# Patient Record
Sex: Male | Born: 1955
Health system: Southern US, Community
[De-identification: ages and names within clinical notes are randomized; demographics above are authoritative.]

## PROBLEM LIST (undated history)

## (undated) DIAGNOSIS — F3289 Other specified depressive episodes: Secondary | ICD-10-CM

## (undated) DIAGNOSIS — E11359 Type 2 diabetes mellitus with proliferative diabetic retinopathy without macular edema: Secondary | ICD-10-CM

## (undated) DIAGNOSIS — F32A Depression, unspecified: Secondary | ICD-10-CM

## (undated) DIAGNOSIS — T401X1A Poisoning by heroin, accidental (unintentional), initial encounter: Secondary | ICD-10-CM

## (undated) DIAGNOSIS — F1910 Other psychoactive substance abuse, uncomplicated: Secondary | ICD-10-CM

## (undated) DIAGNOSIS — I10 Essential (primary) hypertension: Secondary | ICD-10-CM

## (undated) DIAGNOSIS — E119 Type 2 diabetes mellitus without complications: Secondary | ICD-10-CM

## (undated) DIAGNOSIS — F1190 Opioid use, unspecified, uncomplicated: Secondary | ICD-10-CM

## (undated) DIAGNOSIS — F329 Major depressive disorder, single episode, unspecified: Secondary | ICD-10-CM

## (undated) DIAGNOSIS — B1710 Acute hepatitis C without hepatic coma: Secondary | ICD-10-CM

## (undated) DIAGNOSIS — B1920 Unspecified viral hepatitis C without hepatic coma: Secondary | ICD-10-CM

## (undated) DIAGNOSIS — E109 Type 1 diabetes mellitus without complications: Secondary | ICD-10-CM

## (undated) DIAGNOSIS — L80 Vitiligo: Secondary | ICD-10-CM

## (undated) HISTORY — DX: Major depressive disorder, single episode, unspecified: F32.9

## (undated) HISTORY — DX: Type 1 diabetes mellitus without complications: E10.9

## (undated) HISTORY — DX: Essential (primary) hypertension: I10

## (undated) HISTORY — DX: Type 2 diabetes mellitus with proliferative diabetic retinopathy without macular edema: E11.359

## (undated) HISTORY — DX: Vitiligo: L80

## (undated) HISTORY — DX: Other psychoactive substance abuse, uncomplicated: F19.10

## (undated) HISTORY — PX: EYE SURGERY: SHX253

## (undated) HISTORY — DX: Other specified depressive episodes: F32.89

## (undated) HISTORY — DX: Acute hepatitis C without hepatic coma: B17.10

---
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
## 2002-10-06 ENCOUNTER — Ambulatory Visit (HOSPITAL_COMMUNITY): Admission: RE | Admit: 2002-10-06 | Discharge: 2002-10-06 | Payer: Self-pay | Admitting: Gastroenterology

## 2002-10-06 ENCOUNTER — Encounter: Payer: Self-pay | Admitting: Gastroenterology

## 2002-10-18 ENCOUNTER — Ambulatory Visit (HOSPITAL_COMMUNITY): Admission: RE | Admit: 2002-10-18 | Discharge: 2002-10-18 | Payer: Self-pay | Admitting: *Deleted

## 2003-03-14 ENCOUNTER — Ambulatory Visit (HOSPITAL_COMMUNITY): Admission: RE | Admit: 2003-03-14 | Discharge: 2003-03-14 | Payer: Self-pay | Admitting: *Deleted

## 2003-04-15 ENCOUNTER — Emergency Department (HOSPITAL_COMMUNITY): Admission: EM | Admit: 2003-04-15 | Discharge: 2003-04-15 | Payer: Self-pay | Admitting: Emergency Medicine

## 2004-02-08 ENCOUNTER — Ambulatory Visit: Payer: Self-pay | Admitting: Family Medicine

## 2004-02-13 ENCOUNTER — Ambulatory Visit: Payer: Self-pay | Admitting: Family Medicine

## 2004-03-13 ENCOUNTER — Ambulatory Visit: Payer: Self-pay | Admitting: Family Medicine

## 2004-04-17 ENCOUNTER — Ambulatory Visit: Payer: Self-pay | Admitting: Family Medicine

## 2004-05-07 ENCOUNTER — Ambulatory Visit: Payer: Self-pay | Admitting: Family Medicine

## 2004-05-08 ENCOUNTER — Ambulatory Visit: Payer: Self-pay | Admitting: *Deleted

## 2004-05-16 ENCOUNTER — Ambulatory Visit: Payer: Self-pay | Admitting: Family Medicine

## 2004-05-28 ENCOUNTER — Ambulatory Visit: Payer: Self-pay | Admitting: Gastroenterology

## 2004-06-04 ENCOUNTER — Ambulatory Visit: Payer: Self-pay | Admitting: Family Medicine

## 2004-06-15 ENCOUNTER — Ambulatory Visit (HOSPITAL_COMMUNITY): Admission: RE | Admit: 2004-06-15 | Discharge: 2004-06-15 | Payer: Self-pay | Admitting: Gastroenterology

## 2004-06-20 ENCOUNTER — Ambulatory Visit: Payer: Self-pay | Admitting: Family Medicine

## 2004-07-06 ENCOUNTER — Ambulatory Visit: Payer: Self-pay | Admitting: Family Medicine

## 2004-07-10 ENCOUNTER — Emergency Department (HOSPITAL_COMMUNITY): Admission: EM | Admit: 2004-07-10 | Discharge: 2004-07-10 | Payer: Self-pay | Admitting: Emergency Medicine

## 2004-07-13 ENCOUNTER — Ambulatory Visit: Payer: Self-pay | Admitting: Family Medicine

## 2004-07-20 ENCOUNTER — Ambulatory Visit: Payer: Self-pay | Admitting: Family Medicine

## 2004-08-01 ENCOUNTER — Ambulatory Visit: Payer: Self-pay | Admitting: Family Medicine

## 2004-08-27 ENCOUNTER — Ambulatory Visit: Payer: Self-pay | Admitting: Internal Medicine

## 2004-10-18 ENCOUNTER — Ambulatory Visit: Payer: Self-pay | Admitting: Internal Medicine

## 2004-12-15 ENCOUNTER — Emergency Department (HOSPITAL_COMMUNITY): Admission: EM | Admit: 2004-12-15 | Discharge: 2004-12-15 | Payer: Self-pay | Admitting: Emergency Medicine

## 2005-01-17 ENCOUNTER — Ambulatory Visit: Payer: Self-pay | Admitting: Family Medicine

## 2005-02-15 ENCOUNTER — Ambulatory Visit: Payer: Self-pay | Admitting: Gastroenterology

## 2005-02-23 ENCOUNTER — Encounter (INDEPENDENT_AMBULATORY_CARE_PROVIDER_SITE_OTHER): Payer: Self-pay | Admitting: Specialist

## 2005-02-23 ENCOUNTER — Inpatient Hospital Stay (HOSPITAL_COMMUNITY): Admission: EM | Admit: 2005-02-23 | Discharge: 2005-02-28 | Payer: Self-pay | Admitting: Emergency Medicine

## 2005-04-19 ENCOUNTER — Emergency Department (HOSPITAL_COMMUNITY): Admission: EM | Admit: 2005-04-19 | Discharge: 2005-04-19 | Payer: Self-pay | Admitting: Emergency Medicine

## 2005-05-16 ENCOUNTER — Emergency Department (HOSPITAL_COMMUNITY): Admission: EM | Admit: 2005-05-16 | Discharge: 2005-05-16 | Payer: Self-pay | Admitting: Emergency Medicine

## 2005-05-19 ENCOUNTER — Emergency Department (HOSPITAL_COMMUNITY): Admission: EM | Admit: 2005-05-19 | Discharge: 2005-05-19 | Payer: Self-pay | Admitting: Emergency Medicine

## 2005-06-07 ENCOUNTER — Ambulatory Visit: Payer: Self-pay | Admitting: Family Medicine

## 2005-07-29 ENCOUNTER — Ambulatory Visit: Payer: Self-pay | Admitting: Family Medicine

## 2005-08-12 ENCOUNTER — Emergency Department (HOSPITAL_COMMUNITY): Admission: EM | Admit: 2005-08-12 | Discharge: 2005-08-12 | Payer: Self-pay | Admitting: Emergency Medicine

## 2005-09-25 ENCOUNTER — Ambulatory Visit: Payer: Self-pay | Admitting: Family Medicine

## 2005-11-15 ENCOUNTER — Ambulatory Visit: Payer: Self-pay | Admitting: Gastroenterology

## 2005-11-19 ENCOUNTER — Ambulatory Visit: Payer: Self-pay | Admitting: Family Medicine

## 2005-12-13 ENCOUNTER — Ambulatory Visit: Payer: Self-pay | Admitting: Endocrinology

## 2006-01-03 ENCOUNTER — Ambulatory Visit: Payer: Self-pay | Admitting: Endocrinology

## 2006-02-20 ENCOUNTER — Ambulatory Visit: Payer: Self-pay | Admitting: Endocrinology

## 2006-03-04 ENCOUNTER — Ambulatory Visit: Payer: Self-pay | Admitting: Gastroenterology

## 2006-05-22 ENCOUNTER — Ambulatory Visit: Payer: Self-pay | Admitting: Endocrinology

## 2006-06-24 ENCOUNTER — Emergency Department (HOSPITAL_COMMUNITY): Admission: EM | Admit: 2006-06-24 | Discharge: 2006-06-24 | Payer: Self-pay | Admitting: Emergency Medicine

## 2006-09-25 ENCOUNTER — Ambulatory Visit: Payer: Self-pay | Admitting: Endocrinology

## 2006-09-30 ENCOUNTER — Ambulatory Visit: Payer: Self-pay | Admitting: Gastroenterology

## 2006-11-11 ENCOUNTER — Encounter: Payer: Self-pay | Admitting: Endocrinology

## 2006-11-11 DIAGNOSIS — E109 Type 1 diabetes mellitus without complications: Secondary | ICD-10-CM

## 2006-11-11 DIAGNOSIS — F329 Major depressive disorder, single episode, unspecified: Secondary | ICD-10-CM

## 2006-11-19 ENCOUNTER — Ambulatory Visit: Payer: Self-pay | Admitting: Endocrinology

## 2006-11-19 LAB — CONVERTED CEMR LAB
BUN: 10 mg/dL (ref 6–23)
CO2: 36 meq/L — ABNORMAL HIGH (ref 19–32)
Calcium: 10.3 mg/dL (ref 8.4–10.5)
Chloride: 107 meq/L (ref 96–112)
Cholesterol: 163 mg/dL (ref 0–200)
Creatinine, Ser: 0.7 mg/dL (ref 0.4–1.5)
GFR calc Af Amer: 154 mL/min
GFR calc non Af Amer: 127 mL/min
Glucose, Bld: 113 mg/dL — ABNORMAL HIGH (ref 70–99)
HDL: 56.4 mg/dL (ref >39.0)
Hgb A1c MFr Bld: 8.4 % — ABNORMAL HIGH (ref 4.6–6.0)
LDL Cholesterol: 98 mg/dL (ref 0–99)
PSA: 0.36 ng/mL (ref 0.10–4.00)
Potassium: 5 meq/L (ref 3.5–5.1)
Sodium: 145 meq/L (ref 135–145)
TSH: 2.34 microintl units/mL (ref 0.35–5.50)
Total CHOL/HDL Ratio: 2.9
Triglycerides: 44 mg/dL (ref 0–149)
VLDL: 9 mg/dL (ref 0–40)

## 2007-04-06 ENCOUNTER — Ambulatory Visit: Payer: Self-pay | Admitting: Internal Medicine

## 2007-04-23 ENCOUNTER — Ambulatory Visit: Payer: Self-pay | Admitting: Endocrinology

## 2007-04-23 DIAGNOSIS — F1910 Other psychoactive substance abuse, uncomplicated: Secondary | ICD-10-CM

## 2007-06-04 ENCOUNTER — Encounter: Payer: Self-pay | Admitting: Endocrinology

## 2007-06-28 ENCOUNTER — Emergency Department (HOSPITAL_COMMUNITY): Admission: EM | Admit: 2007-06-28 | Discharge: 2007-06-28 | Payer: Self-pay | Admitting: Emergency Medicine

## 2007-07-16 ENCOUNTER — Ambulatory Visit: Payer: Self-pay | Admitting: Endocrinology

## 2007-07-16 DIAGNOSIS — Z9119 Patient's noncompliance with other medical treatment and regimen: Secondary | ICD-10-CM

## 2007-07-16 LAB — CONVERTED CEMR LAB: Hgb A1c MFr Bld: 10.7 % — ABNORMAL HIGH (ref 4.6–6.0)

## 2007-07-20 ENCOUNTER — Emergency Department (HOSPITAL_COMMUNITY): Admission: EM | Admit: 2007-07-20 | Discharge: 2007-07-20 | Payer: Self-pay | Admitting: Emergency Medicine

## 2007-10-15 ENCOUNTER — Encounter: Payer: Self-pay | Admitting: Endocrinology

## 2007-10-27 ENCOUNTER — Emergency Department (HOSPITAL_COMMUNITY): Admission: EM | Admit: 2007-10-27 | Discharge: 2007-10-27 | Payer: Self-pay | Admitting: Emergency Medicine

## 2008-02-25 ENCOUNTER — Emergency Department (HOSPITAL_COMMUNITY): Admission: EM | Admit: 2008-02-25 | Discharge: 2008-02-25 | Payer: Self-pay | Admitting: Emergency Medicine

## 2008-04-11 ENCOUNTER — Emergency Department (HOSPITAL_COMMUNITY): Admission: EM | Admit: 2008-04-11 | Discharge: 2008-04-11 | Payer: Self-pay | Admitting: Emergency Medicine

## 2008-11-22 ENCOUNTER — Ambulatory Visit: Payer: Self-pay | Admitting: Endocrinology

## 2008-11-22 DIAGNOSIS — B182 Chronic viral hepatitis C: Secondary | ICD-10-CM | POA: Insufficient documentation

## 2008-11-22 DIAGNOSIS — R809 Proteinuria, unspecified: Secondary | ICD-10-CM | POA: Insufficient documentation

## 2008-11-22 DIAGNOSIS — L80 Vitiligo: Secondary | ICD-10-CM

## 2008-11-22 LAB — CONVERTED CEMR LAB
Creatinine,U: 163.8 mg/dL
Hgb A1c MFr Bld: 8.4 % — ABNORMAL HIGH (ref 4.6–6.5)
Microalb Creat Ratio: 238.1 mg/g — ABNORMAL HIGH (ref 0.0–30.0)
Microalb, Ur: 39 mg/dL — ABNORMAL HIGH (ref 0.0–1.9)

## 2008-11-25 ENCOUNTER — Encounter: Payer: Self-pay | Admitting: Endocrinology

## 2009-02-09 ENCOUNTER — Ambulatory Visit: Payer: Self-pay | Admitting: Internal Medicine

## 2009-02-09 ENCOUNTER — Encounter: Payer: Self-pay | Admitting: Family Medicine

## 2009-02-09 LAB — CONVERTED CEMR LAB: Microalb, Ur: 10.83 mg/dL — ABNORMAL HIGH (ref 0.00–1.89)

## 2009-04-13 ENCOUNTER — Encounter: Payer: Self-pay | Admitting: Family Medicine

## 2009-04-13 ENCOUNTER — Ambulatory Visit: Payer: Self-pay | Admitting: Internal Medicine

## 2009-04-13 LAB — CONVERTED CEMR LAB
ALT: 73 units/L — ABNORMAL HIGH (ref 0–53)
AST: 59 units/L — ABNORMAL HIGH (ref 0–37)
Albumin: 4.1 g/dL (ref 3.5–5.2)
Alkaline Phosphatase: 82 units/L (ref 39–117)
Amphetamine Screen, Ur: NEGATIVE
BUN: 15 mg/dL (ref 6–23)
Barbiturate Quant, Ur: NEGATIVE
Basophils Absolute: 0.1 10*3/uL (ref 0.0–0.1)
Basophils Relative: 2 % — ABNORMAL HIGH (ref 0–1)
Benzodiazepines.: NEGATIVE
CO2: 23 meq/L (ref 19–32)
Calcium: 9.5 mg/dL (ref 8.4–10.5)
Chloride: 105 meq/L (ref 96–112)
Cholesterol: 194 mg/dL (ref 0–200)
Cocaine Metabolites: POSITIVE — AB
Creatinine, Ser: 0.89 mg/dL (ref 0.40–1.50)
Creatinine,U: 131.2 mg/dL
Eosinophils Absolute: 0.5 10*3/uL (ref 0.0–0.7)
Eosinophils Relative: 6 % — ABNORMAL HIGH (ref 0–5)
Glucose, Bld: 67 mg/dL — ABNORMAL LOW (ref 70–99)
HCT: 48 % (ref 39.0–52.0)
HDL: 55 mg/dL (ref >39)
Hemoglobin: 17.2 g/dL — ABNORMAL HIGH (ref 13.0–17.0)
LDL Cholesterol: 122 mg/dL — ABNORMAL HIGH (ref 0–99)
Lymphocytes Relative: 32 % (ref 12–46)
Lymphs Abs: 2.5 10*3/uL (ref 0.7–4.0)
MCHC: 35.8 g/dL (ref 30.0–36.0)
MCV: 93.4 fL (ref 78.0–100.0)
Marijuana Metabolite: NEGATIVE
Methadone: NEGATIVE
Microalb, Ur: 14.12 mg/dL — ABNORMAL HIGH (ref 0.00–1.89)
Monocytes Absolute: 0.6 10*3/uL (ref 0.1–1.0)
Monocytes Relative: 8 % (ref 3–12)
Neutro Abs: 4 10*3/uL (ref 1.7–7.7)
Neutrophils Relative %: 52 % (ref 43–77)
Opiate Screen, Urine: NEGATIVE
Phencyclidine (PCP): NEGATIVE
Platelets: 299 10*3/uL (ref 150–400)
Potassium: 4.6 meq/L (ref 3.5–5.3)
Propoxyphene: NEGATIVE
RBC: 5.14 M/uL (ref 4.22–5.81)
RDW: 13.3 % (ref 11.5–15.5)
Sodium: 141 meq/L (ref 135–145)
TSH: 3.094 microintl units/mL (ref 0.350–4.500)
Total Bilirubin: 0.8 mg/dL (ref 0.3–1.2)
Total CHOL/HDL Ratio: 3.5
Total Protein: 7.1 g/dL (ref 6.0–8.3)
Triglycerides: 86 mg/dL (ref <150)
VLDL: 17 mg/dL (ref 0–40)
WBC: 7.7 10*3/uL (ref 4.0–10.5)

## 2009-05-03 ENCOUNTER — Emergency Department (HOSPITAL_COMMUNITY): Admission: EM | Admit: 2009-05-03 | Discharge: 2009-05-03 | Payer: Self-pay | Admitting: Emergency Medicine

## 2009-05-04 ENCOUNTER — Ambulatory Visit: Payer: Self-pay | Admitting: Internal Medicine

## 2009-05-05 ENCOUNTER — Ambulatory Visit: Payer: Self-pay | Admitting: Internal Medicine

## 2009-05-05 LAB — CONVERTED CEMR LAB
Amphetamine Screen, Ur: NEGATIVE
Barbiturate Quant, Ur: NEGATIVE
Benzodiazepines.: NEGATIVE
Cholesterol: 178 mg/dL (ref 0–200)
Cocaine Metabolites: POSITIVE — AB
Creatinine,U: 74.8 mg/dL
HDL: 56 mg/dL (ref >39)
LDL Cholesterol: 107 mg/dL — ABNORMAL HIGH (ref 0–99)
Marijuana Metabolite: NEGATIVE
Methadone: NEGATIVE
Opiate Screen, Urine: NEGATIVE
Phencyclidine (PCP): NEGATIVE
Propoxyphene: NEGATIVE
Total CHOL/HDL Ratio: 3.2
Triglycerides: 76 mg/dL (ref <150)
VLDL: 15 mg/dL (ref 0–40)

## 2009-05-24 ENCOUNTER — Ambulatory Visit: Payer: Self-pay | Admitting: Internal Medicine

## 2009-10-19 ENCOUNTER — Emergency Department (HOSPITAL_COMMUNITY)
Admission: EM | Admit: 2009-10-19 | Discharge: 2009-10-19 | Payer: Self-pay | Source: Home / Self Care | Admitting: Emergency Medicine

## 2009-10-21 ENCOUNTER — Emergency Department (HOSPITAL_COMMUNITY): Admission: EM | Admit: 2009-10-21 | Discharge: 2009-10-21 | Payer: Self-pay | Admitting: Emergency Medicine

## 2009-11-20 ENCOUNTER — Ambulatory Visit: Payer: Self-pay | Admitting: Psychiatry

## 2009-11-20 ENCOUNTER — Other Ambulatory Visit: Payer: Self-pay

## 2009-11-20 ENCOUNTER — Other Ambulatory Visit: Payer: Self-pay | Admitting: Emergency Medicine

## 2009-11-20 ENCOUNTER — Inpatient Hospital Stay (HOSPITAL_COMMUNITY): Admission: RE | Admit: 2009-11-20 | Discharge: 2009-11-24 | Payer: Self-pay | Admitting: Psychiatry

## 2010-01-11 ENCOUNTER — Telehealth: Payer: Self-pay | Admitting: Internal Medicine

## 2010-01-11 ENCOUNTER — Ambulatory Visit: Payer: Self-pay | Admitting: Internal Medicine

## 2010-01-11 LAB — CONVERTED CEMR LAB
ALT: 38 units/L (ref 0–53)
AST: 33 units/L (ref 0–37)
Albumin: 3.8 g/dL (ref 3.5–5.2)
Alkaline Phosphatase: 77 units/L (ref 39–117)
BUN: 13 mg/dL (ref 6–23)
Bilirubin, Direct: 0.2 mg/dL (ref 0.0–0.3)
CO2: 28 meq/L (ref 19–32)
Calcium: 9.5 mg/dL (ref 8.4–10.5)
Chloride: 103 meq/L (ref 96–112)
Cholesterol: 188 mg/dL (ref 0–200)
Creatinine, Ser: 0.9 mg/dL (ref 0.4–1.5)
Creatinine,U: 88 mg/dL
GFR calc non Af Amer: 96.18 mL/min (ref >60)
Glucose, Bld: 360 mg/dL — ABNORMAL HIGH (ref 70–99)
HDL: 71.2 mg/dL (ref >39.00)
Hgb A1c MFr Bld: 8.9 % — ABNORMAL HIGH (ref 4.6–6.5)
LDL Cholesterol: 104 mg/dL — ABNORMAL HIGH (ref 0–99)
Microalb Creat Ratio: 4.4 mg/g (ref 0.0–30.0)
Microalb, Ur: 3.9 mg/dL — ABNORMAL HIGH (ref 0.0–1.9)
Potassium: 5 meq/L (ref 3.5–5.1)
Sodium: 136 meq/L (ref 135–145)
Total Bilirubin: 1.2 mg/dL (ref 0.3–1.2)
Total CHOL/HDL Ratio: 3
Total Protein: 7.1 g/dL (ref 6.0–8.3)
Triglycerides: 64 mg/dL (ref 0.0–149.0)
VLDL: 12.8 mg/dL (ref 0.0–40.0)

## 2010-01-11 LAB — HM DIABETES FOOT EXAM

## 2010-01-29 ENCOUNTER — Telehealth: Payer: Self-pay | Admitting: Internal Medicine

## 2010-01-30 ENCOUNTER — Ambulatory Visit: Payer: Self-pay | Admitting: Internal Medicine

## 2010-01-30 ENCOUNTER — Ambulatory Visit: Payer: Self-pay | Admitting: Endocrinology

## 2010-01-30 DIAGNOSIS — L919 Hypertrophic disorder of the skin, unspecified: Secondary | ICD-10-CM

## 2010-01-30 DIAGNOSIS — L909 Atrophic disorder of skin, unspecified: Secondary | ICD-10-CM | POA: Insufficient documentation

## 2010-02-16 ENCOUNTER — Encounter: Payer: Self-pay | Admitting: Internal Medicine

## 2010-02-16 ENCOUNTER — Encounter: Payer: Self-pay | Admitting: Endocrinology

## 2010-03-03 ENCOUNTER — Emergency Department (HOSPITAL_COMMUNITY): Admission: EM | Admit: 2010-03-03 | Discharge: 2010-03-03 | Payer: Self-pay | Admitting: Emergency Medicine

## 2010-04-10 ENCOUNTER — Emergency Department (HOSPITAL_COMMUNITY)
Admission: EM | Admit: 2010-04-10 | Discharge: 2010-04-10 | Payer: Self-pay | Source: Home / Self Care | Admitting: Emergency Medicine

## 2010-05-15 NOTE — Assessment & Plan Note (Signed)
Summary: MOLE ON LEFT EYE-LB   Vital Signs:  Patient profile:   55 year old male Height:      66 inches (167.64 cm) Weight:      161 pounds (73.18 kg) O2 Sat:      96 % on Room air Temp:     97.2 degrees F (36.22 degrees C) oral Pulse rate:   88 / minute BP sitting:   120 / 62  (left arm) Cuff size:   regular  Vitals Entered By: Tomma Lightning, RMA  O2 Flow:  Room air CC: Mole on (L) eye Is Patient Diabetic? Yes Did you bring your meter with you today? No Pain Assessment Patient in pain? no        Primary Care Provider:  Rowe Clack MD  CC:  Mole on (L) eye.  History of Present Illness: here for growth on left upper eyelid onset 4 months ago ?slow increase in size - no pain or eyelid swelling wants it removed -  reviewed chronic med issues: 1) DM1 - dx age 26y - on insulin tx since dx - noncompliant with check cbgs due to cost of strips - denies symptoms hypoglycemia - reports compliance with ongoing medical treatment and no changes in medication dose or frequency. denies adverse side effects related to current therapy.  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 20 Units Each Am Qnd Sliding Scale Ac  Three Times A Day 2)  Lantus Solostar 100 Unit/ml  Soln (Insulin Glargine) .... 27 Units Subcutaneously Every Evening 3)  Onetouch Ultra Test  Strp  (Glucose Blood) .... 4 Times A Day 4)  Lisinopril 10 Mg Tabs (Lisinopril) .[name].. 1 By Mouth Once Daily  Allergies (verified): 1)  ! Codeine  Past History:  Past Medical History: Hepatitis C chronic - declines interferon DM Perpheral Painful Neuropathy DM Prolif Retinopathy s/p laser x 2 Glaucoma Vitiligo ongoing DRUG ABUSE (pt should have NO controlled substances rx'ed)  DIABETES MELLITUS, TYPE I DEPRESSION  Md roster: [facility]o - [facility]   Review of Systems  The patient denies fever, vision loss, and headaches.    Physical Exam  General:  thin, alert, well-developed, well-nourished, and cooperative to examination.    Eyes:  skin tag or cutaneous horn (8mm) on left upper eyelid -  Skin:  see "eye" above, vitaligo changes as prior   Impression & Recommendations:  Problem # 1:  SKIN TAG (ICD-701.9) due to location on upper eyelid, i declined to remove - also discussed with my partners who agree to not intervene for same i reassured pt re: cosmetic issue only, no health issue pt still wants removed - i rec optho - has upcoming appt 11/4 for DM eye check already scheduled  Complete Medication List: 1)  Humalog Claiborne Rigg  100 Unit/ml Soln (Insulin lispro (human)) .... 20 units each am qnd sliding scale ac  three times a day 2)  Lantus Solostar 100 Unit/ml Soln (Insulin glargine) .... 27 units subcutaneously every evening 3)  Onetouch Ultra Test Strp (Glucose blood) .... 4 times a day 4)  Lisinopril 10 Mg Tabs (Lisinopril) .[name].. 1 by mouth once daily  Patient Instructions: 1)  it was good to see you today. 2)  no one here is comfortable with removal of the skin tag from your eyelid - discuss with dr. Herbert Deaner at your visit with her 02/16/10 at 215pm   Orders Added: 1)  Est. Patient Level III [id number]

## 2010-05-15 NOTE — Progress Notes (Signed)
Summary: FYI - no home phone (temp)  Phone Note Call from Patient   Caller: Patient Details for Reason: FYI Summary of Call: Pt states at this time does'nt have home number. In the process of moving. Will call back today to give [place] a correct contact #. Initial call taken by: Tomma Lightning RMA,  January 11, 2010 10:30 AM  Follow-up for Phone Call        ok - noted - thanks Follow-up by: Rowe Clack MD,  January 11, 2010 11:27 AM

## 2010-05-15 NOTE — Letter (Signed)
Summary: [facility]  [facility]   Imported By: Bubba Hales 03/01/2010 10:19:56  _____________________________________________________________________  External Attachment:    Type:   Image     Comment:   External Document

## 2010-05-15 NOTE — Progress Notes (Signed)
Summary: ophthalmology referral  Phone Note Outgoing Call   Summary of Call: Dr Asa Lente , have been unable to reach pt, has called [facility] number several times, pt no longer stay at that number but have left msg with person. See phone note dated 01/11/10.  mary phillips  Follow-up for Phone Call        ok - noted - thanks Follow-up by: Rowe Clack MD,  January 29, 2010 12:21 PM

## 2010-05-15 NOTE — Letter (Signed)
Summary: Eye exam/[facility]  Eye exam/[facility]   Imported By: Phillis Knack 03/07/2010 12:10:33  _____________________________________________________________________  External Attachment:    Type:   Image     Comment:   External Document

## 2010-05-15 NOTE — Assessment & Plan Note (Signed)
Summary: flu vac  sae--stc  Nurse Visit   Allergies: 1)  ! Codeine  Orders Added: 1)  Flu Vaccine 56yrs + MEDICARE PATIENTS [Q2039] 2)  Administration Flu vaccine - MCR [id number] Flu Vaccine Consent Questions     Do you have a history of severe allergic reactions to this vaccine? no    Any prior history of allergic reactions to egg and/or gelatin? no    Do you have a sensitivity to the preservative Thimersol? no    Do you have a past history of Guillan-Barre Syndrome? no    Do you currently have an acute febrile illness? no    Have you ever had a severe reaction to latex? no    Vaccine information given and explained to patient? yes    Are you currently pregnant? no    Lot Number:[id number]   Exp Date:10/13/2010   Site Given  Left Deltoid IM

## 2010-05-15 NOTE — Assessment & Plan Note (Signed)
Summary: new / medicare (per pt) cd   Vital Signs:  Patient profile:   55 year old male Height:      66 inches (167.64 cm) Weight:      161.0 pounds (73.18 kg) BMI:     26.08 O2 Sat:      97 % on Room air Temp:     97.7 degrees F (36.50 degrees C) oral Pulse rate:   82 / minute BP sitting:   110 / 58  (left arm) Cuff size:   regular  Vitals Entered By: Tomma Lightning RMA (January 11, 2010 9:28 AM)  O2 Flow:  Room air CC: New patient Is Patient Diabetic? Yes Did you bring your meter with you today? No Pain Assessment Patient in pain? no      Comments Need referral to continue seeing Dr. Loanne Drilling for his diabetes. Pt states need refill on diabetic supplies   [facility] Provider[facility]  CC:  New patient.  History of Present Illness: new pt to me, but known to our practice ([facility]) from prior endo eval followed at [facility] until recently, now transfer care  1) DM1 - dx age 70y - on insulin tx since dx - noncompliant with check cbgs due to cost of strips - denies symptoms hypoglycemia - reports compliance with ongoing medical treatment and no changes in medication dose or frequency. denies adverse side effects related to current therapy.  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 - considered interferon tx but declined due to +UDS - now, pt declines due to depression hx and risk of same on tx  4) chronic depression - intol of meds for same - has good insight into his condition and triggers - deneis unexplainable sadness at this time and no SI/HI  Preventive Screening-Counseling & Management  Alcohol-Tobacco     Alcohol drinks/day: <1     Alcohol Counseling: to STOP drinking     Smoking Status: current     Tobacco  Counseling: to quit use of tobacco products  Caffeine-Diet-Exercise     Diet Counseling: to improve diet; diet is suboptimal     Depression Counseling: not indicated; screening negative for depression  Safety-Violence-Falls     Seat Belt Counseling: not applicable     Helmet Counseling: not applicable     Firearms in the Home: no firearms in the home     Violence Counseling: not indicated; no violence risk noted     Fall Risk Counseling: not indicated; no significant falls noted  Clinical Review Panels:  Lipid Management   Cholesterol:  178 (05/05/2009)   LDL (bad choesterol):  107 (05/05/2009)   HDL (good cholesterol):  56 (05/05/2009)  Diabetes Management   HgBA1C:  8.4 (11/22/2008)   Creatinine:  0.89 (04/13/2009)   Last Foot Exam:  yes (01/11/2010)  CBC   WBC:  7.7 (04/13/2009)   RBC:  5.14 (04/13/2009)   Hgb:  17.2 (04/13/2009)   Hct:  48.0 (04/13/2009)   Platelets:  299 (04/13/2009)   MCV  93.4 (04/13/2009)   MCHC  35.8 (04/13/2009)   RDW  13.3 (04/13/2009)   PMN:  52 (04/13/2009)   Lymphs:  32 (04/13/2009)  Monos:  8 (04/13/2009)   Eosinophils:  6 (04/13/2009)   Basophil:  2 (04/13/2009)  Complete Metabolic Panel   Glucose:  67 (04/13/2009)   Sodium:  141 (04/13/2009)   Potassium:  4.6 (04/13/2009)   Chloride:  105 (04/13/2009)   CO2:  23 (04/13/2009)   BUN:  15 (04/13/2009)   Creatinine:  0.89 (04/13/2009)   Albumin:  4.1 (04/13/2009)   Total Protein:  7.1 (04/13/2009)   Calcium:  9.5 (04/13/2009)   Total Bili:  0.8 (04/13/2009)   Alk Phos:  82 (04/13/2009)   SGPT (ALT):  73 (04/13/2009)   SGOT (AST):  59 (04/13/2009)   Current Medications (verified): 1)  Humalog Kwikpen 100 Unit/ml  Soln (Insulin Lispro (Human)) .... Sliding Scale 2)  Lantus Solostar 100 Unit/ml  Soln (Insulin Glargine) .... 27 Units Q Am 3)  Insulin Syringe 29g X 1" 0.3 Ml  Misc (Insulin Syringe-Needle U-100) .... Use As Directed 4)  Onetouch Ultra Test  Strp (Glucose Blood)  .... 4 Times A Day 5)  Pen Needles 5/16" 31g X 8 Mm Misc (Insulin Pen Needle) .... Qid  Allergies (verified): 1)  ! Codeine  Past History:  Past Medical History: Hepatitis C chronic - declines interferon DM Perpheral Painful Neuropathy DM Prolif Retinopathy s/p laser x 2 Glaucoma Vitiligo ongoing DRUG ABUSE (pt should have NO controlled substances rx'ed)  DIABETES MELLITUS, TYPE I DEPRESSION  Md roster: [facility]o - [facility]  Past Surgical History: retinal surg x 2, right eye  Family History: mom - 68y - OA, CAD dad expired age 83y due to ESRD/DM   Social History: disabled, prev mental health drug counselor single, lives in boarding house - moving to apt with friend 01/2010 current smoking 1/2-2ppd ongoing occ drug use - crack - s/p rehab x6  Review of Systems       see HPI above. I have reviewed all other systems and they were negative.   Physical Exam  General:  thin, alert, well-developed, well-nourished, and cooperative to examination.    Eyes:  vision grossly intact; pupils equal, round and reactive to light.  conjunctiva and lids normal.    Ears:  normal pinnae bilaterally, without erythema, swelling, or tenderness to palpation. TMs clear, no cerumen impaction. Hearing grossly normal bilaterally  Mouth:  poor dentition, OP clear Lungs:  normal respiratory effort, no intercostal retractions or use of accessory muscles; normal breath sounds bilaterally - no crackles and no wheezes.    Heart:  normal rate, regular rhythm, no murmur, and no rub. BLE without edema. normal DP pulses and normal cap refill in all 4 extremities    Abdomen:  soft, non-tender, normal bowel sounds, no distention; no masses and no appreciable hepatomegaly or splenomegaly.   Msk:  No deformity or scoliosis noted of thoracic or lumbar spine.   Neurologic:  alert & oriented X3 and cranial nerves II-XII symetrically intact.  strength normal in all extremities, sensation intact to light  touch, and gait normal. speech fluent without dysarthria or aphasia; follows commands with good comprehension.  Psych:  Oriented X3, memory intact for recent and remote, normally interactive, good eye contact, not anxious appearing, not depressed appearing, and not agitated.     Diabetes Management Exam:    Foot Exam (with socks and/or shoes not present):       Sensory-Pinprick/Light touch:          Left medial foot (L-4): diminished          Left dorsal  foot (L-5): diminished          Left lateral foot (S-1): diminished          Right medial foot (L-4): diminished          Right dorsal foot (L-5): diminished          Right lateral foot (S-1): diminished       Sensory-Monofilament:          Left foot: diminished          Right foot: diminished       Inspection:          Left foot: normal          Right foot: normal       Nails:          Left foot: normal          Right foot: normal   Impression & Recommendations:  Problem # 1:  DIABETES MELLITUS, TYPE I (ICD-250.01)  The following medications were removed from the medication list:    Lantus 100 Unit/ml Soln (Insulin glargine) .[name]... 25 qhs His updated medication list for this problem includes:    Humalog Kwikpen 100 Unit/ml Soln (Insulin lispro (human)) .[name]... 20 units each am qnd sliding scale ac  three times a day    Lantus Solostar 100 Unit/ml Soln (Insulin glargine) .[name]... 27 units subcutaneously every evening  Orders: TLB-A1C / Hgb A1C (Glycohemoglobin) (83036-A1C) TLB-Microalbumin/Creat Ratio, Urine (82043-MALB) TLB-BMP (Basic Metabolic Panel-BMET) (99991111) Ophthalmology Referral (Ophthalmology) TLB-Lipid Panel (80061-LIPID) Endocrinology Referral (Endocrine) Prescription Created Electronically [phone])  Labs Reviewed: Creat: 0.89 (04/13/2009)    Reviewed HgBA1c results: 8.4 (11/22/2008)  10.7 (07/16/2007)  Problem # 2:  PROTEINURIA, MILD (ICD-791.0) consider ACEI/ARB depending on urine results  Problem # 3:   DRUG ABUSE (ICD-305.90)  ongoing, intermittent  Problem # 4:  HEPATITIS C (ICD-070.51)  Orders: TLB-Hepatic/Liver Function Pnl (80076-HEPATIC)  he was refused interferon when he refused a drug test prev,  now declines tx due to depression hx and risk same on tx  Problem # 5:  VITILIGO (ICD-709.01)  Problem # 6:  DEPRESSION (ICD-311)  Complete Medication List: 1)  Humalog Kwikpen 100 Unit/ml Soln (Insulin lispro (human)) .... 20 units each am qnd sliding scale ac  three times a day 2)  Lantus Solostar 100 Unit/ml Soln (Insulin glargine) .... 27 units subcutaneously every evening 3)  Onetouch Ultra Test Strp (Glucose blood) .... 4 times a day  Patient Instructions: 1)  it was good to see you today. 2)  we'll make referral to dr. Loanne Drilling and dr. Herbert Deaner as discussed . Our office will contact you regarding these appointments once made.  3)  refills on insulin as discussed 4)  test(s) ordered today - your results will be posted on the phone tree for review in 48-72 hours from the time of test completion; call [phone] and enter your 9 digit MRN (listed above on this page, just below your name); if any changes need to be made or there are abnormal results, you will be contacted directly.  5)  Please schedule a follow-up appointment in 6 months, sooner if problems.  Prescriptions: LANTUS SOLOSTAR 100 UNIT/ML  SOLN (INSULIN GLARGINE) 27 units subcutaneously every evening  #2 x 6   Entered and Authorized by:   Rowe Clack MD   Signed by:   Rowe Clack MD on 01/11/2010   Method used:   Electronically to        [facility] (retail)       [place], [place]  [address]       Ph: [phone]       Fax: [phone]   RxID:   [id number] HUMALOG KWIKPEN 100 UNIT/ML  SOLN (INSULIN LISPRO (HUMAN)) 20 units each AM qnd sliding scale ac  three times a day  #2 x 6   Entered and Authorized by:   Rowe Clack MD   Signed by:   Rowe Clack MD on 01/11/2010   Method used:   Electronically to        [id number] (retail)       [place], [place]  [address]       Ph: [phone]       Fax: [phone]   RxID:   [id number]

## 2010-06-04 ENCOUNTER — Telehealth: Payer: Self-pay | Admitting: Endocrinology

## 2010-06-07 ENCOUNTER — Ambulatory Visit (INDEPENDENT_AMBULATORY_CARE_PROVIDER_SITE_OTHER): Payer: Medicare Other | Admitting: Endocrinology

## 2010-06-07 ENCOUNTER — Encounter: Payer: Self-pay | Admitting: Endocrinology

## 2010-06-07 DIAGNOSIS — E109 Type 1 diabetes mellitus without complications: Secondary | ICD-10-CM

## 2010-06-12 NOTE — Progress Notes (Signed)
Summary: med question  Phone Note Call from Patient   Caller: Patient Reason for Call: Talk to Doctor Summary of Call: Insurance will not cover HUMALOG KWIKPEN 100 UNIT/ML, do you want to switch him to Novolog(insurance will cover), please call into [facility] on [address], pt will call back to check on this, he does not have a phone Initial call taken by: Darnell Level,  June 04, 2010 10:15 AM  Follow-up for Phone Call        was last seen here 1 1/2 years ago.  we can address then. Follow-up by: Donavan Foil MD,  June 04, 2010 11:15 AM

## 2010-06-12 NOTE — Assessment & Plan Note (Signed)
Summary: DISCUSS MEDS PER PHONE NOTE/NWS   Vital Signs:  Patient profile:   55 year old male Height:      66 inches (167.64 cm) Weight:      177.13 pounds (80.51 kg) BMI:     28.69 O2 Sat:      95 % on Room air Temp:     98.2 degrees F (36.78 degrees C) oral Pulse rate:   84 / minute BP sitting:   92 / 54  (left arm) Cuff size:   regular  Vitals Entered By: Rebeca Alert CMA Deborra Medina) (June 07, 2010 10:03 AM)  O2 Flow:  Room air CC: Follow up on DM/Discuss insulin/aj Is Patient Diabetic? Yes Comments pt has never taken Lisinopril   Primary Provider:  Rowe Clack MD  CC:  Follow up on DM/Discuss insulin/aj.  History of Present Illness: his meter broke, so he hasn't been able to check cbg's.  he says 'i go by how i feel."  he says "i need to start going by how my blood sugar is instead."  yesterday, he had mild hypoglycemia after breakfast.  insurance now prefers novolog.    Current Medications (verified): 1)  Humalog Kwikpen 100 Unit/ml  Soln (Insulin Lispro (Human)) .... 20 Units Each Am Qnd Sliding Scale Ac  Three Times A Day 2)  Lantus Solostar 100 Unit/ml  Soln (Insulin Glargine) .... 27 Units Subcutaneously Every Evening 3)  Onetouch Ultra Test  Strp (Glucose Blood) .... 4 Times A Day 4)  Lisinopril 10 Mg Tabs (Lisinopril) .[name].. 1 By Mouth Once Daily 5)  Bd Pen Needle Short U/f 31g X 8 Mm Misc (Insulin Pen Needle) .... Use As Directed Dx 250.01  Allergies (verified): 1)  ! Codeine  Past History:  Past Medical History: Last updated: 01/30/2010 Hepatitis C chronic - declines interferon DM Perpheral Painful Neuropathy DM Prolif Retinopathy s/p laser x 2 Glaucoma Vitiligo ongoing DRUG ABUSE (pt should have NO controlled substances rx'ed)  DIABETES MELLITUS, TYPE I DEPRESSION  Md roster: [facility]o - [facility]   Review of Systems  The patient denies syncope.    Physical Exam  General:  Well developed, well nourished, in no acute distress.    Pulses:  dorsalis pedis intact bilat.  Extremities:  no deformity.  no ulcer on the feet.  feet are of normal color and temp.  no edema mycotic toenails.   Neurologic:  sensation is intact to touch on the feet, but severely decreased form normal    Impression & Recommendations:  Problem # 1:  DIABETES MELLITUS, TYPE I (ICD-250.01) i agree with the need to check cbg's more.    Medications Added to Medication List This Visit: 1)  Novolog Flexpen 100 Unit/ml Soln (Insulin aspart) .... Three times a day (just before each meal) 8-4-8 units, and pen needles 4x a day.  Other Orders: Est. Patient Level III [id number])  Patient Instructions: 1)  check your blood sugar 4 times a day--before the 3 meals, and at bedtime.  also check if you have symptoms of your blood sugar being too high or too low.  please keep a record of the readings and bring it to your next appointment here.  please call us sooner if you are having low blood sugar episodes. 2)  change humalog to novolog three times a day (just before each meal) 8-4-8 units. 3)  Please schedule a follow-up appointment in 2 weeks. 4)  good diet and exercise habits significanly improve the control of  your diabetes.  please let me know if you wish to be referred to a dietician.  high blood sugar is very risky to your health.  you should see an eye doctor every year. 5)  controlling your blood pressure and cholesterol drastically reduces the damage diabetes does to your body.  this also applies to quitting smoking.  please discuss these with your doctor.  you should take an aspirin every day, unless you have been advised by a doctor not to.   Prescriptions: NOVOLOG FLEXPEN 100 UNIT/ML SOLN (INSULIN ASPART) three times a day (just before each meal) 8-4-8 units, and pen needles 4x a day.  #1 box x 11   Entered and Authorized by:   Donavan Foil MD   Signed by:   Donavan Foil MD on 06/07/2010   Method used:   Electronically to        [id number] (retail)       [place], [place]  [address]       Ph: [phone]       Fax: [phone]   RxID:   [id number] Donald Siva TEST  STRP (GLUCOSE BLOOD) 4 times a day  #120 x 11   Entered and Authorized by:   Donavan Foil MD   Signed by:   Donavan Foil MD on 06/07/2010   Method used:   Electronically to        [id number] (retail)       [place], [place]  [address]       Ph: [phone]       Fax: [phone]   RxID:   [id number]    Orders Added: 1)  Est. Patient Level III [id number]

## 2010-06-21 ENCOUNTER — Encounter: Payer: Self-pay | Admitting: Endocrinology

## 2010-06-21 ENCOUNTER — Ambulatory Visit (INDEPENDENT_AMBULATORY_CARE_PROVIDER_SITE_OTHER): Payer: Medicare Other | Admitting: Endocrinology

## 2010-06-21 DIAGNOSIS — E113599 Type 2 diabetes mellitus with proliferative diabetic retinopathy without macular edema, unspecified eye: Secondary | ICD-10-CM | POA: Insufficient documentation

## 2010-06-21 DIAGNOSIS — E1140 Type 2 diabetes mellitus with diabetic neuropathy, unspecified: Secondary | ICD-10-CM | POA: Insufficient documentation

## 2010-06-21 DIAGNOSIS — E109 Type 1 diabetes mellitus without complications: Secondary | ICD-10-CM

## 2010-06-21 DIAGNOSIS — E11359 Type 2 diabetes mellitus with proliferative diabetic retinopathy without macular edema: Secondary | ICD-10-CM | POA: Insufficient documentation

## 2010-06-26 NOTE — Assessment & Plan Note (Addendum)
Summary: 2 WK ROV /NWS  #   Vital Signs:  Patient profile:   55 year old male Height:      66 inches (167.64 cm) Weight:      171.50 pounds (77.95 kg) BMI:     27.78 O2 Sat:      94 % on Room air Temp:     98.2 degrees F (36.78 degrees C) oral Pulse rate:   80 / minute BP sitting:   106 / 60  (left arm) Cuff size:   regular  Vitals Entered By: Rebeca Alert CMA Deborra Medina) (June 21, 2010 4:11 PM)  O2 Flow:  Room air CC: 2 week F/U/pt is no longer taking Lisinopril/aj Is Patient Diabetic? Yes   Primary Provider:  Rowe Clack MD  CC:  2 week F/U/pt is no longer taking Lisinopril/aj.  History of Present Illness: no cbg record, but states cbg's are sometimes low at hs, at hs last night, and after he missed breakfast, but took his breakfast novolog.   pt states years of moderate swelling at the injection sites of the anterior thighs, left > right, but no assoc pain.    Current Medications (verified): 1)  Lantus Solostar 100 Unit/ml  Soln (Insulin Glargine) .... 27 Units Subcutaneously Every Evening 2)  Onetouch Ultra Test  Strp (Glucose Blood) .... 4 Times A Day 3)  Lisinopril 10 Mg Tabs (Lisinopril) .[name].. 1 By Mouth Once Daily 4)  Bd Pen Needle Short U/f 31g X 8 Mm Misc (Insulin Pen Needle) .... Use As Directed Dx 250.01 5)  Novolog Flexpen 100 Unit/ml Soln (Insulin Aspart) .... Three Times A Day (Just Before Each Meal) 8-4-8 Units, and Pen Needles 4x A Day.  Allergies (verified): 1)  ! Codeine  Past History:  Past Medical History: Last updated: 01/30/2010 Hepatitis C chronic - declines interferon DM Perpheral Painful Neuropathy DM Prolif Retinopathy s/p laser x 2 Glaucoma Vitiligo ongoing DRUG ABUSE (pt should have NO controlled substances rx'ed)  DIABETES MELLITUS, TYPE I DEPRESSION  Md roster: endo - [facility]   Review of Systems  The patient denies syncope, weight loss, and weight gain.    Physical Exam  General:  Well developed, well  nourished, in no acute distress.  Skin:  injection sites at the anterior thighs have lipohypertrophy.   Impression & Recommendations:  Problem # 1:  DIABETES MELLITUS, TYPE I (ICD-250.01) he may need a simpler regimen, but we'll try to work with this one for now.    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 24 units subcutaneously every evening  Other Orders: Est. Patient Level IV [id number]  Patient Instructions: 1)  check your blood sugar 4 times a day--before the 3 meals, and at bedtime.  also check if you have symptoms of your blood sugar being too high or too low.  please keep a record of the readings and bring it to your next appointment here.  please call us sooner if you are having low blood sugar episodes. 2)  continue novolog three times a day (just before each meal) 8-4-8 units. 3)  reduce lantus to 24 units at bedtime. 4)  Please schedule a follow-up appointment in 6 weeks. 5)  you should avoid injecting into the swollen areas of your thighs.     Orders Added: 1)  Est. Patient  Level IV [id number]

## 2010-06-29 LAB — BASIC METABOLIC PANEL
BUN: 11 mg/dL (ref 6–23)
CO2: 26 mEq/L (ref 19–32)
Calcium: 9.8 mg/dL (ref 8.4–10.5)
Chloride: 103 mEq/L (ref 96–112)
Creatinine, Ser: 1.03 mg/dL (ref 0.4–1.5)
GFR calc Af Amer: >60 mL/min (ref >60)
GFR calc non Af Amer: >60 mL/min (ref >60)
Glucose, Bld: 213 mg/dL — ABNORMAL HIGH (ref 70–99)
Potassium: 3.8 mEq/L (ref 3.5–5.1)
Sodium: 138 mEq/L (ref 135–145)

## 2010-06-29 LAB — LIPID PANEL
Total CHOL/HDL Ratio: 2.6 RATIO
Triglycerides: 118 mg/dL (ref <150)
VLDL: 24 mg/dL (ref 0–40)

## 2010-06-29 LAB — RAPID URINE DRUG SCREEN, HOSP PERFORMED
Amphetamines: NEGATIVE — AB
Barbiturates: NEGATIVE — AB
Benzodiazepines: POSITIVE — AB
Cocaine: POSITIVE — AB

## 2010-06-29 LAB — CBC
HCT: 50.6 % (ref 39.0–52.0)
Hemoglobin: 17.7 g/dL — ABNORMAL HIGH (ref 13.0–17.0)
MCH: 33 pg (ref 26.0–34.0)
MCHC: 35 g/dL (ref 30.0–36.0)
MCV: 94.2 fL (ref 78.0–100.0)
Platelets: 303 10*3/uL (ref 150–400)
RBC: 5.37 MIL/uL (ref 4.22–5.81)
RDW: 12.9 % (ref 11.5–15.5)
WBC: 10.1 10*3/uL (ref 4.0–10.5)

## 2010-06-29 LAB — GLUCOSE, CAPILLARY
Glucose-Capillary: 272 mg/dL — ABNORMAL HIGH (ref 70–99)
Glucose-Capillary: 125 mg/dL — ABNORMAL HIGH (ref 70–99)
Glucose-Capillary: 241 mg/dL — ABNORMAL HIGH (ref 70–99)
Glucose-Capillary: 311 mg/dL — ABNORMAL HIGH (ref 70–99)
Glucose-Capillary: 354 mg/dL — ABNORMAL HIGH (ref 70–99)
Glucose-Capillary: 357 mg/dL — ABNORMAL HIGH (ref 70–99)
Glucose-Capillary: 367 mg/dL — ABNORMAL HIGH (ref 70–99)

## 2010-06-29 LAB — DIFFERENTIAL
Eosinophils Relative: 4 % (ref 0–5)
Lymphocytes Relative: 24 % (ref 12–46)
Lymphs Abs: 2.4 10*3/uL (ref 0.7–4.0)
Neutro Abs: 6.6 10*3/uL (ref 1.7–7.7)

## 2010-06-29 LAB — TSH: TSH: 4.115 u[IU]/mL (ref 0.350–4.500)

## 2010-06-29 LAB — HEMOGLOBIN A1C: Hgb A1c MFr Bld: 9.7 % — ABNORMAL HIGH (ref <5.7)

## 2010-06-29 LAB — ETHANOL: Alcohol, Ethyl (B): 7 mg/dL (ref 0–10)

## 2010-07-12 ENCOUNTER — Ambulatory Visit: Payer: Self-pay | Admitting: Endocrinology

## 2010-08-02 ENCOUNTER — Ambulatory Visit: Payer: Medicare Other | Admitting: Endocrinology

## 2010-08-17 ENCOUNTER — Ambulatory Visit (INDEPENDENT_AMBULATORY_CARE_PROVIDER_SITE_OTHER): Payer: Medicare Other | Admitting: Endocrinology

## 2010-08-17 ENCOUNTER — Other Ambulatory Visit (INDEPENDENT_AMBULATORY_CARE_PROVIDER_SITE_OTHER): Payer: Medicare Other

## 2010-08-17 ENCOUNTER — Encounter: Payer: Self-pay | Admitting: Endocrinology

## 2010-08-17 VITALS — BP 122/62 | HR 92 | Temp 97.9°F | Ht 66.0 in | Wt 173.2 lb

## 2010-08-17 DIAGNOSIS — E109 Type 1 diabetes mellitus without complications: Secondary | ICD-10-CM

## 2010-08-17 LAB — HEMOGLOBIN A1C: Hgb A1c MFr Bld: 9.6 % — ABNORMAL HIGH (ref 4.6–6.5)

## 2010-08-17 NOTE — Progress Notes (Signed)
  Subjective:    Patient ID: [name], male    DOB: 1956-01-15, 55 y.o.   MRN: [id number]  HPI pt states he feels well in general, except for depression.  He attributes this to health problems, and to boredom.  no cbg record, but states cbg's are sometimes low if he takes his novolog, but does not eat.  He says "sometimes i slip back into drugs, but i quit drinking." Past Medical History  Diagnosis Date  . Glaucoma   . HEPATITIS C 11/22/2008  . DIABETES MELLITUS, TYPE I 11/11/2006  . DRUG ABUSE 04/23/2007    pt should have NO controlled substances rx'ed  . Vitiligo 11/22/2008  . Proliferative diabetic retinopathy 06/21/2010  . DEPRESSION 11/11/2006    Past Surgical History  Procedure Date  . Eye surgery     retinal surgery x 2, right eye    History   Social History  . Marital Status: Single    Spouse Name: N/A    Number of Children: N/A  . Years of Education: N/A   Occupational History  .      disabled   Social History Main Topics  . Smoking status: Current Everyday Smoker -- 2.0 packs/day    Types: Cigarettes  . Smokeless tobacco: Not on file  . Alcohol Use: No  . Drug Use: Yes    Special: "Crack" cocaine     s/p rehab x 6  . Sexually Active: Not on file   Other Topics Concern  . Not on file   Social History Narrative   Lives in boarding houseMoving to apt with friend 10/2011Ongoing occ drug use-crack    Current Outpatient Prescriptions on File Prior to Visit  Medication Sig Dispense Refill  . glucose blood (ONE TOUCH ULTRA TEST) test strip 4 times a day. Use as instructed       . insulin glargine (LANTUS SOLOSTAR) 100 UNIT/ML injection Inject 27 Units into the skin. Every evening       . insulin lispro (HUMALOG KWIKPEN) 100 UNIT/ML injection Inject 10 Units into the skin 3 (three) times daily before meals.       . Insulin Pen Needle (B-D ULTRAFINE III SHORT PEN) 31G X 8 MM MISC Use as directed dx 250.01       . lisinopril (PRINIVIL,ZESTRIL) 10 MG tablet Take 10  mg by mouth daily.          Allergies  Allergen Reactions  . Codeine     Family History  Problem Relation Age of Onset  . Arthritis Mother   . Heart disease Mother     CAD  . Diabetes Father   . Kidney disease Father     BP 122/62  Pulse 92  Temp(Src) 97.9 F (36.6 C) (Oral)  Ht 5\' 6"  (1.676 m)  Wt 173 lb 3.2 oz (78.563 kg)  BMI 27.96 kg/m2  SpO2 95%      Review of Systems Denies loc    Objective:   Physical Exam Pulses: dorsalis pedis intact bilat.   Feet: no deformity.  no ulcer on the feet.  feet are of normal color and temp.  no edema Neuro: sensation is intact to touch on the feet       Assessment & Plan:  Dm, therapy limited by noncompliance, and by ongoing drug abuse.  i'll do the best i can.

## 2010-08-17 NOTE — Patient Instructions (Addendum)
check your blood sugar 4 times a day--before the 3 meals, and at bedtime.  also check if you have symptoms of your blood sugar being too high or too low.  please keep a record of the readings and bring it to your next appointment here.  please call us sooner if you are having low blood sugar episodes. continue novolog three times a day (just before each meal) 8-4-8 units.  This should be takes only when you eat, and just before you eat.   continue lantus 24 units at bedtime.   Please schedule a follow-up appointment in 3 months.   good diet and exercise habits significanly improve the control of your diabetes.  please let me know if you wish to be referred to a dietician.  high blood sugar is very risky to your health.  you should see an eye doctor every year.   controlling your blood pressure and cholesterol drastically reduces the damage diabetes does to your body.  this also applies to quitting smoking.  please discuss these with your doctor.  you should take an aspirin every day, unless you have been advised by a doctor not to. (update: i left message on phone-tree:  Change novolog to 10 units tid (qac)

## 2010-08-31 NOTE — Op Note (Signed)
[name], [name]                   ACCOUNT NO.:  [id number]   MEDICAL RECORD NO.:  [id number]          PATIENT TYPE:  INP   LOCATION:  1512                         FACILITY:  [facility]   PHYSICIAN:  Orson Ape. Weatherly, M.D.DATE OF BIRTH:  [birth year]   DATE OF PROCEDURE:  02/23/2005  DATE OF DISCHARGE:                                 OPERATIVE REPORT   PREOPERATIVE DIAGNOSIS:  Large buttocks perianal left side abscess and  necrotizing subcutaneous ischemia.   OPERATION:  Debridement of large abscess with early necrotizing fasciitis.   ANESTHESIA:  General anesthesia.   HISTORY:  [name] is a 55 year old male diabetic long standing who is on  insulin and states that approximately three maybe four days he started  having pain and swelling, he said an abscess of the buttocks area. He denied  having previous problems, and this progressed. He goes to [facility], but  he has not been for this, but today because of marked pain, redness and  necrotic area with purulent drainage, he presented to the emergency room. He  was seen by the ER physician who did blood work. His white count was about  18,000 with marked left shift. He was not extremely febrile, 99.7, but did  have tachycardia, and the ER physician actually obtained a CT to see whether  this was extending down into the pelvis, and I was called while he was  actually in the CAT scanner. The patient states that he has been a diabetic  since age 27. He has gotten the problems of diabetes as far as glaucoma,  visual and peripheral neuropathy, and states he has got a hepatitis C which  of course is not related. I examined him and recommended that he go promptly  to the operating room to debride this rapid, progressive abscess and  necrosis. The actual area of frank necrosis was probably 3 x 4 inches, not  really in the immediate perianal but about 4 inches more lateral over the  left buttocks with the cellulitis extending about 6 inches  in all  directions, including slightly over the midline. It looks like he has got  sort of a dimple and possibly an old pilonidal, but he has had no previous  pilonidal surgery.   He was given 3 g of Zosyn and taken to the operative suite, induction of  general anesthesia with Dr. Thereasa Parkin, and then we placed him face down prone  with rolls up under the rib cage for ventilation. I then jack-knifed the bed  and taped the buttocks apart. The patient was identified, and site had been  marked, etc., and then proceeded with prepping the area with Betadine  solution and then draped in a sterile manner. First, the edges were cut  directly through this necrotic area and got down into a large area of  abscess with kind of dish water purulence, but fortunately there was no gas  noted. Cultured it aerobic and anaerobic. Then, I basically started the  debridement until we were in normal appearing subcutaneous tissue in all  directions. It is probably about  a 3 x 4 inch area of skin loss, and the  area of the subcutaneous tissue goes directly down to the actual buttocks  muscle and probably extends about 2 inches in all directions. I could not  identify any extension actually to the perianal or ischiorectal area, and  fortunately, it was all on the left side. Numerous blood vessels were  electrocauterized or sutured with 3-0 Vicryl and good hemostasis obtained.  After we had basically debrided all of the questionable ischemic and  definitely necrotic tissue, I then packed the area with vaginal packs soaked  in Betadine solution. Then, 4 x 4's were placed over this and held in place  with two large 6-inch tapes. The patient was then turned back in the supine  position, extubated, and sent to the recovery room in stable postoperative  condition. He will be on sliding insulin coverage. I am going to add Flagyl  and await results of cultures. Most likely, this is a combination of strep  and staph  infection that he said started as a boil, and I expect that he is  right. I do not think that the little pilonidal dimple is the origin for  these symptoms. No actual frank necrotic area over in that area. It was all  to the left side of midline. The patient will obviously be  hospitalized for the next few days. He lives alone. The area could come  together after the healing starts without any type of need of __________  skin grafts. Estimated blood loss was probably 100 cc. All of the debrided  tissue will be sent for pathology.           ______________________________  Orson Ape. Rise Patience, M.D.     WJW/MEDQ  D:  02/23/2005  T:  02/25/2005  Job:  [id number]

## 2010-08-31 NOTE — Consult Note (Signed)
[facility]                            ENDOCRINOLOGY CONSULTATION   NAME:[name] [name]                            MRN:          [id number]  DATE:12/13/2005                            DOB:          Oct 26, 1955    REFERRING PHYSICIAN:  Monia Sabal. [name] Igo, MD   REASON FOR REFERRAL:  Diabetes.   HISTORY OF PRESENT ILLNESS:  This 55 year old man with a 37-year history of  type I diabetes.  He was placed on insulin very soon after the diagnosis.  He has several chronic complications of his diabetes.  He currently takes  Lantus 20 units and then Humalog 75/25 insulin 20 units every morning, and  he also takes 20 units in the evening if his glucose is elevated.  Symptomatically, he has several years of severe fatigue with associated  numbness of his feet and pain at his feet.  He seldom checks his glucose's,  but he states that when he does, they are quite variable.  He feels that  Neurontin has worsened his glucose control, so he has gone off this  medication.   PAST MEDICAL HISTORY:  1. Cigarette smoker.  2. Depression.  3. Hepatitis C.  4. Diabetes, peripheral neuropathy with a painful component.  5. Proliferative retinopathy.  6. Glaucoma.   SOCIAL HISTORY:  He is single and he is divorced and disabled and he has  been incarcerated, he states, several times for failure to pay child  support.  He has a history of intravenous drug abuse, but none recently.  He  states he also used to consume alcohol excessively, but has not consumed any  in some years now.   FAMILY HISTORY:  Positive for diabetes in his father who died at the age of  33.   REVIEW OF SYSTEMS:  He has lost 20 pounds in the past two years.  He rarely  has hypoglycemia.  He denies the following:  Chest pain, shortness of  breath, nausea and vomiting.   PHYSICAL EXAMINATION:  VITAL SIGNS:  Blood pressure 133/86, heart rate 86,  temperature 97.8, weight 180.  GENERAL APPEARANCE:  No  distress.  SKIN:  Not diaphoretic.  I do not see a rash.  HEENT:  No proptosis.  No periorbital swelling.  NECK:  Thyroid is normal.  CHEST:  Clear to auscultation.  No respiratory distress.  CARDIOVASCULAR:  No JVD.  No edema.  Regular rate and rhythm.  No murmur.  EXTREMITIES:  Pedal pulses are at present but they are decreased and normal.  There is no bruit at the carotid arteries.  Feet normal color and  temperature.  There is no ulcer present on the feet.  NEUROLOGICAL:  Alert and oriented.  He does not appear anxious or depressed  at the time of his visit today and sensation is significantly decreased on  the feet.   LABORATORY DATA:  Forwarded by Dr. Jobe [name]:  Hemoglobin A1C recently 8.8.  On  another occasion earlier this year, hemoglobin A1C was 9.4 and a urine  microalbuminuria was apparently normal.   IMPRESSION:  1. Type  I diabetes.  He has several serious chronic complications.  2. History of depression, but I am uncertain as to exactly what type of      psychological disorder he has.  3. Nonadherence which is probably associated with #2.  This significantly      impairs Dr. Ronnell [name] ability to achieve good control.   PLAN:  1. I discussed the patient's risk of nonadherence and the risk of poorly      controlled diabetes.  2. Increase Lantus to 25 units q.h.s.  3. I have advised him to take a scheduled dose of Humalog 10 breakfast, 5      lunch, 10 supper.  4. Return in three weeks.  5. Check glucose q.i.d.  6. He states it has been several years since he has had peripheral nerve      conduction velocities.  He is requesting today that these be repeated      because he has a hearing upcoming for his disability.  These are      ordered today.                                   [name] A. Loanne Drilling, MD   SAE/MedQ  DD:  12/16/2005  DT:  12/16/2005  Job #:  [id number]   cc:   [name] Sabal. [name] Igo, MD  Cline Crock, M.D.

## 2010-08-31 NOTE — Consult Note (Signed)
[name]                            ENDOCRINOLOGY CONSULTATION   NAME:[name], [name]                          MRN:          [id number]  DATE:02/20/2006                            DOB:          1956/04/15    REASON FOR VISIT:  Followup diabetes.   HISTORY OF PRESENT ILLNESS:  A 55 year old man who states his glucoses are  improved, but still variable. He states that they are generally lowest in  the afternoon. He brings with him a record of some glucoses.   PAST MEDICAL HISTORY:  1. Cigarette smoker.  2. Depression.  3. Hepatitis C.  4. Painful peripheral neuropathy.  5. Proliferative retinopathy.  6. Glaucoma.  7. Vitiligo.   REVIEW OF SYSTEMS:  He has lost a few pounds since his last visit. He has  hypoglycemia in the afternoon once or twice a week.   PHYSICAL EXAMINATION:  VITAL SIGNS:  Blood pressure 135/73, heart rate is  80, temperature 97.3, the weight is 172.  GENERAL:  No distress.  FEET:  Sensation is intact to touch on the feet, but decreased from normal.  The feet are of normal color and temperature and there is no ulcer. Dorsalis  pedis pulses are intact bilaterally.   LABORATORY DATA:  On February 20, 2006, hemoglobin A1c is 8.9.   IMPRESSION:  Improved control of diabetes.   PLAN:  1. Also start checking glucose at h.s.  2. Decrease Humalog to 11 breakfast, 2 lunch, 11 supper.  3. Take an extra unit of Humalog anytime your glucose is above 200.  4. Continue Lantus 25 units q.h.s.    ______________________________  Jacelyn Pi. Loanne Drilling, MD    SAE/MedQ  DD: 02/21/2006  DT: 02/21/2006  Job #: [id number]   cc:   Janus Molder, NP

## 2010-08-31 NOTE — Consult Note (Signed)
[facility]                          ENDOCRINOLOGY CONSULTATION   NAME:[name], [name]                          MRN:          [id number]  DATE:05/22/2006                            DOB:          01/10/1956    REASON FOR VISIT:  Followup diabetes.   HISTORY OF PRESENT ILLNESS:  A 55 year old man who states his glucoses  have continued to improve.  He is considering therapy for his hepatitis  C and states he wants an okay from me from the standpoint of his  diabetes to start medication for hepatitis C.   SOCIAL HISTORY:  He states he is currently pursuing his disability claim  and has not yet heard back.   REVIEW OF SYSTEMS:  Mild hypoglycemia occasionally, any time of day.   PHYSICAL EXAMINATION:  VITAL SIGNS: Blood pressure 114/70, heart rate  81, temperature 98.3.  The weight is 176.  GENERAL: No distress.  NEUROLOGIC: He does not appear especially anxious, but he is quite  talkative.   LABORATORY STUDIES:  I have reviewed 30 home glucose readings brought by  him.   IMPRESSION:  1. Type 1 diabetes with apparent improvement in his glucose.  2. Hepatitis C.   PLAN:  1. Also check glucoses nightly.  2. As of the time of this dictation, his A1c result has not been      reported, so I will enquire about this.  3. At his request, I am sending a copy of this letter to Dr. Maceo Pro      saying it is okay from the standpoint of his diabetes that he start      his hepatitis C therapy.  4. Return in 3 months.     Sean A. Loanne Drilling, MD  Electronically Signed    SAE/MedQ  DD: 05/25/2006  DT: 05/25/2006  Job #: [id number]   cc:   Janus Molder, NP, Loralyn Freshwater, M.D.

## 2010-08-31 NOTE — H&P (Signed)
NAMESYON, WINTERHALTER                   ACCOUNT NO.:  [id number]   MEDICAL RECORD NO.:  [id number]          PATIENT TYPE:  EMS   LOCATION:  ED                           FACILITY:  [facility]   PHYSICIAN:  Orson Ape. Weatherly, M.D.DATE OF BIRTH:  28-Apr-1955   DATE OF ADMISSION:  02/23/2005  DATE OF DISCHARGE:                                HISTORY & PHYSICAL   CHIEF COMPLAINT:  Diabetic with severe infection of buttocks.   HISTORY:  [name] is a 55 year old diabetic of longstanding with the onset  of diabetes at age 8, who states that for three days he has had a  progressive boil infection on the left buttocks that has become extremely  painful and foul purulent drainage.  He has marked cellulitis of the whole  buttocks extending down to the top part of the leg on up to the anus but  really appears to be more of a buttocks than an actual perirectal abscess  with approximately a 3 inch area of frank necrosis with foul, bloody,  smelling spontaneous drainage.  The patient was febrile at 99.7 and pulse  was 106 when he originally arrived.  An IV has been started.  His laboratory  studies show a white count of 18,500 with a hematocrit of 46.7.  He has  marked left shift at 85%, and his electrolytes glucose is 233, and his BUN  and creatinine are 1.0 and BUN of 13.  The patient says he does not have a  regular endocrinologist.  He is now followed by [facility].  He is not able  to work.  He has filed for [employer] but has not been approved yet,  but he feels that is pending.  He has had a past history of hepatitis C.  He  has got glaucoma, and he began to get neuropathy in the lower extremities.  He has had problems with child support and been incarcerated because of this  and he said while he was there his sugars would be all over the place  because of the insulin that they were giving him.  He normally takes 20  units of Lantus and then 20 units a.m. and p.m. of Humulin 75/25, and he  has  taken this today. On previous surgeries, he denies previous problems with  perirectal abscess or perirectal surgeries.  He lives alone and swears that  this only started three days earlier.  The patient has had a blood culture  in the ER and the ER physician obtained a CT to see whether or not there was  any extension up into the perirectal or buttocks pelvic area and this looks  like it is just a very marked cellulitis but it is obviously necrotic and  this will need OR drainage at this time promptly.  We will give him Zosyn  3.375 grams IV now and the patient is in agreement to go to the operating  room.  I am going to need to do him in a prone position.  The EKG and chest  x-ray are pending.  And, his  electrolytes are satisfactory with sodium 133  and potassium 3.9, CO2 of 98, and glucose 233.  He has had contrast shortly  earlier but otherwise has not eaten this afternoon.           ______________________________  Orson Ape. Rise Patience, M.D.     WJW/MEDQ  D:  02/23/2005  T:  02/23/2005  Job:  [id number]

## 2010-08-31 NOTE — Discharge Summary (Signed)
[name], [name]                   ACCOUNT NO.:  [id number]   MEDICAL RECORD NO.:  [id number]          PATIENT TYPE:  INP   LOCATION:  1512                         FACILITY:  [facility]   PHYSICIAN:  Orson Ape. Weatherly, M.D.DATE OF BIRTH:  Aug 20, 1955   DATE OF ADMISSION:  02/23/2005  DATE OF DISCHARGE:  02/28/2005                                 DISCHARGE SUMMARY   DISCHARGE DIAGNOSES:  1.  Large multiloculated abscess buttocks.  2.  Diabetes mellitus, insulin dependent.   OPERATION:  Raw debridement of necrotizing fasciitis, multiple loculated  abscess.  Cultures MRSA.   HISTORY OF PRESENT ILLNESS:  [name] is a 55 year old diabetic with long  standing who lives independently.  He is medically disabled.  Presently,  hopefully, he is trying to get approved by Medicare.  He is presently on  Medicaid.  He states that he had an infection brewing for approximately  three days and presented to the emergency room.  He was seen by the ER  physician who was this large necrotic area on the buttocks and the pure anal  area.  Fortunately, it is not a true perirectal abscess, and actually the ER  physician, after obtaining lab studies which showed an elevated glucose and  also a white count of about 19,000, obtained a CT to see whether or not  there was an abscess or loculation.  Zada Finders read this and said that it  was part cellulitis, and etc, but no frank abscess.  On physical  examination, however, there was a theory of at least 3x4 inches of frank  necrosis with foul, bloody drainage, and I recommended that we take him to  the operating room.  I started him on Zosyn and Flagyl.  The cultures, after  three days, returned staff aureus methicillin resistance and we switched him  to doxycycline.  We debrided all the areas of chronically infected tissue.  The wound is clean.  He has done dressing changes after the first two times  that I changed his dressing.  By getting in the shower, the  nurses are  helping him pack two open 4x4 soaked with Betadine into the cavity and then  cover him with AVD and then Jockey-type underwear.  There is no evidence of  any extravasation.  The patient's glucose is still mildly elevated, and he  hopes to get an appointment with one of the endocrinologists.  He has  formerly been going to [facility].  The pathology report shows skin and  subcutaneous tissue ulcerations suffered at inflammation necrosis in abscess  formation read by Dr. Saralyn Pilar.  He is on doxycycline.  Arrangements for  visiting nurses have been made on a daily basis.  Hopefully, he can soak  twice a day and the routine will be wash in the shower, soaking, wash, scrub  and then the dressing changes and to see me in the office next Tuesday.  He  will be on the doxycycline for two weeks, and with his glucose's running in  the 220-230 range, I think that he is on 20 Lantus  75/25 b.i.d.  I think  that the b.i.d. ought to be increased to probably 25 or possibly 30, if he  continues running in the approximately 200 range.  He can make that  adjustment and adjust that as his insulin requirements decrease as the  inflammation subsides.  The erythema is greatly decreasing.  His last blood  work showed a white count of 8300, hematocrit 39.  He is also on Tylox for  pain.           ______________________________  Orson Ape. Rise Patience, M.D.     WJW/MEDQ  D:  02/28/2005  T:  02/28/2005  Job:  [id number]

## 2010-10-15 ENCOUNTER — Encounter: Payer: Self-pay | Admitting: Internal Medicine

## 2010-11-16 ENCOUNTER — Encounter: Payer: Self-pay | Admitting: Endocrinology

## 2010-11-16 ENCOUNTER — Other Ambulatory Visit: Payer: Self-pay | Admitting: Endocrinology

## 2010-11-16 ENCOUNTER — Other Ambulatory Visit (INDEPENDENT_AMBULATORY_CARE_PROVIDER_SITE_OTHER): Payer: Medicare Other

## 2010-11-16 ENCOUNTER — Ambulatory Visit (INDEPENDENT_AMBULATORY_CARE_PROVIDER_SITE_OTHER): Payer: Medicare Other | Admitting: Endocrinology

## 2010-11-16 VITALS — BP 138/72 | HR 87 | Temp 97.9°F | Ht 72.0 in | Wt 172.4 lb

## 2010-11-16 DIAGNOSIS — E109 Type 1 diabetes mellitus without complications: Secondary | ICD-10-CM

## 2010-11-16 DIAGNOSIS — B1710 Acute hepatitis C without hepatic coma: Secondary | ICD-10-CM

## 2010-11-16 MED ORDER — LISINOPRIL 5 MG PO TABS: 5.0000 mg | ORAL_TABLET | Freq: Every day | ORAL | Status: DC

## 2010-11-16 NOTE — Progress Notes (Signed)
  Subjective:    Patient ID: [name], male    DOB: [birth year], 55 y.o.   MRN: [id number]  HPI no cbg record, but states cbg's are low when he walks (which is often).  This happens almost daily, and usually in the afternoon.   He has few years of moderate numbness of the legs, and assoc pain.   He stopped lisinopril.   He was kicked out of hepatitis-c program, due to substance abuse.  He says he is now clean and sober, and wants to be retested now.   Past Medical History  Diagnosis Date  . Glaucoma   . HEPATITIS C 11/22/2008  . DIABETES MELLITUS, TYPE I 11/11/2006  . DRUG ABUSE 04/23/2007    pt should have NO controlled substances rx'ed  . Vitiligo 11/22/2008  . Proliferative diabetic retinopathy 06/21/2010  . DEPRESSION 11/11/2006    Past Surgical History  Procedure Date  . Eye surgery     retinal surgery x 2, right eye    History   Social History  . Marital Status: Single    Spouse Name: N/A    Number of Children: N/A  . Years of Education: N/A   Occupational History  .      disabled   Social History Main Topics  . Smoking status: Current Everyday Smoker -- 2.0 packs/day    Types: Cigarettes  . Smokeless tobacco: Not on file   Comment: Single, lives in boarding house. disable, prev mental health drug counselor.   . Alcohol Use: No  . Drug Use: Yes    Special: "Crack" cocaine     s/p rehab x 6  . Sexually Active: Not on file   Other Topics Concern  . Not on file   Social History Narrative   Lives in boarding houseMoving to apt with friend 10/2011Ongoing occ drug use-crack    Current Outpatient Prescriptions on File Prior to Visit  Medication Sig Dispense Refill  . glucose blood (ONE TOUCH ULTRA TEST) test strip 4 times a day. Use as instructed       . insulin glargine (LANTUS SOLOSTAR) 100 UNIT/ML injection Inject 27 Units into the skin. Every evening       . Insulin Pen Needle (B-D ULTRAFINE III SHORT PEN) 31G X 8 MM MISC Use as directed dx 250.01          Allergies  Allergen Reactions  . Codeine     Family History  Problem Relation Age of Onset  . Arthritis Mother   . Heart disease Mother     CAD  . Diabetes Father   . Kidney disease Father    BP 138/72  Pulse 87  Temp(Src) 97.9 F (36.6 C) (Oral)  Ht 6' (1.829 m)  Wt 172 lb 6.4 oz (78.2 kg)  BMI 23.38 kg/m2  SpO2 95%  Review of Systems Denies loc.  No weight change    Objective:   Physical Exam Pulses: dorsalis pedis intact bilat.   Feet: no deformity.  no ulcer on the feet.  feet are of normal color and temp.  no edema.  There is bilteral onychomycosis. Neuro: sensation is intact to touch on the feet, but decreased from normal  Lab Results  Component Value Date   HGBA1C 8.6* 11/16/2010      Assessment & Plan:  Dm, needs increased rx Hepatitis-c, on no rx. Dm nephropathy, needs rx Painful neuropathy

## 2010-11-16 NOTE — Patient Instructions (Addendum)
check your blood sugar 4 times a day--before the 3 meals, and at bedtime.  also check if you have symptoms of your blood sugar being too high or too low.  please keep a record of the readings and bring it to your next appointment here.  please call us sooner if you are having low blood sugar episodes. reduce novolog to units three times a day (just before each meal), 01-20-09 units.  This should be takes only when you eat, and just before you eat.   continue lantus 24 units at bedtime.   blood tests are being ordered for you today.  please call [phone] to hear your test results.  You will be prompted to enter the 9-digit "MRN" number that appears at the top left of this page, followed by #.  Then you will hear the message. Please schedule a follow-up appointment in 3 months.   Please see dr Asa Lente soon. You should resume the lisinopril at 5 mg daily. (update: i left message on phone-tree:  rx as we discussed)

## 2010-11-22 ENCOUNTER — Encounter: Payer: Self-pay | Admitting: *Deleted

## 2011-01-18 LAB — GLUCOSE, CAPILLARY: Glucose-Capillary: 81 mg/dL (ref 70–99)

## 2011-02-13 ENCOUNTER — Other Ambulatory Visit: Payer: Self-pay | Admitting: Internal Medicine

## 2011-02-18 ENCOUNTER — Encounter: Payer: Self-pay | Admitting: Internal Medicine

## 2011-02-18 ENCOUNTER — Other Ambulatory Visit (INDEPENDENT_AMBULATORY_CARE_PROVIDER_SITE_OTHER): Payer: Medicare Other

## 2011-02-18 ENCOUNTER — Ambulatory Visit (INDEPENDENT_AMBULATORY_CARE_PROVIDER_SITE_OTHER): Payer: Medicare Other | Admitting: Internal Medicine

## 2011-02-18 ENCOUNTER — Ambulatory Visit (INDEPENDENT_AMBULATORY_CARE_PROVIDER_SITE_OTHER): Payer: Medicare Other | Admitting: Endocrinology

## 2011-02-18 ENCOUNTER — Encounter: Payer: Self-pay | Admitting: Endocrinology

## 2011-02-18 VITALS — BP 142/76 | HR 88 | Temp 98.4°F | Ht 72.0 in | Wt 178.6 lb

## 2011-02-18 DIAGNOSIS — B1710 Acute hepatitis C without hepatic coma: Secondary | ICD-10-CM

## 2011-02-18 DIAGNOSIS — I10 Essential (primary) hypertension: Secondary | ICD-10-CM

## 2011-02-18 DIAGNOSIS — Z23 Encounter for immunization: Secondary | ICD-10-CM

## 2011-02-18 DIAGNOSIS — E109 Type 1 diabetes mellitus without complications: Secondary | ICD-10-CM

## 2011-02-18 HISTORY — DX: Essential (primary) hypertension: I10

## 2011-02-18 LAB — HEMOGLOBIN A1C: Hgb A1c MFr Bld: 8.7 % — ABNORMAL HIGH (ref 4.6–6.5)

## 2011-02-18 LAB — HEPATIC FUNCTION PANEL
AST: 34 U/L (ref 0–37)
Alkaline Phosphatase: 72 U/L (ref 39–117)
Total Bilirubin: 0.6 mg/dL (ref 0.3–1.2)

## 2011-02-18 LAB — BASIC METABOLIC PANEL
BUN: 13 mg/dL (ref 6–23)
Calcium: 9.3 mg/dL (ref 8.4–10.5)
GFR: 70.34 mL/min (ref >60.00)
Glucose, Bld: 242 mg/dL — ABNORMAL HIGH (ref 70–99)
Potassium: 4.3 mEq/L (ref 3.5–5.1)
Sodium: 139 mEq/L (ref 135–145)

## 2011-02-18 MED ORDER — LOSARTAN POTASSIUM 25 MG PO TABS: 25.0000 mg | ORAL_TABLET | Freq: Every day | ORAL | Status: DC

## 2011-02-18 NOTE — Progress Notes (Signed)
Subjective:    Patient ID: [name], male    DOB: 01-Feb-1956, 55 y.o.   MRN: [id number]  HPI The state of at least three ongoing medical problems is addressed today: DM:  Pt says he lost his meter, so he has not been checking cbg's.  denies hypoglycemia HTN: he does not take the zestril.  Denies sob.   Hep-c: he saw hepatologist approx 2007.  he says he did not get interferon, because he failed a drug test there.  No brbpr.   Past Medical History  Diagnosis Date  . Glaucoma   . HEPATITIS C 11/22/2008  . DIABETES MELLITUS, TYPE I 11/11/2006  . DRUG ABUSE 04/23/2007    pt should have NO controlled substances rx'ed  . Vitiligo 11/22/2008  . Proliferative diabetic retinopathy 06/21/2010  . DEPRESSION 11/11/2006    Past Surgical History  Procedure Date  . Eye surgery     retinal surgery x 2, right eye    History   Social History  . Marital Status: Single    Spouse Name: N/A    Number of Children: N/A  . Years of Education: N/A   Occupational History  .      disabled   Social History Main Topics  . Smoking status: Current Everyday Smoker -- 2.0 packs/day    Types: Cigarettes  . Smokeless tobacco: Not on file   Comment: Single, lives in boarding house. disable, prev mental health drug counselor.   . Alcohol Use: No  . Drug Use: Yes    Special: "Crack" cocaine     s/p rehab x 6  . Sexually Active: Not on file   Other Topics Concern  . Not on file   Social History Narrative   Lives in boarding houseMoving to apt with friend 10/2011Ongoing occ drug use-crack    Current Outpatient Prescriptions on File Prior to Visit  Medication Sig Dispense Refill  . glucose blood (ONE TOUCH ULTRA TEST) test strip 4 times a day. Use as instructed       . insulin aspart (NOVOLOG) 100 UNIT/ML injection Inject into the skin 3 (three) times daily before meals. 01-20-09 units      . Insulin Pen Needle (B-D ULTRAFINE III SHORT PEN) 31G X 8 MM MISC Use as directed dx 250.01       . LANTUS  SOLOSTAR 100 UNIT/ML injection INJECT 27 UNITS UNDER THE SKIN EVERY EVENING  15 mL  5    Allergies  Allergen Reactions  . Codeine     Family History  Problem Relation Age of Onset  . Arthritis Mother   . Heart disease Mother     CAD  . Diabetes Father   . Kidney disease Father     BP 142/76  Pulse 88  Temp(Src) 98.4 F (36.9 C) (Oral)  Ht 6' (1.829 m)  Wt 178 lb 9.6 oz (81.012 kg)  BMI 24.22 kg/m2  SpO2 95%  Review of Systems denies weight and abd pain    Objective:   Physical Exam VITAL SIGNS:  See vs page. GENERAL: no distress. Pulses: dorsalis pedis intact bilat.   Feet: no deformity.  no ulcer on the feet.  feet are of normal color and temp.  no edema.  There is bilateral onychomycosis. Neuro: sensation is intact to touch on the feet, but severely decreased from normal     Assessment & Plan:  Htn, therapy limited by noncompliance.  i'll do the best i can.  i have deleted the  zestril from med list, due to his refusal. Type 1 DM, therapy limited by noncompliance.  i'll do the best i can. Chronic hep-c.  He should try going back to the specialist.

## 2011-02-18 NOTE — Assessment & Plan Note (Signed)
Declined ACEI but willing to try ARB after reviewing indications for "renal protection from DM" New erx for ARB done - to call if problems BP Readings from Last 3 Encounters:  02/18/11 142/76  02/18/11 142/76  11/16/10 138/72

## 2011-02-18 NOTE — Patient Instructions (Signed)
It was good to see you today. We have reviewed your prior records including labs and tests today Start losartan in place of prior lisinopril to protect your kidneys from diabetes - Your prescription(s) have been submitted to your pharmacy. Please take as directed and contact our office if you believe you are having problem(s) with the medication(s). STOP smoking! Flu shot done today Please schedule followup in 6 months , call sooner if problems.

## 2011-02-18 NOTE — Patient Instructions (Addendum)
check your blood sugar 4 times a day--before the 3 meals, and at bedtime.  also check if you have symptoms of your blood sugar being too high or too low.  please keep a record of the readings and bring it to your next appointment here.  please call us sooner if you are having low blood sugar episodes. reduce novolog to units three times a day (just before each meal), 01-20-09 units.  This should be takes only when you eat, and just before you eat.   continue lantus 24 units at bedtime.   blood tests are being ordered for you today.  please call [phone] to hear your test results.  You will be prompted to enter the 9-digit "MRN" number that appears at the top left of this page, followed by #.  Then you will hear the message. Please schedule a follow-up appointment in 3 months.   Please see dr Asa Lente soon. Here is a new meter.  Refer back to the hepatitis specialist.  you will receive a phone call, about a day and time for an appointment

## 2011-02-18 NOTE — Assessment & Plan Note (Signed)
Prior evaluation 2007 by hepatologist for same - never underwent treatment to to ongoing drug abuse at the time New refer made by endocrinologist today Encouraged to consider followup for treatment of same

## 2011-02-18 NOTE — Assessment & Plan Note (Signed)
Therapy limited by noncompliance 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 Follows with endo ([facility]), continue same recommendations Lab Results  Component Value Date   HGBA1C 8.6* 11/16/2010

## 2011-02-18 NOTE — Progress Notes (Signed)
  Subjective:    Patient ID: [name], male    DOB: 05-Mar-1956, 55 y.o.   MRN: [id number]  HPI Here for follow up - reviewed chronic medical issues:   DM1 - follows with endo for same; dx age 67y - on insulin tx since dx - noncompliant with check cbgs due to cost of strips - denies symptoms hypoglycemia - reports compliance with ongoing medical treatment and no changes in medication dose or frequency. denies adverse side effects related to current therapy.   drug and alcohol abuse - long hx same (>30y) - intermitt use now - attends AA meetings - s/p rehab x 6 in past - in/out of boarding house but tried to stay in "cleaner" environment   hep c, chronic - related to prior IVDA - considered interferon tx but declined due to +UDS - now, pt declines due to depression hx and risk of same on tx   chronic depression - intol of meds for same - has good insight into his condition and triggers - no unexplainable sadness at this time and no SI/HI   Past Medical History  Diagnosis Date  . Glaucoma   . HEPATITIS C     chronic  . DIABETES MELLITUS, TYPE I   . DRUG ABUSE     pt should have NO controlled substances rx'ed  . Vitiligo   . Proliferative diabetic retinopathy   . DEPRESSION     Review of Systems  Constitutional: Negative for fever and unexpected weight change.  Respiratory: Negative for cough and shortness of breath.   Cardiovascular: Negative for chest pain.  Neurological: Negative for headaches.       Objective:   Physical Exam BP 142/76  Pulse 88  Temp(Src) 98.4 F (36.9 C) (Oral)  Wt 178 lb (80.74 kg)  SpO2 95% Wt Readings from Last 3 Encounters:  02/18/11 178 lb (80.74 kg)  02/18/11 178 lb 9.6 oz (81.012 kg)  11/16/10 172 lb 6.4 oz (78.2 kg)   Constitutional:  He appears well-developed and well-nourished. No distress.  Neck: Normal range of motion. Neck supple. No JVD present. No thyromegaly present.  Cardiovascular: Normal rate, regular rhythm and normal heart  sounds.  No murmur heard. no BLE edema Pulmonary/Chest: Effort normal and breath sounds normal. No respiratory distress. no wheezes.  Skin: vitiligo; Skin is warm and dry.  No erythema or ulceration.  Psychiatric: he has a normal mood and affect. behavior is normal. Judgment and thought content normal.   Lab Results  Component Value Date   WBC 10.1 11/20/2009   HGB 17.7* 11/20/2009   HCT 50.6 11/20/2009   PLT 303 11/20/2009   GLUCOSE 360* 01/11/2010   CHOL 188 01/11/2010   TRIG 64.0 01/11/2010   HDL 71.20 01/11/2010   LDLCALC 104* 01/11/2010   ALT 38 01/11/2010   AST 33 01/11/2010   NA 136 01/11/2010   K 5.0 01/11/2010   CL 103 01/11/2010   CREATININE 0.9 01/11/2010   BUN 13 01/11/2010   CO2 28 01/11/2010   TSH 4.115 11/22/2009   PSA 0.36 11/19/2006   HGBA1C 8.6* 11/16/2010   MICROALBUR 3.9* 01/11/2010      Assessment & Plan:  See problem list. Medications and labs reviewed today.

## 2011-05-16 ENCOUNTER — Ambulatory Visit: Payer: Medicare Other | Admitting: Gastroenterology

## 2011-05-21 ENCOUNTER — Ambulatory Visit (INDEPENDENT_AMBULATORY_CARE_PROVIDER_SITE_OTHER): Payer: Medicare Other | Admitting: Endocrinology

## 2011-05-21 ENCOUNTER — Other Ambulatory Visit (INDEPENDENT_AMBULATORY_CARE_PROVIDER_SITE_OTHER): Payer: Medicare Other

## 2011-05-21 ENCOUNTER — Encounter: Payer: Self-pay | Admitting: Endocrinology

## 2011-05-21 VITALS — BP 140/70 | HR 68 | Temp 98.0°F | Ht 75.0 in | Wt 172.2 lb

## 2011-05-21 DIAGNOSIS — E109 Type 1 diabetes mellitus without complications: Secondary | ICD-10-CM

## 2011-05-21 MED ORDER — INSULIN LISPRO 100 UNIT/ML ~~LOC~~ SOLN: SUBCUTANEOUS | Status: DC

## 2011-05-21 NOTE — Patient Instructions (Addendum)
check your blood sugar 4 times a day--before the 3 meals, and at bedtime.  also check if you have symptoms of your blood sugar being too high or too low.  please keep a record of the readings and bring it to your next appointment here.  please call us sooner if you are having low blood sugar episodes. change novolog to three times a day (just before each meal), 01-19-11 units.  This should be takes only when you eat, and just before you eat.   continue lantus 24 units at bedtime.   blood tests are being ordered for you today.  please call [phone] to hear your test results.  You will be prompted to enter the 9-digit "MRN" number that appears at the top left of this page, followed by #.  Then you will hear the message. Please schedule a follow-up appointment in 3 months.  You should avoid injecting into the swollen area of your left thigh.   (update: i left message on phone-tree:  Increase humalog to 02-19-12 units).

## 2011-05-21 NOTE — Progress Notes (Signed)
  Subjective:    Patient ID: [name], male    DOB: 01-Aug-1955, 56 y.o.   MRN: [id number]  HPI Pt returns for f/u of type 1 DM (1970).  He is recovering from gastroenteritis.  no cbg record, but states cbg's vary from 50-350.  It is in general lowest in the afternoon, and highest at hs.   Pt states few years of moderate swelling at the insulin injection site at the left anterior thigh, but no assoc pain. Past Medical History  Diagnosis Date  . Glaucoma   . HEPATITIS C     chronic  . DIABETES MELLITUS, TYPE I   . DRUG ABUSE     pt should have NO controlled substances rx'ed  . Vitiligo   . Proliferative diabetic retinopathy   . DEPRESSION   . Hypertension 02/18/2011    Past Surgical History  Procedure Date  . Eye surgery     retinal surgery x 2, right eye    History   Social History  . Marital Status: Single    Spouse Name: N/A    Number of Children: N/A  . Years of Education: N/A   Occupational History  .      disabled   Social History Main Topics  . Smoking status: Current Everyday Smoker -- 2.0 packs/day    Types: Cigarettes  . Smokeless tobacco: Not on file   Comment: Single, lives in boarding house. disable, prev mental health drug counselor.   . Alcohol Use: No  . Drug Use: Yes    Special: "Crack" cocaine     s/p rehab x 6  . Sexually Active: Not on file   Other Topics Concern  . Not on file   Social History Narrative   Lives in boarding houseOngoing occ drug use-crack    Current Outpatient Prescriptions on File Prior to Visit  Medication Sig Dispense Refill  . glucose blood (ONE TOUCH ULTRA TEST) test strip 4 times a day. Use as instructed       . Insulin Pen Needle (B-D ULTRAFINE III SHORT PEN) 31G X 8 MM MISC Use as directed dx 250.01       . LANTUS SOLOSTAR 100 UNIT/ML injection INJECT 27 UNITS UNDER THE SKIN EVERY EVENING  15 mL  5  . losartan (COZAAR) 25 MG tablet Take 1 tablet (25 mg total) by mouth daily.  30 tablet  11    Allergies    Allergen Reactions  . Codeine     Family History  Problem Relation Age of Onset  . Arthritis Mother   . Heart disease Mother     CAD  . Diabetes Father   . Kidney disease Father     BP 140/70  Pulse 68  Temp(Src) 98 F (36.7 C) (Oral)  Ht 6\' 3"  (1.905 m)  Wt 172 lb 4 oz (78.132 kg)  BMI 21.53 kg/m2  Review of Systems Denies loc and weight change    Objective:   Physical Exam VITAL SIGNS:  See vs page GENERAL: no distress SKIN:  Insulin injection sites at the anterior abdomen are normal  Lab Results  Component Value Date   HGBA1C 9.3* 05/21/2011      Assessment & Plan:  DM, needs increased rx Hypertrophy due to insulin injections, new

## 2011-06-12 ENCOUNTER — Other Ambulatory Visit: Payer: Self-pay | Admitting: Endocrinology

## 2011-06-12 ENCOUNTER — Other Ambulatory Visit: Payer: Self-pay | Admitting: Internal Medicine

## 2011-08-19 ENCOUNTER — Ambulatory Visit (INDEPENDENT_AMBULATORY_CARE_PROVIDER_SITE_OTHER): Payer: Medicare Other | Admitting: Internal Medicine

## 2011-08-19 ENCOUNTER — Encounter: Payer: Self-pay | Admitting: Internal Medicine

## 2011-08-19 ENCOUNTER — Other Ambulatory Visit (INDEPENDENT_AMBULATORY_CARE_PROVIDER_SITE_OTHER): Payer: Medicare Other

## 2011-08-19 ENCOUNTER — Encounter: Payer: Self-pay | Admitting: Endocrinology

## 2011-08-19 ENCOUNTER — Ambulatory Visit (INDEPENDENT_AMBULATORY_CARE_PROVIDER_SITE_OTHER): Payer: Medicare Other | Admitting: Endocrinology

## 2011-08-19 VITALS — BP 130/70 | HR 75 | Temp 97.0°F | Ht 72.0 in | Wt 173.4 lb

## 2011-08-19 VITALS — BP 130/70 | HR 75 | Temp 97.0°F | Ht 72.0 in | Wt 173.0 lb

## 2011-08-19 DIAGNOSIS — E1139 Type 2 diabetes mellitus with other diabetic ophthalmic complication: Secondary | ICD-10-CM

## 2011-08-19 DIAGNOSIS — B1710 Acute hepatitis C without hepatic coma: Secondary | ICD-10-CM

## 2011-08-19 DIAGNOSIS — H579 Unspecified disorder of eye and adnexa: Secondary | ICD-10-CM

## 2011-08-19 DIAGNOSIS — E109 Type 1 diabetes mellitus without complications: Secondary | ICD-10-CM

## 2011-08-19 DIAGNOSIS — E11359 Type 2 diabetes mellitus with proliferative diabetic retinopathy without macular edema: Secondary | ICD-10-CM

## 2011-08-19 DIAGNOSIS — I10 Essential (primary) hypertension: Secondary | ICD-10-CM

## 2011-08-19 NOTE — Assessment & Plan Note (Signed)
Prior evaluation 2007 by hepatologist for same - never underwent treatment to to ongoing drug abuse at the time Declines IFN due to depression hx and prior failure UDS

## 2011-08-19 NOTE — Assessment & Plan Note (Signed)
started ARB 02/2011 after reviewing indications for "renal protection from DM" The current medical regimen is effective;  continue present plan and medications.  BP Readings from Last 3 Encounters:  08/19/11 130/70  05/21/11 140/70  02/18/11 142/76

## 2011-08-19 NOTE — Progress Notes (Signed)
  Subjective:    Patient ID: [name], male    DOB: Aug 15, 1955, 56 y.o.   MRN: [id number]  HPI Pt returns for f/u of type 1 DM (dx'ed 123XX123, complicated by peripheral sensory neuropathy, nephropathy and retinopathy).  He continues to smoke and drink etoh.  He does not check cbg's.  He takes a widely varying insulin dosage.   Past Medical History  Diagnosis Date  . Glaucoma   . HEPATITIS C     chronic  . DIABETES MELLITUS, TYPE I   . DRUG ABUSE     pt should have NO controlled substances rx'ed  . Vitiligo   . Proliferative diabetic retinopathy   . DEPRESSION   . Hypertension 02/18/2011    Past Surgical History  Procedure Date  . Eye surgery     retinal surgery x 2, right eye    History   Social History  . Marital Status: Single    Spouse Name: N/A    Number of Children: N/A  . Years of Education: N/A   Occupational History  .      disabled   Social History Main Topics  . Smoking status: Current Everyday Smoker -- 2.0 packs/day    Types: Cigarettes  . Smokeless tobacco: Not on file   Comment: Single, lives in boarding house. disable, prev mental health drug counselor.   . Alcohol Use: No  . Drug Use: Yes    Special: "Crack" cocaine     s/p rehab x 6  . Sexually Active: Not on file   Other Topics Concern  . Not on file   Social History Narrative   Lives in boarding houseOngoing occ drug use-crack    Current Outpatient Prescriptions on File Prior to Visit  Medication Sig Dispense Refill  . insulin glargine (LANTUS SOLOSTAR) 100 UNIT/ML injection       . insulin lispro (HUMALOG KWIKPEN) 100 UNIT/ML injection       . Insulin Pen Needle (B-D ULTRAFINE III SHORT PEN) 31G X 8 MM MISC Use as directed dx 250.01       . losartan (COZAAR) 25 MG tablet Take 1 tablet (25 mg total) by mouth daily.  30 tablet  11  . ONE TOUCH ULTRA TEST test strip TEST BLOOD SUGAR FOUR TIMES DAILY  100 each  5    Allergies  Allergen Reactions  . Codeine     Family History  Problem  Relation Age of Onset  . Arthritis Mother   . Heart disease Mother     CAD  . Diabetes Father   . Kidney disease Father     BP 130/70  Pulse 75  Temp(Src) 97 F (36.1 C) (Oral)  Ht 6' (1.829 m)  Wt 173 lb (78.472 kg)  BMI 23.46 kg/m2  SpO2 97%  Review of Systems He had mild hypoglycemia this am, but no loc.      Objective:   Physical Exam Pulses: dorsalis pedis intact bilat.   Feet: no deformity.  no ulcer on the feet.  feet are of normal color and temp.  no edema.  There is bilateral onychomycosis. Neuro: sensation is intact to touch on the feet, but severely decreased from normal  Lab Results  Component Value Date   HGBA1C 9.7* 08/19/2011      Assessment & Plan:  Type 1 DM. Therapy severely limited by noncompliance.  i'll do the best i can.

## 2011-08-19 NOTE — Patient Instructions (Addendum)
check your blood sugar 4 times a day--before the 3 meals, and at bedtime.  also check if you have symptoms of your blood sugar being too high or too low.  please keep a record of the readings and bring it to your next appointment here.  please call us sooner if you are having low blood sugar episodes. Please take these insulin amounts, no matter what your blood sugar is.    blood tests are being requested for you today.  You will receive a letter with results. Please schedule a follow-up appointment in 3 months.

## 2011-08-19 NOTE — Patient Instructions (Signed)
It was good to see you today. Medications reviewed, no changes at this time. STOP smoking! Please schedule followup in 6 months for blood pressure and review, call sooner if problems.

## 2011-08-19 NOTE — Assessment & Plan Note (Signed)
Mgmt ongoing by endo for Type 1 DM recommended ASA, statin - pt declines Continue ARB and regular optho checks The current medical regimen is effective;  continue present plan and medications.  Lab Results  Component Value Date   HGBA1C 9.3* 05/21/2011

## 2011-08-19 NOTE — Progress Notes (Signed)
  Subjective:    Patient ID: [name], male    DOB: Jan 01, 1956, 56 y.o.   MRN: [id number]  HPI  Here for follow up - reviewed chronic medical issues:   DM1 - follows with endo for same; dx age 31y - on insulin tx since dx - noncompliant with check cbgs due to cost of strips - denies symptoms hypoglycemia - reports compliance with ongoing medical treatment and no changes in medication dose or frequency. denies adverse side effects related to current therapy.   drug and alcohol abuse - long hx same (>30y) - intermitt use now - attends AA meetings - s/p rehab x 6 in past - in/out of boarding house but tried to stay in "cleaner" environment   hep c, chronic - related to prior IVDA - considered interferon tx but declined due to +UDS - now, pt declines due to depression hx and risk of same on tx   chronic depression - intol of meds for same - has good insight into his condition and triggers - no unexplainable sadness at this time and no SI/HI   Past Medical History  Diagnosis Date  . Glaucoma   . HEPATITIS C     chronic  . DIABETES MELLITUS, TYPE I   . DRUG ABUSE     pt should have NO controlled substances rx'ed  . Vitiligo   . Proliferative diabetic retinopathy   . DEPRESSION   . Hypertension 02/18/2011    Review of Systems  Constitutional: Negative for fever and unexpected weight change.  Respiratory: Negative for cough and shortness of breath.   Cardiovascular: Negative for chest pain.  Neurological: Negative for headaches.       Objective:   Physical Exam  BP 130/70  Pulse 75  Temp(Src) 97 F (36.1 C) (Oral)  Ht 6' (1.829 m)  Wt 173 lb 6.4 oz (78.654 kg)  BMI 23.52 kg/m2  SpO2 97% Wt Readings from Last 3 Encounters:  08/19/11 173 lb 6.4 oz (78.654 kg)  05/21/11 172 lb 4 oz (78.132 kg)  02/18/11 178 lb (80.74 kg)   Constitutional:  He appears well-developed and well-nourished. No distress.  Neck: Normal range of motion. Neck supple. No JVD present. No thyromegaly  present.  Cardiovascular: Normal rate, regular rhythm and normal heart sounds.  No murmur heard. no BLE edema Pulmonary/Chest: Effort normal and breath sounds normal. No respiratory distress. no wheezes.  Skin: vitiligo; Skin is warm and dry.  No erythema or ulceration.  Psychiatric: he has a normal mood and affect. behavior is normal. Judgment and thought content normal.   Lab Results  Component Value Date   WBC 10.1 11/20/2009   HGB 17.7* 11/20/2009   HCT 50.6 11/20/2009   PLT 303 11/20/2009   GLUCOSE 242* 02/18/2011   CHOL 188 01/11/2010   TRIG 64.0 01/11/2010   HDL 71.20 01/11/2010   LDLCALC 104* 01/11/2010   ALT 41 02/18/2011   AST 34 02/18/2011   NA 139 02/18/2011   K 4.3 02/18/2011   CL 102 02/18/2011   CREATININE 1.2 02/18/2011   BUN 13 02/18/2011   CO2 28 02/18/2011   TSH 0.71 02/18/2011   PSA 0.36 11/19/2006   HGBA1C 9.3* 05/21/2011   MICROALBUR 3.9* 01/11/2010      Assessment & Plan:  See problem list. Medications and labs reviewed today.

## 2011-08-20 ENCOUNTER — Telehealth: Payer: Self-pay | Admitting: *Deleted

## 2011-08-20 NOTE — Telephone Encounter (Signed)
Called pt to inform of A1c results, pt informed (letter also mailed to pt).

## 2011-08-29 ENCOUNTER — Telehealth: Payer: Self-pay | Admitting: *Deleted

## 2011-08-29 NOTE — Telephone Encounter (Signed)
Pt called and stated that he has considered your advisement on switching to Lantus insulin only and has decided to continue with taking both Humalog and Lantus until he follows up with you again in 3 months.

## 2011-10-23 ENCOUNTER — Encounter (HOSPITAL_COMMUNITY): Payer: Self-pay | Admitting: *Deleted

## 2011-10-23 ENCOUNTER — Emergency Department (HOSPITAL_COMMUNITY)
Admission: EM | Admit: 2011-10-23 | Discharge: 2011-10-23 | Disposition: A | Discharge status: Home / Self Care | Payer: Medicare Other | Attending: Emergency Medicine | Admitting: Emergency Medicine

## 2011-10-23 ENCOUNTER — Emergency Department (HOSPITAL_COMMUNITY): Payer: Medicare Other

## 2011-10-23 DIAGNOSIS — H409 Unspecified glaucoma: Secondary | ICD-10-CM | POA: Insufficient documentation

## 2011-10-23 DIAGNOSIS — I10 Essential (primary) hypertension: Secondary | ICD-10-CM | POA: Insufficient documentation

## 2011-10-23 DIAGNOSIS — S2249XA Multiple fractures of ribs, unspecified side, initial encounter for closed fracture: Secondary | ICD-10-CM | POA: Insufficient documentation

## 2011-10-23 DIAGNOSIS — S2239XA Fracture of one rib, unspecified side, initial encounter for closed fracture: Secondary | ICD-10-CM

## 2011-10-23 DIAGNOSIS — F17200 Nicotine dependence, unspecified, uncomplicated: Secondary | ICD-10-CM | POA: Insufficient documentation

## 2011-10-23 DIAGNOSIS — E109 Type 1 diabetes mellitus without complications: Secondary | ICD-10-CM | POA: Insufficient documentation

## 2011-10-23 DIAGNOSIS — Z794 Long term (current) use of insulin: Secondary | ICD-10-CM | POA: Insufficient documentation

## 2011-10-23 DIAGNOSIS — F3289 Other specified depressive episodes: Secondary | ICD-10-CM | POA: Insufficient documentation

## 2011-10-23 DIAGNOSIS — M549 Dorsalgia, unspecified: Secondary | ICD-10-CM | POA: Insufficient documentation

## 2011-10-23 DIAGNOSIS — Z79899 Other long term (current) drug therapy: Secondary | ICD-10-CM | POA: Insufficient documentation

## 2011-10-23 DIAGNOSIS — F329 Major depressive disorder, single episode, unspecified: Secondary | ICD-10-CM | POA: Insufficient documentation

## 2011-10-23 DIAGNOSIS — R05 Cough: Secondary | ICD-10-CM | POA: Insufficient documentation

## 2011-10-23 DIAGNOSIS — R0602 Shortness of breath: Secondary | ICD-10-CM | POA: Insufficient documentation

## 2011-10-23 DIAGNOSIS — R059 Cough, unspecified: Secondary | ICD-10-CM | POA: Insufficient documentation

## 2011-10-23 MED ORDER — HYDROCODONE-ACETAMINOPHEN 5-325 MG PO TABS
1.0000 | ORAL_TABLET | Freq: Once | ORAL | Status: AC
  Administered 2011-10-23: 1 via ORAL
  Filled 2011-10-23: qty 1

## 2011-10-23 MED ORDER — HYDROCODONE-ACETAMINOPHEN 5-325 MG PO TABS: 1.0000 | ORAL_TABLET | Freq: Four times a day (QID) | ORAL | Status: AC | PRN
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
## 2011-11-18 ENCOUNTER — Other Ambulatory Visit (INDEPENDENT_AMBULATORY_CARE_PROVIDER_SITE_OTHER): Payer: Medicare Other

## 2011-11-18 ENCOUNTER — Ambulatory Visit (INDEPENDENT_AMBULATORY_CARE_PROVIDER_SITE_OTHER): Payer: Medicare Other | Admitting: Endocrinology

## 2011-11-18 ENCOUNTER — Ambulatory Visit (INDEPENDENT_AMBULATORY_CARE_PROVIDER_SITE_OTHER): Payer: Medicare Other | Admitting: Internal Medicine

## 2011-11-18 ENCOUNTER — Encounter: Payer: Self-pay | Admitting: Internal Medicine

## 2011-11-18 ENCOUNTER — Encounter: Payer: Self-pay | Admitting: Endocrinology

## 2011-11-18 VITALS — BP 120/80 | HR 82 | Temp 97.4°F | Ht 72.0 in | Wt 166.0 lb

## 2011-11-18 VITALS — BP 128/80 | HR 82 | Temp 97.4°F | Ht 72.0 in | Wt 166.0 lb

## 2011-11-18 DIAGNOSIS — E1029 Type 1 diabetes mellitus with other diabetic kidney complication: Secondary | ICD-10-CM | POA: Insufficient documentation

## 2011-11-18 DIAGNOSIS — B1710 Acute hepatitis C without hepatic coma: Secondary | ICD-10-CM

## 2011-11-18 DIAGNOSIS — E1065 Type 1 diabetes mellitus with hyperglycemia: Secondary | ICD-10-CM

## 2011-11-18 DIAGNOSIS — N058 Unspecified nephritic syndrome with other morphologic changes: Secondary | ICD-10-CM

## 2011-11-18 DIAGNOSIS — E109 Type 1 diabetes mellitus without complications: Secondary | ICD-10-CM

## 2011-11-18 DIAGNOSIS — I10 Essential (primary) hypertension: Secondary | ICD-10-CM

## 2011-11-18 LAB — HEPATIC FUNCTION PANEL
ALT: 32 U/L (ref 0–53)
AST: 24 U/L (ref 0–37)
Albumin: 3.7 g/dL (ref 3.5–5.2)
Alkaline Phosphatase: 82 U/L (ref 39–117)
Bilirubin, Direct: 0.1 mg/dL (ref 0.0–0.3)
Total Bilirubin: 0.8 mg/dL (ref 0.3–1.2)
Total Protein: 6.9 g/dL (ref 6.0–8.3)

## 2011-11-18 MED ORDER — INSULIN LISPRO 100 UNIT/ML ~~LOC~~ SOLN: 14.0000 [IU] | Freq: Two times a day (BID) | SUBCUTANEOUS | Status: DC

## 2011-11-18 MED ORDER — INSULIN GLARGINE 100 UNIT/ML ~~LOC~~ SOLN: 22.0000 [IU] | Freq: Every day | SUBCUTANEOUS | Status: DC

## 2011-11-18 NOTE — Progress Notes (Signed)
  Subjective:    Patient ID: [name], male    DOB: 23-Oct-1955, 56 y.o.   MRN: [id number]  HPI  Here for follow up - reviewed chronic medical issues:   DM1 - follows with endo for same; dx age 66y - on insulin tx since dx - noncompliant with check cbgs due to cost of strips - denies symptoms hypoglycemia - reports compliance with ongoing medical treatment and no changes in medication dose or frequency. denies adverse side effects related to current therapy.   drug and alcohol abuse - long hx same (>30y) - intermitt but ongoing use now - attends AA meetings - s/p rehab x 6 in past - in/out of boarding house but tried to stay in "cleaner" environment   hep c, chronic - related to prior IVDA - considered interferon tx but declined due to +UDS - now, pt declines due to depression hx and risk of same on tx   chronic depression - intol of meds for same - has good insight into his condition and triggers - no unexplainable sadness at this time and no SI/HI   Past Medical History  Diagnosis Date  . Glaucoma   . HEPATITIS C     chronic  . DIABETES MELLITUS, TYPE I   . DRUG ABUSE     pt should have NO controlled substances rx'ed  . Vitiligo   . Proliferative diabetic retinopathy   . DEPRESSION   . Hypertension 02/18/2011    Review of Systems  Constitutional: Negative for fever and unexpected weight change.  Respiratory: Negative for cough and shortness of breath.   Cardiovascular: Negative for chest pain.  Neurological: Negative for headaches.       Objective:   Physical Exam  BP 120/80  Pulse 82  Temp 97.4 F (36.3 C) (Oral)  Ht 6' (1.829 m)  Wt 166 lb (75.297 kg)  BMI 22.51 kg/m2  SpO2 97% Wt Readings from Last 3 Encounters:  11/18/11 166 lb (75.297 kg)  11/18/11 166 lb (75.297 kg)  08/19/11 173 lb (78.472 kg)   Constitutional:  He appears well-developed and well-nourished. No distress.  Neck: Normal range of motion. Neck supple. No JVD present. No thyromegaly present.    Cardiovascular: Normal rate, regular rhythm and normal heart sounds.  No murmur heard. no BLE edema Pulmonary/Chest: Effort normal and breath sounds normal. No respiratory distress. no wheezes.  Skin: vitiligo changes; Skin is warm and dry.  No erythema or ulceration.  Psychiatric: he has a normal mood and affect. behavior is normal. Judgment and thought content normal.   Lab Results  Component Value Date   WBC 10.1 11/20/2009   HGB 17.7* 11/20/2009   HCT 50.6 11/20/2009   PLT 303 11/20/2009   GLUCOSE 242* 02/18/2011   CHOL 188 01/11/2010   TRIG 64.0 01/11/2010   HDL 71.20 01/11/2010   LDLCALC 104* 01/11/2010   ALT 41 02/18/2011   AST 34 02/18/2011   NA 139 02/18/2011   K 4.3 02/18/2011   CL 102 02/18/2011   CREATININE 1.2 02/18/2011   BUN 13 02/18/2011   CO2 28 02/18/2011   TSH 0.71 02/18/2011   PSA 0.36 11/19/2006   HGBA1C 9.7* 08/19/2011   MICROALBUR 3.9* 01/11/2010      Assessment & Plan:  See problem list. Medications and labs reviewed today.

## 2011-11-18 NOTE — Patient Instructions (Signed)
It was good to see you today. Test(s) ordered today. Your results will be called to you after review (48-72hours after test completion). If any changes need to be made, you will be notified at that time.  Medications reviewed, no changes at this time. STOP smoking! Continue to think about giving up cigarettes! Use nicotine gum, nicotine patches or electronic cigarettes. If you're interested in medication to help you quit, please call  - let me know how I can help! Please schedule followup in 6 months for blood pressure and review, call sooner if problems.

## 2011-11-18 NOTE — Assessment & Plan Note (Signed)
started ARB 02/2011 after reviewing indications for "renal protection from DM" The current medical regimen is effective;  continue present plan and medications.  BP Readings from Last 3 Encounters:  11/18/11 120/80  11/18/11 128/80  10/23/11 170/77

## 2011-11-18 NOTE — Progress Notes (Signed)
  Subjective:    Patient ID: [name], male    DOB: Apr 18, 1955, 56 y.o.   MRN: [id number]  HPI Pt returns for f/u of type 1 DM (dx'ed 123XX123, complicated by peripheral sensory neuropathy, nephropathy and retinopathy).  He does not check cbg's.  He ran out of insulin, and he says he will pick up a refill today.   Past Medical History  Diagnosis Date  . Glaucoma   . HEPATITIS C     chronic  . DIABETES MELLITUS, TYPE I   . DRUG ABUSE     pt should have NO controlled substances rx'ed  . Vitiligo   . Proliferative diabetic retinopathy   . DEPRESSION   . Hypertension 02/18/2011    Past Surgical History  Procedure Date  . Eye surgery     retinal surgery x 2, right eye    History   Social History  . Marital Status: Single    Spouse Name: N/A    Number of Children: N/A  . Years of Education: N/A   Occupational History  .      disabled   Social History Main Topics  . Smoking status: Current Everyday Smoker -- 2.0 packs/day    Types: Cigarettes  . Smokeless tobacco: Not on file   Comment: Single, lives in boarding house. disable, prev mental health drug counselor.   . Alcohol Use: No  . Drug Use: Yes    Special: "Crack" cocaine     s/p rehab x 6  . Sexually Active: Not on file   Other Topics Concern  . Not on file   Social History Narrative   Lives in boarding houseOngoing occ drug use-crack    Current Outpatient Prescriptions on File Prior to Visit  Medication Sig Dispense Refill  . insulin glargine (LANTUS SOLOSTAR) 100 UNIT/ML injection Inject 22 Units into the skin at bedtime.  15 mL  3  . Insulin Pen Needle (B-D ULTRAFINE III SHORT PEN) 31G X 8 MM MISC Use as directed dx 250.01       . ONE TOUCH ULTRA TEST test strip TEST BLOOD SUGAR FOUR TIMES DAILY  100 each  5  . insulin lispro (HUMALOG KWIKPEN) 100 UNIT/ML injection Inject 14 Units into the skin 2 (two) times daily.  15 mL  3    Allergies  Allergen Reactions  . Codeine Itching    Family History    Problem Relation Age of Onset  . Arthritis Mother   . Heart disease Mother     CAD  . Diabetes Father   . Kidney disease Father     BP 128/80  Pulse 82  Temp 97.4 F (36.3 C) (Oral)  Ht 6' (1.829 m)  Wt 166 lb (75.297 kg)  BMI 22.51 kg/m2  SpO2 97%   Review of Systems He has hypoglycemia approx 1/week, due to irregular meals.      Objective:   Physical Exam VITAL SIGNS:  See vs page GENERAL: no distress. Pulses: dorsalis pedis intact bilat.   Feet: no deformity.  no ulcer on the feet.  feet are of normal color and temp.  no edema.  There is bilateral onychomycosis. Neuro: sensation is intact to touch on the feet, but severely decreased from normal.        Assessment & Plan:  DM, therapy limited by noncompliance.  i'll do the best i can.  He needs a simple regmen

## 2011-11-18 NOTE — Assessment & Plan Note (Signed)
Therapy limited by noncompliance 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 Follows with endo ([facility]), continue same recommendations Lab Results  Component Value Date   HGBA1C 9.7* 08/19/2011

## 2011-11-18 NOTE — Assessment & Plan Note (Signed)
Prior evaluation 2007 by hepatologist for same - never underwent treatment to to ongoing drug abuse at the time Declines IFN due to depression hx and prior failure UDS Recheck LFTs now to monitor same

## 2011-11-18 NOTE — Patient Instructions (Addendum)
check your blood sugar 4 times a day--before the 3 meals, and at bedtime.  also check if you have symptoms of your blood sugar being too high or too low.  please keep a record of the readings and bring it to your next appointment here.  please call us sooner if you are having low blood sugar episodes. blood tests are being requested for you today.  You will receive a letter with results. Please schedule a follow-up appointment in 3 months.  Please consider changing to the lantus only.  Let me know if you decide to make that change.

## 2011-11-19 ENCOUNTER — Other Ambulatory Visit: Payer: Self-pay | Admitting: *Deleted

## 2011-11-19 MED ORDER — INSULIN LISPRO 100 UNIT/ML ~~LOC~~ SOLN: 14.0000 [IU] | Freq: Two times a day (BID) | SUBCUTANEOUS | Status: DC

## 2011-11-21 ENCOUNTER — Telehealth: Payer: Self-pay | Admitting: *Deleted

## 2011-11-21 NOTE — Telephone Encounter (Signed)
R'cd fax from [employer] for PA on [employer]. PA approved through 03/2022. [place] informed.

## 2012-02-06 ENCOUNTER — Other Ambulatory Visit: Payer: Self-pay | Admitting: Endocrinology

## 2012-02-17 ENCOUNTER — Ambulatory Visit: Payer: Medicare Other | Admitting: Internal Medicine

## 2012-02-19 ENCOUNTER — Encounter: Payer: Self-pay | Admitting: Endocrinology

## 2012-02-19 ENCOUNTER — Ambulatory Visit (INDEPENDENT_AMBULATORY_CARE_PROVIDER_SITE_OTHER): Payer: Medicare Other | Admitting: Endocrinology

## 2012-02-19 VITALS — BP 122/68 | HR 88 | Temp 98.1°F | Wt 168.0 lb

## 2012-02-19 DIAGNOSIS — B1710 Acute hepatitis C without hepatic coma: Secondary | ICD-10-CM

## 2012-02-19 DIAGNOSIS — E109 Type 1 diabetes mellitus without complications: Secondary | ICD-10-CM

## 2012-02-19 NOTE — Progress Notes (Signed)
Subjective:    Patient ID: [name], male    DOB: [birth year], 56 y.o.   MRN: [id number]  HPI Pt returns for f/u of type 1 DM (dx'ed 123XX123, complicated by peripheral sensory neuropathy, nephropathy and retinopathy; last episode of severe hypoglycemia was 2007).  He does not check cbg's.  He declines changing to lantus only.  He says he has hypoglycemia once or twice a week, with exertion.   Pt reports ongoing severe pain at the arms and legs.   Pt says he has been drug-free x 2 months.   Past Medical History  Diagnosis Date  . Glaucoma   . HEPATITIS C     chronic  . DIABETES MELLITUS, TYPE I   . DRUG ABUSE     pt should have NO controlled substances rx'ed  . Vitiligo   . 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 DEPRESSION   . Hypertension 02/18/2011    Past Surgical History  Procedure Date  . Eye surgery     retinal surgery x 2, right eye    History   Social History  . Marital Status: Single    Spouse Name: N/A    Number of Children: N/A  . Years of Education: N/A   Occupational History  .      disabled   Social History Main Topics  . Smoking status: Current Every Day Smoker -- 2.0 packs/day    Types: Cigarettes  . Smokeless tobacco: Not on file     Comment: Single, lives in boarding house. disable, prev mental health drug counselor.   . Alcohol Use: No  . Drug Use: Yes    Special: "Crack" cocaine     Comment: s/p rehab x 6  . Sexually Active: Not on file   Other Topics Concern  . Not on file   Social History Narrative   Lives in boarding houseOngoing occ drug use-crack    Current Outpatient Prescriptions on File Prior to Visit  Medication Sig Dispense Refill  . B-D ULTRAFINE III SHORT PEN 31G X 8 MM MISC USE AS DIRECTED  100 each  0  . insulin glargine (LANTUS SOLOSTAR) 100 UNIT/ML injection Inject 22 Units into the skin at bedtime.  15 mL  3  . insulin lispro (HUMALOG KWIKPEN) 100 UNIT/ML injection Inject 14 Units into the skin 2 (two) times  daily.  15 mL  3  . ONE TOUCH ULTRA TEST test strip TEST BLOOD SUGAR FOUR TIMES DAILY  100 each  1    Allergies  Allergen Reactions  . Codeine Itching    Family History  Problem Relation Age of Onset  . Arthritis Mother   . Heart disease Mother     CAD  . Diabetes Father   . Kidney disease Father     BP 122/68  Pulse 88  Temp 98.1 F (36.7 C) (Oral)  Wt 168 lb (76.204 kg)  SpO2 96%  Review of Systems Denies LOC and weight change.    Objective:   Physical Exam Pulses: dorsalis pedis intact bilat.   Feet: no deformity.  no ulcer on the feet.  feet are of normal color and temp.  no edema.  There is bilateral onychomycosis Neuro: sensation is intact to touch on the feet, but severely decreased from normal.  Lab Results  Component Value Date   HGBA1C 9.5* 02/19/2012      Assessment & Plan:  Type 1 DM.  He needs a simpler regimen Chronic drug abuse, better by pt's  report Chronic neuropathic pain.  Despite the above, he should not start on chronic narcotics

## 2012-02-19 NOTE — Patient Instructions (Addendum)
check your blood sugar 4 times a day--before the 3 meals, and at bedtime.  also check if you have symptoms of your blood sugar being too high or too low.  please keep a record of the readings and bring it to your next appointment here.  please call us sooner if you are having low blood sugar episodes. Please schedule a follow-up appointment in 3 months.   blood tests are being requested for you today.  We'll contact you with results. Some specialists say taking folic acid (also called "folate," 5 mg daily), with help your neuropathy. If exertion is upcoming, take just half of that shot of humalog.

## 2012-02-20 ENCOUNTER — Ambulatory Visit: Payer: Medicare Other | Admitting: Endocrinology

## 2012-02-20 LAB — HEPATIC FUNCTION PANEL
AST: 40 U/L — ABNORMAL HIGH (ref 0–37)
Albumin: 3.9 g/dL (ref 3.5–5.2)
Alkaline Phosphatase: 92 U/L (ref 39–117)
Total Bilirubin: 0.5 mg/dL (ref 0.3–1.2)
Total Protein: 6.6 g/dL (ref 6.0–8.3)

## 2012-04-28 ENCOUNTER — Ambulatory Visit (INDEPENDENT_AMBULATORY_CARE_PROVIDER_SITE_OTHER): Payer: Medicare Other | Admitting: Internal Medicine

## 2012-04-28 ENCOUNTER — Other Ambulatory Visit (INDEPENDENT_AMBULATORY_CARE_PROVIDER_SITE_OTHER): Payer: Medicare Other

## 2012-04-28 ENCOUNTER — Encounter: Payer: Self-pay | Admitting: Internal Medicine

## 2012-04-28 VITALS — BP 132/68 | HR 90 | Temp 98.9°F

## 2012-04-28 DIAGNOSIS — B1710 Acute hepatitis C without hepatic coma: Secondary | ICD-10-CM

## 2012-04-28 DIAGNOSIS — R0989 Other specified symptoms and signs involving the circulatory and respiratory systems: Secondary | ICD-10-CM

## 2012-04-28 DIAGNOSIS — F17200 Nicotine dependence, unspecified, uncomplicated: Secondary | ICD-10-CM

## 2012-04-28 DIAGNOSIS — R3919 Other difficulties with micturition: Secondary | ICD-10-CM

## 2012-04-28 DIAGNOSIS — R0600 Dyspnea, unspecified: Secondary | ICD-10-CM

## 2012-04-28 DIAGNOSIS — R39198 Other difficulties with micturition: Secondary | ICD-10-CM

## 2012-04-28 DIAGNOSIS — M545 Low back pain: Secondary | ICD-10-CM

## 2012-04-28 DIAGNOSIS — R509 Fever, unspecified: Secondary | ICD-10-CM

## 2012-04-28 LAB — POCT URINALYSIS DIPSTICK
Bilirubin, UA: NEGATIVE
Blood, UA: NEGATIVE
Nitrite, UA: NEGATIVE
pH, UA: 5

## 2012-04-28 LAB — BASIC METABOLIC PANEL
BUN: 14 mg/dL (ref 6–23)
CO2: 26 mEq/L (ref 19–32)
Calcium: 9.5 mg/dL (ref 8.4–10.5)
Creatinine, Ser: 0.9 mg/dL (ref 0.4–1.5)
Glucose, Bld: 151 mg/dL — ABNORMAL HIGH (ref 70–99)
Sodium: 130 mEq/L — ABNORMAL LOW (ref 135–145)

## 2012-04-28 LAB — URINALYSIS, ROUTINE W REFLEX MICROSCOPIC
Specific Gravity, Urine: 1.025 (ref 1.000–1.030)
Total Protein, Urine: 100
Urine Glucose: 500
pH: 6 (ref 5.0–8.0)

## 2012-04-28 LAB — HEPATIC FUNCTION PANEL
AST: 39 U/L — ABNORMAL HIGH (ref 0–37)
Alkaline Phosphatase: 247 U/L — ABNORMAL HIGH (ref 39–117)
Bilirubin, Direct: 0.2 mg/dL (ref 0.0–0.3)
Total Bilirubin: 1.3 mg/dL — ABNORMAL HIGH (ref 0.3–1.2)

## 2012-04-28 LAB — CBC WITH DIFFERENTIAL/PLATELET
Basophils Absolute: 0 10*3/uL (ref 0.0–0.1)
Eosinophils Absolute: 0 10*3/uL (ref 0.0–0.7)
Hemoglobin: 15.6 g/dL (ref 13.0–17.0)
Lymphocytes Relative: 18.7 % (ref 12.0–46.0)
Lymphs Abs: 1.6 10*3/uL (ref 0.7–4.0)
MCHC: 34.3 g/dL (ref 30.0–36.0)
Neutro Abs: 6.2 10*3/uL (ref 1.4–7.7)
RDW: 13.2 % (ref 11.5–14.6)

## 2012-04-28 NOTE — Patient Instructions (Signed)
It was good to see you today. Test(s) ordered today. Your results will be released to [place] (or called to you) after review, usually within 72hours after test completion. If any changes need to be made, you will be notified at that same time. we'll make referral for pulmonary function tests to evaluate for COPD changes . Our office will contact you regarding appointment(s) once made. Continue to think about giving up cigarettes! Use nicotine gum, nicotine patches or electronic cigarettes. If you're interested in medication to help you quit, please call  - let me know how I can help!

## 2012-04-28 NOTE — Progress Notes (Signed)
  Subjective:    Patient ID: [name], male    DOB: 05-15-1955, 57 y.o.   MRN: [id number]  HPI See CC No radiation of pain to groin No new meds, no trauma Onset symptoms <48h ago - slightly improved since onset but not resolved  Past Medical History  Diagnosis Date  . Glaucoma   . HEPATITIS C     chronic  . DIABETES MELLITUS, TYPE I   . DRUG ABUSE     pt should have NO controlled substances rx'ed  . Vitiligo   . 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 DEPRESSION   . Hypertension 02/18/2011    Review of Systems  Constitutional: Positive for fever and fatigue.  Respiratory: Positive for shortness of breath. Negative for cough, chest tightness and wheezing.   Gastrointestinal: Negative for nausea, vomiting, diarrhea and constipation.  Skin: Negative for rash and wound.  Neurological: Negative for dizziness, weakness and headaches.       Objective:   Physical Exam BP 132/68  Pulse 90  Temp 98.9 F (37.2 C) (Oral)  SpO2 97% Wt Readings from Last 3 Encounters:  02/19/12 168 lb (76.204 kg)  11/18/11 166 lb (75.297 kg)  11/18/11 166 lb (75.297 kg)   Constitutional:  He appears well-developed and well-nourished. No distress. Nontoxic, no jaundice Neck: Normal range of motion. Neck supple. No JVD present. No thyromegaly present.  Cardiovascular: Normal rate, regular rhythm and normal heart sounds.  No murmur heard. no BLE edema Pulmonary/Chest: Effort normal and breath sounds normal. No respiratory distress. no wheezes.  Abdominal: Soft. Bowel sounds are normal. Patient exhibits no distension. There is no tenderness. No reproducible flank pain Neurological: he is alert and oriented to person, place, and time. No cranial nerve deficit. Coordination normal.  Skin: Skin is warm and dry.  No rash, erythema or ulceration.  Psychiatric: he has a normal mood and affect. behavior is normal. Judgment and thought content normal.   Lab Results  Component Value Date   WBC  10.1 11/20/2009   HGB 17.7* 11/20/2009   HCT 50.6 11/20/2009   PLT 303 11/20/2009   GLUCOSE 242* 02/18/2011   CHOL 188 01/11/2010   TRIG 64.0 01/11/2010   HDL 71.20 01/11/2010   LDLCALC 104* 01/11/2010   ALT 71* 02/19/2012   AST 40* 02/19/2012   NA 139 02/18/2011   K 4.3 02/18/2011   CL 102 02/18/2011   CREATININE 1.2 02/18/2011   BUN 13 02/18/2011   CO2 28 02/18/2011   TSH 0.71 02/18/2011   PSA 0.36 11/19/2006   HGBA1C 9.5* 02/19/2012   MICROALBUR 3.9* 01/11/2010       Assessment & Plan:   Change urine color - rule out hematuria, infection or biliuria L flank pain - Fever and chills SOB/tobacco abuse - cxr 10/2011 reviewed - mild hyperinflation  Check UA and labs Refer for PFTs Advised smoking cessation

## 2012-04-28 NOTE — Assessment & Plan Note (Signed)
Prior evaluation 2007 by hepatologist for same - never underwent treatment to to ongoing drug abuse at the time Declines IFN due to depression hx and prior failure UDS Recheck LFTs now to monitor same

## 2012-05-05 ENCOUNTER — Other Ambulatory Visit (INDEPENDENT_AMBULATORY_CARE_PROVIDER_SITE_OTHER): Payer: Medicare Other

## 2012-05-05 ENCOUNTER — Telehealth: Payer: Self-pay | Admitting: *Deleted

## 2012-05-05 DIAGNOSIS — B1710 Acute hepatitis C without hepatic coma: Secondary | ICD-10-CM

## 2012-05-05 LAB — HEPATIC FUNCTION PANEL: Total Bilirubin: 0.4 mg/dL (ref 0.3–1.2)

## 2012-05-05 NOTE — Telephone Encounter (Signed)
Pt was told to come back in 1 week to have liver function check. Need order in comp...lmb

## 2012-05-20 ENCOUNTER — Ambulatory Visit: Payer: Medicare Other | Admitting: Internal Medicine

## 2012-05-22 ENCOUNTER — Encounter: Payer: Self-pay | Admitting: Endocrinology

## 2012-05-22 ENCOUNTER — Ambulatory Visit (INDEPENDENT_AMBULATORY_CARE_PROVIDER_SITE_OTHER): Payer: Medicare Other | Admitting: Endocrinology

## 2012-05-22 VITALS — BP 134/80 | HR 76 | Wt 167.0 lb

## 2012-05-22 DIAGNOSIS — E109 Type 1 diabetes mellitus without complications: Secondary | ICD-10-CM

## 2012-05-22 LAB — HEMOGLOBIN A1C: Hgb A1c MFr Bld: 8.6 % — ABNORMAL HIGH (ref 4.6–6.5)

## 2012-05-22 NOTE — Patient Instructions (Addendum)
check your blood sugar 4 times a day--before the 3 meals, and at bedtime.  also check if you have symptoms of your blood sugar being too high or too low.  please keep a record of the readings and bring it to your next appointment here.  please call us sooner if you are having low blood sugar episodes. Please schedule a follow-up appointment in 3 months.   blood tests are being requested for you today.  We'll contact you with results.   If exertion is upcoming, take just half of that shot of humalog.   Take humalog 3 times a day (just before each meal) 03-26-14 units.   Please reduce the lantus to 20 units at bedtime good diet and exercise habits significanly improve the control of your diabetes.  please let me know if you wish to be referred to a dietician.  high blood sugar is very risky to your health.  you should see an eye doctor every year.  You are at higher than average risk for pneumonia and hepatitis-B.  You should be vaccinated against both.   controlling your blood pressure and cholesterol drastically reduces the damage diabetes does to your body.  this also applies to quitting smoking.  please discuss these with your doctor.  you should take an aspirin every day, unless you have been advised by a doctor not to.

## 2012-05-22 NOTE — Progress Notes (Signed)
  Subjective:    Patient ID: [name], male    DOB: July 01, 1955, 57 y.o.   MRN: [id number]  HPI Pt returns for f/u of type 1 DM (dx'ed 123XX123, complicated by peripheral sensory neuropathy, nephropathy and retinopathy; last episode of severe hypoglycemia was 2007; he may do better with a simpler qd regimen, but he has declined this).  He has hypoglycemia approx 2-3/week, usually before breakfast or before lunch.   Past Medical History  Diagnosis Date  . Glaucoma   . HEPATITIS C     chronic  . DIABETES MELLITUS, TYPE I   . DRUG ABUSE     pt should have NO controlled substances rx'ed  . Vitiligo   . 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 DEPRESSION   . Hypertension 02/18/2011    Past Surgical History  Procedure Date  . Eye surgery     retinal surgery x 2, right eye    History   Social History  . Marital Status: Single    Spouse Name: N/A    Number of Children: N/A  . Years of Education: N/A   Occupational History  .      disabled   Social History Main Topics  . Smoking status: Current Every Day Smoker -- 2.0 packs/day    Types: Cigarettes  . Smokeless tobacco: Not on file     Comment: Single, lives in boarding house. disable, prev mental health drug counselor.   . Alcohol Use: No  . Drug Use: Yes    Special: "Crack" cocaine     Comment: s/p rehab x 6  . Sexually Active: Not on file   Other Topics Concern  . Not on file   Social History Narrative   Lives in boarding houseOngoing occ drug use-crack    Current Outpatient Prescriptions on File Prior to Visit  Medication Sig Dispense Refill  . B-D ULTRAFINE III SHORT PEN 31G X 8 MM MISC USE AS DIRECTED  100 each  0  . insulin glargine (LANTUS) 100 UNIT/ML injection Inject 20 Units into the skin at bedtime.      . insulin lispro (HUMALOG) 100 UNIT/ML injection 3 times a day (just before each meal) 03-26-14 units.      . ONE TOUCH ULTRA TEST test strip TEST BLOOD SUGAR FOUR TIMES DAILY  100 each  1     Allergies  Allergen Reactions  . Codeine Itching    Family History  Problem Relation Age of Onset  . Arthritis Mother   . Heart disease Mother     CAD  . Diabetes Father   . Kidney disease Father     BP 134/80  Pulse 76  Wt 167 lb (75.751 kg)  SpO2 96%  Review of Systems Denies LOC    Objective:   Physical Exam Pulses: dorsalis pedis intact bilat.   Feet: no deformity.  no ulcer on the feet.  feet are of normal color and temp.  no edema.  There is bilateral onychomycosis Neuro: sensation is intact to touch on the feet, but severely decreased from normal.     Assessment & Plan:  DM: with apparently improved control

## 2012-06-08 ENCOUNTER — Ambulatory Visit (INDEPENDENT_AMBULATORY_CARE_PROVIDER_SITE_OTHER): Payer: Medicare Other | Admitting: Internal Medicine

## 2012-06-08 DIAGNOSIS — R0602 Shortness of breath: Secondary | ICD-10-CM

## 2012-06-08 LAB — PULMONARY FUNCTION TEST

## 2012-06-08 NOTE — Progress Notes (Signed)
PFT done today. 

## 2012-06-12 ENCOUNTER — Telehealth: Payer: Self-pay | Admitting: Internal Medicine

## 2012-06-12 NOTE — Telephone Encounter (Signed)
Patient is requesting a call back from our office with his pulmonary results

## 2012-06-15 NOTE — Telephone Encounter (Signed)
Called pt no answer LMOM md response...lmb

## 2012-06-15 NOTE — Telephone Encounter (Signed)
It does not appear the PFTs have been read yet - please check on this and let pt know we will contact him with results once available -thanks

## 2012-06-17 ENCOUNTER — Telehealth: Payer: Self-pay | Admitting: *Deleted

## 2012-06-17 DIAGNOSIS — J449 Chronic obstructive pulmonary disease, unspecified: Secondary | ICD-10-CM | POA: Insufficient documentation

## 2012-06-17 NOTE — Telephone Encounter (Signed)
PFTs show mild COPD - confirms what we saw on prior CXR Pt should stop smoking No medications recommended unless symptomatic: cough, dyspnea on exertion or shortness of breath - If symptoms, please make OV to review symptoms and med options thanks

## 2012-06-17 NOTE — Telephone Encounter (Signed)
PATIENT CALLED TO REQEUST THE RESULTS OF HIS PULMONARY FUNCTION TEST. CB# [phone]

## 2012-06-17 NOTE — Telephone Encounter (Signed)
PATIENT NOTIFIED OF RESULTS OF PFT. NO QUESTIONS VOICED.

## 2012-07-14 ENCOUNTER — Other Ambulatory Visit: Payer: Self-pay | Admitting: Endocrinology

## 2012-07-14 ENCOUNTER — Other Ambulatory Visit: Payer: Self-pay | Admitting: *Deleted

## 2012-07-14 ENCOUNTER — Other Ambulatory Visit: Payer: Self-pay

## 2012-07-14 MED ORDER — INSULIN LISPRO 100 UNIT/ML ~~LOC~~ SOLN: SUBCUTANEOUS | Status: DC

## 2012-07-14 MED ORDER — INSULIN LISPRO 100 UNIT/ML ~~LOC~~ SOLN: 3.0000 [IU] | Freq: Three times a day (TID) | SUBCUTANEOUS | Status: DC

## 2012-08-18 ENCOUNTER — Other Ambulatory Visit: Payer: Self-pay | Admitting: *Deleted

## 2012-08-18 ENCOUNTER — Other Ambulatory Visit: Payer: Self-pay | Admitting: Endocrinology

## 2012-08-18 MED ORDER — INSULIN GLARGINE 100 UNIT/ML SOLOSTAR PEN: 20.0000 [IU] | PEN_INJECTOR | Freq: Every day | SUBCUTANEOUS | Status: DC

## 2012-08-21 ENCOUNTER — Ambulatory Visit: Payer: Medicare Other | Admitting: Endocrinology

## 2012-09-04 ENCOUNTER — Encounter: Payer: Self-pay | Admitting: Endocrinology

## 2012-09-04 ENCOUNTER — Ambulatory Visit (INDEPENDENT_AMBULATORY_CARE_PROVIDER_SITE_OTHER): Payer: Medicare Other | Admitting: Endocrinology

## 2012-09-04 VITALS — BP 126/80 | HR 87 | Ht 72.0 in | Wt 175.0 lb

## 2012-09-04 DIAGNOSIS — E109 Type 1 diabetes mellitus without complications: Secondary | ICD-10-CM

## 2012-09-04 DIAGNOSIS — E1029 Type 1 diabetes mellitus with other diabetic kidney complication: Secondary | ICD-10-CM

## 2012-09-04 DIAGNOSIS — E1065 Type 1 diabetes mellitus with hyperglycemia: Secondary | ICD-10-CM

## 2012-09-04 LAB — MICROALBUMIN / CREATININE URINE RATIO: Microalb, Ur: 55.6 mg/dL — ABNORMAL HIGH (ref 0.0–1.9)

## 2012-09-04 LAB — HEMOGLOBIN A1C: Hgb A1c MFr Bld: 9.1 % — ABNORMAL HIGH (ref 4.6–6.5)

## 2012-09-04 MED ORDER — CEPHALEXIN 500 MG PO CAPS: 500.0000 mg | ORAL_CAPSULE | Freq: Three times a day (TID) | ORAL | Status: DC

## 2012-09-04 NOTE — Patient Instructions (Addendum)
check your blood sugar 4 times a day--before the 3 meals, and at bedtime.  also check if you have symptoms of your blood sugar being too high or too low.  please keep a record of the readings and bring it to your next appointment here.  please call us sooner if you are having low blood sugar episodes. Please schedule a follow-up appointment in 3 months.   A diabetes blood test is requested for you today.  We'll contact you with results.   If exertion is upcoming, take just half of that shot of humalog.   Take humalog 3 times a day (just before each meal) 03-28-14 units.  It is not safe to take extra insulin.   Please continue the lantus, 20 units at bedtime.   i have sent a prescription to your pharmacy, for an antibiotic pill.  This does not substitute for dental care.  You should see a dentist as soon as possible.

## 2012-09-04 NOTE — Progress Notes (Signed)
Subjective:    Patient ID: [name], male    DOB: [birth year], 57 y.o.   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  no cbg record, but states cbg's are mildly low approx 2-3 times per week..  This happens when he takes extra insulin.  He says cbg's are highest in the afternoon.   Pt states 1 week of moderate pain at the left upper dental area, but no assoc fever.   Past Medical History  Diagnosis Date  . Glaucoma   . HEPATITIS C     chronic  . DIABETES MELLITUS, TYPE I   . DRUG ABUSE     pt should have NO controlled substances rx'ed  . Vitiligo   . 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 DEPRESSION   . Hypertension 02/18/2011    Past Surgical History  Procedure Laterality Date  . Eye surgery      retinal surgery x 2, right eye    History   Social History  . Marital Status: Single    Spouse Name: N/A    Number of Children: N/A  . Years of Education: N/A   Occupational History  .      disabled   Social History Main Topics  . Smoking status: Current Every Day Smoker -- 2.00 packs/day    Types: Cigarettes  . Smokeless tobacco: Not on file     Comment: Single, lives in boarding house. disable, prev mental health drug counselor.   . Alcohol Use: No  . Drug Use: Yes    Special: "Crack" cocaine     Comment: s/p rehab x 6  . Sexually Active: Not on file   Other Topics Concern  . Not on file   Social History Narrative   Lives in boarding house   Ongoing occ drug use-crack    Current Outpatient Prescriptions on File Prior to Visit  Medication Sig Dispense Refill  . B-D ULTRAFINE III SHORT PEN 31G X 8 MM MISC USE AS DIRECTED  100 each  0  . Insulin Glargine (LANTUS SOLOSTAR) 100 UNIT/ML SOPN Inject 20 Units into  the skin at bedtime.  15 pen  PRN  . LANTUS SOLOSTAR 100 UNIT/ML SOPN INJECT 22 UNITS INTO THE SKIN EVERY NIGHT AT BEDTIME  15 mL  0  . ONE TOUCH ULTRA TEST test strip TEST BLOOD SUGAR FOUR TIMES DAILY  100 each  1   No current facility-administered medications on file prior to visit.    Allergies  Allergen Reactions  . Codeine Itching    Family History  Problem Relation Age of Onset  . Arthritis Mother   . Heart disease Mother     CAD  . Diabetes Father   . Kidney disease Father     BP 126/80  Pulse 87  Ht 6' (1.829 m)  Wt 175 lb (79.379 kg)  BMI 23.73 kg/m2  SpO2 98%  Review of Systems No change in chronic leg pain.  Denies weight change.      Objective:   Physical Exam VITAL SIGNS:  See vs page GENERAL: no distress     Assessment & Plan:  Dental pain, new DM: uncertain control.  This regimen was chosen to match his insulin to his changing insulin  needs throughout the day.  However, he may need a simpler regimen.

## 2012-09-18 ENCOUNTER — Ambulatory Visit (INDEPENDENT_AMBULATORY_CARE_PROVIDER_SITE_OTHER): Payer: Medicare Other | Admitting: Endocrinology

## 2012-09-18 ENCOUNTER — Encounter: Payer: Self-pay | Admitting: Endocrinology

## 2012-09-18 VITALS — BP 134/72 | HR 75 | Ht 72.0 in | Wt 174.0 lb

## 2012-09-18 DIAGNOSIS — E109 Type 1 diabetes mellitus without complications: Secondary | ICD-10-CM

## 2012-09-18 MED ORDER — INSULIN DETEMIR 100 UNIT/ML FLEXPEN: 55.0000 [IU] | PEN_INJECTOR | SUBCUTANEOUS | Status: DC

## 2012-09-18 NOTE — Progress Notes (Signed)
Subjective:    Patient ID: [name], male    DOB: 14-May-1955, 57 y.o.   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  no cbg record, but states cbg's are mildly low approx twice a week..  This happens when he takes extra insulin.   He has many years of moderate swelling at the insulin sites at the anterior thighs, but no assoc pain.  Past Medical History  Diagnosis Date  . Glaucoma   . HEPATITIS C     chronic  . DIABETES MELLITUS, TYPE I   . DRUG ABUSE     pt should have NO controlled substances rx'ed  . Vitiligo   . 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 DEPRESSION   . Hypertension 02/18/2011    Past Surgical History  Procedure Laterality Date  . Eye surgery      retinal surgery x 2, right eye    History   Social History  . Marital Status: Single    Spouse Name: N/A    Number of Children: N/A  . Years of Education: N/A   Occupational History  .      disabled   Social History Main Topics  . Smoking status: Current Every Day Smoker -- 2.00 packs/day    Types: Cigarettes  . Smokeless tobacco: Not on file     Comment: Single, lives in boarding house. disable, prev mental health drug counselor.   . Alcohol Use: No  . Drug Use: Yes    Special: "Crack" cocaine     Comment: s/p rehab x 6  . Sexually Active: Not on file   Other Topics Concern  . Not on file   Social History Narrative   Lives in boarding house   Ongoing occ drug use-crack    Current Outpatient Prescriptions on File Prior to Visit  Medication Sig Dispense Refill  . B-D ULTRAFINE III SHORT PEN 31G X 8 MM MISC USE AS DIRECTED  100 each  0  . insulin lispro (HUMALOG) 100 UNIT/ML injection 2 units for any blood sugar over 250      . ONE TOUCH ULTRA  TEST test strip TEST BLOOD SUGAR FOUR TIMES DAILY  100 each  1   No current facility-administered medications on file prior to visit.    Allergies  Allergen Reactions  . Codeine Itching    Family History  Problem Relation Age of Onset  . Arthritis Mother   . Heart disease Mother     CAD  . Diabetes Father   . Kidney disease Father    BP 134/72  Pulse 75  Ht 6' (1.829 m)  Wt 174 lb (78.926 kg)  BMI 23.59 kg/m2  SpO2 98%  Review of Systems Denies LOC and weight change    Objective:   Physical Exam VITAL SIGNS:  See vs page GENERAL: no distress.   SKIN:  Insulin injection sites at the anterior thighs are normal.    Lab Results  Component Value Date   HGBA1C 9.1* 09/04/2012      Assessment & Plan:  DM: This insulin regimen was chosen from multiple options, for its simplicity.  The benefits of glycemic control must be weighed against the risks of hypoglycemia.   Hypertrophy of sq fat  of insulin injections, new.

## 2012-09-18 NOTE — Patient Instructions (Addendum)
For now, avoid injecting the insulin into the thighs.  The stomach is better.  check your blood sugar 4 times a day--before the 3 meals, and at bedtime.  also check if you have symptoms of your blood sugar being too high or too low.  please keep a record of the readings and bring it to your next appointment here.  please call us sooner if you are having low blood sugar episodes. Please schedule a follow-up appointment in 2 weeks. Change lantus to levemir, 55 units each morning. Take humalog, just 2 units any time the blood sugar is over 250.

## 2012-09-28 ENCOUNTER — Other Ambulatory Visit: Payer: Self-pay | Admitting: *Deleted

## 2012-09-28 MED ORDER — GLUCOSE BLOOD VI STRP: ORAL_STRIP | Status: DC

## 2012-09-30 ENCOUNTER — Other Ambulatory Visit: Payer: Self-pay

## 2012-09-30 ENCOUNTER — Telehealth: Payer: Self-pay | Admitting: Internal Medicine

## 2012-09-30 MED ORDER — INSULIN DETEMIR 100 UNIT/ML FLEXPEN: 55.0000 [IU] | PEN_INJECTOR | SUBCUTANEOUS | Status: DC

## 2012-09-30 NOTE — Telephone Encounter (Signed)
Please call in Levemir FlexPen 1000 units/mL, 3 mL. Pt states he began taking this a week or two ago and has ran out. Please call this in to his [facility] on [address] in [place]. Thank you.

## 2012-10-02 ENCOUNTER — Ambulatory Visit (INDEPENDENT_AMBULATORY_CARE_PROVIDER_SITE_OTHER): Payer: Medicare Other | Admitting: Endocrinology

## 2012-10-02 ENCOUNTER — Encounter: Payer: Self-pay | Admitting: Endocrinology

## 2012-10-02 VITALS — BP 126/78 | HR 78 | Ht 72.0 in | Wt 178.0 lb

## 2012-10-02 DIAGNOSIS — E1029 Type 1 diabetes mellitus with other diabetic kidney complication: Secondary | ICD-10-CM

## 2012-10-02 DIAGNOSIS — E109 Type 1 diabetes mellitus without complications: Secondary | ICD-10-CM

## 2012-10-02 DIAGNOSIS — E1065 Type 1 diabetes mellitus with hyperglycemia: Secondary | ICD-10-CM

## 2012-10-02 MED ORDER — INSULIN DETEMIR 100 UNIT/ML FLEXPEN: 50.0000 [IU] | PEN_INJECTOR | SUBCUTANEOUS | Status: DC

## 2012-10-02 NOTE — Progress Notes (Signed)
  Subjective:    Patient ID: [name], male    DOB: December 31, 1955, 57 y.o.   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  he brings a record of his cbg's which i have reviewed today.  It varies from 43-306, but most are in the 100's.  It is in general higher as the day goes on Past Medical History  Diagnosis Date  . Glaucoma   . HEPATITIS C     chronic  . DIABETES MELLITUS, TYPE I   . DRUG ABUSE     pt should have NO controlled substances rx'ed  . Vitiligo   . 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 DEPRESSION   . Hypertension 02/18/2011    Past Surgical History  Procedure Laterality Date  . Eye surgery      retinal surgery x 2, right eye    History   Social History  . Marital Status: Single    Spouse Name: N/A    Number of Children: N/A  . Years of Education: N/A   Occupational History  .      disabled   Social History Main Topics  . Smoking status: Current Every Day Smoker -- 2.00 packs/day    Types: Cigarettes  . Smokeless tobacco: Not on file     Comment: Single, lives in boarding house. disable, prev mental health drug counselor.   . Alcohol Use: No  . Drug Use: Yes    Special: "Crack" cocaine     Comment: s/p rehab x 6  . Sexually Active: Not on file   Other Topics Concern  . Not on file   Social History Narrative   Lives in boarding house   Ongoing occ drug use-crack    Current Outpatient Prescriptions on File Prior to Visit  Medication Sig Dispense Refill  . B-D ULTRAFINE III SHORT PEN 31G X 8 MM MISC USE AS DIRECTED  100 each  0  . insulin lispro (HUMALOG) 100 UNIT/ML injection 2 units for any blood sugar over 250       No  current facility-administered medications on file prior to visit.    Allergies  Allergen Reactions  . Codeine Itching    Family History  Problem Relation Age of Onset  . Arthritis Mother   . Heart disease Mother     CAD  . Diabetes Father   . Kidney disease Father     BP 126/78  Pulse 78  Ht 6' (1.829 m)  Wt 178 lb (80.74 kg)  BMI 24.14 kg/m2  SpO2 97%  Review of Systems Denies LOC and weight change.    Objective:   Physical Exam VITAL SIGNS:  See vs page GENERAL: no distress PSYCH: Alert and oriented x 3.  Does not appear anxious nor depressed.     Lab Results  Component Value Date   HGBA1C 9.1* 09/04/2012      Assessment & Plan:  DM: This insulin regimen was chosen from multiple options, for its simplicity.  The benefits of glycemic control must be weighed against the risks of hypoglycemia.  Slightly overcontrolled.

## 2012-10-02 NOTE — Patient Instructions (Addendum)
check your blood sugar 4 times a day--before the 3 meals, and at bedtime.  also check if you have symptoms of your blood sugar being too high or too low.  please keep a record of the readings and bring it to your next appointment here.  please call us sooner if you are having low blood sugar episodes. Please schedule a follow-up appointment in 1 month. Please reduce the levemir to 50 units each morning. Take humalog, just 2 units any time the blood sugar is over 250.

## 2012-10-07 ENCOUNTER — Other Ambulatory Visit (INDEPENDENT_AMBULATORY_CARE_PROVIDER_SITE_OTHER): Payer: Medicare Other

## 2012-10-07 ENCOUNTER — Encounter: Payer: Self-pay | Admitting: Internal Medicine

## 2012-10-07 ENCOUNTER — Ambulatory Visit (INDEPENDENT_AMBULATORY_CARE_PROVIDER_SITE_OTHER): Payer: Medicare Other | Admitting: Internal Medicine

## 2012-10-07 VITALS — BP 152/72 | HR 81 | Temp 98.1°F | Wt 180.1 lb

## 2012-10-07 DIAGNOSIS — E109 Type 1 diabetes mellitus without complications: Secondary | ICD-10-CM

## 2012-10-07 DIAGNOSIS — E1065 Type 1 diabetes mellitus with hyperglycemia: Secondary | ICD-10-CM

## 2012-10-07 DIAGNOSIS — E1029 Type 1 diabetes mellitus with other diabetic kidney complication: Secondary | ICD-10-CM

## 2012-10-07 DIAGNOSIS — B1710 Acute hepatitis C without hepatic coma: Secondary | ICD-10-CM

## 2012-10-07 LAB — HEPATIC FUNCTION PANEL
Albumin: 3.6 g/dL (ref 3.5–5.2)
Alkaline Phosphatase: 71 U/L (ref 39–117)
Total Protein: 7.4 g/dL (ref 6.0–8.3)

## 2012-10-07 LAB — BASIC METABOLIC PANEL
BUN: 14 mg/dL (ref 6–23)
Calcium: 9.7 mg/dL (ref 8.4–10.5)
Chloride: 103 mEq/L (ref 96–112)
Creatinine, Ser: 0.8 mg/dL (ref 0.4–1.5)
GFR: 111.07 mL/min (ref >60.00)
Potassium: 4.3 mEq/L (ref 3.5–5.1)
Sodium: 137 mEq/L (ref 135–145)

## 2012-10-07 LAB — MICROALBUMIN / CREATININE URINE RATIO: Microalb, Ur: 56.2 mg/dL — ABNORMAL HIGH (ref 0.0–1.9)

## 2012-10-07 LAB — LIPID PANEL
Cholesterol: 172 mg/dL (ref 0–200)
HDL: 61.9 mg/dL (ref >39.00)
LDL Cholesterol: 95 mg/dL (ref 0–99)
Total CHOL/HDL Ratio: 3
Triglycerides: 76 mg/dL (ref 0.0–149.0)
VLDL: 15.2 mg/dL (ref 0.0–40.0)

## 2012-10-07 MED ORDER — ASPIRIN EC 81 MG PO TBEC: 81.0000 mg | DELAYED_RELEASE_TABLET | Freq: Every day | ORAL | Status: DC

## 2012-10-07 MED ORDER — LOSARTAN POTASSIUM 25 MG PO TABS: 25.0000 mg | ORAL_TABLET | Freq: Every day | ORAL | Status: DC

## 2012-10-07 NOTE — Assessment & Plan Note (Signed)
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

## 2012-10-07 NOTE — Progress Notes (Signed)
  Subjective:    Patient ID: [name], male    DOB: [birth year], 57 y.o.   MRN: [id number]  HPI  Here for follow up   Past Medical History  Diagnosis Date  . Glaucoma   . HEPATITIS C     chronic  . DIABETES MELLITUS, TYPE I   . DRUG ABUSE     pt should have NO controlled substances rx'ed  . Vitiligo   . 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 DEPRESSION   . Hypertension 02/18/2011    Review of Systems  Constitutional: Positive for fatigue. Negative for fever and unexpected weight change.  Respiratory: Negative for cough, chest tightness and wheezing.   Cardiovascular: Negative for chest pain and leg swelling.  Neurological: Negative for dizziness and headaches.       Objective:   Physical Exam  BP 152/72  Pulse 81  Temp(Src) 98.1 F (36.7 C) (Oral)  Wt 180 lb 1.9 oz (81.702 kg)  BMI 24.42 kg/m2  SpO2 97% Wt Readings from Last 3 Encounters:  10/07/12 180 lb 1.9 oz (81.702 kg)  10/02/12 178 lb (80.74 kg)  09/18/12 174 lb (78.926 kg)   Constitutional:  He appears well-developed and well-nourished. No distress.  Neck: Normal range of motion. Neck supple. No JVD present. No thyromegaly present.  Cardiovascular: Normal rate, regular rhythm and normal heart sounds.  No murmur heard. no BLE edema Pulmonary/Chest: Effort normal and breath sounds normal. No respiratory distress. no wheezes.  Skin: Marked vitiligo changes -Skin is warm and dry.  No erythema or ulceration.  Psychiatric: he has a normal mood and affect. behavior is normal. Judgment and thought content normal.   Lab Results  Component Value Date   WBC 8.4 04/28/2012   HGB 15.6 04/28/2012   HCT 45.6 04/28/2012   PLT 235.0 04/28/2012   GLUCOSE 151* 04/28/2012   CHOL 188 01/11/2010   TRIG 64.0 01/11/2010   HDL 71.20 01/11/2010   LDLCALC 104* 01/11/2010   ALT 38 05/05/2012   AST 31 05/05/2012   NA 130* 04/28/2012   K 4.2 04/28/2012   CL 96 04/28/2012   CREATININE 0.9 04/28/2012   BUN 14 04/28/2012   CO2 26  04/28/2012   TSH 0.71 02/18/2011   PSA 0.36 11/19/2006   HGBA1C 9.1* 09/04/2012   MICROALBUR 55.6* 09/04/2012       Assessment & Plan:   See problem list. Medications and labs reviewed today.

## 2012-10-07 NOTE — Assessment & Plan Note (Signed)
Prior evaluation 2007 by hepatologist for same - never underwent treatment to to ongoing drug abuse at the time Declines IFN due to depression hx and prior failure UDS Recheck LFTs now to monitor same

## 2012-10-07 NOTE — Patient Instructions (Signed)
It was good to see you today. Test(s) ordered today. Your results will be released to [facility] (or called to you) after review, usually within 72hours after test completion. If any changes need to be made, you will be notified at that same time. Medications reviewed and updated, start losartan once daily and baby aspirin Continue to think about giving up cigarettes! Use nicotine gum, nicotine patches or electronic cigarettes. If you're interested in medication to help you quit, please call  - let me know how I can help! Please schedule followup in 6 months, call sooner if problems.

## 2012-10-09 ENCOUNTER — Telehealth: Payer: Self-pay

## 2012-10-09 NOTE — Telephone Encounter (Signed)
Patient called [facility] requesting recent lab results.

## 2012-10-09 NOTE — Telephone Encounter (Signed)
Called pt yesterday left msg on his cell number with md response. Called pt back @ [phone] gave md response...[name]

## 2012-11-02 ENCOUNTER — Ambulatory Visit: Payer: Medicare Other | Admitting: Endocrinology

## 2012-11-20 ENCOUNTER — Other Ambulatory Visit: Payer: Self-pay | Admitting: Endocrinology

## 2012-11-23 ENCOUNTER — Ambulatory Visit: Payer: Medicare Other | Admitting: Endocrinology

## 2012-11-23 DIAGNOSIS — Z0289 Encounter for other administrative examinations: Secondary | ICD-10-CM

## 2012-12-07 ENCOUNTER — Ambulatory Visit: Payer: Medicare Other | Admitting: Endocrinology

## 2012-12-21 ENCOUNTER — Other Ambulatory Visit: Payer: Self-pay | Admitting: Endocrinology

## 2012-12-21 ENCOUNTER — Other Ambulatory Visit: Payer: Self-pay

## 2012-12-21 ENCOUNTER — Other Ambulatory Visit: Payer: Self-pay | Admitting: *Deleted

## 2012-12-21 MED ORDER — INSULIN LISPRO 100 UNIT/ML ~~LOC~~ SOLN: SUBCUTANEOUS | Status: DC

## 2012-12-21 NOTE — Telephone Encounter (Signed)
Needs Humalog samples, out x2 days. Pt coming by the office to see if we have samples, he does not not have a return phone number / Sherri

## 2013-01-13 ENCOUNTER — Ambulatory Visit (INDEPENDENT_AMBULATORY_CARE_PROVIDER_SITE_OTHER): Payer: Medicare Other | Admitting: Endocrinology

## 2013-01-13 ENCOUNTER — Encounter: Payer: Self-pay | Admitting: Endocrinology

## 2013-01-13 VITALS — BP 126/70 | HR 80 | Wt 176.0 lb

## 2013-01-13 DIAGNOSIS — E1029 Type 1 diabetes mellitus with other diabetic kidney complication: Secondary | ICD-10-CM

## 2013-01-13 DIAGNOSIS — B1710 Acute hepatitis C without hepatic coma: Secondary | ICD-10-CM

## 2013-01-13 DIAGNOSIS — E109 Type 1 diabetes mellitus without complications: Secondary | ICD-10-CM

## 2013-01-13 LAB — HEMOGLOBIN A1C: Hgb A1c MFr Bld: 7.9 % — ABNORMAL HIGH (ref 4.6–6.5)

## 2013-01-13 LAB — BASIC METABOLIC PANEL
Calcium: 9.4 mg/dL (ref 8.4–10.5)
Creatinine, Ser: 1 mg/dL (ref 0.4–1.5)
GFR: 85.01 mL/min (ref >60.00)
Glucose, Bld: 71 mg/dL (ref 70–99)
Potassium: 4.2 mEq/L (ref 3.5–5.1)
Sodium: 141 mEq/L (ref 135–145)

## 2013-01-13 LAB — HEPATIC FUNCTION PANEL
ALT: 24 U/L (ref 0–53)
AST: 22 U/L (ref 0–37)
Alkaline Phosphatase: 62 U/L (ref 39–117)
Total Bilirubin: 0.4 mg/dL (ref 0.3–1.2)

## 2013-01-13 NOTE — Progress Notes (Signed)
Subjective:    Patient ID: [name], male    DOB: 24-Feb-1956, 57 y.o.   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  He ran out of levemir a few days ago.  he brings a record of his cbg's which i have reviewed today.  It varies from 43-300, but most are in the 100's.  He feels the hypoglycemia in am is due to taking prn humalog in the evening.  He says he continues to struggle with difficult life circumstances.   Past Medical History  Diagnosis Date  . Glaucoma   . HEPATITIS C     chronic  . DIABETES MELLITUS, TYPE I   . DRUG ABUSE     pt should have NO controlled substances rx'ed  . Vitiligo   . 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 DEPRESSION   . Hypertension 02/18/2011    Past Surgical History  Procedure Laterality Date  . Eye surgery      retinal surgery x 2, right eye    History   Social History  . Marital Status: Single    Spouse Name: N/A    Number of Children: N/A  . Years of Education: N/A   Occupational History  .      disabled   Social History Main Topics  . Smoking status: Current Every Day Smoker -- 2.00 packs/day    Types: Cigarettes  . Smokeless tobacco: Not on file     Comment: Single, lives in boarding house. disable, prev mental health drug counselor.   . Alcohol Use: No  . Drug Use: Yes    Special: "Crack" cocaine     Comment: s/p rehab x 6  . Sexual Activity: Not on file   Other Topics Concern  . Not on file   Social History Narrative   Lives in boarding house   Ongoing occ drug use-crack    Current Outpatient Prescriptions on File Prior to Visit  Medication Sig Dispense Refill  . aspirin EC 81 MG tablet  Take 1 tablet (81 mg total) by mouth daily.  150 tablet  2  . B-D ULTRAFINE III SHORT PEN 31G X 8 MM MISC USE AS DIRECTED  100 each  0  . Insulin Detemir 100 UNIT/ML SOPN Inject 50 Units into the skin every morning. And pen needles 2/day  10 pen  11  . insulin lispro (HUMALOG) 100 UNIT/ML injection 2 units for any blood sugar over 250  10 mL  0  . losartan (COZAAR) 25 MG tablet Take 1 tablet (25 mg total) by mouth daily.  90 tablet  3  . ONE TOUCH ULTRA TEST test strip TEST BLOOD SUGAR FOUR TIMES DAILY  150 each  2   No current facility-administered medications on file prior to visit.   Allergies  Allergen Reactions  . Codeine Itching   Family History  Problem Relation Age of Onset  . Arthritis Mother   . Heart disease Mother     CAD  . Diabetes Father   . Kidney disease Father    BP 126/70  Pulse 80  Wt 176 lb (79.833 kg)  BMI 23.86 kg/m2  SpO2 98%  Review of Systems Denies LOC and weight change.    Objective:   Physical Exam VITAL SIGNS:  See vs page GENERAL: no distress  Lab Results  Component Value Date   HGBA1C 7.9* 01/13/2013      Assessment & Plan:  DM: DM: This insulin regimen was chosen from multiple options, for its simplicity.  The benefits of glycemic control must be weighed against the risks of hypoglycemia.  this is the best control this pt should aim for, given this regimen, which does match insulin to his changing needs throughout the day. Difficult life circumstances.  These complicate the rx of DM.

## 2013-01-13 NOTE — Patient Instructions (Addendum)
check your blood sugar 4 times a day--before the 3 meals, and at bedtime.  also check if you have symptoms of your blood sugar being too high or too low.  please keep a record of the readings and bring it to your next appointment here.  please call us sooner if you are having low blood sugar episodes. Please schedule a follow-up appointment in January. blood tests are being requested for you today.  We'll contact you with results. Please continue the levemir, 50 units each morning. Take humalog, just 2 units any time the blood sugar is over 250.   Another option would be to change to NPH each morning.  Please consider and let me know.

## 2013-02-22 ENCOUNTER — Telehealth: Payer: Self-pay | Admitting: *Deleted

## 2013-02-22 NOTE — Telephone Encounter (Addendum)
[facility] left vm for return call. Returned call they wanted to change his rx Levemir that has been discontinued. Gave ok for change.

## 2013-03-03 ENCOUNTER — Telehealth: Payer: Self-pay | Admitting: Endocrinology

## 2013-03-03 NOTE — Telephone Encounter (Signed)
Left message again for patient, letting him know insulin was in fridge with his name on it, ready to be picked up.

## 2013-03-19 ENCOUNTER — Telehealth: Payer: Self-pay

## 2013-03-19 NOTE — Telephone Encounter (Signed)
Check cbg every 1/2 hr x 5 hrs.  Take po sugar as needed.  After 5 hrs, no need to worry.

## 2013-03-19 NOTE — Telephone Encounter (Signed)
Patient informed and instructed to call back if any issue arises.

## 2013-03-19 NOTE — Telephone Encounter (Signed)
Patient injected 50 units of Humalog this morning instead of the Levemir. Patient wanted to know if Levemir should still taken and what advise to be given about keeping sugar stable? Patient has been taking pure sugar and checking sugar since dosage was taken.   Thanks!

## 2013-04-14 ENCOUNTER — Ambulatory Visit: Payer: Medicare Other | Admitting: Internal Medicine

## 2013-04-19 ENCOUNTER — Other Ambulatory Visit (INDEPENDENT_AMBULATORY_CARE_PROVIDER_SITE_OTHER): Payer: Medicare Other

## 2013-04-19 ENCOUNTER — Encounter: Payer: Self-pay | Admitting: Endocrinology

## 2013-04-19 ENCOUNTER — Encounter: Payer: Self-pay | Admitting: Internal Medicine

## 2013-04-19 ENCOUNTER — Ambulatory Visit (INDEPENDENT_AMBULATORY_CARE_PROVIDER_SITE_OTHER): Payer: Medicare Other | Admitting: Endocrinology

## 2013-04-19 ENCOUNTER — Ambulatory Visit (INDEPENDENT_AMBULATORY_CARE_PROVIDER_SITE_OTHER): Payer: Medicare Other | Admitting: Internal Medicine

## 2013-04-19 VITALS — BP 136/58 | HR 91 | Temp 97.8°F | Ht 72.0 in | Wt 166.0 lb

## 2013-04-19 VITALS — BP 132/72 | HR 94 | Temp 97.0°F | Wt 165.4 lb

## 2013-04-19 DIAGNOSIS — E1065 Type 1 diabetes mellitus with hyperglycemia: Principal | ICD-10-CM

## 2013-04-19 DIAGNOSIS — M25579 Pain in unspecified ankle and joints of unspecified foot: Secondary | ICD-10-CM

## 2013-04-19 DIAGNOSIS — Z23 Encounter for immunization: Secondary | ICD-10-CM

## 2013-04-19 DIAGNOSIS — B1710 Acute hepatitis C without hepatic coma: Secondary | ICD-10-CM

## 2013-04-19 DIAGNOSIS — E1029 Type 1 diabetes mellitus with other diabetic kidney complication: Secondary | ICD-10-CM

## 2013-04-19 DIAGNOSIS — E11359 Type 2 diabetes mellitus with proliferative diabetic retinopathy without macular edema: Secondary | ICD-10-CM

## 2013-04-19 DIAGNOSIS — I10 Essential (primary) hypertension: Secondary | ICD-10-CM

## 2013-04-19 LAB — HEPATIC FUNCTION PANEL
ALK PHOS: 81 U/L (ref 39–117)
ALT: 38 U/L (ref 0–53)
AST: 35 U/L (ref 0–37)
Albumin: 4 g/dL (ref 3.5–5.2)
Bilirubin, Direct: 0.1 mg/dL (ref 0.0–0.3)
TOTAL PROTEIN: 7.8 g/dL (ref 6.0–8.3)
Total Bilirubin: 0.8 mg/dL (ref 0.3–1.2)

## 2013-04-19 LAB — BASIC METABOLIC PANEL
BUN: 16 mg/dL (ref 6–23)
CALCIUM: 9.8 mg/dL (ref 8.4–10.5)
CO2: 30 meq/L (ref 19–32)
Chloride: 97 mEq/L (ref 96–112)
Creatinine, Ser: 0.9 mg/dL (ref 0.4–1.5)
GFR: 88.06 mL/min (ref >60.00)
Glucose, Bld: 396 mg/dL — ABNORMAL HIGH (ref 70–99)
Potassium: 4.7 mEq/L (ref 3.5–5.1)
SODIUM: 136 meq/L (ref 135–145)

## 2013-04-19 LAB — HEMOGLOBIN A1C: Hgb A1c MFr Bld: 8.5 % — ABNORMAL HIGH (ref 4.6–6.5)

## 2013-04-19 NOTE — Assessment & Plan Note (Signed)
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

## 2013-04-19 NOTE — Progress Notes (Signed)
  Subjective:    Patient ID: [name], male    DOB: June 20, 1955, 58 y.o.   MRN: [id number]  HPI  Here for follow up - reviewed chronic medical issues and interval medical events  Past Medical History  Diagnosis Date  . Glaucoma   . HEPATITIS C     chronic  . DIABETES MELLITUS, TYPE I   . DRUG ABUSE     pt should have NO controlled substances rx'ed  . Vitiligo   . 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 DEPRESSION   . Hypertension 02/18/2011    Review of Systems  Constitutional: Positive for fatigue. Negative for fever and unexpected weight change.  Respiratory: Negative for cough, chest tightness and wheezing.   Cardiovascular: Negative for chest pain and leg swelling.  Neurological: Negative for dizziness and headaches.       Objective:   Physical Exam  BP 132/72  Pulse 94  Temp(Src) 97 F (36.1 C) (Oral)  Wt 165 lb 6.4 oz (75.025 kg)  SpO2 97% Wt Readings from Last 3 Encounters:  04/19/13 165 lb 6.4 oz (75.025 kg)  04/19/13 166 lb (75.297 kg)  01/13/13 176 lb (79.833 kg)   Constitutional:  He appears well-developed and well-nourished. No distress.  Neck: Normal range of motion. Neck supple. No JVD present. No thyromegaly present.  Cardiovascular: Normal rate, regular rhythm and normal heart sounds.  No murmur heard. no BLE edema Pulmonary/Chest: Effort normal and breath sounds normal. No respiratory distress. no wheezes.  Skin: Marked vitiligo changes -Skin is warm and dry.  No erythema or ulceration.  Psychiatric: he has a normal mood and affect. behavior is normal. Judgment and thought content normal.   Lab Results  Component Value Date   WBC 8.4 04/28/2012   HGB 15.6 04/28/2012   HCT 45.6 04/28/2012   PLT 235.0 04/28/2012   GLUCOSE 71 01/13/2013   CHOL 172 10/07/2012   TRIG 76.0 10/07/2012   HDL 61.90 10/07/2012   LDLCALC 95 10/07/2012   ALT 24 01/13/2013   AST 22 01/13/2013   NA 141 01/13/2013   K 4.2 01/13/2013   CL 106 01/13/2013   CREATININE 1.0  01/13/2013   BUN 15 01/13/2013   CO2 29 01/13/2013   TSH 0.71 02/18/2011   PSA 0.36 11/19/2006   HGBA1C 7.9* 01/13/2013   MICROALBUR 56.2* 10/07/2012       Assessment & Plan:   See problem list. Medications and labs reviewed today.

## 2013-04-19 NOTE — Progress Notes (Signed)
Subjective:    Patient ID: [name], male    DOB: 10/11/1955, 58 y.o.   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  He wants to continue the levemir for now.  no cbg record, because he ran out of strips.  pt states he feels well in general, except for unchanged chronic foot pain.   Past Medical History  Diagnosis Date  . Glaucoma   . HEPATITIS C     chronic  . DIABETES MELLITUS, TYPE I   . DRUG ABUSE     pt should have NO controlled substances rx'ed  . Vitiligo   . 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 DEPRESSION   . Hypertension 02/18/2011    Past Surgical History  Procedure Laterality Date  . Eye surgery      retinal surgery x 2, right eye    History   Social History  . Marital Status: Single    Spouse Name: N/A    Number of Children: N/A  . Years of Education: N/A   Occupational History  .      disabled   Social History Main Topics  . Smoking status: Current Every Day Smoker -- 2.00 packs/day    Types: Cigarettes  . Smokeless tobacco: Not on file     Comment: Single, lives in boarding house. disable, prev mental health drug counselor.   . Alcohol Use: No  . Drug Use: Yes    Special: "Crack" cocaine     Comment: s/p rehab x 6  . Sexual Activity: Not on file   Other Topics Concern  . Not on file   Social History Narrative   Lives in boarding house   Ongoing occ drug use-crack    Current Outpatient Prescriptions on File Prior to Visit  Medication Sig Dispense Refill  . aspirin EC 81 MG tablet Take 1 tablet (81 mg total) by mouth daily.  150 tablet  2   . B-D ULTRAFINE III SHORT PEN 31G X 8 MM MISC USE AS DIRECTED  100 each  0  . Insulin Detemir 100 UNIT/ML SOPN Inject 50 Units into the skin every morning. And pen needles 2/day  10 pen  11  . insulin lispro (HUMALOG) 100 UNIT/ML injection 2 units for any blood sugar over 250  10 mL  0  . losartan (COZAAR) 25 MG tablet Take 1 tablet (25 mg total) by mouth daily.  90 tablet  3  . ONE TOUCH ULTRA TEST test strip TEST BLOOD SUGAR FOUR TIMES DAILY  150 each  2   No current facility-administered medications on file prior to visit.   Allergies  Allergen Reactions  . Codeine Itching   Family History  Problem Relation Age of Onset  . Arthritis Mother   . Heart disease Mother     CAD  . Diabetes Father   . Kidney disease Father    BP 136/58  Pulse 91  Temp(Src) 97.8 F (36.6 C) (Oral)  Ht 6' (1.829 m)  Wt 166 lb (75.297 kg)  BMI 22.51 kg/m2  SpO2 97%  Review  of Systems He is unaware of any hypoglycemia.  He has lost a few lbs.       Objective:   Physical Exam VITAL SIGNS:  See vs page GENERAL: no distress.   Lab Results  Component Value Date   HGBA1C 8.5* 04/19/2013      Assessment & Plan:  DM: This insulin regimen was chosen from multiple options, for its simplicity.  The benefits of glycemic control must be weighed against the risks of hypoglycemia.  He needs increased rx. Foot and leg pain: probably neuropathic, but arterial insuff should be excluded.

## 2013-04-19 NOTE — Patient Instructions (Addendum)
It was good to see you today.  Pneumonia vaccine updated today  We have reviewed your prior records including labs and tests today  Test(s) ordered today. Your results will be released to [place] (or called to you) after review, usually within 72hours after test completion. If any changes need to be made, you will be notified at that same time.  Medications reviewed and updated, no changes recommended  we'll make referral to eye doctor for diabetes mellitus check . Our office will contact you regarding appointment(s) once made.  Continue to think about giving up cigarettes! Use nicotine gum, nicotine patches or electronic cigarettes. If you're interested in medication to help you quit, please call  - let me know how I can help!  Please schedule followup in 6 months, call sooner if problems.  Diabetes and Standards of Medical Care  Diabetes is complicated. You may find that your diabetes team includes a dietitian, nurse, diabetes educator, eye doctor, and more. To help everyone know what is going on and to help you get the care you deserve, the following schedule of care was developed to help keep you on track. Below are the tests, exams, vaccines, medicines, education, and plans you will need. HbA1c test This test shows how well you have controlled your glucose over the past 2 3 months. It is used to see if your diabetes management plan needs to be adjusted.   It is performed at least 2 times a year if you are meeting treatment goals.  It is performed 4 times a year if therapy has changed or if you are not meeting treatment goals. Blood pressure test  This test is performed at every routine medical visit. The goal is less than 140/90 mmHg for most people, but 130/80 mmHg in some cases. Ask your health care provider about your goal. Dental exam  Follow up with the dentist regularly. Eye exam  If you are diagnosed with type 1 diabetes as a child, get an exam upon reaching the age of 34  years or older and have had diabetes for 3 5 years. Yearly eye exams are recommended after that initial eye exam.  If you are diagnosed with type 1 diabetes as an adult, get an exam within 5 years of diagnosis and then yearly.  If you are diagnosed with type 2 diabetes, get an exam as soon as possible after the diagnosis and then yearly. Foot care exam  Visual foot exams are performed at every routine medical visit. The exams check for cuts, injuries, or other problems with the feet.  A comprehensive foot exam should be done yearly. This includes visual inspection as well as assessing foot pulses and testing for loss of sensation.  Check your feet nightly for cuts, injuries, or other problems with your feet. Tell your health care provider if anything is not healing. Kidney function test (urine microalbumin)  This test is performed once a year.  Type 1 diabetes: The first test is performed 5 years after diagnosis.  Type 2 diabetes: The first test is performed at the time of diagnosis.  A serum creatinine and estimated glomerular filtration rate (eGFR) test is done once a year to assess the level of chronic kidney disease (CKD), if present. Lipid profile (cholesterol, HDL, LDL, triglycerides)  Performed every 5 years for most people.  The goal for LDL is less than 100 mg/dL. If you are at high risk, the goal is less than 70 mg/dL.  The goal for HDL is 40  mg/dL 50 mg/dL for men and 50 mg/dL 60 mg/dL for women. An HDL cholesterol of 60 mg/dL or higher gives some protection against heart disease.  The goal for triglycerides is less than 150 mg/dL. Influenza vaccine, pneumococcal vaccine, and hepatitis B vaccine  The influenza vaccine is recommended yearly.  The pneumococcal vaccine is generally given once in a lifetime. However, there are some instances when another vaccination is recommended. Check with your health care provider.  The hepatitis B vaccine is also recommended for adults  with diabetes. Diabetes self-management education  Education is recommended at diagnosis and ongoing as needed. Treatment plan  Your treatment plan is reviewed at every medical visit. Document Released: 01/27/2009 Document Revised: 12/02/2012 Document Reviewed: 09/01/2012 [facility] Patient Information 2014 [place].

## 2013-04-19 NOTE — Patient Instructions (Addendum)
check your blood sugar 4 times a day--before the 3 meals, and at bedtime.  also check if you have symptoms of your blood sugar being too high or too low.  please keep a record of the readings and bring it to your next appointment here.  please call us sooner if you are having low blood sugar episodes. Please schedule a follow-up appointment in 3 months. blood tests are being requested for you today, at the [place] office.  We'll contact you with results. Please continue the levemir, 50 units each morning. Take humalog, just 2 units any time the blood sugar is over 250.  Let's check a test of the circulation of the legs.  you will receive a phone call, about a day and time for an appointment.

## 2013-04-19 NOTE — Assessment & Plan Note (Signed)
Mgmt ongoing by endo for Type 1 DM Refer for optho check -   Lab Results  Component Value Date   HGBA1C 7.9* 01/13/2013

## 2013-04-19 NOTE — Assessment & Plan Note (Signed)
Prior evaluation 2007 by hepatologist for same - never underwent treatment to to ongoing drug abuse at the time Declines IFN due to depression hx and prior failure UDS Recheck LFTs now to monitor same

## 2013-04-19 NOTE — Assessment & Plan Note (Signed)
started ARB 02/2011 after reviewing indications for "renal protection from DM" The current medical regimen is effective;  continue present plan and medications.  BP Readings from Last 3 Encounters:  04/19/13 132/72  04/19/13 136/58  01/13/13 126/70

## 2013-04-19 NOTE — Progress Notes (Signed)
Pre-visit discussion using our clinic review tool. No additional management support is needed unless otherwise documented below in the visit note.  

## 2013-04-20 ENCOUNTER — Telehealth: Payer: Self-pay | Admitting: Endocrinology

## 2013-04-20 NOTE — Telephone Encounter (Signed)
please call patient: a1c is high Please increase insulin to 55 units qam Please come back for a follow-up appointment in 3 months

## 2013-04-21 ENCOUNTER — Encounter (HOSPITAL_COMMUNITY): Payer: Medicare Other

## 2013-04-21 NOTE — Telephone Encounter (Signed)
Pt informed

## 2013-04-26 ENCOUNTER — Encounter: Payer: Self-pay | Admitting: Cardiology

## 2013-04-26 ENCOUNTER — Other Ambulatory Visit (HOSPITAL_COMMUNITY): Payer: Self-pay | Admitting: Cardiology

## 2013-04-26 ENCOUNTER — Ambulatory Visit (HOSPITAL_COMMUNITY): Payer: Medicare Other | Attending: Endocrinology

## 2013-04-26 DIAGNOSIS — I739 Peripheral vascular disease, unspecified: Secondary | ICD-10-CM

## 2013-04-26 DIAGNOSIS — J4489 Other specified chronic obstructive pulmonary disease: Secondary | ICD-10-CM | POA: Insufficient documentation

## 2013-04-26 DIAGNOSIS — F17200 Nicotine dependence, unspecified, uncomplicated: Secondary | ICD-10-CM | POA: Insufficient documentation

## 2013-04-26 DIAGNOSIS — I70209 Unspecified atherosclerosis of native arteries of extremities, unspecified extremity: Secondary | ICD-10-CM | POA: Insufficient documentation

## 2013-04-26 DIAGNOSIS — J449 Chronic obstructive pulmonary disease, unspecified: Secondary | ICD-10-CM | POA: Insufficient documentation

## 2013-04-26 DIAGNOSIS — E1159 Type 2 diabetes mellitus with other circulatory complications: Secondary | ICD-10-CM

## 2013-04-26 DIAGNOSIS — M25579 Pain in unspecified ankle and joints of unspecified foot: Secondary | ICD-10-CM

## 2013-04-26 DIAGNOSIS — E119 Type 2 diabetes mellitus without complications: Secondary | ICD-10-CM | POA: Insufficient documentation

## 2013-04-29 ENCOUNTER — Other Ambulatory Visit: Payer: Self-pay | Admitting: Endocrinology

## 2013-04-29 DIAGNOSIS — I739 Peripheral vascular disease, unspecified: Secondary | ICD-10-CM | POA: Insufficient documentation

## 2013-05-18 ENCOUNTER — Ambulatory Visit: Payer: Medicare Other | Admitting: Cardiovascular Disease

## 2013-07-06 ENCOUNTER — Encounter: Payer: Self-pay | Admitting: Cardiovascular Disease

## 2013-07-06 ENCOUNTER — Ambulatory Visit (INDEPENDENT_AMBULATORY_CARE_PROVIDER_SITE_OTHER): Payer: Medicare Other | Admitting: Cardiovascular Disease

## 2013-07-06 VITALS — BP 160/79 | HR 76 | Ht 72.0 in | Wt 168.0 lb

## 2013-07-06 DIAGNOSIS — R0602 Shortness of breath: Secondary | ICD-10-CM

## 2013-07-06 DIAGNOSIS — Z720 Tobacco use: Secondary | ICD-10-CM | POA: Insufficient documentation

## 2013-07-06 DIAGNOSIS — F17200 Nicotine dependence, unspecified, uncomplicated: Secondary | ICD-10-CM

## 2013-07-06 DIAGNOSIS — I739 Peripheral vascular disease, unspecified: Secondary | ICD-10-CM

## 2013-07-06 DIAGNOSIS — I10 Essential (primary) hypertension: Secondary | ICD-10-CM

## 2013-07-06 NOTE — Progress Notes (Signed)
HPI  This is a pleasant 58 year old man who was referred by Dr. Loanne Drilling for evaluation and management of peripheral arterial disease. He has prolonged history of type 1 diabetes since he was 58 years old complicated by severe peripheral neuropathy with poor balance. He has prolonged history of tobacco use and currently smokes one pack per day with mild COPD. Previous history of drug and alcohol abuse with known history of hepatitis C. He reports a gradual decline in functional capacity with significant exertional dyspnea without chest discomfort. He has chronic bilateral feet numbness and pain due to known neuropathy. No clear claudication. No lower extremity ulcerations or wounds. Noninvasive evaluation revealed normal  ABI but with calcified vessels. 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 There was possibility of mild inflow disease as well.  Allergies  Allergen Reactions  . Codeine Itching     Current Outpatient Prescriptions on File Prior to Visit  Medication Sig Dispense Refill  . B-D ULTRAFINE III SHORT PEN 31G X 8 MM MISC USE AS DIRECTED  100 each  0  . Insulin Detemir (LEVEMIR) 100 UNIT/ML Pen Inject 55 Units into the skin every morning. And pen needles 2/day      . insulin lispro (HUMALOG) 100 UNIT/ML injection 2 units for any blood sugar over 250  10 mL  0  . ONE TOUCH ULTRA TEST test strip TEST BLOOD SUGAR FOUR TIMES DAILY  150 each  2   No current facility-administered medications on file prior to visit.     Past Medical History  Diagnosis Date  . Glaucoma   . HEPATITIS C     chronic  . DIABETES MELLITUS, TYPE I   . DRUG ABUSE     pt should have NO controlled substances rx'ed  . Vitiligo   . 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 DEPRESSION   . Hypertension 02/18/2011     Past Surgical History  Procedure Laterality Date  . Eye surgery      retinal surgery x 2, right eye     Family History    Problem Relation Age of Onset  . Arthritis Mother   . Heart disease Mother     CAD  . Diabetes Father   . Kidney disease Father      History   Social History  . Marital Status: Single    Spouse Name: N/A    Number of Children: N/A  . Years of Education: N/A   Occupational History  .      disabled   Social History Main Topics  . Smoking status: Current Every Day Smoker -- 2.00 packs/day    Types: Cigarettes  . Smokeless tobacco: Not on file     Comment: Single, lives in boarding house. disable, prev mental health drug counselor.   . Alcohol Use: No  . Drug Use: Yes    Special: "Crack" cocaine     Comment: s/p rehab x 6  . Sexual Activity: Not on file   Other Topics Concern  . Not on file   Social History Narrative   Lives in boarding house   Ongoing occ drug use-crack     ROS A 10 point review of system was performed. It is negative other than that mentioned in the history of present illness.   PHYSICAL EXAM   BP 160/79  Pulse 76  Ht 6' (1.829 m)  Wt 168 lb (76.204 kg)  BMI 22.78 kg/m2 Constitutional: He is  oriented to person, place, and time. He appears well-developed and well-nourished. No distress.  HENT: No nasal discharge.  Head: Normocephalic and atraumatic.  Eyes: Pupils are equal and round.  No discharge. Neck: Normal range of motion. Neck supple. No JVD present. No thyromegaly present. No carotid bruits Cardiovascular: Normal rate, regular rhythm, normal heart sounds. Exam reveals no gallop and no friction rub. No murmur heard.  Pulmonary/Chest: Effort normal and breath sounds normal. No stridor. No respiratory distress. He has no wheezes. He has no rales. He exhibits no tenderness.  Abdominal: Soft. Bowel sounds are normal. He exhibits no distension. There is no tenderness. There is no rebound and no guarding.  Musculoskeletal: Normal range of motion. He exhibits no edema and no tenderness.  Neurological: He is alert and oriented to person,  place, and time. Coordination normal.  Skin: Skin is warm and dry. No rash noted. He is not diaphoretic. No erythema. No pallor.  Psychiatric: He has a normal mood and affect. His behavior is normal. Judgment and thought content normal.  Vascular: Radial pulses : +1 bilaterally. Femoral pulse: +2 bilaterally. Dorsalis pedis: +1 bilaterally. Posterior tibial :absent bilaterally     EKG: Normal sinus rhythm with no significant ST or T wave changes.   ASSESSMENT AND PLAN

## 2013-07-06 NOTE — Assessment & Plan Note (Signed)
He reports significant exertional dyspnea without chest pain. This could be related to prolonged tobacco use. However, he has multiple risk factors for coronary artery disease and established history of peripheral arterial disease. Thus, I recommend ischemic cardiac evaluation with a pharmacologic nuclear stress test. He is not able to exercise on a treadmil due to severe neuropathy.

## 2013-07-06 NOTE — Assessment & Plan Note (Signed)
I had a prolonged discussion with him about the importance of smoking cessation in order to prevent progression of peripheral arterial disease. I referred him to the smoking cessation class at [facility].

## 2013-07-06 NOTE — Assessment & Plan Note (Addendum)
The patient has evidence of peripheral arterial disease mainly below the knee bilaterally. He has no convincing symptoms of claudication. Most of his symptoms seem to be related to peripheral neuropathy. Thus, no indication for revascularization at this time. He has no evidence of critical limb ischemia. I recommend starting aspirin 81 mg once daily and aggressive treatment of his risk factors. I especially stressed the importance of smoking cessation to him.  He might benefit from treatment with a statin given that he is also diabetic. However, we have to balance against the risk of liver toxicity given his known history of untreated hepatitis C.

## 2013-07-06 NOTE — Patient Instructions (Signed)
Start Aspirin 81 mg daily   Schedule Lexiscan Myoview follow instructions given   Refer to smoking sensation class at [place] wants you to follow-up in: 6 months. You will receive a reminder letter in the mail two months in advance. If you don't receive a letter, please call our office to schedule the follow-up appointment.

## 2013-07-06 NOTE — Assessment & Plan Note (Signed)
His blood pressure is elevated today. He is not on any antihypertensive medication. If blood pressure continues to be elevated, I recommend treatment with an ACE inhibitor (Ramipril is preferred in patients with PAD).

## 2013-07-16 ENCOUNTER — Other Ambulatory Visit: Payer: Self-pay | Admitting: Endocrinology

## 2013-07-19 ENCOUNTER — Ambulatory Visit (HOSPITAL_COMMUNITY): Payer: Medicare Other | Attending: Cardiovascular Disease | Admitting: Radiology

## 2013-07-19 VITALS — BP 148/73 | HR 73 | Ht 72.0 in | Wt 164.0 lb

## 2013-07-19 DIAGNOSIS — R002 Palpitations: Secondary | ICD-10-CM | POA: Insufficient documentation

## 2013-07-19 DIAGNOSIS — J449 Chronic obstructive pulmonary disease, unspecified: Secondary | ICD-10-CM | POA: Insufficient documentation

## 2013-07-19 DIAGNOSIS — R0989 Other specified symptoms and signs involving the circulatory and respiratory systems: Secondary | ICD-10-CM | POA: Insufficient documentation

## 2013-07-19 DIAGNOSIS — J4489 Other specified chronic obstructive pulmonary disease: Secondary | ICD-10-CM | POA: Insufficient documentation

## 2013-07-19 DIAGNOSIS — E119 Type 2 diabetes mellitus without complications: Secondary | ICD-10-CM | POA: Insufficient documentation

## 2013-07-19 DIAGNOSIS — I10 Essential (primary) hypertension: Secondary | ICD-10-CM | POA: Insufficient documentation

## 2013-07-19 DIAGNOSIS — B1920 Unspecified viral hepatitis C without hepatic coma: Secondary | ICD-10-CM | POA: Insufficient documentation

## 2013-07-19 DIAGNOSIS — F17200 Nicotine dependence, unspecified, uncomplicated: Secondary | ICD-10-CM | POA: Insufficient documentation

## 2013-07-19 DIAGNOSIS — R0602 Shortness of breath: Secondary | ICD-10-CM

## 2013-07-19 DIAGNOSIS — R0609 Other forms of dyspnea: Secondary | ICD-10-CM | POA: Insufficient documentation

## 2013-07-19 DIAGNOSIS — R42 Dizziness and giddiness: Secondary | ICD-10-CM | POA: Insufficient documentation

## 2013-07-19 DIAGNOSIS — I739 Peripheral vascular disease, unspecified: Secondary | ICD-10-CM

## 2013-07-19 MED ORDER — REGADENOSON 0.4 MG/5ML IV SOLN
0.4000 mg | Freq: Once | INTRAVENOUS | Status: AC
  Administered 2013-07-19: 0.4 mg via INTRAVENOUS

## 2013-07-19 MED ORDER — TECHNETIUM TC 99M SESTAMIBI GENERIC - CARDIOLITE
33.0000 | Freq: Once | INTRAVENOUS | Status: AC | PRN
  Administered 2013-07-19: 33 via INTRAVENOUS

## 2013-07-19 MED ORDER — TECHNETIUM TC 99M SESTAMIBI GENERIC - CARDIOLITE
11.0000 | Freq: Once | INTRAVENOUS | Status: AC | PRN
  Administered 2013-07-19: 11 via INTRAVENOUS

## 2013-07-19 NOTE — Progress Notes (Signed)
  [place] [facility] [place] [address] [place], [place] [address] [phone]    Cardiology Nuclear Med Study  [name] is a 58 y.o. male     MRN : [id number]     DOB: 06-13-1955  Procedure Date: 07/19/2013  Nuclear Med Background Indication for Stress Test:  Evaluation for Ischemia History:  No known CAD, COPD, Hep C, Hx. drug/alcohol abuse Cardiac Risk Factors: Hypertension, Smoker and IDDM  Symptoms:  Dizziness, DOE, Palpitations and SOB   Nuclear Pre-Procedure Caffeine/Decaff Intake:  None NPO After: 10:00pm   Lungs:  clear O2 Sat: 98% on room air. IV 0.9% NS with Angio Cath:  20g  IV Site: R Forearm  IV Started by:  Matilde Haymaker, RN  Chest Size (in):  44 Cup Size: n/a  Height: 6' (1.829 m)  Weight:  164 lb (74.39 kg)  BMI:  Body mass index is 22.24 kg/(m^2). Tech Comments:  No Insulin this am or CBG at home. CBG 90 at 1000    Nuclear Med Study 1 or 2 day study: 1 day  Stress Test Type:  Carlton Adam  Reading MD: n/a  Order Authorizing Provider:  Rogue Jury Arida,MD  Resting Radionuclide: Technetium 40m Sestamibi  Resting Radionuclide Dose: 11.0 mCi   Stress Radionuclide:  Technetium 67m Sestamibi  Stress Radionuclide Dose: 33.0 mCi           Stress Protocol Rest HR: 73 Stress HR: 90  Rest BP: 148/73 Stress BP: 150/68  Exercise Time (min): n/a METS: n/a           Dose of Adenosine (mg):  n/a Dose of Lexiscan: 0.4 mg  Dose of Atropine (mg): n/a Dose of Dobutamine: n/a mcg/kg/min (at max HR)  Stress Test Technologist: Glade Lloyd, BS-ES  Nuclear Technologist:  Vedia Pereyra, CNMT     Rest Procedure:  Myocardial perfusion imaging was performed at rest 45 minutes following the intravenous administration of Technetium 70m Sestamibi. Rest ECG: NSR - Normal EKG  Stress Procedure:  The patient received IV Lexiscan 0.4 mg over 15-seconds.  Technetium 63m Sestamibi injected at 30-seconds.  Quantitative spect images were obtained after a 45  minute delay. Stress ECG: No significant change from baseline ECG  QPS Raw Data Images:  Normal; no motion artifact; normal heart/lung ratio. Stress Images:  Normal homogeneous uptake in all areas of the myocardium. Rest Images:  Normal homogeneous uptake in all areas of the myocardium. Subtraction (SDS):  No evidence of ischemia. Transient Ischemic Dilatation (Normal <1.22):  1.04 Lung/Heart Ratio (Normal <0.45):  0.27  Quantitative Gated Spect Images QGS EDV:  99 ml QGS ESV:  43 ml  Impression Exercise Capacity:  Lexiscan with no exercise. BP Response:  Normal blood pressure response. Clinical Symptoms:  Shortness of breath ECG Impression:  No significant ST segment change suggestive of ischemia. Comparison with Prior Nuclear Study: No previous nuclear study performed  Overall Impression:  Normal stress nuclear study. This is a low risk scan. There is no scar or ischemia.  LV Ejection Fraction: 57%.  LV Kavanagh Motion:  Normal [place] Motion.  Dola Argyle, MD

## 2013-07-26 ENCOUNTER — Ambulatory Visit (INDEPENDENT_AMBULATORY_CARE_PROVIDER_SITE_OTHER): Payer: Medicare Other | Admitting: Endocrinology

## 2013-07-26 ENCOUNTER — Encounter: Payer: Self-pay | Admitting: Endocrinology

## 2013-07-26 VITALS — BP 122/56 | HR 83 | Temp 98.3°F | Ht 72.0 in | Wt 161.0 lb

## 2013-07-26 DIAGNOSIS — E1065 Type 1 diabetes mellitus with hyperglycemia: Principal | ICD-10-CM

## 2013-07-26 DIAGNOSIS — E1029 Type 1 diabetes mellitus with other diabetic kidney complication: Secondary | ICD-10-CM

## 2013-07-26 DIAGNOSIS — B1710 Acute hepatitis C without hepatic coma: Secondary | ICD-10-CM

## 2013-07-26 LAB — BASIC METABOLIC PANEL
BUN: 12 mg/dL (ref 6–23)
CALCIUM: 9.4 mg/dL (ref 8.4–10.5)
CO2: 30 meq/L (ref 19–32)
CREATININE: 1 mg/dL (ref 0.4–1.5)
Chloride: 99 mEq/L (ref 96–112)
GFR: 84.84 mL/min (ref >60.00)
Glucose, Bld: 449 mg/dL — ABNORMAL HIGH (ref 70–99)
Potassium: 4.4 mEq/L (ref 3.5–5.1)
SODIUM: 135 meq/L (ref 135–145)

## 2013-07-26 LAB — HEPATIC FUNCTION PANEL
ALBUMIN: 3.5 g/dL (ref 3.5–5.2)
ALT: 22 U/L (ref 0–53)
AST: 19 U/L (ref 0–37)
Alkaline Phosphatase: 71 U/L (ref 39–117)
Bilirubin, Direct: 0 mg/dL (ref 0.0–0.3)
TOTAL PROTEIN: 7.1 g/dL (ref 6.0–8.3)
Total Bilirubin: 0.6 mg/dL (ref 0.3–1.2)

## 2013-07-26 LAB — HEMOGLOBIN A1C: HEMOGLOBIN A1C: 9.3 % — AB (ref 4.6–6.5)

## 2013-07-26 NOTE — Patient Instructions (Addendum)
check your blood sugar 4 times a day--before the 3 meals, and at bedtime.  also check if you have symptoms of your blood sugar being too high or too low.  please keep a record of the readings and bring it to your next appointment here.  please call us sooner if you are having low blood sugar episodes. Please schedule a follow-up appointment in 3 months. blood tests are being requested for you today, at the [place] office.  We'll contact you with results. Take humalog, just 2 units any time the blood sugar is over 250.  Keep the toenail area covered with antibiotic ointment and a bandaid, until it heals.

## 2013-07-26 NOTE — Progress Notes (Signed)
Subjective:    Patient ID: [name], male    DOB: May 23, 1955, 58 y.o.   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  He does not check cbg's, as he says he cannot buy strips.  He denies hypoglycemia.   Past Medical History  Diagnosis Date  . Glaucoma   . HEPATITIS C     chronic  . DIABETES MELLITUS, TYPE I   . DRUG ABUSE     pt should have NO controlled substances rx'ed  . Vitiligo   . 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 DEPRESSION   . Hypertension 02/18/2011    Past Surgical History  Procedure Laterality Date  . Eye surgery      retinal surgery x 2, right eye    History   Social History  . Marital Status: Single    Spouse Name: N/A    Number of Children: N/A  . Years of Education: N/A   Occupational History  .      disabled   Social History Main Topics  . Smoking status: Current Every Day Smoker -- 2.00 packs/day    Types: Cigarettes  . Smokeless tobacco: Not on file     Comment: Single, lives in boarding house. disable, prev mental health drug counselor.   . Alcohol Use: No  . Drug Use: Yes    Special: "Crack" cocaine     Comment: s/p rehab x 6  . Sexual Activity: Not on file   Other Topics Concern  . Not on file   Social History Narrative   Lives in boarding house   Ongoing occ drug use-crack    Current Outpatient Prescriptions on File Prior to Visit  Medication Sig Dispense Refill  . aspirin EC 81 MG tablet Take 1 tablet (81 mg total) by mouth daily.  30 tablet  6  . B-D ULTRAFINE III SHORT PEN 31G X 8 MM MISC USE AS DIRECTED  100 each  0  .  Insulin Detemir (LEVEMIR) 100 UNIT/ML Pen Inject 60 Units into the skin every morning. And pen needles 2/day      . insulin lispro (HUMALOG) 100 UNIT/ML injection 2 units for any blood sugar over 250  10 mL  0  . ONE TOUCH ULTRA TEST test strip TEST BLOOD SUGAR FOUR TIMES DAILY  150 each  2   No current facility-administered medications on file prior to visit.    Allergies  Allergen Reactions  . Codeine Itching    Family History  Problem Relation Age of Onset  . Arthritis Mother   . Heart disease Mother     CAD  . Diabetes Father   . Kidney disease Father     BP 122/56  Pulse 83  Temp(Src) 98.3 F (36.8 C) (Oral)  Ht 6' (1.829 m)  Wt 161 lb (73.029 kg)  BMI 21.83 kg/m2  SpO2 93%  Review of Systems Pt has pain and bleeding at the 2nd toenail, since it fell off after a minor injury.       Objective:   Physical Exam VITAL SIGNS:  See  vs page GENERAL: no distress  Lab Results  Component Value Date   HGBA1C 9.3* 07/26/2013      Assessment & Plan:  DM: This insulin regimen was chosen from multiple options, for its simplicity.  The benefits of glycemic control must be weighed against the risks of hypoglycemia.  He needs increased rx. Toenail avulsion, new.

## 2013-10-18 ENCOUNTER — Ambulatory Visit: Payer: Medicare Other | Admitting: Internal Medicine

## 2013-10-20 ENCOUNTER — Telehealth: Payer: Self-pay | Admitting: *Deleted

## 2013-10-20 DIAGNOSIS — E109 Type 1 diabetes mellitus without complications: Secondary | ICD-10-CM

## 2013-10-20 NOTE — Telephone Encounter (Signed)
Patient has an appointment Lipid panel ordered Diabetic bundle

## 2013-10-22 ENCOUNTER — Telehealth: Payer: Self-pay

## 2013-10-22 NOTE — Telephone Encounter (Signed)
Error

## 2013-10-26 ENCOUNTER — Ambulatory Visit: Payer: Medicare Other | Admitting: Endocrinology

## 2013-11-02 ENCOUNTER — Ambulatory Visit (INDEPENDENT_AMBULATORY_CARE_PROVIDER_SITE_OTHER): Payer: Medicare Other | Admitting: Endocrinology

## 2013-11-02 ENCOUNTER — Encounter: Payer: Self-pay | Admitting: Endocrinology

## 2013-11-02 VITALS — BP 138/70 | HR 88 | Temp 98.6°F | Ht 72.0 in | Wt 168.0 lb

## 2013-11-02 DIAGNOSIS — E1065 Type 1 diabetes mellitus with hyperglycemia: Principal | ICD-10-CM

## 2013-11-02 DIAGNOSIS — E1029 Type 1 diabetes mellitus with other diabetic kidney complication: Secondary | ICD-10-CM

## 2013-11-02 DIAGNOSIS — E109 Type 1 diabetes mellitus without complications: Secondary | ICD-10-CM

## 2013-11-02 LAB — LIPID PANEL
Cholesterol: 184 mg/dL (ref 0–200)
HDL: 73.4 mg/dL (ref >39.00)
LDL Cholesterol: 83 mg/dL (ref 0–99)
NonHDL: 110.6
Total CHOL/HDL Ratio: 3
Triglycerides: 138 mg/dL (ref 0.0–149.0)
VLDL: 27.6 mg/dL (ref 0.0–40.0)

## 2013-11-02 LAB — HEMOGLOBIN A1C: Hgb A1c MFr Bld: 8.9 % — ABNORMAL HIGH (ref 4.6–6.5)

## 2013-11-02 NOTE — Progress Notes (Signed)
Subjective:    Patient ID: [name], male    DOB: [birth year], 58 y.o.   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  no cbg record, but states cbg's vary from 54-320.  It is in general higher as the day goes on.  Foot pain persists.   Past Medical History  Diagnosis Date  . Glaucoma   . HEPATITIS C     chronic  . DIABETES MELLITUS, TYPE I   . DRUG ABUSE     pt should have NO controlled substances rx'ed  . Vitiligo   . 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 DEPRESSION   . Hypertension 02/18/2011    Past Surgical History  Procedure Laterality Date  . Eye surgery      retinal surgery x 2, right eye    History   Social History  . Marital Status: Single    Spouse Name: N/A    Number of Children: N/A  . Years of Education: N/A   Occupational History  .      disabled   Social History Main Topics  . Smoking status: Current Every Day Smoker -- 2.00 packs/day    Types: Cigarettes  . Smokeless tobacco: Not on file     Comment: Single, lives in boarding house. disable, prev mental health drug counselor.   . Alcohol Use: No  . Drug Use: Yes    Special: "Crack" cocaine     Comment: s/p rehab x 6  . Sexual Activity: Not on file   Other Topics Concern  . Not on file   Social History Narrative   Lives in boarding house   Ongoing occ drug use-crack    Current Outpatient Prescriptions on File Prior to Visit  Medication Sig Dispense Refill  . aspirin EC 81 MG tablet Take 1 tablet (81 mg total) by mouth daily.  30 tablet  6  . B-D ULTRAFINE III SHORT PEN  31G X 8 MM MISC USE AS DIRECTED  100 each  0  . Insulin Detemir (LEVEMIR) 100 UNIT/ML Pen Inject 60 Units into the skin every morning. And pen needles 2/day      . insulin lispro (HUMALOG) 100 UNIT/ML injection 2 units for any blood sugar over 250  10 mL  0  . ONE TOUCH ULTRA TEST test strip TEST BLOOD SUGAR FOUR TIMES DAILY  150 each  2   No current facility-administered medications on file prior to visit.    Allergies  Allergen Reactions  . Codeine Itching    Family History  Problem Relation Age of Onset  . Arthritis Mother   . Heart disease Mother     CAD  . Diabetes Father   . Kidney disease Father     BP 138/70  Pulse 88  Temp(Src) 98.6 F (37 C) (Oral)  Ht 6' (1.829 m)  Wt 168 lb (76.204 kg)  BMI 22.78 kg/m2  SpO2 96%    Review of Systems Denies LOC.  He has lost a few lbs.      Objective:   Physical Exam Pulses:  dorsalis pedis intact bilat.  Feet: no deformity. no ulcer on the feet. feet are of normal color and temp. no edema. There is bilateral onychomycosis. The left 2nd toenail is traumatically absent.  Neuro: sensation is intact to touch on the feet, but severely decreased from normal.     Lab Results  Component Value Date   HGBA1C 8.9* 11/02/2013      Assessment & Plan:  DM: moderate exacerbation.  The pattern of his cbg's indicates he may do better with NPH. Chronic foot and leg pain: probably neuropathic.  This compromises the rx of DM. This impairs the ability to achieve glycemic control.  I'll work around this as best I can.   Noncompliance with cbg recording: I'll work around this as best I can     Patient is advised the following: Patient Instructions  check your blood sugar 4 times a day--before the 3 meals, and at bedtime.  also check if you have symptoms of your blood sugar being too high or too low.  please keep a record of the readings and bring it to your next appointment here.  please call us sooner if you are having low blood sugar  episodes. Please schedule a follow-up appointment in 3 months.   Take humalog, just 2 units any time the blood sugar is over 250.   blood tests are being requested for you today.  We'll contact you with results.  Based on the results, you may need to change to NPH insulin.

## 2013-11-02 NOTE — Patient Instructions (Addendum)
check your blood sugar 4 times a day--before the 3 meals, and at bedtime.  also check if you have symptoms of your blood sugar being too high or too low.  please keep a record of the readings and bring it to your next appointment here.  please call us sooner if you are having low blood sugar episodes. Please schedule a follow-up appointment in 3 months.   Take humalog, just 2 units any time the blood sugar is over 250.   blood tests are being requested for you today.  We'll contact you with results.  Based on the results, you may need to change to NPH insulin.

## 2013-11-03 LAB — MICROALBUMIN / CREATININE URINE RATIO
Creatinine,U: 176.8 mg/dL
Microalb Creat Ratio: 72.5 mg/g — ABNORMAL HIGH (ref 0.0–30.0)
Microalb, Ur: 128.2 mg/dL — ABNORMAL HIGH (ref 0.0–1.9)

## 2013-11-04 ENCOUNTER — Other Ambulatory Visit (INDEPENDENT_AMBULATORY_CARE_PROVIDER_SITE_OTHER): Payer: Medicare Other

## 2013-11-04 ENCOUNTER — Ambulatory Visit (INDEPENDENT_AMBULATORY_CARE_PROVIDER_SITE_OTHER): Payer: Medicare Other | Admitting: Internal Medicine

## 2013-11-04 ENCOUNTER — Encounter: Payer: Self-pay | Admitting: Internal Medicine

## 2013-11-04 VITALS — BP 140/56 | HR 90 | Temp 98.0°F | Ht 72.0 in | Wt 164.8 lb

## 2013-11-04 DIAGNOSIS — I152 Hypertension secondary to endocrine disorders: Secondary | ICD-10-CM

## 2013-11-04 DIAGNOSIS — I158 Other secondary hypertension: Secondary | ICD-10-CM

## 2013-11-04 DIAGNOSIS — E349 Endocrine disorder, unspecified: Secondary | ICD-10-CM

## 2013-11-04 DIAGNOSIS — Z1211 Encounter for screening for malignant neoplasm of colon: Secondary | ICD-10-CM

## 2013-11-04 DIAGNOSIS — E1065 Type 1 diabetes mellitus with hyperglycemia: Principal | ICD-10-CM

## 2013-11-04 DIAGNOSIS — E1029 Type 1 diabetes mellitus with other diabetic kidney complication: Secondary | ICD-10-CM

## 2013-11-04 DIAGNOSIS — B1710 Acute hepatitis C without hepatic coma: Secondary | ICD-10-CM

## 2013-11-04 LAB — HEPATIC FUNCTION PANEL
ALK PHOS: 96 U/L (ref 39–117)
ALT: 36 U/L (ref 0–53)
AST: 30 U/L (ref 0–37)
Albumin: 3.6 g/dL (ref 3.5–5.2)
BILIRUBIN DIRECT: 0.1 mg/dL (ref 0.0–0.3)
BILIRUBIN TOTAL: 0.7 mg/dL (ref 0.2–1.2)
TOTAL PROTEIN: 7.3 g/dL (ref 6.0–8.3)

## 2013-11-04 LAB — BASIC METABOLIC PANEL
BUN: 15 mg/dL (ref 6–23)
CALCIUM: 9.6 mg/dL (ref 8.4–10.5)
CO2: 30 meq/L (ref 19–32)
Chloride: 104 mEq/L (ref 96–112)
Creatinine, Ser: 0.8 mg/dL (ref 0.4–1.5)
GFR: 104.36 mL/min (ref >60.00)
Glucose, Bld: 235 mg/dL — ABNORMAL HIGH (ref 70–99)
Potassium: 4.2 mEq/L (ref 3.5–5.1)
SODIUM: 139 meq/L (ref 135–145)

## 2013-11-04 NOTE — Progress Notes (Signed)
Pre visit review using our clinic review tool, if applicable. No additional management support is needed unless otherwise documented below in the visit note. 

## 2013-11-04 NOTE — Assessment & Plan Note (Signed)
started ARB 02/2011 after reviewing indications for "renal protection from DM" The current medical regimen is effective;  continue present plan and medications.  BP Readings from Last 3 Encounters:  11/04/13 140/56  11/02/13 138/70  07/26/13 122/56
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
## 2013-11-04 NOTE — Assessment & Plan Note (Signed)
Prior evaluation 2007 by hepatologist for same - never underwent treatment to to ongoing drug abuse at the time Declined IFN due to depression hx and prior failure UDS, ?oral tx now Refer to specialist clinic to consider same Recheck LFTs now to monitor same

## 2013-11-04 NOTE — Assessment & Plan Note (Signed)
Therapy historically limited by compliance 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 Follows with endo ([facility]), continue same recommendations Check microalb, lipids - advise ASA  Lab Results  Component Value Date   HGBA1C 8.9* 11/02/2013

## 2013-11-04 NOTE — Patient Instructions (Signed)
It was good to see you today.  We have reviewed your prior records including labs and tests today  Test(s) ordered today. Your results will be released to [facility] (or called to you) after review, usually within 72hours after test completion. If any changes need to be made, you will be notified at that same time.  Continue to think about giving up cigarettes! Use nicotine gum, nicotine patches or electronic cigarettes. If you're interested in medication to help you quit, please call  - let me know how I can help!  Medications reviewed and updated, no changes recommended at this time. Refill on medication(s) as discussed today.  we'll make referral to Hep C clinic to consider oral medication treatment. Our office will contact you regarding appointment(s) once made.  Please schedule followup in 6 months, call sooner if problems.

## 2013-11-05 ENCOUNTER — Telehealth: Payer: Self-pay

## 2013-11-05 ENCOUNTER — Telehealth: Payer: Self-pay | Admitting: Internal Medicine

## 2013-11-05 NOTE — Telephone Encounter (Signed)
error 

## 2013-11-05 NOTE — Telephone Encounter (Signed)
Pt would like to know if there is a referral in for a colonoscopy.   [facility]... Pt is wondering if he qualifies for the clinic.   I was able to answer his above questions that both referrals were in.

## 2013-11-15 ENCOUNTER — Other Ambulatory Visit: Payer: Medicare Other

## 2013-11-16 ENCOUNTER — Encounter (HOSPITAL_COMMUNITY): Payer: Self-pay | Admitting: Emergency Medicine

## 2013-11-16 ENCOUNTER — Emergency Department (HOSPITAL_COMMUNITY)
Admission: EM | Admit: 2013-11-16 | Discharge: 2013-11-16 | Disposition: A | Discharge status: Home / Self Care | Payer: Medicare Other | Attending: Emergency Medicine | Admitting: Emergency Medicine

## 2013-11-16 DIAGNOSIS — Z794 Long term (current) use of insulin: Secondary | ICD-10-CM | POA: Insufficient documentation

## 2013-11-16 DIAGNOSIS — Z7982 Long term (current) use of aspirin: Secondary | ICD-10-CM | POA: Insufficient documentation

## 2013-11-16 DIAGNOSIS — B369 Superficial mycosis, unspecified: Secondary | ICD-10-CM | POA: Insufficient documentation

## 2013-11-16 DIAGNOSIS — E1039 Type 1 diabetes mellitus with other diabetic ophthalmic complication: Secondary | ICD-10-CM | POA: Diagnosis not present

## 2013-11-16 DIAGNOSIS — E11359 Type 2 diabetes mellitus with proliferative diabetic retinopathy without macular edema: Secondary | ICD-10-CM | POA: Insufficient documentation

## 2013-11-16 DIAGNOSIS — R21 Rash and other nonspecific skin eruption: Secondary | ICD-10-CM | POA: Diagnosis present

## 2013-11-16 DIAGNOSIS — Z8659 Personal history of other mental and behavioral disorders: Secondary | ICD-10-CM | POA: Insufficient documentation

## 2013-11-16 DIAGNOSIS — R6883 Chills (without fever): Secondary | ICD-10-CM | POA: Insufficient documentation

## 2013-11-16 DIAGNOSIS — R739 Hyperglycemia, unspecified: Secondary | ICD-10-CM

## 2013-11-16 DIAGNOSIS — F17200 Nicotine dependence, unspecified, uncomplicated: Secondary | ICD-10-CM | POA: Insufficient documentation

## 2013-11-16 DIAGNOSIS — I10 Essential (primary) hypertension: Secondary | ICD-10-CM | POA: Diagnosis not present

## 2013-11-16 DIAGNOSIS — L80 Vitiligo: Secondary | ICD-10-CM | POA: Diagnosis not present

## 2013-11-16 DIAGNOSIS — B354 Tinea corporis: Secondary | ICD-10-CM | POA: Diagnosis not present

## 2013-11-16 DIAGNOSIS — Z79899 Other long term (current) drug therapy: Secondary | ICD-10-CM | POA: Insufficient documentation

## 2013-11-16 LAB — URINALYSIS, ROUTINE W REFLEX MICROSCOPIC
Bilirubin Urine: NEGATIVE
Hgb urine dipstick: NEGATIVE
Ketones, ur: NEGATIVE mg/dL
NITRITE: NEGATIVE
Protein, ur: NEGATIVE mg/dL
Specific Gravity, Urine: 1.024 (ref 1.005–1.030)
Urobilinogen, UA: 4 mg/dL — ABNORMAL HIGH (ref 0.0–1.0)
pH: 6.5 (ref 5.0–8.0)

## 2013-11-16 LAB — CBC WITH DIFFERENTIAL/PLATELET
BASOS ABS: 0.1 10*3/uL (ref 0.0–0.1)
Basophils Relative: 0 % (ref 0–1)
EOS ABS: 0.9 10*3/uL — AB (ref 0.0–0.7)
Eosinophils Relative: 8 % — ABNORMAL HIGH (ref 0–5)
HCT: 39.5 % (ref 39.0–52.0)
Hemoglobin: 13.9 g/dL (ref 13.0–17.0)
Lymphocytes Relative: 5 % — ABNORMAL LOW (ref 12–46)
Lymphs Abs: 0.6 10*3/uL — ABNORMAL LOW (ref 0.7–4.0)
MCH: 31.2 pg (ref 26.0–34.0)
MCHC: 35.2 g/dL (ref 30.0–36.0)
MCV: 88.6 fL (ref 78.0–100.0)
Monocytes Absolute: 1.3 10*3/uL — ABNORMAL HIGH (ref 0.1–1.0)
Monocytes Relative: 11 % (ref 3–12)
Neutro Abs: 8.8 10*3/uL — ABNORMAL HIGH (ref 1.7–7.7)
Neutrophils Relative %: 76 % (ref 43–77)
PLATELETS: 217 10*3/uL (ref 150–400)
RBC: 4.46 MIL/uL (ref 4.22–5.81)
RDW: 13.1 % (ref 11.5–15.5)
WBC: 11.6 10*3/uL — ABNORMAL HIGH (ref 4.0–10.5)

## 2013-11-16 LAB — CBG MONITORING, ED: Glucose-Capillary: 402 mg/dL — ABNORMAL HIGH (ref 70–99)

## 2013-11-16 LAB — BASIC METABOLIC PANEL
Anion gap: 13 (ref 5–15)
BUN: 13 mg/dL (ref 6–23)
CALCIUM: 9.1 mg/dL (ref 8.4–10.5)
CO2: 26 mEq/L (ref 19–32)
Chloride: 92 mEq/L — ABNORMAL LOW (ref 96–112)
Creatinine, Ser: 0.72 mg/dL (ref 0.50–1.35)
GLUCOSE: 413 mg/dL — AB (ref 70–99)
Potassium: 3.8 mEq/L (ref 3.7–5.3)
Sodium: 131 mEq/L — ABNORMAL LOW (ref 137–147)

## 2013-11-16 LAB — URINE MICROSCOPIC-ADD ON

## 2013-11-16 MED ORDER — SODIUM CHLORIDE 0.9 % IV BOLUS (SEPSIS)
1000.0000 mL | Freq: Once | INTRAVENOUS | Status: AC
  Administered 2013-11-16: 1000 mL via INTRAVENOUS

## 2013-11-16 MED ORDER — FLUCONAZOLE 200 MG PO TABS: 200.0000 mg | ORAL_TABLET | Freq: Every day | ORAL | Status: AC

## 2013-11-16 MED ORDER — NYSTATIN-TRIAMCINOLONE 100000-0.1 UNIT/GM-% EX CREA
TOPICAL_CREAM | Freq: Two times a day (BID) | CUTANEOUS | Status: DC
  Filled 2013-11-16: qty 15

## 2013-11-16 MED ORDER — CLOTRIMAZOLE 1 % EX CREA: TOPICAL_CREAM | CUTANEOUS | Status: DC

## 2013-11-16 MED ORDER — OXYCODONE-ACETAMINOPHEN 5-325 MG PO TABS
1.0000 | ORAL_TABLET | Freq: Once | ORAL | Status: AC
Start: 2013-11-16 — End: 2013-11-16
  Administered 2013-11-16: 1 via ORAL
  Filled 2013-11-16: qty 1

## 2013-11-16 MED ORDER — DIPHENHYDRAMINE HCL 25 MG PO CAPS
25.0000 mg | ORAL_CAPSULE | Freq: Once | ORAL | Status: AC
  Administered 2013-11-16: 25 mg via ORAL
  Filled 2013-11-16: qty 1

## 2013-11-16 MED ORDER — INSULIN ASPART 100 UNIT/ML ~~LOC~~ SOLN
6.0000 [IU] | Freq: Once | SUBCUTANEOUS | Status: AC
  Administered 2013-11-16: 6 [IU] via SUBCUTANEOUS
  Filled 2013-11-16: qty 1

## 2013-11-16 NOTE — ED Notes (Signed)
Pt in bathroom, attempting to provide urine sample.

## 2013-11-16 NOTE — ED Provider Notes (Addendum)
CSN: [id number]     Arrival date & time 11/16/13  1317 History   First MD Initiated Contact with Patient 11/16/13 1400     Chief Complaint  Patient presents with  . Rash     (Consider location/radiation/quality/duration/timing/severity/associated sxs/prior Treatment) HPI  This is a 58 year old male with a history of diabetes he presents with rash.  Patient reports a 3 day history of itching rash in his bilateral axilla and groin.  He reports chills without fever.  He states that it is itchy and painful.  He is fearful that this might be MRSA.  He reports a history of the same.  He denies any new exposures, new detergents.  Patient also reports that he feels like his blood sugar is high because he said thirsty.  He's not taking his insulin today.  He denies any chest pain, shortness of breath, abdominal pain, urinary symptoms.  Past Medical History  Diagnosis Date  . Glaucoma   . HEPATITIS C     chronic  . DIABETES MELLITUS, TYPE I   . DRUG ABUSE     pt should have NO controlled substances rx'ed  . Vitiligo   . 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 DEPRESSION   . Hypertension 02/18/2011   Past Surgical History  Procedure Laterality Date  . Eye surgery      retinal surgery x 2, right eye   Family History  Problem Relation Age of Onset  . Arthritis Mother   . Heart disease Mother     CAD  . Diabetes Father   . Kidney disease Father    History  Substance Use Topics  . Smoking status: Current Every Day Smoker -- 2.00 packs/day    Types: Cigarettes  . Smokeless tobacco: Not on file     Comment: Single, lives in boarding house. disable, prev mental health drug counselor.   . Alcohol Use: No    Review of Systems  Constitutional: Positive for chills. Negative for fever.  Respiratory: Negative.  Negative for chest tightness and shortness of breath.   Cardiovascular: Negative.  Negative for chest pain.  Gastrointestinal: Negative.  Negative for abdominal pain.   Genitourinary: Negative.  Negative for dysuria.  Skin: Positive for rash.  All other systems reviewed and are negative.     Allergies  Codeine  Home Medications   Prior to Admission medications   Medication Sig Start Date End Date Taking? Authorizing Provider  aspirin EC 81 MG tablet Take 1 tablet (81 mg total) by mouth daily. 07/06/13  Yes Wellington Hampshire, MD  B-D ULTRAFINE III SHORT PEN 31G X 8 MM MISC USE AS DIRECTED 02/06/12  Yes Renato Shin, MD  Insulin Detemir (LEVEMIR) 100 UNIT/ML Pen Inject 60 Units into the skin every morning. And pen needles 2/day 10/02/12  Yes Renato Shin, MD  insulin lispro (HUMALOG) 100 UNIT/ML injection 2 units for any blood sugar over 250 12/21/12  Yes Renato Shin, MD  ONE TOUCH ULTRA TEST test strip TEST BLOOD SUGAR FOUR TIMES DAILY 11/20/12  Yes Renato Shin, MD   BP 138/69  Pulse 86  Temp(Src) 98.5 F (36.9 C) (Oral)  Resp 20  SpO2 98% Physical Exam  Nursing note and vitals reviewed. Constitutional: He is oriented to person, place, and time. He appears well-developed and well-nourished. No distress.  HENT:  Head: Normocephalic and atraumatic.  Mucous membranes  Cardiovascular: Normal rate, regular rhythm and normal heart sounds.   No murmur heard. Pulmonary/Chest: Effort normal and breath sounds normal.  No respiratory distress. He has no wheezes.  Lymphadenopathy:    He has no cervical adenopathy.  Neurological: He is alert and oriented to person, place, and time.  Skin: Skin is warm and dry.  Diffuse vitiligo, but this examination of the bilateral axilla reveal blanching erythematous patches which are slightly raised, examination of the groin reveals diffuse edema with raised blanching patches as well as a patch the right thigh, No crepitus of the groin or perineum  Psychiatric: He has a normal mood and affect.    ED Course  Procedures (including critical care time) Labs Review Labs Reviewed  CBG MONITORING, ED - Abnormal; Notable for  the following:    Glucose-Capillary 402 (*)    All other components within normal limits  BASIC METABOLIC PANEL  CBC WITH DIFFERENTIAL  URINALYSIS, ROUTINE W REFLEX MICROSCOPIC    Imaging Review No results found.   EKG Interpretation None      MDM   Final diagnoses:  Tinea corporis  Hyperglycemia    Patient presents with rash in the bilateral groin and axilla.  Reports chills but is afebrile.  Also reports that he feels his blood sugar is high.  CBGs 402.  Rash appears fungal and likely tinea.  No overlying excoriations or spreading erythema.  Low suspicion for underlying bacterial infection at this time.  Patient was given Benadryl and Percocet for his pain.  Basic lab work obtained given his high blood sugar.  Patient is also given a normal saline bolus and 6 units of insulin based on his home sliding scale.  This is pending at time of signout.  If all is reassuring, patient can be discharged home.  He will follow up with his primary care physician if rash does not improve.  He can be discharged home on clomitrimazole and oral fluconazole.     Merryl Hacker, MD 11/16/13 [id number]  Merryl Hacker, MD 11/16/13 [phone]

## 2013-11-16 NOTE — ED Notes (Signed)
Notified pt we needed a Urine Sample. Pt stated he had already went and couldn't void. Pt asked why Sample was needed, Pt stated blood work had been drawn and that was just at his PCP.

## 2013-11-16 NOTE — ED Notes (Addendum)
Pt states he has rash b/w crouch and under arm. States he thinks he has MRSA infection due to previous infection when pt had surgery. Pt also other complaints, states he is unable to sleep, hands are swelling and tingling, itching, and UTI.

## 2013-11-16 NOTE — Discharge Instructions (Signed)
Your rash is from a yeast infection. Use the cream am and pm. Take diflucan every day until gone. Hyperglycemia Hyperglycemia occurs when the glucose (sugar) in your blood is too high. Hyperglycemia can happen for many reasons, but it most often happens to people who do not know they have diabetes or are not managing their diabetes properly.  CAUSES  Whether you have diabetes or not, there are other causes of hyperglycemia. Hyperglycemia can occur when you have diabetes, but it can also occur in other situations that you might not be as aware of, such as: Diabetes  If you have diabetes and are having problems controlling your blood glucose, hyperglycemia could occur because of some of the following reasons:  Not following your meal plan.  Not taking your diabetes medications or not taking it properly.  Exercising less or doing less activity than you normally do.  Being sick. Pre-diabetes  This cannot be ignored. Before people develop Type 2 diabetes, they almost always have "pre-diabetes." This is when your blood glucose levels are higher than normal, but not yet high enough to be diagnosed as diabetes. Research has shown that some long-term damage to the body, especially the heart and circulatory system, may already be occurring during pre-diabetes. If you take action to manage your blood glucose when you have pre-diabetes, you may delay or prevent Type 2 diabetes from developing. Stress  If you have diabetes, you may be "diet" controlled or on oral medications or insulin to control your diabetes. However, you may find that your blood glucose is higher than usual in the hospital whether you have diabetes or not. This is often referred to as "stress hyperglycemia." Stress can elevate your blood glucose. This happens because of hormones put out by the body during times of stress. If stress has been the cause of your high blood glucose, it can be followed regularly by your caregiver. That way  he/she can make sure your hyperglycemia does not continue to get worse or progress to diabetes. Steroids  Steroids are medications that act on the infection fighting system (immune system) to block inflammation or infection. One side effect can be a rise in blood glucose. Most people can produce enough extra insulin to allow for this rise, but for those who cannot, steroids make blood glucose levels go even higher. It is not unusual for steroid treatments to "uncover" diabetes that is developing. It is not always possible to determine if the hyperglycemia will go away after the steroids are stopped. A special blood test called an A1c is sometimes done to determine if your blood glucose was elevated before the steroids were started. SYMPTOMS  Thirsty.  Frequent urination.  Dry mouth.  Blurred vision.  Tired or fatigue.  Weakness.  Sleepy.  Tingling in feet or leg. DIAGNOSIS  Diagnosis is made by monitoring blood glucose in one or all of the following ways:  A1c test. This is a chemical found in your blood.  Fingerstick blood glucose monitoring.  Laboratory results. TREATMENT  First, knowing the cause of the hyperglycemia is important before the hyperglycemia can be treated. Treatment may include, but is not be limited to:  Education.  Change or adjustment in medications.  Change or adjustment in meal plan.  Treatment for an illness, infection, etc.  More frequent blood glucose monitoring.  Change in exercise plan.  Decreasing or stopping steroids.  Lifestyle changes. HOME CARE INSTRUCTIONS   Test your blood glucose as directed.  Exercise regularly. Your caregiver will give  you instructions about exercise. Pre-diabetes or diabetes which comes on with stress is helped by exercising.  Eat wholesome, balanced meals. Eat often and at regular, fixed times. Your caregiver or nutritionist will give you a meal plan to guide your sugar intake.  Being at an ideal weight is  important. If needed, losing as little as 10 to 15 pounds may help improve blood glucose levels. SEEK MEDICAL CARE IF:   You have questions about medicine, activity, or diet.  You continue to have symptoms (problems such as increased thirst, urination, or weight gain). SEEK IMMEDIATE MEDICAL CARE IF:   You are vomiting or have diarrhea.  Your breath smells fruity.  You are breathing faster or slower.  You are very sleepy or incoherent.  You have numbness, tingling, or pain in your feet or hands.  You have chest pain.  Your symptoms get worse even though you have been following your caregiver's orders.  If you have any other questions or concerns. Document Released: 09/25/2000 Document Revised: 06/24/2011 Document Reviewed: 07/29/2011 [facility] Patient Information 2015 [place], [place]. This information is not intended to replace advice given to you by your health care provider. Make sure you discuss any questions you have with your health care provider.

## 2013-11-17 LAB — CBG MONITORING, ED: Glucose-Capillary: 361 mg/dL — ABNORMAL HIGH (ref 70–99)

## 2013-12-08 ENCOUNTER — Encounter: Payer: Self-pay | Admitting: Internal Medicine

## 2013-12-08 ENCOUNTER — Other Ambulatory Visit (INDEPENDENT_AMBULATORY_CARE_PROVIDER_SITE_OTHER): Payer: Medicare Other

## 2013-12-08 ENCOUNTER — Ambulatory Visit (INDEPENDENT_AMBULATORY_CARE_PROVIDER_SITE_OTHER): Payer: Medicare Other | Admitting: Internal Medicine

## 2013-12-08 VITALS — BP 140/70 | HR 76 | Temp 98.2°F | Ht 72.0 in | Wt 165.2 lb

## 2013-12-08 DIAGNOSIS — B1710 Acute hepatitis C without hepatic coma: Secondary | ICD-10-CM

## 2013-12-08 DIAGNOSIS — L301 Dyshidrosis [pompholyx]: Secondary | ICD-10-CM

## 2013-12-08 DIAGNOSIS — F1910 Other psychoactive substance abuse, uncomplicated: Secondary | ICD-10-CM

## 2013-12-08 DIAGNOSIS — Z23 Encounter for immunization: Secondary | ICD-10-CM

## 2013-12-08 DIAGNOSIS — E109 Type 1 diabetes mellitus without complications: Secondary | ICD-10-CM

## 2013-12-08 DIAGNOSIS — E1049 Type 1 diabetes mellitus with other diabetic neurological complication: Secondary | ICD-10-CM

## 2013-12-08 LAB — CBC WITH DIFFERENTIAL/PLATELET
Basophils Absolute: 0.1 10*3/uL (ref 0.0–0.1)
Basophils Relative: 1.3 % (ref 0.0–3.0)
Eosinophils Absolute: 0.4 10*3/uL (ref 0.0–0.7)
Eosinophils Relative: 3.9 % (ref 0.0–5.0)
HCT: 44.4 % (ref 39.0–52.0)
Hemoglobin: 14.8 g/dL (ref 13.0–17.0)
Lymphocytes Relative: 15.6 % (ref 12.0–46.0)
Lymphs Abs: 1.6 10*3/uL (ref 0.7–4.0)
MCHC: 33.3 g/dL (ref 30.0–36.0)
MCV: 92.2 fl (ref 78.0–100.0)
Monocytes Absolute: 0.6 10*3/uL (ref 0.1–1.0)
Monocytes Relative: 6 % (ref 3.0–12.0)
Neutro Abs: 7.4 10*3/uL (ref 1.4–7.7)
Neutrophils Relative %: 73.2 % (ref 43.0–77.0)
Platelets: 308 10*3/uL (ref 150.0–400.0)
RBC: 4.81 Mil/uL (ref 4.22–5.81)
RDW: 14 % (ref 11.5–15.5)
WBC: 10.1 10*3/uL (ref 4.0–10.5)

## 2013-12-08 LAB — HEPATITIS B SURFACE ANTIGEN: Hepatitis B Surface Ag: NEGATIVE

## 2013-12-08 LAB — HEPATIC FUNCTION PANEL
ALT: 40 U/L (ref 0–53)
AST: 46 U/L — ABNORMAL HIGH (ref 0–37)
Albumin: 3.4 g/dL — ABNORMAL LOW (ref 3.5–5.2)
Alkaline Phosphatase: 170 U/L — ABNORMAL HIGH (ref 39–117)
Bilirubin, Direct: 0.1 mg/dL (ref 0.0–0.3)
Total Bilirubin: 0.8 mg/dL (ref 0.2–1.2)
Total Protein: 7.4 g/dL (ref 6.0–8.3)

## 2013-12-08 LAB — HEPATITIS B CORE ANTIBODY, TOTAL: Hep B Core Total Ab: NONREACTIVE

## 2013-12-08 LAB — HIV ANTIBODY (ROUTINE TESTING W REFLEX): HIV 1&2 Ab, 4th Generation: NONREACTIVE

## 2013-12-08 LAB — HEPATITIS B SURFACE ANTIBODY,QUALITATIVE: Hep B S Ab: NEGATIVE

## 2013-12-08 MED ORDER — GABAPENTIN 100 MG PO CAPS: 100.0000 mg | ORAL_CAPSULE | Freq: Every day | ORAL | Status: DC

## 2013-12-08 MED ORDER — AMITRIPTYLINE HCL 10 MG PO TABS: 10.0000 mg | ORAL_TABLET | Freq: Every day | ORAL | Status: DC

## 2013-12-08 MED ORDER — BETAMETHASONE DIPROPIONATE 0.05 % EX CREA: TOPICAL_CREAM | Freq: Two times a day (BID) | CUTANEOUS | Status: DC

## 2013-12-08 NOTE — Assessment & Plan Note (Signed)
Ongoing MJ but denies IVDA or other narcotic use at this time Requests check of Hep B and HIV

## 2013-12-08 NOTE — Progress Notes (Signed)
   Subjective:    Patient ID: [name], male    DOB: 1955/08/19, 58 y.o.   MRN: [id number]  HPI  Patient here for follow up Reviewed chronic medical issues and interval medical events  Past Medical History  Diagnosis Date  . Glaucoma   . HEPATITIS C     chronic  . DIABETES MELLITUS, TYPE I   . DRUG ABUSE     pt should have NO controlled substances rx'ed  . Vitiligo   . 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 DEPRESSION   . Hypertension 02/18/2011    Review of Systems  Constitutional: Positive for fatigue. Negative for fever and unexpected weight change.  Respiratory: Negative for cough and shortness of breath.   Cardiovascular: Negative for chest pain and leg swelling.  Skin: Positive for rash. Negative for wound.       Peeling skin on B hands -palms>ext surface and lateral finegrs  Psychiatric/Behavioral: Negative for decreased concentration.       Objective:   Physical Exam  BP 140/70  Pulse 76  Temp(Src) 98.2 F (36.8 C) (Oral)  Ht 6' (1.829 m)  Wt 165 lb 4 oz (74.957 kg)  BMI 22.41 kg/m2  SpO2 97% Wt Readings from Last 3 Encounters:  12/08/13 165 lb 4 oz (74.957 kg)  11/04/13 164 lb 12 oz (74.73 kg)  11/02/13 168 lb (76.204 kg)   Constitutional: he is thin, appears well-developed and well-nourished. No distress.  Neck: Normal range of motion. Neck supple. No JVD present. No thyromegaly present.  Cardiovascular: Normal rate, regular rhythm and normal heart sounds.  No murmur heard. No BLE edema. Pulmonary/Chest: Effort normal and breath sounds normal. No respiratory distress. he has no wheezes.  Skin: vitiligo changes again noted - B palms and lateral fingers with superficial skin peeling - mild digit edema in all fingers Psychiatric: he has a mildly anxious and expansive mood and affect. His behavior is normal. Judgment and thought content normal.   Lab Results  Component Value Date   WBC 11.6* 11/16/2013   HGB 13.9 11/16/2013   HCT 39.5 11/16/2013   PLT 217 11/16/2013   GLUCOSE 413* 11/16/2013   CHOL 184 11/02/2013   TRIG 138.0 11/02/2013   HDL 73.40 11/02/2013   LDLCALC 83 11/02/2013   ALT 36 11/04/2013   AST 30 11/04/2013   NA 131* 11/16/2013   K 3.8 11/16/2013   CL 92* 11/16/2013   CREATININE 0.72 11/16/2013   BUN 13 11/16/2013   CO2 26 11/16/2013   TSH 0.71 02/18/2011   PSA 0.36 11/19/2006   HGBA1C 8.9* 11/02/2013   MICROALBUR 128.2* 11/02/2013    No results found.     Assessment & Plan:

## 2013-12-08 NOTE — Progress Notes (Signed)
Pre visit review using our clinic review tool, if applicable. No additional management support is needed unless otherwise documented below in the visit note. 

## 2013-12-08 NOTE — Patient Instructions (Signed)
It was good to see you today.  We have reviewed your prior records including labs and tests today  Test(s) ordered today. Your results will be released to [place] (or called to you) after review, usually within 72hours after test completion. If any changes need to be made, you will be notified at that same time.  Medications reviewed and updated - change Lotrisone to betamethasone cream for hand rash Start amitriptyline and gabapentin at bedtime for neuropathy - No other changes recommended at this time.  Your prescription(s) have been submitted to your pharmacy. Please take as directed and contact our office if you believe you are having problem(s) with the medication(s).  Please schedule followup in 3-4 months, call sooner if problems.

## 2013-12-08 NOTE — Assessment & Plan Note (Signed)
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

## 2013-12-08 NOTE — Assessment & Plan Note (Signed)
Prior evaluation 2007 by hepatologist for same - never underwent treatment to to ongoing drug abuse at the time Declined IFN due to depression hx and prior failure UDS, declined oral tx because of cost Recheck LFTs now to monitor same

## 2013-12-10 ENCOUNTER — Encounter: Payer: Self-pay | Admitting: Internal Medicine

## 2013-12-14 ENCOUNTER — Telehealth: Payer: Self-pay | Admitting: *Deleted

## 2013-12-14 NOTE — Telephone Encounter (Signed)
Left msg on triage requesting labs that was done bck on 12/08/13. Currently staying [address] pls call back [phone] rm [phone]. Called pt gave him md response concerning labs...[name]

## 2013-12-16 ENCOUNTER — Telehealth: Payer: Self-pay | Admitting: *Deleted

## 2013-12-16 NOTE — Telephone Encounter (Signed)
Pt called and stated md didn't send medication to his pharmacy. Inform pt md sent amitriptyline & gabapentin to the [facility]/ [address]. Pt is at the [facility]/ [address]. Ask to speak to the pharmacist spoke with Aaron Edelman he will transfer both scripts...[name]

## 2014-01-14 ENCOUNTER — Other Ambulatory Visit: Payer: Self-pay | Admitting: Endocrinology

## 2014-02-04 ENCOUNTER — Ambulatory Visit: Payer: Medicare Other | Admitting: Endocrinology

## 2014-02-14 ENCOUNTER — Ambulatory Visit: Payer: Medicare Other | Admitting: Endocrinology

## 2014-02-14 DIAGNOSIS — Z0289 Encounter for other administrative examinations: Secondary | ICD-10-CM

## 2014-04-14 ENCOUNTER — Other Ambulatory Visit: Payer: Self-pay | Admitting: *Deleted

## 2014-04-14 MED ORDER — INSULIN DETEMIR 100 UNIT/ML FLEXPEN
60.0000 [IU] | PEN_INJECTOR | SUBCUTANEOUS | Status: DC
Start: 2014-04-14 — End: 2014-05-11

## 2014-05-09 ENCOUNTER — Ambulatory Visit: Payer: Medicare Other | Admitting: Internal Medicine

## 2014-05-11 ENCOUNTER — Encounter: Payer: Self-pay | Admitting: Endocrinology

## 2014-05-11 ENCOUNTER — Ambulatory Visit (INDEPENDENT_AMBULATORY_CARE_PROVIDER_SITE_OTHER): Payer: Medicare Other | Admitting: Endocrinology

## 2014-05-11 ENCOUNTER — Ambulatory Visit
Admission: RE | Admit: 2014-05-11 | Discharge: 2014-05-11 | Disposition: A | Discharge status: Home / Self Care | Payer: Medicare Other | Source: Ambulatory Visit | Attending: Endocrinology | Admitting: Endocrinology

## 2014-05-11 VITALS — BP 132/84 | HR 82 | Temp 98.0°F | Ht 72.0 in | Wt 165.0 lb

## 2014-05-11 DIAGNOSIS — E1029 Type 1 diabetes mellitus with other diabetic kidney complication: Secondary | ICD-10-CM

## 2014-05-11 DIAGNOSIS — M79644 Pain in right finger(s): Secondary | ICD-10-CM

## 2014-05-11 DIAGNOSIS — IMO0002 Reserved for concepts with insufficient information to code with codable children: Secondary | ICD-10-CM

## 2014-05-11 DIAGNOSIS — E1065 Type 1 diabetes mellitus with hyperglycemia: Secondary | ICD-10-CM

## 2014-05-11 DIAGNOSIS — S62609A Fracture of unspecified phalanx of unspecified finger, initial encounter for closed fracture: Secondary | ICD-10-CM

## 2014-05-11 LAB — HEMOGLOBIN A1C: HEMOGLOBIN A1C: 9.9 % — AB (ref 4.6–6.5)

## 2014-05-11 MED ORDER — INSULIN DETEMIR 100 UNIT/ML FLEXPEN: 50.0000 [IU] | PEN_INJECTOR | SUBCUTANEOUS | Status: DC
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
## 2014-05-11 NOTE — Patient Instructions (Addendum)
check your blood sugar 4 times a day--before the 3 meals, and at bedtime.  also check if you have symptoms of your blood sugar being too high or too low.  please keep a record of the readings and bring it to your next appointment here.  please call us sooner if you are having low blood sugar episodes. Please schedule a follow-up appointment in 3 months.    Take humalog, just 2 units any time the blood sugar is over 250.   blood tests and x-rays are being requested for you today.  We'll contact you with results.   On this type of insulin schedule, you should eat meals on a regular schedule.  If a meal is missed or significantly delayed, your blood sugar could go low.

## 2014-05-12 ENCOUNTER — Telehealth: Payer: Self-pay | Admitting: Endocrinology

## 2014-05-12 DIAGNOSIS — S62609A Fracture of unspecified phalanx of unspecified finger, initial encounter for closed fracture: Secondary | ICD-10-CM | POA: Insufficient documentation

## 2014-05-23 NOTE — Telephone Encounter (Signed)
error 

## 2014-06-17 ENCOUNTER — Emergency Department (HOSPITAL_COMMUNITY)
Admission: EM | Admit: 2014-06-17 | Discharge: 2014-06-17 | Disposition: A | Discharge status: Home / Self Care | Payer: Medicare Other | Attending: Emergency Medicine | Admitting: Emergency Medicine

## 2014-06-17 ENCOUNTER — Encounter (HOSPITAL_COMMUNITY): Payer: Self-pay | Admitting: Emergency Medicine

## 2014-06-17 DIAGNOSIS — E11359 Type 2 diabetes mellitus with proliferative diabetic retinopathy without macular edema: Secondary | ICD-10-CM | POA: Diagnosis not present

## 2014-06-17 DIAGNOSIS — Z720 Tobacco use: Secondary | ICD-10-CM | POA: Insufficient documentation

## 2014-06-17 DIAGNOSIS — K088 Other specified disorders of teeth and supporting structures: Secondary | ICD-10-CM | POA: Diagnosis present

## 2014-06-17 DIAGNOSIS — Z7982 Long term (current) use of aspirin: Secondary | ICD-10-CM | POA: Diagnosis not present

## 2014-06-17 DIAGNOSIS — K047 Periapical abscess without sinus: Secondary | ICD-10-CM

## 2014-06-17 DIAGNOSIS — Z8619 Personal history of other infectious and parasitic diseases: Secondary | ICD-10-CM | POA: Diagnosis not present

## 2014-06-17 DIAGNOSIS — I10 Essential (primary) hypertension: Secondary | ICD-10-CM | POA: Insufficient documentation

## 2014-06-17 DIAGNOSIS — F329 Major depressive disorder, single episode, unspecified: Secondary | ICD-10-CM | POA: Insufficient documentation

## 2014-06-17 DIAGNOSIS — K029 Dental caries, unspecified: Secondary | ICD-10-CM | POA: Insufficient documentation

## 2014-06-17 DIAGNOSIS — Z792 Long term (current) use of antibiotics: Secondary | ICD-10-CM | POA: Diagnosis not present

## 2014-06-17 DIAGNOSIS — Z79899 Other long term (current) drug therapy: Secondary | ICD-10-CM | POA: Insufficient documentation

## 2014-06-17 MED ORDER — TRAMADOL HCL 50 MG PO TABS
50.0000 mg | ORAL_TABLET | Freq: Once | ORAL | Status: AC
  Administered 2014-06-17: 50 mg via ORAL
  Filled 2014-06-17: qty 1

## 2014-06-17 MED ORDER — PENICILLIN V POTASSIUM 500 MG PO TABS: 500.0000 mg | ORAL_TABLET | Freq: Four times a day (QID) | ORAL | Status: AC

## 2014-06-17 MED ORDER — BUPIVACAINE-EPINEPHRINE (PF) 0.5% -1:200000 IJ SOLN
1.8000 mL | Freq: Once | INTRAMUSCULAR | Status: DC
  Filled 2014-06-17: qty 1.8

## 2014-06-17 MED ORDER — PENICILLIN V POTASSIUM 500 MG PO TABS
500.0000 mg | ORAL_TABLET | Freq: Once | ORAL | Status: AC
Start: 2014-06-17 — End: 2014-06-17
  Administered 2014-06-17: 500 mg via ORAL
  Filled 2014-06-17: qty 1

## 2014-06-17 MED ORDER — TRAMADOL HCL 50 MG PO TABS
50.0000 mg | ORAL_TABLET | Freq: Four times a day (QID) | ORAL | Status: DC | PRN
Start: 2014-06-17 — End: 2014-08-04

## 2014-06-17 NOTE — ED Notes (Addendum)
Per PTAR: Pt c/o L lower dental pain, onset yesterday, and some swelling to the area.

## 2014-06-17 NOTE — Discharge Instructions (Signed)
Dental Care and Dentist Visits Dental care supports good overall health. Regular dental visits can also help you avoid dental pain, bleeding, infection, and other more serious health problems in the future. It is important to keep the mouth healthy because diseases in the teeth, gums, and other oral tissues can spread to other areas of the body. Some problems, such as diabetes, heart disease, and pre-term labor have been associated with poor oral health.  See your dentist every 6 months. If you experience emergency problems such as a toothache or broken tooth, go to the dentist right away. If you see your dentist regularly, you may catch problems early. It is easier to be treated for problems in the early stages.  WHAT TO EXPECT AT A DENTIST VISIT  Your dentist will look for many common oral health problems and recommend proper treatment. At your regular dental visit, you can expect:  Gentle cleaning of the teeth and gums. This includes scraping and polishing. This helps to remove the sticky substance around the teeth and gums (plaque). Plaque forms in the mouth shortly after eating. Over time, plaque hardens on the teeth as tartar. If tartar is not removed regularly, it can cause problems. Cleaning also helps remove stains.  Periodic X-rays. These pictures of the teeth and supporting bone will help your dentist assess the health of your teeth.  Periodic fluoride treatments. Fluoride is a natural mineral shown to help strengthen teeth. Fluoride treatmentinvolves applying a fluoride gel or varnish to the teeth. It is most commonly done in children.  Examination of the mouth, tongue, jaws, teeth, and gums to look for any oral health problems, such as:  Cavities (dental caries). This is decay on the tooth caused by plaque, sugar, and acid in the mouth. It is best to catch a cavity when it is small.  Inflammation of the gums caused by plaque buildup (gingivitis).  Problems with the mouth or malformed  or misaligned teeth.  Oral cancer or other diseases of the soft tissues or jaws. KEEP YOUR TEETH AND GUMS HEALTHY For healthy teeth and gums, follow these general guidelines as well as your dentist's specific advice:  Have your teeth professionally cleaned at the dentist every 6 months.  Brush twice daily with a fluoride toothpaste.  Floss your teeth daily.  Ask your dentist if you need fluoride supplements, treatments, or fluoride toothpaste.  Eat a healthy diet. Reduce foods and drinks with added sugar.  Avoid smoking. TREATMENT FOR ORAL HEALTH PROBLEMS If you have oral health problems, treatment varies depending on the conditions present in your teeth and gums.  Your caregiver will most likely recommend good oral hygiene at each visit.  For cavities, gingivitis, or other oral health disease, your caregiver will perform a procedure to treat the problem. This is typically done at a separate appointment. Sometimes your caregiver will refer you to another dental specialist for specific tooth problems or for surgery. SEEK IMMEDIATE DENTAL CARE IF:  You have pain, bleeding, or soreness in the gum, tooth, jaw, or mouth area.  A permanent tooth becomes loose or separated from the gum socket.  You experience a blow or injury to the mouth or jaw area. Document Released: 12/12/2010 Document Revised: 06/24/2011 Document Reviewed: 12/12/2010 [facility] Patient Information 2015 [place], [place]. This information is not intended to replace advice given to you by your health care provider. Make sure you discuss any questions you have with your health care provider.  Abscessed Tooth An abscessed tooth is an infection  around your tooth. It may be caused by holes or damage to the tooth (cavity) or a dental disease. An abscessed tooth causes mild to very bad pain in and around the tooth. See your dentist right away if you have tooth or gum pain. HOME CARE  Take your medicine as told. Finish it  even if you start to feel better.  Do not drive after taking pain medicine.  Rinse your mouth (gargle) often with salt water ( teaspoon salt in 8 ounces of warm water).  Do not apply heat to the outside of your face. GET HELP RIGHT AWAY IF:   You have a temperature by mouth above 102 F (38.9 C), not controlled by medicine.  You have chills and a very bad headache.  You have problems breathing or swallowing.  Your mouth will not open.  You develop puffiness (swelling) on the neck or around the eye.  Your pain is not helped by medicine.  Your pain is getting worse instead of better. MAKE SURE YOU:   Understand these instructions.  Will watch your condition.  Will get help right away if you are not doing well or get worse. Document Released: 09/18/2007 Document Revised: 06/24/2011 Document Reviewed: 07/10/2010 [facility] Patient Information 2015 [place], [place]. This information is not intended to replace advice given to you by your health care provider. Make sure you discuss any questions you have with your health care provider. Please take the prescribed antibiotic as directed, please call Dr. Junita Push office first thing Monday morning, telling them you were referred through the emergency department.  They will make every effort to see you within a 24-hour period You're given an injection of lidocaine to help numb the area plus a first dose of antibiotic and pain medication.  Please make sure to take these on a regular basis

## 2014-06-17 NOTE — ED Notes (Signed)
Pt initially refused to have incision and drainage of dental abscess performed by NP; when RN went in with discharge paperwork, pt reported that he changed his mind and wants the drainage done. NP aware.

## 2014-06-17 NOTE — ED Provider Notes (Addendum)
CSN: [id number]     Arrival date & time 06/17/14  2134 History  This chart was scribed for non-physician practitioner Junius Creamer NP working with Ernestina Patches, MD by Lora Havens, ED Scribe. This patient was seen in [facility] and the patient's care was started at 9:37 PM.   Chief Complaint  Patient presents with  . Dental Pain   HPI  HPI Comments: [name] is a 59 y.o. male who presents to the Emergency Department complaining of lower dental abscess in the first and second left lower molar, onset 2-3 days ago. Pt's insurance does not cover dentist appointment. He says his doctor says he cannot take tylenol.   Past Medical History  Diagnosis Date  . Glaucoma   . HEPATITIS C     chronic  . DIABETES MELLITUS, TYPE I   . DRUG ABUSE     pt should have NO controlled substances rx'ed  . Vitiligo   . 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 DEPRESSION   . Hypertension 02/18/2011   Past Surgical History  Procedure Laterality Date  . Eye surgery      retinal surgery x 2, right eye   Family History  Problem Relation Age of Onset  . Arthritis Mother   . Heart disease Mother     CAD  . Diabetes Father   . Kidney disease Father    History  Substance Use Topics  . Smoking status: Current Every Day Smoker -- 2.00 packs/day    Types: Cigarettes  . Smokeless tobacco: Not on file     Comment: Single, lives in boarding house. disable, prev mental health drug counselor.   . Alcohol Use: No    Review of Systems  Allergies  Codeine  Home Medications   Prior to Admission medications   Medication Sig Start Date End Date Taking? Authorizing Provider  Insulin Detemir (LEVEMIR) 100 UNIT/ML Pen Inject 50 Units into the skin every morning. And pen needles 2/day Patient taking differently: Inject 50 Units into the skin daily. And pen needles 2/day 05/11/14  Yes Renato Shin, MD  insulin lispro (HUMALOG) 100 UNIT/ML injection 2 units for any blood sugar over 250 Patient taking  differently: Inject 2-10 Units into the skin 3 (three) times daily with meals. Per sliding scale 12/21/12  Yes Renato Shin, MD  amitriptyline (ELAVIL) 10 MG tablet Take 1 tablet (10 mg total) by mouth at bedtime. Patient not taking: Reported on 06/17/2014 12/08/13   Rowe Clack, MD  aspirin EC 81 MG tablet Take 1 tablet (81 mg total) by mouth daily. 07/06/13   Wellington Hampshire, MD  B-D ULTRAFINE III SHORT PEN 31G X 8 MM MISC USE AS DIRECTED 02/06/12   Renato Shin, MD  betamethasone dipropionate (DIPROLENE) 0.05 % cream Apply topically 2 (two) times daily. Patient not taking: Reported on 06/17/2014 12/08/13   Rowe Clack, MD  gabapentin (NEURONTIN) 100 MG capsule Take 1 capsule (100 mg total) by mouth at bedtime. Patient not taking: Reported on 06/17/2014 12/08/13   Rowe Clack, MD  ONE TOUCH ULTRA TEST test strip TEST BLOOD SUGAR FOUR TIMES DAILY 11/20/12   Renato Shin, MD  penicillin v potassium (VEETID) 500 MG tablet Take 1 tablet (500 mg total) by mouth 4 (four) times daily. 06/17/14 06/24/14  Garald Balding, NP  traMADol (ULTRAM) 50 MG tablet Take 1 tablet (50 mg total) by mouth every 6 (six) hours as needed. 06/17/14   Garald Balding, NP   BP 181/83  mmHg  Temp(Src) 98 F (36.7 C) (Oral)  Resp 18  SpO2 100% Physical Exam  Constitutional: He appears well-developed.  HENT:  Head: Normocephalic.  Mouth/Throat:    Neck: Normal range of motion.    ED Course  Procedures  DIAGNOSTIC STUDIES: Oxygen Saturation is 100% on room air, normal by my interpretation.    COORDINATION OF CARE: 9:40 PM Discussed treatment plan with pt at bedside and pt agreed to plan.  Labs Review Labs Reviewed - No data to display  Imaging Review No results found.   EKG Interpretation None     Patient refused dental block or I and D of abscess At time of discharge patient agreed to have a dental block, which was performed.  Patient reports, that he's had some relief from his discomfort.  He now  reports that he lives in a homeless shelter will need a Bus pass MDM   Final diagnoses:  Dental abscess    I personally performed the services described in this documentation, which was scribed in my presence. The recorded information has been reviewed and is accurate.     Garald Balding, NP 06/17/14 2147  Garald Balding, NP 06/17/14 2214  Garald Balding, NP 06/17/14 [place], MD 06/18/14 0031  Junius Creamer, NP 07/04/14 1950  Ernestina Patches, MD 07/07/14 [phone]

## 2014-08-04 ENCOUNTER — Other Ambulatory Visit (INDEPENDENT_AMBULATORY_CARE_PROVIDER_SITE_OTHER): Payer: Medicare Other

## 2014-08-04 ENCOUNTER — Ambulatory Visit (INDEPENDENT_AMBULATORY_CARE_PROVIDER_SITE_OTHER): Payer: Medicare Other | Admitting: Internal Medicine

## 2014-08-04 ENCOUNTER — Encounter: Payer: Self-pay | Admitting: Internal Medicine

## 2014-08-04 VITALS — BP 132/60 | HR 76 | Temp 97.5°F | Ht 72.0 in | Wt 161.2 lb

## 2014-08-04 DIAGNOSIS — B182 Chronic viral hepatitis C: Secondary | ICD-10-CM

## 2014-08-04 DIAGNOSIS — E1042 Type 1 diabetes mellitus with diabetic polyneuropathy: Secondary | ICD-10-CM

## 2014-08-04 DIAGNOSIS — I10 Essential (primary) hypertension: Secondary | ICD-10-CM

## 2014-08-04 DIAGNOSIS — IMO0002 Reserved for concepts with insufficient information to code with codable children: Secondary | ICD-10-CM

## 2014-08-04 DIAGNOSIS — E1065 Type 1 diabetes mellitus with hyperglycemia: Principal | ICD-10-CM

## 2014-08-04 DIAGNOSIS — E1029 Type 1 diabetes mellitus with other diabetic kidney complication: Secondary | ICD-10-CM

## 2014-08-04 DIAGNOSIS — S62609A Fracture of unspecified phalanx of unspecified finger, initial encounter for closed fracture: Secondary | ICD-10-CM

## 2014-08-04 LAB — BASIC METABOLIC PANEL
BUN: 16 mg/dL (ref 6–23)
CHLORIDE: 102 meq/L (ref 96–112)
CO2: 32 meq/L (ref 19–32)
Calcium: 9.7 mg/dL (ref 8.4–10.5)
Creatinine, Ser: 0.86 mg/dL (ref 0.40–1.50)
GFR: 97.13 mL/min (ref >60.00)
GLUCOSE: 137 mg/dL — AB (ref 70–99)
Potassium: 4.7 mEq/L (ref 3.5–5.1)
SODIUM: 140 meq/L (ref 135–145)

## 2014-08-04 LAB — HEPATIC FUNCTION PANEL
ALT: 30 U/L (ref 0–53)
AST: 29 U/L (ref 0–37)
Albumin: 3.2 g/dL — ABNORMAL LOW (ref 3.5–5.2)
Alkaline Phosphatase: 126 U/L — ABNORMAL HIGH (ref 39–117)
BILIRUBIN DIRECT: 0.1 mg/dL (ref 0.0–0.3)
BILIRUBIN TOTAL: 0.4 mg/dL (ref 0.2–1.2)
Total Protein: 6.8 g/dL (ref 6.0–8.3)

## 2014-08-04 LAB — HEMOGLOBIN A1C: Hgb A1c MFr Bld: 9.9 % — ABNORMAL HIGH (ref 4.6–6.5)

## 2014-08-04 MED ORDER — INSULIN DETEMIR 100 UNIT/ML FLEXPEN
50.0000 [IU] | PEN_INJECTOR | Freq: Every day | SUBCUTANEOUS | Status: DC
Start: 2014-08-04 — End: 2014-11-30

## 2014-08-04 MED ORDER — LOSARTAN POTASSIUM 50 MG PO TABS: 50.0000 mg | ORAL_TABLET | Freq: Every day | ORAL | Status: DC

## 2014-08-04 MED ORDER — PREGABALIN 50 MG PO CAPS: 50.0000 mg | ORAL_CAPSULE | Freq: Three times a day (TID) | ORAL | Status: DC

## 2014-08-04 NOTE — Assessment & Plan Note (Signed)
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

## 2014-08-04 NOTE — Progress Notes (Signed)
Subjective:    Patient ID: [name], male    DOB: 1955/05/05, 59 y.o.   MRN: [id number]  HPI  Patient here for followup = reviewed chronic medical issues, current concerns and interval events  Past Medical History  Diagnosis Date  . Glaucoma   . HEPATITIS C     chronic  . DIABETES MELLITUS, TYPE I   . DRUG ABUSE     pt should have NO controlled substances rx'ed  . Vitiligo   . 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 DEPRESSION   . Hypertension 02/18/2011    Review of Systems  Constitutional: Negative for fatigue and unexpected weight change.  Respiratory: Negative for cough and shortness of breath.   Cardiovascular: Negative for chest pain and leg swelling.  Musculoskeletal: Positive for arthralgias (R little finger since fx 12/15). Negative for gait problem.  Neurological: Positive for numbness (BLE neuropathy - chronic).       Objective:    Physical Exam  Constitutional: He appears well-developed and well-nourished.  Cardiovascular: Normal rate and regular rhythm.   No murmur heard. Pulmonary/Chest: Effort normal and breath sounds normal.  Skin: No rash noted. No erythema.  Vitiligo, chronic    BP 132/60 mmHg  Pulse 76  Temp(Src) 97.5 F (36.4 C) (Oral)  Ht 6' (1.829 m)  Wt 161 lb 4 oz (73.143 kg)  BMI 21.86 kg/m2  SpO2 95% Wt Readings from Last 3 Encounters:  08/04/14 161 lb 4 oz (73.143 kg)  05/11/14 165 lb (74.844 kg)  12/08/13 165 lb 4 oz (74.957 kg)     Lab Results  Component Value Date   WBC 10.1 12/08/2013   HGB 14.8 12/08/2013   HCT 44.4 12/08/2013   PLT 308.0 12/08/2013   GLUCOSE 413* 11/16/2013   CHOL 184 11/02/2013   TRIG 138.0 11/02/2013   HDL 73.40 11/02/2013   LDLCALC 83 11/02/2013   ALT 40 12/08/2013   AST 46* 12/08/2013   NA 131* 11/16/2013   K 3.8 11/16/2013   CL 92* 11/16/2013   CREATININE 0.72 11/16/2013   BUN 13 11/16/2013   CO2 26 11/16/2013   TSH 0.71 02/18/2011   PSA 0.36 11/19/2006   HGBA1C 9.9*  05/11/2014   MICROALBUR 128.2* 11/02/2013    No results found.     Assessment & Plan:   Problem List Items Addressed This Visit    Chronic hepatitis C    Prior evaluation 2007 by hepatologist for same - never underwent treatment to to ongoing drug abuse at the time Declined IFN due to depression hx and prior failure UDS, declines oral tx because of cost Recheck LFTs now to monitor same      Relevant Orders   Hepatic function panel   Finger fracture    Fracture R 5th finger fractured 03/2014 Now healed without pain but requests ortho eval because of deformity of contracted/flexed positioning      Relevant Orders   Ambulatory referral to Orthopedic Surgery   Hypertension    started ARB 02/2011 after reviewing indications for "renal protection from DM" Stopped taking because of homelessness and cost - agrees to restart now - erx done  BP Readings from Last 3 Encounters:  08/04/14 132/60  06/17/14 181/83  05/11/14 132/84         Relevant Medications   losartan (COZAAR) 50 MG tablet   Other Relevant Orders   Basic metabolic panel   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 - Primary    Therapy  historically limited by compliance and social determinates complicating care 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 Follows with endo ([facility]), continue same recommendations Because of neuropathy, will start Lyrica (prior trial amitriptyline and gabapentin qhs ineffective at pain relief)  Lab Results  Component Value Date   HGBA1C 9.9* 05/11/2014        Relevant Medications   losartan (COZAAR) 50 MG tablet   Insulin Detemir (LEVEMIR) 100 UNIT/ML Pen   Other Relevant Orders   Hemoglobin 123456   Basic metabolic panel   Ambulatory referral to Ophthalmology    Other Visit Diagnoses    Diabetic polyneuropathy associated with type 1 diabetes mellitus        Relevant Medications    losartan (COZAAR) 50 MG tablet    Insulin Detemir  (LEVEMIR) 100 UNIT/ML Pen    Other Relevant Orders    Hemoglobin 123456    Basic metabolic panel    Ambulatory referral to Ophthalmology        Gwendolyn Grant, MD

## 2014-08-04 NOTE — Patient Instructions (Signed)
It was good to see you today.  We have reviewed your prior records including labs and tests today  Test(s) ordered today. Your results will be released to [place] (or called to you) after review, usually within 72hours after test completion. If any changes need to be made, you will be notified at that same time.  Medications reviewed and updated Start Lyrica for neuropathy and losartan for blood pressure and kidney protection - no other changes recommended at this time. Refill on medication(s) as discussed today.  we'll make referral to eye doc and to finger doc . Our office will contact you regarding appointment(s) once made.  Keep appointment with Loanne Drilling as planned   Please schedule followup in 6 months, call sooner if problems.

## 2014-08-04 NOTE — Progress Notes (Signed)
Pre visit review using our clinic review tool, if applicable. No additional management support is needed unless otherwise documented below in the visit note. 

## 2014-08-04 NOTE — Assessment & Plan Note (Signed)
Prior evaluation 2007 by hepatologist for same - never underwent treatment to to ongoing drug abuse at the time Declined IFN due to depression hx and prior failure UDS, declines oral tx because of cost Recheck LFTs now to monitor same

## 2014-08-04 NOTE — Assessment & Plan Note (Signed)
started ARB 02/2011 after reviewing indications for "renal protection from DM" Stopped taking because of homelessness and cost - agrees to restart now - erx done  BP Readings from Last 3 Encounters:  08/04/14 132/60  06/17/14 181/83  05/11/14 132/84

## 2014-08-04 NOTE — Assessment & Plan Note (Signed)
Fracture R 5th finger fractured 03/2014 Now healed without pain but requests ortho eval because of deformity of contracted/flexed positioning

## 2014-08-10 ENCOUNTER — Encounter: Payer: Self-pay | Admitting: Endocrinology

## 2014-08-10 ENCOUNTER — Ambulatory Visit (INDEPENDENT_AMBULATORY_CARE_PROVIDER_SITE_OTHER): Payer: Medicare Other | Admitting: Endocrinology

## 2014-08-10 VITALS — BP 104/70 | HR 81 | Temp 98.3°F | Wt 162.0 lb

## 2014-08-10 DIAGNOSIS — IMO0002 Reserved for concepts with insufficient information to code with codable children: Secondary | ICD-10-CM

## 2014-08-10 DIAGNOSIS — E1029 Type 1 diabetes mellitus with other diabetic kidney complication: Secondary | ICD-10-CM | POA: Diagnosis not present

## 2014-08-10 DIAGNOSIS — E1065 Type 1 diabetes mellitus with hyperglycemia: Principal | ICD-10-CM

## 2014-08-10 NOTE — Progress Notes (Signed)
Subjective:    Patient ID: [name], male    DOB: 1956-02-24, 59 y.o.   MRN: [id number]  HPI Pt returns for f/u of diabetes mellitus: DM type: 1 Dx'ed: 123XX123 Complications: polyneuropathy, nephropathy and retinopathy Therapy: insulin since dx DKA: never Severe hypoglycemia:  last episode was in 2015.  Pancreatitis: never Other: he has done better with a simpler qd regimen; he was changed from lantus to levemir, due to mild hypoglycemia in the early hours of the morning Interval history: Pt says he has been living in a shelter.  He says he has hypoglycemia very seldom.  no cbg record, but states cbg's vary from 24-400.  There is no trend throughout the day.  He had another episode of severe hypoglycemia a few weeks ago.  This happened after a missed meal. Past Medical History  Diagnosis Date  . Glaucoma   . HEPATITIS C     chronic  . DIABETES MELLITUS, TYPE I   . DRUG ABUSE     pt should have NO controlled substances rx'ed  . Vitiligo   . 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 DEPRESSION   . Hypertension 02/18/2011    Past Surgical History  Procedure Laterality Date  . Eye surgery      retinal surgery x 2, right eye    History   Social History  . Marital Status: Single    Spouse Name: N/A  . Number of Children: N/A  . Years of Education: N/A   Occupational History  .      disabled   Social History Main Topics  . Smoking status: Current Every Day Smoker -- 2.00 packs/day    Types: Cigarettes  . Smokeless tobacco: Not on file  . Alcohol Use: No  . Drug Use: Yes    Special: "Crack" cocaine     Comment: s/p rehab x 6  . Sexual Activity: Not on file   Other Topics Concern  . Not on file   Social History Narrative   Lives in boarding house   Ongoing occ drug use-crack   Single, disabled, prev mental health drug counselor.     Current Outpatient Prescriptions on File Prior to Visit  Medication Sig Dispense Refill  . B-D ULTRAFINE III SHORT PEN  31G X 8 MM MISC USE AS DIRECTED 100 each 0  . Insulin Detemir (LEVEMIR) 100 UNIT/ML Pen Inject 50 Units into the skin daily. And pen needles 2/day 30 mL 11  . losartan (COZAAR) 50 MG tablet Take 1 tablet (50 mg total) by mouth daily. 90 tablet 3  . ONE TOUCH ULTRA TEST test strip TEST BLOOD SUGAR FOUR TIMES DAILY 150 each 2  . pregabalin (LYRICA) 50 MG capsule Take 1 capsule (50 mg total) by mouth 3 (three) times daily. 90 capsule 3   No current facility-administered medications on file prior to visit.    Allergies  Allergen Reactions  . Codeine Itching    Family History  Problem Relation Age of Onset  . Arthritis Mother   . Heart disease Mother     CAD  . Diabetes Father   . Kidney disease Father     BP 104/70 mmHg  Pulse 81  Temp(Src) 98.3 F (36.8 C) (Oral)  Wt 162 lb (73.483 kg)  SpO2 97%   Review of Systems Denies weight change.  Chronic leg pain is unchanged.  He says the lyrica caused lightheadedness, so he stopped it.     Objective:   Physical Exam  VITAL SIGNS:  See vs page GENERAL: no distress pulses: dorsalis pedis intact bilat.  Feet: no deformity. no ulcer on the feet. feet are of normal color and temp. no edema. There is bilateral onychomycosis. The left 2nd toenail is traumatically absent.  Neuro: sensation is intact to touch on thefeet, but severely decreased from normal.    Lab Results  Component Value Date   HGBA1C 9.9* 08/04/2014      Assessment & Plan:  DM: therapy limited by noncompliance with cbg recording, and by frequent severe hypoglycemia.  i'll do the best i can, but he is not a candidate for aggressive glycemic control.  Pt declines V-GO  Patient is advised the following: Patient Instructions  check your blood sugar 4 times a day--before the 3 meals, and at bedtime.  also check if you have symptoms of your blood sugar being too high or too low.  please keep a record of the readings and bring it to your next appointment here.  please  call us sooner if you are having low blood sugar episodes. Please schedule a follow-up appointment in 3 months.  Take humalog, just 2 units any time the blood sugar is over 250.   On this type of insulin schedule, you should eat meals on a regular schedule.  If a meal is missed or significantly delayed, your blood sugar could go low.

## 2014-08-10 NOTE — Patient Instructions (Addendum)
check your blood sugar 4 times a day--before the 3 meals, and at bedtime.  also check if you have symptoms of your blood sugar being too high or too low.  please keep a record of the readings and bring it to your next appointment here.  please call us sooner if you are having low blood sugar episodes. Please schedule a follow-up appointment in 3 months.  Take humalog, just 2 units any time the blood sugar is over 250.   On this type of insulin schedule, you should eat meals on a regular schedule.  If a meal is missed or significantly delayed, your blood sugar could go low.

## 2014-08-31 ENCOUNTER — Ambulatory Visit: Payer: Medicare Other | Admitting: Internal Medicine

## 2014-08-31 DIAGNOSIS — Z0289 Encounter for other administrative examinations: Secondary | ICD-10-CM

## 2014-09-01 ENCOUNTER — Ambulatory Visit: Payer: Medicare Other | Admitting: Internal Medicine

## 2014-09-01 DIAGNOSIS — Z0289 Encounter for other administrative examinations: Secondary | ICD-10-CM

## 2014-09-01 DIAGNOSIS — R4689 Other symptoms and signs involving appearance and behavior: Secondary | ICD-10-CM | POA: Insufficient documentation

## 2014-10-04 ENCOUNTER — Telehealth: Payer: Self-pay | Admitting: Internal Medicine

## 2014-10-04 MED ORDER — INSULIN GLARGINE 300 UNIT/ML ~~LOC~~ SOPN: 100.0000 [IU] | PEN_INJECTOR | Freq: Every day | SUBCUTANEOUS | Status: DC

## 2014-10-04 NOTE — Telephone Encounter (Signed)
Pt called in, he left a shelter this weekend and his pens that had to last him the rest of the month, he thinks they fell out of his bag he cant find them.  He is so worried about not having it because he goes into Diabetic comma   if doesn't have it?  Do we have any samples we can give him that is similar to help him make it to the 1st of the month ?

## 2014-10-04 NOTE — Telephone Encounter (Signed)
Pt came into the office. I asked Dr. Doug Sou what equivalents we have to Levemir. She stated that the toujeo is long acting. Instructed pt to use 100 units of toujeo daily. Gave pt 3 sample pens.

## 2014-10-04 NOTE — Telephone Encounter (Signed)
Can we give pt (if we have any) some sample of something similar to levemir?

## 2014-11-02 ENCOUNTER — Ambulatory Visit: Payer: Medicare Other | Admitting: Endocrinology

## 2014-11-30 ENCOUNTER — Encounter: Payer: Self-pay | Admitting: Endocrinology

## 2014-11-30 ENCOUNTER — Ambulatory Visit (INDEPENDENT_AMBULATORY_CARE_PROVIDER_SITE_OTHER): Payer: Medicare Other | Admitting: Endocrinology

## 2014-11-30 ENCOUNTER — Other Ambulatory Visit (INDEPENDENT_AMBULATORY_CARE_PROVIDER_SITE_OTHER): Payer: Medicare Other | Admitting: *Deleted

## 2014-11-30 VITALS — BP 138/60 | HR 72 | Temp 98.2°F | Ht 72.0 in | Wt 155.0 lb

## 2014-11-30 DIAGNOSIS — E1029 Type 1 diabetes mellitus with other diabetic kidney complication: Secondary | ICD-10-CM

## 2014-11-30 DIAGNOSIS — IMO0002 Reserved for concepts with insufficient information to code with codable children: Secondary | ICD-10-CM

## 2014-11-30 DIAGNOSIS — E1065 Type 1 diabetes mellitus with hyperglycemia: Principal | ICD-10-CM

## 2014-11-30 DIAGNOSIS — E109 Type 1 diabetes mellitus without complications: Secondary | ICD-10-CM

## 2014-11-30 LAB — HEPATIC FUNCTION PANEL
ALBUMIN: 3.3 g/dL — AB (ref 3.5–5.2)
ALK PHOS: 211 U/L — AB (ref 39–117)
ALT: 72 U/L — AB (ref 0–53)
AST: 60 U/L — AB (ref 0–37)
BILIRUBIN DIRECT: 0.1 mg/dL (ref 0.0–0.3)
TOTAL PROTEIN: 6.7 g/dL (ref 6.0–8.3)
Total Bilirubin: 0.4 mg/dL (ref 0.2–1.2)

## 2014-11-30 LAB — POCT GLYCOSYLATED HEMOGLOBIN (HGB A1C): Hemoglobin A1C: 9.6

## 2014-11-30 LAB — BASIC METABOLIC PANEL
BUN: 19 mg/dL (ref 6–23)
CHLORIDE: 98 meq/L (ref 96–112)
CO2: 30 meq/L (ref 19–32)
Calcium: 9.3 mg/dL (ref 8.4–10.5)
Creatinine, Ser: 0.86 mg/dL (ref 0.40–1.50)
GFR: 97.03 mL/min (ref >60.00)
GLUCOSE: 416 mg/dL — AB (ref 70–99)
POTASSIUM: 4.7 meq/L (ref 3.5–5.1)
SODIUM: 132 meq/L — AB (ref 135–145)

## 2014-11-30 MED ORDER — INSULIN DETEMIR 100 UNIT/ML FLEXPEN: 55.0000 [IU] | PEN_INJECTOR | SUBCUTANEOUS | Status: DC

## 2014-11-30 NOTE — Progress Notes (Signed)
Subjective:    Patient ID: [name], male    DOB: Nov 17, 1955, 59 y.o.   MRN: [id number]  HPI Pt returns for f/u of diabetes mellitus: DM type: 1 Dx'ed: 123XX123 Complications: polyneuropathy, nephropathy and retinopathy Therapy: insulin since dx DKA: never Severe hypoglycemia:  last episode was in 2015.  Pancreatitis: never Other: he has done better with a simpler qd regimen; he was changed from lantus to levemir, due to mild hypoglycemia in the early hours of the morning Interval history: He seldom checks cbg's, but he says he says he never misses the insulin.  He denies hypoglycemia.  He says cbg's are persistently "high." Past Medical History  Diagnosis Date  . Glaucoma   . HEPATITIS C     chronic  . DIABETES MELLITUS, TYPE I   . DRUG ABUSE     pt should have NO controlled substances rx'ed  . Vitiligo   . 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 DEPRESSION   . Hypertension 02/18/2011    Past Surgical History  Procedure Laterality Date  . Eye surgery      retinal surgery x 2, right eye    Social History   Social History  . Marital Status: Single    Spouse Name: N/A  . Number of Children: N/A  . Years of Education: N/A   Occupational History  .      disabled   Social History Main Topics  . Smoking status: Current Every Day Smoker -- 2.00 packs/day    Types: Cigarettes  . Smokeless tobacco: Not on file  . Alcohol Use: No  . Drug Use: Yes    Special: "Crack" cocaine     Comment: s/p rehab x 6  . Sexual Activity: Not on file   Other Topics Concern  . Not on file   Social History Narrative   Lives in boarding house   Ongoing occ drug use-crack   Single, disabled, prev mental health drug counselor.     Current Outpatient Prescriptions on File Prior to Visit  Medication Sig Dispense Refill  . B-D ULTRAFINE III SHORT PEN 31G X 8 MM MISC USE AS DIRECTED 100 each 0  . ONE TOUCH ULTRA TEST test strip TEST BLOOD SUGAR FOUR TIMES DAILY 150 each 2  .  losartan (COZAAR) 50 MG tablet Take 1 tablet (50 mg total) by mouth daily. (Patient not taking: Reported on 11/30/2014) 90 tablet 3  . pregabalin (LYRICA) 50 MG capsule Take 1 capsule (50 mg total) by mouth 3 (three) times daily. (Patient not taking: Reported on 11/30/2014) 90 capsule 3   No current facility-administered medications on file prior to visit.    Allergies  Allergen Reactions  . Codeine Itching    Family History  Problem Relation Age of Onset  . Arthritis Mother   . Heart disease Mother     CAD  . Diabetes Father   . Kidney disease Father     BP 138/60 mmHg  Pulse 72  Temp(Src) 98.2 F (36.8 C) (Oral)  Ht 6' (1.829 m)  Wt 155 lb (70.308 kg)  BMI 21.02 kg/m2  SpO2 95%  Review of Systems No change in chronic painful neuropathy.      Objective:   Physical Exam VITAL SIGNS:  See vs page GENERAL: no distress pulses: dorsalis pedis intact bilat.  Feet: no deformity. no edema. There is bilateral onychomycosis. The left 2nd toenail is traumatically absent.  Skin: no ulcer on the feet. feet are of normal color  and temp Neuro: sensation is intact to touch on thefeet, but severely decreased from normal.    A1c=9.4% Lab Results  Component Value Date   ALT 72* 11/30/2014   AST 60* 11/30/2014   ALKPHOS 211* 11/30/2014   BILITOT 0.4 11/30/2014       Assessment & Plan:  DM: he needs increased rx. Chronic hep-C, worse.  He declines ref back to hepatology.  Patient is advised the following: Patient Instructions  check your blood sugar 4 times a day--before the 3 meals, and at bedtime.  also check if you have symptoms of your blood sugar being too high or too low.  please keep a record of the readings and bring it to your next appointment here.  please call us sooner if you are having low blood sugar episodes.  This is really important, due to the need to keep your blood sugar lower.  Please schedule a follow-up appointment in 3 months.   Please increase the  levemir to 55 units each morning Take humalog, just 2 units any time the blood sugar is over 250.   On this type of insulin schedule, you should eat meals on a regular schedule.  If a meal is missed or significantly delayed, your blood sugar could go low.  blood tests are requested for you today.  We'll let you know about the results.

## 2014-11-30 NOTE — Patient Instructions (Addendum)
check your blood sugar 4 times a day--before the 3 meals, and at bedtime.  also check if you have symptoms of your blood sugar being too high or too low.  please keep a record of the readings and bring it to your next appointment here.  please call us sooner if you are having low blood sugar episodes.  This is really important, due to the need to keep your blood sugar lower.  Please schedule a follow-up appointment in 3 months.   Please increase the levemir to 55 units each morning Take humalog, just 2 units any time the blood sugar is over 250.   On this type of insulin schedule, you should eat meals on a regular schedule.  If a meal is missed or significantly delayed, your blood sugar could go low.  blood tests are requested for you today.  We'll let you know about the results.

## 2015-01-09 ENCOUNTER — Telehealth: Payer: Self-pay | Admitting: Internal Medicine

## 2015-01-09 ENCOUNTER — Telehealth: Payer: Self-pay | Admitting: Endocrinology

## 2015-01-09 NOTE — Telephone Encounter (Signed)
Pt went to get his prescription for Insulin Detemir (LEVEMIR FLEXTOUCH) 100 UNIT/ML Pen [id number] and he is currently in the donut hole and it will cost him $800. He first called Dr. Cordelia Pen office and they told him to call here. He does not qualify for free insulin but he has applied for an assistance program and it could take a few weeks. He is hoping you have samples or some other plan for him He is out

## 2015-01-09 NOTE — Telephone Encounter (Signed)
Please call pt when you get the samples for him [phone]

## 2015-01-09 NOTE — Telephone Encounter (Signed)
yes

## 2015-01-09 NOTE — Telephone Encounter (Signed)
Patient stated that his medication levemire is $800.00 don't have the money is wondering if you have any samples, he is totally out. please advise

## 2015-01-09 NOTE — Telephone Encounter (Signed)
Looked for Levemir but there aren't any samples.  Is the tresiba similar?

## 2015-01-09 NOTE — Telephone Encounter (Signed)
Pt was advised we do not have any samples. Pt was advised to contact his PCP about samples or discount cards.

## 2015-01-10 NOTE — Telephone Encounter (Signed)
Patient came in.  I gave him two boxes of Tresiba 200 ml.  Can you please follow up with patient to make sure he is using correctly and also patient would like to know if he could pick up more later to get him through until the patient assistance forms get processed through by Dr. Cordelia Pen office.

## 2015-01-10 NOTE — Telephone Encounter (Signed)
[place] with me.  However, it is not a unit-for-unit conversion, so please start lantus at 35 units daily.  We may need to adjust this, so please let us know how cbg's are doing.

## 2015-01-10 NOTE — Telephone Encounter (Signed)
Patient called again regarding his insulin. I spoke w/Stefannie and was advised to let the patient know we have samples of something similar to his levemir and those will be available for him to pick up at his convenience. Pt understood.

## 2015-01-10 NOTE — Telephone Encounter (Signed)
Pt has received a sample of tresiba to last 8 days and pt is asking if we could approve him taking the long acting lantus solostar he can get from a friend of his,

## 2015-01-10 NOTE — Telephone Encounter (Signed)
See note below and please advise.

## 2015-01-11 MED ORDER — INSULIN DEGLUDEC 200 UNIT/ML ~~LOC~~ SOPN
55.0000 [IU] | PEN_INJECTOR | Freq: Every day | SUBCUTANEOUS | Status: DC
Start: 2015-01-11 — End: 2015-02-06

## 2015-01-11 NOTE — Telephone Encounter (Signed)
Pt informed that the units of the tresiba is the same as the units of the levemir.

## 2015-01-12 ENCOUNTER — Encounter: Payer: Self-pay | Admitting: Endocrinology

## 2015-01-12 ENCOUNTER — Ambulatory Visit (INDEPENDENT_AMBULATORY_CARE_PROVIDER_SITE_OTHER): Payer: Medicare Other | Admitting: Endocrinology

## 2015-01-12 DIAGNOSIS — Z23 Encounter for immunization: Secondary | ICD-10-CM | POA: Diagnosis not present

## 2015-01-12 NOTE — Telephone Encounter (Signed)
Patient has an appointment on 01/12/2015. Medications will be addressed then

## 2015-01-12 NOTE — Progress Notes (Signed)
Subjective:    Patient ID: [name], male    DOB: [birth year], 59 y.o.   MRN: [id number]  HPI Pt returns for f/u of diabetes mellitus: DM type: 1 Dx'ed: 123XX123 Complications: polyneuropathy, nephropathy and retinopathy Therapy: insulin since dx DKA: never Severe hypoglycemia:  last episode was in 2015.  Pancreatitis: never Other: he has done better with a simpler qd regimen; he was changed from lantus to levemir, due to mild hypoglycemia in the early hours of the morning.  Interval history: Pt says he hit the donut hole, so levemir has become very expensive.  He received a sample of tresiba.  He says cbg's are in the 100's.  He had another episode of severe hypoglycemia 2 week ago, before he ran out of levemir.   Past Medical History  Diagnosis Date  . Glaucoma   . HEPATITIS C     chronic  . DIABETES MELLITUS, TYPE I   . DRUG ABUSE     pt should have NO controlled substances rx'ed  . Vitiligo   . 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 DEPRESSION   . Hypertension 02/18/2011    Past Surgical History  Procedure Laterality Date  . Eye surgery      retinal surgery x 2, right eye    Social History   Social History  . Marital Status: Single    Spouse Name: N/A  . Number of Children: N/A  . Years of Education: N/A   Occupational History  .      disabled   Social History Main Topics  . Smoking status: Current Every Day Smoker -- 2.00 packs/day    Types: Cigarettes  . Smokeless tobacco: Not on file  . Alcohol Use: No  . Drug Use: Yes    Special: "Crack" cocaine     Comment: s/p rehab x 6  . Sexual Activity: Not on file   Other Topics Concern  . Not on file   Social History Narrative   Lives in boarding house   Ongoing occ drug use-crack   Single, disabled, prev mental health drug counselor.     Current Outpatient Prescriptions on File Prior to Visit  Medication Sig Dispense Refill  . B-D ULTRAFINE III SHORT PEN 31G X 8 MM MISC USE AS DIRECTED 100  each 0  . Insulin Degludec (TRESIBA FLEXTOUCH) 200 UNIT/ML SOPN Inject 55 Units into the skin daily. 6 pen 0  . Insulin Detemir (LEVEMIR FLEXTOUCH) 100 UNIT/ML Pen Inject 55 Units into the skin every morning. And pen needles 1/day 30 mL 11  . losartan (COZAAR) 50 MG tablet Take 1 tablet (50 mg total) by mouth daily. 90 tablet 3  . ONE TOUCH ULTRA TEST test strip TEST BLOOD SUGAR FOUR TIMES DAILY 150 each 2  . pregabalin (LYRICA) 50 MG capsule Take 1 capsule (50 mg total) by mouth 3 (three) times daily. 90 capsule 3   No current facility-administered medications on file prior to visit.    Allergies  Allergen Reactions  . Codeine Itching    Family History  Problem Relation Age of Onset  . Arthritis Mother   . Heart disease Mother     CAD  . Diabetes Father   . Kidney disease Father     BP 128/87 mmHg  Pulse 80  Temp(Src) 98.2 F (36.8 C) (Oral)  Ht 6' (1.829 m)  Wt 151 lb (68.493 kg)  BMI 20.47 kg/m2  SpO2 97%  Review of Systems He denies recent weight change.  Objective:   Physical Exam VITAL SIGNS:  See vs page GENERAL: no distress PSYCH: Alert and well-oriented.  Does not appear anxious nor depressed.      Assessment & Plan:  DM: complex physiologic, social, and financial situation limits rx.  We'll do our best.  i told pt we'll fax the pt assist forms, to help him get levemir faster.    Patient is advised the following: Patient Instructions  When you run out of the tresiba, buy [facility] NPH insulin, 30 units each morning and 10 units each evening.   We'll fax the patient assistance forms. Please come back for a follow-up appointment in 2 months.

## 2015-01-12 NOTE — Telephone Encounter (Signed)
Pt is currently without a phone at this time.

## 2015-01-12 NOTE — Patient Instructions (Addendum)
When you run out of the tresiba, buy [facility] NPH insulin, 30 units each morning and 10 units each evening.   We'll fax the patient assistance forms. Please come back for a follow-up appointment in 2 months.

## 2015-02-06 ENCOUNTER — Ambulatory Visit (INDEPENDENT_AMBULATORY_CARE_PROVIDER_SITE_OTHER): Payer: Medicare Other | Admitting: Internal Medicine

## 2015-02-06 ENCOUNTER — Encounter: Payer: Self-pay | Admitting: Internal Medicine

## 2015-02-06 ENCOUNTER — Other Ambulatory Visit (INDEPENDENT_AMBULATORY_CARE_PROVIDER_SITE_OTHER): Payer: Medicare Other

## 2015-02-06 VITALS — BP 110/70 | HR 72 | Temp 97.8°F | Ht 72.0 in | Wt 161.5 lb

## 2015-02-06 DIAGNOSIS — F17210 Nicotine dependence, cigarettes, uncomplicated: Secondary | ICD-10-CM

## 2015-02-06 DIAGNOSIS — I10 Essential (primary) hypertension: Secondary | ICD-10-CM

## 2015-02-06 DIAGNOSIS — B182 Chronic viral hepatitis C: Secondary | ICD-10-CM

## 2015-02-06 DIAGNOSIS — E1043 Type 1 diabetes mellitus with diabetic autonomic (poly)neuropathy: Secondary | ICD-10-CM

## 2015-02-06 DIAGNOSIS — Z720 Tobacco use: Secondary | ICD-10-CM

## 2015-02-06 DIAGNOSIS — Z23 Encounter for immunization: Secondary | ICD-10-CM

## 2015-02-06 DIAGNOSIS — J449 Chronic obstructive pulmonary disease, unspecified: Secondary | ICD-10-CM | POA: Diagnosis not present

## 2015-02-06 DIAGNOSIS — Z0000 Encounter for general adult medical examination without abnormal findings: Secondary | ICD-10-CM

## 2015-02-06 LAB — HEPATIC FUNCTION PANEL
ALBUMIN: 3.4 g/dL — AB (ref 3.5–5.2)
ALK PHOS: 100 U/L (ref 39–117)
ALT: 44 U/L (ref 0–53)
AST: 35 U/L (ref 0–37)
Bilirubin, Direct: 0.1 mg/dL (ref 0.0–0.3)
TOTAL PROTEIN: 6.8 g/dL (ref 6.0–8.3)
Total Bilirubin: 0.3 mg/dL (ref 0.2–1.2)

## 2015-02-06 LAB — MICROALBUMIN / CREATININE URINE RATIO
CREATININE, U: 87 mg/dL
Microalb Creat Ratio: 90.4 mg/g — ABNORMAL HIGH (ref 0.0–30.0)
Microalb, Ur: 78.7 mg/dL — ABNORMAL HIGH (ref 0.0–1.9)

## 2015-02-06 LAB — HEMOGLOBIN A1C: HEMOGLOBIN A1C: 8.2 % — AB (ref 4.6–6.5)

## 2015-02-06 LAB — LIPID PANEL
CHOLESTEROL: 172 mg/dL (ref 0–200)
HDL: 61.3 mg/dL (ref >39.00)
LDL CALC: 100 mg/dL — AB (ref 0–99)
NonHDL: 110.88
TRIGLYCERIDES: 56 mg/dL (ref 0.0–149.0)
Total CHOL/HDL Ratio: 3
VLDL: 11.2 mg/dL (ref 0.0–40.0)

## 2015-02-06 MED ORDER — LOSARTAN POTASSIUM 50 MG PO TABS: 50.0000 mg | ORAL_TABLET | Freq: Every day | ORAL | Status: DC

## 2015-02-06 NOTE — Assessment & Plan Note (Signed)
Therapy historically limited by compliance 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 - laser x 2, no $ for optho follow up at this time Follows with endo ([facility]), continue same recommendations Because of neuropathy, previously rx'd amitriptyline and gabapentin qhs - but not taking due to $  Lab Results  Component Value Date   HGBA1C 9.6 11/30/2014

## 2015-02-06 NOTE — Patient Instructions (Addendum)
It was good to see you today.  We have reviewed your prior records including labs and tests today  Health Maintenance reviewed - all recommended immunizations and age-appropriate screenings are up-to-date.  Test(s) ordered today. Your results will be released to [place] (or called to you) after review, usually within 72hours after test completion. If any changes need to be made, you will be notified at that same time.  Medications reviewed and updated, no changes recommended at this time.  Followup with Dr. Loanne Drilling as planned  we'll make referral to eye specialist. Our office will contact you regarding appointment(s) once made.  Please schedule followup in 6 months for semiannual exam and labs, call sooner if problems.

## 2015-02-06 NOTE — Addendum Note (Signed)
Addended by: Lowella Dandy on: 02/06/2015 11:04 AM   Modules accepted: Orders, SmartSet

## 2015-02-06 NOTE — Progress Notes (Signed)
Pre visit review using our clinic review tool, if applicable. No additional management support is needed unless otherwise documented below in the visit note. 

## 2015-02-06 NOTE — Assessment & Plan Note (Signed)
started ARB 02/2011 after reviewing indications for "renal protection from DM" Stopped taking because of homelessness and cost - agrees to restart now - erx done  BP Readings from Last 3 Encounters:  02/06/15 110/70  01/12/15 128/87  11/30/14 138/60

## 2015-02-06 NOTE — Progress Notes (Signed)
Subjective:    Patient ID: NAJAI NIE, male    DOB: 1955-12-26, 59 y.o.   MRN: [id number]  HPI   Here for medicare wellness  Diet: heart healthy, diabetic Physical activity: sedentary Depression/mood screen: negative Hearing: intact to whispered voice Visual acuity: grossly normal, performs annual eye exam  ADLs: capable Fall risk: reviewed Home safety: reviewed Cognitive evaluation: intact to orientation, naming, recall and repetition EOL planning: adv directives, full code/ I agree  I have personally reviewed and have noted 1. The patient's medical and social history 2. Their use of alcohol, tobacco or illicit drugs 3. Their current medications and supplements 4. The patient's functional ability including ADL's, fall risks, home safety risks and hearing or visual impairment. 5. Diet and physical activities 6. Evidence for depression or mood disorders  Also reviewed chronic medical conditions, interval events and current concerns   Past Medical History  Diagnosis Date  . Glaucoma   . HEPATITIS C     chronic  . DIABETES MELLITUS, TYPE I   . DRUG ABUSE     pt should have NO controlled substances rx'ed  . Vitiligo   . 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 DEPRESSION   . Hypertension 02/18/2011   Family History  Problem Relation Age of Onset  . Arthritis Mother   . Heart disease Mother     CAD  . Diabetes Father   . Kidney disease Father    Social History  Substance Use Topics  . Smoking status: Current Every Day Smoker -- 2.00 packs/day    Types: Cigarettes  . Smokeless tobacco: None  . Alcohol Use: No    Review of Systems  Constitutional: Negative for fever, activity change, appetite change, fatigue and unexpected weight change.  Respiratory: Negative for cough, chest tightness, shortness of breath and wheezing.   Cardiovascular: Negative for chest pain, palpitations and leg swelling.  Gastrointestinal: Negative for nausea and diarrhea.    Neurological: Negative for dizziness, weakness and headaches.  Psychiatric/Behavioral: Negative for dysphoric mood. The patient is not nervous/anxious.   All other systems reviewed and are negative.   Patient Care Team: Rowe Clack, MD as PCP - General Renato Shin, MD (Endocrinology) Monna Fam, MD (Ophthalmology)     Objective:    Physical Exam  Constitutional: He appears well-developed and well-nourished. No distress.  Cardiovascular: Normal rate, regular rhythm and normal heart sounds.   No murmur heard. Pulmonary/Chest: Effort normal and breath sounds normal. No respiratory distress. He has no wheezes.  Skin:  Vitiligo changes diffuse, unchanged    BP 110/70 mmHg  Pulse 72  Temp(Src) 97.8 F (36.6 C) (Oral)  Ht 6' (1.829 m)  Wt 161 lb 8 oz (73.256 kg)  BMI 21.90 kg/m2  SpO2 98% Wt Readings from Last 3 Encounters:  02/06/15 161 lb 8 oz (73.256 kg)  01/12/15 151 lb (68.493 kg)  11/30/14 155 lb (70.308 kg)     Lab Results  Component Value Date   WBC 10.1 12/08/2013   HGB 14.8 12/08/2013   HCT 44.4 12/08/2013   PLT 308.0 12/08/2013   GLUCOSE 416* 11/30/2014   CHOL 184 11/02/2013   TRIG 138.0 11/02/2013   HDL 73.40 11/02/2013   LDLCALC 83 11/02/2013   ALT 72* 11/30/2014   AST 60* 11/30/2014   NA 132* 11/30/2014   K 4.7 11/30/2014   CL 98 11/30/2014   CREATININE 0.86 11/30/2014   BUN 19 11/30/2014   CO2 30 11/30/2014   TSH 0.71 02/18/2011  PSA 0.36 11/19/2006   HGBA1C 9.6 11/30/2014   MICROALBUR 128.2* 11/02/2013    No results found.     Assessment & Plan:   AWV/z00.00 - Today patient counseled on age appropriate routine health concerns for screening and prevention, each reviewed and up to date or declined. Immunizations reviewed and up to date or declined. Labs ordered and reviewed. Risk factors for depression reviewed and negative. Hearing function and visual acuity are intact. ADLs screened and addressed as needed. Functional ability  and level of safety reviewed and appropriate. Education, counseling and referrals performed based on assessed risks today. Patient provided with a copy of personalized plan for preventive services.  Problem List Items Addressed This Visit    Chronic hepatitis C ([place])    Prior evaluation 2007 by hepatologist for same - never underwent treatment to to ongoing drug abuse at the time Declined IFN due to depression hx and prior failure UDS, declines oral tx because of cost Recheck LFTs now to monitor same      Relevant Orders   Hepatic function panel   COPD, mild ([place])    No recent flares or need for Albuterol MDI 5 minutes today spent counseling patient on unhealthy effects of continued tobacco abuse and encouragement of cessation including medical options available to help the patient quit smoking.      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

## 2015-02-06 NOTE — Assessment & Plan Note (Signed)
No recent flares or need for Albuterol MDI 5 minutes today spent counseling patient on unhealthy effects of continued tobacco abuse and encouragement of cessation including medical options available to help the patient quit smoking.

## 2015-02-06 NOTE — Assessment & Plan Note (Signed)
Prior evaluation 2007 by hepatologist for same - never underwent treatment to to ongoing drug abuse at the time Declined IFN due to depression hx and prior failure UDS, declines oral tx because of cost Recheck LFTs now to monitor same

## 2015-02-09 ENCOUNTER — Emergency Department (HOSPITAL_COMMUNITY): Payer: Medicare Other

## 2015-02-09 ENCOUNTER — Emergency Department (HOSPITAL_COMMUNITY)
Admission: EM | Admit: 2015-02-09 | Discharge: 2015-02-09 | Disposition: A | Discharge status: Home / Self Care | Payer: Medicare Other | Attending: Emergency Medicine | Admitting: Emergency Medicine

## 2015-02-09 DIAGNOSIS — I10 Essential (primary) hypertension: Secondary | ICD-10-CM | POA: Insufficient documentation

## 2015-02-09 DIAGNOSIS — R410 Disorientation, unspecified: Secondary | ICD-10-CM | POA: Diagnosis not present

## 2015-02-09 DIAGNOSIS — Z8659 Personal history of other mental and behavioral disorders: Secondary | ICD-10-CM | POA: Insufficient documentation

## 2015-02-09 DIAGNOSIS — Z872 Personal history of diseases of the skin and subcutaneous tissue: Secondary | ICD-10-CM | POA: Diagnosis not present

## 2015-02-09 DIAGNOSIS — Z794 Long term (current) use of insulin: Secondary | ICD-10-CM | POA: Insufficient documentation

## 2015-02-09 DIAGNOSIS — E10649 Type 1 diabetes mellitus with hypoglycemia without coma: Secondary | ICD-10-CM | POA: Insufficient documentation

## 2015-02-09 DIAGNOSIS — Z720 Tobacco use: Secondary | ICD-10-CM | POA: Insufficient documentation

## 2015-02-09 DIAGNOSIS — E103599 Type 1 diabetes mellitus with proliferative diabetic retinopathy without macular edema, unspecified eye: Secondary | ICD-10-CM | POA: Insufficient documentation

## 2015-02-09 DIAGNOSIS — Z8619 Personal history of other infectious and parasitic diseases: Secondary | ICD-10-CM | POA: Insufficient documentation

## 2015-02-09 DIAGNOSIS — E162 Hypoglycemia, unspecified: Secondary | ICD-10-CM

## 2015-02-09 LAB — CBC WITH DIFFERENTIAL/PLATELET
BASOS PCT: 0 %
Basophils Absolute: 0 10*3/uL (ref 0.0–0.1)
EOS ABS: 0.1 10*3/uL (ref 0.0–0.7)
EOS PCT: 1 %
HCT: 45.6 % (ref 39.0–52.0)
HEMOGLOBIN: 15.7 g/dL (ref 13.0–17.0)
LYMPHS ABS: 1.6 10*3/uL (ref 0.7–4.0)
Lymphocytes Relative: 12 %
MCH: 31.9 pg (ref 26.0–34.0)
MCHC: 34.4 g/dL (ref 30.0–36.0)
MCV: 92.7 fL (ref 78.0–100.0)
MONOS PCT: 7 %
Monocytes Absolute: 0.9 10*3/uL (ref 0.1–1.0)
NEUTROS PCT: 80 %
Neutro Abs: 10.8 10*3/uL — ABNORMAL HIGH (ref 1.7–7.7)
PLATELETS: 297 10*3/uL (ref 150–400)
RBC: 4.92 MIL/uL (ref 4.22–5.81)
RDW: 13.4 % (ref 11.5–15.5)
WBC: 13.5 10*3/uL — AB (ref 4.0–10.5)

## 2015-02-09 LAB — CBG MONITORING, ED
GLUCOSE-CAPILLARY: 117 mg/dL — AB (ref 65–99)
GLUCOSE-CAPILLARY: 131 mg/dL — AB (ref 65–99)
Glucose-Capillary: 131 mg/dL — ABNORMAL HIGH (ref 65–99)
Glucose-Capillary: 23 mg/dL — CL (ref 65–99)

## 2015-02-09 LAB — COMPREHENSIVE METABOLIC PANEL
ALBUMIN: 4 g/dL (ref 3.5–5.0)
ALK PHOS: 106 U/L (ref 38–126)
ALT: 58 U/L (ref 17–63)
ANION GAP: 9 (ref 5–15)
AST: 46 U/L — ABNORMAL HIGH (ref 15–41)
BUN: 15 mg/dL (ref 6–20)
CALCIUM: 9.3 mg/dL (ref 8.9–10.3)
CHLORIDE: 103 mmol/L (ref 101–111)
CO2: 28 mmol/L (ref 22–32)
Creatinine, Ser: 0.83 mg/dL (ref 0.61–1.24)
GFR calc non Af Amer: >60 mL/min (ref >60)
GLUCOSE: 121 mg/dL — AB (ref 65–99)
POTASSIUM: 3.5 mmol/L (ref 3.5–5.1)
Sodium: 140 mmol/L (ref 135–145)
Total Bilirubin: 0.8 mg/dL (ref 0.3–1.2)
Total Protein: 7.9 g/dL (ref 6.5–8.1)

## 2015-02-09 LAB — TROPONIN I: TROPONIN I: 0.04 ng/mL — AB (ref <0.031)

## 2015-02-09 MED ORDER — DEXTROSE 50 % IV SOLN
50.0000 mL | Freq: Once | INTRAVENOUS | Status: AC
  Administered 2015-02-09: 50 mL via INTRAVENOUS

## 2015-02-09 NOTE — ED Notes (Signed)
Patient given orange juice and sandwich. ?

## 2015-02-09 NOTE — ED Provider Notes (Signed)
CSN: [id number]     Arrival date & time 02/09/15  1733 History   First MD Initiated Contact with Patient 02/09/15 1748     Chief Complaint  Patient presents with  . hypoglycemia      (Consider location/radiation/quality/duration/timing/severity/associated sxs/prior Treatment) Patient is a 59 y.o. male presenting with altered mental status.  Altered Mental Status Presenting symptoms: behavior changes, confusion and disorientation   Presenting symptoms: no combativeness and no partial responsiveness   Severity:  Mild Most recent episode:  Today Episode history:  Single Timing:  Constant Progression:  Unchanged Chronicity:  New Context: not alcohol use and not dementia   Associated symptoms: no abdominal pain, no fever, no nausea, no palpitations and no vomiting     Past Medical History  Diagnosis Date  . Glaucoma   . HEPATITIS C     chronic  . DIABETES MELLITUS, TYPE I   . DRUG ABUSE     pt should have NO controlled substances rx'ed  . Vitiligo   . 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 DEPRESSION   . Hypertension 02/18/2011   Past Surgical History  Procedure Laterality Date  . Eye surgery      retinal surgery x 2, right eye   Family History  Problem Relation Age of Onset  . Arthritis Mother   . Heart disease Mother     CAD  . Diabetes Father   . Kidney disease Father    Social History  Substance Use Topics  . Smoking status: Current Every Day Smoker -- 2.00 packs/day    Types: Cigarettes  . Smokeless tobacco: Not on file  . Alcohol Use: No    Review of Systems  Constitutional: Negative for fever and fatigue.  HENT: Negative for congestion.   Eyes: Negative for photophobia and pain.  Respiratory: Negative for cough and shortness of breath.   Cardiovascular: Negative for chest pain and palpitations.  Gastrointestinal: Negative for nausea, vomiting, abdominal pain and diarrhea.  Musculoskeletal: Negative for back pain and neck pain.  Skin:  Negative for pallor.  Psychiatric/Behavioral: Positive for confusion.  All other systems reviewed and are negative.     Allergies  Codeine  Home Medications   Prior to Admission medications   Medication Sig Start Date End Date Taking? Authorizing Provider  Insulin Detemir (LEVEMIR FLEXTOUCH) 100 UNIT/ML Pen Inject 55 Units into the skin every morning. And pen needles 1/day 11/30/14  Yes Renato Shin, MD  B-D ULTRAFINE III SHORT PEN 31G X 8 MM MISC USE AS DIRECTED 02/06/12   Renato Shin, MD  losartan (COZAAR) 50 MG tablet Take 1 tablet (50 mg total) by mouth daily. Patient not taking: Reported on 02/09/2015 02/06/15   Rowe Clack, MD   BP 153/55 mmHg  Pulse 89  Temp(Src) 97.5 F (36.4 C) (Oral)  Resp 16  SpO2 99% Physical Exam  Constitutional: He is oriented to person, place, and time. He appears well-developed and well-nourished.  HENT:  Head: Normocephalic and atraumatic.  Eyes: Conjunctivae and EOM are normal.  Neck: Normal range of motion. Neck supple.  Cardiovascular: Normal rate and regular rhythm.   Pulmonary/Chest: Effort normal. No respiratory distress.  Abdominal: Soft. There is no tenderness.  Musculoskeletal: Normal range of motion. He exhibits no edema or tenderness.  Neurological: He is alert and oriented to person, place, and time.  Skin: Skin is warm and dry.  Nursing note and vitals reviewed.   ED Course  Procedures (including critical care time) Labs Review Labs Reviewed  CBC WITH DIFFERENTIAL/PLATELET - Abnormal; Notable for the following:    WBC 13.5 (*)    Neutro Abs 10.8 (*)    All other components within normal limits  COMPREHENSIVE METABOLIC PANEL - Abnormal; Notable for the following:    Glucose, Bld 121 (*)    AST 46 (*)    All other components within normal limits  TROPONIN I - Abnormal; Notable for the following:    Troponin I 0.04 (*)    All other components within normal limits  CBG MONITORING, ED - Abnormal; Notable for the  following:    Glucose-Capillary 23 (*)    All other components within normal limits  CBG MONITORING, ED - Abnormal; Notable for the following:    Glucose-Capillary 131 (*)    All other components within normal limits  CBG MONITORING, ED - Abnormal; Notable for the following:    Glucose-Capillary 117 (*)    All other components within normal limits  CBG MONITORING, ED - Abnormal; Notable for the following:    Glucose-Capillary 131 (*)    All other components within normal limits    Imaging Review Dg Chest Portable 1 View  02/09/2015  CLINICAL DATA:  Pt c/o n/v, weakness, syncope today, pt reports fluctuating blood sugar today EXAM: PORTABLE CHEST 1 VIEW COMPARISON:  10/23/2011 FINDINGS: Heart is normal in size. There is prominence of interstitial markings and mild perihilar peribronchial thickening. No focal consolidations or pleural effusions are identified. Visualized osseous structures have a normal appearance. IMPRESSION: Prominent interstitial markings and bronchitic changes. No focal acute pulmonary abnormality. Electronically Signed   By: Nolon Nations M.D.   On: 02/09/2015 18:22   I have personally reviewed and evaluated these images and lab results as part of my medical decision-making.   EKG Interpretation   Date/Time:  Thursday February 09 2015 17:53:09 EDT Ventricular Rate:  79 PR Interval:  153 QRS Duration: 93 QT Interval:  397 QTC Calculation: 455 R Axis:   76 Text Interpretation:  Sinus rhythm Confirmed by Thursa Emme MD, Corene Cornea [phone])  on 02/09/2015 5:58:23 PM      MDM   Final diagnoses:  Hypoglycemia   59 year old male with a history of diabetes here with hypoglycemia of unknown origin. Patient states that he had an episode of confusion also with lower chest pain and worsening confusion so his friends brought him here for evaluation. Initially he had a blank stare on his face and wasn't responding well to questions his blood sugar was 23 patient was given D50  and something to eat and his mental status improved and he was symptom-free. Unsure of the cause of his hypoglycemia however he was normal for approximately 2-1/2 hours while in the emergency department. Secondary to the chest pain and did check was not resulted at time of discharge. Patient was adamant that he wanted to leave. Afterwards the troponin came back mildly elevated. Attempted to contact patient to come back for evaluation but was not able to so I left a voicemail at his mobile number [phone]) stating he had an elevated heart enzyme and needed to come back for reevaluation.   I have personally and contemperaneously reviewed labs and imaging and used in my decision making as above.   A medical screening exam was performed and I feel the patient has had an appropriate workup for their chief complaint at this time and likelihood of emergent condition existing is low. They have been counseled on decision, discharge, follow up and which symptoms necessitate immediate return  to the emergency department. They or their family verbally stated understanding and agreement with plan and discharged in stable condition.      Merrily Pew, MD 02/10/15 0010

## 2015-02-09 NOTE — ED Notes (Signed)
Pt has hx of Hep C (refused all treatment), drug abuse, and DM. Pt friend states pt started having difficulty walking, pt fell onto couch, and started having chest pain, took a while to get pt to car. Upon rn assessment pt letheragic, slow to answer questions, face red.  CBG 23 when checked in triage.

## 2015-02-10 ENCOUNTER — Telehealth: Payer: Self-pay | Admitting: *Deleted

## 2015-02-10 NOTE — ED Notes (Signed)
Contacted by patient stating that his son received a phone call stating that patient should return to the hospital immediately for treatment.  Advised patient that I can find no documentation stating that the patient has been contacted by this health system requesting he return for treatment.

## 2015-02-13 ENCOUNTER — Telehealth: Payer: Self-pay | Admitting: Internal Medicine

## 2015-02-13 NOTE — Telephone Encounter (Signed)
Please follow up with as soon as possible on labs

## 2015-02-14 NOTE — Telephone Encounter (Signed)
appt set for 03/06/2015 @ 9:15 per [name]

## 2015-02-14 NOTE — Telephone Encounter (Signed)
Pt saw Dr. Asa Lente on 02/06/15 forwarding msg to [place]...[name]

## 2015-02-14 NOTE — Telephone Encounter (Addendum)
Patient called stating he is very worried about his lab results from [place] and the hospital. He would like someone to review them and call him back ASAP. He states he was in the ED and after he left he was asked to come back, but couldn't due to transportation. Patient states he is "waiving his rights" and we may speak with his roommate Tim and give him the results. CB# [phone]

## 2015-02-16 ENCOUNTER — Other Ambulatory Visit: Payer: Self-pay

## 2015-02-16 ENCOUNTER — Encounter (HOSPITAL_COMMUNITY): Payer: Self-pay | Admitting: Emergency Medicine

## 2015-02-16 ENCOUNTER — Emergency Department (HOSPITAL_COMMUNITY)
Admission: EM | Admit: 2015-02-16 | Discharge: 2015-02-16 | Disposition: A | Discharge status: Home / Self Care | Payer: Medicare Other | Attending: Emergency Medicine | Admitting: Emergency Medicine

## 2015-02-16 DIAGNOSIS — I10 Essential (primary) hypertension: Secondary | ICD-10-CM | POA: Insufficient documentation

## 2015-02-16 DIAGNOSIS — Z720 Tobacco use: Secondary | ICD-10-CM | POA: Diagnosis not present

## 2015-02-16 DIAGNOSIS — Z872 Personal history of diseases of the skin and subcutaneous tissue: Secondary | ICD-10-CM | POA: Insufficient documentation

## 2015-02-16 DIAGNOSIS — R0789 Other chest pain: Secondary | ICD-10-CM | POA: Insufficient documentation

## 2015-02-16 DIAGNOSIS — R7989 Other specified abnormal findings of blood chemistry: Secondary | ICD-10-CM | POA: Diagnosis not present

## 2015-02-16 DIAGNOSIS — E109 Type 1 diabetes mellitus without complications: Secondary | ICD-10-CM | POA: Insufficient documentation

## 2015-02-16 DIAGNOSIS — R778 Other specified abnormalities of plasma proteins: Secondary | ICD-10-CM

## 2015-02-16 DIAGNOSIS — Z8669 Personal history of other diseases of the nervous system and sense organs: Secondary | ICD-10-CM | POA: Diagnosis not present

## 2015-02-16 DIAGNOSIS — Z8619 Personal history of other infectious and parasitic diseases: Secondary | ICD-10-CM | POA: Insufficient documentation

## 2015-02-16 DIAGNOSIS — Z794 Long term (current) use of insulin: Secondary | ICD-10-CM | POA: Insufficient documentation

## 2015-02-16 DIAGNOSIS — Z8659 Personal history of other mental and behavioral disorders: Secondary | ICD-10-CM | POA: Insufficient documentation

## 2015-02-16 LAB — BASIC METABOLIC PANEL
ANION GAP: 9 (ref 5–15)
BUN: 20 mg/dL (ref 6–20)
CO2: 25 mmol/L (ref 22–32)
CREATININE: 0.96 mg/dL (ref 0.61–1.24)
Calcium: 9.4 mg/dL (ref 8.9–10.3)
Chloride: 102 mmol/L (ref 101–111)
GFR calc Af Amer: >60 mL/min (ref >60)
GLUCOSE: 345 mg/dL — AB (ref 65–99)
Potassium: 4.1 mmol/L (ref 3.5–5.1)
Sodium: 136 mmol/L (ref 135–145)

## 2015-02-16 LAB — CBC WITH DIFFERENTIAL/PLATELET
BASOS ABS: 0.1 10*3/uL (ref 0.0–0.1)
Basophils Relative: 1 %
EOS ABS: 0.2 10*3/uL (ref 0.0–0.7)
EOS PCT: 2 %
HCT: 44.3 % (ref 39.0–52.0)
Hemoglobin: 16 g/dL (ref 13.0–17.0)
LYMPHS PCT: 12 %
Lymphs Abs: 1.1 10*3/uL (ref 0.7–4.0)
MCH: 32.4 pg (ref 26.0–34.0)
MCHC: 36.1 g/dL — ABNORMAL HIGH (ref 30.0–36.0)
MCV: 89.7 fL (ref 78.0–100.0)
MONO ABS: 0.4 10*3/uL (ref 0.1–1.0)
Monocytes Relative: 5 %
Neutro Abs: 7.7 10*3/uL (ref 1.7–7.7)
Neutrophils Relative %: 80 %
PLATELETS: 289 10*3/uL (ref 150–400)
RBC: 4.94 MIL/uL (ref 4.22–5.81)
RDW: 12.8 % (ref 11.5–15.5)
WBC: 9.5 10*3/uL (ref 4.0–10.5)

## 2015-02-16 LAB — TROPONIN I: Troponin I: <0.03 ng/mL (ref <0.031)

## 2015-02-16 NOTE — ED Notes (Signed)
EDPA PRESENT at bedside. 

## 2015-02-16 NOTE — ED Provider Notes (Signed)
CSN: [id number]     Arrival date & time 02/16/15  1352 History   First MD Initiated Contact with Patient 02/16/15 1459     Chief Complaint  Patient presents with  . Labs Only     (Consider location/radiation/quality/duration/timing/severity/associated sxs/prior Treatment) HPI [name] is a 59 y.o. male with hx of hep c, DM type 1, Polysubstance abuse, last used cocaine yesterday, presents to ED because of elevated trop 1 week ago. Patient was seen on 02/09/15 after being found unresponsive in having CBG of 23. Patient had a workup, and Dr. discharge it was discovered that his troponin came back at 0.04. Patient did complain of some chest pain during his last visit. Patient was called by a provider and was told to return to emergency department. Patient stated that he did not want to come back, because "I am too hard headed." He said that he spoke with his primary care doctor who told him to go to emergency department immediately. He states he did not have a ride and call EMS today. He denies any complaints at this time. He continues to use drugs including cocaine and heroin, last use was yesterday. He is a smoker. No history of coronary artery disease. Patient does have family history of coronary disease.  Past Medical History  Diagnosis Date  . Glaucoma   . HEPATITIS C     chronic  . DIABETES MELLITUS, TYPE I   . DRUG ABUSE     pt should have NO controlled substances rx'ed  . Vitiligo   . 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 DEPRESSION   . Hypertension 02/18/2011   Past Surgical History  Procedure Laterality Date  . Eye surgery      retinal surgery x 2, right eye   Family History  Problem Relation Age of Onset  . Arthritis Mother   . Heart disease Mother     CAD  . Diabetes Father   . Kidney disease Father    Social History  Substance Use Topics  . Smoking status: Current Every Day Smoker -- 2.00 packs/day    Types: Cigarettes  . Smokeless tobacco: None  .  Alcohol Use: No    Review of Systems  Constitutional: Negative for fever and chills.  Respiratory: Positive for chest tightness. Negative for cough and shortness of breath.   Cardiovascular: Positive for chest pain. Negative for palpitations and leg swelling.  Gastrointestinal: Negative for nausea, vomiting, abdominal pain, diarrhea and abdominal distention.  Genitourinary: Negative for dysuria, urgency, frequency and hematuria.  Musculoskeletal: Negative for myalgias, arthralgias, neck pain and neck stiffness.  Skin: Negative for rash.  Allergic/Immunologic: Negative for immunocompromised state.  Neurological: Negative for dizziness, weakness, light-headedness, numbness and headaches.  All other systems reviewed and are negative.     Allergies  Codeine  Home Medications   Prior to Admission medications   Medication Sig Start Date End Date Taking? Authorizing Provider  B-D ULTRAFINE III SHORT PEN 31G X 8 MM MISC USE AS DIRECTED 02/06/12   Renato Shin, MD  Insulin Detemir (LEVEMIR FLEXTOUCH) 100 UNIT/ML Pen Inject 55 Units into the skin every morning. And pen needles 1/day 11/30/14   Renato Shin, MD  losartan (COZAAR) 50 MG tablet Take 1 tablet (50 mg total) by mouth daily. Patient not taking: Reported on 02/09/2015 02/06/15   Rowe Clack, MD   BP 150/55 mmHg  Pulse 88  Temp(Src) 98.3 F (36.8 C) (Oral)  Resp 20  SpO2 96% Physical Exam  Constitutional: He is oriented to person, place, and time. He appears well-developed and well-nourished. No distress.  HENT:  Head: Normocephalic and atraumatic.  Eyes: Conjunctivae are normal.  Neck: Neck supple.  Cardiovascular: Normal rate, regular rhythm and normal heart sounds.   Pulmonary/Chest: Effort normal. No respiratory distress. He has no wheezes. He has no rales.  Abdominal: Soft. Bowel sounds are normal. He exhibits no distension. There is no tenderness. There is no rebound.  Musculoskeletal: He exhibits no edema.   Neurological: He is alert and oriented to person, place, and time.  Skin: Skin is warm and dry.  Nursing note and vitals reviewed.   ED Course  Procedures (including critical care time) Labs Review Labs Reviewed  CBC WITH DIFFERENTIAL/PLATELET - Abnormal; Notable for the following:    MCHC 36.1 (*)    All other components within normal limits  BASIC METABOLIC PANEL - Abnormal; Notable for the following:    Glucose, Bld 345 (*)    All other components within normal limits  TROPONIN I    Imaging Review No results found. I have personally reviewed and evaluated these images and lab results as part of my medical decision-making.   EKG Interpretation   Date/Time:  Thursday February 16 2015 14:28:55 EDT Ventricular Rate:  82 PR Interval:  142 QRS Duration: 79 QT Interval:  381 QTC Calculation: 445 R Axis:   -74 Text Interpretation:  Sinus rhythm Biatrial enlargement Left anterior  fascicular block Probable left ventricular hypertrophy Anterior Q waves,  possibly due to LVH Similar to Prior Confirmed by [facility] MD, Corene Cornea [phone])  on 02/16/2015 3:27:16 PM      MDM   Final diagnoses:  Elevated troponin   Pt was called back A WEEK AGO after mildly elevated trop in ED. Pt came back today. He is asymptomatic. Mildly hypertensive, otehrwise normal VS. Will recheck trop, labs. Monitor.   4:52 PM Patient's troponin today is normal. He denies any chest pain at this time. I instructed him to stop using cocaine and heroin. I will have him follow with cardiologist for ongoing chest pains and mildly elevated troponin and a week ago. Patient also has a primary care doctor, he will follow-up with them. Return precautions discussed.   Filed Vitals:   02/16/15 1359  BP: 150/55  Pulse: 88  Temp: 98.3 F (36.8 C)  TempSrc: Oral  Resp: 20  SpO2: 96%      Jeannett Senior, PA-C 02/16/15 1653  Merrily Pew, MD 02/16/15 2335

## 2015-02-16 NOTE — ED Notes (Signed)
Per EMS: Pt was here on 10/27.  Left AMA.  Got a call from Dr. Dayna Barker saying that he needed to come back because his troponin was elevated.  Pt didn't want to come back, then he didn't have a ride.  Admits to crack cocaine and heroin usage.  Crack for 30 years.  Called out for "breathing difficulty".  However, pt denies this when EMS arrived.  12 lead from EMS was unremarkable.

## 2015-02-16 NOTE — ED Notes (Signed)
MD at bedside. 

## 2015-02-16 NOTE — Discharge Instructions (Signed)
Stop using drugs. Follow up with cardiology regarding your mildly elevated heart enzyme 1 week ago and for on going chest pains. Return if worsening symptoms.   Nonspecific Chest Pain  Chest pain can be caused by many different conditions. There is always a chance that your pain could be related to something serious, such as a heart attack or a blood clot in your lungs. Chest pain can also be caused by conditions that are not life-threatening. If you have chest pain, it is very important to follow up with your health care provider. CAUSES  Chest pain can be caused by:  Heartburn.  Pneumonia or bronchitis.  Anxiety or stress.  Inflammation around your heart (pericarditis) or lung (pleuritis or pleurisy).  A blood clot in your lung.  A collapsed lung (pneumothorax). It can develop suddenly on its own (spontaneous pneumothorax) or from trauma to the chest.  Shingles infection (varicella-zoster virus).  Heart attack.  Damage to the bones, muscles, and cartilage that make up your chest [place]. This can include:  Bruised bones due to injury.  Strained muscles or cartilage due to frequent or repeated coughing or overwork.  Fracture to one or more ribs.  Sore cartilage due to inflammation (costochondritis). RISK FACTORS  Risk factors for chest pain may include:  Activities that increase your risk for trauma or injury to your chest.  Respiratory infections or conditions that cause frequent coughing.  Medical conditions or overeating that can cause heartburn.  Heart disease or family history of heart disease.  Conditions or health behaviors that increase your risk of developing a blood clot.  Having had chicken pox (varicella zoster). SIGNS AND SYMPTOMS Chest pain can feel like:  Burning or tingling on the surface of your chest or deep in your chest.  Crushing, pressure, aching, or squeezing pain.  Dull or sharp pain that is worse when you move, cough, or take a deep  breath.  Pain that is also felt in your back, neck, shoulder, or arm, or pain that spreads to any of these areas. Your chest pain may come and go, or it may stay constant. DIAGNOSIS Lab tests or other studies may be needed to find the cause of your pain. Your health care provider may have you take a test called an ambulatory ECG (electrocardiogram). An ECG records your heartbeat patterns at the time the test is performed. You may also have other tests, such as:  Transthoracic echocardiogram (TTE). During echocardiography, sound waves are used to create a picture of all of the heart structures and to look at how blood flows through your heart.  Transesophageal echocardiogram (TEE).This is a more advanced imaging test that obtains images from inside your body. It allows your health care provider to see your heart in finer detail.  Cardiac monitoring. This allows your health care provider to monitor your heart rate and rhythm in real time.  Holter monitor. This is a portable device that records your heartbeat and can help to diagnose abnormal heartbeats. It allows your health care provider to track your heart activity for several days, if needed.  Stress tests. These can be done through exercise or by taking medicine that makes your heart beat more quickly.  Blood tests.  Imaging tests. TREATMENT  Your treatment depends on what is causing your chest pain. Treatment may include:  Medicines. These may include:  Acid blockers for heartburn.  Anti-inflammatory medicine.  Pain medicine for inflammatory conditions.  Antibiotic medicine, if an infection is present.  Medicines to  dissolve blood clots.  Medicines to treat coronary artery disease.  Supportive care for conditions that do not require medicines. This may include:  Resting.  Applying heat or cold packs to injured areas.  Limiting activities until pain decreases. HOME CARE INSTRUCTIONS  If you were prescribed an  antibiotic medicine, finish it all even if you start to feel better.  Avoid any activities that bring on chest pain.  Do not use any tobacco products, including cigarettes, chewing tobacco, or electronic cigarettes. If you need help quitting, ask your health care provider.  Do not drink alcohol.  Take medicines only as directed by your health care provider.  Keep all follow-up visits as directed by your health care provider. This is important. This includes any further testing if your chest pain does not go away.  If heartburn is the cause for your chest pain, you may be told to keep your head raised (elevated) while sleeping. This reduces the chance that acid will go from your stomach into your esophagus.  Make lifestyle changes as directed by your health care provider. These may include:  Getting regular exercise. Ask your health care provider to suggest some activities that are safe for you.  Eating a heart-healthy diet. A registered dietitian can help you to learn healthy eating options.  Maintaining a healthy weight.  Managing diabetes, if necessary.  Reducing stress. SEEK MEDICAL CARE IF:  Your chest pain does not go away after treatment.  You have a rash with blisters on your chest.  You have a fever. SEEK IMMEDIATE MEDICAL CARE IF:   Your chest pain is worse.  You have an increasing cough, or you cough up blood.  You have severe abdominal pain.  You have severe weakness.  You faint.  You have chills.  You have sudden, unexplained chest discomfort.  You have sudden, unexplained discomfort in your arms, back, neck, or jaw.  You have shortness of breath at any time.  You suddenly start to sweat, or your skin gets clammy.  You feel nauseous or you vomit.  You suddenly feel light-headed or dizzy.  Your heart begins to beat quickly, or it feels like it is skipping beats. These symptoms may represent a serious problem that is an emergency. Do not wait to  see if the symptoms will go away. Get medical help right away. Call your local emergency services (911 in the [place] Do not drive yourself to the hospital.   This information is not intended to replace advice given to you by your health care provider. Make sure you discuss any questions you have with your health care provider.   Document Released: 01/09/2005 Document Revised: 04/22/2014 Document Reviewed: 11/05/2013 Elsevier Interactive Patient Education [employer].

## 2015-03-03 ENCOUNTER — Ambulatory Visit: Payer: Medicare Other | Admitting: Endocrinology

## 2015-03-06 ENCOUNTER — Encounter: Payer: Self-pay | Admitting: Internal Medicine

## 2015-03-06 ENCOUNTER — Ambulatory Visit (INDEPENDENT_AMBULATORY_CARE_PROVIDER_SITE_OTHER): Payer: Medicare Other | Admitting: Internal Medicine

## 2015-03-06 ENCOUNTER — Other Ambulatory Visit (INDEPENDENT_AMBULATORY_CARE_PROVIDER_SITE_OTHER): Payer: Medicare Other

## 2015-03-06 VITALS — BP 142/68 | HR 77 | Temp 97.5°F | Ht 72.0 in | Wt 167.0 lb

## 2015-03-06 DIAGNOSIS — R778 Other specified abnormalities of plasma proteins: Secondary | ICD-10-CM

## 2015-03-06 DIAGNOSIS — IMO0002 Reserved for concepts with insufficient information to code with codable children: Secondary | ICD-10-CM

## 2015-03-06 DIAGNOSIS — N183 Chronic kidney disease, stage 3 (moderate): Secondary | ICD-10-CM | POA: Diagnosis not present

## 2015-03-06 DIAGNOSIS — R7989 Other specified abnormal findings of blood chemistry: Secondary | ICD-10-CM

## 2015-03-06 DIAGNOSIS — R0789 Other chest pain: Secondary | ICD-10-CM

## 2015-03-06 DIAGNOSIS — E1022 Type 1 diabetes mellitus with diabetic chronic kidney disease: Secondary | ICD-10-CM | POA: Diagnosis not present

## 2015-03-06 DIAGNOSIS — E1065 Type 1 diabetes mellitus with hyperglycemia: Secondary | ICD-10-CM

## 2015-03-06 LAB — BASIC METABOLIC PANEL
BUN: 10 mg/dL (ref 6–23)
CHLORIDE: 104 meq/L (ref 96–112)
CO2: 30 meq/L (ref 19–32)
Calcium: 9.6 mg/dL (ref 8.4–10.5)
Creatinine, Ser: 0.78 mg/dL (ref 0.40–1.50)
GFR: 108.5 mL/min (ref >60.00)
GLUCOSE: 84 mg/dL (ref 70–99)
POTASSIUM: 4.2 meq/L (ref 3.5–5.1)
SODIUM: 140 meq/L (ref 135–145)

## 2015-03-06 LAB — TROPONIN I: TNIDX: 0.01 ug/l (ref 0.00–0.06)

## 2015-03-06 NOTE — Progress Notes (Signed)
Pre visit review using our clinic review tool, if applicable. No additional management support is needed unless otherwise documented below in the visit note. 

## 2015-03-06 NOTE — Patient Instructions (Signed)
It was good to see you today.  We have reviewed your prior records including labs and tests today  Test(s) ordered today. Your results will be released to [place] (or called to you) after review, usually within 72hours after test completion. If any changes need to be made, you will be notified at that same time.  Medications reviewed and updated, no changes recommended at this time. Refill on medication(s) as discussed today.  we'll make referral to cardiologist for nuclear stress test . Our office will contact you regarding appointment(s) once made.  Please keep scheduled followup in 3-4 months, call sooner if problems.

## 2015-03-06 NOTE — Progress Notes (Signed)
Subjective:    Patient ID: [name], male    DOB: 03-19-1956, 59 y.o.   MRN: [id number]  HPI  Patient here for ED follow-up. Seen November 3 for chest pain, mild abnormality troponin I noted other labs unremarkable. Patient denies recurrent chest pain since but would like repeat of labs and referral for cardiac stress test  Past Medical History  Diagnosis Date  . Glaucoma   . HEPATITIS C     chronic  . DIABETES MELLITUS, TYPE I   . DRUG ABUSE     pt should have NO controlled substances rx'ed  . Vitiligo   . 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 DEPRESSION   . Hypertension 02/18/2011    Review of Systems  Constitutional: Negative for fatigue and unexpected weight change.  Respiratory: Negative for cough and shortness of breath.   Cardiovascular: Negative for chest pain and leg swelling.       Objective:    Physical Exam  Constitutional: He appears well-developed and well-nourished. No distress.  Cardiovascular: Normal rate, regular rhythm and normal heart sounds.   No murmur heard. Pulmonary/Chest: Effort normal and breath sounds normal. No respiratory distress. He has no wheezes.  Skin:  Vitiligo changes diffuse, unchanged    BP 142/68 mmHg  Pulse 77  Temp(Src) 97.5 F (36.4 C) (Oral)  Ht 6' (1.829 m)  Wt 167 lb (75.751 kg)  BMI 22.64 kg/m2  SpO2 97% Wt Readings from Last 3 Encounters:  03/06/15 167 lb (75.751 kg)  02/06/15 161 lb 8 oz (73.256 kg)  01/12/15 151 lb (68.493 kg)     Lab Results  Component Value Date   WBC 9.5 02/16/2015   HGB 16.0 02/16/2015   HCT 44.3 02/16/2015   PLT 289 02/16/2015   GLUCOSE 345* 02/16/2015   CHOL 172 02/06/2015   TRIG 56.0 02/06/2015   HDL 61.30 02/06/2015   LDLCALC 100* 02/06/2015   ALT 58 02/09/2015   AST 46* 02/09/2015   NA 136 02/16/2015   K 4.1 02/16/2015   CL 102 02/16/2015   CREATININE 0.96 02/16/2015   BUN 20 02/16/2015   CO2 25 02/16/2015   TSH 0.71 02/18/2011   PSA 0.36 11/19/2006   HGBA1C 8.2* 02/06/2015   MICROALBUR 78.7* 02/06/2015    No results found.     Assessment & Plan:   Atypical chest pain. Resolved. Evaluation Oct 27 for hypoglycemia and again November 3 for recheck in the emergency room reviewed. 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 Jerline Pain this week Wednesday, November 23 which patient intends to keep Episode of severe hypoglycemia late October reviewed, denies recurrent since then Follows with endo ([facility]), continue same recommendations Because of neuropathy, previously rx'd amitriptyline and gabapentin qhs - but not taking due to $  Lab Results  Component Value Date   HGBA1C 8.2* 02/06/2015        Relevant Orders   Troponin I   Basic metabolic panel   Myocardial Perfusion Imaging  Other Visit Diagnoses    Elevated troponin I level    -  Primary    Relevant Orders    Troponin I    Basic metabolic panel    Myocardial Perfusion Imaging    Atypical chest pain        Relevant Orders    Troponin I    Basic metabolic panel    Myocardial Perfusion Imaging        Gwendolyn Grant, MD

## 2015-03-06 NOTE — Assessment & Plan Note (Addendum)
Therapy historically limited by compliance 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 - laser x 2 reports no $ for optho follow up at this time, but scheduled with new ophthalmologist Dr. Jerline Pain this week Wednesday, November 23 which patient intends to keep Episode of severe hypoglycemia late October reviewed, denies recurrent since then Follows with endo ([facility]), continue same recommendations Because of neuropathy, previously rx'd amitriptyline and gabapentin qhs - but not taking due to $  Lab Results  Component Value Date   HGBA1C 8.2* 02/06/2015

## 2015-03-07 ENCOUNTER — Telehealth (HOSPITAL_COMMUNITY): Payer: Self-pay | Admitting: *Deleted

## 2015-03-08 LAB — HM DIABETES EYE EXAM

## 2015-03-14 ENCOUNTER — Ambulatory Visit: Payer: Medicare Other | Admitting: Endocrinology

## 2015-03-15 ENCOUNTER — Telehealth: Payer: Self-pay | Admitting: Endocrinology

## 2015-03-15 NOTE — Telephone Encounter (Signed)
Patient no showed today's appt. Please advise on how to follow up. °A. No follow up necessary. °B. Follow up urgent. Contact patient immediately. °C. Follow up necessary. Contact patient and schedule visit in ___ days. °D. Follow up advised. Contact patient and schedule visit in ____weeks. ° °

## 2015-03-15 NOTE — Telephone Encounter (Signed)
I contacted the pt's son and advised him to have the pt call back and reschedule his appointment.

## 2015-03-15 NOTE — Telephone Encounter (Signed)
Follow up advised. Contact patient and schedule visit in 3 months.

## 2015-03-16 ENCOUNTER — Encounter: Payer: Self-pay | Admitting: Endocrinology

## 2015-04-05 ENCOUNTER — Encounter: Payer: Self-pay | Admitting: Endocrinology

## 2015-04-05 ENCOUNTER — Ambulatory Visit (INDEPENDENT_AMBULATORY_CARE_PROVIDER_SITE_OTHER): Payer: Medicare Other | Admitting: Endocrinology

## 2015-04-05 VITALS — BP 146/64 | HR 85 | Temp 98.1°F | Ht 72.0 in | Wt 153.0 lb

## 2015-04-05 DIAGNOSIS — E1043 Type 1 diabetes mellitus with diabetic autonomic (poly)neuropathy: Secondary | ICD-10-CM

## 2015-04-05 MED ORDER — INSULIN DETEMIR 100 UNIT/ML FLEXPEN: 53.0000 [IU] | PEN_INJECTOR | SUBCUTANEOUS | Status: DC

## 2015-04-05 MED ORDER — INSULIN DETEMIR 100 UNIT/ML FLEXPEN: 55.0000 [IU] | PEN_INJECTOR | SUBCUTANEOUS | Status: DC

## 2015-04-05 NOTE — Patient Instructions (Addendum)
check your blood sugar twice a day.  vary the time of day when you check, between before the 3 meals, and at bedtime.  also check if you have symptoms of your blood sugar being too high or too low.  please keep a record of the readings and bring it to your next appointment here (or you can bring the meter itself).  You can write it on any piece of paper.  please call us sooner if your blood sugar goes below 70, or if you have a lot of readings over 200. Please reduce the levemir to 53 units each morning.  On this type of insulin schedule, you should eat meals on a regular schedule.  If a meal is missed or significantly delayed, your blood sugar could go low.   Please come back for a follow-up appointment in 2 months.

## 2015-04-05 NOTE — Progress Notes (Signed)
Subjective:    Patient ID: [name], male    DOB: 1956/01/23, 59 y.o.   MRN: [id number]  HPI Pt returns for f/u of diabetes mellitus: DM type: 1 Dx'ed: 123XX123 Complications: polyneuropathy, nephropathy and retinopathy.  Therapy: insulin since dx DKA: never Severe hypoglycemia: multiple episodes (last was in 2016).  Pancreatitis: never Other: he has done better with a qd insulin regimen; he was changed from lantus to levemir, due to mild hypoglycemia in the early hours of the morning.  Interval history: he continues to occasionally use heroin, EtOH, and cocaine.  no cbg record, but states cbg's are mildly low approx twice a week.  He had another episode of severe hypoglycemia a few weeks ago.  This usually happens after the evening meal.  Past Medical History  Diagnosis Date  . Glaucoma   . HEPATITIS C     chronic  . DIABETES MELLITUS, TYPE I   . DRUG ABUSE     pt should have NO controlled substances rx'ed  . Vitiligo   . 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 DEPRESSION   . Hypertension 02/18/2011    Past Surgical History  Procedure Laterality Date  . Eye surgery      retinal surgery x 2, right eye    Social History   Social History  . Marital Status: Single    Spouse Name: N/A  . Number of Children: N/A  . Years of Education: N/A   Occupational History  .      disabled   Social History Main Topics  . Smoking status: Current Every Day Smoker -- 2.00 packs/day    Types: Cigarettes  . Smokeless tobacco: Not on file  . Alcohol Use: No  . Drug Use: Yes    Special: "Crack" cocaine     Comment: s/p rehab x 6  . Sexual Activity: Not on file   Other Topics Concern  . Not on file   Social History Narrative   Lives in boarding house/homeless   Ongoing occ drug use-crack   Single, disabled, prev mental health drug counselor.     Current Outpatient Prescriptions on File Prior to Visit  Medication Sig Dispense Refill  . losartan (COZAAR) 50 MG tablet  Take 1 tablet (50 mg total) by mouth daily. (Patient not taking: Reported on 04/05/2015) 90 tablet 3   No current facility-administered medications on file prior to visit.    Allergies  Allergen Reactions  . Codeine Itching    Family History  Problem Relation Age of Onset  . Arthritis Mother   . Heart disease Mother     CAD  . Diabetes Father   . Kidney disease Father     BP 146/64 mmHg  Pulse 85  Temp(Src) 98.1 F (36.7 C) (Oral)  Ht 6' (1.829 m)  Wt 153 lb (69.4 kg)  BMI 20.75 kg/m2  SpO2 96%  Review of Systems He has lost a few lbs.      Objective:   Physical Exam VITAL SIGNS:  See vs page GENERAL: no distress Pulses: dorsalis pedis intact bilat.   MSK: no deformity of the feet CV: no leg edema Skin:  no ulcer on the feet.  normal color and temp on the feet. Neuro: sensation is intact to touch on the feet, but decreased from normal.    Lab Results  Component Value Date   HGBA1C 8.2* 02/06/2015      Assessment & Plan:  DM: Needs increased rx, if it can be  done with a regimen that avoids or minimizes hypoglycemia.  However, he is having severe hypoglycemia.   Drug abuse, ongoing: this severely compromises the rx of DM.   Patient is advised the following: Patient Instructions  check your blood sugar twice a day.  vary the time of day when you check, between before the 3 meals, and at bedtime.  also check if you have symptoms of your blood sugar being too high or too low.  please keep a record of the readings and bring it to your next appointment here (or you can bring the meter itself).  You can write it on any piece of paper.  please call us sooner if your blood sugar goes below 70, or if you have a lot of readings over 200. Please reduce the levemir to 53 units each morning.  On this type of insulin schedule, you should eat meals on a regular schedule.  If a meal is missed or significantly delayed, your blood sugar could go low.   Please come back for a  follow-up appointment in 2 months.

## 2015-06-06 ENCOUNTER — Ambulatory Visit: Payer: Medicare Other | Admitting: Endocrinology

## 2015-06-06 ENCOUNTER — Ambulatory Visit: Payer: Medicare Other | Admitting: Internal Medicine

## 2015-06-07 ENCOUNTER — Telehealth: Payer: Self-pay | Admitting: Endocrinology

## 2015-06-07 NOTE — Telephone Encounter (Signed)
Please come back for a follow-up appointment in 2 weeks

## 2015-06-07 NOTE — Telephone Encounter (Signed)
Patient no showed today's appt. Please advise on how to follow up. °A. No follow up necessary. °B. Follow up urgent. Contact patient immediately. °C. Follow up necessary. Contact patient and schedule visit in ___ days. °D. Follow up advised. Contact patient and schedule visit in ____weeks. ° °

## 2015-06-07 NOTE — Telephone Encounter (Signed)
Caitlin, Could you please contact the pt and reschedule his appointment for 2 weeks. Thanks!

## 2015-06-26 NOTE — Progress Notes (Signed)
   Subjective:    Patient ID: [name], male    DOB: 22-May-1955, 60 y.o.   MRN: [id number]  HPI Pt returns for f/u of diabetes mellitus: DM type: 1 Dx'ed: 123XX123 Complications: polyneuropathy, nephropathy and retinopathy.  Therapy: insulin since dx DKA: never Severe hypoglycemia: multiple episodes (last was in 2016).  Pancreatitis: never Other: he has done better with a qd insulin regimen; he was changed from lantus to levemir, due to mild hypoglycemia in the early hours of the morning.  Interval history: he continues to occasionally use heroin, EtOH, and cocaine.  no cbg record, but states cbg's are mildly low approx twice a week.  He had another episode of severe hypoglycemia a few weeks ago.  This usually happens after the evening meal.   Review of Systems     Objective:   Physical Exam        Assessment & Plan:   This encounter was created in error - please disregard.

## 2015-06-26 NOTE — Patient Instructions (Signed)
check your blood sugar twice a day.  vary the time of day when you check, between before the 3 meals, and at bedtime.  also check if you have symptoms of your blood sugar being too high or too low.  please keep a record of the readings and bring it to your next appointment here (or you can bring the meter itself).  You can write it on any piece of paper.  please call us sooner if your blood sugar goes below 70, or if you have a lot of readings over 200. Please reduce the levemir to 53 units each morning.  On this type of insulin schedule, you should eat meals on a regular schedule.  If a meal is missed or significantly delayed, your blood sugar could go low.   Please come back for a follow-up appointment in 2 months.

## 2015-06-27 ENCOUNTER — Encounter: Payer: Medicare Other | Admitting: Endocrinology

## 2015-06-28 ENCOUNTER — Telehealth: Payer: Self-pay | Admitting: Endocrinology

## 2015-06-28 NOTE — Telephone Encounter (Signed)
Patient no showed today's appt. Please advise on how to follow up. °A. No follow up necessary. °B. Follow up urgent. Contact patient immediately. °C. Follow up necessary. Contact patient and schedule visit in ___ days. °D. Follow up advised. Contact patient and schedule visit in ____weeks. ° °

## 2015-06-28 NOTE — Telephone Encounter (Signed)
Please come back for a follow-up appointment in 2 weeks

## 2015-06-28 NOTE — Telephone Encounter (Signed)
Read message below and advise Urban Gibson, please.

## 2015-07-09 NOTE — Progress Notes (Signed)
Subjective:    Patient ID: [name], male    DOB: 16-Nov-1955, 60 y.o.   MRN: [id number]  HPI Pt returns for f/u of diabetes mellitus: DM type: 1 Dx'ed: 123XX123 Complications: polyneuropathy, nephropathy and retinopathy.  Therapy: insulin since dx DKA: never Severe hypoglycemia: multiple episodes (last was late 2016).   Pancreatitis: never Other: he has done better with a qd insulin regimen; he was changed from lantus to levemir, due to mild hypoglycemia in the early hours of the morning; therapy limited by ongoing use of heroin, EtOH, and cocaine. Interval history: He changed to [employer] [facility], due to cost.  no cbg record, but states cbg's vary from 88-200.  It is in general highest at hs  He has 2 weeks of slight pain at the left mandibular area, and assoc swelling.   Past Medical History  Diagnosis Date  . Glaucoma   . HEPATITIS C     chronic  . DIABETES MELLITUS, TYPE I   . DRUG ABUSE     pt should have NO controlled substances rx'ed  . Vitiligo   . 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 DEPRESSION   . Hypertension 02/18/2011    Past Surgical History  Procedure Laterality Date  . Eye surgery      retinal surgery x 2, right eye    Social History   Social History  . Marital Status: Single    Spouse Name: N/A  . Number of Children: N/A  . Years of Education: N/A   Occupational History  .      disabled   Social History Main Topics  . Smoking status: Current Every Day Smoker -- 2.00 packs/day    Types: Cigarettes  . Smokeless tobacco: Not on file  . Alcohol Use: No  . Drug Use: Yes    Special: "Crack" cocaine     Comment: s/p rehab x 6  . Sexual Activity: Not on file   Other Topics Concern  . Not on file   Social History Narrative   Lives in boarding house/homeless   Ongoing occ drug use-crack   Single, disabled, prev mental health drug counselor.     Current Outpatient Prescriptions on File Prior to Visit  Medication Sig Dispense Refill  .  losartan (COZAAR) 50 MG tablet Take 1 tablet (50 mg total) by mouth daily. (Patient not taking: Reported on 07/10/2015) 90 tablet 3   No current facility-administered medications on file prior to visit.    Allergies  Allergen Reactions  . Codeine Itching    Family History  Problem Relation Age of Onset  . Arthritis Mother   . Heart disease Mother     CAD  . Diabetes Father   . Kidney disease Father     BP 128/74 mmHg  Pulse 83  Temp(Src) 98.1 F (36.7 C) (Oral)  Ht 6' (1.829 m)  Wt 161 lb (73.029 kg)  BMI 21.83 kg/m2  SpO2 97%  Review of Systems He has mild hypoglycemia approx approx 2-3 times per week, usually in the afternoon.  He denies fever.      Objective:   Physical Exam VITAL SIGNS:  See vs page GENERAL: no distress head: no deformity eyes: no periorbital swelling, no proptosis external nose and ears are normal mouth: slight swelling of the left lower dental area.  Pulses: dorsalis pedis intact bilat.   MSK: no deformity of the feet CV: no leg edema Skin:  no ulcer on the feet.  normal color and temp  on the feet. Neuro: sensation is intact to touch on the feet, but decreased from normal.     A1c=9.6%    Assessment & Plan:  DM: The pattern of his cbg's indicates he needs some adjustment in his therapy. Dental pain, new.  i told him i can rx antibiotic, but he needs to see a dentist.    Patient is advised the following: Patient Instructions  check your blood sugar twice a day.  vary the time of day when you check, between before the 3 meals, and at bedtime.  also check if you have symptoms of your blood sugar being too high or too low.  please keep a record of the readings and bring it to your next appointment here (or you can bring the meter itself).  You can write it on any piece of paper.  please call us sooner if your blood sugar goes below 70, or if you have a lot of readings over 200. Please reduce the NPH to 45 units each morning.  Add regular, 5  units with supper.  On this type of insulin schedule, you should eat meals on a regular schedule.  If a meal is missed or significantly delayed, your blood sugar could go low.   Please come back for a follow-up appointment in 2 months. i have sent a prescription to your pharmacy, for an antibiotic pill.  This is not adequate treatment for a dental infection.  Please see a dentist ASAP.    please see Dr Quay Burow soon.

## 2015-07-10 ENCOUNTER — Encounter: Payer: Self-pay | Admitting: Endocrinology

## 2015-07-10 ENCOUNTER — Ambulatory Visit (INDEPENDENT_AMBULATORY_CARE_PROVIDER_SITE_OTHER): Payer: Commercial Managed Care - HMO | Admitting: Endocrinology

## 2015-07-10 VITALS — BP 128/74 | HR 83 | Temp 98.1°F | Ht 72.0 in | Wt 161.0 lb

## 2015-07-10 DIAGNOSIS — E1043 Type 1 diabetes mellitus with diabetic autonomic (poly)neuropathy: Secondary | ICD-10-CM | POA: Diagnosis not present

## 2015-07-10 LAB — POCT GLYCOSYLATED HEMOGLOBIN (HGB A1C): Hemoglobin A1C: 9.6

## 2015-07-10 MED ORDER — AMOXICILLIN 500 MG PO CAPS: 500.0000 mg | ORAL_CAPSULE | Freq: Three times a day (TID) | ORAL | Status: DC

## 2015-07-10 NOTE — Patient Instructions (Addendum)
check your blood sugar twice a day.  vary the time of day when you check, between before the 3 meals, and at bedtime.  also check if you have symptoms of your blood sugar being too high or too low.  please keep a record of the readings and bring it to your next appointment here (or you can bring the meter itself).  You can write it on any piece of paper.  please call us sooner if your blood sugar goes below 70, or if you have a lot of readings over 200. Please reduce the NPH to 45 units each morning.  Add regular, 5 units with supper.  On this type of insulin schedule, you should eat meals on a regular schedule.  If a meal is missed or significantly delayed, your blood sugar could go low.   Please come back for a follow-up appointment in 2 months. i have sent a prescription to your pharmacy, for an antibiotic pill.  This is not adequate treatment for a dental infection.  Please see a dentist ASAP.    please see Dr Quay Burow soon.

## 2015-07-12 ENCOUNTER — Other Ambulatory Visit (INDEPENDENT_AMBULATORY_CARE_PROVIDER_SITE_OTHER): Payer: Self-pay

## 2015-07-12 ENCOUNTER — Encounter: Payer: Self-pay | Admitting: Internal Medicine

## 2015-07-12 ENCOUNTER — Ambulatory Visit (INDEPENDENT_AMBULATORY_CARE_PROVIDER_SITE_OTHER): Payer: Self-pay | Admitting: Internal Medicine

## 2015-07-12 VITALS — BP 110/52 | HR 63 | Temp 98.0°F | Resp 16 | Wt 158.0 lb

## 2015-07-12 DIAGNOSIS — I10 Essential (primary) hypertension: Secondary | ICD-10-CM

## 2015-07-12 DIAGNOSIS — E1065 Type 1 diabetes mellitus with hyperglycemia: Secondary | ICD-10-CM

## 2015-07-12 DIAGNOSIS — E1022 Type 1 diabetes mellitus with diabetic chronic kidney disease: Secondary | ICD-10-CM

## 2015-07-12 DIAGNOSIS — Z139 Encounter for screening, unspecified: Secondary | ICD-10-CM

## 2015-07-12 DIAGNOSIS — IMO0002 Reserved for concepts with insufficient information to code with codable children: Secondary | ICD-10-CM

## 2015-07-12 DIAGNOSIS — B182 Chronic viral hepatitis C: Secondary | ICD-10-CM

## 2015-07-12 DIAGNOSIS — N183 Chronic kidney disease, stage 3 (moderate): Secondary | ICD-10-CM

## 2015-07-12 DIAGNOSIS — Z23 Encounter for immunization: Secondary | ICD-10-CM

## 2015-07-12 DIAGNOSIS — F1910 Other psychoactive substance abuse, uncomplicated: Secondary | ICD-10-CM

## 2015-07-12 DIAGNOSIS — K089 Disorder of teeth and supporting structures, unspecified: Secondary | ICD-10-CM | POA: Insufficient documentation

## 2015-07-12 LAB — COMPREHENSIVE METABOLIC PANEL
ALT: 43 U/L (ref 0–53)
AST: 30 U/L (ref 0–37)
Albumin: 3.3 g/dL — ABNORMAL LOW (ref 3.5–5.2)
Alkaline Phosphatase: 98 U/L (ref 39–117)
BUN: 15 mg/dL (ref 6–23)
CHLORIDE: 105 meq/L (ref 96–112)
CO2: 31 meq/L (ref 19–32)
CREATININE: 0.93 mg/dL (ref 0.40–1.50)
Calcium: 9.8 mg/dL (ref 8.4–10.5)
GFR: 88.46 mL/min (ref >60.00)
GLUCOSE: 58 mg/dL — AB (ref 70–99)
POTASSIUM: 3.9 meq/L (ref 3.5–5.1)
SODIUM: 140 meq/L (ref 135–145)
Total Bilirubin: 0.4 mg/dL (ref 0.2–1.2)
Total Protein: 6.5 g/dL (ref 6.0–8.3)

## 2015-07-12 LAB — CBC WITH DIFFERENTIAL/PLATELET
BASOS ABS: 0.1 10*3/uL (ref 0.0–0.1)
Basophils Relative: 0.7 % (ref 0.0–3.0)
Eosinophils Absolute: 0.3 10*3/uL (ref 0.0–0.7)
Eosinophils Relative: 2.8 % (ref 0.0–5.0)
HCT: 39.8 % (ref 39.0–52.0)
HEMOGLOBIN: 13.8 g/dL (ref 13.0–17.0)
LYMPHS ABS: 2.4 10*3/uL (ref 0.7–4.0)
Lymphocytes Relative: 26.8 % (ref 12.0–46.0)
MCHC: 34.6 g/dL (ref 30.0–36.0)
MCV: 92.5 fl (ref 78.0–100.0)
MONO ABS: 0.6 10*3/uL (ref 0.1–1.0)
MONOS PCT: 7.1 % (ref 3.0–12.0)
NEUTROS PCT: 62.6 % (ref 43.0–77.0)
Neutro Abs: 5.5 10*3/uL (ref 1.4–7.7)
Platelets: 320 10*3/uL (ref 150.0–400.0)
RBC: 4.3 Mil/uL (ref 4.22–5.81)
RDW: 13.3 % (ref 11.5–15.5)
WBC: 8.9 10*3/uL (ref 4.0–10.5)

## 2015-07-12 NOTE — Assessment & Plan Note (Signed)
Management per Dr. Ellison 

## 2015-07-12 NOTE — Progress Notes (Signed)
Pre visit review using our clinic review tool, if applicable. No additional management support is needed unless otherwise documented below in the visit note. 

## 2015-07-12 NOTE — Progress Notes (Signed)
Subjective:    Patient ID: [name], male    DOB: Oct 18, 1955, 60 y.o.   MRN: [id number]  HPI He is here to establish with a new pcp.   He is on SSD.    Diabetes: He is seeing Dr Loanne Drilling.  He is taking his medication daily as prescribed. He tries to be compliant with a diabetic diet. He is not exercising regularly. He has neuropathy and retinopathy.  Hepatitis C:  He has had it over 25 years from IV drug use.  He id not qualify for treatment years ago because he was actively using drugs.  He is interested in treatment, but is unsure if the drug is covered.  He does use drugs on occasion, but can give it up and would give it up to get treated.   Hypertension: He is not taking his medication daily. He does not feel like he needs it.  He is not compliant with a low sodium diet.  He denies palpitations, edema and regular headaches. He is not exercising regularly.  He does not monitor his blood pressure at home.    Substance abuse:  He has a long history of drug use.  He drinks alcohol on occasion.  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  He smokes cigarettes.  He has been to rehab five times.  He used to be a [employer].   Medications and allergies reviewed with patient and updated if appropriate.  Patient Active Problem List   Diagnosis Date Noted  . Non-compliant behavior 09/01/2014  . Tobacco use 07/06/2013  . PAD (peripheral artery disease) ([place]) 04/29/2013  . COPD, mild ([place])   . 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 Hypertension   . POLYNEUROPATHY 06/21/2010  . PROLIFERATIVE DIABETIC RETINOPATHY 06/21/2010  . SKIN TAG 01/30/2010  . Chronic hepatitis C ([place]) 11/22/2008  . VITILIGO 11/22/2008  . PROTEINURIA, MILD 11/22/2008  . PERS HX NONCOMPLIANCE W/MED TX PRS HAZARDS HLTH 07/16/2007  . DRUG ABUSE 04/23/2007  . Type 1 diabetes mellitus ([place]) 11/11/2006  . DEPRESSION 11/11/2006    Current Outpatient Prescriptions  on File Prior to Visit  Medication Sig Dispense Refill  . amoxicillin (AMOXIL) 500 MG capsule Take 1 capsule (500 mg total) by mouth 3 (three) times daily. 30 capsule 0  . insulin NPH Human (NOVOLIN N) 100 UNIT/ML injection Inject 45 Units into the skin.     [name] insulin regular (NOVOLIN R,HUMULIN R) 100 units/mL injection Inject 5 Units into the skin daily with supper.    . losartan (COZAAR) 50 MG tablet Take 1 tablet (50 mg total) by mouth daily. 90 tablet 3   No current facility-administered medications on file prior to visit.    Past Medical History  Diagnosis Date  . Glaucoma   . HEPATITIS C     chronic  . DIABETES MELLITUS, TYPE I   . DRUG ABUSE     pt should have NO controlled substances rx'ed  . Vitiligo   . 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 DEPRESSION   . Hypertension 02/18/2011    Past Surgical History  Procedure Laterality Date  . Eye surgery      retinal surgery x 2, right eye    Social History   Social History  . Marital Status: Single    Spouse Name: N/A  . Number of Children: N/A  . Years of Education: N/A   Occupational History  .  disabled   Social History Main Topics  . Smoking status: Current Every Day Smoker -- 2.00 packs/day    Types: Cigarettes  . Smokeless tobacco: None  . Alcohol Use: No  . Drug Use: Yes    Special: "Crack" cocaine     Comment: s/p rehab x 6  . Sexual Activity: Not Asked   Other Topics Concern  . None   Social History Narrative   Lives in boarding house/homeless   Ongoing occ drug use-crack   Single, disabled, prev mental health drug counselor.     Family History  Problem Relation Age of Onset  . Arthritis Mother   . Heart disease Mother     CAD  . Diabetes Father   . Kidney disease Father     Review of Systems  Constitutional: Negative for fever and chills.  Respiratory: Positive for shortness of breath. Negative for cough and wheezing.   Cardiovascular: Positive for chest pain (sharp pains  that transient). Negative for palpitations and leg swelling.  Gastrointestinal: Positive for constipation. Negative for nausea, abdominal pain, diarrhea and blood in stool.       No gerd  Neurological: Positive for light-headedness (with hypoglycemia). Negative for headaches.       Objective:   Filed Vitals:   07/12/15 1542  BP: 110/52  Pulse: 63  Temp: 98 F (36.7 C)  Resp: 16   Filed Weights   07/12/15 1542  Weight: 158 lb (71.668 kg)   Body mass index is 21.42 kg/(m^2).   Physical Exam Constitutional: Appears well-developed and well-nourished. No distress.  Neck: poor dentition, Neck supple. No tracheal deviation present. No thyromegaly present.  No carotid bruit. No cervical adenopathy.   Cardiovascular: Normal rate, regular rhythm and normal heart sounds.   No murmur heard.  No edema Pulmonary/Chest: Effort normal and breath sounds normal. No respiratory distress. No wheezes.  Abdomen: soft, nontender, non distended, no mass, no HSM Psych: normal mood and affec      Assessment & Plan:   See Problem List for Assessment and Plan of chronic medical problems.  F/U in 6 months

## 2015-07-12 NOTE — Assessment & Plan Note (Signed)
BP controlled without medication Likely losartan was for renal protection, but he is not taking Will consider restarting at next visit cmp today

## 2015-07-12 NOTE — Assessment & Plan Note (Signed)
Stressed smoking cessation Stressed stopping heroin, crack and marijuana

## 2015-07-12 NOTE — Assessment & Plan Note (Signed)
Check viral load Stressed discontinue all illegal drugs if he truly wants treatment Will check AFP and Abdomen US Will refer to hepatic clinic Check cmp Hep A/B vaccine today - has never had it

## 2015-07-12 NOTE — Patient Instructions (Addendum)
  Test(s) ordered today. Your results will be released to [place] (or called to you) after review, usually within 72hours after test completion. If any changes need to be made, you will be notified at that same time.   Hepatitis A/B vaccine administered today.   Medications reviewed and updated.  No changes recommended at this time.   A referral was ordered for the hepatitis C clinic  Please followup in 6 months

## 2015-07-13 LAB — AFP TUMOR MARKER: AFP TUMOR MARKER: 2.8 ng/mL (ref <6.1)

## 2015-07-13 LAB — HIV ANTIBODY (ROUTINE TESTING W REFLEX): HIV: NONREACTIVE

## 2015-07-14 LAB — HEPATITIS C RNA QUANTITATIVE
HCV Quantitative Log: 5.42 {Log} — ABNORMAL HIGH (ref <1.18)
HCV Quantitative: 260868 IU/mL — ABNORMAL HIGH (ref <15)

## 2015-07-20 ENCOUNTER — Other Ambulatory Visit: Payer: Self-pay

## 2015-08-14 ENCOUNTER — Ambulatory Visit (INDEPENDENT_AMBULATORY_CARE_PROVIDER_SITE_OTHER): Payer: Commercial Managed Care - HMO | Admitting: Family

## 2015-08-14 ENCOUNTER — Encounter: Payer: Self-pay | Admitting: Family

## 2015-08-14 ENCOUNTER — Ambulatory Visit: Payer: Self-pay

## 2015-08-14 VITALS — BP 140/82 | HR 73 | Temp 98.2°F | Ht 72.0 in | Wt 164.0 lb

## 2015-08-14 DIAGNOSIS — H6122 Impacted cerumen, left ear: Secondary | ICD-10-CM

## 2015-08-14 DIAGNOSIS — R011 Cardiac murmur, unspecified: Secondary | ICD-10-CM

## 2015-08-14 DIAGNOSIS — Z23 Encounter for immunization: Secondary | ICD-10-CM

## 2015-08-14 NOTE — Patient Instructions (Signed)
Referral to cardiology.  Do not use Q-tips in the ear. Use Hydrogen Peroxide or Debrox drops weekly as needed for cerumen build up. Never stick anything into the ear canal. Return to clinic if thin puss drains out or if foul odor comes from ear.  If there is no improvement in your symptoms, or if there is any worsening of symptoms, or if you have any additional concerns, please return for re-evaluation; or, if we are closed, consider going to the Emergency Room for evaluation if symptoms urgent.  Heart Murmur A heart murmur is an extra sound heard by your health care provider when listening to your heart with a device called a stethoscope. The sound comes from turbulence when blood flows through the heart and may be a "hum" or "whoosh" sound heard when the heart beats. There are two types of heart murmurs:  Innocent murmurs. Most people with this type of heart murmur do not have a heart problem. Many children have innocent heart murmurs. Your health care provider may suggest some basic testing to know whether your murmur is an innocent murmur. If an innocent heart murmur is found, there is no need for further tests or treatment and no need to restrict activities or stop playing sports.  Abnormal murmurs. These types of murmurs can occur in children and adults. In children, abnormal heart murmurs are typically caused from heart defects that are present at birth (congenital). In adults, abnormal murmurs are usually from heart valve problems caused by disease, infection, or aging. CAUSES  Normally, these valves open to let blood flow through or out of your heart and then shut to keep it from flowing backward. If they do not work properly, you could have:  Regurgitation--When blood leaks back through the valve in the wrong direction.  Mitral valve prolapse--When the mitral valve of the heart has a loose flap and does not close tightly.  Stenosis--When the valve does not open enough and blocks blood  flow. SIGNS AND SYMPTOMS  Innocent murmurs do not cause symptoms, and many people with abnormal murmurs may or may not have symptoms. If symptoms do develop, they may include:  Shortness of breath.  Blue coloring of the skin, especially on the fingertips.  Chest pain.  Palpitations, or feeling a fluttering or skipped heartbeat.  Fainting.  Persistent cough.  Getting tired much faster than expected. DIAGNOSIS  A heart murmur might be heard during a sports physical or during any type of examination. When a murmur is heard, it may suggest a possible problem. When this happens, your health care provider may ask you to see a heart specialist (cardiologist). You may also be asked to have one or more heart tests. In these cases, testing may vary depending on what your health care provider heard. Tests for a heart murmur may include:  Electrocardiogram.  Echocardiogram.  MRI. For children and adults who have an abnormal heart murmur and want to play sports, it is important to complete testing, review test results, and receive recommendations from your health care provider. If heart disease is present, it may not be safe to play. TREATMENT  Innocent murmurs require no treatment or activity restriction. If an abnormal murmur represents a problem with the heart, treatment will depend on the exact nature of the problem. In these cases, medicine or surgery may be needed to treat the problem. HOME CARE INSTRUCTIONS If you want to participate in sports or other types of strenuous physical activity, it is important to discuss this  first with your health care provider. If the murmur represents a problem with the heart and you choose to participate in sports, there is a small chance that a serious problem (including sudden death) could result.  SEEK MEDICAL CARE IF:   You feel that your symptoms are slowly worsening.  You develop any new symptoms that cause concern.  You feel that you are having  side effects from any medicines prescribed. SEEK IMMEDIATE MEDICAL CARE IF:   You develop chest pain.  You have shortness of breath.  You notice that your heart beats irregularly often enough to cause you to worry.  You have fainting spells.  Your symptoms suddenly get worse.   This information is not intended to replace advice given to you by your health care provider. Make sure you discuss any questions you have with your health care provider.   Document Released: 05/09/2004 Document Revised: 04/22/2014 Document Reviewed: 12/07/2012 Elsevier Interactive Patient Education [employer].

## 2015-08-14 NOTE — Progress Notes (Signed)
Pre visit review using our clinic review tool, if applicable. No additional management support is needed unless otherwise documented below in the visit note. 

## 2015-08-14 NOTE — Progress Notes (Signed)
Subjective:    Patient ID: [name], male    DOB: 1956/01/27, 60 y.o.   MRN: [id number]   [name] is a 60 y.o. male who presents today for an acute visit.    HPI Comments: Patient here for Hep B shot primarily and thought he would also be evaluated for left ear "clogged". He was using a Q-tip yesterday and thinks he may have jammed ear wax down his ear. Notes he can't hear as well on the left ear. Denies any discharge, fever, chills.  Denies exertional chest pain or pressure, numbness or tingling radiating to left arm or jaw, palpitations, dizziness, frequent headaches, changes in vision, or shortness of breath.     Note, patient has had MI in the past. He was referred for cardiac stress test but did not have it done. This is a new murmur and he was never told he had a murmur before.   Past Medical History  Diagnosis Date  . Glaucoma   . HEPATITIS C     chronic  . DIABETES MELLITUS, TYPE I   . DRUG ABUSE     pt should have NO controlled substances rx'ed  . Vitiligo   . 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 DEPRESSION   . Hypertension 02/18/2011   Codeine Current Outpatient Prescriptions on File Prior to Visit  Medication Sig Dispense Refill  . insulin NPH Human (NOVOLIN N) 100 UNIT/ML injection Inject 45 Units into the skin.     [name] insulin regular (NOVOLIN R,HUMULIN R) 100 units/mL injection Inject 5 Units into the skin daily with supper.    [name] amoxicillin (AMOXIL) 500 MG capsule Take 1 capsule (500 mg total) by mouth 3 (three) times daily. (Patient not taking: Reported on 08/14/2015) 30 capsule 0   No current facility-administered medications on file prior to visit.    Social History  Substance Use Topics  . Smoking status: Current Every Day Smoker -- 2.00 packs/day    Types: Cigarettes  . Smokeless tobacco: None  . Alcohol Use: 0.0 oz/week    0 Standard drinks or equivalent per week     Comment: occasional    Review of Systems  Constitutional: Negative  for fever and chills.  HENT: Positive for hearing loss (right ear since last night). Negative for congestion, ear discharge, ear pain, rhinorrhea, sinus pressure and sore throat.   Respiratory: Negative for cough, shortness of breath and wheezing.   Cardiovascular: Negative for chest pain and palpitations.  Gastrointestinal: Negative for nausea, vomiting and diarrhea.  Musculoskeletal: Negative for myalgias.  Neurological: Negative for headaches.      Objective:    BP 140/82 mmHg  Pulse 73  Temp(Src) 98.2 F (36.8 C) (Oral)  Ht 6' (1.829 m)  Wt 164 lb (74.39 kg)  BMI 22.24 kg/m2  SpO2 95%   Physical Exam  Constitutional: Vital signs are normal. He appears well-developed and well-nourished.  HENT:  Head: Normocephalic and atraumatic.  Right Ear: Hearing, tympanic membrane, external ear and ear canal normal. No drainage, swelling or tenderness. Tympanic membrane is not injected, not erythematous and not bulging. No middle ear effusion. No decreased hearing is noted.  Left Ear: Hearing, tympanic membrane, external ear and ear canal normal. No drainage, swelling or tenderness. Tympanic membrane is not injected, not erythematous and not bulging.  No middle ear effusion. No decreased hearing is noted.  Nose: Nose normal. Right sinus exhibits no maxillary sinus tenderness and no frontal sinus tenderness. Left sinus exhibits no  maxillary sinus tenderness and no frontal sinus tenderness.  Mouth/Throat: Uvula is midline, oropharynx is clear and moist and mucous membranes are normal. No oropharyngeal exudate, posterior oropharyngeal edema, posterior oropharyngeal erythema or tonsillar abscesses.  Eyes: Conjunctivae are normal.  Cardiovascular: Regular rhythm.   Murmur heard.  Systolic murmur is present with a grade of 3/6  SEM III/VI, Loudest LSB, non radiating, no thrill   Pulmonary/Chest: Effort normal and breath sounds normal. No respiratory distress. He has no wheezes. He has no rhonchi.  He has no rales.  Lymphadenopathy:       Head (right side): No submental, no submandibular, no tonsillar, no preauricular, no posterior auricular and no occipital adenopathy present.       Head (left side): No submental, no submandibular, no tonsillar, no preauricular, no posterior auricular and no occipital adenopathy present.    He has no cervical adenopathy.  Neurological: He is alert.  Skin: Skin is warm and dry.  Psychiatric: He has a normal mood and affect. His speech is normal and behavior is normal.  Vitals reviewed.    Left ear cerumen impaction, resolved with multiple irrigation and manual removal.  After which the EAC and TM are clear.  Assessment & Plan:   1. Newly recognized murmur Reassured as patient is not having worrisome cardiac symptoms; however strong cardiac history including MI. Referred to cardiology for further evaluation of murmur.   2. Cerumen impaction, left Resolved.   I am having [name] maintain his insulin NPH Human, insulin regular, and amoxicillin.   No orders of the defined types were placed in this encounter.    3. Hepatitis B vaccination Received second dose.   Start medications as prescribed and explained to patient on After Visit Summary ( AVS). Risks, benefits, and alternatives of the medications and treatment plan prescribed today were discussed, and patient expressed understanding.   Education regarding symptom management and diagnosis given to patient.   Follow-up:Plan follow-up as discussed or as needed if any worsening symptoms or change in condition. No Follow-up on file.   Continue to follow with [name], MD for routine health maintenance.   Mariam Dollar and I agreed with plan.   Mable Paris, FNP

## 2015-09-01 ENCOUNTER — Ambulatory Visit (INDEPENDENT_AMBULATORY_CARE_PROVIDER_SITE_OTHER): Payer: Commercial Managed Care - HMO | Admitting: Endocrinology

## 2015-09-01 ENCOUNTER — Encounter: Payer: Self-pay | Admitting: Endocrinology

## 2015-09-01 VITALS — BP 136/70 | HR 78 | Wt 159.0 lb

## 2015-09-01 DIAGNOSIS — E162 Hypoglycemia, unspecified: Secondary | ICD-10-CM | POA: Diagnosis not present

## 2015-09-01 DIAGNOSIS — E109 Type 1 diabetes mellitus without complications: Secondary | ICD-10-CM

## 2015-09-01 LAB — POCT GLYCOSYLATED HEMOGLOBIN (HGB A1C): Hemoglobin A1C: 11.1

## 2015-09-01 LAB — POCT CBG (FASTING - GLUCOSE)-MANUAL ENTRY: Glucose Fasting, POC: 65 mg/dL — AB (ref 70–99)

## 2015-09-01 NOTE — Progress Notes (Signed)
Subjective:    Patient ID: [name], male    DOB: 07/03/1955, 60 y.o.   MRN: [id number]  HPI Pt returns for f/u of diabetes mellitus: DM type: 1 Dx'ed: 123XX123 Complications: polyneuropathy, nephropathy and retinopathy.  Therapy: insulin since dx DKA: never Severe hypoglycemia: multiple episodes (last was late 2016).   Pancreatitis: never Other: he has done better with a qd insulin regimen; he was changed from lantus to levemir, due to mild hypoglycemia in the early hours of the morning; therapy limited by ongoing use of heroin, EtOH, and cocaine; he says he cannot afford insulin analogs.    Interval history: no cbg record, but states cbg's are lowest after lunch, and highest at hs.  It is in general highest at hs.  He says he is missing meals due to lack of money.  He says he never misses the insulin.   Past Medical History  Diagnosis Date  . Glaucoma   . HEPATITIS C     chronic  . DIABETES MELLITUS, TYPE I   . DRUG ABUSE     pt should have NO controlled substances rx'ed  . Vitiligo   . 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 DEPRESSION   . Hypertension 02/18/2011    Past Surgical History  Procedure Laterality Date  . Eye surgery      retinal surgery x 2, right eye    Social History   Social History  . Marital Status: Single    Spouse Name: N/A  . Number of Children: N/A  . Years of Education: N/A   Occupational History  .      disabled   Social History Main Topics  . Smoking status: Current Every Day Smoker -- 2.00 packs/day    Types: Cigarettes  . Smokeless tobacco: Not on file  . Alcohol Use: 0.0 oz/week    0 Standard drinks or equivalent per week     Comment: occasional  . Drug Use: Yes    Special: "Crack" cocaine, Marijuana, Heroin     Comment: s/p rehab x 6, occasional drug use  . Sexual Activity: Not on file   Other Topics Concern  . Not on file   Social History Narrative   Lives in boarding house/homeless   Ongoing occ drug use-crack   Single, disabled, prev mental health drug counselor.     Current Outpatient Prescriptions on File Prior to Visit  Medication Sig Dispense Refill  . insulin NPH Human (NOVOLIN N) 100 UNIT/ML injection Inject 45 Units into the skin.     [name] insulin regular (NOVOLIN R,HUMULIN R) 100 units/mL injection Inject 8 Units into the skin daily with supper.      No current facility-administered medications on file prior to visit.    Allergies  Allergen Reactions  . Codeine Itching    Family History  Problem Relation Age of Onset  . Arthritis Mother   . Heart disease Mother     CAD  . Diabetes Father   . Kidney disease Father     BP 136/70 mmHg  Pulse 78  Wt 159 lb (72.122 kg)  SpO2 97%    Review of Systems He has lost a few lbs.     Objective:   Physical Exam VITAL SIGNS:  See vs page GENERAL: no distress Pulses: dorsalis pedis intact bilat.  MSK: no deformity of the feet CV: no leg edema Skin: no ulcer on the feet. normal color and temp on the feet. Neuro: sensation is intact to  touch on the feet, but decreased from normal.   a1c=11.1%    Assessment & Plan:  DM: rx is limited by poor social situation. he needs increased rx   Patient is advised the following: Patient Instructions  check your blood sugar twice a day.  vary the time of day when you check, between before the 3 meals, and at bedtime.  also check if you have symptoms of your blood sugar being too high or too low.  please keep a record of the readings and bring it to your next appointment here (or you can bring the meter itself).  You can write it on any piece of paper.  please call us sooner if your blood sugar goes below 70, or if you have a lot of readings over 200. Please continue the NPH, 45 units each morning, and: Increase the regular to 8 units with supper.   On this type of insulin schedule, you should eat meals on a regular schedule.  If a meal is missed or significantly delayed, your blood sugar  could go low.   Please come back for a follow-up appointment in 3 months.

## 2015-09-01 NOTE — Progress Notes (Signed)
Pre visit review using our clinic review tool, if applicable. No additional management support is needed unless otherwise documented below in the visit note. 

## 2015-09-01 NOTE — Patient Instructions (Addendum)
check your blood sugar twice a day.  vary the time of day when you check, between before the 3 meals, and at bedtime.  also check if you have symptoms of your blood sugar being too high or too low.  please keep a record of the readings and bring it to your next appointment here (or you can bring the meter itself).  You can write it on any piece of paper.  please call us sooner if your blood sugar goes below 70, or if you have a lot of readings over 200. Please continue the NPH, 45 units each morning, and: Increase the regular to 8 units with supper.   On this type of insulin schedule, you should eat meals on a regular schedule.  If a meal is missed or significantly delayed, your blood sugar could go low.   Please come back for a follow-up appointment in 3 months.

## 2015-09-07 ENCOUNTER — Telehealth: Payer: Self-pay | Admitting: Internal Medicine

## 2015-09-07 NOTE — Telephone Encounter (Signed)
Call him - let him know he will not be considered for treatment for his hepatitis C until he is drug free for 90 days.

## 2015-09-07 NOTE — Telephone Encounter (Signed)
See note below   Pt has to be drug free 90 days, please advise  09/06/2015-Called number listed-was friends number that he uses sometime but friend was unable to take a message.  Called PCP's office Mary-to get clarity on drug abuse and contact info-pt presently has drug problem-told Mary Dr. Linus Salmons doesnt treat pts until they are clean so that the treatment can be effective.  She said she would call me back if I needed to  proceed on the referral.  [place]

## 2015-09-08 NOTE — Telephone Encounter (Signed)
Called number listed and individual that answered informed me that pt can not be reached at that number and that he does live there anymore. He did not have a number that I was able to contact the patient.

## 2015-09-08 NOTE — Telephone Encounter (Signed)
Referral is going to be closed until pt can be reached.

## 2015-11-01 ENCOUNTER — Ambulatory Visit: Payer: Commercial Managed Care - HMO | Admitting: Cardiovascular Disease

## 2015-11-06 ENCOUNTER — Encounter: Payer: Self-pay | Admitting: Cardiovascular Disease

## 2015-12-01 ENCOUNTER — Ambulatory Visit (INDEPENDENT_AMBULATORY_CARE_PROVIDER_SITE_OTHER): Payer: Commercial Managed Care - HMO | Admitting: Endocrinology

## 2015-12-01 ENCOUNTER — Encounter: Payer: Self-pay | Admitting: Endocrinology

## 2015-12-01 VITALS — BP 122/80 | HR 79 | Ht 72.0 in | Wt 164.0 lb

## 2015-12-01 DIAGNOSIS — E1043 Type 1 diabetes mellitus with diabetic autonomic (poly)neuropathy: Secondary | ICD-10-CM | POA: Diagnosis not present

## 2015-12-01 LAB — POCT GLYCOSYLATED HEMOGLOBIN (HGB A1C): HEMOGLOBIN A1C: 7.8

## 2015-12-01 NOTE — Patient Instructions (Addendum)
check your blood sugar twice a day.  vary the time of day when you check, between before the 3 meals, and at bedtime.  also check if you have symptoms of your blood sugar being too high or too low.  please keep a record of the readings and bring it to your next appointment here (or you can bring the meter itself).  You can write it on any piece of paper.  please call us sooner if your blood sugar goes below 70, or if you have a lot of readings over 200. Please continue the NPH, 45 units each morning, and:  Take the regular, 2 units for any blood sugar over 300.  On this type of insulin schedule, you should eat meals on a regular schedule.  If a meal is missed or significantly delayed, your blood sugar could go low.   Please come back for a follow-up appointment in 3 months.

## 2015-12-01 NOTE — Progress Notes (Signed)
   Subjective:    Patient ID: [name], male    DOB: [birth year], 60 y.o.   MRN: [id number]  HPI Pt returns for f/u of diabetes mellitus: DM type: 1 Dx'ed: 123XX123 Complications: polyneuropathy, nephropathy and retinopathy.  Therapy: insulin since dx.   DKA: never Severe hypoglycemia: multiple episodes (last was late 2016).   Pancreatitis: never Other: he has done better with a qd insulin regimen; he was changed from lantus to levemir, due to mild hypoglycemia in the early hours of the morning; therapy limited by ongoing use of heroin, EtOH, and cocaine; he says he cannot afford insulin analogs.    Interval history: no cbg record, but states cbg's are lowest after lunch, and highest at hs.  It is in general highest at hs.  He says he is missing meals due to lack of money.  He says he never misses the insulin.    He has mild hypoglycemia approx once per week, and these episodes are mild.  He says meals are extremely irregular (size and timing).  He takes NPH, 45 units qam.  He seldom takes regular.    Review of Systems Denies LOC.      Objective:   Physical Exam VITAL SIGNS:  See vs page GENERAL: no distress Pulses: dorsalis pedis absent bilat.  MSK: no deformity of the feet CV: no leg edema Skin: no ulcer on the feet. normal color and temp on the feet. Neuro: sensation is intact to touch on the feet, but decreased from normal.   A1c=7.8%    Assessment & Plan:  Type 1 DM: this is the best control this pt should aim for, given this regimen, which does match insulin to his changing needs throughout the day.

## 2015-12-11 ENCOUNTER — Telehealth: Payer: Self-pay | Admitting: Endocrinology

## 2015-12-11 NOTE — Telephone Encounter (Signed)
Pt has no insulin how can we assist him

## 2015-12-11 NOTE — Telephone Encounter (Signed)
Pt is asking if the patient assistance is still available

## 2015-12-12 NOTE — Telephone Encounter (Signed)
Ok, just send [place] the form, and I'll sign it.

## 2015-12-12 NOTE — Telephone Encounter (Signed)
See messages and please advise if we can proceed with patient assistance. Thanks!

## 2015-12-14 NOTE — Telephone Encounter (Signed)
Requested a call back from the patient to discuss.  

## 2016-01-14 NOTE — Progress Notes (Signed)
Subjective:    Patient ID: [name], male    DOB: [birth year], 60 y.o.   MRN: [id number]  HPI The patient is here for follow up.  Diabetes with diabetic neuropathy: He is following with Dr Loanne Drilling.  He is taking his medication daily as prescribed. He is compliant with a diabetic diet. He is not exercising regularly. He monitors his sugars and they have been running 70-140 on average. He checks his feet daily and denies foot lesions. He is not up-to-date with an ophthalmology examination. His feet are painful.  He is unsure what meds he has taken prior.     Substance abuse:  He uses marijuana on occasionally.  He states he occasionally slips and does crack or heroin.  He is still smoking.   Hepatitis C:  He is not able to abstain from drugs and smoking and has not been a candidate for treatment.   Medications and allergies reviewed with patient and updated if appropriate.  Patient Active Problem List   Diagnosis Date Noted  . Poor dentition 07/12/2015  . Tobacco use 07/06/2013  . PAD (peripheral artery disease) ([place]) 04/29/2013  . COPD, mild ([place])   . 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 Hypertension   . Diabetic neuropathy ([place]) 06/21/2010  . PROLIFERATIVE DIABETIC RETINOPATHY 06/21/2010  . SKIN TAG 01/30/2010  . Chronic hepatitis C ([place]) 11/22/2008  . VITILIGO 11/22/2008  . PROTEINURIA, MILD 11/22/2008  . Substance abuse 04/23/2007  . DEPRESSION 11/11/2006    Current Outpatient Prescriptions on File Prior to Visit  Medication Sig Dispense Refill  . insulin NPH Human (NOVOLIN N) 100 UNIT/ML injection Inject 45 Units into the skin.      No current facility-administered medications on file prior to visit.     Past Medical History:  Diagnosis Date  . DEPRESSION   . DIABETES MELLITUS, TYPE I   . DRUG ABUSE    pt should have NO controlled substances rx'ed  . Glaucoma   . HEPATITIS C    chronic  . Hypertension 02/18/2011    . 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 Vitiligo     Past Surgical History:  Procedure Laterality Date  . EYE SURGERY     retinal surgery x 2, right eye    Social History   Social History  . Marital status: Single    Spouse name: N/A  . Number of children: N/A  . Years of education: N/A   Occupational History  .  Unemployed    disabled   Social History Main Topics  . Smoking status: Current Every Day Smoker    Packs/day: 2.00    Types: Cigarettes  . Smokeless tobacco: Not on file  . Alcohol use 0.0 oz/week     Comment: occasional  . Drug use:     Types: "Crack" cocaine, Marijuana, Heroin     Comment: s/p rehab x 6, occasional drug use  . Sexual activity: Not on file   Other Topics Concern  . Not on file   Social History Narrative   Lives in boarding house/homeless   Ongoing occ drug use-crack   Single, disabled, prev mental health drug counselor.     Family History  Problem Relation Age of Onset  . Arthritis Mother   . Heart disease Mother     CAD  . Diabetes Father   . Kidney disease Father     Review of Systems  Constitutional: Negative for fever.  Respiratory: Positive for cough, shortness of breath and wheezing.   Cardiovascular: Negative for chest pain, palpitations and leg swelling.  Gastrointestinal: Negative for abdominal pain.       No gerd  Neurological: Positive for numbness (with neuropathy). Negative for dizziness, light-headedness and headaches.       Leg and feet pain from neuropathy       Objective:   Vitals:   01/15/16 1442  BP: (!) 100/48  Pulse: 82  Resp: 16  Temp: 98.2 F (36.8 C)   Filed Weights   01/15/16 1442  Weight: 167 lb (75.8 kg)   Body mass index is 22.65 kg/m.   Physical Exam    Constitutional: Appears well-developed and well-nourished. No distress.  HENT:  Head: Normocephalic and atraumatic.  Neck: Neck supple. No tracheal deviation present. No thyromegaly present.  No cervical  lymphadenopathy Cardiovascular: Normal rate, regular rhythm and normal heart sounds.   2/6 systolic murmur heard. No carotid bruit .  No edema Pulmonary/Chest: Effort normal and breath sounds normal. No respiratory distress. Mild expir, occ wheezes. No rales.  Abdomen: soft, non-tender Skin: Skin is warm and dry. Not diaphoretic.  Psychiatric: Normal mood and affect. Behavior is normal.      Assessment & Plan:    See Problem List for Assessment and Plan of chronic medical problems.   F/u 6 months

## 2016-01-14 NOTE — Patient Instructions (Addendum)
Have your blood work done in one month.   Test(s) ordered today. Your results will be released to [place] (or called to you) after review, usually within 72hours after test completion. If any changes need to be made, you will be notified at that same time.   Flu vaccine administered today.   Medications reviewed and updated.  Changes include starting gabapentin at night.   Your prescription(s) have been submitted to your pharmacy. Please take as directed and contact our office if you believe you are having problem(s) with the medication(s).   Please followup in 6 months, sooner if needed

## 2016-01-15 ENCOUNTER — Encounter: Payer: Self-pay | Admitting: Internal Medicine

## 2016-01-15 ENCOUNTER — Ambulatory Visit (INDEPENDENT_AMBULATORY_CARE_PROVIDER_SITE_OTHER): Payer: Commercial Managed Care - HMO | Admitting: Internal Medicine

## 2016-01-15 VITALS — BP 100/48 | HR 82 | Temp 98.2°F | Resp 16 | Ht 72.0 in | Wt 167.0 lb

## 2016-01-15 DIAGNOSIS — N183 Chronic kidney disease, stage 3 (moderate): Secondary | ICD-10-CM

## 2016-01-15 DIAGNOSIS — F1910 Other psychoactive substance abuse, uncomplicated: Secondary | ICD-10-CM | POA: Diagnosis not present

## 2016-01-15 DIAGNOSIS — E1042 Type 1 diabetes mellitus with diabetic polyneuropathy: Secondary | ICD-10-CM | POA: Diagnosis not present

## 2016-01-15 DIAGNOSIS — B182 Chronic viral hepatitis C: Secondary | ICD-10-CM

## 2016-01-15 DIAGNOSIS — E1022 Type 1 diabetes mellitus with diabetic chronic kidney disease: Secondary | ICD-10-CM

## 2016-01-15 DIAGNOSIS — E1043 Type 1 diabetes mellitus with diabetic autonomic (poly)neuropathy: Secondary | ICD-10-CM

## 2016-01-15 DIAGNOSIS — Z23 Encounter for immunization: Secondary | ICD-10-CM

## 2016-01-15 DIAGNOSIS — IMO0002 Reserved for concepts with insufficient information to code with codable children: Secondary | ICD-10-CM

## 2016-01-15 DIAGNOSIS — E1065 Type 1 diabetes mellitus with hyperglycemia: Secondary | ICD-10-CM

## 2016-01-15 MED ORDER — GABAPENTIN 100 MG PO CAPS: ORAL_CAPSULE | ORAL | 3 refills | Status: DC

## 2016-01-15 NOTE — Progress Notes (Signed)
Pre visit review using our clinic review tool, if applicable. No additional management support is needed unless otherwise documented below in the visit note. 

## 2016-01-15 NOTE — Assessment & Plan Note (Signed)
Not a candidate for treatment due to active substance abuse

## 2016-01-15 NOTE — Assessment & Plan Note (Signed)
Last a1c 7.8 Much better controlled - has occasional low sugars Management per Dr Loanne Drilling

## 2016-01-15 NOTE — Assessment & Plan Note (Addendum)
Using marijuana occasionally Occasionally slips and uses crack/heroin Stressed cessation of all substances

## 2016-01-15 NOTE — Assessment & Plan Note (Signed)
Has pain and decreased ambulation due to pain Start gabapentin -titrate  He has been on this in the past, but does not recall it  Advised not to stop if he does not have relief - needs to be titrated

## 2016-02-15 ENCOUNTER — Ambulatory Visit: Payer: Commercial Managed Care - HMO

## 2016-03-01 ENCOUNTER — Ambulatory Visit: Payer: Commercial Managed Care - HMO | Admitting: Internal Medicine

## 2016-03-03 NOTE — Progress Notes (Deleted)
   Subjective:    Patient ID: [name], male    DOB: 05-Dec-1955, 60 y.o.   MRN: [id number]  HPI Pt returns for f/u of diabetes mellitus: DM type: 1 Dx'ed: 0289 Complications: polyneuropathy, nephropathy and retinopathy.  Therapy: insulin since dx.   DKA: never Severe hypoglycemia: multiple episodes (last was late 2016).   Pancreatitis: never Other: he has done better with a qd insulin regimen; he was changed from lantus to levemir, due to mild hypoglycemia in the early hours of the morning; therapy limited by ongoing use of heroin, EtOH, and cocaine; he says he cannot afford insulin analogs.    Interval history: He has mild hypoglycemia approx once per week, and these episodes are mild.  He says meals are extremely irregular (size and timing).  He takes NPH, 45 units qam.  He seldom takes regular.     Review of Systems     Objective:   Physical Exam VITAL SIGNS:  See vs page GENERAL: no distress Pulses: dorsalis pedis absent bilat.  MSK: no deformity of the feet CV: no leg edema Skin: no ulcer on the feet. normal color and temp on the feet. Neuro: sensation is intact to touch on the feet, but decreased from normal.       Assessment & Plan:  Type 1 DM, with retinopathy: this is the best control this pt should aim for, given this regimen, which does match insulin to his changing needs throughout the day.

## 2016-03-04 ENCOUNTER — Ambulatory Visit: Payer: Commercial Managed Care - HMO | Admitting: Endocrinology

## 2016-03-04 DIAGNOSIS — Z0289 Encounter for other administrative examinations: Secondary | ICD-10-CM

## 2016-04-18 ENCOUNTER — Emergency Department (HOSPITAL_COMMUNITY): Payer: Medicare HMO

## 2016-04-18 ENCOUNTER — Encounter (HOSPITAL_COMMUNITY): Payer: Self-pay | Admitting: Emergency Medicine

## 2016-04-18 ENCOUNTER — Inpatient Hospital Stay (HOSPITAL_COMMUNITY)
Admission: EM | Admit: 2016-04-18 | Discharge: 2016-04-21 | DRG: 637 | Disposition: A | Discharge status: Home / Self Care | Payer: Medicare HMO | Attending: Internal Medicine | Admitting: Internal Medicine

## 2016-04-18 DIAGNOSIS — E1040 Type 1 diabetes mellitus with diabetic neuropathy, unspecified: Secondary | ICD-10-CM | POA: Diagnosis present

## 2016-04-18 DIAGNOSIS — Z794 Long term (current) use of insulin: Secondary | ICD-10-CM | POA: Diagnosis not present

## 2016-04-18 DIAGNOSIS — R748 Abnormal levels of other serum enzymes: Secondary | ICD-10-CM | POA: Diagnosis not present

## 2016-04-18 DIAGNOSIS — D751 Secondary polycythemia: Secondary | ICD-10-CM | POA: Diagnosis present

## 2016-04-18 DIAGNOSIS — IMO0002 Reserved for concepts with insufficient information to code with codable children: Secondary | ICD-10-CM | POA: Diagnosis present

## 2016-04-18 DIAGNOSIS — I10 Essential (primary) hypertension: Secondary | ICD-10-CM | POA: Diagnosis not present

## 2016-04-18 DIAGNOSIS — R778 Other specified abnormalities of plasma proteins: Secondary | ICD-10-CM

## 2016-04-18 DIAGNOSIS — E872 Acidosis, unspecified: Secondary | ICD-10-CM

## 2016-04-18 DIAGNOSIS — R6510 Systemic inflammatory response syndrome (SIRS) of non-infectious origin without acute organ dysfunction: Secondary | ICD-10-CM | POA: Diagnosis present

## 2016-04-18 DIAGNOSIS — N183 Chronic kidney disease, stage 3 (moderate): Secondary | ICD-10-CM

## 2016-04-18 DIAGNOSIS — E1022 Type 1 diabetes mellitus with diabetic chronic kidney disease: Secondary | ICD-10-CM

## 2016-04-18 DIAGNOSIS — E1065 Type 1 diabetes mellitus with hyperglycemia: Secondary | ICD-10-CM

## 2016-04-18 DIAGNOSIS — R509 Fever, unspecified: Secondary | ICD-10-CM | POA: Diagnosis not present

## 2016-04-18 DIAGNOSIS — J449 Chronic obstructive pulmonary disease, unspecified: Secondary | ICD-10-CM | POA: Diagnosis present

## 2016-04-18 DIAGNOSIS — I4891 Unspecified atrial fibrillation: Secondary | ICD-10-CM

## 2016-04-18 DIAGNOSIS — Z885 Allergy status to narcotic agent status: Secondary | ICD-10-CM | POA: Diagnosis not present

## 2016-04-18 DIAGNOSIS — E1010 Type 1 diabetes mellitus with ketoacidosis without coma: Secondary | ICD-10-CM | POA: Diagnosis not present

## 2016-04-18 DIAGNOSIS — R112 Nausea with vomiting, unspecified: Secondary | ICD-10-CM | POA: Diagnosis not present

## 2016-04-18 DIAGNOSIS — F17210 Nicotine dependence, cigarettes, uncomplicated: Secondary | ICD-10-CM | POA: Diagnosis present

## 2016-04-18 DIAGNOSIS — D72829 Elevated white blood cell count, unspecified: Secondary | ICD-10-CM

## 2016-04-18 DIAGNOSIS — B1920 Unspecified viral hepatitis C without hepatic coma: Secondary | ICD-10-CM | POA: Diagnosis present

## 2016-04-18 DIAGNOSIS — F1410 Cocaine abuse, uncomplicated: Secondary | ICD-10-CM | POA: Diagnosis present

## 2016-04-18 DIAGNOSIS — H409 Unspecified glaucoma: Secondary | ICD-10-CM | POA: Diagnosis present

## 2016-04-18 DIAGNOSIS — E871 Hypo-osmolality and hyponatremia: Secondary | ICD-10-CM | POA: Diagnosis present

## 2016-04-18 DIAGNOSIS — E43 Unspecified severe protein-calorie malnutrition: Secondary | ICD-10-CM | POA: Diagnosis present

## 2016-04-18 DIAGNOSIS — N179 Acute kidney failure, unspecified: Secondary | ICD-10-CM | POA: Diagnosis not present

## 2016-04-18 DIAGNOSIS — Z6821 Body mass index (BMI) 21.0-21.9, adult: Secondary | ICD-10-CM

## 2016-04-18 DIAGNOSIS — E1042 Type 1 diabetes mellitus with diabetic polyneuropathy: Secondary | ICD-10-CM | POA: Diagnosis not present

## 2016-04-18 DIAGNOSIS — E875 Hyperkalemia: Secondary | ICD-10-CM | POA: Diagnosis present

## 2016-04-18 DIAGNOSIS — Z9119 Patient's noncompliance with other medical treatment and regimen: Secondary | ICD-10-CM | POA: Diagnosis not present

## 2016-04-18 DIAGNOSIS — E1140 Type 2 diabetes mellitus with diabetic neuropathy, unspecified: Secondary | ICD-10-CM | POA: Diagnosis present

## 2016-04-18 DIAGNOSIS — E1029 Type 1 diabetes mellitus with other diabetic kidney complication: Secondary | ICD-10-CM | POA: Diagnosis present

## 2016-04-18 DIAGNOSIS — R7989 Other specified abnormal findings of blood chemistry: Secondary | ICD-10-CM

## 2016-04-18 DIAGNOSIS — R000 Tachycardia, unspecified: Secondary | ICD-10-CM | POA: Diagnosis not present

## 2016-04-18 DIAGNOSIS — R079 Chest pain, unspecified: Secondary | ICD-10-CM | POA: Diagnosis not present

## 2016-04-18 LAB — CBC
HCT: 41.7 % (ref 39.0–52.0)
HEMATOCRIT: 49.5 % (ref 39.0–52.0)
HEMOGLOBIN: 15.8 g/dL (ref 13.0–17.0)
Hemoglobin: 15.1 g/dL (ref 13.0–17.0)
MCH: 31.1 pg (ref 26.0–34.0)
MCH: 31.6 pg (ref 26.0–34.0)
MCHC: 31.9 g/dL (ref 30.0–36.0)
MCHC: 36.2 g/dL — ABNORMAL HIGH (ref 30.0–36.0)
MCV: 97.4 fL (ref 78.0–100.0)
MCV: 87.2 fL (ref 78.0–100.0)
PLATELETS: 280 10*3/uL (ref 150–400)
Platelets: 335 10*3/uL (ref 150–400)
RBC: 5.08 MIL/uL (ref 4.22–5.81)
RBC: 4.78 MIL/uL (ref 4.22–5.81)
RDW: 13.3 % (ref 11.5–15.5)
RDW: 12.8 % (ref 11.5–15.5)
WBC: 27 10*3/uL — ABNORMAL HIGH (ref 4.0–10.5)
WBC: 24.3 10*3/uL — AB (ref 4.0–10.5)

## 2016-04-18 LAB — URINALYSIS, ROUTINE W REFLEX MICROSCOPIC
Bilirubin Urine: NEGATIVE
Glucose, UA: >=500 mg/dL — AB
Ketones, ur: 20 mg/dL — AB
Leukocytes, UA: NEGATIVE
NITRITE: NEGATIVE
Protein, ur: 100 mg/dL — AB
Specific Gravity, Urine: 1.02 (ref 1.005–1.030)
pH: 5 (ref 5.0–8.0)

## 2016-04-18 LAB — GLUCOSE, CAPILLARY
GLUCOSE-CAPILLARY: 371 mg/dL — AB (ref 65–99)
Glucose-Capillary: 267 mg/dL — ABNORMAL HIGH (ref 65–99)
Glucose-Capillary: 223 mg/dL — ABNORMAL HIGH (ref 65–99)
Glucose-Capillary: 139 mg/dL — ABNORMAL HIGH (ref 65–99)

## 2016-04-18 LAB — COMPREHENSIVE METABOLIC PANEL
ALT: 31 U/L (ref 17–63)
ANION GAP: 34 — AB (ref 5–15)
AST: 25 U/L (ref 15–41)
Albumin: 3.3 g/dL — ABNORMAL LOW (ref 3.5–5.0)
Alkaline Phosphatase: 102 U/L (ref 38–126)
BUN: 88 mg/dL — ABNORMAL HIGH (ref 6–20)
CHLORIDE: 72 mmol/L — AB (ref 101–111)
CO2: 13 mmol/L — AB (ref 22–32)
Calcium: 8.7 mg/dL — ABNORMAL LOW (ref 8.9–10.3)
Creatinine, Ser: 2.89 mg/dL — ABNORMAL HIGH (ref 0.61–1.24)
GFR calc Af Amer: 26 mL/min — ABNORMAL LOW (ref >60)
GFR calc non Af Amer: 22 mL/min — ABNORMAL LOW (ref >60)
GLUCOSE: 1237 mg/dL — AB (ref 65–99)
POTASSIUM: 5 mmol/L (ref 3.5–5.1)
SODIUM: 119 mmol/L — AB (ref 135–145)
TOTAL PROTEIN: 6.8 g/dL (ref 6.5–8.1)
Total Bilirubin: 0.8 mg/dL (ref 0.3–1.2)

## 2016-04-18 LAB — I-STAT CHEM 8, ED
BUN: 81 mg/dL — ABNORMAL HIGH (ref 6–20)
CALCIUM ION: 1.04 mmol/L — AB (ref 1.15–1.40)
CREATININE: 2.4 mg/dL — AB (ref 0.61–1.24)
Chloride: 81 mmol/L — ABNORMAL LOW (ref 101–111)
HCT: 51 % (ref 39.0–52.0)
HEMOGLOBIN: 17.3 g/dL — AB (ref 13.0–17.0)
Potassium: 5.1 mmol/L (ref 3.5–5.1)
Sodium: 118 mmol/L — CL (ref 135–145)
TCO2: 18 mmol/L (ref 0–100)

## 2016-04-18 LAB — CBG MONITORING, ED
GLUCOSE-CAPILLARY: 527 mg/dL — AB (ref 65–99)
Glucose-Capillary: 447 mg/dL — ABNORMAL HIGH (ref 65–99)

## 2016-04-18 LAB — BLOOD GAS, VENOUS
ACID-BASE DEFICIT: 11.8 mmol/L — AB (ref 0.0–2.0)
Bicarbonate: 13.7 mmol/L — ABNORMAL LOW (ref 20.0–28.0)
O2 SAT: 80.6 %
PATIENT TEMPERATURE: 98.6
pCO2, Ven: 30.9 mmHg — ABNORMAL LOW (ref 44.0–60.0)
pH, Ven: 7.27 (ref 7.250–7.430)
pO2, Ven: 54.5 mmHg — ABNORMAL HIGH (ref 32.0–45.0)

## 2016-04-18 LAB — MRSA PCR SCREENING: MRSA by PCR: NEGATIVE

## 2016-04-18 LAB — I-STAT TROPONIN, ED: Troponin i, poc: 0.33 ng/mL (ref 0.00–0.08)

## 2016-04-18 LAB — BASIC METABOLIC PANEL
ANION GAP: 11 (ref 5–15)
BUN: 69 mg/dL — ABNORMAL HIGH (ref 6–20)
CHLORIDE: 96 mmol/L — AB (ref 101–111)
CO2: 26 mmol/L (ref 22–32)
CREATININE: 1.93 mg/dL — AB (ref 0.61–1.24)
Calcium: 7.9 mg/dL — ABNORMAL LOW (ref 8.9–10.3)
GFR calc non Af Amer: 36 mL/min — ABNORMAL LOW (ref >60)
GFR, EST AFRICAN AMERICAN: 42 mL/min — AB (ref >60)
Glucose, Bld: 437 mg/dL — ABNORMAL HIGH (ref 65–99)
Potassium: 3.6 mmol/L (ref 3.5–5.1)
Sodium: 133 mmol/L — ABNORMAL LOW (ref 135–145)

## 2016-04-18 LAB — I-STAT CG4 LACTIC ACID, ED: LACTIC ACID, VENOUS: 4.51 mmol/L — AB (ref 0.5–1.9)

## 2016-04-18 LAB — LIPASE, BLOOD: LIPASE: 17 U/L (ref 11–51)

## 2016-04-18 LAB — MAGNESIUM: MAGNESIUM: 2.2 mg/dL (ref 1.7–2.4)

## 2016-04-18 LAB — PHOSPHORUS: Phosphorus: 1.6 mg/dL — ABNORMAL LOW (ref 2.5–4.6)

## 2016-04-18 MED ORDER — SODIUM CHLORIDE 0.9 % IV BOLUS (SEPSIS)
1000.0000 mL | Freq: Once | INTRAVENOUS | Status: AC
  Administered 2016-04-18: 1000 mL via INTRAVENOUS

## 2016-04-18 MED ORDER — PIPERACILLIN-TAZOBACTAM 3.375 G IVPB 30 MIN
3.3750 g | Freq: Once | INTRAVENOUS | Status: AC
  Administered 2016-04-18: 3.375 g via INTRAVENOUS
  Filled 2016-04-18: qty 50

## 2016-04-18 MED ORDER — ENOXAPARIN SODIUM 40 MG/0.4ML ~~LOC~~ SOLN
40.0000 mg | SUBCUTANEOUS | Status: DC
  Administered 2016-04-18 – 2016-04-19 (×2): 40 mg via SUBCUTANEOUS
  Filled 2016-04-18 (×2): qty 0.4

## 2016-04-18 MED ORDER — SODIUM CHLORIDE 0.9% FLUSH
3.0000 mL | Freq: Two times a day (BID) | INTRAVENOUS | Status: DC
  Administered 2016-04-18 – 2016-04-20 (×4): 3 mL via INTRAVENOUS

## 2016-04-18 MED ORDER — SODIUM CHLORIDE 0.9 % IV SOLN
INTRAVENOUS | Status: DC
  Filled 2016-04-18: qty 2.5

## 2016-04-18 MED ORDER — PIPERACILLIN-TAZOBACTAM 3.375 G IVPB
3.3750 g | Freq: Three times a day (TID) | INTRAVENOUS | Status: DC
  Administered 2016-04-18 – 2016-04-20 (×5): 3.375 g via INTRAVENOUS
  Filled 2016-04-18 (×5): qty 50

## 2016-04-18 MED ORDER — ONDANSETRON HCL 4 MG PO TABS
4.0000 mg | ORAL_TABLET | Freq: Four times a day (QID) | ORAL | Status: DC | PRN
  Administered 2016-04-20 (×2): 4 mg via ORAL
  Filled 2016-04-18 (×2): qty 1

## 2016-04-18 MED ORDER — DEXTROSE-NACL 5-0.45 % IV SOLN: INTRAVENOUS | Status: DC

## 2016-04-18 MED ORDER — ONDANSETRON 4 MG PO TBDP
4.0000 mg | ORAL_TABLET | Freq: Once | ORAL | Status: AC | PRN
  Administered 2016-04-18: 4 mg via ORAL
  Filled 2016-04-18: qty 1

## 2016-04-18 MED ORDER — SODIUM CHLORIDE 0.9 % IV SOLN
INTRAVENOUS | Status: DC
  Administered 2016-04-18: 15:00:00 via INTRAVENOUS

## 2016-04-18 MED ORDER — SODIUM CHLORIDE 0.9 % IV SOLN
INTRAVENOUS | Status: DC
  Administered 2016-04-18: 5.4 [IU]/h via INTRAVENOUS
  Filled 2016-04-18: qty 2.5

## 2016-04-18 MED ORDER — ONDANSETRON HCL 4 MG/2ML IJ SOLN
4.0000 mg | Freq: Four times a day (QID) | INTRAMUSCULAR | Status: DC | PRN
  Administered 2016-04-21: 4 mg via INTRAVENOUS
  Filled 2016-04-18: qty 2

## 2016-04-18 MED ORDER — VANCOMYCIN HCL IN DEXTROSE 1-5 GM/200ML-% IV SOLN
1000.0000 mg | Freq: Once | INTRAVENOUS | Status: AC
  Administered 2016-04-18: 1000 mg via INTRAVENOUS
  Filled 2016-04-18: qty 200

## 2016-04-18 MED ORDER — SODIUM CHLORIDE 0.9 % IV SOLN
INTRAVENOUS | Status: DC
  Administered 2016-04-18: 20:00:00 via INTRAVENOUS

## 2016-04-18 MED ORDER — DEXTROSE-NACL 5-0.45 % IV SOLN
INTRAVENOUS | Status: DC
  Administered 2016-04-18: 23:00:00 via INTRAVENOUS

## 2016-04-18 MED ORDER — VANCOMYCIN HCL IN DEXTROSE 1-5 GM/200ML-% IV SOLN
1000.0000 mg | INTRAVENOUS | Status: DC
  Administered 2016-04-19 – 2016-04-20 (×2): 1000 mg via INTRAVENOUS
  Filled 2016-04-18 (×2): qty 200

## 2016-04-18 MED ORDER — ACETAMINOPHEN 650 MG RE SUPP: 650.0000 mg | Freq: Four times a day (QID) | RECTAL | Status: DC | PRN

## 2016-04-18 MED ORDER — ENOXAPARIN SODIUM 40 MG/0.4ML ~~LOC~~ SOLN: 40.0000 mg | SUBCUTANEOUS | Status: DC

## 2016-04-18 MED ORDER — SODIUM CHLORIDE 0.9 % IV SOLN: INTRAVENOUS | Status: AC

## 2016-04-18 MED ORDER — POLYETHYLENE GLYCOL 3350 17 G PO PACK: 17.0000 g | PACK | Freq: Every day | ORAL | Status: DC | PRN

## 2016-04-18 MED ORDER — ACETAMINOPHEN 325 MG PO TABS
650.0000 mg | ORAL_TABLET | Freq: Four times a day (QID) | ORAL | Status: DC | PRN
  Administered 2016-04-19 – 2016-04-21 (×5): 650 mg via ORAL
  Filled 2016-04-18 (×5): qty 2

## 2016-04-18 MED ORDER — DILTIAZEM HCL 25 MG/5ML IV SOLN
10.0000 mg | Freq: Once | INTRAVENOUS | Status: AC
  Administered 2016-04-18: 10 mg via INTRAVENOUS
  Filled 2016-04-18: qty 5

## 2016-04-18 NOTE — ED Provider Notes (Signed)
[facility] DEPT Provider Note   CSN: [id number] Arrival date & time: 04/18/16  1245     History   Chief Complaint Chief Complaint  Patient presents with  . Emesis  . Hyperglycemia    HPI [name] is a 61 y.o. male.  HPI   61 year old male with a history of diabetes, COPD, hep C, depression who presents with concern for generalized weakness, nausea and vomiting, excessive thirst, chest pain and palpitations. Patient does admit he used crack 4 days ago, and symptoms developed after this. He is on insulin, however continue to use a friend's.  Reports that despite taking insulin and not eating for last 3 days,  his glucose has been running upwards of 400. Reports he's also had chills. Denies any cough, congestion, dysuria, rash. Denies known fevers at home. Does report he last injected heroin 2 weeks ago. He reports that he has plans to discontinue his substance abuse. He has had 20-30 episodes of emesis. Heart palpitations, feeling of aching in middle chest.   Past Medical History:  Diagnosis Date  . DEPRESSION   . DIABETES MELLITUS, TYPE I   . DRUG ABUSE    pt should have NO controlled substances rx'ed  . Glaucoma   . HEPATITIS C    chronic  . Hypertension 02/18/2011  . 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 Vitiligo     Patient Active Problem List   Diagnosis Date Noted  . DKA, type 1 ([place]) 04/18/2016  . AKI (acute kidney injury) ([place]) 04/18/2016  . Hyponatremia 04/18/2016  . Lactic acidosis 04/18/2016  . SIRS (systemic inflammatory response syndrome) ([place]) 04/18/2016  . Severe protein-calorie malnutrition ([place]) 04/18/2016  . Elevated troponin 04/18/2016  . Poor dentition 07/12/2015  . Tobacco use 07/06/2013  . PAD (peripheral artery disease) ([place]) 04/29/2013  . COPD, mild ([place])   . 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 Diabetic neuropathy ([place]) 06/21/2010  . PROLIFERATIVE DIABETIC RETINOPATHY 06/21/2010  .  SKIN TAG 01/30/2010  . Chronic hepatitis C ([place]) 11/22/2008  . VITILIGO 11/22/2008  . PROTEINURIA, MILD 11/22/2008  . Substance abuse 04/23/2007  . DEPRESSION 11/11/2006    Past Surgical History:  Procedure Laterality Date  . EYE SURGERY     retinal surgery x 2, right eye       Home Medications    Prior to Admission medications   Medication Sig Start Date End Date Taking? Authorizing Provider  insulin NPH Human (NOVOLIN N) 100 UNIT/ML injection Inject 50 Units into the skin daily before breakfast.    Yes Historical Provider, MD  gabapentin (NEURONTIN) 100 MG capsule Take one a night, after two nights increase to 2 caps at night, if tolerated increase to 3 caps at night Patient not taking: Reported on 04/18/2016 01/15/16   [name], MD    Family History Family History  Problem Relation Age of Onset  . Diabetes Father   . Kidney disease Father   . Arthritis Mother   . Heart disease Mother     CAD    Social History Social History  Substance Use Topics  . Smoking status: Current Every Day Smoker    Packs/day: 2.00    Types: Cigarettes  . Smokeless tobacco: Never Used  . Alcohol use 0.0 oz/week     Comment: occasional     Allergies   Codeine   Review of Systems Review of Systems  Constitutional: Positive for appetite change, chills and fatigue. Negative for fever.  HENT: Negative for sore throat.   Eyes: Negative for visual disturbance.  Respiratory: Negative for cough and shortness of breath.   Cardiovascular: Positive for chest pain and palpitations.  Gastrointestinal: Positive for nausea and vomiting. Negative for abdominal pain, constipation and diarrhea.  Genitourinary: Positive for frequency. Negative for difficulty urinating and dysuria.  Musculoskeletal: Negative for back pain and neck stiffness.  Skin: Negative for rash.  Neurological: Negative for syncope and headaches.     Physical Exam Updated Vital Signs BP 176/55 (BP Location: Right Arm)    Pulse 91   Temp 97.9 F (36.6 C) (Rectal)   Resp 24   Ht 6' (1.829 m)   Wt 165 lb (74.8 kg)   SpO2 96%   BMI 22.38 kg/m   Physical Exam  Constitutional: He is oriented to person, place, and time. He appears well-developed and well-nourished. He appears ill. No distress.  HENT:  Head: Normocephalic and atraumatic.  Dry mucous membranes  Eyes: Conjunctivae and EOM are normal.  Neck: Normal range of motion.  Cardiovascular: Normal heart sounds and intact distal pulses.  An irregularly irregular rhythm present. Tachycardia present.  Exam reveals no gallop and no friction rub.   No murmur heard. Pulmonary/Chest: Effort normal. No respiratory distress. He has wheezes (occasional end expiratory). He has no rales.  Abdominal: Soft. He exhibits no distension. There is no tenderness. There is no guarding.  Musculoskeletal: He exhibits no edema.  Neurological: He is alert and oriented to person, place, and time.  Skin: Skin is warm and dry. He is not diaphoretic.  Nursing note and vitals reviewed.    ED Treatments / Results  Labs (all labs ordered are listed, but only abnormal results are displayed) Labs Reviewed  COMPREHENSIVE METABOLIC PANEL - Abnormal; Notable for the following:       Result Value   Sodium 119 (*)    Chloride 72 (*)    CO2 13 (*)    Glucose, Bld 1,237 (*)    BUN 88 (*)    Creatinine, Ser 2.89 (*)    Calcium 8.7 (*)    Albumin 3.3 (*)    GFR calc non Af Amer 22 (*)    GFR calc Af Amer 26 (*)    Anion gap 34 (*)    All other components within normal limits  CBC - Abnormal; Notable for the following:    WBC 27.0 (*)    All other components within normal limits  URINALYSIS, ROUTINE W REFLEX MICROSCOPIC - Abnormal; Notable for the following:    Color, Urine STRAW (*)    Glucose, UA >=500 (*)    Hgb urine dipstick SMALL (*)    Ketones, ur 20 (*)    Protein, ur 100 (*)    Bacteria, UA RARE (*)    Squamous Epithelial / LPF 0-5 (*)    All other components  within normal limits  BLOOD GAS, VENOUS - Abnormal; Notable for the following:    pCO2, Ven 30.9 (*)    pO2, Ven 54.5 (*)    Bicarbonate 13.7 (*)    Acid-base deficit 11.8 (*)    All other components within normal limits  CBG MONITORING, ED - Abnormal; Notable for the following:    Glucose-Capillary >600 (*)    All other components within normal limits  CBG MONITORING, ED - Abnormal; Notable for the following:    Glucose-Capillary >600 (*)    All other components within normal limits  I-STAT CG4 LACTIC ACID, ED - Abnormal;  Notable for the following:    Lactic Acid, Venous 4.51 (*)    All other components within normal limits  I-STAT CHEM 8, ED - Abnormal; Notable for the following:    Sodium 118 (*)    Chloride 81 (*)    BUN 81 (*)    Creatinine, Ser 2.40 (*)    Glucose, Bld >700 (*)    Calcium, Ion 1.04 (*)    Hemoglobin 17.3 (*)    All other components within normal limits  I-STAT TROPOININ, ED - Abnormal; Notable for the following:    Troponin i, poc 0.33 (*)    All other components within normal limits  CBG MONITORING, ED - Abnormal; Notable for the following:    Glucose-Capillary >600 (*)    All other components within normal limits  CBG MONITORING, ED - Abnormal; Notable for the following:    Glucose-Capillary >600 (*)    All other components within normal limits  CBG MONITORING, ED - Abnormal; Notable for the following:    Glucose-Capillary 527 (*)    All other components within normal limits  CULTURE, BLOOD (ROUTINE X 2)  CULTURE, BLOOD (ROUTINE X 2)  LIPASE, BLOOD  I-STAT VENOUS BLOOD GAS, ED  I-STAT CG4 LACTIC ACID, ED    EKG  EKG Interpretation  Date/Time:  Thursday April 18 2016 13:10:32 EST Ventricular Rate:  165 PR Interval:    QRS Duration: 97 QT Interval:  295 QTC Calculation: 489 R Axis:   -86 Text Interpretation:  Atrial fibrillation with rapid V-rate Left anterior fascicular block Anterior infarct, old Repolarization abnormality, prob rate  related Baseline wander in lead(s) V3 Since prior ECG, patient is now in atrial fibrillation with RVR Confirmed by [facility] MD, [place] ([id number]) on 04/18/2016 1:28:03 PM       Radiology Dg Chest Portable 1 View  Result Date: 04/18/2016 CLINICAL DATA:  Atrial fibrillation EXAM: PORTABLE CHEST 1 VIEW COMPARISON:  02/09/2015 FINDINGS: Heart and mediastinal contours are within normal limits. No focal opacities or effusions. No acute bony abnormality. IMPRESSION: No active disease. Electronically Signed   By: Rolm Baptise M.D.   On: 04/18/2016 14:03    Procedures Procedures (including critical care time)  Medications Ordered in ED Medications  vancomycin (VANCOCIN) IVPB 1000 mg/200 mL premix (not administered)  piperacillin-tazobactam (ZOSYN) IVPB 3.375 g (not administered)  insulin regular (NOVOLIN R,HUMULIN R) 250 Units in sodium chloride 0.9 % 250 mL (1 Units/mL) infusion (16.2 Units/hr Intravenous Rate/Dose Change 04/18/16 1659)  dextrose 5 %-0.45 % sodium chloride infusion ( Intravenous Hold 04/18/16 1424)  0.9 %  sodium chloride infusion ( Intravenous New Bag/Given 04/18/16 1445)  ondansetron (ZOFRAN-ODT) disintegrating tablet 4 mg (4 mg Oral Given 04/18/16 1401)  sodium chloride 0.9 % bolus 1,000 mL (0 mLs Intravenous Stopped 04/18/16 1519)  sodium chloride 0.9 % bolus 1,000 mL (0 mLs Intravenous Stopped 04/18/16 1658)  vancomycin (VANCOCIN) IVPB 1000 mg/200 mL premix (0 mg Intravenous Stopped 04/18/16 1659)  piperacillin-tazobactam (ZOSYN) IVPB 3.375 g (0 g Intravenous Stopped 04/18/16 1446)  diltiazem (CARDIZEM) injection 10 mg (10 mg Intravenous Given 04/18/16 1513)  sodium chloride 0.9 % bolus 1,000 mL (1,000 mLs Intravenous New Bag/Given 04/18/16 1700)  sodium chloride 0.9 % bolus 1,000 mL (1,000 mLs Intravenous New Bag/Given 04/18/16 1700)   CRITICAL CARE: atrial fibrillation with RVR, DKA Performed by: Alvino Chapel   Total critical care time: 30 minutes  Critical care time was  exclusive of separately billable procedures and treating other patients.  Critical care  was necessary to treat or prevent imminent or life-threatening deterioration.  Critical care was time spent personally by me on the following activities: development of treatment plan with patient and/or surrogate as well as nursing, discussions with consultants, evaluation of patient's response to treatment, examination of patient, obtaining history from patient or surrogate, ordering and performing treatments and interventions, ordering and review of laboratory studies, ordering and review of radiographic studies, pulse oximetry and re-evaluation of patient's condition.   Initial Impression / Assessment and Plan / ED Course  I have reviewed the triage vital signs and the nursing notes.  Pertinent labs & imaging results that were available during my care of the patient were reviewed by me and considered in my medical decision making (see chart for details).  Clinical Course    61 year old male with a history of diabetes, COPD, hep C, depression who presents with concern for generalized weakness, nausea and vomiting, excessive thirst, chest pain and palpitations. Patient in atrial fibrillation with RVR and arrival to the emergency department, hyperglycemia, DKA and acute kidney injury.  Regarding atrial fibrillation, she is given IV fluid hydration, 10 mg of diltiazem with conversion back to a normal sinus rhythm. Patient without hypoxia, dyspnea, and have low suspicion for pulmonary embolus as etiology of his atrial fibrillation. Troponin is mildly elevated, likely secondary to atrial fibrillation with RVR. However suspicion for acute coronary syndrome overall by history, however will continue to monitor.  Patient reports chills, has leukocytosis. He is afebrile on exam, an overall suspect lactic acidosis, leukocytosis is an inflammatory response to DKA, however we'll cover patient with vanc/zosyn, do blood cx,  given his report of IV drug use 2 weeks ago. Lactate and AKI likely secondary to dehydration. Given 2L of NS followed by 125cc/hr.  Labs consistent with the combined anion gap metabolic acidosis with diabetic ketoacidosis and lactic acidosis. Patient was placed on insulin drip. He'll be admitted to the stepdown unit for further care.  Final Clinical Impressions(s) / ED Diagnoses   Final diagnoses:  Diabetic ketoacidosis without coma associated with type 1 diabetes mellitus ([name]  Atrial fibrillation with RVR ([name]  Leukocytosis, unspecified type  Lactic acidosis  Acute kidney injury [facility])    New Prescriptions New Prescriptions   No medications on file     Gareth Morgan, MD 04/18/16 [phone]

## 2016-04-18 NOTE — ED Triage Notes (Addendum)
Per EMS. Pt from home. Pt reports n/v, chills, and body aches for the past 4 days. Pt dry heaving in triage. CBG 394 with EMS. CBG too high for glucometer in ED to read.

## 2016-04-18 NOTE — Progress Notes (Signed)
Pharmacy Antibiotic Note  [name] is a 61 y.o. male admitted on 04/18/2016 with sepsis.  Pharmacy has been consulted for vancomycin and zosyn dosing. Lactic acid and SCr elevated at time of presentation.    Plan:  Vancomycin 1gm x 1, repeat 1gm x 1 in ~12h then 1gm IV q24h  Watch renal function closely  Trough as indicated based on length of therapy and renal fx  Zosyn 3.375gm over 69min x 1 in ED then 3.375gm IV q8h over 4h infusion  Height: 6' (182.9 cm) Weight: 165 lb (74.8 kg) IBW/kg (Calculated) : 77.6  Temp (24hrs), Avg:98.5 F (36.9 C), Min:97.9 F (36.6 C), Max:99.1 F (37.3 C)   Recent Labs Lab 04/18/16 1329 04/18/16 1346  CREATININE  --  2.40*  LATICACIDVEN 4.51*  --     Estimated Creatinine Clearance: 35.1 mL/min (by C-G formula based on SCr of 2.4 mg/dL (H)).    Allergies  Allergen Reactions  . Codeine Itching    Antimicrobials this admission: 1/4 >> vancomycin >> 1/4 >> zosyn >>  Dose adjustments this admission:  Microbiology results: 1/4 BCx  Thank you for allowing pharmacy to be a part of this patient's care.  Doreene Eland, PharmD, BCPS.   Pager: [phone] 04/18/2016 2:03 PM

## 2016-04-18 NOTE — ED Notes (Signed)
Bed: WA21 Expected date:  Expected time:  Means of arrival:  Comments: 

## 2016-04-18 NOTE — Progress Notes (Signed)
When reviewing chart on admission noticed Dr. Aileen Fass admission order entered at 1631 stated for [name], then at 1632 admission order states [facility].  Due to Dr. Aileen Fass no longer on, I paged Dr. Lily Kocher and discussed with her.  She advised to change admission order to [facility] and ok to keep patient here.  Barrie Folk ICU/SD Care Coordinator

## 2016-04-18 NOTE — H&P (Signed)
History and Physical    [name] TML:[id number] DOB: [birth year] DOA: 04/18/2016  PCP: [name], MD  Patient coming from: home  Chief Complaint: fever and polyurea  HPI: [name] is a 61 y.o. male with medical history significant of past medical history insulin-dependent diabetes mellitus with last hemoglobin A1c of 11, polysubstance abuse including heroin with his last heroin use about 2 weeks ago, crack cocaine about one week ago comes into the hospital as he started feeling bad about 4 days prior to admission, he relates polyuria polydipsia and subjective fever, the patient related he does not check his temperature he has been throwing up unable to keep anything down denies any chest pain shortness of breath or diarrhea. He relates no current or sores. He does admit to using IV heroin about 2 weeks prior to coming into the hospital.  ED Course:  He was given 2 L of normal saline started on normal saline infusion, along with IV insulin, blood cultures were drawn and started empirically on IV empiric antibiotics as per data fever head has a white count of 27,000. Lactic acid of 4.5 with an anion gap of 34 pseudohyponatremia and a blood glucose of greater than 700  Review of Systems: As per HPI otherwise 10 point review of systems negative.   Past Medical History:  Diagnosis Date  . DEPRESSION   . DIABETES MELLITUS, TYPE I   . DRUG ABUSE    pt should have NO controlled substances rx'ed  . Glaucoma   . HEPATITIS C    chronic  . Hypertension 02/18/2011  . 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 Vitiligo     Past Surgical History:  Procedure Laterality Date  . EYE SURGERY     retinal surgery x 2, right eye     reports that he has been smoking Cigarettes.  He has been smoking about 2.00 packs per day. He has never used smokeless tobacco. He reports that he drinks alcohol. He reports that he uses drugs, including "Crack" cocaine, Marijuana, and Heroin.  Allergies    Allergen Reactions  . Codeine Itching    Family History  Problem Relation Age of Onset  . Diabetes Father   . Kidney disease Father   . Arthritis Mother   . Heart disease Mother     CAD    Prior to Admission medications   Medication Sig Start Date End Date Taking? Authorizing Provider  insulin NPH Human (NOVOLIN N) 100 UNIT/ML injection Inject 50 Units into the skin daily before breakfast.    Yes Historical Provider, MD  gabapentin (NEURONTIN) 100 MG capsule Take one a night, after two nights increase to 2 caps at night, if tolerated increase to 3 caps at night Patient not taking: Reported on 04/18/2016 01/15/16   [name], MD    Physical Exam: Vitals:   04/18/16 1255 04/18/16 1258 04/18/16 1328 04/18/16 1347  BP:  140/84  129/92  Pulse:  60  (!) 153  Resp:  18  22  Temp:  99.1 F (37.3 C)  97.9 F (36.6 C)  TempSrc:  Oral  Rectal  SpO2: 100% 100%  100%  Weight:   74.8 kg (165 lb)   Height:   6' (1.829 m)       Constitutional: NAD, calm, comfortable Vitals:   04/18/16 1255 04/18/16 1258 04/18/16 1328 04/18/16 1347  BP:  140/84  129/92  Pulse:  60  (!) 153  Resp:  18  22  Temp:  99.1 F (37.3 C)  97.9 F (36.6 C)  TempSrc:  Oral  Rectal  SpO2: 100% 100%  100%  Weight:   74.8 kg (165 lb)   Height:   6' (1.829 m)    Eyes: PERRL, lids and conjunctivae normal, Cachectic ENMT: Mucous membranes are moist. Posterior pharynx clear of any exudate or lesions.Normal dentition.  Neck: normal, supple, no masses, no thyromegaly Respiratory: clear to auscultation bilaterally, no wheezing, no crackles. Normal respiratory effort. No accessory muscle use.  Cardiovascular: Tachycardic Regular rate,. No carotid bruits.  Abdomen: Mild diffuse tenderness, no masses palpated. No hepatosplenomegaly. Bowel sounds positive.  Musculoskeletal: no clubbing / cyanosis. No joint deformity upper and lower extremities. Good ROM, no contractures. Normal muscle tone.  Skin: no rashes,  lesions, ulcers. No induration, Vitiligo Neurologic: CN 2-12 grossly intact. Sensation intact, DTR normal. Strength 5/5 in all 4.  Psychiatric: Normal judgment and insight. Alert and oriented x 3. Normal mood.    Labs on Admission: I have personally reviewed following labs and imaging studies  CBC:  Recent Labs Lab 04/18/16 1324 04/18/16 1346  WBC 27.0*  --   HGB 15.8 17.3*  HCT 49.5 51.0  MCV 97.4  --   PLT 335  --    Basic Metabolic Panel:  Recent Labs Lab 04/18/16 1324 04/18/16 1346  NA 119* 118*  K 5.0 5.1  CL 72* 81*  CO2 13*  --   GLUCOSE 1,237* >700*  BUN 88* 81*  CREATININE 2.89* 2.40*  CALCIUM 8.7*  --    GFR: Estimated Creatinine Clearance: 35.1 mL/min (by C-G formula based on SCr of 2.4 mg/dL (H)). Liver Function Tests:  Recent Labs Lab 04/18/16 1324  AST 25  ALT 31  ALKPHOS 102  BILITOT 0.8  PROT 6.8  ALBUMIN 3.3*    Recent Labs Lab 04/18/16 1324  LIPASE 17   No results for input(s): AMMONIA in the last 168 hours. Coagulation Profile: No results for input(s): INR, PROTIME in the last 168 hours. Cardiac Enzymes: No results for input(s): CKTOTAL, CKMB, CKMBINDEX, TROPONINI in the last 168 hours. BNP (last 3 results) No results for input(s): PROBNP in the last 8760 hours. HbA1C: No results for input(s): HGBA1C in the last 72 hours. CBG:  Recent Labs Lab 04/18/16 1257 04/18/16 1438 04/18/16 1547  GLUCAP >600* >600* >600*   Lipid Profile: No results for input(s): CHOL, HDL, LDLCALC, TRIG, CHOLHDL, LDLDIRECT in the last 72 hours. Thyroid Function Tests: No results for input(s): TSH, T4TOTAL, FREET4, T3FREE, THYROIDAB in the last 72 hours. Anemia Panel: No results for input(s): VITAMINB12, FOLATE, FERRITIN, TIBC, IRON, RETICCTPCT in the last 72 hours. Urine analysis:    Component Value Date/Time   COLORURINE STRAW (A) 04/18/2016 1333   APPEARANCEUR CLEAR 04/18/2016 1333   LABSPEC 1.020 04/18/2016 1333   PHURINE 5.0 04/18/2016  1333   GLUCOSEU >=500 (A) 04/18/2016 1333   GLUCOSEU 500 04/28/2012 1149   HGBUR SMALL (A) 04/18/2016 1333   BILIRUBINUR NEGATIVE 04/18/2016 1333   BILIRUBINUR neg 04/28/2012 1053   KETONESUR 20 (A) 04/18/2016 1333   PROTEINUR 100 (A) 04/18/2016 1333   UROBILINOGEN 4.0 (H) 11/16/2013 1646   NITRITE NEGATIVE 04/18/2016 1333   LEUKOCYTESUR NEGATIVE 04/18/2016 1333   Sepsis Labs: !!!!!!!!!!!!!!!!!!!!!!!!!!!!!!!!!!!!!!!!!!!! @LABRCNTIP (procalcitonin:4,lacticidven:4) )No results found for this or any previous visit (from the past 240 hour(s)).   Radiological Exams on Admission: Dg Chest Portable 1 View  Result Date: 04/18/2016 CLINICAL DATA:  Atrial fibrillation EXAM: PORTABLE CHEST 1 VIEW COMPARISON:  02/09/2015 FINDINGS: Heart and mediastinal contours are within normal limits. No focal opacities or effusions. No acute bony abnormality. IMPRESSION: No active disease. Electronically Signed   By: Rolm Baptise M.D.   On: 04/18/2016 14:03    EKG: Independently reviewed. 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 I agree with IV insulin, through the DKA protocol. His gun 2 L of normal saline in the ED and will give him 2 additional liters of normal saline and continue him on 125 mL an hour. Check CBGs every hour and B mets per protocol. We'll follow strict I's and O's daily replacement by mouth. His lactic acid elevation could be secondary to DKA, see below for further details.  Lactic acidosis with SIRS (systemic inflammatory response syndrome) ([place]): Unclear etiology this could be a response to his DKA. But his leukocytosis is almost in the septic range, I agree with empiric antibiotics and blood cultures. Chest x-ray does not show any infiltrates, he denies any cough or shortness of breath. His UA does not  show signs of infection.  Diabetic neuropathy ([name] Opacification and no medications we'll continue to monitor.  AKI (acute kidney injury) ([place]): Likely prerenal in etiology, will start him on aggressive IV fluids and check regular B mets.  Hyponatremia: Likely pseudohyponatremia in the setting of such an elevation of his blood glucose. His corrected sodium was 132. We'll continue to check regular basic metabolic panels.  Hyperkalemia: Likely due to acidosis, this should correct as his acidosis improved. We'll continue to follow basic metabolic panels regularly.  Mild elevation in cardiac biomarkers The patient denies any chest pain or shortness of breath, will get a 2-D echo and EKG.  Erythrocytosis: Likely due to hemoconcentration will repeat in the morning  DVT prophylaxis: lovenox Code Status: full Family Communication: none Disposition Plan: d/c in 4-5days Consults called: none Admission status: inpatient   Charlynne Cousins MD [name] Pager [phone]  If 7PM-7AM, please contact night-coverage [email or web address] Password [facility]  04/18/2016, 4:37 PM

## 2016-04-18 NOTE — ED Notes (Signed)
Delay in taking pt upstairs d/t shift change.

## 2016-04-18 NOTE — Progress Notes (Signed)
UR Completed ([place])

## 2016-04-19 ENCOUNTER — Inpatient Hospital Stay (HOSPITAL_COMMUNITY): Payer: Medicare HMO

## 2016-04-19 DIAGNOSIS — R079 Chest pain, unspecified: Secondary | ICD-10-CM

## 2016-04-19 LAB — GLUCOSE, CAPILLARY
GLUCOSE-CAPILLARY: 126 mg/dL — AB (ref 65–99)
GLUCOSE-CAPILLARY: 131 mg/dL — AB (ref 65–99)
GLUCOSE-CAPILLARY: 143 mg/dL — AB (ref 65–99)
GLUCOSE-CAPILLARY: 163 mg/dL — AB (ref 65–99)
Glucose-Capillary: 102 mg/dL — ABNORMAL HIGH (ref 65–99)
Glucose-Capillary: 112 mg/dL — ABNORMAL HIGH (ref 65–99)
Glucose-Capillary: 124 mg/dL — ABNORMAL HIGH (ref 65–99)
Glucose-Capillary: 129 mg/dL — ABNORMAL HIGH (ref 65–99)
Glucose-Capillary: 153 mg/dL — ABNORMAL HIGH (ref 65–99)
Glucose-Capillary: 153 mg/dL — ABNORMAL HIGH (ref 65–99)
Glucose-Capillary: 157 mg/dL — ABNORMAL HIGH (ref 65–99)

## 2016-04-19 LAB — BASIC METABOLIC PANEL
ANION GAP: 7 (ref 5–15)
ANION GAP: 8 (ref 5–15)
ANION GAP: 8 (ref 5–15)
ANION GAP: 9 (ref 5–15)
BUN: 60 mg/dL — ABNORMAL HIGH (ref 6–20)
BUN: 53 mg/dL — ABNORMAL HIGH (ref 6–20)
BUN: 45 mg/dL — ABNORMAL HIGH (ref 6–20)
BUN: 37 mg/dL — ABNORMAL HIGH (ref 6–20)
CALCIUM: 7.9 mg/dL — AB (ref 8.9–10.3)
CALCIUM: 7.9 mg/dL — AB (ref 8.9–10.3)
CALCIUM: 8 mg/dL — AB (ref 8.9–10.3)
CALCIUM: 8 mg/dL — AB (ref 8.9–10.3)
CHLORIDE: 102 mmol/L (ref 101–111)
CO2: 27 mmol/L (ref 22–32)
CO2: 27 mmol/L (ref 22–32)
CO2: 25 mmol/L (ref 22–32)
CO2: 28 mmol/L (ref 22–32)
CREATININE: 1.14 mg/dL (ref 0.61–1.24)
CREATININE: 1.3 mg/dL — AB (ref 0.61–1.24)
CREATININE: 1.46 mg/dL — AB (ref 0.61–1.24)
CREATININE: 1.48 mg/dL — AB (ref 0.61–1.24)
Chloride: 104 mmol/L (ref 101–111)
Chloride: 104 mmol/L (ref 101–111)
Chloride: 102 mmol/L (ref 101–111)
GFR calc non Af Amer: 51 mL/min — ABNORMAL LOW (ref >60)
GFR calc non Af Amer: >60 mL/min (ref >60)
GFR, EST AFRICAN AMERICAN: 58 mL/min — AB (ref >60)
GFR, EST AFRICAN AMERICAN: 59 mL/min — AB (ref >60)
GFR, EST NON AFRICAN AMERICAN: 50 mL/min — AB (ref >60)
GFR, EST NON AFRICAN AMERICAN: 59 mL/min — AB (ref >60)
Glucose, Bld: 133 mg/dL — ABNORMAL HIGH (ref 65–99)
Glucose, Bld: 154 mg/dL — ABNORMAL HIGH (ref 65–99)
Glucose, Bld: 225 mg/dL — ABNORMAL HIGH (ref 65–99)
Glucose, Bld: 203 mg/dL — ABNORMAL HIGH (ref 65–99)
Potassium: 3.6 mmol/L (ref 3.5–5.1)
Potassium: 3.8 mmol/L (ref 3.5–5.1)
Potassium: 3.5 mmol/L (ref 3.5–5.1)
Potassium: 3.4 mmol/L — ABNORMAL LOW (ref 3.5–5.1)
SODIUM: 140 mmol/L (ref 135–145)
SODIUM: 137 mmol/L (ref 135–145)
SODIUM: 135 mmol/L (ref 135–145)
Sodium: 139 mmol/L (ref 135–145)

## 2016-04-19 LAB — LACTIC ACID, PLASMA: Lactic Acid, Venous: 1.7 mmol/L (ref 0.5–1.9)

## 2016-04-19 LAB — ECHOCARDIOGRAM COMPLETE
HEIGHTINCHES: 72 in
WEIGHTICAEL: 2617.3 [oz_av]

## 2016-04-19 LAB — CBC
HCT: 39.7 % (ref 39.0–52.0)
HEMOGLOBIN: 14.4 g/dL (ref 13.0–17.0)
MCH: 31.8 pg (ref 26.0–34.0)
MCHC: 36.3 g/dL — AB (ref 30.0–36.0)
MCV: 87.6 fL (ref 78.0–100.0)
PLATELETS: 262 10*3/uL (ref 150–400)
RBC: 4.53 MIL/uL (ref 4.22–5.81)
RDW: 12.9 % (ref 11.5–15.5)
WBC: 20.6 10*3/uL — ABNORMAL HIGH (ref 4.0–10.5)

## 2016-04-19 MED ORDER — INSULIN NPH (HUMAN) (ISOPHANE) 100 UNIT/ML ~~LOC~~ SUSP
25.0000 [IU] | Freq: Two times a day (BID) | SUBCUTANEOUS | Status: DC
  Administered 2016-04-19 – 2016-04-21 (×4): 25 [IU] via SUBCUTANEOUS
  Filled 2016-04-19 (×2): qty 10

## 2016-04-19 MED ORDER — INSULIN DETEMIR 100 UNIT/ML ~~LOC~~ SOLN
15.0000 [IU] | SUBCUTANEOUS | Status: DC
  Administered 2016-04-19: 15 [IU] via SUBCUTANEOUS
  Filled 2016-04-19: qty 0.15

## 2016-04-19 MED ORDER — SODIUM CHLORIDE 0.9 % IV SOLN
INTRAVENOUS | Status: AC
  Administered 2016-04-19: 08:00:00 via INTRAVENOUS

## 2016-04-19 MED ORDER — ALUM & MAG HYDROXIDE-SIMETH 200-200-20 MG/5ML PO SUSP
30.0000 mL | ORAL | Status: DC | PRN
  Administered 2016-04-19 – 2016-04-21 (×10): 30 mL via ORAL
  Filled 2016-04-19 (×10): qty 30

## 2016-04-19 MED ORDER — HYDRALAZINE HCL 20 MG/ML IJ SOLN
5.0000 mg | Freq: Four times a day (QID) | INTRAMUSCULAR | Status: DC | PRN
  Administered 2016-04-19 – 2016-04-20 (×2): 5 mg via INTRAVENOUS
  Filled 2016-04-19 (×2): qty 1

## 2016-04-19 MED ORDER — INSULIN ASPART 100 UNIT/ML ~~LOC~~ SOLN
0.0000 [IU] | Freq: Three times a day (TID) | SUBCUTANEOUS | Status: DC
  Administered 2016-04-19: 3 [IU] via SUBCUTANEOUS
  Administered 2016-04-19: 2 [IU] via SUBCUTANEOUS

## 2016-04-19 MED ORDER — INSULIN ASPART 100 UNIT/ML ~~LOC~~ SOLN
0.0000 [IU] | SUBCUTANEOUS | Status: DC
  Administered 2016-04-19: 3 [IU] via SUBCUTANEOUS

## 2016-04-19 MED ORDER — GUAIFENESIN 100 MG/5ML PO SOLN
5.0000 mL | ORAL | Status: DC | PRN
  Administered 2016-04-19 – 2016-04-20 (×3): 100 mg via ORAL
  Filled 2016-04-19 (×3): qty 10

## 2016-04-19 MED ORDER — CALCIUM CARBONATE ANTACID 500 MG PO CHEW
1.0000 | CHEWABLE_TABLET | Freq: Once | ORAL | Status: AC
  Administered 2016-04-19: 200 mg via ORAL
  Filled 2016-04-19: qty 1

## 2016-04-19 MED ORDER — POTASSIUM CHLORIDE CRYS ER 20 MEQ PO TBCR
40.0000 meq | EXTENDED_RELEASE_TABLET | Freq: Two times a day (BID) | ORAL | Status: AC
  Administered 2016-04-19 (×2): 40 meq via ORAL
  Filled 2016-04-19 (×2): qty 2

## 2016-04-19 MED ORDER — INSULIN ASPART 100 UNIT/ML ~~LOC~~ SOLN: 0.0000 [IU] | Freq: Every day | SUBCUTANEOUS | Status: DC

## 2016-04-19 MED ORDER — INSULIN ASPART 100 UNIT/ML ~~LOC~~ SOLN: 0.0000 [IU] | Freq: Three times a day (TID) | SUBCUTANEOUS | Status: DC

## 2016-04-19 MED ORDER — INSULIN ASPART 100 UNIT/ML ~~LOC~~ SOLN
2.0000 [IU] | Freq: Once | SUBCUTANEOUS | Status: AC
  Administered 2016-04-19: 2 [IU] via SUBCUTANEOUS

## 2016-04-19 MED ORDER — INSULIN ASPART 100 UNIT/ML ~~LOC~~ SOLN
3.0000 [IU] | Freq: Three times a day (TID) | SUBCUTANEOUS | Status: DC
  Administered 2016-04-19 (×2): 3 [IU] via SUBCUTANEOUS

## 2016-04-19 NOTE — Progress Notes (Signed)
Explained to the pt that his current diet order is nothing by mouth. Pt repeatedly asking for something to drink after diet order was explained multiple times. Pt began to curse stating that nursing staff was being "racist". Pt offered mouth swabs, but he refused the swabs at this time. Charge nurse, Gilmer Mor, notified.

## 2016-04-19 NOTE — Progress Notes (Signed)
  Echocardiogram 2D Echocardiogram has been performed.  [name] 04/19/2016, 10:09 AM

## 2016-04-19 NOTE — Progress Notes (Signed)
[name] PROGRESS NOTE    Progress Note  [name]  DJM:[id number] DOB: [birth year] DOA: 04/18/2016 PCP: [name], MD     Brief Narrative:   [name] is an 61 y.o. male  with medical history significant of past medical history insulin-dependent diabetes mellitus with last hemoglobin A1c of 11, polysubstance abuse including heroin with his last heroin use about 2 weeks ago, crack cocaine about one week ago comes into the hospital as he started feeling bad about 4 days prior to admission, he relates polyuria polydipsia and subjective fever, come in with DKA.  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 A1c in this admission is pending. AG has closed, HCO > 20, now on long acting insulin plus SSI. Change CBG's to ACHS.. This is likely due to non compliance. Transfer to med-surg  Lactic acidosis with SIRS (systemic inflammatory response syndrome) ([place]): Unclear etiology this could be a response to his DKA. Cont. empiric antibiotics and blood cultures pending. He is an IVDA, BP has remained stable. Repeat lactic acid.  Diabetic neuropathy ([place]) On no medications we'll continue to monitor. Pain is control.  AKI (acute kidney injury) ([place]): Likely prerenal in etiology, improving, cont IV fluids.  Hyponatremia: Likely pseudohyponatremia in the setting of such an elevation of his blood glucose. Now resolved.  Hyperkalemia: Resolved, Likely due to acidosis.  Mild elevation in cardiac biomarkers The patient denies any chest pain or shortness of breath, will get a 2-D echo pending  Erythrocytosis: Now back to baseline, likely due to hemoconcentration.   DVT prophylaxis: lovenox Family Communication:none Disposition Plan/Barrier to D/C: home in 1 days Code Status:     Code Status Orders        Start     Ordered   04/18/16 2003  Full code   Continuous     04/18/16 2002    Code Status History    Date Active Date Inactive Code Status Order ID Comments User Context   04/18/2016  8:03 PM 04/18/2016  8:03 PM Full Code [id number]  Charlynne Cousins, MD Inpatient        IV Access:    Peripheral IV   Procedures and diagnostic studies:   Dg Chest Portable 1 View  Result Date: 04/18/2016 CLINICAL DATA:  Atrial fibrillation EXAM: PORTABLE CHEST 1 VIEW COMPARISON:  02/09/2015 FINDINGS: Heart and mediastinal contours are within normal limits. No focal opacities or effusions. No acute bony abnormality. IMPRESSION: No active disease. Electronically Signed   By: Rolm Baptise M.D.   On: 04/18/2016 14:03     Medical Consultants:    None.  Anti-Infectives:   Vanc and cefepeme  Subjective:    [name] feels better, no CP or SOB tolerating diet.  Objective:    Vitals:   04/19/16 0000 04/19/16 0200 04/19/16 0400 04/19/16 0600  BP: (!) 158/37 (!) 168/50 (!) 179/65 (!) 169/41  Pulse: 86 82 80 80  Resp: 12 15 12 20   Temp: 97.9 F (36.6 C)  99.2 F (37.3 C)   TempSrc: Oral  Oral   SpO2: 95% 96% 96% 98%  Weight:      Height:        Intake/Output Summary (Last 24 hours) at 04/19/16 0741 Last data filed at 04/19/16 0700  Gross per 24 hour  Intake           6017.5 ml  Output              875 ml  Net           5142.5 ml   Filed Weights   04/18/16 1328 04/18/16 2005  Weight: 74.8 kg (165 lb) 74.2 kg (163 lb 9.3 oz)    Exam: General exam: In no acute distress. Respiratory system: Good air movement and clear to auscultation. Cardiovascular system: S1 & S2 heard, RRR. No JVD. Gastrointestinal system: Abdomen is nondistended, soft and nontender.  Central nervous system: Alert and oriented. No focal neurological deficits. Extremities: No pedal edema. Skin: No rashes, lesions or ulcers Psychiatry: Judgement and insight appear normal. Mood & affect appropriate.    Data Reviewed:    Labs: Basic Metabolic  Panel:  Recent Labs Lab 04/18/16 1324 04/18/16 1346 04/18/16 2010 04/19/16 0030 04/19/16 0415  NA 119* 118* 133* 139 140  K 5.0 5.1 3.6 3.6 3.8  CL 72* 81* 96* 104 104  CO2 13*  --  26 28 27   GLUCOSE 1,237* >700* 437* 133* 154*  BUN 88* 81* 69* 60* 53*  CREATININE 2.89* 2.40* 1.93* 1.46* 1.48*  CALCIUM 8.7*  --  7.9* 8.0* 8.0*  MG  --   --  2.2  --   --   PHOS  --   --  1.6*  --   --    GFR Estimated Creatinine Clearance: 56.4 mL/min (by C-G formula based on SCr of 1.48 mg/dL (H)). Liver Function Tests:  Recent Labs Lab 04/18/16 1324  AST 25  ALT 31  ALKPHOS 102  BILITOT 0.8  PROT 6.8  ALBUMIN 3.3*    Recent Labs Lab 04/18/16 1324  LIPASE 17   No results for input(s): AMMONIA in the last 168 hours. Coagulation profile No results for input(s): INR, PROTIME in the last 168 hours.  CBC:  Recent Labs Lab 04/18/16 1324 04/18/16 1346 04/18/16 2010 04/19/16 0415  WBC 27.0*  --  24.3* 20.6*  HGB 15.8 17.3* 15.1 14.4  HCT 49.5 51.0 41.7 39.7  MCV 97.4  --  87.2 87.6  PLT 335  --  280 262   Cardiac Enzymes: No results for input(s): CKTOTAL, CKMB, CKMBINDEX, TROPONINI in the last 168 hours. BNP (last 3 results) No results for input(s): PROBNP in the last 8760 hours. CBG:  Recent Labs Lab 04/19/16 0136 04/19/16 0236 04/19/16 0341 04/19/16 0419 04/19/16 0528  GLUCAP 112* 102* 124* 131* 143*   D-Dimer: No results for input(s): DDIMER in the last 72 hours. Hgb A1c: No results for input(s): HGBA1C in the last 72 hours. Lipid Profile: No results for input(s): CHOL, HDL, LDLCALC, TRIG, CHOLHDL, LDLDIRECT in the last 72 hours. Thyroid function studies: No results for input(s): TSH, T4TOTAL, T3FREE, THYROIDAB in the last 72 hours.  Invalid input(s): FREET3 Anemia work up: No results for input(s): VITAMINB12, FOLATE, FERRITIN, TIBC, IRON, RETICCTPCT in the last 72 hours. Sepsis Labs:  Recent Labs Lab 04/18/16 1324 04/18/16 1329 04/18/16 2010  04/19/16 0415  WBC 27.0*  --  24.3* 20.6*  LATICACIDVEN  --  4.51*  --   --    Microbiology Recent Results (from the past 240 hour(s))  Blood culture (routine x 2)     Status: None (Preliminary result)   Collection Time: 04/18/16  1:58 PM  Result Value Ref Range Status   Specimen Description   Final    BLOOD LEFT HAND Performed at [facility]  ANAEROBIC 5 CC  Final   Culture PENDING  Incomplete   Report Status PENDING  Incomplete  MRSA PCR Screening     Status: None   Collection Time: 04/18/16  8:02 PM  Result Value Ref Range Status   MRSA by PCR NEGATIVE NEGATIVE Final    Comment:        The GeneXpert MRSA Assay (FDA approved for NASAL specimens only), is one component of a comprehensive MRSA colonization surveillance program. It is not intended to diagnose MRSA infection nor to guide or monitor treatment for MRSA infections.      Medications:   . enoxaparin (LOVENOX) injection  40 mg Subcutaneous Q24H  . insulin aspart  0-5 Units Subcutaneous QHS  . insulin aspart  0-9 Units Subcutaneous TID WC  . insulin aspart  3 Units Subcutaneous TID WC  . insulin detemir  15 Units Subcutaneous Q24H  . piperacillin-tazobactam (ZOSYN)  IV  3.375 g Intravenous Q8H  . sodium chloride flush  3 mL Intravenous Q12H  . vancomycin  1,000 mg Intravenous Q24H   Continuous Infusions: . dextrose 5 % and 0.45% NaCl Stopped (04/19/16 0715)    Time spent: 25 min   LOS: 1 day   Charlynne Cousins  [name] Pager [phone]  *Please refer to [email or web address], password [email or web address] to get updated schedule on who will round on this patient, as hospitalists switch teams weekly. If 7PM-7AM, please contact night-coverage at [email or web address], password [email or web address] for any overnight needs.  04/19/2016, 7:41 AM

## 2016-04-20 LAB — BASIC METABOLIC PANEL
ANION GAP: 5 (ref 5–15)
BUN: 23 mg/dL — ABNORMAL HIGH (ref 6–20)
CALCIUM: 8.1 mg/dL — AB (ref 8.9–10.3)
CO2: 29 mmol/L (ref 22–32)
CREATININE: 1.04 mg/dL (ref 0.61–1.24)
Chloride: 103 mmol/L (ref 101–111)
GFR calc non Af Amer: >60 mL/min (ref >60)
GLUCOSE: 81 mg/dL (ref 65–99)
Potassium: 4.3 mmol/L (ref 3.5–5.1)
Sodium: 137 mmol/L (ref 135–145)

## 2016-04-20 LAB — CBC
HEMATOCRIT: 40.9 % (ref 39.0–52.0)
Hemoglobin: 14.4 g/dL (ref 13.0–17.0)
MCH: 31.5 pg (ref 26.0–34.0)
MCHC: 35.2 g/dL (ref 30.0–36.0)
MCV: 89.5 fL (ref 78.0–100.0)
PLATELETS: 206 10*3/uL (ref 150–400)
RBC: 4.57 MIL/uL (ref 4.22–5.81)
RDW: 13 % (ref 11.5–15.5)
WBC: 10.3 10*3/uL (ref 4.0–10.5)

## 2016-04-20 LAB — HEMOGLOBIN A1C
Hgb A1c MFr Bld: 8.4 % — ABNORMAL HIGH (ref 4.8–5.6)
MEAN PLASMA GLUCOSE: 194 mg/dL

## 2016-04-20 LAB — GLUCOSE, CAPILLARY
GLUCOSE-CAPILLARY: 105 mg/dL — AB (ref 65–99)
GLUCOSE-CAPILLARY: 155 mg/dL — AB (ref 65–99)
GLUCOSE-CAPILLARY: 248 mg/dL — AB (ref 65–99)
GLUCOSE-CAPILLARY: 88 mg/dL (ref 65–99)
Glucose-Capillary: 73 mg/dL (ref 65–99)
Glucose-Capillary: 247 mg/dL — ABNORMAL HIGH (ref 65–99)

## 2016-04-20 MED ORDER — INSULIN ASPART 100 UNIT/ML ~~LOC~~ SOLN
0.0000 [IU] | Freq: Three times a day (TID) | SUBCUTANEOUS | Status: DC
  Administered 2016-04-20 (×2): 5 [IU] via SUBCUTANEOUS
  Administered 2016-04-21: 2 [IU] via SUBCUTANEOUS

## 2016-04-20 MED ORDER — PANTOPRAZOLE SODIUM 40 MG PO TBEC
40.0000 mg | DELAYED_RELEASE_TABLET | Freq: Every day | ORAL | Status: DC
  Administered 2016-04-20 – 2016-04-21 (×2): 40 mg via ORAL
  Filled 2016-04-20 (×2): qty 1

## 2016-04-20 MED ORDER — VANCOMYCIN HCL IN DEXTROSE 1-5 GM/200ML-% IV SOLN: 1000.0000 mg | Freq: Two times a day (BID) | INTRAVENOUS | Status: DC

## 2016-04-20 MED ORDER — INSULIN ASPART 100 UNIT/ML ~~LOC~~ SOLN: 0.0000 [IU] | Freq: Every day | SUBCUTANEOUS | Status: DC

## 2016-04-20 MED ORDER — INSULIN ASPART 100 UNIT/ML ~~LOC~~ SOLN
4.0000 [IU] | Freq: Three times a day (TID) | SUBCUTANEOUS | Status: DC
  Administered 2016-04-20 – 2016-04-21 (×2): 4 [IU] via SUBCUTANEOUS

## 2016-04-20 NOTE — Progress Notes (Signed)
Pharmacy Antibiotic Note  [name] is a 60 y.o. male admitted on 04/18/2016 with sepsis.  Pharmacy has been consulted for vancomycin and zosyn dosing. Lactic acid and SCr elevated at time of presentation.    Today, 04/20/2016  Day #3 Vancomycin/Zosyn Tmax 99.2 WBC 10.6 (1/5) SCr improving 1.04, CrCl ~80 ml/min Blood cultures negative to date  Plan:  Increase vancomycin to 1g IV q12h  Check trough at steady state, goal 15-20 mcg/ml  Zosyn 3.375gm IV q8h (4hr extended infusions)  Follow up renal function & cultures, duration of therapy  Height: 6' (182.9 cm) Weight: 163 lb (73.9 kg) IBW/kg (Calculated) : 77.6  Temp (24hrs), Avg:98.4 F (36.9 C), Min:97.7 F (36.5 C), Max:99 F (37.2 C)   Recent Labs Lab 04/18/16 1324 04/18/16 1329  04/18/16 2010 04/19/16 0030 04/19/16 0415 04/19/16 0822 04/19/16 1242 04/20/16 0441  WBC 27.0*  --   --  24.3*  --  20.6*  --   --   --   CREATININE 2.89*  --   < > 1.93* 1.46* 1.48* 1.30* 1.14 1.04  LATICACIDVEN  --  4.51*  --   --   --   --  1.7  --   --   < > = values in this interval not displayed.  Estimated Creatinine Clearance: 79.9 mL/min (by C-G formula based on SCr of 1.04 mg/dL).    Allergies  Allergen Reactions  . Codeine Itching    Antimicrobials this admission: 1/4 >> vancomycin >> 1/4 >> zosyn >>   Dose adjustments this admission: 1/6 increase vanc 1g q12h for improved SCr   Microbiology results: 1/4 BCx: NGTD 1/4 MRSA PCR: negative  Thank you for allowing pharmacy to be a part of this patient's care.  Peggyann Juba, PharmD, BCPS Pager: [phone] 04/20/2016 9:28 AM

## 2016-04-20 NOTE — Progress Notes (Signed)
Pt up to bathroom - brown colored medium sized stool noted in toilet. In reference to patient reports of "black stools" this morning - patient states "yes its clearing up now." Spoke with NT's on this shift and previous RN from night shift - they all deny that patient has had black stools and report that they were brown.

## 2016-04-20 NOTE — Progress Notes (Signed)
[name] PROGRESS NOTE    Progress Note  MARSHA HILLMAN  VZD:[id number] DOB: Jul 21, 1955 DOA: 04/18/2016 PCP: [name], MD     Brief Narrative:   SAID [name] is an 61 y.o. male  with medical history significant of past medical history insulin-dependent diabetes mellitus with last hemoglobin A1c of 11, polysubstance abuse including heroin with his last heroin use about 2 weeks ago, crack cocaine about one week ago comes into the hospital as he started feeling bad about 4 days prior to admission, he relates polyuria polydipsia and subjective fever, come in with DKA.  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 Continue long-acting insulin plus sliding scale. Change CBG's to ACHS.[name] Unclear etiology of DKA.  Lactic acidosis with SIRS (systemic inflammatory response syndrome) ([place]): Unclear etiology this could be a response to his DKA. Cont. empiric antibiotics and blood cultures pending. He is an IVDA, BP has remained stable. Repeat lactic acid.  Diabetic neuropathy ([place]): On no medications we'll continue to monitor. Pain is control.  AKI (acute kidney injury) ([place]): Likely prerenal in etiology, improving, cont IV fluids.  Hyponatremia: Likely pseudohyponatremia in the setting of such an elevation of his blood glucose. Now resolved.  Hyperkalemia: Resolved, Likely due to acidosis.  Mild elevation in cardiac biomarkers The patient denies any chest pain or shortness of breath, will get a 2-D echo no Blowers motion, EF 29% no diastolic heart failure.  Erythrocytosis: Now back to baseline, likely due to hemoconcentration.  Melanotic stools: FOBT stools, check a CBC, start IV fluids  DVT prophylaxis: lovenox Family Communication:none Disposition Plan/Barrier to D/C: home in 1 days Code Status:     Code Status Orders        Start     Ordered   04/18/16 2003  Full code  Continuous       04/18/16 2002    Code Status History    Date Active Date Inactive Code Status Order ID Comments User Context   04/18/2016  8:03 PM 04/18/2016  8:03 PM Full Code [id number]  Charlynne Cousins, MD Inpatient        IV Access:    Peripheral IV   Procedures and diagnostic studies:   Dg Chest Portable 1 View  Result Date: 04/18/2016 CLINICAL DATA:  Atrial fibrillation EXAM: PORTABLE CHEST 1 VIEW COMPARISON:  02/09/2015 FINDINGS: Heart and mediastinal contours are within normal limits. No focal opacities or effusions. No acute bony abnormality. IMPRESSION: No active disease. Electronically Signed   By: Rolm Baptise M.D.   On: 04/18/2016 14:03     Medical Consultants:    None.  Anti-Infectives:     Subjective:    Mariam Dollar patient is not tolerating diet he's complaining of nausea he relates he had a  3 melanotic stools.  Objective:    Vitals:   04/19/16 1139 04/19/16 2008 04/19/16 2356 04/20/16 0359  BP: (!) 162/63 (!) 151/65 (!) 168/77 (!) 173/85  Pulse: 76 79 79 81  Resp: 16 14 14 13   Temp: 97.7 F (36.5 C) 98.3 F (36.8 C) 99 F (37.2 C) 98.5 F (36.9 C)  TempSrc: Oral Oral Oral Oral  SpO2: 98% 96% 98% 99%  Weight: 73.9 kg (163 lb)     Height:        Intake/Output Summary (Last 24 hours) at 04/20/16 1218 Last data filed at 04/20/16 0600  Gross per 24 hour  Intake  4048 ml  Output              200 ml  Net             3848 ml   Filed Weights   04/18/16 1328 04/18/16 2005 04/19/16 1139  Weight: 74.8 kg (165 lb) 74.2 kg (163 lb 9.3 oz) 73.9 kg (163 lb)    Exam: General exam: In no acute distress. Respiratory system: Good air movement and clear to auscultation. Cardiovascular system: S1 & S2 heard, RRR. No JVD. Gastrointestinal system: Abdomen is nondistended, soft and nontender.  Extremities: No pedal edema. Skin: No rashes, lesions or ulcers, vitiligo. Psychiatry: Judgement and insight appear normal. Mood & affect appropriate.    Data  Reviewed:    Labs: Basic Metabolic Panel:  Recent Labs Lab 04/18/16 2010 04/19/16 0030 04/19/16 0415 04/19/16 0822 04/19/16 1242 04/20/16 0441  NA 133* 139 140 137 135 137  K 3.6 3.6 3.8 3.5 3.4* 4.3  CL 96* 104 104 102 102 103  CO2 26 28 27 27 25 29   GLUCOSE 437* 133* 154* 225* 203* 81  BUN 69* 60* 53* 45* 37* 23*  CREATININE 1.93* 1.46* 1.48* 1.30* 1.14 1.04  CALCIUM 7.9* 8.0* 8.0* 7.9* 7.9* 8.1*  MG 2.2  --   --   --   --   --   PHOS 1.6*  --   --   --   --   --    GFR Estimated Creatinine Clearance: 79.9 mL/min (by C-G formula based on SCr of 1.04 mg/dL). Liver Function Tests:  Recent Labs Lab 04/18/16 1324  AST 25  ALT 31  ALKPHOS 102  BILITOT 0.8  PROT 6.8  ALBUMIN 3.3*    Recent Labs Lab 04/18/16 1324  LIPASE 17   No results for input(s): AMMONIA in the last 168 hours. Coagulation profile No results for input(s): INR, PROTIME in the last 168 hours.  CBC:  Recent Labs Lab 04/18/16 1324 04/18/16 1346 04/18/16 2010 04/19/16 0415  WBC 27.0*  --  24.3* 20.6*  HGB 15.8 17.3* 15.1 14.4  HCT 49.5 51.0 41.7 39.7  MCV 97.4  --  87.2 87.6  PLT 335  --  280 262   Cardiac Enzymes: No results for input(s): CKTOTAL, CKMB, CKMBINDEX, TROPONINI in the last 168 hours. BNP (last 3 results) No results for input(s): PROBNP in the last 8760 hours. CBG:  Recent Labs Lab 04/19/16 2002 04/19/16 2354 04/20/16 0356 04/20/16 0728 04/20/16 1128  GLUCAP 153* 129* 73 105* 248*   D-Dimer: No results for input(s): DDIMER in the last 72 hours. Hgb A1c:  Recent Labs  04/18/16 2010  HGBA1C 8.4*   Lipid Profile: No results for input(s): CHOL, HDL, LDLCALC, TRIG, CHOLHDL, LDLDIRECT in the last 72 hours. Thyroid function studies: No results for input(s): TSH, T4TOTAL, T3FREE, THYROIDAB in the last 72 hours.  Invalid input(s): FREET3 Anemia work up: No results for input(s): VITAMINB12, FOLATE, FERRITIN, TIBC, IRON, RETICCTPCT in the last 72 hours. Sepsis  Labs:  Recent Labs Lab 04/18/16 1324 04/18/16 1329 04/18/16 2010 04/19/16 0415 04/19/16 0822  WBC 27.0*  --  24.3* 20.6*  --   LATICACIDVEN  --  4.51*  --   --  1.7   Microbiology Recent Results (from the past 240 hour(s))  Blood culture (routine x 2)     Status: None (Preliminary result)   Collection Time: 04/18/16  1:57 PM  Result Value Ref Range Status   Specimen Description BLOOD LEFT ANTECUBITAL  Final   Special Requests BOTTLES DRAWN AEROBIC AND ANAEROBIC 5 CC  Final   Culture   Final    NO GROWTH 2 DAYS Performed at [facility]    Report Status PENDING  Incomplete  Blood culture (routine x 2)     Status: None (Preliminary result)   Collection Time: 04/18/16  1:58 PM  Result Value Ref Range Status   Specimen Description BLOOD LEFT HAND  Final   Special Requests BOTTLES DRAWN AEROBIC AND ANAEROBIC 5 CC  Final   Culture   Final    NO GROWTH 2 DAYS Performed at [facility]    Report Status PENDING  Incomplete  MRSA PCR Screening     Status: None   Collection Time: 04/18/16  8:02 PM  Result Value Ref Range Status   MRSA by PCR NEGATIVE NEGATIVE Final    Comment:        The GeneXpert MRSA Assay (FDA approved for NASAL specimens only), is one component of a comprehensive MRSA colonization surveillance program. It is not intended to diagnose MRSA infection nor to guide or monitor treatment for MRSA infections.      Medications:   . insulin aspart  0-15 Units Subcutaneous TID WC  . insulin aspart  0-5 Units Subcutaneous QHS  . insulin aspart  4 Units Subcutaneous TID WC  . insulin NPH Human  25 Units Subcutaneous BID AC & HS  . sodium chloride flush  3 mL Intravenous Q12H   Continuous Infusions:   Time spent: 25 min   LOS: 2 days   Charlynne Cousins  [name] Pager [phone]  *Please refer to [email or web address], password [email or web address] to get updated schedule on who will round on this patient, as hospitalists switch teams weekly. If  7PM-7AM, please contact night-coverage at [email or web address], password [email or web address] for any overnight needs.  04/20/2016, 12:18 PM

## 2016-04-21 DIAGNOSIS — E1042 Type 1 diabetes mellitus with diabetic polyneuropathy: Secondary | ICD-10-CM

## 2016-04-21 LAB — CBC
HCT: 39.2 % (ref 39.0–52.0)
Hemoglobin: 13.6 g/dL (ref 13.0–17.0)
MCH: 30.4 pg (ref 26.0–34.0)
MCHC: 34.7 g/dL (ref 30.0–36.0)
MCV: 87.7 fL (ref 78.0–100.0)
Platelets: 191 10*3/uL (ref 150–400)
RBC: 4.47 MIL/uL (ref 4.22–5.81)
RDW: 12.6 % (ref 11.5–15.5)
WBC: 9.6 10*3/uL (ref 4.0–10.5)

## 2016-04-21 LAB — BASIC METABOLIC PANEL
Anion gap: 5 (ref 5–15)
BUN: 15 mg/dL (ref 6–20)
CO2: 27 mmol/L (ref 22–32)
CREATININE: 1.02 mg/dL (ref 0.61–1.24)
Calcium: 8 mg/dL — ABNORMAL LOW (ref 8.9–10.3)
Chloride: 104 mmol/L (ref 101–111)
GFR calc Af Amer: >60 mL/min (ref >60)
GFR calc non Af Amer: >60 mL/min (ref >60)
Glucose, Bld: 137 mg/dL — ABNORMAL HIGH (ref 65–99)
POTASSIUM: 3.5 mmol/L (ref 3.5–5.1)
SODIUM: 136 mmol/L (ref 135–145)

## 2016-04-21 LAB — GLUCOSE, CAPILLARY
GLUCOSE-CAPILLARY: 70 mg/dL (ref 65–99)
Glucose-Capillary: 46 mg/dL — ABNORMAL LOW (ref 65–99)
Glucose-Capillary: 116 mg/dL — ABNORMAL HIGH (ref 65–99)
Glucose-Capillary: 135 mg/dL — ABNORMAL HIGH (ref 65–99)

## 2016-04-21 MED ORDER — INSULIN NPH (HUMAN) (ISOPHANE) 100 UNIT/ML ~~LOC~~ SUSP: 25.0000 [IU] | Freq: Two times a day (BID) | SUBCUTANEOUS | 11 refills | Status: DC

## 2016-04-21 NOTE — Discharge Summary (Signed)
Physician Discharge Summary  CONO GEBHARD ZYS:[id number] DOB: January 09, 1956 DOA: 04/18/2016  PCP: [name], MD  Admit date: 04/18/2016 Discharge date: 04/21/2016  Admitted From: home Disposition:  Home  Recommendations for Outpatient Follow-up:  1. Follow up with PCP in 1-2 weeks 2. Please obtain BMP/CBC in one week 3.   Home Health:No Equipment/Devices:none  Discharge Condition:stable CODE STATUS:full Diet recommendation: Carb Modified   Brief/Interim Summary: 61 y.o. male with medical history significant of past medical history insulin-dependent diabetes mellitus with last hemoglobin A1c of 11, polysubstance abuse including heroin with his last heroin use about 2 weeks ago, crack cocaine about one week ago comes into the hospital as he started feeling bad about 4 days prior to admission, he relates polyuria polydipsia and subjective fever, come in with DKA.  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 Started on IV drip his anion gap closes bicarbonate was greeted and 20 he was transitioned to insulin and his blood glucose was controlled. Neuro: 170 3025 units twice a day to follow-up with his PCP to titrate his morning 7030 as needed.  Lactic acidosis with SIRS (systemic inflammatory response syndrome) ([place]): Unclear etiology this could be a response to his DKA. He was started empirically on IV antibiotics is with EDC 24 prior to admission he remained afebrile blood cultures are negative.  Diabetic neuropathy  ([place]): Continue Neurontin. Pain is control.  AKI (acute kidney injury) ([place]): Likely prerenal in etiology, resolved with IV fluid hydration  Hyponatremia: Likely pseudohyponatremia, due to hyperglycemia Now resolved.  Hyperkalemia: Resolved, Likely due to acidosis.  Mild elevation in cardiac biomarkers The patient denies any chest pain or shortness of breath, will get a 2-D echo no Davtyan motion, EF 93% no diastolic heart failure.  Erythrocytosis: Now back to baseline, likely due to hemoconcentration.  Patient complaining of dark stools: The patient was complaining of melanotic stools, aspirin as he had several bowel movements and they were not black, his hemoglobin remained stable.  Discharge Instructions  Discharge Instructions    Diet - low sodium heart healthy    Complete by:  As directed    Increase activity slowly    Complete by:  As directed      Allergies as of 04/21/2016      Reactions   Codeine Itching      Medication List    TAKE these medications   gabapentin 100 MG capsule Commonly known as:  NEURONTIN Take one a night, after two nights increase to 2 caps at night, if tolerated increase to 3 caps at night   insulin NPH Human 100 UNIT/ML injection Commonly known as:  NOVOLIN N Inject 0.25 mLs (25 Units total) into the skin 2 (two) times daily at 8 am and 10 pm. What changed:  how much to take  when to take this       Allergies  Allergen Reactions  . Codeine Itching    Consultations:  None   Procedures/Studies: Dg Chest Portable 1 View  Result Date: 04/18/2016 CLINICAL DATA:  Atrial fibrillation EXAM: PORTABLE  CHEST 1 VIEW COMPARISON:  02/09/2015 FINDINGS: Heart and mediastinal contours are within normal limits. No focal opacities or effusions. No acute bony abnormality. IMPRESSION: No active disease. Electronically Signed   By: Rolm Baptise M.D.   On: 04/18/2016 14:03     Subjective: No complains  Discharge Exam: Vitals:    04/20/16 2354 04/21/16 0506  BP: (!) 158/78 (!) 165/60  Pulse: 80 77  Resp: 14 16  Temp: 98.7 F (37.1 C) 97.9 F (36.6 C)   Vitals:   04/20/16 1744 04/20/16 1949 04/20/16 2354 04/21/16 0506  BP: (!) 155/69 (!) 170/76 (!) 158/78 (!) 165/60  Pulse: 78 80 80 77  Resp: 18 16 14 16   Temp: 98.4 F (36.9 C) 99.9 F (37.7 C) 98.7 F (37.1 C) 97.9 F (36.6 C)  TempSrc: Oral Oral Oral Oral  SpO2: 98% 97% 98% 97%  Weight:      Height:        General: Pt is alert, awake, not in acute distress Cardiovascular: RRR, S1/S2 +, no rubs, no gallops Respiratory: CTA bilaterally, no wheezing, no rhonchi Abdominal: Soft, NT, ND, bowel sounds + Extremities: no edema, no cyanosis    The results of significant diagnostics from this hospitalization (including imaging, microbiology, ancillary and laboratory) are listed below for reference.     Microbiology: Recent Results (from the past 240 hour(s))  Blood culture (routine x 2)     Status: None (Preliminary result)   Collection Time: 04/18/16  1:57 PM  Result Value Ref Range Status   Specimen Description BLOOD LEFT ANTECUBITAL  Final   Special Requests BOTTLES DRAWN AEROBIC AND ANAEROBIC 5 CC  Final   Culture   Final    NO GROWTH 2 DAYS Performed at [facility]    Report Status PENDING  Incomplete  Blood culture (routine x 2)     Status: None (Preliminary result)   Collection Time: 04/18/16  1:58 PM  Result Value Ref Range Status   Specimen Description BLOOD LEFT HAND  Final   Special Requests BOTTLES DRAWN AEROBIC AND ANAEROBIC 5 CC  Final   Culture   Final    NO GROWTH 2 DAYS Performed at [facility]    Report Status PENDING  Incomplete  MRSA PCR Screening     Status: None   Collection Time: 04/18/16  8:02 PM  Result Value Ref Range Status   MRSA by PCR NEGATIVE NEGATIVE Final    Comment:        The GeneXpert MRSA Assay (FDA approved for NASAL specimens only), is one component of a comprehensive MRSA  colonization surveillance program. It is not intended to diagnose MRSA infection nor to guide or monitor treatment for MRSA infections.      Labs: BNP (last 3 results) No results for input(s): BNP in the last 8760 hours. Basic Metabolic Panel:  Recent Labs Lab 04/18/16 2010  04/19/16 0415 04/19/16 0822 04/19/16 1242 04/20/16 0441 04/21/16 0434  NA 133*  < > 140 137 135 137 136  K 3.6  < > 3.8 3.5 3.4* 4.3 3.5  CL 96*  < > 104 102 102 103 104  CO2 26  < > 27 27 25 29 27   GLUCOSE 437*  < > 154* 225* 203* 81 137*  BUN 69*  < > 53* 45* 37* 23* 15  CREATININE 1.93*  < > 1.48* 1.30* 1.14 1.04 1.02  CALCIUM 7.9*  < > 8.0* 7.9* 7.9* 8.1* 8.0*  MG 2.2  --   --   --   --   --   --  PHOS 1.6*  --   --   --   --   --   --   < > = values in this interval not displayed. Liver Function Tests:  Recent Labs Lab 04/18/16 1324  AST 25  ALT 31  ALKPHOS 102  BILITOT 0.8  PROT 6.8  ALBUMIN 3.3*    Recent Labs Lab 04/18/16 1324  LIPASE 17   No results for input(s): AMMONIA in the last 168 hours. CBC:  Recent Labs Lab 04/18/16 1324 04/18/16 1346 04/18/16 2010 04/19/16 0415 04/20/16 1215 04/21/16 0434  WBC 27.0*  --  24.3* 20.6* 10.3 9.6  HGB 15.8 17.3* 15.1 14.4 14.4 13.6  HCT 49.5 51.0 41.7 39.7 40.9 39.2  MCV 97.4  --  87.2 87.6 89.5 87.7  PLT 335  --  280 262 206 191   Cardiac Enzymes: No results for input(s): CKTOTAL, CKMB, CKMBINDEX, TROPONINI in the last 168 hours. BNP: Invalid input(s): POCBNP CBG:  Recent Labs Lab 04/20/16 1946 04/20/16 2352 04/21/16 0220 04/21/16 0309 04/21/16 0731  GLUCAP 155* 88 46* 116* 70   D-Dimer No results for input(s): DDIMER in the last 72 hours. Hgb A1c  Recent Labs  04/18/16 2010  HGBA1C 8.4*   Lipid Profile No results for input(s): CHOL, HDL, LDLCALC, TRIG, CHOLHDL, LDLDIRECT in the last 72 hours. Thyroid function studies No results for input(s): TSH, T4TOTAL, T3FREE, THYROIDAB in the last 72  hours.  Invalid input(s): FREET3 Anemia work up No results for input(s): VITAMINB12, FOLATE, FERRITIN, TIBC, IRON, RETICCTPCT in the last 72 hours. Urinalysis    Component Value Date/Time   COLORURINE STRAW (A) 04/18/2016 1333   APPEARANCEUR CLEAR 04/18/2016 1333   LABSPEC 1.020 04/18/2016 1333   PHURINE 5.0 04/18/2016 1333   GLUCOSEU >=500 (A) 04/18/2016 1333   GLUCOSEU 500 04/28/2012 1149   HGBUR SMALL (A) 04/18/2016 1333   BILIRUBINUR NEGATIVE 04/18/2016 1333   BILIRUBINUR neg 04/28/2012 1053   KETONESUR 20 (A) 04/18/2016 1333   PROTEINUR 100 (A) 04/18/2016 1333   UROBILINOGEN 4.0 (H) 11/16/2013 1646   NITRITE NEGATIVE 04/18/2016 1333   LEUKOCYTESUR NEGATIVE 04/18/2016 1333   Sepsis Labs Invalid input(s): PROCALCITONIN,  WBC,  LACTICIDVEN Microbiology Recent Results (from the past 240 hour(s))  Blood culture (routine x 2)     Status: None (Preliminary result)   Collection Time: 04/18/16  1:57 PM  Result Value Ref Range Status   Specimen Description BLOOD LEFT ANTECUBITAL  Final   Special Requests BOTTLES DRAWN AEROBIC AND ANAEROBIC 5 CC  Final   Culture   Final    NO GROWTH 2 DAYS Performed at [facility]    Report Status PENDING  Incomplete  Blood culture (routine x 2)     Status: None (Preliminary result)   Collection Time: 04/18/16  1:58 PM  Result Value Ref Range Status   Specimen Description BLOOD LEFT HAND  Final   Special Requests BOTTLES DRAWN AEROBIC AND ANAEROBIC 5 CC  Final   Culture   Final    NO GROWTH 2 DAYS Performed at [facility]    Report Status PENDING  Incomplete  MRSA PCR Screening     Status: None   Collection Time: 04/18/16  8:02 PM  Result Value Ref Range Status   MRSA by PCR NEGATIVE NEGATIVE Final    Comment:        The GeneXpert MRSA Assay (FDA approved for NASAL specimens only), is one component of a comprehensive MRSA colonization surveillance program. It  is not intended to diagnose MRSA infection nor to guide  or monitor treatment for MRSA infections.      Time coordinating discharge: Over 30 minutes  SIGNED:   Charlynne Cousins, MD  [name] 04/21/2016, 11:03 AM Pager   If 7PM-7AM, please contact night-coverage [email or web address] Password [email or web address]

## 2016-04-21 NOTE — Progress Notes (Signed)
Hypoglycemic Event  CBG: 46  Treatment: 15 GM carbohydrate snack  Symptoms: Sweaty, Shaky and Hungry  Follow-up CBG: Time: 0305 CBG Result:115  Possible Reasons for Event: Inadequate meal intake and Medication regimen: NPH insulin  Comments/MD notified:    Regan Rakers

## 2016-04-21 NOTE — Progress Notes (Signed)
Discharge instructions explained to pt. Son came to pick pt up and bring him home. IV dc'd

## 2016-04-23 LAB — CULTURE, BLOOD (ROUTINE X 2)
Culture: NO GROWTH
Culture: NO GROWTH

## 2016-05-20 ENCOUNTER — Ambulatory Visit: Payer: Commercial Managed Care - HMO | Admitting: Endocrinology

## 2016-05-20 ENCOUNTER — Ambulatory Visit (INDEPENDENT_AMBULATORY_CARE_PROVIDER_SITE_OTHER): Payer: Commercial Managed Care - HMO | Admitting: Endocrinology

## 2016-05-20 ENCOUNTER — Encounter: Payer: Self-pay | Admitting: Endocrinology

## 2016-05-20 VITALS — BP 106/60 | HR 91 | Ht 72.0 in | Wt 152.0 lb

## 2016-05-20 DIAGNOSIS — N183 Chronic kidney disease, stage 3 (moderate): Secondary | ICD-10-CM

## 2016-05-20 DIAGNOSIS — E1065 Type 1 diabetes mellitus with hyperglycemia: Secondary | ICD-10-CM | POA: Diagnosis not present

## 2016-05-20 DIAGNOSIS — E1022 Type 1 diabetes mellitus with diabetic chronic kidney disease: Secondary | ICD-10-CM | POA: Diagnosis not present

## 2016-05-20 DIAGNOSIS — L97412 Non-pressure chronic ulcer of right heel and midfoot with fat layer exposed: Secondary | ICD-10-CM | POA: Insufficient documentation

## 2016-05-20 DIAGNOSIS — L97519 Non-pressure chronic ulcer of other part of right foot with unspecified severity: Secondary | ICD-10-CM

## 2016-05-20 DIAGNOSIS — IMO0002 Reserved for concepts with insufficient information to code with codable children: Secondary | ICD-10-CM

## 2016-05-20 LAB — POCT GLYCOSYLATED HEMOGLOBIN (HGB A1C): HEMOGLOBIN A1C: 8.8

## 2016-05-20 LAB — GLUCOSE, POCT (MANUAL RESULT ENTRY): POC GLUCOSE: 206 mg/dL — AB (ref 70–99)

## 2016-05-20 NOTE — Progress Notes (Signed)
Subjective:    Patient ID: [name], male    DOB: Jan 24, 1956, 61 y.o.   MRN: [id number]  HPI Pt returns for f/u of diabetes mellitus: DM type: 1 Dx'ed: 1027 Complications: polyneuropathy, nephropathy and retinopathy.  Therapy: insulin since dx.   DKA: never Severe hypoglycemia: multiple episodes (last was early 2018).   Pancreatitis: never Other: he has done better with a qd insulin regimen; he was changed from lantus to levemir, due to mild hypoglycemia in the early hours of the morning; therapy limited by ongoing use of heroin, EtOH, and cocaine; he says he cannot afford insulin analogs.    Interval history: He has recently hospitalized with DKA, due to running out if his insulin.  He has 3 weeks of moderate ulcer at the right heel, and assoc pain.  He says cbg meter is broken.  He is back on NPH, 45 units qam.   Past Medical History:  Diagnosis Date  . DEPRESSION   . DIABETES MELLITUS, TYPE I   . DRUG ABUSE    pt should have NO controlled substances rx'ed  . Glaucoma   . HEPATITIS C    chronic  . Hypertension 02/18/2011  . 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 Vitiligo     Past Surgical History:  Procedure Laterality Date  . EYE SURGERY     retinal surgery x 2, right eye    Social History   Social History  . Marital status: Single    Spouse name: N/A  . Number of children: N/A  . Years of education: N/A   Occupational History  .  Unemployed    disabled   Social History Main Topics  . Smoking status: Current Every Day Smoker    Packs/day: 2.00    Types: Cigarettes  . Smokeless tobacco: Never Used  . Alcohol use 0.0 oz/week     Comment: occasional  . Drug use: Yes    Types: "Crack" cocaine, Marijuana, Heroin     Comment: s/p rehab x 6, occasional drug use  . Sexual activity: Not Currently   Other Topics Concern  . Not on file   Social History Narrative   Lives in boarding house/homeless   Ongoing occ drug use-crack   Single, disabled,  prev mental health drug counselor.     Current Outpatient Prescriptions on File Prior to Visit  Medication Sig Dispense Refill  . gabapentin (NEURONTIN) 100 MG capsule Take one a night, after two nights increase to 2 caps at night, if tolerated increase to 3 caps at night 90 capsule 3  . insulin NPH Human (NOVOLIN N) 100 UNIT/ML injection Inject 0.25 mLs (25 Units total) into the skin 2 (two) times daily at 8 am and 10 pm. 10 mL 11   No current facility-administered medications on file prior to visit.     Allergies  Allergen Reactions  . Codeine Itching    Family History  Problem Relation Age of Onset  . Diabetes Father   . Kidney disease Father   . Arthritis Mother   . Heart disease Mother     CAD    BP 106/60   Pulse 91   Ht 6' (1.829 m)   Wt 152 lb (68.9 kg)   SpO2 97%   BMI 20.61 kg/m    Review of Systems He has lost weight.      Objective:   Physical Exam VITAL SIGNS:  See vs page GENERAL: no distress Pulses: dorsalis pedis absent bilat.  MSK:  no deformity of the feet.   CV: no leg edema.   Skin: 40x5 mm ulcer on the right heel, at the cracked callus.  normal color and temp on the feet.  Neuro: sensation is intact to touch on the feet, but severely decreased from normal.  A1c=8.8%    Assessment & Plan:  Type 1 DM: ongoing poor glycemic control Noncompliance with cbg recording and insulin: worse. Foot ulcer, new  Patient is advised the following: Patient Instructions  check your blood sugar twice a day.  vary the time of day when you check, between before the 3 meals, and at bedtime.  also check if you have symptoms of your blood sugar being too high or too low.  please keep a record of the readings and bring it to your next appointment here (or you can bring the meter itself).  You can write it on any piece of paper.  please call us sooner if your blood sugar goes below 70, or if you have a lot of readings over 200. Please continue the NPH, 45 units  each morning, and:  On this type of insulin schedule, you should eat meals on a regular schedule.  If a meal is missed or significantly delayed, your blood sugar could go low.   Here is a meter, and some strips.  Here is also some phone numbers, to call for a new meter and strips.  Please see a wound specialist.  you will receive a phone call, about a day and time for an appointment.  Please come back for a follow-up appointment in 3 months.

## 2016-05-20 NOTE — Patient Instructions (Addendum)
check your blood sugar twice a day.  vary the time of day when you check, between before the 3 meals, and at bedtime.  also check if you have symptoms of your blood sugar being too high or too low.  please keep a record of the readings and bring it to your next appointment here (or you can bring the meter itself).  You can write it on any piece of paper.  please call us sooner if your blood sugar goes below 70, or if you have a lot of readings over 200. Please continue the NPH, 45 units each morning, and:  On this type of insulin schedule, you should eat meals on a regular schedule.  If a meal is missed or significantly delayed, your blood sugar could go low.   Here is a meter, and some strips.  Here is also some phone numbers, to call for a new meter and strips.  Please see a wound specialist.  you will receive a phone call, about a day and time for an appointment.  Please come back for a follow-up appointment in 3 months.

## 2016-06-07 ENCOUNTER — Emergency Department (HOSPITAL_COMMUNITY)
Admission: EM | Admit: 2016-06-07 | Discharge: 2016-06-07 | Disposition: A | Discharge status: Home / Self Care | Payer: Medicare HMO | Attending: Emergency Medicine | Admitting: Emergency Medicine

## 2016-06-07 ENCOUNTER — Emergency Department (HOSPITAL_COMMUNITY): Payer: Medicare HMO

## 2016-06-07 ENCOUNTER — Encounter (HOSPITAL_COMMUNITY): Payer: Self-pay

## 2016-06-07 DIAGNOSIS — J449 Chronic obstructive pulmonary disease, unspecified: Secondary | ICD-10-CM | POA: Insufficient documentation

## 2016-06-07 DIAGNOSIS — Y999 Unspecified external cause status: Secondary | ICD-10-CM | POA: Diagnosis not present

## 2016-06-07 DIAGNOSIS — Y929 Unspecified place or not applicable: Secondary | ICD-10-CM | POA: Diagnosis not present

## 2016-06-07 DIAGNOSIS — E1010 Type 1 diabetes mellitus with ketoacidosis without coma: Secondary | ICD-10-CM | POA: Insufficient documentation

## 2016-06-07 DIAGNOSIS — S91301A Unspecified open wound, right foot, initial encounter: Secondary | ICD-10-CM | POA: Diagnosis not present

## 2016-06-07 DIAGNOSIS — L97501 Non-pressure chronic ulcer of other part of unspecified foot limited to breakdown of skin: Secondary | ICD-10-CM | POA: Diagnosis not present

## 2016-06-07 DIAGNOSIS — I10 Essential (primary) hypertension: Secondary | ICD-10-CM | POA: Insufficient documentation

## 2016-06-07 DIAGNOSIS — F17210 Nicotine dependence, cigarettes, uncomplicated: Secondary | ICD-10-CM | POA: Diagnosis not present

## 2016-06-07 DIAGNOSIS — L97519 Non-pressure chronic ulcer of other part of right foot with unspecified severity: Secondary | ICD-10-CM | POA: Diagnosis not present

## 2016-06-07 DIAGNOSIS — R234 Changes in skin texture: Secondary | ICD-10-CM

## 2016-06-07 DIAGNOSIS — X58XXXA Exposure to other specified factors, initial encounter: Secondary | ICD-10-CM | POA: Diagnosis not present

## 2016-06-07 DIAGNOSIS — Y939 Activity, unspecified: Secondary | ICD-10-CM | POA: Insufficient documentation

## 2016-06-07 LAB — CBG MONITORING, ED: GLUCOSE-CAPILLARY: 136 mg/dL — AB (ref 65–99)

## 2016-06-07 MED ORDER — INSULIN NPH (HUMAN) (ISOPHANE) 100 UNIT/ML ~~LOC~~ SUSP: 25.0000 [IU] | Freq: Two times a day (BID) | SUBCUTANEOUS | 0 refills | Status: DC

## 2016-06-07 MED ORDER — NAPROXEN 500 MG PO TABS
500.0000 mg | ORAL_TABLET | Freq: Once | ORAL | Status: AC
  Administered 2016-06-07: 500 mg via ORAL
  Filled 2016-06-07: qty 1

## 2016-06-07 MED ORDER — GABAPENTIN 300 MG PO CAPS: 300.0000 mg | ORAL_CAPSULE | Freq: Every day | ORAL | 0 refills | Status: DC

## 2016-06-07 MED ORDER — INSULIN NPH (HUMAN) (ISOPHANE) 100 UNIT/ML ~~LOC~~ SUSP: 25.0000 [IU] | Freq: Two times a day (BID) | SUBCUTANEOUS | Status: DC

## 2016-06-07 MED ORDER — INSULIN NPH (HUMAN) (ISOPHANE) 100 UNIT/ML ~~LOC~~ SUSP
25.0000 [IU] | Freq: Once | SUBCUTANEOUS | Status: AC
  Administered 2016-06-07: 25 [IU] via SUBCUTANEOUS
  Filled 2016-06-07: qty 10

## 2016-06-07 MED ORDER — GABAPENTIN 300 MG PO CAPS
300.0000 mg | ORAL_CAPSULE | Freq: Once | ORAL | Status: AC
  Administered 2016-06-07: 300 mg via ORAL
  Filled 2016-06-07: qty 1

## 2016-06-07 NOTE — ED Provider Notes (Signed)
[facility] DEPT Provider Note   CSN: [id number] Arrival date & time: 06/07/16  1244     History   Chief Complaint Chief Complaint  Patient presents with  . Wound Infection    HPI [name] is a 61 y.o. male.  Patient is a 61 year old male with a history of diabetes, hepatitis, hypertension presenting today with a chronic slightly worsening right foot wound. Patient was hospitalized approximately a month ago for DKA at that time said had some small wounds to the right foot which have gradually worsened over the last month. He complains of terrible stinging burning foot pain bilaterally but worse on the right. He states the wounds are not improving. He saw Dr. Loanne Drilling who said he would refer to wound care the patient has not received a call for follow-up. He continues to put bacitracin and a M.D. ointment on the wounds without improvement. He denies significant redness or swelling, no fever or infectious symptoms. He has been using his insulin aspart scribe has not used it today because he ran out yesterday and is unable to obtain any more until next week. He denies any, to the foot.   The history is provided by the patient.    Past Medical History:  Diagnosis Date  . DEPRESSION   . DIABETES MELLITUS, TYPE I   . DRUG ABUSE    pt should have NO controlled substances rx'ed  . Glaucoma   . HEPATITIS C    chronic  . Hypertension 02/18/2011  . 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 Vitiligo     Patient Active Problem List   Diagnosis Date Noted  . Foot ulcer ([place]) 05/20/2016  . DKA, type 1 ([place]) 04/18/2016  . AKI (acute kidney injury) ([place]) 04/18/2016  . Hyponatremia 04/18/2016  . Lactic acidosis 04/18/2016  . SIRS (systemic inflammatory response syndrome) ([place]) 04/18/2016  . Severe protein-calorie malnutrition ([place]) 04/18/2016  . Elevated troponin 04/18/2016  . Poor dentition 07/12/2015  . Tobacco use 07/06/2013  . PAD (peripheral artery disease) ([place])  04/29/2013  . COPD, mild ([place])   . 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 Diabetic neuropathy ([place]) 06/21/2010  . PROLIFERATIVE DIABETIC RETINOPATHY 06/21/2010  . SKIN TAG 01/30/2010  . Chronic hepatitis C ([place]) 11/22/2008  . VITILIGO 11/22/2008  . PROTEINURIA, MILD 11/22/2008  . Substance abuse 04/23/2007  . DEPRESSION 11/11/2006    Past Surgical History:  Procedure Laterality Date  . EYE SURGERY     retinal surgery x 2, right eye       Home Medications    Prior to Admission medications   Medication Sig Start Date End Date Taking? Authorizing Provider  gabapentin (NEURONTIN) 100 MG capsule Take one a night, after two nights increase to 2 caps at night, if tolerated increase to 3 caps at night 01/15/16   [name], MD  insulin NPH Human (NOVOLIN N) 100 UNIT/ML injection Inject 0.25 mLs (25 Units total) into the skin 2 (two) times daily at 8 am and 10 pm. 04/21/16   Charlynne Cousins, MD    Family History Family History  Problem Relation Age of Onset  . Diabetes Father   . Kidney disease Father   . Arthritis Mother   . Heart disease Mother     CAD    Social History Social History  Substance Use Topics  . Smoking status: Current Every Day Smoker    Packs/day: 2.00    Types: Cigarettes  . Smokeless  tobacco: Never Used  . Alcohol use 0.0 oz/week     Comment: occasional     Allergies   Codeine   Review of Systems Review of Systems  All other systems reviewed and are negative.    Physical Exam Updated Vital Signs BP 155/86 (BP Location: Right Arm)   Pulse 89   Temp 97.8 F (36.6 C) (Oral)   Resp 18   SpO2 100%   Physical Exam  Constitutional: He is oriented to person, place, and time. He appears well-developed and well-nourished. No distress.  HENT:  Head: Normocephalic and atraumatic.  Mouth/Throat: Oropharynx is clear and moist.  Eyes: Conjunctivae and EOM are normal. Pupils are equal, round, and  reactive to light.  Neck: Normal range of motion. Neck supple.  Cardiovascular: Normal rate.   Pulmonary/Chest: Effort normal.  Musculoskeletal: Normal range of motion. He exhibits tenderness. He exhibits no edema.  Tenderness present with palpation of the entire foot.  He does have large crack over the right heel and crack of the skin over the right great toe with hard dry skin.  No significant erythema or drainage.  Eschar present over these areas.  Cap refill 2 with diminished pulses  Neurological: He is alert and oriented to person, place, and time.  Skin: Skin is warm and dry. No rash noted. No erythema.  Psychiatric: He has a normal mood and affect. His behavior is normal.  Nursing note and vitals reviewed.    ED Treatments / Results  Labs (all labs ordered are listed, but only abnormal results are displayed) Labs Reviewed  CBG MONITORING, ED - Abnormal; Notable for the following:       Result Value   Glucose-Capillary 136 (*)    All other components within normal limits    EKG  EKG Interpretation None       Radiology Dg Foot Complete Right  Result Date: 06/07/2016 CLINICAL DATA:  Severe right foot pain for 1 month. Soft tissue ulcerations of the great toe and at the heel. EXAM: RIGHT FOOT COMPLETE - 3+ VIEW COMPARISON:  Radiographs dated 07/02/2004 FINDINGS: There is no fracture or dislocation acute bone abnormality. There is a deep soft tissue ulcer over the posterior aspect of the calcaneus. There is a fragment of a metallic pin in the soft tissues of the heel slightly laterally. This is not associated with the ulcer. No findings of osteomyelitis. Soft tissue ulcer over distal medial aspect of the great toe. Extensive small-vessel arterial calcifications consistent with the patient's history of diabetes. IMPRESSION: Soft tissue ulcerations.  No evidence of osteomyelitis. Pin fragment in the soft tissues of the heel. Electronically Signed   By: Lorriane Shire M.D.   On:  06/07/2016 13:39    Procedures Procedures (including critical care time)  Medications Ordered in ED Medications  gabapentin (NEURONTIN) tablet 300 mg (not administered)  naproxen (NAPROSYN) tablet 500 mg (not administered)     Initial Impression / Assessment and Plan / ED Course  I have reviewed the triage vital signs and the nursing notes.  Pertinent labs & imaging results that were available during my care of the patient were reviewed by me and considered in my medical decision making (see chart for details).     Patient with pain in bilateral feet but worse in the right over the last month with worsening wounds per the patient.  Patient has no infectious symptoms and no obvious sign of infection on exam but does have hard dry skin is cracked with  a dark eschar in these areas. Patient does have a history of peripheral artery disease and has a diminished pulses but capillary refill is 2 seconds. Patient's pain that he endorses an and speed as a burning painful sensation which could be from peripheral artery disease or from diabetic neuropathy. Will give patient a prescription for gabapentin and given naproxen here. Patient will also need follow-up with wound care. He also will get an x-ray to ensure no evidence of chronic osteomyelitis. He has no infectious signs or symptoms today so do not feel that he needs an antibiotic. Recommended that he stop using Neosporin and use Vaseline to keep the area moist. Patient is also requesting more insulin because he is unable to get a new vial until next week when he gets his disability check.  3:02 PM Pt given insulin here.  Made wound care appt for march and instructed to use vasoline gauze in the meantime.    Final Clinical Impressions(s) / ED Diagnoses   Final diagnoses:  Wound eschar of foot    New Prescriptions New Prescriptions   GABAPENTIN (NEURONTIN) 300 MG CAPSULE    Take 1 capsule (300 mg total) by mouth at bedtime.     Blanchie Dessert, MD 06/07/16 [phone]

## 2016-06-07 NOTE — ED Triage Notes (Signed)
Pt has wound on rt foot.  Diabetic.  Getting infected.  Has been there for a month. Pt also has not had his insulin since yesterday.  Unknown levels.  Recent admission for cbg over 1300.

## 2016-06-07 NOTE — Discharge Instructions (Signed)
Use vasoline to the wound on the foot and dressings.  Do not use neosporin.  Follow up with wound care.

## 2016-06-07 NOTE — Discharge Planning (Signed)
Ada Holness J. Clydene Laming, RN, BSN, [place] [phone] [facility] consulted to set up appointment with wound care center and medication assistance.  EDCM set up appointment with [facility] on 3/12 @ 0800.  Spoke with pt RN via telephone that appointment date and time will be on AVS.   EDCM consulted in regards to medication assistance.  Pt has insurance coverage and is not eligible for [facility] program.  [facility] searched for Rx discount cards and found that [employer] has affordable pricing.  [facility] faxed coupon and directions for use. No further CM needs communicated at this time.

## 2016-06-07 NOTE — ED Notes (Signed)
Dressing applied to posterior right foot per MD-vaseline gauze applied and wrapped with gauze.

## 2016-06-24 ENCOUNTER — Encounter (HOSPITAL_BASED_OUTPATIENT_CLINIC_OR_DEPARTMENT_OTHER): Payer: Medicare HMO | Attending: Internal Medicine

## 2016-06-24 DIAGNOSIS — I10 Essential (primary) hypertension: Secondary | ICD-10-CM | POA: Diagnosis not present

## 2016-06-24 DIAGNOSIS — L97518 Non-pressure chronic ulcer of other part of right foot with other specified severity: Secondary | ICD-10-CM | POA: Insufficient documentation

## 2016-06-24 DIAGNOSIS — F17200 Nicotine dependence, unspecified, uncomplicated: Secondary | ICD-10-CM | POA: Diagnosis not present

## 2016-06-24 DIAGNOSIS — M79604 Pain in right leg: Secondary | ICD-10-CM | POA: Diagnosis not present

## 2016-06-24 DIAGNOSIS — E1052 Type 1 diabetes mellitus with diabetic peripheral angiopathy with gangrene: Secondary | ICD-10-CM | POA: Insufficient documentation

## 2016-06-24 DIAGNOSIS — F17210 Nicotine dependence, cigarettes, uncomplicated: Secondary | ICD-10-CM | POA: Insufficient documentation

## 2016-06-24 DIAGNOSIS — L97512 Non-pressure chronic ulcer of other part of right foot with fat layer exposed: Secondary | ICD-10-CM | POA: Diagnosis not present

## 2016-06-24 DIAGNOSIS — L97411 Non-pressure chronic ulcer of right heel and midfoot limited to breakdown of skin: Secondary | ICD-10-CM | POA: Diagnosis not present

## 2016-06-24 DIAGNOSIS — E1042 Type 1 diabetes mellitus with diabetic polyneuropathy: Secondary | ICD-10-CM | POA: Diagnosis not present

## 2016-06-24 DIAGNOSIS — J449 Chronic obstructive pulmonary disease, unspecified: Secondary | ICD-10-CM | POA: Diagnosis not present

## 2016-06-24 DIAGNOSIS — S91301A Unspecified open wound, right foot, initial encounter: Secondary | ICD-10-CM | POA: Diagnosis not present

## 2016-06-24 DIAGNOSIS — L97511 Non-pressure chronic ulcer of other part of right foot limited to breakdown of skin: Secondary | ICD-10-CM | POA: Insufficient documentation

## 2016-06-24 DIAGNOSIS — E10621 Type 1 diabetes mellitus with foot ulcer: Secondary | ICD-10-CM | POA: Diagnosis not present

## 2016-06-24 LAB — GLUCOSE, CAPILLARY: GLUCOSE-CAPILLARY: 81 mg/dL (ref 65–99)

## 2016-07-01 DIAGNOSIS — I10 Essential (primary) hypertension: Secondary | ICD-10-CM | POA: Diagnosis not present

## 2016-07-01 DIAGNOSIS — L97518 Non-pressure chronic ulcer of other part of right foot with other specified severity: Secondary | ICD-10-CM | POA: Diagnosis not present

## 2016-07-01 DIAGNOSIS — L97411 Non-pressure chronic ulcer of right heel and midfoot limited to breakdown of skin: Secondary | ICD-10-CM | POA: Diagnosis not present

## 2016-07-01 DIAGNOSIS — F17210 Nicotine dependence, cigarettes, uncomplicated: Secondary | ICD-10-CM | POA: Diagnosis not present

## 2016-07-01 DIAGNOSIS — L97511 Non-pressure chronic ulcer of other part of right foot limited to breakdown of skin: Secondary | ICD-10-CM | POA: Diagnosis not present

## 2016-07-01 DIAGNOSIS — S91301A Unspecified open wound, right foot, initial encounter: Secondary | ICD-10-CM | POA: Diagnosis not present

## 2016-07-01 DIAGNOSIS — E1052 Type 1 diabetes mellitus with diabetic peripheral angiopathy with gangrene: Secondary | ICD-10-CM | POA: Diagnosis not present

## 2016-07-01 DIAGNOSIS — E10621 Type 1 diabetes mellitus with foot ulcer: Secondary | ICD-10-CM | POA: Diagnosis not present

## 2016-07-01 DIAGNOSIS — E1042 Type 1 diabetes mellitus with diabetic polyneuropathy: Secondary | ICD-10-CM | POA: Diagnosis not present

## 2016-07-01 DIAGNOSIS — S91101A Unspecified open wound of right great toe without damage to nail, initial encounter: Secondary | ICD-10-CM | POA: Diagnosis not present

## 2016-07-01 DIAGNOSIS — J449 Chronic obstructive pulmonary disease, unspecified: Secondary | ICD-10-CM | POA: Diagnosis not present

## 2016-07-02 ENCOUNTER — Encounter: Payer: Self-pay | Admitting: *Deleted

## 2016-07-02 ENCOUNTER — Ambulatory Visit: Payer: Medicare HMO | Admitting: Cardiovascular Disease

## 2016-07-08 DIAGNOSIS — E1042 Type 1 diabetes mellitus with diabetic polyneuropathy: Secondary | ICD-10-CM | POA: Diagnosis not present

## 2016-07-08 DIAGNOSIS — J449 Chronic obstructive pulmonary disease, unspecified: Secondary | ICD-10-CM | POA: Diagnosis not present

## 2016-07-08 DIAGNOSIS — L97511 Non-pressure chronic ulcer of other part of right foot limited to breakdown of skin: Secondary | ICD-10-CM | POA: Diagnosis not present

## 2016-07-08 DIAGNOSIS — E10621 Type 1 diabetes mellitus with foot ulcer: Secondary | ICD-10-CM | POA: Diagnosis not present

## 2016-07-08 DIAGNOSIS — E1052 Type 1 diabetes mellitus with diabetic peripheral angiopathy with gangrene: Secondary | ICD-10-CM | POA: Diagnosis not present

## 2016-07-08 DIAGNOSIS — L97411 Non-pressure chronic ulcer of right heel and midfoot limited to breakdown of skin: Secondary | ICD-10-CM | POA: Diagnosis not present

## 2016-07-08 DIAGNOSIS — L97518 Non-pressure chronic ulcer of other part of right foot with other specified severity: Secondary | ICD-10-CM | POA: Diagnosis not present

## 2016-07-08 DIAGNOSIS — F17210 Nicotine dependence, cigarettes, uncomplicated: Secondary | ICD-10-CM | POA: Diagnosis not present

## 2016-07-08 DIAGNOSIS — I10 Essential (primary) hypertension: Secondary | ICD-10-CM | POA: Diagnosis not present

## 2016-07-08 LAB — GLUCOSE, CAPILLARY: GLUCOSE-CAPILLARY: 207 mg/dL — AB (ref 65–99)

## 2016-07-15 ENCOUNTER — Encounter (HOSPITAL_BASED_OUTPATIENT_CLINIC_OR_DEPARTMENT_OTHER): Payer: Medicare HMO | Attending: Internal Medicine

## 2016-07-15 DIAGNOSIS — J449 Chronic obstructive pulmonary disease, unspecified: Secondary | ICD-10-CM | POA: Insufficient documentation

## 2016-07-15 DIAGNOSIS — E10621 Type 1 diabetes mellitus with foot ulcer: Secondary | ICD-10-CM | POA: Insufficient documentation

## 2016-07-15 DIAGNOSIS — Z7902 Long term (current) use of antithrombotics/antiplatelets: Secondary | ICD-10-CM | POA: Insufficient documentation

## 2016-07-15 DIAGNOSIS — E1040 Type 1 diabetes mellitus with diabetic neuropathy, unspecified: Secondary | ICD-10-CM | POA: Diagnosis not present

## 2016-07-15 DIAGNOSIS — E1052 Type 1 diabetes mellitus with diabetic peripheral angiopathy with gangrene: Secondary | ICD-10-CM | POA: Insufficient documentation

## 2016-07-15 DIAGNOSIS — Y835 Amputation of limb(s) as the cause of abnormal reaction of the patient, or of later complication, without mention of misadventure at the time of the procedure: Secondary | ICD-10-CM | POA: Diagnosis not present

## 2016-07-15 DIAGNOSIS — Z89421 Acquired absence of other right toe(s): Secondary | ICD-10-CM | POA: Diagnosis not present

## 2016-07-15 DIAGNOSIS — I10 Essential (primary) hypertension: Secondary | ICD-10-CM | POA: Diagnosis not present

## 2016-07-15 DIAGNOSIS — T8189XA Other complications of procedures, not elsewhere classified, initial encounter: Secondary | ICD-10-CM | POA: Insufficient documentation

## 2016-07-15 DIAGNOSIS — E1042 Type 1 diabetes mellitus with diabetic polyneuropathy: Secondary | ICD-10-CM | POA: Insufficient documentation

## 2016-07-15 DIAGNOSIS — Z7982 Long term (current) use of aspirin: Secondary | ICD-10-CM | POA: Diagnosis not present

## 2016-07-15 DIAGNOSIS — L97411 Non-pressure chronic ulcer of right heel and midfoot limited to breakdown of skin: Secondary | ICD-10-CM | POA: Diagnosis not present

## 2016-07-15 DIAGNOSIS — F17210 Nicotine dependence, cigarettes, uncomplicated: Secondary | ICD-10-CM | POA: Insufficient documentation

## 2016-07-15 DIAGNOSIS — Z95820 Peripheral vascular angioplasty status with implants and grafts: Secondary | ICD-10-CM | POA: Diagnosis not present

## 2016-07-15 DIAGNOSIS — L97512 Non-pressure chronic ulcer of other part of right foot with fat layer exposed: Secondary | ICD-10-CM | POA: Diagnosis not present

## 2016-07-15 DIAGNOSIS — L97511 Non-pressure chronic ulcer of other part of right foot limited to breakdown of skin: Secondary | ICD-10-CM | POA: Diagnosis not present

## 2016-07-22 ENCOUNTER — Inpatient Hospital Stay (HOSPITAL_COMMUNITY)
Admission: AD | Admit: 2016-07-22 | Discharge: 2016-07-30 | DRG: 253 | Disposition: A | Discharge status: Home Health | Payer: Medicare HMO | Source: Ambulatory Visit | Attending: Internal Medicine | Admitting: Internal Medicine

## 2016-07-22 ENCOUNTER — Ambulatory Visit (INDEPENDENT_AMBULATORY_CARE_PROVIDER_SITE_OTHER): Payer: Medicare HMO | Admitting: Cardiovascular Disease

## 2016-07-22 ENCOUNTER — Encounter (HOSPITAL_COMMUNITY): Payer: Self-pay | Admitting: Cardiology

## 2016-07-22 ENCOUNTER — Encounter: Payer: Self-pay | Admitting: Cardiovascular Disease

## 2016-07-22 VITALS — BP 130/60 | HR 96 | Ht 72.0 in | Wt 164.0 lb

## 2016-07-22 DIAGNOSIS — E118 Type 2 diabetes mellitus with unspecified complications: Secondary | ICD-10-CM | POA: Diagnosis not present

## 2016-07-22 DIAGNOSIS — I70235 Atherosclerosis of native arteries of right leg with ulceration of other part of foot: Secondary | ICD-10-CM | POA: Diagnosis not present

## 2016-07-22 DIAGNOSIS — E1040 Type 1 diabetes mellitus with diabetic neuropathy, unspecified: Secondary | ICD-10-CM | POA: Diagnosis present

## 2016-07-22 DIAGNOSIS — E1169 Type 2 diabetes mellitus with other specified complication: Secondary | ICD-10-CM | POA: Diagnosis not present

## 2016-07-22 DIAGNOSIS — H409 Unspecified glaucoma: Secondary | ICD-10-CM | POA: Diagnosis present

## 2016-07-22 DIAGNOSIS — Z8261 Family history of arthritis: Secondary | ICD-10-CM | POA: Diagnosis not present

## 2016-07-22 DIAGNOSIS — L0390 Cellulitis, unspecified: Secondary | ICD-10-CM | POA: Diagnosis not present

## 2016-07-22 DIAGNOSIS — I739 Peripheral vascular disease, unspecified: Secondary | ICD-10-CM

## 2016-07-22 DIAGNOSIS — E10628 Type 1 diabetes mellitus with other skin complications: Secondary | ICD-10-CM | POA: Diagnosis present

## 2016-07-22 DIAGNOSIS — M795 Residual foreign body in soft tissue: Secondary | ICD-10-CM | POA: Diagnosis not present

## 2016-07-22 DIAGNOSIS — L089 Local infection of the skin and subcutaneous tissue, unspecified: Secondary | ICD-10-CM | POA: Diagnosis not present

## 2016-07-22 DIAGNOSIS — E10621 Type 1 diabetes mellitus with foot ulcer: Secondary | ICD-10-CM | POA: Diagnosis not present

## 2016-07-22 DIAGNOSIS — Z885 Allergy status to narcotic agent status: Secondary | ICD-10-CM | POA: Diagnosis not present

## 2016-07-22 DIAGNOSIS — L97519 Non-pressure chronic ulcer of other part of right foot with unspecified severity: Secondary | ICD-10-CM

## 2016-07-22 DIAGNOSIS — B1920 Unspecified viral hepatitis C without hepatic coma: Secondary | ICD-10-CM | POA: Diagnosis present

## 2016-07-22 DIAGNOSIS — J449 Chronic obstructive pulmonary disease, unspecified: Secondary | ICD-10-CM

## 2016-07-22 DIAGNOSIS — L97418 Non-pressure chronic ulcer of right heel and midfoot with other specified severity: Secondary | ICD-10-CM | POA: Diagnosis not present

## 2016-07-22 DIAGNOSIS — E1069 Type 1 diabetes mellitus with other specified complication: Secondary | ICD-10-CM | POA: Diagnosis present

## 2016-07-22 DIAGNOSIS — E103599 Type 1 diabetes mellitus with proliferative diabetic retinopathy without macular edema, unspecified eye: Secondary | ICD-10-CM | POA: Diagnosis present

## 2016-07-22 DIAGNOSIS — I10 Essential (primary) hypertension: Secondary | ICD-10-CM | POA: Diagnosis not present

## 2016-07-22 DIAGNOSIS — F17210 Nicotine dependence, cigarettes, uncomplicated: Secondary | ICD-10-CM | POA: Diagnosis not present

## 2016-07-22 DIAGNOSIS — I70234 Atherosclerosis of native arteries of right leg with ulceration of heel and midfoot: Secondary | ICD-10-CM | POA: Diagnosis not present

## 2016-07-22 DIAGNOSIS — E1065 Type 1 diabetes mellitus with hyperglycemia: Secondary | ICD-10-CM | POA: Diagnosis present

## 2016-07-22 DIAGNOSIS — S90851A Superficial foreign body, right foot, initial encounter: Secondary | ICD-10-CM | POA: Diagnosis not present

## 2016-07-22 DIAGNOSIS — M869 Osteomyelitis, unspecified: Secondary | ICD-10-CM | POA: Diagnosis present

## 2016-07-22 DIAGNOSIS — F1290 Cannabis use, unspecified, uncomplicated: Secondary | ICD-10-CM | POA: Diagnosis present

## 2016-07-22 DIAGNOSIS — F1910 Other psychoactive substance abuse, uncomplicated: Secondary | ICD-10-CM | POA: Diagnosis not present

## 2016-07-22 DIAGNOSIS — I340 Nonrheumatic mitral (valve) insufficiency: Secondary | ICD-10-CM | POA: Diagnosis present

## 2016-07-22 DIAGNOSIS — Z89411 Acquired absence of right great toe: Secondary | ICD-10-CM | POA: Diagnosis not present

## 2016-07-22 DIAGNOSIS — Z8249 Family history of ischemic heart disease and other diseases of the circulatory system: Secondary | ICD-10-CM | POA: Diagnosis not present

## 2016-07-22 DIAGNOSIS — Z794 Long term (current) use of insulin: Secondary | ICD-10-CM | POA: Diagnosis not present

## 2016-07-22 DIAGNOSIS — F1410 Cocaine abuse, uncomplicated: Secondary | ICD-10-CM | POA: Diagnosis present

## 2016-07-22 DIAGNOSIS — I70261 Atherosclerosis of native arteries of extremities with gangrene, right leg: Secondary | ICD-10-CM | POA: Diagnosis not present

## 2016-07-22 DIAGNOSIS — Z66 Do not resuscitate: Secondary | ICD-10-CM | POA: Diagnosis present

## 2016-07-22 DIAGNOSIS — I96 Gangrene, not elsewhere classified: Secondary | ICD-10-CM | POA: Diagnosis not present

## 2016-07-22 DIAGNOSIS — L97501 Non-pressure chronic ulcer of other part of unspecified foot limited to breakdown of skin: Secondary | ICD-10-CM | POA: Diagnosis not present

## 2016-07-22 DIAGNOSIS — F329 Major depressive disorder, single episode, unspecified: Secondary | ICD-10-CM | POA: Diagnosis present

## 2016-07-22 DIAGNOSIS — E11628 Type 2 diabetes mellitus with other skin complications: Secondary | ICD-10-CM | POA: Diagnosis not present

## 2016-07-22 DIAGNOSIS — Z8739 Personal history of other diseases of the musculoskeletal system and connective tissue: Secondary | ICD-10-CM | POA: Diagnosis not present

## 2016-07-22 DIAGNOSIS — L97412 Non-pressure chronic ulcer of right heel and midfoot with fat layer exposed: Secondary | ICD-10-CM | POA: Diagnosis not present

## 2016-07-22 DIAGNOSIS — E1052 Type 1 diabetes mellitus with diabetic peripheral angiopathy with gangrene: Secondary | ICD-10-CM | POA: Diagnosis not present

## 2016-07-22 DIAGNOSIS — F1990 Other psychoactive substance use, unspecified, uncomplicated: Secondary | ICD-10-CM | POA: Diagnosis not present

## 2016-07-22 DIAGNOSIS — IMO0002 Reserved for concepts with insufficient information to code with codable children: Secondary | ICD-10-CM | POA: Diagnosis present

## 2016-07-22 DIAGNOSIS — L97514 Non-pressure chronic ulcer of other part of right foot with necrosis of bone: Secondary | ICD-10-CM | POA: Diagnosis not present

## 2016-07-22 DIAGNOSIS — N181 Chronic kidney disease, stage 1: Secondary | ICD-10-CM | POA: Diagnosis not present

## 2016-07-22 DIAGNOSIS — M79671 Pain in right foot: Secondary | ICD-10-CM | POA: Diagnosis present

## 2016-07-22 DIAGNOSIS — E1029 Type 1 diabetes mellitus with other diabetic kidney complication: Secondary | ICD-10-CM | POA: Diagnosis present

## 2016-07-22 DIAGNOSIS — Z720 Tobacco use: Secondary | ICD-10-CM | POA: Diagnosis not present

## 2016-07-22 DIAGNOSIS — E1151 Type 2 diabetes mellitus with diabetic peripheral angiopathy without gangrene: Secondary | ICD-10-CM | POA: Diagnosis not present

## 2016-07-22 DIAGNOSIS — E1022 Type 1 diabetes mellitus with diabetic chronic kidney disease: Secondary | ICD-10-CM | POA: Diagnosis not present

## 2016-07-22 DIAGNOSIS — E11621 Type 2 diabetes mellitus with foot ulcer: Secondary | ICD-10-CM | POA: Diagnosis not present

## 2016-07-22 DIAGNOSIS — Z833 Family history of diabetes mellitus: Secondary | ICD-10-CM | POA: Diagnosis not present

## 2016-07-22 DIAGNOSIS — L97529 Non-pressure chronic ulcer of other part of left foot with unspecified severity: Secondary | ICD-10-CM | POA: Diagnosis not present

## 2016-07-22 DIAGNOSIS — E109 Type 1 diabetes mellitus without complications: Secondary | ICD-10-CM

## 2016-07-22 DIAGNOSIS — Z841 Family history of disorders of kidney and ureter: Secondary | ICD-10-CM | POA: Diagnosis not present

## 2016-07-22 LAB — CBC WITH DIFFERENTIAL/PLATELET
BASOS ABS: 0 10*3/uL (ref 0.0–0.1)
Basophils Relative: 0 %
EOS PCT: 1 %
Eosinophils Absolute: 0.2 10*3/uL (ref 0.0–0.7)
HCT: 34.4 % — ABNORMAL LOW (ref 39.0–52.0)
Hemoglobin: 12 g/dL — ABNORMAL LOW (ref 13.0–17.0)
LYMPHS ABS: 2.5 10*3/uL (ref 0.7–4.0)
Lymphocytes Relative: 14 %
MCH: 30.8 pg (ref 26.0–34.0)
MCHC: 34.9 g/dL (ref 30.0–36.0)
MCV: 88.4 fL (ref 78.0–100.0)
MONO ABS: 0.9 10*3/uL (ref 0.1–1.0)
Monocytes Relative: 5 %
Neutro Abs: 13.9 10*3/uL — ABNORMAL HIGH (ref 1.7–7.7)
Neutrophils Relative %: 80 %
PLATELETS: 339 10*3/uL (ref 150–400)
RBC: 3.89 MIL/uL — AB (ref 4.22–5.81)
RDW: 12.8 % (ref 11.5–15.5)
WBC: 17.5 10*3/uL — AB (ref 4.0–10.5)

## 2016-07-22 LAB — COMPREHENSIVE METABOLIC PANEL
ALT: 23 U/L (ref 17–63)
AST: 30 U/L (ref 15–41)
Albumin: 2.5 g/dL — ABNORMAL LOW (ref 3.5–5.0)
Alkaline Phosphatase: 77 U/L (ref 38–126)
Anion gap: 8 (ref 5–15)
BUN: 13 mg/dL (ref 6–20)
CO2: 27 mmol/L (ref 22–32)
Calcium: 9.1 mg/dL (ref 8.9–10.3)
Chloride: 102 mmol/L (ref 101–111)
Creatinine, Ser: 1.05 mg/dL (ref 0.61–1.24)
GFR calc Af Amer: >60 mL/min (ref >60)
Glucose, Bld: 115 mg/dL — ABNORMAL HIGH (ref 65–99)
Potassium: 4 mmol/L (ref 3.5–5.1)
Sodium: 137 mmol/L (ref 135–145)
TOTAL PROTEIN: 6.6 g/dL (ref 6.5–8.1)
Total Bilirubin: 0.2 mg/dL — ABNORMAL LOW (ref 0.3–1.2)

## 2016-07-22 LAB — GLUCOSE, CAPILLARY
GLUCOSE-CAPILLARY: 118 mg/dL — AB (ref 65–99)
Glucose-Capillary: 276 mg/dL — ABNORMAL HIGH (ref 65–99)

## 2016-07-22 LAB — MRSA PCR SCREENING: MRSA by PCR: NEGATIVE

## 2016-07-22 MED ORDER — VANCOMYCIN HCL IN DEXTROSE 1-5 GM/200ML-% IV SOLN
1000.0000 mg | Freq: Two times a day (BID) | INTRAVENOUS | Status: DC
  Administered 2016-07-23 – 2016-07-25 (×5): 1000 mg via INTRAVENOUS
  Filled 2016-07-22 (×6): qty 200

## 2016-07-22 MED ORDER — LORAZEPAM 2 MG/ML IJ SOLN
1.0000 mg | Freq: Four times a day (QID) | INTRAMUSCULAR | Status: AC | PRN
  Administered 2016-07-23: 1 mg via INTRAVENOUS
  Filled 2016-07-22: qty 1

## 2016-07-22 MED ORDER — TRAMADOL HCL 50 MG PO TABS
50.0000 mg | ORAL_TABLET | Freq: Four times a day (QID) | ORAL | Status: DC | PRN
  Administered 2016-07-22 – 2016-07-23 (×3): 50 mg via ORAL
  Filled 2016-07-22 (×3): qty 1

## 2016-07-22 MED ORDER — DEXTROSE 5 % IV SOLN
2.0000 g | Freq: Two times a day (BID) | INTRAVENOUS | Status: DC
  Administered 2016-07-22 – 2016-07-25 (×6): 2 g via INTRAVENOUS
  Filled 2016-07-22 (×7): qty 2

## 2016-07-22 MED ORDER — ALPRAZOLAM 0.25 MG PO TABS: 0.2500 mg | ORAL_TABLET | Freq: Two times a day (BID) | ORAL | Status: DC | PRN

## 2016-07-22 MED ORDER — ZOLPIDEM TARTRATE 5 MG PO TABS
5.0000 mg | ORAL_TABLET | Freq: Every evening | ORAL | Status: DC | PRN
  Administered 2016-07-23 – 2016-07-28 (×6): 5 mg via ORAL
  Filled 2016-07-22 (×6): qty 1

## 2016-07-22 MED ORDER — FOLIC ACID 1 MG PO TABS
1.0000 mg | ORAL_TABLET | Freq: Every day | ORAL | Status: DC
  Administered 2016-07-23 – 2016-07-30 (×7): 1 mg via ORAL
  Filled 2016-07-22 (×7): qty 1

## 2016-07-22 MED ORDER — VITAMIN B-1 100 MG PO TABS
100.0000 mg | ORAL_TABLET | Freq: Every day | ORAL | Status: DC
  Administered 2016-07-23 – 2016-07-30 (×7): 100 mg via ORAL
  Filled 2016-07-22 (×7): qty 1

## 2016-07-22 MED ORDER — ENOXAPARIN SODIUM 40 MG/0.4ML ~~LOC~~ SOLN
40.0000 mg | SUBCUTANEOUS | Status: DC
  Administered 2016-07-22 – 2016-07-29 (×7): 40 mg via SUBCUTANEOUS
  Filled 2016-07-22 (×7): qty 0.4

## 2016-07-22 MED ORDER — INSULIN ASPART 100 UNIT/ML ~~LOC~~ SOLN
0.0000 [IU] | Freq: Three times a day (TID) | SUBCUTANEOUS | Status: DC
  Administered 2016-07-23: 4 [IU] via SUBCUTANEOUS
  Administered 2016-07-24 (×2): 7 [IU] via SUBCUTANEOUS
  Administered 2016-07-24: 3 [IU] via SUBCUTANEOUS
  Administered 2016-07-25: 4 [IU] via SUBCUTANEOUS
  Administered 2016-07-25: 15 [IU] via SUBCUTANEOUS

## 2016-07-22 MED ORDER — DIPHENHYDRAMINE HCL 25 MG PO CAPS
25.0000 mg | ORAL_CAPSULE | Freq: Four times a day (QID) | ORAL | Status: DC | PRN
  Administered 2016-07-22: 25 mg via ORAL
  Filled 2016-07-22: qty 1

## 2016-07-22 MED ORDER — LORAZEPAM 1 MG PO TABS
1.0000 mg | ORAL_TABLET | Freq: Four times a day (QID) | ORAL | Status: AC | PRN
  Administered 2016-07-22 – 2016-07-24 (×2): 1 mg via ORAL
  Filled 2016-07-22 (×2): qty 1

## 2016-07-22 MED ORDER — THIAMINE HCL 100 MG/ML IJ SOLN: 100.0000 mg | Freq: Every day | INTRAMUSCULAR | Status: DC

## 2016-07-22 MED ORDER — VANCOMYCIN HCL 10 G IV SOLR
1500.0000 mg | Freq: Once | INTRAVENOUS | Status: AC
  Administered 2016-07-22: 1500 mg via INTRAVENOUS
  Filled 2016-07-22: qty 1500

## 2016-07-22 MED ORDER — INSULIN NPH (HUMAN) (ISOPHANE) 100 UNIT/ML ~~LOC~~ SUSP
15.0000 [IU] | Freq: Every day | SUBCUTANEOUS | Status: DC
  Filled 2016-07-22: qty 10

## 2016-07-22 MED ORDER — ONDANSETRON HCL 4 MG/2ML IJ SOLN: 4.0000 mg | Freq: Four times a day (QID) | INTRAMUSCULAR | Status: DC | PRN

## 2016-07-22 MED ORDER — ADULT MULTIVITAMIN W/MINERALS CH
1.0000 | ORAL_TABLET | Freq: Every day | ORAL | Status: DC
  Administered 2016-07-23 – 2016-07-30 (×7): 1 via ORAL
  Filled 2016-07-22 (×7): qty 1

## 2016-07-22 MED ORDER — ACETAMINOPHEN 325 MG PO TABS
650.0000 mg | ORAL_TABLET | ORAL | Status: DC | PRN
  Filled 2016-07-22: qty 2

## 2016-07-22 MED ORDER — INSULIN NPH (HUMAN) (ISOPHANE) 100 UNIT/ML ~~LOC~~ SUSP
10.0000 [IU] | Freq: Every day | SUBCUTANEOUS | Status: DC
  Filled 2016-07-22 (×2): qty 10

## 2016-07-22 MED ORDER — INSULIN ASPART 100 UNIT/ML ~~LOC~~ SOLN
0.0000 [IU] | Freq: Every day | SUBCUTANEOUS | Status: DC
  Administered 2016-07-22: 3 [IU] via SUBCUTANEOUS
  Administered 2016-07-23: 2 [IU] via SUBCUTANEOUS

## 2016-07-22 MED ORDER — NITROGLYCERIN 0.4 MG SL SUBL: 0.4000 mg | SUBLINGUAL_TABLET | SUBLINGUAL | Status: DC | PRN

## 2016-07-22 NOTE — Progress Notes (Signed)
Redness to left foot with necrosis of the great toe. One open area to the left heal and left lateral foot present on admission. WOC consult initiated.

## 2016-07-22 NOTE — Progress Notes (Signed)
The patient was seen as a new patient in the office for evaluation of PVD with nonhealing right great toe wound with possible gangrene.  It was felt that the patient had significant cellulitis with possible osteomyelitis associated with a gangrenous R great toe.  As result, he was admitted to the hospital.  Please see the hospital H&P for details of my evaluation.  Troy Sine, MD, [facility] 07/22/2016

## 2016-07-22 NOTE — Patient Instructions (Signed)
You are being admitted to [facility] to treat the infection in your right foot.  Please follow the signs to the admissions department.   Please have your family member to get a wheelchair for assistance. We recommend that you walk as little as possible on your foot.

## 2016-07-22 NOTE — Progress Notes (Signed)
Pharmacy Antibiotic Note [name] is a 61 y.o. male admitted on 07/22/2016 with R foot cellulitis with gangrene. Pharmacy has been consulted for vancomycin dosing.  Plan: 1. Vancomycin 1000 IV every 12 hours.  Goal trough 15-20 mcg/mL.  2. Continue Cefepime 2 grams IV every 12 hours 3. SCr every 72 hours while on vancomycin   Temp (24hrs), Avg:99.1 F (37.3 C), Min:99.1 F (37.3 C), Max:99.1 F (37.3 C)   Recent Labs Lab 07/22/16 1809  WBC 17.5*  CREATININE 1.05    Estimated Creatinine Clearance: 79.7 mL/min (by C-G formula based on SCr of 1.05 mg/dL).    Allergies  Allergen Reactions  . Codeine Itching    Antimicrobials this admission: 4/9 Cefepime >>  4/9 vancomycin >>   Microbiology results: 4/9 BCx: px 4/9 MRSA PCR: negative   Thank you for allowing pharmacy to be a part of this patient's care.  Vincenza Hews, PharmD, BCPS 07/22/2016, 7:55 PM

## 2016-07-22 NOTE — H&P (Addendum)
History and Physical   Patient ID: [name] MRN: [id number], DOB/AGE: January 07, 1956 61 y.o. Date of Encounter: 07/22/2016  Primary Physician: Renato Shin, MD Primary Cardiologist: Dr Claiborne Billings (new today)  Chief Complaint: LE cellulitis vs gangrene  HPI: [name] is a 61 y.o. male with a history of DM1 (uncontrolled), hep C, HTN, depression, drug abuse, tob use. He smoked crack last 3 days ago.  He has been having problems with his R great toe for over a month. It started after he was hospitalized with DKA and SIRS in January 2018. He had a R heel ulcer in January and was on antibiotics. He saw Dr Loanne Drilling 02/05 and was referred to a a wound specialist for the heel wound. He then developed the right great toe wound. The right heel wound is blackened but not draining and not eating larger. The right great toe wound has been worsening steadily. It has grown more swollen, or reddened, started blackening and his foot has become swollen.  In the last 3 or 4 days, he has developed chills and subjective fevers. He has general malaise and generally feels very bad. He is unable to walk on the foot. He has a supportive shoe and a walker that he uses to get around.  He is not having any cardiac symptoms. Specifically, he is not having any chest pain, no new dyspnea on exertion and shortness of breath. He denies orthopnea or PND. He is not having palpitations. He is having weakness and some dizziness. He has not been checking his blood sugars.  He has been smoking marijuana which helps with the pain.   Past Medical History:  Diagnosis Date  . DEPRESSION   . DIABETES MELLITUS, TYPE I   . DRUG ABUSE    pt should have NO controlled substances rx'ed  . Glaucoma   . HEPATITIS C    chronic  . Hypertension 02/18/2011  . 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 Vitiligo     Surgical History:  Past Surgical History:  Procedure Laterality Date  . EYE SURGERY     retinal surgery x 2, right  eye     I have reviewed the patient's current medications. Prior to Admission medications   Medication Sig Start Date End Date Taking? Authorizing Provider  insulin NPH Human (NOVOLIN N) 100 UNIT/ML injection Inject 0.25 mLs (25 Units total) into the skin 2 (two) times daily at 8 am and 10 pm. 06/07/16   Blanchie Dessert, MD   Scheduled Meds: Continuous Infusions: PRN Meds:.  Allergies:  Allergies  Allergen Reactions  . Codeine Itching    Social History   Social History  . Marital status: Single    Spouse name: N/A  . Number of children: N/A  . Years of education: N/A   Occupational History  .  Unemployed    disabled   Social History Main Topics  . Smoking status: Current Every Day Smoker    Packs/day: 2.00    Types: Cigarettes  . Smokeless tobacco: Never Used  . Alcohol use 0.0 oz/week     Comment: occasional  . Drug use: Yes    Types: "Crack" cocaine, Marijuana, Heroin     Comment: s/p rehab x 6, occasional drug use  . Sexual activity: Not Currently   Other Topics Concern  . Not on file   Social History Narrative   Lives in boarding house/homeless   Ongoing occ drug use-crack   Single, disabled, prev mental health  drug counselor.     Family History  Problem Relation Age of Onset  . Diabetes Father   . Kidney disease Father   . Arthritis Mother   . Heart disease Mother     CAD   Family Status  Relation Status  . Father Deceased at age 55   ESRD, DM  . Mother Alive  . Brother Alive  . Maternal Grandmother Deceased  . Maternal Grandfather Deceased  . Paternal Grandmother Deceased  . Paternal Grandfather Deceased  . Son Alive  . Son Alive    Review of Systems:   Full 14-point review of systems otherwise negative except as noted above.  Physical Exam: BP Readings from Last 1 Encounters:  07/22/16 130/60, Heart rate 72    General: Well developed, Slender but well nourished,male in no acute distress. Head: Normocephalic, atraumatic, sclera  non-icteric, no xanthomas, nares are without discharge. Dentition: Poor Neck: No carotid bruits. JVD not elevated. No thyromegally Lungs: Good expansion bilaterally. without wheezes or rhonchi.  Heart: Regular rate and rhythm with S1 S2.  No S3 or S4.  2/6 systolic murmur, loudest at the left lower sternal border, no rubs, or gallops appreciated. Abdomen: Soft, non-tender, non-distended with normoactive bowel sounds. No hepatomegaly. No rebound/guarding. No obvious abdominal masses. Msk:  Strength and tone appear normal for age. No joint deformities or effusions, no spine or costo-vertebral angle tenderness. Extremities: No clubbing or cyanosis. Right pedal edema.  Distal pedal pulses are 2+ in both upper extrem; left DP is weak but palpable, capillary refill 5 seconds. Right DP and PT are not palpable, capillary refill is within normal limits Neuro: Alert and oriented X 3. Moves all extremities spontaneously. No focal deficits noted. Skin: No rashes or lesions noted  Labs:   Pending  Radiology/Studies: Pending  Echo: 04/19/2016 - Left ventricle: The cavity size was normal. Systolic function was   normal. The estimated ejection fraction was in the range of 60%   to 65%. Rathgeber motion was normal; there were no regional Adan   motion abnormalities. Left ventricular diastolic function   parameters were normal. - Mitral valve: Calcified annulus. - Atrial septum: No defect or patent foramen ovale was identified. - Pulmonic valve: Peak gradient (S): 11 mm Hg. - Impressions: Low normal GLS -15.3. Impressions: - Low normal GLS -15.3.  ECG: 07/22/2016 SR, No acute changes  ASSESSMENT AND PLAN:  Principal Problem:   Diabetic infection of right foot ([place]) - Spoke with the pharmacist here. Start Maxipime at 2 g every 12 hours and vancomycin 1.5 g every 12 hours. - Dose adjustments per pharmacy.  Active Problems:   Substance abuse - We'll start the CIWA protocol    Uncontrolled type 1  diabetes mellitus with renal manifestations ([name] - Sliding-scale insulin plus his home dose - Because he does not stick to a diabetic diet, I will decrease his home dose by half until we see how well his blood sugars are controlled    PAD (peripheral artery disease) ([place]) - We'll check ABIs. - Those will likely be abnormal, he may need a VVS consult    Foot ulcer ([place]) - We'll have wound care C4 both the foot ulcer on his heel and the toe ulcer   The patient was seen in the office and evaluated by Dr. Claiborne Billings. It is appropriate for him to be admitted which we are doing. However, he does not have any active cardiac issues.   Tomorrow, the patient can be seen by internal medicine  and hopefully be transferred to their service. We will continue to follow.  Augusto Garbe 07/22/2016 4:46 PM Beeper [phone]   Patient seen and examined. Agree with assessment and plan.  Mr. Quant is a 61 year old gentleman who presented to the office today as a new evaluation.  He has a history of type 1 diabetes mellitus since age 68 and at age 46 also started to smoke.  He continues to smoke daily.  He has a history of polysubstance abuse and a history of hepatitis C.  He was admitted in January 2018 with severe diabetic ketoacidosis.  He initially developed a his right heel and was referred to the wound center.  The past month, he has developed as sharp and a blackened right great toe.  Notice some discharge and a smell in this region.  He also has developed over the past week progressive erythema and swelling of dorsum of his foot.  He has noticed chills and progressive erythema.  He has not taken his temperature.  He was referred from the wound center to the cardiology office today for evaluation of his diabetic wound, and peripheral vascular disease.  On exam today he is in obvious discomfort.  Signs are stable.  I did not see any hemorrhages or exudates but he has a history of laser surgery to his  eyes for diabetic retinopathy.  He has decreased breath sounds.  There is no JVD.  Rhythm was regular with a 2/6 murmur of mitral regurgitation at the apex.  Abdomen is soft and nontender.  His skin is notable for vitiligo on his torso.  Femoral pulses are 2+.  Decreased pulses in the right posterior tibial region.  There is significant erythema of the dorsum of his right foot suggesting cellulitis with black F Shaw, suggestive of gangrene involving the right great toe with some discoloration under his nail.  There is a blackened-are without purulence, focally involving the heel of his right foot. I am concerned that he has developed gangrene of his right great toe with possible osteomyelitis as well as significant cellulitis extending over the dorsum of his foot.  As result, he was admitted to the hospital for IV antibiotic therapy.  He will most likely require a bone scan as well as vascular surgical evaluation and may ultimately require amputation.  Plan to have internal medicine see the patient for additional diabetic management.  ECG has not demonstrated any ischemic changes.  With his long-standing diabetes mellitus and tobacco history, would also recommend 2-D echo Doppler study to evaluate both systolic and diastolic function.  Examination suggests a murmur of mitral regurgitation at the apex.  Admission laboratory will be sent.  The patient may require blood cultures if febrile or WBC elevated.  He will also need lower extremity arterial Doppler studies  and possible venous studies to make certain there is no evidence for concomitant DVT.    Troy Sine, MD, [facility] 07/22/2016 7:05 PM

## 2016-07-22 NOTE — Progress Notes (Signed)
Patient states he wishes to be a DNR. Patient also requesting pain medication. Will give tylenol. MD and NP notified regarding both issues.

## 2016-07-23 ENCOUNTER — Inpatient Hospital Stay (HOSPITAL_COMMUNITY): Payer: Medicare HMO

## 2016-07-23 DIAGNOSIS — I10 Essential (primary) hypertension: Secondary | ICD-10-CM

## 2016-07-23 DIAGNOSIS — E118 Type 2 diabetes mellitus with unspecified complications: Secondary | ICD-10-CM

## 2016-07-23 DIAGNOSIS — L97529 Non-pressure chronic ulcer of other part of left foot with unspecified severity: Secondary | ICD-10-CM

## 2016-07-23 DIAGNOSIS — E11621 Type 2 diabetes mellitus with foot ulcer: Secondary | ICD-10-CM

## 2016-07-23 DIAGNOSIS — Z720 Tobacco use: Secondary | ICD-10-CM

## 2016-07-23 DIAGNOSIS — E1029 Type 1 diabetes mellitus with other diabetic kidney complication: Secondary | ICD-10-CM

## 2016-07-23 DIAGNOSIS — E1065 Type 1 diabetes mellitus with hyperglycemia: Secondary | ICD-10-CM

## 2016-07-23 DIAGNOSIS — E109 Type 1 diabetes mellitus without complications: Secondary | ICD-10-CM

## 2016-07-23 DIAGNOSIS — I96 Gangrene, not elsewhere classified: Secondary | ICD-10-CM

## 2016-07-23 DIAGNOSIS — F1910 Other psychoactive substance abuse, uncomplicated: Secondary | ICD-10-CM

## 2016-07-23 DIAGNOSIS — L089 Local infection of the skin and subcutaneous tissue, unspecified: Secondary | ICD-10-CM

## 2016-07-23 LAB — CBC
HEMATOCRIT: 35.4 % — AB (ref 39.0–52.0)
Hemoglobin: 12 g/dL — ABNORMAL LOW (ref 13.0–17.0)
MCH: 30.4 pg (ref 26.0–34.0)
MCHC: 33.9 g/dL (ref 30.0–36.0)
MCV: 89.6 fL (ref 78.0–100.0)
PLATELETS: 360 10*3/uL (ref 150–400)
RBC: 3.95 MIL/uL — AB (ref 4.22–5.81)
RDW: 12.6 % (ref 11.5–15.5)
WBC: 15.5 10*3/uL — AB (ref 4.0–10.5)

## 2016-07-23 LAB — BASIC METABOLIC PANEL
Anion gap: 5 (ref 5–15)
BUN: 14 mg/dL (ref 6–20)
CALCIUM: 9 mg/dL (ref 8.9–10.3)
CHLORIDE: 106 mmol/L (ref 101–111)
CO2: 28 mmol/L (ref 22–32)
CREATININE: 0.93 mg/dL (ref 0.61–1.24)
Glucose, Bld: 59 mg/dL — ABNORMAL LOW (ref 65–99)
Potassium: 4 mmol/L (ref 3.5–5.1)
SODIUM: 139 mmol/L (ref 135–145)

## 2016-07-23 LAB — GLUCOSE, CAPILLARY
GLUCOSE-CAPILLARY: 56 mg/dL — AB (ref 65–99)
GLUCOSE-CAPILLARY: 158 mg/dL — AB (ref 65–99)
GLUCOSE-CAPILLARY: 179 mg/dL — AB (ref 65–99)
GLUCOSE-CAPILLARY: 95 mg/dL (ref 65–99)
Glucose-Capillary: 46 mg/dL — ABNORMAL LOW (ref 65–99)
Glucose-Capillary: 63 mg/dL — ABNORMAL LOW (ref 65–99)
Glucose-Capillary: 250 mg/dL — ABNORMAL HIGH (ref 65–99)
Glucose-Capillary: 216 mg/dL — ABNORMAL HIGH (ref 65–99)

## 2016-07-23 LAB — HIV ANTIBODY (ROUTINE TESTING W REFLEX): HIV SCREEN 4TH GENERATION: NONREACTIVE

## 2016-07-23 MED ORDER — LORAZEPAM 2 MG/ML IJ SOLN
1.0000 mg | Freq: Once | INTRAMUSCULAR | Status: AC
  Administered 2016-07-23: 1 mg via INTRAVENOUS
  Filled 2016-07-23: qty 1

## 2016-07-23 MED ORDER — FENTANYL CITRATE (PF) 100 MCG/2ML IJ SOLN
25.0000 ug | Freq: Four times a day (QID) | INTRAMUSCULAR | Status: DC | PRN
  Administered 2016-07-23 – 2016-07-25 (×3): 50 ug via INTRAVENOUS
  Filled 2016-07-23 (×3): qty 2

## 2016-07-23 MED ORDER — INSULIN DETEMIR 100 UNIT/ML ~~LOC~~ SOLN
15.0000 [IU] | Freq: Every day | SUBCUTANEOUS | Status: DC
  Administered 2016-07-24 – 2016-07-25 (×2): 15 [IU] via SUBCUTANEOUS
  Filled 2016-07-23 (×5): qty 0.15

## 2016-07-23 MED ORDER — GADOBENATE DIMEGLUMINE 529 MG/ML IV SOLN: 15.0000 mL | Freq: Once | INTRAVENOUS | Status: DC | PRN

## 2016-07-23 MED ORDER — IOPAMIDOL (ISOVUE-300) INJECTION 61%
INTRAVENOUS | Status: AC
  Administered 2016-07-23: 75 mL
  Filled 2016-07-23: qty 75

## 2016-07-23 MED ORDER — DEXTROSE 50 % IV SOLN
INTRAVENOUS | Status: AC
  Filled 2016-07-23: qty 50

## 2016-07-23 MED ORDER — HYDRALAZINE HCL 20 MG/ML IJ SOLN: 5.0000 mg | INTRAMUSCULAR | Status: DC | PRN

## 2016-07-23 MED ORDER — GABAPENTIN 100 MG PO CAPS
100.0000 mg | ORAL_CAPSULE | Freq: Three times a day (TID) | ORAL | Status: DC
  Administered 2016-07-23 – 2016-07-24 (×6): 100 mg via ORAL
  Filled 2016-07-23 (×6): qty 1

## 2016-07-23 MED ORDER — DEXTROSE 50 % IV SOLN
1.0000 | Freq: Once | INTRAVENOUS | Status: AC
  Administered 2016-07-23: 50 mL via INTRAVENOUS

## 2016-07-23 MED ORDER — KETOROLAC TROMETHAMINE 30 MG/ML IJ SOLN
30.0000 mg | Freq: Three times a day (TID) | INTRAMUSCULAR | Status: AC | PRN
  Administered 2016-07-25 – 2016-07-28 (×4): 30 mg via INTRAVENOUS
  Filled 2016-07-23 (×5): qty 1

## 2016-07-23 MED ORDER — NICOTINE 14 MG/24HR TD PT24
14.0000 mg | MEDICATED_PATCH | Freq: Every day | TRANSDERMAL | Status: DC
  Administered 2016-07-27: 14 mg via TRANSDERMAL
  Filled 2016-07-23 (×3): qty 1

## 2016-07-23 MED ORDER — OXYCODONE HCL ER 10 MG PO T12A
10.0000 mg | EXTENDED_RELEASE_TABLET | Freq: Two times a day (BID) | ORAL | Status: DC
  Administered 2016-07-23 – 2016-07-24 (×4): 10 mg via ORAL
  Filled 2016-07-23 (×4): qty 1

## 2016-07-23 NOTE — Progress Notes (Signed)
Patient is off the floor to CT.

## 2016-07-23 NOTE — Progress Notes (Signed)
MD notified regarding pt's hypoglycemic event. Pt asymptomatic. CBG now 95. Per MD, will give 1 ampule of D50 now.

## 2016-07-23 NOTE — Progress Notes (Signed)
Dr. Marily Memos has been paged to make him aware of patient situation in MRI.

## 2016-07-23 NOTE — Progress Notes (Signed)
Pt. with a CBG of 56 this am. Carbs given. Pt. stable. Asymptomatic. Will recheck and continue to monitor.On call for Cardiology paged. RN will continue to monitor. Watanabe, Katherine Roan

## 2016-07-23 NOTE — Progress Notes (Addendum)
Inpatient Diabetes Program Recommendations  AACE/ADA: New Consensus Statement on Inpatient Glycemic Control (2015)  Target Ranges:  Prepandial:   less than 140 mg/dL      Peak postprandial:   less than 180 mg/dL (1-2 hours)      Critically ill patients:  140 - 180 mg/dL   Lab Results  Component Value Date   GLUCAP 158 (H) 07/23/2016   HGBA1C 8.8 05/20/2016     Review of Glycemic Control  Diabetes history: DM1 (patient makes no insulin, so will need both basal and meal coverage insulin)  Outpatient Diabetes medications: NPH 50 units every morning  Current orders for Inpatient glycemic control: NPH 15 units in morning and 10 units @ HS, resistant correction scale Novolog 0-20 units TIDAC and 0-5 units QHS  Inpatient Diabetes Program Recommendations:   Correction (SSI): Please consider decreasing to sensitive correction scale.  Patient has DM1 and should be sensitive to insulin.  Insulin - Meal Coverage: Please consider Novolog 3 units TID AC if patient eats > 50% of meal.  Patient has DM1 and produces no insulin to cover carbohydrates eaten.  Insulin - Basal: Noted that no basal insulin was given up to this point.  Please consider giving Levemir 14 units daily starting now (20% of weight in Kg).  Text page sent to Dr. Marily Memos.  Thank you,  Windy Carina, RN, MSN Diabetes Coordinator Inpatient Diabetes Program [phone] (Team Pager)

## 2016-07-23 NOTE — Progress Notes (Addendum)
PROGRESS NOTE    [name]  ZYS:[id number] DOB: 15-Aug-1955 DOA: 07/22/2016 PCP: Renato Shin, MD   Brief Narrative:  Pt admitted on 07/22/16 directly from Dr. Evette Georges office. Accepted pt to [facility] service on 07/23/16 after discussing pt condition w/ Dr. Radford Pax.    As noted from his H&P [name] is a 61 y.o. male with a history of DM1 (uncontrolled), hep C, HTN, depression, drug abuse, tob use. He smoked crack last 3 days ago.  He has been having problems with his R great toe for over a month. It started after he was hospitalized with DKA and SIRS in January 2018. He had a R heel ulcer in January and was on antibiotics. He saw Dr Loanne Drilling 02/05 and was referred to a a wound specialist for the heel wound. He then developed the right great toe wound. The right heel wound is blackened but not draining and not eating larger. The right great toe wound has been worsening steadily. It has grown more swollen, or reddened, started blackening and his foot has become swollen.  In the last 3 or 4 days, he has developed chills and subjective fevers. He has general malaise and generally feels very bad. He is unable to walk on the foot. He has a supportive shoe and a walker that he uses to get around.  He is not having any cardiac symptoms. Specifically, he is not having any chest pain, no new dyspnea on exertion and shortness of breath. He denies orthopnea or PND. He is not having palpitations. He is having weakness and some dizziness. He has not been checking his blood sugars.  He has been smoking marijuana which helps with the pain.  At time of my examination on 07/23/16 pt still complaining of general malaise and chills with diffuse pain in his right foot with intermittent sharp stabbing pain across the midfoot. Patient endorses long-standing history of numbness and tingling in hands and feet. Patient denies any home use of narcotics to treat his pain. Patient denies any follow-up from previous vascular  studies that he had performed.  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 h/o PAD. Likely to need. AFVSS. WBC improving from 17.5-15.5.  - Vanc 07/22/16>>> - Cefepime 07/22/16 >>>  - MRI - ABI - Vascular/ortho consult once studies complete - BCX (ABX started 24hrs prior to obtaining unfortunately)  - increase pain regimen w/ OxyIR, fentanyl, Toradol - PT/OT/Wound  Diabetes: last A1c 8.8 - Continue SSI - Adding Levemir 15 units daily as pt has had this before w/o difficulty at <25 units.  PAD: previous studies in 2015 showing stenotic and occlusive disease bilaterally in LE.  - Workup as above - Lipid panel  Peripheral neuropathy: self treating w/ marijuana - Start Neurontin 100 TID - mgt of PAD/DM/HTN  HTN: Intermittent and no h/o the same. May be from pain. - Hydralazine PRN - conisder initiating long term therapy prior to DC if remains elevated.   Chronic Hepatitis: unsure of last appt.  - Hep panel - CMET  Tobacco/cocaine abuse: 40+pack year smoker. Endorses recent THC and cocaine use.  - NIcotine patch - UDS   DVT prophylaxis: Lovenox Code Status: Full  Family Communication: None Disposition Plan: pending studies and eval by vascular/ortho   Consultants:   Vascular and Ortho pending - still need to call.  Procedures:   MRI  ABI  Antimicrobials: (specify start and planned stop date. Auto populated tables are space occupying and do not give end dates)  Vanc 4/9 >>>  Cefepime 4/9 >>>    Subjective: Patient endorses no change in overall subjective symptoms including general malaise and chills with subjective fevers. Patient's ongoing neuropathic changes in  hands and feet is unchanged and constant. Right lower extremity pain remains severe causing inability to ambulate. Intermittent sharp stabbing pains circumflex lesion around the right midfoot multiple times per day. Denies any purulent discharge from the foot. Currently denying any chest pain palpitations shortness of breath since headache.  Objective: Vitals:   07/22/16 1726 07/22/16 2100 07/23/16 0442 07/23/16 0838  BP: (!) 161/74 (!) 164/74 (!) 163/69 (!) 161/63  Pulse: 93 89 84 86  Resp: 16 18 16 18   Temp: 99.1 F (37.3 C) 98.9 F (37.2 C) 98.3 F (36.8 C) 97.5 F (36.4 C)  TempSrc: Oral Oral Oral Oral  SpO2: 98% 95% 97% 100%  Weight:  71.5 kg (157 lb 11.2 oz) 70.8 kg (156 lb 1.6 oz)     Intake/Output Summary (Last 24 hours) at 07/23/16 1127 Last data filed at 07/23/16 0842  Gross per 24 hour  Intake              940 ml  Output              250 ml  Net              690 ml   Filed Weights   07/22/16 2100 07/23/16 0442  Weight: 71.5 kg (157 lb 11.2 oz) 70.8 kg (156 lb 1.6 oz)    Examination:  General exam: Appears calm and comfortable  Respiratory system: Clear to auscultation. Respiratory effort normal. Cardiovascular system: S1 & S2 heard, RRR. No JVD, murmurs, rubs, gallops or clicks. No pedal edema. Gastrointestinal system: Abdomen is nondistended, soft and nontender. No organomegaly or masses felt. Normal bowel sounds heard. Central nervous system: Alert and oriented. No focal neurological deficits. Extremities: Symmetric 5 x 5 power. Skin: Right foot edematous and erythema from toes to ankle with dry gangrene of the right great toe tip and right heel. Psychiatry: Judgement and insight appear normal. Mood & affect appropriate.     Data Reviewed: I have personally reviewed following labs and imaging studies  CBC:  Recent Labs Lab 07/22/16 1809 07/23/16 0704  WBC 17.5* 15.5*  NEUTROABS 13.9*  --   HGB 12.0* 12.0*  HCT 34.4* 35.4*  MCV 88.4 89.6  PLT 339  297   Basic Metabolic Panel:  Recent Labs Lab 07/22/16 1809 07/23/16 0704  NA 137 139  K 4.0 4.0  CL 102 106  CO2 27 28  GLUCOSE 115* 59*  BUN 13 14  CREATININE 1.05 0.93  CALCIUM 9.1 9.0   GFR: Estimated Creatinine Clearance: 85.6 mL/min (by C-G formula based on SCr of 0.93 mg/dL). Liver Function Tests:  Recent Labs Lab 07/22/16 1809  AST 30  ALT 23  ALKPHOS 77  BILITOT 0.2*  PROT 6.6  ALBUMIN 2.5*   No results for input(s): LIPASE, AMYLASE in the last 168 hours. No results for input(s): AMMONIA in the last 168 hours. Coagulation Profile: No results for input(s): INR, PROTIME in the last 168 hours. Cardiac Enzymes: No results for input(s): CKTOTAL, CKMB,  CKMBINDEX, TROPONINI in the last 168 hours. BNP (last 3 results) No results for input(s): PROBNP in the last 8760 hours. HbA1C: No results for input(s): HGBA1C in the last 72 hours. CBG:  Recent Labs Lab 07/22/16 1719 07/22/16 2137 07/23/16 0651 07/23/16 0838  GLUCAP 118* 276* 56* 158*   Lipid Profile: No results for input(s): CHOL, HDL, LDLCALC, TRIG, CHOLHDL, LDLDIRECT in the last 72 hours. Thyroid Function Tests: No results for input(s): TSH, T4TOTAL, FREET4, T3FREE, THYROIDAB in the last 72 hours. Anemia Panel: No results for input(s): VITAMINB12, FOLATE, FERRITIN, TIBC, IRON, RETICCTPCT in the last 72 hours. Sepsis Labs: No results for input(s): PROCALCITON, LATICACIDVEN in the last 168 hours.  Recent Results (from the past 240 hour(s))  MRSA PCR Screening     Status: None   Collection Time: 07/22/16  5:42 PM  Result Value Ref Range Status   MRSA by PCR NEGATIVE NEGATIVE Final    Comment:        The GeneXpert MRSA Assay (FDA approved for NASAL specimens only), is one component of a comprehensive MRSA colonization surveillance program. It is not intended to diagnose MRSA infection nor to guide or monitor treatment for MRSA infections.          Radiology Studies: No results  found.      Scheduled Meds: . ceFEPime (MAXIPIME) IV  2 g Intravenous Q12H  . enoxaparin (LOVENOX) injection  40 mg Subcutaneous Q24H  . folic acid  1 mg Oral Daily  . gabapentin  100 mg Oral TID  . insulin aspart  0-20 Units Subcutaneous TID WC  . insulin aspart  0-5 Units Subcutaneous QHS  . insulin NPH Human  10 Units Subcutaneous QHS  . insulin NPH Human  15 Units Subcutaneous QAC breakfast  . multivitamin with minerals  1 tablet Oral Daily  . nicotine  14 mg Transdermal Daily  . oxyCODONE  10 mg Oral Q12H  . thiamine  100 mg Oral Daily  . vancomycin  1,000 mg Intravenous Q12H   Continuous Infusions:   LOS: 1 day       Merrillyn Ackerley, [place]ling Congress, MD [name] Pager [phone]  If 7PM-7AM, please contact night-coverage [email or web address] Password [facility] 07/23/2016, 11:27 AM

## 2016-07-23 NOTE — Progress Notes (Signed)
Patient had to be medicated while down in MRI for pain.

## 2016-07-23 NOTE — Progress Notes (Signed)
Pt came down for MRI for foot exam.  Pt was very somnolent had been medicated prior to exam.  Within minutes of being in the scanner pt started screaming that he was in terrible pain.  RN called and pt came and gave him IV fentanyl.  Pt was very somnolent while out of the scanner and when we put him back in again within minutes he started screaming he was in agony.  Looking at his films, he has a metallic FB in the fleshy area of his foot and talking to the radiologist we came to the conclusion that this object is ferrous and is turning (torquing) to line up with the magnet and causing the patient tremendous pain.  No images were able to be assessed, if MRI is desired it will have to be after FB removal or under general anesthesia.

## 2016-07-23 NOTE — Consult Note (Addendum)
[place] Nurse wound consult note Reason for Consult: Consult requested for right toe and heel.  Right great toe with necrotic end, affected area is approx 4X5cm; generalized edema and erythema which is extending to the anterior foot area.  This is beyond [facility] scope of practice since topical treatment will be minimally effective; recommend ortho or vascular consult for further input and pt could benefit from MRI to r/o osteomyelitis. Discussed plan of care with primary team at the bedside. Wound type: Right heel with unstageable wound; 100% eschar surrounded by dry calloused skin; affected area is approx 5X6cm. No odor, drainage, or fluctuance.   Pressure Injury POA: Yes Dressing procedure/placement/frequency: Foam dressing applied to heel until further input is available from ortho or vascular  Service regarding right great toe and right heel wound. Please re-consult if further assistance is needed.  Thank-you,  Julien Girt MSN, [place], [place], [place], [place]

## 2016-07-23 NOTE — Progress Notes (Signed)
    Made aware of foreign body in pts foot preventing MRI. Will change study to CT. Additionally discussed case with Lonn Georgia of [facility] who will have Dr. Lynann Bologna see the pt in consultations.   Linna Darner, MD [facility] 07/23/2016, 2:34 PM

## 2016-07-23 NOTE — Progress Notes (Signed)
VASCULAR LAB PRELIMINARY  ARTERIAL  ABI completed: Right sided ABI moderate arterial insufficiency at rest. Left sided ABI, non compressible vessels.     RIGHT    LEFT    PRESSURE WAVEFORM  PRESSURE WAVEFORM  BRACHIAL 187 Triphasic BRACHIAL 183 Triphasic  DP 115 monophasic DP 255 Biphsic  PT 113 Monophasic PT 255 Biphsic  PER   PER    GREAT TOE NA NA GREAT TOE 0.53 NA    RIGHT LEFT  ABI 0.61 Non compressible vessels     [name], [name], RVT 07/23/2016, 2:44 PM

## 2016-07-23 NOTE — Progress Notes (Signed)
Progress Note  Patient Name: [name] Date of Encounter: 07/23/2016  Primary Cardiologist: Dr. Claiborne Billings   Subjective   Complaining of pain in foot. No fever or chills.   Inpatient Medications    Scheduled Meds: . ceFEPime (MAXIPIME) IV  2 g Intravenous Q12H  . enoxaparin (LOVENOX) injection  40 mg Subcutaneous Q24H  . folic acid  1 mg Oral Daily  . insulin aspart  0-20 Units Subcutaneous TID WC  . insulin aspart  0-5 Units Subcutaneous QHS  . insulin NPH Human  10 Units Subcutaneous QHS  . insulin NPH Human  15 Units Subcutaneous QAC breakfast  . multivitamin with minerals  1 tablet Oral Daily  . thiamine  100 mg Oral Daily   Or  . thiamine  100 mg Intravenous Daily  . vancomycin  1,000 mg Intravenous Q12H   Continuous Infusions:  PRN Meds: acetaminophen, diphenhydrAMINE, LORazepam **OR** LORazepam, nitroGLYCERIN, ondansetron (ZOFRAN) IV, traMADol, zolpidem   Vital Signs    Vitals:   07/22/16 1726 07/22/16 2100 07/23/16 0442  BP: (!) 161/74 (!) 164/74 (!) 163/69  Pulse: 93 89 84  Resp: 16 18 16   Temp: 99.1 F (37.3 C) 98.9 F (37.2 C) 98.3 F (36.8 C)  TempSrc: Oral Oral Oral  SpO2: 98% 95% 97%  Weight:  157 lb 11.2 oz (71.5 kg) 156 lb 1.6 oz (70.8 kg)    Intake/Output Summary (Last 24 hours) at 07/23/16 0825 Last data filed at 07/23/16 0709  Gross per 24 hour  Intake              580 ml  Output              250 ml  Net              330 ml   Filed Weights   07/22/16 2100 07/23/16 0442  Weight: 157 lb 11.2 oz (71.5 kg) 156 lb 1.6 oz (70.8 kg)    Telemetry    NSR - Personally Reviewed  ECG    NSR - Personally Reviewed  Physical Exam   GEN: No acute distress.   Neck: No JVD Cardiac: RRR, no murmurs, rubs, or gallops.  Respiratory: Clear to auscultation bilaterally. GI: Soft, nontender, non-distended  MS: gangrenous right great toe, dorsal aspect of right foot erythematous, ulcer on bottom of right foot, decreased distal pulses  Neuro:   Nonfocal  Psych: Normal affect   Labs    Chemistry Recent Labs Lab 07/22/16 1809 07/23/16 0704  NA 137 139  K 4.0 4.0  CL 102 106  CO2 27 28  GLUCOSE 115* 59*  BUN 13 14  CREATININE 1.05 0.93  CALCIUM 9.1 9.0  PROT 6.6  --   ALBUMIN 2.5*  --   AST 30  --   ALT 23  --   ALKPHOS 77  --   BILITOT 0.2*  --   GFRNONAA >60 >60  GFRAA >60 >60  ANIONGAP 8 5     Hematology Recent Labs Lab 07/22/16 1809 07/23/16 0704  WBC 17.5* 15.5*  RBC 3.89* 3.95*  HGB 12.0* 12.0*  HCT 34.4* 35.4*  MCV 88.4 89.6  MCH 30.8 30.4  MCHC 34.9 33.9  RDW 12.8 12.6  PLT 339 360    Cardiac EnzymesNo results for input(s): TROPONINI in the last 168 hours. No results for input(s): TROPIPOC in the last 168 hours.   BNPNo results for input(s): BNP, PROBNP in the last 168 hours.   DDimer No results for  input(s): DDIMER in the last 168 hours.   Radiology    No results found.   Patient Profile     61 y.o. male with a h/o DM1 (uncontrolled), hep C, HTN, depression, polysubstance abuse including tobacco and cocaine and suspected PVD, admitted from the office 07/22/16 for a nonhealing right great toe wound with possible gangrene vs cellulitis.   Assessment & Plan    1. Nonhealing Right Great Toe Wound: in the setting of poorly controlled Type 1 DM and possible PVD. Continue IV antibiotics. We will ask IM to assist with management of his DM. He will require a bone scan as well as vascular surgical evaluation and may ultimately require amputation.   Signed, Lyda Jester, PA-C  07/23/2016, 8:25 AM

## 2016-07-23 NOTE — Progress Notes (Signed)
Off the floor for procedure.

## 2016-07-23 NOTE — Progress Notes (Signed)
Spoke with patient about diabetes and home regimen for diabetes control. Patient reports that he has DM1 is followed by Dr. Loanne Drilling for diabetes management. Patient currently takes NPH 50 units QAM as an outpatient for diabetes control. Patient states that he is taking insulin as prescribed by his doctor. Inquired about prior insulin(s) used for DM control and patient states that he has taken other insulins but he is only taking the NPH once a day now "because I am doing what my doctor told me to do."  Explained that with DM1 patient's  make no insulin at all and typically should be taking basal, correction, and carbohydrate coverage. Patient states that he does not want to take multiple (3-4) insulin injections.  Inquired about glucose trends at home and patient stated "they are good."  However, in further talking with patient he stated that he has not checked ins glucose in a couple of weeks, due to "uncontrolled pain and I just didn't care."  Discussed importance of checking CBGs and maintaining good CBG control to prevent long-term and short-term complications.  Stressed to the patient the importance of improving glycemic control to prevent further complications from uncontrolled diabetes especially for wound healing. Patient reports that he knows his DM needs to be controlled. Patient reported that he gets his insulin from [employer] (Novolin NPH) since it is more affordable. Patient reports that he would be willing to take insulin injections twice a day if MD prescribes. Patient verbalized understanding of information discussed and he states that he has no further questions at this time related to diabetes.  Question if patient would have improved DM control with using Novolin 70/30 insulin BID dosing since NPH once a day is likely not lasting 24 hours and only provides basal insulin..  Thanks, Barnie Alderman, RN, MSN, CDE Diabetes Coordinator Inpatient Diabetes Program [phone] (Team Pager)

## 2016-07-24 DIAGNOSIS — L97514 Non-pressure chronic ulcer of other part of right foot with necrosis of bone: Secondary | ICD-10-CM

## 2016-07-24 DIAGNOSIS — I70235 Atherosclerosis of native arteries of right leg with ulceration of other part of foot: Secondary | ICD-10-CM

## 2016-07-24 DIAGNOSIS — E1169 Type 2 diabetes mellitus with other specified complication: Secondary | ICD-10-CM

## 2016-07-24 DIAGNOSIS — I70234 Atherosclerosis of native arteries of right leg with ulceration of heel and midfoot: Secondary | ICD-10-CM

## 2016-07-24 LAB — COMPREHENSIVE METABOLIC PANEL
ALBUMIN: 2.4 g/dL — AB (ref 3.5–5.0)
ALK PHOS: 71 U/L (ref 38–126)
ALT: 23 U/L (ref 17–63)
ANION GAP: 8 (ref 5–15)
AST: 25 U/L (ref 15–41)
BUN: 9 mg/dL (ref 6–20)
CHLORIDE: 99 mmol/L — AB (ref 101–111)
CO2: 27 mmol/L (ref 22–32)
Calcium: 9.2 mg/dL (ref 8.9–10.3)
Creatinine, Ser: 0.99 mg/dL (ref 0.61–1.24)
GFR calc Af Amer: >60 mL/min (ref >60)
GFR calc non Af Amer: >60 mL/min (ref >60)
GLUCOSE: 147 mg/dL — AB (ref 65–99)
POTASSIUM: 4.2 mmol/L (ref 3.5–5.1)
SODIUM: 134 mmol/L — AB (ref 135–145)
Total Bilirubin: 0.5 mg/dL (ref 0.3–1.2)
Total Protein: 6.6 g/dL (ref 6.5–8.1)

## 2016-07-24 LAB — CBC
HCT: 36.3 % — ABNORMAL LOW (ref 39.0–52.0)
HEMOGLOBIN: 12.3 g/dL — AB (ref 13.0–17.0)
MCH: 30.3 pg (ref 26.0–34.0)
MCHC: 33.9 g/dL (ref 30.0–36.0)
MCV: 89.4 fL (ref 78.0–100.0)
Platelets: 397 10*3/uL (ref 150–400)
RBC: 4.06 MIL/uL — ABNORMAL LOW (ref 4.22–5.81)
RDW: 12.5 % (ref 11.5–15.5)
WBC: 16 10*3/uL — ABNORMAL HIGH (ref 4.0–10.5)

## 2016-07-24 LAB — GLUCOSE, CAPILLARY
GLUCOSE-CAPILLARY: 203 mg/dL — AB (ref 65–99)
GLUCOSE-CAPILLARY: 124 mg/dL — AB (ref 65–99)
GLUCOSE-CAPILLARY: 108 mg/dL — AB (ref 65–99)
Glucose-Capillary: 217 mg/dL — ABNORMAL HIGH (ref 65–99)

## 2016-07-24 LAB — LIPID PANEL
CHOL/HDL RATIO: 4.4 ratio
Cholesterol: 162 mg/dL (ref 0–200)
HDL: 37 mg/dL — ABNORMAL LOW (ref >40)
LDL CALC: 102 mg/dL — AB (ref 0–99)
Triglycerides: 115 mg/dL (ref <150)
VLDL: 23 mg/dL (ref 0–40)

## 2016-07-24 LAB — SEDIMENTATION RATE

## 2016-07-24 LAB — C-REACTIVE PROTEIN: CRP: 6.7 mg/dL — ABNORMAL HIGH (ref <1.0)

## 2016-07-24 NOTE — Evaluation (Signed)
Occupational Therapy Evaluation Patient Details Name: [name] MRN: [id number] DOB: Oct 17, 1955 Today's Date: 07/24/2016    History of Present Illness 61 y.o.malewith a history of DM1 (uncontrolled), hep C, HTN, depression, drug abuse, tob use. Pt with diabetic foot infection - R great toe.  Pt with older heel wound from Jan 18   Clinical Impression   Pt admitted with R diabetic foot infection. Pt currently with functional limitations due to the deficits listed below (see OT Problem List). Pt will benefit from skilled OT to increase their safety and independence with ADL and functional mobility for ADL to facilitate discharge to venue listed below. Pt agitated during eval and refused to use walker. Explained benefits of walker and pt refused.       Follow Up Recommendations  SNF    Equipment Recommendations  3 in 1 bedside commode    Recommendations for Other Services       Precautions / Restrictions        Mobility Bed Mobility Overal bed mobility: Needs Assistance Bed Mobility: Supine to Sit;Sit to Supine     Supine to sit: Supervision Sit to supine: Supervision      Transfers Overall transfer level: Needs assistance Equipment used: Rolling walker (2 wheeled) Transfers: Sit to/from [facility] Sit to Stand: Min assist Stand pivot transfers: Mod assist       General transfer comment: pt putting weight on R foot and refuse to use the walker or darco shoe.    Balance                                           ADL either performed or assessed with clinical judgement   ADL Overall ADL's : Needs assistance/impaired                         Toilet Transfer: Minimal assistance;Ambulation;Cueing for safety Toilet Transfer Details (indicate cue type and reason): did not follow OT's cues for safety Toileting- Clothing Manipulation and Hygiene: Minimal assistance;Sit to/from stand;Cueing for safety         General ADL  Comments: Upon OT arriving pt had walked ot bathroom using IV pole, no socks, putting weight on R foot and did not have on [facility] shoe- although it was with in reach.  OT educAted pt on use of walker and pt stated he was angry and refused to use.       Vision Patient Visual Report: No change from baseline              Pertinent Vitals/Pain Pain Assessment: 0-10 Pain Score: 10-Worst pain ever Pain Location: R foot Pain Descriptors / Indicators: Shooting;Burning Pain Intervention(s): Limited activity within patient's tolerance;Monitored during session;Repositioned;Patient requesting pain meds-RN notified     Hand Dominance     Extremity/Trunk Assessment Upper Extremity Assessment Upper Extremity Assessment: Generalized weakness           Communication Communication Communication: No difficulties   Cognition Arousal/Alertness: Awake/alert Behavior During Therapy: Agitated Overall Cognitive Status: Within Functional Limits for tasks assessed                                     General Comments   pt with limited participation. Pt states he has no A at home - that  his sons are worthless.            Home Living Family/patient expects to be discharged to:: Private residence Living Arrangements: Alone   Type of Home: Mobile home Home Access: Stairs to enter Entrance Stairs-Number of Steps: 2   Home Layout: One level     Bathroom Shower/Tub: Teacher, early years/pre: Standard     Home Equipment: None   Additional Comments: pt was borrowing a walker      Prior Functioning/Environment Level of Independence: Needs assistance        Comments: sons assisted  pt but he did not state what they did for him        OT Problem List: Decreased activity tolerance;Decreased strength;Pain;Decreased safety awareness;Impaired balance (sitting and/or standing)      OT Treatment/Interventions: Self-care/ADL training;Patient/family education;DME  and/or AE instruction    OT Goals(Current goals can be found in the care plan section) Acute Rehab OT Goals Patient Stated Goal: to get out of here OT Goal Formulation: With patient Time For Goal Achievement: 08/07/16 Potential to Achieve Goals: Good  OT Frequency: Min 2X/week   Barriers to D/C:               End of Session Equipment Utilized During Treatment: Surveyor, mining Communication: Mobility status  Activity Tolerance: Treatment limited secondary to agitation Patient left: in bed;with call bell/phone within reach  OT Visit Diagnosis: Unsteadiness on feet (R26.81);Pain;Repeated falls (R29.6);History of falling (Z91.81) Pain - Right/Left: Right Pain - part of body: Ankle and joints of foot                Time: 1320-1335 OT Time Calculation (min): 15 min Charges:  OT General Charges $OT Visit: 1 Procedure OT Evaluation $OT Eval Moderate Complexity: 1 Procedure G-Codes:     Kari Baars, OT [phone]  Payton Mccallum D 07/24/2016, 2:06 PM

## 2016-07-24 NOTE — Progress Notes (Signed)
PT Cancellation Note  Patient Details Name: NATION CRADLE MRN: [id number] DOB: Aug 04, 1955   Cancelled Treatment:    Reason Eval/Treat Not Completed: Patient declined, no reason specified. Pt refusing to participate in PT evaluation at this time, despite max encouragement from therapist. Pt stated "I just don't feel like doing it right now". PT will continue to follow up with pt as available and appropriate.    [place] 07/24/2016, 2:47 PM

## 2016-07-24 NOTE — Progress Notes (Signed)
PROGRESS NOTE    [name]  CXK:[id number] DOB: Feb 17, 1956 DOA: 07/22/2016 PCP: Renato Shin, MD   Brief Narrative:  Pt admitted on 07/22/16 directly from Dr. Evette Georges office. Accepted pt to [facility] service on 07/23/16 after discussing pt condition w/ Dr. Radford Pax.    As noted from his H&P [name] is a 61 y.o. male with a history of DM1 (uncontrolled), hep C, HTN, depression, drug abuse, tob use. He smoked crack last 3 days ago.  He has been having problems with his R great toe for over a month. It started after he was hospitalized with DKA and SIRS in January 2018. He had a R heel ulcer in January and was on antibiotics. He saw Dr Loanne Drilling 02/05 and was referred to a a wound specialist for the heel wound. He then developed the right great toe wound. The right heel wound is blackened but not draining and not eating larger. The right great toe wound has been worsening steadily. It has grown more swollen, or reddened, started blackening and his foot has become swollen.  In the last 3 or 4 days, he has developed chills and subjective fevers. He has general malaise and generally feels very bad. He is unable to walk on the foot. He has a supportive shoe and a walker that he uses to get around.  He is not having any cardiac symptoms. Specifically, he is not having any chest pain, no new dyspnea on exertion and shortness of breath. He denies orthopnea or PND. He is not having palpitations. He is having weakness and some dizziness. He has not been checking his blood sugars.  He has been smoking marijuana which helps with the pain.  At time of my examination on 07/23/16 pt still complaining of general malaise and chills with diffuse pain in his right foot with intermittent sharp stabbing pain across the midfoot. Patient endorses long-standing history of numbness and tingling in hands and feet. Patient denies any home use of narcotics to treat his pain. Patient denies any follow-up from previous vascular  studies that he had performed.  Assessment & Plan:  R foot dry gangrene and cellulitis: Pulses present on exam. h/o PAD. Likely to need. AFVSS. WBC improving from 17.5-15.5.  - Vanc 07/22/16>>> - Cefepime 07/22/16 >>>  - Cannot get an MRI, received CT scan with contrast which is positive for osteomyelitis. Orthopedics as well as vascular surgery consulted. - ABI positive, awaiting vascular surgery recommendation - BCX (ABX started 24hrs prior to obtaining unfortunately)  - Continue current pain regimen w/ OxyIR, fentanyl, Toradol - PT/OT/Wound  Diabetes: last A1c 8.8 - Continue SSI - Adding Levemir 15 units daily as pt has had this before w/o difficulty at <25 units.  PAD: previous studies in 2015 showing stenotic and occlusive disease bilaterally in LE.  - Workup as above - Lipid panel  Peripheral neuropathy: self treating w/ marijuana - Start Neurontin 100 TID - mgt of PAD/DM/HTN  HTN: Intermittent and no h/o the same. May be from pain. - Hydralazine PRN - conisder initiating long term therapy prior to DC if remains elevated.   Chronic Hepatitis: unsure of last appt.  - Hep panel - CMET  Tobacco/cocaine abuse: 40+pack year smoker. Endorses recent THC and cocaine use.  - NIcotine patch - UDS   DVT prophylaxis: Lovenox Code Status: Full Family Communication: None Disposition Plan: pending studies and eval by vascular/ortho   Consultants:   Vascular and Ortho consulted  Procedures:   None  Antimicrobials: (  specify start and planned stop date. Auto populated tables are space occupying and do not give end dates)  Vanc 4/9 >>>  Cefepime 4/9 >>>    Subjective: No change in symptoms, continued to have pain, no nausea or vomiting.  Objective: Vitals:   07/23/16 1128 07/23/16 1940 07/24/16 0346 07/24/16 1200  BP: (!) 175/97 (!) 155/68 (!) 143/57 (!) 148/72  Pulse: 87 85 93 83  Resp: 20 18 16 18   Temp: 97.6 F (36.4 C) 99.3 F (37.4 C) 98.2 F (36.8 C) 98.2  F (36.8 C)  TempSrc: Oral Oral Oral Oral  SpO2: 100% 96% 94% 100%  Weight:   70.6 kg (155 lb 11.2 oz)   Height:        Intake/Output Summary (Last 24 hours) at 07/24/16 1756 Last data filed at 07/24/16 1300  Gross per 24 hour  Intake             1420 ml  Output                0 ml  Net             1420 ml   Filed Weights   07/22/16 2100 07/23/16 0442 07/24/16 0346  Weight: 71.5 kg (157 lb 11.2 oz) 70.8 kg (156 lb 1.6 oz) 70.6 kg (155 lb 11.2 oz)    Examination:  General exam: Appears calm and comfortable  Respiratory system: Clear to auscultation. Respiratory effort normal. Cardiovascular system: S1 & S2 heard, RRR. No JVD, murmurs, rubs, gallops or clicks. No pedal edema. Gastrointestinal system: Abdomen is nondistended, soft and nontender. No organomegaly or masses felt. Normal bowel sounds heard. Central nervous system: Alert and oriented. No focal neurological deficits. Extremities: Symmetric 5 x 5 power. Skin: Right foot edematous and erythema from toes to ankle with dry gangrene of the right great toe tip and right heel. Psychiatry: Judgement and insight appear normal. Mood & affect appropriate.     Data Reviewed: I have personally reviewed following labs and imaging studies  CBC:  Recent Labs Lab 07/22/16 1809 07/23/16 0704 07/24/16 0329  WBC 17.5* 15.5* 16.0*  NEUTROABS 13.9*  --   --   HGB 12.0* 12.0* 12.3*  HCT 34.4* 35.4* 36.3*  MCV 88.4 89.6 89.4  PLT 339 360 476   Basic Metabolic Panel:  Recent Labs Lab 07/22/16 1809 07/23/16 0704 07/24/16 0329  NA 137 139 134*  K 4.0 4.0 4.2  CL 102 106 99*  CO2 27 28 27   GLUCOSE 115* 59* 147*  BUN 13 14 9   CREATININE 1.05 0.93 0.99  CALCIUM 9.1 9.0 9.2   GFR: Estimated Creatinine Clearance: 75.1 mL/min (by C-G formula based on SCr of 0.99 mg/dL). Liver Function Tests:  Recent Labs Lab 07/22/16 1809 07/24/16 0329  AST 30 25  ALT 23 23  ALKPHOS 77 71  BILITOT 0.2* 0.5  PROT 6.6 6.6  ALBUMIN  2.5* 2.4*   No results for input(s): LIPASE, AMYLASE in the last 168 hours. No results for input(s): AMMONIA in the last 168 hours. Coagulation Profile: No results for input(s): INR, PROTIME in the last 168 hours. Cardiac Enzymes: No results for input(s): CKTOTAL, CKMB, CKMBINDEX, TROPONINI in the last 168 hours. BNP (last 3 results) No results for input(s): PROBNP in the last 8760 hours. HbA1C: No results for input(s): HGBA1C in the last 72 hours. CBG:  Recent Labs Lab 07/23/16 1938 07/23/16 2114 07/24/16 0741 07/24/16 1158 07/24/16 1606  GLUCAP 250* 216* 217* 203* 124*  Lipid Profile:  Recent Labs  07/24/16 0329  CHOL 162  HDL 37*  LDLCALC 102*  TRIG 115  CHOLHDL 4.4   Thyroid Function Tests: No results for input(s): TSH, T4TOTAL, FREET4, T3FREE, THYROIDAB in the last 72 hours. Anemia Panel: No results for input(s): VITAMINB12, FOLATE, FERRITIN, TIBC, IRON, RETICCTPCT in the last 72 hours. Sepsis Labs: No results for input(s): PROCALCITON, LATICACIDVEN in the last 168 hours.  Recent Results (from the past 240 hour(s))  MRSA PCR Screening     Status: None   Collection Time: 07/22/16  5:42 PM  Result Value Ref Range Status   MRSA by PCR NEGATIVE NEGATIVE Final    Comment:        The GeneXpert MRSA Assay (FDA approved for NASAL specimens only), is one component of a comprehensive MRSA colonization surveillance program. It is not intended to diagnose MRSA infection nor to guide or monitor treatment for MRSA infections.   Culture, blood (routine x 2)     Status: None (Preliminary result)   Collection Time: 07/23/16  4:10 PM  Result Value Ref Range Status   Specimen Description BLOOD RIGHT HAND  Final   Special Requests IN PEDIATRIC BOTTLE Blood Culture adequate volume  Final   Culture NO GROWTH < 24 HOURS  Final   Report Status PENDING  Incomplete  Culture, blood (routine x 2)     Status: None (Preliminary result)   Collection Time: 07/23/16  4:10 PM    Result Value Ref Range Status   Specimen Description BLOOD LEFT HAND  Final   Special Requests IN PEDIATRIC BOTTLE Blood Culture adequate volume  Final   Culture NO GROWTH < 24 HOURS  Final   Report Status PENDING  Incomplete         Radiology Studies: Ct Foot Right W Contrast  Result Date: 07/24/2016 CLINICAL DATA:  Foot infection EXAM: CT OF THE LOWER RIGHT EXTREMITY WITH CONTRAST TECHNIQUE: Multidetector CT imaging of the lower right extremity was performed according to the standard protocol following intravenous contrast administration. COMPARISON:  06/07/2016 radiographs of the right foot CONTRAST:  12mL ISOVUE-300 IOPAMIDOL (ISOVUE-300) INJECTION 61% FINDINGS: Bones/Joint/Cartilage Cortical bone loss and destruction along the plantar aspect of the first distal phalanx. Adjacent soft tissue and intraosseous gas is noted within the first distal phalanx concerning for changes of osteomyelitis with adjacent soft tissue ulceration. No acute fracture of the visualized ankle and foot. The ankle and subtalar joints are maintained as are the midfoot articulations. Mild degenerate changes are noted across the ankle joint with subchondral cystic changes of the talar dome laterally. Ligaments Suboptimally assessed by CT. Muscles and Tendons No muscle atrophy or evidence pyomyositis. Soft tissues Soft tissue swelling and ulceration with soft tissue emphysema along the plantar aspect of the great toe near the tuft. Diffuse vascular calcifications of the ankle and foot consistent with history of diabetes. Soft tissue ulceration overlying the dorsal aspect of the calcaneus. No underlying bone destruction. Calcaneal enthesophytes are seen along the plantar dorsal aspect. Small metallic linear radiopaque foreign body is seen along the plantar and lateral aspect of the hindfoot as on the plain radiographs. IMPRESSION: 1. Soft tissue swelling of the great toe with soft tissue emphysema and ulceration extending to  the distal phalanx. Cortical bone loss (series 8, image 65) is noted along the plantar aspect of the distal phalanx with intraosseous gas consistent with osteomyelitis. 2. Heel ulcer without underlying osteomyelitis of the calcaneus. 3. Thin linear metallic foreign body within the plantar  lateral aspect of the soft tissues of the hindfoot (series 8, image 48). Electronically Signed   By: Ashley Royalty M.D.   On: 07/24/2016 00:19        Scheduled Meds: . ceFEPime (MAXIPIME) IV  2 g Intravenous Q12H  . enoxaparin (LOVENOX) injection  40 mg Subcutaneous Q24H  . folic acid  1 mg Oral Daily  . gabapentin  100 mg Oral TID  . insulin aspart  0-20 Units Subcutaneous TID WC  . insulin aspart  0-5 Units Subcutaneous QHS  . insulin detemir  15 Units Subcutaneous Daily  . multivitamin with minerals  1 tablet Oral Daily  . nicotine  14 mg Transdermal Daily  . oxyCODONE  10 mg Oral Q12H  . thiamine  100 mg Oral Daily  . vancomycin  1,000 mg Intravenous Q12H   Continuous Infusions:   LOS: 2 days   Author:  Berle Mull, MD [facility] Pager: [phone] 07/24/2016 5:58 PM     If 7PM-7AM, please contact night-coverage [email or web address] Password [facility] 07/24/2016, 5:56 PM

## 2016-07-24 NOTE — Consult Note (Signed)
[facility]ist [facility]  Patient name: [name] MRN: [id number] DOB: [birth year] Sex: male   REFERRING PROVIDER:    Family medicine   REASON FOR CONSULT:    Right great toe and heel wound  HISTORY OF PRESENT ILLNESS:   [name] is a 61 y.o. male, who was admitted with with a right great toe ulcer with infection and a right heel ulcer.  This has been present for approximately 1 month.  He has been on abx.  He was in the hospital in January for DKA and SIRS.  He recently has had fevers and chills.  He had type 1 diabetes, which is poorly controlled.  He does have a drug abuse issue.  He is medically managed for hypertension.  He is a smoker.  He is Hep c positive  PAST MEDICAL HISTORY    Past Medical History:  Diagnosis Date  . DEPRESSION   . DIABETES MELLITUS, TYPE I   . DRUG ABUSE    pt should have NO controlled substances rx'ed  . Glaucoma   . HEPATITIS C    chronic  . Hypertension 02/18/2011  . 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 Vitiligo      FAMILY HISTORY   Family History  Problem Relation Age of Onset  . Diabetes Father   . Kidney disease Father   . Arthritis Mother   . Heart disease Mother     CAD    SOCIAL HISTORY:   Social History   Social History  . Marital status: Single    Spouse name: N/A  . Number of children: N/A  . Years of education: N/A   Occupational History  .  Unemployed    disabled   Social History Main Topics  . Smoking status: Current Every Day Smoker    Packs/day: 2.00    Types: Cigarettes  . Smokeless tobacco: Never Used  . Alcohol use 0.0 oz/week     Comment: occasional  . Drug use: Yes    Types: "Crack" cocaine, Marijuana, Heroin     Comment: s/p rehab x 6, occasional drug use  . Sexual activity: Not Currently   Other Topics Concern  . Not on file   Social History Narrative   Lives in boarding house/homeless   Ongoing occ drug use-crack   Single, disabled, prev mental health drug counselor.     ALLERGIES:    Allergies  Allergen Reactions  . Codeine Itching    CURRENT MEDICATIONS:    Current Facility-Administered Medications  Medication Dose Route Frequency Provider Last Rate Last Dose  . acetaminophen (TYLENOL) tablet 650 mg  650 mg Oral Q4H PRN Rhonda G Barrett, PA-C      . ceFEPIme (MAXIPIME) 2 g in dextrose 5 % 50 mL IVPB  2 g Intravenous Q12H Rhonda G Barrett, PA-C   2 g at 07/24/16 1739  . diphenhydrAMINE (BENADRYL) capsule 25 mg  25 mg Oral Q6H PRN Erma Heritage, PA-C   25 mg at 07/22/16 1910  . enoxaparin (LOVENOX) injection 40 mg  40 mg Subcutaneous Q24H Rhonda G Barrett, PA-C   40 mg at 07/23/16 2226  . fentaNYL (SUBLIMAZE) injection 25-50 mcg  25-50 mcg Intravenous Q6H PRN Waldemar Dickens, MD   50 mcg at 07/24/16 0604  . folic acid (FOLVITE) tablet 1 mg  1 mg Oral Daily Rhonda G Barrett, PA-C   1 mg at 07/24/16 1048  . gabapentin (NEURONTIN) capsule 100 mg  100 mg  Oral TID Waldemar Dickens, MD   100 mg at 07/24/16 1742  . gadobenate dimeglumine (MULTIHANCE) injection 15 mL  15 mL Intravenous Once PRN Waldemar Dickens, MD      . hydrALAZINE (APRESOLINE) injection 5-10 mg  5-10 mg Intravenous Q4H PRN Waldemar Dickens, MD      . insulin aspart (novoLOG) injection 0-20 Units  0-20 Units Subcutaneous TID WC Evelene Croon Barrett, PA-C   3 Units at 07/24/16 1740  . insulin aspart (novoLOG) injection 0-5 Units  0-5 Units Subcutaneous QHS Lonn Georgia, PA-C   2 Units at 07/23/16 2228  . insulin detemir (LEVEMIR) injection 15 Units  15 Units Subcutaneous Daily Waldemar Dickens, MD   15 Units at 07/24/16 1106  . ketorolac (TORADOL) 30 MG/ML injection 30 mg  30 mg Intravenous Q8H PRN Waldemar Dickens, MD      . LORazepam (ATIVAN) tablet 1 mg  1 mg Oral Q6H PRN Evelene Croon Barrett, PA-C   1 mg at 07/22/16 1849   Or  . LORazepam (ATIVAN) injection 1 mg  1 mg Intravenous Q6H PRN Evelene Croon Barrett, PA-C   1 mg at 07/23/16 1141    . multivitamin with minerals tablet 1 tablet  1 tablet Oral Daily Evelene Croon Barrett, PA-C   1 tablet at 07/24/16 1048  . nicotine (NICODERM CQ - dosed in mg/24 hours) patch 14 mg  14 mg Transdermal Daily Waldemar Dickens, MD      . nitroGLYCERIN (NITROSTAT) SL tablet 0.4 mg  0.4 mg Sublingual Q5 Min x 3 PRN Rhonda G Barrett, PA-C      . ondansetron (ZOFRAN) injection 4 mg  4 mg Intravenous Q6H PRN Rhonda G Barrett, PA-C      . oxyCODONE (OXYCONTIN) 12 hr tablet 10 mg  10 mg Oral Q12H Waldemar Dickens, MD   10 mg at 07/24/16 1055  . thiamine (VITAMIN B-1) tablet 100 mg  100 mg Oral Daily Rhonda G Barrett, PA-C   100 mg at 07/24/16 1048  . vancomycin (VANCOCIN) IVPB 1000 mg/200 mL premix  1,000 mg Intravenous Q12H Troy Sine, MD   1,000 mg at 07/24/16 1107  . zolpidem (AMBIEN) tablet 5 mg  5 mg Oral QHS PRN,MR X 1 Rhonda G Barrett, PA-C   5 mg at 07/23/16 5681    REVIEW OF SYSTEMS:   [X]  denotes positive finding, [ ]  denotes negative finding Cardiac  Comments:  Chest pain or chest pressure:    Shortness of breath upon exertion:    Short of breath when lying flat:    Irregular heart rhythm:        Vascular    Pain in calf, thigh, or hip brought on by ambulation:    Pain in feet at night that wakes you up from your sleep:     Blood clot in your veins:    Leg swelling:         Pulmonary    Oxygen at home:    Productive cough:     Wheezing:         Neurologic    Sudden weakness in arms or legs:     Sudden numbness in arms or legs:     Sudden onset of difficulty speaking or slurred speech:    Temporary loss of vision in one eye:     Problems with dizziness:         Gastrointestinal    Blood in stool:  Vomited blood:         Genitourinary    Burning when urinating:     Blood in urine:        Psychiatric    Major depression:  x       Hematologic    Bleeding problems:    Problems with blood clotting too easily:        Skin    Rashes or ulcers: x        Constitutional    Fever or chills:     PHYSICAL EXAM:   Vitals:   07/23/16 1128 07/23/16 1940 07/24/16 0346 07/24/16 1200  BP: (!) 175/97 (!) 155/68 (!) 143/57 (!) 148/72  Pulse: 87 85 93 83  Resp: 20 18 16 18   Temp: 97.6 F (36.4 C) 99.3 F (37.4 C) 98.2 F (36.8 C) 98.2 F (36.8 C)  TempSrc: Oral Oral Oral Oral  SpO2: 100% 96% 94% 100%  Weight:   155 lb 11.2 oz (70.6 kg)   Height:        GENERAL: The patient is a well-nourished male, in no acute distress. The vital signs are documented above. CARDIAC: There is a regular rate and rhythm.  VASCULAR: palpable femoral pulses, non palpable pedal pulses PULMONARY: Nonlabored respirations ABDOMEN: Soft and non-tender with normal pitched bowel sounds.  MUSCULOSKELETAL: There are no major deformities or cyanosis. NEUROLOGIC: No focal weakness or paresthesias are detected. SKIN: right great toe ischemia with surrounding erythema.  Necrotic area to right heel PSYCHIATRIC: The patient has a normal affect.  STUDIES:    RIGHT    LEFT    PRESSURE WAVEFORM  PRESSURE WAVEFORM  BRACHIAL 187 Triphasic BRACHIAL 183 Triphasic  DP 115 monophasic DP 255 Biphsic  PT 113 Monophasic PT 255 Biphsic  PER   PER    GREAT TOE NA NA GREAT TOE 0.53 NA    RIGHT LEFT  ABI 0.61 Non compressible vessels    CT FOOT:  1. Soft tissue swelling of the great toe with soft tissue emphysema and ulceration extending to the distal phalanx. Cortical bone loss (series 8, image 65) is noted along the plantar aspect of the distal phalanx with intraosseous gas consistent with osteomyelitis. 2. Heel ulcer without underlying osteomyelitis of the calcaneus. 3. Thin linear metallic foreign body within the plantar lateral aspect of the soft tissues of the hindfoot  ASSESSMENT and PLAN   Diabetic foot wound:  The patient has moderate vascular insufficiency by ABI. He is at risk for limb loss given the tissue loss to the right great toe and heel.   He also has infection extending onto the dorsum of the foot.  I feel he needs angiography to define his anatomy to see what his options for re-vascularization are.  He will at minimum will require a right great toe amputation.  He is scheduled for angiography on Thursday by Dr. Bridgett Larsson.  Further plans to be determined based on angiogram results.The current medical regimen is effective;  continue present plan and medications. Broad spectrum Abx  Check lipid panel and start statin   Annamarie Major, MD [facility] [phone] Pager [phone]

## 2016-07-24 NOTE — Consult Note (Signed)
ORTHOPAEDIC CONSULTATION  REQUESTING PHYSICIAN: Lavina Hamman, MD  Chief Complaint: Painful gangrene right foot.  HPI: [name] is a 61 y.o. male who presents with dry gangrenous changes of the right great toe as well as a ischemic ulcer to the right heel. Patient states that he is diabetic that his sugars are uncontrolled he has had sugars over 1400. He recently has quit smoking. Patient states that he was scheduled to see Dr. Gwenlyn [name] for vascular evaluation to the right lower extremity but has not kept this appointment.  Past Medical History:  Diagnosis Date  . DEPRESSION   . DIABETES MELLITUS, TYPE I   . DRUG ABUSE    pt should have NO controlled substances rx'ed  . Glaucoma   . HEPATITIS C    chronic  . Hypertension 02/18/2011  . 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 Vitiligo    Past Surgical History:  Procedure Laterality Date  . EYE SURGERY     retinal surgery x 2, right eye   Social History   Social History  . Marital status: Single    Spouse name: N/A  . Number of children: N/A  . Years of education: N/A   Occupational History  .  Unemployed    disabled   Social History Main Topics  . Smoking status: Current Every Day Smoker    Packs/day: 2.00    Types: Cigarettes  . Smokeless tobacco: Never Used  . Alcohol use 0.0 oz/week     Comment: occasional  . Drug use: Yes    Types: "Crack" cocaine, Marijuana, Heroin     Comment: s/p rehab x 6, occasional drug use  . Sexual activity: Not Currently   Other Topics Concern  . None   Social History Narrative   Lives in boarding house/homeless   Ongoing occ drug use-crack   Single, disabled, prev mental health drug counselor.    Family History  Problem Relation Age of Onset  . Diabetes Father   . Kidney disease Father   . Arthritis Mother   . Heart disease Mother     CAD   - negative except otherwise stated in the family history section Allergies  Allergen Reactions  . Codeine Itching    Prior to Admission medications   Medication Sig Start Date End Date Taking? Authorizing Provider  insulin NPH Human (NOVOLIN N) 100 UNIT/ML injection Inject 0.25 mLs (25 Units total) into the skin 2 (two) times daily at 8 am and 10 pm. Patient taking differently: Inject 50 Units into the skin daily before breakfast.  06/07/16  Yes Blanchie Dessert, MD   Ct Foot Right W Contrast  Result Date: 07/24/2016 CLINICAL DATA:  Foot infection EXAM: CT OF THE LOWER RIGHT EXTREMITY WITH CONTRAST TECHNIQUE: Multidetector CT imaging of the lower right extremity was performed according to the standard protocol following intravenous contrast administration. COMPARISON:  06/07/2016 radiographs of the right foot CONTRAST:  73mL ISOVUE-300 IOPAMIDOL (ISOVUE-300) INJECTION 61% FINDINGS: Bones/Joint/Cartilage Cortical bone loss and destruction along the plantar aspect of the first distal phalanx. Adjacent soft tissue and intraosseous gas is noted within the first distal phalanx concerning for changes of osteomyelitis with adjacent soft tissue ulceration. No acute fracture of the visualized ankle and foot. The ankle and subtalar joints are maintained as are the midfoot articulations. Mild degenerate changes are noted across the ankle joint with subchondral cystic changes of the talar dome laterally. Ligaments Suboptimally assessed by CT. Muscles and Tendons No muscle atrophy or evidence  pyomyositis. Soft tissues Soft tissue swelling and ulceration with soft tissue emphysema along the plantar aspect of the great toe near the tuft. Diffuse vascular calcifications of the ankle and foot consistent with history of diabetes. Soft tissue ulceration overlying the dorsal aspect of the calcaneus. No underlying bone destruction. Calcaneal enthesophytes are seen along the plantar dorsal aspect. Small metallic linear radiopaque foreign body is seen along the plantar and lateral aspect of the hindfoot as on the plain radiographs. IMPRESSION:  1. Soft tissue swelling of the great toe with soft tissue emphysema and ulceration extending to the distal phalanx. Cortical bone loss (series 8, image 65) is noted along the plantar aspect of the distal phalanx with intraosseous gas consistent with osteomyelitis. 2. Heel ulcer without underlying osteomyelitis of the calcaneus. 3. Thin linear metallic foreign body within the plantar lateral aspect of the soft tissues of the hindfoot (series 8, image 48). Electronically Signed   By: Ashley Royalty M.D.   On: 07/24/2016 00:19   - pertinent xrays, CT, MRI studies were reviewed and independently interpreted  Positive ROS: All other systems have been reviewed and were otherwise negative with the exception of those mentioned in the HPI and as above.  Physical Exam: General: Alert, no acute distress Psychiatric: Patient is competent for consent with normal mood and affect Lymphatic: No axillary or cervical lymphadenopathy Cardiovascular: No pedal edema Respiratory: No cyanosis, no use of accessory musculature GI: No organomegaly, abdomen is soft and non-tender  Skin: Examination patient has gangrenous changes of the right great toe with bony changes consistent with osteomyelitis. Patient does not have a palpable pulse. He has a dry ischemic ulcer involving the heel which is painful to palpation.   Neurologic: Patient does not have protective sensation bilateral lower extremities.   MUSCULOSKELETAL:  Patient's forefoot is red there is no ascending cellulitis. Patient has tenderness to palpation. There are no palpable pulses.  Assessment: Assessment: Diabetic insensate neuropathy with peripheral vascular disease with gangrene involving the right forefoot osteomyelitis of the right great toe and an ischemic ulcer to the right heel.  Plan: Plan: Patient will require a vascular workup to determine if he is a revascularization candidate. Pending the results of the vascular workup I will reevaluate to see  if there are foot salvage options. At this time patient would require a transtibial amputation.  Thank you for the consult and the opportunity to see [name], [name] [phone] 4:03 PM

## 2016-07-25 ENCOUNTER — Encounter (HOSPITAL_COMMUNITY): Payer: Self-pay | Admitting: Vascular Surgery

## 2016-07-25 ENCOUNTER — Encounter (HOSPITAL_COMMUNITY)
Admission: AD | Disposition: A | Discharge status: Home Health | Payer: Self-pay | Source: Ambulatory Visit | Attending: Internal Medicine

## 2016-07-25 DIAGNOSIS — I70261 Atherosclerosis of native arteries of extremities with gangrene, right leg: Secondary | ICD-10-CM

## 2016-07-25 HISTORY — PX: ABDOMINAL AORTOGRAM W/LOWER EXTREMITY: CATH118223

## 2016-07-25 LAB — CBC
HEMATOCRIT: 32.7 % — AB (ref 39.0–52.0)
Hemoglobin: 10.9 g/dL — ABNORMAL LOW (ref 13.0–17.0)
MCH: 29.8 pg (ref 26.0–34.0)
MCHC: 33.3 g/dL (ref 30.0–36.0)
MCV: 89.3 fL (ref 78.0–100.0)
PLATELETS: 389 10*3/uL (ref 150–400)
RBC: 3.66 MIL/uL — ABNORMAL LOW (ref 4.22–5.81)
RDW: 12.4 % (ref 11.5–15.5)
WBC: 13.1 10*3/uL — ABNORMAL HIGH (ref 4.0–10.5)

## 2016-07-25 LAB — LIPID PANEL
CHOL/HDL RATIO: 4.5 ratio
Cholesterol: 153 mg/dL (ref 0–200)
HDL: 34 mg/dL — ABNORMAL LOW (ref >40)
LDL CALC: 100 mg/dL — AB (ref 0–99)
Triglycerides: 97 mg/dL (ref <150)
VLDL: 19 mg/dL (ref 0–40)

## 2016-07-25 LAB — BASIC METABOLIC PANEL
Anion gap: 7 (ref 5–15)
BUN: 11 mg/dL (ref 6–20)
CHLORIDE: 101 mmol/L (ref 101–111)
CO2: 27 mmol/L (ref 22–32)
Calcium: 8.9 mg/dL (ref 8.9–10.3)
Creatinine, Ser: 0.98 mg/dL (ref 0.61–1.24)
GFR calc Af Amer: >60 mL/min (ref >60)
GFR calc non Af Amer: >60 mL/min (ref >60)
GLUCOSE: 86 mg/dL (ref 65–99)
POTASSIUM: 4 mmol/L (ref 3.5–5.1)
SODIUM: 135 mmol/L (ref 135–145)

## 2016-07-25 LAB — RAPID URINE DRUG SCREEN, HOSP PERFORMED
Amphetamines: NOT DETECTED
Barbiturates: NOT DETECTED
Benzodiazepines: POSITIVE — AB
COCAINE: POSITIVE — AB
OPIATES: NOT DETECTED
TETRAHYDROCANNABINOL: POSITIVE — AB

## 2016-07-25 LAB — GLUCOSE, CAPILLARY
GLUCOSE-CAPILLARY: 121 mg/dL — AB (ref 65–99)
GLUCOSE-CAPILLARY: 194 mg/dL — AB (ref 65–99)
GLUCOSE-CAPILLARY: 335 mg/dL — AB (ref 65–99)
Glucose-Capillary: 101 mg/dL — ABNORMAL HIGH (ref 65–99)

## 2016-07-25 LAB — HEPATITIS PANEL, ACUTE
HCV Ab: >11 s/co ratio — ABNORMAL HIGH (ref 0.0–0.9)
HEP A IGM: NEGATIVE
Hep B C IgM: NEGATIVE
Hepatitis B Surface Ag: NEGATIVE

## 2016-07-25 LAB — PROTIME-INR
INR: 1
Prothrombin Time: 13.2 seconds (ref 11.4–15.2)

## 2016-07-25 SURGERY — ABDOMINAL AORTOGRAM W/LOWER EXTREMITY
Anesthesia: LOCAL

## 2016-07-25 MED ORDER — MIDAZOLAM HCL 2 MG/2ML IJ SOLN
INTRAMUSCULAR | Status: DC | PRN
  Administered 2016-07-25: 1 mg via INTRAVENOUS

## 2016-07-25 MED ORDER — GABAPENTIN 100 MG PO CAPS
200.0000 mg | ORAL_CAPSULE | Freq: Three times a day (TID) | ORAL | Status: DC
  Administered 2016-07-25 – 2016-07-28 (×9): 200 mg via ORAL
  Filled 2016-07-25 (×9): qty 2

## 2016-07-25 MED ORDER — FENTANYL CITRATE (PF) 100 MCG/2ML IJ SOLN
INTRAMUSCULAR | Status: AC
  Filled 2016-07-25: qty 2

## 2016-07-25 MED ORDER — METHOCARBAMOL 500 MG PO TABS
500.0000 mg | ORAL_TABLET | Freq: Three times a day (TID) | ORAL | Status: DC
  Administered 2016-07-25 – 2016-07-30 (×15): 500 mg via ORAL
  Filled 2016-07-25 (×15): qty 1

## 2016-07-25 MED ORDER — SODIUM CHLORIDE 0.9 % IV SOLN
INTRAVENOUS | Status: DC | PRN
  Administered 2016-07-25: 100 mL/h via INTRAVENOUS

## 2016-07-25 MED ORDER — LIDOCAINE HCL (PF) 1 % IJ SOLN
INTRAMUSCULAR | Status: DC | PRN
  Administered 2016-07-25: 15 mL via SUBCUTANEOUS

## 2016-07-25 MED ORDER — HYDROCODONE-ACETAMINOPHEN 5-325 MG PO TABS
1.0000 | ORAL_TABLET | Freq: Four times a day (QID) | ORAL | Status: DC | PRN
  Administered 2016-07-25 – 2016-07-30 (×17): 1 via ORAL
  Filled 2016-07-25 (×17): qty 1

## 2016-07-25 MED ORDER — FENTANYL CITRATE (PF) 100 MCG/2ML IJ SOLN
INTRAMUSCULAR | Status: DC | PRN
  Administered 2016-07-25: 50 ug via INTRAVENOUS

## 2016-07-25 MED ORDER — HEPARIN (PORCINE) IN NACL 2-0.9 UNIT/ML-% IJ SOLN
INTRAMUSCULAR | Status: DC | PRN
  Administered 2016-07-25: 1000 mL

## 2016-07-25 MED ORDER — LIDOCAINE HCL (PF) 1 % IJ SOLN
INTRAMUSCULAR | Status: AC
Start: 2016-07-25 — End: 2016-07-25
  Filled 2016-07-25: qty 30

## 2016-07-25 MED ORDER — LIDOCAINE HCL (PF) 1 % IJ SOLN
INTRAMUSCULAR | Status: AC
  Filled 2016-07-25: qty 30

## 2016-07-25 MED ORDER — MIDAZOLAM HCL 2 MG/2ML IJ SOLN
INTRAMUSCULAR | Status: AC
  Filled 2016-07-25: qty 2

## 2016-07-25 MED ORDER — IODIXANOL 320 MG/ML IV SOLN
INTRAVENOUS | Status: DC | PRN
  Administered 2016-07-25: 108 mL via INTRA_ARTERIAL

## 2016-07-25 SURGICAL SUPPLY — 10 items
CATH OMNI FLUSH 5F 65CM (CATHETERS) ×1 IMPLANT
COVER PRB 48X5XTLSCP FOLD TPE (BAG) IMPLANT
COVER PROBE 5X48 (BAG) ×2
KIT MICROINTRODUCER STIFF 5F (SHEATH) ×1 IMPLANT
KIT PV (KITS) ×2 IMPLANT
SHEATH PINNACLE 5F 10CM (SHEATH) ×1 IMPLANT
SYR MEDRAD MARK V 150ML (SYRINGE) ×2 IMPLANT
TRANSDUCER W/STOPCOCK (MISCELLANEOUS) ×2 IMPLANT
TRAY PV CATH (CUSTOM PROCEDURE TRAY) ×2 IMPLANT
WIRE BENTSON .035X145CM (WIRE) ×1 IMPLANT

## 2016-07-25 NOTE — Progress Notes (Signed)
Pt suspected of smoking in pt's bathroom, as the odor was present, and he dropped a red lighter on the floor in front of me and April, NT.  Pt, at first, denied smoking.  However, once security was called, he admitted to smoking in bathroom.  Lighter and 1 cigarette was found.  Lighter was placed in plastic bag with pt's ID label on and given to [facility], Mudlogger.   Later, Tiara, NT, found a vape on the floor in pt's room.  It too was secured in a plastic bag with pt's name on it, and given to [place], PM charge nurse.

## 2016-07-25 NOTE — Progress Notes (Signed)
Per pharmacist, ok to hang vancomycin gtts prior to vanc trough being drawn.  Will try to get vanc trough drawn tomorrow, July 26, 2016.

## 2016-07-25 NOTE — Evaluation (Signed)
Physical Therapy Evaluation Patient Details Name: [name] MRN: [id number] DOB: 12-20-1955 Today's Date: 07/25/2016   History of Present Illness  61 y.o.malewith a history of DM1 (uncontrolled), hep C, HTN, depression, drug abuse, tob use. Pt with diabetic foot infection - R great toe.  Pt with older heel wound from Jan 18  Clinical Impression  Pt seen for initial evaluation, session duration limited by arrival of transport to take pt for test. At this time the pt is able to ambulate using rw, 12 ft X2. Pt demonstrating generally good stability but PT providing min guard assistance for safety. Pt states that he is unsure of where he will go following his hospital stay. States he has been staying with his son and daughter-in-law but not sure if he wants to go back there. PT will continue to follow and progress mobility as tolerated.     Follow Up Recommendations Home health PT;Supervision for mobility/OOB    Equipment Recommendations  Rolling walker with 5" wheels    Recommendations for Other Services       Precautions / Restrictions Precautions Precautions: Fall Restrictions Weight Bearing Restrictions: No      Mobility  Bed Mobility Overal bed mobility: Independent Bed Mobility: Supine to Sit;Sit to Supine     Supine to sit: Independent Sit to supine: Independent      Transfers Overall transfer level: Needs assistance Equipment used: Rolling walker (2 wheeled) Transfers: Sit to/from Stand Sit to Stand: Min guard         General transfer comment: performed from bed and toilet. Guard for safety  Ambulation/Gait Ambulation/Gait assistance: Min guard Ambulation Distance (Feet):  (12 ft X2) Assistive device: Rolling walker (2 wheeled) Gait Pattern/deviations:  (swing-to pattern) Gait velocity: decreased   General Gait Details: pt avoiding weightbearing through Rt LE  Stairs            Wheelchair Mobility    Modified Rankin (Stroke Patients Only)        Balance Overall balance assessment: Needs assistance Sitting-balance support: No upper extremity supported Sitting balance-Leahy Scale: Good     Standing balance support: Single extremity supported Standing balance-Leahy Scale: Fair Standing balance comment: using rw for ambulation                             Pertinent Vitals/Pain Pain Assessment: 0-10 Pain Score: 7  Pain Location: R foot Pain Descriptors / Indicators:  (hurts) Pain Intervention(s): Limited activity within patient's tolerance;Monitored during session    Home Living Family/patient expects to be discharged to:: Unsure Living Arrangements: Children   Type of Home: Mobile home Home Access: Stairs to enter Entrance Stairs-Rails: Psychiatric nurse of Steps: 4 Home Layout: One level Home Equipment: None Additional Comments: lives with son and daughter-in-law    Prior Function Level of Independence: Needs assistance;Independent with assistive device(s)   Gait / Transfers Assistance Needed: independent prior to toe pain, borrowing walker for home PTA           Hand Dominance        Extremity/Trunk Assessment   Upper Extremity Assessment Upper Extremity Assessment: Overall WFL for tasks assessed    Lower Extremity Assessment Lower Extremity Assessment: RLE deficits/detail;LLE deficits/detail RLE Deficits / Details: able to move and lift LE, minimizing active use of foot throughout session due to pain LLE Deficits / Details: WFL       Communication   Communication: No difficulties  Cognition Arousal/Alertness: Awake/alert  Behavior During Therapy: Anxious Overall Cognitive Status: Within Functional Limits for tasks assessed                                 General Comments: pt anxious regarding possibility of amputation      General Comments      Exercises     Assessment/Plan    PT Assessment Patient needs continued PT services  PT Problem  List Decreased strength;Decreased activity tolerance;Decreased balance;Decreased mobility       PT Treatment Interventions DME instruction;Gait training;Stair training;Functional mobility training;Therapeutic activities;Therapeutic exercise;Patient/family education    PT Goals (Current goals can be found in the Care Plan section)  Acute Rehab PT Goals Patient Stated Goal: figure out what's going to happen PT Goal Formulation: With patient Time For Goal Achievement: 08/08/16 Potential to Achieve Goals: Good    Frequency Min 3X/week   Barriers to discharge        Co-evaluation               End of Session   Activity Tolerance: Patient tolerated treatment well Patient left: in bed (with transport after session to go for test) Nurse Communication: Mobility status PT Visit Diagnosis: Unsteadiness on feet (R26.81)    Time: 1103-1594 PT Time Calculation (min) (ACUTE ONLY): 20 min   Charges:   PT Evaluation $PT Eval Moderate Complexity: 1 Procedure     PT G Codes:        Cassell Clement, PT, CSCS Pager [phone] Office [phone]   Linard Millers 07/25/2016, 9:41 AM

## 2016-07-25 NOTE — H&P (View-Only) (Signed)
[facility]  Patient name: [name] MRN: [id number] DOB: [birth year] Sex: male   REFERRING PROVIDER:    Family medicine   REASON FOR CONSULT:    Right great toe and heel wound  HISTORY OF PRESENT ILLNESS:   [name] is a 61 y.o. male, who was admitted with with a right great toe ulcer with infection and a right heel ulcer.  This has been present for approximately 1 month.  He has been on abx.  He was in the hospital in January for DKA and SIRS.  He recently has had fevers and chills.  He had type 1 diabetes, which is poorly controlled.  He does have a drug abuse issue.  He is medically managed for hypertension.  He is a smoker.  He is Hep c positive  PAST MEDICAL HISTORY    Past Medical History:  Diagnosis Date  . DEPRESSION   . DIABETES MELLITUS, TYPE I   . DRUG ABUSE    pt should have NO controlled substances rx'ed  . Glaucoma   . HEPATITIS C    chronic  . Hypertension 02/18/2011  . 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 Vitiligo      FAMILY HISTORY   Family History  Problem Relation Age of Onset  . Diabetes Father   . Kidney disease Father   . Arthritis Mother   . Heart disease Mother     CAD    SOCIAL HISTORY:   Social History   Social History  . Marital status: Single    Spouse name: N/A  . Number of children: N/A  . Years of education: N/A   Occupational History  .  Unemployed    disabled   Social History Main Topics  . Smoking status: Current Every Day Smoker    Packs/day: 2.00    Types: Cigarettes  . Smokeless tobacco: Never Used  . Alcohol use 0.0 oz/week     Comment: occasional  . Drug use: Yes    Types: "Crack" cocaine, Marijuana, Heroin     Comment: s/p rehab x 6, occasional drug use  . Sexual activity: Not Currently   Other Topics Concern  . Not on file   Social History Narrative   Lives in boarding house/homeless   Ongoing occ drug use-crack   Single, disabled, prev mental health drug counselor.     ALLERGIES:    Allergies  Allergen Reactions  . Codeine Itching    CURRENT MEDICATIONS:    Current Facility-Administered Medications  Medication Dose Route Frequency Provider Last Rate Last Dose  . acetaminophen (TYLENOL) tablet 650 mg  650 mg Oral Q4H PRN Rhonda G Barrett, PA-C      . ceFEPIme (MAXIPIME) 2 g in dextrose 5 % 50 mL IVPB  2 g Intravenous Q12H Rhonda G Barrett, PA-C   2 g at 07/24/16 1739  . diphenhydrAMINE (BENADRYL) capsule 25 mg  25 mg Oral Q6H PRN Erma Heritage, PA-C   25 mg at 07/22/16 1910  . enoxaparin (LOVENOX) injection 40 mg  40 mg Subcutaneous Q24H Rhonda G Barrett, PA-C   40 mg at 07/23/16 2226  . fentaNYL (SUBLIMAZE) injection 25-50 mcg  25-50 mcg Intravenous Q6H PRN Waldemar Dickens, MD   50 mcg at 07/24/16 0604  . folic acid (FOLVITE) tablet 1 mg  1 mg Oral Daily Rhonda G Barrett, PA-C   1 mg at 07/24/16 1048  . gabapentin (NEURONTIN) capsule 100 mg  100 mg  Oral TID Waldemar Dickens, MD   100 mg at 07/24/16 1742  . gadobenate dimeglumine (MULTIHANCE) injection 15 mL  15 mL Intravenous Once PRN Waldemar Dickens, MD      . hydrALAZINE (APRESOLINE) injection 5-10 mg  5-10 mg Intravenous Q4H PRN Waldemar Dickens, MD      . insulin aspart (novoLOG) injection 0-20 Units  0-20 Units Subcutaneous TID WC Evelene Croon Barrett, PA-C   3 Units at 07/24/16 1740  . insulin aspart (novoLOG) injection 0-5 Units  0-5 Units Subcutaneous QHS Lonn Georgia, PA-C   2 Units at 07/23/16 2228  . insulin detemir (LEVEMIR) injection 15 Units  15 Units Subcutaneous Daily Waldemar Dickens, MD   15 Units at 07/24/16 1106  . ketorolac (TORADOL) 30 MG/ML injection 30 mg  30 mg Intravenous Q8H PRN Waldemar Dickens, MD      . LORazepam (ATIVAN) tablet 1 mg  1 mg Oral Q6H PRN Evelene Croon Barrett, PA-C   1 mg at 07/22/16 1849   Or  . LORazepam (ATIVAN) injection 1 mg  1 mg Intravenous Q6H PRN Evelene Croon Barrett, PA-C   1 mg at 07/23/16 1141    . multivitamin with minerals tablet 1 tablet  1 tablet Oral Daily Evelene Croon Barrett, PA-C   1 tablet at 07/24/16 1048  . nicotine (NICODERM CQ - dosed in mg/24 hours) patch 14 mg  14 mg Transdermal Daily Waldemar Dickens, MD      . nitroGLYCERIN (NITROSTAT) SL tablet 0.4 mg  0.4 mg Sublingual Q5 Min x 3 PRN Rhonda G Barrett, PA-C      . ondansetron (ZOFRAN) injection 4 mg  4 mg Intravenous Q6H PRN Rhonda G Barrett, PA-C      . oxyCODONE (OXYCONTIN) 12 hr tablet 10 mg  10 mg Oral Q12H Waldemar Dickens, MD   10 mg at 07/24/16 1055  . thiamine (VITAMIN B-1) tablet 100 mg  100 mg Oral Daily Rhonda G Barrett, PA-C   100 mg at 07/24/16 1048  . vancomycin (VANCOCIN) IVPB 1000 mg/200 mL premix  1,000 mg Intravenous Q12H Troy Sine, MD   1,000 mg at 07/24/16 1107  . zolpidem (AMBIEN) tablet 5 mg  5 mg Oral QHS PRN,MR X 1 Rhonda G Barrett, PA-C   5 mg at 07/23/16 0354    REVIEW OF SYSTEMS:   [X]  denotes positive finding, [ ]  denotes negative finding Cardiac  Comments:  Chest pain or chest pressure:    Shortness of breath upon exertion:    Short of breath when lying flat:    Irregular heart rhythm:        Vascular    Pain in calf, thigh, or hip brought on by ambulation:    Pain in feet at night that wakes you up from your sleep:     Blood clot in your veins:    Leg swelling:         Pulmonary    Oxygen at home:    Productive cough:     Wheezing:         Neurologic    Sudden weakness in arms or legs:     Sudden numbness in arms or legs:     Sudden onset of difficulty speaking or slurred speech:    Temporary loss of vision in one eye:     Problems with dizziness:         Gastrointestinal    Blood in stool:  Vomited blood:         Genitourinary    Burning when urinating:     Blood in urine:        Psychiatric    Major depression:  x       Hematologic    Bleeding problems:    Problems with blood clotting too easily:        Skin    Rashes or ulcers: x        Constitutional    Fever or chills:     PHYSICAL EXAM:   Vitals:   07/23/16 1128 07/23/16 1940 07/24/16 0346 07/24/16 1200  BP: (!) 175/97 (!) 155/68 (!) 143/57 (!) 148/72  Pulse: 87 85 93 83  Resp: 20 18 16 18   Temp: 97.6 F (36.4 C) 99.3 F (37.4 C) 98.2 F (36.8 C) 98.2 F (36.8 C)  TempSrc: Oral Oral Oral Oral  SpO2: 100% 96% 94% 100%  Weight:   155 lb 11.2 oz (70.6 kg)   Height:        GENERAL: The patient is a well-nourished male, in no acute distress. The vital signs are documented above. CARDIAC: There is a regular rate and rhythm.  VASCULAR: palpable femoral pulses, non palpable pedal pulses PULMONARY: Nonlabored respirations ABDOMEN: Soft and non-tender with normal pitched bowel sounds.  MUSCULOSKELETAL: There are no major deformities or cyanosis. NEUROLOGIC: No focal weakness or paresthesias are detected. SKIN: right great toe ischemia with surrounding erythema.  Necrotic area to right heel PSYCHIATRIC: The patient has a normal affect.  STUDIES:    RIGHT    LEFT    PRESSURE WAVEFORM  PRESSURE WAVEFORM  BRACHIAL 187 Triphasic BRACHIAL 183 Triphasic  DP 115 monophasic DP 255 Biphsic  PT 113 Monophasic PT 255 Biphsic  PER   PER    GREAT TOE NA NA GREAT TOE 0.53 NA    RIGHT LEFT  ABI 0.61 Non compressible vessels    CT FOOT:  1. Soft tissue swelling of the great toe with soft tissue emphysema and ulceration extending to the distal phalanx. Cortical bone loss (series 8, image 65) is noted along the plantar aspect of the distal phalanx with intraosseous gas consistent with osteomyelitis. 2. Heel ulcer without underlying osteomyelitis of the calcaneus. 3. Thin linear metallic foreign body within the plantar lateral aspect of the soft tissues of the hindfoot  ASSESSMENT and PLAN   Diabetic foot wound:  The patient has moderate vascular insufficiency by ABI. He is at risk for limb loss given the tissue loss to the right great toe and heel.   He also has infection extending onto the dorsum of the foot.  I feel he needs angiography to define his anatomy to see what his options for re-vascularization are.  He will at minimum will require a right great toe amputation.  He is scheduled for angiography on Thursday by Dr. Bridgett Larsson.  Further plans to be determined based on angiogram results.The current medical regimen is effective;  continue present plan and medications. Broad spectrum Abx  Check lipid panel and start statin   Annamarie Major, MD [facility] [phone] Pager [phone]

## 2016-07-25 NOTE — Op Note (Signed)
OPERATIVE NOTE   PROCEDURE: 1.  Left common femoral artery cannulation under ultrasound guidance 2.  Placement of catheter in aorta 3.  Aortogram 4.  Conscious sedation for 29 minutes 5.  Second order arterial selection 6.  Right leg runoff via catheter 7.  Left leg runoff via sheath  PRE-OPERATIVE DIAGNOSIS: right foot gangrene  POST-OPERATIVE DIAGNOSIS: same as above   SURGEON: Adele Barthel, MD  ANESTHESIA: conscious sedation  ESTIMATED BLOOD LOSS: 50 cc  CONTRAST: 98 cc  FINDING(S):  Diffuse calcification evident  Aorta: patent  Superior mesenteric artery: patent Celiac artery: patent   Right Left  RA patent patent  CIA patent patent  EIA patent patent  IIA patent patent  CFA patent patent  SFA Patent, calcified throughout, >75% stenosis at Hunter's canal Patent, calcified throughout, >75% stenosis at Hunter's canal  PFA patent patent  Pop Occluded, hypertrophied medial geniculate collateral evident Occluded, geniculate collaterals evident  Trif Reconstituted disease diseased tibioperoneal trunk  Occluded  AT Reconstituted from collaterals with sub-total occlusion in proximal and mid-segment Reconstituted from geniculate  Pero Diseased throughout with miniscule lumen distally Reconstituted miniscule lumen from collaterals  PT Reconstituted miniscule lumen from medial geniculate collateral flow Reconstituted miniscule lumen from collaterals  Foot No flow to toes and minimal flow to heel noted Not imaged   SPECIMEN(S):  none  INDICATIONS:   [name] is a 61 y.o. male who presents with right great toe and ulcer gangrene.  The patient presents for: aortogram, bilateral leg runoff, and possible intervention.  I discussed with the patient the nature of angiographic procedures, especially the limited patencies of any endovascular intervention.  The patient is aware of that the risks of an angiographic procedure include but are not limited to: bleeding, infection,  access site complications, renal failure, embolization, rupture of vessel, dissection, possible need for emergent surgical intervention, possible need for surgical procedures to treat the patient's pathology, and stroke and death.  The patient is aware of the risks and agrees to proceed.  DESCRIPTION: After full informed consent was obtained from the patient, the patient was brought back to the angiography suite.  The patient was placed supine upon the angiography table and connected to cardiopulmonary monitoring equipment.  The patient was then given conscious sedation, the amounts of which are documented in the patient's chart.  A circulating radiologic technician maintained continuous monitoring of the patient's cardiopulmonary status.  Additionally, the control room radiologic technician provided backup monitoring throughout the procedure.  The patient was prepped and drape in the standard fashion for an angiographic procedure.  At this point, attention was turned to the left groin.  Under ultrasound guidance, the subcutaneous tissue surrounding the left common femoral artery was anesthesized with 1% lidocaine with epinephrine.  The artery was then cannulated with a micropuncture needle.  The microwire was advanced into the iliac arterial system.  The needle was exchanged for a microsheath, which was loaded into the common femoral artery over the wire.  The microwire was exchanged for a Bentson wire which was advanced into the aorta.  The microsheath was then exchanged for a 5-Fr sheath which was loaded into the common femoral artery.  The Omniflush catheter was then loaded over the wire up to the level of L1.  The catheter was connected to the power injector circuit.  After de-airring and de-clotting the circuit, a power injector aortogram was completed.    Using a Bentson wire and Omniflush catheter, the right common iliac artery was  selected.  The catheter and wire were advanced into the external  iliac artery.  The right leg runoff was completed in stations due patient's delirious behavior.  Based on the images, I suspect the right anterior tibial artery can be recannulated, but I doubt the posterior tibial artery can recannulated.  Additionally, I see no flow to the fore foot and minimal flow the heel, which would limit any value to recannulating the right anterior tibial artery.  I replaced the wire into the catheter, straightening out the crook in the catheter.  Both were removed from the sheath together.  The left femoral sheath was aspirated and then connected to the power injector circuit.  The left leg runoff was imaged in stations due to the patient's limited ability to follow instructions.  The findings are listed above.    The sheath was aspirated.  No clots were present and the sheath was reloaded with heparinized saline.     COMPLICATIONS: none  CONDITION: stable   Adele Barthel, MD, [facility] [place] Office: [phone] Pager: [phone]  07/25/2016, 10:31 AM

## 2016-07-25 NOTE — Progress Notes (Signed)
Patient is refusing morning weight. States he does not want to get up. Nurse and tech attempted x2.

## 2016-07-25 NOTE — Interval H&P Note (Signed)
   History and Physical Update  The patient was interviewed and re-examined.  The patient's previous History and Physical has been reviewed and is unchanged from Dr. Stephens Shire consult.  There is no change in the plan of care: aortogram, bilateral leg runoff, and possible right leg intervention.   I discussed with the patient the nature of angiographic procedures, especially the limited patencies of any endovascular intervention.    The patient is aware of that the risks of an angiographic procedure include but are not limited to: bleeding, infection, access site complications, renal failure, embolization, rupture of vessel, dissection, arteriovenous fistula, possible need for emergent surgical intervention, possible need for surgical procedures to treat the patient's pathology, anaphylactic reaction to contrast, and stroke and death.    The patient is aware of the risks and agrees to proceed.   Laboratory: CBC:    Component Value Date/Time   WBC 13.1 (H) 07/25/2016 0240   RBC 3.66 (L) 07/25/2016 0240   HGB 10.9 (L) 07/25/2016 0240   HCT 32.7 (L) 07/25/2016 0240   PLT 389 07/25/2016 0240   MCV 89.3 07/25/2016 0240   MCH 29.8 07/25/2016 0240   MCHC 33.3 07/25/2016 0240   RDW 12.4 07/25/2016 0240   LYMPHSABS 2.5 07/22/2016 1809   MONOABS 0.9 07/22/2016 1809   EOSABS 0.2 07/22/2016 1809   BASOSABS 0.0 07/22/2016 1809    BMP:    Component Value Date/Time   NA 135 07/25/2016 0240   K 4.0 07/25/2016 0240   CL 101 07/25/2016 0240   CO2 27 07/25/2016 0240   GLUCOSE 86 07/25/2016 0240   BUN 11 07/25/2016 0240   CREATININE 0.98 07/25/2016 0240   CALCIUM 8.9 07/25/2016 0240   GFRNONAA >60 07/25/2016 0240   GFRAA >60 07/25/2016 0240    Coagulation: Lab Results  Component Value Date   INR 1.00 07/25/2016   No results found for: PTT  Lipids:    Component Value Date/Time   CHOL 162 07/24/2016 0329   TRIG 115 07/24/2016 0329   HDL 37 (L) 07/24/2016 0329   CHOLHDL 4.4  07/24/2016 0329   VLDL 23 07/24/2016 0329   LDLCALC 102 (H) 07/24/2016 0329    .  Adele Barthel, MD, FACS [facility] [place] Office: [phone] Pager: [phone]  07/25/2016, 9:31 AM

## 2016-07-25 NOTE — Progress Notes (Signed)
PROGRESS NOTE    [name]  OZD:[id number] DOB: 01/13/1956 DOA: 07/22/2016 PCP: Renato Shin, MD   Brief Narrative:  Pt admitted on 07/22/16 directly from Dr. Evette Georges office. Accepted pt to [facility] service on 07/23/16 after discussing pt condition w/ Dr. Radford Pax.    As noted from his H&P [name] is a 61 y.o. male with a history of DM1 (uncontrolled), hep C, HTN, depression, drug abuse, tob use. He smoked crack last 3 days ago.  He has been having problems with his R great toe for over a month. It started after he was hospitalized with DKA and SIRS in January 2018. He had a R heel ulcer in January and was on antibiotics. He saw Dr Loanne Drilling 02/05 and was referred to a a wound specialist for the heel wound. He then developed the right great toe wound. The right heel wound is blackened but not draining and not eating larger. The right great toe wound has been worsening steadily. It has grown more swollen, or reddened, started blackening and his foot has become swollen.  In the last 3 or 4 days, he has developed chills and subjective fevers. He has general malaise and generally feels very bad. He is unable to walk on the foot. He has a supportive shoe and a walker that he uses to get around.  He is not having any cardiac symptoms. Specifically, he is not having any chest pain, no new dyspnea on exertion and shortness of breath. He denies orthopnea or PND. He is not having palpitations. He is having weakness and some dizziness. He has not been checking his blood sugars.  He has been smoking marijuana which helps with the pain.  Assessment & Plan:  R foot gangrene and cellulitis:  Pulses limited on exam. h/o PAD  WBC improving - Vanc 07/22/16>>> 07/25/2016 - Cefepime 07/22/16 >>> for blurred 2018. Discuss with infectious disease, currently we will hold antibiotics, requesting orthopedic to attempt cultures while doing amputation or debridement.  - Cannot get an MRI due to foreign body, received  CT scan with contrast which is positive for osteomyelitis. Orthopedics as well as vascular surgery consulted. - ABI positive, angiogram shows blockages and SFA as well as from popliteal artery. Bilaterally. - BCX (ABX started 24hrs prior to obtaining unfortunately) no growth -Given history of drug abuse patient is at high risk for substance abuse with medication. Reviewed [facility] controlled substance access, no high-risk prescriptions in last 6 months. At present I would discontinue the scheduled OxyContin, use Norco 6 hours as needed, placing the patient on scheduled Robaxin and increasing the dose of gabapentin. Continue Toradol.  - PT/OT/Wound  Diabetes: last A1c 8.8 - Continue SSI - Adding Levemir 15 units daily as pt has had this before w/o difficulty at <25 units.  PAD: previous studies in 2015 showing stenotic and occlusive disease bilaterally in LE.  - Workup as above - Lipid panel  Peripheral neuropathy: self treating w/ marijuana - Start Neurontin increased to 200 3 times a day - mgt of PAD/DM/HTN  HTN: Intermittent and no h/o the same. May be from pain. - Hydralazine PRN - conisder initiating long term therapy prior to DC if remains elevated.   Chronic Hepatitis: unsure of last appt.  We will monitor and check viral titers.  Tobacco/cocaine abuse: 40+pack year smoker. Endorses recent THC and cocaine use.  - NIcotine patch - UDS positive for cocaine, benzodiazepine, cannabis.   DVT prophylaxis: Lovenox Code Status: Full Family Communication: None  Disposition Plan: pending studies and eval by vascular/ortho   Consultants:   Vascular, infectious disease and Ortho consulted  Procedures:   None  Antimicrobials: (specify start and planned stop date. Auto populated tables are space occupying and do not give end dates)  Stop today   Subjective: Complains about severe pain while appearing in no distress, no nausea and vomiting.  Objective: Vitals:    07/25/16 1039 07/25/16 1043 07/25/16 1048 07/25/16 1100  BP: (!) 145/78 (!) 153/79  (!) 150/66  Pulse: 82 79 (!) 0   Resp: 13 16 (!) 96 20  Temp:    98.7 F (37.1 C)  TempSrc:    Oral  SpO2: 94% 95% (!) 0% 99%  Weight:      Height:        Intake/Output Summary (Last 24 hours) at 07/25/16 1655 Last data filed at 07/25/16 1330  Gross per 24 hour  Intake              655 ml  Output              475 ml  Net              180 ml   Filed Weights   07/22/16 2100 07/23/16 0442 07/24/16 0346  Weight: 71.5 kg (157 lb 11.2 oz) 70.8 kg (156 lb 1.6 oz) 70.6 kg (155 lb 11.2 oz)    Examination:  General exam: Appears calm and comfortable  Respiratory system: Clear to auscultation. Respiratory effort normal. Cardiovascular system: S1 & S2 heard, RRR. No JVD, murmurs, rubs, gallops or clicks. No pedal edema. Gastrointestinal system: Abdomen is nondistended, soft and nontender. No organomegaly or masses felt. Normal bowel sounds heard. Central nervous system: Alert and oriented. No focal neurological deficits. Extremities: Symmetric 5 x 5 power. Skin: Right foot edematous and erythema from toes to ankle with dry gangrene of the right great toe tip and right heel. Psychiatry: Judgement and insight appear normal. Mood & affect appropriate.     Data Reviewed: I have personally reviewed following labs and imaging studies  CBC:  Recent Labs Lab 07/22/16 1809 07/23/16 0704 07/24/16 0329 07/25/16 0240  WBC 17.5* 15.5* 16.0* 13.1*  NEUTROABS 13.9*  --   --   --   HGB 12.0* 12.0* 12.3* 10.9*  HCT 34.4* 35.4* 36.3* 32.7*  MCV 88.4 89.6 89.4 89.3  PLT 339 360 397 081   Basic Metabolic Panel:  Recent Labs Lab 07/22/16 1809 07/23/16 0704 07/24/16 0329 07/25/16 0240  NA 137 139 134* 135  K 4.0 4.0 4.2 4.0  CL 102 106 99* 101  CO2 27 28 27 27   GLUCOSE 115* 59* 147* 86  BUN 13 14 9 11   CREATININE 1.05 0.93 0.99 0.98  CALCIUM 9.1 9.0 9.2 8.9   GFR: Estimated Creatinine Clearance:  75.9 mL/min (by C-G formula based on SCr of 0.98 mg/dL). Liver Function Tests:  Recent Labs Lab 07/22/16 1809 07/24/16 0329  AST 30 25  ALT 23 23  ALKPHOS 77 71  BILITOT 0.2* 0.5  PROT 6.6 6.6  ALBUMIN 2.5* 2.4*   No results for input(s): LIPASE, AMYLASE in the last 168 hours. No results for input(s): AMMONIA in the last 168 hours. Coagulation Profile:  Recent Labs Lab 07/25/16 0240  INR 1.00   Cardiac Enzymes: No results for input(s): CKTOTAL, CKMB, CKMBINDEX, TROPONINI in the last 168 hours. BNP (last 3 results) No results for input(s): PROBNP in the last 8760 hours. HbA1C: No results for input(s):  HGBA1C in the last 72 hours. CBG:  Recent Labs Lab 07/24/16 1606 07/24/16 2136 07/25/16 0822 07/25/16 1233 07/25/16 1602  GLUCAP 124* 108* 121* 194* 335*   Lipid Profile:  Recent Labs  07/24/16 0329 07/25/16 1107  CHOL 162 153  HDL 37* 34*  LDLCALC 102* 100*  TRIG 115 97  CHOLHDL 4.4 4.5   Thyroid Function Tests: No results for input(s): TSH, T4TOTAL, FREET4, T3FREE, THYROIDAB in the last 72 hours. Anemia Panel: No results for input(s): VITAMINB12, FOLATE, FERRITIN, TIBC, IRON, RETICCTPCT in the last 72 hours. Sepsis Labs: No results for input(s): PROCALCITON, LATICACIDVEN in the last 168 hours.  Recent Results (from the past 240 hour(s))  MRSA PCR Screening     Status: None   Collection Time: 07/22/16  5:42 PM  Result Value Ref Range Status   MRSA by PCR NEGATIVE NEGATIVE Final    Comment:        The GeneXpert MRSA Assay (FDA approved for NASAL specimens only), is one component of a comprehensive MRSA colonization surveillance program. It is not intended to diagnose MRSA infection nor to guide or monitor treatment for MRSA infections.   Culture, blood (routine x 2)     Status: None (Preliminary result)   Collection Time: 07/23/16  4:10 PM  Result Value Ref Range Status   Specimen Description BLOOD RIGHT HAND  Final   Special Requests IN  PEDIATRIC BOTTLE Blood Culture adequate volume  Final   Culture NO GROWTH 2 DAYS  Final   Report Status PENDING  Incomplete  Culture, blood (routine x 2)     Status: None (Preliminary result)   Collection Time: 07/23/16  4:10 PM  Result Value Ref Range Status   Specimen Description BLOOD LEFT HAND  Final   Special Requests IN PEDIATRIC BOTTLE Blood Culture adequate volume  Final   Culture NO GROWTH 2 DAYS  Final   Report Status PENDING  Incomplete         Radiology Studies: Ct Foot Right W Contrast  Result Date: 07/24/2016 CLINICAL DATA:  Foot infection EXAM: CT OF THE LOWER RIGHT EXTREMITY WITH CONTRAST TECHNIQUE: Multidetector CT imaging of the lower right extremity was performed according to the standard protocol following intravenous contrast administration. COMPARISON:  06/07/2016 radiographs of the right foot CONTRAST:  28mL ISOVUE-300 IOPAMIDOL (ISOVUE-300) INJECTION 61% FINDINGS: Bones/Joint/Cartilage Cortical bone loss and destruction along the plantar aspect of the first distal phalanx. Adjacent soft tissue and intraosseous gas is noted within the first distal phalanx concerning for changes of osteomyelitis with adjacent soft tissue ulceration. No acute fracture of the visualized ankle and foot. The ankle and subtalar joints are maintained as are the midfoot articulations. Mild degenerate changes are noted across the ankle joint with subchondral cystic changes of the talar dome laterally. Ligaments Suboptimally assessed by CT. Muscles and Tendons No muscle atrophy or evidence pyomyositis. Soft tissues Soft tissue swelling and ulceration with soft tissue emphysema along the plantar aspect of the great toe near the tuft. Diffuse vascular calcifications of the ankle and foot consistent with history of diabetes. Soft tissue ulceration overlying the dorsal aspect of the calcaneus. No underlying bone destruction. Calcaneal enthesophytes are seen along the plantar dorsal aspect. Small metallic  linear radiopaque foreign body is seen along the plantar and lateral aspect of the hindfoot as on the plain radiographs. IMPRESSION: 1. Soft tissue swelling of the great toe with soft tissue emphysema and ulceration extending to the distal phalanx. Cortical bone loss (series 8, image 65)  is noted along the plantar aspect of the distal phalanx with intraosseous gas consistent with osteomyelitis. 2. Heel ulcer without underlying osteomyelitis of the calcaneus. 3. Thin linear metallic foreign body within the plantar lateral aspect of the soft tissues of the hindfoot (series 8, image 48). Electronically Signed   By: Ashley Royalty M.D.   On: 07/24/2016 00:19        Scheduled Meds: . enoxaparin (LOVENOX) injection  40 mg Subcutaneous Q24H  . folic acid  1 mg Oral Daily  . gabapentin  200 mg Oral TID  . insulin aspart  0-20 Units Subcutaneous TID WC  . insulin aspart  0-5 Units Subcutaneous QHS  . insulin detemir  15 Units Subcutaneous Daily  . methocarbamol  500 mg Oral TID  . multivitamin with minerals  1 tablet Oral Daily  . nicotine  14 mg Transdermal Daily  . thiamine  100 mg Oral Daily   Continuous Infusions:   LOS: 3 days   Author:  Berle Mull, MD [facility] Pager: [phone] 07/25/2016 4:55 PM     If 7PM-7AM, please contact night-coverage [email or web address] Password [facility] 07/25/2016, 4:55 PM

## 2016-07-25 NOTE — Progress Notes (Signed)
I have reviewed the angiogram images from today.  There are options for revascularization, however the patient was not able to lay still for the procedure, therefore, I will goto the OR tomorrow and try and improve his blood flow under general anesthesia.  [name]

## 2016-07-26 ENCOUNTER — Inpatient Hospital Stay (HOSPITAL_COMMUNITY): Payer: Medicare HMO | Admitting: Anesthesiology

## 2016-07-26 ENCOUNTER — Encounter (HOSPITAL_COMMUNITY): Payer: Self-pay | Admitting: Anesthesiology

## 2016-07-26 ENCOUNTER — Encounter (HOSPITAL_COMMUNITY)
Admission: AD | Disposition: A | Discharge status: Home Health | Payer: Self-pay | Source: Ambulatory Visit | Attending: Internal Medicine

## 2016-07-26 HISTORY — PX: LOWER EXTREMITY ANGIOGRAM: SHX5508

## 2016-07-26 HISTORY — PX: INSERTION OF ILIAC STENT: SHX6256

## 2016-07-26 HISTORY — PX: AMPUTATION TOE: SHX6595

## 2016-07-26 HISTORY — PX: PERIPHERAL VASCULAR BALLOON ANGIOPLASTY: CATH118281

## 2016-07-26 LAB — CBC
HEMATOCRIT: 32.3 % — AB (ref 39.0–52.0)
Hemoglobin: 11.1 g/dL — ABNORMAL LOW (ref 13.0–17.0)
MCH: 30.5 pg (ref 26.0–34.0)
MCHC: 34.4 g/dL (ref 30.0–36.0)
MCV: 88.7 fL (ref 78.0–100.0)
Platelets: 418 10*3/uL — ABNORMAL HIGH (ref 150–400)
RBC: 3.64 MIL/uL — ABNORMAL LOW (ref 4.22–5.81)
RDW: 12.5 % (ref 11.5–15.5)
WBC: 12.2 10*3/uL — ABNORMAL HIGH (ref 4.0–10.5)

## 2016-07-26 LAB — BASIC METABOLIC PANEL
Anion gap: 6 (ref 5–15)
BUN: 20 mg/dL (ref 6–20)
CALCIUM: 9 mg/dL (ref 8.9–10.3)
CO2: 28 mmol/L (ref 22–32)
Chloride: 103 mmol/L (ref 101–111)
Creatinine, Ser: 1.04 mg/dL (ref 0.61–1.24)
GFR calc non Af Amer: >60 mL/min (ref >60)
Glucose, Bld: 122 mg/dL — ABNORMAL HIGH (ref 65–99)
Potassium: 4.2 mmol/L (ref 3.5–5.1)
SODIUM: 137 mmol/L (ref 135–145)

## 2016-07-26 LAB — SURGICAL PCR SCREEN
MRSA, PCR: NEGATIVE
STAPHYLOCOCCUS AUREUS: NEGATIVE

## 2016-07-26 LAB — GLUCOSE, CAPILLARY
GLUCOSE-CAPILLARY: 146 mg/dL — AB (ref 65–99)
GLUCOSE-CAPILLARY: 248 mg/dL — AB (ref 65–99)
GLUCOSE-CAPILLARY: 180 mg/dL — AB (ref 65–99)
Glucose-Capillary: 333 mg/dL — ABNORMAL HIGH (ref 65–99)

## 2016-07-26 SURGERY — ANGIOGRAM, LOWER EXTREMITY
Anesthesia: General | Site: Groin | Laterality: Right

## 2016-07-26 MED ORDER — ROCURONIUM BROMIDE 50 MG/5ML IV SOSY
PREFILLED_SYRINGE | INTRAVENOUS | Status: AC
  Filled 2016-07-26: qty 5

## 2016-07-26 MED ORDER — SUGAMMADEX SODIUM 200 MG/2ML IV SOLN
INTRAVENOUS | Status: DC | PRN
  Administered 2016-07-26: 150 mg via INTRAVENOUS

## 2016-07-26 MED ORDER — ONDANSETRON HCL 4 MG/2ML IJ SOLN
INTRAMUSCULAR | Status: DC | PRN
  Administered 2016-07-26: 4 mg via INTRAVENOUS

## 2016-07-26 MED ORDER — MIDAZOLAM HCL 2 MG/2ML IJ SOLN
INTRAMUSCULAR | Status: AC
  Filled 2016-07-26: qty 2

## 2016-07-26 MED ORDER — PHENYLEPHRINE HCL 10 MG/ML IJ SOLN
INTRAMUSCULAR | Status: DC | PRN
  Administered 2016-07-26 (×3): 80 ug via INTRAVENOUS
  Administered 2016-07-26 (×2): 40 ug via INTRAVENOUS

## 2016-07-26 MED ORDER — INSULIN ASPART 100 UNIT/ML ~~LOC~~ SOLN
0.0000 [IU] | Freq: Three times a day (TID) | SUBCUTANEOUS | Status: DC
  Administered 2016-07-26: 15 [IU] via SUBCUTANEOUS
  Administered 2016-07-27: 11 [IU] via SUBCUTANEOUS
  Administered 2016-07-27 (×2): 20 [IU] via SUBCUTANEOUS
  Administered 2016-07-28: 4 [IU] via SUBCUTANEOUS
  Administered 2016-07-28 (×2): 7 [IU] via SUBCUTANEOUS
  Administered 2016-07-29: 3 [IU] via SUBCUTANEOUS
  Administered 2016-07-29: 4 [IU] via SUBCUTANEOUS
  Administered 2016-07-30: 15 [IU] via SUBCUTANEOUS
  Administered 2016-07-30: 7 [IU] via SUBCUTANEOUS

## 2016-07-26 MED ORDER — MIDAZOLAM HCL 5 MG/5ML IJ SOLN
INTRAMUSCULAR | Status: DC | PRN
  Administered 2016-07-26: 2 mg via INTRAVENOUS

## 2016-07-26 MED ORDER — KETAMINE HCL-SODIUM CHLORIDE 100-0.9 MG/10ML-% IV SOSY
PREFILLED_SYRINGE | INTRAVENOUS | Status: AC
  Filled 2016-07-26: qty 10

## 2016-07-26 MED ORDER — SODIUM CHLORIDE 0.9 % IJ SOLN: INTRAVENOUS | Status: DC | PRN

## 2016-07-26 MED ORDER — HYDROMORPHONE HCL 1 MG/ML IJ SOLN
INTRAMUSCULAR | Status: AC
  Filled 2016-07-26: qty 0.5

## 2016-07-26 MED ORDER — PHENYLEPHRINE HCL 10 MG/ML IJ SOLN
INTRAVENOUS | Status: DC | PRN
  Administered 2016-07-26: 25 ug/min via INTRAVENOUS

## 2016-07-26 MED ORDER — HYDROMORPHONE HCL 1 MG/ML IJ SOLN
0.2500 mg | INTRAMUSCULAR | Status: DC | PRN
  Administered 2016-07-26 (×2): 0.5 mg via INTRAVENOUS

## 2016-07-26 MED ORDER — SODIUM CHLORIDE 0.9 % IV SOLN: INTRAVENOUS | Status: DC

## 2016-07-26 MED ORDER — IODIXANOL 320 MG/ML IV SOLN
INTRAVENOUS | Status: DC | PRN
  Administered 2016-07-26: 60 mL via INTRA_ARTERIAL

## 2016-07-26 MED ORDER — 0.9 % SODIUM CHLORIDE (POUR BTL) OPTIME
TOPICAL | Status: DC | PRN
  Administered 2016-07-26: 1000 mL

## 2016-07-26 MED ORDER — HEPARIN SODIUM (PORCINE) 1000 UNIT/ML IJ SOLN
INTRAMUSCULAR | Status: DC | PRN
  Administered 2016-07-26 (×2): 1000 [IU] via INTRAVENOUS
  Administered 2016-07-26: 7000 [IU] via INTRAVENOUS

## 2016-07-26 MED ORDER — HYDROMORPHONE HCL 1 MG/ML IJ SOLN
INTRAMUSCULAR | Status: AC
  Administered 2016-07-26: 0.5 mg via INTRAVENOUS
  Filled 2016-07-26: qty 0.5

## 2016-07-26 MED ORDER — METOPROLOL TARTRATE 5 MG/5ML IV SOLN: 2.0000 mg | INTRAVENOUS | Status: DC | PRN

## 2016-07-26 MED ORDER — HEPARIN SODIUM (PORCINE) 1000 UNIT/ML IJ SOLN
INTRAMUSCULAR | Status: AC
  Filled 2016-07-26: qty 1

## 2016-07-26 MED ORDER — PHENYLEPHRINE 40 MCG/ML (10ML) SYRINGE FOR IV PUSH (FOR BLOOD PRESSURE SUPPORT)
PREFILLED_SYRINGE | INTRAVENOUS | Status: AC
  Filled 2016-07-26: qty 10

## 2016-07-26 MED ORDER — VANCOMYCIN HCL IN DEXTROSE 1-5 GM/200ML-% IV SOLN
1000.0000 mg | INTRAVENOUS | Status: AC
  Administered 2016-07-26: 1000 mg via INTRAVENOUS
  Filled 2016-07-26: qty 200

## 2016-07-26 MED ORDER — LABETALOL HCL 5 MG/ML IV SOLN
10.0000 mg | INTRAVENOUS | Status: DC | PRN
  Administered 2016-07-26: 10 mg via INTRAVENOUS
  Filled 2016-07-26: qty 4

## 2016-07-26 MED ORDER — DOCUSATE SODIUM 100 MG PO CAPS
100.0000 mg | ORAL_CAPSULE | Freq: Every day | ORAL | Status: DC
  Administered 2016-07-27 – 2016-07-30 (×4): 100 mg via ORAL
  Filled 2016-07-26 (×4): qty 1

## 2016-07-26 MED ORDER — LORAZEPAM 2 MG/ML IJ SOLN
1.0000 mg | Freq: Once | INTRAMUSCULAR | Status: AC
  Administered 2016-07-26: 1 mg via INTRAVENOUS

## 2016-07-26 MED ORDER — PHENOL 1.4 % MT LIQD: 1.0000 | OROMUCOSAL | Status: DC | PRN

## 2016-07-26 MED ORDER — FENTANYL CITRATE (PF) 100 MCG/2ML IJ SOLN
INTRAMUSCULAR | Status: DC | PRN
  Administered 2016-07-26: 150 ug via INTRAVENOUS

## 2016-07-26 MED ORDER — LORAZEPAM 2 MG/ML IJ SOLN
INTRAMUSCULAR | Status: AC
  Filled 2016-07-26: qty 1

## 2016-07-26 MED ORDER — GUAIFENESIN-DM 100-10 MG/5ML PO SYRP: 15.0000 mL | ORAL_SOLUTION | ORAL | Status: DC | PRN

## 2016-07-26 MED ORDER — LIDOCAINE 2% (20 MG/ML) 5 ML SYRINGE
INTRAMUSCULAR | Status: AC
  Filled 2016-07-26: qty 5

## 2016-07-26 MED ORDER — LACTATED RINGERS IV SOLN
INTRAVENOUS | Status: DC
  Administered 2016-07-26 (×2): via INTRAVENOUS

## 2016-07-26 MED ORDER — ONDANSETRON HCL 4 MG/2ML IJ SOLN
INTRAMUSCULAR | Status: AC
  Filled 2016-07-26: qty 2

## 2016-07-26 MED ORDER — SODIUM CHLORIDE 0.9 % IV SOLN
INTRAVENOUS | Status: DC | PRN
  Administered 2016-07-26: 500 mL

## 2016-07-26 MED ORDER — PROPOFOL 10 MG/ML IV BOLUS
INTRAVENOUS | Status: DC | PRN
Start: 2016-07-26 — End: 2016-07-26
  Administered 2016-07-26: 110 mg via INTRAVENOUS

## 2016-07-26 MED ORDER — KETAMINE HCL 10 MG/ML IJ SOLN
INTRAMUSCULAR | Status: DC | PRN
  Administered 2016-07-26: 10 mg via INTRAVENOUS
  Administered 2016-07-26: 30 mg via INTRAVENOUS
  Administered 2016-07-26: 10 mg via INTRAVENOUS
  Administered 2016-07-26: 30 mg via INTRAVENOUS

## 2016-07-26 MED ORDER — LIDOCAINE HCL (CARDIAC) 20 MG/ML IV SOLN
INTRAVENOUS | Status: DC | PRN
  Administered 2016-07-26: 60 mg via INTRAVENOUS

## 2016-07-26 MED ORDER — SUGAMMADEX SODIUM 200 MG/2ML IV SOLN
INTRAVENOUS | Status: AC
  Filled 2016-07-26: qty 2

## 2016-07-26 MED ORDER — PROPOFOL 10 MG/ML IV BOLUS
INTRAVENOUS | Status: AC
  Filled 2016-07-26: qty 20

## 2016-07-26 MED ORDER — PROTAMINE SULFATE 10 MG/ML IV SOLN
INTRAVENOUS | Status: AC
  Filled 2016-07-26: qty 5

## 2016-07-26 MED ORDER — SODIUM CHLORIDE 0.9 % IV SOLN: 500.0000 mL | Freq: Once | INTRAVENOUS | Status: DC | PRN

## 2016-07-26 MED ORDER — ROCURONIUM BROMIDE 100 MG/10ML IV SOLN
INTRAVENOUS | Status: DC | PRN
  Administered 2016-07-26: 30 mg via INTRAVENOUS
  Administered 2016-07-26: 10 mg via INTRAVENOUS

## 2016-07-26 MED ORDER — FENTANYL CITRATE (PF) 250 MCG/5ML IJ SOLN
INTRAMUSCULAR | Status: AC
  Filled 2016-07-26: qty 5

## 2016-07-26 MED ORDER — HYDROMORPHONE HCL 1 MG/ML IJ SOLN
0.5000 mg | Freq: Once | INTRAMUSCULAR | Status: AC
  Administered 2016-07-27: 0.5 mg via INTRAVENOUS
  Filled 2016-07-26: qty 1

## 2016-07-26 MED ORDER — PROMETHAZINE HCL 25 MG/ML IJ SOLN: 6.2500 mg | INTRAMUSCULAR | Status: DC | PRN

## 2016-07-26 SURGICAL SUPPLY — 79 items
ADH SKN CLS APL DERMABOND .7 (GAUZE/BANDAGES/DRESSINGS) ×2
ADH SKN CLS LQ APL DERMABOND (GAUZE/BANDAGES/DRESSINGS)
BAG BANDED W/RUBBER/TAPE 36X54 (MISCELLANEOUS) ×4 IMPLANT
BAG EQP BAND 135X91 W/RBR TAPE (MISCELLANEOUS) ×2
BAG SNAP BAND KOVER 36X36 (MISCELLANEOUS) ×4 IMPLANT
BALLN COYOTE OTW 3X60X150 (BALLOONS) ×4
BALLN MUSTANG 5X100X135 (BALLOONS) ×4
BALLN STERLING OTW 5X40X135 (BALLOONS) ×4
BALLOON COYOTE OTW 3X60X150 (BALLOONS) IMPLANT
BALLOON MUSTANG 5X100X135 (BALLOONS) IMPLANT
BALLOON STERLING OTW 5X40X135 (BALLOONS) IMPLANT
BANDAGE ACE 4X5 VEL STRL LF (GAUZE/BANDAGES/DRESSINGS) ×2 IMPLANT
BLADE SURG 11 STRL SS (BLADE) ×2 IMPLANT
BNDG GAUZE ELAST 4 BULKY (GAUZE/BANDAGES/DRESSINGS) ×2 IMPLANT
CANISTER SUCT 3000ML PPV (MISCELLANEOUS) ×4 IMPLANT
CATH ANGIO 5F BER2 100CM (CATHETERS) ×2 IMPLANT
CATH ANGIO 5F BER2 65CM (CATHETERS) IMPLANT
CATH CXI SUPP ANG 2.6FR 150CM (MICROCATHETER) ×2 IMPLANT
CATH OMNI FLUSH .035X70CM (CATHETERS) ×2 IMPLANT
CATH QUICKCROSS .035X135CM (MICROCATHETER) ×2 IMPLANT
CHLORAPREP W/TINT 26ML (MISCELLANEOUS) IMPLANT
COVER BACK TABLE 60X90IN (DRAPES) ×2 IMPLANT
COVER BACK TABLE 80X110 HD (DRAPES) ×2 IMPLANT
COVER DOME SNAP 22 D (MISCELLANEOUS) ×4 IMPLANT
COVER PROBE W GEL 5X96 (DRAPES) ×4 IMPLANT
COVER SURGICAL LIGHT HANDLE (MISCELLANEOUS) ×2 IMPLANT
DERMABOND ADHESIVE PROPEN (GAUZE/BANDAGES/DRESSINGS)
DERMABOND ADVANCED (GAUZE/BANDAGES/DRESSINGS) ×2
DERMABOND ADVANCED .7 DNX12 (GAUZE/BANDAGES/DRESSINGS) ×2 IMPLANT
DERMABOND ADVANCED .7 DNX6 (GAUZE/BANDAGES/DRESSINGS) ×2 IMPLANT
DEVICE CLOSURE PERCLS PRGLD 6F (VASCULAR PRODUCTS) IMPLANT
DEVICE TORQUE H2O (MISCELLANEOUS) ×2 IMPLANT
DRAPE FEMORAL ANGIO 80X135IN (DRAPES) ×2 IMPLANT
DRAPE X-RAY CASS 24X20 (DRAPES) IMPLANT
DRSG TEGADERM 4X4.75 (GAUZE/BANDAGES/DRESSINGS) ×2 IMPLANT
DRSG VAC ATS SM SENSATRAC (GAUZE/BANDAGES/DRESSINGS) ×2 IMPLANT
GAUZE SPONGE 2X2 8PLY STRL LF (GAUZE/BANDAGES/DRESSINGS) IMPLANT
GAUZE SPONGE 4X4 12PLY STRL LF (GAUZE/BANDAGES/DRESSINGS) ×2 IMPLANT
GAUZE SPONGE 4X4 16PLY XRAY LF (GAUZE/BANDAGES/DRESSINGS) ×4 IMPLANT
GLOVE BIOGEL PI IND STRL 7.0 (GLOVE) IMPLANT
GLOVE BIOGEL PI IND STRL 7.5 (GLOVE) ×2 IMPLANT
GLOVE BIOGEL PI INDICATOR 7.0 (GLOVE) ×2
GLOVE BIOGEL PI INDICATOR 7.5 (GLOVE) ×2
GLOVE ECLIPSE 6.5 STRL STRAW (GLOVE) ×2 IMPLANT
GLOVE SURG SS PI 7.5 STRL IVOR (GLOVE) ×4 IMPLANT
GOWN STRL REUS W/ TWL LRG LVL3 (GOWN DISPOSABLE) ×4 IMPLANT
GOWN STRL REUS W/ TWL XL LVL3 (GOWN DISPOSABLE) ×2 IMPLANT
GOWN STRL REUS W/TWL LRG LVL3 (GOWN DISPOSABLE) ×8
GOWN STRL REUS W/TWL XL LVL3 (GOWN DISPOSABLE) ×4
GUIDEWIRE ANGLED .035X150CM (WIRE) IMPLANT
GUIDEWIRE ANGLED .035X260CM (WIRE) ×2 IMPLANT
GUIDEWIRE STR TIP .014X300X8 (WIRE) ×2 IMPLANT
KIT BASIN OR (CUSTOM PROCEDURE TRAY) ×4 IMPLANT
KIT ENCORE 26 ADVANTAGE (KITS) ×2 IMPLANT
KIT ROOM TURNOVER OR (KITS) ×4 IMPLANT
NDL PERC 18GX7CM (NEEDLE) ×2 IMPLANT
NEEDLE PERC 18GX7CM (NEEDLE) ×4 IMPLANT
NS IRRIG 1000ML POUR BTL (IV SOLUTION) IMPLANT
PACK SURGICAL SETUP 50X90 (CUSTOM PROCEDURE TRAY) ×2 IMPLANT
PAD ARMBOARD 7.5X6 YLW CONV (MISCELLANEOUS) ×8 IMPLANT
PERCLOSE PROGLIDE 6F (VASCULAR PRODUCTS) ×8
PROTECTION STATION PRESSURIZED (MISCELLANEOUS) ×4
SHEATH AVANTI 11CM 5FR (MISCELLANEOUS) ×2 IMPLANT
SHEATH FLEXOR ANSEL 1 7F 45CM (SHEATH) ×2 IMPLANT
SPONGE GAUZE 2X2 STER 10/PKG (GAUZE/BANDAGES/DRESSINGS) ×2
STATION PROTECTION PRESSURIZED (MISCELLANEOUS) ×2 IMPLANT
STENT PROTEGE 6X40X120 (Stent) ×2 IMPLANT
STOPCOCK MORSE 400PSI 3WAY (MISCELLANEOUS) ×4 IMPLANT
SYR 10ML LL (SYRINGE) ×14 IMPLANT
SYR 20CC LL (SYRINGE) ×2 IMPLANT
SYR 30ML LL (SYRINGE) ×4 IMPLANT
SYR CONTROL 10ML LL (SYRINGE) ×2 IMPLANT
SYR MEDRAD MARK V 150ML (SYRINGE) IMPLANT
TOWEL GREEN STERILE (TOWEL DISPOSABLE) ×4 IMPLANT
TUBING HIGH PRESSURE 120CM (CONNECTOR) ×2 IMPLANT
WIRE BENTSON .035X145CM (WIRE) ×4 IMPLANT
WIRE G V18X300CM (WIRE) ×2 IMPLANT
WIRE MINI STICK MAX (SHEATH) ×3 IMPLANT
WND VAC CANISTER 500ML (MISCELLANEOUS) ×2 IMPLANT

## 2016-07-26 NOTE — Anesthesia Procedure Notes (Signed)
Procedure Name: Intubation Date/Time: 07/26/2016 9:42 AM Performed by: Jenne Campus Pre-anesthesia Checklist: Patient identified, Emergency Drugs available, Suction available and Patient being monitored Patient Re-evaluated:Patient Re-evaluated prior to inductionOxygen Delivery Method: Circle System Utilized Preoxygenation: Pre-oxygenation with 100% oxygen Intubation Type: IV induction Ventilation: Mask ventilation without difficulty Laryngoscope Size: Miller and 2 Grade View: Grade I Tube type: Oral Tube size: 7.5 mm Number of attempts: 1 Airway Equipment and Method: Stylet and Oral airway Placement Confirmation: ETT inserted through vocal cords under direct vision,  positive ETCO2 and breath sounds checked- equal and bilateral Secured at: 22 cm Tube secured with: Tape Dental Injury: Teeth and Oropharynx as per pre-operative assessment

## 2016-07-26 NOTE — Progress Notes (Signed)
Inpatient Diabetes Program Recommendations  AACE/ADA: New Consensus Statement on Inpatient Glycemic Control (2015)  Target Ranges:  Prepandial:   less than 140 mg/dL      Peak postprandial:   less than 180 mg/dL (1-2 hours)      Critically ill patients:  140 - 180 mg/dL   Lab Results  Component Value Date   GLUCAP 146 (H) 07/26/2016   HGBA1C 8.8 05/20/2016    Review of Glycemic Control  Post-prandial blood sugars elevated.  Recommendations: Add Novolog 3 units tidwc for meal coverage insulin Decrease Novolog to 0-15 units tidwc and hs, since pt is Type 1.  Consider d/cing on 70/30 bid dosing.  Will follow. Thank you. Lorenda Peck, RD, LDN, CDE Inpatient Diabetes Coordinator [phone]

## 2016-07-26 NOTE — Progress Notes (Signed)
R/t surgery procedure abdominal aortogram with bilateral runoff and possible intervention, pre-procedure checklist has been completed, all tasks performed.  Attempted to call RN at [phone] to give report, however, she will call back.

## 2016-07-26 NOTE — Care Management Important Message (Signed)
Important Message  Patient Details  Name: [name] MRN: [id number] Date of Birth: [birth year]   Medicare Important Message Given:  Yes    Orbie Pyo 07/26/2016, 2:18 PM

## 2016-07-26 NOTE — Progress Notes (Signed)
Patient has been impulsive and has had legs off the bed a few times. Bed alarm has been on and staff has been able to get to him quickly. Patient has been confused. He is able at times to answer orientation questions correctly but frequently is confused. Patient reports that he is happy he didn't have his foot amputated but concerned about how his toe looks. Explained to patient that his foot/big toe that was amputated now has a wound vac on it. Pt receptive to information but then asks again frequently about toe. Patient focused on foley catheter as well. He said he feels like he needs to urinate and is not happy about having a catheter. Support, encouragement, and education given to patient. Patient receptive. Will continue to monitor.

## 2016-07-26 NOTE — Progress Notes (Signed)
Paged Dr. Trula Slade regarding a blockage on the distal end of pt's woundvac (maximum suction 19mmHg). Verbal order received to take Vac dressing down and replace with wet to dry dressing. Order also received to advance diet to Carb Mod.

## 2016-07-26 NOTE — Anesthesia Postprocedure Evaluation (Signed)
Anesthesia Post Note  Patient: [name]  Procedure(s) Performed: Procedure(s) (LRB): LOWER EXTREMITY ANGIOGRAM (Right) AMPUTATION GREAT TOE (Right) INSERTION OF RIGHT POPLITEAL STENT WITH BALLOON ANGIOPLASTY (Right) PERIPHERAL VASCULAR BALLOON ANGIOPLASTY  RIGHT ANTERIOR TIBEAL ARTERY AND RIGHT SUPERFICIAL FEMORAL ARTERY (Right)  Patient location during evaluation: PACU Anesthesia Type: General Level of consciousness: awake and alert Pain management: pain level controlled Vital Signs Assessment: post-procedure vital signs reviewed and stable Respiratory status: spontaneous breathing, nonlabored ventilation, respiratory function stable and patient connected to nasal cannula oxygen Cardiovascular status: blood pressure returned to baseline and stable Postop Assessment: no signs of nausea or vomiting Anesthetic complications: no       Last Vitals:  Vitals:   07/26/16 1345 07/26/16 1400  BP: (!) 148/68 (!) 158/66  Pulse: 75 73  Resp: 17 13  Temp:      Last Pain:  Vitals:   07/26/16 1400  TempSrc:   PainSc: 0-No pain    LLE Motor Response: Purposeful movement (07/26/16 1400)   RLE Motor Response: Purposeful movement (07/26/16 1400)        Conley Delisle S

## 2016-07-26 NOTE — Anesthesia Preprocedure Evaluation (Addendum)
Anesthesia Evaluation  Patient identified by MRN, date of birth, ID band Patient awake    Reviewed: Allergy & Precautions, NPO status , Patient's Chart, lab work & pertinent test results  Airway Mallampati: II  TM Distance: >3 FB Neck ROM: Limited    Dental no notable dental hx. (+) Poor Dentition, Dental Advisory Given   Pulmonary COPD, Current Smoker,    Pulmonary exam normal breath sounds clear to auscultation       Cardiovascular hypertension, Normal cardiovascular exam Rhythm:Regular Rate:Normal  EF 60-65%   Neuro/Psych negative neurological ROS  negative psych ROS   GI/Hepatic negative GI ROS, (+)     substance abuse  cocaine use and IV drug use, Hepatitis -, C  Endo/Other  diabetes, Poorly Controlled, Type 1, Insulin Dependent  Renal/GU negative Renal ROS  negative genitourinary   Musculoskeletal negative musculoskeletal ROS (+)   Abdominal   Peds negative pediatric ROS (+)  Hematology negative hematology ROS (+)   Anesthesia Other Findings   Reproductive/Obstetrics negative OB ROS                            Anesthesia Physical Anesthesia Plan  ASA: III  Anesthesia Plan: General   Post-op Pain Management:    Induction: Intravenous  Airway Management Planned: Oral ETT  Additional Equipment:   Intra-op Plan:   Post-operative Plan: Extubation in OR  Informed Consent: I have reviewed the patients History and Physical, chart, labs and discussed the procedure including the risks, benefits and alternatives for the proposed anesthesia with the patient or authorized representative who has indicated his/her understanding and acceptance.   Dental advisory given  Plan Discussed with: CRNA and Surgeon  Anesthesia Plan Comments:         Anesthesia Quick Evaluation

## 2016-07-26 NOTE — Progress Notes (Signed)
PROGRESS NOTE    [name]  ZOX:[id number] DOB: [birth year] DOA: 07/22/2016 PCP: Renato Shin, MD   Brief Narrative:  Pt admitted on 07/22/16 directly from Dr. Evette Georges office. Accepted pt to [facility] service on 07/23/16 after discussing pt condition w/ Dr. Radford Pax.    As noted from his H&P [name] is a 61 y.o. male with a history of DM1 (uncontrolled), hep C, HTN, depression, drug abuse, tob use. He smoked crack last 3 days ago.  He has been having problems with his R great toe for over a month. It started after he was hospitalized with DKA and SIRS in January 2018. He had a R heel ulcer in January and was on antibiotics. He saw Dr Loanne Drilling 02/05 and was referred to a a wound specialist for the heel wound. He then developed the right great toe wound. The right heel wound is blackened but not draining and not eating larger. The right great toe wound has been worsening steadily. It has grown more swollen, or reddened, started blackening and his foot has become swollen.  In the last 3 or 4 days, he has developed chills and subjective fevers. He has general malaise and generally feels very bad. He is unable to walk on the foot. He has a supportive shoe and a walker that he uses to get around.  He is not having any cardiac symptoms. Specifically, he is not having any chest pain, no new dyspnea on exertion and shortness of breath. He denies orthopnea or PND. He is not having palpitations. He is having weakness and some dizziness. He has not been checking his blood sugars.  He has been smoking marijuana which helps with the pain.  Assessment & Plan:  R toe gangrene and cellulitis:  Peripheral vascular disease.  Pulses limited on exam. h/o PAD Started on broad-spectrum antibiotics vancomycin and cefepime on admission, currently discontinued as per discussion with infectious disease. Vascular surgery consulted as well as orthopedics. Underwent angiogram of the right leg on 07/25/2016. Followed by  amputation of great toe on the right as well as ingestion of the popliteal stent with balloon angioplasty on the right. Infectious diseases unable to consult on the patient due to patient remaining PACU. - Cannot get an MRI due to foreign body, received CT scan with contrast which is positive for osteomyelitis.  - ABI positive, angiogram shows blockages and SFA as well as from popliteal artery. Bilaterally. - BCX (ABX started 24hrs prior to obtaining unfortunately) no growth -Given history of drug abuse patient is at high risk for substance abuse with medication. Reviewed [facility] controlled substance access, no high-risk prescriptions in last 6 months. Initially started on scheduled OxyContin, change to Norco 6 hours as needed, placing the patient on scheduled Robaxin and increasing the dose of gabapentin. Continue Toradol.  Diabetes: last A1c 8.8 - Continue SSI Continue Levemir as well  PAD: previous studies in 2015 showing stenotic and occlusive disease bilaterally in LE.  As above, [place] surgery following. S/P popliteal stent implant with balloon angioplasty.  Peripheral neuropathy:  - Start Neurontin increased to 200 3 times a day - mgt of PAD/DM/HTN  HTN:  Intermittent and no h/o the same. May be from pain. - Hydralazine PRN - conisder initiating long term therapy prior to DC if remains elevated.   Chronic Hepatitis:  unsure of last appt.  We will monitor and check viral titers.  Tobacco/cocaine abuse:  40+pack year smoker. Endorses recent THC and cocaine use.  - NIcotine  patch - UDS positive for cocaine, benzodiazepine, cannabis.  DVT prophylaxis: Lovenox Code Status: Full Family Communication: None Disposition Plan: pending studies and eval by vascular/ortho   Consultants:   Vascular, infectious disease and Ortho consulted  Procedures:   None   Antimicrobials:  Vancomycin and cefepime, stopped on 07/25/2016 per ID.   Subjective: Remaining procedure  throughout the day, no acute events overnight.  Objective: Vitals:   07/26/16 1630 07/26/16 1645 07/26/16 1647 07/26/16 1651  BP:   (!) 161/57   Pulse: 80 84 82   Resp: 15 18 11    Temp:    97.4 F (36.3 C)  TempSrc:      SpO2: 99%  94%   Weight:      Height:        Intake/Output Summary (Last 24 hours) at 07/26/16 1656 Last data filed at 07/26/16 1626  Gross per 24 hour  Intake             1760 ml  Output             1270 ml  Net              490 ml   Filed Weights   07/23/16 0442 07/24/16 0346 07/26/16 0445  Weight: 70.8 kg (156 lb 1.6 oz) 70.6 kg (155 lb 11.2 oz) 69.6 kg (153 lb 6.4 oz)    Examination:  General exam: Appears calm and comfortable  Respiratory system: Clear to auscultation. Respiratory effort normal. Cardiovascular system: S1 & S2 heard, RRR. No JVD, murmurs, rubs, gallops or clicks. No pedal edema. Gastrointestinal system: Abdomen is nondistended, soft and nontender. No organomegaly or masses felt. Normal bowel sounds heard. Central nervous system: Alert and oriented. No focal neurological deficits. Extremities: Symmetric 5 x 5 power. Skin: Right foot edematous and erythema from toes to ankle with dry gangrene of the right great toe tip and right heel. Psychiatry: Judgement and insight appear normal. Mood & affect appropriate.     Data Reviewed: I have personally reviewed following labs and imaging studies  CBC:  Recent Labs Lab 07/22/16 1809 07/23/16 0704 07/24/16 0329 07/25/16 0240 07/26/16 0346  WBC 17.5* 15.5* 16.0* 13.1* 12.2*  NEUTROABS 13.9*  --   --   --   --   HGB 12.0* 12.0* 12.3* 10.9* 11.1*  HCT 34.4* 35.4* 36.3* 32.7* 32.3*  MCV 88.4 89.6 89.4 89.3 88.7  PLT 339 360 397 389 151*   Basic Metabolic Panel:  Recent Labs Lab 07/22/16 1809 07/23/16 0704 07/24/16 0329 07/25/16 0240 07/26/16 0346  NA 137 139 134* 135 137  K 4.0 4.0 4.2 4.0 4.2  CL 102 106 99* 101 103  CO2 27 28 27 27 28   GLUCOSE 115* 59* 147* 86 122*  BUN  13 14 9 11 20   CREATININE 1.05 0.93 0.99 0.98 1.04  CALCIUM 9.1 9.0 9.2 8.9 9.0   GFR: Estimated Creatinine Clearance: 71.5 mL/min (by C-G formula based on SCr of 1.04 mg/dL). Liver Function Tests:  Recent Labs Lab 07/22/16 1809 07/24/16 0329  AST 30 25  ALT 23 23  ALKPHOS 77 71  BILITOT 0.2* 0.5  PROT 6.6 6.6  ALBUMIN 2.5* 2.4*   No results for input(s): LIPASE, AMYLASE in the last 168 hours. No results for input(s): AMMONIA in the last 168 hours. Coagulation Profile:  Recent Labs Lab 07/25/16 0240  INR 1.00   Cardiac Enzymes: No results for input(s): CKTOTAL, CKMB, CKMBINDEX, TROPONINI in the last 168 hours. BNP (last 3  results) No results for input(s): PROBNP in the last 8760 hours. HbA1C: No results for input(s): HGBA1C in the last 72 hours. CBG:  Recent Labs Lab 07/25/16 1233 07/25/16 1602 07/25/16 2054 07/26/16 0738 07/26/16 1310  GLUCAP 194* 335* 101* 146* 248*   Lipid Profile:  Recent Labs  07/24/16 0329 07/25/16 1107  CHOL 162 153  HDL 37* 34*  LDLCALC 102* 100*  TRIG 115 97  CHOLHDL 4.4 4.5   Thyroid Function Tests: No results for input(s): TSH, T4TOTAL, FREET4, T3FREE, THYROIDAB in the last 72 hours. Anemia Panel: No results for input(s): VITAMINB12, FOLATE, FERRITIN, TIBC, IRON, RETICCTPCT in the last 72 hours. Sepsis Labs: No results for input(s): PROCALCITON, LATICACIDVEN in the last 168 hours.  Recent Results (from the past 240 hour(s))  MRSA PCR Screening     Status: None   Collection Time: 07/22/16  5:42 PM  Result Value Ref Range Status   MRSA by PCR NEGATIVE NEGATIVE Final    Comment:        The GeneXpert MRSA Assay (FDA approved for NASAL specimens only), is one component of a comprehensive MRSA colonization surveillance program. It is not intended to diagnose MRSA infection nor to guide or monitor treatment for MRSA infections.   Culture, blood (routine x 2)     Status: None (Preliminary result)   Collection Time:  07/23/16  4:10 PM  Result Value Ref Range Status   Specimen Description BLOOD RIGHT HAND  Final   Special Requests IN PEDIATRIC BOTTLE Blood Culture adequate volume  Final   Culture NO GROWTH 3 DAYS  Final   Report Status PENDING  Incomplete  Culture, blood (routine x 2)     Status: None (Preliminary result)   Collection Time: 07/23/16  4:10 PM  Result Value Ref Range Status   Specimen Description BLOOD LEFT HAND  Final   Special Requests IN PEDIATRIC BOTTLE Blood Culture adequate volume  Final   Culture NO GROWTH 3 DAYS  Final   Report Status PENDING  Incomplete  Surgical pcr screen     Status: None   Collection Time: 07/25/16  8:29 PM  Result Value Ref Range Status   MRSA, PCR NEGATIVE NEGATIVE Final   Staphylococcus aureus NEGATIVE NEGATIVE Final    Comment:        The Xpert SA Assay (FDA approved for NASAL specimens in patients over 11 years of age), is one component of a comprehensive surveillance program.  Test performance has been validated by [facility] for patients greater than or equal to 64 year old. It is not intended to diagnose infection nor to guide or monitor treatment.          Radiology Studies: No results found.      Scheduled Meds: . [START ON 07/27/2016] docusate sodium  100 mg Oral Daily  . [MAR Hold] enoxaparin (LOVENOX) injection  40 mg Subcutaneous Q24H  . [MAR Hold] folic acid  1 mg Oral Daily  . [MAR Hold] gabapentin  200 mg Oral TID  . HYDROmorphone      . [MAR Hold] insulin aspart  0-20 Units Subcutaneous TID WC  . [MAR Hold] insulin aspart  0-5 Units Subcutaneous QHS  . [MAR Hold] insulin detemir  15 Units Subcutaneous Daily  . LORazepam      . [MAR Hold] methocarbamol  500 mg Oral TID  . [MAR Hold] multivitamin with minerals  1 tablet Oral Daily  . [MAR Hold] nicotine  14 mg Transdermal Daily  . [facility]  Hold] thiamine  100 mg Oral Daily   Continuous Infusions: . sodium chloride    . lactated ringers 10 mL/hr at 07/26/16 0855      LOS: 4 days   Author:  Berle Mull, MD [facility] Pager: [phone] 07/26/2016 4:56 PM     If 7PM-7AM, please contact night-coverage [email or web address] Password [facility] 07/26/2016, 4:56 PM

## 2016-07-26 NOTE — Progress Notes (Signed)
PT Cancellation Note  Patient Details Name: KEANO GUGGENHEIM MRN: [id number] DOB: 15-Jun-1955   Cancelled Treatment:    Reason Eval/Treat Not Completed: Patient at procedure or test/unavailable (pt having vascular surgery. Will follow. )   Philomena Doheny 07/26/2016, 8:58 AM [phone]

## 2016-07-26 NOTE — Progress Notes (Signed)
    Subjective  - POD #1  Tearful over possibility of losing his hleg   Physical Exam:  Continued gangrene to right great toe with surrounding erythema Necrotic heel ulcer       Assessment/Plan:  POD #1  Discussed with patient, his only possibility to avoid BKA is an attempt at revascularization.  He could not tolerate the procedure yesterday, and general anesthesia was recommended.  I discussed an attempt at percutaneous revascularization.  If this is unsuccessful, I would consider anterior tibial bypass.  In addition, I will proceed with right great toe amputation  [name], [name] 07/26/2016 9:08 AM --  Vitals:   07/26/16 0000 07/26/16 0445  BP: (!) 149/90 (!) 156/68  Pulse: 84 79  Resp:  18  Temp:  98 F (36.7 C)    Intake/Output Summary (Last 24 hours) at 07/26/16 0908 Last data filed at 07/26/16 0600  Gross per 24 hour  Intake              775 ml  Output              475 ml  Net              300 ml     Laboratory CBC    Component Value Date/Time   WBC 12.2 (H) 07/26/2016 0346   HGB 11.1 (L) 07/26/2016 0346   HCT 32.3 (L) 07/26/2016 0346   PLT 418 (H) 07/26/2016 0346    BMET    Component Value Date/Time   NA 137 07/26/2016 0346   K 4.2 07/26/2016 0346   CL 103 07/26/2016 0346   CO2 28 07/26/2016 0346   GLUCOSE 122 (H) 07/26/2016 0346   BUN 20 07/26/2016 0346   CREATININE 1.04 07/26/2016 0346   CALCIUM 9.0 07/26/2016 0346   GFRNONAA >60 07/26/2016 0346   GFRAA >60 07/26/2016 0346    COAG Lab Results  Component Value Date   INR 1.00 07/25/2016   No results found for: PTT  Antibiotics Anti-infectives    Start     Dose/Rate Route Frequency Ordered Stop   07/26/16 0845  vancomycin (VANCOCIN) IVPB 1000 mg/200 mL premix     1,000 mg 200 mL/hr over 60 Minutes Intravenous To ShortStay Surgical 07/26/16 0842 07/27/16 0845   07/23/16 1000  vancomycin (VANCOCIN) IVPB 1000 mg/200 mL premix  Status:  Discontinued     1,000 mg 200 mL/hr over 60  Minutes Intravenous Every 12 hours 07/22/16 1957 07/25/16 1550   07/22/16 1800  ceFEPIme (MAXIPIME) 2 g in dextrose 5 % 50 mL IVPB  Status:  Discontinued     2 g 100 mL/hr over 30 Minutes Intravenous Every 12 hours 07/22/16 1702 07/25/16 1550   07/22/16 1715  vancomycin (VANCOCIN) 1,500 mg in sodium chloride 0.9 % 500 mL IVPB     1,500 mg 250 mL/hr over 120 Minutes Intravenous  Once 07/22/16 1706 07/22/16 2053       V. Leia Alf, M.D. [facility] [place] Office: [phone] Pager:  [phone]

## 2016-07-26 NOTE — Op Note (Signed)
Patient name: BUD KAESER MRN: [id number] DOB: 13-Feb-1956 Sex: male  07/22/2016 - 07/26/2016 Pre-operative Diagnosis: right foot infection Post-operative diagnosis:  Same Surgeon:  Annamarie Major Procedure Performed:  1.  Ultrasound-guided access, left femoral artery  2.  Right lower extremity runoff  3.  Angioplasty, right anterior tibial artery  4.  Angioplasty , right superficial femoral and popliteal artery  5.  Stent, right popliteal artery  6.  Amputation of right great toe including metatarsal head  7.  Closure device  8. VAC placement to right great toe   Indications:  The patient presented with a diabetic foot infection and necrotic right toe. He underwent angiography yesterday which showed diffuse multilevel vascular disease. It was recommended that he undergo intervention with general anesthesia. I discussed this with the patient this morning. He understands this is a limb threatening situation and wants to proceed.  Procedure:  The patient was identified in the holding area and taken to room 8.  The patient was then placed supine on the table and prepped and draped in the usual sterile fashion.  A time out was called.  Ultrasound was used to evaluate the left common femoral artery.  It was patent .  A digital ultrasound image was acquired.  A micropuncture needle was used to access the left common femoral artery under ultrasound guidance.  An 018 wire was advanced without resistance and a micropuncture sheath was placed.  The 018 wire was removed and a benson wire was placed.  The micropuncture sheath was exchanged for a 5 french sheath.  An omniflush catheter the wire was placed in the right external iliac artery. I then inserted a 7 x 45 cm and so once she went to the right, femoral artery. The patient was fully heparinized. Using a 035 glide wire and a quick cross catheter, I was able to advance the catheter wire down to the popliteal occlusion. I then performed recannulization of  the popliteal artery. This was confirmed with a contrast injection through the quick cross catheter and the distal popliteal artery. The guidewire and quick cross catheter was then used to select the anterior tibial artery. I used a the 14 wire and a 3 x 60 coyote balloon to cross the anterior tibial artery occlusion. I then performed balloon angioplasty of the midsection of the anterior tibial artery, the proximal anterior tibial artery just beyond the interosseous crossing, the popliteal and superficial femoral artery. I then performed a arteriogram. This shows in-line flow through the popliteal artery, however there was a nonfocal and the dissection. There was in-line flow through the anterior tibial artery without significant residual stenosis. Because of the dissection within the popliteal artery upsized to a 5 mm balloon and performed a prolonged inflation for 2 minutes abdominal pressure. I also did this at the stenosis within the adductor canal. A follow-up arteriogram revealed residual dissection with possible thrombus in the subintimal plane. I felt this should be stented and therefore I selected a 6 x 40 EV3 stent and molded it with a 5 mm balloon. This was placed at the level of the joint space. I then performed a completion arteriogram. There was some residual stenosis within the adductor canal but in-line flow through the anterior tibial artery. The patient had significantly improved blood flow and therefore this point in time I elected not to proceed with additional treatment of the superficial femoral and proximal popliteal artery. Catheters and wires were removed. A probe glide device was used for  closure of the left groin.  Attention was then turned towards the right great toe. A fishmouth incision was made at the base of the toe and carried down to the bone with a 10 blade. A large bone cutter was then used to transect the digit. I used Rogers to remove the metatarsal head. At this level there  was healthy bone. There was also very good bleeding from the exposed tissue. Once the toe was removed, hemostasis was achieved the wound was irrigated and a wound VAC was placed. The patient tolerated procedure well there were no immediate complications.   Theotis Burrow, M.D. [facility] [place] Office: [phone] Pager:  [phone]

## 2016-07-26 NOTE — Transfer of Care (Signed)
Immediate Anesthesia Transfer of Care Note  Patient: [name]  Procedure(s) Performed: Procedure(s): LOWER EXTREMITY ANGIOGRAM (Right) AMPUTATION GREAT TOE (Right) INSERTION OF RIGHT POPLITEAL STENT WITH BALLOON ANGIOPLASTY (Right) PERIPHERAL VASCULAR BALLOON ANGIOPLASTY  RIGHT ANTERIOR TIBEAL ARTERY AND RIGHT SUPERFICIAL FEMORAL ARTERY (Right)  Patient Location: PACU  Anesthesia Type:General  Level of Consciousness: awake and pateint uncooperative  Airway & Oxygen Therapy: Patient Spontanous Breathing and Patient connected to face mask oxygen  Post-op Assessment: Report given to RN and Post -op Vital signs reviewed and stable  Post vital signs: Reviewed  Last Vitals:  Vitals:   07/26/16 0000 07/26/16 0445  BP: (!) 149/90 (!) 156/68  Pulse: 84 79  Resp:  18  Temp:  36.7 C    Last Pain:  Vitals:   07/26/16 0733  TempSrc:   PainSc: 2       Patients Stated Pain Goal: 2 (29/93/71 6967)  Complications: No apparent anesthesia complications

## 2016-07-27 ENCOUNTER — Inpatient Hospital Stay (HOSPITAL_COMMUNITY): Payer: Medicare HMO

## 2016-07-27 DIAGNOSIS — F17210 Nicotine dependence, cigarettes, uncomplicated: Secondary | ICD-10-CM

## 2016-07-27 DIAGNOSIS — F1990 Other psychoactive substance use, unspecified, uncomplicated: Secondary | ICD-10-CM

## 2016-07-27 DIAGNOSIS — L97412 Non-pressure chronic ulcer of right heel and midfoot with fat layer exposed: Secondary | ICD-10-CM

## 2016-07-27 DIAGNOSIS — Z833 Family history of diabetes mellitus: Secondary | ICD-10-CM

## 2016-07-27 DIAGNOSIS — Z841 Family history of disorders of kidney and ureter: Secondary | ICD-10-CM

## 2016-07-27 DIAGNOSIS — L97418 Non-pressure chronic ulcer of right heel and midfoot with other specified severity: Secondary | ICD-10-CM

## 2016-07-27 DIAGNOSIS — Z89411 Acquired absence of right great toe: Secondary | ICD-10-CM

## 2016-07-27 DIAGNOSIS — M795 Residual foreign body in soft tissue: Secondary | ICD-10-CM

## 2016-07-27 DIAGNOSIS — N181 Chronic kidney disease, stage 1: Secondary | ICD-10-CM

## 2016-07-27 DIAGNOSIS — E10621 Type 1 diabetes mellitus with foot ulcer: Secondary | ICD-10-CM

## 2016-07-27 DIAGNOSIS — Z8739 Personal history of other diseases of the musculoskeletal system and connective tissue: Secondary | ICD-10-CM

## 2016-07-27 DIAGNOSIS — Z8261 Family history of arthritis: Secondary | ICD-10-CM

## 2016-07-27 DIAGNOSIS — Z8249 Family history of ischemic heart disease and other diseases of the circulatory system: Secondary | ICD-10-CM

## 2016-07-27 DIAGNOSIS — M869 Osteomyelitis, unspecified: Secondary | ICD-10-CM

## 2016-07-27 DIAGNOSIS — Z885 Allergy status to narcotic agent status: Secondary | ICD-10-CM

## 2016-07-27 DIAGNOSIS — E1022 Type 1 diabetes mellitus with diabetic chronic kidney disease: Secondary | ICD-10-CM

## 2016-07-27 LAB — BASIC METABOLIC PANEL
ANION GAP: 13 (ref 5–15)
BUN: 19 mg/dL (ref 6–20)
CALCIUM: 8.8 mg/dL — AB (ref 8.9–10.3)
CO2: 24 mmol/L (ref 22–32)
Chloride: 98 mmol/L — ABNORMAL LOW (ref 101–111)
Creatinine, Ser: 1.18 mg/dL (ref 0.61–1.24)
GFR calc Af Amer: >60 mL/min (ref >60)
GFR calc non Af Amer: >60 mL/min (ref >60)
GLUCOSE: 405 mg/dL — AB (ref 65–99)
Potassium: 5 mmol/L (ref 3.5–5.1)
Sodium: 135 mmol/L (ref 135–145)

## 2016-07-27 LAB — GLUCOSE, CAPILLARY
GLUCOSE-CAPILLARY: 462 mg/dL — AB (ref 65–99)
GLUCOSE-CAPILLARY: 296 mg/dL — AB (ref 65–99)
Glucose-Capillary: 483 mg/dL — ABNORMAL HIGH (ref 65–99)
Glucose-Capillary: 360 mg/dL — ABNORMAL HIGH (ref 65–99)
Glucose-Capillary: 99 mg/dL (ref 65–99)

## 2016-07-27 LAB — CBC
HEMATOCRIT: 30.3 % — AB (ref 39.0–52.0)
HEMOGLOBIN: 10 g/dL — AB (ref 13.0–17.0)
MCH: 29.7 pg (ref 26.0–34.0)
MCHC: 33 g/dL (ref 30.0–36.0)
MCV: 89.9 fL (ref 78.0–100.0)
Platelets: 417 10*3/uL — ABNORMAL HIGH (ref 150–400)
RBC: 3.37 MIL/uL — AB (ref 4.22–5.81)
RDW: 12.3 % (ref 11.5–15.5)
WBC: 13.5 10*3/uL — ABNORMAL HIGH (ref 4.0–10.5)

## 2016-07-27 LAB — HCV RNA QUANT
HCV Quantitative Log: 4.656 log10 IU/mL (ref >1.70)
HCV Quantitative: 45300 IU/mL (ref >50)

## 2016-07-27 MED ORDER — HYDROMORPHONE HCL 1 MG/ML IJ SOLN
0.5000 mg | Freq: Once | INTRAMUSCULAR | Status: AC
Start: 2016-07-27 — End: 2016-07-27
  Administered 2016-07-27: 0.5 mg via INTRAVENOUS
  Filled 2016-07-27: qty 1

## 2016-07-27 MED ORDER — INSULIN ASPART 100 UNIT/ML ~~LOC~~ SOLN
10.0000 [IU] | Freq: Three times a day (TID) | SUBCUTANEOUS | Status: DC
  Administered 2016-07-27 (×2): 10 [IU] via SUBCUTANEOUS

## 2016-07-27 MED ORDER — LINEZOLID 600 MG PO TABS
600.0000 mg | ORAL_TABLET | Freq: Two times a day (BID) | ORAL | Status: DC
  Administered 2016-07-27 – 2016-07-30 (×7): 600 mg via ORAL
  Filled 2016-07-27 (×8): qty 1

## 2016-07-27 MED ORDER — GLUCERNA SHAKE PO LIQD
237.0000 mL | Freq: Two times a day (BID) | ORAL | Status: DC
  Administered 2016-07-27 – 2016-07-28 (×3): 237 mL via ORAL

## 2016-07-27 MED ORDER — INSULIN DETEMIR 100 UNIT/ML ~~LOC~~ SOLN
18.0000 [IU] | Freq: Every day | SUBCUTANEOUS | Status: DC
  Administered 2016-07-27: 18 [IU] via SUBCUTANEOUS
  Filled 2016-07-27 (×3): qty 0.18

## 2016-07-27 MED ORDER — INSULIN ASPART 100 UNIT/ML ~~LOC~~ SOLN: 10.0000 [IU] | Freq: Three times a day (TID) | SUBCUTANEOUS | Status: DC

## 2016-07-27 NOTE — Progress Notes (Addendum)
PROGRESS NOTE    [name]  PTW:[id number] DOB: 12/30/1955 DOA: 07/22/2016 PCP: No primary care provider on file.   No chief complaint on file.    Brief Narrative:  Pt admitted on 07/22/16 directly from Dr. Evette Georges office. Accepted pt to [facility] service on 07/23/16 after discussing pt condition w/ Dr. Radford Pax.  As noted from his H&P [name] a 60 y.o.malewith a history of DM1 (uncontrolled), hep C, HTN, depression, drug abuse, tob use. He smoked crack last 3 days ago. He has been having problems with his R great toe for over a month. It started after he was hospitalized with DKA and SIRS in January 2018. He had a R heel ulcer in January and was on antibiotics. He saw Dr Loanne Drilling 02/05 and was referred to a a wound specialist for the heel wound. He then developed the right great toe wound. The right heel wound is blackened but not draining and not eating larger. The right great toe wound has been worsening steadily. It has grown more swollen, or reddened, started blackening and his foot has become swollen. In the last 3 or 4 days, he has developed chills and subjective fevers. He has general malaise and generally feels very bad. He is unable to walk on the foot. He has a supportive shoe and a walker that he uses to get around. He is not having any cardiac symptoms. Specifically, he is not having any chest pain, no new dyspnea on exertion and shortness of breath. He denies orthopnea or PND. He is not having palpitations. He is having weakness and some dizziness. He has not been checking his blood sugars. He has been smoking marijuana which helps with the pain.  Assessment & Plan   R toe gangrene and cellulitis/Peripheral vascular disease. -Pulses limited on exam. h/o PAD -Started on broad-spectrum antibiotics vancomycin and cefepime on admission, currently discontinued as per discussion with infectious disease. -Vascular surgery and orthopedics consulted and appreciated -Underwent angiogram of  the right leg on 07/25/2016. Followed by amputation of great toe on the right as well as ingestion of the popliteal stent with balloon angioplasty on the right. -Infectious disease consulted and appreciated -Cannot get an MRI due to foreign body, received CT scan with contrast which is positive for osteomyelitis.  -ABI positive, angiogram shows blockages and SFA as well as from popliteal artery. Bilaterally. -BCX (ABX started 24hrs prior to obtaining unfortunately) no growth -Given history of drug abuse patient is at high risk for substance abuse with medication. -Reviewed [place] controlled substance access, no high-risk prescriptions in last 6 months. -Initially started on scheduled OxyContin, change to Norco 6 hours as needed, placing the patient on scheduled Robaxin and increasing the dose of gabapentin. Continue Toradol. -Will xray right foot again, as he states he pulled out a metal object. -Plan for wound vac to be placed today -ID placing patient on Zyvox BID, for at least 3 weeks with outpatient follow up in 3-4 weeks.   Diabetes mellitus -last A1c 8.8 -Continue levemir, will increase today -continue ISS and CBG monitoring   PAD -previous studies in 2015 showing stenotic and occlusive disease bilaterally in LE.  -S/P popliteal stent implant with balloon angioplasty.  Peripheral neuropathy -Continue Neurontin increased to 200 3 times a day  Hypertension -possible exacerbated due to pain- currently stable  -Hydralazine PRN -conisder initiating long term therapy prior to DC if remains elevated.   Chronic Hepatitis -unsure of last appt.  -HCV RNA quant pending -ID  consulted and appreciated  Tobacco/cocaine abuse -40+pack year smoker. Endorses recent THC and cocaine use.  -NIcotine patch -UDS positive for cocaine, benzodiazepine, cannabis.  DVT Prophylaxis  Lovenox  Code Status: DNR- per patient, per living will  Family Communication: None at  bedside  Disposition Plan: Admitted.   Consultants Vascular surgery Orthopedic surgery  Procedures  S/p amputation of right great toe   Antibiotics   Anti-infectives    Start     Dose/Rate Route Frequency Ordered Stop   07/26/16 0845  vancomycin (VANCOCIN) IVPB 1000 mg/200 mL premix     1,000 mg 200 mL/hr over 60 Minutes Intravenous To [facility] 07/26/16 0842 07/26/16 1027   07/23/16 1000  vancomycin (VANCOCIN) IVPB 1000 mg/200 mL premix  Status:  Discontinued     1,000 mg 200 mL/hr over 60 Minutes Intravenous Every 12 hours 07/22/16 1957 07/25/16 1550   07/22/16 1800  ceFEPIme (MAXIPIME) 2 g in dextrose 5 % 50 mL IVPB  Status:  Discontinued     2 g 100 mL/hr over 30 Minutes Intravenous Every 12 hours 07/22/16 1702 07/25/16 1550   07/22/16 1715  vancomycin (VANCOCIN) 1,500 mg in sodium chloride 0.9 % 500 mL IVPB     1,500 mg 250 mL/hr over 120 Minutes Intravenous  Once 07/22/16 1706 07/22/16 2053      Subjective:   [name] seen and examined today. Continues to complain of pain and feels his pain is not controlled. Upset that he is though of as a drug abuser.  States he pulled a metal object from his foot. Denies chest pain, shortness of breath, abdominal pain.   Objective:   Vitals:   07/26/16 2345 07/26/16 2348 07/27/16 0329 07/27/16 0500  BP:  (!) 123/53 (!) 159/78   Pulse: 81 81 83   Resp: 18 16 16    Temp: 98.5 F (36.9 C) 98.5 F (36.9 C) 98.5 F (36.9 C)   TempSrc: Oral Oral Oral   SpO2:  98% 97%   Weight:    74.3 kg (163 lb 12.8 oz)  Height:        Intake/Output Summary (Last 24 hours) at 07/27/16 0750 Last data filed at 07/27/16 0500  Gross per 24 hour  Intake             3350 ml  Output             2295 ml  Net             1055 ml   Filed Weights   07/26/16 0445 07/26/16 1714 07/27/16 0500  Weight: 69.6 kg (153 lb 6.4 oz) 74 kg (163 lb 2.3 oz) 74.3 kg (163 lb 12.8 oz)    Exam  General: Well developed, well nourished, NAD, appears  stated age  HEENT: NCAT,  mucous membranes moist.   Cardiovascular: S1 S2 auscultated, no rubs, murmurs or gallops. Regular rate and rhythm.  Respiratory: Clear to auscultation bilaterally with equal chest rise  Abdomen: Soft, nontender, nondistended, + bowel sounds  Extremities: warm dry without cyanosis clubbing or edema. Right toe amputation site clean.   Neuro: AAOx3, nonfoal  Psych: Anxious     Data Reviewed: I have personally reviewed following labs and imaging studies  CBC:  Recent Labs Lab 07/22/16 1809 07/23/16 0704 07/24/16 0329 07/25/16 0240 07/26/16 0346 07/27/16 0204  WBC 17.5* 15.5* 16.0* 13.1* 12.2* 13.5*  NEUTROABS 13.9*  --   --   --   --   --   HGB 12.0* 12.0*  12.3* 10.9* 11.1* 10.0*  HCT 34.4* 35.4* 36.3* 32.7* 32.3* 30.3*  MCV 88.4 89.6 89.4 89.3 88.7 89.9  PLT 339 360 397 389 418* 542*   Basic Metabolic Panel:  Recent Labs Lab 07/23/16 0704 07/24/16 0329 07/25/16 0240 07/26/16 0346 07/27/16 0204  NA 139 134* 135 137 135  K 4.0 4.2 4.0 4.2 5.0  CL 106 99* 101 103 98*  CO2 28 27 27 28 24   GLUCOSE 59* 147* 86 122* 405*  BUN 14 9 11 20 19   CREATININE 0.93 0.99 0.98 1.04 1.18  CALCIUM 9.0 9.2 8.9 9.0 8.8*   GFR: Estimated Creatinine Clearance: 63 mL/min (by C-G formula based on SCr of 1.18 mg/dL). Liver Function Tests:  Recent Labs Lab 07/22/16 1809 07/24/16 0329  AST 30 25  ALT 23 23  ALKPHOS 77 71  BILITOT 0.2* 0.5  PROT 6.6 6.6  ALBUMIN 2.5* 2.4*   No results for input(s): LIPASE, AMYLASE in the last 168 hours. No results for input(s): AMMONIA in the last 168 hours. Coagulation Profile:  Recent Labs Lab 07/25/16 0240  INR 1.00   Cardiac Enzymes: No results for input(s): CKTOTAL, CKMB, CKMBINDEX, TROPONINI in the last 168 hours. BNP (last 3 results) No results for input(s): PROBNP in the last 8760 hours. HbA1C: No results for input(s): HGBA1C in the last 72 hours. CBG:  Recent Labs Lab 07/25/16 2054  07/26/16 0738 07/26/16 1310 07/26/16 1758 07/26/16 2204  GLUCAP 101* 146* 248* 333* 180*   Lipid Profile:  Recent Labs  07/25/16 1107  CHOL 153  HDL 34*  LDLCALC 100*  TRIG 97  CHOLHDL 4.5   Thyroid Function Tests: No results for input(s): TSH, T4TOTAL, FREET4, T3FREE, THYROIDAB in the last 72 hours. Anemia Panel: No results for input(s): VITAMINB12, FOLATE, FERRITIN, TIBC, IRON, RETICCTPCT in the last 72 hours. Urine analysis:    Component Value Date/Time   COLORURINE STRAW (A) 04/18/2016 1333   APPEARANCEUR CLEAR 04/18/2016 1333   LABSPEC 1.020 04/18/2016 1333   PHURINE 5.0 04/18/2016 1333   GLUCOSEU >=500 (A) 04/18/2016 1333   GLUCOSEU 500 04/28/2012 1149   HGBUR SMALL (A) 04/18/2016 1333   BILIRUBINUR NEGATIVE 04/18/2016 1333   BILIRUBINUR neg 04/28/2012 1053   KETONESUR 20 (A) 04/18/2016 1333   PROTEINUR 100 (A) 04/18/2016 1333   UROBILINOGEN 4.0 (H) 11/16/2013 1646   NITRITE NEGATIVE 04/18/2016 1333   LEUKOCYTESUR NEGATIVE 04/18/2016 1333   Sepsis Labs: @LABRCNTIP (procalcitonin:4,lacticidven:4)  ) Recent Results (from the past 240 hour(s))  MRSA PCR Screening     Status: None   Collection Time: 07/22/16  5:42 PM  Result Value Ref Range Status   MRSA by PCR NEGATIVE NEGATIVE Final    Comment:        The GeneXpert MRSA Assay (FDA approved for NASAL specimens only), is one component of a comprehensive MRSA colonization surveillance program. It is not intended to diagnose MRSA infection nor to guide or monitor treatment for MRSA infections.   Culture, blood (routine x 2)     Status: None (Preliminary result)   Collection Time: 07/23/16  4:10 PM  Result Value Ref Range Status   Specimen Description BLOOD RIGHT HAND  Final   Special Requests IN PEDIATRIC BOTTLE Blood Culture adequate volume  Final   Culture NO GROWTH 3 DAYS  Final   Report Status PENDING  Incomplete  Culture, blood (routine x 2)     Status: None (Preliminary result)   Collection  Time: 07/23/16  4:10 PM  Result Value  Ref Range Status   Specimen Description BLOOD LEFT HAND  Final   Special Requests IN PEDIATRIC BOTTLE Blood Culture adequate volume  Final   Culture NO GROWTH 3 DAYS  Final   Report Status PENDING  Incomplete  Surgical pcr screen     Status: None   Collection Time: 07/25/16  8:29 PM  Result Value Ref Range Status   MRSA, PCR NEGATIVE NEGATIVE Final   Staphylococcus aureus NEGATIVE NEGATIVE Final    Comment:        The Xpert SA Assay (FDA approved for NASAL specimens in patients over 65 years of age), is one component of a comprehensive surveillance program.  Test performance has been validated by [facility] for patients greater than or equal to 64 year old. It is not intended to diagnose infection nor to guide or monitor treatment.       Radiology Studies: No results found.   Scheduled Meds: . docusate sodium  100 mg Oral Daily  . enoxaparin (LOVENOX) injection  40 mg Subcutaneous Q24H  . folic acid  1 mg Oral Daily  . gabapentin  200 mg Oral TID  . insulin aspart  0-20 Units Subcutaneous TID WC  . insulin aspart  0-5 Units Subcutaneous QHS  . insulin detemir  15 Units Subcutaneous Daily  . methocarbamol  500 mg Oral TID  . multivitamin with minerals  1 tablet Oral Daily  . nicotine  14 mg Transdermal Daily  . thiamine  100 mg Oral Daily   Continuous Infusions: . sodium chloride Stopped (07/27/16 0500)  . lactated ringers 10 mL/hr at 07/26/16 0855     LOS: 5 days   Time Spent in minutes   30 minutes  Gracieann Stannard D.O. on 07/27/2016 at 7:50 AM  Between 7am to 7pm - Pager - [phone]  After 7pm go to [email or web address] - password [email or web address]  And look for the night coverage person covering for me after hours  [facility] Office  [phone]

## 2016-07-27 NOTE — Consult Note (Signed)
Date of Admission:  07/22/2016  Date of Consult:  07/27/2016  Reason for Consult: DFU with Vascular disease and osteomyelitis Referring Physician: Dr. Ree Kida   HPI: [name] is an 61 y.o. male with poorly controlled diabetes mellitus and right great toe ulcer and right heel ulcer.  He has been followed by the wound care center and had been referred there by Dr. Loanne Drilling. He developed a great toe wound which worsened along with blackening of his heel wound. [place] [address] and began to worsen further. It began to become more edematous erythematous and began blackening with the entire foot becoming [place]. 3-4 days prior to admission he had chills and subjective fevers and malaise and Oddi aches. He was unable to walk on his foot. Also smokes cigarettes in addition to using cocaine and marijuana he avidly disease denies ever using intravenous opiates though he is positive for hepatitis C he states that he has been free of alcohol for many years now. Patient was initially admitted to the cardiology service and also transferred over to try at hospitalist. Verdis Frederickson the foot was not possible due to the presence of a foreign body. He had a CT with contrast which showed evidence of osteomyelitis in his toe. He had an ABI that also showed vascular disease he underwent Joetta Manners showing blockages at the SFA and popliteal artery. He has been on vancomycin and cefepime now narrowed to vancomycin alone. He has been evaluated by vascular surgery who performed surgery yesterday on 07/26/16 with :    1.  Ultrasound-guided access, left femoral artery             2.  Right lower extremity runoff             3.  Angioplasty, right anterior tibial artery             4.  Angioplasty , right superficial femoral and popliteal artery             5.  Stent, right popliteal artery             6.  Amputation of right great toe including metatarsal head             7.  Closure device             8. VAC placement to right  great toe   UNFORTUNATELY THE AMPUTATED TOE DOES NOT APPEAR TO HAVE BEEN SENT FOR CULTURE NOR DO I SEE ANY OTHER DEEP CULTURES HAVING BEEN SENT.  He is recovering from surgery. He is very upset about being (in his opinion) accused of falsely being using intravenous drugs.  Past Medical History:  Diagnosis Date  . DEPRESSION   . DIABETES MELLITUS, TYPE I   . DRUG ABUSE    pt should have NO controlled substances rx'ed  . Glaucoma   . HEPATITIS C    chronic  . Hypertension 02/18/2011  . 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 Vitiligo     Past Surgical History:  Procedure Laterality Date  . ABDOMINAL AORTOGRAM W/LOWER EXTREMITY N/A 07/25/2016   Procedure: Abdominal Aortogram w/Bilateral Lower Extremity Runoff;  Surgeon: Conrad Adamsville, MD;  Location: [facility];  Service: Cardiovascular;  Laterality: N/A;  . EYE SURGERY     retinal surgery x 2, right eye    Social History:  reports that he has been smoking Cigarettes.  He has been smoking about 2.00 packs per day. He has  never used smokeless tobacco. He reports that he drinks alcohol. He reports that he uses drugs, including "Crack" cocaine, Marijuana, and Heroin.   Family History  Problem Relation Age of Onset  . Diabetes Father   . Kidney disease Father   . Arthritis Mother   . Heart disease Mother     CAD    Allergies  Allergen Reactions  . Codeine Itching     Medications: I have reviewed patients current medications as documented in Epic Anti-infectives    Start     Dose/Rate Route Frequency Ordered Stop   07/27/16 1000  linezolid (ZYVOX) tablet 600 mg     600 mg Oral Every 12 hours 07/27/16 0909     07/26/16 0845  vancomycin (VANCOCIN) IVPB 1000 mg/200 mL premix     1,000 mg 200 mL/hr over 60 Minutes Intravenous To ShortStay Surgical 07/26/16 0842 07/26/16 1027   07/23/16 1000  vancomycin (VANCOCIN) IVPB 1000 mg/200 mL premix  Status:  Discontinued     1,000 mg 200 mL/hr over 60 Minutes  Intravenous Every 12 hours 07/22/16 1957 07/25/16 1550   07/22/16 1800  ceFEPIme (MAXIPIME) 2 g in dextrose 5 % 50 mL IVPB  Status:  Discontinued     2 g 100 mL/hr over 30 Minutes Intravenous Every 12 hours 07/22/16 1702 07/25/16 1550   07/22/16 1715  vancomycin (VANCOCIN) 1,500 mg in sodium chloride 0.9 % 500 mL IVPB     1,500 mg 250 mL/hr over 120 Minutes Intravenous  Once 07/22/16 1706 07/22/16 2053         ROS:  as in HPI otherwise remainder of 12 point Review of Systems is negative as in HPI otherwise remainder of 12 point Review of Systems is not obtainable due to patient's confusion   Blood pressure (!) 131/53, pulse 76, temperature 98.2 F (36.8 C), temperature source Oral, resp. rate 16, height '5\' 7"'$  (1.702 m), weight 163 lb 12.8 oz (74.3 kg), SpO2 95 %. General: Alert and awake, oriented x3, not in any acute distress. HEENT: anicteric sclera,  EOMI, oropharynx clear and without exudate Cardiovascular: egular rate, normal r,  no murmur rubs or gallops Pulmonary: clear to auscultation bilaterally, no wheezing, rales or rhonchi Gastrointestinal: soft nontender, nondistended, normal bowel sounds, Musculoskeletal:  Heel ulcer 07/27/16:      Surgical site is bandaged   Neuro: nonfocal, strength and sensation intact   Results for orders placed or performed during the hospital encounter of 07/22/16 (from the past 48 hour(s))  Lipid panel     Status: Abnormal   Collection Time: 07/25/16 11:07 AM  Result Value Ref Range   Cholesterol 153 0 - 200 mg/dL   Triglycerides 97 <150 mg/dL   HDL 34 (L) >40 mg/dL   Total CHOL/HDL Ratio 4.5 RATIO   VLDL 19 0 - 40 mg/dL   LDL Cholesterol 100 (H) 0 - 99 mg/dL    Comment:        Total Cholesterol/HDL:CHD Risk Coronary Heart Disease Risk Table                     Men   Women  1/2 Average Risk   3.4   3.3  Average Risk       5.0   4.4  2 X Average Risk   9.6   7.1  3 X Average Risk  23.4   11.0        Use the calculated  Patient Ratio above and the CHD Risk  Table to determine the patient's CHD Risk.        ATP III CLASSIFICATION (LDL):  <100     mg/dL   Optimal  100-129  mg/dL   Near or Above                    Optimal  130-159  mg/dL   Borderline  160-189  mg/dL   High  >190     mg/dL   Very High   Glucose, capillary     Status: Abnormal   Collection Time: 07/25/16 12:33 PM  Result Value Ref Range   Glucose-Capillary 194 (H) 65 - 99 mg/dL  Glucose, capillary     Status: Abnormal   Collection Time: 07/25/16  4:02 PM  Result Value Ref Range   Glucose-Capillary 335 (H) 65 - 99 mg/dL  Surgical pcr screen     Status: None   Collection Time: 07/25/16  8:29 PM  Result Value Ref Range   MRSA, PCR NEGATIVE NEGATIVE   Staphylococcus aureus NEGATIVE NEGATIVE    Comment:        The Xpert SA Assay (FDA approved for NASAL specimens in patients over 57 years of age), is one component of a comprehensive surveillance program.  Test performance has been validated by [facility] for patients greater than or equal to 47 year old. It is not intended to diagnose infection nor to guide or monitor treatment.   Glucose, capillary     Status: Abnormal   Collection Time: 07/25/16  8:54 PM  Result Value Ref Range   Glucose-Capillary 101 (H) 65 - 99 mg/dL   Comment 1 Notify RN    Comment 2 Document in Chart   CBC     Status: Abnormal   Collection Time: 07/26/16  3:46 AM  Result Value Ref Range   WBC 12.2 (H) 4.0 - 10.5 K/uL   RBC 3.64 (L) 4.22 - 5.81 MIL/uL   Hemoglobin 11.1 (L) 13.0 - 17.0 g/dL   HCT 32.3 (L) 39.0 - 52.0 %   MCV 88.7 78.0 - 100.0 fL   MCH 30.5 26.0 - 34.0 pg   MCHC 34.4 30.0 - 36.0 g/dL   RDW 12.5 11.5 - 15.5 %   Platelets 418 (H) 150 - 400 K/uL  Basic metabolic panel     Status: Abnormal   Collection Time: 07/26/16  3:46 AM  Result Value Ref Range   Sodium 137 135 - 145 mmol/L   Potassium 4.2 3.5 - 5.1 mmol/L   Chloride 103 101 - 111 mmol/L   CO2 28 22 - 32 mmol/L   Glucose, Bld  122 (H) 65 - 99 mg/dL   BUN 20 6 - 20 mg/dL   Creatinine, Ser 1.04 0.61 - 1.24 mg/dL   Calcium 9.0 8.9 - 10.3 mg/dL   GFR calc non Af Amer >60 >60 mL/min   GFR calc Af Amer >60 >60 mL/min    Comment: (NOTE) The eGFR has been calculated using the CKD EPI equation. This calculation has not been validated in all clinical situations. eGFR's persistently <60 mL/min signify possible Chronic Kidney Disease.    Anion gap 6 5 - 15  Glucose, capillary     Status: Abnormal   Collection Time: 07/26/16  7:38 AM  Result Value Ref Range   Glucose-Capillary 146 (H) 65 - 99 mg/dL  Glucose, capillary     Status: Abnormal   Collection Time: 07/26/16  1:10 PM  Result Value Ref Range   Glucose-Capillary  248 (H) 65 - 99 mg/dL  Glucose, capillary     Status: Abnormal   Collection Time: 07/26/16  5:58 PM  Result Value Ref Range   Glucose-Capillary 333 (H) 65 - 99 mg/dL  Glucose, capillary     Status: Abnormal   Collection Time: 07/26/16 10:04 PM  Result Value Ref Range   Glucose-Capillary 180 (H) 65 - 99 mg/dL  CBC     Status: Abnormal   Collection Time: 07/27/16  2:04 AM  Result Value Ref Range   WBC 13.5 (H) 4.0 - 10.5 K/uL   RBC 3.37 (L) 4.22 - 5.81 MIL/uL   Hemoglobin 10.0 (L) 13.0 - 17.0 g/dL   HCT 61.6 (L) 07.6 - 06.6 %   MCV 89.9 78.0 - 100.0 fL   MCH 29.7 26.0 - 34.0 pg   MCHC 33.0 30.0 - 36.0 g/dL   RDW 78.5 54.7 - 68.9 %   Platelets 417 (H) 150 - 400 K/uL  Basic metabolic panel     Status: Abnormal   Collection Time: 07/27/16  2:04 AM  Result Value Ref Range   Sodium 135 135 - 145 mmol/L   Potassium 5.0 3.5 - 5.1 mmol/L   Chloride 98 (L) 101 - 111 mmol/L   CO2 24 22 - 32 mmol/L   Glucose, Bld 405 (H) 65 - 99 mg/dL   BUN 19 6 - 20 mg/dL   Creatinine, Ser 1.55 0.61 - 1.24 mg/dL   Calcium 8.8 (L) 8.9 - 10.3 mg/dL   GFR calc non Af Amer >60 >60 mL/min   GFR calc Af Amer >60 >60 mL/min    Comment: (NOTE) The eGFR has been calculated using the CKD EPI equation. This calculation  has not been validated in all clinical situations. eGFR's persistently <60 mL/min signify possible Chronic Kidney Disease.    Anion gap 13 5 - 15  Glucose, capillary     Status: Abnormal   Collection Time: 07/27/16  7:56 AM  Result Value Ref Range   Glucose-Capillary 483 (H) 65 - 99 mg/dL   Comment 1 Notify RN    @BRIEFLABTABLE (sdes,specrequest,cult,reptstatus)   ) Recent Results (from the past 720 hour(s))  MRSA PCR Screening     Status: None   Collection Time: 07/22/16  5:42 PM  Result Value Ref Range Status   MRSA by PCR NEGATIVE NEGATIVE Final    Comment:        The GeneXpert MRSA Assay (FDA approved for NASAL specimens only), is one component of a comprehensive MRSA colonization surveillance program. It is not intended to diagnose MRSA infection nor to guide or monitor treatment for MRSA infections.   Culture, blood (routine x 2)     Status: None (Preliminary result)   Collection Time: 07/23/16  4:10 PM  Result Value Ref Range Status   Specimen Description BLOOD RIGHT HAND  Final   Special Requests IN PEDIATRIC BOTTLE Blood Culture adequate volume  Final   Culture NO GROWTH 3 DAYS  Final   Report Status PENDING  Incomplete  Culture, blood (routine x 2)     Status: None (Preliminary result)   Collection Time: 07/23/16  4:10 PM  Result Value Ref Range Status   Specimen Description BLOOD LEFT HAND  Final   Special Requests IN PEDIATRIC BOTTLE Blood Culture adequate volume  Final   Culture NO GROWTH 3 DAYS  Final   Report Status PENDING  Incomplete  Surgical pcr screen     Status: None   Collection Time: 07/25/16  8:29 PM  Result Value Ref Range Status   MRSA, PCR NEGATIVE NEGATIVE Final   Staphylococcus aureus NEGATIVE NEGATIVE Final    Comment:        The Xpert SA Assay (FDA approved for NASAL specimens in patients over 15 years of age), is one component of a comprehensive surveillance program.  Test performance has been validated by [facility] for  patients greater than or equal to 11 year old. It is not intended to diagnose infection nor to guide or monitor treatment.      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 He claims to have had prior infection in back with MRSA and sepsis so I will ensure we cover for MRSA and Strep species  I will change him to  Bioavailable Zyvox 600 mg by mouth every 12 hours which will cover MRSA sensitive staph and strep. He'll also liberate him from vancomycin.  I would try to treat him for at least 3 weeks with oral Zyvox twice daily.  I can arrange follow-up for him in our clinic in the next 3-4 weeks.  He should see PCP and have cbc w diff checked 2 weeks into therapy  #2 ulcer of the heel: By CT not found to have osteomyelitis. He is high risk for this. This will need to be closely monitored. He does undergo another surgical intervention either here or at the toe site he'll be very helpful if cultures can be obtained to help me the liver rational antibiotics.  I will followup repeat Foot films but otherwise sign off for now please call with further questions.     07/27/2016, 10:47 AM   Thank you so much for this interesting  consult  Gene Autry for [place]  [phone] (pager) [phone] (office) 07/27/2016, 10:47 AM  Rhina Brackett [place] 07/27/2016, 10:47 AM

## 2016-07-27 NOTE — Progress Notes (Addendum)
Progress Note    07/27/2016 7:49 AM 1 Day Post-Op  Subjective:  Says he's hungry.  Says he saw his cats last night.  Afebrile HR 70's-80's NSR 650'P-546'F systolic 68% RA  Vitals:   07/26/16 2348 07/27/16 0329  BP: (!) 123/53 (!) 159/78  Pulse: 81 83  Resp: 16 16  Temp: 98.5 F (36.9 C) 98.5 F (36.9 C)    Physical Exam: Cardiac:  regular Lungs:  Non labored Incisions:  Right great toe amp site is clean; some maceration around amp site Extremities:  Brisk right AT doppler signal   CBC    Component Value Date/Time   WBC 13.5 (H) 07/27/2016 0204   RBC 3.37 (L) 07/27/2016 0204   HGB 10.0 (L) 07/27/2016 0204   HCT 30.3 (L) 07/27/2016 0204   PLT 417 (H) 07/27/2016 0204   MCV 89.9 07/27/2016 0204   MCH 29.7 07/27/2016 0204   MCHC 33.0 07/27/2016 0204   RDW 12.3 07/27/2016 0204   LYMPHSABS 2.5 07/22/2016 1809   MONOABS 0.9 07/22/2016 1809   EOSABS 0.2 07/22/2016 1809   BASOSABS 0.0 07/22/2016 1809    BMET    Component Value Date/Time   NA 135 07/27/2016 0204   K 5.0 07/27/2016 0204   CL 98 (L) 07/27/2016 0204   CO2 24 07/27/2016 0204   GLUCOSE 405 (H) 07/27/2016 0204   BUN 19 07/27/2016 0204   CREATININE 1.18 07/27/2016 0204   CALCIUM 8.8 (L) 07/27/2016 0204   GFRNONAA >60 07/27/2016 0204   GFRAA >60 07/27/2016 0204    INR    Component Value Date/Time   INR 1.00 07/25/2016 0240     Intake/Output Summary (Last 24 hours) at 07/27/16 0749 Last data filed at 07/27/16 0500  Gross per 24 hour  Intake             3350 ml  Output             2295 ml  Net             1055 ml     Assessment:  61 y.o. male is s/p:  Procedure Performed:             1.  Ultrasound-guided access, left femoral artery             2.  Right lower extremity runoff             3.  Angioplasty, right anterior tibial artery             4.  Angioplasty , right superficial femoral and popliteal artery             5.  Stent, right popliteal artery             6.  Amputation  of right great toe including metatarsal head             7.  Closure device             8. VAC placement to right great toe  1 Day Post-Op  Plan: -pt with brisk doppler signal right AT -wound vac off last night and placed wet to dry-removed this am and wound looks good this morning.  Will ask WOC to replace wound vac -post op shoe -DVT prophylaxis:  Lovenox -ok to transfer to [address] [place], [place] [facility] [phone] 07/27/2016 7:49 AM  I agree with the above.  I have seen and evaluated the patient.  He  is postoperative day #1 status post right great toe amputation as well as percutaneous revascularization of the right leg, including angioplasty to the right superficial femoral and popliteal and anterior tibial artery and stenting of the right popliteal artery.  His wound VAC was not functioning last night and therefore it was removed.  The wet-to-dry dressing change was performed today.  The great toe appears viable.  I will replace the wound VAC today.  He did have a foreign body in his heel which he states he removed last night.  We will check x-rays again this morning to verify this.  Continue IV antibiotics  [name] Fionnuala Hemmerich

## 2016-07-27 NOTE — Progress Notes (Signed)
Initial Nutrition Assessment  DOCUMENTATION CODES:   Not applicable  INTERVENTION:  Provide Glucerna Shake po BID, each supplement provides 220 kcal and 10 grams of protein.  Encourage adequate PO intake.   NUTRITION DIAGNOSIS:   Increased nutrient needs related to wound healing as evidenced by estimated needs.  GOAL:   Patient will meet greater than or equal to 90% of their needs  MONITOR:   PO intake, Supplement acceptance, Labs, Weight trends, Skin, I & O's  REASON FOR ASSESSMENT:   Consult Wound healing  ASSESSMENT:   61 y.o. male with a history of DM1 (uncontrolled), hep C, HTN, depression, drug abuse, tob use. He developed a great toe wound which worsened along with blackening of his heel wound.  CT with contrast which showed evidence of osteomyelitis in his toe.   Procedure (4/13):             1.  Ultrasound-guided access, left femoral artery             2.  Right lower extremity runoff             3.  Angioplasty, right anterior tibial artery             4.  Angioplasty , right superficial femoral and popliteal artery             5.  Stent, right popliteal artery             6.  Amputation of right great toe including metatarsal head             7.  Closure device             8. VAC placement to right great toe  RD consulted for wound healing. Meal completion has been 100%. Pt reports appetite is fine current and PTA with no other difficulties. Weight has been stable. Pt is agreeable to nutritional supplements to aid in healing. RD to order. Pt requested pain medication during time of visit. RN made aware.   Nutrition-Focused physical exam completed. Findings are no fat depletion, moderate muscle depletion, and mild edema.   Labs and medications reviewed. CBG 146-483 mg/dL.  Diet Order:  Diet heart healthy/carb modified Room service appropriate? Yes; Fluid consistency: Thin  Skin:  Wound (see comment) (Unstageable R heel, incision R foot)  Last BM:   Unknown  Height:   Ht Readings from Last 1 Encounters:  07/23/16 5\' 7"  (1.702 m)    Weight:   Wt Readings from Last 1 Encounters:  07/27/16 163 lb 12.8 oz (74.3 kg)    Ideal Body Weight:  67.2 kg  BMI:  Body mass index is 25.66 kg/m.  Estimated Nutritional Needs:   Kcal:  2000-2200  Protein:  95-110 grams  Fluid:  2- 2.2 L/day  EDUCATION NEEDS:   No education needs identified at this time  Corrin Parker, MS, RD, LDN Pager # [phone] After hours/ weekend pager # [phone]

## 2016-07-27 NOTE — Progress Notes (Signed)
Orthopedic Tech Progress Note Patient Details:  [name] 1955-10-09 [id number]  Ortho Devices Type of Ortho Device: Darco shoe Ortho Device/Splint Location: rle Ortho Device/Splint Interventions: Application   Adelma Bowdoin 07/27/2016, 8:42 AM

## 2016-07-27 NOTE — Progress Notes (Signed)
Pt supposed to have wound vac replaced by wound care nurse. [place] paged and unable to get a hold of anyone. Will continue to monitor wound and attempt to reach wound care.

## 2016-07-28 LAB — BASIC METABOLIC PANEL
Anion gap: 7 (ref 5–15)
BUN: 27 mg/dL — AB (ref 6–20)
CHLORIDE: 102 mmol/L (ref 101–111)
CO2: 29 mmol/L (ref 22–32)
CREATININE: 1.06 mg/dL (ref 0.61–1.24)
Calcium: 9 mg/dL (ref 8.9–10.3)
GFR calc Af Amer: >60 mL/min (ref >60)
GFR calc non Af Amer: >60 mL/min (ref >60)
Glucose, Bld: 139 mg/dL — ABNORMAL HIGH (ref 65–99)
POTASSIUM: 4.5 mmol/L (ref 3.5–5.1)
Sodium: 138 mmol/L (ref 135–145)

## 2016-07-28 LAB — CBC
HCT: 30.7 % — ABNORMAL LOW (ref 39.0–52.0)
HEMOGLOBIN: 10.3 g/dL — AB (ref 13.0–17.0)
MCH: 29.9 pg (ref 26.0–34.0)
MCHC: 33.6 g/dL (ref 30.0–36.0)
MCV: 89.2 fL (ref 78.0–100.0)
Platelets: 335 10*3/uL (ref 150–400)
RBC: 3.44 MIL/uL — AB (ref 4.22–5.81)
RDW: 12.3 % (ref 11.5–15.5)
WBC: 13.4 10*3/uL — ABNORMAL HIGH (ref 4.0–10.5)

## 2016-07-28 LAB — CULTURE, BLOOD (ROUTINE X 2)
Culture: NO GROWTH
Culture: NO GROWTH
Special Requests: ADEQUATE
Special Requests: ADEQUATE

## 2016-07-28 LAB — GLUCOSE, CAPILLARY
GLUCOSE-CAPILLARY: 189 mg/dL — AB (ref 65–99)
GLUCOSE-CAPILLARY: 83 mg/dL (ref 65–99)
Glucose-Capillary: 211 mg/dL — ABNORMAL HIGH (ref 65–99)
Glucose-Capillary: 227 mg/dL — ABNORMAL HIGH (ref 65–99)

## 2016-07-28 LAB — SEDIMENTATION RATE: Sed Rate: 122 mm/hr — ABNORMAL HIGH (ref 0–16)

## 2016-07-28 LAB — C-REACTIVE PROTEIN: CRP: 7.6 mg/dL — ABNORMAL HIGH (ref <1.0)

## 2016-07-28 LAB — PREALBUMIN: Prealbumin: 9.3 mg/dL — ABNORMAL LOW (ref 18–38)

## 2016-07-28 MED ORDER — INSULIN ASPART 100 UNIT/ML ~~LOC~~ SOLN
8.0000 [IU] | Freq: Three times a day (TID) | SUBCUTANEOUS | Status: DC
  Administered 2016-07-28 – 2016-07-30 (×8): 8 [IU] via SUBCUTANEOUS

## 2016-07-28 MED ORDER — ASPIRIN EC 81 MG PO TBEC
81.0000 mg | DELAYED_RELEASE_TABLET | Freq: Every day | ORAL | Status: DC
  Administered 2016-07-28 – 2016-07-30 (×3): 81 mg via ORAL
  Filled 2016-07-28 (×3): qty 1

## 2016-07-28 MED ORDER — CLOPIDOGREL BISULFATE 75 MG PO TABS
75.0000 mg | ORAL_TABLET | Freq: Every day | ORAL | Status: DC
  Administered 2016-07-28 – 2016-07-30 (×3): 75 mg via ORAL
  Filled 2016-07-28 (×3): qty 1

## 2016-07-28 MED ORDER — INSULIN DETEMIR 100 UNIT/ML ~~LOC~~ SOLN
20.0000 [IU] | Freq: Every day | SUBCUTANEOUS | Status: DC
  Administered 2016-07-28 – 2016-07-30 (×3): 20 [IU] via SUBCUTANEOUS
  Filled 2016-07-28 (×3): qty 0.2

## 2016-07-28 MED ORDER — NICOTINE 21 MG/24HR TD PT24
21.0000 mg | MEDICATED_PATCH | Freq: Every day | TRANSDERMAL | Status: DC
  Administered 2016-07-28 – 2016-07-30 (×3): 21 mg via TRANSDERMAL
  Filled 2016-07-28 (×3): qty 1

## 2016-07-28 NOTE — Progress Notes (Signed)
  Progress Note    07/28/2016 9:33 AM 2 Days Post-Op  Subjective:  Says he thinks he was in a hurry to get to town an didn't cap the needle.  Says the needle must've fallen in the floor and that is when he stepped on it.  Says post op shoe is uncomfortable.  Afebrile HR 867'Y-195'K systolic HR 93'O-67'T NSR (one episode of 42bpm at 2200)  Vitals:   07/28/16 0430 07/28/16 0741  BP: (!) 143/64 (!) 161/72  Pulse:  71  Resp: 11 16  Temp:  97.4 F (36.3 C)    Physical Exam: Incisions:  Dressing is clean and dry and in tact Extremities:  Right foot is warm and well perfused.   CBC    Component Value Date/Time   WBC 13.4 (H) 07/28/2016 0302   RBC 3.44 (L) 07/28/2016 0302   HGB 10.3 (L) 07/28/2016 0302   HCT 30.7 (L) 07/28/2016 0302   PLT 335 07/28/2016 0302   MCV 89.2 07/28/2016 0302   MCH 29.9 07/28/2016 0302   MCHC 33.6 07/28/2016 0302   RDW 12.3 07/28/2016 0302   LYMPHSABS 2.5 07/22/2016 1809   MONOABS 0.9 07/22/2016 1809   EOSABS 0.2 07/22/2016 1809   BASOSABS 0.0 07/22/2016 1809    BMET    Component Value Date/Time   NA 138 07/28/2016 0302   K 4.5 07/28/2016 0302   CL 102 07/28/2016 0302   CO2 29 07/28/2016 0302   GLUCOSE 139 (H) 07/28/2016 0302   BUN 27 (H) 07/28/2016 0302   CREATININE 1.06 07/28/2016 0302   CALCIUM 9.0 07/28/2016 0302   GFRNONAA >60 07/28/2016 0302   GFRAA >60 07/28/2016 0302    INR    Component Value Date/Time   INR 1.00 07/25/2016 0240     Intake/Output Summary (Last 24 hours) at 07/28/16 0933 Last data filed at 07/28/16 0744  Gross per 24 hour  Intake                0 ml  Output              300 ml  Net             -300 ml     Assessment:  61 y.o. male is s/p:  Procedure Performed: 1. Ultrasound-guided access, left femoral artery 2. Right lower extremity runoff 3. Angioplasty, right anterior tibial artery 4. Angioplasty , right superficial femoral and popliteal  artery 5. Stent, right popliteal artery 6. Amputation of right great toe including metatarsal head 7. Closure device 8. VAC placement to right great toe   2 Days Post-Op  Plan: -pt's right foot is warm -foot xray reviewed-Given location of small metallic object (most likely DM needle), Dr. Trula Slade does not feel the risks outweigh benefit as to create another wound.   -leukocytosis stable and pt is afebrile -for wound vac to right great toe-until then, wet to dry saline dressing changes bid. -continue post op shoe -DVT prophylaxis:  Lovenox -ok from vascular point to transfer   Leontine Locket, PA-C [facility] [phone] 07/28/2016 9:33 AM  Agree with the above.  I have seen and evaluated the patient.  His wound VAC will be replaced to the right great toe.  Continue IV antibiotics.  We'll start the patient on aspirin and Plavix.  X-rays performed yesterday show retained foreign body.  This is in the midportion of the foot.  I would not recommend wound exploration at this time.  Annamarie Major

## 2016-07-28 NOTE — Progress Notes (Signed)
Patient in room [phone] with overhead camera.  Patient made aware that he is on camera at all times.

## 2016-07-28 NOTE — Progress Notes (Signed)
PROGRESS NOTE    [name]  SFK:[id number] DOB: 03-30-1956 DOA: 07/22/2016 PCP: No primary care provider on file.   No chief complaint on file.    Brief Narrative:  Pt admitted on 07/22/16 directly from [name]. Evette [name] office. Accepted pt to [facility] service on 07/23/16 after discussing pt condition w/ [name].  As noted from his H&P [name] a 60 y.o.malewith a history of DM1 (uncontrolled), hep C, HTN, depression, drug abuse, tob use. He smoked crack last 3 days ago. He has been having problems with his R great toe for over a month. It started after he was hospitalized with DKA and SIRS in January 2018. He had a R heel ulcer in January and was on antibiotics. He saw [name] 02/05 and was referred to a a wound specialist for the heel wound. He then developed the right great toe wound. The right heel wound is blackened but not draining and not eating larger. The right great toe wound has been worsening steadily. It has grown more swollen, or reddened, started blackening and his foot has become swollen. In the last 3 or 4 days, he has developed chills and subjective fevers. He has general malaise and generally feels very bad. He is unable to walk on the foot. He has a supportive shoe and a walker that he uses to get around. He is not having any cardiac symptoms. Specifically, he is not having any chest pain, no new dyspnea on exertion and shortness of breath. He denies orthopnea or PND. He is not having palpitations. He is having weakness and some dizziness. He has not been checking his blood sugars. He has been smoking marijuana which helps with the pain.  Assessment & Plan   R toe gangrene and cellulitis/Peripheral vascular disease. -Pulses limited on exam. h/o PAD -Started on broad-spectrum antibiotics vancomycin and cefepime on admission, currently discontinued as per discussion with infectious disease. -Vascular surgery and orthopedics consulted and appreciated -Underwent angiogram of  the right leg on 07/25/2016. Followed by amputation of great toe on the right as well as ingestion of the popliteal stent with balloon angioplasty on the right. -Infectious disease consulted and appreciated -Cannot get an MRI due to foreign body, received CT scan with contrast which is positive for osteomyelitis.  -ABI positive, angiogram shows blockages and SFA as well as from popliteal artery. Bilaterally. -BCX (ABX started 24hrs prior to obtaining unfortunately) no growth -Given history of drug abuse patient is at high risk for substance abuse with medication. -Reviewed [place] controlled substance access, no high-risk prescriptions in last 6 months. -Initially started on scheduled OxyContin, change to Norco 6 hours as needed, placing the patient on scheduled Robaxin and increasing the dose of gabapentin. Continue Toradol. -Repeat foot xray on 07/27/16 shows: small linear bent metallic foreign body in the soft tissues plantar to the calcaneous (retained needle fragment)- Vascular feels no wound exploration needed at this time. -Plan for wound vac to be placed -ID placing patient on Zyvox BID, for at least 3 weeks with outpatient follow up in 3-4 weeks.   Diabetes mellitus -last A1c 8.8 -Continue levemir, will increase today to 20units -continue ISS and CBG monitoring  -Added 8u TID WC -diabetes coordinator cosulted  PAD -previous studies in 2015 showing stenotic and occlusive disease bilaterally in LE.  -S/P popliteal stent implant with balloon angioplasty.  Peripheral neuropathy -Continue Neurontin increased to 200 3 times a day  Hypertension -possible exacerbated due to pain- currently stable  -Hydralazine  PRN -conisder initiating long term therapy prior to DC if remains elevated.   Chronic Hepatitis -unsure of last appt.  -HCV RNA quant 45,300 -ID consulted and appreciated  Tobacco/cocaine abuse -40+pack year smoker. Endorses recent THC and cocaine use.    -NIcotine patch -UDS positive for cocaine, benzodiazepine, cannabis.  DVT Prophylaxis  Lovenox  Code Status: Full code  Family Communication: None at bedside  Disposition Plan: Admitted. Will transfer to tele  Consultants Vascular surgery Orthopedic surgery  Procedures  S/p amputation of right great toe   Antibiotics   Anti-infectives    Start     Dose/Rate Route Frequency Ordered Stop   07/27/16 1000  linezolid (ZYVOX) tablet 600 mg     600 mg Oral Every 12 hours 07/27/16 0909     07/26/16 0845  vancomycin (VANCOCIN) IVPB 1000 mg/200 mL premix     1,000 mg 200 mL/hr over 60 Minutes Intravenous To ShortStay Surgical 07/26/16 0842 07/26/16 1027   07/23/16 1000  vancomycin (VANCOCIN) IVPB 1000 mg/200 mL premix  Status:  Discontinued     1,000 mg 200 mL/hr over 60 Minutes Intravenous Every 12 hours 07/22/16 1957 07/25/16 1550   07/22/16 1800  ceFEPIme (MAXIPIME) 2 g in dextrose 5 % 50 mL IVPB  Status:  Discontinued     2 g 100 mL/hr over 30 Minutes Intravenous Every 12 hours 07/22/16 1702 07/25/16 1550   07/22/16 1715  vancomycin (VANCOCIN) 1,500 mg in sodium chloride 0.9 % 500 mL IVPB     1,500 mg 250 mL/hr over 120 Minutes Intravenous  Once 07/22/16 1706 07/22/16 2053      Subjective:   [name] seen and examined today. Continues to complain of pain. Does not understand how he got a needle in his foot, feels he may have stepped on it at home.  Denies chest pain, shortness of breath, abdominal pain.   Objective:   Vitals:   07/27/16 2346 07/28/16 0427 07/28/16 0430 07/28/16 0741  BP: 133/60 (!) 143/64 (!) 143/64 (!) 161/72  Pulse: 78 70  71  Resp: 11 11 11 16   Temp: 98.7 F (37.1 C) 98.5 F (36.9 C)  97.4 F (36.3 C)  TempSrc: Oral Oral  Oral  SpO2: 96% 97%  100%  Weight:  71.4 kg (157 lb 6.5 oz)    Height:        Intake/Output Summary (Last 24 hours) at 07/28/16 1116 Last data filed at 07/28/16 0744  Gross per 24 hour  Intake                0 ml   Output              300 ml  Net             -300 ml   Filed Weights   07/26/16 1714 07/27/16 0500 07/28/16 0427  Weight: 74 kg (163 lb 2.3 oz) 74.3 kg (163 lb 12.8 oz) 71.4 kg (157 lb 6.5 oz)    Exam  General: Well developed, well nourished, NAD, appears stated age  HEENT: NCAT,  mucous membranes moist.   Cardiovascular: S1 S2 auscultated, RRR, no murmurs  Respiratory: Clear to auscultation bilaterally with equal chest rise  Abdomen: Soft, nontender, nondistended, + bowel sounds  Extremities: warm dry without cyanosis clubbing or edema. Right foot in post op shoe  Neuro: AAOx3, nonfoal  Psych: Anxious     Data Reviewed: I have personally reviewed following labs and imaging studies  CBC:  Recent Labs  Lab 07/22/16 1809  07/24/16 0329 07/25/16 0240 07/26/16 0346 07/27/16 0204 07/28/16 0302  WBC 17.5*  < > 16.0* 13.1* 12.2* 13.5* 13.4*  NEUTROABS 13.9*  --   --   --   --   --   --   HGB 12.0*  < > 12.3* 10.9* 11.1* 10.0* 10.3*  HCT 34.4*  < > 36.3* 32.7* 32.3* 30.3* 30.7*  MCV 88.4  < > 89.4 89.3 88.7 89.9 89.2  PLT 339  < > 397 389 418* 417* 335  < > = values in this interval not displayed. Basic Metabolic Panel:  Recent Labs Lab 07/24/16 0329 07/25/16 0240 07/26/16 0346 07/27/16 0204 07/28/16 0302  NA 134* 135 137 135 138  K 4.2 4.0 4.2 5.0 4.5  CL 99* 101 103 98* 102  CO2 27 27 28 24 29   GLUCOSE 147* 86 122* 405* 139*  BUN 9 11 20 19  27*  CREATININE 0.99 0.98 1.04 1.18 1.06  CALCIUM 9.2 8.9 9.0 8.8* 9.0   GFR: Estimated Creatinine Clearance: 70.2 mL/min (by C-G formula based on SCr of 1.06 mg/dL). Liver Function Tests:  Recent Labs Lab 07/22/16 1809 07/24/16 0329  AST 30 25  ALT 23 23  ALKPHOS 77 71  BILITOT 0.2* 0.5  PROT 6.6 6.6  ALBUMIN 2.5* 2.4*   No results for input(s): LIPASE, AMYLASE in the last 168 hours. No results for input(s): AMMONIA in the last 168 hours. Coagulation Profile:  Recent Labs Lab 07/25/16 0240  INR 1.00    Cardiac Enzymes: No results for input(s): CKTOTAL, CKMB, CKMBINDEX, TROPONINI in the last 168 hours. BNP (last 3 results) No results for input(s): PROBNP in the last 8760 hours. HbA1C: No results for input(s): HGBA1C in the last 72 hours. CBG:  Recent Labs Lab 07/27/16 1145 07/27/16 1354 07/27/16 1708 07/27/16 2114 07/28/16 0742  GLUCAP 462* 360* 296* 99 189*   Lipid Profile: No results for input(s): CHOL, HDL, LDLCALC, TRIG, CHOLHDL, LDLDIRECT in the last 72 hours. Thyroid Function Tests: No results for input(s): TSH, T4TOTAL, FREET4, T3FREE, THYROIDAB in the last 72 hours. Anemia Panel: No results for input(s): VITAMINB12, FOLATE, FERRITIN, TIBC, IRON, RETICCTPCT in the last 72 hours. Urine analysis:    Component Value Date/Time   COLORURINE STRAW (A) 04/18/2016 1333   APPEARANCEUR CLEAR 04/18/2016 1333   LABSPEC 1.020 04/18/2016 1333   PHURINE 5.0 04/18/2016 1333   GLUCOSEU >=500 (A) 04/18/2016 1333   GLUCOSEU 500 04/28/2012 1149   HGBUR SMALL (A) 04/18/2016 1333   BILIRUBINUR NEGATIVE 04/18/2016 1333   BILIRUBINUR neg 04/28/2012 1053   KETONESUR 20 (A) 04/18/2016 1333   PROTEINUR 100 (A) 04/18/2016 1333   UROBILINOGEN 4.0 (H) 11/16/2013 1646   NITRITE NEGATIVE 04/18/2016 1333   LEUKOCYTESUR NEGATIVE 04/18/2016 1333   Sepsis Labs: @LABRCNTIP (procalcitonin:4,lacticidven:4)  ) Recent Results (from the past 240 hour(s))  MRSA PCR Screening     Status: None   Collection Time: 07/22/16  5:42 PM  Result Value Ref Range Status   MRSA by PCR NEGATIVE NEGATIVE Final    Comment:        The GeneXpert MRSA Assay (FDA approved for NASAL specimens only), is one component of a comprehensive MRSA colonization surveillance program. It is not intended to diagnose MRSA infection nor to guide or monitor treatment for MRSA infections.   Culture, blood (routine x 2)     Status: None (Preliminary result)   Collection Time: 07/23/16  4:10 PM  Result Value Ref Range  Status  Specimen Description BLOOD RIGHT HAND  Final   Special Requests IN PEDIATRIC BOTTLE Blood Culture adequate volume  Final   Culture NO GROWTH 4 DAYS  Final   Report Status PENDING  Incomplete  Culture, blood (routine x 2)     Status: None (Preliminary result)   Collection Time: 07/23/16  4:10 PM  Result Value Ref Range Status   Specimen Description BLOOD LEFT HAND  Final   Special Requests IN PEDIATRIC BOTTLE Blood Culture adequate volume  Final   Culture NO GROWTH 4 DAYS  Final   Report Status PENDING  Incomplete  Surgical pcr screen     Status: None   Collection Time: 07/25/16  8:29 PM  Result Value Ref Range Status   MRSA, PCR NEGATIVE NEGATIVE Final   Staphylococcus aureus NEGATIVE NEGATIVE Final    Comment:        The Xpert SA Assay (FDA approved for NASAL specimens in patients over 65 years of age), is one component of a comprehensive surveillance program.  Test performance has been validated by [facility] for patients greater than or equal to 108 year old. It is not intended to diagnose infection nor to guide or monitor treatment.       Radiology Studies: Dg Foot Complete Right  Result Date: 07/27/2016 CLINICAL DATA:  60 year old male with history of great toe amputation yesterday. Evaluate for foreign body in the soft tissues of the right foot. EXAM: RIGHT FOOT COMPLETE - 3+ VIEW COMPARISON:  Right foot radiograph 06/07/2016. FINDINGS: Postoperative changes of recent amputation of the first digit beyond on the first metatarsal is noted. The medial aspect of the articular surface of the first metatarsal appears to have been resected as well. A small fragment of the lateral aspect of the base of the first proximal phalanx remains. Overlying soft tissues are irregular, with surgical dressing in place. The small bent linear metallic foreign body seen on the prior study in the soft tissues along the plantar aspect of the calcaneus is again noted and unchanged. Numerous  vascular calcifications are noted. IMPRESSION: 1. Small linear bent metallic foreign body in the soft tissues plantar to the calcaneus is unchanged, presumably a retained fragment of a needle. 2. Postoperative changes related to recent first digit amputation, as above. 3. Aortic atherosclerosis. Electronically Signed   By: Vinnie Langton M.D.   On: 07/27/2016 11:04     Scheduled Meds: . aspirin EC  81 mg Oral Daily  . clopidogrel  75 mg Oral Daily  . docusate sodium  100 mg Oral Daily  . enoxaparin (LOVENOX) injection  40 mg Subcutaneous Q24H  . feeding supplement (GLUCERNA SHAKE)  237 mL Oral BID BM  . folic acid  1 mg Oral Daily  . gabapentin  200 mg Oral TID  . insulin aspart  0-20 Units Subcutaneous TID WC  . insulin aspart  0-5 Units Subcutaneous QHS  . insulin aspart  8 Units Subcutaneous TID WC  . insulin detemir  20 Units Subcutaneous Daily  . linezolid  600 mg Oral Q12H  . methocarbamol  500 mg Oral TID  . multivitamin with minerals  1 tablet Oral Daily  . [START ON 07/29/2016] nicotine  21 mg Transdermal Daily  . thiamine  100 mg Oral Daily   Continuous Infusions: . sodium chloride Stopped (07/27/16 0500)  . lactated ringers 10 mL/hr at 07/26/16 0855     LOS: 6 days   Time Spent in minutes   30 minutes  Coleton Woon  D.O. on 07/28/2016 at 11:16 AM  Between 7am to 7pm - Pager - [phone]  After 7pm go to [email or web address] - password [email or web address]  And look for the night coverage person covering for me after hours  [facility] Office  [phone]

## 2016-07-28 NOTE — Progress Notes (Signed)
Pt verbalized being upset with pain control. MD paged and notified. Pt verbally abusive and states "if I want more drugs I can get more drugs" and "I have people coming to see me". Pt also states son is a Animator". There is concern with pt having access to drugs while in the hospital. The pt has been counseled and educated and that he may not take any recreational drugs or medications that are not prescribed and given to him by nursing staff while he is in the hospital. Pt stated "you'll never know" at the end of conversation.

## 2016-07-28 NOTE — Progress Notes (Signed)
Pt transferred to 6E15 via wheel chair with belongings in lap, wallet in black bag in patient belonging bag, and visitors along side him. Vital signs stable. No signs of distress. Wound vac to R Great toe amputation.   Temp: 98 F (36.7 C) (04/15 1220) Temp Source: Oral (04/15 1220) BP: 162/70 (04/15 1220) Pulse Rate: 74 (04/15 1220)  Wray Kearns 07/28/2016 1:14 PM

## 2016-07-28 NOTE — Consult Note (Addendum)
[facility] Nurse wound consult note Reason for Consult:wound vac NWPT Wound type:surgical R great toe amp Pressure Injury POA: N/A Measurement: 4cm x 3cm x 2.5cm, filet open, hard to measure accurately Wound bed: 100% red granulating wound bed Drainage (amount, consistency, odor) scant, no odor Periwound:reddened on dorsal foot Measurement Right heel, 4cm x 2cm x 0.2cm, dark gray bed, calloused borders, no drainage or odor, pt states contemplating surgical procedure to remove needle from heel.  Dressing procedure/placement/frequency:I have placed the wound vac today, using one piece of black foam. I changed the schedule to  Cain Saupe, Sat for bedside RN to perform. I have also provided nurses with orders for Right Heel: To right heel, perform daily, cleanse with NS, pat dry, cover wound with vaseline gauze, cover with dry gauze, wrap with kerlix, re-apply boot. We will not follow, but will remain available to this patient, to nursing, and the medical and/or surgical teams.  Please re-consult if we need to assist further.    Fara Olden, RN-C, WTA-C Wound Treatment Associate

## 2016-07-28 NOTE — Progress Notes (Signed)
Inpatient Diabetes Program Recommendations  AACE/ADA: New Consensus Statement on Inpatient Glycemic Control (2015)  Target Ranges:  Prepandial:   less than 140 mg/dL      Peak postprandial:   less than 180 mg/dL (1-2 hours)      Critically ill patients:  140 - 180 mg/dL   Lab Results  Component Value Date   GLUCAP 99 07/27/2016   HGBA1C 8.8 05/20/2016    Review of Glycemic Control Results for TREON, KEHL (MRN [id number]) as of 07/28/2016 07:41  Ref. Range 07/27/2016 07:56 07/27/2016 11:45 07/27/2016 13:54 07/27/2016 17:08 07/27/2016 21:14  Glucose-Capillary Latest Ref Range: 65 - 99 mg/dL 483 (H) 462 (H) 360 (H) 296 (H) 99    Recommendations: Decrease Novolog to 8 units tidwc for meal coverage insulin Decrease Novolog to 0-15 units tidwc and hs, since pt is Type 1.  Thank you, Nani Gasser. Jasmeet Manton, RN, MSN, CDE  Diabetes Coordinator Inpatient Glycemic Control Team Team Pager [phone] (8am-5pm) 07/28/2016 7:47 AM

## 2016-07-29 ENCOUNTER — Telehealth: Payer: Self-pay | Admitting: Endocrinology

## 2016-07-29 DIAGNOSIS — E11628 Type 2 diabetes mellitus with other skin complications: Secondary | ICD-10-CM

## 2016-07-29 LAB — BASIC METABOLIC PANEL
ANION GAP: 10 (ref 5–15)
BUN: 18 mg/dL (ref 6–20)
CALCIUM: 8.9 mg/dL (ref 8.9–10.3)
CO2: 26 mmol/L (ref 22–32)
CREATININE: 0.86 mg/dL (ref 0.61–1.24)
Chloride: 101 mmol/L (ref 101–111)
GLUCOSE: 84 mg/dL (ref 65–99)
Potassium: 4.4 mmol/L (ref 3.5–5.1)
Sodium: 137 mmol/L (ref 135–145)

## 2016-07-29 LAB — GLUCOSE, CAPILLARY
GLUCOSE-CAPILLARY: 93 mg/dL (ref 65–99)
Glucose-Capillary: 156 mg/dL — ABNORMAL HIGH (ref 65–99)
Glucose-Capillary: 130 mg/dL — ABNORMAL HIGH (ref 65–99)
Glucose-Capillary: 94 mg/dL (ref 65–99)

## 2016-07-29 LAB — CBC
HCT: 31.3 % — ABNORMAL LOW (ref 39.0–52.0)
Hemoglobin: 10.5 g/dL — ABNORMAL LOW (ref 13.0–17.0)
MCH: 29.9 pg (ref 26.0–34.0)
MCHC: 33.5 g/dL (ref 30.0–36.0)
MCV: 89.2 fL (ref 78.0–100.0)
PLATELETS: 419 10*3/uL — AB (ref 150–400)
RBC: 3.51 MIL/uL — ABNORMAL LOW (ref 4.22–5.81)
RDW: 12.2 % (ref 11.5–15.5)
WBC: 9.6 10*3/uL (ref 4.0–10.5)

## 2016-07-29 LAB — HEMOGLOBIN A1C
HEMOGLOBIN A1C: 7.9 % — AB (ref 4.8–5.6)
MEAN PLASMA GLUCOSE: 180 mg/dL

## 2016-07-29 MED ORDER — GABAPENTIN 300 MG PO CAPS
300.0000 mg | ORAL_CAPSULE | Freq: Three times a day (TID) | ORAL | Status: DC
  Administered 2016-07-29 – 2016-07-30 (×5): 300 mg via ORAL
  Filled 2016-07-29 (×5): qty 1

## 2016-07-29 MED ORDER — CLOPIDOGREL BISULFATE 75 MG PO TABS: 75.0000 mg | ORAL_TABLET | Freq: Every day | ORAL | 0 refills | Status: DC

## 2016-07-29 MED ORDER — GABAPENTIN 300 MG PO CAPS: 300.0000 mg | ORAL_CAPSULE | Freq: Three times a day (TID) | ORAL | 0 refills | Status: DC

## 2016-07-29 MED ORDER — ASPIRIN 81 MG PO TBEC: 81.0000 mg | DELAYED_RELEASE_TABLET | Freq: Every day | ORAL | 0 refills | Status: DC

## 2016-07-29 MED ORDER — GLUCERNA SHAKE PO LIQD: 237.0000 mL | Freq: Two times a day (BID) | ORAL | 0 refills | Status: DC

## 2016-07-29 MED ORDER — LINEZOLID 600 MG PO TABS: 600.0000 mg | ORAL_TABLET | Freq: Two times a day (BID) | ORAL | 0 refills | Status: AC

## 2016-07-29 MED ORDER — METHOCARBAMOL 500 MG PO TABS: 500.0000 mg | ORAL_TABLET | Freq: Three times a day (TID) | ORAL | 0 refills | Status: DC | PRN

## 2016-07-29 MED ORDER — GUAIFENESIN-DM 100-10 MG/5ML PO SYRP: 15.0000 mL | ORAL_SOLUTION | ORAL | 0 refills | Status: DC | PRN

## 2016-07-29 MED ORDER — NICOTINE 21 MG/24HR TD PT24: 21.0000 mg | MEDICATED_PATCH | Freq: Every day | TRANSDERMAL | 0 refills | Status: DC

## 2016-07-29 MED ORDER — FOLIC ACID 1 MG PO TABS: 1.0000 mg | ORAL_TABLET | Freq: Every day | ORAL | 0 refills | Status: DC

## 2016-07-29 MED ORDER — THIAMINE HCL 100 MG PO TABS: 100.0000 mg | ORAL_TABLET | Freq: Every day | ORAL | 0 refills | Status: DC

## 2016-07-29 MED ORDER — HYDROCODONE-ACETAMINOPHEN 5-325 MG PO TABS: 1.0000 | ORAL_TABLET | Freq: Four times a day (QID) | ORAL | 0 refills | Status: DC | PRN

## 2016-07-29 MED ORDER — DOCUSATE SODIUM 100 MG PO CAPS: 100.0000 mg | ORAL_CAPSULE | Freq: Every day | ORAL | 0 refills | Status: DC

## 2016-07-29 NOTE — Progress Notes (Signed)
Subjective  - POD #3  Has pain in right foot   Physical Exam:  Wound vac in place to great toe amputation site Heel eschar unchanged Decreased erythema       Assessment/Plan:  POD #3  Patient would benefit from discussions with social owrk / care management, as I am afraid he is not going to be able to afford any of his medicines.  He wasts to try something for his neuropathy.  Neurontin has worked in the past but he can't afford it.  I am also concerned he wont be able to get Plavix.  He plans to go stay with an old roommate.  I don't think this is ideal given what the has told me about this person.  He is stable from my perspective.  D/C planning in place.  Surgically speaking from my perspective he is cleared for d/c, but there are still medical issues we are dealing with  Caressa Scearce, [name] 07/29/2016 9:06 AM --  Vitals:   07/28/16 2038 07/29/16 0642  BP: (!) 165/73 (!) 160/81  Pulse: 76 74  Resp: 17 17  Temp: 98.3 F (36.8 C) 97.8 F (36.6 C)    Intake/Output Summary (Last 24 hours) at 07/29/16 0906 Last data filed at 07/29/16 0846  Gross per 24 hour  Intake              700 ml  Output              550 ml  Net              150 ml     Laboratory CBC    Component Value Date/Time   WBC 9.6 07/29/2016 0504   HGB 10.5 (L) 07/29/2016 0504   HCT 31.3 (L) 07/29/2016 0504   PLT 419 (H) 07/29/2016 0504    BMET    Component Value Date/Time   NA 137 07/29/2016 0504   K 4.4 07/29/2016 0504   CL 101 07/29/2016 0504   CO2 26 07/29/2016 0504   GLUCOSE 84 07/29/2016 0504   BUN 18 07/29/2016 0504   CREATININE 0.86 07/29/2016 0504   CALCIUM 8.9 07/29/2016 0504   GFRNONAA >60 07/29/2016 0504   GFRAA >60 07/29/2016 0504    COAG Lab Results  Component Value Date   INR 1.00 07/25/2016   No results found for: PTT  Antibiotics Anti-infectives    Start     Dose/Rate Route Frequency Ordered Stop   07/27/16 1000  linezolid (ZYVOX) tablet 600 mg     600 mg  Oral Every 12 hours 07/27/16 0909     07/26/16 0845  vancomycin (VANCOCIN) IVPB 1000 mg/200 mL premix     1,000 mg 200 mL/hr over 60 Minutes Intravenous To ShortStay Surgical 07/26/16 0842 07/26/16 1027   07/23/16 1000  vancomycin (VANCOCIN) IVPB 1000 mg/200 mL premix  Status:  Discontinued     1,000 mg 200 mL/hr over 60 Minutes Intravenous Every 12 hours 07/22/16 1957 07/25/16 1550   07/22/16 1800  ceFEPIme (MAXIPIME) 2 g in dextrose 5 % 50 mL IVPB  Status:  Discontinued     2 g 100 mL/hr over 30 Minutes Intravenous Every 12 hours 07/22/16 1702 07/25/16 1550   07/22/16 1715  vancomycin (VANCOCIN) 1,500 mg in sodium chloride 0.9 % 500 mL IVPB     1,500 mg 250 mL/hr over 120 Minutes Intravenous  Once 07/22/16 1706 07/22/16 2053       V. Leia Alf, M.D. [facility] Office: [phone] Pager:  [phone]

## 2016-07-29 NOTE — Telephone Encounter (Signed)
Had toe amputated  He is in the hospital now

## 2016-07-29 NOTE — Progress Notes (Signed)
PROGRESS NOTE    MACKAY HANAUER  RCV:[id number] DOB: Sep 10, 1955 DOA: 07/22/2016 PCP: No primary care provider on file.   No chief complaint on file.    Brief Narrative:  Pt admitted on 07/22/16 directly from Dr. Evette Georges office. Accepted pt to [facility] service on 07/23/16 after discussing pt condition w/ Dr. Radford Pax.  As noted from his H&P ZEN CEDILLOS a 61 y.o.malewith a history of DM1 (uncontrolled), hep C, HTN, depression, drug abuse, tob use. He smoked crack last 3 days ago. He has been having problems with his R great toe for over a month. It started after he was hospitalized with DKA and SIRS in January 2018. He had a R heel ulcer in January and was on antibiotics. He saw Dr Loanne Drilling 02/05 and was referred to a a wound specialist for the heel wound. He then developed the right great toe wound. The right heel wound is blackened but not draining and not eating larger. The right great toe wound has been worsening steadily. It has grown more swollen, or reddened, started blackening and his foot has become swollen. In the last 3 or 4 days, he has developed chills and subjective fevers. He has general malaise and generally feels very bad. He is unable to walk on the foot. He has a supportive shoe and a walker that he uses to get around. He is not having any cardiac symptoms. Specifically, he is not having any chest pain, no new dyspnea on exertion and shortness of breath. He denies orthopnea or PND. He is not having palpitations. He is having weakness and some dizziness. He has not been checking his blood sugars. He has been smoking marijuana which helps with the pain.  Assessment & Plan   R toe gangrene and cellulitis/Peripheral vascular disease. -Pulses limited on exam. h/o PAD -Started on broad-spectrum antibiotics vancomycin and cefepime on admission, currently discontinued as per discussion with infectious disease. -Vascular surgery and orthopedics consulted and appreciated -Underwent angiogram of  the right leg on 07/25/2016. Followed by amputation of great toe on the right as well as ingestion of the popliteal stent with balloon angioplasty on the right. -Infectious disease consulted and appreciated -Cannot get an MRI due to foreign body, received CT scan with contrast which is positive for osteomyelitis.  -ABI positive, angiogram shows blockages and SFA as well as from popliteal artery. Bilaterally. -BCX (ABX started 24hrs prior to obtaining unfortunately) no growth -Given history of drug abuse patient is at high risk for substance abuse with medication. -Reviewed [place] controlled substance access, no high-risk prescriptions in last 6 months. -Initially started on scheduled OxyContin, change to Norco 6 hours as needed, placing the patient on scheduled Robaxin and increasing the dose of gabapentin. Continue Toradol. -Repeat foot xray on 07/27/16 shows: small linear bent metallic foreign body in the soft tissues plantar to the calcaneous (retained needle fragment)- Vascular feels no wound exploration needed at this time. -Plan for wound vac to be placed for 6 weeks -ID placing patient on Zyvox BID, for at least 3 weeks with outpatient follow up in 3-4 weeks.  Awaiting case manager assistance as well as Education officer, museum assistance for discharge. Ideally given home situation SNF would be appropriate discharge with wound VAC.  Diabetes mellitus -last A1c 8.8 -Continue levemir, will increase today to 20units -continue ISS and CBG monitoring  -Added 8u TID WC -diabetes coordinator cosulted  PAD -previous studies in 2015 showing stenotic and occlusive disease bilaterally in LE.  -S/P popliteal stent  implant with balloon angioplasty.  Peripheral neuropathy -Continue Neurontin increased to 300 3 times a day  Hypertension -possible exacerbated due to pain- currently stable  -Hydralazine PRN -conisder initiating long term therapy prior to DC if remains elevated.   Chronic  Hepatitis -unsure of last appt.  -HCV RNA quant 45,300 -ID consulted and appreciated  Tobacco/cocaine abuse -40+pack year smoker. Endorses recent THC and cocaine use.  -NIcotine patch -UDS positive for cocaine, benzodiazepine, cannabis.  DVT Prophylaxis  Lovenox  Code Status: Full code  Family Communication: None at bedside  Disposition Plan: Admitted. Awaiting social worker and case manager consult for discharge arrangement.  Consultants Vascular surgery Orthopedic surgery  Procedures  S/p amputation of right great toe   Antibiotics   Anti-infectives    Start     Dose/Rate Route Frequency Ordered Stop   07/29/16 0000  linezolid (ZYVOX) 600 MG tablet     600 mg Oral Every 12 hours 07/29/16 1129 08/16/16 2359   07/27/16 1000  linezolid (ZYVOX) tablet 600 mg     600 mg Oral Every 12 hours 07/27/16 0909     07/26/16 0845  vancomycin (VANCOCIN) IVPB 1000 mg/200 mL premix     1,000 mg 200 mL/hr over 60 Minutes Intravenous To ShortStay Surgical 07/26/16 0842 07/26/16 1027   07/23/16 1000  vancomycin (VANCOCIN) IVPB 1000 mg/200 mL premix  Status:  Discontinued     1,000 mg 200 mL/hr over 60 Minutes Intravenous Every 12 hours 07/22/16 1957 07/25/16 1550   07/22/16 1800  ceFEPIme (MAXIPIME) 2 g in dextrose 5 % 50 mL IVPB  Status:  Discontinued     2 g 100 mL/hr over 30 Minutes Intravenous Every 12 hours 07/22/16 1702 07/25/16 1550   07/22/16 1715  vancomycin (VANCOCIN) 1,500 mg in sodium chloride 0.9 % 500 mL IVPB     1,500 mg 250 mL/hr over 120 Minutes Intravenous  Once 07/22/16 1706 07/22/16 2053      Subjective:   [name] seen and examined today. No acute events overnight. Continues to complain about pain while appearing in no distress..   Objective:   Vitals:   07/28/16 1858 07/28/16 2038 07/29/16 0642 07/29/16 0954  BP: (!) 165/87 (!) 165/73 (!) 160/81 (!) 158/70  Pulse:  76 74 80  Resp: 18 17 17 18   Temp: 98.8 F (37.1 C) 98.3 F (36.8 C) 97.8 F (36.6 C)  98 F (36.7 C)  TempSrc: Oral Oral Oral Oral  SpO2:  100% 98% 95%  Weight:    74.2 kg (163 lb 9.3 oz)  Height:        Intake/Output Summary (Last 24 hours) at 07/29/16 1546 Last data filed at 07/29/16 0846  Gross per 24 hour  Intake              700 ml  Output              550 ml  Net              150 ml   Filed Weights   07/27/16 0500 07/28/16 0427 07/29/16 0954  Weight: 74.3 kg (163 lb 12.8 oz) 71.4 kg (157 lb 6.5 oz) 74.2 kg (163 lb 9.3 oz)    Exam  General: Well developed, well nourished, NAD, appears stated age  HEENT: NCAT,  mucous membranes moist.   Cardiovascular: S1 S2 auscultated, RRR, no murmurs  Respiratory: Clear to auscultation bilaterally with equal chest rise  Abdomen: Soft, nontender, nondistended, + bowel sounds  Extremities: warm  dry without cyanosis clubbing or edema. Right foot in post op shoe  Neuro: AAOx3, nonfoal  Psych: Anxious     Data Reviewed: I have personally reviewed following labs and imaging studies  CBC:  Recent Labs Lab 07/22/16 1809  07/25/16 0240 07/26/16 0346 07/27/16 0204 07/28/16 0302 07/29/16 0504  WBC 17.5*  < > 13.1* 12.2* 13.5* 13.4* 9.6  NEUTROABS 13.9*  --   --   --   --   --   --   HGB 12.0*  < > 10.9* 11.1* 10.0* 10.3* 10.5*  HCT 34.4*  < > 32.7* 32.3* 30.3* 30.7* 31.3*  MCV 88.4  < > 89.3 88.7 89.9 89.2 89.2  PLT 339  < > 389 418* 417* 335 419*  < > = values in this interval not displayed. Basic Metabolic Panel:  Recent Labs Lab 07/25/16 0240 07/26/16 0346 07/27/16 0204 07/28/16 0302 07/29/16 0504  NA 135 137 135 138 137  K 4.0 4.2 5.0 4.5 4.4  CL 101 103 98* 102 101  CO2 27 28 24 29 26   GLUCOSE 86 122* 405* 139* 84  BUN 11 20 19  27* 18  CREATININE 0.98 1.04 1.18 1.06 0.86  CALCIUM 8.9 9.0 8.8* 9.0 8.9   GFR: Estimated Creatinine Clearance: 86.5 mL/min (by C-G formula based on SCr of 0.86 mg/dL). Liver Function Tests:  Recent Labs Lab 07/22/16 1809 07/24/16 0329  AST 30 25  ALT 23 23    ALKPHOS 77 71  BILITOT 0.2* 0.5  PROT 6.6 6.6  ALBUMIN 2.5* 2.4*   No results for input(s): LIPASE, AMYLASE in the last 168 hours. No results for input(s): AMMONIA in the last 168 hours. Coagulation Profile:  Recent Labs Lab 07/25/16 0240  INR 1.00   Cardiac Enzymes: No results for input(s): CKTOTAL, CKMB, CKMBINDEX, TROPONINI in the last 168 hours. BNP (last 3 results) No results for input(s): PROBNP in the last 8760 hours. HbA1C:  Recent Labs  07/28/16 0302  HGBA1C 7.9*   CBG:  Recent Labs Lab 07/28/16 1219 07/28/16 1640 07/28/16 2035 07/29/16 0739 07/29/16 1217  GLUCAP 211* 227* 83 156* 130*   Lipid Profile: No results for input(s): CHOL, HDL, LDLCALC, TRIG, CHOLHDL, LDLDIRECT in the last 72 hours. Thyroid Function Tests: No results for input(s): TSH, T4TOTAL, FREET4, T3FREE, THYROIDAB in the last 72 hours. Anemia Panel: No results for input(s): VITAMINB12, FOLATE, FERRITIN, TIBC, IRON, RETICCTPCT in the last 72 hours. Urine analysis:    Component Value Date/Time   COLORURINE STRAW (A) 04/18/2016 1333   APPEARANCEUR CLEAR 04/18/2016 1333   LABSPEC 1.020 04/18/2016 1333   PHURINE 5.0 04/18/2016 1333   GLUCOSEU >=500 (A) 04/18/2016 1333   GLUCOSEU 500 04/28/2012 1149   HGBUR SMALL (A) 04/18/2016 1333   BILIRUBINUR NEGATIVE 04/18/2016 1333   BILIRUBINUR neg 04/28/2012 1053   KETONESUR 20 (A) 04/18/2016 1333   PROTEINUR 100 (A) 04/18/2016 1333   UROBILINOGEN 4.0 (H) 11/16/2013 1646   NITRITE NEGATIVE 04/18/2016 1333   LEUKOCYTESUR NEGATIVE 04/18/2016 1333   Sepsis Labs: @LABRCNTIP (procalcitonin:4,lacticidven:4)  ) Recent Results (from the past 240 hour(s))  MRSA PCR Screening     Status: None   Collection Time: 07/22/16  5:42 PM  Result Value Ref Range Status   MRSA by PCR NEGATIVE NEGATIVE Final    Comment:        The GeneXpert MRSA Assay (FDA approved for NASAL specimens only), is one component of a comprehensive MRSA  colonization surveillance program. It is not intended to diagnose  MRSA infection nor to guide or monitor treatment for MRSA infections.   Culture, blood (routine x 2)     Status: None   Collection Time: 07/23/16  4:10 PM  Result Value Ref Range Status   Specimen Description BLOOD RIGHT HAND  Final   Special Requests IN PEDIATRIC BOTTLE Blood Culture adequate volume  Final   Culture NO GROWTH 5 DAYS  Final   Report Status 07/28/2016 FINAL  Final  Culture, blood (routine x 2)     Status: None   Collection Time: 07/23/16  4:10 PM  Result Value Ref Range Status   Specimen Description BLOOD LEFT HAND  Final   Special Requests IN PEDIATRIC BOTTLE Blood Culture adequate volume  Final   Culture NO GROWTH 5 DAYS  Final   Report Status 07/28/2016 FINAL  Final  Surgical pcr screen     Status: None   Collection Time: 07/25/16  8:29 PM  Result Value Ref Range Status   MRSA, PCR NEGATIVE NEGATIVE Final   Staphylococcus aureus NEGATIVE NEGATIVE Final    Comment:        The Xpert SA Assay (FDA approved for NASAL specimens in patients over 70 years of age), is one component of a comprehensive surveillance program.  Test performance has been validated by [facility] for patients greater than or equal to 43 year old. It is not intended to diagnose infection nor to guide or monitor treatment.       Radiology Studies: No results found.   Scheduled Meds: . aspirin EC  81 mg Oral Daily  . clopidogrel  75 mg Oral Daily  . docusate sodium  100 mg Oral Daily  . enoxaparin (LOVENOX) injection  40 mg Subcutaneous Q24H  . feeding supplement (GLUCERNA SHAKE)  237 mL Oral BID BM  . folic acid  1 mg Oral Daily  . gabapentin  300 mg Oral TID  . insulin aspart  0-20 Units Subcutaneous TID WC  . insulin aspart  0-5 Units Subcutaneous QHS  . insulin aspart  8 Units Subcutaneous TID WC  . insulin detemir  20 Units Subcutaneous Daily  . linezolid  600 mg Oral Q12H  . methocarbamol  500 mg Oral  TID  . multivitamin with minerals  1 tablet Oral Daily  . nicotine  21 mg Transdermal Daily  . thiamine  100 mg Oral Daily   Continuous Infusions: . sodium chloride Stopped (07/27/16 0500)  . lactated ringers 10 mL/hr at 07/26/16 0855     LOS: 7 days   Time Spent in minutes   30 minutes Author:  Berle Mull, MD [facility] Pager: [phone] 07/29/2016 3:48 PM     After 7pm go to [email or web address] - password [email or web address]  And look for the night coverage person covering for me after hours  [facility] Office  [phone]

## 2016-07-29 NOTE — Progress Notes (Signed)
Physical Therapy Treatment Patient Details Name: [name] MRN: [id number] DOB: Dec 30, 1955 Today's Date: 07/29/2016    History of Present Illness 61 y.o.malewith a history of DM1 (uncontrolled), hep C, HTN, depression, drug abuse, tob use. Pt with diabetic foot infection - R great toe.  Pt with older heel wound from Jan 18. s/p amputation R great toe and angioplasty R femoral, popliteal, and anterior tibial arteries 07/26/16.    PT Comments    Pt very focused on pain, but agreed to ambulate. He ambulated 120' with RW and R darco shoe without loss of balance, distance limited by pain. Performed RLE exercises. Pt thinks he will DC to a friend's home rather than to his son's, his son is rarely home.    Follow Up Recommendations  Home health PT;Supervision for mobility/OOB     Equipment Recommendations  Rolling walker with 5" wheels    Recommendations for Other Services       Precautions / Restrictions Precautions Precaution Comments: wound vac to R great toe, has darco wedge shoe Restrictions Weight Bearing Restrictions: No RLE Weight Bearing: Weight bearing as tolerated (wound vac to right grt toe)    Mobility  Bed Mobility Overal bed mobility: Independent Bed Mobility: Supine to Sit     Supine to sit: Independent        Transfers Overall transfer level: Needs assistance Equipment used: Rolling walker (2 wheeled) Transfers: Sit to/from Stand Sit to Stand: Min guard         General transfer comment: VCs hand placement and to scoot to EOB   Ambulation/Gait Ambulation/Gait assistance: Min guard Ambulation Distance (Feet): 120 Feet Assistive device: Rolling walker (2 wheeled) Gait Pattern/deviations: Step-to pattern (swing-to pattern) Gait velocity: decreased   General Gait Details: ambulated with R darco wedge shoe and L sneaker, distance limited by pain, no LOB   Stairs            Wheelchair Mobility    Modified Rankin (Stroke Patients Only)        Balance Overall balance assessment: Needs assistance Sitting-balance support: No upper extremity supported Sitting balance-Leahy Scale: Good     Standing balance support: Single extremity supported Standing balance-Leahy Scale: Fair Standing balance comment: using rw for ambulation                            Cognition Arousal/Alertness: Awake/alert Behavior During Therapy: WFL for tasks assessed/performed Overall Cognitive Status: Within Functional Limits for tasks assessed                                        Exercises General Exercises - Lower Extremity Ankle Circles/Pumps: AROM;Right;10 reps;Supine Heel Slides: AAROM;Right;10 reps;Supine Hip ABduction/ADduction: AAROM;Right;10 reps;Supine    General Comments        Pertinent Vitals/Pain Pain Score: 10-Worst pain ever Pain Location: R foot Pain Descriptors / Indicators: Sore Pain Intervention(s): Limited activity within patient's tolerance;Monitored during session;Premedicated before session    Home Living                      Prior Function            PT Goals (current goals can now be found in the care plan section) Acute Rehab PT Goals Patient Stated Goal: reduce pain, be able to walk PT Goal Formulation: With patient Time For Goal Achievement:  08/08/16 Potential to Achieve Goals: Good Progress towards PT goals: Progressing toward goals    Frequency    Min 3X/week      PT Plan Current plan remains appropriate    Co-evaluation             End of Session Equipment Utilized During Treatment: Gait belt Activity Tolerance: Patient limited by pain Patient left: in chair (with transport after session to go for test) Nurse Communication: Mobility status PT Visit Diagnosis: Unsteadiness on feet (R26.81);Pain;Difficulty in walking, not elsewhere classified (R26.2) Pain - Right/Left: Right Pain - part of body: Ankle and joints of foot     Time:  1345-1411 PT Time Calculation (min) (ACUTE ONLY): 26 min  Charges:  $Gait Training: 8-22 mins $Therapeutic Exercise: 8-22 mins                    G Codes:          Philomena Doheny 07/29/2016, 2:20 PM [phone]

## 2016-07-29 NOTE — Care Management Note (Addendum)
Case Management Note  Patient Details  Name: [name] MRN: [id number] Date of Birth: [birth year]  Subjective/Objective:  Pt admitted on 07/22/16 with diabetic foot infection Rt great toe; s/p amputation Rt great toe on 07/26/16.  PTA, pt resided at home with son and his girlfriend.                     Action/Plan: Met with pt to discuss discharge plans.  Pt states he has several options for discharge, and is narrowing them down. PT recommending [facility] follow up, and pt is agreeable to [facility] services, though he is not very excited at the thought of having wound VAC at home.   Spoke with Dr. Trula Slade regarding need for wound VAC at home:  He states that pt can be sent home on daily wet to dry dressings with HHRN, and VAC can be removed on 07/30/16.  Would recommend [facility] for wound care, PT, and OT for home.  Have checked coverage for generic Zyvox, and copay is $3.35.  Will check coverage on Neurontin and Plavix, per MD requests.  Feel pt able to dc 4/17 with [facility] services, if medically stable.     4/17 [name] RN CM spoke with patient he would like to DC home. He states he will stay with a friend who has a one story apartment. [place]  [phone]. Patient will need [facility] nurse to assist with dressing changes, and PT. Patient would like to use [facility].   Expected Discharge Date:  07/25/16               Expected Discharge Plan:  [place]  In-House Referral:     Discharge planning Services  CM Consult  Post Acute Care Choice:    Choice offered to:     DME Arranged:    DME Agency:     HH Arranged:    HH Agency:     Status of Service:  In process, will continue to follow  If discussed at Long Length of Stay Meetings, dates discussed:    Additional Comments:  Ella Bodo, RN 07/29/2016, 8:39 PM [phone]

## 2016-07-30 ENCOUNTER — Encounter (HOSPITAL_COMMUNITY): Payer: Self-pay | Admitting: Surgery

## 2016-07-30 LAB — GLUCOSE, CAPILLARY
GLUCOSE-CAPILLARY: 316 mg/dL — AB (ref 65–99)
GLUCOSE-CAPILLARY: 53 mg/dL — AB (ref 65–99)
Glucose-Capillary: 203 mg/dL — ABNORMAL HIGH (ref 65–99)
Glucose-Capillary: 101 mg/dL — ABNORMAL HIGH (ref 65–99)
Glucose-Capillary: 46 mg/dL — ABNORMAL LOW (ref 65–99)

## 2016-07-30 LAB — BASIC METABOLIC PANEL
Anion gap: 11 (ref 5–15)
BUN: 15 mg/dL (ref 6–20)
CO2: 25 mmol/L (ref 22–32)
CREATININE: 0.91 mg/dL (ref 0.61–1.24)
Calcium: 9.3 mg/dL (ref 8.9–10.3)
Chloride: 99 mmol/L — ABNORMAL LOW (ref 101–111)
GFR calc Af Amer: >60 mL/min (ref >60)
Glucose, Bld: 189 mg/dL — ABNORMAL HIGH (ref 65–99)
Potassium: 4.8 mmol/L (ref 3.5–5.1)
SODIUM: 135 mmol/L (ref 135–145)

## 2016-07-30 LAB — CBC
HCT: 34.5 % — ABNORMAL LOW (ref 39.0–52.0)
Hemoglobin: 11.7 g/dL — ABNORMAL LOW (ref 13.0–17.0)
MCH: 30.2 pg (ref 26.0–34.0)
MCHC: 33.9 g/dL (ref 30.0–36.0)
MCV: 88.9 fL (ref 78.0–100.0)
PLATELETS: 453 10*3/uL — AB (ref 150–400)
RBC: 3.88 MIL/uL — AB (ref 4.22–5.81)
RDW: 12.6 % (ref 11.5–15.5)
WBC: 10.5 10*3/uL (ref 4.0–10.5)

## 2016-07-30 MED ORDER — GLUCOSE 40 % PO GEL
ORAL | Status: AC
Start: 2016-07-30 — End: 2016-07-30
  Administered 2016-07-30: 37.5 g
  Filled 2016-07-30: qty 1

## 2016-07-30 NOTE — Progress Notes (Addendum)
Benefit Check sent for Zyvox 600 po bid through 5/5/ Will post result when available. Benefit check was completed on 07-29-16   S/W [facility] @ [place] # [phone]   1.ZYVOX 600 MG  COVER- NOT [place] # [phone] FOR EXCEPTION   2. LINEZOLID 600 MG BID  COVER- YES  CO-PAY- $ 3.35 And m/o for 90 day supply $ 3.35  TIER- 4 DRUG  PRIOR APPROVAL- NO   PREFERRED PHARMACY : [facility] AND [facility]

## 2016-07-30 NOTE — Progress Notes (Signed)
Wound vac removed, wet to dry dressing in place. Telemetry discontinued, CCMD notified. IV discontinued, catheter intact, site clean and dry. Crutches at bedside. Pt will be discharge to home with [facility] and PT.

## 2016-07-30 NOTE — Progress Notes (Signed)
Hypoglycemic Event  CBG: 46  Treatment: 1 tube instant glucose  Symptoms: Shaky  Follow-up CBG: Time:16:22 CBG Result:101  Possible Reasons for Event: Unknown  Comments/MD notified: [employer], Edmonia Gonser R

## 2016-07-30 NOTE — Progress Notes (Addendum)
Inpatient Diabetes Program Recommendations  AACE/ADA: New Consensus Statement on Inpatient Glycemic Control (2015)  Target Ranges:  Prepandial:   less than 140 mg/dL      Peak postprandial:   less than 180 mg/dL (1-2 hours)      Critically ill patients:  140 - 180 mg/dL    Review of Glycemic Control  Hypoglycemia in the 40's this afternoon. Patient given meal coverage when it was charted patient ate 0% of meal. At breakfast. Insulin may have stacked to result in hypoglycemia. Since patient has DM 1 consider decreasing correction scale to Novolog Moderate.  Parameters on meal coverage order need to be added (parameters already included when ordered through the glycemic control order set): Do not give if patient consumes <50% of meal or glucose level is <80 mg/dl.  Thanks,  Tama Headings RN, MSN, [facility] Inpatient Diabetes Coordinator Team Pager [phone] (8a-5p)

## 2016-07-30 NOTE — Progress Notes (Addendum)
Benefits check results:    S/W [facility] @ [facility] # [phone]   1. NEURONTIN 300 MG 3 X DAILY  COVER- NOT COVER  PRIOR APPROVAL- YES # [phone] FOR EXCEPTION   2 GABAPENTIN 300 MG  COVER- YES  CO-PAY- $ 3.35 And m/o for 90 day supply zero dollars  TIER- 2 DRUG  PRIOR APPROVAL- NO   3 . PLAVIX 75 MG DAILY  COVER- NOT COVER  PRIOR APPROVAL- YES # [phone] FOR EXCEPTION   4. CLOPIDOGREL 75 MG DAILY  COVER- YES  CO-PAY- $ 3.35  And m/o for 90 day supply zero dollars  TIER- 1 DRUG  PRIOR APPROVAL- NO   PREFERRED PHARMACY : [facility]    Reinaldo Raddle, RN, BSN  Trauma/Neuro ICU Case Manager [phone]

## 2016-07-30 NOTE — Progress Notes (Signed)
Physical Therapy Treatment Patient Details Name: [name] MRN: [id number] DOB: Jun 20, 1955 Today's Date: 07/30/2016    History of Present Illness 61 y.o.malewith a history of DM1 (uncontrolled), hep C, HTN, depression, drug abuse, tob use. Pt with diabetic foot infection - R great toe.  Pt with older heel wound from Jan 18. s/p amputation R great toe and angioplasty R femoral, popliteal, and anterior tibial arteries 07/26/16.    PT Comments    Pt was seen for gait and transitional movement bed to stand to chair to reposition, and is fairly uncomfortable with all mobility.  His initial standing was NWB on R foot but then progressed to light pressure on heel of Darco shoe.  Pt is anxious about home and t alking about going with a friend and also with his son.  Has a lot of anxiety about making the decision but per chart has already planned his discharge home.  Continue acutely as needed for gait and LE strengthening.   Follow Up Recommendations  Home health PT;Supervision for mobility/OOB     Equipment Recommendations  Rolling walker with 5" wheels    Recommendations for Other Services       Precautions / Restrictions Precautions Precautions: Fall (telemetry) Precaution Comments: wound vac to R great toe, has darco wedge shoe Restrictions Weight Bearing Restrictions: No RLE Weight Bearing: Weight bearing as tolerated    Mobility  Bed Mobility Overal bed mobility: Modified Independent Bed Mobility: Supine to Sit     Supine to sit: Modified independent (Device/Increase time)     General bed mobility comments: used bed rail and slow with his foot hurting him  Transfers Overall transfer level: Needs assistance Equipment used: Rolling walker (2 wheeled) Transfers: Sit to/from [facility] Sit to Stand: Min guard;Min assist Stand pivot transfers: Min assist       General transfer comment: cued sequence and offered help to manage walker in confined  space  Ambulation/Gait Ambulation/Gait assistance: Min assist;Min guard Ambulation Distance (Feet): 120 Feet Assistive device: Rolling walker (2 wheeled) Gait Pattern/deviations: Step-to pattern;Step-through pattern;Decreased stance time - right;Decreased weight shift to right;Antalgic;Wide base of support Gait velocity: decreased Gait velocity interpretation: Below normal speed for age/gender General Gait Details: ambulated with R darco wedge shoe and L sneaker, distance limited by pain, no LOB   Stairs            Wheelchair Mobility    Modified Rankin (Stroke Patients Only)       Balance Overall balance assessment: Needs assistance Sitting-balance support: Feet supported Sitting balance-Leahy Scale: Good   Postural control: Posterior lean Standing balance support: Bilateral upper extremity supported Standing balance-Leahy Scale: Poor Standing balance comment: reliant on RW initially then finally less UE support to stand                            Cognition Arousal/Alertness: Awake/alert Behavior During Therapy: WFL for tasks assessed/performed Overall Cognitive Status: Within Functional Limits for tasks assessed                                 General Comments: nervous about how going home will be because his son is not there all the time      Exercises      General Comments        Pertinent Vitals/Pain Pain Assessment: 0-10 Pain Score: 6  Pain Location: R foot  Pain Descriptors / Indicators: Operative site guarding Pain Intervention(s): Monitored during session;Premedicated before session;Repositioned    Home Living                      Prior Function            PT Goals (current goals can now be found in the care plan section) Acute Rehab PT Goals Patient Stated Goal: walk and get home PT Goal Formulation: With patient Progress towards PT goals: Progressing toward goals    Frequency    Min 3X/week       PT Plan Current plan remains appropriate    Co-evaluation             End of Session Equipment Utilized During Treatment: Gait belt Activity Tolerance: Patient limited by pain Patient left: in chair;with call bell/phone within reach;with chair alarm set Nurse Communication: Mobility status PT Visit Diagnosis: Unsteadiness on feet (R26.81);Pain;Difficulty in walking, not elsewhere classified (R26.2) Pain - Right/Left: Right Pain - part of body: Ankle and joints of foot     Time: 6389-3734 PT Time Calculation (min) (ACUTE ONLY): 29 min  Charges:  $Gait Training: 8-22 mins $Therapeutic Activity: 8-22 mins                    G Codes:        Ramond Dial 06-Aug-2016, 11:34 AM   Mee Hives, PT MS Acute Rehab Dept. Number: [place] and [place]

## 2016-07-30 NOTE — Progress Notes (Signed)
Patient discharge teaching given, including activity, diet, follow-up appoints, and medications. Patient verbalized understanding of all discharge instructions. IV access was d/c'd. Vitals are stable. Skin is intact except as charted in most recent assessments. Pt to be escorted out by NT, to be driven home by family.  Pasha Broad, MBA, BSN, RN 

## 2016-07-30 NOTE — Progress Notes (Signed)
  Progress Note    07/30/2016 1:45 PM 4 Days Post-Op  Subjective:  Pt thankful to Dr. Trula Slade  afebrile  Vitals:   07/30/16 0537 07/30/16 0915  BP: (!) 162/78 (!) 168/70  Pulse: 76 86  Resp: 20 19  Temp: 98.5 F (36.9 C) 97.7 F (36.5 C)    Physical Exam: Lungs:  Non labored Incisions:  Left great toe with wound vac in place with good seal   CBC    Component Value Date/Time   WBC 10.5 07/30/2016 0444   RBC 3.88 (L) 07/30/2016 0444   HGB 11.7 (L) 07/30/2016 0444   HCT 34.5 (L) 07/30/2016 0444   PLT 453 (H) 07/30/2016 0444   MCV 88.9 07/30/2016 0444   MCH 30.2 07/30/2016 0444   MCHC 33.9 07/30/2016 0444   RDW 12.6 07/30/2016 0444   LYMPHSABS 2.5 07/22/2016 1809   MONOABS 0.9 07/22/2016 1809   EOSABS 0.2 07/22/2016 1809   BASOSABS 0.0 07/22/2016 1809    BMET    Component Value Date/Time   NA 135 07/30/2016 0444   K 4.8 07/30/2016 0444   CL 99 (L) 07/30/2016 0444   CO2 25 07/30/2016 0444   GLUCOSE 189 (H) 07/30/2016 0444   BUN 15 07/30/2016 0444   CREATININE 0.91 07/30/2016 0444   CALCIUM 9.3 07/30/2016 0444   GFRNONAA >60 07/30/2016 0444   GFRAA >60 07/30/2016 0444    INR    Component Value Date/Time   INR 1.00 07/25/2016 0240     Intake/Output Summary (Last 24 hours) at 07/30/16 1345 Last data filed at 07/30/16 1337  Gross per 24 hour  Intake              600 ml  Output             2175 ml  Net            -1575 ml     Assessment:  61 y.o. male is s/p:  Procedure Performed:             1.  Ultrasound-guided access, left femoral artery             2.  Right lower extremity runoff             3.  Angioplasty, right anterior tibial artery             4.  Angioplasty , right superficial femoral and popliteal artery             5.  Stent, right popliteal artery             6.  Amputation of right great toe including metatarsal head             7.  Closure device             8. VAC placement to right great toe  4 Days Post-Op  Plan: -pt  with wound vac in place-converting this to wet to dry dressing prior to discharge.  HH for dressing changes -f/u with Dr. Trula Slade in 2-3 weeks.    Leontine Locket, PA-C [facility] [phone] 07/30/2016 1:45 PM

## 2016-07-30 NOTE — Care Management Important Message (Signed)
Important Message  Patient Details  Name: [name] MRN: [id number] Date of Birth: 08-18-1955   Medicare Important Message Given:  Yes    Revecca Nachtigal Montine Circle 07/30/2016, 2:34 PM

## 2016-07-31 ENCOUNTER — Other Ambulatory Visit: Payer: Self-pay | Admitting: *Deleted

## 2016-07-31 ENCOUNTER — Telehealth: Payer: Self-pay | Admitting: Surgery

## 2016-07-31 DIAGNOSIS — Z89411 Acquired absence of right great toe: Secondary | ICD-10-CM | POA: Diagnosis not present

## 2016-07-31 DIAGNOSIS — Z4781 Encounter for orthopedic aftercare following surgical amputation: Secondary | ICD-10-CM | POA: Diagnosis not present

## 2016-07-31 DIAGNOSIS — E1065 Type 1 diabetes mellitus with hyperglycemia: Secondary | ICD-10-CM | POA: Diagnosis not present

## 2016-07-31 DIAGNOSIS — F329 Major depressive disorder, single episode, unspecified: Secondary | ICD-10-CM | POA: Diagnosis not present

## 2016-07-31 DIAGNOSIS — L97401 Non-pressure chronic ulcer of unspecified heel and midfoot limited to breakdown of skin: Secondary | ICD-10-CM | POA: Diagnosis not present

## 2016-07-31 DIAGNOSIS — E1052 Type 1 diabetes mellitus with diabetic peripheral angiopathy with gangrene: Secondary | ICD-10-CM | POA: Diagnosis not present

## 2016-07-31 DIAGNOSIS — B1920 Unspecified viral hepatitis C without hepatic coma: Secondary | ICD-10-CM | POA: Diagnosis not present

## 2016-07-31 DIAGNOSIS — E103519 Type 1 diabetes mellitus with proliferative diabetic retinopathy with macular edema, unspecified eye: Secondary | ICD-10-CM | POA: Diagnosis not present

## 2016-07-31 DIAGNOSIS — I10 Essential (primary) hypertension: Secondary | ICD-10-CM | POA: Diagnosis not present

## 2016-07-31 DIAGNOSIS — I70261 Atherosclerosis of native arteries of extremities with gangrene, right leg: Secondary | ICD-10-CM

## 2016-07-31 NOTE — Telephone Encounter (Signed)
spoke to son on phone, req I leaveVM and mail letters for appt info 5/10 ABI, 5/18 LE duplex, 5/21 OV

## 2016-07-31 NOTE — Telephone Encounter (Signed)
-----   Message from Mena Goes, RN sent at 07/31/2016  9:29 AM EDT ----- Regarding: 2-4 weeks w/ labs   ----- Message ----- From: Serafina Mitchell, MD Sent: 07/30/2016  10:25 PM To: Vvs Charge Pool  Needs f/u in 2-4 weeks with right leg duplex and abi

## 2016-08-01 DIAGNOSIS — L97401 Non-pressure chronic ulcer of unspecified heel and midfoot limited to breakdown of skin: Secondary | ICD-10-CM | POA: Diagnosis not present

## 2016-08-01 DIAGNOSIS — Z4781 Encounter for orthopedic aftercare following surgical amputation: Secondary | ICD-10-CM | POA: Diagnosis not present

## 2016-08-01 DIAGNOSIS — E1065 Type 1 diabetes mellitus with hyperglycemia: Secondary | ICD-10-CM | POA: Diagnosis not present

## 2016-08-01 DIAGNOSIS — I10 Essential (primary) hypertension: Secondary | ICD-10-CM | POA: Diagnosis not present

## 2016-08-01 DIAGNOSIS — Z89411 Acquired absence of right great toe: Secondary | ICD-10-CM | POA: Diagnosis not present

## 2016-08-01 DIAGNOSIS — E103519 Type 1 diabetes mellitus with proliferative diabetic retinopathy with macular edema, unspecified eye: Secondary | ICD-10-CM | POA: Diagnosis not present

## 2016-08-01 DIAGNOSIS — F329 Major depressive disorder, single episode, unspecified: Secondary | ICD-10-CM | POA: Diagnosis not present

## 2016-08-01 DIAGNOSIS — B1920 Unspecified viral hepatitis C without hepatic coma: Secondary | ICD-10-CM | POA: Diagnosis not present

## 2016-08-01 DIAGNOSIS — E1052 Type 1 diabetes mellitus with diabetic peripheral angiopathy with gangrene: Secondary | ICD-10-CM | POA: Diagnosis not present

## 2016-08-01 NOTE — Discharge Summary (Signed)
[name] Discharge Summary   Patient: [name]:[id number]   PCP: No primary care provider on file. 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 Please follow up with vascular surgery   Follow-up Information    PCP. Schedule an appointment as soon as possible for a visit in 1 week.        Annamarie Major, MD. Schedule an appointment as soon as possible for a visit in 2 weeks.   Specialties:  Vascular Surgery, Cardiology Why:  APPOINTMENT: MONDAY, 08-12-16 AT 2:45 PM, ARRIVE BY 2:30 PM FOR PAPERWORK & CHECK IN Contact information: [place] [address] [place].   Why:  For home health PT and RN. They will contact you for first visit in 1-2 days. Contact information: [place] [address] [phone]          Diet recommendation: cardiac carb modified diet  Activity: The patient is advised to gradually reintroduce usual activities.  Discharge Condition: good  Code Status: full code  History of present illness: As per the H and P dictated on admission, "Pt admitted on 07/22/16 directly from Dr. Evette Georges office. Accepted pt to [facility] service on 07/23/16 after discussing pt condition w/ Dr. Radford Pax.  As noted from his H&P [name] a 60  y.o.malewith a history of DM1 (uncontrolled), hep C, HTN, depression, drug abuse, tob use. He smoked crack last 3 days ago. He has been having problems with his R great toe for over a month. It started after he was hospitalized with DKA and SIRS in January 2018. He had a R heel ulcer in January and was on antibiotics. He saw Dr Loanne Drilling 02/05 and was referred to a a wound specialist for the heel wound. He then developed the right great toe wound. The right heel wound is blackened but not draining and not eating larger. The right great toe wound has been worsening steadily. It has grown more swollen, or reddened, started blackening and his foot has become swollen. In the last 3 or 4 days, he has developed chills and subjective fevers. He has general malaise and generally feels very bad. He is unable to walk on the foot. He has a supportive shoe and a walker that he uses to get around. He is not having any cardiac symptoms. Specifically, he is not having any chest pain, no new dyspnea on exertion and shortness of breath. He denies orthopnea or PND. He is not having palpitations. He is having weakness and some dizziness. He has not been checking his blood sugars. He has been smoking marijuana which helps with the pain."  Hospital Course:  Summary of his active problems in the hospital is as following. R toe gangrene and  cellulitis/Peripheral vascular disease. -Pulses limited on exam. h/o PAD -Started on broad-spectrum antibiotics vancomycin and cefepime on admission, currently discontinued as per discussion with infectious disease. -Vascular surgery and orthopedics consulted and appreciated -Underwent angiogram of the right leg on 07/25/2016. Followed by amputation of great toe on the right as well as ingestion of the popliteal stent with balloon angioplasty on the right. -Infectious disease consulted and appreciated -Cannot get an MRI due to foreign body, received CT scan with contrast which is positive  for osteomyelitis.  -ABI positive, angiogram shows blockages and SFA as well as from popliteal artery. Bilaterally. -BCX (ABX started 24hrs prior to obtaining unfortunately) no growth -Given history of drug abuse patient is at high risk for substance abuse with medication. -Reviewed [place] controlled substance access, no high-risk prescriptions in last 6 months. -Initially started on scheduled OxyContin, change toNorco 6 hours as needed, placing the patient on scheduled Robaxin and increasing the dose of gabapentin. Continue Toradol. -Repeat foot xray on 07/27/16 shows: small linear bent metallic foreign body in the soft tissues plantar to the calcaneous (retained needle fragment)- Vascular feels no wound exploration needed at this time. -ID placing patient on Zyvox BID, for at least 3 weeks with outpatient follow up in 3-4 weeks.  Medication assistance and home health arranged.  Diabetes mellitus -last A1c 8.8 -Continue home regimen. -diabetes coordinator cosulted  PAD -previous studies in 2015 showing stenotic and occlusive disease bilaterally in LE.  -S/P popliteal stent implant with balloon angioplasty.  Peripheral neuropathy -Continue Neurontin increased to 300 3 times a day  Hypertension -possible exacerbated due to pain- currently stable   Chronic Hepatitis -unsure of last appt.  -HCV RNA quant 45,300 -ID consulted and appreciated  Tobacco/cocaine abuse -40+pack year smoker. Endorses recent THC and cocaine use.  -NIcotine patch -UDS positive for cocaine, benzodiazepine, cannabis.  All other chronic medical condition were stable during the hospitalization.  Patient was seen by physical therapy, who recommended home health, which was arranged by Education officer, museum and case Freight forwarder. On the day of the discharge the patient's vitals were stable, and no other acute medical condition were reported by patient. the patient was felt safe to be discharge at home with home  health.  Procedures and Results:  S/p amputation of right great toe    Consultations: Vascular surgery Orthopedic surgery  DISCHARGE MEDICATION: Discharge Medication List as of 07/30/2016  4:28 PM    START taking these medications   Details  aspirin EC 81 MG EC tablet Take 1 tablet (81 mg total) by mouth daily., Starting Tue 07/30/2016, Normal    clopidogrel (PLAVIX) 75 MG tablet Take 1 tablet (75 mg total) by mouth daily., Starting Tue 07/30/2016, Normal    docusate sodium (COLACE) 100 MG capsule Take 1 capsule (100 mg total) by mouth daily., Starting Tue 07/30/2016, Normal    feeding supplement, GLUCERNA SHAKE, (GLUCERNA SHAKE) LIQD Take 237 mLs by mouth 2 (two) times daily between meals., Starting Mon 1/61/0960, Normal    folic acid (FOLVITE) 1 MG tablet Take 1 tablet (1 mg total) by mouth daily., Starting Tue 07/30/2016, Normal    gabapentin (NEURONTIN) 300 MG capsule Take 1 capsule (300 mg total) by mouth 3 (three) times daily., Starting Mon 07/29/2016, Normal    guaiFENesin-dextromethorphan (ROBITUSSIN DM) 100-10 MG/5ML syrup Take 15 mLs by mouth every 4 (four) hours as needed for cough., Starting Mon 07/29/2016, Normal    HYDROcodone-acetaminophen (NORCO/VICODIN) 5-325 MG tablet Take 1 tablet by mouth every 6 (six) hours  as needed for severe pain., Starting Mon 07/29/2016, Print    linezolid (ZYVOX) 600 MG tablet Take 1 tablet (600 mg total) by mouth every 12 (twelve) hours., Starting Mon 07/29/2016, Until Fri 08/16/2016, Normal    methocarbamol (ROBAXIN) 500 MG tablet Take 1 tablet (500 mg total) by mouth every 8 (eight) hours as needed for muscle spasms., Starting Mon 07/29/2016, Normal    nicotine (NICODERM CQ - DOSED IN MG/24 HOURS) 21 mg/24hr patch Place 1 patch (21 mg total) onto the skin daily., Starting Tue 07/30/2016, Normal    thiamine 100 MG tablet Take 1 tablet (100 mg total) by mouth daily., Starting Tue 07/30/2016, Normal      CONTINUE these medications which have NOT  CHANGED   Details  insulin NPH Human (NOVOLIN N) 100 UNIT/ML injection Inject 0.25 mLs (25 Units total) into the skin 2 (two) times daily at 8 am and 10 pm., Starting Fri 06/07/2016, Print       Allergies  Allergen Reactions  . Codeine Itching   Discharge Instructions    DME Crutches    Complete by:  As directed    Diet - low sodium heart healthy    Complete by:  As directed    Increase activity slowly    Complete by:  As directed      Discharge Exam: Filed Weights   07/29/16 0954 07/29/16 2112 07/30/16 0500  Weight: 74.2 kg (163 lb 9.3 oz) 74.3 kg (163 lb 12.8 oz) 74.3 kg (163 lb 12.8 oz)   Vitals:   07/30/16 0537 07/30/16 0915  BP: (!) 162/78 (!) 168/70  Pulse: 76 86  Resp: 20 19  Temp: 98.5 F (36.9 C) 97.7 F (36.5 C)   General: Appear in no distress, no Rash; Oral Mucosa moist. Cardiovascular: S1 and S2 Present, no Murmur, no JVD Respiratory: Bilateral Air entry present and Clear to Auscultation, no Crackles, no wheezes Abdomen: Bowel Sound present, Soft and no tenderness Extremities: no Pedal edema, no calf tenderness Neurology: Grossly no focal neuro deficit.  The results of significant diagnostics from this hospitalization (including imaging, microbiology, ancillary and laboratory) are listed below for reference.    Significant Diagnostic Studies: Ct Foot Right W Contrast  Result Date: 07/24/2016 CLINICAL DATA:  Foot infection EXAM: CT OF THE LOWER RIGHT EXTREMITY WITH CONTRAST TECHNIQUE: Multidetector CT imaging of the lower right extremity was performed according to the standard protocol following intravenous contrast administration. COMPARISON:  06/07/2016 radiographs of the right foot CONTRAST:  72mL ISOVUE-300 IOPAMIDOL (ISOVUE-300) INJECTION 61% FINDINGS: Bones/Joint/Cartilage Cortical bone loss and destruction along the plantar aspect of the first distal phalanx. Adjacent soft tissue and intraosseous gas is noted within the first distal phalanx concerning  for changes of osteomyelitis with adjacent soft tissue ulceration. No acute fracture of the visualized ankle and foot. The ankle and subtalar joints are maintained as are the midfoot articulations. Mild degenerate changes are noted across the ankle joint with subchondral cystic changes of the talar dome laterally. Ligaments Suboptimally assessed by CT. Muscles and Tendons No muscle atrophy or evidence pyomyositis. Soft tissues Soft tissue swelling and ulceration with soft tissue emphysema along the plantar aspect of the great toe near the tuft. Diffuse vascular calcifications of the ankle and foot consistent with history of diabetes. Soft tissue ulceration overlying the dorsal aspect of the calcaneus. No underlying bone destruction. Calcaneal enthesophytes are seen along the plantar dorsal aspect. Small metallic linear radiopaque foreign body is seen along the plantar and lateral aspect of the  hindfoot as on the plain radiographs. IMPRESSION: 1. Soft tissue swelling of the great toe with soft tissue emphysema and ulceration extending to the distal phalanx. Cortical bone loss (series 8, image 65) is noted along the plantar aspect of the distal phalanx with intraosseous gas consistent with osteomyelitis. 2. Heel ulcer without underlying osteomyelitis of the calcaneus. 3. Thin linear metallic foreign body within the plantar lateral aspect of the soft tissues of the hindfoot (series 8, image 48). Electronically Signed   By: Ashley Royalty M.D.   On: 07/24/2016 00:19   Dg Foot Complete Right  Result Date: 07/27/2016 CLINICAL DATA:  61 year old male with history of great toe amputation yesterday. Evaluate for foreign body in the soft tissues of the right foot. EXAM: RIGHT FOOT COMPLETE - 3+ VIEW COMPARISON:  Right foot radiograph 06/07/2016. FINDINGS: Postoperative changes of recent amputation of the first digit beyond on the first metatarsal is noted. The medial aspect of the articular surface of the first metatarsal  appears to have been resected as well. A small fragment of the lateral aspect of the base of the first proximal phalanx remains. Overlying soft tissues are irregular, with surgical dressing in place. The small bent linear metallic foreign body seen on the prior study in the soft tissues along the plantar aspect of the calcaneus is again noted and unchanged. Numerous vascular calcifications are noted. IMPRESSION: 1. Small linear bent metallic foreign body in the soft tissues plantar to the calcaneus is unchanged, presumably a retained fragment of a needle. 2. Postoperative changes related to recent first digit amputation, as above. 3. Aortic atherosclerosis. Electronically Signed   By: Vinnie Langton M.D.   On: 07/27/2016 11:04    Microbiology: Recent Results (from the past 240 hour(s))  MRSA PCR Screening     Status: None   Collection Time: 07/22/16  5:42 PM  Result Value Ref Range Status   MRSA by PCR NEGATIVE NEGATIVE Final    Comment:        The GeneXpert MRSA Assay (FDA approved for NASAL specimens only), is one component of a comprehensive MRSA colonization surveillance program. It is not intended to diagnose MRSA infection nor to guide or monitor treatment for MRSA infections.   Culture, blood (routine x 2)     Status: None   Collection Time: 07/23/16  4:10 PM  Result Value Ref Range Status   Specimen Description BLOOD RIGHT HAND  Final   Special Requests IN PEDIATRIC BOTTLE Blood Culture adequate volume  Final   Culture NO GROWTH 5 DAYS  Final   Report Status 07/28/2016 FINAL  Final  Culture, blood (routine x 2)     Status: None   Collection Time: 07/23/16  4:10 PM  Result Value Ref Range Status   Specimen Description BLOOD LEFT HAND  Final   Special Requests IN PEDIATRIC BOTTLE Blood Culture adequate volume  Final   Culture NO GROWTH 5 DAYS  Final   Report Status 07/28/2016 FINAL  Final  Surgical pcr screen     Status: None   Collection Time: 07/25/16  8:29 PM  Result  Value Ref Range Status   MRSA, PCR NEGATIVE NEGATIVE Final   Staphylococcus aureus NEGATIVE NEGATIVE Final    Comment:        The Xpert SA Assay (FDA approved for NASAL specimens in patients over 6 years of age), is one component of a comprehensive surveillance program.  Test performance has been validated by [facility] for patients greater than or equal  to 71 year old. It is not intended to diagnose infection nor to guide or monitor treatment.      Labs: CBC:  Recent Labs Lab 07/26/16 0346 07/27/16 0204 07/28/16 0302 07/29/16 0504 07/30/16 0444  WBC 12.2* 13.5* 13.4* 9.6 10.5  HGB 11.1* 10.0* 10.3* 10.5* 11.7*  HCT 32.3* 30.3* 30.7* 31.3* 34.5*  MCV 88.7 89.9 89.2 89.2 88.9  PLT 418* 417* 335 419* 856*   Basic Metabolic Panel:  Recent Labs Lab 07/26/16 0346 07/27/16 0204 07/28/16 0302 07/29/16 0504 07/30/16 0444  NA 137 135 138 137 135  K 4.2 5.0 4.5 4.4 4.8  CL 103 98* 102 101 99*  CO2 28 24 29 26 25   GLUCOSE 122* 405* 139* 84 189*  BUN 20 19 27* 18 15  CREATININE 1.04 1.18 1.06 0.86 0.91  CALCIUM 9.0 8.8* 9.0 8.9 9.3   Liver Function Tests: No results for input(s): AST, ALT, ALKPHOS, BILITOT, PROT, ALBUMIN in the last 168 hours. No results for input(s): LIPASE, AMYLASE in the last 168 hours. No results for input(s): AMMONIA in the last 168 hours. Cardiac Enzymes: No results for input(s): CKTOTAL, CKMB, CKMBINDEX, TROPONINI in the last 168 hours. BNP (last 3 results) No results for input(s): BNP in the last 8760 hours. CBG:  Recent Labs Lab 07/30/16 0757 07/30/16 1155 07/30/16 1530 07/30/16 1554 07/30/16 1622  GLUCAP 316* 203* 46* 53* 101*   Time spent: 35 minutes  Signed:  Danyell Awbrey  [name] 07/30/2016 , 1:32 PM

## 2016-08-02 ENCOUNTER — Telehealth: Payer: Self-pay | Admitting: Internal Medicine

## 2016-08-02 ENCOUNTER — Encounter: Payer: Self-pay | Admitting: Surgery

## 2016-08-02 ENCOUNTER — Telehealth: Payer: Self-pay | Admitting: Endocrinology

## 2016-08-02 DIAGNOSIS — E103519 Type 1 diabetes mellitus with proliferative diabetic retinopathy with macular edema, unspecified eye: Secondary | ICD-10-CM | POA: Diagnosis not present

## 2016-08-02 DIAGNOSIS — Z89411 Acquired absence of right great toe: Secondary | ICD-10-CM | POA: Diagnosis not present

## 2016-08-02 DIAGNOSIS — E1065 Type 1 diabetes mellitus with hyperglycemia: Secondary | ICD-10-CM | POA: Diagnosis not present

## 2016-08-02 DIAGNOSIS — B1920 Unspecified viral hepatitis C without hepatic coma: Secondary | ICD-10-CM | POA: Diagnosis not present

## 2016-08-02 DIAGNOSIS — E1052 Type 1 diabetes mellitus with diabetic peripheral angiopathy with gangrene: Secondary | ICD-10-CM | POA: Diagnosis not present

## 2016-08-02 DIAGNOSIS — Z4781 Encounter for orthopedic aftercare following surgical amputation: Secondary | ICD-10-CM | POA: Diagnosis not present

## 2016-08-02 DIAGNOSIS — F329 Major depressive disorder, single episode, unspecified: Secondary | ICD-10-CM | POA: Diagnosis not present

## 2016-08-02 DIAGNOSIS — I10 Essential (primary) hypertension: Secondary | ICD-10-CM | POA: Diagnosis not present

## 2016-08-02 DIAGNOSIS — L97401 Non-pressure chronic ulcer of unspecified heel and midfoot limited to breakdown of skin: Secondary | ICD-10-CM | POA: Diagnosis not present

## 2016-08-02 NOTE — Telephone Encounter (Signed)
This needs to go through PCP.

## 2016-08-02 NOTE — Telephone Encounter (Signed)
Informed HHRN to request this from patients PCP DR. Celso Amy

## 2016-08-02 NOTE — Telephone Encounter (Signed)
Verbal orders are needs for PT two times a week for 3 wks for fall risk reduction and functional mobility  Also asking for VO for OT for ADLs   Lastly for medical social worker for community resources  [phone] Herbert Deaner with [facility]

## 2016-08-02 NOTE — Telephone Encounter (Signed)
Please advise as you have not seen pt since 01/2016, and there is no longer a PCP listed in his chart.

## 2016-08-02 NOTE — Telephone Encounter (Signed)
[name] from [facility] called requesting PT orders The pt was in the hospital and had his great toe amputation. A PT evaluation was done on 08/01/16. PT is requesting to continue PT 2 times a week for 3 weeks for function mobility. PT also request evaluation by occupational therapy for ADLs and are requesting a medical social workers for community resources.

## 2016-08-03 NOTE — Telephone Encounter (Signed)
He needs a f/u with me.   [facility] for orders

## 2016-08-05 ENCOUNTER — Telehealth: Payer: Self-pay

## 2016-08-05 DIAGNOSIS — Z89411 Acquired absence of right great toe: Secondary | ICD-10-CM | POA: Diagnosis not present

## 2016-08-05 DIAGNOSIS — G8918 Other acute postprocedural pain: Secondary | ICD-10-CM

## 2016-08-05 DIAGNOSIS — B1920 Unspecified viral hepatitis C without hepatic coma: Secondary | ICD-10-CM | POA: Diagnosis not present

## 2016-08-05 DIAGNOSIS — I10 Essential (primary) hypertension: Secondary | ICD-10-CM | POA: Diagnosis not present

## 2016-08-05 DIAGNOSIS — Z4781 Encounter for orthopedic aftercare following surgical amputation: Secondary | ICD-10-CM | POA: Diagnosis not present

## 2016-08-05 DIAGNOSIS — F329 Major depressive disorder, single episode, unspecified: Secondary | ICD-10-CM | POA: Diagnosis not present

## 2016-08-05 DIAGNOSIS — L97401 Non-pressure chronic ulcer of unspecified heel and midfoot limited to breakdown of skin: Secondary | ICD-10-CM | POA: Diagnosis not present

## 2016-08-05 DIAGNOSIS — E1052 Type 1 diabetes mellitus with diabetic peripheral angiopathy with gangrene: Secondary | ICD-10-CM | POA: Diagnosis not present

## 2016-08-05 DIAGNOSIS — E103519 Type 1 diabetes mellitus with proliferative diabetic retinopathy with macular edema, unspecified eye: Secondary | ICD-10-CM | POA: Diagnosis not present

## 2016-08-05 DIAGNOSIS — E1065 Type 1 diabetes mellitus with hyperglycemia: Secondary | ICD-10-CM | POA: Diagnosis not present

## 2016-08-05 MED ORDER — HYDROCODONE-ACETAMINOPHEN 5-325 MG PO TABS: 1.0000 | ORAL_TABLET | Freq: Four times a day (QID) | ORAL | 0 refills | Status: DC | PRN

## 2016-08-05 NOTE — Telephone Encounter (Signed)
Spoke with Clair Gulling to give verbal orders. Unable to reach pt or lvm due to voicemail being full.

## 2016-08-05 NOTE — Telephone Encounter (Signed)
Discussed with Dr. Arita Miss.  Advised to give pt. Hydrocodone/ Acetaminophen 5/325; #30; no refills prior to office visit on 4/30.  Also, approved to order shower chair for the pt. with recent (R) great toe amputation, to give support with bathing.     Notified pt. of the above recommendations.  Advised will fax order to [facility] for shower chair.  Advised the Rx for pain will be at front desk, for pick-up.  Verb. Understanding.

## 2016-08-05 NOTE — Telephone Encounter (Signed)
Herbert Deaner, PT called requesting clarification on pt's ability to shower and get the right great toe amp. site wet. Discussed with Dr. Trula Slade.  Advised okay for pt. To shower, and let warm water run on the surgical incision right great toe.  Will fax instructions to [facility] with above recommendations.

## 2016-08-05 NOTE — Telephone Encounter (Signed)
rec'd phone call from pt.  Reported he is out of pain medication.  Rated his pain level at 8.5/10.  Stated he is staying with friends, and accidentally kicked a table with his foot, hitting the toe amp site.  Stated the dressing remains intact, and it has a small amt. of bloody drainage on it.  Reported the [facility] RN will be there this afternoon, and will change the bandage.  Stated he had chills last night, but denies chills today.  Is requesting a refill on his pain medication, and an order for a shower chair.  Advised will discuss with Dr. Trula Slade, and return call to pt.  Agreed.

## 2016-08-08 DIAGNOSIS — L97411 Non-pressure chronic ulcer of right heel and midfoot limited to breakdown of skin: Secondary | ICD-10-CM | POA: Diagnosis not present

## 2016-08-08 DIAGNOSIS — L97511 Non-pressure chronic ulcer of other part of right foot limited to breakdown of skin: Secondary | ICD-10-CM | POA: Diagnosis not present

## 2016-08-08 DIAGNOSIS — E1040 Type 1 diabetes mellitus with diabetic neuropathy, unspecified: Secondary | ICD-10-CM | POA: Diagnosis not present

## 2016-08-08 DIAGNOSIS — I10 Essential (primary) hypertension: Secondary | ICD-10-CM | POA: Diagnosis not present

## 2016-08-08 DIAGNOSIS — L97512 Non-pressure chronic ulcer of other part of right foot with fat layer exposed: Secondary | ICD-10-CM | POA: Diagnosis not present

## 2016-08-08 DIAGNOSIS — J449 Chronic obstructive pulmonary disease, unspecified: Secondary | ICD-10-CM | POA: Diagnosis not present

## 2016-08-08 DIAGNOSIS — Z89421 Acquired absence of other right toe(s): Secondary | ICD-10-CM | POA: Diagnosis not present

## 2016-08-08 DIAGNOSIS — E10621 Type 1 diabetes mellitus with foot ulcer: Secondary | ICD-10-CM | POA: Diagnosis not present

## 2016-08-08 DIAGNOSIS — T8189XA Other complications of procedures, not elsewhere classified, initial encounter: Secondary | ICD-10-CM | POA: Diagnosis not present

## 2016-08-08 DIAGNOSIS — Y835 Amputation of limb(s) as the cause of abnormal reaction of the patient, or of later complication, without mention of misadventure at the time of the procedure: Secondary | ICD-10-CM | POA: Diagnosis not present

## 2016-08-08 DIAGNOSIS — E1052 Type 1 diabetes mellitus with diabetic peripheral angiopathy with gangrene: Secondary | ICD-10-CM | POA: Diagnosis not present

## 2016-08-08 DIAGNOSIS — Z7902 Long term (current) use of antithrombotics/antiplatelets: Secondary | ICD-10-CM | POA: Diagnosis not present

## 2016-08-08 DIAGNOSIS — Z95820 Peripheral vascular angioplasty status with implants and grafts: Secondary | ICD-10-CM | POA: Diagnosis not present

## 2016-08-08 DIAGNOSIS — Z7982 Long term (current) use of aspirin: Secondary | ICD-10-CM | POA: Diagnosis not present

## 2016-08-08 DIAGNOSIS — E1042 Type 1 diabetes mellitus with diabetic polyneuropathy: Secondary | ICD-10-CM | POA: Diagnosis not present

## 2016-08-08 DIAGNOSIS — F17210 Nicotine dependence, cigarettes, uncomplicated: Secondary | ICD-10-CM | POA: Diagnosis not present

## 2016-08-08 LAB — GLUCOSE, CAPILLARY
GLUCOSE-CAPILLARY: 105 mg/dL — AB (ref 65–99)
Glucose-Capillary: 52 mg/dL — ABNORMAL LOW (ref 65–99)

## 2016-08-09 DIAGNOSIS — E1065 Type 1 diabetes mellitus with hyperglycemia: Secondary | ICD-10-CM | POA: Diagnosis not present

## 2016-08-09 DIAGNOSIS — B1920 Unspecified viral hepatitis C without hepatic coma: Secondary | ICD-10-CM | POA: Diagnosis not present

## 2016-08-09 DIAGNOSIS — Z89411 Acquired absence of right great toe: Secondary | ICD-10-CM | POA: Diagnosis not present

## 2016-08-09 DIAGNOSIS — F329 Major depressive disorder, single episode, unspecified: Secondary | ICD-10-CM | POA: Diagnosis not present

## 2016-08-09 DIAGNOSIS — E1052 Type 1 diabetes mellitus with diabetic peripheral angiopathy with gangrene: Secondary | ICD-10-CM | POA: Diagnosis not present

## 2016-08-09 DIAGNOSIS — Z4781 Encounter for orthopedic aftercare following surgical amputation: Secondary | ICD-10-CM | POA: Diagnosis not present

## 2016-08-09 DIAGNOSIS — E103519 Type 1 diabetes mellitus with proliferative diabetic retinopathy with macular edema, unspecified eye: Secondary | ICD-10-CM | POA: Diagnosis not present

## 2016-08-09 DIAGNOSIS — I10 Essential (primary) hypertension: Secondary | ICD-10-CM | POA: Diagnosis not present

## 2016-08-09 DIAGNOSIS — L97401 Non-pressure chronic ulcer of unspecified heel and midfoot limited to breakdown of skin: Secondary | ICD-10-CM | POA: Diagnosis not present

## 2016-08-12 ENCOUNTER — Encounter: Payer: Self-pay | Admitting: Surgery

## 2016-08-12 ENCOUNTER — Ambulatory Visit (INDEPENDENT_AMBULATORY_CARE_PROVIDER_SITE_OTHER): Payer: Self-pay | Admitting: Surgery

## 2016-08-12 ENCOUNTER — Telehealth: Payer: Self-pay | Admitting: Internal Medicine

## 2016-08-12 VITALS — BP 137/66 | HR 86 | Temp 97.6°F | Resp 16 | Ht 67.0 in | Wt 162.0 lb

## 2016-08-12 DIAGNOSIS — E1065 Type 1 diabetes mellitus with hyperglycemia: Secondary | ICD-10-CM | POA: Diagnosis not present

## 2016-08-12 DIAGNOSIS — Z4781 Encounter for orthopedic aftercare following surgical amputation: Secondary | ICD-10-CM | POA: Diagnosis not present

## 2016-08-12 DIAGNOSIS — I10 Essential (primary) hypertension: Secondary | ICD-10-CM | POA: Diagnosis not present

## 2016-08-12 DIAGNOSIS — I7025 Atherosclerosis of native arteries of other extremities with ulceration: Secondary | ICD-10-CM

## 2016-08-12 DIAGNOSIS — E1052 Type 1 diabetes mellitus with diabetic peripheral angiopathy with gangrene: Secondary | ICD-10-CM | POA: Diagnosis not present

## 2016-08-12 DIAGNOSIS — Z89411 Acquired absence of right great toe: Secondary | ICD-10-CM | POA: Diagnosis not present

## 2016-08-12 DIAGNOSIS — E103519 Type 1 diabetes mellitus with proliferative diabetic retinopathy with macular edema, unspecified eye: Secondary | ICD-10-CM | POA: Diagnosis not present

## 2016-08-12 DIAGNOSIS — B1920 Unspecified viral hepatitis C without hepatic coma: Secondary | ICD-10-CM | POA: Diagnosis not present

## 2016-08-12 DIAGNOSIS — L97401 Non-pressure chronic ulcer of unspecified heel and midfoot limited to breakdown of skin: Secondary | ICD-10-CM | POA: Diagnosis not present

## 2016-08-12 DIAGNOSIS — F329 Major depressive disorder, single episode, unspecified: Secondary | ICD-10-CM | POA: Diagnosis not present

## 2016-08-12 MED ORDER — HYDROCODONE-IBUPROFEN 5-200 MG PO TABS: 1.0000 | ORAL_TABLET | Freq: Four times a day (QID) | ORAL | 0 refills | Status: DC | PRN

## 2016-08-12 MED ORDER — HYDROCODONE-IBUPROFEN 5-200 MG PO TABS: 2.0000 | ORAL_TABLET | Freq: Three times a day (TID) | ORAL | 0 refills | Status: DC | PRN

## 2016-08-12 NOTE — Addendum Note (Signed)
Addended by: Serafina Mitchell on: 08/12/2016 03:19 PM   Modules accepted: Orders

## 2016-08-12 NOTE — Progress Notes (Signed)
Patient name: [name] MRN: [id number] DOB: 1955-12-08 Sex: male  REASON FOR VISIT:     post op  HISTORY OF PRESENT ILLNESS:   [name] is a 61 y.o. male he was an inpatient consult for 11 2018 for a right great toe and heel wound.  He has poorly controlled type 1 diabetes.  He has been admitted for infection in the past.  He is hep C positive and does have a history of drug abuse.  1 07/26/2016 he underwent angiography and angioplasty of the right anterior tibial, right superficial femoral and popliteal artery as well as stenting of his right popliteal artery.  He also underwent amputation of his right great toe including the metatarsal head with wound VAC placement.  He is back today for follow-up.  He is seeing the wound care center.  He is not a candidate for hyperbarics according to the patient.  He is down to smoking 1-2 cigarettes a day.  He still has fluctuations in his blood sugar  CURRENT MEDICATIONS:    Current Outpatient Prescriptions  Medication Sig Dispense Refill  . aspirin EC 81 MG EC tablet Take 1 tablet (81 mg total) by mouth daily. 180 tablet 0  . clopidogrel (PLAVIX) 75 MG tablet Take 1 tablet (75 mg total) by mouth daily. 90 tablet 0  . docusate sodium (COLACE) 100 MG capsule Take 1 capsule (100 mg total) by mouth daily. 10 capsule 0  . feeding supplement, GLUCERNA SHAKE, (GLUCERNA SHAKE) LIQD Take 237 mLs by mouth 2 (two) times daily between meals. 21 Can 0  . folic acid (FOLVITE) 1 MG tablet Take 1 tablet (1 mg total) by mouth daily. 30 tablet 0  . gabapentin (NEURONTIN) 300 MG capsule Take 1 capsule (300 mg total) by mouth 3 (three) times daily. 180 capsule 0  . guaiFENesin-dextromethorphan (ROBITUSSIN DM) 100-10 MG/5ML syrup Take 15 mLs by mouth every 4 (four) hours as needed for cough. 118 mL 0  . insulin NPH Human (NOVOLIN N) 100 UNIT/ML injection Inject 0.25 mLs (25 Units total) into the skin 2 (two) times daily at 8 am  and 10 pm. (Patient taking differently: Inject 50 Units into the skin daily before breakfast. ) 10 mL 0  . linezolid (ZYVOX) 600 MG tablet Take 1 tablet (600 mg total) by mouth every 12 (twelve) hours. 36 tablet 0  . methocarbamol (ROBAXIN) 500 MG tablet Take 1 tablet (500 mg total) by mouth every 8 (eight) hours as needed for muscle spasms. 15 tablet 0  . nicotine (NICODERM CQ - DOSED IN MG/24 HOURS) 21 mg/24hr patch Place 1 patch (21 mg total) onto the skin daily. 28 patch 0  . thiamine 100 MG tablet Take 1 tablet (100 mg total) by mouth daily. 30 tablet 0  . HYDROcodone-acetaminophen (NORCO/VICODIN) 5-325 MG tablet Take 1 tablet by mouth every 6 (six) hours as needed for severe pain. (Patient not taking: Reported on 08/12/2016) 30 tablet 0   No current facility-administered medications for this visit.     REVIEW OF SYSTEMS:   [X]  denotes positive finding, [ ]  denotes negative finding Cardiac  Comments:  Chest pain or chest pressure:    Shortness of breath upon exertion:    Short of breath when lying flat:    Irregular heart rhythm:    Constitutional    Fever or chills:      PHYSICAL EXAM:   There were no vitals filed for this visit.  GENERAL: The patient is a  well-nourished male, in no acute distress. The vital signs are documented above. CARDIOVASCULAR: There is a regular rate and rhythm. PULMONARY: Non-labored respirations The right great toe amputation site is healing with pink granulation tissue.  There is still a dry eschar over his heel wound.  I cannot palpate a pedal pulse  STUDIES:   None   MEDICAL ISSUES:   Patient is scheduled to follow-up with me in one month.  At that time I will repeat an ABI and Doppler ultrasound of his intervention.  He will continue with wound care at the wound center.  He is also getting home health for dressing changes.  Annamarie Major, MD [facility] [phone] Pager [phone]

## 2016-08-12 NOTE — Addendum Note (Signed)
Addended by: Mena Goes on: 08/12/2016 03:22 PM   Modules accepted: Orders

## 2016-08-12 NOTE — Telephone Encounter (Signed)
Belenda Cruise from [facility] called requesting verbal orders to be able to visit the pt one time this week to help with shower set up.

## 2016-08-12 NOTE — Telephone Encounter (Signed)
Spoke with Belenda Cruise to give verbals per MD. Asked that she remind the pt to call and make an appt for follow-up.

## 2016-08-13 DIAGNOSIS — Z4781 Encounter for orthopedic aftercare following surgical amputation: Secondary | ICD-10-CM | POA: Diagnosis not present

## 2016-08-13 DIAGNOSIS — E103519 Type 1 diabetes mellitus with proliferative diabetic retinopathy with macular edema, unspecified eye: Secondary | ICD-10-CM | POA: Diagnosis not present

## 2016-08-13 DIAGNOSIS — F329 Major depressive disorder, single episode, unspecified: Secondary | ICD-10-CM | POA: Diagnosis not present

## 2016-08-13 DIAGNOSIS — L97401 Non-pressure chronic ulcer of unspecified heel and midfoot limited to breakdown of skin: Secondary | ICD-10-CM | POA: Diagnosis not present

## 2016-08-13 DIAGNOSIS — Z89411 Acquired absence of right great toe: Secondary | ICD-10-CM | POA: Diagnosis not present

## 2016-08-13 DIAGNOSIS — E1065 Type 1 diabetes mellitus with hyperglycemia: Secondary | ICD-10-CM | POA: Diagnosis not present

## 2016-08-13 DIAGNOSIS — I10 Essential (primary) hypertension: Secondary | ICD-10-CM | POA: Diagnosis not present

## 2016-08-13 DIAGNOSIS — E1052 Type 1 diabetes mellitus with diabetic peripheral angiopathy with gangrene: Secondary | ICD-10-CM | POA: Diagnosis not present

## 2016-08-13 DIAGNOSIS — B1920 Unspecified viral hepatitis C without hepatic coma: Secondary | ICD-10-CM | POA: Diagnosis not present

## 2016-08-13 NOTE — Addendum Note (Signed)
Addended by: Lianne Cure A on: 08/13/2016 09:12 AM   Modules accepted: Orders

## 2016-08-14 DIAGNOSIS — Z4781 Encounter for orthopedic aftercare following surgical amputation: Secondary | ICD-10-CM | POA: Diagnosis not present

## 2016-08-14 DIAGNOSIS — Z89411 Acquired absence of right great toe: Secondary | ICD-10-CM | POA: Diagnosis not present

## 2016-08-14 DIAGNOSIS — I10 Essential (primary) hypertension: Secondary | ICD-10-CM | POA: Diagnosis not present

## 2016-08-14 DIAGNOSIS — F329 Major depressive disorder, single episode, unspecified: Secondary | ICD-10-CM | POA: Diagnosis not present

## 2016-08-14 DIAGNOSIS — E103519 Type 1 diabetes mellitus with proliferative diabetic retinopathy with macular edema, unspecified eye: Secondary | ICD-10-CM | POA: Diagnosis not present

## 2016-08-14 DIAGNOSIS — B1920 Unspecified viral hepatitis C without hepatic coma: Secondary | ICD-10-CM | POA: Diagnosis not present

## 2016-08-14 DIAGNOSIS — E1065 Type 1 diabetes mellitus with hyperglycemia: Secondary | ICD-10-CM | POA: Diagnosis not present

## 2016-08-14 DIAGNOSIS — E1052 Type 1 diabetes mellitus with diabetic peripheral angiopathy with gangrene: Secondary | ICD-10-CM | POA: Diagnosis not present

## 2016-08-14 DIAGNOSIS — L97401 Non-pressure chronic ulcer of unspecified heel and midfoot limited to breakdown of skin: Secondary | ICD-10-CM | POA: Diagnosis not present

## 2016-08-15 ENCOUNTER — Ambulatory Visit: Payer: Medicare HMO | Admitting: Endocrinology

## 2016-08-15 DIAGNOSIS — E1065 Type 1 diabetes mellitus with hyperglycemia: Secondary | ICD-10-CM | POA: Diagnosis not present

## 2016-08-15 DIAGNOSIS — B1920 Unspecified viral hepatitis C without hepatic coma: Secondary | ICD-10-CM | POA: Diagnosis not present

## 2016-08-15 DIAGNOSIS — Z4781 Encounter for orthopedic aftercare following surgical amputation: Secondary | ICD-10-CM | POA: Diagnosis not present

## 2016-08-15 DIAGNOSIS — E103519 Type 1 diabetes mellitus with proliferative diabetic retinopathy with macular edema, unspecified eye: Secondary | ICD-10-CM | POA: Diagnosis not present

## 2016-08-15 DIAGNOSIS — Z89411 Acquired absence of right great toe: Secondary | ICD-10-CM | POA: Diagnosis not present

## 2016-08-15 DIAGNOSIS — L97401 Non-pressure chronic ulcer of unspecified heel and midfoot limited to breakdown of skin: Secondary | ICD-10-CM | POA: Diagnosis not present

## 2016-08-15 DIAGNOSIS — I10 Essential (primary) hypertension: Secondary | ICD-10-CM | POA: Diagnosis not present

## 2016-08-15 DIAGNOSIS — E1052 Type 1 diabetes mellitus with diabetic peripheral angiopathy with gangrene: Secondary | ICD-10-CM | POA: Diagnosis not present

## 2016-08-15 DIAGNOSIS — F329 Major depressive disorder, single episode, unspecified: Secondary | ICD-10-CM | POA: Diagnosis not present

## 2016-08-16 ENCOUNTER — Encounter (HOSPITAL_BASED_OUTPATIENT_CLINIC_OR_DEPARTMENT_OTHER): Payer: Medicare HMO | Attending: Internal Medicine

## 2016-08-16 ENCOUNTER — Telehealth: Payer: Self-pay | Admitting: Endocrinology

## 2016-08-16 DIAGNOSIS — B1920 Unspecified viral hepatitis C without hepatic coma: Secondary | ICD-10-CM | POA: Insufficient documentation

## 2016-08-16 DIAGNOSIS — E1052 Type 1 diabetes mellitus with diabetic peripheral angiopathy with gangrene: Secondary | ICD-10-CM | POA: Insufficient documentation

## 2016-08-16 DIAGNOSIS — I739 Peripheral vascular disease, unspecified: Secondary | ICD-10-CM | POA: Insufficient documentation

## 2016-08-16 DIAGNOSIS — E10621 Type 1 diabetes mellitus with foot ulcer: Secondary | ICD-10-CM | POA: Insufficient documentation

## 2016-08-16 DIAGNOSIS — I10 Essential (primary) hypertension: Secondary | ICD-10-CM | POA: Insufficient documentation

## 2016-08-16 DIAGNOSIS — L97518 Non-pressure chronic ulcer of other part of right foot with other specified severity: Secondary | ICD-10-CM | POA: Insufficient documentation

## 2016-08-16 DIAGNOSIS — J449 Chronic obstructive pulmonary disease, unspecified: Secondary | ICD-10-CM | POA: Insufficient documentation

## 2016-08-16 DIAGNOSIS — E1042 Type 1 diabetes mellitus with diabetic polyneuropathy: Secondary | ICD-10-CM | POA: Insufficient documentation

## 2016-08-16 DIAGNOSIS — F17210 Nicotine dependence, cigarettes, uncomplicated: Secondary | ICD-10-CM | POA: Insufficient documentation

## 2016-08-16 NOTE — Telephone Encounter (Signed)
[name] calling from [facility], just for a fyi to let you know that patient is taking [name] for pain.

## 2016-08-19 ENCOUNTER — Telehealth: Payer: Self-pay | Admitting: *Deleted

## 2016-08-19 DIAGNOSIS — F329 Major depressive disorder, single episode, unspecified: Secondary | ICD-10-CM | POA: Diagnosis not present

## 2016-08-19 DIAGNOSIS — E103519 Type 1 diabetes mellitus with proliferative diabetic retinopathy with macular edema, unspecified eye: Secondary | ICD-10-CM | POA: Diagnosis not present

## 2016-08-19 DIAGNOSIS — I10 Essential (primary) hypertension: Secondary | ICD-10-CM | POA: Diagnosis not present

## 2016-08-19 DIAGNOSIS — Z4781 Encounter for orthopedic aftercare following surgical amputation: Secondary | ICD-10-CM | POA: Diagnosis not present

## 2016-08-19 DIAGNOSIS — E1065 Type 1 diabetes mellitus with hyperglycemia: Secondary | ICD-10-CM | POA: Diagnosis not present

## 2016-08-19 DIAGNOSIS — L97401 Non-pressure chronic ulcer of unspecified heel and midfoot limited to breakdown of skin: Secondary | ICD-10-CM | POA: Diagnosis not present

## 2016-08-19 DIAGNOSIS — Z89411 Acquired absence of right great toe: Secondary | ICD-10-CM | POA: Diagnosis not present

## 2016-08-19 DIAGNOSIS — B1920 Unspecified viral hepatitis C without hepatic coma: Secondary | ICD-10-CM | POA: Diagnosis not present

## 2016-08-19 DIAGNOSIS — E1052 Type 1 diabetes mellitus with diabetic peripheral angiopathy with gangrene: Secondary | ICD-10-CM | POA: Diagnosis not present

## 2016-08-19 NOTE — Telephone Encounter (Signed)
[facility] called with a FYI on this patient: Complaining of constant LLE pain and is out of pain medication. He is also smoking marijuana for pain control. He is not elevating his leg. Per [name], he did not see Wound care last week but has an appt this week. His right Great toe amputation site is healing nicely, his left foot is slightly cooler than the right but he does have good capillary refill. Patient is afebrile. [name] states that [name] is living in an apartment with 2 other men and he is ambulating a lot, going to the grocery store etc.   I have tried to reach the patient at [phone] but have not been successful. He is scheduled to come in to our office 08-22-16 for ABIs as per Dr. Stephens Shire order on 08-12-16.

## 2016-08-20 ENCOUNTER — Telehealth: Payer: Self-pay

## 2016-08-20 DIAGNOSIS — Z4781 Encounter for orthopedic aftercare following surgical amputation: Secondary | ICD-10-CM | POA: Diagnosis not present

## 2016-08-20 DIAGNOSIS — Z89411 Acquired absence of right great toe: Secondary | ICD-10-CM | POA: Diagnosis not present

## 2016-08-20 DIAGNOSIS — E1052 Type 1 diabetes mellitus with diabetic peripheral angiopathy with gangrene: Secondary | ICD-10-CM | POA: Diagnosis not present

## 2016-08-20 DIAGNOSIS — B1920 Unspecified viral hepatitis C without hepatic coma: Secondary | ICD-10-CM | POA: Diagnosis not present

## 2016-08-20 DIAGNOSIS — E103519 Type 1 diabetes mellitus with proliferative diabetic retinopathy with macular edema, unspecified eye: Secondary | ICD-10-CM | POA: Diagnosis not present

## 2016-08-20 DIAGNOSIS — L97401 Non-pressure chronic ulcer of unspecified heel and midfoot limited to breakdown of skin: Secondary | ICD-10-CM | POA: Diagnosis not present

## 2016-08-20 DIAGNOSIS — E1065 Type 1 diabetes mellitus with hyperglycemia: Secondary | ICD-10-CM | POA: Diagnosis not present

## 2016-08-20 DIAGNOSIS — F329 Major depressive disorder, single episode, unspecified: Secondary | ICD-10-CM | POA: Diagnosis not present

## 2016-08-20 DIAGNOSIS — I10 Essential (primary) hypertension: Secondary | ICD-10-CM | POA: Diagnosis not present

## 2016-08-20 NOTE — Telephone Encounter (Signed)
Phone call made to pt. After report from [facility] Physical Therapist of "c/o significant pain left leg at rest."  The pt. Stated the pain in the left leg has increased over past 3 days.  Reported the nurse checked his pulse yesterday, and noted the pulse of left LE is weaker than the right LE. Stated the left foot is a little cooler than the right.  Denied any nonhealing sores of Left LE.  Stated the pain usually starts about 3:00 AM, and lasts a couple of hours.  stated it improves with being up and moving about. Reported he has appt. On 5/10, 5/18, and 5/21 at our office, and cannot afford a $40.00 copay each time.  Advised will have the Scheduler contact him re: possibility of rescheduling appts. on same day.  Agreed.

## 2016-08-20 NOTE — Telephone Encounter (Signed)
[facility] does not have any openings on 5/14 are we over booking or using NP ?

## 2016-08-21 ENCOUNTER — Encounter: Payer: Self-pay | Admitting: Family

## 2016-08-22 ENCOUNTER — Ambulatory Visit (HOSPITAL_COMMUNITY): Payer: Medicare HMO

## 2016-08-23 DIAGNOSIS — B1920 Unspecified viral hepatitis C without hepatic coma: Secondary | ICD-10-CM | POA: Diagnosis not present

## 2016-08-23 DIAGNOSIS — I10 Essential (primary) hypertension: Secondary | ICD-10-CM | POA: Diagnosis not present

## 2016-08-23 DIAGNOSIS — E1052 Type 1 diabetes mellitus with diabetic peripheral angiopathy with gangrene: Secondary | ICD-10-CM | POA: Diagnosis not present

## 2016-08-23 DIAGNOSIS — I739 Peripheral vascular disease, unspecified: Secondary | ICD-10-CM | POA: Diagnosis not present

## 2016-08-23 DIAGNOSIS — L97411 Non-pressure chronic ulcer of right heel and midfoot limited to breakdown of skin: Secondary | ICD-10-CM | POA: Diagnosis not present

## 2016-08-23 DIAGNOSIS — L97515 Non-pressure chronic ulcer of other part of right foot with muscle involvement without evidence of necrosis: Secondary | ICD-10-CM | POA: Diagnosis not present

## 2016-08-23 DIAGNOSIS — L97518 Non-pressure chronic ulcer of other part of right foot with other specified severity: Secondary | ICD-10-CM | POA: Diagnosis not present

## 2016-08-23 DIAGNOSIS — E10621 Type 1 diabetes mellitus with foot ulcer: Secondary | ICD-10-CM | POA: Diagnosis not present

## 2016-08-23 DIAGNOSIS — E1042 Type 1 diabetes mellitus with diabetic polyneuropathy: Secondary | ICD-10-CM | POA: Diagnosis not present

## 2016-08-23 DIAGNOSIS — F17210 Nicotine dependence, cigarettes, uncomplicated: Secondary | ICD-10-CM | POA: Diagnosis not present

## 2016-08-23 DIAGNOSIS — J449 Chronic obstructive pulmonary disease, unspecified: Secondary | ICD-10-CM | POA: Diagnosis not present

## 2016-08-26 ENCOUNTER — Encounter (HOSPITAL_COMMUNITY): Payer: Medicare HMO

## 2016-08-26 ENCOUNTER — Ambulatory Visit: Payer: Medicare HMO | Admitting: Family

## 2016-08-26 ENCOUNTER — Telehealth: Payer: Self-pay | Admitting: Surgery

## 2016-08-26 LAB — GLUCOSE, CAPILLARY: Glucose-Capillary: 188 mg/dL — ABNORMAL HIGH (ref 65–99)

## 2016-08-26 NOTE — Telephone Encounter (Signed)
Patient called because he missed his appts this morning due to transportation issues. Rescheduled appts to Tuesday 08/26/16 for 1pm for abi's and to see NP at 2:45pm. awt

## 2016-08-27 ENCOUNTER — Encounter: Payer: Self-pay | Admitting: Surgery

## 2016-08-27 ENCOUNTER — Encounter: Payer: Self-pay | Admitting: Family

## 2016-08-27 ENCOUNTER — Ambulatory Visit (INDEPENDENT_AMBULATORY_CARE_PROVIDER_SITE_OTHER): Payer: Self-pay | Admitting: Family

## 2016-08-27 ENCOUNTER — Ambulatory Visit (HOSPITAL_COMMUNITY)
Admission: RE | Admit: 2016-08-27 | Discharge: 2016-08-27 | Disposition: A | Discharge status: Home / Self Care | Payer: Medicare HMO | Source: Ambulatory Visit | Attending: Surgery | Admitting: Surgery

## 2016-08-27 VITALS — BP 139/70 | HR 82 | Temp 98.1°F | Resp 16 | Ht 72.0 in | Wt 164.0 lb

## 2016-08-27 DIAGNOSIS — Z89411 Acquired absence of right great toe: Secondary | ICD-10-CM | POA: Insufficient documentation

## 2016-08-27 DIAGNOSIS — B1920 Unspecified viral hepatitis C without hepatic coma: Secondary | ICD-10-CM | POA: Diagnosis not present

## 2016-08-27 DIAGNOSIS — R9439 Abnormal result of other cardiovascular function study: Secondary | ICD-10-CM | POA: Insufficient documentation

## 2016-08-27 DIAGNOSIS — I739 Peripheral vascular disease, unspecified: Secondary | ICD-10-CM | POA: Insufficient documentation

## 2016-08-27 DIAGNOSIS — Z9862 Peripheral vascular angioplasty status: Secondary | ICD-10-CM

## 2016-08-27 DIAGNOSIS — L97401 Non-pressure chronic ulcer of unspecified heel and midfoot limited to breakdown of skin: Secondary | ICD-10-CM | POA: Diagnosis not present

## 2016-08-27 DIAGNOSIS — Z9889 Other specified postprocedural states: Secondary | ICD-10-CM

## 2016-08-27 DIAGNOSIS — I7025 Atherosclerosis of native arteries of other extremities with ulceration: Secondary | ICD-10-CM

## 2016-08-27 DIAGNOSIS — I70261 Atherosclerosis of native arteries of extremities with gangrene, right leg: Secondary | ICD-10-CM | POA: Diagnosis not present

## 2016-08-27 DIAGNOSIS — E103519 Type 1 diabetes mellitus with proliferative diabetic retinopathy with macular edema, unspecified eye: Secondary | ICD-10-CM | POA: Diagnosis not present

## 2016-08-27 DIAGNOSIS — E1052 Type 1 diabetes mellitus with diabetic peripheral angiopathy with gangrene: Secondary | ICD-10-CM | POA: Diagnosis not present

## 2016-08-27 DIAGNOSIS — Z4781 Encounter for orthopedic aftercare following surgical amputation: Secondary | ICD-10-CM | POA: Diagnosis not present

## 2016-08-27 DIAGNOSIS — I10 Essential (primary) hypertension: Secondary | ICD-10-CM | POA: Diagnosis not present

## 2016-08-27 DIAGNOSIS — F17200 Nicotine dependence, unspecified, uncomplicated: Secondary | ICD-10-CM

## 2016-08-27 DIAGNOSIS — E1065 Type 1 diabetes mellitus with hyperglycemia: Secondary | ICD-10-CM | POA: Diagnosis not present

## 2016-08-27 DIAGNOSIS — E1151 Type 2 diabetes mellitus with diabetic peripheral angiopathy without gangrene: Secondary | ICD-10-CM

## 2016-08-27 DIAGNOSIS — F329 Major depressive disorder, single episode, unspecified: Secondary | ICD-10-CM | POA: Diagnosis not present

## 2016-08-27 NOTE — Progress Notes (Signed)
[facility] [place]   CC: Follow up peripheral artery occlusive disease  History of Present Illness [name] is a 61 y.o. male who was an inpatient consult for Dr. Trula Slade for a right great toe and heel wound.  He has poorly controlled type 1 diabetes.  He has been admitted for infection in the past.  He is hep C positive and does have a history of drug abuse.  On 07/26/2016 he underwent angiography and angioplasty of the right anterior tibial, right superficial femoral and popliteal artery as well as stenting of his right popliteal artery.  He also underwent amputation of his right great toe including the metatarsal head with wound VAC placement.  He is back today for follow-up.  He is seeing the wound care center.  He is not a candidate for hyperbarics according to the patient.  He is down to smoking 1-2 cigarettes a day.  He still has fluctuations in his blood sugar  Phone call made to pt on 08-20-16 by triage nurse after report from [facility] Physical Therapist of "c/o significant pain left leg at rest."  The pt. Stated the pain in the left leg has increased over past 3 days.  Reported the nurse checked his pulse yesterday, and noted the pulse of left LE is weaker than the right LE. Stated the left foot is a little cooler than the right.  Stated the pain usually starts about 3:00 AM, and lasts a couple of hours.  stated it improves with being up and moving about. Reported he has appt. On 5/10, 5/18, and 5/21 at our office, and cannot afford a $40.00 copay each time.   Pt requested prescription for narcotic analgesic; I advised him that he is several weeks postoperative, and the pain he is describing in both feet is of a chronic nature. He asked several times for narcotic prescription.  His medication list includes gabapentin and robaxin. He was prescribed hydrocodone two weeks ago and before that 3 weeks ago (review of records).   Pt states his right heel ulcer is improving    Pt states [facility] is seeing him and he attends wound care center at [facility]. He states he applies medi-honey to the right great toe amputation site and betadine to his right heel ulcer daily.    Pt Diabetic: Yes, 7.9 A1C on 07-28-16 (review of records) Pt smoker: yes  Pt meds include: Statin :No Betablocker: No ASA: Yes Other anticoagulants/antiplatelets: Plavix  Past Medical History:  Diagnosis Date  . DEPRESSION   . DIABETES MELLITUS, TYPE I   . DRUG ABUSE    pt should have NO controlled substances rx'ed  . Glaucoma   . HEPATITIS C    chronic  . Hypertension 02/18/2011  . 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 Vitiligo     Social History Social History  Substance Use Topics  . Smoking status: Current Every Day Smoker    Packs/day: 2.00    Types: Cigarettes  . Smokeless tobacco: Never Used  . Alcohol use 0.0 oz/week     Comment: occasional    Family History Family History  Problem Relation Age of Onset  . Diabetes Father   . Kidney disease Father   . Arthritis Mother   . Heart disease Mother        CAD    Past Surgical History:  Procedure Laterality Date  . ABDOMINAL AORTOGRAM W/LOWER EXTREMITY N/A 07/25/2016   Procedure: Abdominal Aortogram w/Bilateral Lower Extremity Runoff;  Surgeon: Conrad Curtis, MD;  Location: [facility];  Service: Cardiovascular;  Laterality: N/A;  . AMPUTATION TOE Right 07/26/2016   Procedure: AMPUTATION GREAT TOE;  Surgeon: Serafina Mitchell, MD;  Location: [facility];  Service: Vascular;  Laterality: Right;  . EYE SURGERY     retinal surgery x 2, right eye  . INSERTION OF ILIAC STENT Right 07/26/2016   Procedure: INSERTION OF RIGHT POPLITEAL STENT WITH BALLOON ANGIOPLASTY;  Surgeon: Serafina Mitchell, MD;  Location: [facility];  Service: Vascular;  Laterality: Right;  . LOWER EXTREMITY ANGIOGRAM Right 07/26/2016   Procedure: LOWER EXTREMITY ANGIOGRAM;  Surgeon: Serafina Mitchell, MD;  Location: [place];  Service: Vascular;  Laterality:  Right;  . PERIPHERAL VASCULAR BALLOON ANGIOPLASTY Right 07/26/2016   Procedure: PERIPHERAL VASCULAR BALLOON ANGIOPLASTY  RIGHT ANTERIOR TIBEAL ARTERY AND RIGHT SUPERFICIAL FEMORAL ARTERY;  Surgeon: Serafina Mitchell, MD;  Location: [place];  Service: Vascular;  Laterality: Right;    Allergies  Allergen Reactions  . Codeine Itching    Current Outpatient Prescriptions  Medication Sig Dispense Refill  . aspirin EC 81 MG EC tablet Take 1 tablet (81 mg total) by mouth daily. 180 tablet 0  . clopidogrel (PLAVIX) 75 MG tablet Take 1 tablet (75 mg total) by mouth daily. 90 tablet 0  . docusate sodium (COLACE) 100 MG capsule Take 1 capsule (100 mg total) by mouth daily. 10 capsule 0  . feeding supplement, GLUCERNA SHAKE, (GLUCERNA SHAKE) LIQD Take 237 mLs by mouth 2 (two) times daily between meals. 21 Can 0  . folic acid (FOLVITE) 1 MG tablet Take 1 tablet (1 mg total) by mouth daily. 30 tablet 0  . gabapentin (NEURONTIN) 300 MG capsule Take 1 capsule (300 mg total) by mouth 3 (three) times daily. 180 capsule 0  . guaiFENesin-dextromethorphan (ROBITUSSIN DM) 100-10 MG/5ML syrup Take 15 mLs by mouth every 4 (four) hours as needed for cough. 118 mL 0  . HYDROcodone-acetaminophen (NORCO/VICODIN) 5-325 MG tablet Take 1 tablet by mouth every 6 (six) hours as needed for severe pain. (Patient not taking: Reported on 08/12/2016) 30 tablet 0  . hydrocodone-ibuprofen (VICOPROFEN) 5-200 MG tablet Take 1 tablet by mouth every 6 (six) hours as needed for pain. 30 tablet 0  . insulin NPH Human (NOVOLIN N) 100 UNIT/ML injection Inject 0.25 mLs (25 Units total) into the skin 2 (two) times daily at 8 am and 10 pm. (Patient taking differently: Inject 50 Units into the skin daily before breakfast. ) 10 mL 0  . methocarbamol (ROBAXIN) 500 MG tablet Take 1 tablet (500 mg total) by mouth every 8 (eight) hours as needed for muscle spasms. 15 tablet 0  . nicotine (NICODERM CQ - DOSED IN MG/24 HOURS) 21 mg/24hr patch Place 1 patch  (21 mg total) onto the skin daily. 28 patch 0  . thiamine 100 MG tablet Take 1 tablet (100 mg total) by mouth daily. 30 tablet 0   No current facility-administered medications for this visit.     ROS: See HPI for pertinent positives and negatives.   Physical Examination  Vitals:   08/27/16 1417  BP: 139/70  Pulse: 82  Resp: 16  Temp: 98.1 F (36.7 C)  SpO2: 99%  Weight: 164 lb (74.4 kg)  Height: 6' (1.829 m)   Body mass index is 22.24 kg/m.                     VASCULAR EXAM: Extremities with ischemic changes: contracting  dry gangrene ulcer right heel; with open wounds: right great toe amputation site with some fibrinous exudate, no evidence of infection, no swelling, no erythema, no purulence.                                                                                                            LE Pulses Right Left       FEMORAL   palpable   palpable        POPLITEAL  not palpable   not palpable       POSTERIOR TIBIAL  not palpable   not palpable        DORSALIS PEDIS      ANTERIOR TIBIAL 1+ palpable  not palpable    Abdomen: soft, NT, no palpable masses. Skin: no rashes, see Extremities. Musculoskeletal: no muscle wasting or atrophy.  Neurologic: A&O X 3; Appropriate Affect ; SENSATION: normal; MOTOR FUNCTION:  moving all extremities equally, motor strength 5/5 throughout. Speech is fluent/normal. CN 2-12 intact.     Non-Invasive Vascular Imaging: DATE: 08/27/2016 ABI:  RIGHT: 0.94 with biphasic waveforms (was 0.61 on 07-23-16) LEFT: 0.72 PT, [place] DP, Waveforms: PT: monophasic, DP: biphasic (was [place] on 07-23-16); TBI: 0.30 (was 0.53 on 07-23-16) Significant improvement in right ABI since the last vascular procedure. Left ABI is [place] and 0.72.    ASSESSMENT: [name] is a 61 y.o. male who is s/p angiography and angioplasty of the right anterior tibial, right superficial femoral and popliteal artery as well as stenting of his right popliteal artery on 07-26-16.   He also underwent amputation of his right great toe including the metatarsal head with wound VAC placement.  Significant improvement in right ABI since the last vascular procedure on 07-26-16. Left ABI is [place] and 0.72.  Right heel gangrenous ulcer is contracting, according to pt, since the 07-26-16 procedure.  Right great toe amputation site is healing slowly with the help of HH and wound care clinic, despite patient's continued smoking. His DM is almost in control with his last A1C of 7.9 last month.   I advised pt to work closely with his provider that helps him manage his DM to get his DM under as good control as possible to help heal his right foot wounds and to minimize his risk of further DM complications.    PLAN:  Based on the patient's vascular studies and examination, pt will rfollow up with Dr. Trula Slade for right foot wound check at Dr. Stephens Shire soonest availability.  Continue HH and wound care clinic.   The patient was counseled re smoking cessation and given several free resources re smoking cessation.  I discussed in depth with the patient the nature of atherosclerosis, and emphasized the importance of maximal medical management including strict control of blood pressure, blood glucose, and lipid levels, obtaining regular exercise, and cessation of smoking.  The patient is aware that without maximal medical management the underlying atherosclerotic disease process will progress, limiting the benefit of any interventions.  The patient was given information about PAD including  signs, symptoms, treatment, what symptoms should prompt the patient to seek immediate medical care, and risk reduction measures to take.  Clemon Chambers, RN, MSN, FNP-C [facility] Phone: [phone]  Clinic MD: Early  08/27/16 2:46 PM

## 2016-08-27 NOTE — Patient Instructions (Signed)
Peripheral Vascular Disease Peripheral vascular disease (PVD) is a disease of the blood vessels that are not part of your heart and brain. A simple term for PVD is poor circulation. In most cases, PVD narrows the blood vessels that carry blood from your heart to the rest of your body. This can result in a decreased supply of blood to your arms, legs, and internal organs, like your stomach or kidneys. However, it most often affects a person's lower legs and feet. There are two types of PVD.  Organic PVD. This is the more common type. It is caused by damage to the structure of blood vessels.  Functional PVD. This is caused by conditions that make blood vessels contract and tighten (spasm). Without treatment, PVD tends to get worse over time. PVD can also lead to acute ischemic limb. This is when an arm or limb suddenly has trouble getting enough blood. This is a medical emergency. Follow these instructions at home:  Take medicines only as told by your doctor.  Do not use any tobacco products, including cigarettes, chewing tobacco, or electronic cigarettes. If you need help quitting, ask your doctor.  Lose weight if you are overweight, and maintain a healthy weight as told by your doctor.  Eat a diet that is low in fat and cholesterol. If you need help, ask your doctor.  Exercise regularly. Ask your doctor for some good activities for you.  Take good care of your feet.  Wear comfortable shoes that fit well.  Check your feet often for any cuts or sores. Contact a doctor if:  You have cramps in your legs while walking.  You have leg pain when you are at rest.  You have coldness in a leg or foot.  Your skin changes.  You are unable to get or have an erection (erectile dysfunction).  You have cuts or sores on your feet that are not healing. Get help right away if:  Your arm or leg turns cold and blue.  Your arms or legs become red, warm, swollen, painful, or numb.  You have  chest pain or trouble breathing.  You suddenly have weakness in your face, arm, or leg.  You become very confused or you cannot speak.  You suddenly have a very bad headache.  You suddenly cannot see. This information is not intended to replace advice given to you by your health care provider. Make sure you discuss any questions you have with your health care provider. Document Released: 06/26/2009 Document Revised: 09/07/2015 Document Reviewed: 09/09/2013 Elsevier Interactive Patient Education  2017 Elsevier Inc.      Steps to Quit Smoking Smoking tobacco can be bad for your health. It can also affect almost every organ in your body. Smoking puts you and people around you at risk for many serious long-lasting (chronic) diseases. Quitting smoking is hard, but it is one of the best things that you can do for your health. It is never too late to quit. What are the benefits of quitting smoking? When you quit smoking, you lower your risk for getting serious diseases and conditions. They can include:  Lung cancer or lung disease.  Heart disease.  Stroke.  Heart attack.  Not being able to have children (infertility).  Weak bones (osteoporosis) and broken bones (fractures). If you have coughing, wheezing, and shortness of breath, those symptoms may get better when you quit. You may also get sick less often. If you are pregnant, quitting smoking can help to lower your chances   of having a baby of low birth weight. What can I do to help me quit smoking? Talk with your doctor about what can help you quit smoking. Some things you can do (strategies) include:  Quitting smoking totally, instead of slowly cutting back how much you smoke over a period of time.  Going to in-person counseling. You are more likely to quit if you go to many counseling sessions.  Using resources and support systems, such as:  Online chats with a counselor.  Phone quitlines.  Printed self-help  materials.  Support groups or group counseling.  Text messaging programs.  Mobile phone apps or applications.  Taking medicines. Some of these medicines may have nicotine in them. If you are pregnant or breastfeeding, do not take any medicines to quit smoking unless your doctor says it is okay. Talk with your doctor about counseling or other things that can help you. Talk with your doctor about using more than one strategy at the same time, such as taking medicines while you are also going to in-person counseling. This can help make quitting easier. What things can I do to make it easier to quit? Quitting smoking might feel very hard at first, but there is a lot that you can do to make it easier. Take these steps:  Talk to your family and friends. Ask them to support and encourage you.  Call phone quitlines, reach out to support groups, or work with a counselor.  Ask people who smoke to not smoke around you.  Avoid places that make you want (trigger) to smoke, such as:  Bars.  Parties.  Smoke-break areas at work.  Spend time with people who do not smoke.  Lower the stress in your life. Stress can make you want to smoke. Try these things to help your stress:  Getting regular exercise.  Deep-breathing exercises.  Yoga.  Meditating.  Doing a body scan. To do this, close your eyes, focus on one area of your body at a time from head to toe, and notice which parts of your body are tense. Try to relax the muscles in those areas.  Download or buy apps on your mobile phone or tablet that can help you stick to your quit plan. There are many free apps, such as QuitGuide from the CDC (Centers for Disease Control and Prevention). You can find more support from smokefree.gov and other websites. This information is not intended to replace advice given to you by your health care provider. Make sure you discuss any questions you have with your health care provider. Document Released:  01/26/2009 Document Revised: 11/28/2015 Document Reviewed: 08/16/2014 Elsevier Interactive Patient Education  2017 Elsevier Inc.  

## 2016-08-28 ENCOUNTER — Encounter: Payer: Self-pay | Admitting: Internal Medicine

## 2016-08-28 ENCOUNTER — Ambulatory Visit (INDEPENDENT_AMBULATORY_CARE_PROVIDER_SITE_OTHER): Payer: Medicare HMO | Admitting: Internal Medicine

## 2016-08-28 VITALS — BP 183/74 | HR 86 | Temp 97.7°F | Ht 72.0 in | Wt 163.0 lb

## 2016-08-28 DIAGNOSIS — M869 Osteomyelitis, unspecified: Secondary | ICD-10-CM | POA: Diagnosis not present

## 2016-08-28 DIAGNOSIS — F1910 Other psychoactive substance abuse, uncomplicated: Secondary | ICD-10-CM

## 2016-08-28 DIAGNOSIS — B182 Chronic viral hepatitis C: Secondary | ICD-10-CM

## 2016-08-28 DIAGNOSIS — L97412 Non-pressure chronic ulcer of right heel and midfoot with fat layer exposed: Secondary | ICD-10-CM

## 2016-08-28 LAB — COMPLETE METABOLIC PANEL WITH GFR
ALT: 26 U/L (ref 9–46)
AST: 24 U/L (ref 10–35)
Albumin: 3.4 g/dL — ABNORMAL LOW (ref 3.6–5.1)
Alkaline Phosphatase: 101 U/L (ref 40–115)
BUN: 18 mg/dL (ref 7–25)
CHLORIDE: 104 mmol/L (ref 98–110)
CO2: 25 mmol/L (ref 20–31)
CREATININE: 1.04 mg/dL (ref 0.70–1.33)
Calcium: 9.4 mg/dL (ref 8.6–10.3)
GFR, Est Non African American: 78 mL/min (ref >=60)
GLUCOSE: 155 mg/dL — AB (ref 65–99)
Potassium: 4.4 mmol/L (ref 3.5–5.3)
Sodium: 138 mmol/L (ref 135–146)
Total Bilirubin: 0.3 mg/dL (ref 0.2–1.2)
Total Protein: 6.2 g/dL (ref 6.1–8.1)

## 2016-08-28 LAB — CBC WITH DIFFERENTIAL/PLATELET
BASOS ABS: 89 {cells}/uL (ref 0–200)
Basophils Relative: 1 %
EOS ABS: 445 {cells}/uL (ref 15–500)
Eosinophils Relative: 5 %
HEMATOCRIT: 30.6 % — AB (ref 38.5–50.0)
Hemoglobin: 10.1 g/dL — ABNORMAL LOW (ref 13.2–17.1)
LYMPHS PCT: 18 %
Lymphs Abs: 1602 cells/uL (ref 850–3900)
MCH: 30.8 pg (ref 27.0–33.0)
MCHC: 33 g/dL (ref 32.0–36.0)
MCV: 93.3 fL (ref 80.0–100.0)
MONO ABS: 623 {cells}/uL (ref 200–950)
MPV: 9 fL (ref 7.5–12.5)
Monocytes Relative: 7 %
Neutro Abs: 6141 cells/uL (ref 1500–7800)
Neutrophils Relative %: 69 %
Platelets: 385 10*3/uL (ref 140–400)
RBC: 3.28 MIL/uL — ABNORMAL LOW (ref 4.20–5.80)
RDW: 15.5 % — AB (ref 11.0–15.0)
WBC: 8.9 10*3/uL (ref 3.8–10.8)

## 2016-08-28 NOTE — Progress Notes (Signed)
   Subjective:    Patient ID: [name], male    DOB: May 28, 1955, 61 y.o.   MRN: [id number]  HPI Here for hsfu. Was seen in the hospital for right great toe ulcer and right heel ulcer and CT c/w osteomyelitis of the toe, which was amputated by Dr. Trula Slade and did have a revascularization study as well. He completed zyvox for 3 weeks and has had no issues.  He continues to go to wound care for his heel and is dry and closing up.  Compared to the picture during the hospital it has improved considerably.  He also has active hepatitis C and RNA positive during the hospitalization.  He was told about it 30 years ago but has never been treated.  He had a liver biopsy in the past but does not remember the result.     Review of Systems  Constitutional: Negative for fatigue.  Gastrointestinal: Negative for diarrhea.  Skin: Negative for rash.  Neurological: Negative for dizziness.       Objective:   Physical Exam  Constitutional: He appears well-developed and well-nourished. No distress.  Eyes: No scleral icterus.  Cardiovascular: Normal rate, regular rhythm and normal heart sounds.   No murmur heard. Pulmonary/Chest: Effort normal and breath sounds normal.  Musculoskeletal:  Toe with silvadene and no pus or drainage Heel with dried area and improved  Lymphadenopathy:    He has no cervical adenopathy.  Skin: No rash noted.   SH: remote IVDU, recent cocaine use but none for about 1 month      Assessment & Plan:

## 2016-08-28 NOTE — Assessment & Plan Note (Signed)
Doing well;  No further antibiotics indicated

## 2016-08-28 NOTE — Assessment & Plan Note (Signed)
Will check his labs, genotype and fibrosure and order elastography and have him rtc 4 weeks to consider treatment options.

## 2016-08-28 NOTE — Assessment & Plan Note (Signed)
counceled on need to remain drug free

## 2016-08-28 NOTE — Assessment & Plan Note (Signed)
Now closing and doing well.  No concerns for osteomyelitis at this time.

## 2016-08-29 ENCOUNTER — Telehealth: Payer: Self-pay | Admitting: *Deleted

## 2016-08-29 DIAGNOSIS — L97401 Non-pressure chronic ulcer of unspecified heel and midfoot limited to breakdown of skin: Secondary | ICD-10-CM | POA: Diagnosis not present

## 2016-08-29 DIAGNOSIS — Z4781 Encounter for orthopedic aftercare following surgical amputation: Secondary | ICD-10-CM | POA: Diagnosis not present

## 2016-08-29 DIAGNOSIS — E103519 Type 1 diabetes mellitus with proliferative diabetic retinopathy with macular edema, unspecified eye: Secondary | ICD-10-CM | POA: Diagnosis not present

## 2016-08-29 DIAGNOSIS — E1065 Type 1 diabetes mellitus with hyperglycemia: Secondary | ICD-10-CM | POA: Diagnosis not present

## 2016-08-29 DIAGNOSIS — F329 Major depressive disorder, single episode, unspecified: Secondary | ICD-10-CM | POA: Diagnosis not present

## 2016-08-29 DIAGNOSIS — I10 Essential (primary) hypertension: Secondary | ICD-10-CM | POA: Diagnosis not present

## 2016-08-29 DIAGNOSIS — B1920 Unspecified viral hepatitis C without hepatic coma: Secondary | ICD-10-CM | POA: Diagnosis not present

## 2016-08-29 DIAGNOSIS — E1052 Type 1 diabetes mellitus with diabetic peripheral angiopathy with gangrene: Secondary | ICD-10-CM | POA: Diagnosis not present

## 2016-08-29 DIAGNOSIS — Z89411 Acquired absence of right great toe: Secondary | ICD-10-CM | POA: Diagnosis not present

## 2016-08-29 LAB — PROTIME-INR
INR: 0.9
Prothrombin Time: 9.9 s (ref 9.0–11.5)

## 2016-08-29 LAB — HEPATITIS B SURFACE ANTIGEN: Hepatitis B Surface Ag: NEGATIVE

## 2016-08-29 LAB — HEPATITIS A ANTIBODY, TOTAL: HEP A TOTAL AB: REACTIVE — AB

## 2016-08-29 LAB — HEPATITIS B CORE ANTIBODY, TOTAL: Hep B Core Total Ab: NONREACTIVE

## 2016-08-29 LAB — HEPATITIS B SURFACE ANTIBODY,QUALITATIVE: Hep B S Ab: NEGATIVE

## 2016-08-29 NOTE — Telephone Encounter (Signed)
Called patient and informed him of appt for ultrasound at [facility] on 09/11/16 at 10:45 AM. Nothing to eat or drink for seven hours prior to test. I did give patient a bus pass for transportation. He has [employer]. Myrtis Hopping CMA

## 2016-08-30 ENCOUNTER — Ambulatory Visit (HOSPITAL_COMMUNITY)
Admission: RE | Admit: 2016-08-30 | Discharge: 2016-08-30 | Disposition: A | Discharge status: Home / Self Care | Payer: Medicare HMO | Source: Ambulatory Visit | Attending: Surgery | Admitting: Surgery

## 2016-08-30 DIAGNOSIS — I70261 Atherosclerosis of native arteries of extremities with gangrene, right leg: Secondary | ICD-10-CM | POA: Diagnosis not present

## 2016-08-31 LAB — HEPATITIS C GENOTYPE: HCV GENOTYPE: 2

## 2016-08-31 LAB — HCV RNA, QN PCR RFLX GENO, LIPA
HCV RNA, PCR, QN: 119000 [IU]/mL — AB
HCV RNA, PCR, QN: 5.08 {Log_IU}/mL — AB

## 2016-09-02 ENCOUNTER — Other Ambulatory Visit (HOSPITAL_COMMUNITY)
Admission: RE | Admit: 2016-09-02 | Discharge: 2016-09-02 | Disposition: A | Discharge status: Home / Self Care | Payer: Medicare HMO | Source: Other Acute Inpatient Hospital | Attending: Internal Medicine | Admitting: Internal Medicine

## 2016-09-02 ENCOUNTER — Encounter: Payer: Self-pay | Admitting: Surgery

## 2016-09-02 ENCOUNTER — Ambulatory Visit: Payer: Medicare HMO | Admitting: Family

## 2016-09-02 ENCOUNTER — Ambulatory Visit (INDEPENDENT_AMBULATORY_CARE_PROVIDER_SITE_OTHER): Payer: Self-pay | Admitting: Surgery

## 2016-09-02 VITALS — BP 160/74 | HR 82 | Temp 97.4°F | Resp 16 | Ht 72.0 in | Wt 165.0 lb

## 2016-09-02 DIAGNOSIS — L97518 Non-pressure chronic ulcer of other part of right foot with other specified severity: Secondary | ICD-10-CM | POA: Diagnosis not present

## 2016-09-02 DIAGNOSIS — E11628 Type 2 diabetes mellitus with other skin complications: Secondary | ICD-10-CM

## 2016-09-02 DIAGNOSIS — I10 Essential (primary) hypertension: Secondary | ICD-10-CM | POA: Diagnosis not present

## 2016-09-02 DIAGNOSIS — E1052 Type 1 diabetes mellitus with diabetic peripheral angiopathy with gangrene: Secondary | ICD-10-CM | POA: Diagnosis not present

## 2016-09-02 DIAGNOSIS — B1920 Unspecified viral hepatitis C without hepatic coma: Secondary | ICD-10-CM | POA: Diagnosis not present

## 2016-09-02 DIAGNOSIS — E10621 Type 1 diabetes mellitus with foot ulcer: Secondary | ICD-10-CM | POA: Insufficient documentation

## 2016-09-02 DIAGNOSIS — L089 Local infection of the skin and subcutaneous tissue, unspecified: Secondary | ICD-10-CM

## 2016-09-02 DIAGNOSIS — I739 Peripheral vascular disease, unspecified: Secondary | ICD-10-CM | POA: Diagnosis not present

## 2016-09-02 DIAGNOSIS — I7025 Atherosclerosis of native arteries of other extremities with ulceration: Secondary | ICD-10-CM

## 2016-09-02 DIAGNOSIS — J449 Chronic obstructive pulmonary disease, unspecified: Secondary | ICD-10-CM | POA: Diagnosis not present

## 2016-09-02 DIAGNOSIS — F17210 Nicotine dependence, cigarettes, uncomplicated: Secondary | ICD-10-CM | POA: Diagnosis not present

## 2016-09-02 DIAGNOSIS — E11621 Type 2 diabetes mellitus with foot ulcer: Secondary | ICD-10-CM | POA: Diagnosis not present

## 2016-09-02 DIAGNOSIS — E1042 Type 1 diabetes mellitus with diabetic polyneuropathy: Secondary | ICD-10-CM | POA: Diagnosis not present

## 2016-09-02 DIAGNOSIS — L97412 Non-pressure chronic ulcer of right heel and midfoot with fat layer exposed: Secondary | ICD-10-CM | POA: Diagnosis not present

## 2016-09-02 DIAGNOSIS — T8189XA Other complications of procedures, not elsewhere classified, initial encounter: Secondary | ICD-10-CM | POA: Diagnosis not present

## 2016-09-02 LAB — LIVER FIBROSIS, FIBROTEST-ACTITEST
ALPHA-2-MACROGLOBULIN: 367 mg/dL — AB (ref 106–279)
ALT: 26 U/L (ref 9–46)
Apolipoprotein A1: 196 mg/dL — ABNORMAL HIGH (ref 94–176)
Bilirubin: 0.2 mg/dL (ref 0.2–1.2)
FIBROSIS SCORE: 0.24
GGT: 81 U/L (ref 3–85)
Haptoglobin: 302 mg/dL — ABNORMAL HIGH (ref 43–212)
NECROINFLAMMAT ACT SCORE: 0.11
REFERENCE ID: 1952777

## 2016-09-02 MED ORDER — SULFAMETHOXAZOLE-TRIMETHOPRIM 400-80 MG PO TABS: 1.0000 | ORAL_TABLET | Freq: Two times a day (BID) | ORAL | 0 refills | Status: AC

## 2016-09-02 NOTE — Progress Notes (Signed)
Vascular and Vein Specialist of [facility]  Patient name: [name] MRN: [id number] DOB: [birth year] Sex: male   REASON FOR VISIT:    Follow up  HISOTRY OF PRESENT ILLNESS:     [name] is a 62 y.o. male.  He was an inpatient consult on 4-11 2018 for a right great toe and heel wound.  He has poorly controlled type 1 diabetes.  He has been admitted for infection in the past.  He is hep C positive and does have a history of drug abuse.  On 07/26/2016 he underwent angiography and angioplasty of the right anterior tibial, right superficial femoral and popliteal artery as well as stenting of his right popliteal artery.  He also underwent amputation of his right great toe including the metatarsal head with wound VAC placement.  He is back today for follow-up.  He is seeing the wound care center.  He is not a candidate for hyperbarics according to the patient.  He is down to smoking 1-2 cigarettes a day.  He still has fluctuations in his blood sugar   PAST MEDICAL HISTORY:   Past Medical History:  Diagnosis Date  . DEPRESSION   . DIABETES MELLITUS, TYPE I   . DRUG ABUSE    pt should have NO controlled substances rx'ed  . Glaucoma   . HEPATITIS C    chronic  . Hypertension 02/18/2011  . 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 Vitiligo      FAMILY HISTORY:   Family History  Problem Relation Age of Onset  . Diabetes Father   . Kidney disease Father   . Arthritis Mother   . Heart disease Mother        CAD    SOCIAL HISTORY:   Social History  Substance Use Topics  . Smoking status: Current Every Day Smoker    Packs/day: 0.25    Types: Cigarettes  . Smokeless tobacco: Never Used     Comment: cutting back  . Alcohol use No     Comment: 08/14/14 none     ALLERGIES:   Allergies  Allergen Reactions  . Codeine Itching     CURRENT MEDICATIONS:   Current Outpatient Prescriptions  Medication Sig Dispense Refill  .  aspirin EC 81 MG EC tablet Take 1 tablet (81 mg total) by mouth daily. 180 tablet 0  . clopidogrel (PLAVIX) 75 MG tablet Take 1 tablet (75 mg total) by mouth daily. 90 tablet 0  . feeding supplement, GLUCERNA SHAKE, (GLUCERNA SHAKE) LIQD Take 237 mLs by mouth 2 (two) times daily between meals. 21 Can 0  . folic acid (FOLVITE) 1 MG tablet Take 1 tablet (1 mg total) by mouth daily. 30 tablet 0  . gabapentin (NEURONTIN) 300 MG capsule Take 1 capsule (300 mg total) by mouth 3 (three) times daily. 180 capsule 0  . HYDROcodone-acetaminophen (NORCO/VICODIN) 5-325 MG tablet Take 1 tablet by mouth every 6 (six) hours as needed for severe pain. 30 tablet 0  . hydrocodone-ibuprofen (VICOPROFEN) 5-200 MG tablet Take 1 tablet by mouth every 6 (six) hours as needed for pain. 30 tablet 0  . insulin NPH Human (NOVOLIN N) 100 UNIT/ML injection Inject 0.25 mLs (25 Units total) into the skin 2 (two) times daily at 8 am and 10 pm. (Patient taking differently: Inject 50 Units into the skin daily before breakfast. ) 10 mL 0  . thiamine 100 MG tablet Take 1 tablet (100 mg total) by mouth daily. 30 tablet 0  No current facility-administered medications for this visit.     REVIEW OF SYSTEMS:   [X]  denotes positive finding, [ ]  denotes negative finding Cardiac  Comments:  Chest pain or chest pressure:    Shortness of breath upon exertion:    Short of breath when lying flat:    Irregular heart rhythm:        Vascular    Pain in calf, thigh, or hip brought on by ambulation:    Pain in feet at night that wakes you up from your sleep:     Blood clot in your veins:    Leg swelling:  x       Pulmonary    Oxygen at home:    Productive cough:     Wheezing:         Neurologic    Sudden weakness in arms or legs:     Sudden numbness in arms or legs:     Sudden onset of difficulty speaking or slurred speech:    Temporary loss of vision in one eye:     Problems with dizziness:         Gastrointestinal    Blood in  stool:     Vomited blood:         Genitourinary    Burning when urinating:     Blood in urine:        Psychiatric    Major depression:         Hematologic    Bleeding problems:    Problems with blood clotting too easily:        Skin    Rashes or ulcers: x       Constitutional    Fever or chills:      PHYSICAL EXAM:   Vitals:   09/02/16 1038  BP: (!) 160/74  Pulse: 82  Resp: 16  Temp: 97.4 F (36.3 C)  TempSrc: Oral  SpO2: 99%  Weight: 165 lb (74.8 kg)  Height: 6' (1.829 m)    GENERAL: The patient is a well-nourished male, in no acute distress. The vital signs are documented above. CARDIAC: There is a regular rate and rhythm.  PULMONARY: Non-labored respirations MUSCULOSKELETAL: There are no major deformities or cyanosis. NEUROLOGIC: No focal weakness or paresthesias are detected. SKIN:  Edema and erythema of the right great toe amputation site.  Mild serous drainage. PSYCHIATRIC: The patient has a normal affect.  STUDIES:   I have reviewed the ultrasound shows no significant stenosis within the right leg  MEDICAL ISSUES:   I discussed with the patient that I am concerned about his amputation site.  I think he may require a more proximal amputation removing more bone and soft tissue.  I am placing him on Bactrim for 10 days.  He will continue to see the wound center.  I will have him follow up again with me in 6 weeks    Annamarie Major, MD [facility] of [facility] [phone] Pager [phone]

## 2016-09-03 ENCOUNTER — Telehealth: Payer: Self-pay | Admitting: Endocrinology

## 2016-09-03 DIAGNOSIS — L97401 Non-pressure chronic ulcer of unspecified heel and midfoot limited to breakdown of skin: Secondary | ICD-10-CM | POA: Diagnosis not present

## 2016-09-03 DIAGNOSIS — E1052 Type 1 diabetes mellitus with diabetic peripheral angiopathy with gangrene: Secondary | ICD-10-CM | POA: Diagnosis not present

## 2016-09-03 DIAGNOSIS — Z89411 Acquired absence of right great toe: Secondary | ICD-10-CM | POA: Diagnosis not present

## 2016-09-03 DIAGNOSIS — F329 Major depressive disorder, single episode, unspecified: Secondary | ICD-10-CM | POA: Diagnosis not present

## 2016-09-03 DIAGNOSIS — Z4781 Encounter for orthopedic aftercare following surgical amputation: Secondary | ICD-10-CM | POA: Diagnosis not present

## 2016-09-03 DIAGNOSIS — E1065 Type 1 diabetes mellitus with hyperglycemia: Secondary | ICD-10-CM | POA: Diagnosis not present

## 2016-09-03 DIAGNOSIS — I10 Essential (primary) hypertension: Secondary | ICD-10-CM | POA: Diagnosis not present

## 2016-09-03 DIAGNOSIS — B1920 Unspecified viral hepatitis C without hepatic coma: Secondary | ICD-10-CM | POA: Diagnosis not present

## 2016-09-03 DIAGNOSIS — E103519 Type 1 diabetes mellitus with proliferative diabetic retinopathy with macular edema, unspecified eye: Secondary | ICD-10-CM | POA: Diagnosis not present

## 2016-09-03 NOTE — Telephone Encounter (Signed)
I contacted [name] with [facility]. She stated [name] is having a hard time. She stated his wound care specialist has placed him on a antibiotic for his foot wound. She also stated the [name] is out of his blood test strips and could did not know what the blood sugar is. [name] has been feeling nauseated but does not have transportation at this time time to come in for an appointment. [name] just wanted to let us know about this and see if we could do anything for the [name]. Please advise, Thanks!

## 2016-09-03 NOTE — Telephone Encounter (Signed)
Please refill test strips prn. Pt needs a pcp for other problems.

## 2016-09-03 NOTE — Telephone Encounter (Signed)
Cindy with [facility] called, she is very concerned with the patient's health, she really wants to talk to a nurse. CB# [phone]

## 2016-09-03 NOTE — Telephone Encounter (Signed)
I contacted Cindy with [facility]. She stated patient is having a hard time. She stated his wound care specialist has placed him on a antibiotic for his foot wound. She also stated the patient is out of his blood test strips and could did not know what the blood sugar is. Patient has been feeling nauseated but does not have transportation at this time time to come in for an appointment. Jenny Reichmann just wanted to let us know about this and see if we could do anything for the patient. Please advise, Thanks!

## 2016-09-03 NOTE — Telephone Encounter (Signed)
[facility] called stated patient was put on antibiotics for his wound on his rt foot.  He is in need of test strips for his Accu check guide meter but do not have money to buy them, wondering if we had samples. An also want to know what is correct dosage he is supposed to take concerning the insulin NPH Human (NOVOLIN N) 100 UNIT/ML injection  Please advise [phone]

## 2016-09-04 ENCOUNTER — Other Ambulatory Visit: Payer: Self-pay

## 2016-09-05 NOTE — Telephone Encounter (Signed)
I spoke with Jenny Reichmann from [facility] after I was unable to reach the patient by phone- I was attempting to find out what meter the patient is using so that I can order test strips for him- Jenny Reichmann stated she believed his meter was the Accu-chek nano but she was not sure- she also stated the patient did not want to pay for strips because of financial difficulties so I recommended he come to the office to pick up a meter- I will try to reach the patient again later today

## 2016-09-06 DIAGNOSIS — L97412 Non-pressure chronic ulcer of right heel and midfoot with fat layer exposed: Secondary | ICD-10-CM | POA: Diagnosis not present

## 2016-09-06 DIAGNOSIS — T8189XA Other complications of procedures, not elsewhere classified, initial encounter: Secondary | ICD-10-CM | POA: Diagnosis not present

## 2016-09-06 DIAGNOSIS — R110 Nausea: Secondary | ICD-10-CM | POA: Diagnosis not present

## 2016-09-06 DIAGNOSIS — J449 Chronic obstructive pulmonary disease, unspecified: Secondary | ICD-10-CM | POA: Diagnosis not present

## 2016-09-06 DIAGNOSIS — L97519 Non-pressure chronic ulcer of other part of right foot with unspecified severity: Secondary | ICD-10-CM | POA: Diagnosis not present

## 2016-09-06 DIAGNOSIS — E1042 Type 1 diabetes mellitus with diabetic polyneuropathy: Secondary | ICD-10-CM | POA: Diagnosis not present

## 2016-09-06 DIAGNOSIS — B1920 Unspecified viral hepatitis C without hepatic coma: Secondary | ICD-10-CM | POA: Diagnosis not present

## 2016-09-06 DIAGNOSIS — E10649 Type 1 diabetes mellitus with hypoglycemia without coma: Secondary | ICD-10-CM | POA: Diagnosis not present

## 2016-09-06 DIAGNOSIS — L97518 Non-pressure chronic ulcer of other part of right foot with other specified severity: Secondary | ICD-10-CM | POA: Diagnosis not present

## 2016-09-06 DIAGNOSIS — F17210 Nicotine dependence, cigarettes, uncomplicated: Secondary | ICD-10-CM | POA: Diagnosis not present

## 2016-09-06 DIAGNOSIS — B9620 Unspecified Escherichia coli [E. coli] as the cause of diseases classified elsewhere: Secondary | ICD-10-CM | POA: Diagnosis not present

## 2016-09-06 DIAGNOSIS — I10 Essential (primary) hypertension: Secondary | ICD-10-CM | POA: Diagnosis not present

## 2016-09-06 DIAGNOSIS — I739 Peripheral vascular disease, unspecified: Secondary | ICD-10-CM | POA: Diagnosis not present

## 2016-09-06 DIAGNOSIS — E1052 Type 1 diabetes mellitus with diabetic peripheral angiopathy with gangrene: Secondary | ICD-10-CM | POA: Diagnosis not present

## 2016-09-06 DIAGNOSIS — E10621 Type 1 diabetes mellitus with foot ulcer: Secondary | ICD-10-CM | POA: Diagnosis not present

## 2016-09-06 LAB — AEROBIC CULTURE W GRAM STAIN (SUPERFICIAL SPECIMEN)

## 2016-09-06 LAB — AEROBIC CULTURE  (SUPERFICIAL SPECIMEN)

## 2016-09-06 LAB — GLUCOSE, CAPILLARY
GLUCOSE-CAPILLARY: 33 mg/dL — AB (ref 65–99)
GLUCOSE-CAPILLARY: 68 mg/dL (ref 65–99)

## 2016-09-09 DIAGNOSIS — F329 Major depressive disorder, single episode, unspecified: Secondary | ICD-10-CM | POA: Diagnosis not present

## 2016-09-09 DIAGNOSIS — E1065 Type 1 diabetes mellitus with hyperglycemia: Secondary | ICD-10-CM | POA: Diagnosis not present

## 2016-09-09 DIAGNOSIS — E1052 Type 1 diabetes mellitus with diabetic peripheral angiopathy with gangrene: Secondary | ICD-10-CM | POA: Diagnosis not present

## 2016-09-09 DIAGNOSIS — I10 Essential (primary) hypertension: Secondary | ICD-10-CM | POA: Diagnosis not present

## 2016-09-09 DIAGNOSIS — Z89411 Acquired absence of right great toe: Secondary | ICD-10-CM | POA: Diagnosis not present

## 2016-09-09 DIAGNOSIS — Z4781 Encounter for orthopedic aftercare following surgical amputation: Secondary | ICD-10-CM | POA: Diagnosis not present

## 2016-09-09 DIAGNOSIS — B1920 Unspecified viral hepatitis C without hepatic coma: Secondary | ICD-10-CM | POA: Diagnosis not present

## 2016-09-09 DIAGNOSIS — E103519 Type 1 diabetes mellitus with proliferative diabetic retinopathy with macular edema, unspecified eye: Secondary | ICD-10-CM | POA: Diagnosis not present

## 2016-09-09 DIAGNOSIS — L97401 Non-pressure chronic ulcer of unspecified heel and midfoot limited to breakdown of skin: Secondary | ICD-10-CM | POA: Diagnosis not present

## 2016-09-11 ENCOUNTER — Ambulatory Visit (HOSPITAL_COMMUNITY): Payer: Medicare HMO

## 2016-09-13 ENCOUNTER — Encounter (HOSPITAL_BASED_OUTPATIENT_CLINIC_OR_DEPARTMENT_OTHER): Payer: Medicare HMO | Attending: Internal Medicine

## 2016-09-13 DIAGNOSIS — L97512 Non-pressure chronic ulcer of other part of right foot with fat layer exposed: Secondary | ICD-10-CM | POA: Insufficient documentation

## 2016-09-13 DIAGNOSIS — L97312 Non-pressure chronic ulcer of right ankle with fat layer exposed: Secondary | ICD-10-CM | POA: Insufficient documentation

## 2016-09-13 DIAGNOSIS — E10621 Type 1 diabetes mellitus with foot ulcer: Secondary | ICD-10-CM | POA: Insufficient documentation

## 2016-09-13 DIAGNOSIS — Z89431 Acquired absence of right foot: Secondary | ICD-10-CM | POA: Insufficient documentation

## 2016-09-13 DIAGNOSIS — E1042 Type 1 diabetes mellitus with diabetic polyneuropathy: Secondary | ICD-10-CM | POA: Insufficient documentation

## 2016-09-13 DIAGNOSIS — E1052 Type 1 diabetes mellitus with diabetic peripheral angiopathy with gangrene: Secondary | ICD-10-CM | POA: Insufficient documentation

## 2016-09-16 DIAGNOSIS — Z89411 Acquired absence of right great toe: Secondary | ICD-10-CM | POA: Diagnosis not present

## 2016-09-16 DIAGNOSIS — E103519 Type 1 diabetes mellitus with proliferative diabetic retinopathy with macular edema, unspecified eye: Secondary | ICD-10-CM | POA: Diagnosis not present

## 2016-09-16 DIAGNOSIS — E10621 Type 1 diabetes mellitus with foot ulcer: Secondary | ICD-10-CM | POA: Diagnosis not present

## 2016-09-16 DIAGNOSIS — L97412 Non-pressure chronic ulcer of right heel and midfoot with fat layer exposed: Secondary | ICD-10-CM | POA: Diagnosis not present

## 2016-09-16 DIAGNOSIS — E1042 Type 1 diabetes mellitus with diabetic polyneuropathy: Secondary | ICD-10-CM | POA: Diagnosis not present

## 2016-09-16 DIAGNOSIS — F329 Major depressive disorder, single episode, unspecified: Secondary | ICD-10-CM | POA: Diagnosis not present

## 2016-09-16 DIAGNOSIS — Z89431 Acquired absence of right foot: Secondary | ICD-10-CM | POA: Diagnosis not present

## 2016-09-16 DIAGNOSIS — B1920 Unspecified viral hepatitis C without hepatic coma: Secondary | ICD-10-CM | POA: Diagnosis not present

## 2016-09-16 DIAGNOSIS — L97401 Non-pressure chronic ulcer of unspecified heel and midfoot limited to breakdown of skin: Secondary | ICD-10-CM | POA: Diagnosis not present

## 2016-09-16 DIAGNOSIS — L97515 Non-pressure chronic ulcer of other part of right foot with muscle involvement without evidence of necrosis: Secondary | ICD-10-CM | POA: Diagnosis not present

## 2016-09-16 DIAGNOSIS — L97512 Non-pressure chronic ulcer of other part of right foot with fat layer exposed: Secondary | ICD-10-CM | POA: Diagnosis not present

## 2016-09-16 DIAGNOSIS — Z4781 Encounter for orthopedic aftercare following surgical amputation: Secondary | ICD-10-CM | POA: Diagnosis not present

## 2016-09-16 DIAGNOSIS — L97312 Non-pressure chronic ulcer of right ankle with fat layer exposed: Secondary | ICD-10-CM | POA: Diagnosis not present

## 2016-09-16 DIAGNOSIS — E1065 Type 1 diabetes mellitus with hyperglycemia: Secondary | ICD-10-CM | POA: Diagnosis not present

## 2016-09-16 DIAGNOSIS — I10 Essential (primary) hypertension: Secondary | ICD-10-CM | POA: Diagnosis not present

## 2016-09-16 DIAGNOSIS — E1052 Type 1 diabetes mellitus with diabetic peripheral angiopathy with gangrene: Secondary | ICD-10-CM | POA: Diagnosis not present

## 2016-09-16 NOTE — Anesthesia Postprocedure Evaluation (Signed)
Anesthesia Post Note  Patient: [name]  Procedure(s) Performed: Procedure(s) (LRB): LOWER EXTREMITY ANGIOGRAM (Right) AMPUTATION GREAT TOE (Right) INSERTION OF RIGHT POPLITEAL STENT WITH BALLOON ANGIOPLASTY (Right) PERIPHERAL VASCULAR BALLOON ANGIOPLASTY  RIGHT ANTERIOR TIBEAL ARTERY AND RIGHT SUPERFICIAL FEMORAL ARTERY (Right)     Anesthesia Post Evaluation  Last Vitals:  Vitals:   07/30/16 0537 07/30/16 0915  BP: (!) 162/78 (!) 168/70  Pulse: 76 86  Resp: 20 19  Temp: 36.9 C 36.5 C    Last Pain:  Vitals:   07/30/16 0918  TempSrc:   PainSc: 5                  Antone Summons S

## 2016-09-16 NOTE — Addendum Note (Signed)
Addendum  created 09/16/16 1317 by Myrtie Soman, MD   Sign clinical note

## 2016-09-17 ENCOUNTER — Telehealth: Payer: Self-pay | Admitting: General Practice

## 2016-09-17 NOTE — Telephone Encounter (Signed)
Wanted you aware. Thank you!

## 2016-09-17 NOTE — Telephone Encounter (Signed)
Nurse Blanca Friend w/ [facility] calling to relay that the patient, [name], will be discharge because of non-compliance. He will not be re-certified.  Please call number attached if needed.   Thank you,  -LL

## 2016-09-23 DIAGNOSIS — L97512 Non-pressure chronic ulcer of other part of right foot with fat layer exposed: Secondary | ICD-10-CM | POA: Diagnosis not present

## 2016-09-23 DIAGNOSIS — E1052 Type 1 diabetes mellitus with diabetic peripheral angiopathy with gangrene: Secondary | ICD-10-CM | POA: Diagnosis not present

## 2016-09-23 DIAGNOSIS — E10621 Type 1 diabetes mellitus with foot ulcer: Secondary | ICD-10-CM | POA: Diagnosis not present

## 2016-09-23 DIAGNOSIS — L97515 Non-pressure chronic ulcer of other part of right foot with muscle involvement without evidence of necrosis: Secondary | ICD-10-CM | POA: Diagnosis not present

## 2016-09-23 DIAGNOSIS — L97312 Non-pressure chronic ulcer of right ankle with fat layer exposed: Secondary | ICD-10-CM | POA: Diagnosis not present

## 2016-09-23 DIAGNOSIS — E1042 Type 1 diabetes mellitus with diabetic polyneuropathy: Secondary | ICD-10-CM | POA: Diagnosis not present

## 2016-09-23 DIAGNOSIS — Z89431 Acquired absence of right foot: Secondary | ICD-10-CM | POA: Diagnosis not present

## 2016-09-24 DIAGNOSIS — L97401 Non-pressure chronic ulcer of unspecified heel and midfoot limited to breakdown of skin: Secondary | ICD-10-CM | POA: Diagnosis not present

## 2016-09-24 DIAGNOSIS — E1052 Type 1 diabetes mellitus with diabetic peripheral angiopathy with gangrene: Secondary | ICD-10-CM | POA: Diagnosis not present

## 2016-09-24 DIAGNOSIS — Z4781 Encounter for orthopedic aftercare following surgical amputation: Secondary | ICD-10-CM | POA: Diagnosis not present

## 2016-09-24 DIAGNOSIS — Z89411 Acquired absence of right great toe: Secondary | ICD-10-CM | POA: Diagnosis not present

## 2016-09-24 DIAGNOSIS — E103519 Type 1 diabetes mellitus with proliferative diabetic retinopathy with macular edema, unspecified eye: Secondary | ICD-10-CM | POA: Diagnosis not present

## 2016-09-24 DIAGNOSIS — I10 Essential (primary) hypertension: Secondary | ICD-10-CM | POA: Diagnosis not present

## 2016-09-24 DIAGNOSIS — E1065 Type 1 diabetes mellitus with hyperglycemia: Secondary | ICD-10-CM | POA: Diagnosis not present

## 2016-09-24 DIAGNOSIS — B1920 Unspecified viral hepatitis C without hepatic coma: Secondary | ICD-10-CM | POA: Diagnosis not present

## 2016-09-24 DIAGNOSIS — F329 Major depressive disorder, single episode, unspecified: Secondary | ICD-10-CM | POA: Diagnosis not present

## 2016-09-25 ENCOUNTER — Encounter: Payer: Self-pay | Admitting: Surgery

## 2016-09-26 ENCOUNTER — Telehealth: Payer: Self-pay | Admitting: *Deleted

## 2016-09-26 NOTE — Telephone Encounter (Signed)
Patient missed his ultrasound (his son did not pick him up), wanted number to reschedule.  RN gave him the number [phone]), confirmed upcoming appointment 6/15. Landis Gandy, RN

## 2016-09-30 ENCOUNTER — Ambulatory Visit (HOSPITAL_COMMUNITY)
Admission: RE | Admit: 2016-09-30 | Discharge: 2016-09-30 | Disposition: A | Discharge status: Home / Self Care | Payer: Medicare HMO | Source: Ambulatory Visit | Attending: Internal Medicine | Admitting: Internal Medicine

## 2016-09-30 DIAGNOSIS — K7689 Other specified diseases of liver: Secondary | ICD-10-CM | POA: Diagnosis not present

## 2016-09-30 DIAGNOSIS — L97512 Non-pressure chronic ulcer of other part of right foot with fat layer exposed: Secondary | ICD-10-CM | POA: Diagnosis not present

## 2016-09-30 DIAGNOSIS — E11621 Type 2 diabetes mellitus with foot ulcer: Secondary | ICD-10-CM | POA: Diagnosis not present

## 2016-09-30 DIAGNOSIS — L97312 Non-pressure chronic ulcer of right ankle with fat layer exposed: Secondary | ICD-10-CM | POA: Diagnosis not present

## 2016-09-30 DIAGNOSIS — E10621 Type 1 diabetes mellitus with foot ulcer: Secondary | ICD-10-CM | POA: Diagnosis not present

## 2016-09-30 DIAGNOSIS — R160 Hepatomegaly, not elsewhere classified: Secondary | ICD-10-CM | POA: Insufficient documentation

## 2016-09-30 DIAGNOSIS — B182 Chronic viral hepatitis C: Secondary | ICD-10-CM | POA: Insufficient documentation

## 2016-09-30 DIAGNOSIS — L97516 Non-pressure chronic ulcer of other part of right foot with bone involvement without evidence of necrosis: Secondary | ICD-10-CM | POA: Diagnosis not present

## 2016-09-30 DIAGNOSIS — E1042 Type 1 diabetes mellitus with diabetic polyneuropathy: Secondary | ICD-10-CM | POA: Diagnosis not present

## 2016-09-30 DIAGNOSIS — E1052 Type 1 diabetes mellitus with diabetic peripheral angiopathy with gangrene: Secondary | ICD-10-CM | POA: Diagnosis not present

## 2016-09-30 DIAGNOSIS — L97412 Non-pressure chronic ulcer of right heel and midfoot with fat layer exposed: Secondary | ICD-10-CM | POA: Diagnosis not present

## 2016-09-30 DIAGNOSIS — Z89431 Acquired absence of right foot: Secondary | ICD-10-CM | POA: Diagnosis not present

## 2016-09-30 LAB — GLUCOSE, CAPILLARY: Glucose-Capillary: 132 mg/dL — ABNORMAL HIGH (ref 65–99)

## 2016-10-01 ENCOUNTER — Ambulatory Visit (INDEPENDENT_AMBULATORY_CARE_PROVIDER_SITE_OTHER): Payer: Medicare HMO | Admitting: Internal Medicine

## 2016-10-01 ENCOUNTER — Encounter: Payer: Self-pay | Admitting: Internal Medicine

## 2016-10-01 VITALS — BP 146/62 | HR 74 | Temp 98.4°F | Ht 72.0 in | Wt 161.0 lb

## 2016-10-01 DIAGNOSIS — D1800 Hemangioma unspecified site: Secondary | ICD-10-CM

## 2016-10-01 DIAGNOSIS — Z720 Tobacco use: Secondary | ICD-10-CM | POA: Diagnosis not present

## 2016-10-01 DIAGNOSIS — B182 Chronic viral hepatitis C: Secondary | ICD-10-CM

## 2016-10-01 MED ORDER — SOFOSBUVIR-VELPATASVIR 400-100 MG PO TABS: 1.0000 | ORAL_TABLET | Freq: Every day | ORAL | 2 refills | Status: DC

## 2016-10-01 NOTE — Progress Notes (Signed)
   Subjective:    Patient ID: [name], male    DOB: June 01, 1955, 61 y.o.   MRN: [id number]  HPI Here for follow up of HCV> I saw him for his toe infection and followed by Dr. Trula Slade.  Has healed up and followed by Dr. Dellia Nims of wound care.  At his last visit I did hepatitis C-related labs and he has genotype 2 and viral load of 45,300.   Elastography with F0/1.  History of a hemangioma on MRI.  This has enlarged some since remote MRI.    Review of Systems  Constitutional: Negative for fatigue.  Gastrointestinal: Negative for diarrhea.  Skin: Negative for rash.  Neurological: Negative for dizziness.       Objective:   Physical Exam  Constitutional: He appears well-developed and well-nourished. No distress.  Eyes: No scleral icterus.  Cardiovascular: Normal rate, regular rhythm and normal heart sounds.   No murmur heard. Lymphadenopathy:    He has no cervical adenopathy.  Skin: No rash noted.    SH: occasional alcohol      Assessment & Plan:

## 2016-10-01 NOTE — Patient Instructions (Signed)
Date 10/01/16  Dear Mr. Pitre, As discussed in the [facility], your hepatitis C therapy will include the following medications:    sofosbuvir 400 mg/velpatasvir 100 mg (Epclusa) oral daily  Please note that ALL MEDICATIONS WILL START ON THE SAME DATE for a total of 12 weeks. ---------------------------------------------------------------- Your HCV Treatment Start Date: TBA   Your HCV genotype: 2    Liver Fibrosis: F0/1   ---------------------------------------------------------------- YOUR PHARMACY CONTACT (depending on your insurance):   [facility] [place],  [address] Phone: [phone] Hours: Monday to Friday 7:30 am to 6:00 pm   Please always contact your pharmacy at least 3-4 business days before you run out of medications to ensure your next month's medication is ready or 1 week prior to running out if you receive it by mail.  Remember, each prescription is for 28 days. ---------------------------------------------------------------- GENERAL NOTES REGARDING YOUR HEPATITIS C MEDICATION:  SOFOSBUVIR (SOVALDI or EPCLUSA): - Sofosbuvir 400 mg tablet is taken daily with OR without food. - The sofosbuvir tablets are yellow. - The Epclusa tablets are pink, diamond-shaped - The tablets should be stored at room temperature. - The most common side effects with sofosbuvir or Epclusa include:      1. Fatigue      2. Headache      3. Nausea      4. Diarrhea      5. Insomnia  - Acid reducing agents such as H2 blockers (ie. Pepcid (famotidine), Zantac (ranitidine), Tagamet (cimetidine), Axid (nizatidine) and proton pump inhibitors (ie. Prilosec (omeprazole), Protonix (pantoprazole), Nexium (esomeprazole), or Aciphex (rabeprazole)) can decrease effectiveness of Harvoni. Do not take until you have discussed with a health care provider.    -Antacids that contain magnesium and/or aluminum hydroxide (ie. Milk of Magensia, Rolaids, Gaviscon, Maalox,  Mylanta, an dArthritis Pain Formula)can reduce absorption of sofosbuvir, so take them at least 4 hours before or after Harvoni.  -Calcium carbonate (calcium supplements or antacids such as Tums, Caltrate, Os-Cal)needs to be taken at least 4 hours hours before or after sofosbuvir.  -St. John's wort or any products that contain St. John's wort like some herbal supplements  Please inform the office prior to starting any of these medications.   Please note that this only lists the most common side effects and is NOT a comprehensive list of the potential side effects of these medications. For more information, please review the drug information sheets that come with your medication package from the pharmacy.  ---------------------------------------------------------------- GENERAL HELPFUL HINTS ON HCV THERAPY: 1. No alcohol. 2. Stay well-hydrated 3. Notify the [facility] of any changes in your other over-the-counter/herbal or prescription medications. 4. If you miss a dose of your medication, take the missed dose as soon as you remember. Return to your regular time/dose schedule the next day.  5.  Do not stop taking your medications without first talking with your healthcare provider. 6.  You may take Tylenol (acetaminophen), as long as the dose is less than 2000 mg (OR no more than 4 tablets of the Tylenol Extra Strengths 500mg  tablet) in 24 hours. 7. You will follow up with our clinic pharmacist initially after starting the medication to monitor for any possible side effects 8. You will get labs once during treatment, soon after treatment completion and again 6 months or more after treatment completion to verify the virus is completely gone.   Scharlene Gloss, [place] [facility] [place]  [place], [place]  [address] [phone]

## 2016-10-02 DIAGNOSIS — D1800 Hemangioma unspecified site: Secondary | ICD-10-CM | POA: Insufficient documentation

## 2016-10-02 NOTE — Assessment & Plan Note (Signed)
I will try to get him on Epclusa for 12 weeks.  Follow up after starting medication

## 2016-10-02 NOTE — Assessment & Plan Note (Signed)
Counseled on not using.

## 2016-10-02 NOTE — Assessment & Plan Note (Signed)
Some increase in size since remote previous MRI.  They have recommended to repeat the MRI but the patient did not want to do it.

## 2016-10-03 MED FILL — EPCLUSA 400 MG-100 MG TAB: 400-100 | 28 days supply | Qty: 28 | Fill #0

## 2016-10-07 ENCOUNTER — Ambulatory Visit (INDEPENDENT_AMBULATORY_CARE_PROVIDER_SITE_OTHER): Payer: Medicare HMO | Admitting: Endocrinology

## 2016-10-07 ENCOUNTER — Ambulatory Visit (INDEPENDENT_AMBULATORY_CARE_PROVIDER_SITE_OTHER): Payer: Self-pay | Admitting: Surgery

## 2016-10-07 ENCOUNTER — Encounter: Payer: Self-pay | Admitting: Endocrinology

## 2016-10-07 ENCOUNTER — Telehealth: Payer: Self-pay | Admitting: Endocrinology

## 2016-10-07 ENCOUNTER — Encounter: Payer: Self-pay | Admitting: Surgery

## 2016-10-07 VITALS — BP 134/69 | HR 75 | Temp 98.2°F | Resp 16 | Ht 72.0 in | Wt 163.0 lb

## 2016-10-07 VITALS — BP 140/84 | HR 71 | Ht 72.0 in | Wt 163.0 lb

## 2016-10-07 DIAGNOSIS — IMO0002 Reserved for concepts with insufficient information to code with codable children: Secondary | ICD-10-CM

## 2016-10-07 DIAGNOSIS — E1065 Type 1 diabetes mellitus with hyperglycemia: Secondary | ICD-10-CM

## 2016-10-07 DIAGNOSIS — N181 Chronic kidney disease, stage 1: Secondary | ICD-10-CM

## 2016-10-07 DIAGNOSIS — E10621 Type 1 diabetes mellitus with foot ulcer: Secondary | ICD-10-CM

## 2016-10-07 DIAGNOSIS — E1022 Type 1 diabetes mellitus with diabetic chronic kidney disease: Secondary | ICD-10-CM | POA: Diagnosis not present

## 2016-10-07 DIAGNOSIS — L97515 Non-pressure chronic ulcer of other part of right foot with muscle involvement without evidence of necrosis: Secondary | ICD-10-CM

## 2016-10-07 LAB — POCT GLYCOSYLATED HEMOGLOBIN (HGB A1C): HEMOGLOBIN A1C: 7

## 2016-10-07 MED ORDER — INSULIN NPH (HUMAN) (ISOPHANE) 100 UNIT/ML ~~LOC~~ SUSP: 45.0000 [IU] | Freq: Every day | SUBCUTANEOUS | 11 refills | Status: DC

## 2016-10-07 NOTE — Telephone Encounter (Signed)
[facility] called to check the status of the plan of care that was sent over via fax, their office has not heard back anything yet. Call Sebastian at the number provided to advise. Fax back to [phone].

## 2016-10-07 NOTE — Progress Notes (Signed)
Subjective:    Patient ID: [name], male    DOB: [birth year], 61 y.o.   MRN: [id number]  HPI Pt returns for f/u of diabetes mellitus: DM type: 1 Dx'ed: 6599 Complications: polyneuropathy, nephropathy and retinopathy.  Therapy: insulin since dx.   DKA: never Severe hypoglycemia: multiple episodes (last was early 2018).   Pancreatitis: never Other: he has done better with a qd insulin regimen; he was changed from lantus to levemir, due to mild hypoglycemia in the early hours of the morning; therapy limited by ongoing use of heroin, EtOH, and cocaine; he says he cannot afford insulin analogs.  Interval history: He takes 50 units qam.  He does not check cbg's, but he has frequent fasting hypoglycemia.  He recently had right foot partial amputation.   Past Medical History:  Diagnosis Date  . DEPRESSION   . DIABETES MELLITUS, TYPE I   . DRUG ABUSE    pt should have NO controlled substances rx'ed  . Glaucoma   . HEPATITIS C    chronic  . Hypertension 02/18/2011  . 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 Vitiligo     Past Surgical History:  Procedure Laterality Date  . ABDOMINAL AORTOGRAM W/LOWER EXTREMITY N/A 07/25/2016   Procedure: Abdominal Aortogram w/Bilateral Lower Extremity Runoff;  Surgeon: Conrad Fernan [place], MD;  Location: [facility];  Service: Cardiovascular;  Laterality: N/A;  . AMPUTATION TOE Right 07/26/2016   Procedure: AMPUTATION GREAT TOE;  Surgeon: Serafina Mitchell, MD;  Location: [place];  Service: Vascular;  Laterality: Right;  . EYE SURGERY     retinal surgery x 2, right eye  . INSERTION OF ILIAC STENT Right 07/26/2016   Procedure: INSERTION OF RIGHT POPLITEAL STENT WITH BALLOON ANGIOPLASTY;  Surgeon: Serafina Mitchell, MD;  Location: [place];  Service: Vascular;  Laterality: Right;  . LOWER EXTREMITY ANGIOGRAM Right 07/26/2016   Procedure: LOWER EXTREMITY ANGIOGRAM;  Surgeon: Serafina Mitchell, MD;  Location: [place];  Service: Vascular;  Laterality: Right;  .  PERIPHERAL VASCULAR BALLOON ANGIOPLASTY Right 07/26/2016   Procedure: PERIPHERAL VASCULAR BALLOON ANGIOPLASTY  RIGHT ANTERIOR TIBEAL ARTERY AND RIGHT SUPERFICIAL FEMORAL ARTERY;  Surgeon: Serafina Mitchell, MD;  Location: [facility];  Service: Vascular;  Laterality: Right;    Social History   Social History  . Marital status: Single    Spouse name: N/A  . Number of children: N/A  . Years of education: N/A   Occupational History  .  Unemployed    disabled   Social History Main Topics  . Smoking status: Current Every Day Smoker    Packs/day: 0.25    Types: Cigarettes  . Smokeless tobacco: Never Used     Comment: cutting back  . Alcohol use No     Comment: 08/14/14 none  . Drug use: Yes    Types: "Crack" cocaine, Marijuana, Heroin     Comment: s/p rehab x 6, occasional drug use (per pt last  use was 06/2015)  . Sexual activity: Not Currently   Other Topics Concern  . Not on file   Social History Narrative   Lives in boarding house/homeless   Ongoing occ drug use-crack   Single, disabled, prev mental health drug counselor.     Current Outpatient Prescriptions on File Prior to Visit  Medication Sig Dispense Refill  . aspirin EC 81 MG EC tablet Take 1 tablet (81 mg total) by mouth daily. 180 tablet 0  . Sofosbuvir-Velpatasvir (EPCLUSA) 400-100 MG TABS Take 1  tablet by mouth daily. (Patient taking differently: Take 1 tablet by mouth daily. ) 28 tablet 2  . clopidogrel (PLAVIX) 75 MG tablet Take 1 tablet (75 mg total) by mouth daily. (Patient not taking: Reported on 10/07/2016) 90 tablet 0  . folic acid (FOLVITE) 1 MG tablet Take 1 tablet (1 mg total) by mouth daily. (Patient not taking: Reported on 10/07/2016) 30 tablet 0  . Ledipasvir-Sofosbuvir (HARVONI PO) Take by mouth.     No current facility-administered medications on file prior to visit.     Allergies  Allergen Reactions  . Codeine Itching    Family History  Problem Relation Age of Onset  . Diabetes Father   . Kidney  disease Father   . Arthritis Mother   . Heart disease Mother        CAD    BP 140/84   Pulse 71   Ht 6' (1.829 m)   Wt 163 lb (73.9 kg)   SpO2 99%   BMI 22.11 kg/m    Review of Systems Denies LOC.      Objective:   Physical Exam VITAL SIGNS:  See vs page GENERAL: no distress Pulses: left dorsalis pedis intact.  MSK: no deformity of the left foot.   CV: no left leg edema.   Skin: normal color and temp on the left foot.  Neuro: sensation is intact to touch on the left foot, but severely decreased from normal. Ext: right foot is bandaged.There is onychomycosis of the left foot toenails.      A1c=7.0%    Assessment & Plan:  Type 1 DM, with PAD: overcontrolled, given this regimen, which does match insulin to her changing needs throughout the day.   Patient Instructions  check your blood sugar twice a day.  vary the time of day when you check, between before the 3 meals, and at bedtime.  also check if you have symptoms of your blood sugar being too high or too low.  please keep a record of the readings and bring it to your next appointment here (or you can bring the meter itself).  You can write it on any piece of paper.  please call us sooner if your blood sugar goes below 70, or if you have a lot of readings over 200. Please reduce the NPH insulin to 45 units each morning, and:  On this type of insulin schedule, you should eat meals on a regular schedule.  If a meal is missed or significantly delayed, your blood sugar could go low.   Here are some phone numbers to call to get a new meter and strips.   Please come back for a follow-up appointment in 3 months.

## 2016-10-07 NOTE — Patient Instructions (Signed)
check your blood sugar twice a day.  vary the time of day when you check, between before the 3 meals, and at bedtime.  also check if you have symptoms of your blood sugar being too high or too low.  please keep a record of the readings and bring it to your next appointment here (or you can bring the meter itself).  You can write it on any piece of paper.  please call us sooner if your blood sugar goes below 70, or if you have a lot of readings over 200. Please reduce the NPH insulin to 45 units each morning, and:  On this type of insulin schedule, you should eat meals on a regular schedule.  If a meal is missed or significantly delayed, your blood sugar could go low.   Here are some phone numbers to call to get a new meter and strips.   Please come back for a follow-up appointment in 3 months.

## 2016-10-07 NOTE — Progress Notes (Signed)
Vascular and Vein Specialist of [facility]  Patient name: [name] MRN: [id number] DOB: 12/08/1955 Sex: male  REASON FOR VISIT: follow-up  HPI: [name] is a 61 y.o. male who presents for continued follow-up of his right great toe amputation site and heel wound. He previously underwent angiography and angioplasty of the right anterior tibial, right superficial femoral and popliteal arteries as well as stenting of his right popliteal artery on 07/26/2016. He also underwent amputation of his right great toe with wound VAC placement at that time. He was last seen in the office on 09/02/2016. At that time, his amputation site was concerning for infection. He was placed on Bactrim for 10 days. He continued going to the wound center.  Today, his right great toe amputation site is getting better. He has been getting weekly debridement at the wound center. He is also been approved for a skin graft to his amputation site. He is starting to walk on his right foot with a postop shoe. This is causing him pain.  He has poorly controlled diabetes type 1. He has a history of hep C now on Harvoni. He continues to smoke.  Past Medical History:  Diagnosis Date  . DEPRESSION   . DIABETES MELLITUS, TYPE I   . DRUG ABUSE    pt should have NO controlled substances rx'ed  . Glaucoma   . HEPATITIS C    chronic  . Hypertension 02/18/2011  . 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 Vitiligo     Family History  Problem Relation Age of Onset  . Diabetes Father   . Kidney disease Father   . Arthritis Mother   . Heart disease Mother        CAD    SOCIAL HISTORY: Social History  Substance Use Topics  . Smoking status: Current Every Day Smoker    Packs/day: 0.25    Types: Cigarettes  . Smokeless tobacco: Never Used     Comment: cutting back  . Alcohol use No     Comment: 08/14/14 none    Allergies  Allergen Reactions  . Codeine Itching    Current Outpatient Prescriptions  Medication  Sig Dispense Refill  . aspirin EC 81 MG EC tablet Take 1 tablet (81 mg total) by mouth daily. 180 tablet 0  . insulin NPH Human (NOVOLIN N) 100 UNIT/ML injection Inject 0.45 mLs (45 Units total) into the skin daily before breakfast. 20 mL 11  . Ledipasvir-Sofosbuvir (HARVONI PO) Take by mouth.    . Sofosbuvir-Velpatasvir (EPCLUSA) 400-100 MG TABS Take 1 tablet by mouth daily. (Patient taking differently: Take 1 tablet by mouth daily. ) 28 tablet 2  . clopidogrel (PLAVIX) 75 MG tablet Take 1 tablet (75 mg total) by mouth daily. (Patient not taking: Reported on 10/07/2016) 90 tablet 0  . folic acid (FOLVITE) 1 MG tablet Take 1 tablet (1 mg total) by mouth daily. (Patient not taking: Reported on 10/07/2016) 30 tablet 0   No current facility-administered medications for this visit.     REVIEW OF SYSTEMS:  [X]  denotes positive finding, [ ]  denotes negative finding Cardiac  Comments:  Chest pain or chest pressure:    Shortness of breath upon exertion:    Short of breath when lying flat:    Irregular heart rhythm:        Vascular    Pain in calf, thigh, or hip brought on by ambulation:    Pain in feet at night that wakes you up from  your sleep:     Blood clot in your veins:    Leg swelling:         Pulmonary    Oxygen at home:    Productive cough:     Wheezing:         Neurologic    Sudden weakness in arms or legs:     Sudden numbness in arms or legs:     Sudden onset of difficulty speaking or slurred speech:    Temporary loss of vision in one eye:     Problems with dizziness:         Gastrointestinal    Blood in stool:     Vomited blood:         Genitourinary    Burning when urinating:     Blood in urine:        Psychiatric    Major depression:         Hematologic    Bleeding problems:    Problems with blood clotting too easily:        Skin    Rashes or ulcers:        Constitutional    Fever or chills:      PHYSICAL EXAM: Vitals:   10/07/16 1415  BP: 134/69    Pulse: 75  Resp: 16  Temp: 98.2 F (36.8 C)  TempSrc: Oral  SpO2: 99%  Weight: 163 lb (73.9 kg)  Height: 6' (1.829 m)    GENERAL: The patient is a well-nourished male, in no acute distress. The vital signs are documented above. HEENT: normocephalic, atraumatic. No abnormalities noted.  VASCULAR: Right great toe amputation site without erythema or drainage. Wound bed appears clean with yellow and white exudative material. Right heel ulcer clean.  PULMONARY: Non labored respiratory effort.  ABDOMEN: Soft and non-tender with normal pitched bowel sounds.  MUSCULOSKELETAL: There are no major deformities or cyanosis. NEUROLOGIC: No focal weakness or paresthesias are detected. PSYCHIATRIC: The patient has a normal affect.   MEDICAL ISSUES: Diabetic foot wound  The patient's right great toe amputation site has significantly improved since his last office visit 6 weeks ago. It does not currently look infected. Advised him to continue going to the wound care center. He will be giving a graft to this area soon. Advised him to avoid placing weight on his toe amputation site. Also stressed the importance of maximal medical management, especially diabetes and healthy diet. Plan for follow-up in 2 months to evaluate wounds. We will obtain a right arterial duplex at that time to evaluate his right popliteal stent.   Virgina Jock, PA-C [facility] of [facility] MD: Trula Slade

## 2016-10-07 NOTE — Telephone Encounter (Signed)
[name] with [facility] advised the plan of care would need to be completed by the patient's PCP.

## 2016-10-08 NOTE — Addendum Note (Signed)
Addended by: Lianne Cure A on: 10/08/2016 01:43 PM   Modules accepted: Orders

## 2016-10-14 ENCOUNTER — Encounter (HOSPITAL_BASED_OUTPATIENT_CLINIC_OR_DEPARTMENT_OTHER): Payer: Medicare HMO | Attending: Internal Medicine

## 2016-10-14 ENCOUNTER — Other Ambulatory Visit: Payer: Self-pay | Admitting: Pharmacist

## 2016-10-14 DIAGNOSIS — J449 Chronic obstructive pulmonary disease, unspecified: Secondary | ICD-10-CM | POA: Insufficient documentation

## 2016-10-14 DIAGNOSIS — E1042 Type 1 diabetes mellitus with diabetic polyneuropathy: Secondary | ICD-10-CM | POA: Diagnosis not present

## 2016-10-14 DIAGNOSIS — E10621 Type 1 diabetes mellitus with foot ulcer: Secondary | ICD-10-CM | POA: Insufficient documentation

## 2016-10-14 DIAGNOSIS — I10 Essential (primary) hypertension: Secondary | ICD-10-CM | POA: Diagnosis not present

## 2016-10-14 DIAGNOSIS — Y838 Other surgical procedures as the cause of abnormal reaction of the patient, or of later complication, without mention of misadventure at the time of the procedure: Secondary | ICD-10-CM | POA: Insufficient documentation

## 2016-10-14 DIAGNOSIS — E1052 Type 1 diabetes mellitus with diabetic peripheral angiopathy with gangrene: Secondary | ICD-10-CM | POA: Diagnosis not present

## 2016-10-14 DIAGNOSIS — L97512 Non-pressure chronic ulcer of other part of right foot with fat layer exposed: Secondary | ICD-10-CM | POA: Insufficient documentation

## 2016-10-14 DIAGNOSIS — Z89431 Acquired absence of right foot: Secondary | ICD-10-CM | POA: Insufficient documentation

## 2016-10-14 DIAGNOSIS — L97412 Non-pressure chronic ulcer of right heel and midfoot with fat layer exposed: Secondary | ICD-10-CM | POA: Diagnosis not present

## 2016-10-14 DIAGNOSIS — T8189XA Other complications of procedures, not elsewhere classified, initial encounter: Secondary | ICD-10-CM | POA: Insufficient documentation

## 2016-10-14 DIAGNOSIS — L97516 Non-pressure chronic ulcer of other part of right foot with bone involvement without evidence of necrosis: Secondary | ICD-10-CM | POA: Diagnosis not present

## 2016-10-14 LAB — GLUCOSE, CAPILLARY: Glucose-Capillary: 330 mg/dL — ABNORMAL HIGH (ref 65–99)

## 2016-10-15 ENCOUNTER — Telehealth: Payer: Self-pay | Admitting: General Practice

## 2016-10-15 NOTE — Telephone Encounter (Signed)
Please continue the same insulin for now. Please call us next week, to tell us how the blood sugar is doing

## 2016-10-15 NOTE — Telephone Encounter (Signed)
Patient is unable to continue the Novolin at this time because he does not have the money to purchase it. Patient stated he has 1 vial of Lantus at this time and needs to know what the dosage of the Lantus should be?

## 2016-10-15 NOTE — Telephone Encounter (Signed)
This is the most recent blood sugar reading the patient as. No other numbers at this time.

## 2016-10-15 NOTE — Telephone Encounter (Signed)
See message below and please advise on the dosage the patient could take of the Lantus.

## 2016-10-15 NOTE — Telephone Encounter (Signed)
Last BS reading 330 on Monday July 2nd. Patient has Lantus but no Novolin which is what he usually takes.  Transferred to [employer] (float)at [facility].   Thank you,  -LL

## 2016-10-15 NOTE — Telephone Encounter (Signed)
Take lantus at the same dosage for now.

## 2016-10-15 NOTE — Telephone Encounter (Signed)
please call patient: I need to know more about how the blood sugars are doing.  Is it just the one reading that was high?

## 2016-10-15 NOTE — Telephone Encounter (Signed)
Patient notified and had no further questions.

## 2016-10-21 DIAGNOSIS — E1052 Type 1 diabetes mellitus with diabetic peripheral angiopathy with gangrene: Secondary | ICD-10-CM | POA: Diagnosis not present

## 2016-10-21 DIAGNOSIS — T8189XA Other complications of procedures, not elsewhere classified, initial encounter: Secondary | ICD-10-CM | POA: Diagnosis not present

## 2016-10-21 DIAGNOSIS — I10 Essential (primary) hypertension: Secondary | ICD-10-CM | POA: Diagnosis not present

## 2016-10-21 DIAGNOSIS — Z89431 Acquired absence of right foot: Secondary | ICD-10-CM | POA: Diagnosis not present

## 2016-10-21 DIAGNOSIS — E1042 Type 1 diabetes mellitus with diabetic polyneuropathy: Secondary | ICD-10-CM | POA: Diagnosis not present

## 2016-10-21 DIAGNOSIS — L97512 Non-pressure chronic ulcer of other part of right foot with fat layer exposed: Secondary | ICD-10-CM | POA: Diagnosis not present

## 2016-10-21 DIAGNOSIS — J449 Chronic obstructive pulmonary disease, unspecified: Secondary | ICD-10-CM | POA: Diagnosis not present

## 2016-10-21 DIAGNOSIS — L97412 Non-pressure chronic ulcer of right heel and midfoot with fat layer exposed: Secondary | ICD-10-CM | POA: Diagnosis not present

## 2016-10-21 DIAGNOSIS — E10621 Type 1 diabetes mellitus with foot ulcer: Secondary | ICD-10-CM | POA: Diagnosis not present

## 2016-10-22 DIAGNOSIS — S91101A Unspecified open wound of right great toe without damage to nail, initial encounter: Secondary | ICD-10-CM | POA: Diagnosis not present

## 2016-10-23 ENCOUNTER — Ambulatory Visit (INDEPENDENT_AMBULATORY_CARE_PROVIDER_SITE_OTHER): Payer: Medicare HMO | Admitting: Pharmacist Clinician (PhC)/ Clinical Pharmacy Specialist

## 2016-10-23 DIAGNOSIS — B182 Chronic viral hepatitis C: Secondary | ICD-10-CM

## 2016-10-23 NOTE — Progress Notes (Signed)
HPI: [name] is a 61 y.o. male who presents today for hepatitis C follow up.  Lab Results  Component Value Date   HCVGENOTYPE 2 08/28/2016    Allergies: Allergies  Allergen Reactions  . Codeine Itching    Vitals:    Past Medical History: Past Medical History:  Diagnosis Date  . DEPRESSION   . DIABETES MELLITUS, TYPE I   . DRUG ABUSE    pt should have NO controlled substances rx'ed  . Glaucoma   . HEPATITIS C    chronic  . Hypertension 02/18/2011  . 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 Vitiligo     Social History: Social History   Social History  . Marital status: Single    Spouse name: N/A  . Number of children: N/A  . Years of education: N/A   Occupational History  .  Unemployed    disabled   Social History Main Topics  . Smoking status: Current Every Day Smoker    Packs/day: 0.25    Types: Cigarettes  . Smokeless tobacco: Never Used     Comment: cutting back  . Alcohol use No     Comment: 08/14/14 none  . Drug use: Yes    Types: "Crack" cocaine, Marijuana, Heroin     Comment: s/p rehab x 6, occasional drug use (per pt last  use was 06/2015)  . Sexual activity: Not Currently   Other Topics Concern  . Not on file   Social History Narrative   Lives in boarding house/homeless   Ongoing occ drug use-crack   Single, disabled, prev mental health drug counselor.     Labs: Hep B S Ab (no units)  Date Value  08/28/2016 NEG   Hepatitis B Surface Ag (no units)  Date Value  08/28/2016 NEGATIVE   HCV Ab (s/co ratio)  Date Value  07/24/2016 >11.0 (H)    Lab Results  Component Value Date   HCVGENOTYPE 2 08/28/2016    Hepatitis C RNA quantitative Latest Ref Rng & Units 07/26/2016 07/12/2015  HCV Quantitative >50 IU/mL 45,300 260,868(H)  HCV Quantitative Log >1.70 log10 IU/mL 4.656 5.42(H)    AST (U/L)  Date Value  08/28/2016 24  07/24/2016 25  07/22/2016 30   ALT (U/L)  Date Value  08/28/2016 26  08/28/2016 26  07/24/2016 23   07/22/2016 23   INR (no units)  Date Value  08/28/2016 0.9  07/25/2016 1.00    CrCl: CrCl cannot be calculated (Patient's most recent lab result is older than the maximum 21 days allowed.).  Fibrosis Score: F0/F1 as assessed by fibrosure   Child-Pugh Score: A  Previous Treatment Regimen: None  Assessment: Mr. [name] is a 61 year old male who presents today for hepatitis C follow up. He started [place] on 6/22 and has not missed any doses since starting the medication. His only other medication he is taking is NPH insulin.   Audi has not had any issues tolerating the Epclusa. He does complain of some pain at his great toe amputation site and was frustrated that he wasn't provided any pain killers for it.  I encouraged him to continue taking his medication every day and to contact the clinic if he starts any new medications. He states he sometimes has heartburn in the middle of the night. He asked if he could take Pepto Bismol with his Epclusa. I told him this should be fine.  Recommendations: - Continue Epclusa daily x 12 weeks - Hep C viral load today -  Follow up pharmacy clinic appointment scheduled for 9/27 at 1:30 pm  Dimitri Ped, PharmD, BCPS PGY-2 Infectious Diseases Pharmacy Resident Pager: [phone] 10/23/2016, 2:08 PM

## 2016-10-23 NOTE — Patient Instructions (Signed)
Continue to take your Epclusa every day! We will see you back after you finish your 12 weeks of treatment.

## 2016-10-25 LAB — HEPATITIS C RNA QUANTITATIVE
HCV Quantitative Log: <1.18 Log IU/mL
HCV Quantitative: <15 IU/mL

## 2016-10-28 DIAGNOSIS — T8189XA Other complications of procedures, not elsewhere classified, initial encounter: Secondary | ICD-10-CM | POA: Diagnosis not present

## 2016-10-28 DIAGNOSIS — E10621 Type 1 diabetes mellitus with foot ulcer: Secondary | ICD-10-CM | POA: Diagnosis not present

## 2016-10-28 DIAGNOSIS — L97412 Non-pressure chronic ulcer of right heel and midfoot with fat layer exposed: Secondary | ICD-10-CM | POA: Diagnosis not present

## 2016-10-28 DIAGNOSIS — Z89431 Acquired absence of right foot: Secondary | ICD-10-CM | POA: Diagnosis not present

## 2016-10-28 DIAGNOSIS — I10 Essential (primary) hypertension: Secondary | ICD-10-CM | POA: Diagnosis not present

## 2016-10-28 DIAGNOSIS — E1042 Type 1 diabetes mellitus with diabetic polyneuropathy: Secondary | ICD-10-CM | POA: Diagnosis not present

## 2016-10-28 DIAGNOSIS — L97512 Non-pressure chronic ulcer of other part of right foot with fat layer exposed: Secondary | ICD-10-CM | POA: Diagnosis not present

## 2016-10-28 DIAGNOSIS — E1052 Type 1 diabetes mellitus with diabetic peripheral angiopathy with gangrene: Secondary | ICD-10-CM | POA: Diagnosis not present

## 2016-10-28 DIAGNOSIS — L97516 Non-pressure chronic ulcer of other part of right foot with bone involvement without evidence of necrosis: Secondary | ICD-10-CM | POA: Diagnosis not present

## 2016-10-28 DIAGNOSIS — J449 Chronic obstructive pulmonary disease, unspecified: Secondary | ICD-10-CM | POA: Diagnosis not present

## 2016-10-28 MED FILL — EPCLUSA 400 MG-100 MG TAB: 400-100 | 28 days supply | Qty: 28 | Fill #1

## 2016-11-04 DIAGNOSIS — E1052 Type 1 diabetes mellitus with diabetic peripheral angiopathy with gangrene: Secondary | ICD-10-CM | POA: Diagnosis not present

## 2016-11-04 DIAGNOSIS — T8189XA Other complications of procedures, not elsewhere classified, initial encounter: Secondary | ICD-10-CM | POA: Diagnosis not present

## 2016-11-04 DIAGNOSIS — L97412 Non-pressure chronic ulcer of right heel and midfoot with fat layer exposed: Secondary | ICD-10-CM | POA: Diagnosis not present

## 2016-11-04 DIAGNOSIS — E1042 Type 1 diabetes mellitus with diabetic polyneuropathy: Secondary | ICD-10-CM | POA: Diagnosis not present

## 2016-11-04 DIAGNOSIS — Z89431 Acquired absence of right foot: Secondary | ICD-10-CM | POA: Diagnosis not present

## 2016-11-04 DIAGNOSIS — L97516 Non-pressure chronic ulcer of other part of right foot with bone involvement without evidence of necrosis: Secondary | ICD-10-CM | POA: Diagnosis not present

## 2016-11-04 DIAGNOSIS — L97512 Non-pressure chronic ulcer of other part of right foot with fat layer exposed: Secondary | ICD-10-CM | POA: Diagnosis not present

## 2016-11-04 DIAGNOSIS — E10621 Type 1 diabetes mellitus with foot ulcer: Secondary | ICD-10-CM | POA: Diagnosis not present

## 2016-11-04 DIAGNOSIS — I10 Essential (primary) hypertension: Secondary | ICD-10-CM | POA: Diagnosis not present

## 2016-11-04 DIAGNOSIS — J449 Chronic obstructive pulmonary disease, unspecified: Secondary | ICD-10-CM | POA: Diagnosis not present

## 2016-11-05 DIAGNOSIS — S91101A Unspecified open wound of right great toe without damage to nail, initial encounter: Secondary | ICD-10-CM | POA: Diagnosis not present

## 2016-11-18 ENCOUNTER — Encounter (HOSPITAL_BASED_OUTPATIENT_CLINIC_OR_DEPARTMENT_OTHER): Payer: Medicare HMO | Attending: Internal Medicine

## 2016-11-18 DIAGNOSIS — E1040 Type 1 diabetes mellitus with diabetic neuropathy, unspecified: Secondary | ICD-10-CM | POA: Insufficient documentation

## 2016-11-18 DIAGNOSIS — L97516 Non-pressure chronic ulcer of other part of right foot with bone involvement without evidence of necrosis: Secondary | ICD-10-CM | POA: Diagnosis not present

## 2016-11-18 DIAGNOSIS — E10621 Type 1 diabetes mellitus with foot ulcer: Secondary | ICD-10-CM | POA: Diagnosis not present

## 2016-11-18 DIAGNOSIS — Y835 Amputation of limb(s) as the cause of abnormal reaction of the patient, or of later complication, without mention of misadventure at the time of the procedure: Secondary | ICD-10-CM | POA: Diagnosis not present

## 2016-11-18 DIAGNOSIS — S91101A Unspecified open wound of right great toe without damage to nail, initial encounter: Secondary | ICD-10-CM | POA: Diagnosis not present

## 2016-11-18 DIAGNOSIS — J449 Chronic obstructive pulmonary disease, unspecified: Secondary | ICD-10-CM | POA: Diagnosis not present

## 2016-11-18 DIAGNOSIS — Z89431 Acquired absence of right foot: Secondary | ICD-10-CM | POA: Diagnosis not present

## 2016-11-18 DIAGNOSIS — L97512 Non-pressure chronic ulcer of other part of right foot with fat layer exposed: Secondary | ICD-10-CM | POA: Diagnosis not present

## 2016-11-18 DIAGNOSIS — T8189XA Other complications of procedures, not elsewhere classified, initial encounter: Secondary | ICD-10-CM | POA: Insufficient documentation

## 2016-11-18 DIAGNOSIS — I10 Essential (primary) hypertension: Secondary | ICD-10-CM | POA: Diagnosis not present

## 2016-11-22 MED FILL — EPCLUSA 400 MG-100 MG TAB: 400-100 | 28 days supply | Qty: 28 | Fill #2

## 2016-11-25 DIAGNOSIS — Z89431 Acquired absence of right foot: Secondary | ICD-10-CM | POA: Diagnosis not present

## 2016-11-25 DIAGNOSIS — E10621 Type 1 diabetes mellitus with foot ulcer: Secondary | ICD-10-CM | POA: Diagnosis not present

## 2016-11-25 DIAGNOSIS — T8189XA Other complications of procedures, not elsewhere classified, initial encounter: Secondary | ICD-10-CM | POA: Diagnosis not present

## 2016-11-25 DIAGNOSIS — E1040 Type 1 diabetes mellitus with diabetic neuropathy, unspecified: Secondary | ICD-10-CM | POA: Diagnosis not present

## 2016-11-25 DIAGNOSIS — L97516 Non-pressure chronic ulcer of other part of right foot with bone involvement without evidence of necrosis: Secondary | ICD-10-CM | POA: Diagnosis not present

## 2016-11-25 DIAGNOSIS — J449 Chronic obstructive pulmonary disease, unspecified: Secondary | ICD-10-CM | POA: Diagnosis not present

## 2016-11-25 DIAGNOSIS — I10 Essential (primary) hypertension: Secondary | ICD-10-CM | POA: Diagnosis not present

## 2016-11-27 ENCOUNTER — Encounter: Payer: Self-pay | Admitting: Surgery

## 2016-12-02 DIAGNOSIS — J449 Chronic obstructive pulmonary disease, unspecified: Secondary | ICD-10-CM | POA: Diagnosis not present

## 2016-12-02 DIAGNOSIS — E1040 Type 1 diabetes mellitus with diabetic neuropathy, unspecified: Secondary | ICD-10-CM | POA: Diagnosis not present

## 2016-12-02 DIAGNOSIS — Z89431 Acquired absence of right foot: Secondary | ICD-10-CM | POA: Diagnosis not present

## 2016-12-02 DIAGNOSIS — T8189XA Other complications of procedures, not elsewhere classified, initial encounter: Secondary | ICD-10-CM | POA: Diagnosis not present

## 2016-12-02 DIAGNOSIS — L97516 Non-pressure chronic ulcer of other part of right foot with bone involvement without evidence of necrosis: Secondary | ICD-10-CM | POA: Diagnosis not present

## 2016-12-02 DIAGNOSIS — I10 Essential (primary) hypertension: Secondary | ICD-10-CM | POA: Diagnosis not present

## 2016-12-02 DIAGNOSIS — E10621 Type 1 diabetes mellitus with foot ulcer: Secondary | ICD-10-CM | POA: Diagnosis not present

## 2016-12-09 ENCOUNTER — Ambulatory Visit (HOSPITAL_COMMUNITY)
Admission: RE | Admit: 2016-12-09 | Discharge: 2016-12-09 | Disposition: A | Discharge status: Home / Self Care | Payer: Medicare HMO | Source: Ambulatory Visit | Attending: Surgery | Admitting: Surgery

## 2016-12-09 ENCOUNTER — Ambulatory Visit (INDEPENDENT_AMBULATORY_CARE_PROVIDER_SITE_OTHER): Payer: Medicare HMO | Admitting: Surgery

## 2016-12-09 ENCOUNTER — Encounter: Payer: Self-pay | Admitting: Surgery

## 2016-12-09 VITALS — BP 156/76 | HR 73 | Ht 72.0 in | Wt 169.4 lb

## 2016-12-09 DIAGNOSIS — E10621 Type 1 diabetes mellitus with foot ulcer: Secondary | ICD-10-CM

## 2016-12-09 DIAGNOSIS — L97515 Non-pressure chronic ulcer of other part of right foot with muscle involvement without evidence of necrosis: Secondary | ICD-10-CM

## 2016-12-09 DIAGNOSIS — I70291 Other atherosclerosis of native arteries of extremities, right leg: Secondary | ICD-10-CM | POA: Diagnosis not present

## 2016-12-09 DIAGNOSIS — Z95828 Presence of other vascular implants and grafts: Secondary | ICD-10-CM | POA: Insufficient documentation

## 2016-12-09 LAB — VAS US LOWER EXTREMITY ARTERIAL DUPLEX
RIGHT ANT DIST TIBAL SYS PSV: 61 cm/s
RIGHT POST TIB DIST SYS: 36 cm/s
RPERPSV: 32 cm/s
RSFPPSV: -91 cm/s
Right super femoral dist sys PSV: -405 cm/s
Right super femoral mid sys PSV: -80 cm/s

## 2016-12-09 NOTE — Progress Notes (Signed)
Vascular and Vein Specialist of [facility]  Patient name: [name] MRN: [id number] DOB: [birth year] Sex: male   REASON FOR VISIT:    Follow up  HISOTRY OF PRESENT ILLNESS:    [name] is a 61 y.o. male who presents for continued follow-up of his right great toe amputation site and heel wound. He previously underwent angiography and angioplasty of the right anterior tibial, right superficial femoral and popliteal arteries as well as stenting of his right popliteal artery on 07/26/2016. He also underwent amputation of his right great toe with wound VAC placement at that time. He was last seen in the office on 09/02/2016. At that time, his amputation site was concerning for infection. He was placed on Bactrim for 10 days. He continued going to the wound center.  He states that his diabetes is now under better control with an A1c around 7.  He continues to take Harvoni for his hepatitis C.  He is continuing to smoke.  He gets follow-up at the wound center for his amputation site which is healing.  He is not having fevers or chills   PAST MEDICAL HISTORY:   Past Medical History:  Diagnosis Date  . DEPRESSION   . DIABETES MELLITUS, TYPE I   . DRUG ABUSE    pt should have NO controlled substances rx'ed  . Glaucoma   . HEPATITIS C    chronic  . Hypertension 02/18/2011  . 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 Vitiligo      FAMILY HISTORY:   Family History  Problem Relation Age of Onset  . Diabetes Father   . Kidney disease Father   . Arthritis Mother   . Heart disease Mother        CAD    SOCIAL HISTORY:   Social History  Substance Use Topics  . Smoking status: Current Every Day Smoker    Packs/day: 0.25    Types: Cigarettes  . Smokeless tobacco: Never Used     Comment: cutting back  . Alcohol use No     Comment: 08/14/14 none     ALLERGIES:   Allergies  Allergen Reactions  . Codeine Itching     CURRENT  MEDICATIONS:   Current Outpatient Prescriptions  Medication Sig Dispense Refill  . aspirin EC 81 MG EC tablet Take 1 tablet (81 mg total) by mouth daily. 180 tablet 0  . insulin NPH Human (NOVOLIN N) 100 UNIT/ML injection Inject 0.45 mLs (45 Units total) into the skin daily before breakfast. 20 mL 11  . Sofosbuvir-Velpatasvir (EPCLUSA) 400-100 MG TABS Take 1 tablet by mouth daily. 28 tablet 2   No current facility-administered medications for this visit.     REVIEW OF SYSTEMS:   [X]  denotes positive finding, [ ]  denotes negative finding Cardiac  Comments:  Chest pain or chest pressure:    Shortness of breath upon exertion:    Short of breath when lying flat:    Irregular heart rhythm:        Vascular    Pain in calf, thigh, or hip brought on by ambulation:    Pain in feet at night that wakes you up from your sleep:     Blood clot in your veins:    Leg swelling:         Pulmonary    Oxygen at home:    Productive cough:     Wheezing:         Neurologic    Sudden weakness in  arms or legs:     Sudden numbness in arms or legs:     Sudden onset of difficulty speaking or slurred speech:    Temporary loss of vision in one eye:     Problems with dizziness:         Gastrointestinal    Blood in stool:     Vomited blood:         Genitourinary    Burning when urinating:     Blood in urine:        Psychiatric    Major depression:         Hematologic    Bleeding problems:    Problems with blood clotting too easily:        Skin    Rashes or ulcers:        Constitutional    Fever or chills:      PHYSICAL EXAM:   Vitals:   12/09/16 1527  BP: (!) 156/76  Pulse: 73  SpO2: 98%  Weight: 169 lb 6.4 oz (76.8 kg)  Height: 6' (1.829 m)    GENERAL: The patient is a well-nourished male, in no acute distress. The vital signs are documented above. CARDIAC: There is a regular rate and rhythm.  VASCULAR: Pedal pulses are nonpalpable PULMONARY: Non-labored  respirations ABDOMEN: Soft and non-tender with normal pitched bowel sounds.  MUSCULOSKELETAL: There are no major deformities or cyanosis. NEUROLOGIC: No focal weakness or paresthesias are detected. SKIN: The amputation site continues to heal there is just a dry eschar there are now without erythema. PSYCHIATRIC: The patient has a normal affect.  STUDIES:   I have ordered and reviewed his vascular lab studies.  He has developed elevated velocities within the distal stent.  He has monophasic waveforms throughout the right leg.  There is a new stenosis in the distal popliteal artery and distal right femoral artery.  MEDICAL ISSUES:   I discussed the importance significance of the ultrasound findings above.  I think in order to preserve the recent interventions patency as well as to achieve wound healing of his great toe wound, we need to address the stenosis in his right femoral artery.  He will need to undergo angiography with intervention on the right leg if possible.  This can be done through left femoral access.  This is been scheduled for September 11.  I again stressed the importance of smoking cessation.    Annamarie Major, MD [facility] Vein Specialists of [facility] [phone] Pager [phone]

## 2016-12-12 ENCOUNTER — Other Ambulatory Visit: Payer: Self-pay

## 2016-12-19 ENCOUNTER — Encounter (HOSPITAL_BASED_OUTPATIENT_CLINIC_OR_DEPARTMENT_OTHER): Payer: Medicare HMO | Attending: Internal Medicine

## 2016-12-19 DIAGNOSIS — I10 Essential (primary) hypertension: Secondary | ICD-10-CM | POA: Insufficient documentation

## 2016-12-19 DIAGNOSIS — Z89411 Acquired absence of right great toe: Secondary | ICD-10-CM | POA: Insufficient documentation

## 2016-12-19 DIAGNOSIS — T8189XA Other complications of procedures, not elsewhere classified, initial encounter: Secondary | ICD-10-CM | POA: Insufficient documentation

## 2016-12-19 DIAGNOSIS — E1052 Type 1 diabetes mellitus with diabetic peripheral angiopathy with gangrene: Secondary | ICD-10-CM | POA: Insufficient documentation

## 2016-12-19 DIAGNOSIS — Y835 Amputation of limb(s) as the cause of abnormal reaction of the patient, or of later complication, without mention of misadventure at the time of the procedure: Secondary | ICD-10-CM | POA: Insufficient documentation

## 2016-12-19 DIAGNOSIS — J449 Chronic obstructive pulmonary disease, unspecified: Secondary | ICD-10-CM | POA: Insufficient documentation

## 2016-12-19 DIAGNOSIS — L97512 Non-pressure chronic ulcer of other part of right foot with fat layer exposed: Secondary | ICD-10-CM | POA: Insufficient documentation

## 2016-12-19 DIAGNOSIS — E10621 Type 1 diabetes mellitus with foot ulcer: Secondary | ICD-10-CM | POA: Insufficient documentation

## 2016-12-19 DIAGNOSIS — E1042 Type 1 diabetes mellitus with diabetic polyneuropathy: Secondary | ICD-10-CM | POA: Insufficient documentation

## 2016-12-19 DIAGNOSIS — E1051 Type 1 diabetes mellitus with diabetic peripheral angiopathy without gangrene: Secondary | ICD-10-CM | POA: Insufficient documentation

## 2016-12-24 ENCOUNTER — Encounter (HOSPITAL_COMMUNITY)
Admission: RE | Disposition: A | Discharge status: Home / Self Care | Payer: Self-pay | Source: Ambulatory Visit | Attending: Surgery

## 2016-12-24 ENCOUNTER — Encounter (HOSPITAL_COMMUNITY): Payer: Self-pay | Admitting: *Deleted

## 2016-12-24 ENCOUNTER — Observation Stay (HOSPITAL_COMMUNITY)
Admission: RE | Admit: 2016-12-24 | Discharge: 2016-12-25 | Disposition: A | Discharge status: Home / Self Care | Payer: Medicare HMO | Source: Ambulatory Visit | Attending: Surgery | Admitting: Surgery

## 2016-12-24 DIAGNOSIS — L97919 Non-pressure chronic ulcer of unspecified part of right lower leg with unspecified severity: Secondary | ICD-10-CM | POA: Diagnosis not present

## 2016-12-24 DIAGNOSIS — Z7982 Long term (current) use of aspirin: Secondary | ICD-10-CM | POA: Diagnosis not present

## 2016-12-24 DIAGNOSIS — I739 Peripheral vascular disease, unspecified: Secondary | ICD-10-CM | POA: Diagnosis present

## 2016-12-24 DIAGNOSIS — E11622 Type 2 diabetes mellitus with other skin ulcer: Secondary | ICD-10-CM | POA: Diagnosis not present

## 2016-12-24 DIAGNOSIS — B1920 Unspecified viral hepatitis C without hepatic coma: Secondary | ICD-10-CM | POA: Insufficient documentation

## 2016-12-24 DIAGNOSIS — Z89411 Acquired absence of right great toe: Secondary | ICD-10-CM | POA: Insufficient documentation

## 2016-12-24 DIAGNOSIS — Z8249 Family history of ischemic heart disease and other diseases of the circulatory system: Secondary | ICD-10-CM | POA: Diagnosis not present

## 2016-12-24 DIAGNOSIS — E11621 Type 2 diabetes mellitus with foot ulcer: Secondary | ICD-10-CM | POA: Insufficient documentation

## 2016-12-24 DIAGNOSIS — Z79899 Other long term (current) drug therapy: Secondary | ICD-10-CM | POA: Diagnosis not present

## 2016-12-24 DIAGNOSIS — Z794 Long term (current) use of insulin: Secondary | ICD-10-CM | POA: Insufficient documentation

## 2016-12-24 DIAGNOSIS — H409 Unspecified glaucoma: Secondary | ICD-10-CM | POA: Insufficient documentation

## 2016-12-24 DIAGNOSIS — F17210 Nicotine dependence, cigarettes, uncomplicated: Secondary | ICD-10-CM | POA: Diagnosis not present

## 2016-12-24 DIAGNOSIS — I10 Essential (primary) hypertension: Secondary | ICD-10-CM | POA: Diagnosis not present

## 2016-12-24 DIAGNOSIS — I70238 Atherosclerosis of native arteries of right leg with ulceration of other part of lower right leg: Secondary | ICD-10-CM | POA: Diagnosis not present

## 2016-12-24 HISTORY — PX: PERIPHERAL VASCULAR INTERVENTION: CATH118257

## 2016-12-24 HISTORY — PX: ABDOMINAL AORTOGRAM W/LOWER EXTREMITY: CATH118223

## 2016-12-24 LAB — POCT I-STAT, CHEM 8
BUN: 16 mg/dL (ref 6–20)
Calcium, Ion: 1.17 mmol/L (ref 1.15–1.40)
Chloride: 100 mmol/L — ABNORMAL LOW (ref 101–111)
Creatinine, Ser: 1.1 mg/dL (ref 0.61–1.24)
GLUCOSE: 43 mg/dL — AB (ref 65–99)
HCT: 41 % (ref 39.0–52.0)
Hemoglobin: 13.9 g/dL (ref 13.0–17.0)
Potassium: 4 mmol/L (ref 3.5–5.1)
SODIUM: 139 mmol/L (ref 135–145)
TCO2: 30 mmol/L (ref 22–32)

## 2016-12-24 LAB — GLUCOSE, CAPILLARY
GLUCOSE-CAPILLARY: 102 mg/dL — AB (ref 65–99)
GLUCOSE-CAPILLARY: 33 mg/dL — AB (ref 65–99)
GLUCOSE-CAPILLARY: 47 mg/dL — AB (ref 65–99)
GLUCOSE-CAPILLARY: 30 mg/dL — AB (ref 65–99)
GLUCOSE-CAPILLARY: 164 mg/dL — AB (ref 65–99)
GLUCOSE-CAPILLARY: 303 mg/dL — AB (ref 65–99)
Glucose-Capillary: 47 mg/dL — ABNORMAL LOW (ref 65–99)
Glucose-Capillary: 90 mg/dL (ref 65–99)
Glucose-Capillary: 107 mg/dL — ABNORMAL HIGH (ref 65–99)

## 2016-12-24 LAB — POCT ACTIVATED CLOTTING TIME
ACTIVATED CLOTTING TIME: 224 s
Activated Clotting Time: 169 seconds

## 2016-12-24 SURGERY — ABDOMINAL AORTOGRAM W/LOWER EXTREMITY
Anesthesia: LOCAL | Laterality: Right

## 2016-12-24 MED ORDER — SODIUM CHLORIDE 0.9% FLUSH: 3.0000 mL | INTRAVENOUS | Status: DC | PRN

## 2016-12-24 MED ORDER — HYDRALAZINE HCL 20 MG/ML IJ SOLN
5.0000 mg | INTRAMUSCULAR | Status: AC | PRN
  Administered 2016-12-24 (×2): 5 mg via INTRAVENOUS

## 2016-12-24 MED ORDER — LABETALOL HCL 5 MG/ML IV SOLN
10.0000 mg | INTRAVENOUS | Status: DC | PRN
  Administered 2016-12-24 (×2): 10 mg via INTRAVENOUS

## 2016-12-24 MED ORDER — DEXTROSE 50 % IV SOLN
25.0000 mL | Freq: Once | INTRAVENOUS | Status: AC
  Administered 2016-12-24 (×2): 25 mL via INTRAVENOUS

## 2016-12-24 MED ORDER — DEXTROSE 5 % IV SOLN
INTRAVENOUS | Status: DC
  Administered 2016-12-24: 15:00:00 via INTRAVENOUS

## 2016-12-24 MED ORDER — CLOPIDOGREL BISULFATE 75 MG PO TABS: 75.0000 mg | ORAL_TABLET | Freq: Every day | ORAL | Status: DC

## 2016-12-24 MED ORDER — SODIUM CHLORIDE 0.9 % IV SOLN
INTRAVENOUS | Status: DC
  Administered 2016-12-24: 10:00:00 via INTRAVENOUS

## 2016-12-24 MED ORDER — MIDAZOLAM HCL 2 MG/2ML IJ SOLN
INTRAMUSCULAR | Status: AC
  Filled 2016-12-24: qty 2

## 2016-12-24 MED ORDER — LABETALOL HCL 5 MG/ML IV SOLN
INTRAVENOUS | Status: AC
  Filled 2016-12-24: qty 4

## 2016-12-24 MED ORDER — DEXTROSE 50 % IV SOLN
INTRAVENOUS | Status: AC
  Administered 2016-12-24: 25 mL via INTRAVENOUS
  Filled 2016-12-24: qty 50

## 2016-12-24 MED ORDER — IODIXANOL 320 MG/ML IV SOLN
INTRAVENOUS | Status: DC | PRN
  Administered 2016-12-24: 150 mL via INTRA_ARTERIAL

## 2016-12-24 MED ORDER — SODIUM CHLORIDE 0.9% FLUSH: 3.0000 mL | Freq: Two times a day (BID) | INTRAVENOUS | Status: DC

## 2016-12-24 MED ORDER — FAMOTIDINE 20 MG PO TABS
20.0000 mg | ORAL_TABLET | Freq: Two times a day (BID) | ORAL | Status: DC
  Administered 2016-12-24: 20 mg via ORAL
  Filled 2016-12-24 (×2): qty 1

## 2016-12-24 MED ORDER — HEPARIN (PORCINE) IN NACL 2-0.9 UNIT/ML-% IJ SOLN
INTRAMUSCULAR | Status: AC | PRN
  Administered 2016-12-24: 1000 mL via INTRA_ARTERIAL

## 2016-12-24 MED ORDER — DEXTROSE 50 % IV SOLN
INTRAVENOUS | Status: DC | PRN
  Administered 2016-12-24: 1 via INTRAVENOUS
  Administered 2016-12-24: 50
  Administered 2016-12-24: 1 via INTRAVENOUS

## 2016-12-24 MED ORDER — FENTANYL CITRATE (PF) 100 MCG/2ML IJ SOLN
INTRAMUSCULAR | Status: AC
  Filled 2016-12-24: qty 2

## 2016-12-24 MED ORDER — DEXTROSE 50 % IV SOLN
INTRAVENOUS | Status: AC
  Filled 2016-12-24: qty 50

## 2016-12-24 MED ORDER — ACETAMINOPHEN 325 MG PO TABS: 650.0000 mg | ORAL_TABLET | ORAL | Status: DC | PRN

## 2016-12-24 MED ORDER — DEXTROSE 50 % IV SOLN
INTRAVENOUS | Status: AC
Start: 2016-12-24 — End: 2016-12-24
  Filled 2016-12-24: qty 50

## 2016-12-24 MED ORDER — INSULIN ASPART 100 UNIT/ML ~~LOC~~ SOLN: 0.0000 [IU] | Freq: Three times a day (TID) | SUBCUTANEOUS | Status: DC

## 2016-12-24 MED ORDER — FENTANYL CITRATE (PF) 100 MCG/2ML IJ SOLN
INTRAMUSCULAR | Status: AC
Start: 2016-12-24 — End: 2016-12-24
  Filled 2016-12-24: qty 2

## 2016-12-24 MED ORDER — HYDRALAZINE HCL 20 MG/ML IJ SOLN
INTRAMUSCULAR | Status: AC
  Filled 2016-12-24: qty 1

## 2016-12-24 MED ORDER — ASPIRIN EC 81 MG PO TBEC
81.0000 mg | DELAYED_RELEASE_TABLET | Freq: Every day | ORAL | Status: DC
  Administered 2016-12-24: 81 mg via ORAL
  Filled 2016-12-24 (×2): qty 1

## 2016-12-24 MED ORDER — SOFOSBUVIR-VELPATASVIR 400-100 MG PO TABS: 1.0000 | ORAL_TABLET | Freq: Every day | ORAL | Status: DC

## 2016-12-24 MED ORDER — HEPARIN SODIUM (PORCINE) 1000 UNIT/ML IJ SOLN
INTRAMUSCULAR | Status: DC | PRN
  Administered 2016-12-24: 8000 [IU] via INTRAVENOUS

## 2016-12-24 MED ORDER — HEPARIN SODIUM (PORCINE) 1000 UNIT/ML IJ SOLN
INTRAMUSCULAR | Status: AC
  Filled 2016-12-24: qty 1

## 2016-12-24 MED ORDER — SODIUM CHLORIDE 0.9 % WEIGHT BASED INFUSION
1.0000 mL/kg/h | INTRAVENOUS | Status: AC
  Administered 2016-12-24: 1 mL/kg/h via INTRAVENOUS

## 2016-12-24 MED ORDER — ONDANSETRON HCL 4 MG/2ML IJ SOLN: 4.0000 mg | Freq: Four times a day (QID) | INTRAMUSCULAR | Status: DC | PRN

## 2016-12-24 MED ORDER — FENTANYL CITRATE (PF) 100 MCG/2ML IJ SOLN
INTRAMUSCULAR | Status: DC | PRN
  Administered 2016-12-24 (×3): 50 ug via INTRAVENOUS

## 2016-12-24 MED ORDER — LIDOCAINE HCL (PF) 1 % IJ SOLN
INTRAMUSCULAR | Status: DC | PRN
  Administered 2016-12-24: 20 mL via INTRADERMAL

## 2016-12-24 MED ORDER — SODIUM CHLORIDE 0.9 % IV SOLN: 250.0000 mL | INTRAVENOUS | Status: DC | PRN

## 2016-12-24 MED ORDER — MIDAZOLAM HCL 2 MG/2ML IJ SOLN
INTRAMUSCULAR | Status: DC | PRN
  Administered 2016-12-24 (×3): 2 mg via INTRAVENOUS

## 2016-12-24 SURGICAL SUPPLY — 24 items
BAG SNAP BAND KOVER 36X36 (MISCELLANEOUS) ×1 IMPLANT
BALLN COYOTE OTW 3X100X150 (BALLOONS) ×3
BALLN MUSTANG 6X60X135 (BALLOONS) ×3
BALLN STERLING OTW 3X100X150 (BALLOONS) ×3
BALLOON COYOTE OTW 3X100X150 (BALLOONS) IMPLANT
BALLOON MUSTANG 6X60X135 (BALLOONS) IMPLANT
BALLOON STERLING OTW 3X100X150 (BALLOONS) IMPLANT
CATH OMNI FLUSH 5F 65CM (CATHETERS) ×1 IMPLANT
CATH QUICKCROSS .035X135CM (MICROCATHETER) ×1 IMPLANT
COVER DOME SNAP 22 D (MISCELLANEOUS) ×1 IMPLANT
DEVICE CONTINUOUS FLUSH (MISCELLANEOUS) ×1 IMPLANT
GUIDEWIRE STR TIP .014X300X8 (WIRE) ×1 IMPLANT
KIT ENCORE 26 ADVANTAGE (KITS) ×1 IMPLANT
KIT MICROINTRODUCER STIFF 5F (SHEATH) ×1 IMPLANT
KIT PV (KITS) ×3 IMPLANT
SHEATH PINNACLE 5F 10CM (SHEATH) ×1 IMPLANT
SHEATH PINNACLE MP 6F 45CM (SHEATH) ×1 IMPLANT
STENT INNOVA 7X60X130 (Permanent Stent) ×1 IMPLANT
SYR MEDRAD MARK V 150ML (SYRINGE) ×3 IMPLANT
TAPE RADIOPAQUE TURBO (MISCELLANEOUS) ×1 IMPLANT
TRANSDUCER W/STOPCOCK (MISCELLANEOUS) ×3 IMPLANT
TRAY PV CATH (CUSTOM PROCEDURE TRAY) ×3 IMPLANT
WIRE BENTSON .035X145CM (WIRE) ×1 IMPLANT
WIRE G V18X300CM (WIRE) ×1 IMPLANT

## 2016-12-24 NOTE — Op Note (Signed)
Patient name: [name] MRN: [id number] DOB: 1955-09-20 Sex: male  12/24/2016 Pre-operative Diagnosis: Right leg ulcer Post-operative diagnosis:  Same Surgeon:  Annamarie Major Procedure Performed:  1.  Ultrasound-guided access, left femoral artery  2.  Abdominal aortogram  3.  Right lower extremity runoff  4.  Angioplasty, right anterior tibial artery  5.  Angioplasty, right popliteal artery  6.  Stent, right superficial femoral artery  7.  Conscious sedation (95 minutes)   Indications:  The patient presented earlier with a diabetic foot ulcer.  He underwent toe amputation and percutaneous revascularization.  His most recent ultrasound suggested a high-grade lesion.  He is back today for further evaluation.  Procedure:  The patient was identified in the holding area and taken to room 8.  The patient was then placed supine on the table and prepped and draped in the usual sterile fashion.  A time out was called.  Conscious sedation was administered with the use of IV fentanyl and Versed in a continuous physician and nurse monitoring.  Heart rate, blood pressure, and oxygen saturation continuously monitored.  Ultrasound was used to evaluate the left common femoral artery.  It was patent .  A digital ultrasound image was acquired.  A micropuncture needle was used to access the left common femoral artery under ultrasound guidance.  An 018 wire was advanced without resistance and a micropuncture sheath was placed.  The 018 wire was removed and a benson wire was placed.  The micropuncture sheath was exchanged for a 5 french sheath.  An omniflush catheter was advanced over the wire to the level of L-1.  An abdominal angiogram was obtained.  Next, using the omniflush catheter and a benson wire, the aortic bifurcation was crossed and the catheter was placed into theright external iliac artery and right runoff was obtained.   Findings:   Aortogram:  No significant renal artery stenosis.  The  infrarenal abdominal aorta is patent throughout its course as are bilateral common and external iliac arteries.  Right Lower Extremity:  The right common femoral artery is patent, however at the adductor canal there is a 75% lesion.  The previous stent within the right popliteal artery has approximately 80% stenosis.  There is also diffuse stenosis throughout the native popliteal artery below the knee approximately 50-60%.  The anterior tibial artery has a 50% stenosis as a ghost of the interosseous membrane.  It is nearly occluded and the lower leg.  Left Lower Extremity:  Not evaluated  Intervention:  After the above images were acquired the decision was made to proceed with intervention.  A 6 French sheath was advanced into the right external iliac artery.  The patient was fully heparinized.  Using   AP 14 and AV 18 wire with the support of a 3 mm balloon, the lesions were all successfully crossed.  A Sterling 3 x 100 balloon was used to perform balloon angioplasty of the anterior tibial artery.  2 separate inflations were required.  I then used this balloon to perform balloon angioplasty of the popliteal artery.  After this was completed, arteriography was performed which showed complete resolution of the stenosis within the anterior tibial artery.  There was residual luminal narrowing of approximately 40% within the popliteal artery.  There is also a area in the native superficial femoral artery had approximately a 60-70% stenosis.  I elected to treat the superficial femoral artery native lesion using a 7 x 60 INNOVA stent.  It was molded with  a 6 mm balloon.  I then advanced the same 6 mm balloon into the native below-knee popliteal artery and perform balloon angioplasty of the native popliteal artery at nominal pressure for 2 minutes.  Follow-up imaging revealed resolution of all the stenosis.  The decision was made to terminate the procedure at this time.  Catheters and wires were removed.  The patient  be taken the holding area for sheath pull once his platelets are profile corrects.  Impression:  #1  near occlusion of the mid right anterior tibial artery and 50-60 percent stenosis within the anterior tibial artery at the interosseous membrane.  The lesions were successfully dilated using a 3 mm balloon with no residual stenosis.  #2  60-70% native popliteal artery stenosis successfully treated with a 6 mm balloon.  There is also in-stent stenosis within the right popliteal artery stent successfully treated with a 6 mm balloon.  #3  native stenosis of the right superficial femoral artery successfully stented using a 7 x 60 self-expanding stent with no residual stenosis.    Theotis Burrow, M.D. [facility] [place] Office: [phone] Pager:  [phone]

## 2016-12-24 NOTE — Interval H&P Note (Signed)
History and Physical Interval Note:  12/24/2016 11:17 AM  [name]  has presented today for surgery, with the diagnosis of pvd with toe wound  The various methods of treatment have been discussed with the patient and family. After consideration of risks, benefits and other options for treatment, the patient has consented to  Procedure(s): ABDOMINAL AORTOGRAM W/LOWER EXTREMITY (N/A) as a surgical intervention .  The patient's history has been reviewed, patient examined, no change in status, stable for surgery.  I have reviewed the patient's chart and labs.  Questions were answered to the patient's satisfaction.     Annamarie Major

## 2016-12-24 NOTE — Progress Notes (Addendum)
Site area: LFA Site Prior to Removal:  Level 0 Pressure Applied For:30 min Manual:  yes  Patient Status During Pull:   Post Pull Site:  Level 0 Post Pull Instructions Given:  yes Post Pull Pulses Present: doppler Dressing Applied: tegaderm/pressure  Bedrest begins @ 1630 till 2030 Comments: Pt repeatedly has stated he will get up and go outside to smoke. He has repeatedly been advised not to do so.

## 2016-12-24 NOTE — H&P (View-Only) (Signed)
[facility]  Patient name: [name] MRN: [id number] DOB: [birth year] Sex: male   REASON FOR VISIT:    Follow up  HISOTRY OF PRESENT ILLNESS:    [name] is a 61 y.o. male who presents for continued follow-up of his right great toe amputation site and heel wound. He previously underwent angiography and angioplasty of the right anterior tibial, right superficial femoral and popliteal arteries as well as stenting of his right popliteal artery on 07/26/2016. He also underwent amputation of his right great toe with wound VAC placement at that time. He was last seen in the office on 09/02/2016. At that time, his amputation site was concerning for infection. He was placed on Bactrim for 10 days. He continued going to the wound center.  He states that his diabetes is now under better control with an A1c around 7.  He continues to take Harvoni for his hepatitis C.  He is continuing to smoke.  He gets follow-up at the wound center for his amputation site which is healing.  He is not having fevers or chills   PAST MEDICAL HISTORY:   Past Medical History:  Diagnosis Date  . DEPRESSION   . DIABETES MELLITUS, TYPE I   . DRUG ABUSE    pt should have NO controlled substances rx'ed  . Glaucoma   . HEPATITIS C    chronic  . Hypertension 02/18/2011  . 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 Vitiligo      FAMILY HISTORY:   Family History  Problem Relation Age of Onset  . Diabetes Father   . Kidney disease Father   . Arthritis Mother   . Heart disease Mother        CAD    SOCIAL HISTORY:   Social History  Substance Use Topics  . Smoking status: Current Every Day Smoker    Packs/day: 0.25    Types: Cigarettes  . Smokeless tobacco: Never Used     Comment: cutting back  . Alcohol use No     Comment: 08/14/14 none     ALLERGIES:   Allergies  Allergen Reactions  . Codeine Itching     CURRENT  MEDICATIONS:   Current Outpatient Prescriptions  Medication Sig Dispense Refill  . aspirin EC 81 MG EC tablet Take 1 tablet (81 mg total) by mouth daily. 180 tablet 0  . insulin NPH Human (NOVOLIN N) 100 UNIT/ML injection Inject 0.45 mLs (45 Units total) into the skin daily before breakfast. 20 mL 11  . Sofosbuvir-Velpatasvir (EPCLUSA) 400-100 MG TABS Take 1 tablet by mouth daily. 28 tablet 2   No current facility-administered medications for this visit.     REVIEW OF SYSTEMS:   [X]  denotes positive finding, [ ]  denotes negative finding Cardiac  Comments:  Chest pain or chest pressure:    Shortness of breath upon exertion:    Short of breath when lying flat:    Irregular heart rhythm:        Vascular    Pain in calf, thigh, or hip brought on by ambulation:    Pain in feet at night that wakes you up from your sleep:     Blood clot in your veins:    Leg swelling:         Pulmonary    Oxygen at home:    Productive cough:     Wheezing:         Neurologic    Sudden weakness in  arms or legs:     Sudden numbness in arms or legs:     Sudden onset of difficulty speaking or slurred speech:    Temporary loss of vision in one eye:     Problems with dizziness:         Gastrointestinal    Blood in stool:     Vomited blood:         Genitourinary    Burning when urinating:     Blood in urine:        Psychiatric    Major depression:         Hematologic    Bleeding problems:    Problems with blood clotting too easily:        Skin    Rashes or ulcers:        Constitutional    Fever or chills:      PHYSICAL EXAM:   Vitals:   12/09/16 1527  BP: (!) 156/76  Pulse: 73  SpO2: 98%  Weight: 169 lb 6.4 oz (76.8 kg)  Height: 6' (1.829 m)    GENERAL: The patient is a well-nourished male, in no acute distress. The vital signs are documented above. CARDIAC: There is a regular rate and rhythm.  VASCULAR: Pedal pulses are nonpalpable PULMONARY: Non-labored  respirations ABDOMEN: Soft and non-tender with normal pitched bowel sounds.  MUSCULOSKELETAL: There are no major deformities or cyanosis. NEUROLOGIC: No focal weakness or paresthesias are detected. SKIN: The amputation site continues to heal there is just a dry eschar there are now without erythema. PSYCHIATRIC: The patient has a normal affect.  STUDIES:   I have ordered and reviewed his vascular lab studies.  He has developed elevated velocities within the distal stent.  He has monophasic waveforms throughout the right leg.  There is a new stenosis in the distal popliteal artery and distal right femoral artery.  MEDICAL ISSUES:   I discussed the importance significance of the ultrasound findings above.  I think in order to preserve the recent interventions patency as well as to achieve wound healing of his great toe wound, we need to address the stenosis in his right femoral artery.  He will need to undergo angiography with intervention on the right leg if possible.  This can be done through left femoral access.  This is been scheduled for September 11.  I again stressed the importance of smoking cessation.    Annamarie Major, MD [facility] [phone] Pager [phone]

## 2016-12-25 ENCOUNTER — Encounter (HOSPITAL_COMMUNITY): Payer: Self-pay | Admitting: Surgery

## 2016-12-25 ENCOUNTER — Telehealth: Payer: Self-pay | Admitting: Surgery

## 2016-12-25 DIAGNOSIS — Z794 Long term (current) use of insulin: Secondary | ICD-10-CM | POA: Diagnosis not present

## 2016-12-25 DIAGNOSIS — L97919 Non-pressure chronic ulcer of unspecified part of right lower leg with unspecified severity: Secondary | ICD-10-CM | POA: Diagnosis not present

## 2016-12-25 DIAGNOSIS — B1920 Unspecified viral hepatitis C without hepatic coma: Secondary | ICD-10-CM | POA: Diagnosis not present

## 2016-12-25 DIAGNOSIS — Z8249 Family history of ischemic heart disease and other diseases of the circulatory system: Secondary | ICD-10-CM | POA: Diagnosis not present

## 2016-12-25 DIAGNOSIS — H409 Unspecified glaucoma: Secondary | ICD-10-CM | POA: Diagnosis not present

## 2016-12-25 DIAGNOSIS — Z89411 Acquired absence of right great toe: Secondary | ICD-10-CM | POA: Diagnosis not present

## 2016-12-25 DIAGNOSIS — E11621 Type 2 diabetes mellitus with foot ulcer: Secondary | ICD-10-CM | POA: Diagnosis not present

## 2016-12-25 DIAGNOSIS — I10 Essential (primary) hypertension: Secondary | ICD-10-CM | POA: Diagnosis not present

## 2016-12-25 DIAGNOSIS — E11622 Type 2 diabetes mellitus with other skin ulcer: Secondary | ICD-10-CM | POA: Diagnosis not present

## 2016-12-25 LAB — GLUCOSE, CAPILLARY: GLUCOSE-CAPILLARY: 79 mg/dL (ref 65–99)

## 2016-12-25 NOTE — Discharge Summary (Signed)
[facility] Discharge Summary   Patient ID:  [name] MRN: [id number] DOB/AGE: Dec 24, 1955 61 y.o.  Admit date: 12/24/2016 Discharge date: 12/25/2016 Date of Surgery: 12/24/2016 Surgeon: Surgeon(s): Serafina Mitchell, MD  Admission Diagnosis: pvd with toe wound  Discharge Diagnoses:  pvd with toe wound  Secondary Diagnoses: Past Medical History:  Diagnosis Date  . DEPRESSION   . DIABETES MELLITUS, TYPE I   . DRUG ABUSE    pt should have NO controlled substances rx'ed  . Glaucoma   . HEPATITIS C    chronic  . Hypertension 02/18/2011  . 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 Vitiligo     Procedure(s): ABDOMINAL AORTOGRAM W/LOWER EXTREMITY PERIPHERAL VASCULAR INTERVENTION  Discharged Condition: good  HPI: [name] a 61 y.o.malewho presents for continued follow-up of his right great toe amputation site and heel wound. He previously underwent angiography and angioplasty of the right anterior tibial, right superficial femoral and popliteal arteries as well as stenting of his right popliteal artery on 07/26/2016. He also underwent amputation of his right great toe with wound VAC placement at that time. He was last seen in the office on 09/02/2016. At that time, his amputation site was concerning for infection.He was placed on Bactrim for 10 days. He continued going to the wound center.  He states that his diabetes is now under better control with an A1c around 7.  He continues to take Harvoni for his hepatitis C.  He is continuing to smoke.  He gets follow-up at the wound center for his amputation site which is healing.  He is not having fevers or chills He has developed elevated velocities within the distal stent.  He has monophasic waveforms throughout the right leg.  There is a new stenosis in the distal popliteal artery and distal right femoral artery.   Hospital Course:  [name] is a 61 y.o. male is S/P  Procedure(s): ABDOMINAL  AORTOGRAM W/right LOWER EXTREMITY PERIPHERAL VASCULAR INTERVENTION      Doppler DP right LE Dressing removed right great toe amputation site healing well with residual hard yellow eschar.     Assessment/Planning: POD # 1 angiogram   Procedure Performed: 1. Ultrasound-guided access, left femoral artery 2. Abdominal aortogram 3. Right lower extremity runoff 4. Angioplasty, right anterior tibial artery 5. Angioplasty, right popliteal artery 6. Stent, right superficial femoral artery 7. Conscious sedation (95 minutes)  Impression: #1 near occlusion of the mid right anterior tibial artery and 50-60 percent stenosis within the anterior tibial artery at the interosseous membrane. The lesions were successfully dilated using a 3 mm balloon with no residual stenosis. #2 60-70% native popliteal artery stenosis successfully treated with a 6 mm balloon. There is also in-stent stenosis within the right popliteal artery stent successfully treated with a 6 mm balloon. #3 native stenosis of the right superficial femoral artery successfully stented using a 7 x 60 self-expanding stent with no residual stenosis.  Discharge home today.  Discussed his need to stop smoking and it's important's.     Significant Diagnostic Studies: CBC Lab Results  Component Value Date   WBC 8.9 08/28/2016   HGB 13.9 12/24/2016   HCT 41.0 12/24/2016   MCV 93.3 08/28/2016   PLT 385 08/28/2016    BMET    Component Value Date/Time   NA 139 12/24/2016 1027   K 4.0 12/24/2016 1027   CL 100 (L) 12/24/2016 1027   CO2 25 08/28/2016 1600   GLUCOSE 43 (LL) 12/24/2016 1027  BUN 16 12/24/2016 1027   CREATININE 1.10 12/24/2016 1027   CREATININE 1.04 08/28/2016 1600   CALCIUM 9.4 08/28/2016 1600   GFRNONAA 78 08/28/2016 1600   GFRAA >89 08/28/2016 1600   COAG Lab Results   Component Value Date   INR 0.9 08/28/2016   INR 1.00 07/25/2016     Disposition:  Discharge to :Home Discharge Instructions    Call MD for:  redness, tenderness, or signs of infection (pain, swelling, bleeding, redness, odor or green/yellow discharge around incision site)    Complete by:  As directed    Call MD for:  severe or increased pain, loss or decreased feeling  in affected limb(s)    Complete by:  As directed    Call MD for:  temperature >100.5    Complete by:  As directed    Discharge instructions    Complete by:  As directed    Shower daily, continue to follow up with out patient wound center.   Resume previous diet    Complete by:  As directed      Allergies as of 12/25/2016      Reactions   Codeine Itching      Medication List    TAKE these medications   aspirin 81 MG EC tablet Take 1 tablet (81 mg total) by mouth daily.   insulin NPH Human 100 UNIT/ML injection Commonly known as:  NOVOLIN N Inject 0.45 mLs (45 Units total) into the skin daily before breakfast. What changed:  how much to take   Sofosbuvir-Velpatasvir 400-100 MG Tabs Commonly known as:  EPCLUSA Take 1 tablet by mouth daily.            Discharge Care Instructions        Start     Ordered   12/25/16 0000  Resume previous diet     12/25/16 0759   12/25/16 0000  Call MD for:  temperature >100.5     12/25/16 0759   12/25/16 0000  Call MD for:  redness, tenderness, or signs of infection (pain, swelling, bleeding, redness, odor or green/yellow discharge around incision site)     12/25/16 0759   12/25/16 0000  Call MD for:  severe or increased pain, loss or decreased feeling  in affected limb(s)     12/25/16 0759   12/25/16 0000  Discharge instructions    Comments:  Shower daily, continue to follow up with out patient wound center.   12/25/16 0759     Verbal and written Discharge instructions given to the patient. Wound care per Discharge AVS Follow-up Information    Serafina Mitchell, MD Follow up in 4 week(s).   Specialties:  Vascular Surgery, Cardiology Contact information: [address] [place] [address] [phone]           Signed: Laurence Slate [facility] 12/25/2016, 1:08 PM

## 2016-12-25 NOTE — Progress Notes (Signed)
[facility] [place]  Subjective  - Doing well over all. Still wants to smoke.   Objective (!) 162/73 77 98.5 F (36.9 C) (Oral) (!) 27 98%  Intake/Output Summary (Last 24 hours) at 12/25/16 0745 Last data filed at 12/25/16 0401  Gross per 24 hour  Intake           791.56 ml  Output              500 ml  Net           291.56 ml    Doppler DP right LE Dressing removed right great toe amputation site healing well with residual hard yellow eschar.     Assessment/Planning: POD # 1 angiogram   Procedure Performed:             1.  Ultrasound-guided access, left femoral artery             2.  Abdominal aortogram             3.  Right lower extremity runoff             4.  Angioplasty, right anterior tibial artery             5.  Angioplasty, right popliteal artery             6.  Stent, right superficial femoral artery             7.  Conscious sedation (95 minutes)  Impression:             #1  near occlusion of the mid right anterior tibial artery and 50-60 percent stenosis within the anterior tibial artery at the interosseous membrane.  The lesions were successfully dilated using a 3 mm balloon with no residual stenosis.             #2  60-70% native popliteal artery stenosis successfully treated with a 6 mm balloon.  There is also in-stent stenosis within the right popliteal artery stent successfully treated with a 6 mm balloon.             #3  native stenosis of the right superficial femoral artery successfully stented using a 7 x 60 self-expanding stent with no residual stenosis.              Discharge home today.  Discussed his need to stop smoking and it's important's.    Laurence Slate [facility] 12/25/2016 7:45 AM --  Laboratory Lab Results:  Recent Labs  12/24/16 1027  HGB 13.9  HCT 41.0   BMET  Recent Labs  12/24/16 1027  NA 139  K 4.0  CL 100*  GLUCOSE 43*  BUN 16  CREATININE 1.10    COAG Lab Results  Component Value Date    INR 0.9 08/28/2016   INR 1.00 07/25/2016   No results found for: PTT

## 2016-12-25 NOTE — Telephone Encounter (Signed)
-----   Message from Mena Goes, RN sent at 12/24/2016  4:18 PM EDT ----- Regarding: 6 weeks w/ 2 labs and NP appt   ----- Message ----- From: Serafina Mitchell, MD Sent: 12/24/2016   3:53 PM To: [facility] Charge Pool  12/24/2016:  Surgeon:  Annamarie Major Procedure Performed:  1.  Ultrasound-guided access, left femoral artery  2.  Abdominal aortogram  3.  Right lower extremity runoff  4.  Angioplasty, right anterior tibial artery  5.  Angioplasty, right popliteal artery  6.  Stent, right superficial femoral artery  7.  Conscious sedation (95 minutes)  Follow-up with Vinnie Level in 6 weeks with ABIs and duplex of the right leg

## 2016-12-25 NOTE — Telephone Encounter (Signed)
Sched appt 02/05/17; lab at 2:30 and NP at 3:45. Spoke to pt.

## 2016-12-26 LAB — GLUCOSE, CAPILLARY: GLUCOSE-CAPILLARY: 37 mg/dL — AB (ref 65–99)

## 2016-12-30 ENCOUNTER — Encounter: Payer: Self-pay | Admitting: Pharmacy Technician

## 2017-01-01 DIAGNOSIS — S91101A Unspecified open wound of right great toe without damage to nail, initial encounter: Secondary | ICD-10-CM | POA: Diagnosis not present

## 2017-01-02 DIAGNOSIS — L97512 Non-pressure chronic ulcer of other part of right foot with fat layer exposed: Secondary | ICD-10-CM | POA: Diagnosis not present

## 2017-01-02 DIAGNOSIS — E1052 Type 1 diabetes mellitus with diabetic peripheral angiopathy with gangrene: Secondary | ICD-10-CM | POA: Diagnosis not present

## 2017-01-02 DIAGNOSIS — E10621 Type 1 diabetes mellitus with foot ulcer: Secondary | ICD-10-CM | POA: Diagnosis not present

## 2017-01-02 DIAGNOSIS — E1051 Type 1 diabetes mellitus with diabetic peripheral angiopathy without gangrene: Secondary | ICD-10-CM | POA: Diagnosis not present

## 2017-01-02 DIAGNOSIS — J449 Chronic obstructive pulmonary disease, unspecified: Secondary | ICD-10-CM | POA: Diagnosis not present

## 2017-01-02 DIAGNOSIS — Y835 Amputation of limb(s) as the cause of abnormal reaction of the patient, or of later complication, without mention of misadventure at the time of the procedure: Secondary | ICD-10-CM | POA: Diagnosis not present

## 2017-01-02 DIAGNOSIS — T8189XA Other complications of procedures, not elsewhere classified, initial encounter: Secondary | ICD-10-CM | POA: Diagnosis not present

## 2017-01-02 DIAGNOSIS — Z89411 Acquired absence of right great toe: Secondary | ICD-10-CM | POA: Diagnosis not present

## 2017-01-02 DIAGNOSIS — I10 Essential (primary) hypertension: Secondary | ICD-10-CM | POA: Diagnosis not present

## 2017-01-02 DIAGNOSIS — E1042 Type 1 diabetes mellitus with diabetic polyneuropathy: Secondary | ICD-10-CM | POA: Diagnosis not present

## 2017-01-07 ENCOUNTER — Ambulatory Visit (INDEPENDENT_AMBULATORY_CARE_PROVIDER_SITE_OTHER): Payer: Medicare HMO | Admitting: Endocrinology

## 2017-01-07 ENCOUNTER — Encounter: Payer: Self-pay | Admitting: Endocrinology

## 2017-01-07 VITALS — BP 136/62 | HR 93 | Wt 163.6 lb

## 2017-01-07 DIAGNOSIS — E1065 Type 1 diabetes mellitus with hyperglycemia: Secondary | ICD-10-CM | POA: Diagnosis not present

## 2017-01-07 DIAGNOSIS — N181 Chronic kidney disease, stage 1: Secondary | ICD-10-CM

## 2017-01-07 DIAGNOSIS — E1022 Type 1 diabetes mellitus with diabetic chronic kidney disease: Secondary | ICD-10-CM

## 2017-01-07 DIAGNOSIS — IMO0002 Reserved for concepts with insufficient information to code with codable children: Secondary | ICD-10-CM

## 2017-01-07 DIAGNOSIS — Z23 Encounter for immunization: Secondary | ICD-10-CM

## 2017-01-07 LAB — POCT GLYCOSYLATED HEMOGLOBIN (HGB A1C): Hemoglobin A1C: 7.4

## 2017-01-07 LAB — GLUCOSE, POCT (MANUAL RESULT ENTRY): POC GLUCOSE: 324 mg/dL — AB (ref 70–99)

## 2017-01-07 MED ORDER — INSULIN NPH (HUMAN) (ISOPHANE) 100 UNIT/ML ~~LOC~~ SUSP: 45.0000 [IU] | Freq: Every day | SUBCUTANEOUS | 11 refills | Status: DC

## 2017-01-07 NOTE — Patient Instructions (Addendum)
check your blood sugar twice a day.  vary the time of day when you check, between before the 3 meals, and at bedtime.  also check if you have symptoms of your blood sugar being too high or too low.  please keep a record of the readings and bring it to your next appointment here (or you can bring the meter itself).  You can write it on any piece of paper.  please call us sooner if your blood sugar goes below 70, or if you have a lot of readings over 200. Please reduce the NPH insulin to 45 units each morning, and:  On this type of insulin schedule, you should eat meals on a regular schedule.  If a meal is missed or significantly.  delayed, your blood sugar could go low.   please call [phone] ([place] physician referral line), to get an appointment with a new primary doctor.   Please come back for a follow-up appointment in 3 months.

## 2017-01-07 NOTE — Progress Notes (Signed)
Subjective:    Patient ID: [name], male    DOB: 06-Jun-1955, 61 y.o.   MRN: [id number]  HPI Pt returns for f/u of diabetes mellitus: DM type: 1 Dx'ed: 4098 Complications: polyneuropathy, nephropathy, toe amputation, and retinopathy.   Therapy: insulin since dx.   DKA: never Severe hypoglycemia: multiple episodes (last was in 2018).   Pancreatitis: never Other: he has done better with a qd insulin regimen; he was changed from lantus to levemir, due to mild hypoglycemia in the early hours of the morning; therapy limited by ongoing use of heroin, EtOH, and cocaine; he says he cannot afford insulin analogs.  Interval history: Pt says he had an episode of severe hypoglycemia, this am fasting.  He takes 50 units qam.  no cbg record, but states cbg's are well-controlled.   Past Medical History:  Diagnosis Date  . DEPRESSION   . DIABETES MELLITUS, TYPE I   . DRUG ABUSE    pt should have NO controlled substances rx'ed  . Glaucoma   . HEPATITIS C    chronic  . Hypertension 02/18/2011  . 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 Vitiligo     Past Surgical History:  Procedure Laterality Date  . ABDOMINAL AORTOGRAM W/LOWER EXTREMITY N/A 07/25/2016   Procedure: Abdominal Aortogram w/Bilateral Lower Extremity Runoff;  Surgeon: Conrad Arthur, MD;  Location: [facility];  Service: Cardiovascular;  Laterality: N/A;  . ABDOMINAL AORTOGRAM W/LOWER EXTREMITY N/A 12/24/2016   Procedure: ABDOMINAL AORTOGRAM W/LOWER EXTREMITY;  Surgeon: Serafina Mitchell, MD;  Location: [facility];  Service: Cardiovascular;  Laterality: N/A;  . AMPUTATION TOE Right 07/26/2016   Procedure: AMPUTATION GREAT TOE;  Surgeon: Serafina Mitchell, MD;  Location: [facility];  Service: Vascular;  Laterality: Right;  . EYE SURGERY     retinal surgery x 2, right eye  . INSERTION OF ILIAC STENT Right 07/26/2016   Procedure: INSERTION OF RIGHT POPLITEAL STENT WITH BALLOON ANGIOPLASTY;  Surgeon: Serafina Mitchell, MD;   Location: [facility];  Service: Vascular;  Laterality: Right;  . LOWER EXTREMITY ANGIOGRAM Right 07/26/2016   Procedure: LOWER EXTREMITY ANGIOGRAM;  Surgeon: Serafina Mitchell, MD;  Location: [facility];  Service: Vascular;  Laterality: Right;  . PERIPHERAL VASCULAR BALLOON ANGIOPLASTY Right 07/26/2016   Procedure: PERIPHERAL VASCULAR BALLOON ANGIOPLASTY  RIGHT ANTERIOR TIBEAL ARTERY AND RIGHT SUPERFICIAL FEMORAL ARTERY;  Surgeon: Serafina Mitchell, MD;  Location: [facility];  Service: Vascular;  Laterality: Right;  . PERIPHERAL VASCULAR INTERVENTION Right 12/24/2016   Procedure: PERIPHERAL VASCULAR INTERVENTION;  Surgeon: Serafina Mitchell, MD;  Location: [facility];  Service: Cardiovascular;  Laterality: Right;  lower extr    Social History   Social History  . Marital status: Single    Spouse name: N/A  . Number of children: N/A  . Years of education: N/A   Occupational History  .  Unemployed    disabled   Social History Main Topics  . Smoking status: Current Every Day Smoker    Packs/day: 0.25    Types: Cigarettes  . Smokeless tobacco: Never Used     Comment: cutting back  . Alcohol use No     Comment: 08/14/14 none  . Drug use: Yes    Types: "Crack" cocaine, Marijuana, Heroin     Comment: s/p rehab x 6, occasional drug use (per pt last  use was 06/2015)  . Sexual activity: Not Currently   Other Topics Concern  . Not on file  Social History Narrative   Lives in boarding house/homeless   Ongoing occ drug use-crack   Single, disabled, prev mental health drug counselor.     Current Outpatient Prescriptions on File Prior to Visit  Medication Sig Dispense Refill  . aspirin EC 81 MG EC tablet Take 1 tablet (81 mg total) by mouth daily. 180 tablet 0  . Sofosbuvir-Velpatasvir (EPCLUSA) 400-100 MG TABS Take 1 tablet by mouth daily. (Patient not taking: Reported on 01/07/2017) 28 tablet 2   No current facility-administered medications on file prior to visit.     Allergies  Allergen  Reactions  . Codeine Itching    Family History  Problem Relation Age of Onset  . Diabetes Father   . Kidney disease Father   . Arthritis Mother   . Heart disease Mother        CAD    BP 136/62   Pulse 93   Wt 163 lb 9.6 oz (74.2 kg)   SpO2 97%   BMI 22.19 kg/m    Review of Systems No weight change    Objective:   Physical Exam VITAL SIGNS:  See vs page GENERAL: no distress Pulses: left foot pulses is intact.   MSK: no deformity of the left foot or ankle  CV: no edema of the legs or ankles Skin:  no ulcer on the left foot and ankle.  normal color and temp on the left foot and ankle Neuro: sensation is intact to touch on the left foot and ankle, but severely decreased from normal. Ext: Right foot is bandaged  Lab Results  Component Value Date   HGBA1C 7.4 01/07/2017      Assessment & Plan:  Type 1 DM, with DR: overcontrolled, this is the best control this pt should aim for, given this regimen, which does match insulin to his changing needs throughout the day  Patient Instructions  check your blood sugar twice a day.  vary the time of day when you check, between before the 3 meals, and at bedtime.  also check if you have symptoms of your blood sugar being too high or too low.  please keep a record of the readings and bring it to your next appointment here (or you can bring the meter itself).  You can write it on any piece of paper.  please call us sooner if your blood sugar goes below 70, or if you have a lot of readings over 200. Please reduce the NPH insulin to 45 units each morning, and:  On this type of insulin schedule, you should eat meals on a regular schedule.  If a meal is missed or significantly.  delayed, your blood sugar could go low.   please call [phone] ([facility] physician referral line), to get an appointment with a new primary doctor.   Please come back for a follow-up appointment in 3 months.

## 2017-01-08 MED FILL — Dextrose Inj 50%: INTRAVENOUS | Qty: 50 | Status: AC

## 2017-01-09 ENCOUNTER — Ambulatory Visit: Payer: Medicare HMO

## 2017-01-09 ENCOUNTER — Ambulatory Visit (INDEPENDENT_AMBULATORY_CARE_PROVIDER_SITE_OTHER): Payer: Medicare HMO | Admitting: Pharmacist

## 2017-01-09 DIAGNOSIS — Z89411 Acquired absence of right great toe: Secondary | ICD-10-CM | POA: Diagnosis not present

## 2017-01-09 DIAGNOSIS — B182 Chronic viral hepatitis C: Secondary | ICD-10-CM | POA: Diagnosis not present

## 2017-01-09 DIAGNOSIS — L97512 Non-pressure chronic ulcer of other part of right foot with fat layer exposed: Secondary | ICD-10-CM | POA: Diagnosis not present

## 2017-01-09 DIAGNOSIS — E1042 Type 1 diabetes mellitus with diabetic polyneuropathy: Secondary | ICD-10-CM | POA: Diagnosis not present

## 2017-01-09 DIAGNOSIS — I10 Essential (primary) hypertension: Secondary | ICD-10-CM | POA: Diagnosis not present

## 2017-01-09 DIAGNOSIS — J449 Chronic obstructive pulmonary disease, unspecified: Secondary | ICD-10-CM | POA: Diagnosis not present

## 2017-01-09 DIAGNOSIS — E1052 Type 1 diabetes mellitus with diabetic peripheral angiopathy with gangrene: Secondary | ICD-10-CM | POA: Diagnosis not present

## 2017-01-09 DIAGNOSIS — T8189XA Other complications of procedures, not elsewhere classified, initial encounter: Secondary | ICD-10-CM | POA: Diagnosis not present

## 2017-01-09 DIAGNOSIS — E10621 Type 1 diabetes mellitus with foot ulcer: Secondary | ICD-10-CM | POA: Diagnosis not present

## 2017-01-09 DIAGNOSIS — E1051 Type 1 diabetes mellitus with diabetic peripheral angiopathy without gangrene: Secondary | ICD-10-CM | POA: Diagnosis not present

## 2017-01-09 NOTE — Addendum Note (Signed)
Addended by: Darletta Moll on: 01/09/2017 04:49 PM   Modules accepted: Orders

## 2017-01-09 NOTE — Progress Notes (Signed)
HPI: [name] is a 61 y.o. male who presents to the [facility] clinic to follow-up for his Hep C infection.  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  Lab Results  Component Value Date   HCVGENOTYPE 2 08/28/2016    Allergies: Allergies  Allergen Reactions  . Codeine Itching    Past Medical History: Past Medical History:  Diagnosis Date  . DEPRESSION   . DIABETES MELLITUS, TYPE I   . DRUG ABUSE    pt should have NO controlled substances rx'ed  . Glaucoma   . HEPATITIS C    chronic  . Hypertension 02/18/2011  . 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 Vitiligo     Social History: Social History   Social History  . Marital status: Single    Spouse name: N/A  . Number of children: N/A  . Years of education: N/A   Occupational History  .  Unemployed    disabled   Social History Main Topics  . Smoking status: Current Every Day Smoker    Packs/day: 0.25    Types: Cigarettes  . Smokeless tobacco: Never Used     Comment: cutting back  . Alcohol use No     Comment: 08/14/14 none  . Drug use: Yes    Types: "Crack" cocaine, Marijuana, Heroin     Comment: s/p rehab x 6, occasional drug use (per pt last  use was 06/2015)  . Sexual activity: Not Currently   Other Topics Concern  . Not on file   Social History Narrative   Lives in boarding house/homeless   Ongoing occ drug use-crack   Single, disabled, prev mental health drug counselor.     Labs: Hep B S Ab (no units)  Date Value  08/28/2016 NEG   Hepatitis B Surface Ag (no units)  Date Value  08/28/2016 NEGATIVE   HCV Ab (s/co ratio)  Date Value  07/24/2016 >11.0 (H)    Lab Results  Component Value Date   HCVGENOTYPE 2 08/28/2016    Hepatitis C RNA quantitative Latest Ref Rng & Units 10/23/2016 07/26/2016 07/12/2015  HCV Quantitative NOT DETECTED IU/mL <15 NOT DETECTED 45,300 260,868(H)  HCV Quantitative Log NOT DETECTED Log IU/mL <1.18 NOT DETECTED 4.656 5.42(H)     AST (U/L)  Date Value  08/28/2016 24  07/24/2016 25  07/22/2016 30   ALT (U/L)  Date Value  08/28/2016 26  08/28/2016 26  07/24/2016 23  07/22/2016 23   INR (no units)  Date Value  08/28/2016 0.9  07/25/2016 1.00    CrCl: Estimated Creatinine Clearance: 74.9 mL/min (by C-G formula based on SCr of 1.1 mg/dL).  Fibrosis Score: F0/F1 as assessed by fibrosure   Child-Pugh Score: A  Previous Treatment Regimen: None  Assessment: [name] is here today to follow-up for his Hep C infection.  He has completed 12 weeks of Epclusa without any issues.  He states he missed no doses throughout the 12 weeks - only taking it late on 2 occasions. He had no side effects or issues. His early on treatment viral load was already undetectable. I will get EOT labs today and have him follow-up in 3-4 months for his cure visit.    Plans: - HCV RNA today - F/u with me again 04/17/17 at 2pm  [name] L. Shakerria Parran, PharmD, [facility] 01/09/2017, 4:26 PM

## 2017-01-11 LAB — HEPATITIS C RNA QUANTITATIVE
HCV Quantitative Log: <1.18 Log IU/mL
HCV RNA, PCR, QN: NOT DETECTED [IU]/mL

## 2017-01-16 ENCOUNTER — Other Ambulatory Visit: Payer: Self-pay

## 2017-01-16 DIAGNOSIS — I739 Peripheral vascular disease, unspecified: Secondary | ICD-10-CM

## 2017-02-05 ENCOUNTER — Encounter (HOSPITAL_COMMUNITY): Payer: Medicare HMO

## 2017-02-05 ENCOUNTER — Ambulatory Visit: Payer: Medicare HMO | Admitting: Family

## 2017-02-17 ENCOUNTER — Encounter (HOSPITAL_COMMUNITY): Payer: Medicare HMO

## 2017-02-18 ENCOUNTER — Ambulatory Visit: Payer: Medicare HMO | Admitting: Family

## 2017-02-28 ENCOUNTER — Encounter (HOSPITAL_COMMUNITY): Payer: Medicare HMO

## 2017-02-28 ENCOUNTER — Ambulatory Visit: Payer: Medicare HMO | Admitting: Family

## 2017-04-05 DIAGNOSIS — E108 Type 1 diabetes mellitus with unspecified complications: Secondary | ICD-10-CM | POA: Diagnosis not present

## 2017-04-17 ENCOUNTER — Ambulatory Visit: Payer: Medicare HMO

## 2017-04-18 ENCOUNTER — Encounter (HOSPITAL_COMMUNITY): Payer: Self-pay | Admitting: Emergency Medicine

## 2017-04-18 ENCOUNTER — Ambulatory Visit: Payer: Medicare HMO | Admitting: Endocrinology

## 2017-04-18 ENCOUNTER — Emergency Department (HOSPITAL_COMMUNITY): Payer: Medicare HMO

## 2017-04-18 ENCOUNTER — Ambulatory Visit: Payer: Medicare HMO

## 2017-04-18 ENCOUNTER — Inpatient Hospital Stay (HOSPITAL_COMMUNITY)
Admission: EM | Admit: 2017-04-18 | Discharge: 2017-04-21 | DRG: 091 | Discharge status: Against Medical Advice | Payer: Medicare HMO | Attending: Internal Medicine | Admitting: Internal Medicine

## 2017-04-18 DIAGNOSIS — G9340 Encephalopathy, unspecified: Secondary | ICD-10-CM | POA: Diagnosis not present

## 2017-04-18 DIAGNOSIS — Z794 Long term (current) use of insulin: Secondary | ICD-10-CM

## 2017-04-18 DIAGNOSIS — R296 Repeated falls: Secondary | ICD-10-CM | POA: Diagnosis not present

## 2017-04-18 DIAGNOSIS — E1029 Type 1 diabetes mellitus with other diabetic kidney complication: Secondary | ICD-10-CM | POA: Diagnosis present

## 2017-04-18 DIAGNOSIS — W19XXXA Unspecified fall, initial encounter: Secondary | ICD-10-CM

## 2017-04-18 DIAGNOSIS — F17210 Nicotine dependence, cigarettes, uncomplicated: Secondary | ICD-10-CM | POA: Diagnosis present

## 2017-04-18 DIAGNOSIS — IMO0002 Reserved for concepts with insufficient information to code with codable children: Secondary | ICD-10-CM | POA: Diagnosis present

## 2017-04-18 DIAGNOSIS — F1910 Other psychoactive substance abuse, uncomplicated: Secondary | ICD-10-CM | POA: Diagnosis not present

## 2017-04-18 DIAGNOSIS — E109 Type 1 diabetes mellitus without complications: Secondary | ICD-10-CM | POA: Diagnosis present

## 2017-04-18 DIAGNOSIS — E103599 Type 1 diabetes mellitus with proliferative diabetic retinopathy without macular edema, unspecified eye: Secondary | ICD-10-CM | POA: Diagnosis present

## 2017-04-18 DIAGNOSIS — D72829 Elevated white blood cell count, unspecified: Secondary | ICD-10-CM | POA: Diagnosis present

## 2017-04-18 DIAGNOSIS — I739 Peripheral vascular disease, unspecified: Secondary | ICD-10-CM | POA: Diagnosis present

## 2017-04-18 DIAGNOSIS — N182 Chronic kidney disease, stage 2 (mild): Secondary | ICD-10-CM | POA: Diagnosis present

## 2017-04-18 DIAGNOSIS — I10 Essential (primary) hypertension: Secondary | ICD-10-CM | POA: Diagnosis not present

## 2017-04-18 DIAGNOSIS — E10621 Type 1 diabetes mellitus with foot ulcer: Secondary | ICD-10-CM | POA: Diagnosis present

## 2017-04-18 DIAGNOSIS — I129 Hypertensive chronic kidney disease with stage 1 through stage 4 chronic kidney disease, or unspecified chronic kidney disease: Secondary | ICD-10-CM | POA: Diagnosis present

## 2017-04-18 DIAGNOSIS — Z7982 Long term (current) use of aspirin: Secondary | ICD-10-CM

## 2017-04-18 DIAGNOSIS — E871 Hypo-osmolality and hyponatremia: Secondary | ICD-10-CM | POA: Diagnosis present

## 2017-04-18 DIAGNOSIS — L97409 Non-pressure chronic ulcer of unspecified heel and midfoot with unspecified severity: Secondary | ICD-10-CM | POA: Diagnosis present

## 2017-04-18 DIAGNOSIS — N179 Acute kidney failure, unspecified: Secondary | ICD-10-CM | POA: Diagnosis not present

## 2017-04-18 DIAGNOSIS — E1022 Type 1 diabetes mellitus with diabetic chronic kidney disease: Secondary | ICD-10-CM | POA: Diagnosis present

## 2017-04-18 DIAGNOSIS — M79674 Pain in right toe(s): Secondary | ICD-10-CM | POA: Diagnosis not present

## 2017-04-18 DIAGNOSIS — E872 Acidosis: Secondary | ICD-10-CM | POA: Diagnosis not present

## 2017-04-18 DIAGNOSIS — L97412 Non-pressure chronic ulcer of right heel and midfoot with fat layer exposed: Secondary | ICD-10-CM | POA: Diagnosis not present

## 2017-04-18 DIAGNOSIS — R739 Hyperglycemia, unspecified: Secondary | ICD-10-CM | POA: Diagnosis not present

## 2017-04-18 DIAGNOSIS — E1069 Type 1 diabetes mellitus with other specified complication: Secondary | ICD-10-CM | POA: Diagnosis not present

## 2017-04-18 DIAGNOSIS — H409 Unspecified glaucoma: Secondary | ICD-10-CM | POA: Diagnosis present

## 2017-04-18 DIAGNOSIS — Z885 Allergy status to narcotic agent status: Secondary | ICD-10-CM | POA: Diagnosis not present

## 2017-04-18 DIAGNOSIS — R4020 Unspecified coma: Secondary | ICD-10-CM | POA: Diagnosis not present

## 2017-04-18 DIAGNOSIS — Z89411 Acquired absence of right great toe: Secondary | ICD-10-CM | POA: Diagnosis not present

## 2017-04-18 DIAGNOSIS — I959 Hypotension, unspecified: Secondary | ICD-10-CM | POA: Diagnosis present

## 2017-04-18 DIAGNOSIS — E1065 Type 1 diabetes mellitus with hyperglycemia: Secondary | ICD-10-CM | POA: Diagnosis present

## 2017-04-18 DIAGNOSIS — G92 Toxic encephalopathy: Principal | ICD-10-CM | POA: Diagnosis present

## 2017-04-18 DIAGNOSIS — E108 Type 1 diabetes mellitus with unspecified complications: Secondary | ICD-10-CM | POA: Diagnosis not present

## 2017-04-18 DIAGNOSIS — N170 Acute kidney failure with tubular necrosis: Secondary | ICD-10-CM | POA: Diagnosis not present

## 2017-04-18 DIAGNOSIS — R4182 Altered mental status, unspecified: Secondary | ICD-10-CM | POA: Diagnosis not present

## 2017-04-18 DIAGNOSIS — E861 Hypovolemia: Secondary | ICD-10-CM | POA: Diagnosis present

## 2017-04-18 DIAGNOSIS — E1051 Type 1 diabetes mellitus with diabetic peripheral angiopathy without gangrene: Secondary | ICD-10-CM | POA: Diagnosis present

## 2017-04-18 DIAGNOSIS — B182 Chronic viral hepatitis C: Secondary | ICD-10-CM | POA: Diagnosis present

## 2017-04-18 LAB — ETHANOL: Alcohol, Ethyl (B): <10 mg/dL (ref <10)

## 2017-04-18 LAB — URINALYSIS, ROUTINE W REFLEX MICROSCOPIC
BACTERIA UA: NONE SEEN
BILIRUBIN URINE: NEGATIVE
Glucose, UA: >=500 mg/dL — AB
HGB URINE DIPSTICK: NEGATIVE
Ketones, ur: NEGATIVE mg/dL
LEUKOCYTES UA: NEGATIVE
Nitrite: NEGATIVE
Protein, ur: >=300 mg/dL — AB
Specific Gravity, Urine: 1.026 (ref 1.005–1.030)
pH: 6 (ref 5.0–8.0)

## 2017-04-18 LAB — CBC WITH DIFFERENTIAL/PLATELET
BASOS ABS: 0 10*3/uL (ref 0.0–0.1)
Basophils Relative: 0 %
Eosinophils Absolute: 0 10*3/uL (ref 0.0–0.7)
Eosinophils Relative: 0 %
HEMATOCRIT: 43.7 % (ref 39.0–52.0)
Hemoglobin: 14.8 g/dL (ref 13.0–17.0)
LYMPHS PCT: 7 %
Lymphs Abs: 1.4 10*3/uL (ref 0.7–4.0)
MCH: 29.7 pg (ref 26.0–34.0)
MCHC: 33.9 g/dL (ref 30.0–36.0)
MCV: 87.6 fL (ref 78.0–100.0)
MONO ABS: 1.4 10*3/uL — AB (ref 0.1–1.0)
MONOS PCT: 7 %
NEUTROS ABS: 16.6 10*3/uL — AB (ref 1.7–7.7)
Neutrophils Relative %: 86 %
Platelets: 326 10*3/uL (ref 150–400)
RBC: 4.99 MIL/uL (ref 4.22–5.81)
RDW: 13.3 % (ref 11.5–15.5)
WBC: 19.4 10*3/uL — ABNORMAL HIGH (ref 4.0–10.5)

## 2017-04-18 LAB — I-STAT BETA HCG BLOOD, ED (MC, WL, AP ONLY): I-stat hCG, quantitative: <5 m[IU]/mL (ref <5)

## 2017-04-18 LAB — COMPREHENSIVE METABOLIC PANEL
ALBUMIN: 3 g/dL — AB (ref 3.5–5.0)
ALT: 17 U/L (ref 17–63)
ANION GAP: 20 — AB (ref 5–15)
AST: 33 U/L (ref 15–41)
Alkaline Phosphatase: 78 U/L (ref 38–126)
BUN: 47 mg/dL — ABNORMAL HIGH (ref 6–20)
CO2: 26 mmol/L (ref 22–32)
Calcium: 8.7 mg/dL — ABNORMAL LOW (ref 8.9–10.3)
Chloride: 85 mmol/L — ABNORMAL LOW (ref 101–111)
Creatinine, Ser: 2.42 mg/dL — ABNORMAL HIGH (ref 0.61–1.24)
GFR calc Af Amer: 32 mL/min — ABNORMAL LOW (ref >60)
GFR calc non Af Amer: 27 mL/min — ABNORMAL LOW (ref >60)
GLUCOSE: 666 mg/dL — AB (ref 65–99)
POTASSIUM: 3.5 mmol/L (ref 3.5–5.1)
SODIUM: 131 mmol/L — AB (ref 135–145)
TOTAL PROTEIN: 6.5 g/dL (ref 6.5–8.1)
Total Bilirubin: 1.2 mg/dL (ref 0.3–1.2)

## 2017-04-18 LAB — CBG MONITORING, ED
Glucose-Capillary: >600 mg/dL (ref 65–99)
Glucose-Capillary: >600 mg/dL (ref 65–99)

## 2017-04-18 LAB — I-STAT TROPONIN, ED: TROPONIN I, POC: 0.03 ng/mL (ref 0.00–0.08)

## 2017-04-18 LAB — I-STAT CG4 LACTIC ACID, ED: Lactic Acid, Venous: 11.32 mmol/L (ref 0.5–1.9)

## 2017-04-18 MED ORDER — SODIUM CHLORIDE 0.9 % IV BOLUS (SEPSIS): 1000.0000 mL | Freq: Once | INTRAVENOUS | Status: DC

## 2017-04-18 MED ORDER — VANCOMYCIN HCL IN DEXTROSE 1-5 GM/200ML-% IV SOLN
1000.0000 mg | Freq: Once | INTRAVENOUS | Status: AC
  Administered 2017-04-18: 1000 mg via INTRAVENOUS
  Filled 2017-04-18: qty 200

## 2017-04-18 MED ORDER — PIPERACILLIN-TAZOBACTAM 3.375 G IVPB 30 MIN
3.3750 g | Freq: Once | INTRAVENOUS | Status: AC
  Administered 2017-04-18: 3.375 g via INTRAVENOUS
  Filled 2017-04-18: qty 50

## 2017-04-18 MED ORDER — SODIUM CHLORIDE 0.9 % IV BOLUS (SEPSIS)
30.0000 mL/kg | Freq: Once | INTRAVENOUS | Status: AC
  Administered 2017-04-18: 2178 mL via INTRAVENOUS

## 2017-04-18 MED ORDER — DEXTROSE-NACL 5-0.45 % IV SOLN: INTRAVENOUS | Status: DC

## 2017-04-18 MED ORDER — SODIUM CHLORIDE 0.9 % IV SOLN
INTRAVENOUS | Status: DC
  Administered 2017-04-18: 5.4 [IU]/h via INTRAVENOUS
  Filled 2017-04-18: qty 1

## 2017-04-18 NOTE — ED Triage Notes (Signed)
Patient BIB [facility] from home. Pts friends/neighbors called EMS due to AMS pt not acting himself, they checked his sugar with home monitor and read HIGH. EMS reports initial BP of 85/50 and CBG of HIGH. 1L bolus given. Pt has hx of DM but usually runs low.

## 2017-04-18 NOTE — ED Provider Notes (Addendum)
[facility] DEPT Provider Note   CSN: [id number] Arrival date & time: 04/18/17  2014     History   Chief Complaint Chief Complaint  Patient presents with  . Hypotension  . Hyperglycemia    HPI [name] is a 62 y.o. male.  The history is provided by the patient and the EMS personnel. No language interpreter was used.  Hyperglycemia   [name] is a 62 y.o. male who presents to the Emergency Department complaining of altered mental status.  Level 5 caveat due to altered mental status.  Patient states he has been feeling sick for the last 4 days and that he had a seizure 4 days ago when his sugar was low but he refused transport at that time.  He is not sure how or why he arrived to the emergency department today but he feels very ill.  He is not sure if he is taking his insulin or not.  He feels like he has the flu.  He has a history of alcohol abuse but has not had any alcohol for the last several years.  He does occasionally use heroin and marijuana.  On repeat assessment patient reports recently smoking crack cocaine as well as snorting heroin.  He also endorses injecting mildly.  Patient's roommate states that the patient has been falling frequently over the last 2 days and has hit his head multiple times. Past Medical History:  Diagnosis Date  . DEPRESSION   . DIABETES MELLITUS, TYPE I   . DRUG ABUSE    pt should have NO controlled substances rx'ed  . Glaucoma   . HEPATITIS C    chronic  . Hypertension 02/18/2011  . 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 Vitiligo     Patient Active Problem List   Diagnosis Date Noted  . Hemangioma 10/02/2016  . Foreign body (FB) in soft tissue   . 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[place])   . Gangrene ([place])   . Diabetes mellitus with complication ([place])   . Tobacco abuse   . Essential hypertension   . Diabetic infection of right foot ([place]) 07/22/2016  . Diabetic foot infection ([place])  07/22/2016  . Chronic ulcer of heel, right, with fat layer exposed ([place]) 05/20/2016  . DKA, type 1 ([place]) 04/18/2016  . Severe protein-calorie malnutrition ([place]) 04/18/2016  . Elevated troponin 04/18/2016  . Poor dentition 07/12/2015  . Tobacco use 07/06/2013  . PAD (peripheral artery disease) ([place]) 04/29/2013  . COPD, mild ([place])   . 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 Diabetic neuropathy ([place]) 06/21/2010  . PROLIFERATIVE DIABETIC RETINOPATHY 06/21/2010  . SKIN TAG 01/30/2010  . Chronic hepatitis C ([place]) 11/22/2008  . VITILIGO 11/22/2008  . PROTEINURIA, MILD 11/22/2008  . Substance abuse ([place]) 04/23/2007  . DEPRESSION 11/11/2006    Past Surgical History:  Procedure Laterality Date  . ABDOMINAL AORTOGRAM W/LOWER EXTREMITY N/A 07/25/2016   Procedure: Abdominal Aortogram w/Bilateral Lower Extremity Runoff;  Surgeon: Conrad Farmington, MD;  Location: [facility];  Service: Cardiovascular;  Laterality: N/A;  . ABDOMINAL AORTOGRAM W/LOWER EXTREMITY N/A 12/24/2016   Procedure: ABDOMINAL AORTOGRAM W/LOWER EXTREMITY;  Surgeon: Serafina Mitchell, MD;  Location: [facility];  Service: Cardiovascular;  Laterality: N/A;  . AMPUTATION TOE Right 07/26/2016   Procedure: AMPUTATION GREAT TOE;  Surgeon: Serafina Mitchell, MD;  Location: [facility];  Service: Vascular;  Laterality: Right;  . EYE SURGERY  retinal surgery x 2, right eye  . INSERTION OF ILIAC STENT Right 07/26/2016   Procedure: INSERTION OF RIGHT POPLITEAL STENT WITH BALLOON ANGIOPLASTY;  Surgeon: Serafina Mitchell, MD;  Location: [facility];  Service: Vascular;  Laterality: Right;  . LOWER EXTREMITY ANGIOGRAM Right 07/26/2016   Procedure: LOWER EXTREMITY ANGIOGRAM;  Surgeon: Serafina Mitchell, MD;  Location: [facility];  Service: Vascular;  Laterality: Right;  . PERIPHERAL VASCULAR BALLOON ANGIOPLASTY Right 07/26/2016   Procedure: PERIPHERAL VASCULAR BALLOON ANGIOPLASTY  RIGHT ANTERIOR TIBEAL ARTERY AND  RIGHT SUPERFICIAL FEMORAL ARTERY;  Surgeon: Serafina Mitchell, MD;  Location: [facility];  Service: Vascular;  Laterality: Right;  . PERIPHERAL VASCULAR INTERVENTION Right 12/24/2016   Procedure: PERIPHERAL VASCULAR INTERVENTION;  Surgeon: Serafina Mitchell, MD;  Location: [facility];  Service: Cardiovascular;  Laterality: Right;  lower extr       Home Medications    Prior to Admission medications   Medication Sig Start Date End Date Taking? Authorizing Provider  aspirin EC 81 MG EC tablet Take 1 tablet (81 mg total) by mouth daily. 07/30/16   Lavina Hamman, MD  insulin NPH Human (NOVOLIN N) 100 UNIT/ML injection Inject 0.45 mLs (45 Units total) into the skin daily before breakfast. 01/07/17   Renato Shin, MD    Family History Family History  Problem Relation Age of Onset  . Diabetes Father   . Kidney disease Father   . Arthritis Mother   . Heart disease Mother        CAD    Social History Social History   Tobacco Use  . Smoking status: Current Every Day Smoker    Packs/day: 0.25    Types: Cigarettes  . Smokeless tobacco: Never Used  . Tobacco comment: cutting back  Substance Use Topics  . Alcohol use: No    Alcohol/week: 0.0 oz    Comment: 08/14/14 none  . Drug use: Yes    Types: "Crack" cocaine, Marijuana, Heroin    Comment: s/p rehab x 6, occasional drug use (per pt last  use was 06/2015)     Allergies   Codeine   Review of Systems Review of Systems  All other systems reviewed and are negative.    Physical Exam Updated Vital Signs BP 120/65 (BP Location: Right Arm)   Pulse 77   Temp 98.2 F (36.8 C) (Oral)   Resp 15   Ht 6' (1.829 m)   Wt 72.6 kg (160 lb)   SpO2 95%   BMI 21.70 kg/m   Physical Exam  Constitutional: He appears well-developed. He appears distressed.  Disheveled, ill-appearing and uncomfortable appearing  HENT:  Head: Normocephalic and atraumatic.  Cardiovascular: Normal rate and regular rhythm.  No murmur  heard. Pulmonary/Chest: Effort normal and breath sounds normal. No respiratory distress.  Abdominal: Soft. There is no tenderness. There is no rebound and no guarding.  Musculoskeletal: He exhibits no edema or tenderness.  There is erythema to the right upper extremity with a small pustule in the right AC fossa.  Neurological: He is alert.  Confused.  Oriented to hospital but disoriented to recent events.  He does move all 4 extremities symmetrically.  Skin: Skin is warm and dry.  Psychiatric: He has a normal mood and affect. His behavior is normal.  Nursing note and vitals reviewed.    ED Treatments / Results  Labs (all labs ordered are listed, but only abnormal results are displayed) Labs Reviewed  CBC WITH DIFFERENTIAL/PLATELET - Abnormal; Notable for  the following components:      Result Value   WBC 19.4 (*)    Neutro Abs 16.6 (*)    Monocytes Absolute 1.4 (*)    All other components within normal limits  CBG MONITORING, ED - Abnormal; Notable for the following components:   Glucose-Capillary >600 (*)    All other components within normal limits  I-STAT CG4 LACTIC ACID, ED - Abnormal; Notable for the following components:   Lactic Acid, Venous 11.32 (*)    All other components within normal limits  CULTURE, BLOOD (ROUTINE X 2)  CULTURE, BLOOD (ROUTINE X 2)  URINE CULTURE  COMPREHENSIVE METABOLIC PANEL  URINALYSIS, ROUTINE W REFLEX MICROSCOPIC  BLOOD GAS, VENOUS  ETHANOL  I-STAT TROPONIN, ED    EKG  EKG Interpretation  Date/Time:  Friday April 18 2017 20:33:44 EST Ventricular Rate:  79 PR Interval:    QRS Duration: 93 QT Interval:  435 QTC Calculation: 499 R Axis:   -49 Text Interpretation:  Sinus rhythm RAE, consider biatrial enlargement LAD, consider left anterior fascicular block Nonspecific T abnormalities, lateral leads Borderline prolonged QT interval When compared with ECG of 04/19/2016, QT has lengthened Confirmed by Delora Fuel ([id number]) on 04/19/2017  12:03:29 AM       Radiology No results found.  Procedures Procedures (including critical care time) CRITICAL CARE Performed by: Quintella Reichert   Total critical care time: 40 minutes  Critical care time was exclusive of separately billable procedures and treating other patients.  Critical care was necessary to treat or prevent imminent or life-threatening deterioration.  Critical care was time spent personally by me on the following activities: development of treatment plan with patient and/or surrogate as well as nursing, discussions with consultants, evaluation of patient's response to treatment, examination of patient, obtaining history from patient or surrogate, ordering and performing treatments and interventions, ordering and review of laboratory studies, ordering and review of radiographic studies, pulse oximetry and re-evaluation of patient's condition.  Medications Ordered in ED Medications  piperacillin-tazobactam (ZOSYN) IVPB 3.375 g (not administered)  vancomycin (VANCOCIN) IVPB 1000 mg/200 mL premix (not administered)  sodium chloride 0.9 % bolus 2,178 mL (2,178 mLs Intravenous New Bag/Given 04/18/17 2104)     Initial Impression / Assessment and Plan / ED Course  I have reviewed the triage vital signs and the nursing notes.  Pertinent labs & imaging results that were available during my care of the patient were reviewed by me and considered in my medical decision making (see chart for details).     Patient presents to the emergency department with altered mental status, reports of hypotension prior to ED arrival for EMS.  Patient is very altered and encephalopathic on ED arrival with no focal neurologic deficits.  On initial evaluation he appeared to have erythema of his right arm.  With hypothermia and reports of prehospital hypotension as well as lactic acidosis he was started on IV antibiotics for possible sepsis.  After IV fluid administration his repeat lactate  completely cleared, question if lactic acidosis was secondary to another process such as possible prehospital seizure versus lab error.  Labs demonstrate acute kidney injury and patient did have urinary retention in the department.  Foley catheter was placed and greater than 700 mL's were returned immediately.  Hospitalist consulted for admission for acute encephalopathy with AKI and presumed sepsis.  Patient updated of findings of studies and recommendation for admission for further treatment and he is in agreement with plan.  Final Clinical Impressions(s) / ED Diagnoses  Final diagnoses:  None    ED Discharge Orders    None       Quintella Reichert, MD 04/19/17 1855    Quintella Reichert, MD 05/06/17 1101

## 2017-04-18 NOTE — Progress Notes (Signed)
RBV to Dr. Ralene Bathe with unreportable P02.  This is a venous gas with no panic values noted.

## 2017-04-18 NOTE — Progress Notes (Signed)
A consult was received from an ED physician for vancomycin per pharmacy dosing.  The patient's profile has been reviewed for ht/wt/allergies/indication/available labs.   A one time order has been placed for Vancomycin 1gm iv x1 .  Further antibiotics/pharmacy consults should be ordered by admitting physician if indicated.                       Thank you, Nani Skillern Crowford 04/18/2017  9:04 PM

## 2017-04-18 NOTE — ED Notes (Signed)
Bed: WA20 Expected date:  Expected time:  Means of arrival:  Comments: EMS 62 yo male hypotensive and hyperglycemia

## 2017-04-19 ENCOUNTER — Inpatient Hospital Stay (HOSPITAL_COMMUNITY): Payer: Medicare HMO

## 2017-04-19 ENCOUNTER — Other Ambulatory Visit: Payer: Self-pay

## 2017-04-19 ENCOUNTER — Encounter (HOSPITAL_COMMUNITY): Payer: Self-pay | Admitting: Family Medicine

## 2017-04-19 DIAGNOSIS — Z885 Allergy status to narcotic agent status: Secondary | ICD-10-CM | POA: Diagnosis not present

## 2017-04-19 DIAGNOSIS — H409 Unspecified glaucoma: Secondary | ICD-10-CM | POA: Diagnosis present

## 2017-04-19 DIAGNOSIS — E103599 Type 1 diabetes mellitus with proliferative diabetic retinopathy without macular edema, unspecified eye: Secondary | ICD-10-CM | POA: Diagnosis present

## 2017-04-19 DIAGNOSIS — E1022 Type 1 diabetes mellitus with diabetic chronic kidney disease: Secondary | ICD-10-CM | POA: Diagnosis present

## 2017-04-19 DIAGNOSIS — D72829 Elevated white blood cell count, unspecified: Secondary | ICD-10-CM | POA: Diagnosis present

## 2017-04-19 DIAGNOSIS — I959 Hypotension, unspecified: Secondary | ICD-10-CM | POA: Diagnosis present

## 2017-04-19 DIAGNOSIS — G9340 Encephalopathy, unspecified: Secondary | ICD-10-CM | POA: Diagnosis present

## 2017-04-19 DIAGNOSIS — L97409 Non-pressure chronic ulcer of unspecified heel and midfoot with unspecified severity: Secondary | ICD-10-CM | POA: Diagnosis present

## 2017-04-19 DIAGNOSIS — E872 Acidosis: Secondary | ICD-10-CM | POA: Diagnosis present

## 2017-04-19 DIAGNOSIS — N182 Chronic kidney disease, stage 2 (mild): Secondary | ICD-10-CM | POA: Diagnosis present

## 2017-04-19 DIAGNOSIS — Z89411 Acquired absence of right great toe: Secondary | ICD-10-CM | POA: Diagnosis not present

## 2017-04-19 DIAGNOSIS — L97412 Non-pressure chronic ulcer of right heel and midfoot with fat layer exposed: Secondary | ICD-10-CM | POA: Diagnosis not present

## 2017-04-19 DIAGNOSIS — E1051 Type 1 diabetes mellitus with diabetic peripheral angiopathy without gangrene: Secondary | ICD-10-CM | POA: Diagnosis present

## 2017-04-19 DIAGNOSIS — Z794 Long term (current) use of insulin: Secondary | ICD-10-CM | POA: Diagnosis not present

## 2017-04-19 DIAGNOSIS — E10621 Type 1 diabetes mellitus with foot ulcer: Secondary | ICD-10-CM | POA: Diagnosis present

## 2017-04-19 DIAGNOSIS — R296 Repeated falls: Secondary | ICD-10-CM | POA: Diagnosis present

## 2017-04-19 DIAGNOSIS — N179 Acute kidney failure, unspecified: Secondary | ICD-10-CM | POA: Diagnosis not present

## 2017-04-19 DIAGNOSIS — B182 Chronic viral hepatitis C: Secondary | ICD-10-CM | POA: Diagnosis present

## 2017-04-19 DIAGNOSIS — E871 Hypo-osmolality and hyponatremia: Secondary | ICD-10-CM | POA: Diagnosis present

## 2017-04-19 DIAGNOSIS — I10 Essential (primary) hypertension: Secondary | ICD-10-CM | POA: Diagnosis not present

## 2017-04-19 DIAGNOSIS — I129 Hypertensive chronic kidney disease with stage 1 through stage 4 chronic kidney disease, or unspecified chronic kidney disease: Secondary | ICD-10-CM | POA: Diagnosis present

## 2017-04-19 DIAGNOSIS — G92 Toxic encephalopathy: Secondary | ICD-10-CM | POA: Diagnosis present

## 2017-04-19 DIAGNOSIS — I739 Peripheral vascular disease, unspecified: Secondary | ICD-10-CM | POA: Diagnosis not present

## 2017-04-19 DIAGNOSIS — E861 Hypovolemia: Secondary | ICD-10-CM | POA: Diagnosis present

## 2017-04-19 DIAGNOSIS — E1069 Type 1 diabetes mellitus with other specified complication: Secondary | ICD-10-CM | POA: Diagnosis not present

## 2017-04-19 DIAGNOSIS — E1065 Type 1 diabetes mellitus with hyperglycemia: Secondary | ICD-10-CM | POA: Diagnosis present

## 2017-04-19 DIAGNOSIS — Z7982 Long term (current) use of aspirin: Secondary | ICD-10-CM | POA: Diagnosis not present

## 2017-04-19 DIAGNOSIS — W19XXXA Unspecified fall, initial encounter: Secondary | ICD-10-CM | POA: Diagnosis not present

## 2017-04-19 DIAGNOSIS — F1910 Other psychoactive substance abuse, uncomplicated: Secondary | ICD-10-CM | POA: Diagnosis present

## 2017-04-19 DIAGNOSIS — F17210 Nicotine dependence, cigarettes, uncomplicated: Secondary | ICD-10-CM | POA: Diagnosis present

## 2017-04-19 DIAGNOSIS — E1029 Type 1 diabetes mellitus with other diabetic kidney complication: Secondary | ICD-10-CM | POA: Diagnosis not present

## 2017-04-19 DIAGNOSIS — N170 Acute kidney failure with tubular necrosis: Secondary | ICD-10-CM | POA: Diagnosis present

## 2017-04-19 LAB — BASIC METABOLIC PANEL
ANION GAP: 4 — AB (ref 5–15)
Anion gap: 6 (ref 5–15)
Anion gap: 6 (ref 5–15)
Anion gap: 5 (ref 5–15)
BUN: 42 mg/dL — ABNORMAL HIGH (ref 6–20)
BUN: 40 mg/dL — ABNORMAL HIGH (ref 6–20)
BUN: 35 mg/dL — ABNORMAL HIGH (ref 6–20)
BUN: 27 mg/dL — ABNORMAL HIGH (ref 6–20)
CHLORIDE: 98 mmol/L — AB (ref 101–111)
CO2: 32 mmol/L (ref 22–32)
CO2: 33 mmol/L — ABNORMAL HIGH (ref 22–32)
CO2: 33 mmol/L — ABNORMAL HIGH (ref 22–32)
CO2: 26 mmol/L (ref 22–32)
Calcium: 7.7 mg/dL — ABNORMAL LOW (ref 8.9–10.3)
Calcium: 8 mg/dL — ABNORMAL LOW (ref 8.9–10.3)
Calcium: 7.6 mg/dL — ABNORMAL LOW (ref 8.9–10.3)
Calcium: 6.5 mg/dL — ABNORMAL LOW (ref 8.9–10.3)
Chloride: 101 mmol/L (ref 101–111)
Chloride: 99 mmol/L — ABNORMAL LOW (ref 101–111)
Chloride: 106 mmol/L (ref 101–111)
Creatinine, Ser: 1.94 mg/dL — ABNORMAL HIGH (ref 0.61–1.24)
Creatinine, Ser: 1.83 mg/dL — ABNORMAL HIGH (ref 0.61–1.24)
Creatinine, Ser: 1.69 mg/dL — ABNORMAL HIGH (ref 0.61–1.24)
Creatinine, Ser: 1.17 mg/dL (ref 0.61–1.24)
GFR calc Af Amer: 42 mL/min — ABNORMAL LOW (ref >60)
GFR calc Af Amer: 45 mL/min — ABNORMAL LOW (ref >60)
GFR calc Af Amer: 49 mL/min — ABNORMAL LOW (ref >60)
GFR calc Af Amer: >60 mL/min (ref >60)
GFR, EST NON AFRICAN AMERICAN: 36 mL/min — AB (ref >60)
GFR, EST NON AFRICAN AMERICAN: 38 mL/min — AB (ref >60)
GFR, EST NON AFRICAN AMERICAN: 42 mL/min — AB (ref >60)
GLUCOSE: 316 mg/dL — AB (ref 65–99)
GLUCOSE: 194 mg/dL — AB (ref 65–99)
Glucose, Bld: 93 mg/dL (ref 65–99)
Glucose, Bld: 149 mg/dL — ABNORMAL HIGH (ref 65–99)
POTASSIUM: 5.2 mmol/L — AB (ref 3.5–5.1)
POTASSIUM: 3.1 mmol/L — AB (ref 3.5–5.1)
POTASSIUM: 3.2 mmol/L — AB (ref 3.5–5.1)
POTASSIUM: 2.6 mmol/L — AB (ref 3.5–5.1)
SODIUM: 137 mmol/L (ref 135–145)
Sodium: 136 mmol/L (ref 135–145)
Sodium: 140 mmol/L (ref 135–145)
Sodium: 136 mmol/L (ref 135–145)

## 2017-04-19 LAB — RESPIRATORY PANEL BY PCR
ADENOVIRUS-RVPPCR: NOT DETECTED
BORDETELLA PERTUSSIS-RVPCR: NOT DETECTED
CHLAMYDOPHILA PNEUMONIAE-RVPPCR: NOT DETECTED
CORONAVIRUS NL63-RVPPCR: NOT DETECTED
Coronavirus 229E: NOT DETECTED
Coronavirus HKU1: NOT DETECTED
Coronavirus OC43: NOT DETECTED
INFLUENZA A-RVPPCR: NOT DETECTED
Influenza B: NOT DETECTED
Metapneumovirus: NOT DETECTED
Mycoplasma pneumoniae: NOT DETECTED
PARAINFLUENZA VIRUS 3-RVPPCR: NOT DETECTED
PARAINFLUENZA VIRUS 4-RVPPCR: NOT DETECTED
Parainfluenza Virus 1: NOT DETECTED
Parainfluenza Virus 2: NOT DETECTED
RESPIRATORY SYNCYTIAL VIRUS-RVPPCR: NOT DETECTED
RHINOVIRUS / ENTEROVIRUS - RVPPCR: NOT DETECTED

## 2017-04-19 LAB — CBC WITH DIFFERENTIAL/PLATELET
Basophils Absolute: 0.1 10*3/uL (ref 0.0–0.1)
Basophils Relative: 1 %
EOS PCT: 1 %
Eosinophils Absolute: 0.2 10*3/uL (ref 0.0–0.7)
HEMATOCRIT: 36.9 % — AB (ref 39.0–52.0)
Hemoglobin: 12.4 g/dL — ABNORMAL LOW (ref 13.0–17.0)
LYMPHS ABS: 2.4 10*3/uL (ref 0.7–4.0)
LYMPHS PCT: 13 %
MCH: 29.7 pg (ref 26.0–34.0)
MCHC: 33.6 g/dL (ref 30.0–36.0)
MCV: 88.3 fL (ref 78.0–100.0)
MONO ABS: 1.5 10*3/uL — AB (ref 0.1–1.0)
MONOS PCT: 8 %
NEUTROS ABS: 15.1 10*3/uL — AB (ref 1.7–7.7)
Neutrophils Relative %: 79 %
PLATELETS: 267 10*3/uL (ref 150–400)
RBC: 4.18 MIL/uL — ABNORMAL LOW (ref 4.22–5.81)
RDW: 13.6 % (ref 11.5–15.5)
WBC: 19.3 10*3/uL — ABNORMAL HIGH (ref 4.0–10.5)

## 2017-04-19 LAB — GLUCOSE, CAPILLARY
GLUCOSE-CAPILLARY: 95 mg/dL (ref 65–99)
Glucose-Capillary: 226 mg/dL — ABNORMAL HIGH (ref 65–99)

## 2017-04-19 LAB — RAPID URINE DRUG SCREEN, HOSP PERFORMED
Amphetamines: NOT DETECTED
BENZODIAZEPINES: POSITIVE — AB
Barbiturates: NOT DETECTED
COCAINE: POSITIVE — AB
OPIATES: POSITIVE — AB
Tetrahydrocannabinol: POSITIVE — AB

## 2017-04-19 LAB — MAGNESIUM: Magnesium: 1.6 mg/dL — ABNORMAL LOW (ref 1.7–2.4)

## 2017-04-19 LAB — CBG MONITORING, ED
GLUCOSE-CAPILLARY: 434 mg/dL — AB (ref 65–99)
GLUCOSE-CAPILLARY: 128 mg/dL — AB (ref 65–99)
GLUCOSE-CAPILLARY: 135 mg/dL — AB (ref 65–99)
GLUCOSE-CAPILLARY: 172 mg/dL — AB (ref 65–99)
Glucose-Capillary: 134 mg/dL — ABNORMAL HIGH (ref 65–99)
Glucose-Capillary: 369 mg/dL — ABNORMAL HIGH (ref 65–99)
Glucose-Capillary: 523 mg/dL (ref 65–99)
Glucose-Capillary: 70 mg/dL (ref 65–99)
Glucose-Capillary: 98 mg/dL (ref 65–99)

## 2017-04-19 LAB — LACTIC ACID, PLASMA: LACTIC ACID, VENOUS: 2.9 mmol/L — AB (ref 0.5–1.9)

## 2017-04-19 LAB — I-STAT CG4 LACTIC ACID, ED: Lactic Acid, Venous: 1.52 mmol/L (ref 0.5–1.9)

## 2017-04-19 MED ORDER — INSULIN ASPART 100 UNIT/ML ~~LOC~~ SOLN
0.0000 [IU] | Freq: Three times a day (TID) | SUBCUTANEOUS | Status: DC
  Administered 2017-04-19 – 2017-04-20 (×3): 3 [IU] via SUBCUTANEOUS
  Filled 2017-04-19: qty 1

## 2017-04-19 MED ORDER — ONDANSETRON HCL 4 MG/2ML IJ SOLN
4.0000 mg | Freq: Three times a day (TID) | INTRAMUSCULAR | Status: DC | PRN
  Administered 2017-04-19: 4 mg via INTRAVENOUS
  Filled 2017-04-19: qty 2

## 2017-04-19 MED ORDER — VITAMIN B-1 100 MG PO TABS
100.0000 mg | ORAL_TABLET | Freq: Every day | ORAL | Status: DC
  Administered 2017-04-19 – 2017-04-20 (×2): 100 mg via ORAL
  Filled 2017-04-19 (×2): qty 1

## 2017-04-19 MED ORDER — HEPARIN SODIUM (PORCINE) 5000 UNIT/ML IJ SOLN
5000.0000 [IU] | Freq: Three times a day (TID) | INTRAMUSCULAR | Status: DC
  Administered 2017-04-19 – 2017-04-20 (×7): 5000 [IU] via SUBCUTANEOUS
  Filled 2017-04-19 (×6): qty 1

## 2017-04-19 MED ORDER — POTASSIUM CHLORIDE 10 MEQ/100ML IV SOLN
10.0000 meq | INTRAVENOUS | Status: AC
  Administered 2017-04-19: 10 meq via INTRAVENOUS
  Filled 2017-04-19 (×2): qty 100

## 2017-04-19 MED ORDER — FAMOTIDINE 20 MG PO TABS
20.0000 mg | ORAL_TABLET | Freq: Two times a day (BID) | ORAL | Status: DC
Start: 2017-04-19 — End: 2017-04-21
  Administered 2017-04-19 – 2017-04-20 (×4): 20 mg via ORAL
  Filled 2017-04-19 (×4): qty 1

## 2017-04-19 MED ORDER — DEXTROSE 50 % IV SOLN: 1.0000 | Freq: Once | INTRAVENOUS | Status: DC

## 2017-04-19 MED ORDER — SODIUM CHLORIDE 0.9 % IV SOLN
INTRAVENOUS | Status: DC
  Administered 2017-04-19: 01:00:00 via INTRAVENOUS

## 2017-04-19 MED ORDER — DEXTROSE 5 % IV SOLN
2.0000 g | Freq: Two times a day (BID) | INTRAVENOUS | Status: DC
  Administered 2017-04-19 – 2017-04-20 (×4): 2 g via INTRAVENOUS
  Filled 2017-04-19 (×5): qty 2

## 2017-04-19 MED ORDER — LORAZEPAM 2 MG/ML IJ SOLN
2.0000 mg | INTRAMUSCULAR | Status: DC | PRN
  Administered 2017-04-19 – 2017-04-21 (×7): 2 mg via INTRAVENOUS
  Filled 2017-04-19 (×5): qty 1
  Filled 2017-04-19: qty 2
  Filled 2017-04-19 (×2): qty 1

## 2017-04-19 MED ORDER — INSULIN REGULAR HUMAN 100 UNIT/ML IJ SOLN: INTRAMUSCULAR | Status: DC

## 2017-04-19 MED ORDER — GI COCKTAIL ~~LOC~~
30.0000 mL | Freq: Three times a day (TID) | ORAL | Status: DC | PRN
  Administered 2017-04-19: 30 mL via ORAL
  Filled 2017-04-19: qty 30

## 2017-04-19 MED ORDER — SODIUM CHLORIDE 0.9 % IV SOLN: INTRAVENOUS | Status: DC

## 2017-04-19 MED ORDER — DEXTROSE-NACL 5-0.45 % IV SOLN
INTRAVENOUS | Status: DC
  Administered 2017-04-19: 04:00:00 via INTRAVENOUS

## 2017-04-19 MED ORDER — FOLIC ACID 1 MG PO TABS
1.0000 mg | ORAL_TABLET | Freq: Every day | ORAL | Status: DC
  Administered 2017-04-19 – 2017-04-20 (×2): 1 mg via ORAL
  Filled 2017-04-19 (×2): qty 1

## 2017-04-19 MED ORDER — POTASSIUM CHLORIDE 10 MEQ/100ML IV SOLN
10.0000 meq | INTRAVENOUS | Status: AC
  Administered 2017-04-19 (×4): 10 meq via INTRAVENOUS
  Filled 2017-04-19 (×4): qty 100

## 2017-04-19 MED ORDER — PREGABALIN 50 MG PO CAPS
50.0000 mg | ORAL_CAPSULE | Freq: Three times a day (TID) | ORAL | Status: DC
  Administered 2017-04-19 – 2017-04-20 (×5): 50 mg via ORAL
  Filled 2017-04-19 (×5): qty 1

## 2017-04-19 MED ORDER — ADULT MULTIVITAMIN W/MINERALS CH
1.0000 | ORAL_TABLET | Freq: Every day | ORAL | Status: DC
  Administered 2017-04-19 – 2017-04-20 (×2): 1 via ORAL
  Filled 2017-04-19 (×2): qty 1

## 2017-04-19 MED ORDER — VANCOMYCIN HCL IN DEXTROSE 750-5 MG/150ML-% IV SOLN
750.0000 mg | Freq: Two times a day (BID) | INTRAVENOUS | Status: DC
  Administered 2017-04-19 – 2017-04-20 (×4): 750 mg via INTRAVENOUS
  Filled 2017-04-19 (×5): qty 150

## 2017-04-19 MED ORDER — INSULIN GLARGINE 100 UNIT/ML ~~LOC~~ SOLN
30.0000 [IU] | Freq: Every day | SUBCUTANEOUS | Status: DC
  Administered 2017-04-19 – 2017-04-20 (×2): 30 [IU] via SUBCUTANEOUS
  Filled 2017-04-19 (×3): qty 0.3

## 2017-04-19 MED ORDER — ACETAMINOPHEN 500 MG PO TABS
1000.0000 mg | ORAL_TABLET | Freq: Four times a day (QID) | ORAL | Status: DC | PRN
  Administered 2017-04-19: 1000 mg via ORAL
  Filled 2017-04-19: qty 2

## 2017-04-19 MED ORDER — VANCOMYCIN HCL IN DEXTROSE 750-5 MG/150ML-% IV SOLN
750.0000 mg | INTRAVENOUS | Status: DC
  Filled 2017-04-19: qty 150

## 2017-04-19 MED ORDER — ASPIRIN EC 81 MG PO TBEC
81.0000 mg | DELAYED_RELEASE_TABLET | Freq: Every day | ORAL | Status: DC
  Administered 2017-04-19 – 2017-04-20 (×2): 81 mg via ORAL
  Filled 2017-04-19 (×2): qty 1

## 2017-04-19 MED ORDER — DEXTROSE 5 % IV SOLN
1.0000 g | Freq: Two times a day (BID) | INTRAVENOUS | Status: DC
  Administered 2017-04-19: 1 g via INTRAVENOUS
  Filled 2017-04-19 (×3): qty 1

## 2017-04-19 MED ORDER — SODIUM CHLORIDE 0.45 % IV SOLN
INTRAVENOUS | Status: DC
  Administered 2017-04-19 – 2017-04-20 (×3): via INTRAVENOUS

## 2017-04-19 NOTE — ED Notes (Signed)
Pts bed assignment has been changed from Stepdown to Telemetry.

## 2017-04-19 NOTE — ED Notes (Signed)
MD paged per patient request as having toe pain and indigestion.

## 2017-04-19 NOTE — ED Notes (Signed)
Spoke with Dr. Quincy Simmonds regarding frequency of blood glucose checks and BMP. MD to see patient this AM in ED.

## 2017-04-19 NOTE — Progress Notes (Signed)
Pharmacy Antibiotic Note  [name] is a 62 y.o. male admitted on 04/18/2017 with sepsis.  Pharmacy has been consulted for cefepime and vancomycin dosing.   Patient has already received Zosyn 3.375 grams IV x 1 and vancomycin 1 gram IV x 1 in ED.  AKI at baseline noted.  Plan: Cefepime 1 gram IV q12h, dosage adjusted for renal insufficiency Vancomycin 750 mg IV q24h, next dose 1800.  Goal AUC 400-500 Follow renal function, culture results, clinical course  Height: 6' (182.9 cm) Weight: 160 lb (72.6 kg) IBW/kg (Calculated) : 77.6  Temp (24hrs), Avg:97.8 F (36.6 C), Min:97.3 F (36.3 C), Max:98.2 F (36.8 C)  Recent Labs  Lab 04/18/17 2044 04/18/17 2049 04/19/17 0010  WBC 19.4*  --   --   CREATININE 2.42*  --   --   LATICACIDVEN  --  11.32* 1.52    Estimated Creatinine Clearance: 33.3 mL/min (A) (by C-G formula based on SCr of 2.42 mg/dL (H)).    Allergies  Allergen Reactions  . Codeine Itching    Antimicrobials this admission: 1/4 Zosyn x 1 1/4 Vancomycin>>  1/5 Cefepime >>  Dose adjustments this admission: n/a  Microbiology results: 1/4 BCx: collected 1/4 UCx: collected  Thank you for allowing pharmacy to be a part of this patient's care.  Clayburn Pert, PharmD, BCPS Pager: [phone] 04/19/2017  12:24 AM

## 2017-04-19 NOTE — ED Notes (Signed)
[place]

## 2017-04-19 NOTE — Progress Notes (Signed)
[facility]   Patient admitted after midnight see H&  Mr. Ruffolo is a 62 year old male with medical history of diabetes type 2, chronic hep C, polysubstance abuse and peripheral artery disease who presented to the emergency department with altered mental status, found to have severe hyperglycemia.  Patient was admitted for glucose management and further evaluation.  Continue plan per H&P with the following additions,  Transition insulin drip to subcutaneous Lantus Patient complaining of GERD will add Pepcid and GI cocktail Patient complaining of pain on his right foot where amputation was done, a Tylenol for pain we will try to avoid narcotics as UDA was positive for benzos, opiates, cocaine and cannabinoids.  Patient does not have prescription for any of these substances.  We will get an x-ray of the right foot. Continue IV fluids creatinine has improved. Leukocytosis -no real source of infection, but given initial presentation we will continue empiric antibiotics for now, blood cultures thus far negative.   Chipper Oman, MD

## 2017-04-19 NOTE — ED Notes (Signed)
Pt given 4oz. Of Orange Juice PO.

## 2017-04-19 NOTE — ED Notes (Signed)
MD at bedside; aware of K+.

## 2017-04-19 NOTE — Progress Notes (Signed)
Pharmacy Antibiotic Note  [name] is a 62 y.o. male admitted on 04/18/2017 with sepsis.  Pharmacy has been consulted for cefepime and vancomycin dosing.   Today, 04/19/2017:  AKI at baseline quickly resolved with hydration and HHS treatment.   WBC remain unchanged but no fevers  Plan: Increase cefepime to 2g IV q12 hr Increase vancomycin to 750 mg IV q12 hr Does have some MRSA risk factors, but BCx so far negative; consider stopping vancomycin if BCx remain clear at 48 hr and clinically stable   Height: 6' (182.9 cm) Weight: 160 lb (72.6 kg) IBW/kg (Calculated) : 77.6  Temp (24hrs), Avg:97.8 F (36.6 C), Min:97.3 F (36.3 C), Max:98.2 F (36.8 C)  Recent Labs  Lab 04/18/17 2044 04/18/17 2049 04/19/17 0010 04/19/17 0145 04/19/17 0147 04/19/17 0350 04/19/17 0807 04/19/17 1147 04/19/17 1317  WBC 19.4*  --   --   --   --   --   --   --  19.3*  CREATININE 2.42*  --   --  1.94*  --  1.83* 1.69* 1.17  --   LATICACIDVEN  --  11.32* 1.52  --  2.9*  --   --   --   --     Estimated Creatinine Clearance: 68.9 mL/min (by C-G formula based on SCr of 1.17 mg/dL).    Allergies  Allergen Reactions  . Codeine Itching    Antimicrobials this admission: 1/4 Zosyn x 1 1/4 Vancomycin>>  1/5 Cefepime >>  Dose adjustments this admission: 1/5: increase cefepime to 2g q12; increase vanc to 750 mg q12 with improved renal function  Microbiology results: 1/4 BCx: ngtd 1/4 UCx: sent 1/5 resp panel: neg  Thank you for allowing pharmacy to be a part of this patient's care.  Reuel Boom, PharmD, BCPS Pager: [phone] 04/19/2017, 1:55 PM

## 2017-04-19 NOTE — H&P (Addendum)
History and Physical    [name] [id number] DOB: Aug 18, 1955 DOA: 04/18/2017  PCP: [name], MD   Patient coming from: Home  Chief Complaint: Altered mental status, CBG "high," hypotension   HPI: [name] is a 62 y.o. male with medical history significant for polysubstance abuse including IV drug abuse, insulin-dependent diabetes mellitus, chronic hepatitis C status post treatment, peripheral arterial disease with with foot ulcer, and hypertension, now presenting to the emergency department for evaluation of altered mental status.  History is obtained through report of a roommate, ED personnel, and EMS.  Patient has reportedly been altered for several days, had a seizure in the setting of hypoglycemia several days ago, was treated for the hypoglycemia by EMS, but refused transport to the hospital.  Since that time, he has continued to be confused per the report of a roommate.  Patient reports feeling poorly, but is unable to provide much other history.  CBG reportedly read "high" with EMS and his blood pressure was 88/50.  He was given a liter of normal saline and transported to the hospital.  ED Course: Upon arrival to the ED, patient is found to be afebrile, saturating well on room air, and hypertensive to 180/80.  EKG features a sinus rhythm with left axis deviation and nonspecific T wave abnormalities in the lateral leads.  Chest x-ray is negative for acute cardiopulmonary disease.  Head CT is negative for acute intracranial abnormality.  Chemistry panel reveals a sodium of 131, glucose 666, BUN 47, and creatinine of 2.42, up from 1.1 in September.  CBC is notable for leukocytosis to 19,400.  Lactic acid is elevated to 11.32.  Troponin is normal and UA features glucosuria and proteinuria, but no ketones.  Patient was given 30 cc/kg normal saline bolus, blood and urine cultures were collected, he was started on insulin infusion, and treated with empiric vancomycin and Zosyn.  He remains  altered, but hemodynamically stable.  He will be admitted to the stepdown unit for ongoing evaluation and acute encephalopathy, suspected to be metabolic in the setting of HHS, but with sepsis still on the differential.  Review of Systems:  All other systems reviewed and apart from HPI, are negative.  Past Medical History:  Diagnosis Date  . DEPRESSION   . DIABETES MELLITUS, TYPE I   . DRUG ABUSE    pt should have NO controlled substances rx'ed  . Glaucoma   . HEPATITIS C    chronic  . Hypertension 02/18/2011  . 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 Vitiligo     Past Surgical History:  Procedure Laterality Date  . ABDOMINAL AORTOGRAM W/LOWER EXTREMITY N/A 07/25/2016   Procedure: Abdominal Aortogram w/Bilateral Lower Extremity Runoff;  Surgeon: Conrad Oak Grove, MD;  Location: [facility];  Service: Cardiovascular;  Laterality: N/A;  . ABDOMINAL AORTOGRAM W/LOWER EXTREMITY N/A 12/24/2016   Procedure: ABDOMINAL AORTOGRAM W/LOWER EXTREMITY;  Surgeon: Serafina Mitchell, MD;  Location: [place] [facility];  Service: Cardiovascular;  Laterality: N/A;  . AMPUTATION TOE Right 07/26/2016   Procedure: AMPUTATION GREAT TOE;  Surgeon: Serafina Mitchell, MD;  Location: [facility];  Service: Vascular;  Laterality: Right;  . EYE SURGERY     retinal surgery x 2, right eye  . INSERTION OF ILIAC STENT Right 07/26/2016   Procedure: INSERTION OF RIGHT POPLITEAL STENT WITH BALLOON ANGIOPLASTY;  Surgeon: Serafina Mitchell, MD;  Location: [facility];  Service: Vascular;  Laterality: Right;  . LOWER EXTREMITY ANGIOGRAM Right 07/26/2016  Procedure: LOWER EXTREMITY ANGIOGRAM;  Surgeon: Serafina Mitchell, MD;  Location: [place];  Service: Vascular;  Laterality: Right;  . PERIPHERAL VASCULAR BALLOON ANGIOPLASTY Right 07/26/2016   Procedure: PERIPHERAL VASCULAR BALLOON ANGIOPLASTY  RIGHT ANTERIOR TIBEAL ARTERY AND RIGHT SUPERFICIAL FEMORAL ARTERY;  Surgeon: Serafina Mitchell, MD;  Location: [place];  Service: Vascular;   Laterality: Right;  . PERIPHERAL VASCULAR INTERVENTION Right 12/24/2016   Procedure: PERIPHERAL VASCULAR INTERVENTION;  Surgeon: Serafina Mitchell, MD;  Location: [facility] [facility];  Service: Cardiovascular;  Laterality: Right;  lower extr     reports that he has been smoking cigarettes.  He has been smoking about 0.25 packs per day. he has never used smokeless tobacco. He reports that he uses drugs. Drugs: "Crack" cocaine, Marijuana, and Heroin. He reports that he does not drink alcohol.  Allergies  Allergen Reactions  . Codeine Itching    Family History  Problem Relation Age of Onset  . Diabetes Father   . Kidney disease Father   . Arthritis Mother   . Heart disease Mother        CAD     Prior to Admission medications   Medication Sig Start Date End Date Taking? Authorizing Provider  aspirin EC 81 MG EC tablet Take 1 tablet (81 mg total) by mouth daily. 07/30/16  Yes Lavina Hamman, MD  insulin NPH Human (NOVOLIN N) 100 UNIT/ML injection Inject 0.45 mLs (45 Units total) into the skin daily before breakfast. 01/07/17  Yes Renato Shin, MD    Physical Exam: Vitals:   04/18/17 2036 04/18/17 2205 04/18/17 2222 04/18/17 2357  BP: 120/65  (!) 184/84 (!) 119/38  Pulse: 77  85 82  Resp: 15  (!) 21 20  Temp:  (!) 97.3 F (36.3 C)    TempSrc:  Rectal    SpO2: 95%  100% 100%  Weight:      Height:          Constitutional: NAD, calm, in apparent discomfort Eyes: PERTLA, lids and conjunctivae normal ENMT: Mucous membranes are dry. Posterior pharynx clear of any exudate or lesions.   Neck: normal, supple, no masses, no thyromegaly Respiratory: clear to auscultation bilaterally, no wheezing, no crackles. Normal respiratory effort.   Cardiovascular: S1 & S2 heard, regular rate and rhythm. No extremity edema. No significant JVD. Abdomen: No distension, no tenderness, no masses palpated. Bowel sounds normal.  Musculoskeletal: no clubbing / cyanosis. Status-post left great toe  amputation.   Skin: no significant rashes, lesions, ulcers. Warm, dry, well-perfused. Neurologic: CN 2-12 grossly intact. Sensation intact. Strength 5/5 in all 4 limbs.  Psychiatric: Somnolent, easily roused. Oriented x 3.     Labs on Admission: I have personally reviewed following labs and imaging studies  CBC: Recent Labs  Lab 04/18/17 2044  WBC 19.4*  NEUTROABS 16.6*  HGB 14.8  HCT 43.7  MCV 87.6  PLT 010   Basic Metabolic Panel: Recent Labs  Lab 04/18/17 2044  NA 131*  K 3.5  CL 85*  CO2 26  GLUCOSE 666*  BUN 47*  CREATININE 2.42*  CALCIUM 8.7*   GFR: Estimated Creatinine Clearance: 33.3 mL/min (A) (by C-G formula based on SCr of 2.42 mg/dL (H)). Liver Function Tests: Recent Labs  Lab 04/18/17 2044  AST 33  ALT 17  ALKPHOS 78  BILITOT 1.2  PROT 6.5  ALBUMIN 3.0*   No results for input(s): LIPASE, AMYLASE in the last 168 hours. No results for input(s): AMMONIA in the last 168  hours. Coagulation Profile: No results for input(s): INR, PROTIME in the last 168 hours. Cardiac Enzymes: No results for input(s): CKTOTAL, CKMB, CKMBINDEX, TROPONINI in the last 168 hours. BNP (last 3 results) No results for input(s): PROBNP in the last 8760 hours. HbA1C: No results for input(s): HGBA1C in the last 72 hours. CBG: Recent Labs  Lab 04/18/17 2023 04/18/17 2301  GLUCAP >600* >600*   Lipid Profile: No results for input(s): CHOL, HDL, LDLCALC, TRIG, CHOLHDL, LDLDIRECT in the last 72 hours. Thyroid Function Tests: No results for input(s): TSH, T4TOTAL, FREET4, T3FREE, THYROIDAB in the last 72 hours. Anemia Panel: No results for input(s): VITAMINB12, FOLATE, FERRITIN, TIBC, IRON, RETICCTPCT in the last 72 hours. Urine analysis:    Component Value Date/Time   COLORURINE YELLOW 04/18/2017 2056   APPEARANCEUR CLEAR 04/18/2017 2056   LABSPEC 1.026 04/18/2017 2056   PHURINE 6.0 04/18/2017 2056   GLUCOSEU >=500 (A) 04/18/2017 2056   GLUCOSEU 500 04/28/2012 1149     HGBUR NEGATIVE 04/18/2017 2056   BILIRUBINUR NEGATIVE 04/18/2017 2056   BILIRUBINUR neg 04/28/2012 1053   KETONESUR NEGATIVE 04/18/2017 2056   PROTEINUR >=300 (A) 04/18/2017 2056   UROBILINOGEN 4.0 (H) 11/16/2013 1646   NITRITE NEGATIVE 04/18/2017 2056   LEUKOCYTESUR NEGATIVE 04/18/2017 2056   Sepsis Labs: @LABRCNTIP (procalcitonin:4,lacticidven:4) )No results found for this or any previous visit (from the past 240 hour(s)).   Radiological Exams on Admission: Ct Head Wo Contrast  Result Date: 04/18/2017 CLINICAL DATA:  Altered level of consciousness. Hypotension. Hyperglycemia. EXAM: CT HEAD WITHOUT CONTRAST TECHNIQUE: Contiguous axial images were obtained from the base of the skull through the vertex without intravenous contrast. COMPARISON:  03/03/2010 FINDINGS: Brain: Mild cerebral atrophy. No ventricular dilatation. Patchy low-attenuation changes in the deep white matter consistent with small vessel ischemia. No mass effect or midline shift. No abnormal extra-axial fluid collections. Gray-white matter junctions are distinct. Basal cisterns are not effaced. No acute intracranial hemorrhage. Vascular: Internal carotid artery and vertebrobasilar vascular calcifications. Skull: Calvarium appears intact. No depressed skull fractures. Subcentimeter calcification along the inner table of the right frontal region probably represents a small calcified meningioma. No change since prior study. Sinuses/Orbits: Paranasal sinuses and mastoid air cells are not opacified. Old appearing displaced nasal bone fractures. Other: Congenital nonunion of the posterior arch of C1. IMPRESSION: No acute intracranial abnormalities. Mild chronic atrophy and small vessel ischemic changes. Old appearing nasal bone fractures. Electronically Signed   By: Lucienne Capers M.D.   On: 04/18/2017 21:44   Dg Chest Port 1 View  Result Date: 04/18/2017 CLINICAL DATA:  Altered mental status EXAM: PORTABLE CHEST 1 VIEW COMPARISON:   04/18/2016 FINDINGS: Normal heart and mediastinal contours. Clear lungs. No acute osseous abnormality. IMPRESSION: No active disease. Electronically Signed   By: Ashley Royalty M.D.   On: 04/18/2017 21:27    EKG: Independently reviewed. Sinus rhythm, LAD, T-wave flattening and inversions laterally.   Assessment/Plan  1. Acute encephalopathy  - Presents with 4 days of confusion and lethargy  - Head CT negative for acute intracranial abnormality and no focal neurologic deficits identified on exam  - Likely toxic-metabolic in setting of HHS; sepsis not excluded though no meningismus; drug abuse could be contributing and UDS ordered  - Plan to treat HHS as below, continue supportive care - If fails to resolve with treatment of HHS and electrolyte abnormalities, may need to expand the workup   2. HHS; insulin-dependent DM  - A1c was 7.4% in September 2018  - Pt follows with  endocrinology and is managed with NPH 45 units qAM at home  - Serum glucose is 666 on admission with AMS, no urine ketones or acidosis  - Given 2 liters NS in ED and started on insulin infusion  - Continue insulin infusion with frequent CBG's and serial chem panels    3. AKI  - SCr is 2.42 on admission, up from 1.10 in September 2018  - Likely a prerenal azotemia in setting of HHS with hypovolemia; ATN possible  - He has been fluid-resuscitated in ED and continued on IVF hydration  - Renally-dose medications, following serial chem panels as above    4. Hyponatremia  - Serum sodium is 131 on admission  - Secondary to marked hyperglycemia and hypovolemia - Anticipate resolution with fluid-resuscitation and insulin  - Following serial chem panels   5. Polysubstance abuse  - Hx of alcohol and polysubstance abuse, including IVDA - Check UDS, monitor with CIWA and prn Ativan    6. Leukocytosis; elevated lactate - WBC 19,400 on admission with lactate 11.32  - No fever or apparent focus of infection  - Likely secondary to  HHS with hypotension on EMS arrival  - Cultures were collected in ED and he was started on empiric abx  - Given the critical illness, will plan to continue abx initially while following cultures and clinical course    DVT prophylaxis: sq heparin  Code Status: Full  Family Communication: Discussed with patient Disposition Plan: Admit to SDU Consults called: None Admission status: Inpatient    Vianne Bulls, MD [name] Pager [phone]  If 7PM-7AM, please contact night-coverage [email or web address] Password [email or web address]  04/19/2017, 12:07 AM

## 2017-04-20 DIAGNOSIS — E1065 Type 1 diabetes mellitus with hyperglycemia: Secondary | ICD-10-CM

## 2017-04-20 DIAGNOSIS — G9340 Encephalopathy, unspecified: Secondary | ICD-10-CM

## 2017-04-20 DIAGNOSIS — I739 Peripheral vascular disease, unspecified: Secondary | ICD-10-CM

## 2017-04-20 DIAGNOSIS — E871 Hypo-osmolality and hyponatremia: Secondary | ICD-10-CM

## 2017-04-20 DIAGNOSIS — E1029 Type 1 diabetes mellitus with other diabetic kidney complication: Secondary | ICD-10-CM

## 2017-04-20 DIAGNOSIS — N179 Acute kidney failure, unspecified: Secondary | ICD-10-CM

## 2017-04-20 DIAGNOSIS — E1069 Type 1 diabetes mellitus with other specified complication: Secondary | ICD-10-CM

## 2017-04-20 DIAGNOSIS — W19XXXA Unspecified fall, initial encounter: Secondary | ICD-10-CM

## 2017-04-20 DIAGNOSIS — B182 Chronic viral hepatitis C: Secondary | ICD-10-CM

## 2017-04-20 DIAGNOSIS — F1910 Other psychoactive substance abuse, uncomplicated: Secondary | ICD-10-CM

## 2017-04-20 LAB — CBC WITH DIFFERENTIAL/PLATELET
Basophils Absolute: 0.1 10*3/uL (ref 0.0–0.1)
Basophils Relative: 1 %
EOS ABS: 0.3 10*3/uL (ref 0.0–0.7)
Eosinophils Relative: 2 %
HCT: 36.9 % — ABNORMAL LOW (ref 39.0–52.0)
Hemoglobin: 12.3 g/dL — ABNORMAL LOW (ref 13.0–17.0)
LYMPHS ABS: 2.7 10*3/uL (ref 0.7–4.0)
LYMPHS PCT: 19 %
MCH: 29.4 pg (ref 26.0–34.0)
MCHC: 33.3 g/dL (ref 30.0–36.0)
MCV: 88.3 fL (ref 78.0–100.0)
MONOS PCT: 8 %
Monocytes Absolute: 1.1 10*3/uL — ABNORMAL HIGH (ref 0.1–1.0)
Neutro Abs: 10.1 10*3/uL — ABNORMAL HIGH (ref 1.7–7.7)
Neutrophils Relative %: 70 %
Platelets: 261 10*3/uL (ref 150–400)
RBC: 4.18 MIL/uL — AB (ref 4.22–5.81)
RDW: 13.3 % (ref 11.5–15.5)
WBC: 14.3 10*3/uL — ABNORMAL HIGH (ref 4.0–10.5)

## 2017-04-20 LAB — BASIC METABOLIC PANEL
Anion gap: 4 — ABNORMAL LOW (ref 5–15)
BUN: 23 mg/dL — AB (ref 6–20)
CALCIUM: 8.2 mg/dL — AB (ref 8.9–10.3)
CO2: 27 mmol/L (ref 22–32)
Chloride: 100 mmol/L — ABNORMAL LOW (ref 101–111)
Creatinine, Ser: 1.33 mg/dL — ABNORMAL HIGH (ref 0.61–1.24)
GFR calc Af Amer: >60 mL/min (ref >60)
GFR, EST NON AFRICAN AMERICAN: 57 mL/min — AB (ref >60)
GLUCOSE: 196 mg/dL — AB (ref 65–99)
Potassium: 4 mmol/L (ref 3.5–5.1)
Sodium: 131 mmol/L — ABNORMAL LOW (ref 135–145)

## 2017-04-20 LAB — URINE CULTURE: CULTURE: NO GROWTH

## 2017-04-20 LAB — GLUCOSE, CAPILLARY
GLUCOSE-CAPILLARY: 199 mg/dL — AB (ref 65–99)
GLUCOSE-CAPILLARY: 108 mg/dL — AB (ref 65–99)
GLUCOSE-CAPILLARY: 207 mg/dL — AB (ref 65–99)
Glucose-Capillary: 178 mg/dL — ABNORMAL HIGH (ref 65–99)

## 2017-04-20 LAB — HEMOGLOBIN A1C
Hgb A1c MFr Bld: 8.4 % — ABNORMAL HIGH (ref 4.8–5.6)
Mean Plasma Glucose: 194.38 mg/dL

## 2017-04-20 MED ORDER — NICOTINE 21 MG/24HR TD PT24
21.0000 mg | MEDICATED_PATCH | Freq: Every day | TRANSDERMAL | Status: DC
  Administered 2017-04-20: 21 mg via TRANSDERMAL
  Filled 2017-04-20: qty 1

## 2017-04-20 MED ORDER — OXYCODONE HCL 5 MG PO TABS
5.0000 mg | ORAL_TABLET | Freq: Four times a day (QID) | ORAL | Status: DC | PRN
  Administered 2017-04-20: 5 mg via ORAL
  Filled 2017-04-20: qty 1

## 2017-04-20 NOTE — Progress Notes (Signed)
States no need to void "yet"

## 2017-04-20 NOTE — Progress Notes (Addendum)
PROGRESS NOTE  [name] JKK:[id number] DOB: 28-Jul-1955 DOA: 04/18/2017 PCP: [name], MD  HPI/Recap of past 24 hours: [name] is a 62 y.o. male with PMH significant for polysubstance abuse including IV drug abuse, insulin-dependent diabetes mellitus, chronic hepatitis C status post treatment, peripheral arterial disease with foot ulcer post right big toe amputation, hypertension, who presents to the emergency department for evaluation of altered mental status. ED [facility] Head CT negative for acute intracranial abnormality. Chemistry panel reveals glucose 666, BUN 47, and creatinine of 2.42, up from 1.1 in September.  CBC is notable for leukocytosis to 19,400.  Lactic acid is elevated to 11.32. Hypovolemic. Patient given 30 cc/kg normal saline bolus, blood and urine cultures collected; started on insulin infusion, and treated with empiric IV vancomycin and cefepime. Admitted to the stepdown unit for acute encephalopathy secondary to HHS vs others. CXR no active disease. R foot xray no acute abnormalities.  No acute events reported overnight. This morning patient is alert and oriented x 3 and inquires about going outside to smoke a cigarette. Nicotine patch ordered. Educated on leaving against medical advice and risks associated with it. Afebrile with tmax 99.7 and improving leukocytosis. Denies abd pain. Admits to poor appetite.    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   PAD (peripheral artery disease) ([name]   Acute kidney injury ([place])   Hyponatremia   Chronic ulcer of heel, right, with fat layer exposed ([place])   Type 1 diabetes mellitus with hyperosmolarity without nonketotic hyperglycemic hyperosmolar coma ([place])   Essential hypertension   IV drug abuse ([place])  Acute encephalopathy, resolved  - Presents with 4 days of confusion and lethargy  -  Head CT negative for acute intracranial abnormality and no focal neurologic deficits identified on exam  - Likely toxic-metabolic in setting of HHS; sepsis not excluded though no meningismus; drug abuse could be contributing and UDS ordered  - Plan to treat HHS as below, continue supportive care - If fails to resolve with treatment of HHS and electrolyte abnormalities, may need to expand the workup   HHS; insulin-dependent DM, improving  - A1c was 7.4% in September 2018  - Pt follows with endocrinology and is managed with NPH 45 units qAM at home  - Serum glucose is 666 on admission with AMS, no urine ketones or acidosis  - Given 2 liters NS in ED and started on insulin infusion  - Continue insulin infusion with frequent CBG's and serial chem panels    AKI on CKD 2 - SCr is 2.42 on admission, up from 1.10 in September 2018  - Likely a prerenal azotemia in setting of HHS with hypovolemia; ATN possible  - He has been fluid-resuscitated in ED and continued on IVF hydration  - Renally-dose medications, following serial chem panels as above   - cr 1.33 from 1.17 - Bmp am  Leukocytosis/lactic acidosis - WBC 19,400 on admission with lactate 11.32  - improving wbc 14 -No fever or apparent focus of infection  - Likely secondary to HHS with hypotension on EMS arrival  - Cultures were collected in ED and he was started on empiric abx  - continue abx initially while following cultures and clinical course  - blood cx x 2 no growth 2 days - repeat lactic acid, cbc am  Pseudo hyponatremia  - Serum sodium is 131 on admission  CBS 666 - Secondary to marked hyperglycemia and hypovolemia - BMP am  Polysubstance abuse  - Hx of alcohol and polysubstance abuse, including IVDA - Check UDS, monitor with CIWA and prn Ativan      Code Status: full   Family Communication: no family members at bedside  Disposition Plan: will stay another midnight to continue IV antibiotics; monitor blood  cultures and repeat lactic acid    Consultants:  none  Procedures:  none  Antimicrobials:  IV vanc and IV cefepime day #1  DVT prophylaxis:  sq heparin 5000 u tid   Objective: Vitals:   04/19/17 1500 04/19/17 2118 04/20/17 0054 04/20/17 0546  BP: (!) 172/76 (!) 191/92 (!) 146/65 (!) 160/98  Pulse: 77 81 75 77  Resp: 18 18 18 20   Temp: 98.2 F (36.8 C) 99.7 F (37.6 C) 98.9 F (37.2 C) 98.3 F (36.8 C)  TempSrc: Oral Oral Axillary Oral  SpO2: 100% 97% 96% 97%  Weight: 68.6 kg (151 lb 3.8 oz)     Height: 6' (1.829 m)       Intake/Output Summary (Last 24 hours) at 04/20/2017 0827 Last data filed at 04/20/2017 0500 Gross per 24 hour  Intake 1916.25 ml  Output 1625 ml  Net 291.25 ml   Filed Weights   04/18/17 2027 04/19/17 1500  Weight: 72.6 kg (160 lb) 68.6 kg (151 lb 3.8 oz)    Exam:   General:  62 yo CM  WD WN NAD A&O x 3   Cardiovascular: RRR no rubs or gallops  Respiratory: CTA no wheezes or rales   Abdomen: soft NT ND NBS x4   Musculoskeletal: non focal moves all no LE edema. Right big toe amputation . No drainage.  Skin: skin discoloration throughout  Psychiatry: Mood is anxious   Data Reviewed: CBC: Recent Labs  Lab 04/18/17 2044 04/19/17 1317 04/20/17 0527  WBC 19.4* 19.3* 14.3*  NEUTROABS 16.6* 15.1* 10.1*  HGB 14.8 12.4* 12.3*  HCT 43.7 36.9* 36.9*  MCV 87.6 88.3 88.3  PLT 326 267 740   Basic Metabolic Panel: Recent Labs  Lab 04/19/17 0145 04/19/17 0350 04/19/17 0807 04/19/17 1147 04/20/17 0527  NA 136 140 137 136 131*  K 5.2* 3.1* 3.2* 2.6* 4.0  CL 98* 101 99* 106 100*  CO2 32 33* 33* 26 27  GLUCOSE 316* 93 149* 194* 196*  BUN 42* 40* 35* 27* 23*  CREATININE 1.94* 1.83* 1.69* 1.17 1.33*  CALCIUM 7.7* 8.0* 7.6* 6.5* 8.2*  MG  --   --  1.6*  --   --    GFR: Estimated Creatinine Clearance: 57.3 mL/min (A) (by C-G formula based on SCr of 1.33 mg/dL (H)). Liver Function Tests: Recent Labs  Lab 04/18/17 2044  AST 33    ALT 17  ALKPHOS 78  BILITOT 1.2  PROT 6.5  ALBUMIN 3.0*   No results for input(s): LIPASE, AMYLASE in the last 168 hours. No results for input(s): AMMONIA in the last 168 hours. Coagulation Profile: No results for input(s): INR, PROTIME in the last 168 hours. Cardiac Enzymes: No results for input(s): CKTOTAL, CKMB, CKMBINDEX, TROPONINI in the last 168 hours. BNP (last 3 results) No results for input(s): PROBNP in the last 8760 hours. HbA1C: No results for input(s): HGBA1C in the last 72 hours. CBG: Recent Labs  Lab 04/19/17 0820 04/19/17 1150 04/19/17 1606 04/19/17 2127 04/20/17 0715  GLUCAP 134* 172* 95 226* 178*   Lipid Profile: No results for input(s): CHOL, HDL, LDLCALC, TRIG,  CHOLHDL, LDLDIRECT in the last 72 hours. Thyroid Function Tests: No results for input(s): TSH, T4TOTAL, FREET4, T3FREE, THYROIDAB in the last 72 hours. Anemia Panel: No results for input(s): VITAMINB12, FOLATE, FERRITIN, TIBC, IRON, RETICCTPCT in the last 72 hours. Urine analysis:    Component Value Date/Time   COLORURINE YELLOW 04/18/2017 2056   APPEARANCEUR CLEAR 04/18/2017 2056   LABSPEC 1.026 04/18/2017 2056   PHURINE 6.0 04/18/2017 2056   GLUCOSEU >=500 (A) 04/18/2017 2056   GLUCOSEU 500 04/28/2012 1149   HGBUR NEGATIVE 04/18/2017 2056   BILIRUBINUR NEGATIVE 04/18/2017 2056   BILIRUBINUR neg 04/28/2012 1053   KETONESUR NEGATIVE 04/18/2017 2056   PROTEINUR >=300 (A) 04/18/2017 2056   UROBILINOGEN 4.0 (H) 11/16/2013 1646   NITRITE NEGATIVE 04/18/2017 2056   LEUKOCYTESUR NEGATIVE 04/18/2017 2056   Sepsis Labs: @LABRCNTIP (procalcitonin:4,lacticidven:4)  ) Recent Results (from the past 240 hour(s))  Blood Culture (routine x 2)     Status: None (Preliminary result)   Collection Time: 04/18/17  9:24 PM  Result Value Ref Range Status   Specimen Description BLOOD RIGHT ANTECUBITAL  Final   Special Requests   Final    BOTTLES DRAWN AEROBIC AND ANAEROBIC Blood Culture adequate volume    Culture   Final    NO GROWTH < 12 HOURS Performed at [facility], [place] [address] [place], [place] [address]    Report Status PENDING  Incomplete  Blood Culture (routine x 2)     Status: None (Preliminary result)   Collection Time: 04/18/17  9:25 PM  Result Value Ref Range Status   Specimen Description BLOOD LEFT HAND  Final   Special Requests   Final    BOTTLES DRAWN AEROBIC AND ANAEROBIC Blood Culture adequate volume   Culture   Final    NO GROWTH < 12 HOURS Performed at [facility], [place] [address], [place], [place] [address]    Report Status PENDING  Incomplete  Respiratory Panel by PCR     Status: None   Collection Time: 04/19/17 12:21 AM  Result Value Ref Range Status   Adenovirus NOT DETECTED NOT DETECTED Final   Coronavirus 229E NOT DETECTED NOT DETECTED Final   Coronavirus HKU1 NOT DETECTED NOT DETECTED Final   Coronavirus NL63 NOT DETECTED NOT DETECTED Final   Coronavirus OC43 NOT DETECTED NOT DETECTED Final   Metapneumovirus NOT DETECTED NOT DETECTED Final   Rhinovirus / Enterovirus NOT DETECTED NOT DETECTED Final   Influenza A NOT DETECTED NOT DETECTED Final   Influenza B NOT DETECTED NOT DETECTED Final   Parainfluenza Virus 1 NOT DETECTED NOT DETECTED Final   Parainfluenza Virus 2 NOT DETECTED NOT DETECTED Final   Parainfluenza Virus 3 NOT DETECTED NOT DETECTED Final   Parainfluenza Virus 4 NOT DETECTED NOT DETECTED Final   Respiratory Syncytial Virus NOT DETECTED NOT DETECTED Final   Bordetella pertussis NOT DETECTED NOT DETECTED Final   Chlamydophila pneumoniae NOT DETECTED NOT DETECTED Final   Mycoplasma pneumoniae NOT DETECTED NOT DETECTED Final    Comment: Performed at [facility], [place] [address], [place],  [address]      Studies: Dg Foot Complete Right  Result Date: 04/19/2017 CLINICAL DATA:  Pain and redness across the toes of the right foot. EXAM: RIGHT FOOT COMPLETE - 3+ VIEW COMPARISON:  07/27/2016 FINDINGS: Status post  great toe amputation with some removal of the great toe metatarsal head. Although surgical borders of the bony osteotomy are somewhat irregular, there is no evidence for overt  destruction to suggest infection. No bony destruction to suggest osteomyelitis elsewhere in the right foot. No evidence of fracture. No subluxation or dislocation. Extensive vascular calcification is again evident. IMPRESSION: Status post great toe amputation. No evidence for acute bony abnormality on the current exam. Electronically Signed   By: Misty Stanley M.D.   On: 04/19/2017 15:31    Scheduled Meds: . aspirin EC  81 mg Oral Daily  . dextrose  1 ampule Intravenous Once  . famotidine  20 mg Oral BID  . folic acid  1 mg Oral Daily  . heparin  5,000 Units Subcutaneous Q8H  . insulin aspart  0-15 Units Subcutaneous TID WC  . insulin glargine  30 Units Subcutaneous Daily  . multivitamin with minerals  1 tablet Oral Daily  . pregabalin  50 mg Oral TID  . thiamine  100 mg Oral Daily    Continuous Infusions: . sodium chloride 75 mL/hr at 04/20/17 0528  . ceFEPime (MAXIPIME) IV Stopped (04/20/17 0127)  . insulin (NOVOLIN-R) infusion Stopped (04/19/17 0630)  . vancomycin Stopped (04/20/17 0048)     LOS: 1 day     Kayleen Memos, MD [name] Pager [phone]  If 7PM-7AM, please contact night-coverage [email or web address] Password [facility] 04/20/2017, 8:27 AM

## 2017-04-20 NOTE — Progress Notes (Signed)
The patient c/o 9/10 pain in legs. PCP was notified.

## 2017-04-20 NOTE — Plan of Care (Signed)
  Nutrition: Adequate nutrition will be maintained 04/20/2017 0233 - Progressing by Ashley Murrain, RN   Safety: Ability to remain free from injury will improve 04/20/2017 0233 - Progressing by Ashley Murrain, RN

## 2017-04-21 ENCOUNTER — Ambulatory Visit: Payer: Medicare HMO | Admitting: Endocrinology

## 2017-04-21 DIAGNOSIS — L97412 Non-pressure chronic ulcer of right heel and midfoot with fat layer exposed: Secondary | ICD-10-CM

## 2017-04-21 DIAGNOSIS — I10 Essential (primary) hypertension: Secondary | ICD-10-CM

## 2017-04-21 LAB — BLOOD GAS, VENOUS
ACID-BASE EXCESS: 6.7 mmol/L — AB (ref 0.0–2.0)
Bicarbonate: 30.9 mmol/L — ABNORMAL HIGH (ref 20.0–28.0)
DRAWN BY: 51425
O2 SAT: 36 %
PATIENT TEMPERATURE: 98.6
pCO2, Ven: 43.7 mmHg — ABNORMAL LOW (ref 44.0–60.0)
pH, Ven: 7.464 — ABNORMAL HIGH (ref 7.250–7.430)

## 2017-04-21 LAB — GLUCOSE, CAPILLARY: Glucose-Capillary: 88 mg/dL (ref 65–99)

## 2017-04-21 NOTE — Progress Notes (Signed)
Patient needed intermittent treatment for CIWA.Either Ativan 2mg  or 3mg   Will continue to monitor the patient.

## 2017-04-21 NOTE — Progress Notes (Signed)
  The patient was being inappropriate to the nurses especially the younger nurses,trying to touch them and saying inappropriate things. The  patient was asked not to touch/talk inappropriately to the staff. The patient got angry and wanted to get dressed and leave the hospital to have a cigarette. The staff (RN and tech) told the patient that there was no smoking in the hospital. The patient got out of bed and reached for his patient belonging bag to get his clothes on as he still was insistent that he was going to smoke outside. RN reminded the patient that he would be going against medical advice when he left the hospital.  The patient was half getting dressed and half walking around the room. The patient was reminded to sit down and get dressed as he was unsteady at times. The patient ripped off his telemetry and a PIV on his left arm. Security came to the room and the patient was waving around  his bloody arm (because he had pulled out the IV). Security and other staff told the patient to keep his arm still and not have blood all over the room. Then the patient started yelling about getting his $302. This cash was in the security lock-up. The A/C asked the patient if he was going to stay or leave the hospital. The patient stated that he was leaving the hospital and stated "I want my money".  The patient was dressed by this time. The remaining 2 x PIVs were removed. The patient did not want to sign the AMA form, so the A/C and staff nurse signed the form as witnesses. The patient was escorted to ED and was handed his $302.  The PCP on call was notified.

## 2017-04-23 LAB — CULTURE, BLOOD (ROUTINE X 2)
CULTURE: NO GROWTH
Culture: NO GROWTH
SPECIAL REQUESTS: ADEQUATE
Special Requests: ADEQUATE

## 2017-04-28 ENCOUNTER — Other Ambulatory Visit: Payer: Self-pay

## 2017-04-28 NOTE — Discharge Summary (Addendum)
Discharge Summary  [name] YOV:[id number] DOB: [birth year]  PCP: [name] Rail, MD  Admit date: 04/18/2017 Discharge date: 04/28/2017  Time spent: 25 minutes   Recommendations for Outpatient Follow-up:  1. Patient left against medical advice in the early morning of 04/21/17 after being inappropriate with the staff.  Discharge Diagnoses:  Active Hospital Problems   Diagnosis Date Noted  . Acute encephalopathy 04/19/2017  . IV drug abuse ([place]) 04/19/2017  . Essential hypertension   . Type 1 diabetes mellitus with hyperosmolarity without nonketotic hyperglycemic hyperosmolar coma ([place])   . Chronic ulcer of heel, right, with fat layer exposed ([place]) 05/20/2016  . Hyponatremia 04/18/2016  . Acute kidney injury ([place]) 04/18/2016  . PAD (peripheral artery disease) ([place]) 04/29/2013  . 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 Chronic hepatitis C ([place]) 11/22/2008    Resolved Hospital Problems  No resolved problems to display.    Vitals:   04/20/17 2118 04/21/17 0000  BP: (!) 164/78 (!) 158/81  Pulse: 76   Resp: 16   Temp: 98.3 F (36.8 C)   SpO2: 100%     History of present illness:  [name] a 62 y.o.malewith PMH significant forpolysubstance abuse including IV drug abuse, insulin-dependent diabetes mellitus, chronic hepatitis C status post treatment, peripheral arterial disease with foot ulcer post right big toe amputation, hypertension, who presents to the emergency department for evaluation of altered mental status. ED [facility] CT negative for acute intracranial abnormality. Chemistry panel reveals glucose 666, BUN 47, and creatinine of 2.42, up from 1.1 in September. CBC is notable for leukocytosis to 19,400. Lactic acid is elevated to 11.32. Hypovolemic. Patient given 30 cc/kgnormal saline bolus, blood and urine cultures collected; started on insulin infusion, and treated with empiric IV vancomycin and cefepime. Admitted to  the stepdown unit for acute encephalopathy secondary to HHS vs others. CXR no active disease. R foot xray no acute abnormalities.  When last seen by provider, the patient was alert and oriented x 3 and inquired about going outside to smoke a cigarette. Nicotine patch was ordered. Educated on leaving against medical advice and risks associated with it. Afebrile with tmax 99.7 and improving leukocytosis. Poor appetite.   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 ([name]   PAD (peripheral artery disease) ([name]   Acute kidney injury ([name])   Hyponatremia   Chronic ulcer of heel, right, with fat layer exposed ([name])   Type 1 diabetes mellitus with hyperosmolarity without nonketotic hyperglycemic hyperosmolar coma ([name])   Essential hypertension   IV drug abuse ([name]  Acute encephalopathy, resolved/post fall, poa -Presented with 4 days of confusion and lethargy -Head CT negative for acute intracranial abnormality and no focal neurologic deficits identified on exam -Likely toxic-metabolic in setting of HHS; sepsis not excluded though no meningismus; drug abuse could be contributing and UDS ordered -Plan to treat HHS as below, continue supportive care -If fails to resolve with treatment of HHS and electrolyte abnormalities, may need to expand the workup  HHS; insulin-dependent DM, improving - A1c was 7.4% in September 2018 -Pt follows with endocrinology and is managed with NPH 45 units qAM at home -Serum glucose is 666 on admission with AMS, no urine ketones or acidosis -Given 2 liters NS in ED and started on insulin infusion -Continue insulin infusion with frequent CBG's and serial chem panels  [name]  CKD 2, improving -SCr is 2.42 on admission, up from 1.10 in September 2018 -Likely a prerenal azotemia in setting of HHS with hypovolemia; ATN  possible -He has been fluid-resuscitated in ED and continued on IVF hydration -Renally-dose medications, following serial chem panels as above - cr 1.33 from 1.17 - Bmp am  Leukocytosis/lactic acidosis -WBC 19,400 on admission with lactate 11.32 -improving wbc 14 -No fever or apparent focus of infection -Likely secondary to HHS with hypotension on EMS arrival -Cultures were collected in ED and he was started on empiric abx -continue abx initially while following cultures and clinical course - blood cx x 2 no growth 2 days - repeat lactic acid, cbc am  Pseudo hyponatremia -Serum sodium is 131 on admissionCBS 666 -Secondary to marked hyperglycemia and hypovolemia -BMP am  Polysubstance abuse -Hx of alcohol and polysubstance abuse, including IVDA -Check UDS, monitor with CIWA and prn Ativan    Consultants:  none  Procedures:  none  Antimicrobials:  IV vanc and IV cefepime day #1   Discharge Exam: BP (!) 158/81   Pulse 76   Temp 98.3 F (36.8 C) (Oral)   Resp 16   Ht 6' (1.829 m)   Wt 68.6 kg (151 lb 3.8 oz)   SpO2 100%   BMI 20.51 kg/m     General:  62 yo CM  WD WN NAD A&O x 3   Cardiovascular: RRR no rubs or gallops  Respiratory: CTA no wheezes or rales   Abdomen: soft NT ND NBS x4   Musculoskeletal: non focal moves all extremities; no LE edema. Right big toe amputation . No drainage.  Skin: skin discoloration throughout  Psychiatry: Mood is anxious  Discharge Instructions You were cared for by a hospitalist during your hospital stay. If you have any questions about your discharge medications or the care you received while you were in the hospital after you are discharged, you can call the unit and asked to speak with the hospitalist on call if the hospitalist that took care of you is not available. Once you are discharged, your primary care physician will handle any further medical issues. Please note that NO REFILLS  for any discharge medications will be authorized once you are discharged, as it is imperative that you return to your primary care physician (or establish a relationship with a primary care physician if you do not have one) for your aftercare needs so that they can reassess your need for medications and monitor your lab values.   Allergies as of 04/21/2017      Reactions   Codeine Itching      Medication List    ASK your doctor about these medications   aspirin 81 MG EC tablet Take 1 tablet (81 mg total) by mouth daily.   insulin NPH Human 100 UNIT/ML injection Commonly known as:  NOVOLIN N Inject 0.45 mLs (45 Units total) into the skin daily before breakfast.      Allergies  Allergen Reactions  . Codeine Itching      The results of significant diagnostics from this hospitalization (including imaging, microbiology, ancillary and laboratory) are listed below for reference.    Significant Diagnostic Studies: Ct Head Wo Contrast  Result Date: 04/18/2017 CLINICAL DATA:  Altered level of consciousness. Hypotension. Hyperglycemia. EXAM: CT HEAD WITHOUT CONTRAST TECHNIQUE: Contiguous axial images were obtained from the base of the skull through the vertex without intravenous contrast. COMPARISON:  03/03/2010 FINDINGS: Brain: Mild cerebral atrophy. No ventricular dilatation. Patchy low-attenuation changes  in the deep white matter consistent with small vessel ischemia. No mass effect or midline shift. No abnormal extra-axial fluid collections. Gray-white matter junctions are distinct. Basal cisterns are not effaced. No acute intracranial hemorrhage. Vascular: Internal carotid artery and vertebrobasilar vascular calcifications. Skull: Calvarium appears intact. No depressed skull fractures. Subcentimeter calcification along the inner table of the right frontal region probably represents a small calcified meningioma. No change since prior study. Sinuses/Orbits: Paranasal sinuses and mastoid air  cells are not opacified. Old appearing displaced nasal bone fractures. Other: Congenital nonunion of the posterior arch of C1. IMPRESSION: No acute intracranial abnormalities. Mild chronic atrophy and small vessel ischemic changes. Old appearing nasal bone fractures. Electronically Signed   By: Lucienne Capers M.D.   On: 04/18/2017 21:44   Dg Chest Port 1 View  Result Date: 04/18/2017 CLINICAL DATA:  Altered mental status EXAM: PORTABLE CHEST 1 VIEW COMPARISON:  04/18/2016 FINDINGS: Normal heart and mediastinal contours. Clear lungs. No acute osseous abnormality. IMPRESSION: No active disease. Electronically Signed   By: Ashley Royalty M.D.   On: 04/18/2017 21:27   Dg Foot Complete Right  Result Date: 04/19/2017 CLINICAL DATA:  Pain and redness across the toes of the right foot. EXAM: RIGHT FOOT COMPLETE - 3+ VIEW COMPARISON:  07/27/2016 FINDINGS: Status post great toe amputation with some removal of the great toe metatarsal head. Although surgical borders of the bony osteotomy are somewhat irregular, there is no evidence for overt destruction to suggest infection. No bony destruction to suggest osteomyelitis elsewhere in the right foot. No evidence of fracture. No subluxation or dislocation. Extensive vascular calcification is again evident. IMPRESSION: Status post great toe amputation. No evidence for acute bony abnormality on the current exam. Electronically Signed   By: Misty Stanley M.D.   On: 04/19/2017 15:31    Microbiology: Recent Results (from the past 240 hour(s))  Urine culture     Status: None   Collection Time: 04/18/17  8:56 PM  Result Value Ref Range Status   Specimen Description URINE, CLEAN CATCH  Final   Special Requests NONE  Final   Culture   Final    NO GROWTH Performed at [facility], [address] [place], [place] [address]    Report Status 04/20/2017 FINAL  Final  Blood Culture (routine x 2)     Status: None   Collection Time: 04/18/17  9:24 PM  Result Value Ref  Range Status   Specimen Description BLOOD RIGHT ANTECUBITAL  Final   Special Requests   Final    BOTTLES DRAWN AEROBIC AND ANAEROBIC Blood Culture adequate volume   Culture   Final    NO GROWTH 5 DAYS Performed at [facility], [place] [address] [place], [place] [address]    Report Status 04/23/2017 FINAL  Final  Blood Culture (routine x 2)     Status: None   Collection Time: 04/18/17  9:25 PM  Result Value Ref Range Status   Specimen Description BLOOD LEFT HAND  Final   Special Requests   Final    BOTTLES DRAWN AEROBIC AND ANAEROBIC Blood Culture adequate volume   Culture   Final    NO GROWTH 5 DAYS Performed at [facility], [place] [address], [place], [place] [address]    Report Status 04/23/2017 FINAL  Final  Respiratory Panel by PCR     Status: None   Collection Time: 04/19/17 12:21 AM  Result Value Ref Range Status   Adenovirus NOT DETECTED  NOT DETECTED Final   Coronavirus 229E NOT DETECTED NOT DETECTED Final   Coronavirus HKU1 NOT DETECTED NOT DETECTED Final   Coronavirus NL63 NOT DETECTED NOT DETECTED Final   Coronavirus OC43 NOT DETECTED NOT DETECTED Final   Metapneumovirus NOT DETECTED NOT DETECTED Final   Rhinovirus / Enterovirus NOT DETECTED NOT DETECTED Final   Influenza A NOT DETECTED NOT DETECTED Final   Influenza B NOT DETECTED NOT DETECTED Final   Parainfluenza Virus 1 NOT DETECTED NOT DETECTED Final   Parainfluenza Virus 2 NOT DETECTED NOT DETECTED Final   Parainfluenza Virus 3 NOT DETECTED NOT DETECTED Final   Parainfluenza Virus 4 NOT DETECTED NOT DETECTED Final   Respiratory Syncytial Virus NOT DETECTED NOT DETECTED Final   Bordetella pertussis NOT DETECTED NOT DETECTED Final   Chlamydophila pneumoniae NOT DETECTED NOT DETECTED Final   Mycoplasma pneumoniae NOT DETECTED NOT DETECTED Final    Comment: Performed at [facility], [place] [address] [place], [place] [address]     Labs: Basic Metabolic Panel: No results for input(s): NA, K, CL,  CO2, GLUCOSE, BUN, CREATININE, CALCIUM, MG, PHOS in the last 168 hours. Liver Function Tests: No results for input(s): AST, ALT, ALKPHOS, BILITOT, PROT, ALBUMIN in the last 168 hours. No results for input(s): LIPASE, AMYLASE in the last 168 hours. No results for input(s): AMMONIA in the last 168 hours. CBC: No results for input(s): WBC, NEUTROABS, HGB, HCT, MCV, PLT in the last 168 hours. Cardiac Enzymes: No results for input(s): CKTOTAL, CKMB, CKMBINDEX, TROPONINI in the last 168 hours. BNP: BNP (last 3 results) No results for input(s): BNP in the last 8760 hours.  ProBNP (last 3 results) No results for input(s): PROBNP in the last 8760 hours.  CBG: No results for input(s): GLUCAP in the last 168 hours.     Signed:  Kayleen Memos, MD [name] 04/28/2017, 4:50 PM

## 2017-04-29 ENCOUNTER — Ambulatory Visit: Payer: Medicare HMO

## 2017-04-29 ENCOUNTER — Other Ambulatory Visit: Payer: Self-pay | Admitting: Pharmacist

## 2017-04-29 ENCOUNTER — Other Ambulatory Visit: Payer: Medicare HMO

## 2017-04-29 DIAGNOSIS — B182 Chronic viral hepatitis C: Secondary | ICD-10-CM

## 2017-04-30 ENCOUNTER — Ambulatory Visit: Payer: Medicare HMO | Admitting: Endocrinology

## 2017-04-30 LAB — COMPREHENSIVE METABOLIC PANEL
AG Ratio: 1.1 (calc) (ref 1.0–2.5)
ALT: 16 U/L (ref 9–46)
AST: 15 U/L (ref 10–35)
Albumin: 3.4 g/dL — ABNORMAL LOW (ref 3.6–5.1)
Alkaline phosphatase (APISO): 77 U/L (ref 40–115)
BUN: 19 mg/dL (ref 7–25)
CO2: 29 mmol/L (ref 20–32)
Calcium: 9.5 mg/dL (ref 8.6–10.3)
Chloride: 100 mmol/L (ref 98–110)
Creat: 1.17 mg/dL (ref 0.70–1.25)
GLOBULIN: 3.1 g/dL (ref 1.9–3.7)
GLUCOSE: 298 mg/dL — AB (ref 65–99)
POTASSIUM: 4.9 mmol/L (ref 3.5–5.3)
Sodium: 136 mmol/L (ref 135–146)
Total Bilirubin: 0.3 mg/dL (ref 0.2–1.2)
Total Protein: 6.5 g/dL (ref 6.1–8.1)

## 2017-05-01 ENCOUNTER — Telehealth: Payer: Self-pay | Admitting: Pharmacist

## 2017-05-01 LAB — HEPATITIS C RNA QUANTITATIVE
HCV Quantitative Log: <1.18 Log IU/mL
HCV RNA, PCR, QN: <15 IU/mL

## 2017-05-01 NOTE — Telephone Encounter (Signed)
Called [name] to let him know that his SVR12 Hep C viral load was still undetectable so he is cured of his Hepatitis C.  Emphasized the need to stay away from risky behavior so he does not get reinfected.  He was happy.

## 2017-05-05 ENCOUNTER — Ambulatory Visit: Payer: Medicare HMO

## 2017-05-05 ENCOUNTER — Telehealth: Payer: Self-pay | Admitting: Endocrinology

## 2017-05-05 NOTE — Telephone Encounter (Signed)
Pt stated that his sugars are very low He said he needs some referrals sent over. He has had his toe amputated and he is stating it is still sore and wants dr to refer him somewhere to get it looked at .  He stated he was going to wound center but they discharged him.   Please advise

## 2017-05-05 NOTE — Telephone Encounter (Signed)
Reduce insulin to 40 units qam Ov next available Please ask PCP other questions.

## 2017-05-05 NOTE — Telephone Encounter (Signed)
I called patient & gave him new insulin dosage. He has appt 2/06 he stated he would keep that appointment & call bsck if he needed to get in before then.

## 2017-05-21 ENCOUNTER — Ambulatory Visit (INDEPENDENT_AMBULATORY_CARE_PROVIDER_SITE_OTHER): Payer: Medicare HMO | Admitting: Endocrinology

## 2017-05-21 ENCOUNTER — Encounter: Payer: Self-pay | Admitting: Endocrinology

## 2017-05-21 VITALS — BP 110/72 | HR 78 | Ht 72.0 in | Wt 156.6 lb

## 2017-05-21 DIAGNOSIS — E1010 Type 1 diabetes mellitus with ketoacidosis without coma: Secondary | ICD-10-CM

## 2017-05-21 NOTE — Patient Instructions (Addendum)
check your blood sugar twice a day.  vary the time of day when you check, between before the 3 meals, and at bedtime.  also check if you have symptoms of your blood sugar being too high or too low.  please keep a record of the readings and bring it to your next appointment here (or you can bring the meter itself).  You can write it on any piece of paper.  please call us sooner if your blood sugar goes below 70, or if you have a lot of readings over 200.  Here are some phone numbers, to get free strips.  Please continue the same insulin. On this type of insulin schedule, you should eat meals on a regular schedule.  If a meal is missed or significantly.  delayed, your blood sugar could go low.   please call [phone] ([facility] physician referral line), to get an appointment with a new primary doctor.   Please come back for a follow-up appointment in 2 months.

## 2017-05-21 NOTE — Progress Notes (Signed)
Subjective:    Patient ID: [name], male    DOB: [birth year], 62 y.o.   MRN: [id number]  HPI Pt returns for f/u of diabetes mellitus: DM type: 1 Dx'ed: 1287 Complications: polyneuropathy, nephropathy, toe amputation, PAD, and retinopathy.   Therapy: insulin since dx.   DKA: never Severe hypoglycemia: multiple episodes (last was in early 2019).   Pancreatitis: never Other: he has done better with a qd insulin regimen; he was changed from lantus to levemir, due to mild hypoglycemia in the early hours of the morning; therapy limited by ongoing use of heroin, EtOH, and cocaine; he says he cannot afford insulin analogs.  Interval history: Pt says he recently had another episode of severe hypoglycemia, in the middle of the night.  He takes 40-45 units qam.  He does not check cbg's.    Past Medical History:  Diagnosis Date  . DEPRESSION   . DIABETES MELLITUS, TYPE I   . DRUG ABUSE    pt should have NO controlled substances rx'ed  . Glaucoma   . HEPATITIS C    chronic  . Hypertension 02/18/2011  . 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 Vitiligo     Past Surgical History:  Procedure Laterality Date  . ABDOMINAL AORTOGRAM W/LOWER EXTREMITY N/A 07/25/2016   Procedure: Abdominal Aortogram w/Bilateral Lower Extremity Runoff;  Surgeon: Conrad [place], MD;  Location: [facility];  Service: Cardiovascular;  Laterality: N/A;  . ABDOMINAL AORTOGRAM W/LOWER EXTREMITY N/A 12/24/2016   Procedure: ABDOMINAL AORTOGRAM W/LOWER EXTREMITY;  Surgeon: Serafina Mitchell, MD;  Location: [facility];  Service: Cardiovascular;  Laterality: N/A;  . AMPUTATION TOE Right 07/26/2016   Procedure: AMPUTATION GREAT TOE;  Surgeon: Serafina Mitchell, MD;  Location: [facility];  Service: Vascular;  Laterality: Right;  . EYE SURGERY     retinal surgery x 2, right eye  . INSERTION OF ILIAC STENT Right 07/26/2016   Procedure: INSERTION OF RIGHT POPLITEAL STENT WITH BALLOON ANGIOPLASTY;  Surgeon: Serafina Mitchell, MD;  Location: [facility];  Service: Vascular;  Laterality: Right;  . LOWER EXTREMITY ANGIOGRAM Right 07/26/2016   Procedure: LOWER EXTREMITY ANGIOGRAM;  Surgeon: Serafina Mitchell, MD;  Location: [facility];  Service: Vascular;  Laterality: Right;  . PERIPHERAL VASCULAR BALLOON ANGIOPLASTY Right 07/26/2016   Procedure: PERIPHERAL VASCULAR BALLOON ANGIOPLASTY  RIGHT ANTERIOR TIBEAL ARTERY AND RIGHT SUPERFICIAL FEMORAL ARTERY;  Surgeon: Serafina Mitchell, MD;  Location: [facility];  Service: Vascular;  Laterality: Right;  . PERIPHERAL VASCULAR INTERVENTION Right 12/24/2016   Procedure: PERIPHERAL VASCULAR INTERVENTION;  Surgeon: Serafina Mitchell, MD;  Location: [facility];  Service: Cardiovascular;  Laterality: Right;  lower extr    Social History   Socioeconomic History  . Marital status: Single    Spouse name: Not on file  . Number of children: Not on file  . Years of education: Not on file  . Highest education level: Not on file  Social Needs  . Financial resource strain: Not on file  . Food insecurity - worry: Not on file  . Food insecurity - inability: Not on file  . Transportation needs - medical: Not on file  . Transportation needs - non-medical: Not on file  Occupational History    Employer: UNEMPLOYED    Comment: disabled  Tobacco Use  . Smoking status: Current Every Day Smoker    Packs/day: 0.25    Types: Cigarettes  . Smokeless tobacco: Never Used  . Tobacco comment:  cutting back  Substance and Sexual Activity  . Alcohol use: No    Alcohol/week: 0.0 oz    Comment: 08/14/14 none  . Drug use: Yes    Types: "Crack" cocaine, Marijuana, Heroin    Comment: s/p rehab x 6, occasional drug use (per pt last  use was 06/2015)  . Sexual activity: Not Currently  Other Topics Concern  . Not on file  Social History Narrative   Lives in boarding house/homeless   Ongoing occ drug use-crack   Single, disabled, prev mental health drug counselor.     Current Outpatient Medications on  File Prior to Visit  Medication Sig Dispense Refill  . aspirin EC 81 MG EC tablet Take 1 tablet (81 mg total) by mouth daily. 180 tablet 0  . insulin NPH Human (NOVOLIN N) 100 UNIT/ML injection Inject 0.45 mLs (45 Units total) into the skin daily before breakfast. 20 mL 11   No current facility-administered medications on file prior to visit.     Allergies  Allergen Reactions  . Codeine Itching    Family History  Problem Relation Age of Onset  . Diabetes Father   . Kidney disease Father   . Arthritis Mother   . Heart disease Mother        CAD    BP 110/72 (BP Location: Left Arm, Patient Position: Sitting, Cuff Size: Normal)   Pulse 78   Ht 6' (1.829 m)   Wt 156 lb 9.6 oz (71 kg)   SpO2 97%   BMI 21.24 kg/m    Review of Systems He has lost more weight.     Objective:   Physical Exam VITAL SIGNS:  See vs page GENERAL: no distress Pulses: dorsalis pedis intact bilat.   MSK: no deformity of the feet CV: no leg edema Skin:  no ulcer on the feet.  normal color and temp on the feet. Neuro: sensation is intact to touch on the feet, but severely decreased from normal.  Ext: right great toe is amputated.     Lab Results  Component Value Date   HGBA1C 8.4 (H) 04/20/2017   Lab Results  Component Value Date   CREATININE 1.17 04/29/2017   BUN 19 04/29/2017   NA 136 04/29/2017   K 4.9 04/29/2017   CL 100 04/29/2017   CO2 29 04/29/2017       Assessment & Plan:  Type 1 DM, with PAD: The pattern of his cbg's indicates he needs a faster-acting qd insulin Hypoglycemia, severe.  He declines to change to 70/30. Noncompliance with cbg recording. He is not a candidate for aggressive glycemic control.  Patient Instructions  check your blood sugar twice a day.  vary the time of day when you check, between before the 3 meals, and at bedtime.  also check if you have symptoms of your blood sugar being too high or too low.  please keep a record of the readings and bring it to  your next appointment here (or you can bring the meter itself).  You can write it on any piece of paper.  please call us sooner if your blood sugar goes below 70, or if you have a lot of readings over 200.  Here are some phone numbers, to get free strips.  Please continue the same insulin. On this type of insulin schedule, you should eat meals on a regular schedule.  If a meal is missed or significantly.  delayed, your blood sugar could go low.   please call  [phone] ([facility] physician referral line), to get an appointment with a new primary doctor.   Please come back for a follow-up appointment in 2 months.

## 2017-07-09 ENCOUNTER — Telehealth: Payer: Self-pay | Admitting: Endocrinology

## 2017-07-09 NOTE — Telephone Encounter (Signed)
Patient is out of insulin and cannot afford to get refill so he is substituting Lantus 48 units (he used to take 45-50 units of Humalin). How many units of Lantus should he take to be equivalent with what he took with Humalin. Blood sugar was 331 today (after taking Lantus). Please call patient to advise.

## 2017-07-09 NOTE — Telephone Encounter (Signed)
The number of units is the same.  However, when you take in the morning (best time), the NPH works more in the afternoon and evening, whereas the lantus is more of a 24-HR insulin.

## 2017-07-09 NOTE — Telephone Encounter (Signed)
Patient is taking someone else's insulin & cannot afford his own. He is wanting you to give him a dosage of lantus so he can use this medication up of his friends without having to buy anything. Please advise?

## 2017-07-10 NOTE — Telephone Encounter (Signed)
I called and advised patient. He stated that he would take lantus in the morning & that he would be able to get NPH next week.

## 2017-07-16 ENCOUNTER — Emergency Department (HOSPITAL_COMMUNITY)
Admission: EM | Admit: 2017-07-16 | Discharge: 2017-07-17 | Disposition: A | Discharge status: Home / Self Care | Payer: Medicare HMO | Attending: Emergency Medicine | Admitting: Emergency Medicine

## 2017-07-16 DIAGNOSIS — F17210 Nicotine dependence, cigarettes, uncomplicated: Secondary | ICD-10-CM | POA: Diagnosis not present

## 2017-07-16 DIAGNOSIS — E10649 Type 1 diabetes mellitus with hypoglycemia without coma: Secondary | ICD-10-CM | POA: Diagnosis not present

## 2017-07-16 DIAGNOSIS — Z7982 Long term (current) use of aspirin: Secondary | ICD-10-CM | POA: Insufficient documentation

## 2017-07-16 DIAGNOSIS — I10 Essential (primary) hypertension: Secondary | ICD-10-CM | POA: Diagnosis not present

## 2017-07-16 DIAGNOSIS — J449 Chronic obstructive pulmonary disease, unspecified: Secondary | ICD-10-CM | POA: Diagnosis not present

## 2017-07-16 DIAGNOSIS — E162 Hypoglycemia, unspecified: Secondary | ICD-10-CM

## 2017-07-16 DIAGNOSIS — R402441 Other coma, without documented Glasgow coma scale score, or with partial score reported, in the field [EMT or ambulance]: Secondary | ICD-10-CM | POA: Diagnosis not present

## 2017-07-16 NOTE — ED Triage Notes (Signed)
Pt from home friend called EMS because of bizzare by CBG checked 56  12.5 G D10 given IV CBG increase to 107 but dropped to 47 after sandwich and bowl ceral second dose D10 12.5 Grams given CBG remain low pt transported ED. Last CBG 97 on arrival

## 2017-07-16 NOTE — ED Notes (Signed)
Bed: UZ14 Expected date:  Expected time:  Means of arrival:  Comments: 58 yr hypoglycemic

## 2017-07-17 ENCOUNTER — Other Ambulatory Visit: Payer: Self-pay

## 2017-07-17 LAB — CBC WITH DIFFERENTIAL/PLATELET
BASOS ABS: 0.1 10*3/uL (ref 0.0–0.1)
BASOS PCT: 1 %
Eosinophils Absolute: 0.1 10*3/uL (ref 0.0–0.7)
Eosinophils Relative: 1 %
HEMATOCRIT: 36.8 % — AB (ref 39.0–52.0)
Hemoglobin: 12.6 g/dL — ABNORMAL LOW (ref 13.0–17.0)
Lymphocytes Relative: 16 %
Lymphs Abs: 1.8 10*3/uL (ref 0.7–4.0)
MCH: 30.7 pg (ref 26.0–34.0)
MCHC: 34.2 g/dL (ref 30.0–36.0)
MCV: 89.8 fL (ref 78.0–100.0)
MONO ABS: 1 10*3/uL (ref 0.1–1.0)
Monocytes Relative: 9 %
Neutro Abs: 8.4 10*3/uL — ABNORMAL HIGH (ref 1.7–7.7)
Neutrophils Relative %: 73 %
PLATELETS: 329 10*3/uL (ref 150–400)
RBC: 4.1 MIL/uL — AB (ref 4.22–5.81)
RDW: 13.4 % (ref 11.5–15.5)
WBC: 11.4 10*3/uL — AB (ref 4.0–10.5)

## 2017-07-17 LAB — BASIC METABOLIC PANEL
ANION GAP: 6 (ref 5–15)
BUN: 22 mg/dL — ABNORMAL HIGH (ref 6–20)
CO2: 27 mmol/L (ref 22–32)
Calcium: 9.3 mg/dL (ref 8.9–10.3)
Chloride: 107 mmol/L (ref 101–111)
Creatinine, Ser: 1.07 mg/dL (ref 0.61–1.24)
Glucose, Bld: 113 mg/dL — ABNORMAL HIGH (ref 65–99)
Potassium: 4.3 mmol/L (ref 3.5–5.1)
Sodium: 140 mmol/L (ref 135–145)

## 2017-07-17 LAB — CBG MONITORING, ED
GLUCOSE-CAPILLARY: 109 mg/dL — AB (ref 65–99)
Glucose-Capillary: 135 mg/dL — ABNORMAL HIGH (ref 65–99)

## 2017-07-17 NOTE — ED Provider Notes (Signed)
[facility] DEPT Provider Note   CSN: [id number] Arrival date & time: 07/16/17  2347     History   Chief Complaint Chief Complaint  Patient presents with  . Hypoglycemia    HPI [name] is a 62 y.o. male.  Patient with past medical history of type 1 diabetes insulin controlled presents to the emergency department with chief complaint of hypoglycemia.  He was found down by his roommate, who called EMS.  Patient's blood sugar was in the 50s and he was unresponsive.  He was given glucose at the scene.  Patient reports that he has been out of his insulin, and borrowed 1 of his friends Lantus.  He states that he may have hit his head when he passed out.  He denies any other associated symptoms.  Denies numbness, weakness, or tingling.  He states that he feels hungry now, but denies any other complaints.  The history is provided by the patient. No language interpreter was used.    Past Medical History:  Diagnosis Date  . DEPRESSION   . DIABETES MELLITUS, TYPE I   . DRUG ABUSE    pt should have NO controlled substances rx'ed  . Glaucoma   . HEPATITIS C    chronic  . Hypertension 02/18/2011  . 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 Vitiligo     Patient Active Problem List   Diagnosis Date Noted  . IV drug abuse ([place]) 04/19/2017  . Acute encephalopathy 04/19/2017  . Hemangioma 10/02/2016  . Foreign body (FB) in soft tissue   . 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[place])   . Gangrene ([place])   . Type 1 diabetes mellitus with hyperosmolarity without nonketotic hyperglycemic hyperosmolar coma ([place])   . Tobacco abuse   . Essential hypertension   . Diabetic infection of right foot ([place]) 07/22/2016  . Diabetic foot infection ([place]) 07/22/2016  . Chronic ulcer of heel, right, with fat layer exposed ([place]) 05/20/2016  . DKA, type 1 ([place]) 04/18/2016  . Acute kidney injury ([place]) 04/18/2016  . Hyponatremia 04/18/2016  . Severe protein-calorie  malnutrition ([place]) 04/18/2016  . Elevated troponin 04/18/2016  . Poor dentition 07/12/2015  . Tobacco use 07/06/2013  . PAD (peripheral artery disease) ([place]) 04/29/2013  . COPD, mild ([place])   . 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 ([place]) 11/18/2011  . Diabetic neuropathy ([place]) 06/21/2010  . PROLIFERATIVE DIABETIC RETINOPATHY 06/21/2010  . SKIN TAG 01/30/2010  . Chronic hepatitis C ([place]) 11/22/2008  . VITILIGO 11/22/2008  . PROTEINURIA, MILD 11/22/2008  . Substance abuse ([place]) 04/23/2007  . DEPRESSION 11/11/2006    Past Surgical History:  Procedure Laterality Date  . ABDOMINAL AORTOGRAM W/LOWER EXTREMITY N/A 07/25/2016   Procedure: Abdominal Aortogram w/Bilateral Lower Extremity Runoff;  Surgeon: Conrad , MD;  Location: [facility];  Service: Cardiovascular;  Laterality: N/A;  . ABDOMINAL AORTOGRAM W/LOWER EXTREMITY N/A 12/24/2016   Procedure: ABDOMINAL AORTOGRAM W/LOWER EXTREMITY;  Surgeon: Serafina Mitchell, MD;  Location: [facility];  Service: Cardiovascular;  Laterality: N/A;  . AMPUTATION TOE Right 07/26/2016   Procedure: AMPUTATION GREAT TOE;  Surgeon: Serafina Mitchell, MD;  Location: [facility];  Service: Vascular;  Laterality: Right;  . EYE SURGERY     retinal surgery x 2, right eye  . INSERTION OF ILIAC STENT Right 07/26/2016   Procedure: INSERTION OF RIGHT POPLITEAL STENT WITH BALLOON ANGIOPLASTY;  Surgeon: Serafina Mitchell, MD;  Location: [facility];  Service: Vascular;  Laterality: Right;  . LOWER EXTREMITY ANGIOGRAM Right 07/26/2016   Procedure: LOWER EXTREMITY ANGIOGRAM;  Surgeon: Serafina Mitchell, MD;  Location: [facility];  Service: Vascular;  Laterality: Right;  . PERIPHERAL VASCULAR BALLOON ANGIOPLASTY Right 07/26/2016   Procedure: PERIPHERAL VASCULAR BALLOON ANGIOPLASTY  RIGHT ANTERIOR TIBEAL ARTERY AND RIGHT SUPERFICIAL FEMORAL ARTERY;  Surgeon: Serafina Mitchell, MD;  Location: [facility];  Service: Vascular;  Laterality: Right;  . PERIPHERAL VASCULAR  INTERVENTION Right 12/24/2016   Procedure: PERIPHERAL VASCULAR INTERVENTION;  Surgeon: Serafina Mitchell, MD;  Location: [facility];  Service: Cardiovascular;  Laterality: Right;  lower extr        Home Medications    Prior to Admission medications   Medication Sig Start Date End Date Taking? Authorizing Provider  aspirin EC 81 MG EC tablet Take 1 tablet (81 mg total) by mouth daily. 07/30/16   Lavina Hamman, MD  insulin NPH Human (NOVOLIN N) 100 UNIT/ML injection Inject 0.45 mLs (45 Units total) into the skin daily before breakfast. 01/07/17   Renato Shin, MD    Family History Family History  Problem Relation Age of Onset  . Diabetes Father   . Kidney disease Father   . Arthritis Mother   . Heart disease Mother        CAD    Social History Social History   Tobacco Use  . Smoking status: Current Every Day Smoker    Packs/day: 0.25    Types: Cigarettes  . Smokeless tobacco: Never Used  . Tobacco comment: cutting back  Substance Use Topics  . Alcohol use: No    Alcohol/week: 0.0 oz    Comment: 08/14/14 none  . Drug use: Yes    Types: "Crack" cocaine, Marijuana, Heroin    Comment: s/p rehab x 6, occasional drug use (per pt last  use was 06/2015)     Allergies   Codeine   Review of Systems Review of Systems  All other systems reviewed and are negative.    Physical Exam Updated Vital Signs BP (!) 179/81 (BP Location: Right Arm)   Pulse 87   Temp 98.2 F (36.8 C) (Oral)   Resp 18   Ht 6' (1.829 m)   Wt 73.9 kg (163 lb)   SpO2 99%   BMI 22.11 kg/m   Physical Exam  Constitutional: He is oriented to person, place, and time. He appears well-developed and well-nourished.  HENT:  Head: Normocephalic and atraumatic.  Minor abrasion to right forehead  Eyes: Pupils are equal, round, and reactive to light. Conjunctivae and EOM are normal. Right eye exhibits no discharge. Left eye exhibits no discharge. No scleral icterus.  Neck: Normal range of motion.  Neck supple. No JVD present.  Cardiovascular: Normal rate, regular rhythm and normal heart sounds. Exam reveals no gallop and no friction rub.  No murmur heard. Pulmonary/Chest: Effort normal and breath sounds normal. No respiratory distress. He has no wheezes. He has no rales. He exhibits no tenderness.  Abdominal: Soft. He exhibits no distension and no mass. There is no tenderness. There is no rebound and no guarding.  Musculoskeletal: Normal range of motion. He exhibits no edema or tenderness.  Neurological: He is alert and oriented to person, place, and time.  Skin: Skin is warm and dry.  Psychiatric: He has a normal mood and affect. His behavior is normal. Judgment and thought content normal.  Nursing note and vitals reviewed.    ED Treatments / Results  Labs (all labs ordered  are listed, but only abnormal results are displayed) Labs Reviewed  CBG MONITORING, ED - Abnormal; Notable for the following components:      Result Value   Glucose-Capillary 109 (*)    All other components within normal limits  CBC WITH DIFFERENTIAL/PLATELET  BASIC METABOLIC PANEL    EKG None  Radiology No results found.  Procedures Procedures (including critical care time)  Medications Ordered in ED Medications - No data to display   Initial Impression / Assessment and Plan / ED Course  I have reviewed the triage vital signs and the nursing notes.  Pertinent labs & imaging results that were available during my care of the patient were reviewed by me and considered in my medical decision making (see chart for details).     Patient with hypoglycemia.  He used his friend's Lantus.  CBG was initially 56, he was given 12.5 of D10, CBG increased to 107, but then dropped to 47.  Given another dose by EMS and brought to the ED.  Alert and oriented here.  Will monitor and recheck CBG to ensure it doesn't drop again.  3:28 AM CBG 135.  A&Ox4.  DC to home.  Final Clinical Impressions(s) / ED  Diagnoses   Final diagnoses:  Hypoglycemia    ED Discharge Orders    None       Montine Circle, PA-C 49/70/26 [id number]    Delora Fuel, MD 88/50/27 [phone]

## 2017-07-17 NOTE — ED Notes (Signed)
Patient states when he woke up tonight he was surrounded by EMS and has no recollection of what happened. Patient lives with a roommate and was told that he was laying down and hitting his head against a chair. Patient now complaining of a headache.

## 2017-07-17 NOTE — ED Notes (Signed)
Patient extremely disrespectful and angry about not having a ride home or a bus pass to go home. Discharge expedited after a repeat CBG of 135. Did not get repeat vitals. Patient escorted to lobby due to continuously swearing and throwing his discharge papers onto the ground.

## 2017-07-18 ENCOUNTER — Ambulatory Visit: Payer: Medicare HMO | Admitting: Endocrinology

## 2017-07-21 ENCOUNTER — Encounter: Payer: Self-pay | Admitting: Endocrinology

## 2017-07-21 ENCOUNTER — Ambulatory Visit (INDEPENDENT_AMBULATORY_CARE_PROVIDER_SITE_OTHER): Payer: Medicare HMO | Admitting: Endocrinology

## 2017-07-21 VITALS — BP 152/62 | HR 83 | Wt 163.6 lb

## 2017-07-21 DIAGNOSIS — E1010 Type 1 diabetes mellitus with ketoacidosis without coma: Secondary | ICD-10-CM | POA: Diagnosis not present

## 2017-07-21 LAB — POCT GLYCOSYLATED HEMOGLOBIN (HGB A1C): Hemoglobin A1C: 6.8

## 2017-07-21 LAB — POCT CBG (FASTING - GLUCOSE)-MANUAL ENTRY: Glucose Fasting, POC: 208 mg/dL — AB (ref 70–99)

## 2017-07-21 MED ORDER — INSULIN NPH (HUMAN) (ISOPHANE) 100 UNIT/ML ~~LOC~~ SUSP: 43.0000 [IU] | Freq: Every day | SUBCUTANEOUS | 11 refills | Status: DC

## 2017-07-21 NOTE — Patient Instructions (Addendum)
check your blood sugar twice a day.  vary the time of day when you check, between before the 3 meals, and at bedtime.  also check if you have symptoms of your blood sugar being too high or too low.  please keep a record of the readings and bring it to your next appointment here (or you can bring the meter itself).  You can write it on any piece of paper.  please call us sooner if your blood sugar goes below 70, or if you have a lot of readings over 200.  Here are some phone numbers, to get free strips.  Please reduce the insulin to 43 units each morning. Here is a form for free lilly insulin.  Please fill out your part, and return to [place].  We'll then do our part, and we'll fax in.  On this type of insulin schedule, you should eat meals on a regular schedule.  If a meal is missed or significantly.  delayed, your blood sugar could go low.   please call [phone] ([facility] physician referral line), to get an appointment with a new primary doctor.   Please come back for a follow-up appointment in 3 months.

## 2017-07-21 NOTE — Progress Notes (Signed)
Subjective:    Patient ID: [name], male    DOB: Oct 13, 1955, 62 y.o.   MRN: [id number]  HPI Pt returns for f/u of diabetes mellitus: DM type: 1 Dx'ed: 6761 Complications: severe polyneuropathy of all 4's, nephropathy, toe amputation, PAD, and PDR.   Therapy: insulin since dx.   DKA: never Severe hypoglycemia: multiple episodes (last was in early 2019).   Pancreatitis: never Other: he has done better with a qd insulin regimen; he was changed from lantus to levemir, due to mild hypoglycemia in the early hours of the morning; therapy is limited by ongoing substance abuse; he says he cannot afford insulin analogs.  Interval history: Pt says he recently had another episode of severe hypoglycemia, at 1 AM.  He says this happens when he borrowed lantus from a friend, and took 48 units qd.  He does not check cbg's.   Past Medical History:  Diagnosis Date  . DEPRESSION   . DIABETES MELLITUS, TYPE I   . DRUG ABUSE    pt should have NO controlled substances rx'ed  . Glaucoma   . HEPATITIS C    chronic  . Hypertension 02/18/2011  . 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 Vitiligo     Past Surgical History:  Procedure Laterality Date  . ABDOMINAL AORTOGRAM W/LOWER EXTREMITY N/A 07/25/2016   Procedure: Abdominal Aortogram w/Bilateral Lower Extremity Runoff;  Surgeon: Conrad Virgil, MD;  Location: [facility];  Service: Cardiovascular;  Laterality: N/A;  . ABDOMINAL AORTOGRAM W/LOWER EXTREMITY N/A 12/24/2016   Procedure: ABDOMINAL AORTOGRAM W/LOWER EXTREMITY;  Surgeon: Serafina Mitchell, MD;  Location: [facility];  Service: Cardiovascular;  Laterality: N/A;  . AMPUTATION TOE Right 07/26/2016   Procedure: AMPUTATION GREAT TOE;  Surgeon: Serafina Mitchell, MD;  Location: [facility];  Service: Vascular;  Laterality: Right;  . EYE SURGERY     retinal surgery x 2, right eye  . INSERTION OF ILIAC STENT Right 07/26/2016   Procedure: INSERTION OF RIGHT POPLITEAL STENT WITH BALLOON  ANGIOPLASTY;  Surgeon: Serafina Mitchell, MD;  Location: [facility];  Service: Vascular;  Laterality: Right;  . LOWER EXTREMITY ANGIOGRAM Right 07/26/2016   Procedure: LOWER EXTREMITY ANGIOGRAM;  Surgeon: Serafina Mitchell, MD;  Location: [facility];  Service: Vascular;  Laterality: Right;  . PERIPHERAL VASCULAR BALLOON ANGIOPLASTY Right 07/26/2016   Procedure: PERIPHERAL VASCULAR BALLOON ANGIOPLASTY  RIGHT ANTERIOR TIBEAL ARTERY AND RIGHT SUPERFICIAL FEMORAL ARTERY;  Surgeon: Serafina Mitchell, MD;  Location: [facility];  Service: Vascular;  Laterality: Right;  . PERIPHERAL VASCULAR INTERVENTION Right 12/24/2016   Procedure: PERIPHERAL VASCULAR INTERVENTION;  Surgeon: Serafina Mitchell, MD;  Location: [facility];  Service: Cardiovascular;  Laterality: Right;  lower extr    Social History   Socioeconomic History  . Marital status: Single    Spouse name: Not on file  . Number of children: Not on file  . Years of education: Not on file  . Highest education level: Not on file  Occupational History    Employer: UNEMPLOYED    Comment: disabled  Social Needs  . Financial resource strain: Not on file  . Food insecurity:    Worry: Not on file    Inability: Not on file  . Transportation needs:    Medical: Not on file    Non-medical: Not on file  Tobacco Use  . Smoking status: Current Every Day Smoker    Packs/day: 0.25    Types: Cigarettes  .  Smokeless tobacco: Never Used  . Tobacco comment: cutting back  Substance and Sexual Activity  . Alcohol use: No    Alcohol/week: 0.0 oz    Comment: 08/14/14 none  . Drug use: Yes    Types: "Crack" cocaine, Marijuana, Heroin    Comment: s/p rehab x 6, occasional drug use (per pt last  use was 06/2015)  . Sexual activity: Not Currently  Lifestyle  . Physical activity:    Days per week: Not on file    Minutes per session: Not on file  . Stress: Not on file  Relationships  . Social connections:    Talks on phone: Not on file    Gets together: Not on file     Attends religious service: Not on file    Active member of club or organization: Not on file    Attends meetings of clubs or organizations: Not on file    Relationship status: Not on file  . Intimate partner violence:    Fear of current or ex partner: Not on file    Emotionally abused: Not on file    Physically abused: Not on file    Forced sexual activity: Not on file  Other Topics Concern  . Not on file  Social History Narrative   Lives in boarding house/homeless   Ongoing occ drug use-crack   Single, disabled, prev mental health drug counselor.     Current Outpatient Medications on File Prior to Visit  Medication Sig Dispense Refill  . aspirin EC 81 MG EC tablet Take 1 tablet (81 mg total) by mouth daily. (Patient not taking: Reported on 07/17/2017) 180 tablet 0   No current facility-administered medications on file prior to visit.     Allergies  Allergen Reactions  . Codeine Itching    Family History  Problem Relation Age of Onset  . Diabetes Father   . Kidney disease Father   . Arthritis Mother   . Heart disease Mother        CAD    BP (!) 152/62 (BP Location: Left Arm, Patient Position: Sitting, Cuff Size: Normal)   Pulse 83   Wt 163 lb 9.6 oz (74.2 kg)   SpO2 95%   BMI 22.19 kg/m    Review of Systems He has gained a few lbs.    Objective:   Physical Exam VITAL SIGNS:  See vs page GENERAL: no distress Pulses: dorsalis pedis intact bilat.   MSK: no deformity of the feet, except right 5th toe is medially deviated.   CV: no leg edema Skin:  no ulcer on the feet.  normal color and temp on the feet. Neuro: sensation is intact to touch on the feet, but severely decreased from normal.  Ext: right great toe is amputated.   Lab Results  Component Value Date   CREATININE 1.07 07/17/2017   BUN 22 (H) 07/17/2017   NA 140 07/17/2017   K 4.3 07/17/2017   CL 107 07/17/2017   CO2 27 07/17/2017   Lab Results  Component Value Date   HGBA1C 6.8 07/21/2017       Assessment & Plan:  Type 1 DM, with PAD: overcontrolled, given this insulin regimen, which does match insulin to his changing needs throughout the day.  Hypoglycemia: worse.    Patient Instructions  check your blood sugar twice a day.  vary the time of day when you check, between before the 3 meals, and at bedtime.  also check if you have symptoms of your  blood sugar being too high or too low.  please keep a record of the readings and bring it to your next appointment here (or you can bring the meter itself).  You can write it on any piece of paper.  please call us sooner if your blood sugar goes below 70, or if you have a lot of readings over 200.  Here are some phone numbers, to get free strips.  Please reduce the insulin to 43 units each morning. Here is a form for free lilly insulin.  Please fill out your part, and return to [place].  We'll then do our part, and we'll fax in.  On this type of insulin schedule, you should eat meals on a regular schedule.  If a meal is missed or significantly.  delayed, your blood sugar could go low.   please call [phone] ([facility] physician referral line), to get an appointment with a new primary doctor.   Please come back for a follow-up appointment in 3 months.

## 2017-08-07 ENCOUNTER — Telehealth: Payer: Self-pay | Admitting: Endocrinology

## 2017-08-07 NOTE — Telephone Encounter (Signed)
Patient stated he had a form sent in from [facility] for his insulin that the doctor fills out . He stated that the doctor has filled this out and wanted to know if it has been sent in since he has not heard from anyone from [facility]   Please advise

## 2017-08-07 NOTE — Telephone Encounter (Signed)
last I knew, I gave to pt at last ov, and asked pt to bring back when completed.

## 2017-08-07 NOTE — Telephone Encounter (Signed)
I spoke with patient & he stated that he dropped [employer] form off a few days after he was here on 4/8. I did not see these forms & did not fax them. I am currently trying to locate them. Patient stated because he is on disability he will not be bale to afford any insulin including the $25 Relion NPH for another week or so. He is almost out so I was able to find one Humulin N pen & patient will be by this afternoon to pick that up.

## 2017-08-07 NOTE — Telephone Encounter (Signed)
Patient is calling on the status of paper work that was sent to [employer]. Please advise

## 2017-08-08 NOTE — Telephone Encounter (Signed)
I spoke with patient & explained to him that I had searched for [employer] form. I could not find it in any of the sent faxes or Dr. Cordelia Pen or my box. I have printed off a new form for him to pick up to fill out & he stated that he would be in early next week to pick that up.

## 2017-08-13 DIAGNOSIS — E162 Hypoglycemia, unspecified: Secondary | ICD-10-CM | POA: Diagnosis not present

## 2017-08-13 DIAGNOSIS — E161 Other hypoglycemia: Secondary | ICD-10-CM | POA: Diagnosis not present

## 2017-10-29 ENCOUNTER — Ambulatory Visit: Payer: Medicare HMO | Admitting: Endocrinology

## 2017-10-29 DIAGNOSIS — Z0289 Encounter for other administrative examinations: Secondary | ICD-10-CM

## 2017-11-16 DIAGNOSIS — E161 Other hypoglycemia: Secondary | ICD-10-CM | POA: Diagnosis not present

## 2017-11-16 DIAGNOSIS — E162 Hypoglycemia, unspecified: Secondary | ICD-10-CM | POA: Diagnosis not present

## 2017-11-16 DIAGNOSIS — R456 Violent behavior: Secondary | ICD-10-CM | POA: Diagnosis not present

## 2017-11-16 DIAGNOSIS — I10 Essential (primary) hypertension: Secondary | ICD-10-CM | POA: Diagnosis not present

## 2017-11-16 DIAGNOSIS — R404 Transient alteration of awareness: Secondary | ICD-10-CM | POA: Diagnosis not present

## 2017-12-17 ENCOUNTER — Ambulatory Visit: Payer: Medicare HMO | Admitting: Endocrinology

## 2017-12-17 DIAGNOSIS — Z0289 Encounter for other administrative examinations: Secondary | ICD-10-CM

## 2017-12-21 ENCOUNTER — Emergency Department (HOSPITAL_COMMUNITY)
Admission: EM | Admit: 2017-12-21 | Discharge: 2017-12-21 | Disposition: A | Discharge status: Home / Self Care | Payer: Medicare HMO | Attending: Emergency Medicine | Admitting: Emergency Medicine

## 2017-12-21 ENCOUNTER — Other Ambulatory Visit: Payer: Self-pay

## 2017-12-21 ENCOUNTER — Emergency Department (HOSPITAL_COMMUNITY): Payer: Medicare HMO

## 2017-12-21 DIAGNOSIS — I10 Essential (primary) hypertension: Secondary | ICD-10-CM | POA: Diagnosis not present

## 2017-12-21 DIAGNOSIS — L03113 Cellulitis of right upper limb: Secondary | ICD-10-CM | POA: Insufficient documentation

## 2017-12-21 DIAGNOSIS — E109 Type 1 diabetes mellitus without complications: Secondary | ICD-10-CM | POA: Insufficient documentation

## 2017-12-21 DIAGNOSIS — J449 Chronic obstructive pulmonary disease, unspecified: Secondary | ICD-10-CM | POA: Insufficient documentation

## 2017-12-21 DIAGNOSIS — Z794 Long term (current) use of insulin: Secondary | ICD-10-CM | POA: Insufficient documentation

## 2017-12-21 DIAGNOSIS — R42 Dizziness and giddiness: Secondary | ICD-10-CM | POA: Diagnosis not present

## 2017-12-21 DIAGNOSIS — F17210 Nicotine dependence, cigarettes, uncomplicated: Secondary | ICD-10-CM | POA: Diagnosis not present

## 2017-12-21 DIAGNOSIS — E1165 Type 2 diabetes mellitus with hyperglycemia: Secondary | ICD-10-CM | POA: Diagnosis not present

## 2017-12-21 LAB — URINALYSIS, ROUTINE W REFLEX MICROSCOPIC
BACTERIA UA: NONE SEEN
BILIRUBIN URINE: NEGATIVE
Glucose, UA: >=500 mg/dL — AB
HGB URINE DIPSTICK: NEGATIVE
KETONES UR: NEGATIVE mg/dL
LEUKOCYTES UA: NEGATIVE
NITRITE: NEGATIVE
Protein, ur: >=300 mg/dL — AB
SPECIFIC GRAVITY, URINE: 1.021 (ref 1.005–1.030)
pH: 6 (ref 5.0–8.0)

## 2017-12-21 LAB — BASIC METABOLIC PANEL
Anion gap: 11 (ref 5–15)
BUN: 25 mg/dL — AB (ref 8–23)
CALCIUM: 9 mg/dL (ref 8.9–10.3)
CHLORIDE: 101 mmol/L (ref 98–111)
CO2: 27 mmol/L (ref 22–32)
CREATININE: 1.54 mg/dL — AB (ref 0.61–1.24)
GFR calc non Af Amer: 47 mL/min — ABNORMAL LOW (ref >60)
GFR, EST AFRICAN AMERICAN: 54 mL/min — AB (ref >60)
Glucose, Bld: 211 mg/dL — ABNORMAL HIGH (ref 70–99)
Potassium: 4.1 mmol/L (ref 3.5–5.1)
SODIUM: 139 mmol/L (ref 135–145)

## 2017-12-21 LAB — CBC WITH DIFFERENTIAL/PLATELET
BASOS PCT: 1 %
Basophils Absolute: 0.1 10*3/uL (ref 0.0–0.1)
EOS ABS: 0.4 10*3/uL (ref 0.0–0.7)
Eosinophils Relative: 4 %
HCT: 35.3 % — ABNORMAL LOW (ref 39.0–52.0)
Hemoglobin: 12.1 g/dL — ABNORMAL LOW (ref 13.0–17.0)
LYMPHS ABS: 2 10*3/uL (ref 0.7–4.0)
Lymphocytes Relative: 22 %
MCH: 30.5 pg (ref 26.0–34.0)
MCHC: 34.3 g/dL (ref 30.0–36.0)
MCV: 88.9 fL (ref 78.0–100.0)
MONOS PCT: 8 %
Monocytes Absolute: 0.7 10*3/uL (ref 0.1–1.0)
Neutro Abs: 6 10*3/uL (ref 1.7–7.7)
Neutrophils Relative %: 65 %
Platelets: 377 10*3/uL (ref 150–400)
RBC: 3.97 MIL/uL — ABNORMAL LOW (ref 4.22–5.81)
RDW: 13.8 % (ref 11.5–15.5)
WBC: 9.1 10*3/uL (ref 4.0–10.5)

## 2017-12-21 LAB — TROPONIN I

## 2017-12-21 LAB — GLUCOSE, CAPILLARY: GLUCOSE-CAPILLARY: 204 mg/dL — AB (ref 70–99)

## 2017-12-21 MED ORDER — CEPHALEXIN 500 MG PO CAPS: 500.0000 mg | ORAL_CAPSULE | Freq: Two times a day (BID) | ORAL | 0 refills | Status: AC

## 2017-12-21 MED ORDER — SODIUM CHLORIDE 0.9 % IV BOLUS
1000.0000 mL | Freq: Once | INTRAVENOUS | Status: AC
  Administered 2017-12-21: 1000 mL via INTRAVENOUS

## 2017-12-21 MED ORDER — LACTATED RINGERS IV BOLUS
1000.0000 mL | Freq: Once | INTRAVENOUS | Status: AC
  Administered 2017-12-21: 1000 mL via INTRAVENOUS

## 2017-12-21 MED ORDER — CEPHALEXIN 500 MG PO CAPS
500.0000 mg | ORAL_CAPSULE | Freq: Once | ORAL | Status: AC
  Administered 2017-12-21: 500 mg via ORAL
  Filled 2017-12-21: qty 1

## 2017-12-21 NOTE — ED Triage Notes (Addendum)
Pt c/o vertigo and dizziness x 8 months. States he has shortness of breath and "lung throbbing" with deep breath x 5 years.

## 2017-12-21 NOTE — ED Notes (Signed)
Pt given urinal, pt aware that a urine specimen is needed.

## 2017-12-21 NOTE — Discharge Instructions (Signed)
If you develop worsening dizziness, fever, vomiting, shortness of breath, worsening skin infection to your arm, or any other new/concerning symptoms then return to the ER for evaluation.  Otherwise follow-up with your primary care physician.

## 2017-12-21 NOTE — ED Notes (Signed)
Urine culture sent down with UA. 

## 2017-12-21 NOTE — ED Notes (Signed)
Pt informed registration that he was going to get lunch.

## 2017-12-21 NOTE — ED Provider Notes (Signed)
[facility] DEPT Provider Note   CSN: [id number] Arrival date & time: 12/21/17  1639     History   Chief Complaint Chief Complaint  Patient presents with  . Dizziness  . Shortness of Breath    HPI [name] is a 62 y.o. male.  HPI  62 year old male with a history of type 1 diabetes, hepatitis C, and drug abuse presents with dizziness.  He states that the dizziness is been a problem for about 8 months.  Today however he felt like he was going to pass out multiple times.  He also gets this feeling on and off throughout this past 8 months.  He is also had a couple episodes of vomiting today.  When EMS picked him up his glucose was around 300.  Nothing specifically makes the dizziness better or worse.  It does not always occur with high or low blood sugar and sometimes his blood sugar is normal.  Patient denies any headache, chest pain, abdominal pain.  He states that he is been having some "lung pain" that he describes as left thoracic back pain for the last several months as well.  Comes and goes.  He also has had shortness of breath these past 3 months as well as a chronic cough from smoking.  Patient also states that he got sunburned about a week ago.  Most areas have improved except for his right forearm which has been draining clear fluid and pus and has remained red.  Past Medical History:  Diagnosis Date  . DEPRESSION   . DIABETES MELLITUS, TYPE I   . DRUG ABUSE    pt should have NO controlled substances rx'ed  . Glaucoma   . HEPATITIS C    chronic  . Hypertension 02/18/2011  . 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 Vitiligo     Patient Active Problem List   Diagnosis Date Noted  . IV drug abuse ([place]) 04/19/2017  . Acute encephalopathy 04/19/2017  . Hemangioma 10/02/2016  . Foreign body (FB) in soft tissue   . 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[place])   . Gangrene ([place])   . Type 1 diabetes mellitus with hyperosmolarity without  nonketotic hyperglycemic hyperosmolar coma ([place])   . Tobacco abuse   . Essential hypertension   . Diabetic infection of right foot ([place]) 07/22/2016  . Diabetic foot infection ([place]) 07/22/2016  . Chronic ulcer of heel, right, with fat layer exposed ([place]) 05/20/2016  . DKA, type 1 ([place]) 04/18/2016  . Acute kidney injury ([place]) 04/18/2016  . Hyponatremia 04/18/2016  . Severe protein-calorie malnutrition ([place]) 04/18/2016  . Elevated troponin 04/18/2016  . Poor dentition 07/12/2015  . Tobacco use 07/06/2013  . PAD (peripheral artery disease) ([place]) 04/29/2013  . COPD, mild ([place])   . 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 ([place]) 11/18/2011  . Diabetic neuropathy ([place]) 06/21/2010  . PROLIFERATIVE DIABETIC RETINOPATHY 06/21/2010  . SKIN TAG 01/30/2010  . Chronic hepatitis C ([place]) 11/22/2008  . VITILIGO 11/22/2008  . PROTEINURIA, MILD 11/22/2008  . Substance abuse ([place]) 04/23/2007  . DEPRESSION 11/11/2006    Past Surgical History:  Procedure Laterality Date  . ABDOMINAL AORTOGRAM W/LOWER EXTREMITY N/A 07/25/2016   Procedure: Abdominal Aortogram w/Bilateral Lower Extremity Runoff;  Surgeon: Conrad Fenton, MD;  Location: [facility];  Service: Cardiovascular;  Laterality: N/A;  . ABDOMINAL AORTOGRAM W/LOWER EXTREMITY N/A 12/24/2016   Procedure: ABDOMINAL AORTOGRAM W/LOWER EXTREMITY;  Surgeon: Serafina Mitchell, MD;  Location:  [facility];  Service: Cardiovascular;  Laterality: N/A;  . AMPUTATION TOE Right 07/26/2016   Procedure: AMPUTATION GREAT TOE;  Surgeon: Serafina Mitchell, MD;  Location: [facility];  Service: Vascular;  Laterality: Right;  . EYE SURGERY     retinal surgery x 2, right eye  . INSERTION OF ILIAC STENT Right 07/26/2016   Procedure: INSERTION OF RIGHT POPLITEAL STENT WITH BALLOON ANGIOPLASTY;  Surgeon: Serafina Mitchell, MD;  Location: [facility];  Service: Vascular;  Laterality: Right;  . LOWER EXTREMITY ANGIOGRAM Right 07/26/2016   Procedure: LOWER  EXTREMITY ANGIOGRAM;  Surgeon: Serafina Mitchell, MD;  Location: [facility];  Service: Vascular;  Laterality: Right;  . PERIPHERAL VASCULAR BALLOON ANGIOPLASTY Right 07/26/2016   Procedure: PERIPHERAL VASCULAR BALLOON ANGIOPLASTY  RIGHT ANTERIOR TIBEAL ARTERY AND RIGHT SUPERFICIAL FEMORAL ARTERY;  Surgeon: Serafina Mitchell, MD;  Location: [facility];  Service: Vascular;  Laterality: Right;  . PERIPHERAL VASCULAR INTERVENTION Right 12/24/2016   Procedure: PERIPHERAL VASCULAR INTERVENTION;  Surgeon: Serafina Mitchell, MD;  Location: [facility];  Service: Cardiovascular;  Laterality: Right;  lower extr        Home Medications    Prior to Admission medications   Medication Sig Start Date End Date Taking? Authorizing Provider  insulin NPH Human (NOVOLIN N) 100 UNIT/ML injection Inject 0.43 mLs (43 Units total) into the skin daily before breakfast. Patient taking differently: Inject 40 Units into the skin daily before breakfast.  07/21/17  Yes Renato Shin, MD  aspirin EC 81 MG EC tablet Take 1 tablet (81 mg total) by mouth daily. Patient not taking: Reported on 07/17/2017 07/30/16   Lavina Hamman, MD  cephALEXin (KEFLEX) 500 MG capsule Take 1 capsule (500 mg total) by mouth 2 (two) times daily for 7 days. 12/21/17 12/28/17  Sherwood Gambler, MD    Family History Family History  Problem Relation Age of Onset  . Diabetes Father   . Kidney disease Father   . Arthritis Mother   . Heart disease Mother        CAD    Social History Social History   Tobacco Use  . Smoking status: Current Every Day Smoker    Packs/day: 0.25    Types: Cigarettes  . Smokeless tobacco: Never Used  . Tobacco comment: cutting back  Substance Use Topics  . Alcohol use: No    Alcohol/week: 0.0 standard drinks    Comment: 08/14/14 none  . Drug use: Yes    Types: "Crack" cocaine, Marijuana, Heroin    Comment: s/p rehab x 6, occasional drug use (per pt last  use was 06/2015)     Allergies   Codeine   Review of  Systems Review of Systems  Constitutional: Negative for fever.  Respiratory: Positive for cough and shortness of breath.   Cardiovascular: Negative for chest pain.  Gastrointestinal: Positive for vomiting. Negative for abdominal pain.  Musculoskeletal: Positive for back pain.  Neurological: Positive for dizziness and light-headedness. Negative for syncope and headaches.  All other systems reviewed and are negative.    Physical Exam Updated Vital Signs BP (!) 143/59   Pulse 80   Temp 98.3 F (36.8 C) (Oral)   Resp 17   SpO2 99%   Physical Exam  Constitutional: He is oriented to person, place, and time. He appears well-developed and well-nourished.  Non-toxic appearance. He does not appear ill. No distress.  HENT:  Head: Normocephalic and atraumatic.  Right Ear: External ear normal.  Left Ear:  External ear normal.  Nose: Nose normal.  Eyes: Right eye exhibits no discharge. Left eye exhibits no discharge.  Neck: Neck supple.  Cardiovascular: Normal rate, regular rhythm and normal heart sounds.  Pulmonary/Chest: Effort normal and breath sounds normal.  Abdominal: Soft. There is no tenderness.  Musculoskeletal: He exhibits no edema.  Neurological: He is alert and oriented to person, place, and time.  CN 3-12 grossly intact. 5/5 strength in all 4 extremities. Grossly normal sensation. Normal finger to nose.   Skin: Skin is warm and dry. There is erythema.  See picture of right forearm. Large scab with surrounding erythema and mild warmth  Nursing note and vitals reviewed.      ED Treatments / Results  Labs (all labs ordered are listed, but only abnormal results are displayed) Labs Reviewed  URINALYSIS, ROUTINE W REFLEX MICROSCOPIC - Abnormal; Notable for the following components:      Result Value   Glucose, UA >=500 (*)    Protein, ur >=300 (*)    All other components within normal limits  BASIC METABOLIC PANEL - Abnormal; Notable for the following components:    Glucose, Bld 211 (*)    BUN 25 (*)    Creatinine, Ser 1.54 (*)    GFR calc non Af Amer 47 (*)    GFR calc Af Amer 54 (*)    All other components within normal limits  CBC WITH DIFFERENTIAL/PLATELET - Abnormal; Notable for the following components:   RBC 3.97 (*)    Hemoglobin 12.1 (*)    HCT 35.3 (*)    All other components within normal limits  GLUCOSE, CAPILLARY - Abnormal; Notable for the following components:   Glucose-Capillary 204 (*)    All other components within normal limits  TROPONIN I  CBG MONITORING, ED    EKG EKG Interpretation  Date/Time:  Sunday December 21 2017 17:01:25 EDT Ventricular Rate:  78 PR Interval:    QRS Duration: 94 QT Interval:  403 QTC Calculation: 459 R Axis:   -19 Text Interpretation:  Sinus rhythm Probable left atrial enlargement Borderline left axis deviation nonspecific T wave changes improved compared to Jan 2019 Confirmed by Sherwood Gambler [phone]) on 12/21/2017 7:21:32 PM   Radiology Dg Chest 2 View  Result Date: 12/21/2017 CLINICAL DATA:  Vertigo and dizziness for the past 8 months. Smoker. EXAM: CHEST - 2 VIEW COMPARISON:  04/18/2017. FINDINGS: Normal sized heart. Clear lungs. The lungs remain mildly hyperexpanded with mild diffuse peribronchial thickening. Mild thoracic spine degenerative changes. IMPRESSION: Stable mild changes of COPD and chronic bronchitis. No acute cardiopulmonary abnormality. Electronically Signed   By: Claudie Revering M.D.   On: 12/21/2017 17:35    Procedures Procedures (including critical care time)  Medications Ordered in ED Medications  sodium chloride 0.9 % bolus 1,000 mL (0 mLs Intravenous Stopped 12/21/17 2131)  lactated ringers bolus 1,000 mL (0 mLs Intravenous Stopped 12/21/17 2237)  cephALEXin (KEFLEX) capsule 500 mg (500 mg Oral Given 12/21/17 2237)     Initial Impression / Assessment and Plan / ED Course  I have reviewed the triage vital signs and the nursing notes.  Pertinent labs & imaging results  that were available during my care of the patient were reviewed by me and considered in my medical decision making (see chart for details).     Patient overall appears well.  His vital signs are stable besides some mild hypertension.  He is afebrile.  His right arm redness could be residual from the sunburn but  given his history of diabetes and the warmth in this area I will cover him for infection given length of time of symptoms.  However he does not appear septic or significantly ill.  He does have a mild bump in his creatinine but no significant renal failure.  He has been given IV fluids.  Dizziness is of unclear etiology.  He does not have any focal neuro deficits and I doubt acute CNS pathology.  The dizziness has been on and off for months and I think he can follow-up with his PCP.  Discussed return precautions.  Has hyperglycemia but no evidence of DKA.  Final Clinical Impressions(s) / ED Diagnoses   Final diagnoses:  Dizziness  Right forearm cellulitis    ED Discharge Orders         Ordered    cephALEXin (KEFLEX) 500 MG capsule  2 times daily     12/21/17 2148           Sherwood Gambler, MD 12/21/17 2328

## 2017-12-23 ENCOUNTER — Other Ambulatory Visit: Payer: Self-pay

## 2017-12-23 ENCOUNTER — Emergency Department (HOSPITAL_COMMUNITY): Payer: Medicare HMO

## 2017-12-23 ENCOUNTER — Observation Stay (HOSPITAL_COMMUNITY)
Admission: EM | Admit: 2017-12-23 | Discharge: 2017-12-24 | Disposition: A | Discharge status: Home / Self Care | Payer: Medicare HMO | Attending: Internal Medicine | Admitting: Internal Medicine

## 2017-12-23 DIAGNOSIS — J449 Chronic obstructive pulmonary disease, unspecified: Secondary | ICD-10-CM | POA: Insufficient documentation

## 2017-12-23 DIAGNOSIS — I739 Peripheral vascular disease, unspecified: Secondary | ICD-10-CM | POA: Diagnosis present

## 2017-12-23 DIAGNOSIS — Z89411 Acquired absence of right great toe: Secondary | ICD-10-CM | POA: Insufficient documentation

## 2017-12-23 DIAGNOSIS — Z794 Long term (current) use of insulin: Secondary | ICD-10-CM | POA: Insufficient documentation

## 2017-12-23 DIAGNOSIS — Z95820 Peripheral vascular angioplasty status with implants and grafts: Secondary | ICD-10-CM | POA: Insufficient documentation

## 2017-12-23 DIAGNOSIS — R110 Nausea: Secondary | ICD-10-CM | POA: Diagnosis not present

## 2017-12-23 DIAGNOSIS — R55 Syncope and collapse: Principal | ICD-10-CM | POA: Diagnosis present

## 2017-12-23 DIAGNOSIS — E1051 Type 1 diabetes mellitus with diabetic peripheral angiopathy without gangrene: Secondary | ICD-10-CM | POA: Insufficient documentation

## 2017-12-23 DIAGNOSIS — R42 Dizziness and giddiness: Secondary | ICD-10-CM | POA: Diagnosis not present

## 2017-12-23 DIAGNOSIS — E1040 Type 1 diabetes mellitus with diabetic neuropathy, unspecified: Secondary | ICD-10-CM | POA: Diagnosis not present

## 2017-12-23 DIAGNOSIS — E162 Hypoglycemia, unspecified: Secondary | ICD-10-CM

## 2017-12-23 DIAGNOSIS — E103599 Type 1 diabetes mellitus with proliferative diabetic retinopathy without macular edema, unspecified eye: Secondary | ICD-10-CM | POA: Insufficient documentation

## 2017-12-23 DIAGNOSIS — F17210 Nicotine dependence, cigarettes, uncomplicated: Secondary | ICD-10-CM | POA: Diagnosis not present

## 2017-12-23 DIAGNOSIS — R0902 Hypoxemia: Secondary | ICD-10-CM | POA: Diagnosis not present

## 2017-12-23 DIAGNOSIS — I10 Essential (primary) hypertension: Secondary | ICD-10-CM | POA: Diagnosis not present

## 2017-12-23 DIAGNOSIS — E10649 Type 1 diabetes mellitus with hypoglycemia without coma: Secondary | ICD-10-CM | POA: Diagnosis not present

## 2017-12-23 LAB — URINALYSIS, ROUTINE W REFLEX MICROSCOPIC
Bacteria, UA: NONE SEEN
Bilirubin Urine: NEGATIVE
Glucose, UA: >=500 mg/dL — AB
Hgb urine dipstick: NEGATIVE
KETONES UR: NEGATIVE mg/dL
Leukocytes, UA: NEGATIVE
Nitrite: NEGATIVE
PH: 6 (ref 5.0–8.0)
Protein, ur: >=300 mg/dL — AB
Specific Gravity, Urine: 1.02 (ref 1.005–1.030)

## 2017-12-23 LAB — I-STAT TROPONIN, ED: Troponin i, poc: 0.03 ng/mL (ref 0.00–0.08)

## 2017-12-23 LAB — CBC
HCT: 38.8 % — ABNORMAL LOW (ref 39.0–52.0)
Hemoglobin: 13.1 g/dL (ref 13.0–17.0)
MCH: 30.5 pg (ref 26.0–34.0)
MCHC: 33.8 g/dL (ref 30.0–36.0)
MCV: 90.2 fL (ref 78.0–100.0)
PLATELETS: 325 10*3/uL (ref 150–400)
RBC: 4.3 MIL/uL (ref 4.22–5.81)
RDW: 13.3 % (ref 11.5–15.5)
WBC: 9.4 10*3/uL (ref 4.0–10.5)

## 2017-12-23 LAB — COMPREHENSIVE METABOLIC PANEL
ALT: 14 U/L (ref 0–44)
AST: 17 U/L (ref 15–41)
Albumin: 3 g/dL — ABNORMAL LOW (ref 3.5–5.0)
Alkaline Phosphatase: 94 U/L (ref 38–126)
Anion gap: 9 (ref 5–15)
BUN: 18 mg/dL (ref 8–23)
CHLORIDE: 101 mmol/L (ref 98–111)
CO2: 27 mmol/L (ref 22–32)
Calcium: 9.2 mg/dL (ref 8.9–10.3)
Creatinine, Ser: 1.36 mg/dL — ABNORMAL HIGH (ref 0.61–1.24)
GFR calc Af Amer: >60 mL/min (ref >60)
GFR calc non Af Amer: 55 mL/min — ABNORMAL LOW (ref >60)
Glucose, Bld: 61 mg/dL — ABNORMAL LOW (ref 70–99)
POTASSIUM: 3.4 mmol/L — AB (ref 3.5–5.1)
SODIUM: 137 mmol/L (ref 135–145)
TOTAL PROTEIN: 6.5 g/dL (ref 6.5–8.1)
Total Bilirubin: 0.6 mg/dL (ref 0.3–1.2)

## 2017-12-23 LAB — MAGNESIUM: MAGNESIUM: 2 mg/dL (ref 1.7–2.4)

## 2017-12-23 LAB — LIPASE, BLOOD: LIPASE: 44 U/L (ref 11–51)

## 2017-12-23 LAB — CBG MONITORING, ED
GLUCOSE-CAPILLARY: 134 mg/dL — AB (ref 70–99)
Glucose-Capillary: 45 mg/dL — ABNORMAL LOW (ref 70–99)

## 2017-12-23 LAB — I-STAT CG4 LACTIC ACID, ED: LACTIC ACID, VENOUS: 1.38 mmol/L (ref 0.5–1.9)

## 2017-12-23 MED ORDER — DEXTROSE 50 % IV SOLN
INTRAVENOUS | Status: AC
  Filled 2017-12-23: qty 50

## 2017-12-23 MED ORDER — ALUM & MAG HYDROXIDE-SIMETH 200-200-20 MG/5ML PO SUSP
30.0000 mL | ORAL | Status: DC | PRN
  Filled 2017-12-23: qty 30

## 2017-12-23 MED ORDER — ONDANSETRON HCL 4 MG/2ML IJ SOLN: 4.0000 mg | Freq: Once | INTRAMUSCULAR | Status: DC

## 2017-12-23 MED ORDER — SODIUM CHLORIDE 0.9 % IV BOLUS
1000.0000 mL | Freq: Once | INTRAVENOUS | Status: AC
  Administered 2017-12-23: 1000 mL via INTRAVENOUS

## 2017-12-23 MED ORDER — DEXTROSE 50 % IV SOLN
1.0000 | Freq: Once | INTRAVENOUS | Status: AC
  Administered 2017-12-23: 50 mL via INTRAVENOUS

## 2017-12-23 MED ORDER — DOXYCYCLINE HYCLATE 100 MG PO TABS
100.0000 mg | ORAL_TABLET | Freq: Once | ORAL | Status: AC
  Administered 2017-12-23: 100 mg via ORAL
  Filled 2017-12-23: qty 1

## 2017-12-23 MED ORDER — DIPHENHYDRAMINE HCL 25 MG PO CAPS
25.0000 mg | ORAL_CAPSULE | Freq: Once | ORAL | Status: AC
  Administered 2017-12-23: 25 mg via ORAL
  Filled 2017-12-23: qty 1

## 2017-12-23 NOTE — ED Notes (Signed)
Patient transported to X-ray 

## 2017-12-23 NOTE — ED Provider Notes (Signed)
[facility] EMERGENCY DEPARTMENT Provider Note   CSN: [id number] Arrival date & time: 12/23/17  1827     History   Chief Complaint Chief Complaint  Patient presents with  . Loss of Consciousness    HPI [name] is a 62 y.o. male.  [name] is a 62 y.o. Male with a History of type 1 diabetes, hep C, hypertension, vitiligo, drug abuse and depression, who presents to the emergency department via EMS for evaluation after 2 syncopal episodes.  Patient reports he spent most of the day outside, he went to stand up and walk to the bathroom and had a syncopal episode, he reports he was able to catch himself and did not hit his head.  He reports about an hour later he had another syncopal episode.  He reports prodrome of hearing very lightheaded with some mild nausea, he does report 2 episodes of nonbloody emesis in between syncopal episodes.  He reports he has been having lots of intermittent episodes of lightheadedness recently, was seen at [facility] emergency department 2 days ago for similar, had reassuring lab evaluation at that time and was discharged home.  Patient reports he uses NPH insulin in about 10 hours after he takes it his blood sugar seems to bottom out and he has had to call EMS to his house on multiple occasions due to low blood sugar, and wonders if this is what is causing his syncope.  He does not have any test strips and is unable to afford them in order to monitor his blood sugar closely.  He is followed by Dr. Loanne Drilling with endocrinology.  He denies preceding chest pain or shortness of breath and has not had any since his syncopal episodes.  No abdominal pain.  No fevers or chills.  Patient was noted to have a colitis over his right forearm when seen in the ED 2 days ago, was prescribed Keflex but was unable to pick up this prescription.  He reports he is just been treating it like sunburn with aloe without improvement.  He has not had any fevers or chills.   Denies any other infectious symptoms, no cough, no rhinorrhea, no dysuria or urinary frequency.  Pt does have history of substance abuse but reports aside from smoking tobacco he does not drink or use any substances, and has been clean for several months.     Past Medical History:  Diagnosis Date  . DEPRESSION   . DIABETES MELLITUS, TYPE I   . DRUG ABUSE    pt should have NO controlled substances rx'ed  . Glaucoma   . HEPATITIS C    chronic  . Hypertension 02/18/2011  . 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 Vitiligo     Patient Active Problem List   Diagnosis Date Noted  . IV drug abuse ([place]) 04/19/2017  . Acute encephalopathy 04/19/2017  . Hemangioma 10/02/2016  . Foreign body (FB) in soft tissue   . 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[place])   . Gangrene ([place])   . Type 1 diabetes mellitus with hyperosmolarity without nonketotic hyperglycemic hyperosmolar coma ([place])   . Tobacco abuse   . Essential hypertension   . Diabetic infection of right foot ([place]) 07/22/2016  . Diabetic foot infection ([place]) 07/22/2016  . Chronic ulcer of heel, right, with fat layer exposed ([place]) 05/20/2016  . DKA, type 1 ([place]) 04/18/2016  . Acute kidney injury ([place]) 04/18/2016  . Hyponatremia 04/18/2016  . Severe protein-calorie malnutrition (  [place]) 04/18/2016  . Elevated troponin 04/18/2016  . Poor dentition 07/12/2015  . Tobacco use 07/06/2013  . PAD (peripheral artery disease) ([place]) 04/29/2013  . COPD, mild ([place])   . 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 ([place]) 11/18/2011  . Diabetic neuropathy ([place]) 06/21/2010  . PROLIFERATIVE DIABETIC RETINOPATHY 06/21/2010  . SKIN TAG 01/30/2010  . Chronic hepatitis C ([place]) 11/22/2008  . VITILIGO 11/22/2008  . PROTEINURIA, MILD 11/22/2008  . Substance abuse ([place]) 04/23/2007  . DEPRESSION 11/11/2006    Past Surgical History:  Procedure Laterality Date  . ABDOMINAL AORTOGRAM W/LOWER EXTREMITY N/A 07/25/2016   Procedure:  Abdominal Aortogram w/Bilateral Lower Extremity Runoff;  Surgeon: Conrad Flathead, MD;  Location: [facility];  Service: Cardiovascular;  Laterality: N/A;  . ABDOMINAL AORTOGRAM W/LOWER EXTREMITY N/A 12/24/2016   Procedure: ABDOMINAL AORTOGRAM W/LOWER EXTREMITY;  Surgeon: Serafina Mitchell, MD;  Location: [facility];  Service: Cardiovascular;  Laterality: N/A;  . AMPUTATION TOE Right 07/26/2016   Procedure: AMPUTATION GREAT TOE;  Surgeon: Serafina Mitchell, MD;  Location: [facility];  Service: Vascular;  Laterality: Right;  . EYE SURGERY     retinal surgery x 2, right eye  . INSERTION OF ILIAC STENT Right 07/26/2016   Procedure: INSERTION OF RIGHT POPLITEAL STENT WITH BALLOON ANGIOPLASTY;  Surgeon: Serafina Mitchell, MD;  Location: [facility];  Service: Vascular;  Laterality: Right;  . LOWER EXTREMITY ANGIOGRAM Right 07/26/2016   Procedure: LOWER EXTREMITY ANGIOGRAM;  Surgeon: Serafina Mitchell, MD;  Location: [facility];  Service: Vascular;  Laterality: Right;  . PERIPHERAL VASCULAR BALLOON ANGIOPLASTY Right 07/26/2016   Procedure: PERIPHERAL VASCULAR BALLOON ANGIOPLASTY  RIGHT ANTERIOR TIBEAL ARTERY AND RIGHT SUPERFICIAL FEMORAL ARTERY;  Surgeon: Serafina Mitchell, MD;  Location: [facility];  Service: Vascular;  Laterality: Right;  . PERIPHERAL VASCULAR INTERVENTION Right 12/24/2016   Procedure: PERIPHERAL VASCULAR INTERVENTION;  Surgeon: Serafina Mitchell, MD;  Location: [facility];  Service: Cardiovascular;  Laterality: Right;  lower extr        Home Medications    Prior to Admission medications   Medication Sig Start Date End Date Taking? Authorizing Provider  aspirin EC 81 MG EC tablet Take 1 tablet (81 mg total) by mouth daily. Patient not taking: Reported on 07/17/2017 07/30/16   Lavina Hamman, MD  cephALEXin (KEFLEX) 500 MG capsule Take 1 capsule (500 mg total) by mouth 2 (two) times daily for 7 days. 12/21/17 12/28/17  Sherwood Gambler, MD  insulin NPH Human (NOVOLIN N) 100 UNIT/ML injection Inject  0.43 mLs (43 Units total) into the skin daily before breakfast. Patient taking differently: Inject 40 Units into the skin daily before breakfast.  07/21/17   Renato Shin, MD    Family History Family History  Problem Relation Age of Onset  . Diabetes Father   . Kidney disease Father   . Arthritis Mother   . Heart disease Mother        CAD    Social History Social History   Tobacco Use  . Smoking status: Current Every Day Smoker    Packs/day: 0.25    Types: Cigarettes  . Smokeless tobacco: Never Used  . Tobacco comment: cutting back  Substance Use Topics  . Alcohol use: No    Alcohol/week: 0.0 standard drinks    Comment: 08/14/14 none  . Drug use: Yes    Types: "Crack" cocaine, Marijuana, Heroin    Comment: s/p rehab x 6, occasional drug use (per  pt last  use was 06/2015)     Allergies   Codeine   Review of Systems Review of Systems  Constitutional: Negative for chills and fever.  HENT: Negative.   Eyes: Negative for visual disturbance.  Respiratory: Negative for cough and shortness of breath.   Cardiovascular: Negative for chest pain.  Gastrointestinal: Positive for nausea and vomiting. Negative for abdominal pain, blood in stool, constipation and diarrhea.  Genitourinary: Negative for dysuria and frequency.  Musculoskeletal: Negative for arthralgias and myalgias.  Skin: Negative for color change and rash.  Neurological: Positive for syncope and light-headedness. Negative for weakness, numbness and headaches.     Physical Exam Updated Vital Signs SpO2 98%   Physical Exam  Constitutional: He is oriented to person, place, and time. He appears well-developed and well-nourished. No distress.  HENT:  Head: Normocephalic and atraumatic.  Mouth/Throat: Oropharynx is clear and moist.  Eyes: Pupils are equal, round, and reactive to light. EOM are normal. Right eye exhibits no discharge. Left eye exhibits no discharge.  Neck: Neck supple.  Cardiovascular: Normal rate,  regular rhythm, normal heart sounds and intact distal pulses.  Pulmonary/Chest: Effort normal and breath sounds normal. No respiratory distress.  Respirations equal and unlabored, patient able to speak in full sentences, lungs clear to auscultation bilaterally  Abdominal: Soft. Bowel sounds are normal. He exhibits no distension and no mass. There is no tenderness. There is no guarding.  Abdomen soft, nondistended, nontender to palpation in all quadrants without guarding or peritoneal signs  Neurological: He is alert and oriented to person, place, and time. Coordination normal.  Speech is clear, able to follow commands CN III-XII intact Normal strength in upper and lower extremities bilaterally including dorsiflexion and plantar flexion, strong and equal grip strength Sensation normal to light and sharp touch Moves extremities without ataxia, coordination intact  Skin: Skin is warm and dry. Capillary refill takes less than 2 seconds. He is not diaphoretic.  Right forearm with well-demarcated erythematous area with large area of scabbing, it is warm to the touch, there is no fluctuance or expressible drainage  Psychiatric: He has a normal mood and affect. His behavior is normal.  Nursing note and vitals reviewed.    ED Treatments / Results  Labs (all labs ordered are listed, but only abnormal results are displayed) Labs Reviewed  CBC - Abnormal; Notable for the following components:      Result Value   HCT 38.8 (*)    All other components within normal limits  URINALYSIS, ROUTINE W REFLEX MICROSCOPIC - Abnormal; Notable for the following components:   Glucose, UA >=500 (*)    Protein, ur >=300 (*)    All other components within normal limits  COMPREHENSIVE METABOLIC PANEL - Abnormal; Notable for the following components:   Potassium 3.4 (*)    Glucose, Bld 61 (*)    Creatinine, Ser 1.36 (*)    Albumin 3.0 (*)    GFR calc non Af Amer 55 (*)    All other components within normal  limits  CBG MONITORING, ED - Abnormal; Notable for the following components:   Glucose-Capillary 45 (*)    All other components within normal limits  CBG MONITORING, ED - Abnormal; Notable for the following components:   Glucose-Capillary 134 (*)    All other components within normal limits  LIPASE, BLOOD  MAGNESIUM  I-STAT TROPONIN, ED  I-STAT CG4 LACTIC ACID, ED  I-STAT CG4 LACTIC ACID, ED    EKG None  Radiology Dg Chest  2 View  Result Date: 12/23/2017 CLINICAL DATA:  Syncopal episode. EXAM: CHEST - 2 VIEW COMPARISON:  12/21/2017 FINDINGS: The cardiac silhouette, mediastinal and hilar contours are within normal limits and stable. The lungs are clear of an acute process. Mild hyperinflation. Remote healed left rib fractures are noted. IMPRESSION: No acute cardiopulmonary findings. Electronically Signed   By: Marijo Sanes M.D.   On: 12/23/2017 20:33    Procedures Procedures (including critical care time)  Medications Ordered in ED Medications  sodium chloride 0.9 % bolus 1,000 mL (1,000 mLs Intravenous New Bag/Given 12/23/17 1907)     Initial Impression / Assessment and Plan / ED Course  I have reviewed the triage vital signs and the nursing notes.  Pertinent labs & imaging results that were available during my care of the patient were reviewed by me and considered in my medical decision making (see chart for details).  Patient presents after 2 episodes of syncope.  He did not hit his head.  Reports he has had many episodes of lightheadedness recently and has had issues with hypoglycemia and wonders if this is contributing.  Prodrome of lightheadedness and nausea, but no chest pain or shortness of breath.  On arrival patient is hypertensive but vitals otherwise stable.  No focal infectious symptoms aside from cellulitis noted on the right forearm, which was noted during ED visit 2 days ago, but patient has not been able to afford antibiotics to start treatment.  Appears unchanged  compared to picture.  Lungs are clear, abdomen is benign.  No focal neurologic deficits.  EMS patient had CBG of 171, reports taking NPH insulin several hours ago.  Within 1 hour of arrival patient began feeling very lightheaded, staff checked CBG which was 45, an acute drop of greater than 100 and less than an hour.  Patient given 50 and a sandwich, with improvement of glucose to 134 and improvement in lightheadedness.  Observing this some concern that patient is having transient hypoglycemia causing syncopal episodes.  EKG without concerning ischemic changes and chest x-ray is clear.  Negative troponin.  Negative lactic acid.  No leukocytosis and normal hemoglobin, no acute electrolyte derangements, creatinine slightly elevated at 1.36.  Normal liver function, normal lipase.  Urinalysis with glucose and protein, no signs of infection.  Patient has cellulitis of the right forearm but there is no other infectious source potentially causing transient hypoglycemia will start patient on p.o. Doxycycline.  Given multiple syncopal episodes today with episode of hypoglycemia here in the ED without obvious cause, and patient reports similar episodes recently will admit for further evaluation.  [facility] hospitalist consulted for admission.  Spoke with Dr. Hal Hope who will see and admit the patient.  Final Clinical Impressions(s) / ED Diagnoses   Final diagnoses:  Syncope and collapse  Hypoglycemia    ED Discharge Orders    None       Jacqlyn Larsen, [place] 12/23/17 2333    Tegeler, Gwenyth Allegra, MD 12/24/17 [phone]

## 2017-12-23 NOTE — ED Notes (Signed)
CBG 45, reported to RN

## 2017-12-23 NOTE — ED Notes (Signed)
Pt returned from xray

## 2017-12-23 NOTE — ED Triage Notes (Signed)
Patient states that he reports being out in the sun all dday and not drinking much.  States that he passed out X 2.  Continues to C/O dizziness  Denies dyspnea and chest pain.

## 2017-12-24 ENCOUNTER — Encounter (HOSPITAL_COMMUNITY): Payer: Self-pay | Admitting: Internal Medicine

## 2017-12-24 DIAGNOSIS — E1051 Type 1 diabetes mellitus with diabetic peripheral angiopathy without gangrene: Secondary | ICD-10-CM | POA: Diagnosis not present

## 2017-12-24 DIAGNOSIS — E162 Hypoglycemia, unspecified: Secondary | ICD-10-CM | POA: Diagnosis not present

## 2017-12-24 DIAGNOSIS — E103599 Type 1 diabetes mellitus with proliferative diabetic retinopathy without macular edema, unspecified eye: Secondary | ICD-10-CM | POA: Diagnosis not present

## 2017-12-24 DIAGNOSIS — I10 Essential (primary) hypertension: Secondary | ICD-10-CM | POA: Diagnosis not present

## 2017-12-24 DIAGNOSIS — R55 Syncope and collapse: Secondary | ICD-10-CM | POA: Diagnosis present

## 2017-12-24 DIAGNOSIS — E10649 Type 1 diabetes mellitus with hypoglycemia without coma: Secondary | ICD-10-CM | POA: Diagnosis not present

## 2017-12-24 DIAGNOSIS — J449 Chronic obstructive pulmonary disease, unspecified: Secondary | ICD-10-CM | POA: Diagnosis not present

## 2017-12-24 DIAGNOSIS — E1040 Type 1 diabetes mellitus with diabetic neuropathy, unspecified: Secondary | ICD-10-CM | POA: Diagnosis not present

## 2017-12-24 DIAGNOSIS — Z794 Long term (current) use of insulin: Secondary | ICD-10-CM | POA: Diagnosis not present

## 2017-12-24 DIAGNOSIS — Z89411 Acquired absence of right great toe: Secondary | ICD-10-CM | POA: Diagnosis not present

## 2017-12-24 LAB — GLUCOSE, CAPILLARY
GLUCOSE-CAPILLARY: 85 mg/dL (ref 70–99)
GLUCOSE-CAPILLARY: 294 mg/dL — AB (ref 70–99)
GLUCOSE-CAPILLARY: 215 mg/dL — AB (ref 70–99)
GLUCOSE-CAPILLARY: 195 mg/dL — AB (ref 70–99)
Glucose-Capillary: 174 mg/dL — ABNORMAL HIGH (ref 70–99)
Glucose-Capillary: 387 mg/dL — ABNORMAL HIGH (ref 70–99)
Glucose-Capillary: 203 mg/dL — ABNORMAL HIGH (ref 70–99)

## 2017-12-24 LAB — TROPONIN I: TROPONIN I: 0.03 ng/mL — AB (ref <0.03)

## 2017-12-24 LAB — HEPATIC FUNCTION PANEL
ALK PHOS: 81 U/L (ref 38–126)
ALT: 13 U/L (ref 0–44)
AST: 17 U/L (ref 15–41)
Albumin: 2.6 g/dL — ABNORMAL LOW (ref 3.5–5.0)
BILIRUBIN TOTAL: 0.6 mg/dL (ref 0.3–1.2)
Total Protein: 6 g/dL — ABNORMAL LOW (ref 6.5–8.1)

## 2017-12-24 LAB — CBC WITH DIFFERENTIAL/PLATELET
ABS IMMATURE GRANULOCYTES: 0 10*3/uL (ref 0.0–0.1)
BASOS ABS: 0.1 10*3/uL (ref 0.0–0.1)
BASOS PCT: 1 %
EOS ABS: 0.4 10*3/uL (ref 0.0–0.7)
Eosinophils Relative: 4 %
HCT: 36.9 % — ABNORMAL LOW (ref 39.0–52.0)
Hemoglobin: 12.3 g/dL — ABNORMAL LOW (ref 13.0–17.0)
IMMATURE GRANULOCYTES: 0 %
Lymphocytes Relative: 25 %
Lymphs Abs: 2.7 10*3/uL (ref 0.7–4.0)
MCH: 29.9 pg (ref 26.0–34.0)
MCHC: 33.3 g/dL (ref 30.0–36.0)
MCV: 89.6 fL (ref 78.0–100.0)
Monocytes Absolute: 0.9 10*3/uL (ref 0.1–1.0)
Monocytes Relative: 8 %
NEUTROS ABS: 6.5 10*3/uL (ref 1.7–7.7)
NEUTROS PCT: 62 %
PLATELETS: 318 10*3/uL (ref 150–400)
RBC: 4.12 MIL/uL — AB (ref 4.22–5.81)
RDW: 13.3 % (ref 11.5–15.5)
WBC: 10.7 10*3/uL — AB (ref 4.0–10.5)

## 2017-12-24 LAB — RAPID URINE DRUG SCREEN, HOSP PERFORMED
AMPHETAMINES: NOT DETECTED
BENZODIAZEPINES: NOT DETECTED
Barbiturates: NOT DETECTED
Cocaine: NOT DETECTED
OPIATES: NOT DETECTED
Tetrahydrocannabinol: POSITIVE — AB

## 2017-12-24 LAB — BASIC METABOLIC PANEL
Anion gap: 8 (ref 5–15)
BUN: 14 mg/dL (ref 8–23)
CALCIUM: 8.7 mg/dL — AB (ref 8.9–10.3)
CHLORIDE: 106 mmol/L (ref 98–111)
CO2: 25 mmol/L (ref 22–32)
Creatinine, Ser: 1.11 mg/dL (ref 0.61–1.24)
GFR calc Af Amer: >60 mL/min (ref >60)
GFR calc non Af Amer: >60 mL/min (ref >60)
GLUCOSE: 87 mg/dL (ref 70–99)
Potassium: 3.5 mmol/L (ref 3.5–5.1)
Sodium: 139 mmol/L (ref 135–145)

## 2017-12-24 LAB — MAGNESIUM: Magnesium: 1.8 mg/dL (ref 1.7–2.4)

## 2017-12-24 LAB — HIV ANTIBODY (ROUTINE TESTING W REFLEX): HIV SCREEN 4TH GENERATION: NONREACTIVE

## 2017-12-24 LAB — HEMOGLOBIN A1C
Hgb A1c MFr Bld: 9.5 % — ABNORMAL HIGH (ref 4.8–5.6)
Mean Plasma Glucose: 225.95 mg/dL

## 2017-12-24 LAB — CBG MONITORING, ED: GLUCOSE-CAPILLARY: 150 mg/dL — AB (ref 70–99)

## 2017-12-24 LAB — TSH: TSH: 1.741 u[IU]/mL (ref 0.350–4.500)

## 2017-12-24 MED ORDER — ACETAMINOPHEN 325 MG PO TABS: 650.0000 mg | ORAL_TABLET | Freq: Four times a day (QID) | ORAL | Status: DC | PRN

## 2017-12-24 MED ORDER — INSULIN ASPART 100 UNIT/ML ~~LOC~~ SOLN
0.0000 [IU] | Freq: Three times a day (TID) | SUBCUTANEOUS | Status: DC
  Administered 2017-12-24: 9 [IU] via SUBCUTANEOUS
  Administered 2017-12-24: 2 [IU] via SUBCUTANEOUS

## 2017-12-24 MED ORDER — INSULIN NPH (HUMAN) (ISOPHANE) 100 UNIT/ML ~~LOC~~ SUSP
30.0000 [IU] | Freq: Every day | SUBCUTANEOUS | Status: DC
  Administered 2017-12-24: 30 [IU] via SUBCUTANEOUS
  Filled 2017-12-24: qty 10

## 2017-12-24 MED ORDER — DOXYCYCLINE HYCLATE 100 MG PO TABS
100.0000 mg | ORAL_TABLET | Freq: Two times a day (BID) | ORAL | Status: DC
  Administered 2017-12-24: 100 mg via ORAL
  Filled 2017-12-24: qty 1

## 2017-12-24 MED ORDER — ENOXAPARIN SODIUM 40 MG/0.4ML ~~LOC~~ SOLN: 40.0000 mg | SUBCUTANEOUS | Status: DC

## 2017-12-24 MED ORDER — INSULIN NPH (HUMAN) (ISOPHANE) 100 UNIT/ML ~~LOC~~ SUSP: 32.0000 [IU] | Freq: Every day | SUBCUTANEOUS | 11 refills | Status: DC

## 2017-12-24 MED ORDER — ACETAMINOPHEN 650 MG RE SUPP: 650.0000 mg | Freq: Four times a day (QID) | RECTAL | Status: DC | PRN

## 2017-12-24 MED ORDER — HYDRALAZINE HCL 20 MG/ML IJ SOLN: 5.0000 mg | INTRAMUSCULAR | Status: DC | PRN

## 2017-12-24 MED ORDER — BLOOD GLUCOSE MONITOR KIT: PACK | 0 refills | Status: DC

## 2017-12-24 NOTE — Progress Notes (Addendum)
Inpatient Diabetes Program Recommendations  AACE/ADA: New Consensus Statement on Inpatient Glycemic Control (2015)  Target Ranges:  Prepandial:   less than 140 mg/dL      Peak postprandial:   less than 180 mg/dL (1-2 hours)      Critically ill patients:  140 - 180 mg/dL   Spoke with patient about diabetes and home regimen for diabetes control. Patient reports that he is homeless and gets his insulin at [facility] when he gets his check in. He takes his NPH Daily and tries to "make it last." Patient reports reducing his dose himself to 39 units before d/c. Patient reports sleeping on the streets and has his belongings behind a mattress firm in a trash bag.   Ptatient would like assistance in finding shelters to stay at when discharged. Discussed with patient about his current A1c level 9.5% and discussed glucose and A1c goals. Patient reports his A1c use to be around 7%.   Patient reported being unhappy with not having insulin until lunchtime. Explained how we watch trends until they reach 180 and then we start long acting insulin due to the possible insulin adjustments he may need after coming in with hypoglycemia.  Consulted SW for resources at d/c. Patient verbalized understanding of information discussed and he states that he has no further questions at this time related to diabetes.  Agree with current insulin orders. Will watch trends while here to see what his insulin needs are.   Thanks,  Tama Headings RN, MSN, BC-ADM Inpatient Diabetes Coordinator Team Pager [phone] (8a-5p)

## 2017-12-24 NOTE — Plan of Care (Signed)

## 2017-12-24 NOTE — Discharge Instructions (Signed)
Follow with Primary MD [name], MD in 7 days   Get CBC, CMP, 2 view Chest X ray checked  by Primary MD next visit.    Activity: As tolerated with Full fall precautions use walker/cane & assistance as needed   Disposition Home    Diet: Heart Healthy , carb modified , with feeding assistance and aspiration precautions.  For Heart failure patients - Check your Weight same time everyday, if you gain over 2 pounds, or you develop in leg swelling, experience more shortness of breath or chest pain, call your Primary MD immediately. Follow Cardiac Low Salt Diet and 1.5 lit/day fluid restriction.   On your next visit with your primary care physician please Get Medicines reviewed and adjusted.   Please request your Prim.MD to go over all Hospital Tests and Procedure/Radiological results at the follow up, please get all Hospital records sent to your Prim MD by signing hospital release before you go home.   If you experience worsening of your admission symptoms, develop shortness of breath, life threatening emergency, suicidal or homicidal thoughts you must seek medical attention immediately by calling 911 or calling your MD immediately  if symptoms less severe.  You Must read complete instructions/literature along with all the possible adverse reactions/side effects for all the Medicines you take and that have been prescribed to you. Take any new Medicines after you have completely understood and accpet all the possible adverse reactions/side effects.   Do not drive, operating heavy machinery, perform activities at heights, swimming or participation in water activities or provide baby sitting services if your were admitted for syncope or siezures until you have seen by Primary MD or a Neurologist and advised to do so again.  Do not drive when taking Pain medications.    Do not take more than prescribed Pain, Sleep and Anxiety Medications  Special Instructions: If you have smoked or  chewed Tobacco  in the last 2 yrs please stop smoking, stop any regular Alcohol  and or any Recreational drug use.  Wear Seat belts while driving.   Please note  You were cared for by a hospitalist during your hospital stay. If you have any questions about your discharge medications or the care you received while you were in the hospital after you are discharged, you can call the unit and asked to speak with the hospitalist on call if the hospitalist that took care of you is not available. Once you are discharged, your primary care physician will handle any further medical issues. Please note that NO REFILLS for any discharge medications will be authorized once you are discharged, as it is imperative that you return to your primary care physician (or establish a relationship with a primary care physician if you do not have one) for your aftercare needs so that they can reassess your need for medications and monitor your lab values.

## 2017-12-24 NOTE — Progress Notes (Signed)
Chrissie Noa Tuazon to be D/C'd Home per MD order.  Discussed with the patient and all questions fully answered.  VSS, Skin clean, dry and intact without evidence of skin break down, no evidence of skin tears noted. IV catheter discontinued intact. Site without signs and symptoms of complications. Dressing and pressure applied.  An After Visit Summary was printed and given to the patient. Patient received prescription.  D/c education completed with patient/family including follow up instructions, medication list, d/c activities limitations if indicated, with other d/c instructions as indicated by MD - patient able to verbalize understanding, all questions fully answered.   Patient instructed to return to ED, call 911, or call MD for any changes in condition.   Patient escorted via [place], and D/C home via private auto.  Luci Bank 12/24/2017 6:47 PM

## 2017-12-24 NOTE — H&P (Signed)
History and Physical    [name] HUD:[id number] DOB: [birth year] DOA: 12/23/2017  PCP: [name], MD  Patient coming from: Home.  Chief Complaint: Loss of consciousness.  HPI: [name] is a 62 y.o. male with history of diabetes mellitus type 1, peripheral vascular disease status post stent placement in the right lower extremity, history of hepatitis C, drug abuse presents to the ER after patient has had passed out twice yesterday.  Patient states he was walking on the road when he initially passed out and regained consciousness by himself.  The patient knew he was going to pass out and was slowly to himself fall without hitting his head.  2 hours later he had another episode similarly.  During which she also had diaphoresis denies any chest pain shortness of breath nausea vomiting or diarrhea.  He has been having dizziness and had come to the ER 2 days ago.  Patient also has been having recurrent hypoglycemic episodes.  Denies taking any drugs recently.  Also noticed some redness in the right upper extremity which patient attributes to a sunburn.  ED Course: In the ER patient blood sugar dropped to 45 and was given D50.  Patient admitted for syncopal episode likely from hypoglycemia.  EKG is pending.  Patient appears nonfocal.  Review of Systems: As per HPI, rest all negative.   Past Medical History:  Diagnosis Date  . DEPRESSION   . DIABETES MELLITUS, TYPE I   . DRUG ABUSE    pt should have NO controlled substances rx'ed  . Glaucoma   . HEPATITIS C    chronic  . Hypertension 02/18/2011  . 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 Vitiligo     Past Surgical History:  Procedure Laterality Date  . ABDOMINAL AORTOGRAM W/LOWER EXTREMITY N/A 07/25/2016   Procedure: Abdominal Aortogram w/Bilateral Lower Extremity Runoff;  Surgeon: Conrad Woodlands, MD;  Location: [facility];  Service: Cardiovascular;  Laterality: N/A;  . ABDOMINAL AORTOGRAM W/LOWER EXTREMITY N/A  12/24/2016   Procedure: ABDOMINAL AORTOGRAM W/LOWER EXTREMITY;  Surgeon: Serafina Mitchell, MD;  Location: [facility];  Service: Cardiovascular;  Laterality: N/A;  . AMPUTATION TOE Right 07/26/2016   Procedure: AMPUTATION GREAT TOE;  Surgeon: Serafina Mitchell, MD;  Location: [facility];  Service: Vascular;  Laterality: Right;  . EYE SURGERY     retinal surgery x 2, right eye  . INSERTION OF ILIAC STENT Right 07/26/2016   Procedure: INSERTION OF RIGHT POPLITEAL STENT WITH BALLOON ANGIOPLASTY;  Surgeon: Serafina Mitchell, MD;  Location: [facility];  Service: Vascular;  Laterality: Right;  . LOWER EXTREMITY ANGIOGRAM Right 07/26/2016   Procedure: LOWER EXTREMITY ANGIOGRAM;  Surgeon: Serafina Mitchell, MD;  Location: [facility];  Service: Vascular;  Laterality: Right;  . PERIPHERAL VASCULAR BALLOON ANGIOPLASTY Right 07/26/2016   Procedure: PERIPHERAL VASCULAR BALLOON ANGIOPLASTY  RIGHT ANTERIOR TIBEAL ARTERY AND RIGHT SUPERFICIAL FEMORAL ARTERY;  Surgeon: Serafina Mitchell, MD;  Location: [facility];  Service: Vascular;  Laterality: Right;  . PERIPHERAL VASCULAR INTERVENTION Right 12/24/2016   Procedure: PERIPHERAL VASCULAR INTERVENTION;  Surgeon: Serafina Mitchell, MD;  Location: [facility];  Service: Cardiovascular;  Laterality: Right;  lower extr     reports that he has been smoking cigarettes. He has been smoking about 0.25 packs per day. He has never used smokeless tobacco. He reports that he has current or past drug history. Drugs: "Crack" cocaine, Marijuana, and Heroin. He reports that he does  not drink alcohol.  Allergies  Allergen Reactions  . Codeine Itching    Family History  Problem Relation Age of Onset  . Diabetes Father   . Kidney disease Father   . Arthritis Mother   . Heart disease Mother        CAD    Prior to Admission medications   Medication Sig Start Date End Date Taking? Authorizing Provider  OVER THE COUNTER MEDICATION Inject 40-50 Units into the skin See admin instructions. ReliOn  Humulin N- Inject 40-50 units into the skin in the morning at 8 AM before breakfast   Yes [provider]  aspirin EC 81 MG EC tablet Take 1 tablet (81 mg total) by mouth daily. Patient not taking: Reported on 12/23/2017 07/30/16   Lavina Hamman, MD  cephALEXin (KEFLEX) 500 MG capsule Take 1 capsule (500 mg total) by mouth 2 (two) times daily for 7 days. Patient not taking: Reported on 12/23/2017 12/21/17 12/28/17  Sherwood Gambler, MD  insulin NPH Human (NOVOLIN N) 100 UNIT/ML injection Inject 0.43 mLs (43 Units total) into the skin daily before breakfast. Patient not taking: Reported on 12/23/2017 07/21/17   Renato Shin, MD    Physical Exam: Vitals:   12/23/17 2345 12/24/17 0000 12/24/17 0026 12/24/17 0032  BP: (!) 174/81 (!) 159/77  (!) 187/79  Pulse: 78 77  76  Resp: (!) 21 11  17   Temp:    98 F (36.7 C)  TempSrc:    Oral  SpO2: 97% 97%  99%  Weight:   68.6 kg   Height:   6' (1.829 m)       Constitutional: Moderately built and nourished. Vitals:   12/23/17 2345 12/24/17 0000 12/24/17 0026 12/24/17 0032  BP: (!) 174/81 (!) 159/77  (!) 187/79  Pulse: 78 77  76  Resp: (!) 21 11  17   Temp:    98 F (36.7 C)  TempSrc:    Oral  SpO2: 97% 97%  99%  Weight:   68.6 kg   Height:   6' (1.829 m)    Eyes: Anicteric no pallor. ENMT: No discharge from the ears eyes nose or mouth. Neck: No mass palpated no neck rigidity no JVD appreciated. Respiratory: No rhonchi or crepitations. Cardiovascular: S1-S2 heard no murmurs appreciated. Abdomen: Soft nontender bowel sounds present. Musculoskeletal: Right upper extremity has skin changes which is coming erythematous. Skin: Right upper extremities erythematous skin changes on the forearm and elbow. Neurologic: Alert awake oriented to time place and person.  Moves all extremities. Psychiatric: Appears normal.  Normal affect.   Labs on Admission: I have personally reviewed following labs and imaging studies  CBC: Recent Labs  Lab  12/21/17 2009 12/23/17 1912  WBC 9.1 9.4  NEUTROABS 6.0  --   HGB 12.1* 13.1  HCT 35.3* 38.8*  MCV 88.9 90.2  PLT 377 767   Basic Metabolic Panel: Recent Labs  Lab 12/21/17 2009 12/23/17 1912  NA 139 137  K 4.1 3.4*  CL 101 101  CO2 27 27  GLUCOSE 211* 61*  BUN 25* 18  CREATININE 1.54* 1.36*  CALCIUM 9.0 9.2  MG  --  2.0   GFR: Estimated Creatinine Clearance: 55.3 mL/min (A) (by C-G formula based on SCr of 1.36 mg/dL (H)). Liver Function Tests: Recent Labs  Lab 12/23/17 1912  AST 17  ALT 14  ALKPHOS 94  BILITOT 0.6  PROT 6.5  ALBUMIN 3.0*   Recent Labs  Lab 12/23/17 1912  LIPASE 44  No results for input(s): AMMONIA in the last 168 hours. Coagulation Profile: No results for input(s): INR, PROTIME in the last 168 hours. Cardiac Enzymes: Recent Labs  Lab 12/21/17 2009  TROPONINI <0.03   BNP (last 3 results) No results for input(s): PROBNP in the last 8760 hours. HbA1C: No results for input(s): HGBA1C in the last 72 hours. CBG: Recent Labs  Lab 12/21/17 1952 12/23/17 1936 12/23/17 1957 12/24/17 0007  GLUCAP 204* 80* 134* 150*   Lipid Profile: No results for input(s): CHOL, HDL, LDLCALC, TRIG, CHOLHDL, LDLDIRECT in the last 72 hours. Thyroid Function Tests: No results for input(s): TSH, T4TOTAL, FREET4, T3FREE, THYROIDAB in the last 72 hours. Anemia Panel: No results for input(s): VITAMINB12, FOLATE, FERRITIN, TIBC, IRON, RETICCTPCT in the last 72 hours. Urine analysis:    Component Value Date/Time   COLORURINE YELLOW 12/23/2017 2052   APPEARANCEUR CLEAR 12/23/2017 2052   LABSPEC 1.020 12/23/2017 2052   PHURINE 6.0 12/23/2017 2052   GLUCOSEU >=500 (A) 12/23/2017 2052   GLUCOSEU 500 04/28/2012 1149   HGBUR NEGATIVE 12/23/2017 2052   BILIRUBINUR NEGATIVE 12/23/2017 2052   BILIRUBINUR neg 04/28/2012 1053   KETONESUR NEGATIVE 12/23/2017 2052   PROTEINUR >=300 (A) 12/23/2017 2052   UROBILINOGEN 4.0 (H) 11/16/2013 1646   NITRITE NEGATIVE  12/23/2017 2052   LEUKOCYTESUR NEGATIVE 12/23/2017 2052   Sepsis Labs: @LABRCNTIP (procalcitonin:4,lacticidven:4) )No results found for this or any previous visit (from the past 240 hour(s)).   Radiological Exams on Admission: Dg Chest 2 View  Result Date: 12/23/2017 CLINICAL DATA:  Syncopal episode. EXAM: CHEST - 2 VIEW COMPARISON:  12/21/2017 FINDINGS: The cardiac silhouette, mediastinal and hilar contours are within normal limits and stable. The lungs are clear of an acute process. Mild hyperinflation. Remote healed left rib fractures are noted. IMPRESSION: No acute cardiopulmonary findings. Electronically Signed   By: Marijo Sanes M.D.   On: 12/23/2017 20:33      Assessment/Plan Principal Problem:   Syncope and collapse Active Problems:   PAD (peripheral artery disease) ([name]   Essential hypertension   Hypoglycemia   Syncope    1. Syncope -with hypoglycemic episodes likely could be the cause.  For now I am holding outpatient NPH insulin check hemoglobin A1c and closely follow CBGs every 2 hours for now.  Patient likely need to decrease his NPH insulin dose may need to be on sliding scale coverage.  Patient denies having any vomiting or diarrhea or poor oral intake.  To rule out other differentials we will check orthostatics and EKG is pending.  Closely monitor in telemetry to rule out any arrhythmias.  2D echo done in January 2018 showed EF of 60 to 65%.  Check cardiac markers since patient did complain of some heartburn.   2. Diabetes mellitus type 1 with hypoglycemic episode for now holding insulin and closely follow CBGs every 2 hours and checking hemoglobin A1c.  See #1. 3. Elevated blood pressure with history of hypertension per the chart.  Closely follow blood pressure trends patient is not on any antihypertensives. 4. History of peripheral vascular disease status post stent placement. 5. History of drug abuse urine drug screen is pending.  Patient states he has not taken any  drugs for last few months. 6. Right upper extremity erythema for now kept patient on doxycycline.   DVT prophylaxis: Lovenox. Code Status: Full code. Family Communication: Discussed with patient. Disposition Plan: Home. Consults called: None. Admission status: Observation.   Rise Patience MD [name] Pager [phone].  If 7PM-7AM, please contact night-coverage [email or web address] Password [email or web address]  12/24/2017, 3:20 AM

## 2017-12-24 NOTE — Discharge Summary (Signed)
[name], is a 62 y.o. male  DOB 12/20/1955  MRN [id number].  Admission date:  12/23/2017  Admitting Physician  Rise Patience, MD  Discharge Date:  12/24/2017   Primary MD  [name], MD  Recommendations for primary care physician for things to follow:  - please check CBC, bmp during next visit.   Admission Diagnosis  Syncope and collapse [R55] Hypoglycemia [E16.2]   Discharge Diagnosis  Syncope and collapse [R55] Hypoglycemia [E16.2]    Principal Problem:   Syncope and collapse Active Problems:   PAD (peripheral artery disease) ([name]   Essential hypertension   Hypoglycemia   Syncope      Past Medical History:  Diagnosis Date  . DEPRESSION   . DIABETES MELLITUS, TYPE I   . DRUG ABUSE    pt should have NO controlled substances rx'ed  . Glaucoma   . HEPATITIS C    chronic  . Hypertension 02/18/2011  . 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 Vitiligo     Past Surgical History:  Procedure Laterality Date  . ABDOMINAL AORTOGRAM W/LOWER EXTREMITY N/A 07/25/2016   Procedure: Abdominal Aortogram w/Bilateral Lower Extremity Runoff;  Surgeon: Conrad Perry, MD;  Location: [facility];  Service: Cardiovascular;  Laterality: N/A;  . ABDOMINAL AORTOGRAM W/LOWER EXTREMITY N/A 12/24/2016   Procedure: ABDOMINAL AORTOGRAM W/LOWER EXTREMITY;  Surgeon: Serafina Mitchell, MD;  Location: [facility];  Service: Cardiovascular;  Laterality: N/A;  . AMPUTATION TOE Right 07/26/2016   Procedure: AMPUTATION GREAT TOE;  Surgeon: Serafina Mitchell, MD;  Location: [facility];  Service: Vascular;  Laterality: Right;  . EYE SURGERY     retinal surgery x 2, right eye  . INSERTION OF ILIAC STENT Right 07/26/2016   Procedure: INSERTION OF RIGHT POPLITEAL STENT WITH BALLOON ANGIOPLASTY;  Surgeon: Serafina Mitchell, MD;  Location: [facility];  Service: Vascular;  Laterality: Right;  . LOWER EXTREMITY  ANGIOGRAM Right 07/26/2016   Procedure: LOWER EXTREMITY ANGIOGRAM;  Surgeon: Serafina Mitchell, MD;  Location: [facility];  Service: Vascular;  Laterality: Right;  . PERIPHERAL VASCULAR BALLOON ANGIOPLASTY Right 07/26/2016   Procedure: PERIPHERAL VASCULAR BALLOON ANGIOPLASTY  RIGHT ANTERIOR TIBEAL ARTERY AND RIGHT SUPERFICIAL FEMORAL ARTERY;  Surgeon: Serafina Mitchell, MD;  Location: [facility];  Service: Vascular;  Laterality: Right;  . PERIPHERAL VASCULAR INTERVENTION Right 12/24/2016   Procedure: PERIPHERAL VASCULAR INTERVENTION;  Surgeon: Serafina Mitchell, MD;  Location: [facility];  Service: Cardiovascular;  Laterality: Right;  lower extr       History of present illness and  Hospital Course:     Kindly see H&P for history of present illness and admission details, please review complete Labs, Consult reports and Test reports for all details in brief  HPI  from the history and physical done on the day of admission  HPI: [name] is a 62 y.o. male with history of diabetes mellitus type 1, peripheral vascular disease status post stent placement in the right lower  extremity, history of hepatitis C, drug abuse presents to the ER after patient has had passed out twice yesterday.  Patient states he was walking on the road when he initially passed out and regained consciousness by himself.  The patient knew he was going to pass out and was slowly to himself fall without hitting his head.  2 hours later he had another episode similarly.  During which she also had diaphoresis denies any chest pain shortness of breath nausea vomiting or diarrhea.  He has been having dizziness and had come to the ER 2 days ago.  Patient also has been having recurrent hypoglycemic episodes.  Denies taking any drugs recently.  Also noticed some redness in the right upper extremity which patient attributes to a sunburn.  ED Course: In the ER patient blood sugar dropped to 45 and was given D50.  Patient admitted for syncopal  episode likely from hypoglycemia.  EKG is pending.  Patient appears nonfocal.   Hospital Course    Syncope  -Secondary to hypoglycemia, he was not orthostatic, monitored on telemetry, patient reports he does not have late time snack, his insulin NPH has been lowered from 43units to 32 units on discharge, and he was instructed to have a nighttime snack, as he reports most of his hypoglycemic episodes happening late at night or in early morning. -Recent had negative troponins,.  2D echo done in January 2018 showed EF of 60 to 65%.  Check cardiac markers since patient did complain of some heartburn.    Diabetes mellitus  -See above discussion  Elevated blood pressure with history of hypertension per the chart.  -He is not on any hypertensive medication, blood pressure is acceptable at time of discharge  History of peripheral vascular disease status post stent placement.  History of drug abuse urine drug screen is pending.  Patient states he has not taken any drugs for last few months.  He is positive for THC  Right upper extremity erythema for now continue with home dose Keflex  Discharge Condition:  stable   Follow UP  Follow-up Information    [name], MD Follow up in 1 week(s).   Specialty:  Internal Medicine Contact information: [place] [address] [phone]             Discharge Instructions  and  Discharge Medications     Discharge Instructions    Discharge instructions   Complete by:  As directed    Follow with Primary MD [name], MD in 7 days   Get CBC, CMP, 2 view Chest X ray checked  by Primary MD next visit.    Activity: As tolerated with Full fall precautions use walker/cane & assistance as needed   Disposition Home    Diet: Heart Healthy , carb modified , with feeding assistance and aspiration precautions.  For Heart failure patients - Check your Weight same time everyday, if you gain over 2 pounds, or you  develop in leg swelling, experience more shortness of breath or chest pain, call your Primary MD immediately. Follow Cardiac Low Salt Diet and 1.5 lit/day fluid restriction.   On your next visit with your primary care physician please Get Medicines reviewed and adjusted.   Please request your Prim.MD to go over all Hospital Tests and Procedure/Radiological results at the follow up, please get all Hospital records sent to your Prim MD by signing hospital release before you go home.   If you experience worsening of your  admission symptoms, develop shortness of breath, life threatening emergency, suicidal or homicidal thoughts you must seek medical attention immediately by calling 911 or calling your MD immediately  if symptoms less severe.  You Must read complete instructions/literature along with all the possible adverse reactions/side effects for all the Medicines you take and that have been prescribed to you. Take any new Medicines after you have completely understood and accpet all the possible adverse reactions/side effects.   Do not drive, operating heavy machinery, perform activities at heights, swimming or participation in water activities or provide baby sitting services if your were admitted for syncope or siezures until you have seen by Primary MD or a Neurologist and advised to do so again.  Do not drive when taking Pain medications.    Do not take more than prescribed Pain, Sleep and Anxiety Medications  Special Instructions: If you have smoked or chewed Tobacco  in the last 2 yrs please stop smoking, stop any regular Alcohol  and or any Recreational drug use.  Wear Seat belts while driving.   Please note  You were cared for by a hospitalist during your hospital stay. If you have any questions about your discharge medications or the care you received while you were in the hospital after you are discharged, you can call the unit and asked to speak with the hospitalist on call if  the hospitalist that took care of you is not available. Once you are discharged, your primary care physician will handle any further medical issues. Please note that NO REFILLS for any discharge medications will be authorized once you are discharged, as it is imperative that you return to your primary care physician (or establish a relationship with a primary care physician if you do not have one) for your aftercare needs so that they can reassess your need for medications and monitor your lab values.   Increase activity slowly   Complete by:  As directed      Allergies as of 12/24/2017      Reactions   Codeine Itching      Medication List    TAKE these medications   aspirin 81 MG EC tablet Take 1 tablet (81 mg total) by mouth daily.   cephALEXin 500 MG capsule Commonly known as:  KEFLEX Take 1 capsule (500 mg total) by mouth 2 (two) times daily for 7 days.   insulin NPH Human 100 UNIT/ML injection Commonly known as:  HUMULIN N,NOVOLIN N Inject 0.32 mLs (32 Units total) into the skin daily before breakfast. What changed:  how much to take   OVER THE COUNTER MEDICATION Inject 40-50 Units into the skin See admin instructions. ReliOn Humulin N- Inject 40-50 units into the skin in the morning at 8 AM before breakfast         Diet and Activity recommendation: See Discharge Instructions above   Consults obtained -  none   Major procedures and Radiology Reports - PLEASE review detailed and final reports for all details, in brief -     Dg Chest 2 View  Result Date: 12/23/2017 CLINICAL DATA:  Syncopal episode. EXAM: CHEST - 2 VIEW COMPARISON:  12/21/2017 FINDINGS: The cardiac silhouette, mediastinal and hilar contours are within normal limits and stable. The lungs are clear of an acute process. Mild hyperinflation. Remote healed left rib fractures are noted. IMPRESSION: No acute cardiopulmonary findings. Electronically Signed   By: Marijo Sanes M.D.   On: 12/23/2017 20:33   Dg  Chest 2 View  Result Date: 12/21/2017 CLINICAL DATA:  Vertigo and dizziness for the past 8 months. Smoker. EXAM: CHEST - 2 VIEW COMPARISON:  04/18/2017. FINDINGS: Normal sized heart. Clear lungs. The lungs remain mildly hyperexpanded with mild diffuse peribronchial thickening. Mild thoracic spine degenerative changes. IMPRESSION: Stable mild changes of COPD and chronic bronchitis. No acute cardiopulmonary abnormality. Electronically Signed   By: Claudie Revering M.D.   On: 12/21/2017 17:35    Micro Results    No results found for this or any previous visit (from the past 240 hour(s)).     Today   Subjective:   [name] today has no headache,no chest abdominal pain,no new weakness tingling or numbness, feels much better.  Objective:   Blood pressure (!) 177/74, pulse 75, temperature 97.6 F (36.4 C), temperature source Oral, resp. rate 18, height 6' (1.829 m), weight 68.6 kg, SpO2 100 %.   Intake/Output Summary (Last 24 hours) at 12/24/2017 1638 Last data filed at 12/24/2017 0509 Gross per 24 hour  Intake 1000 ml  Output 100 ml  Net 900 ml    Exam Awake Alert, Oriented x 3, No new F.N deficits, Normal affect Symmetrical Chest Dickard movement, Good air movement bilaterally, CTAB RRR,No Gallops,Rubs or new Murmurs, No Parasternal Heave +ve B.Sounds, Abd Soft, Non tender, No organomegaly appriciated, No rebound -guarding or rigidity.  Data Review   CBC w Diff:  Lab Results  Component Value Date   WBC 10.7 (H) 12/24/2017   HGB 12.3 (L) 12/24/2017   HCT 36.9 (L) 12/24/2017   PLT 318 12/24/2017   LYMPHOPCT 25 12/24/2017   MONOPCT 8 12/24/2017   EOSPCT 4 12/24/2017   BASOPCT 1 12/24/2017    CMP:  Lab Results  Component Value Date   NA 139 12/24/2017   K 3.5 12/24/2017   CL 106 12/24/2017   CO2 25 12/24/2017   BUN 14 12/24/2017   CREATININE 1.11 12/24/2017   CREATININE 1.17 04/29/2017   PROT 6.0 (L) 12/24/2017   ALBUMIN 2.6 (L) 12/24/2017   BILITOT 0.6 12/24/2017    ALKPHOS 81 12/24/2017   AST 17 12/24/2017   ALT 13 12/24/2017   ALT 26 08/28/2016  .   Total Time in preparing paper work, data evaluation and todays exam - 79 minutes  Phillips Climes M.D on 12/24/2017 at 4:38 PM  [name]   Office  [phone]

## 2017-12-24 NOTE — Progress Notes (Signed)
CSW received consult regarding homelessness. CSW spoke with patient. He stated that he left his roommate due to him being a drug user and now has been staying with his son or on the street. He states he receives $1200 from Disability (he was denied for Medicaid due to making too much money) but he states that is not enough to live in hotels every day. He reports that his brother is a Banker but they had a falling out. He states that his mother has dementia and lives in [place] and that his ex-wife killed herself. CSW encouraged patient to look for a new apartment or boarding house instead of staying on the street to improve his health. CSW provided housing resources and a bus pass.   CSW signing off.   Percell Locus Fabiola Mudgett LCSW [phone]

## 2018-01-05 DIAGNOSIS — Z720 Tobacco use: Secondary | ICD-10-CM | POA: Diagnosis not present

## 2018-01-05 DIAGNOSIS — L0390 Cellulitis, unspecified: Secondary | ICD-10-CM | POA: Diagnosis not present

## 2018-01-05 DIAGNOSIS — Z713 Dietary counseling and surveillance: Secondary | ICD-10-CM | POA: Diagnosis not present

## 2018-01-05 DIAGNOSIS — E1065 Type 1 diabetes mellitus with hyperglycemia: Secondary | ICD-10-CM | POA: Diagnosis not present

## 2018-01-20 ENCOUNTER — Emergency Department (HOSPITAL_COMMUNITY): Payer: Medicare HMO

## 2018-01-20 ENCOUNTER — Emergency Department (HOSPITAL_COMMUNITY)
Admission: EM | Admit: 2018-01-20 | Discharge: 2018-01-20 | Disposition: A | Discharge status: Home / Self Care | Payer: Medicare HMO | Attending: Emergency Medicine | Admitting: Emergency Medicine

## 2018-01-20 ENCOUNTER — Encounter (HOSPITAL_COMMUNITY): Payer: Self-pay

## 2018-01-20 ENCOUNTER — Other Ambulatory Visit: Payer: Self-pay

## 2018-01-20 DIAGNOSIS — Z7982 Long term (current) use of aspirin: Secondary | ICD-10-CM | POA: Insufficient documentation

## 2018-01-20 DIAGNOSIS — Z794 Long term (current) use of insulin: Secondary | ICD-10-CM | POA: Insufficient documentation

## 2018-01-20 DIAGNOSIS — M79605 Pain in left leg: Secondary | ICD-10-CM | POA: Diagnosis not present

## 2018-01-20 DIAGNOSIS — S8992XA Unspecified injury of left lower leg, initial encounter: Secondary | ICD-10-CM | POA: Diagnosis not present

## 2018-01-20 DIAGNOSIS — J449 Chronic obstructive pulmonary disease, unspecified: Secondary | ICD-10-CM | POA: Insufficient documentation

## 2018-01-20 DIAGNOSIS — Y999 Unspecified external cause status: Secondary | ICD-10-CM | POA: Diagnosis not present

## 2018-01-20 DIAGNOSIS — F17210 Nicotine dependence, cigarettes, uncomplicated: Secondary | ICD-10-CM | POA: Diagnosis not present

## 2018-01-20 DIAGNOSIS — M25572 Pain in left ankle and joints of left foot: Secondary | ICD-10-CM | POA: Diagnosis not present

## 2018-01-20 DIAGNOSIS — Z89411 Acquired absence of right great toe: Secondary | ICD-10-CM | POA: Diagnosis not present

## 2018-01-20 DIAGNOSIS — S8012XA Contusion of left lower leg, initial encounter: Secondary | ICD-10-CM | POA: Diagnosis not present

## 2018-01-20 DIAGNOSIS — R52 Pain, unspecified: Secondary | ICD-10-CM | POA: Diagnosis not present

## 2018-01-20 DIAGNOSIS — Y929 Unspecified place or not applicable: Secondary | ICD-10-CM | POA: Insufficient documentation

## 2018-01-20 DIAGNOSIS — S99912A Unspecified injury of left ankle, initial encounter: Secondary | ICD-10-CM | POA: Diagnosis not present

## 2018-01-20 DIAGNOSIS — E1029 Type 1 diabetes mellitus with other diabetic kidney complication: Secondary | ICD-10-CM | POA: Insufficient documentation

## 2018-01-20 DIAGNOSIS — M79662 Pain in left lower leg: Secondary | ICD-10-CM | POA: Diagnosis not present

## 2018-01-20 DIAGNOSIS — Y9301 Activity, walking, marching and hiking: Secondary | ICD-10-CM | POA: Insufficient documentation

## 2018-01-20 DIAGNOSIS — Z79899 Other long term (current) drug therapy: Secondary | ICD-10-CM | POA: Diagnosis not present

## 2018-01-20 DIAGNOSIS — I10 Essential (primary) hypertension: Secondary | ICD-10-CM | POA: Insufficient documentation

## 2018-01-20 DIAGNOSIS — W108XXA Fall (on) (from) other stairs and steps, initial encounter: Secondary | ICD-10-CM | POA: Insufficient documentation

## 2018-01-20 NOTE — ED Notes (Signed)
Pt provided crutches per his request

## 2018-01-20 NOTE — ED Triage Notes (Signed)
Pt BIBA from home. Pt had trip and fall down 4-5 steps. No LOC, did not hit head. Pt reports left lower leg pain. No deformities noted.

## 2018-01-20 NOTE — ED Notes (Signed)
Pt provided additional sandwich and blanket. Pt has had 2 sandwiches, ravioli,and multiple diet cokes.

## 2018-01-26 NOTE — ED Provider Notes (Signed)
[facility] DEPT Provider Note   CSN: [id number] Arrival date & time: 01/20/18  1223     History   Chief Complaint Chief Complaint  Patient presents with  . Fall  . Leg Pain    left    HPI [name] is a 61 y.o. male.  HPI   62 year old male with left lower extremity pain.  Patient fell earlier today.  He lost his balance coming down some steps-approximately 4-5 steps.  Struck his left lower extremity against the steps and on the ground.  He did not strike his head.  No headache, neck or acute back pain.  He can ambulate although with increased pain.  Past Medical History:  Diagnosis Date  . DEPRESSION   . DIABETES MELLITUS, TYPE I   . DRUG ABUSE    pt should have NO controlled substances rx'ed  . Glaucoma   . HEPATITIS C    chronic  . Hypertension 02/18/2011  . 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 Vitiligo     Patient Active Problem List   Diagnosis Date Noted  . Hypoglycemia 12/24/2017  . Syncope 12/24/2017  . Syncope and collapse 12/23/2017  . IV drug abuse ([place]) 04/19/2017  . Acute encephalopathy 04/19/2017  . Hemangioma 10/02/2016  . Foreign body (FB) in soft tissue   . 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[place])   . Gangrene ([place])   . Type 1 diabetes mellitus with hyperosmolarity without nonketotic hyperglycemic hyperosmolar coma ([place])   . Tobacco abuse   . Essential hypertension   . Diabetic infection of right foot ([place]) 07/22/2016  . Diabetic foot infection ([place]) 07/22/2016  . Chronic ulcer of heel, right, with fat layer exposed ([place]) 05/20/2016  . DKA, type 1 ([place]) 04/18/2016  . Acute kidney injury ([place]) 04/18/2016  . Hyponatremia 04/18/2016  . Severe protein-calorie malnutrition ([place]) 04/18/2016  . Elevated troponin 04/18/2016  . Poor dentition 07/12/2015  . Tobacco use 07/06/2013  . PAD (peripheral artery disease) ([place]) 04/29/2013  . COPD, mild ([place])   . 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 Diabetic neuropathy ([place]) 06/21/2010  . PROLIFERATIVE DIABETIC RETINOPATHY 06/21/2010  . SKIN TAG 01/30/2010  . Chronic hepatitis C ([place]) 11/22/2008  . VITILIGO 11/22/2008  . PROTEINURIA, MILD 11/22/2008  . Substance abuse ([place]) 04/23/2007  . DEPRESSION 11/11/2006    Past Surgical History:  Procedure Laterality Date  . ABDOMINAL AORTOGRAM W/LOWER EXTREMITY N/A 07/25/2016   Procedure: Abdominal Aortogram w/Bilateral Lower Extremity Runoff;  Surgeon: Conrad Gardiner, MD;  Location: [facility];  Service: Cardiovascular;  Laterality: N/A;  . ABDOMINAL AORTOGRAM W/LOWER EXTREMITY N/A 12/24/2016   Procedure: ABDOMINAL AORTOGRAM W/LOWER EXTREMITY;  Surgeon: Serafina Mitchell, MD;  Location: [facility];  Service: Cardiovascular;  Laterality: N/A;  . AMPUTATION TOE Right 07/26/2016   Procedure: AMPUTATION GREAT TOE;  Surgeon: Serafina Mitchell, MD;  Location: [facility];  Service: Vascular;  Laterality: Right;  . EYE SURGERY     retinal surgery x 2, right eye  . INSERTION OF ILIAC STENT Right 07/26/2016   Procedure: INSERTION OF RIGHT POPLITEAL STENT WITH BALLOON ANGIOPLASTY;  Surgeon: Serafina Mitchell, MD;  Location: [facility];  Service: Vascular;  Laterality: Right;  . LOWER EXTREMITY ANGIOGRAM Right 07/26/2016   Procedure: LOWER EXTREMITY ANGIOGRAM;  Surgeon: Serafina Mitchell, MD;  Location: [facility];  Service: Vascular;  Laterality: Right;  . PERIPHERAL VASCULAR BALLOON ANGIOPLASTY Right 07/26/2016  Procedure: PERIPHERAL VASCULAR BALLOON ANGIOPLASTY  RIGHT ANTERIOR TIBEAL ARTERY AND RIGHT SUPERFICIAL FEMORAL ARTERY;  Surgeon: Serafina Mitchell, MD;  Location: [facility];  Service: Vascular;  Laterality: Right;  . PERIPHERAL VASCULAR INTERVENTION Right 12/24/2016   Procedure: PERIPHERAL VASCULAR INTERVENTION;  Surgeon: Serafina Mitchell, MD;  Location: [facility];  Service: Cardiovascular;  Laterality: Right;  lower extr        Home Medications    Prior to  Admission medications   Medication Sig Start Date End Date Taking? Authorizing Provider  cephALEXin (KEFLEX) 500 MG capsule Take 500 mg by mouth 4 (four) times daily. 01/11/18  Yes [provider]  insulin NPH Human (NOVOLIN N) 100 UNIT/ML injection Inject 0.32 mLs (32 Units total) into the skin daily before breakfast. Patient taking differently: Inject 40 Units into the skin daily before breakfast.  12/24/17  Yes Elgergawy, Silver Huguenin, MD  aspirin EC 81 MG EC tablet Take 1 tablet (81 mg total) by mouth daily. Patient not taking: Reported on 12/23/2017 07/30/16   Lavina Hamman, MD  blood glucose meter kit and supplies KIT Dispense based on patient and insurance preference. Use up to four times daily as directed. (FOR ICD-9 250.00, 250.01). 12/24/17   Elgergawy, Silver Huguenin, MD    Family History Family History  Problem Relation Age of Onset  . Diabetes Father   . Kidney disease Father   . Arthritis Mother   . Heart disease Mother        CAD    Social History Social History   Tobacco Use  . Smoking status: Current Every Day Smoker    Packs/day: 1.00    Types: Cigarettes  . Smokeless tobacco: Never Used  Substance Use Topics  . Alcohol use: No    Alcohol/week: 0.0 standard drinks    Comment: 08/14/14 none  . Drug use: Yes    Types: "Crack" cocaine, Marijuana, Heroin    Comment: s/p rehab x 6, occasional drug use (per pt last  use was 06/2015)     Allergies   Codeine   Review of Systems Review of Systems All systems reviewed and negative, other than as noted in HPI.   Physical Exam Updated Vital Signs BP (!) 198/81   Pulse 87   Temp 98.1 F (36.7 C) (Oral)   Resp 16   Ht 6' (1.829 m)   Wt 77.1 kg   SpO2 98%   BMI 23.06 kg/m   Physical Exam  Constitutional: He appears well-developed and well-nourished. No distress.  HENT:  Head: Normocephalic and atraumatic.  Eyes: Conjunctivae are normal. Right eye exhibits no discharge. Left eye exhibits no discharge.    Neck: Neck supple.  Cardiovascular: Normal rate, regular rhythm and normal heart sounds. Exam reveals no gallop and no friction rub.  No murmur heard. Pulmonary/Chest: Effort normal and breath sounds normal. No respiratory distress.  Abdominal: Soft. He exhibits no distension. There is no tenderness.  Musculoskeletal: He exhibits tenderness. He exhibits no edema.  Mild tenderness to palpation of the left shin and ankle and foot.  No open injuries.  Can actively range the foot and ankle although with increased pain.  Neurovascular intact.  Neurological: He is alert.  Skin: Skin is warm and dry.  Psychiatric: He has a normal mood and affect. His behavior is normal. Thought content normal.  Nursing note and vitals reviewed.    ED Treatments / Results  Labs (all labs ordered are listed, but only abnormal results are displayed)  Labs Reviewed - No data to display  EKG None  Radiology No results found.   Dg Tibia/fibula Left  Result Date: 01/20/2018 CLINICAL DATA:  Left lower leg pain after fall down steps. EXAM: LEFT TIBIA AND FIBULA - 2 VIEW COMPARISON:  None. FINDINGS: There is no evidence of fracture or other focal bone lesions. Soft tissues are unremarkable. IMPRESSION: Negative. Electronically Signed   By: Marijo Conception, M.D.   On: 01/20/2018 15:02   Dg Ankle Complete Left  Result Date: 01/20/2018 CLINICAL DATA:  Left ankle pain after fall down steps. EXAM: LEFT ANKLE COMPLETE - 3+ VIEW COMPARISON:  None. FINDINGS: There is no evidence of fracture, dislocation, or joint effusion. There is no evidence of arthropathy or other focal bone abnormality. Soft tissues are unremarkable. IMPRESSION: Negative. Electronically Signed   By: Marijo Conception, M.D.   On: 01/20/2018 15:02    Procedures Procedures (including critical care time)  Medications Ordered in ED Medications - No data to display   Initial Impression / Assessment and Plan / ED Course  I have reviewed the triage vital  signs and the nursing notes.  Pertinent labs & imaging results that were available during my care of the patient were reviewed by me and considered in my medical decision making (see chart for details).     Left lower leg contusion.  Negative imaging.  Neurovascular intact.  Plan rice therapy.  PRN NSAIDs.  Provided with crutches per her request.  Final Clinical Impressions(s) / ED Diagnoses   Final diagnoses:  Contusion of multiple sites of left lower extremity, initial encounter    ED Discharge Orders    None       Virgel Manifold, MD 01/26/18 1450

## 2018-01-29 ENCOUNTER — Encounter (HOSPITAL_COMMUNITY): Payer: Self-pay | Admitting: Emergency Medicine

## 2018-01-29 ENCOUNTER — Emergency Department (HOSPITAL_COMMUNITY): Payer: Medicare HMO

## 2018-01-29 ENCOUNTER — Observation Stay (HOSPITAL_COMMUNITY)
Admission: EM | Admit: 2018-01-29 | Discharge: 2018-01-30 | Disposition: A | Discharge status: Home / Self Care | Payer: Medicare HMO | Attending: Internal Medicine | Admitting: Internal Medicine

## 2018-01-29 DIAGNOSIS — Z79899 Other long term (current) drug therapy: Secondary | ICD-10-CM | POA: Insufficient documentation

## 2018-01-29 DIAGNOSIS — I6523 Occlusion and stenosis of bilateral carotid arteries: Secondary | ICD-10-CM | POA: Diagnosis not present

## 2018-01-29 DIAGNOSIS — M25572 Pain in left ankle and joints of left foot: Secondary | ICD-10-CM | POA: Diagnosis not present

## 2018-01-29 DIAGNOSIS — Z89411 Acquired absence of right great toe: Secondary | ICD-10-CM | POA: Diagnosis not present

## 2018-01-29 DIAGNOSIS — G319 Degenerative disease of nervous system, unspecified: Secondary | ICD-10-CM | POA: Diagnosis not present

## 2018-01-29 DIAGNOSIS — R55 Syncope and collapse: Secondary | ICD-10-CM | POA: Diagnosis not present

## 2018-01-29 DIAGNOSIS — I5030 Unspecified diastolic (congestive) heart failure: Secondary | ICD-10-CM | POA: Diagnosis not present

## 2018-01-29 DIAGNOSIS — Z794 Long term (current) use of insulin: Secondary | ICD-10-CM | POA: Diagnosis not present

## 2018-01-29 DIAGNOSIS — E860 Dehydration: Secondary | ICD-10-CM | POA: Insufficient documentation

## 2018-01-29 DIAGNOSIS — E1040 Type 1 diabetes mellitus with diabetic neuropathy, unspecified: Secondary | ICD-10-CM | POA: Insufficient documentation

## 2018-01-29 DIAGNOSIS — I6782 Cerebral ischemia: Secondary | ICD-10-CM | POA: Insufficient documentation

## 2018-01-29 DIAGNOSIS — R05 Cough: Secondary | ICD-10-CM | POA: Diagnosis not present

## 2018-01-29 DIAGNOSIS — R300 Dysuria: Secondary | ICD-10-CM | POA: Diagnosis not present

## 2018-01-29 DIAGNOSIS — F1411 Cocaine abuse, in remission: Secondary | ICD-10-CM | POA: Diagnosis not present

## 2018-01-29 DIAGNOSIS — R296 Repeated falls: Secondary | ICD-10-CM | POA: Diagnosis not present

## 2018-01-29 DIAGNOSIS — Z95820 Peripheral vascular angioplasty status with implants and grafts: Secondary | ICD-10-CM | POA: Insufficient documentation

## 2018-01-29 DIAGNOSIS — B182 Chronic viral hepatitis C: Secondary | ICD-10-CM | POA: Insufficient documentation

## 2018-01-29 DIAGNOSIS — S81821A Laceration with foreign body, right lower leg, initial encounter: Secondary | ICD-10-CM | POA: Diagnosis not present

## 2018-01-29 DIAGNOSIS — E1065 Type 1 diabetes mellitus with hyperglycemia: Secondary | ICD-10-CM | POA: Diagnosis not present

## 2018-01-29 DIAGNOSIS — F1211 Cannabis abuse, in remission: Secondary | ICD-10-CM | POA: Diagnosis not present

## 2018-01-29 DIAGNOSIS — Z885 Allergy status to narcotic agent status: Secondary | ICD-10-CM | POA: Insufficient documentation

## 2018-01-29 DIAGNOSIS — F1010 Alcohol abuse, uncomplicated: Secondary | ICD-10-CM | POA: Diagnosis not present

## 2018-01-29 DIAGNOSIS — F1111 Opioid abuse, in remission: Secondary | ICD-10-CM | POA: Insufficient documentation

## 2018-01-29 DIAGNOSIS — Z9889 Other specified postprocedural states: Secondary | ICD-10-CM | POA: Insufficient documentation

## 2018-01-29 DIAGNOSIS — H409 Unspecified glaucoma: Secondary | ICD-10-CM | POA: Diagnosis not present

## 2018-01-29 DIAGNOSIS — Z8249 Family history of ischemic heart disease and other diseases of the circulatory system: Secondary | ICD-10-CM | POA: Insufficient documentation

## 2018-01-29 DIAGNOSIS — M25562 Pain in left knee: Secondary | ICD-10-CM | POA: Diagnosis not present

## 2018-01-29 DIAGNOSIS — Z7982 Long term (current) use of aspirin: Secondary | ICD-10-CM | POA: Insufficient documentation

## 2018-01-29 DIAGNOSIS — S8992XA Unspecified injury of left lower leg, initial encounter: Secondary | ICD-10-CM | POA: Diagnosis not present

## 2018-01-29 DIAGNOSIS — W19XXXA Unspecified fall, initial encounter: Secondary | ICD-10-CM | POA: Diagnosis not present

## 2018-01-29 DIAGNOSIS — F17210 Nicotine dependence, cigarettes, uncomplicated: Secondary | ICD-10-CM | POA: Insufficient documentation

## 2018-01-29 DIAGNOSIS — R739 Hyperglycemia, unspecified: Secondary | ICD-10-CM

## 2018-01-29 DIAGNOSIS — I10 Essential (primary) hypertension: Secondary | ICD-10-CM | POA: Diagnosis not present

## 2018-01-29 DIAGNOSIS — E1165 Type 2 diabetes mellitus with hyperglycemia: Secondary | ICD-10-CM | POA: Diagnosis not present

## 2018-01-29 DIAGNOSIS — Z833 Family history of diabetes mellitus: Secondary | ICD-10-CM | POA: Insufficient documentation

## 2018-01-29 DIAGNOSIS — I110 Hypertensive heart disease with heart failure: Secondary | ICD-10-CM | POA: Insufficient documentation

## 2018-01-29 LAB — URINALYSIS, ROUTINE W REFLEX MICROSCOPIC
BACTERIA UA: NONE SEEN
BILIRUBIN URINE: NEGATIVE
Glucose, UA: >=500 mg/dL — AB
Ketones, ur: 20 mg/dL — AB
Leukocytes, UA: NEGATIVE
Nitrite: NEGATIVE
PH: 6 (ref 5.0–8.0)
Protein, ur: 100 mg/dL — AB
Specific Gravity, Urine: 1.02 (ref 1.005–1.030)

## 2018-01-29 LAB — CBC WITH DIFFERENTIAL/PLATELET
ABS IMMATURE GRANULOCYTES: 0.04 10*3/uL (ref 0.00–0.07)
Basophils Absolute: 0.1 10*3/uL (ref 0.0–0.1)
Basophils Relative: 1 %
EOS ABS: 0.1 10*3/uL (ref 0.0–0.5)
Eosinophils Relative: 1 %
HEMATOCRIT: 40.4 % (ref 39.0–52.0)
Hemoglobin: 13.5 g/dL (ref 13.0–17.0)
Immature Granulocytes: 0 %
LYMPHS ABS: 1.1 10*3/uL (ref 0.7–4.0)
Lymphocytes Relative: 11 %
MCH: 30.3 pg (ref 26.0–34.0)
MCHC: 33.4 g/dL (ref 30.0–36.0)
MCV: 90.6 fL (ref 80.0–100.0)
MONO ABS: 0.6 10*3/uL (ref 0.1–1.0)
MONOS PCT: 7 %
NEUTROS ABS: 7.5 10*3/uL (ref 1.7–7.7)
Neutrophils Relative %: 80 %
PLATELETS: 350 10*3/uL (ref 150–400)
RBC: 4.46 MIL/uL (ref 4.22–5.81)
RDW: 13.2 % (ref 11.5–15.5)
WBC: 9.5 10*3/uL (ref 4.0–10.5)
nRBC: 0 % (ref 0.0–0.2)

## 2018-01-29 LAB — I-STAT CG4 LACTIC ACID, ED
LACTIC ACID, VENOUS: 1.69 mmol/L (ref 0.5–1.9)
Lactic Acid, Venous: 2.04 mmol/L (ref 0.5–1.9)

## 2018-01-29 LAB — I-STAT VENOUS BLOOD GAS, ED
Acid-Base Excess: 1 mmol/L (ref 0.0–2.0)
Bicarbonate: 25.7 mmol/L (ref 20.0–28.0)
O2 Saturation: 77 %
PCO2 VEN: 40.4 mmHg — AB (ref 44.0–60.0)
PH VEN: 7.411 (ref 7.250–7.430)
TCO2: 27 mmol/L (ref 22–32)
pO2, Ven: 41 mmHg (ref 32.0–45.0)

## 2018-01-29 LAB — COMPREHENSIVE METABOLIC PANEL
ALT: 22 U/L (ref 0–44)
AST: 30 U/L (ref 15–41)
Albumin: 2.9 g/dL — ABNORMAL LOW (ref 3.5–5.0)
Alkaline Phosphatase: 112 U/L (ref 38–126)
Anion gap: 12 (ref 5–15)
BUN: 30 mg/dL — ABNORMAL HIGH (ref 8–23)
CO2: 18 mmol/L — ABNORMAL LOW (ref 22–32)
Calcium: 9 mg/dL (ref 8.9–10.3)
Chloride: 99 mmol/L (ref 98–111)
Creatinine, Ser: 1.54 mg/dL — ABNORMAL HIGH (ref 0.61–1.24)
GFR calc Af Amer: 54 mL/min — ABNORMAL LOW (ref >60)
GFR calc non Af Amer: 47 mL/min — ABNORMAL LOW (ref >60)
Glucose, Bld: 681 mg/dL (ref 70–99)
Potassium: 5.5 mmol/L — ABNORMAL HIGH (ref 3.5–5.1)
Sodium: 129 mmol/L — ABNORMAL LOW (ref 135–145)
Total Bilirubin: 1.9 mg/dL — ABNORMAL HIGH (ref 0.3–1.2)
Total Protein: 6 g/dL — ABNORMAL LOW (ref 6.5–8.1)

## 2018-01-29 LAB — CBG MONITORING, ED
GLUCOSE-CAPILLARY: 429 mg/dL — AB (ref 70–99)
Glucose-Capillary: >600 mg/dL (ref 70–99)

## 2018-01-29 LAB — I-STAT TROPONIN, ED
TROPONIN I, POC: 0.01 ng/mL (ref 0.00–0.08)
Troponin i, poc: 0.01 ng/mL (ref 0.00–0.08)

## 2018-01-29 LAB — MAGNESIUM: MAGNESIUM: 2.2 mg/dL (ref 1.7–2.4)

## 2018-01-29 LAB — LIPASE, BLOOD: Lipase: 27 U/L (ref 11–51)

## 2018-01-29 MED ORDER — INSULIN REGULAR(HUMAN) IN NACL 100-0.9 UT/100ML-% IV SOLN
INTRAVENOUS | Status: DC
  Administered 2018-01-29: 100 [IU] via INTRAVENOUS
  Filled 2018-01-29: qty 100

## 2018-01-29 MED ORDER — DEXTROSE-NACL 5-0.45 % IV SOLN: INTRAVENOUS | Status: DC

## 2018-01-29 MED ORDER — ONDANSETRON HCL 4 MG/2ML IJ SOLN
4.0000 mg | Freq: Once | INTRAMUSCULAR | Status: AC
  Administered 2018-01-29: 4 mg via INTRAVENOUS
  Filled 2018-01-29: qty 2

## 2018-01-29 MED ORDER — SODIUM CHLORIDE 0.9 % IV BOLUS
1000.0000 mL | Freq: Once | INTRAVENOUS | Status: AC
  Administered 2018-01-29: 1000 mL via INTRAVENOUS

## 2018-01-29 MED ORDER — SODIUM CHLORIDE 0.9 % IV BOLUS
1000.0000 mL | Freq: Once | INTRAVENOUS | Status: AC
  Administered 2018-01-30: 1000 mL via INTRAVENOUS

## 2018-01-29 NOTE — ED Triage Notes (Signed)
Pt arrives via EMS from home with reports of diabetic complications. Pt reports having 4 syncopal episodes today and continues to have trouble with left leg since fall. Pt has dry mouth and frequent urination.

## 2018-01-29 NOTE — ED Provider Notes (Addendum)
[facility] EMERGENCY DEPARTMENT Provider Note   CSN: [id number] Arrival date & time: 01/29/18  1846     History   Chief Complaint Chief Complaint  Patient presents with  . Hyperglycemia  . Loss of Consciousness    HPI [name] is a 62 y.o. male.  The history is provided by the patient and medical records. No language interpreter was used.  Loss of Consciousness   This is a recurrent problem. The current episode started less than 1 hour ago. The problem occurs constantly. The problem has been resolved. He lost consciousness for a period of less than one minute. The problem is associated with standing up. Associated symptoms include abdominal pain, diaphoresis, light-headedness, malaise/fatigue, nausea and vomiting. Pertinent negatives include back pain, chest pain, confusion, congestion, dizziness, fever, headaches, vertigo and weakness. He has tried nothing for the symptoms. The treatment provided no relief. His past medical history is significant for DM.    Past Medical History:  Diagnosis Date  . DEPRESSION   . DIABETES MELLITUS, TYPE I   . DRUG ABUSE    pt should have NO controlled substances rx'ed  . Glaucoma   . HEPATITIS C    chronic  . Hypertension 02/18/2011  . 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 Vitiligo     Patient Active Problem List   Diagnosis Date Noted  . Hypoglycemia 12/24/2017  . Syncope 12/24/2017  . Syncope and collapse 12/23/2017  . IV drug abuse ([place]) 04/19/2017  . Acute encephalopathy 04/19/2017  . Hemangioma 10/02/2016  . Foreign body (FB) in soft tissue   . 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[place])   . Gangrene ([place])   . Type 1 diabetes mellitus with hyperosmolarity without nonketotic hyperglycemic hyperosmolar coma ([place])   . Tobacco abuse   . Essential hypertension   . Diabetic infection of right foot ([place]) 07/22/2016  . Diabetic foot infection ([place]) 07/22/2016  . Chronic ulcer of heel, right, with fat  layer exposed ([place]) 05/20/2016  . DKA, type 1 ([place]) 04/18/2016  . Acute kidney injury ([place]) 04/18/2016  . Hyponatremia 04/18/2016  . Severe protein-calorie malnutrition ([place]) 04/18/2016  . Elevated troponin 04/18/2016  . Poor dentition 07/12/2015  . Tobacco use 07/06/2013  . PAD (peripheral artery disease) ([place]) 04/29/2013  . COPD, mild ([place])   . 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 Diabetic neuropathy ([place]) 06/21/2010  . PROLIFERATIVE DIABETIC RETINOPATHY 06/21/2010  . SKIN TAG 01/30/2010  . Chronic hepatitis C ([place]) 11/22/2008  . VITILIGO 11/22/2008  . PROTEINURIA, MILD 11/22/2008  . Substance abuse ([place]) 04/23/2007  . DEPRESSION 11/11/2006    Past Surgical History:  Procedure Laterality Date  . ABDOMINAL AORTOGRAM W/LOWER EXTREMITY N/A 07/25/2016   Procedure: Abdominal Aortogram w/Bilateral Lower Extremity Runoff;  Surgeon: Conrad Gilson, MD;  Location: [facility];  Service: Cardiovascular;  Laterality: N/A;  . ABDOMINAL AORTOGRAM W/LOWER EXTREMITY N/A 12/24/2016   Procedure: ABDOMINAL AORTOGRAM W/LOWER EXTREMITY;  Surgeon: Serafina Mitchell, MD;  Location: [facility];  Service: Cardiovascular;  Laterality: N/A;  . AMPUTATION TOE Right 07/26/2016   Procedure: AMPUTATION GREAT TOE;  Surgeon: Serafina Mitchell, MD;  Location: [facility];  Service: Vascular;  Laterality: Right;  . EYE SURGERY     retinal surgery x 2, right eye  . INSERTION OF ILIAC STENT Right 07/26/2016   Procedure: INSERTION OF RIGHT POPLITEAL STENT WITH BALLOON ANGIOPLASTY;  Surgeon: Serafina Mitchell, MD;  Location:  [facility];  Service: Vascular;  Laterality: Right;  . LOWER EXTREMITY ANGIOGRAM Right 07/26/2016   Procedure: LOWER EXTREMITY ANGIOGRAM;  Surgeon: Serafina Mitchell, MD;  Location: [facility];  Service: Vascular;  Laterality: Right;  . PERIPHERAL VASCULAR BALLOON ANGIOPLASTY Right 07/26/2016   Procedure: PERIPHERAL VASCULAR BALLOON ANGIOPLASTY  RIGHT ANTERIOR  TIBEAL ARTERY AND RIGHT SUPERFICIAL FEMORAL ARTERY;  Surgeon: Serafina Mitchell, MD;  Location: [facility];  Service: Vascular;  Laterality: Right;  . PERIPHERAL VASCULAR INTERVENTION Right 12/24/2016   Procedure: PERIPHERAL VASCULAR INTERVENTION;  Surgeon: Serafina Mitchell, MD;  Location: [facility];  Service: Cardiovascular;  Laterality: Right;  lower extr        Home Medications    Prior to Admission medications   Medication Sig Start Date End Date Taking? Authorizing Provider  aspirin EC 81 MG EC tablet Take 1 tablet (81 mg total) by mouth daily. Patient not taking: Reported on 12/23/2017 07/30/16   Lavina Hamman, MD  blood glucose meter kit and supplies KIT Dispense based on patient and insurance preference. Use up to four times daily as directed. (FOR ICD-9 250.00, 250.01). 12/24/17   Elgergawy, Silver Huguenin, MD  cephALEXin (KEFLEX) 500 MG capsule Take 500 mg by mouth 4 (four) times daily. 01/11/18   [provider]  insulin NPH Human (NOVOLIN N) 100 UNIT/ML injection Inject 0.32 mLs (32 Units total) into the skin daily before breakfast. Patient taking differently: Inject 40 Units into the skin daily before breakfast.  12/24/17   Elgergawy, Silver Huguenin, MD    Family History Family History  Problem Relation Age of Onset  . Diabetes Father   . Kidney disease Father   . Arthritis Mother   . Heart disease Mother        CAD    Social History Social History   Tobacco Use  . Smoking status: Current Every Day Smoker    Packs/day: 1.00    Types: Cigarettes  . Smokeless tobacco: Never Used  Substance Use Topics  . Alcohol use: No    Alcohol/week: 0.0 standard drinks    Comment: 08/14/14 none  . Drug use: Yes    Types: "Crack" cocaine, Marijuana, Heroin    Comment: s/p rehab x 6, occasional drug use (per pt last  use was 06/2015)     Allergies   Codeine   Review of Systems Review of Systems  Constitutional: Positive for diaphoresis, fatigue and malaise/fatigue. Negative  for chills and fever.  HENT: Negative for congestion and rhinorrhea.   Eyes: Negative for visual disturbance.  Respiratory: Positive for cough. Negative for chest tightness, shortness of breath and wheezing.   Cardiovascular: Positive for syncope. Negative for chest pain.  Gastrointestinal: Positive for abdominal pain, nausea and vomiting. Negative for constipation and diarrhea.  Genitourinary: Positive for dysuria. Negative for flank pain.  Musculoskeletal: Negative for back pain, neck pain and neck stiffness.  Skin: Negative for rash and wound.  Neurological: Positive for light-headedness. Negative for dizziness, vertigo, weakness, numbness and headaches.  Psychiatric/Behavioral: Negative for agitation and confusion.     Physical Exam Updated Vital Signs BP (!) 203/105   Pulse 91   Resp 12   SpO2 99%   Physical Exam  Constitutional: He is oriented to person, place, and time. He appears well-developed and well-nourished. No distress.  HENT:  Head: Normocephalic and atraumatic.  Mouth/Throat: Oropharynx is clear and moist. No oropharyngeal exudate.  Eyes: Pupils are equal, round, and reactive to light. Conjunctivae and EOM are  normal.  Neck: Normal range of motion. Neck supple.  Cardiovascular: Normal rate and regular rhythm.  No murmur heard. Pulmonary/Chest: Effort normal and breath sounds normal. No respiratory distress. He has no wheezes. He has no rales. He exhibits no tenderness.  Abdominal: Soft. There is no tenderness.  Musculoskeletal: He exhibits no edema or tenderness.  Neurological: He is alert and oriented to person, place, and time. No cranial nerve deficit or sensory deficit. He exhibits normal muscle tone.  Skin: Skin is warm and dry. Capillary refill takes less than 2 seconds. No rash noted. He is not diaphoretic. No erythema. No pallor.  Psychiatric: He has a normal mood and affect.  Nursing note and vitals reviewed.    ED Treatments / Results  Labs (all  labs ordered are listed, but only abnormal results are displayed) Labs Reviewed  COMPREHENSIVE METABOLIC PANEL - Abnormal; Notable for the following components:      Result Value   Sodium 129 (*)    Potassium 5.5 (*)    CO2 18 (*)    Glucose, Bld 681 (*)    BUN 30 (*)    Creatinine, Ser 1.54 (*)    Total Protein 6.0 (*)    Albumin 2.9 (*)    Total Bilirubin 1.9 (*)    GFR calc non Af Amer 47 (*)    GFR calc Af Amer 54 (*)    All other components within normal limits  URINALYSIS, ROUTINE W REFLEX MICROSCOPIC - Abnormal; Notable for the following components:   Color, Urine STRAW (*)    Glucose, UA >=500 (*)    Hgb urine dipstick SMALL (*)    Ketones, ur 20 (*)    Protein, ur 100 (*)    All other components within normal limits  CBG MONITORING, ED - Abnormal; Notable for the following components:   Glucose-Capillary >600 (*)    All other components within normal limits  I-STAT CG4 LACTIC ACID, ED - Abnormal; Notable for the following components:   Lactic Acid, Venous 2.04 (*)    All other components within normal limits  I-STAT VENOUS BLOOD GAS, ED - Abnormal; Notable for the following components:   pCO2, Ven 40.4 (*)    All other components within normal limits  CBG MONITORING, ED - Abnormal; Notable for the following components:   Glucose-Capillary 429 (*)    All other components within normal limits  CBG MONITORING, ED - Abnormal; Notable for the following components:   Glucose-Capillary 399 (*)    All other components within normal limits  URINE CULTURE  CBC WITH DIFFERENTIAL/PLATELET  LIPASE, BLOOD  MAGNESIUM  BLOOD GAS, VENOUS  I-STAT TROPONIN, ED  I-STAT CG4 LACTIC ACID, ED  I-STAT TROPONIN, ED    EKG EKG Interpretation  Date/Time:  Thursday January 29 2018 19:36:55 EDT Ventricular Rate:  87 PR Interval:    QRS Duration: 101 QT Interval:  385 QTC Calculation: 464 R Axis:   -30 Text Interpretation:  Sinus rhythm Right atrial enlargement Left axis deviation  Anterior infarct, old When compared to prior, no significant changes seen.  No STEMI Confirmed by Antony Blackbird [phone] on 01/29/2018 7:52:29 PM   Radiology Dg Chest 2 View  Result Date: 01/29/2018 CLINICAL DATA:  Cough.  Syncope.  Smoker. EXAM: CHEST - 2 VIEW COMPARISON:  12/23/2017 FINDINGS: The heart size and mediastinal contours are within normal limits. Both lungs are clear. Several old left rib fracture deformities are again noted. IMPRESSION: No active cardiopulmonary disease. Electronically Signed  By: Earle Gell M.D.   On: 01/29/2018 20:17   Dg Ankle Complete Left  Result Date: 01/29/2018 CLINICAL DATA:  Left ankle pain. EXAM: LEFT ANKLE COMPLETE - 3+ VIEW COMPARISON:  01/20/2018 FINDINGS: There is no evidence of fracture, dislocation, or joint effusion. There is no evidence of arthropathy or other focal bone abnormality. Extensive peripheral vascular calcification noted. IMPRESSION: No acute findings. Electronically Signed   By: Earle Gell M.D.   On: 01/29/2018 20:19   Dg Knee Complete 4 Views Left  Result Date: 01/29/2018 CLINICAL DATA:  Left knee pain. EXAM: LEFT KNEE - COMPLETE 4+ VIEW COMPARISON:  None. FINDINGS: No evidence of fracture, dislocation, or joint effusion. No evidence of arthropathy or other focal bone abnormality. Extensive peripheral vascular calcification noted. IMPRESSION: No acute findings. Electronically Signed   By: Earle Gell M.D.   On: 01/29/2018 20:18    Procedures Procedures (including critical care time)  CRITICAL CARE Performed by: Gwenyth Allegra Tegeler Total critical care time: 35 minutes Critical care time was exclusive of separately billable procedures and treating other patients. Critical care was necessary to treat or prevent imminent or life-threatening deterioration. Critical care was time spent personally by me on the following activities: development of treatment plan with patient and/or surrogate as well as nursing, discussions with  consultants, evaluation of patient's response to treatment, examination of patient, obtaining history from patient or surrogate, ordering and performing treatments and interventions, ordering and review of laboratory studies, ordering and review of radiographic studies, pulse oximetry and re-evaluation of patient's condition.   Medications Ordered in ED Medications  insulin regular, human (MYXREDLIN) 100 units/ 100 mL infusion ( Intravenous Rate/Dose Verify 01/30/18 0009)  dextrose 5 %-0.45 % sodium chloride infusion (has no administration in time range)  sodium chloride 0.9 % bolus 1,000 mL (has no administration in time range)  sodium chloride 0.9 % bolus 1,000 mL (0 mLs Intravenous Stopped 01/29/18 2238)  ondansetron (ZOFRAN) injection 4 mg (4 mg Intravenous Given 01/29/18 2312)     Initial Impression / Assessment and Plan / ED Course  I have reviewed the triage vital signs and the nursing notes.  Pertinent labs & imaging results that were available during my care of the patient were reviewed by me and considered in my medical decision making (see chart for details).     [name] is a 62 y.o. male with a past medical history significant for hepatitis C, diabetes with history of DKA, COPD, peripheral arterial disease, depression, and substance abuse who presents with 4 episodes of syncope, nausea, vomiting, decreased oral intake, polyuria, and hyperglycemia.  Patient says that for the last few days he has been having worsening symptoms of fatigue, lightheadedness, nausea, occasional vomiting, and not keeping fluids down.  He says he took his glucose and it was high.  He is concerned he may have DKA again.  He reports associated dysuria.  He reports no significant congestion or cough.  He denies chest pain, shortness of breath but reports some abdominal cramping with the nausea.  On exam, patient had nausea and vomiting.  Patient was tachycardic on arrival.  Lungs were clear and chest was  nontender.  Abdomen was nontender.  Minimal edema in legs.    Greatest concern is the patient is in DKA possibly caused by a UTI given his dysuria.  I am also concerned given the patient's reported for syncopal episodes in the last 2 days.  He says he has had syncopal episodes in the  setting of glucose fluctuations in the past.  Previously admitted the patient for hypoglycemia with syncope.  Initial metabolic panel revealed a glucose of 681.  Patient's CO2 was low at 18.  Initial venous gas showed a pH of 7.4, do not feel he is completely in DKA however I am concerned about his hyperglycemia contributing to symptoms.  Patient's lipase was not elevated.  Troponin was negative.  Lactic acid initially elevated but improved with some fluids.  No leukocytosis or anemia.  Also did not show a infection but did show ketones.  I am still concerned the patient is in ketosis with elevated creatinine of 1.54.    Patient will be started on glucose stabilizer to help with his significant hyperglycemia.  Patient still unable to tolerate p.o. due to the nausea and vomiting.  Patient also reports he had a fall last week with pain persistent in his left knee and left ankle.  Patient will have repeat x-rays to look for occult fractures from before.  X-ray showed no fracture or dislocations.  Chest x-ray shows no pneumonia.  Patient persistently is having nausea and vomiting despite Zofran and fluids.  I am concerned about the patient maintaining his hydration with the dehydration and elevated glucose.  Unclear etiology of his hyperglycemia and I am also concerned about the syncopal episodes.  Due to patient's inability to maintain hydration and his multiple syncopal episodes, patient will be admitted for syncope and hyperglycemia of unclear etiology.   Final Clinical Impressions(s) / ED Diagnoses   Final diagnoses:  Syncope, unspecified syncope type  Dehydration  Hyperglycemia    ED Discharge Orders    None        Clinical Impression: 1. Syncope, unspecified syncope type   2. Dehydration   3. Hyperglycemia     Disposition: Admit  This note was prepared with assistance of Dragon voice recognition software. Occasional wrong-word or sound-a-like substitutions may have occurred due to the inherent limitations of voice recognition software.     Tegeler, Gwenyth Allegra, MD 01/30/18 0144    Tegeler, Gwenyth Allegra, MD 02/09/18 [phone]

## 2018-01-29 NOTE — ED Notes (Signed)
Patient transported to X-ray 

## 2018-01-30 ENCOUNTER — Observation Stay (HOSPITAL_BASED_OUTPATIENT_CLINIC_OR_DEPARTMENT_OTHER): Payer: Medicare HMO

## 2018-01-30 ENCOUNTER — Encounter (HOSPITAL_COMMUNITY): Payer: Self-pay

## 2018-01-30 ENCOUNTER — Observation Stay (HOSPITAL_COMMUNITY): Payer: Medicare HMO

## 2018-01-30 ENCOUNTER — Other Ambulatory Visit: Payer: Self-pay

## 2018-01-30 DIAGNOSIS — Z794 Long term (current) use of insulin: Secondary | ICD-10-CM

## 2018-01-30 DIAGNOSIS — E1110 Type 2 diabetes mellitus with ketoacidosis without coma: Secondary | ICD-10-CM

## 2018-01-30 DIAGNOSIS — R55 Syncope and collapse: Secondary | ICD-10-CM

## 2018-01-30 DIAGNOSIS — F1910 Other psychoactive substance abuse, uncomplicated: Secondary | ICD-10-CM | POA: Diagnosis not present

## 2018-01-30 DIAGNOSIS — E860 Dehydration: Secondary | ICD-10-CM

## 2018-01-30 DIAGNOSIS — I5030 Unspecified diastolic (congestive) heart failure: Secondary | ICD-10-CM | POA: Diagnosis not present

## 2018-01-30 LAB — HEMOGLOBIN A1C
Hgb A1c MFr Bld: 10.1 % — ABNORMAL HIGH (ref 4.8–5.6)
Mean Plasma Glucose: 243.17 mg/dL

## 2018-01-30 LAB — CBG MONITORING, ED
GLUCOSE-CAPILLARY: 236 mg/dL — AB (ref 70–99)
GLUCOSE-CAPILLARY: 399 mg/dL — AB (ref 70–99)
Glucose-Capillary: 148 mg/dL — ABNORMAL HIGH (ref 70–99)
Glucose-Capillary: 32 mg/dL — CL (ref 70–99)

## 2018-01-30 LAB — TROPONIN I
Troponin I: <0.03 ng/mL (ref <0.03)
Troponin I: <0.03 ng/mL (ref <0.03)

## 2018-01-30 LAB — GLUCOSE, CAPILLARY
Glucose-Capillary: 190 mg/dL — ABNORMAL HIGH (ref 70–99)
Glucose-Capillary: 269 mg/dL — ABNORMAL HIGH (ref 70–99)
Glucose-Capillary: 405 mg/dL — ABNORMAL HIGH (ref 70–99)
Glucose-Capillary: 466 mg/dL — ABNORMAL HIGH (ref 70–99)
Glucose-Capillary: 207 mg/dL — ABNORMAL HIGH (ref 70–99)

## 2018-01-30 LAB — ETHANOL: Alcohol, Ethyl (B): <10 mg/dL (ref <10)

## 2018-01-30 LAB — CORTISOL: Cortisol, Plasma: 7.1 ug/dL

## 2018-01-30 LAB — BASIC METABOLIC PANEL
Anion gap: 9 (ref 5–15)
BUN: 20 mg/dL (ref 8–23)
CO2: 23 mmol/L (ref 22–32)
Calcium: 8.5 mg/dL — ABNORMAL LOW (ref 8.9–10.3)
Chloride: 103 mmol/L (ref 98–111)
Creatinine, Ser: 1.28 mg/dL — ABNORMAL HIGH (ref 0.61–1.24)
GFR calc Af Amer: >60 mL/min (ref >60)
GFR calc non Af Amer: 59 mL/min — ABNORMAL LOW (ref >60)
Glucose, Bld: 420 mg/dL — ABNORMAL HIGH (ref 70–99)
Potassium: 4.5 mmol/L (ref 3.5–5.1)
Sodium: 135 mmol/L (ref 135–145)

## 2018-01-30 LAB — MRSA PCR SCREENING: MRSA by PCR: NEGATIVE

## 2018-01-30 LAB — MAGNESIUM: Magnesium: 1.8 mg/dL (ref 1.7–2.4)

## 2018-01-30 LAB — CBC
HCT: 35.7 % — ABNORMAL LOW (ref 39.0–52.0)
Hemoglobin: 12.1 g/dL — ABNORMAL LOW (ref 13.0–17.0)
MCH: 30.4 pg (ref 26.0–34.0)
MCHC: 33.9 g/dL (ref 30.0–36.0)
MCV: 89.7 fL (ref 80.0–100.0)
Platelets: 369 10*3/uL (ref 150–400)
RBC: 3.98 MIL/uL — ABNORMAL LOW (ref 4.22–5.81)
RDW: 13.3 % (ref 11.5–15.5)
WBC: 13.3 10*3/uL — ABNORMAL HIGH (ref 4.0–10.5)
nRBC: 0 % (ref 0.0–0.2)

## 2018-01-30 LAB — ECHOCARDIOGRAM COMPLETE: Weight: 2480 oz

## 2018-01-30 LAB — PHOSPHORUS: Phosphorus: 3.2 mg/dL (ref 2.5–4.6)

## 2018-01-30 LAB — POTASSIUM: POTASSIUM: 3.6 mmol/L (ref 3.5–5.1)

## 2018-01-30 LAB — GLUCOSE, RANDOM: Glucose, Bld: 435 mg/dL — ABNORMAL HIGH (ref 70–99)

## 2018-01-30 MED ORDER — ACETAMINOPHEN 325 MG PO TABS: 650.0000 mg | ORAL_TABLET | Freq: Four times a day (QID) | ORAL | Status: DC | PRN

## 2018-01-30 MED ORDER — OXYCODONE-ACETAMINOPHEN 5-325 MG PO TABS
1.0000 | ORAL_TABLET | ORAL | Status: DC | PRN
  Administered 2018-01-30 – 2018-01-31 (×8): 1 via ORAL
  Filled 2018-01-30 (×8): qty 1

## 2018-01-30 MED ORDER — HYDRALAZINE HCL 20 MG/ML IJ SOLN: 5.0000 mg | INTRAMUSCULAR | Status: DC | PRN

## 2018-01-30 MED ORDER — INSULIN NPH (HUMAN) (ISOPHANE) 100 UNIT/ML ~~LOC~~ SUSP
10.0000 [IU] | Freq: Two times a day (BID) | SUBCUTANEOUS | Status: DC
  Filled 2018-01-30: qty 10

## 2018-01-30 MED ORDER — METOCLOPRAMIDE HCL 5 MG/ML IJ SOLN
5.0000 mg | Freq: Four times a day (QID) | INTRAMUSCULAR | Status: AC
  Administered 2018-01-30 – 2018-01-31 (×3): 5 mg via INTRAVENOUS
  Filled 2018-01-30 (×4): qty 2

## 2018-01-30 MED ORDER — AMLODIPINE BESYLATE 10 MG PO TABS
10.0000 mg | ORAL_TABLET | Freq: Every day | ORAL | Status: DC
  Administered 2018-01-30 – 2018-01-31 (×2): 10 mg via ORAL
  Filled 2018-01-30: qty 2
  Filled 2018-01-30: qty 1
  Filled 2018-01-30: qty 2
  Filled 2018-01-30: qty 1

## 2018-01-30 MED ORDER — ENOXAPARIN SODIUM 40 MG/0.4ML ~~LOC~~ SOLN
40.0000 mg | SUBCUTANEOUS | Status: DC
  Administered 2018-01-30 – 2018-01-31 (×2): 40 mg via SUBCUTANEOUS
  Filled 2018-01-30 (×2): qty 0.4

## 2018-01-30 MED ORDER — VITAMIN B-1 100 MG PO TABS
100.0000 mg | ORAL_TABLET | Freq: Every day | ORAL | Status: DC
  Administered 2018-01-30 – 2018-01-31 (×2): 100 mg via ORAL
  Filled 2018-01-30 (×2): qty 1

## 2018-01-30 MED ORDER — SODIUM CHLORIDE 0.9 % IV SOLN
INTRAVENOUS | Status: DC
  Administered 2018-01-30 – 2018-01-31 (×3): via INTRAVENOUS

## 2018-01-30 MED ORDER — FOLIC ACID 1 MG PO TABS
1.0000 mg | ORAL_TABLET | Freq: Every day | ORAL | Status: DC
  Administered 2018-01-30 – 2018-01-31 (×2): 1 mg via ORAL
  Filled 2018-01-30 (×2): qty 1

## 2018-01-30 MED ORDER — INSULIN ASPART 100 UNIT/ML ~~LOC~~ SOLN
10.0000 [IU] | Freq: Two times a day (BID) | SUBCUTANEOUS | Status: DC
  Administered 2018-01-30: 10 [IU] via SUBCUTANEOUS
  Filled 2018-01-30: qty 1

## 2018-01-30 MED ORDER — ADULT MULTIVITAMIN W/MINERALS CH
1.0000 | ORAL_TABLET | Freq: Every day | ORAL | Status: DC
  Administered 2018-01-30 – 2018-01-31 (×2): 1 via ORAL
  Filled 2018-01-30 (×2): qty 1

## 2018-01-30 MED ORDER — DEXTROSE 50 % IV SOLN
INTRAVENOUS | Status: AC
  Administered 2018-01-30: 06:00:00
  Filled 2018-01-30: qty 50

## 2018-01-30 MED ORDER — ZOLPIDEM TARTRATE 5 MG PO TABS: 5.0000 mg | ORAL_TABLET | Freq: Every evening | ORAL | Status: DC | PRN

## 2018-01-30 MED ORDER — LORAZEPAM 2 MG/ML IJ SOLN: 1.0000 mg | Freq: Four times a day (QID) | INTRAMUSCULAR | Status: DC | PRN

## 2018-01-30 MED ORDER — INSULIN ASPART 100 UNIT/ML ~~LOC~~ SOLN
0.0000 [IU] | Freq: Three times a day (TID) | SUBCUTANEOUS | Status: DC
  Administered 2018-01-30: 5 [IU] via SUBCUTANEOUS
  Administered 2018-01-30: 12 [IU] via SUBCUTANEOUS
  Administered 2018-01-31: 5 [IU] via SUBCUTANEOUS

## 2018-01-30 MED ORDER — THIAMINE HCL 100 MG/ML IJ SOLN: 100.0000 mg | Freq: Every day | INTRAMUSCULAR | Status: DC

## 2018-01-30 MED ORDER — INSULIN NPH (HUMAN) (ISOPHANE) 100 UNIT/ML ~~LOC~~ SUSP
15.0000 [IU] | Freq: Two times a day (BID) | SUBCUTANEOUS | Status: DC
  Administered 2018-01-30 (×2): 15 [IU] via SUBCUTANEOUS
  Filled 2018-01-30: qty 10

## 2018-01-30 MED ORDER — ASPIRIN EC 81 MG PO TBEC
81.0000 mg | DELAYED_RELEASE_TABLET | Freq: Every day | ORAL | Status: DC
  Administered 2018-01-30 – 2018-01-31 (×2): 81 mg via ORAL
  Filled 2018-01-30 (×2): qty 1

## 2018-01-30 MED ORDER — LORAZEPAM 1 MG PO TABS
1.0000 mg | ORAL_TABLET | Freq: Four times a day (QID) | ORAL | Status: DC | PRN
  Administered 2018-01-30 – 2018-01-31 (×2): 1 mg via ORAL
  Filled 2018-01-30 (×2): qty 1

## 2018-01-30 MED ORDER — NICOTINE 14 MG/24HR TD PT24
14.0000 mg | MEDICATED_PATCH | Freq: Every day | TRANSDERMAL | Status: DC
  Administered 2018-01-30 – 2018-01-31 (×2): 14 mg via TRANSDERMAL
  Filled 2018-01-30 (×2): qty 1

## 2018-01-30 MED ORDER — ONDANSETRON HCL 4 MG/2ML IJ SOLN: 4.0000 mg | Freq: Three times a day (TID) | INTRAMUSCULAR | Status: DC | PRN

## 2018-01-30 NOTE — Care Management Note (Addendum)
Case Management Note  Patient Details  Name: [name] MRN: [id number] Date of Birth: 1955/07/12  Subjective/Objective: Syncope                  Action/Plan: 01/30/2018 - Recent hospital discharge; SW has seen the patient home homelessness in Sept 2019; PCP: [name], MD; has private insurance with [facility] Medicare; CM will continue to follow for progression of care; B Pennie Rushing  12/24/2017 -SW note CSW received consult regarding homelessness. CSW spoke with patient. He stated that he left his roommate due to him being a drug user and now has been staying with his son or on the street. He states he receives $1200 from Disability (he was denied for Medicaid due to making too much money) but he states that is not enough to live in hotels every day. He reports that his brother is a Banker but they had a falling out. He states that his mother has dementia and lives in [place] and that his ex-wife killed herself. CSW encouraged patient to look for a new apartment or boarding house instead of staying on the street to improve his health. CSW provided housing resources and a bus pass.  CSW signing off.  Cedric Fishman LCSW [phone]  Expected Discharge Date:     Possibly 02/01/2018             Expected Discharge Plan:  Home/Self Care  Discharge planning Services  CM Consult  Status of Service:  In process, will continue to follow  Sherrilyn Rist [phone] 01/30/2018, 3:11 PM

## 2018-01-30 NOTE — H&P (Signed)
History and Physical  [name] OJJ:[id number] DOB: 03-28-1956 DOA: 01/29/2018  Referring physician:  PCP: [name], MD  Outpatient Specialists:    Patient coming from: Home  Chief Complaint: "I have been passing out ", falls and uncontrolled blood sugar.    HPI: Patient is a 62 year old male with past medical history significant for diabetes mellitus that is uncontrolled, vitiligo, hypertension, hepatitis C, glaucoma, possibly substance abuse and depression.  Patient is a very poor historian.  According to the patient, he presented to the ER because he has been passing out.  Patient could not or would not give details relating to the "passing out".  Patient also reports that his blood sugar has been fairly controlled, going from high to low, with several episodes of hypoglycemia.  According to the patient, he uses NPH insulin 40 units once daily (in the morning).  Patient is an alcoholic, but reports that the alcohol use has decreased significantly.  Drink was about 2 days ago.  Patient smokes 1 pack of cigarettes daily.  Patient has abused several illicit substances in the past, including cocaine, injected intravenously.  Following recent fall, patient hurt his left knee and ankle, resulting in admitted use of the left lower extremity.  On presentation to the hospital, patient's blood sugar was found to be 681, CO2 of 18, anion gap of 12, with potassium of 5.5.  No headache, no neck pain, and no fever or chills, no nausea vomiting, no chest pain, no shortness of breath, no GI symptoms or urinary symptoms.  Patient has been admitted for further assessment and management.  ED Course: On presentation to the ED, the patient was aggressively hydrated, and was started on insulin drip.  The anion gap was only 12.  Insulin drip was eventually discontinued, and changed to NPH insulin with sliding scale insulin coverage.  Hydration has continued.  Electrolytes are still being monitored and managed.   Hospitalist team was asked to admit patient for further assessment and management.  Pertinent labs: On presentation to the hospital, sodium was 129, potassium of 5.5, chloride 99, CO2 of 18, BUN of 30, creatinine of 1.54 (up from 1.11) and blood sugar of 681 with anion gap of 12.  It was 2.9.  Total bilirubin of 1.9.  CBC reveals WBC of 9.5, hemoglobin of 13.5, hematocrit of 40.4, MCV of 90.6 with platelet count of 350.  HbA1c done on 12/24/2017 was 9.5.  UA revealed positive ketones, glucose of greater than 500 and specific gravity of 1.020.  CT scan of the head without contrast revealed "No CT evidence for acute intracranial abnormality.  Atrophy and small vessel ischemic changes of the white matter.  EKG: Independently reviewed.   Imaging: independently reviewed.   Review of Systems:   Patient is a poor historian.  Negative for fever, visual changes, sore throat, rash, new muscle aches, chest pain, SOB, dysuria, bleeding, n/v/abdominal pain.  Past Medical History:  Diagnosis Date  . DEPRESSION   . DIABETES MELLITUS, TYPE I   . DRUG ABUSE    pt should have NO controlled substances rx'ed  . Glaucoma   . HEPATITIS C    chronic  . Hypertension 02/18/2011  . 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 Vitiligo     Past Surgical History:  Procedure Laterality Date  . ABDOMINAL AORTOGRAM W/LOWER EXTREMITY N/A 07/25/2016   Procedure: Abdominal Aortogram w/Bilateral Lower Extremity Runoff;  Surgeon: Conrad [place], MD;  Location: [facility];  Service: Cardiovascular;  Laterality: N/A;  . ABDOMINAL AORTOGRAM W/LOWER EXTREMITY N/A 12/24/2016   Procedure: ABDOMINAL AORTOGRAM W/LOWER EXTREMITY;  Surgeon: Serafina Mitchell, MD;  Location: [facility];  Service: Cardiovascular;  Laterality: N/A;  . AMPUTATION TOE Right 07/26/2016   Procedure: AMPUTATION GREAT TOE;  Surgeon: Serafina Mitchell, MD;  Location: [facility];  Service: Vascular;  Laterality: Right;  . EYE SURGERY     retinal surgery  x 2, right eye  . INSERTION OF ILIAC STENT Right 07/26/2016   Procedure: INSERTION OF RIGHT POPLITEAL STENT WITH BALLOON ANGIOPLASTY;  Surgeon: Serafina Mitchell, MD;  Location: [place] [facility];  Service: Vascular;  Laterality: Right;  . LOWER EXTREMITY ANGIOGRAM Right 07/26/2016   Procedure: LOWER EXTREMITY ANGIOGRAM;  Surgeon: Serafina Mitchell, MD;  Location: [place];  Service: Vascular;  Laterality: Right;  . PERIPHERAL VASCULAR BALLOON ANGIOPLASTY Right 07/26/2016   Procedure: PERIPHERAL VASCULAR BALLOON ANGIOPLASTY  RIGHT ANTERIOR TIBEAL ARTERY AND RIGHT SUPERFICIAL FEMORAL ARTERY;  Surgeon: Serafina Mitchell, MD;  Location: [facility];  Service: Vascular;  Laterality: Right;  . PERIPHERAL VASCULAR INTERVENTION Right 12/24/2016   Procedure: PERIPHERAL VASCULAR INTERVENTION;  Surgeon: Serafina Mitchell, MD;  Location: [facility];  Service: Cardiovascular;  Laterality: Right;  lower extr     reports that he has been smoking cigarettes. He has been smoking about 1.00 pack per day. He has never used smokeless tobacco. He reports that he has current or past drug history. Drugs: "Crack" cocaine, Marijuana, and Heroin. He reports that he does not drink alcohol.  Allergies  Allergen Reactions  . Codeine Itching    Family History  Problem Relation Age of Onset  . Diabetes Father   . Kidney disease Father   . Arthritis Mother   . Heart disease Mother        CAD     Prior to Admission medications   Medication Sig Start Date End Date Taking? Authorizing Provider  insulin NPH Human (NOVOLIN N) 100 UNIT/ML injection Inject 0.32 mLs (32 Units total) into the skin daily before breakfast. Patient taking differently: Inject 40 Units into the skin daily before breakfast.  12/24/17  Yes Elgergawy, Silver Huguenin, MD  aspirin EC 81 MG EC tablet Take 1 tablet (81 mg total) by mouth daily. Patient not taking: Reported on 12/23/2017 07/30/16   Lavina Hamman, MD  blood glucose meter kit and supplies KIT Dispense based on  patient and insurance preference. Use up to four times daily as directed. (FOR ICD-9 250.00, 250.01). 12/24/17   Elgergawy, Silver Huguenin, MD    Physical Exam: Vitals:   01/30/18 0300 01/30/18 0315 01/30/18 0400 01/30/18 0637  BP: (!) 146/64 (!) 141/63 136/61 (!) 172/76  Pulse: 78 77 76 81  Resp: _0 Temp:    (!) 97.3 F (36.3 C)  TempSrc:    Oral  SpO2: 95% 96% 96% 99%  Weight:    70.3 kg     Constitutional:  . Appears calm and comfortable.  Extensive vitiligo noted. Eyes:  [name] Mild pallor. No jaundice.  ENMT:  . external ears, nose appear normal.  Dry buccal mucosa. Neck:  . Neck is supple. No JVD Respiratory:  . CTA bilaterally, no w/r/r.  . Respiratory effort normal. No retractions or accessory muscle use Cardiovascular:  . S1S2 . No LE extremity edema   Abdomen:  . Abdomen is soft and non tender. Organs are difficult to assess. Neurologic:  . Awake and alert. . Moves  all limbs.  Wt Readings from Last 3 Encounters:  01/30/18 70.3 kg  01/20/18 77.1 kg  12/24/17 68.6 kg    I have personally reviewed following labs and imaging studies  Labs on Admission:  CBC: Recent Labs  Lab 01/29/18 1942  WBC 9.5  NEUTROABS 7.5  HGB 13.5  HCT 40.4  MCV 90.6  PLT 361   Basic Metabolic Panel: Recent Labs  Lab 01/29/18 1942 01/30/18 0418  NA 129*  --   K 5.5* 3.6  CL 99  --   CO2 18*  --   GLUCOSE 681*  --   BUN 30*  --   CREATININE 1.54*  --   CALCIUM 9.0  --   MG 2.2  --    Liver Function Tests: Recent Labs  Lab 01/29/18 1942  AST 30  ALT 22  ALKPHOS 112  BILITOT 1.9*  PROT 6.0*  ALBUMIN 2.9*   Recent Labs  Lab 01/29/18 1942  LIPASE 27   No results for input(s): AMMONIA in the last 168 hours. Coagulation Profile: No results for input(s): INR, PROTIME in the last 168 hours. Cardiac Enzymes: No results for input(s): CKTOTAL, CKMB, CKMBINDEX, TROPONINI in the last 168 hours. BNP (last 3 results) No results for input(s): PROBNP in the last  8760 hours. HbA1C: No results for input(s): HGBA1C in the last 72 hours. CBG: Recent Labs  Lab 01/30/18 0132 01/30/18 0558 01/30/18 0617 01/30/18 0643 01/30/18 0802  GLUCAP 236* 32* 148* 190* 269*   Lipid Profile: No results for input(s): CHOL, HDL, LDLCALC, TRIG, CHOLHDL, LDLDIRECT in the last 72 hours. Thyroid Function Tests: No results for input(s): TSH, T4TOTAL, FREET4, T3FREE, THYROIDAB in the last 72 hours. Anemia Panel: No results for input(s): VITAMINB12, FOLATE, FERRITIN, TIBC, IRON, RETICCTPCT in the last 72 hours. Urine analysis:    Component Value Date/Time   COLORURINE STRAW (A) 01/29/2018 1942   APPEARANCEUR CLEAR 01/29/2018 1942   LABSPEC 1.020 01/29/2018 1942   PHURINE 6.0 01/29/2018 1942   GLUCOSEU >=500 (A) 01/29/2018 1942   GLUCOSEU 500 04/28/2012 1149   HGBUR SMALL (A) 01/29/2018 1942   BILIRUBINUR NEGATIVE 01/29/2018 1942   BILIRUBINUR neg 04/28/2012 1053   KETONESUR 20 (A) 01/29/2018 1942   PROTEINUR 100 (A) 01/29/2018 1942   UROBILINOGEN 4.0 (H) 11/16/2013 1646   NITRITE NEGATIVE 01/29/2018 1942   LEUKOCYTESUR NEGATIVE 01/29/2018 1942   Sepsis Labs: _0 (procalcitonin:4,lacticidven:4) )No results found for this or any previous visit (from the past 240 hour(s)).    Radiological Exams on Admission: Dg Chest 2 View  Result Date: 01/29/2018 CLINICAL DATA:  Cough.  Syncope.  Smoker. EXAM: CHEST - 2 VIEW COMPARISON:  12/23/2017 FINDINGS: The heart size and mediastinal contours are within normal limits. Both lungs are clear. Several old left rib fracture deformities are again noted. IMPRESSION: No active cardiopulmonary disease. Electronically Signed   By: Earle Gell M.D.   On: 01/29/2018 20:17   Dg Ankle Complete Left  Result Date: 01/29/2018 CLINICAL DATA:  Left ankle pain. EXAM: LEFT ANKLE COMPLETE - 3+ VIEW COMPARISON:  01/20/2018 FINDINGS: There is no evidence of fracture, dislocation, or joint effusion. There is no evidence of  arthropathy or other focal bone abnormality. Extensive peripheral vascular calcification noted. IMPRESSION: No acute findings. Electronically Signed   By: Earle Gell M.D.   On: 01/29/2018 20:19   Ct Head Wo Contrast  Result Date: 01/30/2018 CLINICAL DATA:  Syncope EXAM: CT HEAD WITHOUT CONTRAST TECHNIQUE: Contiguous axial images were obtained from the base  of the skull through the vertex without intravenous contrast. COMPARISON:  04/18/2017 FINDINGS: Brain: No acute territorial infarction, hemorrhage, or intracranial mass is visualized. Mild small vessel ischemic changes of the white matter. Mild atrophy. Stable ventricle size. Vascular: No hyperdense vessels. Vertebral artery and carotid vascular calcification Skull: Normal. Negative for fracture or focal lesion. Sinuses/Orbits: Chronic nasal bone deformity. Mild mucosal thickening in the sinuses. Mucous retention cyst in the left maxillary sinus. Other: None IMPRESSION: 1. No CT evidence for acute intracranial abnormality. 2. Atrophy and small vessel ischemic changes of the white matter Electronically Signed   By: Donavan Foil M.D.   On: 01/30/2018 02:13   Dg Knee Complete 4 Views Left  Result Date: 01/29/2018 CLINICAL DATA:  Left knee pain. EXAM: LEFT KNEE - COMPLETE 4+ VIEW COMPARISON:  None. FINDINGS: No evidence of fracture, dislocation, or joint effusion. No evidence of arthropathy or other focal bone abnormality. Extensive peripheral vascular calcification noted. IMPRESSION: No acute findings. Electronically Signed   By: Earle Gell M.D.   On: 01/29/2018 20:18    EKG: Independently reviewed.   Active Problems:   Syncope   Assessment/Plan Recurrent syncope and collapse: This likely multifactorial. Patient's blood sugar control has been erratic, with episodes of high and low blood sugars. Patient also seems volume depleted. Patient may have orthostatic drop in blood pressures. Hydrate patient. Optimize blood sugar control. Check  orthostasis. Echocardiogram Check cortisol level Doppler ultrasound of the carotids CT head result noted Further management will depend on hospital course.  Probably early DKA: DKA has resolved. Continue hydration, electrolyte management, blood glucose control with long and short acting insulin. Check HbA1c. Diabetic educator consult. Further management will depend on hospital course.  Recurrent falls: Patient is an alcoholic. Current use of left lower extremity is impaired. Physical therapy consult OT consult Patient also has history of recurrent syncope and collapse. Further management will depend on above.  Volume depletion: Continue hydration. Continue to monitor.  Tobacco use: Nicotine patch. Counseled.  Alcohol abuse: CIWA Protocol. Counseled.  Polysubstance abuse, including cocaine use: Counseled to quit. Continue to watch for possible withdrawals.   DVT prophylaxis:Subcu Lovenox Code Status: Full code Family Communication:  Disposition Plan: Will depend on hospital course Consults called: None from Admission status: Observation  Time spent: 65 minutes.  Dana Allan, MD  [name] Pager #: [phone] 7PM-7AM contact night coverage as above  01/30/2018, 8:17 AM

## 2018-01-30 NOTE — Plan of Care (Signed)
  Problem: Education: Goal: Ability to describe self-care measures that may prevent or decrease complications (Diabetes Survival Skills Education) will improve Outcome: Progressing   Problem: Cardiac: Goal: Ability to maintain an adequate cardiac output will improve Outcome: Progressing   Problem: Health Behavior/Discharge Planning: Goal: Ability to identify and utilize available resources and services will improve Outcome: Progressing   Problem: Fluid Volume: Goal: Ability to achieve a balanced intake and output will improve Outcome: Progressing   Problem: Metabolic: Goal: Ability to maintain appropriate glucose levels will improve Outcome: Not Met (add Reason)   Problem: Nutritional: Goal: Maintenance of adequate nutrition will improve Outcome: Progressing Goal: Maintenance of adequate weight for body size and type will improve Outcome: Progressing   Problem: Respiratory: Goal: Will regain and/or maintain adequate ventilation Outcome: Progressing   Problem: Urinary Elimination: Goal: Ability to achieve and maintain adequate renal perfusion and functioning will improve Outcome: Progressing

## 2018-01-30 NOTE — Progress Notes (Signed)
  Echocardiogram 2D Echocardiogram has been performed.  Darlina Sicilian M 01/30/2018, 10:42 AM

## 2018-01-30 NOTE — Progress Notes (Signed)
*  Preliminary Results* Carotid artery duplex has been completed.  Right ICA: 40-59% stenosis Left ICA: 1-39% stenosis Vertebral arteries are patent with antegrade flow.  01/30/2018 10:56 AM  Jinny Blossom Dawna Part

## 2018-01-30 NOTE — Progress Notes (Signed)
Inpatient Diabetes Program Recommendations  AACE/ADA: New Consensus Statement on Inpatient Glycemic Control (2015)  Target Ranges:  Prepandial:   less than 140 mg/dL      Peak postprandial:   less than 180 mg/dL (1-2 hours)      Critically ill patients:  140 - 180 mg/dL   Lab Results  Component Value Date   GLUCAP 466 (H) 01/30/2018   HGBA1C 10.1 (H) 01/30/2018    Review of Glycemic Control  Diabetes history: DM1 Outpatient Diabetes medications: NPH 40 units QAM Current orders for Inpatient glycemic control: NPH 15 units bid, Novolog 0-9 units tidwc.  HgbA1C - 10.1% Diabetes Coordinator spoke with pt on 12/24/2017: Spoke with patient about diabetes and home regimen for diabetes control. Patient reports that he is homeless and gets his insulin at [facility] when he gets his check in. He takes his NPH Daily and tries to "make it last." Patient reports reducing his dose himself to 39 units before d/c. Patient reports sleeping on the streets and has his belongings behind a mattress firm in a trash bag.   Ptatient would like assistance in finding shelters to stay at when discharged. Discussed with patient about his current A1c level 9.5% and discussed glucose and A1c goals. Patient reports his A1c use to be around 7%.    Inpatient Diabetes Program Recommendations:     NPH 32 units before breakfast.  Continue to encourage pt to monitor blood sugars and f/u with [place] [facility].  Thank you. Lorenda Peck, RD, LDN, CDE Inpatient Diabetes Coordinator [phone]

## 2018-01-30 NOTE — Evaluation (Addendum)
Physical Therapy Evaluation Patient Details Name: [name] MRN: [id number] DOB: 22-Mar-1956 Today's Date: 01/30/2018   History of Present Illness  Pt is a 62 y.o. M with significant PMH of diabetes mellitus, vertigo, hypertension, hepatitic C, glaucoma, substance abuse, depression. After recent fall, patient hurt his left knee and ankle. Imaging negative for fracture. Presenting to hospital with frequent episodes of "passing out;" pt blood sugar was found to be 681.  Clinical Impression  Pt admitted with above diagnosis. Pt currently with functional limitations due to the deficits listed below (see PT Problem List). Patient is homeless and unsure of living situation after discharge. Patient presenting with decreased functional mobility in setting of recent falls, LLE pain, and balance impairments. Denies dizziness with movement and does not have orthostatic hypotension (see below). Ambulating 100 feet with walker and supervision. Recommended walker use for improved independence and safety. Pt will benefit from skilled PT to increase their independence and safety with mobility to allow discharge to the venue listed below.    Orthostatic Vital Signs: Supine: 160/73 (78) Sitting: 156/87 (101) Standing: 154/97 (100)     Follow Up Recommendations Outpatient PT (dubious transport to PT; likely need to maximize acute services)    Equipment Recommendations  Rolling walker with 5" wheels    Recommendations for Other Services       Precautions / Restrictions Precautions Precautions: Fall Required Braces or Orthoses: Knee Immobilizer - Left Restrictions Weight Bearing Restrictions: No      Mobility  Bed Mobility Overal bed mobility: Independent                Transfers Overall transfer level: Needs assistance Equipment used: Rolling walker (2 wheeled) Transfers: Sit to/from [employer] Transfers Sit to Stand: Supervision Stand pivot transfers: Supervision        General transfer comment: Supervision for safety for transfers to and from [facility]  Ambulation/Gait Ambulation/Gait assistance: Supervision Gait Distance (Feet): 100 Feet Assistive device: Rolling walker (2 wheeled) Gait Pattern/deviations: Step-through pattern;Decreased step length - left;Antalgic Gait velocity: decr   General Gait Details: Patient with antalgic gait pattern secondary to knee immobilizer and pain. Moderate reliance through BUE's on walker. No overt LOB  Stairs            Wheelchair Mobility    Modified Rankin (Stroke Patients Only)       Balance Overall balance assessment: Mild deficits observed, not formally tested                                           Pertinent Vitals/Pain Pain Assessment: Faces Faces Pain Scale: Hurts a little bit Pain Location: LLE Pain Descriptors / Indicators: Guarding Pain Intervention(s): Monitored during session    Home Living Family/patient expects to be discharged to:: Shelter/Homeless                      Prior Function Level of Independence: Independent with assistive device(s)         Comments: Has been using crutches since most recent fall     Hand Dominance        Extremity/Trunk Assessment   Upper Extremity Assessment Upper Extremity Assessment: Overall WFL for tasks assessed    Lower Extremity Assessment Lower Extremity Assessment: Overall WFL for tasks assessed;LLE deficits/detail LLE Deficits / Details: L knee immobilizer donned for ambulation    Cervical / Trunk Assessment  Cervical / Trunk Assessment: Normal  Communication   Communication: No difficulties  Cognition Arousal/Alertness: Awake/alert Behavior During Therapy: WFL for tasks assessed/performed Overall Cognitive Status: No family/caregiver present to determine baseline cognitive functioning                                 General Comments: Somewhat manic and slightly impulsive.        General Comments      Exercises     Assessment/Plan    PT Assessment Patient needs continued PT services  PT Problem List Decreased strength;Decreased activity tolerance;Decreased balance;Decreased mobility;Pain       PT Treatment Interventions DME instruction;Gait training;Stair training;Functional mobility training;Therapeutic activities;Therapeutic exercise;Balance training;Patient/family education    PT Goals (Current goals can be found in the Care Plan section)  Acute Rehab PT Goals Patient Stated Goal: "find somewhere to stay." PT Goal Formulation: With patient Time For Goal Achievement: 02/13/18 Potential to Achieve Goals: Good    Frequency Min 3X/week   Barriers to discharge Other (comment)(homeless)      Co-evaluation               AM-PAC PT "6 Clicks" Daily Activity  Outcome Measure Difficulty turning over in bed (including adjusting bedclothes, sheets and blankets)?: None Difficulty moving from lying on back to sitting on the side of the bed? : None Difficulty sitting down on and standing up from a chair with arms (e.g., wheelchair, bedside commode, etc,.)?: A Little Help needed moving to and from a bed to chair (including a wheelchair)?: A Little Help needed walking in hospital room?: A Little Help needed climbing 3-5 steps with a railing? : A Little 6 Click Score: 20    End of Session Equipment Utilized During Treatment: Left knee immobilizer Activity Tolerance: Patient tolerated treatment well Patient left: in bed;with call bell/phone within reach;with nursing/sitter in room Nurse Communication: Mobility status PT Visit Diagnosis: Unsteadiness on feet (R26.81);Other abnormalities of gait and mobility (R26.89);History of falling (Z91.81);Difficulty in walking, not elsewhere classified (R26.2)    Time: 6629-4765 PT Time Calculation (min) (ACUTE ONLY): 24 min   Charges:   PT Evaluation $PT Eval Moderate Complexity: 1 Mod PT Treatments $Gait  Training: 8-22 mins       [name], PT, DPT Acute Rehabilitation Services Pager [phone] Office [phone]   [name] 01/30/2018, 4:22 PM

## 2018-01-30 NOTE — Consult Note (Signed)
   [facility] CM Inpatient Consult   01/30/2018  [name] 1955/10/05 [id number]  Patient screened for high risk for unplanned readmission. Chart reviewed for hospitalization and MD [[place]]  History notes reveals:  Patient is a 62 year old male with past medical history significant for diabetes mellitus that is uncontrolled, vitiligo, hypertension, hepatitis C, glaucoma, possibly substance abuse and depression.  Patient is a very poor historian.  According to the patient, he presented to the ER because he has been passing out.  Patient could not or would not give details relating to the "passing out".  Patient also reports that his blood sugar has been fairly controlled, going from high to low, with several episodes of hypoglycemia.  According to the patient, he uses NPH insulin 40 units once daily (in the morning).  Patient is an alcoholic, but reports that the alcohol use has decreased significantly. Hx of homeless noted and alerted inpatient care management staff.  Patient is currently on CIWA protocol.  Will follow up when more appropriate.      Please place a [facility] Care Management consult or for questions contact:   [name], RN BSN [facility] Liaison  [phone] business mobile phone Toll free office [phone]

## 2018-01-30 NOTE — Care Management (Signed)
This is a no charge note  Pending admission per Dr. Stark Jock  62 year old man with past medical history of hypertension, diabetes mellitus, HCV, depression, who presents with 4 episodes of syncope, and left leg pain.  Patient was found to have elevated blood sugar 681, potassium 5.5, bicarbonate 18, but anion gap 12, WBC 9.5, lactic acid 2.04, 1.69, negative troponin x2, lipase 2 7, chest x-ray negative. Patient was initially started insulin drip, blood sugar improved to 2-37.  I discontinued insulin drip, started NPH insulin, and sliding scale insulin.  Will order CT head.  Patient is placed on telemetry bed for observation.  Will repeat potassium level.  Ivor Costa, MD  [name] Pager [phone]  If 7PM-7AM, please contact night-coverage [email or web address] Password [email or web address] 01/30/2018, 2:44 AM

## 2018-01-30 NOTE — Progress Notes (Signed)
CRITICAL VALUE ALERT  Critical Value:  CBG 405  Date & Time Notied:  10/18 1230  Provider Notified: Dr. Marthenia Rolling  Orders Received/Actions taken: Stat lab

## 2018-01-30 NOTE — Progress Notes (Signed)
Patient smokes and alcohol last drink 2 days ago. MD aware

## 2018-01-31 DIAGNOSIS — S81821A Laceration with foreign body, right lower leg, initial encounter: Secondary | ICD-10-CM | POA: Diagnosis not present

## 2018-01-31 DIAGNOSIS — Z89411 Acquired absence of right great toe: Secondary | ICD-10-CM | POA: Diagnosis not present

## 2018-01-31 DIAGNOSIS — I70261 Atherosclerosis of native arteries of extremities with gangrene, right leg: Secondary | ICD-10-CM | POA: Diagnosis not present

## 2018-01-31 DIAGNOSIS — R55 Syncope and collapse: Secondary | ICD-10-CM | POA: Diagnosis not present

## 2018-01-31 LAB — COMPREHENSIVE METABOLIC PANEL
ALT: 13 U/L (ref 0–44)
AST: 15 U/L (ref 15–41)
Albumin: 2.3 g/dL — ABNORMAL LOW (ref 3.5–5.0)
Alkaline Phosphatase: 76 U/L (ref 38–126)
Anion gap: 9 (ref 5–15)
BUN: 18 mg/dL (ref 8–23)
CO2: 23 mmol/L (ref 22–32)
Calcium: 8.8 mg/dL — ABNORMAL LOW (ref 8.9–10.3)
Chloride: 108 mmol/L (ref 98–111)
Creatinine, Ser: 1.15 mg/dL (ref 0.61–1.24)
GFR calc Af Amer: >60 mL/min (ref >60)
GFR calc non Af Amer: >60 mL/min (ref >60)
Glucose, Bld: 47 mg/dL — ABNORMAL LOW (ref 70–99)
Potassium: 4.4 mmol/L (ref 3.5–5.1)
Sodium: 140 mmol/L (ref 135–145)
Total Bilirubin: 0.3 mg/dL (ref 0.3–1.2)
Total Protein: 5.3 g/dL — ABNORMAL LOW (ref 6.5–8.1)

## 2018-01-31 LAB — URINE CULTURE: CULTURE: NO GROWTH

## 2018-01-31 LAB — GLUCOSE, CAPILLARY
GLUCOSE-CAPILLARY: 59 mg/dL — AB (ref 70–99)
Glucose-Capillary: 59 mg/dL — ABNORMAL LOW (ref 70–99)
Glucose-Capillary: 201 mg/dL — ABNORMAL HIGH (ref 70–99)

## 2018-01-31 MED ORDER — FOLIC ACID 1 MG PO TABS: 1.0000 mg | ORAL_TABLET | Freq: Every day | ORAL | 0 refills | Status: DC

## 2018-01-31 MED ORDER — NICOTINE 14 MG/24HR TD PT24: 14.0000 mg | MEDICATED_PATCH | Freq: Every day | TRANSDERMAL | 0 refills | Status: DC

## 2018-01-31 MED ORDER — AMLODIPINE BESYLATE 10 MG PO TABS: 10.0000 mg | ORAL_TABLET | Freq: Every day | ORAL | 0 refills | Status: DC

## 2018-01-31 MED ORDER — THIAMINE HCL 100 MG PO TABS: 100.0000 mg | ORAL_TABLET | Freq: Every day | ORAL | 2 refills | Status: AC

## 2018-01-31 MED ORDER — INSULIN NPH (HUMAN) (ISOPHANE) 100 UNIT/ML ~~LOC~~ SUSP: 15.0000 [IU] | Freq: Every day | SUBCUTANEOUS | Status: DC

## 2018-01-31 MED ORDER — ADULT MULTIVITAMIN W/MINERALS CH: 1.0000 | ORAL_TABLET | Freq: Every day | ORAL | 2 refills | Status: AC

## 2018-01-31 MED ORDER — INSULIN NPH (HUMAN) (ISOPHANE) 100 UNIT/ML ~~LOC~~ SUSP: 15.0000 [IU] | Freq: Every day | SUBCUTANEOUS | 11 refills | Status: DC

## 2018-01-31 NOTE — Plan of Care (Signed)
  Problem: Education: Goal: Ability to describe self-care measures that may prevent or decrease complications (Diabetes Survival Skills Education) will improve Outcome: Progressing   Problem: Health Behavior/Discharge Planning: Goal: Ability to identify and utilize available resources and services will improve Outcome: Progressing   Problem: Health Behavior/Discharge Planning: Goal: Ability to manage health-related needs will improve Outcome: Progressing   Problem: Nutritional: Goal: Maintenance of adequate nutrition will improve Outcome: Progressing

## 2018-01-31 NOTE — Discharge Summary (Signed)
Physician Discharge Summary  [name] VFI:[id number] DOB: May 01, 1955 DOA: 01/29/2018  PCP: [name], MD  Admit date: 01/29/2018 Discharge date: 02/01/2018  Admitted From: Home Disposition: Home  Recommendations for Outpatient Follow-up:  1. Follow up with PCP in 1-2 days 2. Please obtain BMP/CBC in one week   Home Health: No Equipment/Devices:*None  Discharge Condition: Stable improved CODE STATUS:*Full code Diet recommendation: Heart Healthy / Carb Modified   Brief/Interim Summary: Patient is a 62 year old male with past medical history significant for diabetes mellitus that is uncontrolled, vitiligo, hypertension, hepatitis C, glaucoma, possibly substance abuse and depression.  Patient is a very poor historian.  According to the patient, he presented to the ER because he has been passing out.  Patient could not or would not give details relating to the "passing out".  Patient also reports that his blood sugar has been fairly controlled, going from high to low, with several episodes of hypoglycemia.  According to the patient, he uses NPH insulin 40 units once daily (in the morning).  Patient is an alcoholic, but reports that the alcohol use has decreased significantly.  Drink was about 2 days ago.  Patient smokes 1 pack of cigarettes daily.  Patient has abused several illicit substances in the past, including cocaine, injected intravenously.  Following recent fall, patient hurt his left knee and ankle, resulting in admitted use of the left lower extremity.  On presentation to the hospital, patient's blood sugar was found to be 681, CO2 of 18, anion gap of 12, with potassium of 5.5.  No headache, no neck pain, and no fever or chills, no nausea vomiting, no chest pain, no shortness of breath, no GI symptoms or urinary symptoms.  Patient has been admitted for further assessment and management of syncope and hyperglycemia.  Patient was started on an insulin drip that was quickly  discontinued and he was able to be changed to NPH insulin.  His blood pressure was markedly elevated but he started taking his medication there was no problem with control.  Patient admits that he is having difficulty obtaining his medications.  Is felt that his syncope was due to hypertension from not taking his medicines in conjunction with an elevated blood glucose. Work saw the patient assisted him in any way they could.  Patient has reached maximal benefit of hospitalization.  Discharge diagnosis, prognosis, plans, follow-up, medications and treatments discussed with the patient(or responsible party) and is in agreement with the plans as described.  Patient is stable for discharge.  Discharge Diagnoses:  Active Problems:   Syncope    Discharge Instructions  Discharge Instructions    Diet - low sodium heart healthy   Complete by:  As directed    Discharge instructions   Complete by:  As directed    Take new different dose of insulin as indicated in medications. Follow-up with Dr. Quay Burow as stated in discharge instructions.   Increase activity slowly   Complete by:  As directed      Allergies as of 01/31/2018      Reactions   Codeine Itching      Medication List    TAKE these medications   amLODipine 10 MG tablet Commonly known as:  NORVASC Take 1 tablet (10 mg total) by mouth daily.   aspirin 81 MG EC tablet Take 1 tablet (81 mg total) by mouth daily.   blood glucose meter kit and supplies Kit Dispense based on patient and insurance preference. Use up to four times daily  as directed. (FOR ICD-9 250.00, 250.01).   folic acid 1 MG tablet Commonly known as:  FOLVITE Take 1 tablet (1 mg total) by mouth daily.   insulin NPH Human 100 UNIT/ML injection Commonly known as:  HUMULIN N,NOVOLIN N Inject 0.15 mLs (15 Units total) into the skin daily before breakfast. What changed:  how much to take   multivitamin with minerals Tabs tablet Take 1 tablet by mouth daily.    nicotine 14 mg/24hr patch Commonly known as:  NICODERM CQ - dosed in mg/24 hours Place 1 patch (14 mg total) onto the skin daily.   thiamine 100 MG tablet Take 1 tablet (100 mg total) by mouth daily.      Follow-up Information    [name], MD. Schedule an appointment as soon as possible for a visit in 2 day(s).   Specialty:  Internal Medicine Contact information: [place] [address] [phone]          Allergies  Allergen Reactions  . Codeine Itching       Procedures/Studies: Dg Chest 2 View  Result Date: 01/29/2018 CLINICAL DATA:  Cough.  Syncope.  Smoker. EXAM: CHEST - 2 VIEW COMPARISON:  12/23/2017 FINDINGS: The heart size and mediastinal contours are within normal limits. Both lungs are clear. Several old left rib fracture deformities are again noted. IMPRESSION: No active cardiopulmonary disease. Electronically Signed   By: Earle Gell M.D.   On: 01/29/2018 20:17   Dg Tibia/fibula Left  Result Date: 01/20/2018 CLINICAL DATA:  Left lower leg pain after fall down steps. EXAM: LEFT TIBIA AND FIBULA - 2 VIEW COMPARISON:  None. FINDINGS: There is no evidence of fracture or other focal bone lesions. Soft tissues are unremarkable. IMPRESSION: Negative. Electronically Signed   By: Marijo Conception, M.D.   On: 01/20/2018 15:02   Dg Ankle Complete Left  Result Date: 01/29/2018 CLINICAL DATA:  Left ankle pain. EXAM: LEFT ANKLE COMPLETE - 3+ VIEW COMPARISON:  01/20/2018 FINDINGS: There is no evidence of fracture, dislocation, or joint effusion. There is no evidence of arthropathy or other focal bone abnormality. Extensive peripheral vascular calcification noted. IMPRESSION: No acute findings. Electronically Signed   By: Earle Gell M.D.   On: 01/29/2018 20:19   Dg Ankle Complete Left  Result Date: 01/20/2018 CLINICAL DATA:  Left ankle pain after fall down steps. EXAM: LEFT ANKLE COMPLETE - 3+ VIEW COMPARISON:  None. FINDINGS: There is no evidence of  fracture, dislocation, or joint effusion. There is no evidence of arthropathy or other focal bone abnormality. Soft tissues are unremarkable. IMPRESSION: Negative. Electronically Signed   By: Marijo Conception, M.D.   On: 01/20/2018 15:02   Ct Head Wo Contrast  Result Date: 01/30/2018 CLINICAL DATA:  Syncope EXAM: CT HEAD WITHOUT CONTRAST TECHNIQUE: Contiguous axial images were obtained from the base of the skull through the vertex without intravenous contrast. COMPARISON:  04/18/2017 FINDINGS: Brain: No acute territorial infarction, hemorrhage, or intracranial mass is visualized. Mild small vessel ischemic changes of the white matter. Mild atrophy. Stable ventricle size. Vascular: No hyperdense vessels. Vertebral artery and carotid vascular calcification Skull: Normal. Negative for fracture or focal lesion. Sinuses/Orbits: Chronic nasal bone deformity. Mild mucosal thickening in the sinuses. Mucous retention cyst in the left maxillary sinus. Other: None IMPRESSION: 1. No CT evidence for acute intracranial abnormality. 2. Atrophy and small vessel ischemic changes of the white matter Electronically Signed   By: Donavan Foil M.D.   On: 01/30/2018 02:13  Dg Knee Complete 4 Views Left  Result Date: 01/29/2018 CLINICAL DATA:  Left knee pain. EXAM: LEFT KNEE - COMPLETE 4+ VIEW COMPARISON:  None. FINDINGS: No evidence of fracture, dislocation, or joint effusion. No evidence of arthropathy or other focal bone abnormality. Extensive peripheral vascular calcification noted. IMPRESSION: No acute findings. Electronically Signed   By: Earle Gell M.D.   On: 01/29/2018 20:18      Subjective:   Discharge Exam: Vitals:   01/31/18 1126 01/31/18 1200  BP: (!) 164/141 (!) 162/92  Pulse: 80 72  Resp:  18  Temp:    SpO2:     Vitals:   01/31/18 1031 01/31/18 1033 01/31/18 1126 01/31/18 1200  BP:   (!) 164/141 (!) 162/92  Pulse: 83 82 80 72  Resp:    18  Temp:      TempSrc:      SpO2:      Weight:         General: Pt is alert, awake, not in acute distress Cardiovascular: RRR, S1/S2 +, no rubs, no gallops Respiratory: CTA bilaterally, no wheezing, no rhonchi Abdominal: Soft, NT, ND, bowel sounds + Extremities: no edema, no cyanosis    The results of significant diagnostics from this hospitalization (including imaging, microbiology, ancillary and laboratory) are listed below for reference.     Microbiology: Recent Results (from the past 240 hour(s))  Urine culture     Status: None   Collection Time: 01/29/18  7:42 PM  Result Value Ref Range Status   Specimen Description URINE, RANDOM  Final   Special Requests NONE  Final   Culture   Final    NO GROWTH Performed at [facility], [address] [place], [place] [address]    Report Status 01/31/2018 FINAL  Final  MRSA PCR Screening     Status: None   Collection Time: 01/30/18  7:10 AM  Result Value Ref Range Status   MRSA by PCR NEGATIVE NEGATIVE Final    Comment:        The GeneXpert MRSA Assay (FDA approved for NASAL specimens only), is one component of a comprehensive MRSA colonization surveillance program. It is not intended to diagnose MRSA infection nor to guide or monitor treatment for MRSA infections. Performed at [facility], [place] [address], [place], [place] [address]      Labs: BNP (last 3 results) No results for input(s): BNP in the last 8760 hours. Basic Metabolic Panel: Recent Labs  Lab 01/29/18 1942 01/30/18 0418 01/30/18 0922 01/30/18 1253 01/31/18 0612  NA 129*  --  135  --  140  K 5.5* 3.6 4.5  --  4.4  CL 99  --  103  --  108  CO2 18*  --  23  --  23  GLUCOSE 681*  --  420* 435* 47*  BUN 30*  --  20  --  18  CREATININE 1.54*  --  1.28*  --  1.15  CALCIUM 9.0  --  8.5*  --  8.8*  MG 2.2  --  1.8  --   --   PHOS  --   --  3.2  --   --    Liver Function Tests: Recent Labs  Lab 01/29/18 1942 01/31/18 0612  AST 30 15  ALT 22 13  ALKPHOS 112 76  BILITOT 1.9* 0.3  PROT  6.0* 5.3*  ALBUMIN 2.9* 2.3*   Recent Labs  Lab 01/29/18 1942  LIPASE 27  No results for input(s): AMMONIA in the last 168 hours. CBC: Recent Labs  Lab 01/29/18 1942 01/30/18 0922  WBC 9.5 13.3*  NEUTROABS 7.5  --   HGB 13.5 12.1*  HCT 40.4 35.7*  MCV 90.6 89.7  PLT 350 369   Cardiac Enzymes: Recent Labs  Lab 01/30/18 0922 01/30/18 1143  TROPONINI <0.03 <0.03   BNP: Invalid input(s): POCBNP CBG: Recent Labs  Lab 01/30/18 2003 01/31/18 0754 01/31/18 0809 01/31/18 0902 01/31/18 1127  GLUCAP 207* 59* 59* 201* 294*   D-Dimer No results for input(s): DDIMER in the last 72 hours. Hgb A1c Recent Labs    01/30/18 0922  HGBA1C 10.1*   Lipid Profile No results for input(s): CHOL, HDL, LDLCALC, TRIG, CHOLHDL, LDLDIRECT in the last 72 hours. Thyroid function studies No results for input(s): TSH, T4TOTAL, T3FREE, THYROIDAB in the last 72 hours.  Invalid input(s): FREET3 Anemia work up No results for input(s): VITAMINB12, FOLATE, FERRITIN, TIBC, IRON, RETICCTPCT in the last 72 hours. Urinalysis    Component Value Date/Time   COLORURINE STRAW (A) 01/29/2018 1942   APPEARANCEUR CLEAR 01/29/2018 1942   LABSPEC 1.020 01/29/2018 1942   PHURINE 6.0 01/29/2018 1942   GLUCOSEU >=500 (A) 01/29/2018 1942   GLUCOSEU 500 04/28/2012 1149   HGBUR SMALL (A) 01/29/2018 1942   BILIRUBINUR NEGATIVE 01/29/2018 1942   BILIRUBINUR neg 04/28/2012 1053   KETONESUR 20 (A) 01/29/2018 1942   PROTEINUR 100 (A) 01/29/2018 1942   UROBILINOGEN 4.0 (H) 11/16/2013 1646   NITRITE NEGATIVE 01/29/2018 1942   LEUKOCYTESUR NEGATIVE 01/29/2018 1942   Sepsis Labs Invalid input(s): PROCALCITONIN,  WBC,  LACTICIDVEN Microbiology Recent Results (from the past 240 hour(s))  Urine culture     Status: None   Collection Time: 01/29/18  7:42 PM  Result Value Ref Range Status   Specimen Description URINE, RANDOM  Final   Special Requests NONE  Final   Culture   Final    NO GROWTH Performed at  [facility], [place] [address], [place], [place] [address]    Report Status 01/31/2018 FINAL  Final  MRSA PCR Screening     Status: None   Collection Time: 01/30/18  7:10 AM  Result Value Ref Range Status   MRSA by PCR NEGATIVE NEGATIVE Final    Comment:        The GeneXpert MRSA Assay (FDA approved for NASAL specimens only), is one component of a comprehensive MRSA colonization surveillance program. It is not intended to diagnose MRSA infection nor to guide or monitor treatment for MRSA infections. Performed at [facility], [place] [address], [place], [place] [address]      Time coordinating discharge: 45 minutes  SIGNED:   Lady Deutscher, MD  FACP [name] 02/01/2018, 3:00 PM Pager   If 7PM-7AM, please contact night-coverage [email or web address] Password [email or web address]

## 2018-01-31 NOTE — Progress Notes (Signed)
Discharge instructions reviewed with patient, IV removed, questions and concerns answered, packet given Neta Mends RN 3:44 PM 01-31-2018

## 2018-01-31 NOTE — Progress Notes (Addendum)
Physical Therapy Treatment Patient Details Name: [name] MRN: [id number] DOB: Aug 18, 1955 Today's Date: 01/31/2018    History of Present Illness Pt is a 62 y.o. male admitted 01/29/18 after fall hurting LLE and frequent episodes of "passing out"; found to be hyperglycemic. LLE imaging negative for acute injury. PMH includes DM, HTN, vertigo, hepatitic C, glaucoma, substance abuse, depression.    PT Comments    Pt progressing well with mobility. Stability much improved ambulating with RW, although pt remains limited by LLE pain. Attempted education regarding safe mobility and fall risk reduction upon return home, but pt distracted by current social hardships; little teach back displayed. If to remain admitted, will follow acutely.    Follow Up Recommendations  Outpatient PT(unreliable transportation; likely need to maximize acute services)     Equipment Recommendations  Rolling walker with 5" wheels    Recommendations for Other Services       Precautions / Restrictions Precautions Precautions: Fall Required Braces or Orthoses: Knee Immobilizer - Left Restrictions Weight Bearing Restrictions: No    Mobility  Bed Mobility Overal bed mobility: Independent                Transfers Overall transfer level: Modified independent Equipment used: Rolling walker (2 wheeled) Transfers: Sit to/from Stand              Ambulation/Gait Ambulation/Gait assistance: Supervision Gait Distance (Feet): 120 Feet Assistive device: Rolling walker (2 wheeled) Gait Pattern/deviations: Step-through pattern;Decreased stride length;Decreased weight shift to left Gait velocity: Decreased Gait velocity interpretation: 1.31 - 2.62 ft/sec, indicative of limited community ambulator General Gait Details: Attempted ambulation without L knee immobilizer as pt reports discomfort with limited knee ROM. Antalgic amb with RW and decreased WB through LLE; supervision for safety   Stairs              Wheelchair Mobility    Modified Rankin (Stroke Patients Only)       Balance Overall balance assessment: Needs assistance   Sitting balance-Leahy Scale: Good       Standing balance-Leahy Scale: Fair Standing balance comment: Can static stand without UE support                            Cognition Arousal/Alertness: Awake/alert Behavior During Therapy: WFL for tasks assessed/performed Overall Cognitive Status: No family/caregiver present to determine baseline cognitive functioning                                 General Comments: Tangential with speech; somewhat manic, slightly impulsive. Required intermittent redirection to task      Exercises      General Comments        Pertinent Vitals/Pain Pain Assessment: Faces Faces Pain Scale: Hurts little more Pain Location: LLE Pain Descriptors / Indicators: Guarding;Sore Pain Intervention(s): Monitored during session;Limited activity within patient's tolerance    Home Living                      Prior Function            PT Goals (current goals can now be found in the care plan section) Acute Rehab PT Goals Patient Stated Goal: Find my wallet and remember what address I'm staying at PT Goal Formulation: With patient Time For Goal Achievement: 02/13/18 Potential to Achieve Goals: Good Progress towards PT goals: Progressing toward goals  Frequency    Min 3X/week      PT Plan Current plan remains appropriate    Co-evaluation              AM-PAC PT "6 Clicks" Daily Activity  Outcome Measure  Difficulty turning over in bed (including adjusting bedclothes, sheets and blankets)?: None Difficulty moving from lying on back to sitting on the side of the bed? : None Difficulty sitting down on and standing up from a chair with arms (e.g., wheelchair, bedside commode, etc,.)?: None Help needed moving to and from a bed to chair (including a wheelchair)?: A  Little Help needed walking in hospital room?: A Little Help needed climbing 3-5 steps with a railing? : A Little 6 Click Score: 21    End of Session Equipment Utilized During Treatment: Left knee immobilizer Activity Tolerance: Patient tolerated treatment well Patient left: in bed;with call bell/phone within reach;with nursing/sitter in room;with bed alarm set Nurse Communication: Mobility status PT Visit Diagnosis: Unsteadiness on feet (R26.81);Other abnormalities of gait and mobility (R26.89);History of falling (Z91.81);Difficulty in walking, not elsewhere classified (R26.2)     Time: 3838-1840 PT Time Calculation (min) (ACUTE ONLY): 15 min  Charges:  $Gait Training: 8-22 mins                     Mabeline Caras, PT, DPT Acute Rehabilitation Services  Pager [phone] Office [place] 01/31/2018, 1:29 PM

## 2018-01-31 NOTE — Progress Notes (Signed)
  Follow up for Hypoglycemic Event  CBG: 201  Follow-up CBG: Time 0902
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
## 2018-01-31 NOTE — Progress Notes (Addendum)
Rolling walker to be delivered to room prior to DC. Patient provided with cab voucher.

## 2018-01-31 NOTE — Progress Notes (Signed)
Hypoglycemic Event  CBG: 59  Treatment: graham crackers and juice  Symptoms: asymptomatic  Follow-up CBG: Time:0810 CBG Result:59    Comments/MD notified:yes and paged    Julianne Handler

## 2018-01-31 NOTE — Care Management Obs Status (Signed)
[facility] NOTIFICATION   Patient Details  Name: [name] MRN: [id number] Date of Birth: 06-Jun-1955   Medicare Observation Status Notification Given:  Yes    [name], RN 01/31/2018, 12:56 PM

## 2018-02-01 LAB — GLUCOSE, CAPILLARY: GLUCOSE-CAPILLARY: 294 mg/dL — AB (ref 70–99)

## 2018-02-02 ENCOUNTER — Emergency Department (HOSPITAL_COMMUNITY): Payer: Medicare HMO

## 2018-02-02 ENCOUNTER — Encounter (HOSPITAL_COMMUNITY): Payer: Self-pay | Admitting: Emergency Medicine

## 2018-02-02 ENCOUNTER — Emergency Department (HOSPITAL_COMMUNITY)
Admission: EM | Admit: 2018-02-02 | Discharge: 2018-02-02 | Disposition: A | Discharge status: Home / Self Care | Payer: Medicare HMO | Source: Home / Self Care | Attending: Emergency Medicine | Admitting: Emergency Medicine

## 2018-02-02 ENCOUNTER — Telehealth: Payer: Self-pay | Admitting: *Deleted

## 2018-02-02 DIAGNOSIS — I959 Hypotension, unspecified: Secondary | ICD-10-CM | POA: Diagnosis not present

## 2018-02-02 DIAGNOSIS — F17210 Nicotine dependence, cigarettes, uncomplicated: Secondary | ICD-10-CM | POA: Insufficient documentation

## 2018-02-02 DIAGNOSIS — I10 Essential (primary) hypertension: Secondary | ICD-10-CM

## 2018-02-02 DIAGNOSIS — R109 Unspecified abdominal pain: Secondary | ICD-10-CM

## 2018-02-02 DIAGNOSIS — J449 Chronic obstructive pulmonary disease, unspecified: Secondary | ICD-10-CM | POA: Insufficient documentation

## 2018-02-02 DIAGNOSIS — S3991XA Unspecified injury of abdomen, initial encounter: Secondary | ICD-10-CM | POA: Diagnosis not present

## 2018-02-02 DIAGNOSIS — E1065 Type 1 diabetes mellitus with hyperglycemia: Secondary | ICD-10-CM

## 2018-02-02 DIAGNOSIS — E1165 Type 2 diabetes mellitus with hyperglycemia: Secondary | ICD-10-CM | POA: Diagnosis not present

## 2018-02-02 DIAGNOSIS — Z794 Long term (current) use of insulin: Secondary | ICD-10-CM

## 2018-02-02 DIAGNOSIS — R739 Hyperglycemia, unspecified: Secondary | ICD-10-CM

## 2018-02-02 DIAGNOSIS — R5381 Other malaise: Secondary | ICD-10-CM | POA: Diagnosis not present

## 2018-02-02 LAB — CBG MONITORING, ED
Glucose-Capillary: 478 mg/dL — ABNORMAL HIGH (ref 70–99)
Glucose-Capillary: 539 mg/dL (ref 70–99)
Glucose-Capillary: 234 mg/dL — ABNORMAL HIGH (ref 70–99)

## 2018-02-02 LAB — BASIC METABOLIC PANEL
Anion gap: 9 (ref 5–15)
BUN: 29 mg/dL — ABNORMAL HIGH (ref 8–23)
CO2: 25 mmol/L (ref 22–32)
Calcium: 9 mg/dL (ref 8.9–10.3)
Chloride: 100 mmol/L (ref 98–111)
Creatinine, Ser: 1.4 mg/dL — ABNORMAL HIGH (ref 0.61–1.24)
GFR calc Af Amer: >60 mL/min (ref >60)
GFR calc non Af Amer: 53 mL/min — ABNORMAL LOW (ref >60)
Glucose, Bld: 572 mg/dL (ref 70–99)
Potassium: 4.3 mmol/L (ref 3.5–5.1)
Sodium: 134 mmol/L — ABNORMAL LOW (ref 135–145)

## 2018-02-02 LAB — CBC
HCT: 39.3 % (ref 39.0–52.0)
Hemoglobin: 13 g/dL (ref 13.0–17.0)
MCH: 30.2 pg (ref 26.0–34.0)
MCHC: 33.1 g/dL (ref 30.0–36.0)
MCV: 91.2 fL (ref 80.0–100.0)
Platelets: 322 10*3/uL (ref 150–400)
RBC: 4.31 MIL/uL (ref 4.22–5.81)
RDW: 13.2 % (ref 11.5–15.5)
WBC: 10.9 10*3/uL — ABNORMAL HIGH (ref 4.0–10.5)
nRBC: 0 % (ref 0.0–0.2)

## 2018-02-02 LAB — URINALYSIS, ROUTINE W REFLEX MICROSCOPIC
Bacteria, UA: NONE SEEN
Bilirubin Urine: NEGATIVE
Glucose, UA: >=500 mg/dL — AB
Hgb urine dipstick: NEGATIVE
Ketones, ur: 5 mg/dL — AB
Leukocytes, UA: NEGATIVE
Nitrite: NEGATIVE
Protein, ur: 100 mg/dL — AB
Specific Gravity, Urine: 1.025 (ref 1.005–1.030)
pH: 6 (ref 5.0–8.0)

## 2018-02-02 MED ORDER — LACTATED RINGERS IV BOLUS
1000.0000 mL | Freq: Once | INTRAVENOUS | Status: AC
  Administered 2018-02-02: 1000 mL via INTRAVENOUS

## 2018-02-02 MED ORDER — SODIUM CHLORIDE 0.9 % IJ SOLN
INTRAMUSCULAR | Status: AC
  Filled 2018-02-02: qty 50

## 2018-02-02 MED ORDER — INSULIN ASPART 100 UNIT/ML ~~LOC~~ SOLN
10.0000 [IU] | Freq: Once | SUBCUTANEOUS | Status: AC
  Administered 2018-02-02: 10 [IU] via INTRAVENOUS
  Filled 2018-02-02: qty 1

## 2018-02-02 MED ORDER — HYDROMORPHONE HCL 1 MG/ML IJ SOLN
0.5000 mg | Freq: Once | INTRAMUSCULAR | Status: AC
  Administered 2018-02-02: 0.5 mg via INTRAVENOUS
  Filled 2018-02-02: qty 1

## 2018-02-02 MED ORDER — IOPAMIDOL (ISOVUE-300) INJECTION 61%
100.0000 mL | Freq: Once | INTRAVENOUS | Status: AC | PRN
  Administered 2018-02-02: 100 mL via INTRAVENOUS

## 2018-02-02 MED ORDER — IOPAMIDOL (ISOVUE-300) INJECTION 61%
INTRAVENOUS | Status: AC
  Filled 2018-02-02: qty 100

## 2018-02-02 NOTE — ED Notes (Signed)
Pt called RN in and was angry about not getting more pain medication. RN explained to pt that she could not give him more as the MD was not putting in the order for more pain medicine.

## 2018-02-02 NOTE — ED Triage Notes (Signed)
Per PTAR coming from [place] for not taking his insulin today and CBg 400s and not feeling well. Pt was recently admitted at [facility] but wasn't happy with his care there.
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
## 2018-02-02 NOTE — ED Notes (Addendum)
RN went in to draw labwork and start and IV and Pt began to verbally berate RN. RN walked into room and before I could introduce myself pt stated "I haven't shit today. I need to shit. Make me shit! I went 2 days ago and my leg hurts and they say it isn't broken but the pain is a 10/10 and nobody cares because i'm a frequent flyer and I don't drink, smoke, cheat or lie, but nobody cares and I need to shit!"  RN explained to pt that the staff here does care, but it was rather inappropriate to yell at RN when she was trying to assist him. Pt the angrily picked up his crutches and said "Just leave me in the bathroom until I shit" RN unable to get labs at this time.

## 2018-02-02 NOTE — ED Provider Notes (Signed)
[facility] DEPT Provider Note   CSN: [id number] Arrival date & time: 02/02/18  1220     History   Chief Complaint Chief Complaint  Patient presents with  . Hyperglycemia    HPI [name] is a 62 y.o. male.  HPI   62 year old male with multiple complaints.  He states that EMS was called by [employer] employees after he was falling off a bench there.  EMS checked his blood sugar was in the 400s so he was brought to the emergency room.  He is on insulin.  He supposed to take NPH.  He states that he did not take it because he is homeless symptoms raise blood sugar would drop would be able to do anything since he had nothing to eat.  He has multiple other complaints including leg pain which seems to be chronic abdominal pain and constipation although he reports his last bowel movement was 2 days ago.  Past Medical History:  Diagnosis Date  . DEPRESSION   . DIABETES MELLITUS, TYPE I   . DRUG ABUSE    pt should have NO controlled substances rx'ed  . Glaucoma   . HEPATITIS C    chronic  . Hypertension 02/18/2011  . 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 Vitiligo     Patient Active Problem List   Diagnosis Date Noted  . Hypoglycemia 12/24/2017  . Syncope 12/24/2017  . Syncope and collapse 12/23/2017  . IV drug abuse ([place]) 04/19/2017  . Acute encephalopathy 04/19/2017  . Hemangioma 10/02/2016  . Foreign body (FB) in soft tissue   . 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[place])   . Gangrene ([place])   . Type 1 diabetes mellitus with hyperosmolarity without nonketotic hyperglycemic hyperosmolar coma ([place])   . Tobacco abuse   . Essential hypertension   . Diabetic infection of right foot ([place]) 07/22/2016  . Diabetic foot infection ([place]) 07/22/2016  . Chronic ulcer of heel, right, with fat layer exposed ([place]) 05/20/2016  . DKA, type 1 ([place]) 04/18/2016  . Acute kidney injury ([place]) 04/18/2016  . Hyponatremia 04/18/2016  . Severe protein-calorie  malnutrition ([place]) 04/18/2016  . Elevated troponin 04/18/2016  . Poor dentition 07/12/2015  . Tobacco use 07/06/2013  . PAD (peripheral artery disease) ([place]) 04/29/2013  . COPD, mild ([place])   . 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 Diabetic neuropathy ([place]) 06/21/2010  . PROLIFERATIVE DIABETIC RETINOPATHY 06/21/2010  . SKIN TAG 01/30/2010  . Chronic hepatitis C ([place]) 11/22/2008  . VITILIGO 11/22/2008  . PROTEINURIA, MILD 11/22/2008  . Substance abuse ([place]) 04/23/2007  . DEPRESSION 11/11/2006    Past Surgical History:  Procedure Laterality Date  . ABDOMINAL AORTOGRAM W/LOWER EXTREMITY N/A 07/25/2016   Procedure: Abdominal Aortogram w/Bilateral Lower Extremity [place];  Surgeon: Conrad Clark [place], MD;  Location: [facility];  Service: Cardiovascular;  Laterality: N/A;  . ABDOMINAL AORTOGRAM W/LOWER EXTREMITY N/A 12/24/2016   Procedure: ABDOMINAL AORTOGRAM W/LOWER EXTREMITY;  Surgeon: Serafina Mitchell, MD;  Location: [facility];  Service: Cardiovascular;  Laterality: N/A;  . AMPUTATION TOE Right 07/26/2016   Procedure: AMPUTATION GREAT TOE;  Surgeon: Serafina Mitchell, MD;  Location: [facility];  Service: Vascular;  Laterality: Right;  . EYE SURGERY     retinal surgery x 2, right eye  . INSERTION OF ILIAC STENT Right 07/26/2016   Procedure: INSERTION OF RIGHT POPLITEAL STENT WITH BALLOON ANGIOPLASTY;  Surgeon: Serafina Mitchell, MD;  Location: [facility]  OR;  Service: Vascular;  Laterality: Right;  . LOWER EXTREMITY ANGIOGRAM Right 07/26/2016   Procedure: LOWER EXTREMITY ANGIOGRAM;  Surgeon: Serafina Mitchell, MD;  Location: [facility];  Service: Vascular;  Laterality: Right;  . PERIPHERAL VASCULAR BALLOON ANGIOPLASTY Right 07/26/2016   Procedure: PERIPHERAL VASCULAR BALLOON ANGIOPLASTY  RIGHT ANTERIOR TIBEAL ARTERY AND RIGHT SUPERFICIAL FEMORAL ARTERY;  Surgeon: Serafina Mitchell, MD;  Location: [facility];  Service: Vascular;  Laterality: Right;  . PERIPHERAL VASCULAR  INTERVENTION Right 12/24/2016   Procedure: PERIPHERAL VASCULAR INTERVENTION;  Surgeon: Serafina Mitchell, MD;  Location: [facility];  Service: Cardiovascular;  Laterality: Right;  lower extr        Home Medications    Prior to Admission medications   Medication Sig Start Date End Date Taking? Authorizing Provider  insulin NPH Human (HUMULIN N,NOVOLIN N) 100 UNIT/ML injection Inject 0.15 mLs (15 Units total) into the skin daily before breakfast. 02/01/18  Yes Lady Deutscher, MD  amLODipine (NORVASC) 10 MG tablet Take 1 tablet (10 mg total) by mouth daily. Patient not taking: Reported on 02/02/2018 02/01/18 03/03/18  Lady Deutscher, MD  aspirin EC 81 MG EC tablet Take 1 tablet (81 mg total) by mouth daily. Patient not taking: Reported on 02/02/2018 07/30/16   Lavina Hamman, MD  blood glucose meter kit and supplies KIT Dispense based on patient and insurance preference. Use up to four times daily as directed. (FOR ICD-9 250.00, 250.01). 12/24/17   Elgergawy, Silver Huguenin, MD  folic acid (FOLVITE) 1 MG tablet Take 1 tablet (1 mg total) by mouth daily. Patient not taking: Reported on 02/02/2018 02/01/18 03/03/18  Lady Deutscher, MD  Multiple Vitamin (MULTIVITAMIN WITH MINERALS) TABS tablet Take 1 tablet by mouth daily. Patient not taking: Reported on 02/02/2018 02/01/18 05/02/18  Lady Deutscher, MD  nicotine (NICODERM CQ - DOSED IN MG/24 HOURS) 14 mg/24hr patch Place 1 patch (14 mg total) onto the skin daily. Patient not taking: Reported on 02/02/2018 02/01/18   Lady Deutscher, MD  thiamine 100 MG tablet Take 1 tablet (100 mg total) by mouth daily. Patient not taking: Reported on 02/02/2018 02/01/18 05/02/18  Lady Deutscher, MD    Family History Family History  Problem Relation Age of Onset  . Diabetes Father   . Kidney disease Father   . Arthritis Mother   . Heart disease Mother        CAD    Social History Social History   Tobacco Use  . Smoking status:  Current Every Day Smoker    Packs/day: 1.00    Types: Cigarettes  . Smokeless tobacco: Never Used  Substance Use Topics  . Alcohol use: No    Alcohol/week: 0.0 standard drinks    Comment: 08/14/14 none  . Drug use: Yes    Types: "Crack" cocaine, Marijuana, Heroin    Comment: s/p rehab x 6, occasional drug use (per pt last  use was 06/2015)     Allergies   Codeine   Review of Systems Review of Systems  All systems reviewed and negative, other than as noted in HPI.  Physical Exam Updated Vital Signs BP (!) 171/94 (BP Location: Left Arm)   Pulse 87   Temp 97.9 F (36.6 C) (Oral)   Resp 18   SpO2 99%   Physical Exam  Constitutional: He appears well-developed and well-nourished. No distress.  HENT:  Head: Normocephalic and atraumatic.  Eyes: Conjunctivae are normal. Right eye exhibits no discharge. Left  eye exhibits no discharge.  Neck: Neck supple.  Cardiovascular: Normal rate, regular rhythm and normal heart sounds. Exam reveals no gallop and no friction rub.  No murmur heard. Pulmonary/Chest: Effort normal and breath sounds normal. No respiratory distress.  Abdominal: Soft. He exhibits no distension. There is tenderness.  Left abdominal tenderness without rebound or guarding.  No distention. no mass.  Musculoskeletal: He exhibits no edema or tenderness.  Neurological: He is alert.  Skin: Skin is warm and dry.  Psychiatric: He has a normal mood and affect. His behavior is normal. Thought content normal.  Nursing note and vitals reviewed.    ED Treatments / Results  Labs (all labs ordered are listed, but only abnormal results are displayed) Labs Reviewed  BASIC METABOLIC PANEL - Abnormal; Notable for the following components:      Result Value   Sodium 134 (*)    Glucose, Bld 572 (*)    BUN 29 (*)    Creatinine, Ser 1.40 (*)    GFR calc non Af Amer 53 (*)    All other components within normal limits  CBC - Abnormal; Notable for the following components:   WBC  10.9 (*)    All other components within normal limits  CBG MONITORING, ED - Abnormal; Notable for the following components:   Glucose-Capillary 478 (*)    All other components within normal limits  CBG MONITORING, ED - Abnormal; Notable for the following components:   Glucose-Capillary 539 (*)    All other components within normal limits  URINALYSIS, ROUTINE W REFLEX MICROSCOPIC  CBG MONITORING, ED    EKG None  Radiology No results found.  Procedures Procedures (including critical care time)  Medications Ordered in ED Medications  lactated ringers bolus 1,000 mL (1,000 mLs Intravenous New Bag/Given 02/02/18 1430)  insulin aspart (novoLOG) injection 10 Units (10 Units Intravenous Given 02/02/18 1432)  HYDROmorphone (DILAUDID) injection 0.5 mg (0.5 mg Intravenous Given 02/02/18 1453)     Initial Impression / Assessment and Plan / ED Course  I have reviewed the triage vital signs and the nursing notes.  Pertinent labs & imaging results that were available during my care of the patient were reviewed by me and considered in my medical decision making (see chart for details).     62 year old male with multiple complaints.  Hyperglycemia related to noncompliance.  He is not acidotic.  He was given IV fluids and insulin.  He does seem fairly tender in his left abdomen on exam.  Will CT. His leg pain is chronic. I recently saw for the same and the one that ordered him crutches. He denies acute injury or change in symptoms.   Of note, pt is extremely uncooperative and verbally abusive to multiple staff members, throwing his crutches, etc.   Final Clinical Impressions(s) / ED Diagnoses   Final diagnoses:  Hyperglycemia  Abdominal pain, unspecified abdominal location    ED Discharge Orders    None       Virgel Manifold, MD 02/03/18 [phone]

## 2018-02-02 NOTE — Telephone Encounter (Signed)
Called pt to verify appt that was made for 02/03/18 for hosp f/u. Pt was aware of appt inform him had some additional questions concerning the discharge. Completed TCM call below.[name]  Transition Care Management Follow-up Telephone Call   Date discharged? 02/01/18   How have you been since you were released from the hospital? Pt states he is alright   Do you understand why you were in the hospital? YES   Do you understand the discharge instructions? YES   Where were you discharged to? Home   Items Reviewed:  Medications reviewed: YES  Allergies reviewed: YES  Dietary changes reviewed: YES, diabetic and heart healthy  Referrals reviewed: No referral recommended   Functional Questionnaire:   Activities of Daily Living (ADLs):   He states he are independent in the following: ambulation, bathing and hygiene, feeding, continence, grooming, toileting and dressing States he doesn't require assistance    Any transportation issues/concerns?: NO   Any patient concerns? YES   Confirmed importance and date/time of follow-up visits scheduled YES, appt 02/03/18  Provider Appointment booked with Dr.Burns  Confirmed with patient if condition begins to worsen call PCP or go to the ER.  Patient was given the office number and encouraged to call back with question or concerns.  : YES

## 2018-02-02 NOTE — ED Provider Notes (Signed)
Patient accepted at signout from Dr. Wilson Singer.  Plan to follow-up on CT abdomen and if negative plan for discharge.  Patient has multiple chronic medical problems.  CT no acute findings.  Blood sugar has normalized with treatment.  I enter the room to review his results, patient is standing at bedside using the urinal.  He is alert and in no distress.  Clinically stable in appearance and appropriate for discharge.   Charlesetta Shanks, MD 02/02/18 1731

## 2018-02-02 NOTE — ED Notes (Signed)
Pt threw crutches on the ground and claimed he told the RN to take them. RN and NT assisted pt back to bed and told him it was inappropriate to throw crutches. Pt cursed at RN and NT saying we were "Staring at him like 'duh' when he told us to take them from him" RN left the situation.

## 2018-02-02 NOTE — ED Notes (Signed)
Date and time results received: 02/02/18 14:46  Test: Glucose  Critical Value: 572  Name of Provider Notified: Dr. Eugenio Hoes   Orders Received? Or Actions Taken?: Will continue to monitor and await for new orders.

## 2018-02-03 ENCOUNTER — Inpatient Hospital Stay (HOSPITAL_COMMUNITY)
Admission: EM | Admit: 2018-02-03 | Discharge: 2018-02-16 | DRG: 637 | Disposition: A | Discharge status: Skilled Nursing Facility | Payer: Medicare HMO | Attending: Internal Medicine | Admitting: Internal Medicine

## 2018-02-03 ENCOUNTER — Emergency Department (HOSPITAL_COMMUNITY): Payer: Medicare HMO

## 2018-02-03 ENCOUNTER — Inpatient Hospital Stay (HOSPITAL_COMMUNITY): Payer: Medicare HMO

## 2018-02-03 ENCOUNTER — Inpatient Hospital Stay: Payer: Medicare HMO | Admitting: Internal Medicine

## 2018-02-03 ENCOUNTER — Encounter (HOSPITAL_COMMUNITY): Payer: Self-pay | Admitting: Emergency Medicine

## 2018-02-03 DIAGNOSIS — M6281 Muscle weakness (generalized): Secondary | ICD-10-CM | POA: Diagnosis not present

## 2018-02-03 DIAGNOSIS — E10649 Type 1 diabetes mellitus with hypoglycemia without coma: Secondary | ICD-10-CM | POA: Diagnosis present

## 2018-02-03 DIAGNOSIS — T07XXXA Unspecified multiple injuries, initial encounter: Secondary | ICD-10-CM | POA: Diagnosis not present

## 2018-02-03 DIAGNOSIS — H409 Unspecified glaucoma: Secondary | ICD-10-CM | POA: Diagnosis present

## 2018-02-03 DIAGNOSIS — Z7982 Long term (current) use of aspirin: Secondary | ICD-10-CM

## 2018-02-03 DIAGNOSIS — D638 Anemia in other chronic diseases classified elsewhere: Secondary | ICD-10-CM | POA: Diagnosis present

## 2018-02-03 DIAGNOSIS — R1110 Vomiting, unspecified: Secondary | ICD-10-CM

## 2018-02-03 DIAGNOSIS — J690 Pneumonitis due to inhalation of food and vomit: Secondary | ICD-10-CM | POA: Diagnosis not present

## 2018-02-03 DIAGNOSIS — I10 Essential (primary) hypertension: Secondary | ICD-10-CM | POA: Diagnosis present

## 2018-02-03 DIAGNOSIS — N179 Acute kidney failure, unspecified: Secondary | ICD-10-CM | POA: Diagnosis not present

## 2018-02-03 DIAGNOSIS — D72829 Elevated white blood cell count, unspecified: Secondary | ICD-10-CM | POA: Diagnosis present

## 2018-02-03 DIAGNOSIS — B1920 Unspecified viral hepatitis C without hepatic coma: Secondary | ICD-10-CM | POA: Diagnosis present

## 2018-02-03 DIAGNOSIS — E1110 Type 2 diabetes mellitus with ketoacidosis without coma: Secondary | ICD-10-CM | POA: Diagnosis present

## 2018-02-03 DIAGNOSIS — L80 Vitiligo: Secondary | ICD-10-CM | POA: Diagnosis present

## 2018-02-03 DIAGNOSIS — I248 Other forms of acute ischemic heart disease: Secondary | ICD-10-CM | POA: Diagnosis not present

## 2018-02-03 DIAGNOSIS — F10231 Alcohol dependence with withdrawal delirium: Secondary | ICD-10-CM | POA: Diagnosis not present

## 2018-02-03 DIAGNOSIS — G92 Toxic encephalopathy: Secondary | ICD-10-CM | POA: Diagnosis not present

## 2018-02-03 DIAGNOSIS — J449 Chronic obstructive pulmonary disease, unspecified: Secondary | ICD-10-CM | POA: Diagnosis present

## 2018-02-03 DIAGNOSIS — S199XXA Unspecified injury of neck, initial encounter: Secondary | ICD-10-CM | POA: Diagnosis not present

## 2018-02-03 DIAGNOSIS — W1839XA Other fall on same level, initial encounter: Secondary | ICD-10-CM | POA: Diagnosis present

## 2018-02-03 DIAGNOSIS — I21A1 Myocardial infarction type 2: Secondary | ICD-10-CM | POA: Diagnosis not present

## 2018-02-03 DIAGNOSIS — Z885 Allergy status to narcotic agent status: Secondary | ICD-10-CM

## 2018-02-03 DIAGNOSIS — R509 Fever, unspecified: Secondary | ICD-10-CM

## 2018-02-03 DIAGNOSIS — R1312 Dysphagia, oropharyngeal phase: Secondary | ICD-10-CM | POA: Diagnosis not present

## 2018-02-03 DIAGNOSIS — Z95828 Presence of other vascular implants and grafts: Secondary | ICD-10-CM

## 2018-02-03 DIAGNOSIS — Z9119 Patient's noncompliance with other medical treatment and regimen: Secondary | ICD-10-CM

## 2018-02-03 DIAGNOSIS — W19XXXA Unspecified fall, initial encounter: Secondary | ICD-10-CM | POA: Diagnosis not present

## 2018-02-03 DIAGNOSIS — E1051 Type 1 diabetes mellitus with diabetic peripheral angiopathy without gangrene: Secondary | ICD-10-CM | POA: Diagnosis present

## 2018-02-03 DIAGNOSIS — S0081XA Abrasion of other part of head, initial encounter: Secondary | ICD-10-CM | POA: Diagnosis not present

## 2018-02-03 DIAGNOSIS — Z8249 Family history of ischemic heart disease and other diseases of the circulatory system: Secondary | ICD-10-CM

## 2018-02-03 DIAGNOSIS — E1010 Type 1 diabetes mellitus with ketoacidosis without coma: Principal | ICD-10-CM | POA: Diagnosis present

## 2018-02-03 DIAGNOSIS — E861 Hypovolemia: Secondary | ICD-10-CM | POA: Diagnosis present

## 2018-02-03 DIAGNOSIS — Z794 Long term (current) use of insulin: Secondary | ICD-10-CM

## 2018-02-03 DIAGNOSIS — Z89411 Acquired absence of right great toe: Secondary | ICD-10-CM

## 2018-02-03 DIAGNOSIS — R109 Unspecified abdominal pain: Secondary | ICD-10-CM | POA: Diagnosis not present

## 2018-02-03 DIAGNOSIS — R262 Difficulty in walking, not elsewhere classified: Secondary | ICD-10-CM | POA: Diagnosis not present

## 2018-02-03 DIAGNOSIS — R55 Syncope and collapse: Secondary | ICD-10-CM | POA: Diagnosis not present

## 2018-02-03 DIAGNOSIS — Z79899 Other long term (current) drug therapy: Secondary | ICD-10-CM

## 2018-02-03 DIAGNOSIS — R0681 Apnea, not elsewhere classified: Secondary | ICD-10-CM | POA: Diagnosis present

## 2018-02-03 DIAGNOSIS — E860 Dehydration: Secondary | ICD-10-CM | POA: Diagnosis present

## 2018-02-03 DIAGNOSIS — E876 Hypokalemia: Secondary | ICD-10-CM | POA: Diagnosis present

## 2018-02-03 DIAGNOSIS — E1040 Type 1 diabetes mellitus with diabetic neuropathy, unspecified: Secondary | ICD-10-CM | POA: Diagnosis present

## 2018-02-03 DIAGNOSIS — Z841 Family history of disorders of kidney and ureter: Secondary | ICD-10-CM

## 2018-02-03 DIAGNOSIS — M5489 Other dorsalgia: Secondary | ICD-10-CM | POA: Diagnosis not present

## 2018-02-03 DIAGNOSIS — R0600 Dyspnea, unspecified: Secondary | ICD-10-CM | POA: Diagnosis not present

## 2018-02-03 DIAGNOSIS — R112 Nausea with vomiting, unspecified: Secondary | ICD-10-CM | POA: Diagnosis not present

## 2018-02-03 DIAGNOSIS — J9811 Atelectasis: Secondary | ICD-10-CM | POA: Diagnosis not present

## 2018-02-03 DIAGNOSIS — F329 Major depressive disorder, single episode, unspecified: Secondary | ICD-10-CM | POA: Diagnosis present

## 2018-02-03 DIAGNOSIS — E870 Hyperosmolality and hypernatremia: Secondary | ICD-10-CM | POA: Diagnosis present

## 2018-02-03 DIAGNOSIS — S299XXA Unspecified injury of thorax, initial encounter: Secondary | ICD-10-CM | POA: Diagnosis not present

## 2018-02-03 DIAGNOSIS — F17210 Nicotine dependence, cigarettes, uncomplicated: Secondary | ICD-10-CM | POA: Diagnosis present

## 2018-02-03 DIAGNOSIS — E113599 Type 2 diabetes mellitus with proliferative diabetic retinopathy without macular edema, unspecified eye: Secondary | ICD-10-CM | POA: Diagnosis not present

## 2018-02-03 DIAGNOSIS — J9690 Respiratory failure, unspecified, unspecified whether with hypoxia or hypercapnia: Secondary | ICD-10-CM

## 2018-02-03 DIAGNOSIS — I160 Hypertensive urgency: Secondary | ICD-10-CM | POA: Diagnosis not present

## 2018-02-03 DIAGNOSIS — Z9114 Patient's other noncompliance with medication regimen: Secondary | ICD-10-CM

## 2018-02-03 DIAGNOSIS — R52 Pain, unspecified: Secondary | ICD-10-CM | POA: Diagnosis not present

## 2018-02-03 DIAGNOSIS — G9341 Metabolic encephalopathy: Secondary | ICD-10-CM | POA: Diagnosis not present

## 2018-02-03 DIAGNOSIS — R2681 Unsteadiness on feet: Secondary | ICD-10-CM | POA: Diagnosis not present

## 2018-02-03 DIAGNOSIS — E103599 Type 1 diabetes mellitus with proliferative diabetic retinopathy without macular edema, unspecified eye: Secondary | ICD-10-CM | POA: Diagnosis present

## 2018-02-03 DIAGNOSIS — Z23 Encounter for immunization: Secondary | ICD-10-CM

## 2018-02-03 DIAGNOSIS — S3991XA Unspecified injury of abdomen, initial encounter: Secondary | ICD-10-CM | POA: Diagnosis not present

## 2018-02-03 DIAGNOSIS — E1165 Type 2 diabetes mellitus with hyperglycemia: Secondary | ICD-10-CM | POA: Diagnosis not present

## 2018-02-03 DIAGNOSIS — Z560 Unemployment, unspecified: Secondary | ICD-10-CM

## 2018-02-03 DIAGNOSIS — Z833 Family history of diabetes mellitus: Secondary | ICD-10-CM

## 2018-02-03 DIAGNOSIS — E1065 Type 1 diabetes mellitus with hyperglycemia: Secondary | ICD-10-CM | POA: Diagnosis not present

## 2018-02-03 LAB — CBC WITH DIFFERENTIAL/PLATELET
BAND NEUTROPHILS: 3 %
BASOS ABS: 0.3 10*3/uL — AB (ref 0.0–0.1)
Basophils Relative: 1 %
Eosinophils Absolute: 0 10*3/uL (ref 0.0–0.5)
Eosinophils Relative: 0 %
HCT: 37.5 % — ABNORMAL LOW (ref 39.0–52.0)
Hemoglobin: 11.5 g/dL — ABNORMAL LOW (ref 13.0–17.0)
LYMPHS PCT: 5 %
Lymphs Abs: 1.3 10*3/uL (ref 0.7–4.0)
MCH: 30.2 pg (ref 26.0–34.0)
MCHC: 30.7 g/dL (ref 30.0–36.0)
MCV: 98.4 fL (ref 80.0–100.0)
MONOS PCT: 2 %
Monocytes Absolute: 0.5 10*3/uL (ref 0.1–1.0)
Myelocytes: 4 %
NEUTROS ABS: 24.4 10*3/uL — AB (ref 1.7–7.7)
Neutrophils Relative %: 85 %
Platelets: 426 10*3/uL — ABNORMAL HIGH (ref 150–400)
RBC: 3.81 MIL/uL — AB (ref 4.22–5.81)
RDW: 13.3 % (ref 11.5–15.5)
WBC: 26.5 10*3/uL — AB (ref 4.0–10.5)
nRBC: 0 % (ref 0.0–0.2)
nRBC: 0 /100 WBC

## 2018-02-03 LAB — COMPREHENSIVE METABOLIC PANEL
ALT: 23 U/L (ref 0–44)
ANION GAP: 33 — AB (ref 5–15)
AST: 27 U/L (ref 15–41)
Albumin: 3.1 g/dL — ABNORMAL LOW (ref 3.5–5.0)
Alkaline Phosphatase: 115 U/L (ref 38–126)
BUN: 53 mg/dL — ABNORMAL HIGH (ref 8–23)
CHLORIDE: 89 mmol/L — AB (ref 98–111)
CO2: 13 mmol/L — AB (ref 22–32)
Calcium: 8.8 mg/dL — ABNORMAL LOW (ref 8.9–10.3)
Creatinine, Ser: 3.13 mg/dL — ABNORMAL HIGH (ref 0.61–1.24)
GFR calc Af Amer: 23 mL/min — ABNORMAL LOW (ref >60)
GFR calc non Af Amer: 20 mL/min — ABNORMAL LOW (ref >60)
Glucose, Bld: 1082 mg/dL (ref 70–99)
POTASSIUM: 5 mmol/L (ref 3.5–5.1)
SODIUM: 135 mmol/L (ref 135–145)
Total Bilirubin: 2 mg/dL — ABNORMAL HIGH (ref 0.3–1.2)
Total Protein: 6.5 g/dL (ref 6.5–8.1)

## 2018-02-03 LAB — URINALYSIS, ROUTINE W REFLEX MICROSCOPIC
BILIRUBIN URINE: NEGATIVE
Bacteria, UA: NONE SEEN
Glucose, UA: >=500 mg/dL — AB
KETONES UR: 20 mg/dL — AB
LEUKOCYTES UA: NEGATIVE
NITRITE: NEGATIVE
Protein, ur: 100 mg/dL — AB
Specific Gravity, Urine: 1.02 (ref 1.005–1.030)
pH: 5 (ref 5.0–8.0)

## 2018-02-03 LAB — SODIUM, URINE, RANDOM: Sodium, Ur: 17 mmol/L

## 2018-02-03 LAB — RAPID URINE DRUG SCREEN, HOSP PERFORMED
Amphetamines: NOT DETECTED
Barbiturates: NOT DETECTED
Benzodiazepines: NOT DETECTED
Cocaine: NOT DETECTED
Opiates: NOT DETECTED
Tetrahydrocannabinol: NOT DETECTED

## 2018-02-03 LAB — CREATININE, URINE, RANDOM: Creatinine, Urine: 26.39 mg/dL

## 2018-02-03 LAB — CBG MONITORING, ED
Glucose-Capillary: >600 mg/dL (ref 70–99)
Glucose-Capillary: >600 mg/dL (ref 70–99)

## 2018-02-03 MED ORDER — LACTATED RINGERS IV BOLUS
1000.0000 mL | Freq: Once | INTRAVENOUS | Status: AC
  Administered 2018-02-03: 1000 mL via INTRAVENOUS

## 2018-02-03 MED ORDER — ACETAMINOPHEN 325 MG PO TABS: 650.0000 mg | ORAL_TABLET | Freq: Four times a day (QID) | ORAL | Status: DC | PRN

## 2018-02-03 MED ORDER — LORAZEPAM 2 MG/ML IJ SOLN
1.0000 mg | Freq: Four times a day (QID) | INTRAMUSCULAR | Status: DC | PRN
  Administered 2018-02-04 (×2): 2 mg via INTRAVENOUS
  Filled 2018-02-03 (×2): qty 1

## 2018-02-03 MED ORDER — AMLODIPINE BESYLATE 5 MG PO TABS: 10.0000 mg | ORAL_TABLET | Freq: Every day | ORAL | Status: DC

## 2018-02-03 MED ORDER — THIAMINE HCL 100 MG/ML IJ SOLN
100.0000 mg | Freq: Every day | INTRAMUSCULAR | Status: DC
  Administered 2018-02-05: 100 mg via INTRAVENOUS

## 2018-02-03 MED ORDER — FAMOTIDINE 20 MG PO TABS
20.0000 mg | ORAL_TABLET | Freq: Every day | ORAL | Status: DC
  Filled 2018-02-03: qty 1

## 2018-02-03 MED ORDER — LORAZEPAM 1 MG PO TABS: 0.0000 mg | ORAL_TABLET | Freq: Two times a day (BID) | ORAL | Status: DC

## 2018-02-03 MED ORDER — ADULT MULTIVITAMIN W/MINERALS CH
1.0000 | ORAL_TABLET | Freq: Every day | ORAL | Status: DC
  Filled 2018-02-03: qty 1

## 2018-02-03 MED ORDER — FOLIC ACID 1 MG PO TABS
1.0000 mg | ORAL_TABLET | Freq: Every day | ORAL | Status: DC
  Filled 2018-02-03: qty 1

## 2018-02-03 MED ORDER — INSULIN REGULAR(HUMAN) IN NACL 100-0.9 UT/100ML-% IV SOLN: INTRAVENOUS | Status: DC

## 2018-02-03 MED ORDER — FENTANYL CITRATE (PF) 100 MCG/2ML IJ SOLN
50.0000 ug | Freq: Once | INTRAMUSCULAR | Status: AC
  Administered 2018-02-03: 50 ug via INTRAMUSCULAR
  Filled 2018-02-03: qty 2

## 2018-02-03 MED ORDER — INSULIN ASPART 100 UNIT/ML ~~LOC~~ SOLN
10.0000 [IU] | Freq: Once | SUBCUTANEOUS | Status: AC
  Administered 2018-02-03: 10 [IU] via INTRAVENOUS
  Filled 2018-02-03: qty 1

## 2018-02-03 MED ORDER — NICOTINE 14 MG/24HR TD PT24
14.0000 mg | MEDICATED_PATCH | Freq: Every day | TRANSDERMAL | Status: DC
  Administered 2018-02-04 – 2018-02-16 (×12): 14 mg via TRANSDERMAL
  Filled 2018-02-03 (×14): qty 1

## 2018-02-03 MED ORDER — HYDROCODONE-ACETAMINOPHEN 5-325 MG PO TABS
1.0000 | ORAL_TABLET | ORAL | Status: DC | PRN
  Administered 2018-02-03: 1 via ORAL
  Filled 2018-02-03: qty 1

## 2018-02-03 MED ORDER — LORAZEPAM 1 MG PO TABS
0.0000 mg | ORAL_TABLET | Freq: Four times a day (QID) | ORAL | Status: DC
  Filled 2018-02-03: qty 1

## 2018-02-03 MED ORDER — HEPARIN SODIUM (PORCINE) 5000 UNIT/ML IJ SOLN
5000.0000 [IU] | Freq: Three times a day (TID) | INTRAMUSCULAR | Status: DC
  Administered 2018-02-04 – 2018-02-16 (×34): 5000 [IU] via SUBCUTANEOUS
  Filled 2018-02-03 (×35): qty 1

## 2018-02-03 MED ORDER — SODIUM CHLORIDE 0.9 % IV SOLN
INTRAVENOUS | Status: DC
  Administered 2018-02-04: 02:00:00 via INTRAVENOUS

## 2018-02-03 MED ORDER — ONDANSETRON HCL 4 MG/2ML IJ SOLN
4.0000 mg | Freq: Four times a day (QID) | INTRAMUSCULAR | Status: DC | PRN
  Administered 2018-02-03: 4 mg via INTRAVENOUS
  Filled 2018-02-03: qty 2

## 2018-02-03 MED ORDER — VITAMIN B-1 100 MG PO TABS
100.0000 mg | ORAL_TABLET | Freq: Every day | ORAL | Status: DC
  Filled 2018-02-03: qty 1

## 2018-02-03 MED ORDER — INSULIN REGULAR(HUMAN) IN NACL 100-0.9 UT/100ML-% IV SOLN
INTRAVENOUS | Status: DC
  Administered 2018-02-03: 100 [IU] via INTRAVENOUS
  Filled 2018-02-03: qty 100

## 2018-02-03 MED ORDER — SODIUM CHLORIDE 0.9 % IV BOLUS
1000.0000 mL | Freq: Once | INTRAVENOUS | Status: AC
  Administered 2018-02-04: 1000 mL via INTRAVENOUS

## 2018-02-03 MED ORDER — DEXTROSE-NACL 5-0.45 % IV SOLN
INTRAVENOUS | Status: DC
  Administered 2018-02-04: 06:00:00 via INTRAVENOUS

## 2018-02-03 MED ORDER — PANTOPRAZOLE SODIUM 40 MG IV SOLR: 40.0000 mg | Freq: Two times a day (BID) | INTRAVENOUS | Status: DC

## 2018-02-03 MED ORDER — ASPIRIN EC 81 MG PO TBEC: 81.0000 mg | DELAYED_RELEASE_TABLET | Freq: Every day | ORAL | Status: DC

## 2018-02-03 MED ORDER — LORAZEPAM 1 MG PO TABS: 1.0000 mg | ORAL_TABLET | Freq: Four times a day (QID) | ORAL | Status: DC | PRN

## 2018-02-03 MED ORDER — DEXTROSE-NACL 5-0.45 % IV SOLN: INTRAVENOUS | Status: DC

## 2018-02-03 NOTE — ED Notes (Signed)
Dr. Dayna Barker made aware of glucose

## 2018-02-03 NOTE — ED Notes (Signed)
Called ultrasound to advise pt has been given additional pain medicine and nausea medicine.  Ultrasound advised they will have to do a portable now.

## 2018-02-03 NOTE — ED Provider Notes (Signed)
Emergency Department Provider Note   I have reviewed the triage vital signs and the nursing notes.   HISTORY  Chief Complaint Fall and Back Pain   HPI [name] is a 62 y.o. male who was recently admitted to the hospital and worked up for syncope and discharged but then seen yesterday in the emergency department for hyperglycemia but not DKA has not been take his medications and 2 at home and had a fall today which suffered some abrasions to his forehead.  Came here for further evaluation.  No other associated symptoms at this time besides pain from the fall. No other associated or modifying symptoms.    Past Medical History:  Diagnosis Date  . DEPRESSION   . DIABETES MELLITUS, TYPE I   . DRUG ABUSE    pt should have NO controlled substances rx'ed  . Glaucoma   . HEPATITIS C    chronic  . Hypertension 02/18/2011  . 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 Vitiligo     Patient Active Problem List   Diagnosis Date Noted  . DKA (diabetic ketoacidoses) ([place]) 02/03/2018  . Leukocytosis 02/03/2018  . Hypoglycemia 12/24/2017  . Syncope 12/24/2017  . Syncope and collapse 12/23/2017  . IV drug abuse ([place]) 04/19/2017  . Acute encephalopathy 04/19/2017  . Hemangioma 10/02/2016  . Foreign body (FB) in soft tissue   . 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[place])   . Gangrene ([place])   . Type 1 diabetes mellitus with hyperosmolarity without nonketotic hyperglycemic hyperosmolar coma ([place])   . Tobacco abuse   . Essential hypertension   . Diabetic infection of right foot ([place]) 07/22/2016  . Diabetic foot infection ([place]) 07/22/2016  . Chronic ulcer of heel, right, with fat layer exposed ([place]) 05/20/2016  . DKA, type 1 ([place]) 04/18/2016  . Acute kidney injury ([place]) 04/18/2016  . Hyponatremia 04/18/2016  . Severe protein-calorie malnutrition ([place]) 04/18/2016  . Elevated troponin 04/18/2016  . Poor dentition 07/12/2015  . Tobacco use 07/06/2013  . PAD (peripheral artery  disease) ([place]) 04/29/2013  . COPD, mild ([place])   . 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 Diabetic neuropathy ([place]) 06/21/2010  . PROLIFERATIVE DIABETIC RETINOPATHY 06/21/2010  . SKIN TAG 01/30/2010  . Chronic hepatitis C ([place]) 11/22/2008  . VITILIGO 11/22/2008  . PROTEINURIA, MILD 11/22/2008  . Substance abuse ([place]) 04/23/2007  . DEPRESSION 11/11/2006    Past Surgical History:  Procedure Laterality Date  . ABDOMINAL AORTOGRAM W/LOWER EXTREMITY N/A 07/25/2016   Procedure: Abdominal Aortogram w/Bilateral Lower Extremity Runoff;  Surgeon: Conrad [place], MD;  Location: [facility];  Service: Cardiovascular;  Laterality: N/A;  . ABDOMINAL AORTOGRAM W/LOWER EXTREMITY N/A 12/24/2016   Procedure: ABDOMINAL AORTOGRAM W/LOWER EXTREMITY;  Surgeon: Serafina Mitchell, MD;  Location: [facility];  Service: Cardiovascular;  Laterality: N/A;  . AMPUTATION TOE Right 07/26/2016   Procedure: AMPUTATION GREAT TOE;  Surgeon: Serafina Mitchell, MD;  Location: [facility];  Service: Vascular;  Laterality: Right;  . EYE SURGERY     retinal surgery x 2, right eye  . INSERTION OF ILIAC STENT Right 07/26/2016   Procedure: INSERTION OF RIGHT POPLITEAL STENT WITH BALLOON ANGIOPLASTY;  Surgeon: Serafina Mitchell, MD;  Location: [facility];  Service: Vascular;  Laterality: Right;  . LOWER EXTREMITY ANGIOGRAM Right 07/26/2016   Procedure: LOWER EXTREMITY ANGIOGRAM;  Surgeon: Serafina Mitchell, MD;  Location: [facility];  Service: Vascular;  Laterality: Right;  .  PERIPHERAL VASCULAR BALLOON ANGIOPLASTY Right 07/26/2016   Procedure: PERIPHERAL VASCULAR BALLOON ANGIOPLASTY  RIGHT ANTERIOR TIBEAL ARTERY AND RIGHT SUPERFICIAL FEMORAL ARTERY;  Surgeon: Serafina Mitchell, MD;  Location: [facility];  Service: Vascular;  Laterality: Right;  . PERIPHERAL VASCULAR INTERVENTION Right 12/24/2016   Procedure: PERIPHERAL VASCULAR INTERVENTION;  Surgeon: Serafina Mitchell, MD;  Location: [facility];   Service: Cardiovascular;  Laterality: Right;  lower extr    Current Outpatient Rx  . Order #: [id number] Class: Normal  . Order #: [id number] Class: Normal  . Order #: [id number] Class: Normal  . Order #: [id number] Class: Print  . Order #: [id number] Class: Normal  . Order #: [id number] Class: Print  . Order #: [id number] Class: Normal  . Order #: [id number] Class: Normal    Allergies Codeine  Family History  Problem Relation Age of Onset  . Diabetes Father   . Kidney disease Father   . Arthritis Mother   . Heart disease Mother        CAD    Social History Social History   Tobacco Use  . Smoking status: Current Every Day Smoker    Packs/day: 1.00    Types: Cigarettes  . Smokeless tobacco: Never Used  Substance Use Topics  . Alcohol use: No    Alcohol/week: 0.0 standard drinks    Comment: 08/14/14 none  . Drug use: Yes    Types: "Crack" cocaine, Marijuana, Heroin    Comment: s/p rehab x 6, occasional drug use (per pt last  use was 06/2015)    Review of Systems  All other systems negative except as documented in the HPI. All pertinent positives and negatives as reviewed in the HPI. ____________________________________________   PHYSICAL EXAM:  VITAL SIGNS: ED Triage Vitals  Enc Vitals Group     BP 02/03/18 1841 (!) 162/64     Pulse Rate 02/03/18 1841 (!) 101     Resp 02/03/18 1841 19     Temp 02/03/18 1841 97.6 F (36.4 C)     Temp src --      SpO2 02/03/18 1841 100 %     Weight 02/03/18 2009 157 lb 6.5 oz (71.4 kg)     Height 02/03/18 2009 6' (1.829 m)    Constitutional: Alert and oriented. Well appearing and in no acute distress. Eyes: Conjunctivae are normal. PERRL. EOMI. Head: Atraumatic. Nose: No congestion/rhinnorhea. Mouth/Throat: Mucous membranes are moist.  Oropharynx non-erythematous. Neck: No stridor.  No meningeal signs.   Cardiovascular: tachycardic rate, regular rhythm. Good peripheral circulation. Grossly normal heart sounds.   Respiratory: Normal  respiratory effort.  No retractions. Lungs CTAB. Gastrointestinal: Soft and nontender. No distention.  Musculoskeletal: No lower extremity tenderness nor edema. No gross deformities of extremities. Neurologic:  Normal speech and language. No gross focal neurologic deficits are appreciated.  Skin:  Skin is warm, dry with abrasions to face.    ____________________________________________   LABS (all labs ordered are listed, but only abnormal results are displayed)  Labs Reviewed  CBC WITH DIFFERENTIAL/PLATELET - Abnormal; Notable for the following components:      Result Value   WBC 26.5 (*)    RBC 3.81 (*)    Hemoglobin 11.5 (*)    HCT 37.5 (*)    Platelets 426 (*)    Neutro Abs 24.4 (*)    Basophils Absolute 0.3 (*)    All other components within normal limits  URINALYSIS, ROUTINE W REFLEX MICROSCOPIC - Abnormal; Notable for the following components:   Glucose, UA >=500 (*)  Hgb urine dipstick SMALL (*)    Ketones, ur 20 (*)    Protein, ur 100 (*)    All other components within normal limits  COMPREHENSIVE METABOLIC PANEL - Abnormal; Notable for the following components:   Chloride 89 (*)    CO2 13 (*)    Glucose, Bld 1,082 (*)    BUN 53 (*)    Creatinine, Ser 3.13 (*)    Calcium 8.8 (*)    Albumin 3.1 (*)    Total Bilirubin 2.0 (*)    GFR calc non Af Amer 20 (*)    GFR calc Af Amer 23 (*)    Anion gap 33 (*)    All other components within normal limits  CBG MONITORING, ED - Abnormal; Notable for the following components:   Glucose-Capillary >600 (*)    All other components within normal limits  CBG MONITORING, ED - Abnormal; Notable for the following components:   Glucose-Capillary >600 (*)    All other components within normal limits  CULTURE, BLOOD (ROUTINE X 2)  CULTURE, BLOOD (ROUTINE X 2)  RAPID URINE DRUG SCREEN, HOSP PERFORMED  SODIUM, URINE, RANDOM  CREATININE, URINE, RANDOM  UREA NITROGEN, URINE  BASIC METABOLIC PANEL  BASIC METABOLIC PANEL  BASIC  METABOLIC PANEL  BASIC METABOLIC PANEL  CBC   ____________________________________________   RADIOLOGY  Dg Chest 2 View  Result Date: 02/03/2018 CLINICAL DATA:  Syncope EXAM: CHEST - 2 VIEW COMPARISON:  01/29/2018, 12/23/2017 FINDINGS: The heart size and mediastinal contours are within normal limits. Both lungs are clear. No pneumothorax. IMPRESSION: No active cardiopulmonary disease. Electronically Signed   By: Donavan Foil M.D.   On: 02/03/2018 20:03   Ct Head Wo Contrast  Result Date: 02/03/2018 CLINICAL DATA:  Ct head/cspine wo, Pt has been walking in the rain today trying to get home after a dc from hospital, pt is cold to touch. Pt has abrasion to forehead, he fell face forwards onto cement. Pt has neck back and head pain 10/10. EXAM: CT HEAD WITHOUT CONTRAST CT CERVICAL SPINE WITHOUT CONTRAST TECHNIQUE: Multidetector CT imaging of the head and cervical spine was performed following the standard protocol without intravenous contrast. Multiplanar CT image reconstructions of the cervical spine were also generated. COMPARISON:  CT of the head on 01/30/2018 FINDINGS: CT HEAD FINDINGS Brain: There is central and cortical atrophy. Periventricular white matter changes are consistent with small vessel disease. The basilar cisterns and ventricles have a normal appearance. There is no CT evidence for acute infarction or hemorrhage. A 4 millimeter calcified lesion is identified along the INNER table of the RIGHT frontal bone, consistent with small meningioma. There is no associated edema or mass effect. The appearance is stable. Vascular: There is dense atherosclerotic calcification of the internal carotid arteries. Skull: Normal. Negative for fracture or focal lesion. Sinuses/Orbits: Soft tissue swelling, polyp, or mucous retention cyst within the LEFT maxillary sinus. Chronic deformity of the nasal bones consistent with remote fracture. Other: None CT CERVICAL SPINE FINDINGS Alignment: Normal. Skull  base and vertebrae: No acute fracture. No primary bone lesion or focal pathologic process. Soft tissues and spinal canal: There is atherosclerotic calcification of the vertebral and carotid arteries. Spinal canal is unremarkable. Disc levels:  Disc height loss and uncovertebral spurring at C6-7. Upper chest: Negative. Other: None IMPRESSION: 1. Atrophy and small vessel disease. 2.  No evidence for acute intracranial abnormality. 3. Remote fractures of the nasal bones. 4. Stable 4 millimeter calcified meningioma RIGHT frontal region not associated  with edema or mass effect. 5. Mid cervical degenerative changes without evidence for acute cervical spine fracture. Electronically Signed   By: Nolon Nations M.D.   On: 02/03/2018 20:29   Ct Cervical Spine Wo Contrast  Result Date: 02/03/2018 CLINICAL DATA:  Ct head/cspine wo, Pt has been walking in the rain today trying to get home after a dc from hospital, pt is cold to touch. Pt has abrasion to forehead, he fell face forwards onto cement. Pt has neck back and head pain 10/10. EXAM: CT HEAD WITHOUT CONTRAST CT CERVICAL SPINE WITHOUT CONTRAST TECHNIQUE: Multidetector CT imaging of the head and cervical spine was performed following the standard protocol without intravenous contrast. Multiplanar CT image reconstructions of the cervical spine were also generated. COMPARISON:  CT of the head on 01/30/2018 FINDINGS: CT HEAD FINDINGS Brain: There is central and cortical atrophy. Periventricular white matter changes are consistent with small vessel disease. The basilar cisterns and ventricles have a normal appearance. There is no CT evidence for acute infarction or hemorrhage. A 4 millimeter calcified lesion is identified along the INNER table of the RIGHT frontal bone, consistent with small meningioma. There is no associated edema or mass effect. The appearance is stable. Vascular: There is dense atherosclerotic calcification of the internal carotid arteries. Skull:  Normal. Negative for fracture or focal lesion. Sinuses/Orbits: Soft tissue swelling, polyp, or mucous retention cyst within the LEFT maxillary sinus. Chronic deformity of the nasal bones consistent with remote fracture. Other: None CT CERVICAL SPINE FINDINGS Alignment: Normal. Skull base and vertebrae: No acute fracture. No primary bone lesion or focal pathologic process. Soft tissues and spinal canal: There is atherosclerotic calcification of the vertebral and carotid arteries. Spinal canal is unremarkable. Disc levels:  Disc height loss and uncovertebral spurring at C6-7. Upper chest: Negative. Other: None IMPRESSION: 1. Atrophy and small vessel disease. 2.  No evidence for acute intracranial abnormality. 3. Remote fractures of the nasal bones. 4. Stable 4 millimeter calcified meningioma RIGHT frontal region not associated with edema or mass effect. 5. Mid cervical degenerative changes without evidence for acute cervical spine fracture. Electronically Signed   By: Nolon Nations M.D.   On: 02/03/2018 20:29    ____________________________________________   PROCEDURES  Procedure(s) performed:   Procedures  CRITICAL CARE Performed by: Merrily Pew Total critical care time: 40 minutes Critical care time was exclusive of separately billable procedures and treating other patients. Critical care was necessary to treat or prevent imminent or life-threatening deterioration. Critical care was time spent personally by me on the following activities: development of treatment plan with patient and/or surrogate as well as nursing, discussions with consultants, evaluation of patient's response to treatment, examination of patient, obtaining history from patient or surrogate, ordering and performing treatments and interventions, ordering and review of laboratory studies, ordering and review of radiographic studies, pulse oximetry and re-evaluation of patient's  condition.  ____________________________________________   INITIAL IMPRESSION / ASSESSMENT AND PLAN / ED COURSE  No traumatic injuries. Just skin abrasions. Found to have aki and DKA. Started insulin, admit to medicine.    Pertinent labs & imaging results that were available during my care of the patient were reviewed by me and considered in my medical decision making (see chart for details).  ____________________________________________  FINAL CLINICAL IMPRESSION(S) / ED DIAGNOSES  Final diagnoses:  Diabetic ketoacidosis without coma associated with type 1 diabetes mellitus ([name]  Abrasions of multiple sites  AKI (acute kidney injury) ([place])     MEDICATIONS GIVEN DURING THIS  VISIT:  Medications  aspirin EC tablet 81 mg (has no administration in time range)  amLODipine (NORVASC) tablet 10 mg (has no administration in time range)  nicotine (NICODERM CQ - dosed in mg/24 hours) patch 14 mg (has no administration in time range)  LORazepam (ATIVAN) tablet 1 mg (has no administration in time range)    Or  LORazepam (ATIVAN) injection 1 mg (has no administration in time range)  thiamine (VITAMIN B-1) tablet 100 mg (has no administration in time range)    Or  thiamine (B-1) injection 100 mg (has no administration in time range)  folic acid (FOLVITE) tablet 1 mg (has no administration in time range)  multivitamin with minerals tablet 1 tablet (has no administration in time range)  0.9 %  sodium chloride infusion (has no administration in time range)  dextrose 5 %-0.45 % sodium chloride infusion (has no administration in time range)  LORazepam (ATIVAN) tablet 0-4 mg (has no administration in time range)    Followed by  LORazepam (ATIVAN) tablet 0-4 mg (has no administration in time range)  heparin injection 5,000 Units (has no administration in time range)  sodium chloride 0.9 % bolus 1,000 mL (has no administration in time range)  insulin regular, human (MYXREDLIN) 100 units/ 100  mL infusion (10.8 Units/hr Intravenous Rate/Dose Change 02/03/18 2308)  acetaminophen (TYLENOL) tablet 650 mg (has no administration in time range)  HYDROcodone-acetaminophen (NORCO/VICODIN) 5-325 MG per tablet 1-2 tablet (1 tablet Oral Given 02/03/18 2311)  ondansetron (ZOFRAN) injection 4 mg (4 mg Intravenous Given 02/03/18 2250)  famotidine (PEPCID) tablet 20 mg (has no administration in time range)  lactated ringers bolus 1,000 mL (0 mLs Intravenous Stopped 02/03/18 2149)  insulin aspart (novoLOG) injection 10 Units (10 Units Intravenous Given 02/03/18 2007)  fentaNYL (SUBLIMAZE) injection 50 mcg (50 mcg Intramuscular Given 02/03/18 2053)     NEW OUTPATIENT MEDICATIONS STARTED DURING THIS VISIT:  New Prescriptions   No medications on file    Note:  This note was prepared with assistance of Dragon voice recognition software. Occasional wrong-word or sound-a-like substitutions may have occurred due to the inherent limitations of voice recognition software.   Merrily Pew, MD 02/03/18 [phone]

## 2018-02-03 NOTE — ED Notes (Signed)
EDP made aware of CBG >600

## 2018-02-03 NOTE — ED Notes (Signed)
Patient transported to Ultrasound 

## 2018-02-03 NOTE — ED Provider Notes (Signed)
Patient placed in Quick Look pathway, seen and evaluated   Chief Complaint: Fall, possible syncope, hyperglycemia  HPI:   [name] is a 62 y.o. male since EMS for evaluation after he was found walking in the rain with abrasions to his forehead, he reports he fell face forward onto the cement, and is now complaining of neck and head pain.  Is unsure if he tripped over something or if he passed out.  Patient was discharged from the hospital yesterday, he reports he has not taken his insulin since being discharged.  CBG noted to be greater than 600 in triage.  Patient reports he has been out in the cold, and is shaking somewhat.  Patient reports he thinks he blacked out, is a poor historian unable to provide much information for history.  Does report that today he has had several episodes of nonbloody emesis.  ROS: + Head injury, possible syncope, nausea and vomiting. -Fevers, chest pain, shortness of breath  Physical Exam:   Gen: No distress, patient shaking somewhat cold, but no evidence of seizure-like activity, he is alert and responsive  Neuro: Awake and Alert  Skin: Warm    Focused Exam: Heart with regular rate and rhythm, lungs clear to auscultation  Initiation of care has begun. The patient has been counseled on the process, plan, and necessity for staying for the completion/evaluation, and the remainder of the medical screening examination    Janet [place] 02/03/18 1940    Mesner, Corene Cornea, MD 02/03/18 2355

## 2018-02-03 NOTE — ED Notes (Signed)
Pt eating snacks and drinking sodas from a cooler at bedside, educated pt on diabetes and diet. Pt loudly responded "I KNOW" Dr. Dayna Barker aware and spoke to pt as well.

## 2018-02-03 NOTE — ED Notes (Signed)
Unable to obtain EKG due to pt shaking. Warm blankets placed on pt and PA at bedside.

## 2018-02-03 NOTE — ED Triage Notes (Addendum)
Per EMS- Pt has been walking in the rain today trying to get home after a dc from hospital, pt is cold to touch. Pt has abrasion to forehead, he fell face forwards onto cement. Pt has neck back and head pain 10/10. Pt has full ROM to legs. Has some abrasions to legs. No LOC. CBG 269. C collar in place. 160/82 HR 105 R 24 O2 96. Pt shivering. He has warming blankets on. Pt states he thinks he blacked out.

## 2018-02-03 NOTE — H&P (Signed)
History and Physical    [name] DOB: February 08, 1956 DOA: 02/03/2018  PCP: [name], MD   Patient coming from: Home   Chief Complaint: Fall, "might have blacked out"   HPI: [name] is a 62 y.o. male with medical history significant for polysubstance abuse including history of IV drug abuse, insulin-dependent diabetes mellitus, chronic hepatitis C status post treatment, peripheral arterial disease with foot ulcer, hypertension, and nonadherence to his treatment plan, now presenting to the emergency department after falling and hitting his head.  Patient reports that he became lightheaded while walking, believes he passed out, and fell.  He reports pain to his head and neck after this.  He was just discharged from the hospital 3 days ago after admission for recurrent syncope that was likely secondary to hypovolemia.  He reports that he has not been taking his medications due to financial constraints.  He denies any chest pain or palpitations.  Reports some generalized abdominal discomfort with nausea and several episodes of vomiting.  He denies hematemesis, melena, or hematochezia.  He denies fevers, chills, cough, or dysuria.  ED Course: Upon arrival to the ED, patient is found to be afebrile, saturating well on room air, slightly tachycardic, and with stable blood pressure.  Noncontrast head CT is negative for acute intracranial abnormality and no acute cervical spine fracture is noted on CT.  Chemistry panel is notable for glucose of 1082, bicarbonate 13, anion gap 33, and creatinine of 3.13, up from 1.40 the day before.  CBC features a mild normocytic anemia, leukocytosis, and thrombocytosis.  Urinalysis is notable for glucosuria, ketonuria, and proteinuria.  Patient was given a liter of lactated Ringer's, fentanyl, and started on insulin infusion.  He remains hemodynamically stable and will be admitted for ongoing evaluation and management of DKA with acute renal failure and  possible syncopal episode.  Review of Systems:  All other systems reviewed and apart from HPI, are negative.  Past Medical History:  Diagnosis Date  . DEPRESSION   . DIABETES MELLITUS, TYPE I   . DRUG ABUSE    pt should have NO controlled substances rx'ed  . Glaucoma   . HEPATITIS C    chronic  . Hypertension 02/18/2011  . 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 Vitiligo     Past Surgical History:  Procedure Laterality Date  . ABDOMINAL AORTOGRAM W/LOWER EXTREMITY N/A 07/25/2016   Procedure: Abdominal Aortogram w/Bilateral Lower Extremity Runoff;  Surgeon: Conrad [place], MD;  Location: [facility];  Service: Cardiovascular;  Laterality: N/A;  . ABDOMINAL AORTOGRAM W/LOWER EXTREMITY N/A 12/24/2016   Procedure: ABDOMINAL AORTOGRAM W/LOWER EXTREMITY;  Surgeon: Serafina Mitchell, MD;  Location: [facility];  Service: Cardiovascular;  Laterality: N/A;  . AMPUTATION TOE Right 07/26/2016   Procedure: AMPUTATION GREAT TOE;  Surgeon: Serafina Mitchell, MD;  Location: [facility];  Service: Vascular;  Laterality: Right;  . EYE SURGERY     retinal surgery x 2, right eye  . INSERTION OF ILIAC STENT Right 07/26/2016   Procedure: INSERTION OF RIGHT POPLITEAL STENT WITH BALLOON ANGIOPLASTY;  Surgeon: Serafina Mitchell, MD;  Location: [facility];  Service: Vascular;  Laterality: Right;  . LOWER EXTREMITY ANGIOGRAM Right 07/26/2016   Procedure: LOWER EXTREMITY ANGIOGRAM;  Surgeon: Serafina Mitchell, MD;  Location: [facility];  Service: Vascular;  Laterality: Right;  . PERIPHERAL VASCULAR BALLOON ANGIOPLASTY Right 07/26/2016   Procedure: PERIPHERAL VASCULAR BALLOON ANGIOPLASTY  RIGHT ANTERIOR TIBEAL ARTERY AND RIGHT  SUPERFICIAL FEMORAL ARTERY;  Surgeon: Serafina Mitchell, MD;  Location: [facility];  Service: Vascular;  Laterality: Right;  . PERIPHERAL VASCULAR INTERVENTION Right 12/24/2016   Procedure: PERIPHERAL VASCULAR INTERVENTION;  Surgeon: Serafina Mitchell, MD;  Location: [facility];  Service:  Cardiovascular;  Laterality: Right;  lower extr     reports that he has been smoking cigarettes. He has been smoking about 1.00 pack per day. He has never used smokeless tobacco. He reports that he has current or past drug history. Drugs: "Crack" cocaine, Marijuana, and Heroin. He reports that he does not drink alcohol.  Allergies  Allergen Reactions  . Codeine Itching    Family History  Problem Relation Age of Onset  . Diabetes Father   . Kidney disease Father   . Arthritis Mother   . Heart disease Mother        CAD     Prior to Admission medications   Medication Sig Start Date End Date Taking? Authorizing Provider  insulin NPH Human (HUMULIN N,NOVOLIN N) 100 UNIT/ML injection Inject 0.15 mLs (15 Units total) into the skin daily before breakfast. 02/01/18  Yes Lady Deutscher, MD  amLODipine (NORVASC) 10 MG tablet Take 1 tablet (10 mg total) by mouth daily. Patient not taking: Reported on 02/02/2018 02/01/18 03/03/18  Lady Deutscher, MD  aspirin EC 81 MG EC tablet Take 1 tablet (81 mg total) by mouth daily. Patient not taking: Reported on 02/02/2018 07/30/16   Lavina Hamman, MD  blood glucose meter kit and supplies KIT Dispense based on patient and insurance preference. Use up to four times daily as directed. (FOR ICD-9 250.00, 250.01). Patient not taking: Reported on 02/03/2018 12/24/17   Elgergawy, Silver Huguenin, MD  folic acid (FOLVITE) 1 MG tablet Take 1 tablet (1 mg total) by mouth daily. Patient not taking: Reported on 02/02/2018 02/01/18 03/03/18  Lady Deutscher, MD  Multiple Vitamin (MULTIVITAMIN WITH MINERALS) TABS tablet Take 1 tablet by mouth daily. Patient not taking: Reported on 02/02/2018 02/01/18 05/02/18  Lady Deutscher, MD  nicotine (NICODERM CQ - DOSED IN MG/24 HOURS) 14 mg/24hr patch Place 1 patch (14 mg total) onto the skin daily. Patient not taking: Reported on 02/02/2018 02/01/18   Lady Deutscher, MD  thiamine 100 MG tablet Take 1 tablet (100 mg  total) by mouth daily. Patient not taking: Reported on 02/02/2018 02/01/18 05/02/18  Lady Deutscher, MD    Physical Exam: Vitals:   02/03/18 2004 02/03/18 2009 02/03/18 2015 02/03/18 2045  BP:   (!) 131/43 (!) 154/51  Pulse: 96  96 (!) 105  Resp:      Temp:      SpO2: 100%  100% 98%  Weight:  71.4 kg    Height:  6' (1.829 m)      Constitutional: NAD, calm, in apparent discomfort Eyes: PERTLA, lids and conjunctivae normal ENMT: Mucous membranes are dry. Posterior pharynx clear of any exudate or lesions.   Neck: normal, supple, no masses, no thyromegaly Respiratory: clear to auscultation bilaterally, no wheezing, no crackles. Normal respiratory effort.   Cardiovascular: S1 & S2 heard, regular rate and rhythm. No extremity edema. No significant JVD. Abdomen: No distension, no tenderness, no masses palpated. Bowel sounds normal.  Musculoskeletal: no clubbing / cyanosis. Status-post left great toe amputation.   Skin: Poor turgor. Sharply demarcated depigmented patches. Warm, dry, well-perfused. Neurologic: CN 2-12 grossly intact. Sensation intact. Strength 5/5 in all 4 limbs.  Psychiatric: Somnolent, easily roused. Oriented  x 3.    Labs on Admission: I have personally reviewed following labs and imaging studies  CBC: Recent Labs  Lab 01/29/18 1942 01/30/18 0922 02/02/18 1342 02/03/18 2005  WBC 9.5 13.3* 10.9* 26.5*  NEUTROABS 7.5  --   --  24.4*  HGB 13.5 12.1* 13.0 11.5*  HCT 40.4 35.7* 39.3 37.5*  MCV 90.6 89.7 91.2 98.4  PLT 350 369 322 159*   Basic Metabolic Panel: Recent Labs  Lab 01/29/18 1942 01/30/18 0418 01/30/18 0922 01/30/18 1253 01/31/18 0612 02/02/18 1342 02/03/18 2005  NA 129*  --  135  --  140 134* 135  K 5.5* 3.6 4.5  --  4.4 4.3 5.0  CL 99  --  103  --  108 100 89*  CO2 18*  --  23  --  23 25 13*  GLUCOSE 681*  --  420* 435* 47* 572* 1,082*  BUN 30*  --  20  --  18 29* 53*  CREATININE 1.54*  --  1.28*  --  1.15 1.40* 3.13*  CALCIUM 9.0  --   8.5*  --  8.8* 9.0 8.8*  MG 2.2  --  1.8  --   --   --   --   PHOS  --   --  3.2  --   --   --   --    GFR: Estimated Creatinine Clearance: 25 mL/min (A) (by C-G formula based on SCr of 3.13 mg/dL (H)). Liver Function Tests: Recent Labs  Lab 01/29/18 1942 01/31/18 0612 02/03/18 2005  AST '30 15 27  '$ ALT '22 13 23  '$ ALKPHOS 112 76 115  BILITOT 1.9* 0.3 2.0*  PROT 6.0* 5.3* 6.5  ALBUMIN 2.9* 2.3* 3.1*   Recent Labs  Lab 01/29/18 1942  LIPASE 27   No results for input(s): AMMONIA in the last 168 hours. Coagulation Profile: No results for input(s): INR, PROTIME in the last 168 hours. Cardiac Enzymes: Recent Labs  Lab 01/30/18 0922 01/30/18 1143  TROPONINI <0.03 <0.03   BNP (last 3 results) No results for input(s): PROBNP in the last 8760 hours. HbA1C: No results for input(s): HGBA1C in the last 72 hours. CBG: Recent Labs  Lab 01/31/18 1127 02/02/18 1244 02/02/18 1428 02/02/18 1613 02/03/18 1850  GLUCAP 294* 478* 539* 234* >600*   Lipid Profile: No results for input(s): CHOL, HDL, LDLCALC, TRIG, CHOLHDL, LDLDIRECT in the last 72 hours. Thyroid Function Tests: No results for input(s): TSH, T4TOTAL, FREET4, T3FREE, THYROIDAB in the last 72 hours. Anemia Panel: No results for input(s): VITAMINB12, FOLATE, FERRITIN, TIBC, IRON, RETICCTPCT in the last 72 hours. Urine analysis:    Component Value Date/Time   COLORURINE YELLOW 02/03/2018 1917   APPEARANCEUR CLEAR 02/03/2018 1917   LABSPEC 1.020 02/03/2018 1917   PHURINE 5.0 02/03/2018 1917   GLUCOSEU >=500 (A) 02/03/2018 1917   GLUCOSEU 500 04/28/2012 1149   HGBUR SMALL (A) 02/03/2018 [place] NEGATIVE 02/03/2018 1917   BILIRUBINUR neg 04/28/2012 1053   KETONESUR 20 (A) 02/03/2018 1917   PROTEINUR 100 (A) 02/03/2018 1917   UROBILINOGEN 4.0 (H) 11/16/2013 1646   NITRITE NEGATIVE 02/03/2018 1917   LEUKOCYTESUR NEGATIVE 02/03/2018 1917   Sepsis Labs: '@LABRCNTIP'$ (procalcitonin:4,lacticidven:4) ) Recent  Results (from the past 240 hour(s))  Urine culture     Status: None   Collection Time: 01/29/18  7:42 PM  Result Value Ref Range Status   Specimen Description URINE, RANDOM  Final   Special Requests NONE  Final  Culture   Final    NO GROWTH Performed at [facility], [place] [address] [place], [place] [address]    Report Status 01/31/2018 FINAL  Final  MRSA PCR Screening     Status: None   Collection Time: 01/30/18  7:10 AM  Result Value Ref Range Status   MRSA by PCR NEGATIVE NEGATIVE Final    Comment:        The GeneXpert MRSA Assay (FDA approved for NASAL specimens only), is one component of a comprehensive MRSA colonization surveillance program. It is not intended to diagnose MRSA infection nor to guide or monitor treatment for MRSA infections. Performed at [facility], [place] [address] [place], [place] [address]      Radiological Exams on Admission: Dg Chest 2 View  Result Date: 02/03/2018 CLINICAL DATA:  Syncope EXAM: CHEST - 2 VIEW COMPARISON:  01/29/2018, 12/23/2017 FINDINGS: The heart size and mediastinal contours are within normal limits. Both lungs are clear. No pneumothorax. IMPRESSION: No active cardiopulmonary disease. Electronically Signed   By: Donavan Foil M.D.   On: 02/03/2018 20:03   Ct Head Wo Contrast  Result Date: 02/03/2018 CLINICAL DATA:  Ct head/cspine wo, Pt has been walking in the rain today trying to get home after a dc from hospital, pt is cold to touch. Pt has abrasion to forehead, he fell face forwards onto cement. Pt has neck back and head pain 10/10. EXAM: CT HEAD WITHOUT CONTRAST CT CERVICAL SPINE WITHOUT CONTRAST TECHNIQUE: Multidetector CT imaging of the head and cervical spine was performed following the standard protocol without intravenous contrast. Multiplanar CT image reconstructions of the cervical spine were also generated. COMPARISON:  CT of the head on 01/30/2018 FINDINGS: CT HEAD FINDINGS Brain: There is central and  cortical atrophy. Periventricular white matter changes are consistent with small vessel disease. The basilar cisterns and ventricles have a normal appearance. There is no CT evidence for acute infarction or hemorrhage. A 4 millimeter calcified lesion is identified along the INNER table of the RIGHT frontal bone, consistent with small meningioma. There is no associated edema or mass effect. The appearance is stable. Vascular: There is dense atherosclerotic calcification of the internal carotid arteries. Skull: Normal. Negative for fracture or focal lesion. Sinuses/Orbits: Soft tissue swelling, polyp, or mucous retention cyst within the LEFT maxillary sinus. Chronic deformity of the nasal bones consistent with remote fracture. Other: None CT CERVICAL SPINE FINDINGS Alignment: Normal. Skull base and vertebrae: No acute fracture. No primary bone lesion or focal pathologic process. Soft tissues and spinal canal: There is atherosclerotic calcification of the vertebral and carotid arteries. Spinal canal is unremarkable. Disc levels:  Disc height loss and uncovertebral spurring at C6-7. Upper chest: Negative. Other: None IMPRESSION: 1. Atrophy and small vessel disease. 2.  No evidence for acute intracranial abnormality. 3. Remote fractures of the nasal bones. 4. Stable 4 millimeter calcified meningioma RIGHT frontal region not associated with edema or mass effect. 5. Mid cervical degenerative changes without evidence for acute cervical spine fracture. Electronically Signed   By: Nolon Nations M.D.   On: 02/03/2018 20:29   Ct Cervical Spine Wo Contrast  Result Date: 02/03/2018 CLINICAL DATA:  Ct head/cspine wo, Pt has been walking in the rain today trying to get home after a dc from hospital, pt is cold to touch. Pt has abrasion to forehead, he fell face forwards onto cement. Pt has neck back and head pain 10/10. EXAM: CT HEAD WITHOUT CONTRAST CT CERVICAL SPINE WITHOUT  CONTRAST TECHNIQUE: Multidetector CT imaging of  the head and cervical spine was performed following the standard protocol without intravenous contrast. Multiplanar CT image reconstructions of the cervical spine were also generated. COMPARISON:  CT of the head on 01/30/2018 FINDINGS: CT HEAD FINDINGS Brain: There is central and cortical atrophy. Periventricular white matter changes are consistent with small vessel disease. The basilar cisterns and ventricles have a normal appearance. There is no CT evidence for acute infarction or hemorrhage. A 4 millimeter calcified lesion is identified along the INNER table of the RIGHT frontal bone, consistent with small meningioma. There is no associated edema or mass effect. The appearance is stable. Vascular: There is dense atherosclerotic calcification of the internal carotid arteries. Skull: Normal. Negative for fracture or focal lesion. Sinuses/Orbits: Soft tissue swelling, polyp, or mucous retention cyst within the LEFT maxillary sinus. Chronic deformity of the nasal bones consistent with remote fracture. Other: None CT CERVICAL SPINE FINDINGS Alignment: Normal. Skull base and vertebrae: No acute fracture. No primary bone lesion or focal pathologic process. Soft tissues and spinal canal: There is atherosclerotic calcification of the vertebral and carotid arteries. Spinal canal is unremarkable. Disc levels:  Disc height loss and uncovertebral spurring at C6-7. Upper chest: Negative. Other: None IMPRESSION: 1. Atrophy and small vessel disease. 2.  No evidence for acute intracranial abnormality. 3. Remote fractures of the nasal bones. 4. Stable 4 millimeter calcified meningioma RIGHT frontal region not associated with edema or mass effect. 5. Mid cervical degenerative changes without evidence for acute cervical spine fracture. Electronically Signed   By: Nolon Nations M.D.   On: 02/03/2018 20:29   Ct Abdomen Pelvis W Contrast  Result Date: 02/02/2018 CLINICAL DATA:  Fall down stairs, generalized pain. Diverticulitis  suspected. EXAM: CT ABDOMEN AND PELVIS WITH CONTRAST TECHNIQUE: Multidetector CT imaging of the abdomen and pelvis was performed using the standard protocol following bolus administration of intravenous contrast. CONTRAST:  152m ISOVUE-300 IOPAMIDOL (ISOVUE-300) INJECTION 61% COMPARISON:  CT pelvis dated 02/23/2005. MRI abdomen dated 06/15/2004. FINDINGS: Lower chest: Lung bases are clear. Hepatobiliary: Stable mass within the LEFT liver lobe, compatible with benign hemangioma. No suspicious mass or lesion within the liver. No hepatic injury or perihepatic hematoma. Gallbladder appears normal. No bile duct dilatation. Pancreas: Unremarkable. No pancreatic ductal dilatation or surrounding inflammatory changes. Spleen: No splenic injury or perisplenic hematoma. Adrenals/Urinary Tract: Adrenal glands are unremarkable. 3 mm nonobstructing RIGHT renal stone. Kidneys otherwise unremarkable without suspicious mass, stone or hydronephrosis. No perinephric hemorrhage. Bladder appears normal, moderately distended. Stomach/Bowel: No dilated large or small bowel loops. No evidence of bowel Wuellner inflammation or bowel Wiest injury. Fairly large amount of stool throughout the nondistended colon. Appendix is normal. Stomach is unremarkable, partially decompressed. Vascular/Lymphatic: Aortic atherosclerosis. No enlarged abdominal or pelvic lymph nodes. Reproductive: Prostate is unremarkable. Other: No free fluid or hemorrhage within the abdomen or pelvis. No free intraperitoneal air. Musculoskeletal: Mild degenerative spondylosis of the lumbar spine. No acute appearing osseous abnormality. No evidence of acute fracture or subluxation within the lumbar spine or osseous pelvis. IMPRESSION: 1. No acute findings within the abdomen or pelvis. No evidence of solid organ injury. No free fluid or hemorrhage. No bowel obstruction or evidence of bowel Formica inflammation. Appendix is normal. No osseous fracture or dislocation. 2. Fairly large  amount of stool throughout the nondistended colon (constipation?). 3. Stable benign hemangioma within the LEFT liver lobe. 4.  Aortic Atherosclerosis (ICD10-I70.0). 5. 3 mm nonobstructing RIGHT renal stone. Electronically Signed   By: SCherlynn Kaiser  Enriqueta Shutter M.D.   On: 02/02/2018 17:00    EKG: Ordered, not yet performed.   Assessment/Plan  1. DKA; uncontrolled IDDM  - Presents with lightheadedness and fall, found to be in DKA with serum glucose 1082, serum bicarb 13, and urine ketones  - Patient admits to not taking any insulin since recent hospital discharge; substance abuse could also be contributing  - He was given 1 liter IVF in ED and started on insulin infusion  - Continue insulin infusion with frequent CBG's and serial chem panels   2. Acute kidney injury   - SCr is 3.13 on admission, up from 1.40 the day before   - Likely prerenal azotemia in setting of DKA with hypovolemia  - He was given a liter of LR in ED and will be continued on IVF hydration  - Check renal US and urine chemistries, renally-dose medications, avoid nephrotoxins, continue IVF hydration, repeat chem panel   3. Recurrent syncope  - Presents following a fall, reports that he was lightheaded and lost consciousness prior to the fall  - He was admitted a few days ago and evaluated for syncope with no significant findings on telemetry or echocardiogram  - Check EKG, continue cardiac monitoring, check orthostatic vitals, continue supportive care with IVF hydration    4. Leukocytosis  - WBC is 26,500 on admission without fever or apparent source of infection   - He had leukocytosis to 19,400 during prior admission with DKA, improved with tx of DKA, cultures remained negative  - This is likely reactive, will culture blood, watch off of antibiotics    5. Polysubstance abuse  - Hx of polysubstance abuse including by IV route, alcoholism  - Monitor with CIWA, use Ativan as-needed, supplement b-vitamins   6. Hypertension  - BP  at goal   - Continue Norvasc    DVT prophylaxis: sq heparin  Code Status: Full  Family Communication: Discussed with patient  Consults called: None Admission status: Inpatient     Vianne Bulls, MD [name] Pager [phone]  If 7PM-7AM, please contact night-coverage [email or web address] Password [facility]  02/03/2018, 9:37 PM

## 2018-02-04 ENCOUNTER — Inpatient Hospital Stay (HOSPITAL_COMMUNITY): Payer: Medicare HMO

## 2018-02-04 ENCOUNTER — Encounter (HOSPITAL_COMMUNITY): Payer: Self-pay

## 2018-02-04 ENCOUNTER — Other Ambulatory Visit: Payer: Self-pay

## 2018-02-04 DIAGNOSIS — I248 Other forms of acute ischemic heart disease: Secondary | ICD-10-CM

## 2018-02-04 DIAGNOSIS — E1010 Type 1 diabetes mellitus with ketoacidosis without coma: Principal | ICD-10-CM

## 2018-02-04 DIAGNOSIS — F10231 Alcohol dependence with withdrawal delirium: Secondary | ICD-10-CM

## 2018-02-04 DIAGNOSIS — J449 Chronic obstructive pulmonary disease, unspecified: Secondary | ICD-10-CM

## 2018-02-04 DIAGNOSIS — G9341 Metabolic encephalopathy: Secondary | ICD-10-CM

## 2018-02-04 LAB — CBC
HCT: 33.3 % — ABNORMAL LOW (ref 39.0–52.0)
HEMOGLOBIN: 11.1 g/dL — AB (ref 13.0–17.0)
MCH: 29.7 pg (ref 26.0–34.0)
MCHC: 33.3 g/dL (ref 30.0–36.0)
MCV: 89 fL (ref 80.0–100.0)
PLATELETS: 392 10*3/uL (ref 150–400)
RBC: 3.74 MIL/uL — ABNORMAL LOW (ref 4.22–5.81)
RDW: 13.1 % (ref 11.5–15.5)
WBC: 23.9 10*3/uL — ABNORMAL HIGH (ref 4.0–10.5)
nRBC: 0 % (ref 0.0–0.2)

## 2018-02-04 LAB — I-STAT ARTERIAL BLOOD GAS, ED
Acid-Base Excess: 12 mmol/L — ABNORMAL HIGH (ref 0.0–2.0)
BICARBONATE: 37 mmol/L — AB (ref 20.0–28.0)
O2 SAT: 99 %
PCO2 ART: 50 mmHg — AB (ref 32.0–48.0)
TCO2: 39 mmol/L — AB (ref 22–32)
pH, Arterial: 7.475 — ABNORMAL HIGH (ref 7.350–7.450)
pO2, Arterial: 119 mmHg — ABNORMAL HIGH (ref 83.0–108.0)

## 2018-02-04 LAB — BASIC METABOLIC PANEL
ANION GAP: 18 — AB (ref 5–15)
Anion gap: 10 (ref 5–15)
BUN: 52 mg/dL — AB (ref 8–23)
BUN: 52 mg/dL — ABNORMAL HIGH (ref 8–23)
CHLORIDE: 100 mmol/L (ref 98–111)
CHLORIDE: 104 mmol/L (ref 98–111)
CO2: 25 mmol/L (ref 22–32)
CO2: 32 mmol/L (ref 22–32)
Calcium: 9 mg/dL (ref 8.9–10.3)
Calcium: 9 mg/dL (ref 8.9–10.3)
Creatinine, Ser: 2.69 mg/dL — ABNORMAL HIGH (ref 0.61–1.24)
Creatinine, Ser: 2.24 mg/dL — ABNORMAL HIGH (ref 0.61–1.24)
GFR calc Af Amer: 28 mL/min — ABNORMAL LOW (ref >60)
GFR calc Af Amer: 35 mL/min — ABNORMAL LOW (ref >60)
GFR, EST NON AFRICAN AMERICAN: 24 mL/min — AB (ref >60)
GFR, EST NON AFRICAN AMERICAN: 30 mL/min — AB (ref >60)
GLUCOSE: 442 mg/dL — AB (ref 70–99)
GLUCOSE: 227 mg/dL — AB (ref 70–99)
POTASSIUM: 3.3 mmol/L — AB (ref 3.5–5.1)
POTASSIUM: 3.4 mmol/L — AB (ref 3.5–5.1)
Sodium: 143 mmol/L (ref 135–145)
Sodium: 146 mmol/L — ABNORMAL HIGH (ref 135–145)

## 2018-02-04 LAB — CBG MONITORING, ED
GLUCOSE-CAPILLARY: 308 mg/dL — AB (ref 70–99)
GLUCOSE-CAPILLARY: 265 mg/dL — AB (ref 70–99)
GLUCOSE-CAPILLARY: 233 mg/dL — AB (ref 70–99)
GLUCOSE-CAPILLARY: 182 mg/dL — AB (ref 70–99)
Glucose-Capillary: 128 mg/dL — ABNORMAL HIGH (ref 70–99)
Glucose-Capillary: 354 mg/dL — ABNORMAL HIGH (ref 70–99)
Glucose-Capillary: 484 mg/dL — ABNORMAL HIGH (ref 70–99)
Glucose-Capillary: 75 mg/dL (ref 70–99)
Glucose-Capillary: 78 mg/dL (ref 70–99)
Glucose-Capillary: 79 mg/dL (ref 70–99)

## 2018-02-04 LAB — GLUCOSE, CAPILLARY
GLUCOSE-CAPILLARY: 102 mg/dL — AB (ref 70–99)
GLUCOSE-CAPILLARY: 125 mg/dL — AB (ref 70–99)
GLUCOSE-CAPILLARY: 92 mg/dL (ref 70–99)
Glucose-Capillary: 32 mg/dL — CL (ref 70–99)
Glucose-Capillary: 59 mg/dL — ABNORMAL LOW (ref 70–99)
Glucose-Capillary: 75 mg/dL (ref 70–99)
Glucose-Capillary: 63 mg/dL — ABNORMAL LOW (ref 70–99)

## 2018-02-04 LAB — MRSA PCR SCREENING: MRSA by PCR: NEGATIVE

## 2018-02-04 LAB — ETHANOL: Alcohol, Ethyl (B): <10 mg/dL (ref <10)

## 2018-02-04 LAB — TROPONIN I
TROPONIN I: 2.7 ng/mL — AB (ref <0.03)
Troponin I: 2.16 ng/mL (ref <0.03)
Troponin I: 1.93 ng/mL (ref <0.03)

## 2018-02-04 MED ORDER — ORAL CARE MOUTH RINSE
15.0000 mL | Freq: Two times a day (BID) | OROMUCOSAL | Status: DC
  Administered 2018-02-04 – 2018-02-16 (×19): 15 mL via OROMUCOSAL

## 2018-02-04 MED ORDER — DEXMEDETOMIDINE HCL IN NACL 400 MCG/100ML IV SOLN
0.4000 ug/kg/h | INTRAVENOUS | Status: DC
  Administered 2018-02-04: 1.2 ug/kg/h via INTRAVENOUS
  Administered 2018-02-04: 0.7 ug/kg/h via INTRAVENOUS
  Administered 2018-02-05 (×2): 1 ug/kg/h via INTRAVENOUS
  Administered 2018-02-05: 0.9 ug/kg/h via INTRAVENOUS
  Administered 2018-02-06: 1 ug/kg/h via INTRAVENOUS
  Filled 2018-02-04 (×8): qty 100

## 2018-02-04 MED ORDER — DEXTROSE 50 % IV SOLN
INTRAVENOUS | Status: AC
  Filled 2018-02-04: qty 50

## 2018-02-04 MED ORDER — POTASSIUM CHLORIDE 10 MEQ/100ML IV SOLN
10.0000 meq | INTRAVENOUS | Status: AC
  Administered 2018-02-04 (×2): 10 meq via INTRAVENOUS
  Filled 2018-02-04 (×2): qty 100

## 2018-02-04 MED ORDER — INFLUENZA VAC SPLIT QUAD 0.5 ML IM SUSY
0.5000 mL | PREFILLED_SYRINGE | INTRAMUSCULAR | Status: AC
  Administered 2018-02-09: 0.5 mL via INTRAMUSCULAR
  Filled 2018-02-04: qty 0.5

## 2018-02-04 MED ORDER — SODIUM CHLORIDE 0.45 % IV SOLN
INTRAVENOUS | Status: DC
  Administered 2018-02-04: 10:00:00 via INTRAVENOUS
  Administered 2018-02-05: 1000 mL via INTRAVENOUS

## 2018-02-04 MED ORDER — PNEUMOCOCCAL VAC POLYVALENT 25 MCG/0.5ML IJ INJ
0.5000 mL | INJECTION | INTRAMUSCULAR | Status: DC
  Filled 2018-02-04 (×2): qty 0.5

## 2018-02-04 MED ORDER — INFLUENZA VAC SPLIT QUAD 0.5 ML IM SUSY: 0.5000 mL | PREFILLED_SYRINGE | INTRAMUSCULAR | Status: DC

## 2018-02-04 MED ORDER — INSULIN GLARGINE 100 UNIT/ML ~~LOC~~ SOLN: 10.0000 [IU] | Freq: Every day | SUBCUTANEOUS | Status: DC

## 2018-02-04 MED ORDER — SODIUM CHLORIDE 0.9 % IV SOLN
1.0000 mg | Freq: Once | INTRAVENOUS | Status: AC
  Administered 2018-02-04: 1 mg via INTRAVENOUS
  Filled 2018-02-04: qty 0.2

## 2018-02-04 MED ORDER — DEXMEDETOMIDINE HCL IN NACL 200 MCG/50ML IV SOLN
0.2000 ug/kg/h | INTRAVENOUS | Status: DC
  Administered 2018-02-04: 0.2 ug/kg/h via INTRAVENOUS
  Filled 2018-02-04 (×2): qty 50

## 2018-02-04 MED ORDER — INSULIN GLARGINE 100 UNIT/ML ~~LOC~~ SOLN
15.0000 [IU] | Freq: Every day | SUBCUTANEOUS | Status: DC
  Filled 2018-02-04: qty 0.15

## 2018-02-04 MED ORDER — LORAZEPAM 2 MG/ML IJ SOLN
1.0000 mg | INTRAMUSCULAR | Status: DC | PRN
  Administered 2018-02-05: 2 mg via INTRAVENOUS
  Filled 2018-02-04: qty 1

## 2018-02-04 MED ORDER — SODIUM CHLORIDE 0.9 % IV SOLN
3.0000 g | Freq: Three times a day (TID) | INTRAVENOUS | Status: DC
  Administered 2018-02-04 – 2018-02-06 (×6): 3 g via INTRAVENOUS
  Filled 2018-02-04 (×7): qty 3

## 2018-02-04 MED ORDER — DEXTROSE 50 % IV SOLN
INTRAVENOUS | Status: AC
  Administered 2018-02-04: 50 mL
  Filled 2018-02-04: qty 50

## 2018-02-04 MED ORDER — DEXTROSE 50 % IV SOLN: 25.0000 mL | Freq: Once | INTRAVENOUS | Status: AC

## 2018-02-04 MED ORDER — LORAZEPAM 2 MG/ML IJ SOLN
2.0000 mg | INTRAMUSCULAR | Status: DC | PRN
  Administered 2018-02-04 (×2): 2 mg via INTRAVENOUS
  Filled 2018-02-04 (×2): qty 1

## 2018-02-04 MED ORDER — INSULIN ASPART 100 UNIT/ML ~~LOC~~ SOLN: 0.0000 [IU] | SUBCUTANEOUS | Status: DC

## 2018-02-04 MED ORDER — INSULIN ASPART 100 UNIT/ML ~~LOC~~ SOLN
0.0000 [IU] | SUBCUTANEOUS | Status: DC
  Administered 2018-02-06 (×2): 1 [IU] via SUBCUTANEOUS
  Administered 2018-02-06 (×3): 2 [IU] via SUBCUTANEOUS
  Administered 2018-02-06: 1 [IU] via SUBCUTANEOUS
  Administered 2018-02-07: 2 [IU] via SUBCUTANEOUS
  Administered 2018-02-07: 5 [IU] via SUBCUTANEOUS
  Administered 2018-02-07: 3 [IU] via SUBCUTANEOUS

## 2018-02-04 MED ORDER — HYDRALAZINE HCL 20 MG/ML IJ SOLN: 10.0000 mg | Freq: Three times a day (TID) | INTRAMUSCULAR | Status: DC | PRN

## 2018-02-04 MED ORDER — INSULIN GLARGINE 100 UNIT/ML ~~LOC~~ SOLN
10.0000 [IU] | Freq: Every day | SUBCUTANEOUS | Status: DC
  Administered 2018-02-04 – 2018-02-06 (×3): 10 [IU] via SUBCUTANEOUS
  Filled 2018-02-04 (×5): qty 0.1

## 2018-02-04 NOTE — ED Notes (Signed)
DR. Tyrell Antonio responded to page regarding patient's Trop, EKG done per MDs order. MD also made aware of patient's continued elevated CIWA score.

## 2018-02-04 NOTE — Progress Notes (Signed)
Upon admission to ICU Pt hypoglycemic CBG was 59. Treated with D50 16ml per protocol.

## 2018-02-04 NOTE — ED Notes (Signed)
Pt changed; condom catheter applied at 11:40.

## 2018-02-04 NOTE — ED Notes (Signed)
Pt CBG 128

## 2018-02-04 NOTE — ED Notes (Signed)
Dr. Tyrell Antonio at bedside, she advised that patient get lantus insulin at this time, insulin drip be d/c'ed one hour following insulin admin and 1/2 NS started. MD stated she will continue to monitor CIWA for elevation, if not improving, patient may need to be upgraded to ICU bed request

## 2018-02-04 NOTE — Consult Note (Addendum)
NAME:  [name], MRN:  [id number], DOB:  May 17, 1955, LOS: 1 ADMISSION DATE:  02/03/2018, CONSULTATION DATE: 10/23 REFERRING MD: Tyrell Antonio, CHIEF COMPLAINT: Acute encephalopathy  Brief History   62 year old male patient with history of medical noncompliance, polysubstance abuse, and diabetes.  Admitted 10/22 with hyperglycemia, diabetic ketoacidosis, dehydration and renal failure after a fall at home.  Initially was alert and oriented but became more encephalopathic overnight.  Critical care asked to see on 10/23 for progressive metabolic encephalopathy  Past Medical History  Diabetes, polysubstance abuse, hepatitis C, glaucoma, depression.  [facility] Events   10/22 admitted with DKA, AKI, dehydration after a fall.  Diagnostic imaging negative for acute findings on CT head or C-spine.  Was alert and oriented on admission 10/23: Anion gap closed overnight, renal function improved some, however became progressively encephalopathic overnight requiring escalating Lorazepam critical care asked to see given concern about mental status, airway protection, and critical care services  Consults: date of consult/date signed off & final recs:  Vertical care consulted 10/23 Procedures (surgical and bedside):    Significant Diagnostic Tests:  CT head and C-spine 10/22 both negative for acute injury Renal US 10/23>>>  Micro Data:  10/23 urine culture: Negative 10/23 blood culture>>>  Antimicrobials:  10/23 Unasyn>>>  Subjective:  Unable  Objective   Blood pressure (Abnormal) 157/64, pulse 88, temperature 97.6 F (36.4 C), resp. rate 16, height 6' (1.829 m), weight 71.4 kg, SpO2 98 %.        Intake/Output Summary (Last 24 hours) at 02/04/2018 1140 Last data filed at 02/04/2018 0955 Gross per 24 hour  Intake 2255 ml  Output 400 ml  Net 1855 ml   Filed Weights   02/03/18 2009  Weight: 71.4 kg    Examination: General: Chronically ill-appearing 62 year old male  is currently encephalopathic and agitated HENT: Poor dentition mucous membranes moist no JVD Lungs: Coarse scattered rhonchi no accessory use, cough rhonchorous and thick Cardiovascular: Tachycardic regular rate Abdomen: Soft nontender Extremities: Multiple skin tears and bruising no edema Neuro: Awake, moves all extremities.  No focal deficits motor wise GU: Clear yellow  Resolved Hospital Problem list    Diabetic ketoacidosis  Assessment & Plan:   Acute metabolic encephalopathy, assumably secondary to EtOH withdrawal, however also consider metabolic derangements Plan Continue other supportive care DC Lorazepam Precedex infusion Admit to intensive care for closer observation with pulse oximetry and airway management such as suctioning as needed May need intubation   AKI in the setting of volume depletion Plan Continuing IV hydration Renal dose medications Strict intake output Follow-up a.m. Chemistry  Fluid and electrolyte balance: Mild hypernatremia, and hypokalemia Plan Continue IV fluids with lactated Ringer's Replace potassium A.m. chemistry  Diabetes with hyperglycemia Plan Sliding scale insulin  Concern for aspiration event with rising leukocytosis Plan Trend fever Procalcitonin algorithm Chest x-ray Empiric Unasyn  Chronic anemia Plan Trend CBC   Disposition / Summary of Today's Plan 02/04/18   Admission to intensive care for Precedex infusion and closer airway monitoring     Diet: N.p.o. Pain/Anxiety/Delirium protocol (if indicated): Precedex ICU algorithm started 10/23 VAP protocol (if indicated): Not indicated DVT prophylaxis: Subcu heparin GI prophylaxis: Not indicated Hyperglycemia protocol: Sliding scale insulin Mobility: Bedrest Code Status: Full code Family Communication: Pending  Labs   CBC: Recent Labs  Lab 01/29/18 1942 01/30/18 0922 02/02/18 1342 02/03/18 2005 02/04/18 0548  WBC 9.5 13.3* 10.9* 26.5* 23.9*  NEUTROABS  7.5  --   --  24.4*  --  HGB 13.5 12.1* 13.0 11.5* 11.1*  HCT 40.4 35.7* 39.3 37.5* 33.3*  MCV 90.6 89.7 91.2 98.4 89.0  PLT 350 369 322 426* 147    Basic Metabolic Panel: Recent Labs  Lab 01/29/18 1942  01/30/18 0922  01/31/18 0612 02/02/18 1342 02/03/18 2005 02/04/18 0127 02/04/18 0548  NA 129*  --  135  --  140 134* 135 143 146*  K 5.5*   < > 4.5  --  4.4 4.3 5.0 3.3* 3.4*  CL 99  --  103  --  108 100 89* 100 104  CO2 18*  --  23  --  23 25 13* 25 32  GLUCOSE 681*  --  420*   < > 47* 572* 1,082* 442* 227*  BUN 30*  --  20  --  18 29* 53* 52* 52*  CREATININE 1.54*  --  1.28*  --  1.15 1.40* 3.13* 2.69* 2.24*  CALCIUM 9.0  --  8.5*  --  8.8* 9.0 8.8* 9.0 9.0  MG 2.2  --  1.8  --   --   --   --   --   --   PHOS  --   --  3.2  --   --   --   --   --   --    < > = values in this interval not displayed.   GFR: Estimated Creatinine Clearance: 35 mL/min (A) (by C-G formula based on SCr of 2.24 mg/dL (H)). Recent Labs  Lab 01/29/18 2001 01/29/18 2315 01/30/18 0922 02/02/18 1342 02/03/18 2005 02/04/18 0548  WBC  --   --  13.3* 10.9* 26.5* 23.9*  LATICACIDVEN 2.04* 1.69  --   --   --   --     Liver Function Tests: Recent Labs  Lab 01/29/18 1942 01/31/18 0612 02/03/18 2005  AST '30 15 27  '$ ALT '22 13 23  '$ ALKPHOS 112 76 115  BILITOT 1.9* 0.3 2.0*  PROT 6.0* 5.3* 6.5  ALBUMIN 2.9* 2.3* 3.1*   Recent Labs  Lab 01/29/18 1942  LIPASE 27   No results for input(s): AMMONIA in the last 168 hours.  ABG    Component Value Date/Time   PHART 7.475 (H) 02/04/2018 0922   PCO2ART 50.0 (H) 02/04/2018 0922   PO2ART 119.0 (H) 02/04/2018 0922   HCO3 37.0 (H) 02/04/2018 0922   TCO2 39 (H) 02/04/2018 0922   ACIDBASEDEF 11.8 (H) 04/18/2016 1339   O2SAT 99.0 02/04/2018 0922     Coagulation Profile: No results for input(s): INR, PROTIME in the last 168 hours.  Cardiac Enzymes: Recent Labs  Lab 01/30/18 0922 01/30/18 1143 02/04/18 0913  TROPONINI <0.03 <0.03 2.70*     HbA1C: Hgb A1c MFr Bld  Date/Time Value Ref Range Status  01/30/2018 09:22 AM 10.1 (H) 4.8 - 5.6 % Final    Comment:    (NOTE) Pre diabetes:          5.7%-6.4% Diabetes:              >6.4% Glycemic control for   <7.0% adults with diabetes   12/24/2017 03:25 AM 9.5 (H) 4.8 - 5.6 % Final    Comment:    (NOTE) Pre diabetes:          5.7%-6.4% Diabetes:              >6.4% Glycemic control for   <7.0% adults with diabetes     CBG: Recent Labs  Lab 02/04/18 0526 02/04/18 8295  02/04/18 0758 02/04/18 0903 02/04/18 1012  GLUCAP 233* 182* 128* 78 75    Admitting History of Present Illness.   Patient unable to provide HPI, information obtained from medical records and nursing staff.  62 year old male patient with a history of polysubstance abuse including IV drug abuse and EtOH also has a history of chronic hep C status post treatment and diabetes.  Presents to the hospital following a fall on 10/22, stated he thought he may have blacked out.  He was just discharged from the hospital 3 days prior from recurrent syncope felt secondary to hypovolemia.  He does not take medications due to financial constraints.  In the ER he was found to be afebrile, CT head was negative, CT spine was negative.  Blood chemistry showed glucose of 1082, bicarbonate of 13, anion gap of 33 and a creatinine of 3.13.  He was admitted with working diagnosis of diabetic ketoacidosis and dehydration.  He was placed on CIWA protocol given history of polysubstance abuse.  Over the course of the evening from 10/22 to 10/23 he became progressively encephalopathic and confused.  Requiring PRN Ativan.  His anion gap was successfully closed, however  he did demonstrate worsening work of breathing and rhonchus respiratory efforts critical care was asked to see on 10/23 given concern about possible withdrawal/worsening encephalopathy and for airway protection ability.  Review of Systems:   Unable due to  encephalopathy  Past Medical History  He,  has a past medical history of DEPRESSION, DIABETES MELLITUS, TYPE I, DRUG ABUSE, Glaucoma, HEPATITIS C, Hypertension (02/18/2011), Proliferative diabetic retinopathy(362.02), and Vitiligo.   Surgical History    Past Surgical History:  Procedure Laterality Date  . ABDOMINAL AORTOGRAM W/LOWER EXTREMITY N/A 07/25/2016   Procedure: Abdominal Aortogram w/Bilateral Lower Extremity Runoff;  Surgeon: Conrad Adel, MD;  Location: [facility];  Service: Cardiovascular;  Laterality: N/A;  . ABDOMINAL AORTOGRAM W/LOWER EXTREMITY N/A 12/24/2016   Procedure: ABDOMINAL AORTOGRAM W/LOWER EXTREMITY;  Surgeon: Serafina Mitchell, MD;  Location: [facility];  Service: Cardiovascular;  Laterality: N/A;  . AMPUTATION TOE Right 07/26/2016   Procedure: AMPUTATION GREAT TOE;  Surgeon: Serafina Mitchell, MD;  Location: [place] [facility];  Service: Vascular;  Laterality: Right;  . EYE SURGERY     retinal surgery x 2, right eye  . INSERTION OF ILIAC STENT Right 07/26/2016   Procedure: INSERTION OF RIGHT POPLITEAL STENT WITH BALLOON ANGIOPLASTY;  Surgeon: Serafina Mitchell, MD;  Location: [place];  Service: Vascular;  Laterality: Right;  . LOWER EXTREMITY ANGIOGRAM Right 07/26/2016   Procedure: LOWER EXTREMITY ANGIOGRAM;  Surgeon: Serafina Mitchell, MD;  Location: [place];  Service: Vascular;  Laterality: Right;  . PERIPHERAL VASCULAR BALLOON ANGIOPLASTY Right 07/26/2016   Procedure: PERIPHERAL VASCULAR BALLOON ANGIOPLASTY  RIGHT ANTERIOR TIBEAL ARTERY AND RIGHT SUPERFICIAL FEMORAL ARTERY;  Surgeon: Serafina Mitchell, MD;  Location: [place] [facility];  Service: Vascular;  Laterality: Right;  . PERIPHERAL VASCULAR INTERVENTION Right 12/24/2016   Procedure: PERIPHERAL VASCULAR INTERVENTION;  Surgeon: Serafina Mitchell, MD;  Location: [facility] [facility];  Service: Cardiovascular;  Laterality: Right;  lower extr     Social History   Social History   Socioeconomic History  . Marital status: Single     Spouse name: Not on file  . Number of children: Not on file  . Years of education: Not on file  . Highest education level: Not on file  Occupational History    Employer: UNEMPLOYED    Comment:  disabled  Social Needs  . Financial resource strain: Not on file  . Food insecurity:    Worry: Not on file    Inability: Not on file  . Transportation needs:    Medical: Not on file    Non-medical: Not on file  Tobacco Use  . Smoking status: Current Every Day Smoker    Packs/day: 1.00    Types: Cigarettes  . Smokeless tobacco: Never Used  Substance and Sexual Activity  . Alcohol use: No    Alcohol/week: 0.0 standard drinks    Comment: 08/14/14 none  . Drug use: Yes    Types: "Crack" cocaine, Marijuana, Heroin    Comment: s/p rehab x 6, occasional drug use (per pt last  use was 06/2015)  . Sexual activity: Not Currently  Lifestyle  . Physical activity:    Days per week: Not on file    Minutes per session: Not on file  . Stress: Not on file  Relationships  . Social connections:    Talks on phone: Not on file    Gets together: Not on file    Attends religious service: Not on file    Active member of club or organization: Not on file    Attends meetings of clubs or organizations: Not on file    Relationship status: Not on file  . Intimate partner violence:    Fear of current or ex partner: Not on file    Emotionally abused: Not on file    Physically abused: Not on file    Forced sexual activity: Not on file  Other Topics Concern  . Not on file  Social History Narrative   Lives in boarding house/homeless   Ongoing occ drug use-crack   Single, disabled, prev mental health drug counselor.   ,  reports that he has been smoking cigarettes. He has been smoking about 1.00 pack per day. He has never used smokeless tobacco. He reports that he has current or past drug history. Drugs: "Crack" cocaine, Marijuana, and Heroin. He reports that he does not drink alcohol.   Family History   His  family history includes Arthritis in his mother; Diabetes in his father; Heart disease in his mother; Kidney disease in his father.   Allergies Allergies  Allergen Reactions  . Codeine Itching    Tolerates hydrocodone/apap     Home Medications  Prior to Admission medications   Medication Sig Start Date End Date Taking? Authorizing Provider  insulin NPH Human (HUMULIN N,NOVOLIN N) 100 UNIT/ML injection Inject 0.15 mLs (15 Units total) into the skin daily before breakfast. 02/01/18  Yes Lady Deutscher, MD  amLODipine (NORVASC) 10 MG tablet Take 1 tablet (10 mg total) by mouth daily. Patient not taking: Reported on 02/02/2018 02/01/18 03/03/18  Lady Deutscher, MD  aspirin EC 81 MG EC tablet Take 1 tablet (81 mg total) by mouth daily. Patient not taking: Reported on 02/02/2018 07/30/16   Lavina Hamman, MD  blood glucose meter kit and supplies KIT Dispense based on patient and insurance preference. Use up to four times daily as directed. (FOR ICD-9 250.00, 250.01). Patient not taking: Reported on 02/03/2018 12/24/17   Elgergawy, Silver Huguenin, MD  folic acid (FOLVITE) 1 MG tablet Take 1 tablet (1 mg total) by mouth daily. Patient not taking: Reported on 02/02/2018 02/01/18 03/03/18  Lady Deutscher, MD  Multiple Vitamin (MULTIVITAMIN WITH MINERALS) TABS tablet Take 1 tablet by mouth daily. Patient not taking: Reported on 02/02/2018 02/01/18 05/02/18  Lady Deutscher, MD  nicotine (NICODERM CQ - DOSED IN MG/24 HOURS) 14 mg/24hr patch Place 1 patch (14 mg total) onto the skin daily. Patient not taking: Reported on 02/02/2018 02/01/18   Lady Deutscher, MD  thiamine 100 MG tablet Take 1 tablet (100 mg total) by mouth daily. Patient not taking: Reported on 02/02/2018 02/01/18 05/02/18  Lady Deutscher, MD     Critical care time: [place] [facility] [place] Pager # [phone] OR # [phone] if no answer  Attending Note:  62 year old male with  etoh history abuse presenting with DKA.  Patient was placed on DKA protocol and now the gap is closed.  Now patient is presenting to PCCM with DTs.  Patient has received multiple doses of ativan.  On exam, he is very tremulous and confused but protecting his airway.  I reviewed CXR myself, no acute disease noted.  Discussed with PCCM-NP.  Will admit to the ICU.  Place on precedex drip.  Thiamine/folate/MVI.  IVF resuscitation.  Insuline protocol but d/c DKA protocol.  PCCM will admit to the ICU.  The patient is critically ill with multiple organ systems failure and requires high complexity decision making for assessment and support, frequent evaluation and titration of therapies, application of advanced monitoring technologies and extensive interpretation of multiple databases.   Critical Care Time devoted to patient care services described in this note is  45  Minutes. This time reflects time of care of this signee Dr Jennet Maduro. This critical care time does not reflect procedure time, or teaching time or supervisory time of PA/NP/Med student/Med Resident etc but could involve care discussion time.  Rush Farmer, M.D. [facility]. Pager: [phone]. After hours pager: [phone].

## 2018-02-04 NOTE — Consult Note (Addendum)
Cardiology Consultation:   Patient ID: [name] MRN: [id number]; DOB: Aug 07, 1955  Admit date: 02/03/2018 Date of Consult: 02/04/2018  Primary Care Provider: [name], MD Primary Cardiologist: No primary care provider on file.  Primary Electrophysiologist:  None    Patient Profile:   [name] is a 62 y.o. male with a hx of Poly-substance use (tobacco, crack-cocaine, Majiuana, heroine); Diabetes; PAD (s/p R iliac stent); HTN; Chronic Hep C (s/p Tx); and medication nonadherance who is being seen today for the evaluation of Troponinemia at the request of Dr. Tyrell Antonio.  History of Present Illness:   [name] presented following a fall where he hit his head. Of note he was recently admitted for syncope from 10/17 to 10/20 and his work up was negative arrhythmia or AS at that time and was determined to be 2/2 his uncontrolled hypertension. He was re-admitted on 10/22 following his fall. CT was negative for acute intracranial abnormality or neck injury. Patient was noted to be in DKA and was treated with IVF and Insulin gtt. Patient noted to have positive troponin to 2.7 this morning and cardiology was consulted. Overnight, patient devloped worsening encephalopathy (presumably 2/2 to alcohol withdrawal) and is now being transferred to the ICU for Precedex gtt and airway monitoring. He denied chest pain at presentation per chart review.    Past Medical History:  Diagnosis Date  . DEPRESSION   . DIABETES MELLITUS, TYPE I   . DRUG ABUSE    pt should have NO controlled substances rx'ed  . Glaucoma   . HEPATITIS C    chronic  . Hypertension 02/18/2011  . 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 Vitiligo     Past Surgical History:  Procedure Laterality Date  . ABDOMINAL AORTOGRAM W/LOWER EXTREMITY N/A 07/25/2016   Procedure: Abdominal Aortogram w/Bilateral Lower Extremity Runoff;  Surgeon: Conrad Rohrersville, MD;  Location: [facility];  Service: Cardiovascular;  Laterality:  N/A;  . ABDOMINAL AORTOGRAM W/LOWER EXTREMITY N/A 12/24/2016   Procedure: ABDOMINAL AORTOGRAM W/LOWER EXTREMITY;  Surgeon: Serafina Mitchell, MD;  Location: [facility];  Service: Cardiovascular;  Laterality: N/A;  . AMPUTATION TOE Right 07/26/2016   Procedure: AMPUTATION GREAT TOE;  Surgeon: Serafina Mitchell, MD;  Location: [facility];  Service: Vascular;  Laterality: Right;  . EYE SURGERY     retinal surgery x 2, right eye  . INSERTION OF ILIAC STENT Right 07/26/2016   Procedure: INSERTION OF RIGHT POPLITEAL STENT WITH BALLOON ANGIOPLASTY;  Surgeon: Serafina Mitchell, MD;  Location: [facility];  Service: Vascular;  Laterality: Right;  . LOWER EXTREMITY ANGIOGRAM Right 07/26/2016   Procedure: LOWER EXTREMITY ANGIOGRAM;  Surgeon: Serafina Mitchell, MD;  Location: [facility];  Service: Vascular;  Laterality: Right;  . PERIPHERAL VASCULAR BALLOON ANGIOPLASTY Right 07/26/2016   Procedure: PERIPHERAL VASCULAR BALLOON ANGIOPLASTY  RIGHT ANTERIOR TIBEAL ARTERY AND RIGHT SUPERFICIAL FEMORAL ARTERY;  Surgeon: Serafina Mitchell, MD;  Location: [facility];  Service: Vascular;  Laterality: Right;  . PERIPHERAL VASCULAR INTERVENTION Right 12/24/2016   Procedure: PERIPHERAL VASCULAR INTERVENTION;  Surgeon: Serafina Mitchell, MD;  Location: [facility];  Service: Cardiovascular;  Laterality: Right;  lower extr     Home Medications:  Prior to Admission medications   Medication Sig Start Date End Date Taking? Authorizing Provider  insulin NPH Human (HUMULIN N,NOVOLIN N) 100 UNIT/ML injection Inject 0.15 mLs (15 Units total) into the skin daily before breakfast. 02/01/18  Yes Lady Deutscher, MD  amLODipine (NORVASC) 10 MG tablet Take 1 tablet (10 mg total) by mouth daily. Patient not taking: Reported on 02/02/2018 02/01/18 03/03/18  Lady Deutscher, MD  aspirin EC 81 MG EC tablet Take 1 tablet (81 mg total) by mouth daily. Patient not taking: Reported on 02/02/2018 07/30/16   Lavina Hamman, MD  blood glucose meter kit  and supplies KIT Dispense based on patient and insurance preference. Use up to four times daily as directed. (FOR ICD-9 250.00, 250.01). Patient not taking: Reported on 02/03/2018 12/24/17   Elgergawy, Silver Huguenin, MD  folic acid (FOLVITE) 1 MG tablet Take 1 tablet (1 mg total) by mouth daily. Patient not taking: Reported on 02/02/2018 02/01/18 03/03/18  Lady Deutscher, MD  Multiple Vitamin (MULTIVITAMIN WITH MINERALS) TABS tablet Take 1 tablet by mouth daily. Patient not taking: Reported on 02/02/2018 02/01/18 05/02/18  Lady Deutscher, MD  nicotine (NICODERM CQ - DOSED IN MG/24 HOURS) 14 mg/24hr patch Place 1 patch (14 mg total) onto the skin daily. Patient not taking: Reported on 02/02/2018 02/01/18   Lady Deutscher, MD  thiamine 100 MG tablet Take 1 tablet (100 mg total) by mouth daily. Patient not taking: Reported on 02/02/2018 02/01/18 05/02/18  Lady Deutscher, MD    Inpatient Medications: Scheduled Meds: . heparin  5,000 Units Subcutaneous Q8H  . insulin aspart  0-15 Units Subcutaneous Q4H  . insulin glargine  10 Units Subcutaneous Daily  . nicotine  14 mg Transdermal Daily  . thiamine  100 mg Intravenous Daily   Continuous Infusions: . sodium chloride 100 mL/hr at 02/04/18 1027  . ampicillin-sulbactam (UNASYN) IV Stopped (02/04/18 1149)  . dexmedetomidine (PRECEDEX) IV infusion    . folic acid (FOLVITE) IVPB     PRN Meds: LORazepam  Allergies:    Allergies  Allergen Reactions  . Codeine Itching    Tolerates hydrocodone/apap    Social History:   Social History   Socioeconomic History  . Marital status: Single    Spouse name: Not on file  . Number of children: Not on file  . Years of education: Not on file  . Highest education level: Not on file  Occupational History    Employer: UNEMPLOYED    Comment: disabled  Social Needs  . Financial resource strain: Not on file  . Food insecurity:    Worry: Not on file    Inability: Not on file  .  Transportation needs:    Medical: Not on file    Non-medical: Not on file  Tobacco Use  . Smoking status: Current Every Day Smoker    Packs/day: 1.00    Types: Cigarettes  . Smokeless tobacco: Never Used  Substance and Sexual Activity  . Alcohol use: No    Alcohol/week: 0.0 standard drinks    Comment: 08/14/14 none  . Drug use: Yes    Types: "Crack" cocaine, Marijuana, Heroin    Comment: s/p rehab x 6, occasional drug use (per pt last  use was 06/2015)  . Sexual activity: Not Currently  Lifestyle  . Physical activity:    Days per week: Not on file    Minutes per session: Not on file  . Stress: Not on file  Relationships  . Social connections:    Talks on phone: Not on file    Gets together: Not on file    Attends religious service: Not on file    Active member of club or organization: Not on file    Attends meetings of  clubs or organizations: Not on file    Relationship status: Not on file  . Intimate partner violence:    Fear of current or ex partner: Not on file    Emotionally abused: Not on file    Physically abused: Not on file    Forced sexual activity: Not on file  Other Topics Concern  . Not on file  Social History Narrative   Lives in boarding house/homeless   Ongoing occ drug use-crack   Single, disabled, prev mental health drug counselor.     Family History:    Family History  Problem Relation Age of Onset  . Diabetes Father   . Kidney disease Father   . Arthritis Mother   . Heart disease Mother        CAD     ROS:  Please see the history of present illness.   Unable to be obtained 2/2 patient's Encephalopathy.     Physical Exam/Data:   Vitals:   02/04/18 1100 02/04/18 1115 02/04/18 1126 02/04/18 1145  BP: (!) 172/69 (!) 157/64 (!) 157/64 (!) 149/68  Pulse: 94 93 88 89  Resp:  13 16 (!) 24  Temp:      SpO2: 100% 98% 98% 100%  Weight:      Height:        Intake/Output Summary (Last 24 hours) at 02/04/2018 1216 Last data filed at 02/04/2018  1149 Gross per 24 hour  Intake 2355 ml  Output 400 ml  Net 1955 ml   Filed Weights   02/03/18 2009  Weight: 71.4 kg   Body mass index is 21.35 kg/m.  General:  Well nourished, well developed, Encephalopathic and somnolent male HEENT: normal Lymph: no adenopathy Neck: no JVD Endocrine:  No thryomegaly Vascular: No carotid bruits; pulses 2+ bilaterally without bruits  Cardiac:  normal S1, S2; RRR; no murmur Lungs: Bilateral rhonchi; No wheezing or rales  Abd: soft, nontender Ext: no edema Skin: warm and dry  Neuro:  Encephalopathic, unable to assess Psych:  Normal affect   EKG:  The EKG was personally reviewed and demonstrates: Sinus Rhythm, Q-wave and TWI in aVL (similar to previous), Non-specific T-wave abnormality in V6.  Telemetry:  Telemetry was personally reviewed and demonstrates:  Normal Sinus Rhythm  Relevant CV Studies:  Echo 01/30/2018 Study Conclusions - Left ventricle: The cavity size was normal. Heidelberg thickness was   normal. Systolic function was normal. The estimated ejection   fraction was in the range of 55% to 60%. Branham motion was normal;   there were no regional Cotrell motion abnormalities. Doppler   parameters are consistent with abnormal left ventricular   relaxation (grade 1 diastolic dysfunction). The E/e&' ratio is   >15, suggesting elevated LV filling pressure. - Mitral valve: Calcified annulus. Mildly thickened leaflets .   There was trivial regurgitation. - Left atrium: The atrium was normal in size. - Inferior vena cava: The vessel was normal in size. The   respirophasic diameter changes were in the normal range (>= 50%),   consistent with normal central venous pressure.  Impressions: - Compared to a prior study in 2018, the LVEF is lower, but normal   at 55-60%, now with grade 1 DD and elevated LV filling pressure.   Laboratory Data:  Chemistry Recent Labs  Lab 02/03/18 2005 02/04/18 0127 02/04/18 0548  NA 135 143 146*  K 5.0 3.3*  3.4*  CL 89* 100 104  CO2 13* 25 32  GLUCOSE 1,082* 442* 227*  BUN 53*  52* 52*  CREATININE 3.13* 2.69* 2.24*  CALCIUM 8.8* 9.0 9.0  GFRNONAA 20* 24* 30*  GFRAA 23* 28* 35*  ANIONGAP 33* 18* 10    Recent Labs  Lab 01/29/18 1942 01/31/18 0612 02/03/18 2005  PROT 6.0* 5.3* 6.5  ALBUMIN 2.9* 2.3* 3.1*  AST '30 15 27  '$ ALT '22 13 23  '$ ALKPHOS 112 76 115  BILITOT 1.9* 0.3 2.0*   Hematology Recent Labs  Lab 02/02/18 1342 02/03/18 2005 02/04/18 0548  WBC 10.9* 26.5* 23.9*  RBC 4.31 3.81* 3.74*  HGB 13.0 11.5* 11.1*  HCT 39.3 37.5* 33.3*  MCV 91.2 98.4 89.0  MCH 30.2 30.2 29.7  MCHC 33.1 30.7 33.3  RDW 13.2 13.3 13.1  PLT 322 426* 392   Cardiac Enzymes Recent Labs  Lab 01/30/18 0922 01/30/18 1143 02/04/18 0913  TROPONINI <0.03 <0.03 2.70*    Recent Labs  Lab 01/29/18 1959 01/29/18 2313  TROPIPOC 0.01 0.01    BNPNo results for input(s): BNP, PROBNP in the last 168 hours.  DDimer No results for input(s): DDIMER in the last 168 hours.  Radiology/Studies:  Dg Chest 2 View  Result Date: 02/03/2018 CLINICAL DATA:  Syncope EXAM: CHEST - 2 VIEW COMPARISON:  01/29/2018, 12/23/2017 FINDINGS: The heart size and mediastinal contours are within normal limits. Both lungs are clear. No pneumothorax. IMPRESSION: No active cardiopulmonary disease. Electronically Signed   By: Donavan Foil M.D.   On: 02/03/2018 20:03   Ct Head Wo Contrast  Result Date: 02/03/2018 CLINICAL DATA:  Ct head/cspine wo, Pt has been walking in the rain today trying to get home after a dc from hospital, pt is cold to touch. Pt has abrasion to forehead, he fell face forwards onto cement. Pt has neck back and head pain 10/10. EXAM: CT HEAD WITHOUT CONTRAST CT CERVICAL SPINE WITHOUT CONTRAST TECHNIQUE: Multidetector CT imaging of the head and cervical spine was performed following the standard protocol without intravenous contrast. Multiplanar CT image reconstructions of the cervical spine were also generated.  COMPARISON:  CT of the head on 01/30/2018 FINDINGS: CT HEAD FINDINGS Brain: There is central and cortical atrophy. Periventricular white matter changes are consistent with small vessel disease. The basilar cisterns and ventricles have a normal appearance. There is no CT evidence for acute infarction or hemorrhage. A 4 millimeter calcified lesion is identified along the INNER table of the RIGHT frontal bone, consistent with small meningioma. There is no associated edema or mass effect. The appearance is stable. Vascular: There is dense atherosclerotic calcification of the internal carotid arteries. Skull: Normal. Negative for fracture or focal lesion. Sinuses/Orbits: Soft tissue swelling, polyp, or mucous retention cyst within the LEFT maxillary sinus. Chronic deformity of the nasal bones consistent with remote fracture. Other: None CT CERVICAL SPINE FINDINGS Alignment: Normal. Skull base and vertebrae: No acute fracture. No primary bone lesion or focal pathologic process. Soft tissues and spinal canal: There is atherosclerotic calcification of the vertebral and carotid arteries. Spinal canal is unremarkable. Disc levels:  Disc height loss and uncovertebral spurring at C6-7. Upper chest: Negative. Other: None IMPRESSION: 1. Atrophy and small vessel disease. 2.  No evidence for acute intracranial abnormality. 3. Remote fractures of the nasal bones. 4. Stable 4 millimeter calcified meningioma RIGHT frontal region not associated with edema or mass effect. 5. Mid cervical degenerative changes without evidence for acute cervical spine fracture. Electronically Signed   By: Nolon Nations M.D.   On: 02/03/2018 20:29   Ct Cervical Spine Wo Contrast  Result  Date: 02/03/2018 CLINICAL DATA:  Ct head/cspine wo, Pt has been walking in the rain today trying to get home after a dc from hospital, pt is cold to touch. Pt has abrasion to forehead, he fell face forwards onto cement. Pt has neck back and head pain 10/10. EXAM: CT  HEAD WITHOUT CONTRAST CT CERVICAL SPINE WITHOUT CONTRAST TECHNIQUE: Multidetector CT imaging of the head and cervical spine was performed following the standard protocol without intravenous contrast. Multiplanar CT image reconstructions of the cervical spine were also generated. COMPARISON:  CT of the head on 01/30/2018 FINDINGS: CT HEAD FINDINGS Brain: There is central and cortical atrophy. Periventricular white matter changes are consistent with small vessel disease. The basilar cisterns and ventricles have a normal appearance. There is no CT evidence for acute infarction or hemorrhage. A 4 millimeter calcified lesion is identified along the INNER table of the RIGHT frontal bone, consistent with small meningioma. There is no associated edema or mass effect. The appearance is stable. Vascular: There is dense atherosclerotic calcification of the internal carotid arteries. Skull: Normal. Negative for fracture or focal lesion. Sinuses/Orbits: Soft tissue swelling, polyp, or mucous retention cyst within the LEFT maxillary sinus. Chronic deformity of the nasal bones consistent with remote fracture. Other: None CT CERVICAL SPINE FINDINGS Alignment: Normal. Skull base and vertebrae: No acute fracture. No primary bone lesion or focal pathologic process. Soft tissues and spinal canal: There is atherosclerotic calcification of the vertebral and carotid arteries. Spinal canal is unremarkable. Disc levels:  Disc height loss and uncovertebral spurring at C6-7. Upper chest: Negative. Other: None IMPRESSION: 1. Atrophy and small vessel disease. 2.  No evidence for acute intracranial abnormality. 3. Remote fractures of the nasal bones. 4. Stable 4 millimeter calcified meningioma RIGHT frontal region not associated with edema or mass effect. 5. Mid cervical degenerative changes without evidence for acute cervical spine fracture. Electronically Signed   By: Nolon Nations M.D.   On: 02/03/2018 20:29   Ct Abdomen Pelvis W  Contrast  Result Date: 02/02/2018 CLINICAL DATA:  Fall down stairs, generalized pain. Diverticulitis suspected. EXAM: CT ABDOMEN AND PELVIS WITH CONTRAST TECHNIQUE: Multidetector CT imaging of the abdomen and pelvis was performed using the standard protocol following bolus administration of intravenous contrast. CONTRAST:  146m ISOVUE-300 IOPAMIDOL (ISOVUE-300) INJECTION 61% COMPARISON:  CT pelvis dated 02/23/2005. MRI abdomen dated 06/15/2004. FINDINGS: Lower chest: Lung bases are clear. Hepatobiliary: Stable mass within the LEFT liver lobe, compatible with benign hemangioma. No suspicious mass or lesion within the liver. No hepatic injury or perihepatic hematoma. Gallbladder appears normal. No bile duct dilatation. Pancreas: Unremarkable. No pancreatic ductal dilatation or surrounding inflammatory changes. Spleen: No splenic injury or perisplenic hematoma. Adrenals/Urinary Tract: Adrenal glands are unremarkable. 3 mm nonobstructing RIGHT renal stone. Kidneys otherwise unremarkable without suspicious mass, stone or hydronephrosis. No perinephric hemorrhage. Bladder appears normal, moderately distended. Stomach/Bowel: No dilated large or small bowel loops. No evidence of bowel Lantzy inflammation or bowel Epple injury. Fairly large amount of stool throughout the nondistended colon. Appendix is normal. Stomach is unremarkable, partially decompressed. Vascular/Lymphatic: Aortic atherosclerosis. No enlarged abdominal or pelvic lymph nodes. Reproductive: Prostate is unremarkable. Other: No free fluid or hemorrhage within the abdomen or pelvis. No free intraperitoneal air. Musculoskeletal: Mild degenerative spondylosis of the lumbar spine. No acute appearing osseous abnormality. No evidence of acute fracture or subluxation within the lumbar spine or osseous pelvis. IMPRESSION: 1. No acute findings within the abdomen or pelvis. No evidence of solid organ injury. No free fluid or  hemorrhage. No bowel obstruction or  evidence of bowel Yerby inflammation. Appendix is normal. No osseous fracture or dislocation. 2. Fairly large amount of stool throughout the nondistended colon (constipation?). 3. Stable benign hemangioma within the LEFT liver lobe. 4.  Aortic Atherosclerosis (ICD10-I70.0). 5. 3 mm nonobstructing RIGHT renal stone. Electronically Signed   By: Franki Cabot M.D.   On: 02/02/2018 17:00    Assessment and Plan:   1) Demand Ischemia: Troponin elevated to 2.7.  EKG sinus rhythm with old q-wave and TWI inversion in aVL, no ST changes. Patient risk factors include Tobacco and cocaine use, uncontrolled HTN, uncontrolled Diabetes (A1c 10.1), and PAD. Last Lipid Panel 2018 with LDL 100. Nuclear perfusion study for SOB in 2015 was normal / low risk. Suspect Demand Ischemia given lack of reported anginal symptoms (per chart review) and troponinemia occurring in the setting of dehydration 2/2 DKA; suspected alcohol withdrawal now requiring Precedex gtt; and significant AKI. UDS negative on admission. Recent Echo from 01/30/2018 with EF 55-60% and G1DD. Given risk factors and PAD patient may benefit from stress test after resolution of acute illness pending hospital course. Will monitor enzyme trend in the setting of ongoing acute illness; consider heparin if troponin trending up or new symptoms develop. - Serial troponin's, follow up trend  2) Encephalopathy/Withdrawl: Suspected to be 2/2 alcohol withdrawl. Precedex gtt and work up per [employer].  3) Hypokalemia: K 3.4 this AM after being on Insulin gtt. Received 66mq IV x2. Monitor and Replete PRN.  4) Leukocytosis: WBC elevated to 26K on admission without fever. Thought to be reactive initially due to previous elevation of 19K during previous admission. Now receiving Unasyn to cover for aspiration in the setting on encephalopathy. Per PCCM.  5) DM/DKA: Glucose >1,000 on admission, Improved, now off Insulin gtt. Recent A1c 10.1. Insulin per PCCM 6) AKI: Cr 3.1 on  admission, improving with IVF, 2.2 today. 7) HTN: Currently normotensive, home medications held. Precedex gtt ordered. 8) Polysubstance Use: Continue to use tobacco and alcohol, unclear when last crack-cocaine use was though UDS is negative.     For questions or updates, please contact CBlackwaterPlease consult [email or web address] for contact info under   Signed, ANeva Seat MD  02/04/2018 12:16 PM   Patient seen, examined. Available data reviewed. Agree with findings, assessment, and plan as outlined by ANeva Seat MD. the patient is personally interviewed and examined.  He is heavily sedated for treatment of probable alcohol withdrawal.  He is unable to provide any history.  On my examination, he is sedated, in no distress.  Lungs are rhonchorous bilaterally, JVP is normal, there are no carotid bruits, heart is regular rate and rhythm with no murmur or gallop, abdomen is soft without mass, extremities show no edema.  The patient's EKG is reviewed and shows no acute ST segment changes.  Troponin is elevated as above.  The patient does not appear to have any symptoms of acute coronary syndrome.  He has acute kidney injury, DKA, and leukocytosis.  His echocardiogram is reviewed and shows normal LV systolic function without any regional Holland motion abnormalities.  He is not a candidate for invasive cardiac evaluation and I would recommend treating him in a supportive manner with medical therapy.  I do not think there is any role for systemic anticoagulation with heparin as there is suspicion for true ACS is low.  It is reasonable to trend his cardiac enzymes.  Otherwise would treat his medical problems accordingly.  MSherren Mocha M.D. 02/04/2018  4:05 PM

## 2018-02-04 NOTE — ED Notes (Signed)
Dr. Tyrell Antonio paged for orders to dc insulin drip and start 1/2  Normal saline drip

## 2018-02-04 NOTE — ED Notes (Signed)
Pt placed in hospital bed for comfort with bed alarm set

## 2018-02-04 NOTE — Care Management (Signed)
CM received consult for medication management - pt has active insurance and therefore CM can not offer payment assistance for PTA medication.  CM will continue to follow for discharge needs

## 2018-02-04 NOTE — ED Notes (Signed)
Patient continues to remove monitoring equipment and condom cath. Patient swatting and grabbing at staff upon staff attempting to do any kind of patient care.

## 2018-02-04 NOTE — Progress Notes (Addendum)
PROGRESS NOTE    [name]  TFT:[id number] DOB: [birth year] DOA: 02/03/2018 PCP: [name], MD    Brief Narrative:  [name] is a 62 y.o. male with medical history significant for polysubstance abuse including history of IV drug abuse, insulin-dependent diabetes mellitus, chronic hepatitis C status post treatment, peripheral arterial disease with foot ulcer, hypertension, and nonadherence to his treatment plan, now presenting to the emergency department after falling and hitting his head.  Patient reports that he became lightheaded while walking, believes he passed out, and fell.  He reports pain to his head and neck after this.  He was just discharged from the hospital 3 days ago after admission for recurrent syncope that was likely secondary to hypovolemia.  He reports that he has not been taking his medications due to financial constraints.  He denies any chest pain or palpitations.  Reports some generalized abdominal discomfort with nausea and several episodes of vomiting.  He denies hematemesis, melena, or hematochezia.  He denies fevers, chills, cough, or dysuria.  ED Course: Upon arrival to the ED, patient is found to be afebrile, saturating well on room air, slightly tachycardic, and with stable blood pressure.  Noncontrast head CT is negative for acute intracranial abnormality and no acute cervical spine fracture is noted on CT.  Chemistry panel is notable for glucose of 1082, bicarbonate 13, anion gap 33, and creatinine of 3.13, up from 1.40 the day before.  CBC features a mild normocytic anemia, leukocytosis, and thrombocytosis.  Urinalysis is notable for glucosuria, ketonuria, and proteinuria.  Patient was given a liter of lactated Ringer's, fentanyl, and started on insulin infusion.  He remains hemodynamically stable and will be admitted for ongoing evaluation and management of DKA with acute renal failure and possible syncopal episode.   Assessment & Plan:   Principal  Problem:   DKA (diabetic ketoacidoses) ([place]) Active Problems:   COPD, mild ([place])   Acute kidney injury ([place])   Essential hypertension   Syncope   Leukocytosis   1-DKA; presents with DKA, gap elevated at 33, bicarb 13. CBG 1000 He was started on IV fluids, insulin gtt.  Gap close. Transition to lantus.   2-Acute encephalopathy, alcohol withdrawal.  Patient with elevated CIWA at 21. He received multiple doses of ativan.  Intermittent agitation and lethargic.  CCM consulted, for precedex.  Check ABG. UDS, alcohol level.  Transfer to ICU, CCM will take over care.   3-Acute renal failure;  In setting DKA, hypovolemia.  IV fluids.   4-Abnormal EKG, ST depression.  Cycle cardiac enzymes.  Troponin came back at 2. 7 Cardiology consulted.   5-Leukocytosis, concern for aspiration PNA;  Start Unasyn.   Hypokalemia; replete kcl    DVT prophylaxis: heparin.  Code Status: full code.  Family Communication: no family at bedside.  Disposition Plan: remain in the step down unit, needs IV fluids, IV antibiotics.   Consultants:   none   Procedures:   Renal US.   Antimicrobials: Unasyn 10-23  Subjective: Lethargic, follows some command, grip hands, moves extremities passively. Received IV ativan at 6 am.    Objective: Vitals:   02/04/18 0630 02/04/18 0700 02/04/18 0730 02/04/18 0800  BP: (!) 127/50 (!) 132/51 (!) 139/54 (!) 133/56  Pulse: 84 89 88 87  Resp: (!) 25 (!) 27 17   Temp:      SpO2: 100% 93% 98% 99%  Weight:      Height:        Intake/Output Summary (  Last 24 hours) at 02/04/2018 0805 Last data filed at 02/04/2018 0535 Gross per 24 hour  Intake 2255 ml  Output -  Net 2255 ml   Filed Weights   02/03/18 2009  Weight: 71.4 kg    Examination:  General exam: lethargic  Respiratory system: bilateral ronchus.  Cardiovascular system: S1 & S2 heard, RRR. No JVD, murmurs, rubs, gallops or clicks. No pedal edema. Gastrointestinal system: Abdomen is  nondistended, soft and nontender. No organomegaly or masses felt. Normal bowel sounds heard. Central nervous system: lethargic, he is able to follow intermittent command, able to grip my fingers.  Extremities: Symmetric 5 x 5 power. Skin: No rashes, lesions or ulcers    Data Reviewed: I have personally reviewed following labs and imaging studies  CBC: Recent Labs  Lab 01/29/18 1942 01/30/18 0922 02/02/18 1342 02/03/18 2005 02/04/18 0548  WBC 9.5 13.3* 10.9* 26.5* 23.9*  NEUTROABS 7.5  --   --  24.4*  --   HGB 13.5 12.1* 13.0 11.5* 11.1*  HCT 40.4 35.7* 39.3 37.5* 33.3*  MCV 90.6 89.7 91.2 98.4 89.0  PLT 350 369 322 426* 604   Basic Metabolic Panel: Recent Labs  Lab 01/29/18 1942  01/30/18 0922  01/31/18 0612 02/02/18 1342 02/03/18 2005 02/04/18 0127 02/04/18 0548  NA 129*  --  135  --  140 134* 135 143 146*  K 5.5*   < > 4.5  --  4.4 4.3 5.0 3.3* 3.4*  CL 99  --  103  --  108 100 89* 100 104  CO2 18*  --  23  --  23 25 13* 25 32  GLUCOSE 681*  --  420*   < > 47* 572* 1,082* 442* 227*  BUN 30*  --  20  --  18 29* 53* 52* 52*  CREATININE 1.54*  --  1.28*  --  1.15 1.40* 3.13* 2.69* 2.24*  CALCIUM 9.0  --  8.5*  --  8.8* 9.0 8.8* 9.0 9.0  MG 2.2  --  1.8  --   --   --   --   --   --   PHOS  --   --  3.2  --   --   --   --   --   --    < > = values in this interval not displayed.   GFR: Estimated Creatinine Clearance: 35 mL/min (A) (by C-G formula based on SCr of 2.24 mg/dL (H)). Liver Function Tests: Recent Labs  Lab 01/29/18 1942 01/31/18 0612 02/03/18 2005  AST 30 15 27   ALT 22 13 23   ALKPHOS 112 76 115  BILITOT 1.9* 0.3 2.0*  PROT 6.0* 5.3* 6.5  ALBUMIN 2.9* 2.3* 3.1*   Recent Labs  Lab 01/29/18 1942  LIPASE 27   No results for input(s): AMMONIA in the last 168 hours. Coagulation Profile: No results for input(s): INR, PROTIME in the last 168 hours. Cardiac Enzymes: Recent Labs  Lab 01/30/18 0922 01/30/18 1143  TROPONINI <0.03 <0.03   BNP  (last 3 results) No results for input(s): PROBNP in the last 8760 hours. HbA1C: No results for input(s): HGBA1C in the last 72 hours. CBG: Recent Labs  Lab 02/04/18 0315 02/04/18 0419 02/04/18 0526 02/04/18 0632 02/04/18 0758  GLUCAP 308* 265* 233* 182* 128*   Lipid Profile: No results for input(s): CHOL, HDL, LDLCALC, TRIG, CHOLHDL, LDLDIRECT in the last 72 hours. Thyroid Function Tests: No results for input(s): TSH, T4TOTAL, FREET4, T3FREE, THYROIDAB in the  last 72 hours. Anemia Panel: No results for input(s): VITAMINB12, FOLATE, FERRITIN, TIBC, IRON, RETICCTPCT in the last 72 hours. Sepsis Labs: Recent Labs  Lab 01/29/18 2001 01/29/18 2315  LATICACIDVEN 2.04* 1.69    Recent Results (from the past 240 hour(s))  Urine culture     Status: None   Collection Time: 01/29/18  7:42 PM  Result Value Ref Range Status   Specimen Description URINE, RANDOM  Final   Special Requests NONE  Final   Culture   Final    NO GROWTH Performed at [facility], [address] [place], [place] [address]    Report Status 01/31/2018 FINAL  Final  MRSA PCR Screening     Status: None   Collection Time: 01/30/18  7:10 AM  Result Value Ref Range Status   MRSA by PCR NEGATIVE NEGATIVE Final    Comment:        The GeneXpert MRSA Assay (FDA approved for NASAL specimens only), is one component of a comprehensive MRSA colonization surveillance program. It is not intended to diagnose MRSA infection nor to guide or monitor treatment for MRSA infections. Performed at [facility], [place] [address], [place], [place] [address]          Radiology Studies: Dg Chest 2 View  Result Date: 02/03/2018 CLINICAL DATA:  Syncope EXAM: CHEST - 2 VIEW COMPARISON:  01/29/2018, 12/23/2017 FINDINGS: The heart size and mediastinal contours are within normal limits. Both lungs are clear. No pneumothorax. IMPRESSION: No active cardiopulmonary disease. Electronically Signed   By: Donavan Foil M.D.    On: 02/03/2018 20:03   Ct Head Wo Contrast  Result Date: 02/03/2018 CLINICAL DATA:  Ct head/cspine wo, Pt has been walking in the rain today trying to get home after a dc from hospital, pt is cold to touch. Pt has abrasion to forehead, he fell face forwards onto cement. Pt has neck back and head pain 10/10. EXAM: CT HEAD WITHOUT CONTRAST CT CERVICAL SPINE WITHOUT CONTRAST TECHNIQUE: Multidetector CT imaging of the head and cervical spine was performed following the standard protocol without intravenous contrast. Multiplanar CT image reconstructions of the cervical spine were also generated. COMPARISON:  CT of the head on 01/30/2018 FINDINGS: CT HEAD FINDINGS Brain: There is central and cortical atrophy. Periventricular white matter changes are consistent with small vessel disease. The basilar cisterns and ventricles have a normal appearance. There is no CT evidence for acute infarction or hemorrhage. A 4 millimeter calcified lesion is identified along the INNER table of the RIGHT frontal bone, consistent with small meningioma. There is no associated edema or mass effect. The appearance is stable. Vascular: There is dense atherosclerotic calcification of the internal carotid arteries. Skull: Normal. Negative for fracture or focal lesion. Sinuses/Orbits: Soft tissue swelling, polyp, or mucous retention cyst within the LEFT maxillary sinus. Chronic deformity of the nasal bones consistent with remote fracture. Other: None CT CERVICAL SPINE FINDINGS Alignment: Normal. Skull base and vertebrae: No acute fracture. No primary bone lesion or focal pathologic process. Soft tissues and spinal canal: There is atherosclerotic calcification of the vertebral and carotid arteries. Spinal canal is unremarkable. Disc levels:  Disc height loss and uncovertebral spurring at C6-7. Upper chest: Negative. Other: None IMPRESSION: 1. Atrophy and small vessel disease. 2.  No evidence for acute intracranial abnormality. 3. Remote  fractures of the nasal bones. 4. Stable 4 millimeter calcified meningioma RIGHT frontal region not associated with edema or mass effect. 5. Mid cervical degenerative changes without  evidence for acute cervical spine fracture. Electronically Signed   By: Nolon Nations M.D.   On: 02/03/2018 20:29   Ct Cervical Spine Wo Contrast  Result Date: 02/03/2018 CLINICAL DATA:  Ct head/cspine wo, Pt has been walking in the rain today trying to get home after a dc from hospital, pt is cold to touch. Pt has abrasion to forehead, he fell face forwards onto cement. Pt has neck back and head pain 10/10. EXAM: CT HEAD WITHOUT CONTRAST CT CERVICAL SPINE WITHOUT CONTRAST TECHNIQUE: Multidetector CT imaging of the head and cervical spine was performed following the standard protocol without intravenous contrast. Multiplanar CT image reconstructions of the cervical spine were also generated. COMPARISON:  CT of the head on 01/30/2018 FINDINGS: CT HEAD FINDINGS Brain: There is central and cortical atrophy. Periventricular white matter changes are consistent with small vessel disease. The basilar cisterns and ventricles have a normal appearance. There is no CT evidence for acute infarction or hemorrhage. A 4 millimeter calcified lesion is identified along the INNER table of the RIGHT frontal bone, consistent with small meningioma. There is no associated edema or mass effect. The appearance is stable. Vascular: There is dense atherosclerotic calcification of the internal carotid arteries. Skull: Normal. Negative for fracture or focal lesion. Sinuses/Orbits: Soft tissue swelling, polyp, or mucous retention cyst within the LEFT maxillary sinus. Chronic deformity of the nasal bones consistent with remote fracture. Other: None CT CERVICAL SPINE FINDINGS Alignment: Normal. Skull base and vertebrae: No acute fracture. No primary bone lesion or focal pathologic process. Soft tissues and spinal canal: There is atherosclerotic calcification  of the vertebral and carotid arteries. Spinal canal is unremarkable. Disc levels:  Disc height loss and uncovertebral spurring at C6-7. Upper chest: Negative. Other: None IMPRESSION: 1. Atrophy and small vessel disease. 2.  No evidence for acute intracranial abnormality. 3. Remote fractures of the nasal bones. 4. Stable 4 millimeter calcified meningioma RIGHT frontal region not associated with edema or mass effect. 5. Mid cervical degenerative changes without evidence for acute cervical spine fracture. Electronically Signed   By: Nolon Nations M.D.   On: 02/03/2018 20:29   Ct Abdomen Pelvis W Contrast  Result Date: 02/02/2018 CLINICAL DATA:  Fall down stairs, generalized pain. Diverticulitis suspected. EXAM: CT ABDOMEN AND PELVIS WITH CONTRAST TECHNIQUE: Multidetector CT imaging of the abdomen and pelvis was performed using the standard protocol following bolus administration of intravenous contrast. CONTRAST:  148mL ISOVUE-300 IOPAMIDOL (ISOVUE-300) INJECTION 61% COMPARISON:  CT pelvis dated 02/23/2005. MRI abdomen dated 06/15/2004. FINDINGS: Lower chest: Lung bases are clear. Hepatobiliary: Stable mass within the LEFT liver lobe, compatible with benign hemangioma. No suspicious mass or lesion within the liver. No hepatic injury or perihepatic hematoma. Gallbladder appears normal. No bile duct dilatation. Pancreas: Unremarkable. No pancreatic ductal dilatation or surrounding inflammatory changes. Spleen: No splenic injury or perisplenic hematoma. Adrenals/Urinary Tract: Adrenal glands are unremarkable. 3 mm nonobstructing RIGHT renal stone. Kidneys otherwise unremarkable without suspicious mass, stone or hydronephrosis. No perinephric hemorrhage. Bladder appears normal, moderately distended. Stomach/Bowel: No dilated large or small bowel loops. No evidence of bowel Rikard inflammation or bowel Saban injury. Fairly large amount of stool throughout the nondistended colon. Appendix is normal. Stomach is  unremarkable, partially decompressed. Vascular/Lymphatic: Aortic atherosclerosis. No enlarged abdominal or pelvic lymph nodes. Reproductive: Prostate is unremarkable. Other: No free fluid or hemorrhage within the abdomen or pelvis. No free intraperitoneal air. Musculoskeletal: Mild degenerative spondylosis of the lumbar spine. No acute appearing osseous abnormality. No evidence of acute fracture  or subluxation within the lumbar spine or osseous pelvis. IMPRESSION: 1. No acute findings within the abdomen or pelvis. No evidence of solid organ injury. No free fluid or hemorrhage. No bowel obstruction or evidence of bowel Clopper inflammation. Appendix is normal. No osseous fracture or dislocation. 2. Fairly large amount of stool throughout the nondistended colon (constipation?). 3. Stable benign hemangioma within the LEFT liver lobe. 4.  Aortic Atherosclerosis (ICD10-I70.0). 5. 3 mm nonobstructing RIGHT renal stone. Electronically Signed   By: Franki Cabot M.D.   On: 02/02/2018 17:00        Scheduled Meds: . aspirin EC  81 mg Oral Daily  . famotidine  20 mg Oral Daily  . folic acid  1 mg Oral Daily  . heparin  5,000 Units Subcutaneous Q8H  . insulin glargine  10 Units Subcutaneous Daily  . multivitamin with minerals  1 tablet Oral Daily  . nicotine  14 mg Transdermal Daily  . thiamine  100 mg Oral Daily   Or  . thiamine  100 mg Intravenous Daily   Continuous Infusions: . dextrose 5 % and 0.45% NaCl 75 mL/hr at 02/04/18 0538  . insulin 7.3 Units/hr (02/04/18 0634)     LOS: 1 day    Time spent: 35 minutes.     Elmarie Shiley, MD [name] Pager [phone]  If 7PM-7AM, please contact night-coverage [email or web address] Password [email or web address] 02/04/2018, 8:05 AM

## 2018-02-04 NOTE — Progress Notes (Signed)
Pharmacy Antibiotic Note  [name] is a 61 y.o. male admitted on 02/03/2018 with pneumonia.  Pharmacy has been consulted for unasyn dosing. Pt is afebrile and WBC is elevated at 23.9. SCr is elevated at 2.24.   Plan: Unasyn 3gm IV Q8H F/u renal fxn, C&S, clinical status  Height: 6' (182.9 cm) Weight: 157 lb 6.5 oz (71.4 kg) IBW/kg (Calculated) : 77.6  Temp (24hrs), Avg:97.6 F (36.4 C), Min:97.6 F (36.4 C), Max:97.6 F (36.4 C)  Recent Labs  Lab 01/29/18 1942 01/29/18 2001 01/29/18 2315 01/30/18 0922 01/31/18 0612 02/02/18 1342 02/03/18 2005 02/04/18 0127 02/04/18 0548  WBC 9.5  --   --  13.3*  --  10.9* 26.5*  --  23.9*  CREATININE 1.54*  --   --  1.28* 1.15 1.40* 3.13* 2.69* 2.24*  LATICACIDVEN  --  2.04* 1.69  --   --   --   --   --   --     Estimated Creatinine Clearance: 35 mL/min (A) (by C-G formula based on SCr of 2.24 mg/dL (H)).    Allergies  Allergen Reactions  . Codeine Itching    Tolerates hydrocodone/apap    Antimicrobials this admission: Unasyn 10/23>>  Dose adjustments this admission: N/A  Microbiology results: Pending  Thank you for allowing pharmacy to be a part of this patient's care.  Georgine Wiltse, Rande Lawman 02/04/2018 8:18 AM

## 2018-02-04 NOTE — Progress Notes (Addendum)
Inpatient Diabetes Program Recommendations  AACE/ADA: New Consensus Statement on Inpatient Glycemic Control (2015)  Target Ranges:  Prepandial:   less than 140 mg/dL      Peak postprandial:   less than 180 mg/dL (1-2 hours)      Critically ill patients:  140 - 180 mg/dL   Lab Results  Component Value Date   GLUCAP 59 (L) 02/04/2018   HGBA1C 10.1 (H) 01/30/2018    Review of Glycemic Control Results for INGRAM, ONNEN (MRN [id number]) as of 02/04/2018 13:38  Ref. Range 02/03/2018 18:50 02/03/2018 23:07 02/04/2018 00:24 02/04/2018 02:01 02/04/2018 03:15 02/04/2018 04:19 02/04/2018 05:26 02/04/2018 06:32 02/04/2018 07:58 02/04/2018 09:03 02/04/2018 10:12 02/04/2018 11:57 02/04/2018 13:27  Glucose-Capillary Latest Ref Range: 70 - 99 mg/dL >600 (HH) >600 (HH) 484 (H) 354 (H) 308 (H) 265 (H) 233 (H) 182 (H) 128 (H) 78 75 79 59 (L)   Diabetes history: Type 1 DM  Outpatient Diabetes medications: NPH 15 units q AM Current orders for Inpatient glycemic control: Novolog moderate q 4 hours, Lantus 10 units daily Inpatient Diabetes Program Recommendations:    Note patients blood sugar>1000 on admit.  He was just d/c'd from hospital on 01/31/18.  He then returned to ED on 02/02/18 and was d/c'd the same day.  He does appear to be very sensitive to insulin with a recent low.  Please reduce Novolog to sensitive q 4 hours.  Will need social work and case management involvement at discharge.   Thanks,  Adah Perl, RN, BC-ADM Inpatient Diabetes Coordinator Pager [phone] (8a-5p)

## 2018-02-04 NOTE — ED Notes (Signed)
Patient removed IV in L forearm, condom catheter and cardiac leads. New condom cath applied and patient placed back on cardiac leads

## 2018-02-05 DIAGNOSIS — N179 Acute kidney failure, unspecified: Secondary | ICD-10-CM

## 2018-02-05 DIAGNOSIS — I248 Other forms of acute ischemic heart disease: Secondary | ICD-10-CM | POA: Diagnosis present

## 2018-02-05 LAB — CBC
HEMATOCRIT: 31.9 % — AB (ref 39.0–52.0)
Hemoglobin: 10.5 g/dL — ABNORMAL LOW (ref 13.0–17.0)
MCH: 30.2 pg (ref 26.0–34.0)
MCHC: 32.9 g/dL (ref 30.0–36.0)
MCV: 91.7 fL (ref 80.0–100.0)
NRBC: 0 % (ref 0.0–0.2)
Platelets: 305 10*3/uL (ref 150–400)
RBC: 3.48 MIL/uL — ABNORMAL LOW (ref 4.22–5.81)
RDW: 13.7 % (ref 11.5–15.5)
WBC: 15.5 10*3/uL — ABNORMAL HIGH (ref 4.0–10.5)

## 2018-02-05 LAB — UREA NITROGEN, URINE: Urea Nitrogen, Ur: 187 mg/dL

## 2018-02-05 LAB — BASIC METABOLIC PANEL
ANION GAP: 4 — AB (ref 5–15)
ANION GAP: 6 (ref 5–15)
BUN: 33 mg/dL — AB (ref 8–23)
BUN: 25 mg/dL — ABNORMAL HIGH (ref 8–23)
CALCIUM: 8.7 mg/dL — AB (ref 8.9–10.3)
CO2: 32 mmol/L (ref 22–32)
CO2: 28 mmol/L (ref 22–32)
Calcium: 8.9 mg/dL (ref 8.9–10.3)
Chloride: 110 mmol/L (ref 98–111)
Chloride: 112 mmol/L — ABNORMAL HIGH (ref 98–111)
Creatinine, Ser: 1.45 mg/dL — ABNORMAL HIGH (ref 0.61–1.24)
Creatinine, Ser: 1.08 mg/dL (ref 0.61–1.24)
GFR calc Af Amer: 59 mL/min — ABNORMAL LOW (ref >60)
GFR calc Af Amer: >60 mL/min (ref >60)
GFR calc non Af Amer: 51 mL/min — ABNORMAL LOW (ref >60)
GFR calc non Af Amer: >60 mL/min (ref >60)
GLUCOSE: 108 mg/dL — AB (ref 70–99)
GLUCOSE: 86 mg/dL (ref 70–99)
POTASSIUM: 4 mmol/L (ref 3.5–5.1)
POTASSIUM: 4 mmol/L (ref 3.5–5.1)
Sodium: 146 mmol/L — ABNORMAL HIGH (ref 135–145)
Sodium: 146 mmol/L — ABNORMAL HIGH (ref 135–145)

## 2018-02-05 LAB — GLUCOSE, CAPILLARY
GLUCOSE-CAPILLARY: 84 mg/dL (ref 70–99)
GLUCOSE-CAPILLARY: 87 mg/dL (ref 70–99)
GLUCOSE-CAPILLARY: 68 mg/dL — AB (ref 70–99)
Glucose-Capillary: 104 mg/dL — ABNORMAL HIGH (ref 70–99)

## 2018-02-05 MED ORDER — DEXTROSE 50 % IV SOLN
INTRAVENOUS | Status: AC
  Administered 2018-02-05: 50 mL
  Filled 2018-02-05: qty 50

## 2018-02-05 MED ORDER — DEXTROSE-NACL 5-0.45 % IV SOLN
INTRAVENOUS | Status: DC
  Administered 2018-02-05 – 2018-02-07 (×4): via INTRAVENOUS

## 2018-02-05 NOTE — Progress Notes (Signed)
Hypoglycemic Event  CBG: 63  Treatment: D50 IV 25 mL  Symptoms: None  Follow-up CBG: Time:2357 CBG Result:125  Possible Reasons for Event: Unknown      [name]

## 2018-02-05 NOTE — Progress Notes (Signed)
CSW acknowledges consult for homeless issues. CSW went to speak with pt at bedside. Pt sleeping at this time. CSW will try back later to assess pt for further needs.      [name], MSW, [facility] Clinical Social Worker [phone]

## 2018-02-05 NOTE — Progress Notes (Signed)
NAME:  [name], MRN:  [id number], DOB:  1956/03/14, LOS: 2 ADMISSION DATE:  02/03/2018, CONSULTATION DATE:  10/23 REFERRING MD:  Tyrell Antonio, CHIEF COMPLAINT:  confusion   Brief History   62 y/o male with DKA and acute encephalopathy from EtOH withdrawal.  Also had NSTEMI.  Past Medical History  DM2, Hep C, glaucoma, depression, vitiligo Significant Hospital Events   10/22 admitted with DKA, AKI, volume depletion after a fall.  Diagnostic imaging negative for acute findings on CT head or C-spine.  Was alert and oriented on admission 10/23: Anion gap closed overnight, renal function improved some, however became progressively encephalopathic overnight requiring escalating Lorazepam critical care asked to see given concern about mental status, airway protection, and critical care services> moved to ICU for precedex 10/24 overnight had some agitation requiring precedex  Consults: date of consult/date signed off & final recs:  PCCM 10/23  Procedures (surgical and bedside):    Significant Diagnostic Tests:  CT head and C-spine 10/22 both negative for acute injury Renal US 10/23>>>  Micro Data:  10/23 urine culture: Negative 10/23 blood culture>>>  Antimicrobials:  10/23 Unasyn>>>  Subjective:  10/24 overnight had some agitation requiring precedex  Objective   Blood pressure (!) 154/71, pulse 65, temperature (!) 97.5 F (36.4 C), temperature source Axillary, resp. rate 14, height 6' (1.829 m), weight 67.6 kg, SpO2 100 %.        Intake/Output Summary (Last 24 hours) at 02/05/2018 1240 Last data filed at 02/05/2018 1200 Gross per 24 hour  Intake 3100.44 ml  Output 1675 ml  Net 1425.44 ml   Filed Weights   02/03/18 2009 02/04/18 1317 02/05/18 0100  Weight: 71.4 kg 68.3 kg 67.6 kg    Examination:  General:  Resting comfortably in bed HENT: NCAT OP clear PULM: CTA B, normal effort CV: RRR, no mgr GI: BS+, soft, nontender MSK: normal bulk and tone Neuro: somnolent,  sleeping on my exam, intermittently wakes and yells out   Resolved Hospital Problem list   DKA  Assessment & Plan:  DM2 with resolved DKA > continue to monitor blood glucose > glargine 10 and sliding scale  Hypoglycemia > change to D5 1/2 NS 100c/hr > advance diet when available  Severe acute toxic metabolic encephalopathy from EtOH withdrawal (presumed) > maintain in ICU for continuous precedex infusion > wean off precedex as able per ICU EtOH withdrawal protocol > MRI/Neuro work up if not improving over 2-3 days  AKI> improving > Monitor BMET and UOP > Replace electrolytes as needed  Aspiration into airway: unclear, CXR clear > stop unasyn and monitor   Chronic anemia > trend CBC   Disposition / Summary of Today's Plan 02/05/18   Read above     Diet: NPO Pain/Anxiety/Delirium protocol (if indicated): ICU EtOH withdrawal algorithm VAP protocol (if indicated): Not indicated DVT prophylaxis: sub q hep GI prophylaxis: n/a Hyperglycemia protocol: SSI Mobility: bedrest Code Status: full Family Communication: pending  Labs   CBC: Recent Labs  Lab 01/29/18 1942 01/30/18 0922 02/02/18 1342 02/03/18 2005 02/04/18 0548 02/05/18 0322  WBC 9.5 13.3* 10.9* 26.5* 23.9* 15.5*  NEUTROABS 7.5  --   --  24.4*  --   --   HGB 13.5 12.1* 13.0 11.5* 11.1* 10.5*  HCT 40.4 35.7* 39.3 37.5* 33.3* 31.9*  MCV 90.6 89.7 91.2 98.4 89.0 91.7  PLT 350 369 322 426* 392 588    Basic Metabolic Panel: Recent Labs  Lab 01/29/18 1942  01/30/18 0922  02/02/18  1342 02/03/18 2005 02/04/18 0127 02/04/18 0548 02/05/18 0322  NA 129*  --  135   < > 134* 135 143 146* 146*  K 5.5*   < > 4.5   < > 4.3 5.0 3.3* 3.4* 4.0  CL 99  --  103   < > 100 89* 100 104 110  CO2 18*  --  23   < > 25 13* 25 32 32  GLUCOSE 681*  --  420*   < > 572* 1,082* 442* 227* 108*  BUN 30*  --  20   < > 29* 53* 52* 52* 33*  CREATININE 1.54*  --  1.28*   < > 1.40* 3.13* 2.69* 2.24* 1.45*  CALCIUM 9.0  --   8.5*   < > 9.0 8.8* 9.0 9.0 8.7*  MG 2.2  --  1.8  --   --   --   --   --   --   PHOS  --   --  3.2  --   --   --   --   --   --    < > = values in this interval not displayed.   GFR: Estimated Creatinine Clearance: 51.2 mL/min (A) (by C-G formula based on SCr of 1.45 mg/dL (H)). Recent Labs  Lab 01/29/18 2001 01/29/18 2315  02/02/18 1342 02/03/18 2005 02/04/18 0548 02/05/18 0322  WBC  --   --    < > 10.9* 26.5* 23.9* 15.5*  LATICACIDVEN 2.04* 1.69  --   --   --   --   --    < > = values in this interval not displayed.    Liver Function Tests: Recent Labs  Lab 01/29/18 1942 01/31/18 0612 02/03/18 2005  AST 30 15 27   ALT 22 13 23   ALKPHOS 112 76 115  BILITOT 1.9* 0.3 2.0*  PROT 6.0* 5.3* 6.5  ALBUMIN 2.9* 2.3* 3.1*   Recent Labs  Lab 01/29/18 1942  LIPASE 27   No results for input(s): AMMONIA in the last 168 hours.  ABG    Component Value Date/Time   PHART 7.475 (H) 02/04/2018 0922   PCO2ART 50.0 (H) 02/04/2018 0922   PO2ART 119.0 (H) 02/04/2018 0922   HCO3 37.0 (H) 02/04/2018 0922   TCO2 39 (H) 02/04/2018 0922   ACIDBASEDEF 11.8 (H) 04/18/2016 1339   O2SAT 99.0 02/04/2018 0922     Coagulation Profile: No results for input(s): INR, PROTIME in the last 168 hours.  Cardiac Enzymes: Recent Labs  Lab 01/30/18 0922 01/30/18 1143 02/04/18 0913 02/04/18 1527 02/04/18 1953  TROPONINI <0.03 <0.03 2.70* 2.16* 1.93*    HbA1C: Hgb A1c MFr Bld  Date/Time Value Ref Range Status  01/30/2018 09:22 AM 10.1 (H) 4.8 - 5.6 % Final    Comment:    (NOTE) Pre diabetes:          5.7%-6.4% Diabetes:              >6.4% Glycemic control for   <7.0% adults with diabetes   12/24/2017 03:25 AM 9.5 (H) 4.8 - 5.6 % Final    Comment:    (NOTE) Pre diabetes:          5.7%-6.4% Diabetes:              >6.4% Glycemic control for   <7.0% adults with diabetes     CBG: Recent Labs  Lab 02/04/18 2326 02/04/18 2357 02/05/18 0327 02/05/18 0750 02/05/18 1105  GLUCAP  63* 125* 104*  84 87   My cc time 31 minutes  Roselie Awkward, MD [facility] Pager: [phone] Cell: [phone] After 3pm or if no response, call [phone]

## 2018-02-06 DIAGNOSIS — T07XXXA Unspecified multiple injuries, initial encounter: Secondary | ICD-10-CM

## 2018-02-06 LAB — URINE CULTURE: Culture: 10000 — AB

## 2018-02-06 LAB — GLUCOSE, CAPILLARY
GLUCOSE-CAPILLARY: 121 mg/dL — AB (ref 70–99)
GLUCOSE-CAPILLARY: 137 mg/dL — AB (ref 70–99)
GLUCOSE-CAPILLARY: 182 mg/dL — AB (ref 70–99)
Glucose-Capillary: 128 mg/dL — ABNORMAL HIGH (ref 70–99)
Glucose-Capillary: 147 mg/dL — ABNORMAL HIGH (ref 70–99)
Glucose-Capillary: 151 mg/dL — ABNORMAL HIGH (ref 70–99)
Glucose-Capillary: 190 mg/dL — ABNORMAL HIGH (ref 70–99)

## 2018-02-06 LAB — BASIC METABOLIC PANEL
Anion gap: 8 (ref 5–15)
BUN: 14 mg/dL (ref 8–23)
CO2: 27 mmol/L (ref 22–32)
CREATININE: 0.97 mg/dL (ref 0.61–1.24)
Calcium: 8.9 mg/dL (ref 8.9–10.3)
Chloride: 110 mmol/L (ref 98–111)
GFR calc Af Amer: >60 mL/min (ref >60)
GLUCOSE: 125 mg/dL — AB (ref 70–99)
Potassium: 3.7 mmol/L (ref 3.5–5.1)
Sodium: 145 mmol/L (ref 135–145)

## 2018-02-06 LAB — MAGNESIUM: Magnesium: 2 mg/dL (ref 1.7–2.4)

## 2018-02-06 MED ORDER — VITAMIN B-1 100 MG PO TABS
100.0000 mg | ORAL_TABLET | Freq: Every day | ORAL | Status: DC
  Administered 2018-02-06 – 2018-02-16 (×11): 100 mg via ORAL
  Filled 2018-02-06 (×11): qty 1

## 2018-02-06 MED ORDER — ASPIRIN 81 MG PO CHEW
81.0000 mg | CHEWABLE_TABLET | Freq: Every day | ORAL | Status: DC
  Administered 2018-02-06 – 2018-02-16 (×11): 81 mg via ORAL
  Filled 2018-02-06 (×11): qty 1

## 2018-02-06 MED ORDER — HYDRALAZINE HCL 20 MG/ML IJ SOLN: 5.0000 mg | Freq: Four times a day (QID) | INTRAMUSCULAR | Status: DC | PRN

## 2018-02-06 MED ORDER — SODIUM CHLORIDE 0.9 % IV SOLN
INTRAVENOUS | Status: DC | PRN
  Administered 2018-02-06: 250 mL via INTRAVENOUS
  Administered 2018-02-07 – 2018-02-11 (×2): 1000 mL via INTRAVENOUS

## 2018-02-06 MED ORDER — AMLODIPINE BESYLATE 10 MG PO TABS
10.0000 mg | ORAL_TABLET | Freq: Every day | ORAL | Status: DC
  Administered 2018-02-06 – 2018-02-16 (×11): 10 mg via ORAL
  Filled 2018-02-06 (×11): qty 1

## 2018-02-06 MED ORDER — ADULT MULTIVITAMIN W/MINERALS CH
1.0000 | ORAL_TABLET | Freq: Every day | ORAL | Status: DC
  Administered 2018-02-06 – 2018-02-16 (×11): 1 via ORAL
  Filled 2018-02-06 (×11): qty 1

## 2018-02-06 MED ORDER — OXYCODONE-ACETAMINOPHEN 5-325 MG PO TABS
1.0000 | ORAL_TABLET | Freq: Four times a day (QID) | ORAL | Status: DC | PRN
  Administered 2018-02-06 – 2018-02-16 (×31): 1 via ORAL
  Filled 2018-02-06 (×33): qty 1

## 2018-02-06 MED ORDER — FOLIC ACID 1 MG PO TABS
1.0000 mg | ORAL_TABLET | Freq: Every day | ORAL | Status: DC
  Administered 2018-02-06 – 2018-02-16 (×11): 1 mg via ORAL
  Filled 2018-02-06 (×11): qty 1

## 2018-02-06 MED ORDER — LORAZEPAM 2 MG/ML IJ SOLN
0.5000 mg | INTRAMUSCULAR | Status: DC | PRN
  Administered 2018-02-08 – 2018-02-11 (×7): 0.5 mg via INTRAVENOUS
  Filled 2018-02-06 (×9): qty 1

## 2018-02-06 NOTE — Progress Notes (Signed)
CSW aware that pt is only oriented to person at this time. CSW will continue to follow pt to assess for further needs.    Virgie Dad Yusra Ravert, MSW, [facility] Clinical Social Worker [phone]

## 2018-02-06 NOTE — Progress Notes (Signed)
Progress Note  Patient Name: [name] Date of Encounter: 02/06/2018  Primary Cardiologist: No primary care provider on file.   Subjective   No history obtainable.  The patient remains lethargic.  Inpatient Medications    Scheduled Meds: . amLODipine  10 mg Oral Daily  . aspirin  81 mg Oral Daily  . folic acid  1 mg Oral Daily  . heparin  5,000 Units Subcutaneous Q8H  . [START ON 02/07/2018] Influenza vac split quadrivalent PF  0.5 mL Intramuscular Tomorrow-1000  . insulin aspart  0-9 Units Subcutaneous Q4H  . insulin glargine  10 Units Subcutaneous Daily  . mouth rinse  15 mL Mouth Rinse BID  . multivitamin with minerals  1 tablet Oral Daily  . nicotine  14 mg Transdermal Daily  . pneumococcal 23 valent vaccine  0.5 mL Intramuscular Tomorrow-1000  . thiamine  100 mg Oral Daily   Continuous Infusions: . sodium chloride Stopped (02/06/18 0241)  . dexmedetomidine (PRECEDEX) IV infusion Stopped (02/06/18 0800)  . dextrose 5 % and 0.45% NaCl 100 mL/hr at 02/06/18 1000   PRN Meds: sodium chloride, LORazepam   Vital Signs    Vitals:   02/06/18 0800 02/06/18 0803 02/06/18 0900 02/06/18 1000  BP: (!) 152/73  (!) 162/83 (!) 156/77  Pulse: (!) 57  73 68  Resp: 10  (!) 23 20  Temp:  98.1 F (36.7 C)    TempSrc:  Oral    SpO2: 98%  95% 96%  Weight:      Height:        Intake/Output Summary (Last 24 hours) at 02/06/2018 1051 Last data filed at 02/06/2018 1000 Gross per 24 hour  Intake 2669.1 ml  Output 1640 ml  Net 1029.1 ml   Filed Weights   02/03/18 2009 02/04/18 1317 02/05/18 0100  Weight: 71.4 kg 68.3 kg 67.6 kg    Telemetry    Normal sinus rhythm without significant arrhythmia- Personally Reviewed   Physical Exam  Chronically ill-appearing male, lethargic GEN: No acute distress.   Neck: No JVD Cardiac: RRR, no murmurs, rubs, or gallops.  Respiratory:  Coarse breath sounds bilaterally GI: Soft, nontender, non-distended  MS: No edema; No  deformity. Neuro:  Nonfocal, the patient does respond to verbal and touch stimuli, but continues to remain somewhat lethargic and is unable to provide any meaningful history  Labs    Chemistry Recent Labs  Lab 01/31/18 0612  02/03/18 2005  02/05/18 0322 02/05/18 1148 02/06/18 0324  NA 140   < > 135   < > 146* 146* 145  K 4.4   < > 5.0   < > 4.0 4.0 3.7  CL 108   < > 89*   < > 110 112* 110  CO2 23   < > 13*   < > 32 28 27  GLUCOSE 47*   < > 1,082*   < > 108* 86 125*  BUN 18   < > 53*   < > 33* 25* 14  CREATININE 1.15   < > 3.13*   < > 1.45* 1.08 0.97  CALCIUM 8.8*   < > 8.8*   < > 8.7* 8.9 8.9  PROT 5.3*  --  6.5  --   --   --   --   ALBUMIN 2.3*  --  3.1*  --   --   --   --   AST 15  --  27  --   --   --   --  ALT 13  --  23  --   --   --   --   ALKPHOS 76  --  115  --   --   --   --   BILITOT 0.3  --  2.0*  --   --   --   --   GFRNONAA >60   < > 20*   < > 51* >60 >60  GFRAA >60   < > 23*   < > 59* >60 >60  ANIONGAP 9   < > 33*   < > 4* 6 8   < > = values in this interval not displayed.     Hematology Recent Labs  Lab 02/03/18 2005 02/04/18 0548 02/05/18 0322  WBC 26.5* 23.9* 15.5*  RBC 3.81* 3.74* 3.48*  HGB 11.5* 11.1* 10.5*  HCT 37.5* 33.3* 31.9*  MCV 98.4 89.0 91.7  MCH 30.2 29.7 30.2  MCHC 30.7 33.3 32.9  RDW 13.3 13.1 13.7  PLT 426* 392 305    Cardiac Enzymes Recent Labs  Lab 01/30/18 1143 02/04/18 0913 02/04/18 1527 02/04/18 1953  TROPONINI <0.03 2.70* 2.16* 1.93*   No results for input(s): TROPIPOC in the last 168 hours.   BNPNo results for input(s): BNP, PROBNP in the last 168 hours.   DDimer No results for input(s): DDIMER in the last 168 hours.   Radiology    US Renal  Result Date: 02/04/2018 CLINICAL DATA:  62 year old male with acute renal failure. EXAM: RENAL / URINARY TRACT ULTRASOUND COMPLETE COMPARISON:  CT Abdomen and Pelvis 02/02/2018 the. Abdomen ultrasound 09/30/2016. FINDINGS: Right Kidney: Length: 10.9 centimeters.  Echogenicity at the upper limits of normal. No mass or hydronephrosis visualized. Right nephrolithiasis redemonstrated on image 20. Left Kidney: Length: 10.5 centimeters. Echogenicity at the upper limits of normal. No mass or hydronephrosis visualized. Bladder: Posterior bladder diverticulum redemonstrated. No urinary debris identified. IMPRESSION: 1. No acute renal findings. 2. Right nephrolithiasis.  Bladder diverticulum. Electronically Signed   By: Genevie Ann M.D.   On: 02/04/2018 16:07   Dg Chest Port 1 View  Result Date: 02/04/2018 CLINICAL DATA:  Syncope.  Hyperglycemia. EXAM: PORTABLE CHEST 1 VIEW COMPARISON:  February 03, 2018 FINDINGS: There is no edema or consolidation. There is mild left base atelectasis. Heart is upper normal in size with pulmonary vascularity normal. No adenopathy. There is a healed fracture of the lateral left fourth rib. IMPRESSION: Left base atelectasis. No edema or consolidation. Heart upper normal in size. Electronically Signed   By: Lowella Grip III M.D.   On: 02/04/2018 12:41    Cardiac Studies   2D Echo 01-30-2018: Study Conclusions  - Left ventricle: The cavity size was normal. Pasquini thickness was   normal. Systolic function was normal. The estimated ejection   fraction was in the range of 55% to 60%. [place] motion was normal;   there were no regional [place]i motion abnormalities. Doppler   parameters are consistent with abnormal left ventricular   relaxation (grade 1 diastolic dysfunction). The E/e&' ratio is   >15, suggesting elevated LV filling pressure. - Mitral valve: Calcified annulus. Mildly thickened leaflets .   There was trivial regurgitation. - Left atrium: The atrium was normal in size. - Inferior vena cava: The vessel was normal in size. The   respirophasic diameter changes were in the normal range (>= 50%),   consistent with normal central venous pressure.  Impressions:  - Compared to a prior study in 2018, the LVEF is lower, but  normal  at 55-60%, now with grade 1 DD and elevated LV filling pressure.  Patient Profile     62 y.o. male with cardiac enzyme evidence of demand ischemia in the context of severe toxic and metabolic derangements  Assessment & Plan    1.  Demand ischemia 2.  Encephalopathy, now off of Precedex 3.  DKA, glucose greater than 1000 on admission 4.  AKI, improving with volume resuscitation 5.  Polysubstance abuse  The patient's echocardiogram is reviewed and demonstrates normal LV systolic function with no regional Toney motion abnormalities.  I am unable to obtain any history regarding his cardiac symptoms because of his current mental status.  I do not think he requires any further inpatient cardiology evaluation.  We would be happy to see him in the future if any cardiac problems arise.  [facility] will sign off.   Medication Recommendations:  none Other recommendations (labs, testing, etc):  none Follow up as an outpatient:  As needed  For questions or updates, please contact [facility] Please consult [email or web address] for contact info under        Signed, Sherren Mocha, MD  02/06/2018, 10:51 AM

## 2018-02-06 NOTE — Progress Notes (Signed)
RT NOTE:  Pt has not worn CPAP in the past. RT explained CPAP to patient and he refused stating "I never done that before and don't want a mask on my face". RN aware. RT will monitor throughout night for desaturation and apnea episodes.

## 2018-02-06 NOTE — Clinical Social Work Note (Signed)
Clinical Social Work Assessment  Patient Details  Name: [name] MRN: [id number] Date of Birth: 1955-12-08  Date of referral:  02/06/18               Reason for consult:  Discharge Planning                Permission sought to share information with:  Other Permission granted to share information::  No  Name::     none given  Agency::  none given  Relationship::  none given   Contact Information:  none given   Housing/Transportation Living arrangements for the past 2 months:  Homeless Source of Information:  Patient Patient Interpreter Needed:  None Criminal Activity/Legal Involvement Pertinent to Current Situation/Hospitalization:  No - Comment as needed Significant Relationships:  None Lives with:  Self Do you feel safe going back to the place where you live?  No(pt is homeless.) Need for family participation in patient care:  Yes (Comment)  Care giving concerns:  CSW spoke with pt at bedside as CSW consulted for homeless issues.    Social Worker assessment / plan:  CSW spoke with pt at bedside to assess for further needs. CSW was informed by pt that pt is from [place] originally but moved here years ago with mom. Pt reports that pt has family but they are unable to help pt as they have their own medical issues.  CSW spoke with pt about living options and pt expressed "I don't wanna give my life away I still want some control over it". CSW expressed verbal understanding and expressed to pt that CSW's involved in care would only do what is asked of pt if it serves pt's long term ability to manage and care for self.   Employment status:  Disabled (Comment on whether or not currently receiving Disability) Insurance information:  Managed Medicare PT Recommendations:  Not assessed at this time Information / Referral to community resources:  Other (Comment Required)(community resoruces. )  Patient/Family's Response to care:  Pt appeared to be understanding and agreeable to plan of  care at this time.   Patient/Family's Understanding of and Emotional Response to Diagnosis, Current Treatment, and Prognosis:  No further questions or concerns have been expressed to CSW during this time.   Emotional Assessment Appearance:  Appears stated age Attitude/Demeanor/Rapport:  Engaged Affect (typically observed):  Accepting, Pleasant Orientation:  Oriented to Situation, Oriented to Self, Oriented to Place, Oriented to  Time Alcohol / Substance use:  Not Applicable Psych involvement (Current and /or in the community):  No (Comment)  Discharge Needs  Concerns to be addressed:  Discharge Planning Concerns, Cognitive Concerns, Homelessness Readmission within the last 30 days:  Yes Current discharge risk:  Dependent with Mobility, Lack of support system, Homeless Barriers to Discharge:  Continued Medical Work up   [employer], [place] 02/06/2018, 1:34 PM

## 2018-02-06 NOTE — Progress Notes (Signed)
NAME:  [name], MRN:  [id number], DOB:  08/12/1955, LOS: 3 ADMISSION DATE:  02/03/2018, CONSULTATION DATE: 10/23 REFERRING MD: Tyrell Antonio, CHIEF COMPLAINT: Acute encephalopathy  Brief History   62 year old male patient with history of medical noncompliance, polysubstance abuse, and diabetes.  Admitted 10/22 with hyperglycemia, diabetic ketoacidosis, dehydration and renal failure after a fall at home.  Initially was alert and oriented but became more encephalopathic overnight.  Critical care asked to see on 10/23 for progressive metabolic encephalopathy  Past Medical History  Diabetes, polysubstance abuse, hepatitis C, glaucoma, depression.  [facility] Events   10/22 admitted with DKA, AKI, dehydration after a fall.  Diagnostic imaging negative for acute findings on CT head or C-spine.  Was alert and oriented on admission 10/23: Anion gap closed overnight, renal function improved some, however became progressively encephalopathic overnight requiring escalating Lorazepam critical care asked to see given concern about mental status, airway protection, and critical care services  Consults: date of consult/date signed off & final recs:  Vertical care consulted 10/23 Procedures (surgical and bedside):    Significant Diagnostic Tests:  CT head and C-spine 10/22 both negative for acute injury Renal US 10/23>>>  Micro Data:  10/23 urine culture: Negative 10/23 blood culture>>>  Antimicrobials:  10/23 Unasyn>>>  Subjective:  Pt. Is off Precedex at present. He is following commands and answering questions. He is having periods of apnea / Cheyne Stokes respirations that last about 29-30 seconds frequently.Per nursing this is new since yesterday  Objective   Blood pressure (!) 152/73, pulse (!) 57, temperature 98.1 F (36.7 C), temperature source Oral, resp. rate 10, height 6' (1.829 m), weight 67.6 kg, SpO2 98 %.        Intake/Output Summary (Last 24 hours) at  02/06/2018 0823 Last data filed at 02/06/2018 0800 Gross per 24 hour  Intake 2773.61 ml  Output 2040 ml  Net 733.61 ml   Filed Weights   02/03/18 2009 02/04/18 1317 02/05/18 0100  Weight: 71.4 kg 68.3 kg 67.6 kg    Examination: General: Chronically ill-appearing 62 year old male is currently encephalopathic and agitated. HENT: Poor dentition mucous membranes dry,  no JVD, No LAD Lungs:Bilateral excursion,  Coarse throughout with scattered  rhonchi no accessory muscle use, apneas  Cardiovascular: S1, S2, RRR, NO MRG,NSR per monitor Abdomen: Soft nontender, ND, BS + Extremities:Warm and dry,  Multiple skin tears and bruising no edema, Right forehead abrasion Neuro: Awake, moves all extremities. Groggy off precedex  No focal deficits motor wise   Resolved Hospital Problem list    Diabetic ketoacidosis  Assessment & Plan:   Acute metabolic encephalopathy, assumably secondary to EtOH withdrawal, however also consider metabolic derangements Plan Continue other supportive care DC Lorazepam Wean Precedex infusion as able Admit to intensive care for closer observation with pulse oximetry and airway management such as suctioning as needed May need intubation  Sleep Apnea/ Cheyne Stokes 29-30 second periods of apnea Plan: Apnea alarm on Will need OP work up for sleep apnea Consider starting CPAP at HS as inpatient    AKI in the setting of volume depletion Plan Continuing IV hydration Renal dose medications Strict intake output Trend BMET  Fluid and electrolyte balance: Mild hypernatremia, and hypokalemia Plan Continue IV fluids with lactated Ringer's Electrolytes as needed Trend BMET  Diabetes with hyperglycemia Plan CBG's Q 4 Sliding scale insulin  Concern for aspiration event with rising leukocytosis Afebrile Protecting airway Plan Trend fever Procalcitonin algorithm Chest x-ray Unasyn discontinued 10/25  Chronic anemia Plan  Trend CBC Monitor for  bleeding  HTN BP  Up trending Not alert enough for home po's Plan Add Apresoline prn SBP > 160   Disposition / Summary of Today's Plan 02/06/18   Precedex is off Pt. With apnea while asleep>> Consider CPAP BP up trending>> Apresoline prn added  Consider transfer to tele bed    Diet: Clears with assist. Pain/Anxiety/Delirium protocol (if indicated): Precedex ICU algorithm started 10/23 VAP protocol (if indicated): Not indicated DVT prophylaxis: Subcu heparin GI prophylaxis: Not indicated Hyperglycemia protocol: Sliding scale insulin Mobility: Bedrest Code Status: Full code Family Communication: Pending  Labs   CBC: Recent Labs  Lab 01/30/18 0922 02/02/18 1342 02/03/18 2005 02/04/18 0548 02/05/18 0322  WBC 13.3* 10.9* 26.5* 23.9* 15.5*  NEUTROABS  --   --  24.4*  --   --   HGB 12.1* 13.0 11.5* 11.1* 10.5*  HCT 35.7* 39.3 37.5* 33.3* 31.9*  MCV 89.7 91.2 98.4 89.0 91.7  PLT 369 322 426* 392 841    Basic Metabolic Panel: Recent Labs  Lab 01/30/18 0922  02/04/18 0127 02/04/18 0548 02/05/18 0322 02/05/18 1148 02/06/18 0324  NA 135   < > 143 146* 146* 146* 145  K 4.5   < > 3.3* 3.4* 4.0 4.0 3.7  CL 103   < > 100 104 110 112* 110  CO2 23   < > 25 32 32 28 27  GLUCOSE 420*   < > 442* 227* 108* 86 125*  BUN 20   < > 52* 52* 33* 25* 14  CREATININE 1.28*   < > 2.69* 2.24* 1.45* 1.08 0.97  CALCIUM 8.5*   < > 9.0 9.0 8.7* 8.9 8.9  MG 1.8  --   --   --   --   --  2.0  PHOS 3.2  --   --   --   --   --   --    < > = values in this interval not displayed.   GFR: Estimated Creatinine Clearance: 76.5 mL/min (by C-G formula based on SCr of 0.97 mg/dL). Recent Labs  Lab 02/02/18 1342 02/03/18 2005 02/04/18 0548 02/05/18 0322  WBC 10.9* 26.5* 23.9* 15.5*    Liver Function Tests: Recent Labs  Lab 01/31/18 0612 02/03/18 2005  AST 15 27  ALT 13 23  ALKPHOS 76 115  BILITOT 0.3 2.0*  PROT 5.3* 6.5  ALBUMIN 2.3* 3.1*   No results for input(s): LIPASE, AMYLASE  in the last 168 hours. No results for input(s): AMMONIA in the last 168 hours.  ABG    Component Value Date/Time   PHART 7.475 (H) 02/04/2018 0922   PCO2ART 50.0 (H) 02/04/2018 0922   PO2ART 119.0 (H) 02/04/2018 0922   HCO3 37.0 (H) 02/04/2018 0922   TCO2 39 (H) 02/04/2018 0922   ACIDBASEDEF 11.8 (H) 04/18/2016 1339   O2SAT 99.0 02/04/2018 0922     Coagulation Profile: No results for input(s): INR, PROTIME in the last 168 hours.  Cardiac Enzymes: Recent Labs  Lab 01/30/18 0922 01/30/18 1143 02/04/18 0913 02/04/18 1527 02/04/18 1953  TROPONINI <0.03 <0.03 2.70* 2.16* 1.93*    HbA1C: Hgb A1c MFr Bld  Date/Time Value Ref Range Status  01/30/2018 09:22 AM 10.1 (H) 4.8 - 5.6 % Final    Comment:    (NOTE) Pre diabetes:          5.7%-6.4% Diabetes:              >6.4% Glycemic control for   <  7.0% adults with diabetes   12/24/2017 03:25 AM 9.5 (H) 4.8 - 5.6 % Final    Comment:    (NOTE) Pre diabetes:          5.7%-6.4% Diabetes:              >6.4% Glycemic control for   <7.0% adults with diabetes     CBG: Recent Labs  Lab 02/05/18 1105 02/05/18 1535 02/05/18 1909 02/05/18 2340 02/06/18 0351  GLUCAP 87 68* 147* 190* 128*    Admitting History of Present Illness.   Patient unable to provide HPI, information obtained from medical records and nursing staff.  62 year old male patient with a history of polysubstance abuse including IV drug abuse and EtOH also has a history of chronic hep C status post treatment and diabetes.  Presents to the hospital following a fall on 10/22, stated he thought he may have blacked out.  He was just discharged from the hospital 3 days prior from recurrent syncope felt secondary to hypovolemia.  He does not take medications due to financial constraints.  In the ER he was found to be afebrile, CT head was negative, CT spine was negative.  Blood chemistry showed glucose of 1082, bicarbonate of 13, anion gap of 33 and a creatinine of 3.13.   He was admitted with working diagnosis of diabetic ketoacidosis and dehydration.  He was placed on CIWA protocol given history of polysubstance abuse.  Over the course of the evening from 10/22 to 10/23 he became progressively encephalopathic and confused.  Requiring PRN Ativan.  His anion gap was successfully closed, however  he did demonstrate worsening work of breathing and rhonchus respiratory efforts critical care was asked to see on 10/23 given concern about possible withdrawal/worsening encephalopathy and for airway protection ability.   Past Medical History  He,  has a past medical history of DEPRESSION, DIABETES MELLITUS, TYPE I, DRUG ABUSE, Glaucoma, HEPATITIS C, Hypertension (02/18/2011), Proliferative diabetic retinopathy(362.02), and Vitiligo.   Surgical History    Past Surgical History:  Procedure Laterality Date  . ABDOMINAL AORTOGRAM W/LOWER EXTREMITY N/A 07/25/2016   Procedure: Abdominal Aortogram w/Bilateral Lower Extremity Runoff;  Surgeon: Conrad Yamhill, MD;  Location: [facility];  Service: Cardiovascular;  Laterality: N/A;  . ABDOMINAL AORTOGRAM W/LOWER EXTREMITY N/A 12/24/2016   Procedure: ABDOMINAL AORTOGRAM W/LOWER EXTREMITY;  Surgeon: Serafina Mitchell, MD;  Location: [facility];  Service: Cardiovascular;  Laterality: N/A;  . AMPUTATION TOE Right 07/26/2016   Procedure: AMPUTATION GREAT TOE;  Surgeon: Serafina Mitchell, MD;  Location: [facility];  Service: Vascular;  Laterality: Right;  . EYE SURGERY     retinal surgery x 2, right eye  . INSERTION OF ILIAC STENT Right 07/26/2016   Procedure: INSERTION OF RIGHT POPLITEAL STENT WITH BALLOON ANGIOPLASTY;  Surgeon: Serafina Mitchell, MD;  Location: [facility];  Service: Vascular;  Laterality: Right;  . LOWER EXTREMITY ANGIOGRAM Right 07/26/2016   Procedure: LOWER EXTREMITY ANGIOGRAM;  Surgeon: Serafina Mitchell, MD;  Location: [facility];  Service: Vascular;  Laterality: Right;  . PERIPHERAL VASCULAR BALLOON ANGIOPLASTY Right 07/26/2016     Procedure: PERIPHERAL VASCULAR BALLOON ANGIOPLASTY  RIGHT ANTERIOR TIBEAL ARTERY AND RIGHT SUPERFICIAL FEMORAL ARTERY;  Surgeon: Serafina Mitchell, MD;  Location: [facility];  Service: Vascular;  Laterality: Right;  . PERIPHERAL VASCULAR INTERVENTION Right 12/24/2016   Procedure: PERIPHERAL VASCULAR INTERVENTION;  Surgeon: Serafina Mitchell, MD;  Location: [facility];  Service: Cardiovascular;  Laterality: Right;  lower extr     Social History   Social History   Socioeconomic History  . Marital status: Single    Spouse name: Not on file  . Number of children: Not on file  . Years of education: Not on file  . Highest education level: Not on file  Occupational History    Employer: UNEMPLOYED    Comment: disabled  Social Needs  . Financial resource strain: Not on file  . Food insecurity:    Worry: Not on file    Inability: Not on file  . Transportation needs:    Medical: Not on file    Non-medical: Not on file  Tobacco Use  . Smoking status: Current Every Day Smoker    Packs/day: 1.00    Types: Cigarettes  . Smokeless tobacco: Never Used  Substance and Sexual Activity  . Alcohol use: No    Alcohol/week: 0.0 standard drinks    Comment: 08/14/14 none  . Drug use: Yes    Types: "Crack" cocaine, Marijuana, Heroin    Comment: s/p rehab x 6, occasional drug use (per pt last  use was 06/2015)  . Sexual activity: Not Currently  Lifestyle  . Physical activity:    Days per week: Not on file    Minutes per session: Not on file  . Stress: Not on file  Relationships  . Social connections:    Talks on phone: Not on file    Gets together: Not on file    Attends religious service: Not on file    Active member of club or organization: Not on file    Attends meetings of clubs or organizations: Not on file    Relationship status: Not on file  . Intimate partner violence:    Fear of current or ex partner: Not on file    Emotionally abused: Not on file    Physically abused: Not on file     Forced sexual activity: Not on file  Other Topics Concern  . Not on file  Social History Narrative   Lives in boarding house/homeless   Ongoing occ drug use-crack   Single, disabled, prev mental health drug counselor.   ,  reports that he has been smoking cigarettes. He has been smoking about 1.00 pack per day. He has never used smokeless tobacco. He reports that he has current or past drug history. Drugs: "Crack" cocaine, Marijuana, and Heroin. He reports that he does not drink alcohol.   Family History   His family history includes Arthritis in his mother; Diabetes in his father; Heart disease in his mother; Kidney disease in his father.   Allergies Allergies  Allergen Reactions  . Codeine Itching    Tolerates hydrocodone/apap     Home Medications  Prior to Admission medications   Medication Sig Start Date End Date Taking? Authorizing Provider  insulin NPH Human (HUMULIN N,NOVOLIN N) 100 UNIT/ML injection Inject 0.15 mLs (15 Units total) into the skin daily before breakfast. 02/01/18  Yes Lady Deutscher, MD  amLODipine (NORVASC) 10 MG tablet Take 1 tablet (10 mg total) by mouth daily. Patient not taking: Reported on 02/02/2018 02/01/18 03/03/18  Lady Deutscher, MD  aspirin EC 81 MG EC tablet Take 1 tablet (81 mg total) by mouth daily. Patient not taking: Reported on 02/02/2018 07/30/16   Lavina Hamman, MD  blood glucose meter kit and supplies KIT Dispense based on patient and insurance preference. Use up to four times daily as directed. (FOR ICD-9 250.00, 250.01).  Patient not taking: Reported on 02/03/2018 12/24/17   Elgergawy, Silver Huguenin, MD  folic acid (FOLVITE) 1 MG tablet Take 1 tablet (1 mg total) by mouth daily. Patient not taking: Reported on 02/02/2018 02/01/18 03/03/18  Lady Deutscher, MD  Multiple Vitamin (MULTIVITAMIN WITH MINERALS) TABS tablet Take 1 tablet by mouth daily. Patient not taking: Reported on 02/02/2018 02/01/18 05/02/18  Lady Deutscher, MD   nicotine (NICODERM CQ - DOSED IN MG/24 HOURS) 14 mg/24hr patch Place 1 patch (14 mg total) onto the skin daily. Patient not taking: Reported on 02/02/2018 02/01/18   Lady Deutscher, MD  thiamine 100 MG tablet Take 1 tablet (100 mg total) by mouth daily. Patient not taking: Reported on 02/02/2018 02/01/18 05/02/18  Lady Deutscher, MD     Critical care time:     Magdalen Spatz, AGACNP-BC [place] Pager # [phone] 02/06/2018 8:24 AM

## 2018-02-07 ENCOUNTER — Inpatient Hospital Stay (HOSPITAL_COMMUNITY): Payer: Medicare HMO

## 2018-02-07 LAB — GLUCOSE, CAPILLARY
GLUCOSE-CAPILLARY: 246 mg/dL — AB (ref 70–99)
GLUCOSE-CAPILLARY: 223 mg/dL — AB (ref 70–99)
Glucose-Capillary: 184 mg/dL — ABNORMAL HIGH (ref 70–99)
Glucose-Capillary: 193 mg/dL — ABNORMAL HIGH (ref 70–99)
Glucose-Capillary: 262 mg/dL — ABNORMAL HIGH (ref 70–99)
Glucose-Capillary: 340 mg/dL — ABNORMAL HIGH (ref 70–99)

## 2018-02-07 LAB — CBC
HEMATOCRIT: 35.2 % — AB (ref 39.0–52.0)
HEMOGLOBIN: 11.3 g/dL — AB (ref 13.0–17.0)
MCH: 29.1 pg (ref 26.0–34.0)
MCHC: 32.1 g/dL (ref 30.0–36.0)
MCV: 90.7 fL (ref 80.0–100.0)
PLATELETS: 272 10*3/uL (ref 150–400)
RBC: 3.88 MIL/uL — AB (ref 4.22–5.81)
RDW: 13.2 % (ref 11.5–15.5)
WBC: 14.7 10*3/uL — ABNORMAL HIGH (ref 4.0–10.5)
nRBC: 0 % (ref 0.0–0.2)

## 2018-02-07 LAB — BASIC METABOLIC PANEL
ANION GAP: 10 (ref 5–15)
BUN: 10 mg/dL (ref 8–23)
CHLORIDE: 105 mmol/L (ref 98–111)
CO2: 24 mmol/L (ref 22–32)
Calcium: 8.5 mg/dL — ABNORMAL LOW (ref 8.9–10.3)
Creatinine, Ser: 1.15 mg/dL (ref 0.61–1.24)
GFR calc Af Amer: >60 mL/min (ref >60)
Glucose, Bld: 259 mg/dL — ABNORMAL HIGH (ref 70–99)
POTASSIUM: 3 mmol/L — AB (ref 3.5–5.1)
Sodium: 139 mmol/L (ref 135–145)

## 2018-02-07 LAB — MAGNESIUM: Magnesium: 1.8 mg/dL (ref 1.7–2.4)

## 2018-02-07 LAB — PHOSPHORUS: PHOSPHORUS: 1.8 mg/dL — AB (ref 2.5–4.6)

## 2018-02-07 MED ORDER — POTASSIUM CHLORIDE CRYS ER 20 MEQ PO TBCR
40.0000 meq | EXTENDED_RELEASE_TABLET | Freq: Once | ORAL | Status: DC
  Filled 2018-02-07: qty 2

## 2018-02-07 MED ORDER — INSULIN ASPART 100 UNIT/ML ~~LOC~~ SOLN
3.0000 [IU] | Freq: Three times a day (TID) | SUBCUTANEOUS | Status: DC
  Administered 2018-02-08: 3 [IU] via SUBCUTANEOUS

## 2018-02-07 MED ORDER — METOCLOPRAMIDE HCL 5 MG/ML IJ SOLN
5.0000 mg | Freq: Four times a day (QID) | INTRAMUSCULAR | Status: DC
  Administered 2018-02-07 – 2018-02-12 (×19): 5 mg via INTRAVENOUS
  Filled 2018-02-07 (×19): qty 2

## 2018-02-07 MED ORDER — GABAPENTIN 600 MG PO TABS
300.0000 mg | ORAL_TABLET | Freq: Two times a day (BID) | ORAL | Status: DC
  Administered 2018-02-07 – 2018-02-08 (×2): 300 mg via ORAL
  Filled 2018-02-07 (×2): qty 1

## 2018-02-07 MED ORDER — INSULIN ASPART 100 UNIT/ML ~~LOC~~ SOLN
0.0000 [IU] | Freq: Every day | SUBCUTANEOUS | Status: DC
  Administered 2018-02-07 – 2018-02-15 (×2): 2 [IU] via SUBCUTANEOUS

## 2018-02-07 MED ORDER — PROMETHAZINE HCL 25 MG/ML IJ SOLN
12.5000 mg | Freq: Once | INTRAMUSCULAR | Status: AC
  Administered 2018-02-07: 12.5 mg via INTRAVENOUS
  Filled 2018-02-07: qty 1

## 2018-02-07 MED ORDER — POTASSIUM CHLORIDE 10 MEQ/100ML IV SOLN
10.0000 meq | INTRAVENOUS | Status: AC
  Administered 2018-02-07 (×3): 10 meq via INTRAVENOUS
  Filled 2018-02-07 (×3): qty 100

## 2018-02-07 MED ORDER — ONDANSETRON HCL 4 MG/2ML IJ SOLN
4.0000 mg | Freq: Four times a day (QID) | INTRAMUSCULAR | Status: DC | PRN
  Administered 2018-02-07 (×4): 4 mg via INTRAVENOUS
  Filled 2018-02-07 (×4): qty 2

## 2018-02-07 MED ORDER — POTASSIUM & SODIUM PHOSPHATES 280-160-250 MG PO PACK
2.0000 | PACK | Freq: Three times a day (TID) | ORAL | Status: DC
  Administered 2018-02-08 (×2): 2 via ORAL
  Filled 2018-02-07 (×4): qty 2

## 2018-02-07 MED ORDER — AMOXICILLIN-POT CLAVULANATE 875-125 MG PO TABS
1.0000 | ORAL_TABLET | Freq: Two times a day (BID) | ORAL | Status: DC
  Administered 2018-02-07 – 2018-02-09 (×4): 1 via ORAL
  Filled 2018-02-07 (×4): qty 1

## 2018-02-07 MED ORDER — HYDRALAZINE HCL 20 MG/ML IJ SOLN: 10.0000 mg | Freq: Four times a day (QID) | INTRAMUSCULAR | Status: DC | PRN

## 2018-02-07 MED ORDER — INSULIN GLARGINE 100 UNIT/ML ~~LOC~~ SOLN
15.0000 [IU] | Freq: Every day | SUBCUTANEOUS | Status: DC
  Administered 2018-02-08: 15 [IU] via SUBCUTANEOUS
  Filled 2018-02-07: qty 0.15

## 2018-02-07 MED ORDER — INSULIN ASPART 100 UNIT/ML ~~LOC~~ SOLN
0.0000 [IU] | Freq: Three times a day (TID) | SUBCUTANEOUS | Status: DC
  Administered 2018-02-07: 11 [IU] via SUBCUTANEOUS
  Administered 2018-02-07: 3 [IU] via SUBCUTANEOUS
  Administered 2018-02-08: 8 [IU] via SUBCUTANEOUS
  Administered 2018-02-08: 5 [IU] via SUBCUTANEOUS
  Administered 2018-02-08 – 2018-02-09 (×3): 15 [IU] via SUBCUTANEOUS
  Administered 2018-02-09: 5 [IU] via SUBCUTANEOUS

## 2018-02-07 NOTE — Progress Notes (Signed)
Patient stated he does not wish to wear NIV at this time, no distress noted.

## 2018-02-07 NOTE — Progress Notes (Addendum)
PROGRESS NOTE    [name]  VOH:[id number] DOB: 1955/10/15 DOA: 02/03/2018 PCP: [name], MD    Brief Narrative: 62 year old male patient with history of medical noncompliance, polysubstance abuse, and diabetes.  Admitted 10/22 with hyperglycemia, diabetic ketoacidosis, dehydration and renal failure after a fall at home.  Initially was alert and oriented but became more encephalopathic overnight.  Critical care asked to see on 10/23 for progressive metabolic encephalopathy.  10/22 admitted with DKA, AKI, dehydration after a fall.  Diagnostic imaging negative for acute findings on CT head or C-spine.  Was alert and oriented on admission 10/23: Anion gap closed overnight, renal function improved some, however became progressively encephalopathic overnight requiring escalating Lorazepam critical care asked to see given concern about mental status, airway protection, and critical care services.  Patient was briefly on Precedex drip and was weaned off and he was transferred to [facility] on 10/26.  Responded to questions reported that he has pain in his legs and has neuropathy.  RN reported he had 2 episodes of vomiting once last night and this morning.  Abdominal x-ray does not show any obstruction.  Chest x-ray does not show any aspiration pneumonia.    Assessment & Plan:   Principal Problem:   DKA (diabetic ketoacidoses) ([facility]) Active Problems:   COPD, mild ([place])   Acute kidney injury ([place])   Essential hypertension   Syncope   Leukocytosis   Acute metabolic encephalopathy   Demand ischemia of myocardium ([name]   Abrasions of multiple sites   Acute metabolic encephalopathy probably secondary to withdrawal symptoms from ETOH.  Weaned off precedex and transferred to [facility] .  He is abel to answer questions appropriately.    Sleep apnea: CPAP as inpatient.    AKI:  Improving with IV fluids.   NSTEMI;  Probably demand ischemia from DKA.  Cardiology signed off . No CATH .     DKA; Resolved.   Hypokalemia replaced.  Repeat in am.    Diabetes mellitus: CBG (last 3)  Recent Labs    02/07/18 0737 02/07/18 1119 02/07/18 1707  GLUCAP 262* 340* 193*   Resume SSI, change to moderate SSI,  Increase lantus to 15 units.  Add novolog 3 units TIDAC.     Aspiration pneumonitis: Was on unasyn, transition to augmentin to complete the course.     Hypertensive urgency:  Prn hydralazine.    DVT prophylaxis: heparin sq Code Status: full code.  Family Communication: none at bedside.  Disposition Plan: pending clinical improvement.    Consultants:   None.    Procedures: none.    Antimicrobials: unasyn to augmentin.      Subjective: No chest pain or sob. Pt reports pain in the lower extremities.  As per RN, he has been throwing up last night and this am.  Objective: Vitals:   02/06/18 2300 02/06/18 2340 02/07/18 0506 02/07/18 0852  BP: (!) 167/67 (!) 149/53 (!) 180/72 (!) 188/76  Pulse: (!) 115 (!) 102 (!) 105 (!) 101  Resp: (!) 21 20 20 20   Temp:   98.5 F (36.9 C) 98.7 F (37.1 C)  TempSrc:   Oral Oral  SpO2: 96% 98% 98% 99%  Weight:      Height:        Intake/Output Summary (Last 24 hours) at 02/07/2018 0902 Last data filed at 02/07/2018 6948 Gross per 24 hour  Intake 1397.84 ml  Output 3150 ml  Net -1752.16 ml   Filed Weights   02/03/18 2009 02/04/18 1317  02/05/18 0100  Weight: 71.4 kg 68.3 kg 67.6 kg    Examination:  General exam: Appears calm and comfortable  Respiratory system: Clear to auscultation. Respiratory effort normal. Cardiovascular system: S1 & S2 heard, RRR. No JVD, Gastrointestinal system: Abdomen is nondistended, soft and nontender. No organomegaly or masses felt. Normal bowel sounds heard. Central nervous system: sleeping, lethargic, able to move all extremities.  Extremities: Symmetric 5 x 5 power. No pedal edema.  Skin: No rashes, lesions or ulcers Psychiatry:  Mood & affect appropriate.      Data Reviewed: I have personally reviewed following labs and imaging studies  CBC: Recent Labs  Lab 02/02/18 1342 02/03/18 2005 02/04/18 0548 02/05/18 0322 02/07/18 0634  WBC 10.9* 26.5* 23.9* 15.5* 14.7*  NEUTROABS  --  24.4*  --   --   --   HGB 13.0 11.5* 11.1* 10.5* 11.3*  HCT 39.3 37.5* 33.3* 31.9* 35.2*  MCV 91.2 98.4 89.0 91.7 90.7  PLT 322 426* 392 305 785   Basic Metabolic Panel: Recent Labs  Lab 02/04/18 0548 02/05/18 0322 02/05/18 1148 02/06/18 0324 02/07/18 0634  NA 146* 146* 146* 145 139  K 3.4* 4.0 4.0 3.7 3.0*  CL 104 110 112* 110 105  CO2 32 32 28 27 24   GLUCOSE 227* 108* 86 125* 259*  BUN 52* 33* 25* 14 10  CREATININE 2.24* 1.45* 1.08 0.97 1.15  CALCIUM 9.0 8.7* 8.9 8.9 8.5*  MG  --   --   --  2.0  --    GFR: Estimated Creatinine Clearance: 64.5 mL/min (by C-G formula based on SCr of 1.15 mg/dL). Liver Function Tests: Recent Labs  Lab 02/03/18 2005  AST 27  ALT 23  ALKPHOS 115  BILITOT 2.0*  PROT 6.5  ALBUMIN 3.1*   No results for input(s): LIPASE, AMYLASE in the last 168 hours. No results for input(s): AMMONIA in the last 168 hours. Coagulation Profile: No results for input(s): INR, PROTIME in the last 168 hours. Cardiac Enzymes: Recent Labs  Lab 02/04/18 0913 02/04/18 1527 02/04/18 1953  TROPONINI 2.70* 2.16* 1.93*   BNP (last 3 results) No results for input(s): PROBNP in the last 8760 hours. HbA1C: No results for input(s): HGBA1C in the last 72 hours. CBG: Recent Labs  Lab 02/06/18 1526 02/06/18 1931 02/07/18 0037 02/07/18 0445 02/07/18 0737  GLUCAP 182* 151* 184* 246* 262*   Lipid Profile: No results for input(s): CHOL, HDL, LDLCALC, TRIG, CHOLHDL, LDLDIRECT in the last 72 hours. Thyroid Function Tests: No results for input(s): TSH, T4TOTAL, FREET4, T3FREE, THYROIDAB in the last 72 hours. Anemia Panel: No results for input(s): VITAMINB12, FOLATE, FERRITIN, TIBC, IRON, RETICCTPCT in the last 72 hours. Sepsis  Labs: No results for input(s): PROCALCITON, LATICACIDVEN in the last 168 hours.  Recent Results (from the past 240 hour(s))  Urine culture     Status: None   Collection Time: 01/29/18  7:42 PM  Result Value Ref Range Status   Specimen Description URINE, RANDOM  Final   Special Requests NONE  Final   Culture   Final    NO GROWTH Performed at [facility], [address], [place], [place] [address]    Report Status 01/31/2018 FINAL  Final  MRSA PCR Screening     Status: None   Collection Time: 01/30/18  7:10 AM  Result Value Ref Range Status   MRSA by PCR NEGATIVE NEGATIVE Final    Comment:        The GeneXpert MRSA Assay (  FDA approved for NASAL specimens only), is one component of a comprehensive MRSA colonization surveillance program. It is not intended to diagnose MRSA infection nor to guide or monitor treatment for MRSA infections. Performed at [facility], [place] [address], [place], [place] [address]   Culture, blood (routine x 2)     Status: None (Preliminary result)   Collection Time: 02/04/18  1:15 AM  Result Value Ref Range Status   Specimen Description BLOOD RIGHT HAND  Final   Special Requests   Final    BOTTLES DRAWN AEROBIC AND ANAEROBIC Blood Culture results may not be optimal due to an inadequate volume of blood received in culture bottles   Culture   Final    NO GROWTH 2 DAYS Performed at [facility], [place] [address], [place], [place] [address]    Report Status PENDING  Incomplete  Culture, blood (routine x 2)     Status: None (Preliminary result)   Collection Time: 02/04/18  1:27 AM  Result Value Ref Range Status   Specimen Description BLOOD LEFT HAND  Final   Special Requests   Final    BOTTLES DRAWN AEROBIC AND ANAEROBIC Blood Culture results may not be optimal due to an inadequate volume of blood received in culture bottles   Culture   Final    NO GROWTH 2 DAYS Performed at [facility], [place] [address] [place], [place]  [address]    Report Status PENDING  Incomplete  Urine Culture     Status: Abnormal   Collection Time: 02/04/18 11:05 AM  Result Value Ref Range Status   Specimen Description URINE, CLEAN CATCH  Final   Special Requests   Final    NONE Performed at [facility], [place] [address] [place], [place] [address]    Culture 10,000 COLONIES/mL STAPHYLOCOCCUS HAEMOLYTICUS (A)  Final   Report Status 02/06/2018 FINAL  Final   Organism ID, Bacteria STAPHYLOCOCCUS HAEMOLYTICUS (A)  Final      Susceptibility   Staphylococcus haemolyticus - MIC*    CIPROFLOXACIN >=8 RESISTANT Resistant     GENTAMICIN >=16 RESISTANT Resistant     NITROFURANTOIN 32 SENSITIVE Sensitive     OXACILLIN >=4 RESISTANT Resistant     TETRACYCLINE <=1 SENSITIVE Sensitive     VANCOMYCIN 1 SENSITIVE Sensitive     TRIMETH/SULFA >=320 RESISTANT Resistant     CLINDAMYCIN >=8 RESISTANT Resistant     RIFAMPIN <=0.5 SENSITIVE Sensitive     Inducible Clindamycin NEGATIVE Sensitive     * 10,000 COLONIES/mL STAPHYLOCOCCUS HAEMOLYTICUS  MRSA PCR Screening     Status: None   Collection Time: 02/04/18  1:28 PM  Result Value Ref Range Status   MRSA by PCR NEGATIVE NEGATIVE Final    Comment:        The GeneXpert MRSA Assay (FDA approved for NASAL specimens only), is one component of a comprehensive MRSA colonization surveillance program. It is not intended to diagnose MRSA infection nor to guide or monitor treatment for MRSA infections. Performed at [facility], [place] [address] [place], [place] [address]          Radiology Studies: No results found.      Scheduled Meds: . amLODipine  10 mg Oral Daily  . aspirin  81 mg Oral Daily  . folic acid  1 mg Oral Daily  . heparin  5,000 Units Subcutaneous Q8H  . Influenza vac split quadrivalent PF  0.5 mL Intramuscular Tomorrow-1000  .  insulin aspart  0-9 Units Subcutaneous Q4H  . insulin glargine  10 Units Subcutaneous Daily  . mouth rinse  15 mL Mouth Rinse BID   . multivitamin with minerals  1 tablet Oral Daily  . nicotine  14 mg Transdermal Daily  . pneumococcal 23 valent vaccine  0.5 mL Intramuscular Tomorrow-1000  . thiamine  100 mg Oral Daily   Continuous Infusions: . sodium chloride Stopped (02/06/18 0241)  . dextrose 5 % and 0.45% NaCl 100 mL/hr at 02/07/18 0225     LOS: 4 days    Time spent: 17 min    Hosie Poisson, MD [name] Pager [phone]  If 7PM-7AM, please contact night-coverage [email or web address] Password [email or web address] 02/07/2018, 9:02 AM

## 2018-02-07 NOTE — Progress Notes (Signed)
Patient took 10am meds but had coffee ground emesis soon there after. Zofran given
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
## 2018-02-08 LAB — BASIC METABOLIC PANEL
ANION GAP: 14 (ref 5–15)
BUN: 25 mg/dL — ABNORMAL HIGH (ref 8–23)
CHLORIDE: 101 mmol/L (ref 98–111)
CO2: 23 mmol/L (ref 22–32)
Calcium: 8.2 mg/dL — ABNORMAL LOW (ref 8.9–10.3)
Creatinine, Ser: 1.57 mg/dL — ABNORMAL HIGH (ref 0.61–1.24)
GFR, EST AFRICAN AMERICAN: 53 mL/min — AB (ref >60)
GFR, EST NON AFRICAN AMERICAN: 46 mL/min — AB (ref >60)
Glucose, Bld: 228 mg/dL — ABNORMAL HIGH (ref 70–99)
POTASSIUM: 4 mmol/L (ref 3.5–5.1)
SODIUM: 138 mmol/L (ref 135–145)

## 2018-02-08 LAB — GLUCOSE, CAPILLARY
GLUCOSE-CAPILLARY: 390 mg/dL — AB (ref 70–99)
GLUCOSE-CAPILLARY: 263 mg/dL — AB (ref 70–99)
GLUCOSE-CAPILLARY: 227 mg/dL — AB (ref 70–99)

## 2018-02-08 MED ORDER — GABAPENTIN 600 MG PO TABS
300.0000 mg | ORAL_TABLET | Freq: Three times a day (TID) | ORAL | Status: DC
  Administered 2018-02-08 – 2018-02-16 (×23): 300 mg via ORAL
  Filled 2018-02-08 (×23): qty 1

## 2018-02-08 MED ORDER — INSULIN GLARGINE 100 UNIT/ML ~~LOC~~ SOLN
20.0000 [IU] | Freq: Every day | SUBCUTANEOUS | Status: DC
  Administered 2018-02-09: 20 [IU] via SUBCUTANEOUS
  Filled 2018-02-08: qty 0.2

## 2018-02-08 MED ORDER — INSULIN ASPART 100 UNIT/ML ~~LOC~~ SOLN
5.0000 [IU] | Freq: Three times a day (TID) | SUBCUTANEOUS | Status: DC
  Administered 2018-02-08 – 2018-02-10 (×5): 5 [IU] via SUBCUTANEOUS

## 2018-02-08 MED ORDER — ACETAMINOPHEN 325 MG PO TABS
650.0000 mg | ORAL_TABLET | ORAL | Status: DC | PRN
  Administered 2018-02-08 – 2018-02-11 (×2): 650 mg via ORAL
  Filled 2018-02-08 (×2): qty 2

## 2018-02-08 MED ORDER — SODIUM PHOSPHATES 45 MMOLE/15ML IV SOLN: 30.0000 mmol | Freq: Once | INTRAVENOUS | Status: DC

## 2018-02-08 NOTE — Plan of Care (Signed)
Patient stable, swallowing difficulty noted, MD notified, ST consult ordered, dysphagia I diet recommended at this time. Discussed POC with patient, agreeable with plan, denies question/concerns. PRN pain and anxiety rx providing adequate relief.

## 2018-02-08 NOTE — Progress Notes (Signed)
Pt was seen eating food from his bag (canned baked beans) pt was advised and educated not to do that as he is on a purred diet due to fear of aspiration, pt was made aware of the implication of choking, will however continue to monitor. Obasogie-Asidi, Apoorva Bugay Efe

## 2018-02-08 NOTE — Progress Notes (Addendum)
PROGRESS NOTE    [name]  CHE:[id number] DOB: 01-10-1956 DOA: 02/03/2018 PCP: [name], MD    Brief Narrative: 62 year old male patient with history of medical noncompliance, polysubstance abuse, and diabetes.  Admitted 10/22 with hyperglycemia, diabetic ketoacidosis, dehydration and renal failure after a fall at home.  Initially was alert and oriented but became more encephalopathic overnight.  Critical care asked to see on 10/23 for progressive metabolic encephalopathy.  10/22 admitted with DKA, AKI, dehydration after a fall.  Diagnostic imaging negative for acute findings on CT head or C-spine.  Was alert and oriented on admission 10/23: Anion gap closed overnight, renal function improved some, however became progressively encephalopathic overnight requiring escalating Lorazepam critical care asked to see given concern about mental status, airway protection, and critical care services.  Patient was briefly on Precedex drip and was weaned off and he was transferred to [facility] on 10/26.  10/26 abdominal x-ray does not show any obstruction.  Chest x-ray does not show any aspiration pneumonia. 10/27 patient seen and examined at bedside.  Patient reports he continues to have neuropathy pain in his legs but better than yesterday.  He denies any nausea or vomiting at this time.    Assessment & Plan:   Principal Problem:   DKA (diabetic ketoacidoses) ([place]) Active Problems:   COPD, mild ([place])   Acute kidney injury ([place])   Essential hypertension   Syncope   Leukocytosis   Acute metabolic encephalopathy   Demand ischemia of myocardium ([name]   Abrasions of multiple sites   Acute metabolic encephalopathy probably secondary to withdrawal symptoms from ETOH.  Weaned off precedex and transferred to [facility] .  He is able to answer questions appropriately.  Patient does not have any withdrawal symptoms at this time.  Continue to monitor on the same medications.   Sleep apnea: CPAP as  inpatient.    AKI:  Improving with IV fluids.  Creatinine appears to be at baseline at this time  NSTEMI;  Probably demand ischemia from DKA.  Cardiology signed off . No CATH .    DKA; Resolved.   Hypokalemia replaced.  Repeat in am.    Hypophosphatemia:  Replaced. Repeat in am.   Diabetes mellitus: CBG (last 3)  Recent Labs    02/07/18 1707 02/07/18 2113 02/08/18 0613  GLUCAP 193* 223* 390*   Resume SSI, change to moderate SSI,  Increase lantus to 20 units.  Increase the  novolog from 3 to 5  units TIDAC.     Aspiration pneumonitis: Was on unasyn, transition to augmentin to complete the course.     Hypertensive urgency:  Prn hydralazine.    Anemia of chronic disease:  Hemoglobin around 11.  Recheck in am.   Leukocytosis: Suspect from aspiration pneumonia.    Fever Suspect ongoing aspiration get blood cultures continue with Augmentin get chest x-ray and urinalysis.    DVT prophylaxis: heparin sq Code Status: full code.  Family Communication: none at bedside.  Disposition Plan: pending clinical improvement.    Consultants:   None.    Procedures: none.    Antimicrobials: unasyn to augmentin.      Subjective: Patient reports pain in his foot is regular neuropathic pain increased his gabapentin to 300 mg 3 times daily RN reports some difficulty with swallowing speech evaluation requested recommended patient be on dysphagia 1 diet at this time.   Objective: Vitals:   02/08/18 0843 02/08/18 0847 02/08/18 1120 02/08/18 1526  BP: (!) 127/52 (!) 127/52 (!) 144/56 [name])  125/53  Pulse: 83 82 83 85  Resp: 16 16 20 20   Temp: 98.1 F (36.7 C) 98.1 F (36.7 C) 98.3 F (36.8 C) (!) 100.4 F (38 C)  TempSrc: Oral Oral Oral Oral  SpO2:  92% 90% 94%  Weight:      Height:        Intake/Output Summary (Last 24 hours) at 02/08/2018 1635 Last data filed at 02/08/2018 0846 Gross per 24 hour  Intake -  Output 1204 ml  Net -1204 ml   Filed  Weights   02/03/18 2009 02/04/18 1317 02/05/18 0100  Weight: 71.4 kg 68.3 kg 67.6 kg    Examination:  General exam: Appears calm and comfortable in any kind of distress Respiratory system: Good air entry bilateral no wheezing or rhonchi Cardiovascular system: S1 & S2 heard, RRR. No JVD, Gastrointestinal system: Abdomen is soft, nontender, nondistended with good bowel sounds Central nervous system: Alert and following simple commands. Extremities: Symmetric 5 x 5 power. No pedal edema.  Skin: No rashes, lesions or ulcers Psychiatry:  Mood & affect appropriate.     Data Reviewed: I have personally reviewed following labs and imaging studies  CBC: Recent Labs  Lab 02/02/18 1342 02/03/18 2005 02/04/18 0548 02/05/18 0322 02/07/18 0634  WBC 10.9* 26.5* 23.9* 15.5* 14.7*  NEUTROABS  --  24.4*  --   --   --   HGB 13.0 11.5* 11.1* 10.5* 11.3*  HCT 39.3 37.5* 33.3* 31.9* 35.2*  MCV 91.2 98.4 89.0 91.7 90.7  PLT 322 426* 392 305 696   Basic Metabolic Panel: Recent Labs  Lab 02/04/18 0548 02/05/18 0322 02/05/18 1148 02/06/18 0324 02/07/18 0634 02/07/18 1221  NA 146* 146* 146* 145 139  --   K 3.4* 4.0 4.0 3.7 3.0*  --   CL 104 110 112* 110 105  --   CO2 32 32 28 27 24   --   GLUCOSE 227* 108* 86 125* 259*  --   BUN 52* 33* 25* 14 10  --   CREATININE 2.24* 1.45* 1.08 0.97 1.15  --   CALCIUM 9.0 8.7* 8.9 8.9 8.5*  --   MG  --   --   --  2.0  --  1.8  PHOS  --   --   --   --   --  1.8*   GFR: Estimated Creatinine Clearance: 64.5 mL/min (by C-G formula based on SCr of 1.15 mg/dL). Liver Function Tests: Recent Labs  Lab 02/03/18 2005  AST 27  ALT 23  ALKPHOS 115  BILITOT 2.0*  PROT 6.5  ALBUMIN 3.1*   No results for input(s): LIPASE, AMYLASE in the last 168 hours. No results for input(s): AMMONIA in the last 168 hours. Coagulation Profile: No results for input(s): INR, PROTIME in the last 168 hours. Cardiac Enzymes: Recent Labs  Lab 02/04/18 0913 02/04/18 1527  02/04/18 1953  TROPONINI 2.70* 2.16* 1.93*   BNP (last 3 results) No results for input(s): PROBNP in the last 8760 hours. HbA1C: No results for input(s): HGBA1C in the last 72 hours. CBG: Recent Labs  Lab 02/07/18 0737 02/07/18 1119 02/07/18 1707 02/07/18 2113 02/08/18 0613  GLUCAP 262* 340* 193* 223* 390*   Lipid Profile: No results for input(s): CHOL, HDL, LDLCALC, TRIG, CHOLHDL, LDLDIRECT in the last 72 hours. Thyroid Function Tests: No results for input(s): TSH, T4TOTAL, FREET4, T3FREE, THYROIDAB in the last 72 hours. Anemia Panel: No results for input(s): VITAMINB12, FOLATE, FERRITIN, TIBC, IRON, RETICCTPCT in the last 72  hours. Sepsis Labs: No results for input(s): PROCALCITON, LATICACIDVEN in the last 168 hours.  Recent Results (from the past 240 hour(s))  Urine culture     Status: None   Collection Time: 01/29/18  7:42 PM  Result Value Ref Range Status   Specimen Description URINE, RANDOM  Final   Special Requests NONE  Final   Culture   Final    NO GROWTH Performed at [facility], [address], [place], [place] [address]    Report Status 01/31/2018 FINAL  Final  MRSA PCR Screening     Status: None   Collection Time: 01/30/18  7:10 AM  Result Value Ref Range Status   MRSA by PCR NEGATIVE NEGATIVE Final    Comment:        The GeneXpert MRSA Assay (FDA approved for NASAL specimens only), is one component of a comprehensive MRSA colonization surveillance program. It is not intended to diagnose MRSA infection nor to guide or monitor treatment for MRSA infections. Performed at [facility], [place] [address] [place], [place] [address]   Culture, blood (routine x 2)     Status: None (Preliminary result)   Collection Time: 02/04/18  1:15 AM  Result Value Ref Range Status   Specimen Description BLOOD RIGHT HAND  Final   Special Requests   Final    BOTTLES DRAWN AEROBIC AND ANAEROBIC Blood Culture results may not be optimal due to an inadequate  volume of blood received in culture bottles   Culture   Final    NO GROWTH 4 DAYS Performed at [facility], [place] [address], [place], [place] [address]    Report Status PENDING  Incomplete  Culture, blood (routine x 2)     Status: None (Preliminary result)   Collection Time: 02/04/18  1:27 AM  Result Value Ref Range Status   Specimen Description BLOOD LEFT HAND  Final   Special Requests   Final    BOTTLES DRAWN AEROBIC AND ANAEROBIC Blood Culture results may not be optimal due to an inadequate volume of blood received in culture bottles   Culture   Final    NO GROWTH 4 DAYS Performed at [facility], [place] [address] [place] [address], [place], [place] [address]    Report Status PENDING  Incomplete  Urine Culture     Status: Abnormal   Collection Time: 02/04/18 11:05 AM  Result Value Ref Range Status   Specimen Description URINE, CLEAN CATCH  Final   Special Requests   Final    NONE Performed at [facility], [place] [address] [place], [place] [address]    Culture 10,000 COLONIES/mL STAPHYLOCOCCUS HAEMOLYTICUS (A)  Final   Report Status 02/06/2018 FINAL  Final   Organism ID, Bacteria STAPHYLOCOCCUS HAEMOLYTICUS (A)  Final      Susceptibility   Staphylococcus haemolyticus - MIC*    CIPROFLOXACIN >=8 RESISTANT Resistant     GENTAMICIN >=16 RESISTANT Resistant     NITROFURANTOIN 32 SENSITIVE Sensitive     OXACILLIN >=4 RESISTANT Resistant     TETRACYCLINE <=1 SENSITIVE Sensitive     VANCOMYCIN 1 SENSITIVE Sensitive     TRIMETH/SULFA >=320 RESISTANT Resistant     CLINDAMYCIN >=8 RESISTANT Resistant     RIFAMPIN <=0.5 SENSITIVE Sensitive     Inducible Clindamycin NEGATIVE Sensitive     * 10,000 COLONIES/mL STAPHYLOCOCCUS HAEMOLYTICUS  MRSA PCR Screening     Status: None   Collection Time: 02/04/18  1:28 PM  Result  Value Ref Range Status   MRSA by PCR NEGATIVE NEGATIVE Final    Comment:        The GeneXpert MRSA Assay (FDA approved for NASAL specimens only), is one component of  a comprehensive MRSA colonization surveillance program. It is not intended to diagnose MRSA infection nor to guide or monitor treatment for MRSA infections. Performed at [facility], [place] [address], [place], [place] [address]          Radiology Studies: Dg Chest Port 1 View  Result Date: 02/07/2018 CLINICAL DATA:  62 year old male with nausea vomiting. Recent fall EXAM: PORTABLE CHEST 1 VIEW COMPARISON:  02/04/2018 and earlier. FINDINGS: Portable AP upright view at 0716 hours. Lung volumes appear normal. Allowing for portable technique the lungs are clear. Normal cardiac size and mediastinal contours. Visualized tracheal air column is within normal limits. No pneumothorax. Chronic left lateral 4th and 8th rib fractures are chronic and stable. No acute osseous abnormality identified. IMPRESSION: 1. No cardiopulmonary abnormality. 2. Chronic left rib fractures. Electronically Signed   By: Genevie Ann M.D.   On: 02/07/2018 09:48   Dg Abd Portable 1v  Result Date: 02/07/2018 CLINICAL DATA:  62 year old male with nausea vomiting.  Recent fall EXAM: PORTABLE ABDOMEN - 1 VIEW COMPARISON:  CT Abdomen and Pelvis 02/02/2018 FINDINGS: Portable AP supine view at 0716 hours. Non obstructed bowel gas pattern. Negative visible lung bases. Iliac artery calcified atherosclerosis. No acute osseous abnormality identified. IMPRESSION: Negative. Electronically Signed   By: Genevie Ann M.D.   On: 02/07/2018 09:46        Scheduled Meds: . amLODipine  10 mg Oral Daily  . amoxicillin-clavulanate  1 tablet Oral Q12H  . aspirin  81 mg Oral Daily  . folic acid  1 mg Oral Daily  . gabapentin  300 mg Oral BID  . heparin  5,000 Units Subcutaneous Q8H  . Influenza vac split quadrivalent PF  0.5 mL Intramuscular Tomorrow-1000  . insulin aspart  0-15 Units Subcutaneous TID WC  . insulin aspart  0-5 Units Subcutaneous QHS  . insulin aspart  3 Units Subcutaneous TID WC  . insulin glargine  15 Units Subcutaneous  Daily  . mouth rinse  15 mL Mouth Rinse BID  . metoCLOPramide (REGLAN) injection  5 mg Intravenous Q6H  . multivitamin with minerals  1 tablet Oral Daily  . nicotine  14 mg Transdermal Daily  . pneumococcal 23 valent vaccine  0.5 mL Intramuscular Tomorrow-1000  . potassium chloride  40 mEq Oral Once  . thiamine  100 mg Oral Daily   Continuous Infusions: . sodium chloride 10 mL/hr at 02/07/18 1500  . sodium phosphate  Dextrose 5% IVPB       LOS: 5 days    Time spent: 35 min    Hosie Poisson, MD [name] Pager [phone]  If 7PM-7AM, please contact night-coverage [email or web address] Password [facility] 02/08/2018, 4:35 PM

## 2018-02-08 NOTE — Evaluation (Signed)
Clinical/Bedside Swallow Evaluation Patient Details  Name: [name] MRN: [id number] Date of Birth: 11/28/1955  Today's Date: 02/08/2018 Time: SLP Start Time (ACUTE ONLY): 5366 SLP Stop Time (ACUTE ONLY): 1514 SLP Time Calculation (min) (ACUTE ONLY): 29 min  Past Medical History:  Past Medical History:  Diagnosis Date  . DEPRESSION   . DIABETES MELLITUS, TYPE I   . DRUG ABUSE    pt should have NO controlled substances rx'ed  . Glaucoma   . HEPATITIS C    chronic  . Hypertension 02/18/2011  . 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 Vitiligo    Past Surgical History:  Past Surgical History:  Procedure Laterality Date  . ABDOMINAL AORTOGRAM W/LOWER EXTREMITY N/A 07/25/2016   Procedure: Abdominal Aortogram w/Bilateral Lower Extremity Runoff;  Surgeon: Conrad Zebulon, MD;  Location: [facility];  Service: Cardiovascular;  Laterality: N/A;  . ABDOMINAL AORTOGRAM W/LOWER EXTREMITY N/A 12/24/2016   Procedure: ABDOMINAL AORTOGRAM W/LOWER EXTREMITY;  Surgeon: Serafina Mitchell, MD;  Location: [facility];  Service: Cardiovascular;  Laterality: N/A;  . AMPUTATION TOE Right 07/26/2016   Procedure: AMPUTATION GREAT TOE;  Surgeon: Serafina Mitchell, MD;  Location: [facility];  Service: Vascular;  Laterality: Right;  . EYE SURGERY     retinal surgery x 2, right eye  . INSERTION OF ILIAC STENT Right 07/26/2016   Procedure: INSERTION OF RIGHT POPLITEAL STENT WITH BALLOON ANGIOPLASTY;  Surgeon: Serafina Mitchell, MD;  Location: [facility];  Service: Vascular;  Laterality: Right;  . LOWER EXTREMITY ANGIOGRAM Right 07/26/2016   Procedure: LOWER EXTREMITY ANGIOGRAM;  Surgeon: Serafina Mitchell, MD;  Location: [facility];  Service: Vascular;  Laterality: Right;  . PERIPHERAL VASCULAR BALLOON ANGIOPLASTY Right 07/26/2016   Procedure: PERIPHERAL VASCULAR BALLOON ANGIOPLASTY  RIGHT ANTERIOR TIBEAL ARTERY AND RIGHT SUPERFICIAL FEMORAL ARTERY;  Surgeon: Serafina Mitchell, MD;  Location: [facility];  Service: Vascular;   Laterality: Right;  . PERIPHERAL VASCULAR INTERVENTION Right 12/24/2016   Procedure: PERIPHERAL VASCULAR INTERVENTION;  Surgeon: Serafina Mitchell, MD;  Location: [facility];  Service: Cardiovascular;  Laterality: Right;  lower extr   HPI:  Pt is a 62 y/o male with history of medical noncompliance, polysubstance abuse, and diabetes.  Admitted 10/22 with hyperglycemia, diabetic ketoacidosis, dehydration and renal failure after a fall at home.  Initially was alert and oriented but became more encephalopathic overnight. Has had some episodes of vomiting.   Assessment / Plan / Recommendation Clinical Impression   Patient presents with increased risk for aspiration due to altered mentation and suspected primary esophageal dysphagia. When SLP entered pt's room, he was found to be resting, holding food in his left buccal cavity, with liquids and solids spilled throughout his bed. SLP used toothette to extract solids from pt's mouth and to clean his tongue, which had thick orange coating. Pt able to remain alert, conversing with SLP, though he is highly perseverative and somewhat confused. He is adamant about self-feeding, though assistance required to hold cup and to avoid spilling. Swallow appears timely for thin liquids and purees; vocal quality is initially clear, though pt has belching and delayed coughing with liquids. Suspect primary esophageal vs oropharyngeal dysphagia. He reports "pills are coming back up" unless they are crushed. Recommend dys 1, thin liquids, meds crushed in puree given current mentation and pt's reports of nausea, regurgitation. Will follow for tolerance/advancement and to determine need for instrumental testing vs esophageal w/u should symptoms persist. D/w RN.  SLP Visit Diagnosis: Dysphagia, unspecified (R13.10)    Aspiration Risk  Moderate aspiration risk    Diet Recommendation Dysphagia 1 (Puree);Thin liquid   Liquid Administration via: Cup;Straw Medication  Administration: Crushed with puree Supervision: Patient able to self feed;Full supervision to cue for compensatory strategies Compensations: Minimize environmental distractions;Slow rate;Small sips/bites;Follow solids with liquid Postural Changes: Seated upright at 90 degrees;Remain upright for at least 30 minutes after po intake    Other  Recommendations Recommended Consults: Consider GI evaluation Oral Care Recommendations: Oral care BID   Follow up Recommendations Skilled Nursing facility      Frequency and Duration min 2x/week  2 weeks       Prognosis Prognosis for Safe Diet Advancement: Fair Barriers to Reach Goals: Other (Comment)(comorbidities; suspect primary esophageal dysphagia)      Swallow Study   General Date of Onset: 02/03/18 HPI: Pt is a 62 y/o male with history of medical noncompliance, polysubstance abuse, and diabetes.  Admitted 10/22 with hyperglycemia, diabetic ketoacidosis, dehydration and renal failure after a fall at home.  Initially was alert and oriented but became more encephalopathic overnight. Has had some episodes of vomiting. Type of Study: Bedside Swallow Evaluation Previous Swallow Assessment: none in chart Diet Prior to this Study: Regular;Thin liquids Temperature Spikes Noted: No Respiratory Status: Room air History of Recent Intubation: No Behavior/Cognition: Alert;Confused;Other (Comment);Requires cueing(perseverative) Oral Cavity Assessment: Other (comment);Dry;Dried secretions(food residue pocketed in L buccal cavity) Oral Care Completed by SLP: Yes Oral Cavity - Dentition: Poor condition;Missing dentition Vision: Functional for self-feeding Self-Feeding Abilities: Needs assist Patient Positioning: Upright in bed Baseline Vocal Quality: Normal Volitional Cough: Strong;Wet Volitional Swallow: Able to elicit    Oral/Motor/Sensory Function Overall Oral Motor/Sensory Function: Generalized oral weakness   Ice Chips Ice chips: Not tested    Thin Liquid Thin Liquid: Impaired Pharyngeal  Phase Impairments: Cough - Delayed    Nectar Thick Nectar Thick Liquid: Not tested   Honey Thick Honey Thick Liquid: Not tested   Puree Puree: Within functional limits Presentation: Self Fed;Spoon   Solid     Solid: Impaired Presentation: Self Fed;Spoon Oral Phase Impairments: Impaired mastication;Poor awareness of bolus Oral Phase Functional Implications: Left lateral sulci pocketing;Right anterior spillage;Left anterior spillage;Oral residue;Impaired mastication     Deneise Lever, [place], CCC-SLP Speech-Language Pathologist Acute Rehabilitation Services Pager: [phone] Office: [phone]  Aliene Altes 02/08/2018,3:28 PM

## 2018-02-09 ENCOUNTER — Other Ambulatory Visit: Payer: Self-pay

## 2018-02-09 ENCOUNTER — Inpatient Hospital Stay (HOSPITAL_COMMUNITY): Payer: Medicare HMO

## 2018-02-09 LAB — CBC
HCT: 30.2 % — ABNORMAL LOW (ref 39.0–52.0)
Hemoglobin: 9.9 g/dL — ABNORMAL LOW (ref 13.0–17.0)
MCH: 30.2 pg (ref 26.0–34.0)
MCHC: 32.8 g/dL (ref 30.0–36.0)
MCV: 92.1 fL (ref 80.0–100.0)
NRBC: 0 % (ref 0.0–0.2)
PLATELETS: 256 10*3/uL (ref 150–400)
RBC: 3.28 MIL/uL — ABNORMAL LOW (ref 4.22–5.81)
RDW: 13.2 % (ref 11.5–15.5)
WBC: 11.5 10*3/uL — AB (ref 4.0–10.5)

## 2018-02-09 LAB — GLUCOSE, CAPILLARY
GLUCOSE-CAPILLARY: 143 mg/dL — AB (ref 70–99)
GLUCOSE-CAPILLARY: 373 mg/dL — AB (ref 70–99)
GLUCOSE-CAPILLARY: 372 mg/dL — AB (ref 70–99)
GLUCOSE-CAPILLARY: 134 mg/dL — AB (ref 70–99)
Glucose-Capillary: 237 mg/dL — ABNORMAL HIGH (ref 70–99)
Glucose-Capillary: 131 mg/dL — ABNORMAL HIGH (ref 70–99)

## 2018-02-09 LAB — CULTURE, BLOOD (ROUTINE X 2)
Culture: NO GROWTH
Culture: NO GROWTH

## 2018-02-09 LAB — BASIC METABOLIC PANEL
Anion gap: 7 (ref 5–15)
BUN: 26 mg/dL — AB (ref 8–23)
CHLORIDE: 102 mmol/L (ref 98–111)
CO2: 27 mmol/L (ref 22–32)
CREATININE: 1.44 mg/dL — AB (ref 0.61–1.24)
Calcium: 8 mg/dL — ABNORMAL LOW (ref 8.9–10.3)
GFR calc Af Amer: 59 mL/min — ABNORMAL LOW (ref >60)
GFR calc non Af Amer: 51 mL/min — ABNORMAL LOW (ref >60)
Glucose, Bld: 348 mg/dL — ABNORMAL HIGH (ref 70–99)
Potassium: 3.9 mmol/L (ref 3.5–5.1)
SODIUM: 136 mmol/L (ref 135–145)

## 2018-02-09 LAB — PHOSPHORUS: PHOSPHORUS: 2.5 mg/dL (ref 2.5–4.6)

## 2018-02-09 MED ORDER — INSULIN GLARGINE 100 UNIT/ML ~~LOC~~ SOLN
28.0000 [IU] | Freq: Every day | SUBCUTANEOUS | Status: DC
  Administered 2018-02-10: 28 [IU] via SUBCUTANEOUS
  Filled 2018-02-09: qty 0.28

## 2018-02-09 MED ORDER — SODIUM CHLORIDE 0.9 % IV SOLN
3.0000 g | Freq: Three times a day (TID) | INTRAVENOUS | Status: DC
  Administered 2018-02-09 – 2018-02-12 (×9): 3 g via INTRAVENOUS
  Filled 2018-02-09 (×11): qty 3

## 2018-02-09 MED ORDER — INSULIN ASPART 100 UNIT/ML ~~LOC~~ SOLN
0.0000 [IU] | Freq: Three times a day (TID) | SUBCUTANEOUS | Status: DC
  Administered 2018-02-10: 7 [IU] via SUBCUTANEOUS
  Administered 2018-02-11: 2 [IU] via SUBCUTANEOUS
  Administered 2018-02-11: 7 [IU] via SUBCUTANEOUS
  Administered 2018-02-11: 3 [IU] via SUBCUTANEOUS
  Administered 2018-02-12: 15 [IU] via SUBCUTANEOUS
  Administered 2018-02-12 – 2018-02-13 (×3): 4 [IU] via SUBCUTANEOUS
  Administered 2018-02-13 (×2): 3 [IU] via SUBCUTANEOUS
  Administered 2018-02-14: 20 [IU] via SUBCUTANEOUS
  Administered 2018-02-14: 7 [IU] via SUBCUTANEOUS
  Administered 2018-02-15: 4 [IU] via SUBCUTANEOUS
  Administered 2018-02-15: 15 [IU] via SUBCUTANEOUS
  Administered 2018-02-16: 20 [IU] via SUBCUTANEOUS
  Administered 2018-02-16: 1 [IU] via SUBCUTANEOUS

## 2018-02-09 MED ORDER — RESOURCE THICKENUP CLEAR PO POWD
ORAL | Status: DC | PRN
  Filled 2018-02-09 (×2): qty 125

## 2018-02-09 NOTE — Progress Notes (Signed)
  Speech Language Pathology Treatment: Dysphagia  Patient Details Name: [name] MRN: [id number] DOB: November 02, 1955 Today's Date: 02/09/2018 Time: 1601-0932 SLP Time Calculation (min) (ACUTE ONLY): 10 min  Assessment / Plan / Recommendation Clinical Impression  Pt alert sitting upright in bed. RN reports taking away his food tray due to aspiration risk.  Pt is unhappy with his current diet stating he needs "real food".    He denies issues with swallowing stating "I've been coughing since I've been 62 years old" and attributes cough to COPD.  Observed him taking medicine crushed and Sprite from RN.  Inconsistent coughing noted = most notably after thin liquid swallow= with poor pt awareness.  In addition, wet voice noted with poor pt awareness.  Pt reports he has not had prior swallow evaluations in xray or endoscopy.    Pt denies reflux issues but occasionally takes Tums.  Pt not observed with coughing without po intake - thus aspiration concerns are present.  Given pt's deconditioning, COPD - can not rule out aspiration clinically.  Will proceed with MBS -pt agreeable to plan.  Pt educated and agreeable to plan.  RN informed and order placed.     HPI HPI: Pt is a 62 y/o male with history of medical noncompliance, polysubstance abuse, and diabetes.  Admitted 10/22 with hyperglycemia, diabetic ketoacidosis, dehydration and renal failure after a fall at home.  Initially was alert and oriented but became more encephalopathic overnight. Has had some episodes of vomiting.  Pt underwent BSE yesterday and was placed on dys1/thin diet.  Heard pt coughing from outside the room and RN reports he took pt's meal away due to aspiration concerns.  Follow up to assess tolerance and indication for instrumental evaluation.        SLP Plan  New goals to be determined pending instrumental study       Recommendations  Diet recommendations: Dysphagia 1 (puree);Thin liquid(pending mbs) Liquids provided via:  Cup;Straw Medication Administration: Crushed with puree Supervision: Patient able to self feed Compensations: Minimize environmental distractions;Slow rate;Small sips/bites;Follow solids with liquid Postural Changes and/or Swallow Maneuvers: Seated upright 90 degrees;Upright 30-60 min after meal                Oral Care Recommendations: Oral care BID Follow up Recommendations: Skilled Nursing facility SLP Visit Diagnosis: Dysphagia, unspecified (R13.10) Plan: New goals to be determined pending instrumental study       GO                [name] 02/09/2018, 9:56 AM   Luanna Salk, MS [facility] SLP Acute Rehab Services Pager [phone] Office [phone]

## 2018-02-09 NOTE — Progress Notes (Signed)
Modified Barium Swallow Progress Note  Patient Details  Name: DMITRI PETTIGREW MRN: [id number] Date of Birth: 05-18-1955  Today's Date: 02/09/2018  Modified Barium Swallow completed.  Full report located under Chart Review in the Imaging Section.  Brief recommendations include the following:  Clinical Impression  Patient presents with moderate sensorimotor oropharyngeal dysphagia - Oral control impaired with liquids mostly resulting in premature spillage of oral residuals to pyriform sinus with liquids.  Thin liquid pooling in pyriform sinus then spills into open airway with next swallow resulting in silent aspiration.  Dry swallows on command were difficult for pt to perform. Head turn left *pt's reported weak side* did not improve airway protection or pharyngeal clearance.  Recommend pt solids be advanced to dys3/ground meats and modify to nectar thick liquids with free water between meals.  Pt will benefit from aggressive SlP to improve oral and pharyngeal strenghening as he states he is motivated.  Using live images/flouro loops educated pt to findings and reinforced effective strategies.     Swallow Evaluation Recommendations       SLP Diet Recommendations: Dysphagia 3 (Mech soft) solids;Nectar thick liquid;Other (Comment);Free water protocol after oral care   Liquid Administration via: Cup;No straw   Medication Administration: Whole meds with puree   Supervision: Patient able to self feed;Full supervision/cueing for compensatory strategies   Compensations: Slow rate;Small sips/bites;Follow solids with liquid;Effortful swallow   Postural Changes: Remain semi-upright after after feeds/meals (Comment);Seated upright at 90 degrees   Oral Care Recommendations: Oral care BID      Luanna Salk, MS [place] Pager [phone] Office [phone]   [name] 02/09/2018,11:03 AM

## 2018-02-09 NOTE — Care Management Important Message (Signed)
Important Message  Patient Details  Name: [name] MRN: [id number] Date of Birth: 07-18-1955   Medicare Important Message Given:  Yes    [place] Cookson [address] 02/09/2018, 3:25 PM

## 2018-02-09 NOTE — Progress Notes (Signed)
Physical Therapy Treatment Patient Details Name: [name] MRN: [id number] DOB: 1955/09/11 Today's Date: 02/09/2018    History of Present Illness Pt is a 62 y/o male with history of medical noncompliance, polysubstance abuse, and diabetes.  Admitted 10/22 with hyperglycemia, diabetic ketoacidosis, dehydration and renal failure after a fall at home.  Initially was alert and oriented but became more encephalopathic overnight. Troponins were elevated and Cardiology was consulted and contributed that to demand ischemia.     PT Comments    Pt self limiting throughout session, perseverating on falling and being able to walk again.  Requires cues to redirect and stay attended to task.  Pt noted to be incontinent of stool on arrival, caked to bottom and pt reported not being aware of incontinence.  Rolling L/R with supervision with bed rails.  Mod to come to sitting to EOB and mod for squat/pivot to recliner on R.  Pt would benefit from continued PT while in acute setting to maximize independence and progress functional mobility, as well as f/u with sub-acute rehab to return to PLOF.      Follow Up Recommendations  SNF;Supervision/Assistance - 24 hour     Equipment Recommendations  None recommended by PT    Recommendations for Other Services       Precautions / Restrictions Precautions Precautions: Fall Precaution Comments: impaired postural control in sitting, hands on for sitting balance Restrictions Weight Bearing Restrictions: No    Mobility  Bed Mobility Overal bed mobility: Needs Assistance Bed Mobility: Supine to Sit Rolling: Supervision   Supine to sit: Mod assist     General bed mobility comments: supervision to roll with use of bed rails, mod assist to come to sitting EOB with mod multimodal cues for sequencing and use of rails for assist rather than pulling on therapist  Transfers Overall transfer level: Needs assistance   Transfers: Squat Pivot Transfers      Squat pivot transfers: Mod assist     General transfer comment: pt requires cues for sequencing and hand placement, very self limiting, could likely be more independent with effort  Ambulation/Gait                 Stairs             Wheelchair Mobility    Modified Rankin (Stroke Patients Only)       Balance Overall balance assessment: Needs assistance Sitting-balance support: Bilateral upper extremity supported;Feet supported Sitting balance-Leahy Scale: Poor Sitting balance - Comments: Pt with poor trunk control sitting EOB, requires BUE support and min assist to maintain static sitting balance EOB                                    Cognition Arousal/Alertness: Awake/alert Behavior During Therapy: Restless;Anxious Overall Cognitive Status: Difficult to assess Area of Impairment: Attention;Following commands;Safety/judgement;Awareness                   Current Attention Level: Focused   Following Commands: Follows one step commands inconsistently Safety/Judgement: Decreased awareness of safety Awareness: Intellectual   General Comments: tangential, manic      Exercises      General Comments        Pertinent Vitals/Pain Pain Assessment: 0-10 Pain Score: 6  Faces Pain Scale: Hurts even more Pain Location: L leg Pain Descriptors / Indicators: Discomfort;Grimacing Pain Intervention(s): Premedicated before session;Limited activity within patient's tolerance;Monitored during session  Home Living                      Prior Function            PT Goals (current goals can now be found in the care plan section) Acute Rehab PT Goals Patient Stated Goal: to be independent again PT Goal Formulation: With patient Time For Goal Achievement: 02/21/18 Potential to Achieve Goals: Good Progress towards PT goals: Progressing toward goals    Frequency    Min 3X/week      PT Plan Current plan remains appropriate     Co-evaluation              AM-PAC PT "6 Clicks" Daily Activity  Outcome Measure  Difficulty turning over in bed (including adjusting bedclothes, sheets and blankets)?: None Difficulty moving from lying on back to sitting on the side of the bed? : A Little Difficulty sitting down on and standing up from a chair with arms (e.g., wheelchair, bedside commode, etc,.)?: A Lot Help needed moving to and from a bed to chair (including a wheelchair)?: A Lot Help needed walking in hospital room?: Total Help needed climbing 3-5 steps with a railing? : Total 6 Click Score: 13    End of Session   Activity Tolerance: Patient tolerated treatment well Patient left: in chair;with call bell/phone within reach;with chair alarm set Nurse Communication: Mobility status PT Visit Diagnosis: Unsteadiness on feet (R26.81);Other abnormalities of gait and mobility (R26.89);Muscle weakness (generalized) (M62.81);History of falling (Z91.81)     Time: 1115-1140 PT Time Calculation (min) (ACUTE ONLY): 25 min  Charges:  $Therapeutic Activity: 23-37 mins                      Michel Santee 02/09/2018, 11:55 AM

## 2018-02-09 NOTE — Progress Notes (Signed)
PROGRESS NOTE    [name]  JFH:[id number] DOB: 1956-03-22 DOA: 02/03/2018 PCP: [name], MD    Brief Narrative: 62 year old male patient with history of medical noncompliance, polysubstance abuse, and diabetes.  Admitted 10/22 with hyperglycemia, diabetic ketoacidosis, dehydration and renal failure after a fall at home.  Initially was alert and oriented but became more encephalopathic overnight.  Critical care asked to see on 10/23 for progressive metabolic encephalopathy.  10/22 admitted with DKA, AKI, dehydration after a fall.  Diagnostic imaging negative for acute findings on CT head or C-spine.  Was alert and oriented on admission 10/23: Anion gap closed overnight, renal function improved some, however became progressively encephalopathic overnight requiring escalating Lorazepam critical care asked to see given concern about mental status, airway protection, and critical care services.  Patient was briefly on Precedex drip and was weaned off and he was transferred to [facility] on 10/26.  10/28, pt reports the leg pain is better. Still coughing. Had fever yesterday.     Assessment & Plan:   Principal Problem:   DKA (diabetic ketoacidoses) ([place]) Active Problems:   COPD, mild ([place])   Acute kidney injury ([place])   Essential hypertension   Syncope   Leukocytosis   Acute metabolic encephalopathy   Demand ischemia of myocardium ([name]   Abrasions of multiple sites   Acute metabolic encephalopathy probably secondary to withdrawal symptoms from ETOH.  Weaned off precedex and transferred to [facility] .  He is able to answer questions appropriately.  Patient does not have any withdrawal symptoms at this time.  Continue to monitor on the same medications.   Sleep apnea: CPAP as inpatient.    AKI:  Improving with IV fluids.  Creatinine appears to be at baseline at this time No changes in meds.   NSTEMI;  Probably demand ischemia from DKA.  Cardiology signed off . No CATH .     DKA; Resolved.   Hypokalemia replaced.  Repeat in am.    Hypophosphatemia:  Replaced. Repeat in am show normal levels.   Diabetes mellitus uncontrolled from hyperglycemia due to non compliant to diet. CBG (last 3)  Recent Labs    02/09/18 0736 02/09/18 1116 02/09/18 1611  GLUCAP 373* 372* 237*   Resume SSI, change to moderate SSI,  Increase lantus to 25 units.  Increase the  novolog from 3 to 5  units TIDAC.     Aspiration pneumonitis: CXR shows persistent left lung pneumonia.  Change antibiotics to unasyn, if he continues to have fever, will change to zosyn and add vancomycin.     Hypertensive urgency:  Prn hydralazine.    Anemia of chronic disease:  Hemoglobin around 11.     Leukocytosis: Suspect from aspiration pneumonia.    Fever Suspect ongoing aspiration get blood cultures , neg so far.  CXR shows persistent left lung pneumonia. Changed oral augmentin to IV unasyn.  Monitor on IV antibiotics.     DVT prophylaxis: heparin sq Code Status: full code.  Family Communication: none at bedside.  Disposition Plan: pending clinical improvement.    Consultants:   None.    Procedures: none.    Antimicrobials: unasyn to augmentin.      Subjective: Pt reports leg pain is better.  Still coughing.   Objective: Vitals:   02/09/18 0547 02/09/18 0857 02/09/18 1323 02/09/18 1549  BP: 129/65 (!) 156/70 (!) 120/48 (!) 146/60  Pulse: 79 79 74 77  Resp: 20 17 20 16   Temp: 98.5 F (36.9 C) 98.5 F (  36.9 C) 98.6 F (37 C) 97.7 F (36.5 C)  TempSrc: Oral Oral Oral Oral  SpO2: 94% 94% 98% 99%  Weight:      Height:        Intake/Output Summary (Last 24 hours) at 02/09/2018 1720 Last data filed at 02/09/2018 1600 Gross per 24 hour  Intake -  Output 1200 ml  Net -1200 ml   Filed Weights   02/03/18 2009 02/04/18 1317 02/05/18 0100  Weight: 71.4 kg 68.3 kg 67.6 kg    Examination:  General exam: calm and comfortable. NO DISTRESS.   Respiratory system: good air entry fair, no wheezing o r rhonchi.  Cardiovascular system: S1 & S2 heard, RRR. No JVD, Gastrointestinal system: Abdomen is soft, NT ND BS+ Central nervous system: Alert and following simple commands. Extremities: Symmetric 5 x 5 power. No pedal edema.  Skin: No rashes, lesions or ulcers Psychiatry:  Mood & affect appropriate.     Data Reviewed: I have personally reviewed following labs and imaging studies  CBC: Recent Labs  Lab 02/03/18 2005 02/04/18 0548 02/05/18 0322 02/07/18 0634 02/09/18 0707  WBC 26.5* 23.9* 15.5* 14.7* 11.5*  NEUTROABS 24.4*  --   --   --   --   HGB 11.5* 11.1* 10.5* 11.3* 9.9*  HCT 37.5* 33.3* 31.9* 35.2* 30.2*  MCV 98.4 89.0 91.7 90.7 92.1  PLT 426* 392 305 272 409   Basic Metabolic Panel: Recent Labs  Lab 02/05/18 1148 02/06/18 0324 02/07/18 0634 02/07/18 1221 02/08/18 1706 02/09/18 0707  NA 146* 145 139  --  138 136  K 4.0 3.7 3.0*  --  4.0 3.9  CL 112* 110 105  --  101 102  CO2 28 27 24   --  23 27  GLUCOSE 86 125* 259*  --  228* 348*  BUN 25* 14 10  --  25* 26*  CREATININE 1.08 0.97 1.15  --  1.57* 1.44*  CALCIUM 8.9 8.9 8.5*  --  8.2* 8.0*  MG  --  2.0  --  1.8  --   --   PHOS  --   --   --  1.8*  --  2.5   GFR: Estimated Creatinine Clearance: 51.5 mL/min (A) (by C-G formula based on SCr of 1.44 mg/dL (H)). Liver Function Tests: Recent Labs  Lab 02/03/18 2005  AST 27  ALT 23  ALKPHOS 115  BILITOT 2.0*  PROT 6.5  ALBUMIN 3.1*   No results for input(s): LIPASE, AMYLASE in the last 168 hours. No results for input(s): AMMONIA in the last 168 hours. Coagulation Profile: No results for input(s): INR, PROTIME in the last 168 hours. Cardiac Enzymes: Recent Labs  Lab 02/04/18 0913 02/04/18 1527 02/04/18 1953  TROPONINI 2.70* 2.16* 1.93*   BNP (last 3 results) No results for input(s): PROBNP in the last 8760 hours. HbA1C: No results for input(s): HGBA1C in the last 72 hours. CBG: Recent  Labs  Lab 02/08/18 1728 02/08/18 2224 02/09/18 0736 02/09/18 1116 02/09/18 1611  GLUCAP 227* 143* 373* 372* 237*   Lipid Profile: No results for input(s): CHOL, HDL, LDLCALC, TRIG, CHOLHDL, LDLDIRECT in the last 72 hours. Thyroid Function Tests: No results for input(s): TSH, T4TOTAL, FREET4, T3FREE, THYROIDAB in the last 72 hours. Anemia Panel: No results for input(s): VITAMINB12, FOLATE, FERRITIN, TIBC, IRON, RETICCTPCT in the last 72 hours. Sepsis Labs: No results for input(s): PROCALCITON, LATICACIDVEN in the last 168 hours.  Recent Results (from the past 240 hour(s))  Culture, blood (routine  x 2)     Status: None   Collection Time: 02/04/18  1:15 AM  Result Value Ref Range Status   Specimen Description BLOOD RIGHT HAND  Final   Special Requests   Final    BOTTLES DRAWN AEROBIC AND ANAEROBIC Blood Culture results may not be optimal due to an inadequate volume of blood received in culture bottles   Culture   Final    NO GROWTH 5 DAYS Performed at [facility], [place] [address] [place], [place] [address]    Report Status 02/09/2018 FINAL  Final  Culture, blood (routine x 2)     Status: None   Collection Time: 02/04/18  1:27 AM  Result Value Ref Range Status   Specimen Description BLOOD LEFT HAND  Final   Special Requests   Final    BOTTLES DRAWN AEROBIC AND ANAEROBIC Blood Culture results may not be optimal due to an inadequate volume of blood received in culture bottles   Culture   Final    NO GROWTH 5 DAYS Performed at [facility], [place] [address] [place], [place] [address]    Report Status 02/09/2018 FINAL  Final  Urine Culture     Status: Abnormal   Collection Time: 02/04/18 11:05 AM  Result Value Ref Range Status   Specimen Description URINE, CLEAN CATCH  Final   Special Requests   Final    NONE Performed at [facility], [place] [address], [place], [place] [address]    Culture 10,000 COLONIES/mL STAPHYLOCOCCUS HAEMOLYTICUS (A)  Final   Report  Status 02/06/2018 FINAL  Final   Organism ID, Bacteria STAPHYLOCOCCUS HAEMOLYTICUS (A)  Final      Susceptibility   Staphylococcus haemolyticus - MIC*    CIPROFLOXACIN >=8 RESISTANT Resistant     GENTAMICIN >=16 RESISTANT Resistant     NITROFURANTOIN 32 SENSITIVE Sensitive     OXACILLIN >=4 RESISTANT Resistant     TETRACYCLINE <=1 SENSITIVE Sensitive     VANCOMYCIN 1 SENSITIVE Sensitive     TRIMETH/SULFA >=320 RESISTANT Resistant     CLINDAMYCIN >=8 RESISTANT Resistant     RIFAMPIN <=0.5 SENSITIVE Sensitive     Inducible Clindamycin NEGATIVE Sensitive     * 10,000 COLONIES/mL STAPHYLOCOCCUS HAEMOLYTICUS  MRSA PCR Screening     Status: None   Collection Time: 02/04/18  1:28 PM  Result Value Ref Range Status   MRSA by PCR NEGATIVE NEGATIVE Final    Comment:        The GeneXpert MRSA Assay (FDA approved for NASAL specimens only), is one component of a comprehensive MRSA colonization surveillance program. It is not intended to diagnose MRSA infection nor to guide or monitor treatment for MRSA infections. Performed at [facility], [place] [address], [place], [place] [address]   Culture, blood (Routine X 2) w Reflex to ID Panel     Status: None (Preliminary result)   Collection Time: 02/08/18  5:06 PM  Result Value Ref Range Status   Specimen Description BLOOD BLOOD LEFT HAND  Final   Special Requests   Final    BOTTLES DRAWN AEROBIC ONLY Blood Culture results may not be optimal due to an inadequate volume of blood received in culture bottles   Culture   Final    NO GROWTH < 24 HOURS Performed at [facility], [name] [address], [place], [place] [address]    Report Status PENDING  Incomplete  Culture, blood (Routine X 2) w Reflex to  ID Panel     Status: None (Preliminary result)   Collection Time: 02/08/18  5:06 PM  Result Value Ref Range Status   Specimen Description BLOOD BLOOD RIGHT HAND  Final   Special Requests   Final    BOTTLES DRAWN AEROBIC ONLY Blood Culture  results may not be optimal due to an inadequate volume of blood received in culture bottles   Culture   Final    NO GROWTH < 24 HOURS Performed at [place] [address], [place], [place] [address]    Report Status PENDING  Incomplete         Radiology Studies: Dg Chest 2 View  Result Date: 02/09/2018 CLINICAL DATA:  Fever, aspiration pneumonia. EXAM: CHEST - 2 VIEW COMPARISON:  Portable chest x-ray of February 07, 2018 FINDINGS: The lungs remain mildly hyperinflated. There is a persistent infiltrate in the left lower lobe posterior-medially. There is no pleural effusion or pneumothorax. The heart and pulmonary vascularity are normal. The mediastinum is normal in width. The bony thorax exhibits no acute abnormality. IMPRESSION: Persistent left lower lobe pneumonia. Underlying COPD or reactive airway disease. Electronically Signed   By: David  Martinique M.D.   On: 02/09/2018 10:21        Scheduled Meds: . amLODipine  10 mg Oral Daily  . aspirin  81 mg Oral Daily  . folic acid  1 mg Oral Daily  . gabapentin  300 mg Oral TID  . heparin  5,000 Units Subcutaneous Q8H  . insulin aspart  0-15 Units Subcutaneous TID WC  . insulin aspart  0-5 Units Subcutaneous QHS  . insulin aspart  5 Units Subcutaneous TID WC  . insulin glargine  20 Units Subcutaneous Daily  . mouth rinse  15 mL Mouth Rinse BID  . metoCLOPramide (REGLAN) injection  5 mg Intravenous Q6H  . multivitamin with minerals  1 tablet Oral Daily  . nicotine  14 mg Transdermal Daily  . pneumococcal 23 valent vaccine  0.5 mL Intramuscular Tomorrow-1000  . potassium chloride  40 mEq Oral Once  . thiamine  100 mg Oral Daily   Continuous Infusions: . sodium chloride 10 mL/hr at 02/07/18 1500  . ampicillin-sulbactam (UNASYN) IV 3 g (02/09/18 1251)     LOS: 6 days    Time spent: 27 min    Hosie Poisson, MD [name] Pager [phone]  If 7PM-7AM, please contact night-coverage [email or web address] Password  [email or web address] 02/09/2018, 5:20 PM

## 2018-02-09 NOTE — Progress Notes (Signed)
Pharmacy Antibiotic Note  [name] is a 62 y.o. male admitted on 02/03/2018 with aspiration PNA.  Pharmacy has been consulted for ampicillin/sulbactam dosing.  Plan: Ampicillin/subactam 3gm IV q8h F/u renal function, clinical course, and deescalation  Height: 6' (182.9 cm) Weight: 149 lb 0.5 oz (67.6 kg) IBW/kg (Calculated) : 77.6  Temp (24hrs), Avg:99.3 F (37.4 C), Min:98.5 F (36.9 C), Max:100.4 F (38 C)  Recent Labs  Lab 02/03/18 2005  02/04/18 0548 02/05/18 0322 02/05/18 1148 02/06/18 0324 02/07/18 0634 02/08/18 1706 02/09/18 0707  WBC 26.5*  --  23.9* 15.5*  --   --  14.7*  --  11.5*  CREATININE 3.13*   < > 2.24* 1.45* 1.08 0.97 1.15 1.57* 1.44*   < > = values in this interval not displayed.    Estimated Creatinine Clearance: 51.5 mL/min (A) (by C-G formula based on SCr of 1.44 mg/dL (H)).    Allergies  Allergen Reactions  . Codeine Itching    Tolerates hydrocodone/apap   Calyn Rubi A. Levada Dy, PharmD, Center Pager: [phone] Please utilize Amion for appropriate phone number to reach the unit pharmacist ([place])    02/09/2018 11:48 AM

## 2018-02-09 NOTE — Progress Notes (Signed)
PT Cancellation Note  Patient Details Name: [name] MRN: [id number] DOB: February 26, 1956   Cancelled Treatment:    Reason Eval/Treat Not Completed: Patient at procedure or test/unavailable   Michel Santee 02/09/2018, 11:55 AM

## 2018-02-10 LAB — GLUCOSE, CAPILLARY
GLUCOSE-CAPILLARY: 53 mg/dL — AB (ref 70–99)
GLUCOSE-CAPILLARY: 99 mg/dL (ref 70–99)
GLUCOSE-CAPILLARY: 71 mg/dL (ref 70–99)
Glucose-Capillary: 224 mg/dL — ABNORMAL HIGH (ref 70–99)
Glucose-Capillary: 71 mg/dL (ref 70–99)
Glucose-Capillary: 178 mg/dL — ABNORMAL HIGH (ref 70–99)

## 2018-02-10 LAB — CBC
HCT: 27.7 % — ABNORMAL LOW (ref 39.0–52.0)
Hemoglobin: 8.6 g/dL — ABNORMAL LOW (ref 13.0–17.0)
MCH: 28.8 pg (ref 26.0–34.0)
MCHC: 31 g/dL (ref 30.0–36.0)
MCV: 92.6 fL (ref 80.0–100.0)
NRBC: 0 % (ref 0.0–0.2)
PLATELETS: 267 10*3/uL (ref 150–400)
RBC: 2.99 MIL/uL — AB (ref 4.22–5.81)
RDW: 13.1 % (ref 11.5–15.5)
WBC: 9.4 10*3/uL (ref 4.0–10.5)

## 2018-02-10 LAB — BASIC METABOLIC PANEL
ANION GAP: 7 (ref 5–15)
BUN: 20 mg/dL (ref 8–23)
CO2: 29 mmol/L (ref 22–32)
Calcium: 8 mg/dL — ABNORMAL LOW (ref 8.9–10.3)
Chloride: 102 mmol/L (ref 98–111)
Creatinine, Ser: 1.29 mg/dL — ABNORMAL HIGH (ref 0.61–1.24)
GFR calc Af Amer: >60 mL/min (ref >60)
GFR, EST NON AFRICAN AMERICAN: 58 mL/min — AB (ref >60)
Glucose, Bld: 233 mg/dL — ABNORMAL HIGH (ref 70–99)
POTASSIUM: 4.4 mmol/L (ref 3.5–5.1)
SODIUM: 138 mmol/L (ref 135–145)

## 2018-02-10 MED ORDER — BENZONATATE 100 MG PO CAPS: 200.0000 mg | ORAL_CAPSULE | Freq: Three times a day (TID) | ORAL | Status: DC | PRN

## 2018-02-10 MED ORDER — INSULIN GLARGINE 100 UNIT/ML ~~LOC~~ SOLN
25.0000 [IU] | Freq: Every day | SUBCUTANEOUS | Status: DC
  Administered 2018-02-11 – 2018-02-16 (×6): 25 [IU] via SUBCUTANEOUS
  Filled 2018-02-10 (×6): qty 0.25

## 2018-02-10 MED ORDER — GLUCAGON HCL RDNA (DIAGNOSTIC) 1 MG IJ SOLR
INTRAMUSCULAR | Status: AC
  Filled 2018-02-10: qty 1

## 2018-02-10 MED ORDER — INSULIN ASPART 100 UNIT/ML ~~LOC~~ SOLN: 2.0000 [IU] | Freq: Three times a day (TID) | SUBCUTANEOUS | Status: DC

## 2018-02-10 MED ORDER — DEXTROSE 50 % IV SOLN
25.0000 g | Freq: Once | INTRAVENOUS | Status: AC
  Administered 2018-02-10: 25 g via INTRAVENOUS

## 2018-02-10 NOTE — Progress Notes (Signed)
PROGRESS NOTE    [name]  STM:[id number] DOB: Mar 13, 1956 DOA: 02/03/2018 PCP: [name], MD    Brief Narrative: 62 year old male patient with history of medical noncompliance, polysubstance abuse, and diabetes.  Admitted 10/22 with hyperglycemia, diabetic ketoacidosis, dehydration and renal failure after a fall at home.  Initially was alert and oriented but became more encephalopathic overnight.  Critical care asked to see on 10/23 for progressive metabolic encephalopathy.  10/22 admitted with DKA, AKI, dehydration after a fall.  Diagnostic imaging negative for acute findings on CT head or C-spine.  Was alert and oriented on admission 10/23: Anion gap closed overnight, renal function improved some, however became progressively encephalopathic overnight requiring escalating Lorazepam critical care asked to see given concern about mental status, airway protection, and critical care services.  Patient was briefly on Precedex drip and was weaned off and he was transferred to [facility] on 10/26.  10/29 RN reports patient has been coughing while eating, hence put him NPO till SLP re evaluates.  He is alert , answering questions and following commands.     Assessment & Plan:   Principal Problem:   DKA (diabetic ketoacidoses) ([place]) Active Problems:   COPD, mild ([place])   Acute kidney injury ([place])   Essential hypertension   Syncope   Leukocytosis   Acute metabolic encephalopathy   Demand ischemia of myocardium ([name]   Abrasions of multiple sites   Acute metabolic encephalopathy probably secondary to withdrawal symptoms from ETOH.  Weaned off precedex and transferred to [facility] .  He is able to answer questions appropriately.  Patient does not have any withdrawal symptoms at this time.  Continue to monitor on the same medications.    Sleep apnea: CPAP as inpatient.    AKI:  Improving with IV fluids, creatinine is at 1.29.  No changes in meds.   NSTEMI;  Probably demand ischemia  from DKA.  Cardiology signed off . No CATH .    DKA; Resolved.   Hypokalemia replaced.  Repeat in am.    Hypophosphatemia:  Replaced. Repeat in am show normal levels.   Diabetes mellitus uncontrolled from hyperglycemia due to non compliant to diet. CBG (last 3)  Recent Labs    02/09/18 2116 02/10/18 0636 02/10/18 1121  GLUCAP 131* 224* 71   Resume SSI, change to moderate SSI,  As his cbgs are coming down, discontinued the novolog and decreased the lantus to 25 units daily.     Aspiration pneumonitis: CXR shows persistent left lung pneumonia.  Change antibiotics to unasyn,. Afebrile and wbc count normalized.     Hypertensive urgency:  Prn hydralazine.    Anemia of chronic disease:  Hemoglobin around 11.     Leukocytosis: Suspect from aspiration pneumonia. Resolved.    Fever Suspect ongoing aspiration get blood cultures , neg so far.  CXR shows persistent left lung pneumonia. Changed oral augmentin to IV unasyn.  Monitor on IV antibiotics. Afebrile , wbc count normalized, will continue IV unasyn for another 24 hours and transition to augmentin tomorrow .     DVT prophylaxis: heparin sq Code Status: full code.  Family Communication: none at bedside.  Disposition Plan: pending clinical improvement.    Consultants:   None.    Procedures: none.    Antimicrobials: unasyn to augmentin tomorrow.      Subjective: Wants some pain medication for leg pain.  Coughing while eating.  Made NPO for SLP evaluation.    Objective: Vitals:   02/09/18 1549 02/09/18 2336 02/10/18  0422 02/10/18 0837  BP: (!) 146/60 (!) 141/48 (!) 129/47 (!) 153/63  Pulse: 77 80 76 83  Resp: 16 20 18 20   Temp: 97.7 F (36.5 C) 98.2 F (36.8 C) 98.1 F (36.7 C) 98.1 F (36.7 C)  TempSrc: Oral Oral Oral Oral  SpO2: 99% 97% 97% 96%  Weight:      Height:        Intake/Output Summary (Last 24 hours) at 02/10/2018 1334 Last data filed at 02/10/2018 0530 Gross per 24  hour  Intake 300 ml  Output 1200 ml  Net -900 ml   Filed Weights   02/03/18 2009 02/04/18 1317 02/05/18 0100  Weight: 71.4 kg 68.3 kg 67.6 kg    Examination:  General exam: in good spirits not in any distress.  Respiratory system: air entry fair bilateral, no wheezing or rhonchi.  Cardiovascular system: S1 & S2 heard, RRR. No JVD, Gastrointestinal system: Abdomen is soft, non tender non distended bowel sounds good.  Central nervous system: Alert and following simple commands. Extremities: Symmetric 5 x 5 power. No pedal edema.  Skin: No rashes, lesions or ulcers Psychiatry:  Mood & affect appropriate.     Data Reviewed: I have personally reviewed following labs and imaging studies  CBC: Recent Labs  Lab 02/03/18 2005 02/04/18 0548 02/05/18 0322 02/07/18 0634 02/09/18 0707 02/10/18 0329  WBC 26.5* 23.9* 15.5* 14.7* 11.5* 9.4  NEUTROABS 24.4*  --   --   --   --   --   HGB 11.5* 11.1* 10.5* 11.3* 9.9* 8.6*  HCT 37.5* 33.3* 31.9* 35.2* 30.2* 27.7*  MCV 98.4 89.0 91.7 90.7 92.1 92.6  PLT 426* 392 305 272 256 361   Basic Metabolic Panel: Recent Labs  Lab 02/06/18 0324 02/07/18 0634 02/07/18 1221 02/08/18 1706 02/09/18 0707 02/10/18 0329  NA 145 139  --  138 136 138  K 3.7 3.0*  --  4.0 3.9 4.4  CL 110 105  --  101 102 102  CO2 27 24  --  23 27 29   GLUCOSE 125* 259*  --  228* 348* 233*  BUN 14 10  --  25* 26* 20  CREATININE 0.97 1.15  --  1.57* 1.44* 1.29*  CALCIUM 8.9 8.5*  --  8.2* 8.0* 8.0*  MG 2.0  --  1.8  --   --   --   PHOS  --   --  1.8*  --  2.5  --    GFR: Estimated Creatinine Clearance: 57.5 mL/min (A) (by C-G formula based on SCr of 1.29 mg/dL (H)). Liver Function Tests: Recent Labs  Lab 02/03/18 2005  AST 27  ALT 23  ALKPHOS 115  BILITOT 2.0*  PROT 6.5  ALBUMIN 3.1*   No results for input(s): LIPASE, AMYLASE in the last 168 hours. No results for input(s): AMMONIA in the last 168 hours. Coagulation Profile: No results for input(s):  INR, PROTIME in the last 168 hours. Cardiac Enzymes: Recent Labs  Lab 02/04/18 0913 02/04/18 1527 02/04/18 1953  TROPONINI 2.70* 2.16* 1.93*   BNP (last 3 results) No results for input(s): PROBNP in the last 8760 hours. HbA1C: No results for input(s): HGBA1C in the last 72 hours. CBG: Recent Labs  Lab 02/09/18 1611 02/09/18 1944 02/09/18 2116 02/10/18 0636 02/10/18 1121  GLUCAP 237* 134* 131* 224* 71   Lipid Profile: No results for input(s): CHOL, HDL, LDLCALC, TRIG, CHOLHDL, LDLDIRECT in the last 72 hours. Thyroid Function Tests: No results for input(s): TSH, T4TOTAL,  FREET4, T3FREE, THYROIDAB in the last 72 hours. Anemia Panel: No results for input(s): VITAMINB12, FOLATE, FERRITIN, TIBC, IRON, RETICCTPCT in the last 72 hours. Sepsis Labs: No results for input(s): PROCALCITON, LATICACIDVEN in the last 168 hours.  Recent Results (from the past 240 hour(s))  Culture, blood (routine x 2)     Status: None   Collection Time: 02/04/18  1:15 AM  Result Value Ref Range Status   Specimen Description BLOOD RIGHT HAND  Final   Special Requests   Final    BOTTLES DRAWN AEROBIC AND ANAEROBIC Blood Culture results may not be optimal due to an inadequate volume of blood received in culture bottles   Culture   Final    NO GROWTH 5 DAYS Performed at [facility], [place] [address], [place], [place] [address]    Report Status 02/09/2018 FINAL  Final  Culture, blood (routine x 2)     Status: None   Collection Time: 02/04/18  1:27 AM  Result Value Ref Range Status   Specimen Description BLOOD LEFT HAND  Final   Special Requests   Final    BOTTLES DRAWN AEROBIC AND ANAEROBIC Blood Culture results may not be optimal due to an inadequate volume of blood received in culture bottles   Culture   Final    NO GROWTH 5 DAYS Performed at [facility], [place] [address], [place], [place] [address]    Report Status 02/09/2018 FINAL  Final  Urine Culture     Status: Abnormal    Collection Time: 02/04/18 11:05 AM  Result Value Ref Range Status   Specimen Description URINE, CLEAN CATCH  Final   Special Requests   Final    NONE Performed at [facility], [place] [address], [place], [place] [address]    Culture 10,000 COLONIES/mL STAPHYLOCOCCUS HAEMOLYTICUS (A)  Final   Report Status 02/06/2018 FINAL  Final   Organism ID, Bacteria STAPHYLOCOCCUS HAEMOLYTICUS (A)  Final      Susceptibility   Staphylococcus haemolyticus - MIC*    CIPROFLOXACIN >=8 RESISTANT Resistant     GENTAMICIN >=16 RESISTANT Resistant     NITROFURANTOIN 32 SENSITIVE Sensitive     OXACILLIN >=4 RESISTANT Resistant     TETRACYCLINE <=1 SENSITIVE Sensitive     VANCOMYCIN 1 SENSITIVE Sensitive     TRIMETH/SULFA >=320 RESISTANT Resistant     CLINDAMYCIN >=8 RESISTANT Resistant     RIFAMPIN <=0.5 SENSITIVE Sensitive     Inducible Clindamycin NEGATIVE Sensitive     * 10,000 COLONIES/mL STAPHYLOCOCCUS HAEMOLYTICUS  MRSA PCR Screening     Status: None   Collection Time: 02/04/18  1:28 PM  Result Value Ref Range Status   MRSA by PCR NEGATIVE NEGATIVE Final    Comment:        The GeneXpert MRSA Assay (FDA approved for NASAL specimens only), is one component of a comprehensive MRSA colonization surveillance program. It is not intended to diagnose MRSA infection nor to guide or monitor treatment for MRSA infections. Performed at [facility], [place] [address] [place],  [address]   Culture, blood (Routine X 2) w Reflex to ID Panel     Status: None (Preliminary result)   Collection Time: 02/08/18  5:06 PM  Result Value Ref Range Status   Specimen Description BLOOD BLOOD LEFT HAND  Final   Special Requests   Final    BOTTLES DRAWN AEROBIC ONLY Blood Culture results may not be optimal due to an inadequate volume  of blood received in culture bottles   Culture   Final    NO GROWTH 2 DAYS Performed at [facility], [place] [address], [place], [place] [address]    Report Status  PENDING  Incomplete  Culture, blood (Routine X 2) w Reflex to ID Panel     Status: None (Preliminary result)   Collection Time: 02/08/18  5:06 PM  Result Value Ref Range Status   Specimen Description BLOOD BLOOD RIGHT HAND  Final   Special Requests   Final    BOTTLES DRAWN AEROBIC ONLY Blood Culture results may not be optimal due to an inadequate volume of blood received in culture bottles   Culture   Final    NO GROWTH 2 DAYS Performed at [facility], [place] [address], [place], [place] [address]    Report Status PENDING  Incomplete         Radiology Studies: Dg Chest 2 View  Result Date: 02/09/2018 CLINICAL DATA:  Fever, aspiration pneumonia. EXAM: CHEST - 2 VIEW COMPARISON:  Portable chest x-ray of February 07, 2018 FINDINGS: The lungs remain mildly hyperinflated. There is a persistent infiltrate in the left lower lobe posterior-medially. There is no pleural effusion or pneumothorax. The heart and pulmonary vascularity are normal. The mediastinum is normal in width. The bony thorax exhibits no acute abnormality. IMPRESSION: Persistent left lower lobe pneumonia. Underlying COPD or reactive airway disease. Electronically Signed   By: David  Martinique M.D.   On: 02/09/2018 10:21   Dg Swallowing Func-speech Pathology  Result Date: 02/10/2018 Objective Swallowing Evaluation: Type of Study: MBS-Modified Barium Swallow Study  Patient Details Name: ORREN PIETSCH MRN: [id number] Date of Birth: 1955/08/26 Today's Date: 02/10/2018 Time: SLP Start Time (ACUTE ONLY): 1007 -SLP Stop Time (ACUTE ONLY): 1038 SLP Time Calculation (min) (ACUTE ONLY): 31 min Past Medical History: Past Medical History: Diagnosis Date . DEPRESSION  . DIABETES MELLITUS, TYPE I  . DRUG ABUSE   pt should have NO controlled substances rx'ed . Glaucoma  . HEPATITIS C   chronic . Hypertension 02/18/2011 . Proliferative diabetic retinopathy(362.02)  . Vitiligo  Past Surgical History: Past Surgical History: Procedure Laterality Date .  ABDOMINAL AORTOGRAM W/LOWER EXTREMITY N/A 07/25/2016  Procedure: Abdominal Aortogram w/Bilateral Lower Extremity Runoff;  Surgeon: Conrad Portage [place], MD;  Location: [place] [facility];  Service: Cardiovascular;  Laterality: N/A; . ABDOMINAL AORTOGRAM W/LOWER EXTREMITY N/A 12/24/2016  Procedure: ABDOMINAL AORTOGRAM W/LOWER EXTREMITY;  Surgeon: Serafina Mitchell, MD;  Location: [place] [facility];  Service: Cardiovascular;  Laterality: N/A; . AMPUTATION TOE Right 07/26/2016  Procedure: AMPUTATION GREAT TOE;  Surgeon: Serafina Mitchell, MD;  Location: [place];  Service: Vascular;  Laterality: Right; . EYE SURGERY    retinal surgery x 2, right eye . INSERTION OF ILIAC STENT Right 07/26/2016  Procedure: INSERTION OF RIGHT POPLITEAL STENT WITH BALLOON ANGIOPLASTY;  Surgeon: Serafina Mitchell, MD;  Location: [place];  Service: Vascular;  Laterality: Right; . LOWER EXTREMITY ANGIOGRAM Right 07/26/2016  Procedure: LOWER EXTREMITY ANGIOGRAM;  Surgeon: Serafina Mitchell, MD;  Location: [place];  Service: Vascular;  Laterality: Right; . PERIPHERAL VASCULAR BALLOON ANGIOPLASTY Right 07/26/2016  Procedure: PERIPHERAL VASCULAR BALLOON ANGIOPLASTY  RIGHT ANTERIOR TIBEAL ARTERY AND RIGHT SUPERFICIAL FEMORAL ARTERY;  Surgeon: Serafina Mitchell, MD;  Location: [place];  Service: Vascular;  Laterality: Right; . PERIPHERAL VASCULAR INTERVENTION Right 12/24/2016  Procedure: PERIPHERAL VASCULAR INTERVENTION;  Surgeon: Serafina Mitchell, MD;  Location: [place] [facility];  Service:  Cardiovascular;  Laterality: Right;  lower extr HPI: Pt is a 62 y/o male with history of medical noncompliance, polysubstance abuse, and diabetes.  Admitted 10/22 with hyperglycemia, diabetic ketoacidosis, dehydration and renal failure after a fall at home.  Initially was alert and oriented but became more encephalopathic overnight. Has had some episodes of vomiting.  Pt underwent BSE yesterday and was placed on dys1/thin diet.  Heard pt coughing from outside the room and RN reports he took  pt's meal away due to aspiration concerns.  Follow up to assess tolerance and indication for instrumental evaluation.   Subjective: "I cough because I have COPD" Assessment / Plan / Recommendation CHL IP CLINICAL IMPRESSIONS 02/09/2018 Clinical Impression Patient presents with moderate sensorimotor oropharyngeal dysphagia - Oral control impaired with liquids mostly resulting in premature spillage of oral residuals to pyriform sinus with liquids.  Thin liquid pooling in pyriform sinus then spills into open airway with next swallow resulting in silent aspiration.  Dry swallows on command were difficult for pt to perform. Head turn left *pt's reported weak side* did not improve airway protection or pharyngeal clearance.  Recommend pt solids be advanced to dys3/ground meats and modify to nectar thick liquids with free water between meals.  Pt will benefit from aggressive SlP to improve oral and pharyngeal strenghening as he states he is motivated.  Using live images/flouro loops educated pt to findings and reinforced effective strategies.   SLP Visit Diagnosis Dysphagia, oropharyngeal phase (R13.12) Attention and concentration deficit following -- Frontal lobe and executive function deficit following -- Impact on safety and function Moderate aspiration risk   CHL IP TREATMENT RECOMMENDATION 02/09/2018 Treatment Recommendations Therapy as outlined in treatment plan below   Prognosis 02/09/2018 Prognosis for Safe Diet Advancement Fair Barriers to Reach Goals Time post onset Barriers/Prognosis Comment -- CHL IP DIET RECOMMENDATION 02/09/2018 SLP Diet Recommendations Dysphagia 3 (Mech soft) solids;Nectar thick liquid;Other (Comment);Free water protocol after oral care Liquid Administration via Cup;No straw Medication Administration Whole meds with puree Compensations Slow rate;Small sips/bites;Follow solids with liquid;Effortful swallow Postural Changes Remain semi-upright after after feeds/meals (Comment);Seated upright at  90 degrees   CHL IP OTHER RECOMMENDATIONS 02/09/2018 Recommended Consults -- Oral Care Recommendations Oral care BID Other Recommendations --   CHL IP FOLLOW UP RECOMMENDATIONS 02/09/2018 Follow up Recommendations Home health SLP;Skilled Nursing facility   [facility] IP FREQUENCY AND DURATION 02/09/2018 Speech Therapy Frequency (ACUTE ONLY) min 2x/week Treatment Duration 2 weeks      CHL IP ORAL PHASE 02/09/2018 Oral Phase Impaired Oral - Pudding Teaspoon -- Oral - Pudding Cup -- Oral - Honey Teaspoon -- Oral - Honey Cup -- Oral - Nectar Teaspoon -- Oral - Nectar Cup Decreased bolus cohesion;Premature spillage Oral - Nectar Straw -- Oral - Thin Teaspoon Decreased bolus cohesion;Reduced posterior propulsion Oral - Thin Cup Decreased bolus cohesion;Lingual/palatal residue;Reduced posterior propulsion;Premature spillage Oral - Thin Straw Decreased bolus cohesion;Lingual/palatal residue;Reduced posterior propulsion;Premature spillage Oral - Puree Reduced posterior propulsion;Premature spillage Oral - Mech Soft -- Oral - Regular Decreased bolus cohesion;Reduced posterior propulsion;Premature spillage Oral - Multi-Consistency Reduced posterior propulsion;Lingual/palatal residue;Decreased bolus cohesion;Premature spillage Oral - Pill -- Oral Phase - Comment --  CHL IP PHARYNGEAL PHASE 02/09/2018 Pharyngeal Phase Impaired Pharyngeal- Pudding Teaspoon -- Pharyngeal -- Pharyngeal- Pudding Cup -- Pharyngeal -- Pharyngeal- Honey Teaspoon -- Pharyngeal -- Pharyngeal- Honey Cup -- Pharyngeal -- Pharyngeal- Nectar Teaspoon -- Pharyngeal -- Pharyngeal- Nectar Cup Reduced epiglottic inversion Pharyngeal -- Pharyngeal- Nectar Straw -- Pharyngeal -- Pharyngeal- Thin Teaspoon Delayed swallow initiation-pyriform sinuses Pharyngeal --  Pharyngeal- Thin Cup Delayed swallow initiation-pyriform sinuses;Reduced epiglottic inversion;Penetration/Aspiration during swallow;Trace aspiration;Penetration/Aspiration before swallow Pharyngeal Material enters  airway, passes BELOW cords without attempt by patient to eject out (silent aspiration) Pharyngeal- Thin Straw Delayed swallow initiation-pyriform sinuses;Trace aspiration;Penetration/Aspiration during swallow Pharyngeal Material enters airway, passes BELOW cords without attempt by patient to eject out (silent aspiration) Pharyngeal- Puree Reduced epiglottic inversion Pharyngeal -- Pharyngeal- Mechanical Soft -- Pharyngeal -- Pharyngeal- Regular Reduced epiglottic inversion Pharyngeal -- Pharyngeal- Multi-consistency Reduced epiglottic inversion;Penetration/Aspiration during swallow;Trace aspiration;Delayed swallow initiation-pyriform sinuses Pharyngeal Material enters airway, passes BELOW cords without attempt by patient to eject out (silent aspiration);Material enters airway, passes BELOW cords and not ejected out despite cough attempt by patient Pharyngeal- Pill -- Pharyngeal -- Pharyngeal Comment pt with aspiration of oral residuals of thin spilling from pyriform sinus into larynx/trachea with swallowing, small bolus with cognitive readiness "1,2,3" to swallow entire bolus helpful,  chin tuck posture worsened aspiration as it dumped residuals into larynx/trachea, cued "hock" did not clear residuals but was strong and rec pt continue this strategie  CHL IP CERVICAL ESOPHAGEAL PHASE 02/09/2018 Cervical Esophageal Phase WFL Pudding Teaspoon -- Pudding Cup -- Honey Teaspoon -- Honey Cup -- Nectar Teaspoon -- Nectar Cup -- Nectar Straw -- Thin Teaspoon -- Thin Cup -- Thin Straw -- Puree -- Mechanical Soft -- Regular -- Multi-consistency -- Pill -- Cervical Esophageal Comment -- [name] 02/10/2018, 7:47 AM  Luanna Salk, MS [facility] SLP Acute Rehab Services Pager [phone] Office [phone]                  Scheduled Meds: . amLODipine  10 mg Oral Daily  . aspirin  81 mg Oral Daily  . folic acid  1 mg Oral Daily  . gabapentin  300 mg Oral TID  . heparin  5,000 Units Subcutaneous Q8H  .  insulin aspart  0-20 Units Subcutaneous TID WC  . insulin aspart  0-5 Units Subcutaneous QHS  . insulin aspart  5 Units Subcutaneous TID WC  . insulin glargine  28 Units Subcutaneous Daily  . mouth rinse  15 mL Mouth Rinse BID  . metoCLOPramide (REGLAN) injection  5 mg Intravenous Q6H  . multivitamin with minerals  1 tablet Oral Daily  . nicotine  14 mg Transdermal Daily  . pneumococcal 23 valent vaccine  0.5 mL Intramuscular Tomorrow-1000  . potassium chloride  40 mEq Oral Once  . thiamine  100 mg Oral Daily   Continuous Infusions: . sodium chloride 10 mL/hr at 02/07/18 1500  . ampicillin-sulbactam (UNASYN) IV 3 g (02/10/18 1245)     LOS: 7 days    Time spent: 69 min    Hosie Poisson, MD [name] Pager [phone]  If 7PM-7AM, please contact night-coverage [email or web address] Password [email or web address] 02/10/2018, 1:34 PM

## 2018-02-10 NOTE — Progress Notes (Addendum)
  Speech Language Pathology Treatment: Dysphagia  Patient Details Name: [name] MRN: [id number] DOB: Aug 28, 1955 Today's Date: 02/10/2018 Time: 1710-1800 SLP Time Calculation (min) (ACUTE ONLY): 50 min  Assessment / Plan / Recommendation Clinical Impression  Pt very frustrated but agreed to participate and accept po intake with SLP observation for maximal safety/tolerance assessment.  Pt observed consuming nectar thick juice, icecream, applesauce, graham cracker x2.  52 boluses observed = with pt benefiting from cues to orally prepare and effortful swallow.    Pt coughed x3 total during intake of 52 boluses. One cough presented after intake of nectar via cup - likely aspiration.  Baseline cough x3 also noted before po intake commenced.  Pt admits he was "shoveling" food in and denies dysphagia (decreased awareness).  Recommend full liquid - nectar diet- via tsp only with absolute full supervision.     Advised pt to his improved airway protection with significant caution.  However anticipate pt will not continue to follow precautions.  Recommend a palliative consult be initiated to help establish his goals given his recurrent admissions/onoing dysphagia.  Communicated with Md via texting and received approval for diet.  Thanks!  Will follow up with pt.  Ordered pt's meal tray for dinner assuring he would receive cream soup and banana pudding.    HPI HPI: Pt is a 62 y/o male with history of medical noncompliance, polysubstance abuse, and diabetes.  Admitted 10/22 with hyperglycemia, diabetic ketoacidosis, dehydration and renal failure after a fall at home.  Initially was alert and oriented but became more encephalopathic overnight. Has had some episodes of vomiting.  Pt underwent BSE yesterday and was placed on dys1/thin diet.  Heard pt coughing from outside the room and RN reports he took pt's meal away due to aspiration concerns.  Follow up to assess tolerance after MBS yesterday with diet  modification = however pt was made npo due to continued coughing with po intake.        SLP Plan  Continue with current plan of care       Recommendations  Diet recommendations: Nectar-thick liquid;Other(comment)(full liquids) Liquids provided via: Teaspoon;No straw(tsp only) Supervision: Full supervision/cueing for compensatory strategies(absolute full supervision) Compensations: Slow rate;Effortful swallow Postural Changes and/or Swallow Maneuvers: Seated upright 90 degrees;Upright 30-60 min after meal                Oral Care Recommendations: Oral care BID Follow up Recommendations: (tbd) SLP Visit Diagnosis: Dysphagia, oropharyngeal phase (R13.12) Plan: Continue with current plan of care       [place], [place] [facility] SLP [place] Pager [phone] Office [phone]  [name] 02/10/2018, 6:38 PM

## 2018-02-11 LAB — HEMOGLOBIN AND HEMATOCRIT, BLOOD
HCT: 31.8 % — ABNORMAL LOW (ref 39.0–52.0)
HEMOGLOBIN: 10 g/dL — AB (ref 13.0–17.0)

## 2018-02-11 LAB — GLUCOSE, CAPILLARY
GLUCOSE-CAPILLARY: 239 mg/dL — AB (ref 70–99)
Glucose-Capillary: 258 mg/dL — ABNORMAL HIGH (ref 70–99)
Glucose-Capillary: 135 mg/dL — ABNORMAL HIGH (ref 70–99)
Glucose-Capillary: 151 mg/dL — ABNORMAL HIGH (ref 70–99)

## 2018-02-11 MED ORDER — BENZONATATE 200 MG PO CAPS: 200.0000 mg | ORAL_CAPSULE | Freq: Three times a day (TID) | ORAL | 0 refills | Status: DC | PRN

## 2018-02-11 MED ORDER — HYDRALAZINE HCL 25 MG PO TABS: 25.0000 mg | ORAL_TABLET | Freq: Three times a day (TID) | ORAL | 0 refills | Status: DC

## 2018-02-11 MED ORDER — HYDRALAZINE HCL 25 MG PO TABS
25.0000 mg | ORAL_TABLET | Freq: Three times a day (TID) | ORAL | Status: DC
  Administered 2018-02-11 – 2018-02-16 (×17): 25 mg via ORAL
  Filled 2018-02-11 (×16): qty 1

## 2018-02-11 MED ORDER — METOCLOPRAMIDE HCL 5 MG PO TABS: 5.0000 mg | ORAL_TABLET | Freq: Three times a day (TID) | ORAL | 0 refills | Status: DC

## 2018-02-11 MED ORDER — PANTOPRAZOLE SODIUM 40 MG PO TBEC: 40.0000 mg | DELAYED_RELEASE_TABLET | Freq: Every day | ORAL | 0 refills | Status: DC

## 2018-02-11 MED ORDER — GABAPENTIN 600 MG PO TABS: 300.0000 mg | ORAL_TABLET | Freq: Three times a day (TID) | ORAL | 0 refills | Status: DC

## 2018-02-11 MED ORDER — ASPIRIN 81 MG PO CHEW: 81.0000 mg | CHEWABLE_TABLET | Freq: Every day | ORAL | 0 refills | Status: DC

## 2018-02-11 MED ORDER — INSULIN ASPART 100 UNIT/ML ~~LOC~~ SOLN: SUBCUTANEOUS | 11 refills | Status: DC

## 2018-02-11 MED ORDER — AMOXICILLIN-POT CLAVULANATE 875-125 MG PO TABS: 1.0000 | ORAL_TABLET | Freq: Two times a day (BID) | ORAL | 0 refills | Status: AC

## 2018-02-11 MED ORDER — RESOURCE THICKENUP CLEAR PO POWD: ORAL | Status: DC

## 2018-02-11 MED ORDER — PANTOPRAZOLE SODIUM 40 MG PO TBEC
40.0000 mg | DELAYED_RELEASE_TABLET | Freq: Every day | ORAL | Status: DC
  Administered 2018-02-11 – 2018-02-16 (×5): 40 mg via ORAL
  Filled 2018-02-11 (×6): qty 1

## 2018-02-11 MED ORDER — INSULIN GLARGINE 100 UNIT/ML ~~LOC~~ SOLN: 25.0000 [IU] | Freq: Every day | SUBCUTANEOUS | 11 refills | Status: DC

## 2018-02-11 NOTE — Discharge Summary (Signed)
Physician Discharge Summary  NERI [name] QMG:[id number] DOB: Jan 14, 1956 DOA: 02/03/2018  PCP: [name] Rail, MD  Admit date: 02/03/2018 Discharge date: 02/11/2018  Admitted From: HOME.  Disposition:  SNF.   Recommendations for Outpatient Follow-up:  1. Follow up with PCP in 1-2 weeks 2. Please obtain BMP/CBC in one week Please follow up with a CXR in 4 weeks to evaluate for resolution of the pneumonia.  Please get SLP to follow up at the facility if his swallowing improved .  Discharge Condition:: stable.  CODE STATUS: full code.  Diet recommendation:  Dysphagia 2 diet with nectar thick liquids with strict precautions and full supervision.   Brief/Interim Summary: 62 year old male patient with history of medical noncompliance, polysubstance abuse, and diabetes. Admitted 10/22 with hyperglycemia, diabetic ketoacidosis, dehydration and renal failure after a fall at home. Initially was alert and oriented but became more encephalopathic overnight. Critical care asked to see on 10/23 for progressive metabolic encephalopathy.  10/22 admitted with DKA, AKI, dehydration after a fall. Diagnostic imaging negative for acute findings on CT head or C-spine. Was alert and oriented on admission 10/23: Anion gap closed overnight, renal function improved some, however became progressively encephalopathic overnight requiring escalating Lorazepam critical care asked to see given concern about mental status, airway protection, and critical care services.  Patient was briefly on Precedex drip and was weaned off and he was transferred to [facility] on 10/26.   Due to continuous aspiration , he was restarted on IV unasyn for aspiration pneumonia and recommend another 3 days of augmentin on discharge to complete 10 day of antibiotics.    Discharge Diagnoses:  Principal Problem:   DKA (diabetic ketoacidoses) ([name]) Active Problems:   COPD, mild ([name]   Acute kidney injury ([name])   Essential hypertension    Syncope   Leukocytosis   Acute metabolic encephalopathy   Demand ischemia of myocardium ([name]   Abrasions of multiple sites  Acute metabolic encephalopathy probably secondary to withdrawal symptoms from ETOH.  Weaned off precedex and transferred to [facility] .  He is able to answer questions appropriately.  Patient does not have any withdrawal symptoms at this time.  Continue to monitor on the same medications.    Sleep apnea: CPAP as inpatient.    AKI:  Improving with IV fluids, creatinine is at 1.29.  No changes in meds.   NSTEMI;  Probably demand ischemia from DKA.  Cardiology signed off . No CATH .    DKA; Resolved.   Hypokalemia replaced.     Hypophosphatemia:  Replaced. Repeat in am show normal levels.   Diabetes mellitus uncontrolled from hyperglycemia due to non compliant to diet. CBG (last 3)  RecentLabs(last2labs)       Recent Labs    02/09/18 2116 02/10/18 0636 02/10/18 1121  GLUCAP 131* 224* 71     Resume SSI, change to moderate SSI,  Resume lantus 25 mg daily.     Aspiration pneumonitis: CXR shows persistent left lung pneumonia.  Change antibiotics to unasyn,. Afebrile and wbc count normalized.     Hypertensive urgency:  Improved. Continue with norvasc and hydralazine.    Anemia of chronic disease:  Hemoglobin around 11.     Leukocytosis: Suspect from aspiration pneumonia. Resolved.    Fever Suspect ongoing aspiration get blood cultures , neg so far.  CXR shows persistent left lung pneumonia. Changed oral augmentin to IV unasyn.  Monitor on IV antibiotics. Afebrile , wbc count normalized, will continue IV unasyn today and transition to augmentin  for another 2 to 3 days to complete 10 days of antibiotic.    Discharge Instructions   Allergies as of 02/11/2018      Reactions   Codeine Itching   Tolerates hydrocodone/apap      Medication List    STOP taking these medications   aspirin 81 MG EC  tablet Replaced by:  aspirin 81 MG chewable tablet   insulin NPH Human 100 UNIT/ML injection Commonly known as:  HUMULIN N,NOVOLIN N   nicotine 14 mg/24hr patch Commonly known as:  NICODERM CQ - dosed in mg/24 hours     TAKE these medications   amLODipine 10 MG tablet Commonly known as:  NORVASC Take 1 tablet (10 mg total) by mouth daily.   amoxicillin-clavulanate 875-125 MG tablet Commonly known as:  AUGMENTIN Take 1 tablet by mouth every 12 (twelve) hours for 3 days.   aspirin 81 MG chewable tablet Chew 1 tablet (81 mg total) by mouth daily. Start taking on:  02/12/2018 Replaces:  aspirin 81 MG EC tablet   benzonatate 200 MG capsule Commonly known as:  TESSALON Take 1 capsule (200 mg total) by mouth 3 (three) times daily as needed for cough.   blood glucose meter kit and supplies Kit Dispense based on patient and insurance preference. Use up to four times daily as directed. (FOR ICD-9 250.00, 250.01).   folic acid 1 MG tablet Commonly known as:  FOLVITE Take 1 tablet (1 mg total) by mouth daily.   gabapentin 600 MG tablet Commonly known as:  NEURONTIN Take 0.5 tablets (300 mg total) by mouth 3 (three) times daily.   hydrALAZINE 25 MG tablet Commonly known as:  APRESOLINE Take 1 tablet (25 mg total) by mouth every 8 (eight) hours.   insulin aspart 100 UNIT/ML injection Commonly known as:  novoLOG CBG 70 - 120: 0 units CBG 121 - 150: 3 units CBG 151 - 200: 4 units CBG 201 - 250: 7 units CBG 251 - 300: 11 units CBG 301 - 350: 15 units CBG 351 - 400: 20 units   insulin glargine 100 UNIT/ML injection Commonly known as:  LANTUS Inject 0.25 mLs (25 Units total) into the skin daily. Start taking on:  02/12/2018   metoCLOPramide 5 MG tablet Commonly known as:  REGLAN Take 1 tablet (5 mg total) by mouth 3 (three) times daily.   multivitamin with minerals Tabs tablet Take 1 tablet by mouth daily.   pantoprazole 40 MG tablet Commonly known as:  PROTONIX Take 1  tablet (40 mg total) by mouth daily at 6 (six) AM. Start taking on:  02/12/2018   RESOURCE THICKENUP CLEAR Powd Use as recommended with every meal.   thiamine 100 MG tablet Take 1 tablet (100 mg total) by mouth daily.       Contact information for follow-up providers    [name] Rail, MD. Schedule an appointment as soon as possible for a visit in 1 week(s).   Specialty:  Internal Medicine Contact information: [place] [address] [phone]            Contact information for after-discharge care    Destination    [facility] .   Service:  Skilled Nursing Contact information: [address] [address] [phone]                 Allergies  Allergen Reactions  . Codeine Itching    Tolerates hydrocodone/apap    Consultations:  None.    Procedures/Studies: Dg Chest 2 View  Result Date: 02/09/2018 CLINICAL DATA:  Fever, aspiration pneumonia. EXAM: CHEST - 2 VIEW COMPARISON:  Portable chest x-ray of February 07, 2018 FINDINGS: The lungs remain mildly hyperinflated. There is a persistent infiltrate in the left lower lobe posterior-medially. There is no pleural effusion or pneumothorax. The heart and pulmonary vascularity are normal. The mediastinum is normal in width. The bony thorax exhibits no acute abnormality. IMPRESSION: Persistent left lower lobe pneumonia. Underlying COPD or reactive airway disease. Electronically Signed   By: David  Martinique M.D.   On: 02/09/2018 10:21   Dg Chest 2 View  Result Date: 02/03/2018 CLINICAL DATA:  Syncope EXAM: CHEST - 2 VIEW COMPARISON:  01/29/2018, 12/23/2017 FINDINGS: The heart size and mediastinal contours are within normal limits. Both lungs are clear. No pneumothorax. IMPRESSION: No active cardiopulmonary disease. Electronically Signed   By: Donavan Foil M.D.   On: 02/03/2018 20:03   Dg Chest 2 View  Result Date: 01/29/2018 CLINICAL DATA:  Cough.   Syncope.  Smoker. EXAM: CHEST - 2 VIEW COMPARISON:  12/23/2017 FINDINGS: The heart size and mediastinal contours are within normal limits. Both lungs are clear. Several old left rib fracture deformities are again noted. IMPRESSION: No active cardiopulmonary disease. Electronically Signed   By: Earle Gell M.D.   On: 01/29/2018 20:17   Dg Tibia/fibula Left  Result Date: 01/20/2018 CLINICAL DATA:  Left lower leg pain after fall down steps. EXAM: LEFT TIBIA AND FIBULA - 2 VIEW COMPARISON:  None. FINDINGS: There is no evidence of fracture or other focal bone lesions. Soft tissues are unremarkable. IMPRESSION: Negative. Electronically Signed   By: Marijo Conception, M.D.   On: 01/20/2018 15:02   Dg Ankle Complete Left  Result Date: 01/29/2018 CLINICAL DATA:  Left ankle pain. EXAM: LEFT ANKLE COMPLETE - 3+ VIEW COMPARISON:  01/20/2018 FINDINGS: There is no evidence of fracture, dislocation, or joint effusion. There is no evidence of arthropathy or other focal bone abnormality. Extensive peripheral vascular calcification noted. IMPRESSION: No acute findings. Electronically Signed   By: Earle Gell M.D.   On: 01/29/2018 20:19   Dg Ankle Complete Left  Result Date: 01/20/2018 CLINICAL DATA:  Left ankle pain after fall down steps. EXAM: LEFT ANKLE COMPLETE - 3+ VIEW COMPARISON:  None. FINDINGS: There is no evidence of fracture, dislocation, or joint effusion. There is no evidence of arthropathy or other focal bone abnormality. Soft tissues are unremarkable. IMPRESSION: Negative. Electronically Signed   By: Marijo Conception, M.D.   On: 01/20/2018 15:02   Ct Head Wo Contrast  Result Date: 02/03/2018 CLINICAL DATA:  Ct head/cspine wo, Pt has been walking in the rain today trying to get home after a dc from hospital, pt is cold to touch. Pt has abrasion to forehead, he fell face forwards onto cement. Pt has neck back and head pain 10/10. EXAM: CT HEAD WITHOUT CONTRAST CT CERVICAL SPINE WITHOUT CONTRAST TECHNIQUE:  Multidetector CT imaging of the head and cervical spine was performed following the standard protocol without intravenous contrast. Multiplanar CT image reconstructions of the cervical spine were also generated. COMPARISON:  CT of the head on 01/30/2018 FINDINGS: CT HEAD FINDINGS Brain: There is central and cortical atrophy. Periventricular white matter changes are consistent with small vessel disease. The basilar cisterns and ventricles have a normal appearance. There is no CT evidence for acute infarction or hemorrhage. A 4 millimeter calcified lesion is identified along the INNER table of the RIGHT  frontal bone, consistent with small meningioma. There is no associated edema or mass effect. The appearance is stable. Vascular: There is dense atherosclerotic calcification of the internal carotid arteries. Skull: Normal. Negative for fracture or focal lesion. Sinuses/Orbits: Soft tissue swelling, polyp, or mucous retention cyst within the LEFT maxillary sinus. Chronic deformity of the nasal bones consistent with remote fracture. Other: None CT CERVICAL SPINE FINDINGS Alignment: Normal. Skull base and vertebrae: No acute fracture. No primary bone lesion or focal pathologic process. Soft tissues and spinal canal: There is atherosclerotic calcification of the vertebral and carotid arteries. Spinal canal is unremarkable. Disc levels:  Disc height loss and uncovertebral spurring at C6-7. Upper chest: Negative. Other: None IMPRESSION: 1. Atrophy and small vessel disease. 2.  No evidence for acute intracranial abnormality. 3. Remote fractures of the nasal bones. 4. Stable 4 millimeter calcified meningioma RIGHT frontal region not associated with edema or mass effect. 5. Mid cervical degenerative changes without evidence for acute cervical spine fracture. Electronically Signed   By: Nolon Nations M.D.   On: 02/03/2018 20:29   Ct Head Wo Contrast  Result Date: 01/30/2018 CLINICAL DATA:  Syncope EXAM: CT HEAD WITHOUT  CONTRAST TECHNIQUE: Contiguous axial images were obtained from the base of the skull through the vertex without intravenous contrast. COMPARISON:  04/18/2017 FINDINGS: Brain: No acute territorial infarction, hemorrhage, or intracranial mass is visualized. Mild small vessel ischemic changes of the white matter. Mild atrophy. Stable ventricle size. Vascular: No hyperdense vessels. Vertebral artery and carotid vascular calcification Skull: Normal. Negative for fracture or focal lesion. Sinuses/Orbits: Chronic nasal bone deformity. Mild mucosal thickening in the sinuses. Mucous retention cyst in the left maxillary sinus. Other: None IMPRESSION: 1. No CT evidence for acute intracranial abnormality. 2. Atrophy and small vessel ischemic changes of the white matter Electronically Signed   By: Donavan Foil M.D.   On: 01/30/2018 02:13   Ct Cervical Spine Wo Contrast  Result Date: 02/03/2018 CLINICAL DATA:  Ct head/cspine wo, Pt has been walking in the rain today trying to get home after a dc from hospital, pt is cold to touch. Pt has abrasion to forehead, he fell face forwards onto cement. Pt has neck back and head pain 10/10. EXAM: CT HEAD WITHOUT CONTRAST CT CERVICAL SPINE WITHOUT CONTRAST TECHNIQUE: Multidetector CT imaging of the head and cervical spine was performed following the standard protocol without intravenous contrast. Multiplanar CT image reconstructions of the cervical spine were also generated. COMPARISON:  CT of the head on 01/30/2018 FINDINGS: CT HEAD FINDINGS Brain: There is central and cortical atrophy. Periventricular white matter changes are consistent with small vessel disease. The basilar cisterns and ventricles have a normal appearance. There is no CT evidence for acute infarction or hemorrhage. A 4 millimeter calcified lesion is identified along the INNER table of the RIGHT frontal bone, consistent with small meningioma. There is no associated edema or mass effect. The appearance is stable.  Vascular: There is dense atherosclerotic calcification of the internal carotid arteries. Skull: Normal. Negative for fracture or focal lesion. Sinuses/Orbits: Soft tissue swelling, polyp, or mucous retention cyst within the LEFT maxillary sinus. Chronic deformity of the nasal bones consistent with remote fracture. Other: None CT CERVICAL SPINE FINDINGS Alignment: Normal. Skull base and vertebrae: No acute fracture. No primary bone lesion or focal pathologic process. Soft tissues and spinal canal: There is atherosclerotic calcification of the vertebral and carotid arteries. Spinal canal is unremarkable. Disc levels:  Disc height loss and uncovertebral spurring at C6-7. Upper chest:  Negative. Other: None IMPRESSION: 1. Atrophy and small vessel disease. 2.  No evidence for acute intracranial abnormality. 3. Remote fractures of the nasal bones. 4. Stable 4 millimeter calcified meningioma RIGHT frontal region not associated with edema or mass effect. 5. Mid cervical degenerative changes without evidence for acute cervical spine fracture. Electronically Signed   By: Nolon Nations M.D.   On: 02/03/2018 20:29   Ct Abdomen Pelvis W Contrast  Result Date: 02/02/2018 CLINICAL DATA:  Fall down stairs, generalized pain. Diverticulitis suspected. EXAM: CT ABDOMEN AND PELVIS WITH CONTRAST TECHNIQUE: Multidetector CT imaging of the abdomen and pelvis was performed using the standard protocol following bolus administration of intravenous contrast. CONTRAST:  171m ISOVUE-300 IOPAMIDOL (ISOVUE-300) INJECTION 61% COMPARISON:  CT pelvis dated 02/23/2005. MRI abdomen dated 06/15/2004. FINDINGS: Lower chest: Lung bases are clear. Hepatobiliary: Stable mass within the LEFT liver lobe, compatible with benign hemangioma. No suspicious mass or lesion within the liver. No hepatic injury or perihepatic hematoma. Gallbladder appears normal. No bile duct dilatation. Pancreas: Unremarkable. No pancreatic ductal dilatation or surrounding  inflammatory changes. Spleen: No splenic injury or perisplenic hematoma. Adrenals/Urinary Tract: Adrenal glands are unremarkable. 3 mm nonobstructing RIGHT renal stone. Kidneys otherwise unremarkable without suspicious mass, stone or hydronephrosis. No perinephric hemorrhage. Bladder appears normal, moderately distended. Stomach/Bowel: No dilated large or small bowel loops. No evidence of bowel [place] inflammation or bowel Every injury. Fairly large amount of stool throughout the nondistended colon. Appendix is normal. Stomach is unremarkable, partially decompressed. Vascular/Lymphatic: Aortic atherosclerosis. No enlarged abdominal or pelvic lymph nodes. Reproductive: Prostate is unremarkable. Other: No free fluid or hemorrhage within the abdomen or pelvis. No free intraperitoneal air. Musculoskeletal: Mild degenerative spondylosis of the lumbar spine. No acute appearing osseous abnormality. No evidence of acute fracture or subluxation within the lumbar spine or osseous pelvis. IMPRESSION: 1. No acute findings within the abdomen or pelvis. No evidence of solid organ injury. No free fluid or hemorrhage. No bowel obstruction or evidence of bowel [place] inflammation. Appendix is normal. No osseous fracture or dislocation. 2. Fairly large amount of stool throughout the nondistended colon (constipation?). 3. Stable benign hemangioma within the LEFT liver lobe. 4.  Aortic Atherosclerosis (ICD10-I70.0). 5. 3 mm nonobstructing RIGHT renal stone. Electronically Signed   By: SFranki CabotM.D.   On: 02/02/2018 17:00   [place]al  Result Date: 02/04/2018 CLINICAL DATA:  62year old male with acute renal failure. EXAM: RENAL / URINARY TRACT ULTRASOUND COMPLETE COMPARISON:  CT Abdomen and Pelvis 02/02/2018 the. Abdomen ultrasound 09/30/2016. FINDINGS: Right Kidney: Length: 10.9 centimeters. Echogenicity at the upper limits of normal. No mass or hydronephrosis visualized. Right nephrolithiasis redemonstrated on image 20. Left  Kidney: Length: 10.5 centimeters. Echogenicity at the upper limits of normal. No mass or hydronephrosis visualized. Bladder: Posterior bladder diverticulum redemonstrated. No urinary debris identified. IMPRESSION: 1. No acute renal findings. 2. Right nephrolithiasis.  Bladder diverticulum. Electronically Signed   By: HGenevie AnnM.D.   On: 02/04/2018 16:07   Dg Chest Port 1 View  Result Date: 02/07/2018 CLINICAL DATA:  62year old male with nausea vomiting. Recent fall EXAM: PORTABLE CHEST 1 VIEW COMPARISON:  02/04/2018 and earlier. FINDINGS: Portable AP upright view at 0716 hours. Lung volumes appear normal. Allowing for portable technique the lungs are clear. Normal cardiac size and mediastinal contours. Visualized tracheal air column is within normal limits. No pneumothorax. Chronic left lateral 4th and 8th rib fractures are chronic and stable. No acute osseous abnormality identified. IMPRESSION: 1. No cardiopulmonary abnormality. 2. Chronic left  rib fractures. Electronically Signed   By: Genevie Ann M.D.   On: 02/07/2018 09:48   Dg Chest Port 1 View  Result Date: 02/04/2018 CLINICAL DATA:  Syncope.  Hyperglycemia. EXAM: PORTABLE CHEST 1 VIEW COMPARISON:  February 03, 2018 FINDINGS: There is no edema or consolidation. There is mild left base atelectasis. Heart is upper normal in size with pulmonary vascularity normal. No adenopathy. There is a healed fracture of the lateral left fourth rib. IMPRESSION: Left base atelectasis. No edema or consolidation. Heart upper normal in size. Electronically Signed   By: Lowella Grip III M.D.   On: 02/04/2018 12:41   Dg Knee Complete 4 Views Left  Result Date: 01/29/2018 CLINICAL DATA:  Left knee pain. EXAM: LEFT KNEE - COMPLETE 4+ VIEW COMPARISON:  None. FINDINGS: No evidence of fracture, dislocation, or joint effusion. No evidence of arthropathy or other focal bone abnormality. Extensive peripheral vascular calcification noted. IMPRESSION: No acute findings.  Electronically Signed   By: Earle Gell M.D.   On: 01/29/2018 20:18   Dg Abd Portable 1v  Result Date: 02/07/2018 CLINICAL DATA:  62 year old male with nausea vomiting.  Recent fall EXAM: PORTABLE ABDOMEN - 1 VIEW COMPARISON:  CT Abdomen and Pelvis 02/02/2018 FINDINGS: Portable AP supine view at 0716 hours. Non obstructed bowel gas pattern. Negative visible lung bases. Iliac artery calcified atherosclerosis. No acute osseous abnormality identified. IMPRESSION: Negative. Electronically Signed   By: Genevie Ann M.D.   On: 02/07/2018 09:46   Dg Swallowing Func-speech Pathology  Result Date: 02/10/2018 Objective Swallowing Evaluation: Type of Study: MBS-Modified Barium Swallow Study  Patient Details Name: MELROY BOUGHER MRN: [id number] Date of Birth: [birth year] Today's Date: 02/10/2018 Time: SLP Start Time (ACUTE ONLY): 1007 -SLP Stop Time (ACUTE ONLY): 1038 SLP Time Calculation (min) (ACUTE ONLY): 31 min Past Medical History: Past Medical History: Diagnosis Date . DEPRESSION  . DIABETES MELLITUS, TYPE I  . DRUG ABUSE   pt should have NO controlled substances rx'ed . Glaucoma  . HEPATITIS C   chronic . Hypertension 02/18/2011 . Proliferative diabetic retinopathy(362.02)  . Vitiligo  Past Surgical History: Past Surgical History: Procedure Laterality Date . ABDOMINAL AORTOGRAM W/LOWER EXTREMITY N/A 07/25/2016  Procedure: Abdominal Aortogram w/Bilateral Lower Extremity Runoff;  Surgeon: Conrad Maricao, MD;  Location: [facility];  Service: Cardiovascular;  Laterality: N/A; . ABDOMINAL AORTOGRAM W/LOWER EXTREMITY N/A 12/24/2016  Procedure: ABDOMINAL AORTOGRAM W/LOWER EXTREMITY;  Surgeon: Serafina Mitchell, MD;  Location: [facility];  Service: Cardiovascular;  Laterality: N/A; . AMPUTATION TOE Right 07/26/2016  Procedure: AMPUTATION GREAT TOE;  Surgeon: Serafina Mitchell, MD;  Location: [place] [facility];  Service: Vascular;  Laterality: Right; . EYE SURGERY    retinal surgery x 2, right eye . INSERTION OF ILIAC STENT Right  07/26/2016  Procedure: INSERTION OF RIGHT POPLITEAL STENT WITH BALLOON ANGIOPLASTY;  Surgeon: Serafina Mitchell, MD;  Location: [place];  Service: Vascular;  Laterality: Right; . LOWER EXTREMITY ANGIOGRAM Right 07/26/2016  Procedure: LOWER EXTREMITY ANGIOGRAM;  Surgeon: Serafina Mitchell, MD;  Location: [place];  Service: Vascular;  Laterality: Right; . PERIPHERAL VASCULAR BALLOON ANGIOPLASTY Right 07/26/2016  Procedure: PERIPHERAL VASCULAR BALLOON ANGIOPLASTY  RIGHT ANTERIOR TIBEAL ARTERY AND RIGHT SUPERFICIAL FEMORAL ARTERY;  Surgeon: Serafina Mitchell, MD;  Location: [place];  Service: Vascular;  Laterality: Right; . PERIPHERAL VASCULAR INTERVENTION Right 12/24/2016  Procedure: PERIPHERAL VASCULAR INTERVENTION;  Surgeon: Serafina Mitchell, MD;  Location: [facility];  Service: Cardiovascular;  Laterality: Right;  lower extr HPI: Pt is a 62 y/o male with history of medical noncompliance, polysubstance abuse, and diabetes.  Admitted 10/22 with hyperglycemia, diabetic ketoacidosis, dehydration and renal failure after a fall at home.  Initially was alert and oriented but became more encephalopathic overnight. Has had some episodes of vomiting.  Pt underwent BSE yesterday and was placed on dys1/thin diet.  Heard pt coughing from outside the room and RN reports he took pt's meal away due to aspiration concerns.  Follow up to assess tolerance and indication for instrumental evaluation.   Subjective: "I cough because I have COPD" Assessment / Plan / Recommendation CHL IP CLINICAL IMPRESSIONS 02/09/2018 Clinical Impression Patient presents with moderate sensorimotor oropharyngeal dysphagia - Oral control impaired with liquids mostly resulting in premature spillage of oral residuals to pyriform sinus with liquids.  Thin liquid pooling in pyriform sinus then spills into open airway with next swallow resulting in silent aspiration.  Dry swallows on command were difficult for pt to perform. Head turn left *pt's reported weak side* did  not improve airway protection or pharyngeal clearance.  Recommend pt solids be advanced to dys3/ground meats and modify to nectar thick liquids with free water between meals.  Pt will benefit from aggressive SlP to improve oral and pharyngeal strenghening as he states he is motivated.  Using live images/flouro loops educated pt to findings and reinforced effective strategies.   SLP Visit Diagnosis Dysphagia, oropharyngeal phase (R13.12) Attention and concentration deficit following -- Frontal lobe and executive function deficit following -- Impact on safety and function Moderate aspiration risk   CHL IP TREATMENT RECOMMENDATION 02/09/2018 Treatment Recommendations Therapy as outlined in treatment plan below   Prognosis 02/09/2018 Prognosis for Safe Diet Advancement Fair Barriers to Reach Goals Time post onset Barriers/Prognosis Comment -- CHL IP DIET RECOMMENDATION 02/09/2018 SLP Diet Recommendations Dysphagia 3 (Mech soft) solids;Nectar thick liquid;Other (Comment);Free water protocol after oral care Liquid Administration via Cup;No straw Medication Administration Whole meds with puree Compensations Slow rate;Small sips/bites;Follow solids with liquid;Effortful swallow Postural Changes Remain semi-upright after after feeds/meals (Comment);Seated upright at 90 degrees   CHL IP OTHER RECOMMENDATIONS 02/09/2018 Recommended Consults -- Oral Care Recommendations Oral care BID Other Recommendations --   CHL IP FOLLOW UP RECOMMENDATIONS 02/09/2018 Follow up Recommendations Home health SLP;Skilled Nursing facility   [facility] IP FREQUENCY AND DURATION 02/09/2018 Speech Therapy Frequency (ACUTE ONLY) min 2x/week Treatment Duration 2 weeks      CHL IP ORAL PHASE 02/09/2018 Oral Phase Impaired Oral - Pudding Teaspoon -- Oral - Pudding Cup -- Oral - Honey Teaspoon -- Oral - Honey Cup -- Oral - Nectar Teaspoon -- Oral - Nectar Cup Decreased bolus cohesion;Premature spillage Oral - Nectar Straw -- Oral - Thin Teaspoon Decreased bolus  cohesion;Reduced posterior propulsion Oral - Thin Cup Decreased bolus cohesion;Lingual/palatal residue;Reduced posterior propulsion;Premature spillage Oral - Thin Straw Decreased bolus cohesion;Lingual/palatal residue;Reduced posterior propulsion;Premature spillage Oral - Puree Reduced posterior propulsion;Premature spillage Oral - Mech Soft -- Oral - Regular Decreased bolus cohesion;Reduced posterior propulsion;Premature spillage Oral - Multi-Consistency Reduced posterior propulsion;Lingual/palatal residue;Decreased bolus cohesion;Premature spillage Oral - Pill -- Oral Phase - Comment --  CHL IP PHARYNGEAL PHASE 02/09/2018 Pharyngeal Phase Impaired Pharyngeal- Pudding Teaspoon -- Pharyngeal -- Pharyngeal- Pudding Cup -- Pharyngeal -- Pharyngeal- Honey Teaspoon -- Pharyngeal -- Pharyngeal- Honey Cup -- Pharyngeal -- Pharyngeal- Nectar Teaspoon -- Pharyngeal -- Pharyngeal- Nectar Cup Reduced epiglottic inversion Pharyngeal -- Pharyngeal- Nectar Straw -- Pharyngeal -- Pharyngeal- Thin Teaspoon Delayed swallow initiation-pyriform sinuses Pharyngeal -- Pharyngeal- Thin Cup Delayed  swallow initiation-pyriform sinuses;Reduced epiglottic inversion;Penetration/Aspiration during swallow;Trace aspiration;Penetration/Aspiration before swallow Pharyngeal Material enters airway, passes BELOW cords without attempt by patient to eject out (silent aspiration) Pharyngeal- Thin Straw Delayed swallow initiation-pyriform sinuses;Trace aspiration;Penetration/Aspiration during swallow Pharyngeal Material enters airway, passes BELOW cords without attempt by patient to eject out (silent aspiration) Pharyngeal- Puree Reduced epiglottic inversion Pharyngeal -- Pharyngeal- Mechanical Soft -- Pharyngeal -- Pharyngeal- Regular Reduced epiglottic inversion Pharyngeal -- Pharyngeal- Multi-consistency Reduced epiglottic inversion;Penetration/Aspiration during swallow;Trace aspiration;Delayed swallow initiation-pyriform sinuses Pharyngeal Material  enters airway, passes BELOW cords without attempt by patient to eject out (silent aspiration);Material enters airway, passes BELOW cords and not ejected out despite cough attempt by patient Pharyngeal- Pill -- Pharyngeal -- Pharyngeal Comment pt with aspiration of oral residuals of thin spilling from pyriform sinus into larynx/trachea with swallowing, small bolus with cognitive readiness "1,2,3" to swallow entire bolus helpful,  chin tuck posture worsened aspiration as it dumped residuals into larynx/trachea, cued "hock" did not clear residuals but was strong and rec pt continue this strategie  CHL IP CERVICAL ESOPHAGEAL PHASE 02/09/2018 Cervical Esophageal Phase WFL Pudding Teaspoon -- Pudding Cup -- Honey Teaspoon -- Honey Cup -- Nectar Teaspoon -- Nectar Cup -- Nectar Straw -- Thin Teaspoon -- Thin Cup -- Thin Straw -- Puree -- Mechanical Soft -- Regular -- Multi-consistency -- Pill -- Cervical Esophageal Comment -- [name] 02/10/2018, 7:47 AM  Luanna Salk, MS [facility] SLP Acute Rehab Services Pager [phone] Office [phone]             Vas US Carotid  Result Date: 01/30/2018 Carotid Arterial Duplex Study Indications: Syncope. Performing Technologist: Abram Sander  Examination Guidelines: A complete evaluation includes B-mode imaging, spectral Doppler, color Doppler, and power Doppler as needed of all accessible portions of each vessel. Bilateral testing is considered an integral part of a complete examination. Limited examinations for reoccurring indications may be performed as noted.  Right Carotid Findings: +----------+--------+--------+--------+-----------+--------+           PSV cm/sEDV cm/sStenosisDescribe   Comments +----------+--------+--------+--------+-----------+--------+ CCA Prox  89      7               homogeneous         +----------+--------+--------+--------+-----------+--------+ CCA Distal58      0               homogeneous          +----------+--------+--------+--------+-----------+--------+ ICA Prox  215     40      40-59%  homogeneous         +----------+--------+--------+--------+-----------+--------+ ICA Mid   223     33                                  +----------+--------+--------+--------+-----------+--------+ ICA Distal88      15                                  +----------+--------+--------+--------+-----------+--------+ ECA       192     35                                  +----------+--------+--------+--------+-----------+--------+ +----------+--------+-------+--------+-------------------+           PSV cm/sEDV cmsDescribeArm Pressure (mmHG) +----------+--------+-------+--------+-------------------+ [id number]                                        +----------+--------+-------+--------+-------------------+ +---------+--------+--+--------+-+---------+  VertebralPSV cm/s50EDV cm/s8Antegrade +---------+--------+--+--------+-+---------+  Left Carotid Findings: +----------+--------+--------+--------+-----------+--------+           PSV cm/sEDV cm/sStenosisDescribe   Comments +----------+--------+--------+--------+-----------+--------+ CCA Prox  101     9               homogeneous         +----------+--------+--------+--------+-----------+--------+ CCA Distal70      12              homogeneous         +----------+--------+--------+--------+-----------+--------+ ICA Prox  78      16      1-39%   homogeneous         +----------+--------+--------+--------+-----------+--------+ ICA Distal88      24                                  +----------+--------+--------+--------+-----------+--------+ ECA       125                                         +----------+--------+--------+--------+-----------+--------+ +----------+--------+--------+--------+-------------------+ SubclavianPSV cm/sEDV cm/sDescribeArm Pressure (mmHG)  +----------+--------+--------+--------+-------------------+           65                                          +----------+--------+--------+--------+-------------------+ +---------+--------+--+--------+--+---------+ VertebralPSV cm/s44EDV cm/s11Antegrade +---------+--------+--+--------+--+---------+  Summary: Right Carotid: Velocities in the right ICA are consistent with a 40-59%                stenosis. Left Carotid: Velocities in the left ICA are consistent with a 1-39% stenosis. Vertebrals: Bilateral vertebral arteries demonstrate antegrade flow. *See table(s) above for measurements and observations.  Electronically signed by Harold Barban MD on 01/30/2018 at 1:25:20 PM.    Final       Subjective: No chest pain or sob. Requesting for gabapentin .   Discharge Exam: Vitals:   02/11/18 0357 02/11/18 0802  BP: (!) 185/84 (!) 171/78  Pulse: 86 85  Resp: 20 20  Temp: 97.9 F (36.6 C) 98.6 F (37 C)  SpO2: 99% 100%   Vitals:   02/10/18 2006 02/11/18 0148 02/11/18 0357 02/11/18 0802  BP: (!) 166/59 (!) 148/51 (!) 185/84 (!) 171/78  Pulse: 91 87 86 85  Resp: '18 18 20 20  '$ Temp: 100.1 F (37.8 C)  97.9 F (36.6 C) 98.6 F (37 C)  TempSrc: Oral  Oral Oral  SpO2: 93% 95% 99% 100%  Weight:      Height:        General: Pt is alert, awake, not in acute distress Cardiovascular: RRR, S1/S2 +, no rubs, no gallops Respiratory: CTA bilaterally, no wheezing, no rhonchi Abdominal: Soft, NT, ND, bowel sounds + Extremities: no edema, no cyanosis    The results of significant diagnostics from this hospitalization (including imaging, microbiology, ancillary and laboratory) are listed below for reference.     Microbiology: Recent Results (from the past 240 hour(s))  Culture, blood (routine x 2)     Status: None   Collection Time: 02/04/18  1:15 AM  Result Value Ref Range Status   Specimen Description BLOOD RIGHT HAND  Final   Special Requests   Final    BOTTLES DRAWN  AEROBIC AND  ANAEROBIC Blood Culture results may not be optimal due to an inadequate volume of blood received in culture bottles   Culture   Final    NO GROWTH 5 DAYS Performed at [facility] [address], [place], [place] [address]    Report Status 02/09/2018 FINAL  Final  Culture, blood (routine x 2)     Status: None   Collection Time: 02/04/18  1:27 AM  Result Value Ref Range Status   Specimen Description BLOOD LEFT HAND  Final   Special Requests   Final    BOTTLES DRAWN AEROBIC AND ANAEROBIC Blood Culture results may not be optimal due to an inadequate volume of blood received in culture bottles   Culture   Final    NO GROWTH 5 DAYS Performed at [facility], [place] [address] [place], [place] [address]    Report Status 02/09/2018 FINAL  Final  Urine Culture     Status: Abnormal   Collection Time: 02/04/18 11:05 AM  Result Value Ref Range Status   Specimen Description URINE, CLEAN CATCH  Final   Special Requests   Final    NONE Performed at [facility], [place] [address], [place], [place] [address]    Culture 10,000 COLONIES/mL STAPHYLOCOCCUS HAEMOLYTICUS (A)  Final   Report Status 02/06/2018 FINAL  Final   Organism ID, Bacteria STAPHYLOCOCCUS HAEMOLYTICUS (A)  Final      Susceptibility   Staphylococcus haemolyticus - MIC*    CIPROFLOXACIN >=8 RESISTANT Resistant     GENTAMICIN >=16 RESISTANT Resistant     NITROFURANTOIN 32 SENSITIVE Sensitive     OXACILLIN >=4 RESISTANT Resistant     TETRACYCLINE <=1 SENSITIVE Sensitive     VANCOMYCIN 1 SENSITIVE Sensitive     TRIMETH/SULFA >=320 RESISTANT Resistant     CLINDAMYCIN >=8 RESISTANT Resistant     RIFAMPIN <=0.5 SENSITIVE Sensitive     Inducible Clindamycin NEGATIVE Sensitive     * 10,000 COLONIES/mL STAPHYLOCOCCUS HAEMOLYTICUS  MRSA PCR Screening     Status: None   Collection Time: 02/04/18  1:28 PM  Result Value Ref Range Status   MRSA by PCR NEGATIVE NEGATIVE Final    Comment:        The GeneXpert  MRSA Assay (FDA approved for NASAL specimens only), is one component of a comprehensive MRSA colonization surveillance program. It is not intended to diagnose MRSA infection nor to guide or monitor treatment for MRSA infections. Performed at [facility], [place] [address] [place], [place] [address]   Culture, blood (Routine X 2) w Reflex to ID Panel     Status: None (Preliminary result)   Collection Time: 02/08/18  5:06 PM  Result Value Ref Range Status   Specimen Description BLOOD BLOOD LEFT HAND  Final   Special Requests   Final    BOTTLES DRAWN AEROBIC ONLY Blood Culture results may not be optimal due to an inadequate volume of blood received in culture bottles   Culture   Final    NO GROWTH 2 DAYS Performed at [facility], [place] [address], [place],  [address]    Report Status PENDING  Incomplete  Culture, blood (Routine X 2) w Reflex to ID Panel     Status: None (Preliminary result)   Collection Time: 02/08/18  5:06 PM  Result Value Ref Range Status   Specimen Description BLOOD BLOOD RIGHT HAND  Final   Special Requests   Final    BOTTLES DRAWN  AEROBIC ONLY Blood Culture results may not be optimal due to an inadequate volume of blood received in culture bottles   Culture   Final    NO GROWTH 2 DAYS Performed at [facility] [address] [place], [place] [address]    Report Status PENDING  Incomplete     Labs: BNP (last 3 results) No results for input(s): BNP in the last 8760 hours. Basic Metabolic Panel: Recent Labs  Lab 02/06/18 0324 02/07/18 0634 02/07/18 1221 02/08/18 1706 02/09/18 0707 02/10/18 0329  NA 145 139  --  138 136 138  K 3.7 3.0*  --  4.0 3.9 4.4  CL 110 105  --  101 102 102  CO2 27 24  --  '23 27 29  '$ GLUCOSE 125* 259*  --  228* 348* 233*  BUN 14 10  --  25* 26* 20  CREATININE 0.97 1.15  --  1.57* 1.44* 1.29*  CALCIUM 8.9 8.5*  --  8.2* 8.0* 8.0*  MG 2.0  --  1.8  --   --   --   PHOS  --   --  1.8*  --  2.5  --     Liver Function Tests: No results for input(s): AST, ALT, ALKPHOS, BILITOT, PROT, ALBUMIN in the last 168 hours. No results for input(s): LIPASE, AMYLASE in the last 168 hours. No results for input(s): AMMONIA in the last 168 hours. CBC: Recent Labs  Lab 02/05/18 0322 02/07/18 0634 02/09/18 0707 02/10/18 0329  WBC 15.5* 14.7* 11.5* 9.4  HGB 10.5* 11.3* 9.9* 8.6*  HCT 31.9* 35.2* 30.2* 27.7*  MCV 91.7 90.7 92.1 92.6  PLT 305 272 256 267   Cardiac Enzymes: Recent Labs  Lab 02/04/18 1527 02/04/18 1953  TROPONINI 2.16* 1.93*   BNP: Invalid input(s): POCBNP CBG: Recent Labs  Lab 02/10/18 1335 02/10/18 1458 02/10/18 1634 02/10/18 2117 02/11/18 0925  GLUCAP 53* 99 71 178* 258*   D-Dimer No results for input(s): DDIMER in the last 72 hours. Hgb A1c No results for input(s): HGBA1C in the last 72 hours. Lipid Profile No results for input(s): CHOL, HDL, LDLCALC, TRIG, CHOLHDL, LDLDIRECT in the last 72 hours. Thyroid function studies No results for input(s): TSH, T4TOTAL, T3FREE, THYROIDAB in the last 72 hours.  Invalid input(s): FREET3 Anemia work up No results for input(s): VITAMINB12, FOLATE, FERRITIN, TIBC, IRON, RETICCTPCT in the last 72 hours. Urinalysis    Component Value Date/Time   COLORURINE YELLOW 02/03/2018 1917   APPEARANCEUR CLEAR 02/03/2018 1917   LABSPEC 1.020 02/03/2018 1917   PHURINE 5.0 02/03/2018 1917   GLUCOSEU >=500 (A) 02/03/2018 1917   GLUCOSEU 500 04/28/2012 1149   HGBUR SMALL (A) 02/03/2018 [place] NEGATIVE 02/03/2018 1917   BILIRUBINUR neg 04/28/2012 1053   KETONESUR 20 (A) 02/03/2018 1917   PROTEINUR 100 (A) 02/03/2018 1917   UROBILINOGEN 4.0 (H) 11/16/2013 1646   NITRITE NEGATIVE 02/03/2018 1917   LEUKOCYTESUR NEGATIVE 02/03/2018 1917   Sepsis Labs Invalid input(s): PROCALCITONIN,  WBC,  LACTICIDVEN Microbiology Recent Results (from the past 240 hour(s))  Culture, blood (routine x 2)     Status: None   Collection  Time: 02/04/18  1:15 AM  Result Value Ref Range Status   Specimen Description BLOOD RIGHT HAND  Final   Special Requests   Final    BOTTLES DRAWN AEROBIC AND ANAEROBIC Blood Culture results may not be optimal due to an inadequate volume of blood received in culture bottles   Culture  Final    NO GROWTH 5 DAYS Performed at [facility], [place] [address], [place], [place] [address]    Report Status 02/09/2018 FINAL  Final  Culture, blood (routine x 2)     Status: None   Collection Time: 02/04/18  1:27 AM  Result Value Ref Range Status   Specimen Description BLOOD LEFT HAND  Final   Special Requests   Final    BOTTLES DRAWN AEROBIC AND ANAEROBIC Blood Culture results may not be optimal due to an inadequate volume of blood received in culture bottles   Culture   Final    NO GROWTH 5 DAYS Performed at [facility], [place] [address], [place] [address]    Report Status 02/09/2018 FINAL  Final  Urine Culture     Status: Abnormal   Collection Time: 02/04/18 11:05 AM  Result Value Ref Range Status   Specimen Description URINE, CLEAN CATCH  Final   Special Requests   Final    NONE Performed at [facility], [place] [address], [place], [place] [address]    Culture 10,000 COLONIES/mL STAPHYLOCOCCUS HAEMOLYTICUS (A)  Final   Report Status 02/06/2018 FINAL  Final   Organism ID, Bacteria STAPHYLOCOCCUS HAEMOLYTICUS (A)  Final      Susceptibility   Staphylococcus haemolyticus - MIC*    CIPROFLOXACIN >=8 RESISTANT Resistant     GENTAMICIN >=16 RESISTANT Resistant     NITROFURANTOIN 32 SENSITIVE Sensitive     OXACILLIN >=4 RESISTANT Resistant     TETRACYCLINE <=1 SENSITIVE Sensitive     VANCOMYCIN 1 SENSITIVE Sensitive     TRIMETH/SULFA >=320 RESISTANT Resistant     CLINDAMYCIN >=8 RESISTANT Resistant     RIFAMPIN <=0.5 SENSITIVE Sensitive     Inducible Clindamycin NEGATIVE Sensitive     * 10,000 COLONIES/mL STAPHYLOCOCCUS HAEMOLYTICUS  MRSA PCR Screening     Status:  None   Collection Time: 02/04/18  1:28 PM  Result Value Ref Range Status   MRSA by PCR NEGATIVE NEGATIVE Final    Comment:        The GeneXpert MRSA Assay (FDA approved for NASAL specimens only), is one component of a comprehensive MRSA colonization surveillance program. It is not intended to diagnose MRSA infection nor to guide or monitor treatment for MRSA infections. Performed at [facility], [place] [address], [place], [place] [address]   Culture, blood (Routine X 2) w Reflex to ID Panel     Status: None (Preliminary result)   Collection Time: 02/08/18  5:06 PM  Result Value Ref Range Status   Specimen Description BLOOD BLOOD LEFT HAND  Final   Special Requests   Final    BOTTLES DRAWN AEROBIC ONLY Blood Culture results may not be optimal due to an inadequate volume of blood received in culture bottles   Culture   Final    NO GROWTH 2 DAYS Performed at [facility], [place] [address], [place], [place] [address]    Report Status PENDING  Incomplete  Culture, blood (Routine X 2) w Reflex to ID Panel     Status: None (Preliminary result)   Collection Time: 02/08/18  5:06 PM  Result Value Ref Range Status   Specimen Description BLOOD BLOOD RIGHT HAND  Final   Special Requests   Final    BOTTLES DRAWN AEROBIC ONLY Blood Culture results may not be optimal due to an inadequate volume of blood received in culture bottles   Culture  Final    NO GROWTH 2 DAYS Performed at [facility], [place] [address], [place], [place] [address]    Report Status PENDING  Incomplete     Time coordinating discharge: 32 minutes  SIGNED:   Hosie Poisson, MD  [name] 02/11/2018, 11:04 AM Pager   If 7PM-7AM, please contact night-coverage [email or web address] Password [email or web address]

## 2018-02-11 NOTE — Progress Notes (Signed)
Physical Therapy Treatment Patient Details Name: [name] MRN: [id number] DOB: 06-09-1955 Today's Date: 02/11/2018    History of Present Illness Pt is a 62 y/o male with history of medical noncompliance, polysubstance abuse, and diabetes.  Admitted 10/22 with hyperglycemia, diabetic ketoacidosis, dehydration and renal failure after a fall at home.  Initially was alert and oriented but became more encephalopathic overnight. Troponins were elevated and Cardiology was consulted and contributed that to demand ischemia.     PT Comments    Pt performed gait training with significant assistance due to poor B UE strength and poor strength in L leg.  Pt reports L leg buckling but no buckling noted.  Pt appropriate during session and followed commands well.  He continues to require SNF placement to improve strength and function before returning to community.     Follow Up Recommendations  SNF;Supervision/Assistance - 24 hour     Equipment Recommendations  None recommended by PT    Recommendations for Other Services       Precautions / Restrictions Precautions Precautions: Fall Precaution Comments: impaired postural control in sitting, hands on for sitting balance Restrictions Weight Bearing Restrictions: No    Mobility  Bed Mobility Overal bed mobility: Needs Assistance Bed Mobility: Supine to Sit Rolling: Supervision   Supine to sit: Min guard     General bed mobility comments: supervision to roll with use of bed rails, min guard to come to sitting with heavy use of bed railings with mod multimodal cues for sequencing and use of rails for assist rather than pulling on therapist.    Transfers Overall transfer level: Needs assistance Equipment used: Rolling walker (2 wheeled) Transfers: Squat Pivot Transfers Sit to Stand: Mod assist         General transfer comment: Pt required cues for hand placement to and from seated surface.  Significant assistance needed to boost into  standing from bed and from commode.    Ambulation/Gait Ambulation/Gait assistance: Supervision Gait Distance (Feet): 10 Feet(+ 5 feet from toilet to sink.  ) Assistive device: Rolling walker (2 wheeled) Gait Pattern/deviations: Step-through pattern;Decreased stride length;Decreased weight shift to left;Shuffle;Trunk flexed Gait velocity: Decreased   General Gait Details: Cues for upper trunk control, hip extension and RW safety with turning and backing.     Stairs             Wheelchair Mobility    Modified Rankin (Stroke Patients Only)       Balance Overall balance assessment: Needs assistance Sitting-balance support: Bilateral upper extremity supported;Feet supported Sitting balance-Leahy Scale: Poor Sitting balance - Comments: Pt with poor trunk control sitting EOB, requires BUE support and min assist to maintain static sitting balance EOB     Standing balance-Leahy Scale: Fair                              Cognition Arousal/Alertness: Awake/alert Behavior During Therapy: Restless;Anxious Overall Cognitive Status: Within Functional Limits for tasks assessed                                 General Comments: tangential, manic      Exercises General Exercises - Lower Extremity Long Arc Quad: AROM;Both;10 reps;Seated Hip Flexion/Marching: AROM;Both;10 reps;Seated    General Comments        Pertinent Vitals/Pain Pain Assessment: 0-10 Faces Pain Scale: Hurts even more Pain Location: reports B feet  and L buttock.   Pain Descriptors / Indicators: Aching Pain Intervention(s): Monitored during session;Repositioned    Home Living                      Prior Function            PT Goals (current goals can now be found in the care plan section) Acute Rehab PT Goals Patient Stated Goal: to be independent again Potential to Achieve Goals: Good Progress towards PT goals: Progressing toward goals    Frequency    Min  3X/week      PT Plan Current plan remains appropriate    Co-evaluation              AM-PAC PT "6 Clicks" Daily Activity  Outcome Measure  Difficulty turning over in bed (including adjusting bedclothes, sheets and blankets)?: None Difficulty moving from lying on back to sitting on the side of the bed? : Unable Difficulty sitting down on and standing up from a chair with arms (e.g., wheelchair, bedside commode, etc,.)?: Unable Help needed moving to and from a bed to chair (including a wheelchair)?: A Lot Help needed walking in hospital room?: Total Help needed climbing 3-5 steps with a railing? : Total 6 Click Score: 10    End of Session Equipment Utilized During Treatment: Gait belt Activity Tolerance: Patient tolerated treatment well Patient left: in chair;with call bell/phone within reach;with chair alarm set Nurse Communication: Mobility status PT Visit Diagnosis: Unsteadiness on feet (R26.81);Other abnormalities of gait and mobility (R26.89);Muscle weakness (generalized) (M62.81);History of falling (Z91.81)     Time: 9675-9163 PT Time Calculation (min) (ACUTE ONLY): 27 min  Charges:  $Gait Training: 8-22 mins $Therapeutic Activity: 8-22 mins                     Governor Rooks, PTA Acute Rehabilitation Services Pager [phone] Office [phone]     Marit Goodwill Eli Hose 02/11/2018, 5:46 PM

## 2018-02-11 NOTE — Progress Notes (Signed)
  Speech Language Pathology Treatment: Dysphagia  Patient Details Name: [name] MRN: [id number] DOB: 02-17-1956 Today's Date: 02/11/2018 Time: 3545-6256 SLP Time Calculation (min) (ACUTE ONLY): 20 min  Assessment / Plan / Recommendation Clinical Impression  Pt today seen to assess po tolerance and reinforce helpful compensation strategies.  Pt today sitting upright in bed eating breakfast.  He was appropriate with this SLP.    Observed pt with intake of solids *vanilla wafers* nectar juice and magic cup.  Pt was observed to cough on occasion during intake - delayed and productive to minimal thin watery secretions.  Overt coughing with vanilla wafers - each swallow - suspect premature spillage of cracker crumbs into open airway. Pt with very poor dentition and demonstrates "munching mastication at anterior oral cavity with solids.  Much improved tolerance of puree/nectar. Pt clearly is demonstrating increased caution with po intake which is encouraging - taking liquids via tsp with only Mod I cues.  He verbalized clinical reasoning for assuring "hocking ability" is intact to clear pharynx of residuals/secretions.   Pt clearly indicates he desires to eat despite multifactorial dysphagia and possible/probable aspiration.  Given he is afebrile and most recent cxr negative, he appears to be tolerating po intake.    To honor pt's wishes with aspiration mitigation, recommend advance diet to dys2/nectar with strict precautions.  e will be chronic aspiration risk.  Spoke to RN/MD regarding recommendations.   SlP to follow up to start pt on respiratory muscle strength training to help him maximize airway clearance with po intake.  Thanks.      HPI HPI: Pt is a 62 y/o male with history of medical noncompliance, polysubstance abuse, and diabetes.  Admitted 10/22 with hyperglycemia, diabetic ketoacidosis, dehydration and renal failure after a fall at home.  Initially was alert and oriented but became more  encephalopathic overnight. Has had some episodes of vomiting.  Pt underwent BSE yesterday and was placed on dys1/thin diet.  Heard pt coughing from outside the room and RN reports he took pt's meal away due to aspiration concerns.  Follow up to assess tolerance however pt was made npo due to continued coughing with po intake.        SLP Plan  Continue with current plan of care       Recommendations  Diet recommendations: Nectar-thick liquid;Dysphagia 1 (puree)(pending GOC meeting) Liquids provided via: Teaspoon;No straw(tsp only) Supervision: Full supervision/cueing for compensatory strategies(absolute full supervision) Compensations: Slow rate;Effortful swallow Postural Changes and/or Swallow Maneuvers: Seated upright 90 degrees;Upright 30-60 min after meal                Oral Care Recommendations: Oral care BID Follow up Recommendations: (tbd) SLP Visit Diagnosis: Dysphagia, oropharyngeal phase (R13.12) Plan: Continue with current plan of care       GO                [name] 02/11/2018, 8:40 AM Luanna Salk, MS [facility] SLP Acute Rehab Services Pager [phone] Office [phone]

## 2018-02-11 NOTE — Progress Notes (Signed)
Pt making inappropriate sexual comments to night RN, which have been escalating in nature and advancing.  Cloey Sferrazza RN took second Chief of Staff) into room as witness and the patient continued to make comments again. Patient was stating, "I'm lonely and I want you to lay down and keep me company."  "I want you to bath me..."  "I don't know how to urinate can you show me."  RN approached A/C about locating a male tech to provide patient with self care.  I went back to the room to give patient his scheduled dose of Reglan and while I was pushing the medication into the iv line, the patient grabbed my crotch with his hand.  I immediately stood back and told him that was totally inappropriate and would not be tolerated, to which the patient responds, "dont women like to be touched by men?"  RN left room and notified CN and AC of incident.

## 2018-02-11 NOTE — Progress Notes (Signed)
Pt's RN Wende Crease came and told me about pt's behavior and inappropriate advance towards her, This RN went to speak with the pt and was informed that sexual advances towards any staff is not acceptable and that is considered harrassment. Pt was made to understand this behavior will not be tolerated. The [facility] [place] was made aware. Pt called up to the desk after I spoke him and tried to apologize for his behavior and asking for a [place] with mayonnaise but he was told he cannot have that due to aspiration precaution. Pt's NT Nickie went to take his vital signs and reported that pt was still making sexual advances and trying to touch her inappropriately. NP K. Schorr (on call) was paged and notified of events and security called. Staff was advised to always go into the room in pairs until morning, will however continue to monitor. Obasogie-Asidi, Laurian Edrington Efe

## 2018-02-11 NOTE — Progress Notes (Deleted)
Pt making inappropriate sexual comments to night RN, which have been escalating in nature and advancing.  Noretta Frier RN took second Chief of Staff) into room as witness and the patient continued to make comments again. Patient was stating, "I'm lonely and I want you to lay down and keep me company."  "I want you to bath me..."  "I don't know how to urinate can you show me."  RN approached A/C about locating a male tech to provide patient with self care.  I went back to the room to give patient his scheduled dose of Reglan and while I was pushing the medication into the iv line, the patient grabbed my crotch with his hand.  I immediately stood back and told him that was totally inappropriate and would not be tolerated, to which the patient responds, "dont women like to be touched by men?"  RN left room and notified CN and AC of incident.

## 2018-02-11 NOTE — NC FL2 (Signed)
[place] LEVEL OF CARE SCREENING TOOL     IDENTIFICATION  Patient Name: [name] Birthdate: 08-22-1955 Sex: male Admission Date (Current Location): 02/03/2018  [facility] and [place] Number:  Herbalist and Address:  [facility] [place], [place] [address] [place], [place] [address]      Provider Number: [id number]  Attending Physician Name and Address:  Hosie Poisson, MD  Relative Name and Phone Number:       Current Level of Care: Hospital Recommended Level of Care: [place] Prior Approval Number:    Date Approved/Denied:   PASRR Number: [id number]  Discharge Plan: SNF    Current Diagnoses: Patient Active Problem List   Diagnosis Date Noted  . Abrasions of multiple sites   . Demand ischemia of myocardium ([place]) 02/05/2018  . Acute metabolic encephalopathy 78/46/9629  . DKA (diabetic ketoacidoses) ([place]) 02/03/2018  . Leukocytosis 02/03/2018  . Hypoglycemia 12/24/2017  . Syncope 12/24/2017  . Syncope and collapse 12/23/2017  . IV drug abuse ([place]) 04/19/2017  . Acute encephalopathy 04/19/2017  . Hemangioma 10/02/2016  . Foreign body (FB) in soft tissue   . 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[place])   . Gangrene ([place])   . Type 1 diabetes mellitus with hyperosmolarity without nonketotic hyperglycemic hyperosmolar coma ([place])   . Tobacco abuse   . Essential hypertension   . Diabetic infection of right foot ([place]) 07/22/2016  . Diabetic foot infection ([place]) 07/22/2016  . Chronic ulcer of heel, right, with fat layer exposed ([place]) 05/20/2016  . DKA, type 1 ([place]) 04/18/2016  . Acute kidney injury ([place]) 04/18/2016  . Hyponatremia 04/18/2016  . Severe protein-calorie malnutrition ([place]) 04/18/2016  . Elevated troponin 04/18/2016  . Poor dentition 07/12/2015  . Tobacco use 07/06/2013  . PAD (peripheral artery disease) ([employer]) 04/29/2013  . COPD, mild ([place])   . 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 Diabetic neuropathy ([place]) 06/21/2010  . PROLIFERATIVE DIABETIC RETINOPATHY 06/21/2010  . SKIN TAG 01/30/2010  . Chronic hepatitis C ([place]) 11/22/2008  . VITILIGO 11/22/2008  . PROTEINURIA, MILD 11/22/2008  . Substance abuse ([place]) 04/23/2007  . DEPRESSION 11/11/2006    Orientation RESPIRATION BLADDER Height & Weight     Self, Time, Situation, Place  Normal Continent Weight: 149 lb 0.5 oz (67.6 kg) Height:  6' (182.9 cm)  BEHAVIORAL SYMPTOMS/MOOD NEUROLOGICAL BOWEL NUTRITION STATUS      Continent Diet(see DC summary)  AMBULATORY STATUS COMMUNICATION OF NEEDS Skin   Extensive Assist Verbally Skin abrasions                       Personal Care Assistance Level of Assistance  Bathing, Feeding, Dressing Bathing Assistance: Maximum assistance Feeding assistance: Limited assistance Dressing Assistance: Maximum assistance     Functional Limitations Info  Sight, Hearing, Speech Sight Info: Adequate Hearing Info: Adequate Speech Info: Adequate    SPECIAL CARE FACTORS FREQUENCY  PT (By licensed PT), OT (By licensed OT), Speech therapy     PT Frequency: 5x/wk OT Frequency: 5x/wk     Speech Therapy Frequency: 5x/wk      Contractures Contractures Info: Not present    Additional Factors Info  Code Status, Allergies, Insulin Sliding Scale Code Status Info: Full Allergies Info: Codeine   Insulin Sliding Scale Info: Novolog 3x/day with meals and at bedtime; Lantus 20 units daily       Current Medications (02/11/2018):  This is the  current hospital active medication list Current Facility-Administered Medications  Medication Dose Route Frequency Provider Last Rate Last Dose  . 0.9 %  sodium chloride infusion   Intravenous PRN Simonne Maffucci B, MD 10 mL/hr at 02/11/18 0357 1,000 mL at 02/11/18 0357  . acetaminophen (TYLENOL) tablet 650 mg  650 mg Oral Q4H PRN Hosie Poisson, MD   650 mg at 02/08/18 1910  . amLODipine (NORVASC) tablet 10  mg  10 mg Oral Daily Magdalen Spatz, NP   10 mg at 02/11/18 1610  . Ampicillin-Sulbactam (UNASYN) 3 g in sodium chloride 0.9 % 100 mL IVPB  3 g Intravenous Q8H Pierce, Dwayne A, RPH 200 mL/hr at 02/11/18 0357 3 g at 02/11/18 0357  . aspirin chewable tablet 81 mg  81 mg Oral Daily Magdalen Spatz, NP   81 mg at 02/11/18 9604  . benzonatate (TESSALON) capsule 200 mg  200 mg Oral TID PRN Hosie Poisson, MD      . folic acid (FOLVITE) tablet 1 mg  1 mg Oral Daily Magdalen Spatz, NP   1 mg at 02/11/18 5409  . gabapentin (NEURONTIN) tablet 300 mg  300 mg Oral TID Hosie Poisson, MD   300 mg at 02/11/18 0921  . heparin injection 5,000 Units  5,000 Units Subcutaneous Q8H Vianne Bulls, MD   5,000 Units at 02/10/18 2213  . hydrALAZINE (APRESOLINE) injection 10 mg  10 mg Intravenous Q6H PRN Hosie Poisson, MD      . insulin aspart (novoLOG) injection 0-20 Units  0-20 Units Subcutaneous TID WC Hosie Poisson, MD   2 Units at 02/11/18 0940  . insulin aspart (novoLOG) injection 0-5 Units  0-5 Units Subcutaneous QHS Hosie Poisson, MD   2 Units at 02/07/18 2251  . insulin glargine (LANTUS) injection 25 Units  25 Units Subcutaneous Daily Hosie Poisson, MD   25 Units at 02/11/18 0926  . LORazepam (ATIVAN) injection 0.5 mg  0.5 mg Intravenous Q4H PRN Magdalen Spatz, NP   0.5 mg at 02/10/18 2214  . MEDLINE mouth rinse  15 mL Mouth Rinse BID Rush Farmer, MD   15 mL at 02/10/18 1249  . metoCLOPramide (REGLAN) injection 5 mg  5 mg Intravenous Q6H Hosie Poisson, MD   5 mg at 02/11/18 0122  . multivitamin with minerals tablet 1 tablet  1 tablet Oral Daily Magdalen Spatz, NP   1 tablet at 02/11/18 8119  . nicotine (NICODERM CQ - dosed in mg/24 hours) patch 14 mg  14 mg Transdermal Daily Opyd, Ilene Qua, MD   14 mg at 02/11/18 0923  . ondansetron (ZOFRAN) injection 4 mg  4 mg Intravenous Q6H PRN Bodenheimer, Charles A, NP   4 mg at 02/07/18 2215  . oxyCODONE-acetaminophen (PERCOCET/ROXICET) 5-325 MG per tablet 1 tablet  1  tablet Oral Q6H PRN Juanito Doom, MD   1 tablet at 02/10/18 2003  . pantoprazole (PROTONIX) EC tablet 40 mg  40 mg Oral Q0600 Hosie Poisson, MD   40 mg at 02/11/18 0921  . pneumococcal 23 valent vaccine (PNU-IMMUNE) injection 0.5 mL  0.5 mL Intramuscular Tomorrow-1000 Rush Farmer, MD      . potassium chloride SA (K-DUR,KLOR-CON) CR tablet 40 mEq  40 mEq Oral Once Hosie Poisson, MD      . [place]   Oral PRN Hosie Poisson, MD      . thiamine (VITAMIN B-1) tablet 100 mg  100 mg Oral Daily Magdalen Spatz, NP  100 mg at 02/11/18 9432     Discharge Medications: Please see discharge summary for a list of discharge medications.  Relevant Imaging Results:  Relevant Lab Results:   Additional Information SS#: [id number]  Geralynn Ochs, LCSW

## 2018-02-12 LAB — BASIC METABOLIC PANEL
ANION GAP: 7 (ref 5–15)
BUN: 11 mg/dL (ref 8–23)
CO2: 29 mmol/L (ref 22–32)
Calcium: 8.7 mg/dL — ABNORMAL LOW (ref 8.9–10.3)
Chloride: 101 mmol/L (ref 98–111)
Creatinine, Ser: 1.11 mg/dL (ref 0.61–1.24)
GFR calc Af Amer: >60 mL/min (ref >60)
GLUCOSE: 125 mg/dL — AB (ref 70–99)
Potassium: 4.5 mmol/L (ref 3.5–5.1)
Sodium: 137 mmol/L (ref 135–145)

## 2018-02-12 LAB — CBC
HEMATOCRIT: 29.1 % — AB (ref 39.0–52.0)
Hemoglobin: 9.2 g/dL — ABNORMAL LOW (ref 13.0–17.0)
MCH: 29.5 pg (ref 26.0–34.0)
MCHC: 31.6 g/dL (ref 30.0–36.0)
MCV: 93.3 fL (ref 80.0–100.0)
PLATELETS: 337 10*3/uL (ref 150–400)
RBC: 3.12 MIL/uL — AB (ref 4.22–5.81)
RDW: 13.2 % (ref 11.5–15.5)
WBC: 9.2 10*3/uL (ref 4.0–10.5)
nRBC: 0 % (ref 0.0–0.2)

## 2018-02-12 LAB — GLUCOSE, CAPILLARY
GLUCOSE-CAPILLARY: 189 mg/dL — AB (ref 70–99)
GLUCOSE-CAPILLARY: 326 mg/dL — AB (ref 70–99)
GLUCOSE-CAPILLARY: 191 mg/dL — AB (ref 70–99)
Glucose-Capillary: 188 mg/dL — ABNORMAL HIGH (ref 70–99)

## 2018-02-12 LAB — OCCULT BLOOD X 1 CARD TO LAB, STOOL: Fecal Occult Bld: POSITIVE — AB

## 2018-02-12 MED ORDER — ALUM & MAG HYDROXIDE-SIMETH 200-200-20 MG/5ML PO SUSP
30.0000 mL | Freq: Once | ORAL | Status: AC
  Administered 2018-02-12: 30 mL via ORAL
  Filled 2018-02-12: qty 30

## 2018-02-12 MED ORDER — LORAZEPAM 1 MG PO TABS
1.0000 mg | ORAL_TABLET | Freq: Once | ORAL | Status: AC
  Administered 2018-02-12: 1 mg via ORAL
  Filled 2018-02-12: qty 1

## 2018-02-12 MED ORDER — AMOXICILLIN-POT CLAVULANATE 875-125 MG PO TABS
1.0000 | ORAL_TABLET | Freq: Two times a day (BID) | ORAL | Status: DC
  Administered 2018-02-12 – 2018-02-16 (×9): 1 via ORAL
  Filled 2018-02-12 (×9): qty 1

## 2018-02-12 MED ORDER — HYDROCORTISONE 2.5 % RE CREA: TOPICAL_CREAM | RECTAL | 1 refills | Status: DC

## 2018-02-12 MED ORDER — LIDOCAINE VISCOUS HCL 2 % MT SOLN
15.0000 mL | Freq: Once | OROMUCOSAL | Status: AC
  Administered 2018-02-12: 15 mL via ORAL
  Filled 2018-02-12: qty 15

## 2018-02-12 NOTE — Care Management Note (Signed)
Case Management Note  Patient Details  Name: [name] MRN: [id number] Date of Birth: [birth year]  Subjective/Objective:                    Action/Plan: Awaiting insurance authorization for SNF rehab. CM following.  Expected Discharge Date:                  Expected Discharge Plan:  Skilled Nursing Facility  In-House Referral:  Clinical Social Work  Discharge planning Services     Post Acute Care Choice:    Choice offered to:     DME Arranged:    DME Agency:     HH Arranged:    [place] Agency:     Status of Service:  In process, will continue to follow  If discussed at Long Length of Stay Meetings, dates discussed:    Additional Comments:  Pollie Friar, RN 02/12/2018, 3:50 PM

## 2018-02-12 NOTE — Progress Notes (Signed)
CSW following for discharge plan. Patient has received a bed offer at [facility]. CSW contacted to confirm bed availability. [facility] initiated insurance authorization earlier today. Authorization not yet received for patient to admit to SNF.  CSW to follow.  Laveda Abbe, [place] Clinical Social Worker [phone]

## 2018-02-12 NOTE — Progress Notes (Signed)
  Speech Language Pathology Treatment: Dysphagia  Patient Details Name: [name] MRN: [id number] DOB: 07-01-1955 Today's Date: 02/12/2018 Time: 4098-1191 SLP Time Calculation (min) (ACUTE ONLY): 21 min  Assessment / Plan / Recommendation Clinical Impression  Pt today complaining of pain and waiting for pain medication however he is willing to participate in therapy.  SLP initiated RMST with Respironics Trainer- Expiratory- set at level 7.  Initially pt required max cues for adequacy of performance and fading to min cues.  Reviewed importance to rest if short of breath, dizzy or having pain.  Using teach back and written tips, pt educated.    Observed pt with consumption of nectar thick Sprite via cup and straw.  No indication of airway compromise.  Pt advised he enjoyed his breakfast of eggs and sausage.  Pt inquiring re: diet advancement to which SLP advised he will need stepwise progression to assure best airway protection.  Pt agreeable.    Will follow up Tuesday November 4th if pt remains in hospital - please reconsult before if needed.    HPI HPI: Pt is a 62 y/o male with history of medical noncompliance, polysubstance abuse, and diabetes.  Admitted 10/22 with hyperglycemia, diabetic ketoacidosis, dehydration and renal failure after a fall at home.  Initially was alert and oriented but became more encephalopathic overnight. Has had some episodes of vomiting.  Pt underwent BSE yesterday and was placed on dys1/thin diet.  Heard pt coughing from outside the room and RN reports he took pt's meal away due to aspiration concerns.  Follow up to assess tolerance however pt was made npo due to continued coughing with po intake.        SLP Plan  Continue with current plan of care       Recommendations  Diet recommendations: Dysphagia 2 (fine chop);Nectar-thick liquid Liquids provided via: Cup;Straw Medication Administration: Crushed with puree Supervision: Full supervision/cueing for  compensatory strategies(absolute full supervision) Compensations: Slow rate;Effortful swallow Postural Changes and/or Swallow Maneuvers: Seated upright 90 degrees;Upright 30-60 min after meal                Oral Care Recommendations: Oral care BID Follow up Recommendations: (tbd) SLP Visit Diagnosis: Dysphagia, oropharyngeal phase (R13.12) Plan: Continue with current plan of care       GO                [name] 02/12/2018, 10:30 AM   Luanna Salk, MS [facility] SLP Acute Rehab Services Pager [phone] Office [phone]

## 2018-02-12 NOTE — Progress Notes (Signed)
Pt refused CPAP. RT will monitor as needed throughout the night.

## 2018-02-12 NOTE — Progress Notes (Signed)
CSW following for discharge plan. Patient awaiting insurance authorization for transfer to [facility] for SNF. Authorization not yet received at this time.  CSW to follow.  Laveda Abbe, [place] Clinical Social Worker [phone]

## 2018-02-12 NOTE — Progress Notes (Signed)
Patient has all belongings in chair by the window. He states "I want to keep and eye on it." They include a walled with an id, debit card, social security card, birth certificate, and a five dollar bill. A plastic bag with insulin needles, and insulin vial, a bunch of papers including phone numbers and insurance cards. He also has shoes, a hat, and toiletries. The patient says he wishes to throw out his jeans, socks, and shirt with were soaking wet. He stated "those stink, throw it out I can pick up more at [facility] [place]." Nursing aid molly in room as well.

## 2018-02-12 NOTE — Discharge Summary (Addendum)
Physician Discharge Summary  THEOPHILE HARVIE JJK:[id number] DOB: [birth year] DOA: 02/03/2018  PCP: [name], MD  Admit date: 02/03/2018 Discharge date: 02/12/2018  Admitted From: HOME.  Disposition:  SNF.   Recommendations for Outpatient Follow-up:  1. Follow up with PCP in 1-2 weeks 2. Please obtain BMP/CBC in one week Please follow up with a CXR in 4 weeks to evaluate for resolution of the pneumonia.  Please get SLP to follow up at the facility if his swallowing improved . Recommend work up for anemia as outpatient.   Discharge Condition:: stable.  CODE STATUS: full code.  Diet recommendation:  Dysphagia 2 (fine chop);Nectar-thick liquid Liquids provided via: Cup;Straw Medication Administration: Crushed with puree Supervision: Full supervision/cueing for compensatory strategies(absolute full supervision) Compensations: Slow rate;Effortful swallow Postural Changes and/or Swallow Maneuvers: Seated upright 90 degrees;Upright 30-60 min after meal        Brief/Interim Summary: 62 year old male patient with history of medical noncompliance, polysubstance abuse, and diabetes. Admitted 10/22 with hyperglycemia, diabetic ketoacidosis, dehydration and renal failure after a fall at home. Initially was alert and oriented but became more encephalopathic overnight. Critical care asked to see on 10/23 for progressive metabolic encephalopathy.  10/22 admitted with DKA, AKI, dehydration after a fall. Diagnostic imaging negative for acute findings on CT head or C-spine. Was alert and oriented on admission 10/23: Anion gap closed overnight, renal function improved some, however became progressively encephalopathic overnight requiring escalating Lorazepam critical care asked to see given concern about mental status, airway protection, and critical care services.  Patient was briefly on Precedex drip and was weaned off and he was transferred to [facility] on 10/26.   Due to continuous aspiration  , he was restarted on IV unasyn for aspiration pneumonia and recommend another 3 days of augmentin on discharge to complete 10 day of antibiotics.    Discharge Diagnoses:  Principal Problem:   DKA (diabetic ketoacidoses) ([place]) Active Problems:   COPD, mild ([name]   Acute kidney injury ([place])   Essential hypertension   Syncope   Leukocytosis   Acute metabolic encephalopathy   Demand ischemia of myocardium ([name]   Abrasions of multiple sites  Acute metabolic encephalopathy probably secondary to withdrawal symptoms from ETOH.  Weaned off precedex and transferred to [facility] .  He is able to answer questions appropriately.  Patient does not have any withdrawal symptoms at this time.  Continue to monitor on the same medications.    Sleep apnea: CPAP as inpatient.    AKI:  Improving with IV fluids, creatinine back to baseline today.  No changes in meds.   NSTEMI;  Probably demand ischemia from DKA.  Cardiology signed off . No CATH .    DKA; Resolved.   Hypokalemia replaced.     Hypophosphatemia:  Replaced. Repeat in am show normal levels.   Diabetes mellitus uncontrolled from hyperglycemia due to non compliant to diet. CBG (last 3)  CBG (last 3)  Recent Labs    02/11/18 1654 02/11/18 2106 02/12/18 0628  GLUCAP 135* 151* 189*      Aspiration pneumonitis: CXR shows persistent left lung pneumonia.  Another 2 days of Augmentin to complete the course.   Afebrile and wbc count normalized.     Hypertensive urgency:  Improved. Continue with norvasc and hydralazine.    Anemia of chronic disease:  Hemoglobin around between 10 to 11.    Some streaking of blood in the stools and pt reports some pain while defecating, probably hemorrhoidal His hemoglobin is stable. Recommend  anusol on discharge and follow up as outpatient. .     Leukocytosis: Suspect from aspiration pneumonia. Resolved.    Fever Suspect ongoing aspiration get blood  cultures , neg so far.  CXR shows persistent left lung pneumonia.  Afebrile , wbc count normalized, another 2 days of augmentin to complete the course.    Discharge Instructions   Allergies as of 02/12/2018      Reactions   Codeine Itching   Tolerates hydrocodone/apap      Medication List    STOP taking these medications   aspirin 81 MG EC tablet Replaced by:  aspirin 81 MG chewable tablet   insulin NPH Human 100 UNIT/ML injection Commonly known as:  HUMULIN N,NOVOLIN N   nicotine 14 mg/24hr patch Commonly known as:  NICODERM CQ - dosed in mg/24 hours     TAKE these medications   amLODipine 10 MG tablet Commonly known as:  NORVASC Take 1 tablet (10 mg total) by mouth daily.   amoxicillin-clavulanate 875-125 MG tablet Commonly known as:  AUGMENTIN Take 1 tablet by mouth every 12 (twelve) hours for 3 days.   aspirin 81 MG chewable tablet Chew 1 tablet (81 mg total) by mouth daily. Replaces:  aspirin 81 MG EC tablet   benzonatate 200 MG capsule Commonly known as:  TESSALON Take 1 capsule (200 mg total) by mouth 3 (three) times daily as needed for cough.   blood glucose meter kit and supplies Kit Dispense based on patient and insurance preference. Use up to four times daily as directed. (FOR ICD-9 250.00, 250.01).   folic acid 1 MG tablet Commonly known as:  FOLVITE Take 1 tablet (1 mg total) by mouth daily.   gabapentin 600 MG tablet Commonly known as:  NEURONTIN Take 0.5 tablets (300 mg total) by mouth 3 (three) times daily.   hydrALAZINE 25 MG tablet Commonly known as:  APRESOLINE Take 1 tablet (25 mg total) by mouth every 8 (eight) hours.   hydrocortisone 2.5 % rectal cream Commonly known as:  ANUSOL-HC Apply rectally 2 times daily   insulin aspart 100 UNIT/ML injection Commonly known as:  novoLOG CBG 70 - 120: 0 units CBG 121 - 150: 3 units CBG 151 - 200: 4 units CBG 201 - 250: 7 units CBG 251 - 300: 11 units CBG 301 - 350: 15 units CBG 351 -  400: 20 units   insulin glargine 100 UNIT/ML injection Commonly known as:  LANTUS Inject 0.25 mLs (25 Units total) into the skin daily.   metoCLOPramide 5 MG tablet Commonly known as:  REGLAN Take 1 tablet (5 mg total) by mouth 3 (three) times daily.   multivitamin with minerals Tabs tablet Take 1 tablet by mouth daily.   pantoprazole 40 MG tablet Commonly known as:  PROTONIX Take 1 tablet (40 mg total) by mouth daily at 6 (six) AM.   RESOURCE THICKENUP CLEAR Powd Use as recommended with every meal.   thiamine 100 MG tablet Take 1 tablet (100 mg total) by mouth daily.       Contact information for follow-up providers    [name], MD. Schedule an appointment as soon as possible for a visit in 1 week(s).   Specialty:  Internal Medicine Contact information: [place] [address] [phone]            Contact information for after-discharge care    Destination    [facility] .   Service:  Skilled Nursing  Contact information: [address] [place] [address] [phone]                 Allergies  Allergen Reactions  . Codeine Itching    Tolerates hydrocodone/apap    Consultations:  None.    Procedures/Studies: Dg Chest 2 View  Result Date: 02/09/2018 CLINICAL DATA:  Fever, aspiration pneumonia. EXAM: CHEST - 2 VIEW COMPARISON:  Portable chest x-ray of February 07, 2018 FINDINGS: The lungs remain mildly hyperinflated. There is a persistent infiltrate in the left lower lobe posterior-medially. There is no pleural effusion or pneumothorax. The heart and pulmonary vascularity are normal. The mediastinum is normal in width. The bony thorax exhibits no acute abnormality. IMPRESSION: Persistent left lower lobe pneumonia. Underlying COPD or reactive airway disease. Electronically Signed   By: David  Martinique M.D.   On: 02/09/2018 10:21   Dg Chest 2 View  Result Date: 02/03/2018 CLINICAL DATA:   Syncope EXAM: CHEST - 2 VIEW COMPARISON:  01/29/2018, 12/23/2017 FINDINGS: The heart size and mediastinal contours are within normal limits. Both lungs are clear. No pneumothorax. IMPRESSION: No active cardiopulmonary disease. Electronically Signed   By: Donavan Foil M.D.   On: 02/03/2018 20:03   Dg Chest 2 View  Result Date: 01/29/2018 CLINICAL DATA:  Cough.  Syncope.  Smoker. EXAM: CHEST - 2 VIEW COMPARISON:  12/23/2017 FINDINGS: The heart size and mediastinal contours are within normal limits. Both lungs are clear. Several old left rib fracture deformities are again noted. IMPRESSION: No active cardiopulmonary disease. Electronically Signed   By: Earle Gell M.D.   On: 01/29/2018 20:17   Dg Tibia/fibula Left  Result Date: 01/20/2018 CLINICAL DATA:  Left lower leg pain after fall down steps. EXAM: LEFT TIBIA AND FIBULA - 2 VIEW COMPARISON:  None. FINDINGS: There is no evidence of fracture or other focal bone lesions. Soft tissues are unremarkable. IMPRESSION: Negative. Electronically Signed   By: Marijo Conception, M.D.   On: 01/20/2018 15:02   Dg Ankle Complete Left  Result Date: 01/29/2018 CLINICAL DATA:  Left ankle pain. EXAM: LEFT ANKLE COMPLETE - 3+ VIEW COMPARISON:  01/20/2018 FINDINGS: There is no evidence of fracture, dislocation, or joint effusion. There is no evidence of arthropathy or other focal bone abnormality. Extensive peripheral vascular calcification noted. IMPRESSION: No acute findings. Electronically Signed   By: Earle Gell M.D.   On: 01/29/2018 20:19   Dg Ankle Complete Left  Result Date: 01/20/2018 CLINICAL DATA:  Left ankle pain after fall down steps. EXAM: LEFT ANKLE COMPLETE - 3+ VIEW COMPARISON:  None. FINDINGS: There is no evidence of fracture, dislocation, or joint effusion. There is no evidence of arthropathy or other focal bone abnormality. Soft tissues are unremarkable. IMPRESSION: Negative. Electronically Signed   By: Marijo Conception, M.D.   On: 01/20/2018 15:02    Ct Head Wo Contrast  Result Date: 02/03/2018 CLINICAL DATA:  Ct head/cspine wo, Pt has been walking in the rain today trying to get home after a dc from hospital, pt is cold to touch. Pt has abrasion to forehead, he fell face forwards onto cement. Pt has neck back and head pain 10/10. EXAM: CT HEAD WITHOUT CONTRAST CT CERVICAL SPINE WITHOUT CONTRAST TECHNIQUE: Multidetector CT imaging of the head and cervical spine was performed following the standard protocol without intravenous contrast. Multiplanar CT image reconstructions of the cervical spine were also generated. COMPARISON:  CT of the head on 01/30/2018 FINDINGS: CT HEAD FINDINGS Brain: There is central and  cortical atrophy. Periventricular white matter changes are consistent with small vessel disease. The basilar cisterns and ventricles have a normal appearance. There is no CT evidence for acute infarction or hemorrhage. A 4 millimeter calcified lesion is identified along the INNER table of the RIGHT frontal bone, consistent with small meningioma. There is no associated edema or mass effect. The appearance is stable. Vascular: There is dense atherosclerotic calcification of the internal carotid arteries. Skull: Normal. Negative for fracture or focal lesion. Sinuses/Orbits: Soft tissue swelling, polyp, or mucous retention cyst within the LEFT maxillary sinus. Chronic deformity of the nasal bones consistent with remote fracture. Other: None CT CERVICAL SPINE FINDINGS Alignment: Normal. Skull base and vertebrae: No acute fracture. No primary bone lesion or focal pathologic process. Soft tissues and spinal canal: There is atherosclerotic calcification of the vertebral and carotid arteries. Spinal canal is unremarkable. Disc levels:  Disc height loss and uncovertebral spurring at C6-7. Upper chest: Negative. Other: None IMPRESSION: 1. Atrophy and small vessel disease. 2.  No evidence for acute intracranial abnormality. 3. Remote fractures of the nasal  bones. 4. Stable 4 millimeter calcified meningioma RIGHT frontal region not associated with edema or mass effect. 5. Mid cervical degenerative changes without evidence for acute cervical spine fracture. Electronically Signed   By: Nolon Nations M.D.   On: 02/03/2018 20:29   Ct Head Wo Contrast  Result Date: 01/30/2018 CLINICAL DATA:  Syncope EXAM: CT HEAD WITHOUT CONTRAST TECHNIQUE: Contiguous axial images were obtained from the base of the skull through the vertex without intravenous contrast. COMPARISON:  04/18/2017 FINDINGS: Brain: No acute territorial infarction, hemorrhage, or intracranial mass is visualized. Mild small vessel ischemic changes of the white matter. Mild atrophy. Stable ventricle size. Vascular: No hyperdense vessels. Vertebral artery and carotid vascular calcification Skull: Normal. Negative for fracture or focal lesion. Sinuses/Orbits: Chronic nasal bone deformity. Mild mucosal thickening in the sinuses. Mucous retention cyst in the left maxillary sinus. Other: None IMPRESSION: 1. No CT evidence for acute intracranial abnormality. 2. Atrophy and small vessel ischemic changes of the white matter Electronically Signed   By: Donavan Foil M.D.   On: 01/30/2018 02:13   Ct Cervical Spine Wo Contrast  Result Date: 02/03/2018 CLINICAL DATA:  Ct head/cspine wo, Pt has been walking in the rain today trying to get home after a dc from hospital, pt is cold to touch. Pt has abrasion to forehead, he fell face forwards onto cement. Pt has neck back and head pain 10/10. EXAM: CT HEAD WITHOUT CONTRAST CT CERVICAL SPINE WITHOUT CONTRAST TECHNIQUE: Multidetector CT imaging of the head and cervical spine was performed following the standard protocol without intravenous contrast. Multiplanar CT image reconstructions of the cervical spine were also generated. COMPARISON:  CT of the head on 01/30/2018 FINDINGS: CT HEAD FINDINGS Brain: There is central and cortical atrophy. Periventricular white matter  changes are consistent with small vessel disease. The basilar cisterns and ventricles have a normal appearance. There is no CT evidence for acute infarction or hemorrhage. A 4 millimeter calcified lesion is identified along the INNER table of the RIGHT frontal bone, consistent with small meningioma. There is no associated edema or mass effect. The appearance is stable. Vascular: There is dense atherosclerotic calcification of the internal carotid arteries. Skull: Normal. Negative for fracture or focal lesion. Sinuses/Orbits: Soft tissue swelling, polyp, or mucous retention cyst within the LEFT maxillary sinus. Chronic deformity of the nasal bones consistent with remote fracture. Other: None CT CERVICAL SPINE FINDINGS Alignment: Normal. Skull base  and vertebrae: No acute fracture. No primary bone lesion or focal pathologic process. Soft tissues and spinal canal: There is atherosclerotic calcification of the vertebral and carotid arteries. Spinal canal is unremarkable. Disc levels:  Disc height loss and uncovertebral spurring at C6-7. Upper chest: Negative. Other: None IMPRESSION: 1. Atrophy and small vessel disease. 2.  No evidence for acute intracranial abnormality. 3. Remote fractures of the nasal bones. 4. Stable 4 millimeter calcified meningioma RIGHT frontal region not associated with edema or mass effect. 5. Mid cervical degenerative changes without evidence for acute cervical spine fracture. Electronically Signed   By: Nolon Nations M.D.   On: 02/03/2018 20:29   Ct Abdomen Pelvis W Contrast  Result Date: 02/02/2018 CLINICAL DATA:  Fall down stairs, generalized pain. Diverticulitis suspected. EXAM: CT ABDOMEN AND PELVIS WITH CONTRAST TECHNIQUE: Multidetector CT imaging of the abdomen and pelvis was performed using the standard protocol following bolus administration of intravenous contrast. CONTRAST:  155m ISOVUE-300 IOPAMIDOL (ISOVUE-300) INJECTION 61% COMPARISON:  CT pelvis dated 02/23/2005. MRI  abdomen dated 06/15/2004. FINDINGS: Lower chest: Lung bases are clear. Hepatobiliary: Stable mass within the LEFT liver lobe, compatible with benign hemangioma. No suspicious mass or lesion within the liver. No hepatic injury or perihepatic hematoma. Gallbladder appears normal. No bile duct dilatation. Pancreas: Unremarkable. No pancreatic ductal dilatation or surrounding inflammatory changes. Spleen: No splenic injury or perisplenic hematoma. Adrenals/Urinary Tract: Adrenal glands are unremarkable. 3 mm nonobstructing RIGHT renal stone. Kidneys otherwise unremarkable without suspicious mass, stone or hydronephrosis. No perinephric hemorrhage. Bladder appears normal, moderately distended. Stomach/Bowel: No dilated large or small bowel loops. No evidence of bowel Reise inflammation or bowel Wrenn injury. Fairly large amount of stool throughout the nondistended colon. Appendix is normal. Stomach is unremarkable, partially decompressed. Vascular/Lymphatic: Aortic atherosclerosis. No enlarged abdominal or pelvic lymph nodes. Reproductive: Prostate is unremarkable. Other: No free fluid or hemorrhage within the abdomen or pelvis. No free intraperitoneal air. Musculoskeletal: Mild degenerative spondylosis of the lumbar spine. No acute appearing osseous abnormality. No evidence of acute fracture or subluxation within the lumbar spine or osseous pelvis. IMPRESSION: 1. No acute findings within the abdomen or pelvis. No evidence of solid organ injury. No free fluid or hemorrhage. No bowel obstruction or evidence of bowel Ungar inflammation. Appendix is normal. No osseous fracture or dislocation. 2. Fairly large amount of stool throughout the nondistended colon (constipation?). 3. Stable benign hemangioma within the LEFT liver lobe. 4.  Aortic Atherosclerosis (ICD10-I70.0). 5. 3 mm nonobstructing RIGHT renal stone. Electronically Signed   By: SFranki CabotM.D.   On: 02/02/2018 17:00   [place]al  Result Date:  02/04/2018 CLINICAL DATA:  63year old male with acute renal failure. EXAM: RENAL / URINARY TRACT ULTRASOUND COMPLETE COMPARISON:  CT Abdomen and Pelvis 02/02/2018 the. Abdomen ultrasound 09/30/2016. FINDINGS: Right Kidney: Length: 10.9 centimeters. Echogenicity at the upper limits of normal. No mass or hydronephrosis visualized. Right nephrolithiasis redemonstrated on image 20. Left Kidney: Length: 10.5 centimeters. Echogenicity at the upper limits of normal. No mass or hydronephrosis visualized. Bladder: Posterior bladder diverticulum redemonstrated. No urinary debris identified. IMPRESSION: 1. No acute renal findings. 2. Right nephrolithiasis.  Bladder diverticulum. Electronically Signed   By: HGenevie AnnM.D.   On: 02/04/2018 16:07   Dg Chest Port 1 View  Result Date: 02/07/2018 CLINICAL DATA:  62year old male with nausea vomiting. Recent fall EXAM: PORTABLE CHEST 1 VIEW COMPARISON:  02/04/2018 and earlier. FINDINGS: Portable AP upright view at 0716 hours. Lung volumes appear normal. Allowing for portable technique  the lungs are clear. Normal cardiac size and mediastinal contours. Visualized tracheal air column is within normal limits. No pneumothorax. Chronic left lateral 4th and 8th rib fractures are chronic and stable. No acute osseous abnormality identified. IMPRESSION: 1. No cardiopulmonary abnormality. 2. Chronic left rib fractures. Electronically Signed   By: Genevie Ann M.D.   On: 02/07/2018 09:48   Dg Chest Port 1 View  Result Date: 02/04/2018 CLINICAL DATA:  Syncope.  Hyperglycemia. EXAM: PORTABLE CHEST 1 VIEW COMPARISON:  February 03, 2018 FINDINGS: There is no edema or consolidation. There is mild left base atelectasis. Heart is upper normal in size with pulmonary vascularity normal. No adenopathy. There is a healed fracture of the lateral left fourth rib. IMPRESSION: Left base atelectasis. No edema or consolidation. Heart upper normal in size. Electronically Signed   By: Lowella Grip III  M.D.   On: 02/04/2018 12:41   Dg Knee Complete 4 Views Left  Result Date: 01/29/2018 CLINICAL DATA:  Left knee pain. EXAM: LEFT KNEE - COMPLETE 4+ VIEW COMPARISON:  None. FINDINGS: No evidence of fracture, dislocation, or joint effusion. No evidence of arthropathy or other focal bone abnormality. Extensive peripheral vascular calcification noted. IMPRESSION: No acute findings. Electronically Signed   By: Earle Gell M.D.   On: 01/29/2018 20:18   Dg Abd Portable 1v  Result Date: 02/07/2018 CLINICAL DATA:  62 year old male with nausea vomiting.  Recent fall EXAM: PORTABLE ABDOMEN - 1 VIEW COMPARISON:  CT Abdomen and Pelvis 02/02/2018 FINDINGS: Portable AP supine view at 0716 hours. Non obstructed bowel gas pattern. Negative visible lung bases. Iliac artery calcified atherosclerosis. No acute osseous abnormality identified. IMPRESSION: Negative. Electronically Signed   By: Genevie Ann M.D.   On: 02/07/2018 09:46   Dg Swallowing Func-speech Pathology  Result Date: 02/10/2018 Objective Swallowing Evaluation: Type of Study: MBS-Modified Barium Swallow Study  Patient Details Name: [name] MRN: [id number] Date of Birth: November 03, 1955 Today's Date: 02/10/2018 Time: SLP Start Time (ACUTE ONLY): 1007 -SLP Stop Time (ACUTE ONLY): 1038 SLP Time Calculation (min) (ACUTE ONLY): 31 min Past Medical History: Past Medical History: Diagnosis Date . DEPRESSION  . DIABETES MELLITUS, TYPE I  . DRUG ABUSE   pt should have NO controlled substances rx'ed . Glaucoma  . HEPATITIS C   chronic . Hypertension 02/18/2011 . Proliferative diabetic retinopathy(362.02)  . Vitiligo  Past Surgical History: Past Surgical History: Procedure Laterality Date . ABDOMINAL AORTOGRAM W/LOWER EXTREMITY N/A 07/25/2016  Procedure: Abdominal Aortogram w/Bilateral Lower Extremity Runoff;  Surgeon: Conrad [place], MD;  Location: [facility];  Service: Cardiovascular;  Laterality: N/A; . ABDOMINAL AORTOGRAM W/LOWER EXTREMITY N/A 12/24/2016  Procedure:  ABDOMINAL AORTOGRAM W/LOWER EXTREMITY;  Surgeon: Serafina Mitchell, MD;  Location: [facility];  Service: Cardiovascular;  Laterality: N/A; . AMPUTATION TOE Right 07/26/2016  Procedure: AMPUTATION GREAT TOE;  Surgeon: Serafina Mitchell, MD;  Location: [facility];  Service: Vascular;  Laterality: Right; . EYE SURGERY    retinal surgery x 2, right eye . INSERTION OF ILIAC STENT Right 07/26/2016  Procedure: INSERTION OF RIGHT POPLITEAL STENT WITH BALLOON ANGIOPLASTY;  Surgeon: Serafina Mitchell, MD;  Location: [facility];  Service: Vascular;  Laterality: Right; . LOWER EXTREMITY ANGIOGRAM Right 07/26/2016  Procedure: LOWER EXTREMITY ANGIOGRAM;  Surgeon: Serafina Mitchell, MD;  Location: [facility];  Service: Vascular;  Laterality: Right; . PERIPHERAL VASCULAR BALLOON ANGIOPLASTY Right 07/26/2016  Procedure: PERIPHERAL VASCULAR BALLOON ANGIOPLASTY  RIGHT ANTERIOR TIBEAL ARTERY AND RIGHT SUPERFICIAL FEMORAL ARTERY;  Surgeon: Serafina Mitchell, MD;  Location: [facility];  Service: Vascular;  Laterality: Right; . PERIPHERAL VASCULAR INTERVENTION Right 12/24/2016  Procedure: PERIPHERAL VASCULAR INTERVENTION;  Surgeon: Serafina Mitchell, MD;  Location: [facility];  Service: Cardiovascular;  Laterality: Right;  lower extr HPI: Pt is a 62 y/o male with history of medical noncompliance, polysubstance abuse, and diabetes.  Admitted 10/22 with hyperglycemia, diabetic ketoacidosis, dehydration and renal failure after a fall at home.  Initially was alert and oriented but became more encephalopathic overnight. Has had some episodes of vomiting.  Pt underwent BSE yesterday and was placed on dys1/thin diet.  Heard pt coughing from outside the room and RN reports he took pt's meal away due to aspiration concerns.  Follow up to assess tolerance and indication for instrumental evaluation.   Subjective: "I cough because I have COPD" Assessment / Plan / Recommendation CHL IP CLINICAL IMPRESSIONS 02/09/2018 Clinical Impression Patient presents with moderate  sensorimotor oropharyngeal dysphagia - Oral control impaired with liquids mostly resulting in premature spillage of oral residuals to pyriform sinus with liquids.  Thin liquid pooling in pyriform sinus then spills into open airway with next swallow resulting in silent aspiration.  Dry swallows on command were difficult for pt to perform. Head turn left *pt's reported weak side* did not improve airway protection or pharyngeal clearance.  Recommend pt solids be advanced to dys3/ground meats and modify to nectar thick liquids with free water between meals.  Pt will benefit from aggressive SlP to improve oral and pharyngeal strenghening as he states he is motivated.  Using live images/flouro loops educated pt to findings and reinforced effective strategies.   SLP Visit Diagnosis Dysphagia, oropharyngeal phase (R13.12) Attention and concentration deficit following -- Frontal lobe and executive function deficit following -- Impact on safety and function Moderate aspiration risk   CHL IP TREATMENT RECOMMENDATION 02/09/2018 Treatment Recommendations Therapy as outlined in treatment plan below   Prognosis 02/09/2018 Prognosis for Safe Diet Advancement Fair Barriers to Reach Goals Time post onset Barriers/Prognosis Comment -- CHL IP DIET RECOMMENDATION 02/09/2018 SLP Diet Recommendations Dysphagia 3 (Mech soft) solids;Nectar thick liquid;Other (Comment);Free water protocol after oral care Liquid Administration via Cup;No straw Medication Administration Whole meds with puree Compensations Slow rate;Small sips/bites;Follow solids with liquid;Effortful swallow Postural Changes Remain semi-upright after after feeds/meals (Comment);Seated upright at 90 degrees   CHL IP OTHER RECOMMENDATIONS 02/09/2018 Recommended Consults -- Oral Care Recommendations Oral care BID Other Recommendations --   CHL IP FOLLOW UP RECOMMENDATIONS 02/09/2018 Follow up Recommendations Home health SLP;Skilled Nursing facility   [facility] IP FREQUENCY AND DURATION  02/09/2018 Speech Therapy Frequency (ACUTE ONLY) min 2x/week Treatment Duration 2 weeks      CHL IP ORAL PHASE 02/09/2018 Oral Phase Impaired Oral - Pudding Teaspoon -- Oral - Pudding Cup -- Oral - Honey Teaspoon -- Oral - Honey Cup -- Oral - Nectar Teaspoon -- Oral - Nectar Cup Decreased bolus cohesion;Premature spillage Oral - Nectar Straw -- Oral - Thin Teaspoon Decreased bolus cohesion;Reduced posterior propulsion Oral - Thin Cup Decreased bolus cohesion;Lingual/palatal residue;Reduced posterior propulsion;Premature spillage Oral - Thin Straw Decreased bolus cohesion;Lingual/palatal residue;Reduced posterior propulsion;Premature spillage Oral - Puree Reduced posterior propulsion;Premature spillage Oral - Mech Soft -- Oral - Regular Decreased bolus cohesion;Reduced posterior propulsion;Premature spillage Oral - Multi-Consistency Reduced posterior propulsion;Lingual/palatal residue;Decreased bolus cohesion;Premature spillage Oral - Pill -- Oral Phase - Comment --  CHL IP PHARYNGEAL PHASE 02/09/2018 Pharyngeal Phase Impaired Pharyngeal- Pudding Teaspoon -- Pharyngeal -- Pharyngeal- Pudding Cup -- Pharyngeal --  Pharyngeal- Honey Teaspoon -- Pharyngeal -- Pharyngeal- Honey Cup -- Pharyngeal -- Pharyngeal- Nectar Teaspoon -- Pharyngeal -- Pharyngeal- Nectar Cup Reduced epiglottic inversion Pharyngeal -- Pharyngeal- Nectar Straw -- Pharyngeal -- Pharyngeal- Thin Teaspoon Delayed swallow initiation-pyriform sinuses Pharyngeal -- Pharyngeal- Thin Cup Delayed swallow initiation-pyriform sinuses;Reduced epiglottic inversion;Penetration/Aspiration during swallow;Trace aspiration;Penetration/Aspiration before swallow Pharyngeal Material enters airway, passes BELOW cords without attempt by patient to eject out (silent aspiration) Pharyngeal- Thin Straw Delayed swallow initiation-pyriform sinuses;Trace aspiration;Penetration/Aspiration during swallow Pharyngeal Material enters airway, passes BELOW cords without attempt by  patient to eject out (silent aspiration) Pharyngeal- Puree Reduced epiglottic inversion Pharyngeal -- Pharyngeal- Mechanical Soft -- Pharyngeal -- Pharyngeal- Regular Reduced epiglottic inversion Pharyngeal -- Pharyngeal- Multi-consistency Reduced epiglottic inversion;Penetration/Aspiration during swallow;Trace aspiration;Delayed swallow initiation-pyriform sinuses Pharyngeal Material enters airway, passes BELOW cords without attempt by patient to eject out (silent aspiration);Material enters airway, passes BELOW cords and not ejected out despite cough attempt by patient Pharyngeal- Pill -- Pharyngeal -- Pharyngeal Comment pt with aspiration of oral residuals of thin spilling from pyriform sinus into larynx/trachea with swallowing, small bolus with cognitive readiness "1,2,3" to swallow entire bolus helpful,  chin tuck posture worsened aspiration as it dumped residuals into larynx/trachea, cued "hock" did not clear residuals but was strong and rec pt continue this strategie  CHL IP CERVICAL ESOPHAGEAL PHASE 02/09/2018 Cervical Esophageal Phase WFL Pudding Teaspoon -- Pudding Cup -- Honey Teaspoon -- Honey Cup -- Nectar Teaspoon -- Nectar Cup -- Nectar Straw -- Thin Teaspoon -- Thin Cup -- Thin Straw -- Puree -- Mechanical Soft -- Regular -- Multi-consistency -- Pill -- Cervical Esophageal Comment -- [name] 02/10/2018, 7:47 AM  Luanna Salk, MS [facility] SLP Acute Rehab Services Pager [phone] Office [phone]             Vas US Carotid  Result Date: 01/30/2018 Carotid Arterial Duplex Study Indications: Syncope. Performing Technologist: Abram Sander  Examination Guidelines: A complete evaluation includes B-mode imaging, spectral Doppler, color Doppler, and power Doppler as needed of all accessible portions of each vessel. Bilateral testing is considered an integral part of a complete examination. Limited examinations for reoccurring indications may be performed as noted.  Right Carotid Findings:  +----------+--------+--------+--------+-----------+--------+           PSV cm/sEDV cm/sStenosisDescribe   Comments +----------+--------+--------+--------+-----------+--------+ CCA Prox  89      7               homogeneous         +----------+--------+--------+--------+-----------+--------+ CCA Distal58      0               homogeneous         +----------+--------+--------+--------+-----------+--------+ ICA Prox  215     40      40-59%  homogeneous         +----------+--------+--------+--------+-----------+--------+ ICA Mid   223     33                                  +----------+--------+--------+--------+-----------+--------+ ICA Distal88      15                                  +----------+--------+--------+--------+-----------+--------+ ECA       192     35                                  +----------+--------+--------+--------+-----------+--------+ +----------+--------+-------+--------+-------------------+  PSV cm/sEDV cmsDescribeArm Pressure (mmHG) +----------+--------+-------+--------+-------------------+ [id number]                                        +----------+--------+-------+--------+-------------------+ +---------+--------+--+--------+-+---------+ VertebralPSV cm/s50EDV cm/s8Antegrade +---------+--------+--+--------+-+---------+  Left Carotid Findings: +----------+--------+--------+--------+-----------+--------+           PSV cm/sEDV cm/sStenosisDescribe   Comments +----------+--------+--------+--------+-----------+--------+ CCA Prox  101     9               homogeneous         +----------+--------+--------+--------+-----------+--------+ CCA Distal70      12              homogeneous         +----------+--------+--------+--------+-----------+--------+ ICA Prox  78      16      1-39%   homogeneous         +----------+--------+--------+--------+-----------+--------+ ICA Distal88      24                                   +----------+--------+--------+--------+-----------+--------+ ECA       125                                         +----------+--------+--------+--------+-----------+--------+ +----------+--------+--------+--------+-------------------+ SubclavianPSV cm/sEDV cm/sDescribeArm Pressure (mmHG) +----------+--------+--------+--------+-------------------+           65                                          +----------+--------+--------+--------+-------------------+ +---------+--------+--+--------+--+---------+ VertebralPSV cm/s44EDV cm/s11Antegrade +---------+--------+--+--------+--+---------+  Summary: Right Carotid: Velocities in the right ICA are consistent with a 40-59%                stenosis. Left Carotid: Velocities in the left ICA are consistent with a 1-39% stenosis. Vertebrals: Bilateral vertebral arteries demonstrate antegrade flow. *See table(s) above for measurements and observations.  Electronically signed by Harold Barban MD on 01/30/2018 at 1:25:20 PM.    Final       Subjective: No chest pain or sob. Requesting to eat cheeseburger.  No complaints.   Discharge Exam: Vitals:   02/12/18 0455 02/12/18 0807  BP: (!) 177/64 (!) 177/69  Pulse: 80 79  Resp: 18 20  Temp: 97.9 F (36.6 C) 98.2 F (36.8 C)  SpO2: 97% 95%   Vitals:   02/11/18 1936 02/12/18 0003 02/12/18 0455 02/12/18 0807  BP: (!) 137/59 133/61 (!) 177/64 (!) 177/69  Pulse: 86 81 80 79  Resp: '18 18 18 20  '$ Temp: 99.5 F (37.5 C) 99 F (37.2 C) 97.9 F (36.6 C) 98.2 F (36.8 C)  TempSrc: Oral Oral Oral   SpO2: 100% 99% 97% 95%  Weight:      Height:        General: Pt is alert, awake, not in acute distress Cardiovascular: RRR, S1/S2 +, no rubs, no gallops Respiratory: CTA bilaterally, no wheezing, no rhonchi Abdominal: Soft, NT, ND, bowel sounds + Extremities: no edema, no cyanosis    The results of significant diagnostics from this hospitalization  (including imaging, microbiology, ancillary and laboratory) are listed below for reference.     Microbiology: Recent Results (from  the past 240 hour(s))  Culture, blood (routine x 2)     Status: None   Collection Time: 02/04/18  1:15 AM  Result Value Ref Range Status   Specimen Description BLOOD RIGHT HAND  Final   Special Requests   Final    BOTTLES DRAWN AEROBIC AND ANAEROBIC Blood Culture results may not be optimal due to an inadequate volume of blood received in culture bottles   Culture   Final    NO GROWTH 5 DAYS Performed at [facility], [place] [address], [place], [place] [address]    Report Status 02/09/2018 FINAL  Final  Culture, blood (routine x 2)     Status: None   Collection Time: 02/04/18  1:27 AM  Result Value Ref Range Status   Specimen Description BLOOD LEFT HAND  Final   Special Requests   Final    BOTTLES DRAWN AEROBIC AND ANAEROBIC Blood Culture results may not be optimal due to an inadequate volume of blood received in culture bottles   Culture   Final    NO GROWTH 5 DAYS Performed at [facility], [place] [address] [place], [place] [address]    Report Status 02/09/2018 FINAL  Final  Urine Culture     Status: Abnormal   Collection Time: 02/04/18 11:05 AM  Result Value Ref Range Status   Specimen Description URINE, CLEAN CATCH  Final   Special Requests   Final    NONE Performed at [facility], [place] [address] [place], [place] [address]    Culture 10,000 COLONIES/mL STAPHYLOCOCCUS HAEMOLYTICUS (A)  Final   Report Status 02/06/2018 FINAL  Final   Organism ID, Bacteria STAPHYLOCOCCUS HAEMOLYTICUS (A)  Final      Susceptibility   Staphylococcus haemolyticus - MIC*    CIPROFLOXACIN >=8 RESISTANT Resistant     GENTAMICIN >=16 RESISTANT Resistant     NITROFURANTOIN 32 SENSITIVE Sensitive     OXACILLIN >=4 RESISTANT Resistant     TETRACYCLINE <=1 SENSITIVE Sensitive     VANCOMYCIN 1 SENSITIVE Sensitive     TRIMETH/SULFA >=320 RESISTANT  Resistant     CLINDAMYCIN >=8 RESISTANT Resistant     RIFAMPIN <=0.5 SENSITIVE Sensitive     Inducible Clindamycin NEGATIVE Sensitive     * 10,000 COLONIES/mL STAPHYLOCOCCUS HAEMOLYTICUS  MRSA PCR Screening     Status: None   Collection Time: 02/04/18  1:28 PM  Result Value Ref Range Status   MRSA by PCR NEGATIVE NEGATIVE Final    Comment:        The GeneXpert MRSA Assay (FDA approved for NASAL specimens only), is one component of a comprehensive MRSA colonization surveillance program. It is not intended to diagnose MRSA infection nor to guide or monitor treatment for MRSA infections. Performed at [facility], [place] [address] [place], [place] [address]   Culture, blood (Routine X 2) w Reflex to ID Panel     Status: None (Preliminary result)   Collection Time: 02/08/18  5:06 PM  Result Value Ref Range Status   Specimen Description BLOOD BLOOD LEFT HAND  Final   Special Requests   Final    BOTTLES DRAWN AEROBIC ONLY Blood Culture results may not be optimal due to an inadequate volume of blood received in culture bottles   Culture   Final    NO GROWTH 3 DAYS Performed at [facility], [place] [address], [place], [place] [address]    Report Status PENDING  Incomplete  Culture,  blood (Routine X 2) w Reflex to ID Panel     Status: None (Preliminary result)   Collection Time: 02/08/18  5:06 PM  Result Value Ref Range Status   Specimen Description BLOOD BLOOD RIGHT HAND  Final   Special Requests   Final    BOTTLES DRAWN AEROBIC ONLY Blood Culture results may not be optimal due to an inadequate volume of blood received in culture bottles   Culture   Final    NO GROWTH 3 DAYS Performed at [facility], [place] [address] [place], [place] [address]    Report Status PENDING  Incomplete     Labs: BNP (last 3 results) No results for input(s): BNP in the last 8760 hours. Basic Metabolic Panel: Recent Labs  Lab 02/06/18 0324 02/07/18 0634 02/07/18 1221 02/08/18 1706  02/09/18 0707 02/10/18 0329 02/12/18 0448  NA 145 139  --  138 136 138 137  K 3.7 3.0*  --  4.0 3.9 4.4 4.5  CL 110 105  --  101 102 102 101  CO2 27 24  --  '23 27 29 29  '$ GLUCOSE 125* 259*  --  228* 348* 233* 125*  BUN 14 10  --  25* 26* 20 11  CREATININE 0.97 1.15  --  1.57* 1.44* 1.29* 1.11  CALCIUM 8.9 8.5*  --  8.2* 8.0* 8.0* 8.7*  MG 2.0  --  1.8  --   --   --   --   PHOS  --   --  1.8*  --  2.5  --   --    Liver Function Tests: No results for input(s): AST, ALT, ALKPHOS, BILITOT, PROT, ALBUMIN in the last 168 hours. No results for input(s): LIPASE, AMYLASE in the last 168 hours. No results for input(s): AMMONIA in the last 168 hours. CBC: Recent Labs  Lab 02/07/18 0634 02/09/18 0707 02/10/18 0329 02/11/18 1503 02/12/18 0448  WBC 14.7* 11.5* 9.4  --  9.2  HGB 11.3* 9.9* 8.6* 10.0* 9.2*  HCT 35.2* 30.2* 27.7* 31.8* 29.1*  MCV 90.7 92.1 92.6  --  93.3  PLT 272 256 267  --  337   Cardiac Enzymes: No results for input(s): CKTOTAL, CKMB, CKMBINDEX, TROPONINI in the last 168 hours. BNP: Invalid input(s): POCBNP CBG: Recent Labs  Lab 02/11/18 0925 02/11/18 1140 02/11/18 1654 02/11/18 2106 02/12/18 0628  GLUCAP 258* 239* 135* 151* 189*   D-Dimer No results for input(s): DDIMER in the last 72 hours. Hgb A1c No results for input(s): HGBA1C in the last 72 hours. Lipid Profile No results for input(s): CHOL, HDL, LDLCALC, TRIG, CHOLHDL, LDLDIRECT in the last 72 hours. Thyroid function studies No results for input(s): TSH, T4TOTAL, T3FREE, THYROIDAB in the last 72 hours.  Invalid input(s): FREET3 Anemia work up No results for input(s): VITAMINB12, FOLATE, FERRITIN, TIBC, IRON, RETICCTPCT in the last 72 hours. Urinalysis    Component Value Date/Time   COLORURINE YELLOW 02/03/2018 1917   APPEARANCEUR CLEAR 02/03/2018 1917   LABSPEC 1.020 02/03/2018 1917   PHURINE 5.0 02/03/2018 1917   GLUCOSEU >=500 (A) 02/03/2018 1917   GLUCOSEU 500 04/28/2012 1149   HGBUR  SMALL (A) 02/03/2018 [place] 02/03/2018 1917   BILIRUBINUR neg 04/28/2012 1053   KETONESUR 20 (A) 02/03/2018 1917   PROTEINUR 100 (A) 02/03/2018 1917   UROBILINOGEN 4.0 (H) 11/16/2013 1646   NITRITE NEGATIVE 02/03/2018 1917   LEUKOCYTESUR NEGATIVE 02/03/2018 1917   Sepsis Labs Invalid input(s): PROCALCITONIN,  WBC,  [place] [facility] Recent Results (from the past 240 hour(s))  Culture, blood (routine x 2)     Status: None   Collection Time: 02/04/18  1:15 AM  Result Value Ref Range Status   Specimen Description BLOOD RIGHT HAND  Final   Special Requests   Final    BOTTLES DRAWN AEROBIC AND ANAEROBIC Blood Culture results may not be optimal due to an inadequate volume of blood received in culture bottles   Culture   Final    NO GROWTH 5 DAYS Performed at [facility], [place] [address] [place], [place] [address]    Report Status 02/09/2018 FINAL  Final  Culture, blood (routine x 2)     Status: None   Collection Time: 02/04/18  1:27 AM  Result Value Ref Range Status   Specimen Description BLOOD LEFT HAND  Final   Special Requests   Final    BOTTLES DRAWN AEROBIC AND ANAEROBIC Blood Culture results may not be optimal due to an inadequate volume of blood received in culture bottles   Culture   Final    NO GROWTH 5 DAYS Performed at [facility], [place] [address], [place], [place] [address]    Report Status 02/09/2018 FINAL  Final  Urine Culture     Status: Abnormal   Collection Time: 02/04/18 11:05 AM  Result Value Ref Range Status   Specimen Description URINE, CLEAN CATCH  Final   Special Requests   Final    NONE Performed at [facility], [place] [address], [place], [place] [address]    Culture 10,000 COLONIES/mL STAPHYLOCOCCUS HAEMOLYTICUS (A)  Final   Report Status 02/06/2018 FINAL  Final   Organism ID, Bacteria STAPHYLOCOCCUS HAEMOLYTICUS (A)  Final      Susceptibility   Staphylococcus haemolyticus - MIC*    CIPROFLOXACIN >=8  RESISTANT Resistant     GENTAMICIN >=16 RESISTANT Resistant     NITROFURANTOIN 32 SENSITIVE Sensitive     OXACILLIN >=4 RESISTANT Resistant     TETRACYCLINE <=1 SENSITIVE Sensitive     VANCOMYCIN 1 SENSITIVE Sensitive     TRIMETH/SULFA >=320 RESISTANT Resistant     CLINDAMYCIN >=8 RESISTANT Resistant     RIFAMPIN <=0.5 SENSITIVE Sensitive     Inducible Clindamycin NEGATIVE Sensitive     * 10,000 COLONIES/mL STAPHYLOCOCCUS HAEMOLYTICUS  MRSA PCR Screening     Status: None   Collection Time: 02/04/18  1:28 PM  Result Value Ref Range Status   MRSA by PCR NEGATIVE NEGATIVE Final    Comment:        The GeneXpert MRSA Assay (FDA approved for NASAL specimens only), is one component of a comprehensive MRSA colonization surveillance program. It is not intended to diagnose MRSA infection nor to guide or monitor treatment for MRSA infections. Performed at [facility], [place] [address] [place], [place] [address]   Culture, blood (Routine X 2) w Reflex to ID Panel     Status: None (Preliminary result)   Collection Time: 02/08/18  5:06 PM  Result Value Ref Range Status   Specimen Description BLOOD BLOOD LEFT HAND  Final   Special Requests   Final    BOTTLES DRAWN AEROBIC ONLY Blood Culture results may not be optimal due to an inadequate volume of blood received in culture bottles   Culture   Final    NO GROWTH 3 DAYS Performed at [facility], [place] [address] [place], [place] [address]    Report Status  PENDING  Incomplete  Culture, blood (Routine X 2) w Reflex to ID Panel     Status: None (Preliminary result)   Collection Time: 02/08/18  5:06 PM  Result Value Ref Range Status   Specimen Description BLOOD BLOOD RIGHT HAND  Final   Special Requests   Final    BOTTLES DRAWN AEROBIC ONLY Blood Culture results may not be optimal due to an inadequate volume of blood received in culture bottles   Culture   Final    NO GROWTH 3 DAYS Performed at [facility], [place]  [address], [place], [place] [address]    Report Status PENDING  Incomplete     Time coordinating discharge: 32 minutes  SIGNED:   Hosie Poisson, MD  [name] 02/12/2018, 11:31 AM Pager   If 7PM-7AM, please contact night-coverage [email or web address] Password [email or web address]

## 2018-02-12 NOTE — Progress Notes (Signed)
Physical Therapy Treatment Patient Details Name: [name] MRN: [id number] DOB: 03/11/1956 Today's Date: 02/12/2018    History of Present Illness Pt is a 62 y/o male with history of medical noncompliance, polysubstance abuse, and diabetes.  Admitted 10/22 with hyperglycemia, diabetic ketoacidosis, dehydration and renal failure after a fall at home.  Initially was alert and oriented but became more encephalopathic overnight. Troponins were elevated and Cardiology was consulted and contributed that to demand ischemia.     PT Comments    Patient seen to progress mobility. Agitated upon PT arrival due to bathroom issues? Poor safety awareness throughout with patient impulsive with all mobility/activities. Requires cueing for safety throughout session. Able to progress mobility with RW into hallway, however limited due to fatigue at UE/LE. No buckling noted. PT to continue to follow.     Follow Up Recommendations  SNF;Supervision/Assistance - 24 hour     Equipment Recommendations  None recommended by PT    Recommendations for Other Services       Precautions / Restrictions Precautions Precautions: Fall Restrictions Weight Bearing Restrictions: No    Mobility  Bed Mobility Overal bed mobility: Needs Assistance Bed Mobility: Supine to Sit     Supine to sit: Min guard     General bed mobility comments: patient wanting to exit bed quickly even while tangled in bed sheets. Poor safety awareness  Transfers Overall transfer level: Needs assistance Equipment used: Rolling walker (2 wheeled) Transfers: Sit to/from Stand Sit to Stand: Min assist         General transfer comment: Min A to power up at bedside and [facility]; very impulsive requiring cueing for safety  Ambulation/Gait Ambulation/Gait assistance: Min guard Gait Distance (Feet): 25 Feet Assistive device: Rolling walker (2 wheeled) Gait Pattern/deviations: Step-through pattern;Decreased stride length;Decreased weight  shift to left;Shuffle;Trunk flexed Gait velocity: Decreased   General Gait Details: cueing for safety and seqeuncing with AD;   Stairs             Wheelchair Mobility    Modified Rankin (Stroke Patients Only)       Balance Overall balance assessment: Needs assistance Sitting-balance support: Bilateral upper extremity supported;Feet supported Sitting balance-Leahy Scale: Fair     Standing balance support: Bilateral upper extremity supported;During functional activity Standing balance-Leahy Scale: Poor Standing balance comment: reliant on UE support                            Cognition Arousal/Alertness: Awake/alert Behavior During Therapy: Restless;Anxious Overall Cognitive Status: Within Functional Limits for tasks assessed                                 General Comments: agitated regarding bathroom use; requires cueing to calm down/relax      Exercises      General Comments General comments (skin integrity, edema, etc.): very impuslive and slightly agitated throughout      Pertinent Vitals/Pain Pain Assessment: Faces Faces Pain Scale: Hurts little more Pain Location: B feet and buttocks Pain Descriptors / Indicators: Aching;Grimacing;Discomfort Pain Intervention(s): Limited activity within patient's tolerance;Monitored during session    Home Living                      Prior Function            PT Goals (current goals can now be found in the care plan section) Acute Rehab  PT Goals Patient Stated Goal: to be independent again PT Goal Formulation: With patient Time For Goal Achievement: 02/21/18 Potential to Achieve Goals: Good Progress towards PT goals: Progressing toward goals    Frequency    Min 3X/week      PT Plan Current plan remains appropriate    Co-evaluation              AM-PAC PT "6 Clicks" Daily Activity  Outcome Measure  Difficulty turning over in bed (including adjusting  bedclothes, sheets and blankets)?: None Difficulty moving from lying on back to sitting on the side of the bed? : A Little Difficulty sitting down on and standing up from a chair with arms (e.g., wheelchair, bedside commode, etc,.)?: Unable Help needed moving to and from a bed to chair (including a wheelchair)?: A Little Help needed walking in hospital room?: A Little Help needed climbing 3-5 steps with a railing? : A Lot 6 Click Score: 16    End of Session Equipment Utilized During Treatment: Gait belt Activity Tolerance: Patient tolerated treatment well Patient left: in chair;with call bell/phone within reach;with chair alarm set Nurse Communication: Mobility status PT Visit Diagnosis: Unsteadiness on feet (R26.81);Other abnormalities of gait and mobility (R26.89);Muscle weakness (generalized) (M62.81);History of falling (Z91.81)     Time: 0375-4360 PT Time Calculation (min) (ACUTE ONLY): 21 min  Charges:  $Gait Training: 8-22 mins                     Lanney Gins, PT, DPT Supplemental Physical Therapist 02/12/18 9:25 AM Pager: [phone] Office: [phone]

## 2018-02-13 LAB — CULTURE, BLOOD (ROUTINE X 2)
Culture: NO GROWTH
Culture: NO GROWTH

## 2018-02-13 LAB — GLUCOSE, CAPILLARY
GLUCOSE-CAPILLARY: 152 mg/dL — AB (ref 70–99)
GLUCOSE-CAPILLARY: 128 mg/dL — AB (ref 70–99)
Glucose-Capillary: 189 mg/dL — ABNORMAL HIGH (ref 70–99)
Glucose-Capillary: 131 mg/dL — ABNORMAL HIGH (ref 70–99)
Glucose-Capillary: 170 mg/dL — ABNORMAL HIGH (ref 70–99)
Glucose-Capillary: 138 mg/dL — ABNORMAL HIGH (ref 70–99)

## 2018-02-13 NOTE — Progress Notes (Signed)
CSW continuing to follow for discharge to SNF. Authorization still not yet received for patient to admit to SNF. Per facility, they asked for additional information earlier today.   CSW to continue to follow.  Laveda Abbe, [place] Clinical Social Worker [phone]

## 2018-02-13 NOTE — Progress Notes (Signed)
Ashlea Dusing, CN was doing routine rounding on the floor and was checking on hall when I noticed that patient was sitting on the edge of the foot of the bed with his head and shoulders slumped down..I walked up behind him, and saw a needle in his hand with the cap off of it.  I immediately grabbed the needle from his hand and ran to the door as the patient tried to stop me.   Security was called and we reentered the patients room and found a vial of novolog insulin at his feet in a bag of his from home, as well as several needles.  This evidence was destroyed and the waste placed in the sharps container.   We rechecked the patients blood sugar and it was 180; will continue to monitor for signs and symptoms of hypoglycemia.  MD aware.

## 2018-02-13 NOTE — Plan of Care (Signed)
Patient stable, discussed POC with patient, agreeable with plan, denies question/concerns at this time.  

## 2018-02-13 NOTE — Progress Notes (Signed)
RT offered pt CPAP pt declined stating he is going home tomorrow and he doesn't want to wear because they are filled with germs. RT will continue to monitor.

## 2018-02-13 NOTE — Progress Notes (Signed)
For SNF today, no changes from DC summary as dictated by [facility] 10/31

## 2018-02-13 NOTE — Progress Notes (Signed)
Charge nurse Jemi  found pt with insulin syring without cap holding  While making routine round, Jemi took that syring from pt,  Security was called and rechecked pt's room for more syringes and other drugs, found couples of syringes and insulin, all those evidences was wasted in sharp container, MD notified, received new order for monitor for hypoglycemia for next few hour, pt's BS was 189 73mins after the incident and 131 after about 5 hours, pt seems and verbalizes of feeling fine and resting in a bed at this moment, and will continue to monitor.

## 2018-02-13 NOTE — Consult Note (Signed)
   [facility] CM Inpatient Consult   02/13/2018  CEDAR DITULLIO [birth year] [id number]  Patient reviewed on extreme high risk list for unplanned readmission score with [facility] Medicare for [place] [employer] services. Chart review reveals patient has less than 30 day readmission.  Patient is currently being recommended for skilled nursing facility.  Awaiting authorization.  Chart reveals per MD notes on history:  62 year old male patient with history of medical noncompliance, polysubstance abuse, and diabetes. Admitted 10/22 with hyperglycemia, diabetic ketoacidosis, dehydration and renal failure after a fall at home. Patient needs 24 hour supervision per therapy notes. Currently primary care provider is  Dr. Celso Amy and this office provides the transition of care follow up.    For questions contact:   [name], RN BSN [facility] Liaison  [phone] business mobile phone Toll free office [phone]

## 2018-02-13 NOTE — Clinical Social Work Placement (Signed)
CLINICAL SOCIAL WORK PLACEMENT  NOTE  Date:  02/13/2018  Patient Details  Name: IZAYAH MINER MRN: [id number] Date of Birth: December 04, 1955  Clinical Social Work is seeking post-discharge placement for this patient at the [place] level of care (*CSW will initial, date and re-position this form in  chart as items are completed):  Yes   Patient/family provided with [place] Work Department's list of facilities offering this level of care within the geographic area requested by the patient (or if unable, by the patient's family).  Yes   Patient/family informed of their freedom to choose among providers that offer the needed level of care, that participate in Medicare, Medicaid or managed care program needed by the patient, have an available bed and are willing to accept the patient.  Yes   Patient/family informed of [place]'s ownership interest in [facility] and [facility], as well as of the fact that they are under no obligation to receive care at these facilities.  PASRR submitted to EDS on       PASRR number received on 02/11/18     Existing PASRR number confirmed on       FL2 transmitted to all facilities in geographic area requested by pt/family on 02/11/18     FL2 transmitted to all facilities within larger geographic area on       Patient informed that his/her managed care company has contracts with or will negotiate with certain facilities, including the following:        Yes   Patient/family informed of bed offers received.  Patient chooses bed at [place] recommends and patient chooses bed at      Patient to be transferred [facility] on  .  Patient to be transferred to facility by       Patient family notified on   of transfer.  Name of family member notified:        PHYSICIAN       Additional Comment:    _______________________________________________ Geralynn Ochs, LCSW 02/13/2018, 4:56 PM

## 2018-02-13 NOTE — Care Management Important Message (Signed)
Important Message  Patient Details  Name: [name] MRN: [id number] Date of Birth: Jul 06, 1955   Medicare Important Message Given:  Yes    Artemio Dobie P Menna Abeln 02/13/2018, 5:00 PM

## 2018-02-14 LAB — GLUCOSE, CAPILLARY
GLUCOSE-CAPILLARY: 221 mg/dL — AB (ref 70–99)
GLUCOSE-CAPILLARY: 212 mg/dL — AB (ref 70–99)
Glucose-Capillary: 367 mg/dL — ABNORMAL HIGH (ref 70–99)
Glucose-Capillary: 121 mg/dL — ABNORMAL HIGH (ref 70–99)
Glucose-Capillary: 181 mg/dL — ABNORMAL HIGH (ref 70–99)

## 2018-02-14 MED ORDER — LORAZEPAM 2 MG/ML IJ SOLN
0.5000 mg | INTRAMUSCULAR | Status: DC | PRN
  Administered 2018-02-14 – 2018-02-16 (×6): 0.5 mg via INTRAMUSCULAR
  Filled 2018-02-14 (×6): qty 1

## 2018-02-14 NOTE — Progress Notes (Signed)
Pt seen no changes, CBGs stable, physical debility Awaiting Insurance authorization for SNF  Domenic Polite, MD

## 2018-02-15 LAB — GLUCOSE, CAPILLARY
GLUCOSE-CAPILLARY: 307 mg/dL — AB (ref 70–99)
GLUCOSE-CAPILLARY: 117 mg/dL — AB (ref 70–99)
Glucose-Capillary: 214 mg/dL — ABNORMAL HIGH (ref 70–99)
Glucose-Capillary: 202 mg/dL — ABNORMAL HIGH (ref 70–99)

## 2018-02-15 NOTE — Progress Notes (Signed)
CSW contacted facility for disposition. Facility will have C pap for pt on Monday morning. Pt will not be able to discharge without C pap.  Physician has been updated.  Reed Breech LCSWA [phone]

## 2018-02-15 NOTE — Plan of Care (Signed)
Patient stable, discussed POC with patient, agreeable with plan, denies question/concerns at this time.  

## 2018-02-15 NOTE — Clinical Social Work Placement (Addendum)
   CLINICAL SOCIAL WORK PLACEMENT  NOTE  Date:  02/15/2018  Patient Details  Name: [name] MRN: [id number] Date of Birth: [birth year]  Clinical Social Work is seeking post-discharge placement for this patient at the [place] level of care (*CSW will initial, date and re-position this form in  chart as items are completed):  Yes   Patient/family provided with [place] Work Department's list of facilities offering this level of care within the geographic area requested by the patient (or if unable, by the patient's family).  Yes   Patient/family informed of their freedom to choose among providers that offer the needed level of care, that participate in Medicare, Medicaid or managed care program needed by the patient, have an available bed and are willing to accept the patient.  Yes   Patient/family informed of [place]'s ownership interest in [facility] and [facility], as well as of the fact that they are under no obligation to receive care at these facilities.  PASRR submitted to EDS on       PASRR number received on 02/11/18     Existing PASRR number confirmed on       FL2 transmitted to all facilities in geographic area requested by pt/family on 02/11/18     FL2 transmitted to all facilities within larger geographic area on       Patient informed that his/her managed care company has contracts with or will negotiate with certain facilities, including the following:        Yes   Patient/family informed of bed offers received.  Patient chooses bed at [place] recommends and patient chooses bed at      Patient to be transferred to [facility] on 02/15/18.  Patient to be transferred to facility by [facility]     Patient family notified on 02/15/18 of transfer.  Name of family member notified:  Only number on file is for pt's mother, CSW attempted however that number is disconnected. Pt  is aware he is d/c today to [place].   PHYSICIAN       Additional Comment:    _______________________________________________ Eileen Stanford, LCSW 02/15/2018, 12:37 PM

## 2018-02-15 NOTE — Clinical Social Work Note (Signed)
Clinical Social Worker facilitated patient discharge including contacting patient family and facility to confirm patient discharge plans.  Clinical information faxed to facility and family agreeable with plan.  CSW arranged ambulance transport via [facility] to [place].  RN to call [phone] for report prior to discharge.  Clinical Social Worker will sign off for now as social work intervention is no longer needed. Please consult [place] again if new need arises.  [place], [place]

## 2018-02-15 NOTE — Progress Notes (Signed)
Pt seen and examined No changes from DC summary done by Dr.Akula on 10/31  Domenic Polite, MD

## 2018-02-15 NOTE — Progress Notes (Signed)
Asked patient about wearing CPAP tonight, patient stated he does not wear one at home and is not interested in wearing one here.

## 2018-02-16 ENCOUNTER — Inpatient Hospital Stay (HOSPITAL_COMMUNITY): Payer: Medicare HMO

## 2018-02-16 DIAGNOSIS — R1310 Dysphagia, unspecified: Secondary | ICD-10-CM | POA: Diagnosis not present

## 2018-02-16 DIAGNOSIS — G9341 Metabolic encephalopathy: Secondary | ICD-10-CM | POA: Diagnosis not present

## 2018-02-16 DIAGNOSIS — R279 Unspecified lack of coordination: Secondary | ICD-10-CM | POA: Diagnosis not present

## 2018-02-16 DIAGNOSIS — W1839XA Other fall on same level, initial encounter: Secondary | ICD-10-CM | POA: Diagnosis not present

## 2018-02-16 DIAGNOSIS — F17210 Nicotine dependence, cigarettes, uncomplicated: Secondary | ICD-10-CM | POA: Diagnosis not present

## 2018-02-16 DIAGNOSIS — Z794 Long term (current) use of insulin: Secondary | ICD-10-CM | POA: Diagnosis not present

## 2018-02-16 DIAGNOSIS — R2681 Unsteadiness on feet: Secondary | ICD-10-CM | POA: Diagnosis not present

## 2018-02-16 DIAGNOSIS — Z9189 Other specified personal risk factors, not elsewhere classified: Secondary | ICD-10-CM | POA: Diagnosis not present

## 2018-02-16 DIAGNOSIS — M6281 Muscle weakness (generalized): Secondary | ICD-10-CM | POA: Diagnosis not present

## 2018-02-16 DIAGNOSIS — R1312 Dysphagia, oropharyngeal phase: Secondary | ICD-10-CM | POA: Diagnosis not present

## 2018-02-16 DIAGNOSIS — Z7982 Long term (current) use of aspirin: Secondary | ICD-10-CM | POA: Diagnosis not present

## 2018-02-16 DIAGNOSIS — I21A1 Myocardial infarction type 2: Secondary | ICD-10-CM | POA: Diagnosis not present

## 2018-02-16 DIAGNOSIS — Z23 Encounter for immunization: Secondary | ICD-10-CM | POA: Diagnosis not present

## 2018-02-16 DIAGNOSIS — R739 Hyperglycemia, unspecified: Secondary | ICD-10-CM | POA: Diagnosis present

## 2018-02-16 DIAGNOSIS — E1165 Type 2 diabetes mellitus with hyperglycemia: Secondary | ICD-10-CM | POA: Diagnosis not present

## 2018-02-16 DIAGNOSIS — R262 Difficulty in walking, not elsewhere classified: Secondary | ICD-10-CM | POA: Diagnosis not present

## 2018-02-16 DIAGNOSIS — F1010 Alcohol abuse, uncomplicated: Secondary | ICD-10-CM | POA: Diagnosis not present

## 2018-02-16 DIAGNOSIS — R0902 Hypoxemia: Secondary | ICD-10-CM | POA: Diagnosis not present

## 2018-02-16 DIAGNOSIS — J449 Chronic obstructive pulmonary disease, unspecified: Secondary | ICD-10-CM | POA: Diagnosis not present

## 2018-02-16 DIAGNOSIS — E1010 Type 1 diabetes mellitus with ketoacidosis without coma: Secondary | ICD-10-CM | POA: Diagnosis not present

## 2018-02-16 DIAGNOSIS — E860 Dehydration: Secondary | ICD-10-CM | POA: Diagnosis not present

## 2018-02-16 DIAGNOSIS — Z743 Need for continuous supervision: Secondary | ICD-10-CM | POA: Diagnosis not present

## 2018-02-16 DIAGNOSIS — I10 Essential (primary) hypertension: Secondary | ICD-10-CM | POA: Diagnosis not present

## 2018-02-16 DIAGNOSIS — J690 Pneumonitis due to inhalation of food and vomit: Secondary | ICD-10-CM | POA: Diagnosis not present

## 2018-02-16 DIAGNOSIS — E1042 Type 1 diabetes mellitus with diabetic polyneuropathy: Secondary | ICD-10-CM | POA: Diagnosis not present

## 2018-02-16 DIAGNOSIS — E1065 Type 1 diabetes mellitus with hyperglycemia: Secondary | ICD-10-CM | POA: Diagnosis not present

## 2018-02-16 DIAGNOSIS — K219 Gastro-esophageal reflux disease without esophagitis: Secondary | ICD-10-CM | POA: Diagnosis not present

## 2018-02-16 DIAGNOSIS — E113599 Type 2 diabetes mellitus with proliferative diabetic retinopathy without macular edema, unspecified eye: Secondary | ICD-10-CM | POA: Diagnosis not present

## 2018-02-16 LAB — GLUCOSE, CAPILLARY
GLUCOSE-CAPILLARY: 121 mg/dL — AB (ref 70–99)
GLUCOSE-CAPILLARY: 359 mg/dL — AB (ref 70–99)

## 2018-02-16 NOTE — Progress Notes (Signed)
Patient off floor for procedure 

## 2018-02-16 NOTE — Progress Notes (Signed)
This morning while performing assessment patient started to [facility]. While getting vital at 1236 patient started feeling all over my arm. I just left room on both occasions.

## 2018-02-16 NOTE — Progress Notes (Signed)
Discharge to: [facility] Anticipated discharge date: 02/16/18 Transportation by: [facility]  Report #: [phone], Room 50  [place] signing off.  Laveda Abbe LCSW [phone]

## 2018-02-16 NOTE — Plan of Care (Signed)
Adequate for discharge.

## 2018-02-16 NOTE — Plan of Care (Signed)
Progressing towards discharge 

## 2018-02-16 NOTE — Progress Notes (Signed)
No changes from DC summary done by Dr.Akula on 10/31 except, s/p MBS today per Speech therapy, recommending regular Carb modified diet with thin liquids despite aspiration risk, especially given pts poor compliance, needs SLP follow up at [facility]  Domenic Polite, MD

## 2018-02-16 NOTE — Progress Notes (Signed)
Physical Therapy Treatment Patient Details Name: [name] MRN: [id number] DOB: [birth year] Today's Date: 02/16/2018    History of Present Illness Pt is a 62 y/o male with history of medical noncompliance, polysubstance abuse, and diabetes.  Admitted 10/22 with hyperglycemia, diabetic ketoacidosis, dehydration and renal failure after a fall at home.  Initially was alert and oriented but became more encephalopathic overnight. Troponins were elevated and Cardiology was consulted and contributed that to demand ischemia.     PT Comments    Patient's tolerance to treatment today was fair.  Patient continues to be functionally limited due to poor strength, endurance, balance, and activity tolerance.  Patient required VC throughout session for safety awareness particularly during gait for proper placement of RW.  Patient appeared to be functionally limited during gait with noted limping and reports of dizziness that was monitored by therapist.  Patient required seated rest in recliner chair and was returned to room at end of session.  Patient would continue to benefit from acute care PT prior to recommended SNF destination in order to address strength, balance, and activity tolerance impairments.  Follow Up Recommendations  SNF;Supervision/Assistance - 24 hour     Equipment Recommendations  None recommended by PT    Recommendations for Other Services       Precautions / Restrictions Precautions Precautions: Fall Precaution Comments: impaired postural control in sitting, hands on for sitting balance Restrictions Weight Bearing Restrictions: No    Mobility  Bed Mobility Overal bed mobility: Needs Assistance Bed Mobility: Supine to Sit     Supine to sit: Min guard     General bed mobility comments: Patient attempted to exit bed quickly and required verbal cues to slow down in order for therapist to untangle sheets and don pt's gown.  Continues to have poor safety  awareness.  Transfers Overall transfer level: Needs assistance Equipment used: Rolling walker (2 wheeled) Transfers: Sit to/from Stand Sit to Stand: Min assist;Mod assist(min A from EOB; mod A from commode due to lower height)         General transfer comment: VC for scooting to edge of bed and hand placement prior to standing to RW.  VC to minimize impulsiveness and to maintain safety. VC for hand placement on R bathroom rail and assistance to boost into standing from commode.  Ambulation/Gait Ambulation/Gait assistance: Mod assist Gait Distance (Feet): 40 Feet(15 ft during 2nd trial) Assistive device: Rolling walker (2 wheeled) Gait Pattern/deviations: Step-through pattern;Step-to pattern;Decreased stride length;Trunk flexed;Drifts right/left Gait velocity: Decreased   General Gait Details: Pt initially demonstrated step through pattern with gait requiring VC for safety and correct placement of RW.  Pt demonstrated decreased stride length with noted limping throughout session.  Pt reported mild dizziness with noted truncal swaying that was monitored by therapist.  Patient became fatigued as gait progressed demonstrating step to pattern and increased limping.  Patient required seated rest in recliner chair and was returned to room.   Stairs             Wheelchair Mobility    Modified Rankin (Stroke Patients Only)       Balance Overall balance assessment: Needs assistance Sitting-balance support: Bilateral upper extremity supported;Feet supported Sitting balance-Leahy Scale: Fair Sitting balance - Comments: requires B UE support in sitting   Standing balance support: Bilateral upper extremity supported;During functional activity Standing balance-Leahy Scale: Poor Standing balance comment: reliant on UE support  Cognition Arousal/Alertness: Awake/alert Behavior During Therapy: Impulsive;Anxious Overall Cognitive Status: Within  Functional Limits for tasks assessed Area of Impairment: Safety/judgement;Following commands;Awareness                   Current Attention Level: Focused   Following Commands: Follows one step commands inconsistently Safety/Judgement: Decreased awareness of safety     General Comments: agitated and persistent to use the bathroom prior to therapy      Exercises      General Comments        Pertinent Vitals/Pain Pain Assessment: 0-10 Pain Score: 8  Pain Location: Left Leg Pain Descriptors / Indicators: Grimacing Pain Intervention(s): Monitored during session;Limited activity within patient's tolerance    Home Living                      Prior Function            PT Goals (current goals can now be found in the care plan section) Acute Rehab PT Goals Patient Stated Goal: none stated PT Goal Formulation: With patient Time For Goal Achievement: 02/21/18 Potential to Achieve Goals: Good Progress towards PT goals: Progressing toward goals    Frequency    Min 3X/week      PT Plan Current plan remains appropriate    Co-evaluation              AM-PAC PT "6 Clicks" Daily Activity  Outcome Measure  Difficulty turning over in bed (including adjusting bedclothes, sheets and blankets)?: None Difficulty moving from lying on back to sitting on the side of the bed? : A Little Difficulty sitting down on and standing up from a chair with arms (e.g., wheelchair, bedside commode, etc,.)?: A Little Help needed moving to and from a bed to chair (including a wheelchair)?: A Little Help needed walking in hospital room?: A Little Help needed climbing 3-5 steps with a railing? : A Lot 6 Click Score: 18    End of Session Equipment Utilized During Treatment: Gait belt Activity Tolerance: Patient limited by fatigue;Patient limited by pain Patient left: in chair;with call bell/phone within reach;with chair alarm set Nurse Communication: Mobility status PT  Visit Diagnosis: Unsteadiness on feet (R26.81);Other abnormalities of gait and mobility (R26.89);Muscle weakness (generalized) (M62.81);History of falling (Z91.81)     Time: 4287-6811 PT Time Calculation (min) (ACUTE ONLY): 23 min  Charges:  $Gait Training: 8-22 mins $Therapeutic Activity: 8-22 mins                     [address] SPTA   [name] 02/16/2018, 2:04 PM

## 2018-02-16 NOTE — Progress Notes (Signed)
PT walked patient down the hall from room 38W to around 34W and had to sit in chair. Pt currently sitting in chair no distress noted. Alarm on

## 2018-02-16 NOTE — Progress Notes (Signed)
Modified Barium Swallow Progress Note  Patient Details  Name: DHILLON COMUNALE MRN: [id number] Date of Birth: January 03, 1956  Today's Date: 02/16/2018  Modified Barium Swallow completed.  Full report located under Chart Review in the Imaging Section.  Brief recommendations include the following:  Clinical Impression  Pt demonstrates swallow function essentially unchanged from prior assessment. Oral transit and swallow response is generally discoordinated with premature spillage and piecemeal swallowing both resulting in silent and sensed aspiration of liquids pooled in pyriforms before and after the swallow. The more SLP intervened to control bolus size/rate of intake the more poorly coordinated pts function. Best swallows were with pt led, self fed continuous rapid intake with a straw; pt was more automatic and aspiration did not occur in this instance. Overall, pt has refused thickened liquids and would be expected to have ongoing risk of aspirating nectar as well as thin over the long term as well as additional risk of dehydration. Would recommend pt resume a regular diet and thin liquids with SLP f/u at next level of care. Discussed with MD   Swallow Evaluation Recommendations       SLP Diet Recommendations: Regular solids;Thin liquid   Liquid Administration via: Cup;Straw   Medication Administration: Whole meds with puree   Supervision: Patient able to self feed;Full supervision/cueing for compensatory strategies   Compensations: Slow rate;Minimize environmental distractions;Clear throat intermittently;Hard cough after swallow   Postural Changes: Remain semi-upright after after feeds/meals (Comment)   Oral Care Recommendations: Oral care BID        Adylin Hankey, Katherene Ponto 02/16/2018,2:10 PM

## 2018-02-17 ENCOUNTER — Telehealth: Payer: Self-pay | Admitting: *Deleted

## 2018-02-17 ENCOUNTER — Encounter: Payer: Self-pay | Admitting: Internal Medicine

## 2018-02-17 ENCOUNTER — Non-Acute Institutional Stay (SKILLED_NURSING_FACILITY): Payer: Medicare HMO | Admitting: Internal Medicine

## 2018-02-17 DIAGNOSIS — Z9189 Other specified personal risk factors, not elsewhere classified: Secondary | ICD-10-CM | POA: Diagnosis not present

## 2018-02-17 DIAGNOSIS — G9341 Metabolic encephalopathy: Secondary | ICD-10-CM

## 2018-02-17 DIAGNOSIS — E1010 Type 1 diabetes mellitus with ketoacidosis without coma: Secondary | ICD-10-CM

## 2018-02-17 NOTE — Telephone Encounter (Signed)
Pt was on TCM report admitted 10/22 with hyperglycemia, diabetic ketoacidosis, dehydration and renal failure after a fall at home. became progressively encephalopathic overnight requiring escalating Lorazepam critical care asked to see given concern about mental status, airway protection, and critical care services. Patient was briefly on Precedex drip and was weaned off and he was transferred to [facility] on 10/26. Pt was D/c 02/16/18, and sent to SNF per summary will need to see PCP 1 week after discharge from SNF.Marland KitchenJohny [name]
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
## 2018-02-17 NOTE — Assessment & Plan Note (Addendum)
His main complaint of pain relates to the buttocks lesions.  Wound care has been ordered. Opiates will not be ordered

## 2018-02-17 NOTE — Assessment & Plan Note (Addendum)
Intensive sliding scale insulin is not realistic either here at the SNF or in his outpatient environment.  NPH twice daily will be initiated. Schedule F/U with Dr Loanne Drilling

## 2018-02-18 NOTE — Patient Instructions (Signed)
See assessment and plan under each diagnosis in the problem list and acutely for this visit 

## 2018-02-26 ENCOUNTER — Encounter (HOSPITAL_COMMUNITY): Payer: Self-pay | Admitting: Emergency Medicine

## 2018-02-26 ENCOUNTER — Telehealth: Payer: Self-pay | Admitting: Endocrinology

## 2018-02-26 ENCOUNTER — Emergency Department (HOSPITAL_COMMUNITY)
Admission: EM | Admit: 2018-02-26 | Discharge: 2018-02-26 | Disposition: A | Discharge status: Home / Self Care | Payer: Medicare HMO | Attending: Emergency Medicine | Admitting: Emergency Medicine

## 2018-02-26 DIAGNOSIS — R739 Hyperglycemia, unspecified: Secondary | ICD-10-CM

## 2018-02-26 DIAGNOSIS — J449 Chronic obstructive pulmonary disease, unspecified: Secondary | ICD-10-CM | POA: Insufficient documentation

## 2018-02-26 DIAGNOSIS — E1065 Type 1 diabetes mellitus with hyperglycemia: Secondary | ICD-10-CM | POA: Diagnosis not present

## 2018-02-26 DIAGNOSIS — I10 Essential (primary) hypertension: Secondary | ICD-10-CM | POA: Insufficient documentation

## 2018-02-26 DIAGNOSIS — F17210 Nicotine dependence, cigarettes, uncomplicated: Secondary | ICD-10-CM | POA: Diagnosis not present

## 2018-02-26 DIAGNOSIS — Z7982 Long term (current) use of aspirin: Secondary | ICD-10-CM | POA: Insufficient documentation

## 2018-02-26 DIAGNOSIS — E860 Dehydration: Secondary | ICD-10-CM | POA: Diagnosis not present

## 2018-02-26 DIAGNOSIS — Z794 Long term (current) use of insulin: Secondary | ICD-10-CM | POA: Insufficient documentation

## 2018-02-26 LAB — URINALYSIS, ROUTINE W REFLEX MICROSCOPIC
BILIRUBIN URINE: NEGATIVE
Bacteria, UA: NONE SEEN
Hgb urine dipstick: NEGATIVE
KETONES UR: NEGATIVE mg/dL
LEUKOCYTES UA: NEGATIVE
Nitrite: NEGATIVE
PH: 5 (ref 5.0–8.0)
Protein, ur: 100 mg/dL — AB
SPECIFIC GRAVITY, URINE: 1.016 (ref 1.005–1.030)

## 2018-02-26 LAB — BASIC METABOLIC PANEL
ANION GAP: 8 (ref 5–15)
BUN: 48 mg/dL — AB (ref 8–23)
CO2: 25 mmol/L (ref 22–32)
Calcium: 8.8 mg/dL — ABNORMAL LOW (ref 8.9–10.3)
Chloride: 101 mmol/L (ref 98–111)
Creatinine, Ser: 1.33 mg/dL — ABNORMAL HIGH (ref 0.61–1.24)
GFR calc Af Amer: >60 mL/min (ref >60)
GFR, EST NON AFRICAN AMERICAN: 56 mL/min — AB (ref >60)
GLUCOSE: 325 mg/dL — AB (ref 70–99)
Potassium: 4.3 mmol/L (ref 3.5–5.1)
Sodium: 134 mmol/L — ABNORMAL LOW (ref 135–145)

## 2018-02-26 LAB — CBC
HCT: 25.7 % — ABNORMAL LOW (ref 39.0–52.0)
Hemoglobin: 8.4 g/dL — ABNORMAL LOW (ref 13.0–17.0)
MCH: 31.2 pg (ref 26.0–34.0)
MCHC: 32.7 g/dL (ref 30.0–36.0)
MCV: 95.5 fL (ref 80.0–100.0)
NRBC: 0 % (ref 0.0–0.2)
PLATELETS: 365 10*3/uL (ref 150–400)
RBC: 2.69 MIL/uL — ABNORMAL LOW (ref 4.22–5.81)
RDW: 14.1 % (ref 11.5–15.5)
WBC: 9.9 10*3/uL (ref 4.0–10.5)

## 2018-02-26 LAB — BLOOD GAS, VENOUS
Acid-Base Excess: 1.5 mmol/L (ref 0.0–2.0)
Bicarbonate: 25 mmol/L (ref 20.0–28.0)
O2 Saturation: 98.4 %
PCO2 VEN: 36.7 mmHg — AB (ref 44.0–60.0)
Patient temperature: 98.6
pH, Ven: 7.448 — ABNORMAL HIGH (ref 7.250–7.430)
pO2, Ven: 108 mmHg — ABNORMAL HIGH (ref 32.0–45.0)

## 2018-02-26 LAB — CBG MONITORING, ED: Glucose-Capillary: 356 mg/dL — ABNORMAL HIGH (ref 70–99)

## 2018-02-26 MED ORDER — SODIUM CHLORIDE 0.9 % IV BOLUS
1000.0000 mL | Freq: Once | INTRAVENOUS | Status: AC
  Administered 2018-02-26: 1000 mL via INTRAVENOUS

## 2018-02-26 NOTE — Telephone Encounter (Signed)
Patient is currently calling stating they are at rehab facility called [facility] and wanted to let Dr. Loanne Drilling know.

## 2018-02-26 NOTE — Telephone Encounter (Signed)
FYI

## 2018-02-26 NOTE — ED Provider Notes (Signed)
[facility] DEPT Provider Note   CSN: [id number] Arrival date & time: 02/26/18  2003     History   Chief Complaint Chief Complaint  Patient presents with  . Hyperglycemia    HPI [name] is a 62 y.o. male.  HPI Patient presented to the emergency room for evaluation of hyperglycemia.  Patient is at [facility] living in rehab.  He had his blood sugar checked this evening it was 509.  According to the EMS report the patient was given 20 units of insulin and then transferred ported to the emergency room for further evaluation.  Patient denies any trouble with nausea or vomiting.  He has not had any fevers or chills.  He has noticed some increased urination. Past Medical History:  Diagnosis Date  . DEPRESSION   . DIABETES MELLITUS, TYPE I   . DRUG ABUSE    pt should have NO controlled substances rx'ed  . Glaucoma   . HEPATITIS C    chronic  . Hypertension 02/18/2011  . 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 Vitiligo     Patient Active Problem List   Diagnosis Date Noted  . At risk for adverse drug event 02/17/2018  . Abrasions of multiple sites   . Demand ischemia of myocardium ([place]) 02/05/2018  . Acute metabolic encephalopathy 32/35/5732  . DKA (diabetic ketoacidoses) ([place]) 02/03/2018  . Leukocytosis 02/03/2018  . Hypoglycemia 12/24/2017  . Syncope 12/24/2017  . Syncope and collapse 12/23/2017  . IV drug abuse ([place]) 04/19/2017  . Acute encephalopathy 04/19/2017  . Hemangioma 10/02/2016  . Foreign body (FB) in soft tissue   . 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[place])   . Gangrene ([place])   . Type 1 diabetes mellitus with hyperosmolarity without nonketotic hyperglycemic hyperosmolar coma ([place])   . Tobacco abuse   . Essential hypertension   . Diabetic infection of right foot ([place]) 07/22/2016  . Diabetic foot infection ([place]) 07/22/2016  . Chronic ulcer of heel, right, with fat layer exposed ([place]) 05/20/2016  . DKA, type 1 ([place])  04/18/2016  . Acute kidney injury ([place]) 04/18/2016  . Hyponatremia 04/18/2016  . Severe protein-calorie malnutrition ([place]) 04/18/2016  . Elevated troponin 04/18/2016  . Poor dentition 07/12/2015  . Tobacco use 07/06/2013  . PAD (peripheral artery disease) ([place]) 04/29/2013  . COPD, mild ([place])   . 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 ([place]) 11/18/2011  . Diabetic neuropathy ([place]) 06/21/2010  . PROLIFERATIVE DIABETIC RETINOPATHY 06/21/2010  . SKIN TAG 01/30/2010  . Chronic hepatitis C ([place]) 11/22/2008  . VITILIGO 11/22/2008  . PROTEINURIA, MILD 11/22/2008  . Substance abuse ([place]) 04/23/2007  . DEPRESSION 11/11/2006    Past Surgical History:  Procedure Laterality Date  . ABDOMINAL AORTOGRAM W/LOWER EXTREMITY N/A 07/25/2016   Procedure: Abdominal Aortogram w/Bilateral Lower Extremity Runoff;  Surgeon: Conrad St. Paris, MD;  Location: [facility];  Service: Cardiovascular;  Laterality: N/A;  . ABDOMINAL AORTOGRAM W/LOWER EXTREMITY N/A 12/24/2016   Procedure: ABDOMINAL AORTOGRAM W/LOWER EXTREMITY;  Surgeon: Serafina Mitchell, MD;  Location: [facility];  Service: Cardiovascular;  Laterality: N/A;  . AMPUTATION TOE Right 07/26/2016   Procedure: AMPUTATION GREAT TOE;  Surgeon: Serafina Mitchell, MD;  Location: [facility];  Service: Vascular;  Laterality: Right;  . EYE SURGERY     retinal surgery x 2, right eye  . INSERTION OF ILIAC STENT Right 07/26/2016   Procedure: INSERTION OF RIGHT POPLITEAL STENT WITH BALLOON ANGIOPLASTY;  Surgeon: Durene Fruits  Pierre Bali, MD;  Location: [facility];  Service: Vascular;  Laterality: Right;  . LOWER EXTREMITY ANGIOGRAM Right 07/26/2016   Procedure: LOWER EXTREMITY ANGIOGRAM;  Surgeon: Serafina Mitchell, MD;  Location: [facility];  Service: Vascular;  Laterality: Right;  . PERIPHERAL VASCULAR BALLOON ANGIOPLASTY Right 07/26/2016   Procedure: PERIPHERAL VASCULAR BALLOON ANGIOPLASTY  RIGHT ANTERIOR TIBEAL ARTERY AND RIGHT SUPERFICIAL FEMORAL ARTERY;   Surgeon: Serafina Mitchell, MD;  Location: [facility];  Service: Vascular;  Laterality: Right;  . PERIPHERAL VASCULAR INTERVENTION Right 12/24/2016   Procedure: PERIPHERAL VASCULAR INTERVENTION;  Surgeon: Serafina Mitchell, MD;  Location: [facility];  Service: Cardiovascular;  Laterality: Right;  lower extr        Home Medications    Prior to Admission medications   Medication Sig Start Date End Date Taking? Authorizing Provider  amLODipine (NORVASC) 10 MG tablet Take 1 tablet (10 mg total) by mouth daily. 02/01/18 03/03/18 Yes Lady Deutscher, MD  aspirin 81 MG chewable tablet Chew 1 tablet (81 mg total) by mouth daily. 02/12/18  Yes Hosie Poisson, MD  benzonatate (TESSALON) 100 MG capsule Take 200 mg by mouth 3 (three) times daily as needed for cough.   Yes [provider]  bisacodyl (DULCOLAX) 10 MG suppository Place 10 mg rectally daily as needed for moderate constipation.   Yes [provider]  folic acid (FOLVITE) 1 MG tablet Take 1 tablet (1 mg total) by mouth daily. 02/01/18 03/03/18 Yes Lady Deutscher, MD  gabapentin (NEURONTIN) 300 MG capsule Take 300 mg by mouth 4 (four) times daily.   Yes [provider]  hydrALAZINE (APRESOLINE) 25 MG tablet Take 1 tablet (25 mg total) by mouth every 8 (eight) hours. 02/11/18  Yes Hosie Poisson, MD  hydrocortisone (ANUSOL-HC) 2.5 % rectal cream Apply rectally 2 times daily 02/12/18 02/12/19 Yes Hosie Poisson, MD  insulin aspart (NOVOLOG) 100 UNIT/ML injection Inject 12-20 Units into the skin 2 (two) times daily. 100-200=12 units, 201-300=15 units, >300=20 units.   Yes [provider]  magnesium hydroxide (MILK OF MAGNESIA) 400 MG/5ML suspension Take 30 mLs by mouth daily as needed for mild constipation.   Yes [provider]  metoCLOPramide (REGLAN) 5 MG tablet Take 1 tablet (5 mg total) by mouth 3 (three) times daily. 02/11/18 02/11/19 Yes Hosie Poisson, MD  Multiple Vitamin (MULTIVITAMIN WITH  MINERALS) TABS tablet Take 1 tablet by mouth daily. 02/01/18 05/02/18 Yes Lady Deutscher, MD  pantoprazole (PROTONIX) 40 MG tablet Take 1 tablet (40 mg total) by mouth daily at 6 (six) AM. 02/12/18  Yes Hosie Poisson, MD  Sodium Phosphates (RA SALINE ENEMA) 19-7 GM/118ML ENEM Place 118 mLs rectally daily as needed (constipation).   Yes [provider]  thiamine 100 MG tablet Take 1 tablet (100 mg total) by mouth daily. 02/01/18 05/02/18 Yes Lady Deutscher, MD  benzonatate (TESSALON) 200 MG capsule Take 1 capsule (200 mg total) by mouth 3 (three) times daily as needed for cough. Patient not taking: Reported on 02/26/2018 02/11/18   Hosie Poisson, MD  blood glucose meter kit and supplies KIT Dispense based on patient and insurance preference. Use up to four times daily as directed. (FOR ICD-9 250.00, 250.01). Patient not taking: Reported on 02/03/2018 12/24/17   Elgergawy, Silver Huguenin, MD  gabapentin (NEURONTIN) 600 MG tablet Take 0.5 tablets (300 mg total) by mouth 3 (three) times daily. Patient not taking: Reported on 02/26/2018 02/11/18   Hosie Poisson, MD  insulin aspart (NOVOLOG) 100  UNIT/ML injection CBG 70 - 120: 0 units CBG 121 - 150: 3 units CBG 151 - 200: 4 units CBG 201 - 250: 7 units CBG 251 - 300: 11 units CBG 301 - 350: 15 units CBG 351 - 400: 20 units Patient not taking: Reported on 02/26/2018 02/11/18   Hosie Poisson, MD  insulin glargine (LANTUS) 100 UNIT/ML injection Inject 0.25 mLs (25 Units total) into the skin daily. 02/12/18   Hosie Poisson, MD    Family History Family History  Problem Relation Age of Onset  . Diabetes Father   . Kidney disease Father   . Arthritis Mother   . Heart disease Mother        CAD    Social History Social History   Tobacco Use  . Smoking status: Current Every Day Smoker    Packs/day: 0.50    Types: Cigarettes  . Smokeless tobacco: Never Used  Substance Use Topics  . Alcohol use: No    Alcohol/week: 0.0 standard drinks      Comment: 08/14/14 none  . Drug use: Yes    Types: "Crack" cocaine, Marijuana, Heroin    Comment: s/p rehab x 6, occasional drug use (per pt last  use was 06/2015)     Allergies   Codeine   Review of Systems Review of Systems  All other systems reviewed and are negative.    Physical Exam Updated Vital Signs BP 118/62   Pulse 79   Resp 17   Ht 1.829 m (6')   Wt 67.6 kg   SpO2 97%   BMI 20.21 kg/m   Physical Exam  Constitutional: No distress.  HENT:  Head: Normocephalic and atraumatic.  Right Ear: External ear normal.  Left Ear: External ear normal.  Eyes: Conjunctivae are normal. Right eye exhibits no discharge. Left eye exhibits no discharge. No scleral icterus.  Neck: Neck supple. No tracheal deviation present.  Cardiovascular: Normal rate, regular rhythm and intact distal pulses.  Pulmonary/Chest: Effort normal and breath sounds normal. No stridor. No respiratory distress. He has no wheezes. He has no rales.  Abdominal: Soft. Bowel sounds are normal. He exhibits no distension. There is no tenderness. There is no rebound and no guarding.  Musculoskeletal: He exhibits no edema or tenderness.  Status post amputation right big toe, bilateral feet are warm and well-perfused, no erythema or cyanosis  Neurological: He is alert. He has normal strength. No cranial nerve deficit (no facial droop, extraocular movements intact, no slurred speech) or sensory deficit. He exhibits normal muscle tone. He displays no seizure activity. Coordination normal.  Skin: Skin is warm and dry. No rash noted. He is not diaphoretic.  Psychiatric: He has a normal mood and affect.  Nursing note and vitals reviewed.    ED Treatments / Results  Labs (all labs ordered are listed, but only abnormal results are displayed) Labs Reviewed  CBC - Abnormal; Notable for the following components:      Result Value   RBC 2.69 (*)    Hemoglobin 8.4 (*)    HCT 25.7 (*)    All other components within  normal limits  BASIC METABOLIC PANEL - Abnormal; Notable for the following components:   Sodium 134 (*)    Glucose, Bld 325 (*)    BUN 48 (*)    Creatinine, Ser 1.33 (*)    Calcium 8.8 (*)    GFR calc non Af Amer 56 (*)    All other components within normal limits  BLOOD GAS, VENOUS -  Abnormal; Notable for the following components:   pH, Ven 7.448 (*)    pCO2, Ven 36.7 (*)    pO2, Ven 108.0 (*)    All other components within normal limits  URINALYSIS, ROUTINE W REFLEX MICROSCOPIC - Abnormal; Notable for the following components:   Glucose, UA >=500 (*)    Protein, ur 100 (*)    All other components within normal limits  CBG MONITORING, ED - Abnormal; Notable for the following components:   Glucose-Capillary 356 (*)    All other components within normal limits    EKG None  Radiology No results found.  Procedures Procedures (including critical care time)  Medications Ordered in ED Medications  sodium chloride 0.9 % bolus 1,000 mL (1,000 mLs Intravenous New Bag/Given 02/26/18 2139)     Initial Impression / Assessment and Plan / ED Course  I have reviewed the triage vital signs and the nursing notes.  Pertinent labs & imaging results that were available during my care of the patient were reviewed by me and considered in my medical decision making (see chart for details).  Clinical Course as of Feb 26 2209  Thu Feb 26, 2018  2208 BUN and Cr elevated.  C/w dehydration.  Glucose elevated but no DKA.  UA without infection.  Anemia is stable   [JK]    Clinical Course User Index [JK] Dorie Rank, MD    Pt presented to the ED for evaluation of hyperglycemia.  ED workup shows dehydration and improving blood sugar.  No DKA. Most recent down in the 300s.  IV fluids given in the ED.  Pt appears stable for discharge back to the nursing facility.  Follow up with PCP regarding his insulin regimen.  Records reviewed and pt has been difficult to manage with his dietary non compliance  and behavioral difficulties.    Final Clinical Impressions(s) / ED Diagnoses   Final diagnoses:  Hyperglycemia  Dehydration    ED Discharge Orders    None       Dorie Rank, MD 02/26/18 2212

## 2018-02-26 NOTE — ED Notes (Signed)
[facility] called for transport.  

## 2018-02-26 NOTE — ED Notes (Signed)
Bed: WA03 Expected date:  Expected time:  Means of arrival:  Comments: hyperglycemia

## 2018-02-26 NOTE — Discharge Instructions (Addendum)
Continue your current medications, follow up with your doctor regarding your insulin regimen

## 2018-02-26 NOTE — ED Triage Notes (Signed)
-  Arrived with EMS, C/C hyperglycemia, reading at 509 per EMS, facility had given 20 units, unknown insulin -S/S increased urination  -From [facility] -Alert and Oriented x 4   Vitals  -BP 150/70 -P 85 -RR 22 -CBG 509  -Hx Hep C, HTN, hx of seizures and neuropathy, partial amputation of one of his feet, unspecified which foot, hx of IV drug use  -walks with a walker

## 2018-02-27 ENCOUNTER — Encounter: Payer: Self-pay | Admitting: Adult Health

## 2018-02-27 ENCOUNTER — Non-Acute Institutional Stay (SKILLED_NURSING_FACILITY): Payer: Medicare HMO | Admitting: Adult Health

## 2018-02-27 DIAGNOSIS — K219 Gastro-esophageal reflux disease without esophagitis: Secondary | ICD-10-CM

## 2018-02-27 DIAGNOSIS — R1310 Dysphagia, unspecified: Secondary | ICD-10-CM | POA: Diagnosis not present

## 2018-02-27 DIAGNOSIS — E1042 Type 1 diabetes mellitus with diabetic polyneuropathy: Secondary | ICD-10-CM

## 2018-02-27 DIAGNOSIS — I10 Essential (primary) hypertension: Secondary | ICD-10-CM | POA: Diagnosis not present

## 2018-02-27 DIAGNOSIS — F1010 Alcohol abuse, uncomplicated: Secondary | ICD-10-CM

## 2018-02-27 MED ORDER — AMLODIPINE BESYLATE 10 MG PO TABS: 10.0000 mg | ORAL_TABLET | Freq: Every day | ORAL | 0 refills | Status: DC

## 2018-02-27 MED ORDER — GABAPENTIN 300 MG PO CAPS: 300.0000 mg | ORAL_CAPSULE | Freq: Four times a day (QID) | ORAL | 0 refills | Status: DC

## 2018-02-27 MED ORDER — FOLIC ACID 1 MG PO TABS: 1.0000 mg | ORAL_TABLET | Freq: Every day | ORAL | 0 refills | Status: AC

## 2018-02-27 MED ORDER — ISOPROPYL ALCOHOL WIPES 70 % EX MISC: 1.0000 | Freq: Every day | CUTANEOUS | 0 refills | Status: DC | PRN

## 2018-02-27 MED ORDER — LANCETS MISC: 1.0000 | Freq: Every day | 0 refills | Status: DC | PRN

## 2018-02-27 MED ORDER — HYDRALAZINE HCL 25 MG PO TABS: 25.0000 mg | ORAL_TABLET | Freq: Three times a day (TID) | ORAL | 0 refills | Status: DC

## 2018-02-27 MED ORDER — METOCLOPRAMIDE HCL 5 MG PO TABS: 5.0000 mg | ORAL_TABLET | Freq: Three times a day (TID) | ORAL | 0 refills | Status: DC

## 2018-02-27 MED ORDER — BLOOD GLUCOSE MONITOR KIT: PACK | 0 refills | Status: DC

## 2018-02-27 MED ORDER — PANTOPRAZOLE SODIUM 40 MG PO TBEC: 40.0000 mg | DELAYED_RELEASE_TABLET | Freq: Every day | ORAL | 0 refills | Status: DC

## 2018-02-27 MED ORDER — BENZONATATE 200 MG PO CAPS: 200.0000 mg | ORAL_CAPSULE | Freq: Three times a day (TID) | ORAL | 0 refills | Status: DC | PRN

## 2018-02-27 NOTE — Progress Notes (Signed)
Location:  [facility] Room Number: 215-A Place of Service:  SNF (31) Provider:  Durenda Age, NP  Patient Care Team: [name], MD as PCP - General (Internal Medicine) Renato Shin, MD as Consulting Physician (Endocrinology)  Extended Emergency Contact Information Primary Emergency Contact: Dwana Melena Address: [address], [place] Phone: [phone] Relation: Mother  Code Status:  Full Code  Goals of care: Advanced Directive information Advanced Directives 02/26/2018  Does Patient Have a Medical Advance Directive? No  Type of Advance Directive -  Does patient want to make changes to medical advance directive? -  Would patient like information on creating a medical advance directive? No - Patient declined     Chief Complaint  Patient presents with  . Discharge Note    Patient to discharge home on 03/01/18    HPI:  Pt is a 61 y.o. male seen today for discharge.  He will discharge home on 03/01/18 with home health OT/ST/PT.    He was sent to ED last night for elevated blood sugars, 509. Work up showed dehydration and was given IV fluids and discharge back to facility. This morning, he went out of the facility and came back after breakfast and blood sugar was HI so he was given 20 units of Novolog. Re-check showed 306. It has been difficult to control blood sugars due to non compliance and behavioral issues.   He has a PMH of vitiligo, essential HTN, chronic hepatitis C, type 1 diabetes, substance abuse, tobacco abuse, and depression. He has been admitted to [facility] on 02/16/18 from a recent hospitalization due to hyperglycemia with diabetic ketoacidosis in the context of dehydration and renal failure after a fall at home.  He had encephalopathy and CT of the head and cervical spine were negative for acute injury or change.  Encephalopathy was thought to be secondary to  withdrawal symptoms from ETOH. He was given Precedex and then weaned off.   Patient was admitted to this facility for short-term rehabilitation after the patient's recent hospitalization.  Patient has completed SNF rehabilitation and therapy has cleared the patient for discharge.   Past Medical History:  Diagnosis Date  . DEPRESSION   . DIABETES MELLITUS, TYPE I   . DRUG ABUSE    pt should have NO controlled substances rx'ed  . Glaucoma   . HEPATITIS C    chronic  . Hypertension 02/18/2011  . 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 Vitiligo    Past Surgical History:  Procedure Laterality Date  . ABDOMINAL AORTOGRAM W/LOWER EXTREMITY N/A 07/25/2016   Procedure: Abdominal Aortogram w/Bilateral Lower Extremity Runoff;  Surgeon: Conrad Egypt, MD;  Location: [facility];  Service: Cardiovascular;  Laterality: N/A;  . ABDOMINAL AORTOGRAM W/LOWER EXTREMITY N/A 12/24/2016   Procedure: ABDOMINAL AORTOGRAM W/LOWER EXTREMITY;  Surgeon: Serafina Mitchell, MD;  Location: [facility];  Service: Cardiovascular;  Laterality: N/A;  . AMPUTATION TOE Right 07/26/2016   Procedure: AMPUTATION GREAT TOE;  Surgeon: Serafina Mitchell, MD;  Location: [facility];  Service: Vascular;  Laterality: Right;  . EYE SURGERY     retinal surgery x 2, right eye  . INSERTION OF ILIAC STENT Right 07/26/2016   Procedure: INSERTION OF RIGHT POPLITEAL STENT WITH BALLOON ANGIOPLASTY;  Surgeon: Serafina Mitchell, MD;  Location: [facility];  Service: Vascular;  Laterality: Right;  . LOWER EXTREMITY ANGIOGRAM Right 07/26/2016  Procedure: LOWER EXTREMITY ANGIOGRAM;  Surgeon: Serafina Mitchell, MD;  Location: [facility];  Service: Vascular;  Laterality: Right;  . PERIPHERAL VASCULAR BALLOON ANGIOPLASTY Right 07/26/2016   Procedure: PERIPHERAL VASCULAR BALLOON ANGIOPLASTY  RIGHT ANTERIOR TIBEAL ARTERY AND RIGHT SUPERFICIAL FEMORAL ARTERY;  Surgeon: Serafina Mitchell, MD;  Location: [facility];  Service: Vascular;  Laterality: Right;  .  PERIPHERAL VASCULAR INTERVENTION Right 12/24/2016   Procedure: PERIPHERAL VASCULAR INTERVENTION;  Surgeon: Serafina Mitchell, MD;  Location: [facility];  Service: Cardiovascular;  Laterality: Right;  lower extr    Allergies  Allergen Reactions  . Codeine Itching    Tolerates hydrocodone/apap    Outpatient Encounter Medications as of 02/27/2018  Medication Sig  . amLODipine (NORVASC) 10 MG tablet Take 1 tablet (10 mg total) by mouth daily.  [name] aspirin 81 MG chewable tablet Chew 1 tablet (81 mg total) by mouth daily.  . benzonatate (TESSALON) 100 MG capsule Take 200 mg by mouth 3 (three) times daily as needed for cough.  . benzonatate (TESSALON) 200 MG capsule Take 1 capsule (200 mg total) by mouth 3 (three) times daily as needed for cough.  . folic acid (FOLVITE) 1 MG tablet Take 1 tablet (1 mg total) by mouth daily.  [name] gabapentin (NEURONTIN) 300 MG capsule Take 300 mg by mouth 4 (four) times daily.  [name] gabapentin (NEURONTIN) 300 MG capsule Take 300 mg by mouth every 6 (six) hours.  . hydrALAZINE (APRESOLINE) 25 MG tablet Take 1 tablet (25 mg total) by mouth every 8 (eight) hours.  . hydrocortisone (ANUSOL-HC) 2.5 % rectal cream Apply rectally 2 times daily  . insulin aspart (NOVOLOG) 100 UNIT/ML injection CBG 70 - 120: 0 units CBG 121 - 150: 3 units CBG 151 - 200: 4 units CBG 201 - 250: 7 units CBG 251 - 300: 11 units CBG 301 - 350: 15 units CBG 351 - 400: 20 units  . insulin aspart (NOVOLOG) 100 UNIT/ML injection Inject 12-20 Units into the skin 2 (two) times daily. 100-200=12 units, 201-300=15 units, >300=20 units.  . insulin glargine (LANTUS) 100 UNIT/ML injection Inject 0.25 mLs (25 Units total) into the skin daily.  . magnesium hydroxide (MILK OF MAGNESIA) 400 MG/5ML suspension Take 30 mLs by mouth daily as needed for mild constipation.  . metoCLOPramide (REGLAN) 5 MG tablet Take 1 tablet (5 mg total) by mouth 3 (three) times daily.  . Multiple Vitamin (MULTIVITAMIN WITH  MINERALS) TABS tablet Take 1 tablet by mouth daily.  . pantoprazole (PROTONIX) 40 MG tablet Take 1 tablet (40 mg total) by mouth daily at 6 (six) AM.  . Sodium Phosphates (RA SALINE ENEMA) 19-7 GM/118ML ENEM Place 118 mLs rectally daily as needed (constipation).  . thiamine 100 MG tablet Take 1 tablet (100 mg total) by mouth daily.  . blood glucose meter kit and supplies KIT Dispense based on patient and insurance preference. Use up to four times daily as directed. (FOR ICD-9 250.00, 250.01). (Patient not taking: Reported on 02/03/2018)  . [DISCONTINUED] bisacodyl (DULCOLAX) 10 MG suppository Place 10 mg rectally daily as needed for moderate constipation.  . [DISCONTINUED] gabapentin (NEURONTIN) 600 MG tablet Take 0.5 tablets (300 mg total) by mouth 3 (three) times daily.   No facility-administered encounter medications on file as of 02/27/2018.     Review of Systems  GENERAL: No change in appetite, no fatigue, no weight changes, no fever, chills or weakness MOUTH and THROAT: Denies oral discomfort, gingival pain or bleeding RESPIRATORY: no cough,  SOB, DOE, wheezing, hemoptysis CARDIAC: No chest pain, edema or palpitations GI: No abdominal pain, diarrhea, constipation, heart burn, nausea or vomiting GU: Denies dysuria, frequency, hematuria, incontinence, or discharge PSYCHIATRIC: Denies feelings of depression or anxiety. No report of hallucinations, insomnia, paranoia, or agitation   Immunization History  Administered Date(s) Administered  . Hep A / Hep B 07/12/2015  . Hepatitis B, adult 08/14/2015  . Influenza Split 02/18/2011  . Influenza Whole 01/30/2010, 02/11/2013  . Influenza,inj,Quad PF,6+ Mos 02/11/2013, 12/08/2013, 01/12/2015, 01/15/2016, 01/07/2017, 02/09/2018  . Pneumococcal Conjugate-13 02/06/2015  . Pneumococcal Polysaccharide-23 04/19/2013  . Td 04/16/2005   Pertinent  Health Maintenance Due  Topic Date Due  . COLONOSCOPY  10/03/2006  . OPHTHALMOLOGY EXAM  03/07/2016   . FOOT EXAM  07/22/2018  . HEMOGLOBIN A1C  08/01/2018  . INFLUENZA VACCINE  Completed   Fall Risk  10/01/2016 08/28/2016 02/06/2015 08/04/2014  Falls in the past year? Yes No Yes No  Number falls in past yr: 1 - 1 -  Injury with Fall? Yes - Yes -  Risk Factor Category  High Fall Risk - High Fall Risk -  Risk for fall due to : History of fall(s);Impaired balance/gait;Medication side effect Impaired balance/gait Other (Comment) -  Risk for fall due to: Comment - - hypoglycemic event w/ syncope - broke nose (one event) -  Follow up Falls evaluation completed;Education provided;Falls prevention discussed - - -    Body mass index is 21.86 kg/m.  Physical Exam  GENERAL APPEARANCE: Well nourished. In no acute distress. Normal body habitus SKIN:  Skin is warm and dry.  MOUTH and THROAT: Lips are without lesions. Oral mucosa is moist and without lesions. Tongue is normal in shape, size, and color and without lesions RESPIRATORY: Breathing is even & unlabored, BS CTAB CARDIAC: RRR, no murmur,no extra heart sounds, no edema GI: Abdomen soft, normal BS, no masses, no tenderness EXTREMITIES:  Able to move X 4 extremities NEUROLOGICAL: There is no tremor. Speech is clear PSYCHIATRIC: Alert and oriented X 3. Affect and behavior are appropriate   Labs reviewed: Recent Labs    01/30/18 0922  02/06/18 0324  02/07/18 1221  02/09/18 0707 02/10/18 0329 02/12/18 0448 02/26/18 2109  NA 135   < > 145   < >  --    < > 136 138 137 134*  K 4.5   < > 3.7   < >  --    < > 3.9 4.4 4.5 4.3  CL 103   < > 110   < >  --    < > 102 102 101 101  CO2 23   < > 27   < >  --    < > '27 29 29 25  '$ GLUCOSE 420*   < > 125*   < >  --    < > 348* 233* 125* 325*  BUN 20   < > 14   < >  --    < > 26* 20 11 48*  CREATININE 1.28*   < > 0.97   < >  --    < > 1.44* 1.29* 1.11 1.33*  CALCIUM 8.5*   < > 8.9   < >  --    < > 8.0* 8.0* 8.7* 8.8*  MG 1.8  --  2.0  --  1.8  --   --   --   --   --   PHOS 3.2  --   --   --  1.8*   --  2.5  --   --   --    < > = values in this interval not displayed.   Recent Labs    01/29/18 1942 01/31/18 0612 02/03/18 2005  AST '30 15 27  '$ ALT '22 13 23  '$ ALKPHOS 112 76 115  BILITOT 1.9* 0.3 2.0*  PROT 6.0* 5.3* 6.5  ALBUMIN 2.9* 2.3* 3.1*   Recent Labs    12/24/17 0325 01/29/18 1942  02/03/18 2005  02/10/18 0329 02/11/18 1503 02/12/18 0448 02/26/18 2109  WBC 10.7* 9.5   < > 26.5*   < > 9.4  --  9.2 9.9  NEUTROABS 6.5 7.5  --  24.4*  --   --   --   --   --   HGB 12.3* 13.5   < > 11.5*   < > 8.6* 10.0* 9.2* 8.4*  HCT 36.9* 40.4   < > 37.5*   < > 27.7* 31.8* 29.1* 25.7*  MCV 89.6 90.6   < > 98.4   < > 92.6  --  93.3 95.5  PLT 318 350   < > 426*   < > 267  --  337 365   < > = values in this interval not displayed.   Lab Results  Component Value Date   TSH 1.741 12/24/2017   Lab Results  Component Value Date   HGBA1C 10.1 (H) 01/30/2018   Lab Results  Component Value Date   CHOL 153 07/25/2016   HDL 34 (L) 07/25/2016   LDLCALC 100 (H) 07/25/2016   TRIG 97 07/25/2016   CHOLHDL 4.5 07/25/2016    Significant Diagnostic Results in last 30 days:  Dg Chest 2 View  Result Date: 02/09/2018 CLINICAL DATA:  Fever, aspiration pneumonia. EXAM: CHEST - 2 VIEW COMPARISON:  Portable chest x-ray of February 07, 2018 FINDINGS: The lungs remain mildly hyperinflated. There is a persistent infiltrate in the left lower lobe posterior-medially. There is no pleural effusion or pneumothorax. The heart and pulmonary vascularity are normal. The mediastinum is normal in width. The bony thorax exhibits no acute abnormality. IMPRESSION: Persistent left lower lobe pneumonia. Underlying COPD or reactive airway disease. Electronically Signed   By: David  Martinique M.D.   On: 02/09/2018 10:21   Dg Chest 2 View  Result Date: 02/03/2018 CLINICAL DATA:  Syncope EXAM: CHEST - 2 VIEW COMPARISON:  01/29/2018, 12/23/2017 FINDINGS: The heart size and mediastinal contours are within normal limits. Both  lungs are clear. No pneumothorax. IMPRESSION: No active cardiopulmonary disease. Electronically Signed   By: Donavan Foil M.D.   On: 02/03/2018 20:03   Dg Chest 2 View  Result Date: 01/29/2018 CLINICAL DATA:  Cough.  Syncope.  Smoker. EXAM: CHEST - 2 VIEW COMPARISON:  12/23/2017 FINDINGS: The heart size and mediastinal contours are within normal limits. Both lungs are clear. Several old left rib fracture deformities are again noted. IMPRESSION: No active cardiopulmonary disease. Electronically Signed   By: Earle Gell M.D.   On: 01/29/2018 20:17   Dg Ankle Complete Left  Result Date: 01/29/2018 CLINICAL DATA:  Left ankle pain. EXAM: LEFT ANKLE COMPLETE - 3+ VIEW COMPARISON:  01/20/2018 FINDINGS: There is no evidence of fracture, dislocation, or joint effusion. There is no evidence of arthropathy or other focal bone abnormality. Extensive peripheral vascular calcification noted. IMPRESSION: No acute findings. Electronically Signed   By: Earle Gell M.D.   On: 01/29/2018 20:19   Ct Head Wo Contrast  Result Date: 02/03/2018 CLINICAL DATA:  Ct head/cspine wo, Pt has been walking in the rain today trying to get home after a dc from hospital, pt is cold to touch. Pt has abrasion to forehead, he fell face forwards onto cement. Pt has neck back and head pain 10/10. EXAM: CT HEAD WITHOUT CONTRAST CT CERVICAL SPINE WITHOUT CONTRAST TECHNIQUE: Multidetector CT imaging of the head and cervical spine was performed following the standard protocol without intravenous contrast. Multiplanar CT image reconstructions of the cervical spine were also generated. COMPARISON:  CT of the head on 01/30/2018 FINDINGS: CT HEAD FINDINGS Brain: There is central and cortical atrophy. Periventricular white matter changes are consistent with small vessel disease. The basilar cisterns and ventricles have a normal appearance. There is no CT evidence for acute infarction or hemorrhage. A 4 millimeter calcified lesion is identified  along the INNER table of the RIGHT frontal bone, consistent with small meningioma. There is no associated edema or mass effect. The appearance is stable. Vascular: There is dense atherosclerotic calcification of the internal carotid arteries. Skull: Normal. Negative for fracture or focal lesion. Sinuses/Orbits: Soft tissue swelling, polyp, or mucous retention cyst within the LEFT maxillary sinus. Chronic deformity of the nasal bones consistent with remote fracture. Other: None CT CERVICAL SPINE FINDINGS Alignment: Normal. Skull base and vertebrae: No acute fracture. No primary bone lesion or focal pathologic process. Soft tissues and spinal canal: There is atherosclerotic calcification of the vertebral and carotid arteries. Spinal canal is unremarkable. Disc levels:  Disc height loss and uncovertebral spurring at C6-7. Upper chest: Negative. Other: None IMPRESSION: 1. Atrophy and small vessel disease. 2.  No evidence for acute intracranial abnormality. 3. Remote fractures of the nasal bones. 4. Stable 4 millimeter calcified meningioma RIGHT frontal region not associated with edema or mass effect. 5. Mid cervical degenerative changes without evidence for acute cervical spine fracture. Electronically Signed   By: Nolon Nations M.D.   On: 02/03/2018 20:29   Ct Head Wo Contrast  Result Date: 01/30/2018 CLINICAL DATA:  Syncope EXAM: CT HEAD WITHOUT CONTRAST TECHNIQUE: Contiguous axial images were obtained from the base of the skull through the vertex without intravenous contrast. COMPARISON:  04/18/2017 FINDINGS: Brain: No acute territorial infarction, hemorrhage, or intracranial mass is visualized. Mild small vessel ischemic changes of the white matter. Mild atrophy. Stable ventricle size. Vascular: No hyperdense vessels. Vertebral artery and carotid vascular calcification Skull: Normal. Negative for fracture or focal lesion. Sinuses/Orbits: Chronic nasal bone deformity. Mild mucosal thickening in the sinuses.  Mucous retention cyst in the left maxillary sinus. Other: None IMPRESSION: 1. No CT evidence for acute intracranial abnormality. 2. Atrophy and small vessel ischemic changes of the white matter Electronically Signed   By: Donavan Foil M.D.   On: 01/30/2018 02:13   Ct Cervical Spine Wo Contrast  Result Date: 02/03/2018 CLINICAL DATA:  Ct head/cspine wo, Pt has been walking in the rain today trying to get home after a dc from hospital, pt is cold to touch. Pt has abrasion to forehead, he fell face forwards onto cement. Pt has neck back and head pain 10/10. EXAM: CT HEAD WITHOUT CONTRAST CT CERVICAL SPINE WITHOUT CONTRAST TECHNIQUE: Multidetector CT imaging of the head and cervical spine was performed following the standard protocol without intravenous contrast. Multiplanar CT image reconstructions of the cervical spine were also generated. COMPARISON:  CT of the head on 01/30/2018 FINDINGS: CT HEAD FINDINGS Brain: There is central and cortical atrophy. Periventricular white matter changes are consistent with small vessel disease. The basilar cisterns  and ventricles have a normal appearance. There is no CT evidence for acute infarction or hemorrhage. A 4 millimeter calcified lesion is identified along the INNER table of the RIGHT frontal bone, consistent with small meningioma. There is no associated edema or mass effect. The appearance is stable. Vascular: There is dense atherosclerotic calcification of the internal carotid arteries. Skull: Normal. Negative for fracture or focal lesion. Sinuses/Orbits: Soft tissue swelling, polyp, or mucous retention cyst within the LEFT maxillary sinus. Chronic deformity of the nasal bones consistent with remote fracture. Other: None CT CERVICAL SPINE FINDINGS Alignment: Normal. Skull base and vertebrae: No acute fracture. No primary bone lesion or focal pathologic process. Soft tissues and spinal canal: There is atherosclerotic calcification of the vertebral and carotid  arteries. Spinal canal is unremarkable. Disc levels:  Disc height loss and uncovertebral spurring at C6-7. Upper chest: Negative. Other: None IMPRESSION: 1. Atrophy and small vessel disease. 2.  No evidence for acute intracranial abnormality. 3. Remote fractures of the nasal bones. 4. Stable 4 millimeter calcified meningioma RIGHT frontal region not associated with edema or mass effect. 5. Mid cervical degenerative changes without evidence for acute cervical spine fracture. Electronically Signed   By: Nolon Nations M.D.   On: 02/03/2018 20:29   Ct Abdomen Pelvis W Contrast  Result Date: 02/02/2018 CLINICAL DATA:  Fall down stairs, generalized pain. Diverticulitis suspected. EXAM: CT ABDOMEN AND PELVIS WITH CONTRAST TECHNIQUE: Multidetector CT imaging of the abdomen and pelvis was performed using the standard protocol following bolus administration of intravenous contrast. CONTRAST:  157m ISOVUE-300 IOPAMIDOL (ISOVUE-300) INJECTION 61% COMPARISON:  CT pelvis dated 02/23/2005. MRI abdomen dated 06/15/2004. FINDINGS: Lower chest: Lung bases are clear. Hepatobiliary: Stable mass within the LEFT liver lobe, compatible with benign hemangioma. No suspicious mass or lesion within the liver. No hepatic injury or perihepatic hematoma. Gallbladder appears normal. No bile duct dilatation. Pancreas: Unremarkable. No pancreatic ductal dilatation or surrounding inflammatory changes. Spleen: No splenic injury or perisplenic hematoma. Adrenals/Urinary Tract: Adrenal glands are unremarkable. 3 mm nonobstructing RIGHT renal stone. Kidneys otherwise unremarkable without suspicious mass, stone or hydronephrosis. No perinephric hemorrhage. Bladder appears normal, moderately distended. Stomach/Bowel: No dilated large or small bowel loops. No evidence of bowel Herard inflammation or bowel Bonsall injury. Fairly large amount of stool throughout the nondistended colon. Appendix is normal. Stomach is unremarkable, partially decompressed.  Vascular/Lymphatic: Aortic atherosclerosis. No enlarged abdominal or pelvic lymph nodes. Reproductive: Prostate is unremarkable. Other: No free fluid or hemorrhage within the abdomen or pelvis. No free intraperitoneal air. Musculoskeletal: Mild degenerative spondylosis of the lumbar spine. No acute appearing osseous abnormality. No evidence of acute fracture or subluxation within the lumbar spine or osseous pelvis. IMPRESSION: 1. No acute findings within the abdomen or pelvis. No evidence of solid organ injury. No free fluid or hemorrhage. No bowel obstruction or evidence of bowel Mukherjee inflammation. Appendix is normal. No osseous fracture or dislocation. 2. Fairly large amount of stool throughout the nondistended colon (constipation?). 3. Stable benign hemangioma within the LEFT liver lobe. 4.  Aortic Atherosclerosis (ICD10-I70.0). 5. 3 mm nonobstructing RIGHT renal stone. Electronically Signed   By: SFranki CabotM.D.   On: 02/02/2018 17:00   [place]al  Result Date: 02/04/2018 CLINICAL DATA:  62year old male with acute renal failure. EXAM: RENAL / URINARY TRACT ULTRASOUND COMPLETE COMPARISON:  CT Abdomen and Pelvis 02/02/2018 the. Abdomen ultrasound 09/30/2016. FINDINGS: Right Kidney: Length: 10.9 centimeters. Echogenicity at the upper limits of normal. No mass or hydronephrosis visualized. Right nephrolithiasis redemonstrated on  image 20. Left Kidney: Length: 10.5 centimeters. Echogenicity at the upper limits of normal. No mass or hydronephrosis visualized. Bladder: Posterior bladder diverticulum redemonstrated. No urinary debris identified. IMPRESSION: 1. No acute renal findings. 2. Right nephrolithiasis.  Bladder diverticulum. Electronically Signed   By: Genevie Ann M.D.   On: 02/04/2018 16:07   Dg Chest Port 1 View  Result Date: 02/07/2018 CLINICAL DATA:  62 year old male with nausea vomiting. Recent fall EXAM: PORTABLE CHEST 1 VIEW COMPARISON:  02/04/2018 and earlier. FINDINGS: Portable AP upright view  at 0716 hours. Lung volumes appear normal. Allowing for portable technique the lungs are clear. Normal cardiac size and mediastinal contours. Visualized tracheal air column is within normal limits. No pneumothorax. Chronic left lateral 4th and 8th rib fractures are chronic and stable. No acute osseous abnormality identified. IMPRESSION: 1. No cardiopulmonary abnormality. 2. Chronic left rib fractures. Electronically Signed   By: Genevie Ann M.D.   On: 02/07/2018 09:48   Dg Chest Port 1 View  Result Date: 02/04/2018 CLINICAL DATA:  Syncope.  Hyperglycemia. EXAM: PORTABLE CHEST 1 VIEW COMPARISON:  February 03, 2018 FINDINGS: There is no edema or consolidation. There is mild left base atelectasis. Heart is upper normal in size with pulmonary vascularity normal. No adenopathy. There is a healed fracture of the lateral left fourth rib. IMPRESSION: Left base atelectasis. No edema or consolidation. Heart upper normal in size. Electronically Signed   By: Lowella Grip III M.D.   On: 02/04/2018 12:41   Dg Knee Complete 4 Views Left  Result Date: 01/29/2018 CLINICAL DATA:  Left knee pain. EXAM: LEFT KNEE - COMPLETE 4+ VIEW COMPARISON:  None. FINDINGS: No evidence of fracture, dislocation, or joint effusion. No evidence of arthropathy or other focal bone abnormality. Extensive peripheral vascular calcification noted. IMPRESSION: No acute findings. Electronically Signed   By: Earle Gell M.D.   On: 01/29/2018 20:18   Dg Abd Portable 1v  Result Date: 02/07/2018 CLINICAL DATA:  62 year old male with nausea vomiting.  Recent fall EXAM: PORTABLE ABDOMEN - 1 VIEW COMPARISON:  CT Abdomen and Pelvis 02/02/2018 FINDINGS: Portable AP supine view at 0716 hours. Non obstructed bowel gas pattern. Negative visible lung bases. Iliac artery calcified atherosclerosis. No acute osseous abnormality identified. IMPRESSION: Negative. Electronically Signed   By: Genevie Ann M.D.   On: 02/07/2018 09:46   Dg Swallowing Func-speech  Pathology  Result Date: 02/16/2018 Objective Swallowing Evaluation: Type of Study: MBS-Modified Barium Swallow Study  Patient Details Name: KYRIE FLUDD MRN: [id number] Date of Birth: April 09, 1956 Today's Date: 02/16/2018 Time: SLP Start Time (ACUTE ONLY): 1200 -SLP Stop Time (ACUTE ONLY): 1225 SLP Time Calculation (min) (ACUTE ONLY): 25 min Past Medical History: Past Medical History: Diagnosis Date . DEPRESSION  . DIABETES MELLITUS, TYPE I  . DRUG ABUSE   pt should have NO controlled substances rx'ed . Glaucoma  . HEPATITIS C   chronic . Hypertension 02/18/2011 . Proliferative diabetic retinopathy(362.02)  . Vitiligo  Past Surgical History: Past Surgical History: Procedure Laterality Date . ABDOMINAL AORTOGRAM W/LOWER EXTREMITY N/A 07/25/2016  Procedure: Abdominal Aortogram w/Bilateral Lower Extremity Runoff;  Surgeon: Conrad Hallett, MD;  Location: [facility];  Service: Cardiovascular;  Laterality: N/A; . ABDOMINAL AORTOGRAM W/LOWER EXTREMITY N/A 12/24/2016  Procedure: ABDOMINAL AORTOGRAM W/LOWER EXTREMITY;  Surgeon: Serafina Mitchell, MD;  Location: [place] [facility];  Service: Cardiovascular;  Laterality: N/A; . AMPUTATION TOE Right 07/26/2016  Procedure: AMPUTATION GREAT TOE;  Surgeon: Serafina Mitchell, MD;  Location: [place];  Service: Vascular;  Laterality: Right; . EYE SURGERY    retinal surgery x 2, right eye . INSERTION OF ILIAC STENT Right 07/26/2016  Procedure: INSERTION OF RIGHT POPLITEAL STENT WITH BALLOON ANGIOPLASTY;  Surgeon: Serafina Mitchell, MD;  Location: [place];  Service: Vascular;  Laterality: Right; . LOWER EXTREMITY ANGIOGRAM Right 07/26/2016  Procedure: LOWER EXTREMITY ANGIOGRAM;  Surgeon: Serafina Mitchell, MD;  Location: [place];  Service: Vascular;  Laterality: Right; . PERIPHERAL VASCULAR BALLOON ANGIOPLASTY Right 07/26/2016  Procedure: PERIPHERAL VASCULAR BALLOON ANGIOPLASTY  RIGHT ANTERIOR TIBEAL ARTERY AND RIGHT SUPERFICIAL FEMORAL ARTERY;  Surgeon: Serafina Mitchell, MD;  Location: [place];  Service:  Vascular;  Laterality: Right; . PERIPHERAL VASCULAR INTERVENTION Right 12/24/2016  Procedure: PERIPHERAL VASCULAR INTERVENTION;  Surgeon: Serafina Mitchell, MD;  Location: [place] [facility];  Service: Cardiovascular;  Laterality: Right;  lower extr HPI: Pt is a 62 y/o male with history of medical noncompliance, polysubstance abuse, and diabetes.  Admitted 10/22 with hyperglycemia, diabetic ketoacidosis, dehydration and renal failure after a fall at home.  Initially was alert and oriented but became more encephalopathic overnight. Has had some episodes of vomiting.  Pt underwent BSE yesterday and was placed on dys1/thin diet.  Heard pt coughing from outside the room and RN reports he took pt's meal away due to aspiration concerns.  Follow up to assess tolerance however pt was made npo due to continued coughing with po intake.   Subjective: "do you think it might be cause ive been smoking my whole life?" *sarcastic tone* Assessment / Plan / Recommendation CHL IP CLINICAL IMPRESSIONS 02/16/2018 Clinical Impression   Pt demonstrates swallow function essentially unchanged from prior assessment. Oral transit and swallow response is generally discoordinated with premature spillage and piecemeal swallowing both resulting in silent and sensed aspiration of liquids pooled in pyriforms before and after the swallow. The more SLP intervened to control bolus size/rate of intake the more poorly coordinated pts function. Best swallows were with pt led, self fed continuous rapid intake with a straw; pt was more automatic and aspiration did not occur in this instance. Overall, pt has refused thickened liquids and would be expected to have ongoing risk of aspirating nectar as well as thin over the long term as well as additional risk of dehydration. Would recommend pt resume a regular diet and thin liquids with SLP f/u at next level of care. Discussed with MD SLP Visit Diagnosis Dysphagia, oropharyngeal phase (R13.12) Attention and  concentration deficit following -- Frontal lobe and executive function deficit following -- Impact on safety and function Moderate aspiration risk   CHL IP TREATMENT RECOMMENDATION 02/09/2018 Treatment Recommendations Therapy as outlined in treatment plan below   Prognosis 02/16/2018 Prognosis for Safe Diet Advancement Fair Barriers to Reach Goals Behavior Barriers/Prognosis Comment -- CHL IP DIET RECOMMENDATION 02/16/2018 SLP Diet Recommendations Regular solids;Thin liquid Liquid Administration via Cup;Straw Medication Administration Whole meds with puree Compensations Slow rate;Minimize environmental distractions;Clear throat intermittently;Hard cough after swallow Postural Changes Remain semi-upright after after feeds/meals (Comment)   CHL IP OTHER RECOMMENDATIONS 02/16/2018 Recommended Consults -- Oral Care Recommendations Oral care BID Other Recommendations --   CHL IP FOLLOW UP RECOMMENDATIONS 02/16/2018 Follow up Recommendations Home health SLP;Skilled Nursing facility   [facility] IP FREQUENCY AND DURATION 02/16/2018 Speech Therapy Frequency (ACUTE ONLY) min 2x/week Treatment Duration 2 weeks      CHL IP ORAL PHASE 02/16/2018 Oral Phase Impaired Oral - Pudding Teaspoon -- Oral - Pudding Cup -- Oral - Honey Teaspoon -- Oral -  Honey Cup -- Oral - Nectar Teaspoon -- Oral - Nectar Cup Delayed oral transit;Decreased bolus cohesion;Premature spillage;Piecemeal swallowing Oral - Nectar Straw -- Oral - Thin Teaspoon -- Oral - Thin Cup Delayed oral transit;Decreased bolus cohesion;Premature spillage;Piecemeal swallowing Oral - Thin Straw Delayed oral transit;Decreased bolus cohesion;Premature spillage;Piecemeal swallowing Oral - Puree WFL Oral - Mech Soft -- Oral - Regular WFL Oral - Multi-Consistency NT Oral - Pill -- Oral Phase - Comment --  CHL IP PHARYNGEAL PHASE 02/16/2018 Pharyngeal Phase Impaired Pharyngeal- Pudding Teaspoon -- Pharyngeal -- Pharyngeal- Pudding Cup -- Pharyngeal -- Pharyngeal- Honey Teaspoon -- Pharyngeal  -- Pharyngeal- Honey Cup -- Pharyngeal -- Pharyngeal- Nectar Teaspoon -- Pharyngeal -- Pharyngeal- Nectar Cup NT Pharyngeal -- Pharyngeal- Nectar Straw NT Pharyngeal -- Pharyngeal- Thin Teaspoon Delayed swallow initiation-vallecula Pharyngeal -- Pharyngeal- Thin Cup Delayed swallow initiation-pyriform sinuses;Penetration/Aspiration before swallow;Penetration/Apiration after swallow;Trace aspiration Pharyngeal Material enters airway, passes BELOW cords without attempt by patient to eject out (silent aspiration);Material enters airway, CONTACTS cords and not ejected out;Material does not enter airway;Material enters airway, remains ABOVE vocal cords then ejected out Pharyngeal- Thin Straw Delayed swallow initiation-pyriform sinuses;Penetration/Aspiration before swallow;Penetration/Apiration after swallow;Trace aspiration Pharyngeal Material enters airway, passes BELOW cords without attempt by patient to eject out (silent aspiration);Material enters airway, CONTACTS cords and not ejected out;Material does not enter airway;Material enters airway, remains ABOVE vocal cords then ejected out Pharyngeal- Puree Delayed swallow initiation-vallecula Pharyngeal -- Pharyngeal- Mechanical Soft -- Pharyngeal -- Pharyngeal- Regular Delayed swallow initiation-vallecula Pharyngeal -- Pharyngeal- Multi-consistency -- Pharyngeal -- Pharyngeal- Pill -- Pharyngeal -- Pharyngeal Comment --  CHL IP CERVICAL ESOPHAGEAL PHASE 02/09/2018 Cervical Esophageal Phase WFL Pudding Teaspoon -- Pudding Cup -- Honey Teaspoon -- Honey Cup -- Nectar Teaspoon -- Nectar Cup -- Nectar Straw -- Thin Teaspoon -- Thin Cup -- Thin Straw -- Puree -- Mechanical Soft -- Regular -- Multi-consistency -- Pill -- Cervical Esophageal Comment -- Lynann Beaver 02/16/2018, 2:13 PM              Dg Swallowing Func-speech Pathology  Result Date: 02/10/2018 Objective Swallowing Evaluation: Type of Study: MBS-Modified Barium Swallow Study  Patient Details Name:  MASARU CHAMBERLIN MRN: [id number] Date of Birth: 28-Nov-1955 Today's Date: 02/10/2018 Time: SLP Start Time (ACUTE ONLY): 6948 -SLP Stop Time (ACUTE ONLY): 1038 SLP Time Calculation (min) (ACUTE ONLY): 31 min Past Medical History: Past Medical History: Diagnosis Date . DEPRESSION  . DIABETES MELLITUS, TYPE I  . DRUG ABUSE   pt should have NO controlled substances rx'ed . Glaucoma  . HEPATITIS C   chronic . Hypertension 02/18/2011 . Proliferative diabetic retinopathy(362.02)  . Vitiligo  Past Surgical History: Past Surgical History: Procedure Laterality Date . ABDOMINAL AORTOGRAM W/LOWER EXTREMITY N/A 07/25/2016  Procedure: Abdominal Aortogram w/Bilateral Lower Extremity Runoff;  Surgeon: Conrad Newark, MD;  Location: [place] [facility];  Service: Cardiovascular;  Laterality: N/A; . ABDOMINAL AORTOGRAM W/LOWER EXTREMITY N/A 12/24/2016  Procedure: ABDOMINAL AORTOGRAM W/LOWER EXTREMITY;  Surgeon: Serafina Mitchell, MD;  Location: [place] [facility];  Service: Cardiovascular;  Laterality: N/A; . AMPUTATION TOE Right 07/26/2016  Procedure: AMPUTATION GREAT TOE;  Surgeon: Serafina Mitchell, MD;  Location: [place];  Service: Vascular;  Laterality: Right; . EYE SURGERY    retinal surgery x 2, right eye . INSERTION OF ILIAC STENT Right 07/26/2016  Procedure: INSERTION OF RIGHT POPLITEAL STENT WITH BALLOON ANGIOPLASTY;  Surgeon: Serafina Mitchell, MD;  Location: [place];  Service: Vascular;  Laterality: Right; . LOWER EXTREMITY ANGIOGRAM Right 07/26/2016  Procedure:  LOWER EXTREMITY ANGIOGRAM;  Surgeon: Serafina Mitchell, MD;  Location: [place];  Service: Vascular;  Laterality: Right; . PERIPHERAL VASCULAR BALLOON ANGIOPLASTY Right 07/26/2016  Procedure: PERIPHERAL VASCULAR BALLOON ANGIOPLASTY  RIGHT ANTERIOR TIBEAL ARTERY AND RIGHT SUPERFICIAL FEMORAL ARTERY;  Surgeon: Serafina Mitchell, MD;  Location: [place];  Service: Vascular;  Laterality: Right; . PERIPHERAL VASCULAR INTERVENTION Right 12/24/2016  Procedure: PERIPHERAL VASCULAR INTERVENTION;  Surgeon:  Serafina Mitchell, MD;  Location: [place] [facility];  Service: Cardiovascular;  Laterality: Right;  lower extr HPI: Pt is a 62 y/o male with history of medical noncompliance, polysubstance abuse, and diabetes.  Admitted 10/22 with hyperglycemia, diabetic ketoacidosis, dehydration and renal failure after a fall at home.  Initially was alert and oriented but became more encephalopathic overnight. Has had some episodes of vomiting.  Pt underwent BSE yesterday and was placed on dys1/thin diet.  Heard pt coughing from outside the room and RN reports he took pt's meal away due to aspiration concerns.  Follow up to assess tolerance and indication for instrumental evaluation.   Subjective: "I cough because I have COPD" Assessment / Plan / Recommendation CHL IP CLINICAL IMPRESSIONS 02/09/2018 Clinical Impression Patient presents with moderate sensorimotor oropharyngeal dysphagia - Oral control impaired with liquids mostly resulting in premature spillage of oral residuals to pyriform sinus with liquids.  Thin liquid pooling in pyriform sinus then spills into open airway with next swallow resulting in silent aspiration.  Dry swallows on command were difficult for pt to perform. Head turn left *pt's reported weak side* did not improve airway protection or pharyngeal clearance.  Recommend pt solids be advanced to dys3/ground meats and modify to nectar thick liquids with free water between meals.  Pt will benefit from aggressive SlP to improve oral and pharyngeal strenghening as he states he is motivated.  Using live images/flouro loops educated pt to findings and reinforced effective strategies.   SLP Visit Diagnosis Dysphagia, oropharyngeal phase (R13.12) Attention and concentration deficit following -- Frontal lobe and executive function deficit following -- Impact on safety and function Moderate aspiration risk   CHL IP TREATMENT RECOMMENDATION 02/09/2018 Treatment Recommendations Therapy as outlined in treatment plan below    Prognosis 02/09/2018 Prognosis for Safe Diet Advancement Fair Barriers to Reach Goals Time post onset Barriers/Prognosis Comment -- CHL IP DIET RECOMMENDATION 02/09/2018 SLP Diet Recommendations Dysphagia 3 (Mech soft) solids;Nectar thick liquid;Other (Comment);Free water protocol after oral care Liquid Administration via Cup;No straw Medication Administration Whole meds with puree Compensations Slow rate;Small sips/bites;Follow solids with liquid;Effortful swallow Postural Changes Remain semi-upright after after feeds/meals (Comment);Seated upright at 90 degrees   CHL IP OTHER RECOMMENDATIONS 02/09/2018 Recommended Consults -- Oral Care Recommendations Oral care BID Other Recommendations --   CHL IP FOLLOW UP RECOMMENDATIONS 02/09/2018 Follow up Recommendations Home health SLP;Skilled Nursing facility   [facility] IP FREQUENCY AND DURATION 02/09/2018 Speech Therapy Frequency (ACUTE ONLY) min 2x/week Treatment Duration 2 weeks      CHL IP ORAL PHASE 02/09/2018 Oral Phase Impaired Oral - Pudding Teaspoon -- Oral - Pudding Cup -- Oral - Honey Teaspoon -- Oral - Honey Cup -- Oral - Nectar Teaspoon -- Oral - Nectar Cup Decreased bolus cohesion;Premature spillage Oral - Nectar Straw -- Oral - Thin Teaspoon Decreased bolus cohesion;Reduced posterior propulsion Oral - Thin Cup Decreased bolus cohesion;Lingual/palatal residue;Reduced posterior propulsion;Premature spillage Oral - Thin Straw Decreased bolus cohesion;Lingual/palatal residue;Reduced posterior propulsion;Premature spillage Oral - Puree Reduced posterior propulsion;Premature spillage Oral - Mech Soft -- Oral - Regular Decreased bolus cohesion;Reduced  posterior propulsion;Premature spillage Oral - Multi-Consistency Reduced posterior propulsion;Lingual/palatal residue;Decreased bolus cohesion;Premature spillage Oral - Pill -- Oral Phase - Comment --  CHL IP PHARYNGEAL PHASE 02/09/2018 Pharyngeal Phase Impaired Pharyngeal- Pudding Teaspoon -- Pharyngeal -- Pharyngeal-  Pudding Cup -- Pharyngeal -- Pharyngeal- Honey Teaspoon -- Pharyngeal -- Pharyngeal- Honey Cup -- Pharyngeal -- Pharyngeal- Nectar Teaspoon -- Pharyngeal -- Pharyngeal- Nectar Cup Reduced epiglottic inversion Pharyngeal -- Pharyngeal- Nectar Straw -- Pharyngeal -- Pharyngeal- Thin Teaspoon Delayed swallow initiation-pyriform sinuses Pharyngeal -- Pharyngeal- Thin Cup Delayed swallow initiation-pyriform sinuses;Reduced epiglottic inversion;Penetration/Aspiration during swallow;Trace aspiration;Penetration/Aspiration before swallow Pharyngeal Material enters airway, passes BELOW cords without attempt by patient to eject out (silent aspiration) Pharyngeal- Thin Straw Delayed swallow initiation-pyriform sinuses;Trace aspiration;Penetration/Aspiration during swallow Pharyngeal Material enters airway, passes BELOW cords without attempt by patient to eject out (silent aspiration) Pharyngeal- Puree Reduced epiglottic inversion Pharyngeal -- Pharyngeal- Mechanical Soft -- Pharyngeal -- Pharyngeal- Regular Reduced epiglottic inversion Pharyngeal -- Pharyngeal- Multi-consistency Reduced epiglottic inversion;Penetration/Aspiration during swallow;Trace aspiration;Delayed swallow initiation-pyriform sinuses Pharyngeal Material enters airway, passes BELOW cords without attempt by patient to eject out (silent aspiration);Material enters airway, passes BELOW cords and not ejected out despite cough attempt by patient Pharyngeal- Pill -- Pharyngeal -- Pharyngeal Comment pt with aspiration of oral residuals of thin spilling from pyriform sinus into larynx/trachea with swallowing, small bolus with cognitive readiness "1,2,3" to swallow entire bolus helpful,  chin tuck posture worsened aspiration as it dumped residuals into larynx/trachea, cued "hock" did not clear residuals but was strong and rec pt continue this strategie  CHL IP CERVICAL ESOPHAGEAL PHASE 02/09/2018 Cervical Esophageal Phase WFL Pudding Teaspoon -- Pudding Cup -- Honey  Teaspoon -- Honey Cup -- Nectar Teaspoon -- Nectar Cup -- Nectar Straw -- Thin Teaspoon -- Thin Cup -- Thin Straw -- Puree -- Mechanical Soft -- Regular -- Multi-consistency -- Pill -- Cervical Esophageal Comment -- [name] 02/10/2018, 7:47 AM  Luanna Salk, MS [facility] SLP Acute Rehab Services Pager [phone] Office [phone]             Vas US Carotid  Result Date: 01/30/2018 Carotid Arterial Duplex Study Indications: Syncope. Performing Technologist: Abram Sander  Examination Guidelines: A complete evaluation includes B-mode imaging, spectral Doppler, color Doppler, and power Doppler as needed of all accessible portions of each vessel. Bilateral testing is considered an integral part of a complete examination. Limited examinations for reoccurring indications may be performed as noted.  Right Carotid Findings: +----------+--------+--------+--------+-----------+--------+           PSV cm/sEDV cm/sStenosisDescribe   Comments +----------+--------+--------+--------+-----------+--------+ CCA Prox  89      7               homogeneous         +----------+--------+--------+--------+-----------+--------+ CCA Distal58      0               homogeneous         +----------+--------+--------+--------+-----------+--------+ ICA Prox  215     40      40-59%  homogeneous         +----------+--------+--------+--------+-----------+--------+ ICA Mid   223     33                                  +----------+--------+--------+--------+-----------+--------+ ICA Distal88      15                                  +----------+--------+--------+--------+-----------+--------+  ECA       192     35                                  +----------+--------+--------+--------+-----------+--------+ +----------+--------+-------+--------+-------------------+           PSV cm/sEDV cmsDescribeArm Pressure (mmHG) +----------+--------+-------+--------+-------------------+  [id number]                                        +----------+--------+-------+--------+-------------------+ +---------+--------+--+--------+-+---------+ VertebralPSV cm/s50EDV cm/s8Antegrade +---------+--------+--+--------+-+---------+  Left Carotid Findings: +----------+--------+--------+--------+-----------+--------+           PSV cm/sEDV cm/sStenosisDescribe   Comments +----------+--------+--------+--------+-----------+--------+ CCA Prox  101     9               homogeneous         +----------+--------+--------+--------+-----------+--------+ CCA Distal70      12              homogeneous         +----------+--------+--------+--------+-----------+--------+ ICA Prox  78      16      1-39%   homogeneous         +----------+--------+--------+--------+-----------+--------+ ICA Distal88      24                                  +----------+--------+--------+--------+-----------+--------+ ECA       125                                         +----------+--------+--------+--------+-----------+--------+ +----------+--------+--------+--------+-------------------+ SubclavianPSV cm/sEDV cm/sDescribeArm Pressure (mmHG) +----------+--------+--------+--------+-------------------+           65                                          +----------+--------+--------+--------+-------------------+ +---------+--------+--+--------+--+---------+ VertebralPSV cm/s44EDV cm/s11Antegrade +---------+--------+--+--------+--+---------+  Summary: Right Carotid: Velocities in the right ICA are consistent with a 40-59%                stenosis. Left Carotid: Velocities in the left ICA are consistent with a 1-39% stenosis. Vertebrals: Bilateral vertebral arteries demonstrate antegrade flow. *See table(s) above for measurements and observations.  Electronically signed by Harold Barban MD on 01/30/2018 at 1:25:20 PM.    Final     Assessment/Plan  1. Diabetic  polyneuropathy associated with type 1 diabetes mellitus ([name] -continue Lantus 100 units/mL inject 25 units subcu nightly and NovoLog 100 units/mL sliding scale subcutaneously twice daily, follow- up with endocrinology - gabapentin (NEURONTIN) 300 MG capsule; Take 1 capsule (300 mg total) by mouth every 6 (six) hours.  Dispense: 120 capsule; Refill: 0 - blood glucose meter kit and supplies KIT; Dispense based on patient and insurance preference. Use up to four times daily as directed. (FOR ICD-9 250.00, 250.01).  Dispense: 1 each; Refill: 0 - Isopropyl Alcohol Wipes 70 % MISC; Apply 1 each topically daily as needed.  Dispense: 270 each; Refill: 0 - Lancets MISC; 1 each by Does not apply route daily as needed.  Dispense: 100 each; Refill: 0  2. Essential hypertension - amLODipine (NORVASC) 10 MG tablet; Take  1 tablet (10 mg total) by mouth daily.  Dispense: 30 tablet; Refill: 0 - hydrALAZINE (APRESOLINE) 25 MG tablet; Take 1 tablet (25 mg total) by mouth every 8 (eight) hours.  Dispense: 90 tablet; Refill: 0  3. Gastroesophageal reflux disease, esophagitis presence not specified - metoCLOPramide (REGLAN) 5 MG tablet; Take 1 tablet (5 mg total) by mouth 3 (three) times daily.  Dispense: 90 tablet; Refill: 0 - pantoprazole (PROTONIX) 40 MG tablet; Take 1 tablet (40 mg total) by mouth daily at 6 (six) AM.  Dispense: 30 tablet; Refill: 0  4. Dysphagia, unspecified type -will have home health ST to work on swallowing functions, aspiration precautions   6. ETOH abuse - folic acid (FOLVITE) 1 MG tablet; Take 1 tablet (1 mg total) by mouth daily.  Dispense: 30 tablet; Refill: 0    I have filled out patient's discharge paperwork and written prescriptions.  Patient will receive home health PT, OT, and ST.  DME provided: Rolling walker.  Total discharge time: Greater than 30 minutes Greater than 50% was spent in counseling and coordination of care.  Discharge time involved coordination of the  discharge process with social worker, nursing staff and therapy department. Medical justification for home health services/DME verified.   Durenda Age, NP [facility] [phone] (Monday-Friday 8:00 a.m. - 5:00 p.m.) [phone] (after hours)

## 2018-03-03 ENCOUNTER — Telehealth: Payer: Self-pay | Admitting: *Deleted

## 2018-03-03 NOTE — Telephone Encounter (Signed)
Pt was on the patient ping report. He has been discharge from SNF. Per chart they had made him appt w/Dr. Quay Burow for 03/06/18. Tied calling pt to complete TCM questionnaire no answer x's 10 rings. Will retry later...[name]

## 2018-03-04 NOTE — Telephone Encounter (Signed)
Tried calling pt again still no answer x's 10 rings.../lmb 

## 2018-03-05 NOTE — Progress Notes (Deleted)
Subjective:    Patient ID: [name], male    DOB: 11-Apr-1956, 62 y.o.   MRN: [id number]  HPI The patient is here for follow up from the hospital  He has been in the hospital / ED 7 times in the past two months.      Medications and allergies reviewed with patient and updated if appropriate.  Patient Active Problem List   Diagnosis Date Noted  . At risk for adverse drug event 02/17/2018  . Abrasions of multiple sites   . Demand ischemia of myocardium ([place]) 02/05/2018  . Acute metabolic encephalopathy 40/76/8088  . DKA (diabetic ketoacidoses) ([place]) 02/03/2018  . Leukocytosis 02/03/2018  . Hypoglycemia 12/24/2017  . Syncope 12/24/2017  . Syncope and collapse 12/23/2017  . IV drug abuse ([place]) 04/19/2017  . Acute encephalopathy 04/19/2017  . Hemangioma 10/02/2016  . Foreign body (FB) in soft tissue   . 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[place])   . Gangrene ([place])   . Type I (juvenile type) diabetes mellitus without mention of complication, not stated as uncontrolled   . Tobacco abuse   . Essential hypertension   . Diabetic infection of right foot ([place]) 07/22/2016  . Diabetic foot infection ([place]) 07/22/2016  . Chronic ulcer of heel, right, with fat layer exposed ([place]) 05/20/2016  . DKA, type 1 ([place]) 04/18/2016  . Acute kidney injury ([place]) 04/18/2016  . Hyponatremia 04/18/2016  . Severe protein-calorie malnutrition ([place]) 04/18/2016  . Elevated troponin 04/18/2016  . Poor dentition 07/12/2015  . Tobacco use 07/06/2013  . PAD (peripheral artery disease) ([place]) 04/29/2013  . COPD, mild ([place])   . 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 Diabetic neuropathy ([place]) 06/21/2010  . PROLIFERATIVE DIABETIC RETINOPATHY 06/21/2010  . SKIN TAG 01/30/2010  . Chronic hepatitis C ([place]) 11/22/2008  . VITILIGO 11/22/2008  . PROTEINURIA, MILD 11/22/2008  . Substance abuse ([place]) 04/23/2007  . DEPRESSION 11/11/2006    Current Outpatient  Medications on File Prior to Visit  Medication Sig Dispense Refill  . amLODipine (NORVASC) 10 MG tablet Take 1 tablet (10 mg total) by mouth daily. 30 tablet 0  . aspirin 81 MG chewable tablet Chew 1 tablet (81 mg total) by mouth daily. 30 tablet 0  . benzonatate (TESSALON) 200 MG capsule Take 1 capsule (200 mg total) by mouth 3 (three) times daily as needed for cough. 20 capsule 0  . blood glucose meter kit and supplies KIT Dispense based on patient and insurance preference. Use up to four times daily as directed. (FOR ICD-9 250.00, 250.01). 1 each 0  . folic acid (FOLVITE) 1 MG tablet Take 1 tablet (1 mg total) by mouth daily. 30 tablet 0  . gabapentin (NEURONTIN) 300 MG capsule Take 1 capsule (300 mg total) by mouth every 6 (six) hours. 120 capsule 0  . hydrALAZINE (APRESOLINE) 25 MG tablet Take 1 tablet (25 mg total) by mouth every 8 (eight) hours. 90 tablet 0  . hydrocortisone (ANUSOL-HC) 2.5 % rectal cream Apply rectally 2 times daily 30 g 1  . insulin aspart (NOVOLOG) 100 UNIT/ML injection CBG 70 - 120: 0 units CBG 121 - 150: 3 units CBG 151 - 200: 4 units CBG 201 - 250: 7 units CBG 251 - 300: 11 units CBG 301 - 350: 15 units CBG 351 - 400: 20 units 10 mL 11  . insulin aspart (NOVOLOG) 100 UNIT/ML injection Inject 12-20 Units into the skin 2 (two) times daily. 100-200=12  units, 201-300=15 units, >300=20 units.    . insulin glargine (LANTUS) 100 UNIT/ML injection Inject 0.25 mLs (25 Units total) into the skin daily. 10 mL 11  . Isopropyl Alcohol Wipes 70 % MISC Apply 1 each topically daily as needed. 270 each 0  . Lancets MISC 1 each by Does not apply route daily as needed. 100 each 0  . magnesium hydroxide (MILK OF MAGNESIA) 400 MG/5ML suspension Take 30 mLs by mouth daily as needed for mild constipation.    . metoCLOPramide (REGLAN) 5 MG tablet Take 1 tablet (5 mg total) by mouth 3 (three) times daily. 90 tablet 0  . Multiple Vitamin (MULTIVITAMIN WITH MINERALS) TABS tablet Take 1  tablet by mouth daily. 30 tablet 2  . pantoprazole (PROTONIX) 40 MG tablet Take 1 tablet (40 mg total) by mouth daily at 6 (six) AM. 30 tablet 0  . Sodium Phosphates (RA SALINE ENEMA) 19-7 GM/118ML ENEM Place 118 mLs rectally daily as needed (constipation).    . thiamine 100 MG tablet Take 1 tablet (100 mg total) by mouth daily. 30 tablet 2   No current facility-administered medications on file prior to visit.     Past Medical History:  Diagnosis Date  . DEPRESSION   . DIABETES MELLITUS, TYPE I   . DRUG ABUSE    pt should have NO controlled substances rx'ed  . Glaucoma   . HEPATITIS C    chronic  . Hypertension 02/18/2011  . 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 Vitiligo     Past Surgical History:  Procedure Laterality Date  . ABDOMINAL AORTOGRAM W/LOWER EXTREMITY N/A 07/25/2016   Procedure: Abdominal Aortogram w/Bilateral Lower Extremity Runoff;  Surgeon: Conrad [place], MD;  Location: [facility];  Service: Cardiovascular;  Laterality: N/A;  . ABDOMINAL AORTOGRAM W/LOWER EXTREMITY N/A 12/24/2016   Procedure: ABDOMINAL AORTOGRAM W/LOWER EXTREMITY;  Surgeon: Serafina Mitchell, MD;  Location: [facility];  Service: Cardiovascular;  Laterality: N/A;  . AMPUTATION TOE Right 07/26/2016   Procedure: AMPUTATION GREAT TOE;  Surgeon: Serafina Mitchell, MD;  Location: [facility];  Service: Vascular;  Laterality: Right;  . EYE SURGERY     retinal surgery x 2, right eye  . INSERTION OF ILIAC STENT Right 07/26/2016   Procedure: INSERTION OF RIGHT POPLITEAL STENT WITH BALLOON ANGIOPLASTY;  Surgeon: Serafina Mitchell, MD;  Location: [facility];  Service: Vascular;  Laterality: Right;  . LOWER EXTREMITY ANGIOGRAM Right 07/26/2016   Procedure: LOWER EXTREMITY ANGIOGRAM;  Surgeon: Serafina Mitchell, MD;  Location: [facility];  Service: Vascular;  Laterality: Right;  . PERIPHERAL VASCULAR BALLOON ANGIOPLASTY Right 07/26/2016   Procedure: PERIPHERAL VASCULAR BALLOON ANGIOPLASTY  RIGHT ANTERIOR TIBEAL  ARTERY AND RIGHT SUPERFICIAL FEMORAL ARTERY;  Surgeon: Serafina Mitchell, MD;  Location: [facility];  Service: Vascular;  Laterality: Right;  . PERIPHERAL VASCULAR INTERVENTION Right 12/24/2016   Procedure: PERIPHERAL VASCULAR INTERVENTION;  Surgeon: Serafina Mitchell, MD;  Location: [facility];  Service: Cardiovascular;  Laterality: Right;  lower extr    Social History   Socioeconomic History  . Marital status: Single    Spouse name: Not on file  . Number of children: Not on file  . Years of education: Not on file  . Highest education level: Not on file  Occupational History    Employer: UNEMPLOYED    Comment: disabled  Social Needs  . Financial resource strain: Not on file  . Food insecurity:    Worry: Not on  file    Inability: Not on file  . Transportation needs:    Medical: Not on file    Non-medical: Not on file  Tobacco Use  . Smoking status: Current Every Day Smoker    Packs/day: 0.50    Types: Cigarettes  . Smokeless tobacco: Never Used  Substance and Sexual Activity  . Alcohol use: No    Alcohol/week: 0.0 standard drinks    Comment: 08/14/14 none  . Drug use: Yes    Types: "Crack" cocaine, Marijuana, Heroin    Comment: s/p rehab x 6, occasional drug use (per pt last  use was 06/2015)  . Sexual activity: Not Currently  Lifestyle  . Physical activity:    Days per week: Not on file    Minutes per session: Not on file  . Stress: Not on file  Relationships  . Social connections:    Talks on phone: Not on file    Gets together: Not on file    Attends religious service: Not on file    Active member of club or organization: Not on file    Attends meetings of clubs or organizations: Not on file    Relationship status: Not on file  Other Topics Concern  . Not on file  Social History Narrative   Lives in boarding house/homeless   Ongoing occ drug use-crack   Single, disabled, prev mental health drug counselor.     Family History  Problem Relation Age of Onset  .  Diabetes Father   . Kidney disease Father   . Arthritis Mother   . Heart disease Mother        CAD    Review of Systems     Objective:  There were no vitals filed for this visit. BP Readings from Last 3 Encounters:  02/27/18 118/62  02/26/18 132/70  02/17/18 139/68   Wt Readings from Last 3 Encounters:  02/27/18 161 lb 3.2 oz (73.1 kg)  02/26/18 149 lb 0.5 oz (67.6 kg)  02/17/18 149 lb 0.5 oz (67.6 kg)   There is no height or weight on file to calculate BMI.   Physical Exam    Constitutional: Appears well-developed and well-nourished. No distress.  HENT:  Head: Normocephalic and atraumatic.  Neck: Neck supple. No tracheal deviation present. No thyromegaly present.  No cervical lymphadenopathy Cardiovascular: Normal rate, regular rhythm and normal heart sounds.   No murmur heard. No carotid bruit .  No edema Pulmonary/Chest: Effort normal and breath sounds normal. No respiratory distress. No has no wheezes. No rales.  Abdomen:  Soft, NT, ND Skin: Skin is warm and dry. Not diaphoretic.  Psychiatric: Normal mood and affect. Behavior is normal.      Assessment & Plan:    See Problem List for Assessment and Plan of chronic medical problems.

## 2018-03-05 NOTE — Telephone Encounter (Signed)
Tried calling pt several times no one will answer phone. No VM either.Not able to get in contact w/patient.Marland KitchenJohny [name]

## 2018-03-06 ENCOUNTER — Inpatient Hospital Stay: Payer: Medicare HMO | Admitting: Internal Medicine

## 2018-06-15 ENCOUNTER — Encounter (HOSPITAL_COMMUNITY): Payer: Self-pay

## 2018-06-15 ENCOUNTER — Other Ambulatory Visit: Payer: Self-pay

## 2018-06-15 ENCOUNTER — Emergency Department (HOSPITAL_COMMUNITY): Payer: Medicare HMO

## 2018-06-15 ENCOUNTER — Emergency Department (HOSPITAL_COMMUNITY)
Admission: EM | Admit: 2018-06-15 | Discharge: 2018-06-15 | Discharge status: Against Medical Advice | Payer: Medicare HMO | Attending: Emergency Medicine | Admitting: Emergency Medicine

## 2018-06-15 DIAGNOSIS — R4182 Altered mental status, unspecified: Secondary | ICD-10-CM | POA: Diagnosis not present

## 2018-06-15 DIAGNOSIS — Z79899 Other long term (current) drug therapy: Secondary | ICD-10-CM | POA: Diagnosis not present

## 2018-06-15 DIAGNOSIS — F17210 Nicotine dependence, cigarettes, uncomplicated: Secondary | ICD-10-CM | POA: Insufficient documentation

## 2018-06-15 DIAGNOSIS — R062 Wheezing: Secondary | ICD-10-CM | POA: Diagnosis not present

## 2018-06-15 DIAGNOSIS — Z89421 Acquired absence of other right toe(s): Secondary | ICD-10-CM | POA: Insufficient documentation

## 2018-06-15 DIAGNOSIS — E103599 Type 1 diabetes mellitus with proliferative diabetic retinopathy without macular edema, unspecified eye: Secondary | ICD-10-CM | POA: Insufficient documentation

## 2018-06-15 DIAGNOSIS — Z7982 Long term (current) use of aspirin: Secondary | ICD-10-CM | POA: Insufficient documentation

## 2018-06-15 DIAGNOSIS — E162 Hypoglycemia, unspecified: Secondary | ICD-10-CM

## 2018-06-15 DIAGNOSIS — E10649 Type 1 diabetes mellitus with hypoglycemia without coma: Secondary | ICD-10-CM | POA: Insufficient documentation

## 2018-06-15 DIAGNOSIS — I10 Essential (primary) hypertension: Secondary | ICD-10-CM | POA: Insufficient documentation

## 2018-06-15 DIAGNOSIS — J449 Chronic obstructive pulmonary disease, unspecified: Secondary | ICD-10-CM | POA: Insufficient documentation

## 2018-06-15 LAB — COMPREHENSIVE METABOLIC PANEL
ALT: 19 U/L (ref 0–44)
AST: 20 U/L (ref 15–41)
Albumin: 3.5 g/dL (ref 3.5–5.0)
Alkaline Phosphatase: 114 U/L (ref 38–126)
Anion gap: 6 (ref 5–15)
BILIRUBIN TOTAL: 0.4 mg/dL (ref 0.3–1.2)
BUN: 22 mg/dL (ref 8–23)
CO2: 32 mmol/L (ref 22–32)
CREATININE: 1.35 mg/dL — AB (ref 0.61–1.24)
Calcium: 9.7 mg/dL (ref 8.9–10.3)
Chloride: 101 mmol/L (ref 98–111)
GFR calc Af Amer: >60 mL/min (ref >60)
GFR calc non Af Amer: 56 mL/min — ABNORMAL LOW (ref >60)
Glucose, Bld: 26 mg/dL — CL (ref 70–99)
Potassium: 3.3 mmol/L — ABNORMAL LOW (ref 3.5–5.1)
Sodium: 139 mmol/L (ref 135–145)
Total Protein: 7.3 g/dL (ref 6.5–8.1)

## 2018-06-15 LAB — CBC WITH DIFFERENTIAL/PLATELET
Abs Immature Granulocytes: 0.07 10*3/uL (ref 0.00–0.07)
Basophils Absolute: 0.1 10*3/uL (ref 0.0–0.1)
Basophils Relative: 1 %
EOS PCT: 1 %
Eosinophils Absolute: 0.1 10*3/uL (ref 0.0–0.5)
HCT: 41.3 % (ref 39.0–52.0)
Hemoglobin: 13.1 g/dL (ref 13.0–17.0)
Immature Granulocytes: 1 %
Lymphocytes Relative: 12 %
Lymphs Abs: 1.3 10*3/uL (ref 0.7–4.0)
MCH: 28.9 pg (ref 26.0–34.0)
MCHC: 31.7 g/dL (ref 30.0–36.0)
MCV: 91 fL (ref 80.0–100.0)
MONOS PCT: 7 %
Monocytes Absolute: 0.7 10*3/uL (ref 0.1–1.0)
Neutro Abs: 8.5 10*3/uL — ABNORMAL HIGH (ref 1.7–7.7)
Neutrophils Relative %: 78 %
Platelets: 444 10*3/uL — ABNORMAL HIGH (ref 150–400)
RBC: 4.54 MIL/uL (ref 4.22–5.81)
RDW: 13.1 % (ref 11.5–15.5)
WBC: 10.8 10*3/uL — ABNORMAL HIGH (ref 4.0–10.5)
nRBC: 0 % (ref 0.0–0.2)

## 2018-06-15 LAB — CBG MONITORING, ED
GLUCOSE-CAPILLARY: 57 mg/dL — AB (ref 70–99)
Glucose-Capillary: 128 mg/dL — ABNORMAL HIGH (ref 70–99)

## 2018-06-15 LAB — ETHANOL: Alcohol, Ethyl (B): <10 mg/dL (ref <10)

## 2018-06-15 MED ORDER — DEXTROSE 50 % IV SOLN
INTRAVENOUS | Status: AC
  Filled 2018-06-15: qty 50

## 2018-06-15 MED ORDER — DEXTROSE 50 % IV SOLN
1.0000 | Freq: Once | INTRAVENOUS | Status: AC
  Administered 2018-06-15: 50 mL via INTRAVENOUS

## 2018-06-15 MED ORDER — ONDANSETRON HCL 4 MG/2ML IJ SOLN
4.0000 mg | Freq: Once | INTRAMUSCULAR | Status: AC
  Administered 2018-06-15: 4 mg via INTRAVENOUS
  Filled 2018-06-15: qty 2

## 2018-06-15 NOTE — Discharge Instructions (Addendum)
Please follow-up with your primary care provider or your endocrinologist regarding your insulin regimen see you do not continues to have low blood sugar. Please return to the ED if you start to have worsening symptoms, head injuries or falls, chest pain, shortness of breath.

## 2018-06-15 NOTE — ED Notes (Signed)
Bed: WA03 Expected date:  Expected time:  Means of arrival:  Comments: triage 

## 2018-06-15 NOTE — ED Triage Notes (Signed)
PT BIB by POV escorted with son who states that pt was not responsive. Pt was pulled out of car, and was not able to stand pt had marked confusion. Pt son states that he has a HX of DM and has had  Episodes in the past of hypoglycemia with similar symptoms. Pt son is unable to disclose PMHX information and son states denies any drug or alcohol use.  Pt son reports vomiting. Pt is arousal with tactile stimulation.

## 2018-06-15 NOTE — ED Notes (Signed)
Patient is leaving AMA, RN and PA described the medical risks and importance of checking CBG. Patient verbalized understanding and said he would take the sandwich with him.

## 2018-06-15 NOTE — ED Notes (Signed)
Patient given PO fluids and food, encouraged to drink, but patient does not want to comply. Situation explained to patient that if he does not eat, his sugar will drop again and patient said, "I don't care, I want to leave." Soup and sandwich at bedside.

## 2018-06-15 NOTE — ED Provider Notes (Signed)
[facility] DEPT Provider Note   CSN: [id number] Arrival date & time: 06/15/18  2035    History   Chief Complaint Chief Complaint  Patient presents with  . Altered Mental Status  . Hypoglycemia    HPI [name] is a 63 y.o. male with a past medical history of IDDM, hypertension who presents to ED with a chief complaint of altered mental status.  Majority of history is provided by son at bedside.  States that patient took his usual dose of insulin this morning.  He last ate a meal at a fast food restaurant about 4 hours ago.  He had emesis 1 hour after that.  He was then found unresponsive in his car.  Son states that he "always gets like this when his sugar is low."  States that he has been trying to get the patient to follow-up with his diabetes coordinator about changing his insulin but she has not been able to do so.  Patient was not complaining of any pain or had any other concerns today.     HPI  Past Medical History:  Diagnosis Date  . DEPRESSION   . DIABETES MELLITUS, TYPE I   . DRUG ABUSE    pt should have NO controlled substances rx'ed  . Glaucoma   . HEPATITIS C    chronic  . Hypertension 02/18/2011  . 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 Vitiligo     Patient Active Problem List   Diagnosis Date Noted  . At risk for adverse drug event 02/17/2018  . Abrasions of multiple sites   . Demand ischemia of myocardium ([place]) 02/05/2018  . Acute metabolic encephalopathy 25/85/2778  . DKA (diabetic ketoacidoses) ([place]) 02/03/2018  . Leukocytosis 02/03/2018  . Hypoglycemia 12/24/2017  . Syncope 12/24/2017  . Syncope and collapse 12/23/2017  . IV drug abuse ([place]) 04/19/2017  . Acute encephalopathy 04/19/2017  . Hemangioma 10/02/2016  . Foreign body (FB) in soft tissue   . 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[place])   . Gangrene ([place])   . Type I (juvenile type) diabetes mellitus without mention of complication, not stated as  uncontrolled   . Tobacco abuse   . Essential hypertension   . Diabetic infection of right foot ([place]) 07/22/2016  . Diabetic foot infection ([place]) 07/22/2016  . Chronic ulcer of heel, right, with fat layer exposed ([place]) 05/20/2016  . DKA, type 1 ([place]) 04/18/2016  . Acute kidney injury ([place]) 04/18/2016  . Hyponatremia 04/18/2016  . Severe protein-calorie malnutrition ([place]) 04/18/2016  . Elevated troponin 04/18/2016  . Poor dentition 07/12/2015  . Tobacco use 07/06/2013  . PAD (peripheral artery disease) ([place]) 04/29/2013  . COPD, mild ([place])   . 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 Diabetic neuropathy ([place]) 06/21/2010  . PROLIFERATIVE DIABETIC RETINOPATHY 06/21/2010  . SKIN TAG 01/30/2010  . Chronic hepatitis C ([place]) 11/22/2008  . VITILIGO 11/22/2008  . PROTEINURIA, MILD 11/22/2008  . Substance abuse ([place]) 04/23/2007  . DEPRESSION 11/11/2006    Past Surgical History:  Procedure Laterality Date  . ABDOMINAL AORTOGRAM W/LOWER EXTREMITY N/A 07/25/2016   Procedure: Abdominal Aortogram w/Bilateral Lower Extremity Runoff;  Surgeon: Conrad [place], MD;  Location: [facility];  Service: Cardiovascular;  Laterality: N/A;  . ABDOMINAL AORTOGRAM W/LOWER EXTREMITY N/A 12/24/2016   Procedure: ABDOMINAL AORTOGRAM W/LOWER EXTREMITY;  Surgeon: Serafina Mitchell, MD;  Location: [facility];  Service: Cardiovascular;  Laterality: N/A;  .  AMPUTATION TOE Right 07/26/2016   Procedure: AMPUTATION GREAT TOE;  Surgeon: Serafina Mitchell, MD;  Location: [facility];  Service: Vascular;  Laterality: Right;  . EYE SURGERY     retinal surgery x 2, right eye  . INSERTION OF ILIAC STENT Right 07/26/2016   Procedure: INSERTION OF RIGHT POPLITEAL STENT WITH BALLOON ANGIOPLASTY;  Surgeon: Serafina Mitchell, MD;  Location: [facility];  Service: Vascular;  Laterality: Right;  . LOWER EXTREMITY ANGIOGRAM Right 07/26/2016   Procedure: LOWER EXTREMITY ANGIOGRAM;  Surgeon: Serafina Mitchell, MD;  Location: [facility];  Service: Vascular;  Laterality: Right;  . PERIPHERAL VASCULAR BALLOON ANGIOPLASTY Right 07/26/2016   Procedure: PERIPHERAL VASCULAR BALLOON ANGIOPLASTY  RIGHT ANTERIOR TIBEAL ARTERY AND RIGHT SUPERFICIAL FEMORAL ARTERY;  Surgeon: Serafina Mitchell, MD;  Location: [facility];  Service: Vascular;  Laterality: Right;  . PERIPHERAL VASCULAR INTERVENTION Right 12/24/2016   Procedure: PERIPHERAL VASCULAR INTERVENTION;  Surgeon: Serafina Mitchell, MD;  Location: [facility];  Service: Cardiovascular;  Laterality: Right;  lower extr        Home Medications    Prior to Admission medications   Medication Sig Start Date End Date Taking? Authorizing Provider  amLODipine (NORVASC) 10 MG tablet Take 1 tablet (10 mg total) by mouth daily. 02/27/18 03/29/18  Medina-Vargas, Monina C, NP  aspirin 81 MG chewable tablet Chew 1 tablet (81 mg total) by mouth daily. 02/12/18   Hosie Poisson, MD  benzonatate (TESSALON) 200 MG capsule Take 1 capsule (200 mg total) by mouth 3 (three) times daily as needed for cough. 02/27/18   Medina-Vargas, Monina C, NP  blood glucose meter kit and supplies KIT Dispense based on patient and insurance preference. Use up to four times daily as directed. (FOR ICD-9 250.00, 250.01). 02/27/18   Medina-Vargas, Monina C, NP  gabapentin (NEURONTIN) 300 MG capsule Take 1 capsule (300 mg total) by mouth every 6 (six) hours. 02/27/18   Medina-Vargas, Monina C, NP  hydrALAZINE (APRESOLINE) 25 MG tablet Take 1 tablet (25 mg total) by mouth every 8 (eight) hours. 02/27/18   Medina-Vargas, Monina C, NP  hydrocortisone (ANUSOL-HC) 2.5 % rectal cream Apply rectally 2 times daily 02/12/18 02/12/19  Hosie Poisson, MD  insulin aspart (NOVOLOG) 100 UNIT/ML injection CBG 70 - 120: 0 units CBG 121 - 150: 3 units CBG 151 - 200: 4 units CBG 201 - 250: 7 units CBG 251 - 300: 11 units CBG 301 - 350: 15 units CBG 351 - 400: 20 units 02/11/18   Hosie Poisson, MD  insulin aspart  (NOVOLOG) 100 UNIT/ML injection Inject 12-20 Units into the skin 2 (two) times daily. 100-200=12 units, 201-300=15 units, >300=20 units.    [provider]  insulin glargine (LANTUS) 100 UNIT/ML injection Inject 0.25 mLs (25 Units total) into the skin daily. 02/12/18   Hosie Poisson, MD  Isopropyl Alcohol Wipes 70 % MISC Apply 1 each topically daily as needed. 02/27/18   Medina-Vargas, Monina C, NP  Lancets MISC 1 each by Does not apply route daily as needed. 02/27/18   Medina-Vargas, Monina C, NP  magnesium hydroxide (MILK OF MAGNESIA) 400 MG/5ML suspension Take 30 mLs by mouth daily as needed for mild constipation.    [provider]  metoCLOPramide (REGLAN) 5 MG tablet Take 1 tablet (5 mg total) by mouth 3 (three) times daily. 02/27/18 03/29/18  Medina-Vargas, Monina C, NP  pantoprazole (PROTONIX) 40 MG tablet Take 1 tablet (40 mg total) by mouth daily at 6 (six) AM.  02/27/18   Medina-Vargas, Monina C, NP  Sodium Phosphates (RA SALINE ENEMA) 19-7 GM/118ML ENEM Place 118 mLs rectally daily as needed (constipation).    [provider]    Family History Family History  Problem Relation Age of Onset  . Diabetes Father   . Kidney disease Father   . Arthritis Mother   . Heart disease Mother        CAD    Social History Social History   Tobacco Use  . Smoking status: Current Every Day Smoker    Packs/day: 0.50    Types: Cigarettes  . Smokeless tobacco: Never Used  Substance Use Topics  . Alcohol use: No    Alcohol/week: 0.0 standard drinks    Comment: 08/14/14 none  . Drug use: Yes    Types: "Crack" cocaine, Marijuana, Heroin    Comment: s/p rehab x 6, occasional drug use (per pt last  use was 06/2015)     Allergies   Codeine   Review of Systems Review of Systems  Unable to perform ROS: Acuity of condition     Physical Exam Updated Vital Signs Pulse 74   Temp (!) 97.5 F (36.4 C) (Oral)   Resp (!) 24   SpO2 100%   Physical Exam Vitals signs  and nursing note reviewed.  Constitutional:      General: He is not in acute distress.    Appearance: He is well-developed.  HENT:     Head: Normocephalic and atraumatic.     Nose: Nose normal.  Eyes:     General: No scleral icterus.       Left eye: No discharge.     Conjunctiva/sclera: Conjunctivae normal.  Neck:     Musculoskeletal: Normal range of motion and neck supple.  Cardiovascular:     Rate and Rhythm: Normal rate and regular rhythm.     Heart sounds: Normal heart sounds. No murmur. No friction rub. No gallop.   Pulmonary:     Effort: Pulmonary effort is normal. No respiratory distress.     Breath sounds: Wheezing present.  Abdominal:     General: Bowel sounds are normal. There is no distension.     Palpations: Abdomen is soft.     Tenderness: There is no abdominal tenderness. There is no guarding.  Musculoskeletal: Normal range of motion.  Skin:    General: Skin is warm and dry.     Findings: No rash.  Neurological:     Mental Status: He is alert.     Motor: No abnormal muscle tone.     Coordination: Coordination normal.      ED Treatments / Results  Labs (all labs ordered are listed, but only abnormal results are displayed) Labs Reviewed  CBC WITH DIFFERENTIAL/PLATELET - Abnormal; Notable for the following components:      Result Value   WBC 10.8 (*)    Platelets 444 (*)    Neutro Abs 8.5 (*)    All other components within normal limits  CBG MONITORING, ED - Abnormal; Notable for the following components:   Glucose-Capillary 57 (*)    All other components within normal limits  COMPREHENSIVE METABOLIC PANEL  ETHANOL    EKG EKG Interpretation  Date/Time:  Monday June 15 2018 20:44:48 EST Ventricular Rate:  74 PR Interval:    QRS Duration: 99 QT Interval:  411 QTC Calculation: 456 R Axis:   -77 Text Interpretation:  Sinus rhythm Consider left atrial enlargement LAD, consider left anterior fascicular block Probable anterior  infarct, old Repol  abnrm suggests ischemia, lateral leads No significant change since last tracing Confirmed by Wandra Arthurs [phone]) on 06/15/2018 8:53:04 PM   Radiology Dg Chest Portable 1 View  Result Date: 06/15/2018 CLINICAL DATA:  Altered mental status EXAM: PORTABLE CHEST 1 VIEW COMPARISON:  02/09/2018 FINDINGS: The heart size and mediastinal contours are within normal limits. Both lungs are clear. Old left-sided rib fractures. IMPRESSION: No active disease. Electronically Signed   By: Donavan Foil M.D.   On: 06/15/2018 21:05    Procedures Procedures (including critical care time)  Medications Ordered in ED Medications  dextrose 50 % solution (has no administration in time range)  dextrose 50 % solution 50 mL (50 mLs Intravenous Given 06/15/18 2048)  ondansetron (ZOFRAN) injection 4 mg (4 mg Intravenous Given 06/15/18 2059)     Initial Impression / Assessment and Plan / ED Course  I have reviewed the triage vital signs and the nursing notes.  Pertinent labs & imaging results that were available during my care of the patient were reviewed by me and considered in my medical decision making (see chart for details).  Clinical Course as of Jun 15 2115  Mon Jun 15, 2018  2113 Patient more responsive after D50.  States that he does not want to eat a sandwich.  He is requesting a blanket.   [HK]    Clinical Course User Index [HK] Delia Heady, PA-C       63 year old male presents to ED for altered mental status.  He has a history of insulin-dependent diabetes.  Initial CBG was 57.  Son at bedside states that this is happened to him in the past when he is hypoglycemic.  Patient given D50.  He is now back to baseline.  He is requesting discharge.  CBG improved to 128. He does not want to stay for remainder of lab work.  I have discussed my concerns as a provider and the possibility that this may worsen. We discussed the nature, risks and benefits, and alternatives to treatment. I have specifically discussed  that without further evaluation I cannot guarantee there is not a life threatening event occuring.  Time was given to allow the opportunity to ask questions and consider the options, and after the discussion, the patient decided to refuse the offered treatment. Patient is alert and oriented x4, their own POA and states understanding of my concerns and the possible consequences. After refusal, I made every reasonable opportunity to treat them to the best of my ability. I have made the patient aware that this is an [place] discharge, but he may return at any time for further evaluation and treatment.   Final Clinical Impressions(s) / ED Diagnoses   Final diagnoses:  Hypoglycemia    ED Discharge Orders    None       Delia Heady, PA-C 06/15/18 2123    Drenda Freeze, MD 06/16/18 1130

## 2018-07-01 ENCOUNTER — Encounter (HOSPITAL_COMMUNITY): Payer: Self-pay | Admitting: Emergency Medicine

## 2018-07-01 ENCOUNTER — Emergency Department (HOSPITAL_COMMUNITY)
Admission: EM | Admit: 2018-07-01 | Discharge: 2018-07-01 | Disposition: A | Discharge status: Home / Self Care | Payer: Medicare HMO | Attending: Emergency Medicine | Admitting: Emergency Medicine

## 2018-07-01 ENCOUNTER — Other Ambulatory Visit: Payer: Self-pay

## 2018-07-01 DIAGNOSIS — Z7982 Long term (current) use of aspirin: Secondary | ICD-10-CM | POA: Diagnosis not present

## 2018-07-01 DIAGNOSIS — E162 Hypoglycemia, unspecified: Secondary | ICD-10-CM

## 2018-07-01 DIAGNOSIS — Z794 Long term (current) use of insulin: Secondary | ICD-10-CM | POA: Diagnosis not present

## 2018-07-01 DIAGNOSIS — E10641 Type 1 diabetes mellitus with hypoglycemia with coma: Secondary | ICD-10-CM | POA: Insufficient documentation

## 2018-07-01 DIAGNOSIS — F17210 Nicotine dependence, cigarettes, uncomplicated: Secondary | ICD-10-CM | POA: Diagnosis not present

## 2018-07-01 DIAGNOSIS — I10 Essential (primary) hypertension: Secondary | ICD-10-CM | POA: Diagnosis not present

## 2018-07-01 DIAGNOSIS — Z79899 Other long term (current) drug therapy: Secondary | ICD-10-CM | POA: Insufficient documentation

## 2018-07-01 DIAGNOSIS — J449 Chronic obstructive pulmonary disease, unspecified: Secondary | ICD-10-CM | POA: Diagnosis not present

## 2018-07-01 DIAGNOSIS — E10649 Type 1 diabetes mellitus with hypoglycemia without coma: Secondary | ICD-10-CM | POA: Diagnosis not present

## 2018-07-01 LAB — COMPREHENSIVE METABOLIC PANEL
ALT: 19 U/L (ref 0–44)
AST: 26 U/L (ref 15–41)
Albumin: 3.3 g/dL — ABNORMAL LOW (ref 3.5–5.0)
Alkaline Phosphatase: 98 U/L (ref 38–126)
Anion gap: 8 (ref 5–15)
BUN: 16 mg/dL (ref 8–23)
CHLORIDE: 105 mmol/L (ref 98–111)
CO2: 29 mmol/L (ref 22–32)
Calcium: 9.6 mg/dL (ref 8.9–10.3)
Creatinine, Ser: 1.31 mg/dL — ABNORMAL HIGH (ref 0.61–1.24)
GFR calc Af Amer: >60 mL/min (ref >60)
GFR calc non Af Amer: 58 mL/min — ABNORMAL LOW (ref >60)
Glucose, Bld: 26 mg/dL — CL (ref 70–99)
Potassium: 3.6 mmol/L (ref 3.5–5.1)
Sodium: 142 mmol/L (ref 135–145)
Total Bilirubin: 0.4 mg/dL (ref 0.3–1.2)
Total Protein: 7.2 g/dL (ref 6.5–8.1)

## 2018-07-01 LAB — ETHANOL: Alcohol, Ethyl (B): <10 mg/dL (ref <10)

## 2018-07-01 LAB — CBC WITH DIFFERENTIAL/PLATELET
ABS IMMATURE GRANULOCYTES: 0.04 10*3/uL (ref 0.00–0.07)
Basophils Absolute: 0.1 10*3/uL (ref 0.0–0.1)
Basophils Relative: 1 %
Eosinophils Absolute: 0.1 10*3/uL (ref 0.0–0.5)
Eosinophils Relative: 1 %
HCT: 40.4 % (ref 39.0–52.0)
Hemoglobin: 13.2 g/dL (ref 13.0–17.0)
IMMATURE GRANULOCYTES: 0 %
Lymphocytes Relative: 15 %
Lymphs Abs: 1.4 10*3/uL (ref 0.7–4.0)
MCH: 29.3 pg (ref 26.0–34.0)
MCHC: 32.7 g/dL (ref 30.0–36.0)
MCV: 89.8 fL (ref 80.0–100.0)
MONOS PCT: 8 %
Monocytes Absolute: 0.8 10*3/uL (ref 0.1–1.0)
NEUTROS ABS: 6.9 10*3/uL (ref 1.7–7.7)
NEUTROS PCT: 75 %
Platelets: 425 10*3/uL — ABNORMAL HIGH (ref 150–400)
RBC: 4.5 MIL/uL (ref 4.22–5.81)
RDW: 13.4 % (ref 11.5–15.5)
WBC: 9.3 10*3/uL (ref 4.0–10.5)
nRBC: 0 % (ref 0.0–0.2)

## 2018-07-01 LAB — I-STAT TROPONIN, ED: Troponin i, poc: 0.01 ng/mL (ref 0.00–0.08)

## 2018-07-01 LAB — CBG MONITORING, ED
Glucose-Capillary: 176 mg/dL — ABNORMAL HIGH (ref 70–99)
Glucose-Capillary: 98 mg/dL (ref 70–99)

## 2018-07-01 MED ORDER — SODIUM CHLORIDE 0.9 % IV BOLUS
1000.0000 mL | Freq: Once | INTRAVENOUS | Status: AC
  Administered 2018-07-01: 1000 mL via INTRAVENOUS

## 2018-07-01 MED ORDER — GLUCAGON HCL RDNA (DIAGNOSTIC) 1 MG IJ SOLR
1.0000 mg | Freq: Once | INTRAMUSCULAR | Status: AC
  Administered 2018-07-01: 1 mg via INTRAVENOUS
  Filled 2018-07-01: qty 1

## 2018-07-01 MED ORDER — DEXTROSE 50 % IV SOLN
INTRAVENOUS | Status: AC
  Filled 2018-07-01: qty 50

## 2018-07-01 MED ORDER — DEXTROSE 50 % IV SOLN
50.0000 mL | Freq: Once | INTRAVENOUS | Status: AC
  Administered 2018-07-01: 50 mL via INTRAVENOUS

## 2018-07-01 NOTE — ED Notes (Signed)
Pt stating he wants to leave and doesn't want to be here anymore. Last CBG was 98.

## 2018-07-01 NOTE — ED Notes (Signed)
Date and time results received: 07/01/18 1915 (use smartphrase ".now" to insert current time)  Test: glucose Critical Value: 18  Name of Provider Notified: Dr. Darl Householder  Orders Received? Or Actions Taken?: notified Dr Darl Householder of glucose 26.

## 2018-07-01 NOTE — ED Triage Notes (Signed)
Patient BIB family, CBG 17 and responsive to pain upon arrival. Hx diabetes and neuropathy per family.

## 2018-07-01 NOTE — ED Provider Notes (Signed)
[facility] DEPT Provider Note   CSN: [id number] Arrival date & time: 07/01/18  1801    History   Chief Complaint Chief Complaint  Patient presents with  . Hypoglycemia    HPI [name] is a 63 y.o. male hx of DM, hepatitis C, hypertension, here presenting with altered mental status.  Patient states that he did not eat anything but was using his insulin this morning.  He was found unresponsive by his son and brought him in.  Initially his blood sugar was 26 and patient was unable to give any history.  Patient was seen here several days ago for hypoglycemia as well. Patient was altered on arrival and unable to give history. After he was given D50, he states that he just didn't eat anything. Denies any fever or vomiting or abdominal pain.      The history is provided by the patient and a relative.  Level  V caveat- AMS   Past Medical History:  Diagnosis Date  . DEPRESSION   . DIABETES MELLITUS, TYPE I   . DRUG ABUSE    pt should have NO controlled substances rx'ed  . Glaucoma   . HEPATITIS C    chronic  . Hypertension 02/18/2011  . 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 Vitiligo     Patient Active Problem List   Diagnosis Date Noted  . At risk for adverse drug event 02/17/2018  . Abrasions of multiple sites   . Demand ischemia of myocardium ([place]) 02/05/2018  . Acute metabolic encephalopathy 88/50/2774  . DKA (diabetic ketoacidoses) ([place]) 02/03/2018  . Leukocytosis 02/03/2018  . Hypoglycemia 12/24/2017  . Syncope 12/24/2017  . Syncope and collapse 12/23/2017  . IV drug abuse ([place]) 04/19/2017  . Acute encephalopathy 04/19/2017  . Hemangioma 10/02/2016  . Foreign body (FB) in soft tissue   . 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[place])   . Gangrene ([place])   . Type I (juvenile type) diabetes mellitus without mention of complication, not stated as uncontrolled   . Tobacco abuse   . Essential hypertension   . Diabetic infection of  right foot ([place]) 07/22/2016  . Diabetic foot infection ([place]) 07/22/2016  . Chronic ulcer of heel, right, with fat layer exposed ([place]) 05/20/2016  . DKA, type 1 ([place]) 04/18/2016  . Acute kidney injury ([place]) 04/18/2016  . Hyponatremia 04/18/2016  . Severe protein-calorie malnutrition ([place]) 04/18/2016  . Elevated troponin 04/18/2016  . Poor dentition 07/12/2015  . Tobacco use 07/06/2013  . PAD (peripheral artery disease) ([place]) 04/29/2013  . COPD, mild ([place])   . 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 Diabetic neuropathy ([place]) 06/21/2010  . PROLIFERATIVE DIABETIC RETINOPATHY 06/21/2010  . SKIN TAG 01/30/2010  . Chronic hepatitis C ([place]) 11/22/2008  . VITILIGO 11/22/2008  . PROTEINURIA, MILD 11/22/2008  . Substance abuse ([place]) 04/23/2007  . DEPRESSION 11/11/2006    Past Surgical History:  Procedure Laterality Date  . ABDOMINAL AORTOGRAM W/LOWER EXTREMITY N/A 07/25/2016   Procedure: Abdominal Aortogram w/Bilateral Lower Extremity Runoff;  Surgeon: Conrad Faunsdale, MD;  Location: [facility];  Service: Cardiovascular;  Laterality: N/A;  . ABDOMINAL AORTOGRAM W/LOWER EXTREMITY N/A 12/24/2016   Procedure: ABDOMINAL AORTOGRAM W/LOWER EXTREMITY;  Surgeon: Serafina Mitchell, MD;  Location: [facility];  Service: Cardiovascular;  Laterality: N/A;  . AMPUTATION TOE Right 07/26/2016   Procedure: AMPUTATION GREAT TOE;  Surgeon: Serafina Mitchell, MD;  Location: [facility];  Service:  Vascular;  Laterality: Right;  . EYE SURGERY     retinal surgery x 2, right eye  . INSERTION OF ILIAC STENT Right 07/26/2016   Procedure: INSERTION OF RIGHT POPLITEAL STENT WITH BALLOON ANGIOPLASTY;  Surgeon: Serafina Mitchell, MD;  Location: [facility];  Service: Vascular;  Laterality: Right;  . LOWER EXTREMITY ANGIOGRAM Right 07/26/2016   Procedure: LOWER EXTREMITY ANGIOGRAM;  Surgeon: Serafina Mitchell, MD;  Location: [facility];  Service: Vascular;  Laterality: Right;  . PERIPHERAL  VASCULAR BALLOON ANGIOPLASTY Right 07/26/2016   Procedure: PERIPHERAL VASCULAR BALLOON ANGIOPLASTY  RIGHT ANTERIOR TIBEAL ARTERY AND RIGHT SUPERFICIAL FEMORAL ARTERY;  Surgeon: Serafina Mitchell, MD;  Location: [facility];  Service: Vascular;  Laterality: Right;  . PERIPHERAL VASCULAR INTERVENTION Right 12/24/2016   Procedure: PERIPHERAL VASCULAR INTERVENTION;  Surgeon: Serafina Mitchell, MD;  Location: [facility];  Service: Cardiovascular;  Laterality: Right;  lower extr        Home Medications    Prior to Admission medications   Medication Sig Start Date End Date Taking? Authorizing Provider  amLODipine (NORVASC) 10 MG tablet Take 1 tablet (10 mg total) by mouth daily. 02/27/18 03/29/18  Medina-Vargas, Monina C, NP  aspirin 81 MG chewable tablet Chew 1 tablet (81 mg total) by mouth daily. 02/12/18   Hosie Poisson, MD  benzonatate (TESSALON) 200 MG capsule Take 1 capsule (200 mg total) by mouth 3 (three) times daily as needed for cough. 02/27/18   Medina-Vargas, Monina C, NP  blood glucose meter kit and supplies KIT Dispense based on patient and insurance preference. Use up to four times daily as directed. (FOR ICD-9 250.00, 250.01). 02/27/18   Medina-Vargas, Monina C, NP  gabapentin (NEURONTIN) 300 MG capsule Take 1 capsule (300 mg total) by mouth every 6 (six) hours. 02/27/18   Medina-Vargas, Monina C, NP  hydrALAZINE (APRESOLINE) 25 MG tablet Take 1 tablet (25 mg total) by mouth every 8 (eight) hours. 02/27/18   Medina-Vargas, Monina C, NP  hydrocortisone (ANUSOL-HC) 2.5 % rectal cream Apply rectally 2 times daily 02/12/18 02/12/19  Hosie Poisson, MD  insulin aspart (NOVOLOG) 100 UNIT/ML injection CBG 70 - 120: 0 units CBG 121 - 150: 3 units CBG 151 - 200: 4 units CBG 201 - 250: 7 units CBG 251 - 300: 11 units CBG 301 - 350: 15 units CBG 351 - 400: 20 units 02/11/18   Hosie Poisson, MD  insulin aspart (NOVOLOG) 100 UNIT/ML injection Inject 12-20 Units into the skin 2 (two) times daily.  100-200=12 units, 201-300=15 units, >300=20 units.    [provider]  insulin glargine (LANTUS) 100 UNIT/ML injection Inject 0.25 mLs (25 Units total) into the skin daily. 02/12/18   Hosie Poisson, MD  Isopropyl Alcohol Wipes 70 % MISC Apply 1 each topically daily as needed. 02/27/18   Medina-Vargas, Monina C, NP  Lancets MISC 1 each by Does not apply route daily as needed. 02/27/18   Medina-Vargas, Monina C, NP  magnesium hydroxide (MILK OF MAGNESIA) 400 MG/5ML suspension Take 30 mLs by mouth daily as needed for mild constipation.    [provider]  metoCLOPramide (REGLAN) 5 MG tablet Take 1 tablet (5 mg total) by mouth 3 (three) times daily. 02/27/18 03/29/18  Medina-Vargas, Monina C, NP  pantoprazole (PROTONIX) 40 MG tablet Take 1 tablet (40 mg total) by mouth daily at 6 (six) AM. 02/27/18   Medina-Vargas, Monina C, NP  Sodium Phosphates (RA SALINE ENEMA) 19-7 GM/118ML ENEM Place 118 mLs rectally daily as  needed (constipation).    [provider]    Family History Family History  Problem Relation Age of Onset  . Diabetes Father   . Kidney disease Father   . Arthritis Mother   . Heart disease Mother        CAD    Social History Social History   Tobacco Use  . Smoking status: Current Every Day Smoker    Packs/day: 0.50    Types: Cigarettes  . Smokeless tobacco: Never Used  Substance Use Topics  . Alcohol use: No    Alcohol/week: 0.0 standard drinks    Comment: 08/14/14 none  . Drug use: Yes    Types: "Crack" cocaine, Marijuana, Heroin    Comment: s/p rehab x 6, occasional drug use (per pt last  use was 06/2015)     Allergies   Codeine   Review of Systems Review of Systems  Unable to perform ROS: Mental status change  All other systems reviewed and are negative.    Physical Exam Updated Vital Signs BP (!) 197/92   Pulse 78   Resp 14   SpO2 100%   Physical Exam Vitals signs and nursing note reviewed.  Constitutional:      Comments:  Altered, confused   HENT:     Head: Normocephalic.     Nose: Nose normal.     Mouth/Throat:     Mouth: Mucous membranes are moist.  Eyes:     Extraocular Movements: Extraocular movements intact.     Pupils: Pupils are equal, round, and reactive to light.  Neck:     Musculoskeletal: Normal range of motion.  Cardiovascular:     Rate and Rhythm: Normal rate.  Pulmonary:     Effort: Pulmonary effort is normal.     Breath sounds: Normal breath sounds.  Abdominal:     General: Abdomen is flat.     Palpations: Abdomen is soft.  Musculoskeletal: Normal range of motion.  Skin:    General: Skin is warm.     Capillary Refill: Capillary refill takes less than 2 seconds.  Neurological:     General: No focal deficit present.     Mental Status: He is alert.     Comments: Altered. Confused. Moving all extremities   Psychiatric:        Mood and Affect: Mood normal.        Behavior: Behavior normal.      ED Treatments / Results  Labs (all labs ordered are listed, but only abnormal results are displayed) Labs Reviewed  CBC WITH DIFFERENTIAL/PLATELET - Abnormal; Notable for the following components:      Result Value   Platelets 425 (*)    All other components within normal limits  COMPREHENSIVE METABOLIC PANEL - Abnormal; Notable for the following components:   Glucose, Bld 26 (*)    Creatinine, Ser 1.31 (*)    Albumin 3.3 (*)    GFR calc non Af Amer 58 (*)    All other components within normal limits  ETHANOL  RAPID URINE DRUG SCREEN, HOSP PERFORMED  ACETAMINOPHEN LEVEL  SALICYLATE LEVEL  I-STAT TROPONIN, ED  CBG MONITORING, ED    EKG EKG Interpretation  Date/Time:  Wednesday July 01 2018 18:04:43 EDT Ventricular Rate:  67 PR Interval:    QRS Duration: 105 QT Interval:  448 QTC Calculation: 473 R Axis:   -5 Text Interpretation:  Sinus rhythm Consider right atrial enlargement Probable anteroseptal infarct, old No significant change since last tracing Confirmed by Darl Householder,  Fenton Malling ([id number]) on 07/01/2018 6:24:30 PM   Radiology No results found.  Procedures Procedures (including critical care time)  Medications Ordered in ED Medications  dextrose 50 % solution (has no administration in time range)  glucagon (human recombinant) (GLUCAGEN) injection 1 mg (1 mg Intravenous Given 07/01/18 1811)  dextrose 50 % solution 50 mL (50 mLs Intravenous Given 07/01/18 1810)  sodium chloride 0.9 % bolus 1,000 mL (0 mLs Intravenous Stopped 07/01/18 1919)     Initial Impression / Assessment and Plan / ED Course  I have reviewed the triage vital signs and the nursing notes.  Pertinent labs & imaging results that were available during my care of the patient were reviewed by me and considered in my medical decision making (see chart for details).       HUCK ASHWORTH is a 63 y.o. male here with AMS. Glucose was 26 initially. Will give D50 and check labs and PO trial and reassess.   8:34 PM  CBG stable at 98 after 3 hr observation. Tolerated PO. Told him to eat normally. Stable for discharge   Final Clinical Impressions(s) / ED Diagnoses   Final diagnoses:  None    ED Discharge Orders    None       Drenda Freeze, MD 07/01/18 2034

## 2018-07-01 NOTE — ED Notes (Signed)
Patient given warm blankets and OJ.

## 2018-07-01 NOTE — ED Notes (Addendum)
Unable to obtain oral or axillary temp. Patient refusing rectal temp. Darl Householder, MD made aware patient feels cold. Per MD try taking temp later.

## 2018-07-01 NOTE — ED Notes (Signed)
CBG-176  CBG monitor not crossing over.

## 2018-07-01 NOTE — ED Notes (Signed)
Fast CBG: 98mg /dL

## 2018-07-01 NOTE — Discharge Instructions (Signed)
Make sure that you eat normally.   Take your insulin and please eat afterwards.   See your doctor  Return to ER if you are confused, lethargy, chest pain, trouble breathing

## 2018-07-01 NOTE — ED Notes (Addendum)
Pt verbalized discharge instructions and follow up care. Alert and ambulatory. Leaving with Son

## 2018-07-02 LAB — CBG MONITORING, ED: Glucose-Capillary: 17 mg/dL — CL (ref 70–99)

## 2018-08-11 ENCOUNTER — Inpatient Hospital Stay (HOSPITAL_COMMUNITY)
Admission: EM | Admit: 2018-08-11 | Discharge: 2018-08-15 | DRG: 918 | Disposition: A | Discharge status: Home Health | Payer: Medicare HMO | Attending: Internal Medicine | Admitting: Internal Medicine

## 2018-08-11 DIAGNOSIS — Z95820 Peripheral vascular angioplasty status with implants and grafts: Secondary | ICD-10-CM

## 2018-08-11 DIAGNOSIS — E1051 Type 1 diabetes mellitus with diabetic peripheral angiopathy without gangrene: Secondary | ICD-10-CM | POA: Diagnosis present

## 2018-08-11 DIAGNOSIS — E1065 Type 1 diabetes mellitus with hyperglycemia: Secondary | ICD-10-CM | POA: Diagnosis not present

## 2018-08-11 DIAGNOSIS — R1310 Dysphagia, unspecified: Secondary | ICD-10-CM | POA: Diagnosis present

## 2018-08-11 DIAGNOSIS — N179 Acute kidney failure, unspecified: Secondary | ICD-10-CM | POA: Diagnosis present

## 2018-08-11 DIAGNOSIS — T383X1A Poisoning by insulin and oral hypoglycemic [antidiabetic] drugs, accidental (unintentional), initial encounter: Principal | ICD-10-CM | POA: Diagnosis present

## 2018-08-11 DIAGNOSIS — I10 Essential (primary) hypertension: Secondary | ICD-10-CM | POA: Diagnosis not present

## 2018-08-11 DIAGNOSIS — Z91128 Patient's intentional underdosing of medication regimen for other reason: Secondary | ICD-10-CM

## 2018-08-11 DIAGNOSIS — Z79899 Other long term (current) drug therapy: Secondary | ICD-10-CM

## 2018-08-11 DIAGNOSIS — Z720 Tobacco use: Secondary | ICD-10-CM | POA: Diagnosis not present

## 2018-08-11 DIAGNOSIS — E1022 Type 1 diabetes mellitus with diabetic chronic kidney disease: Secondary | ICD-10-CM | POA: Diagnosis not present

## 2018-08-11 DIAGNOSIS — Z89411 Acquired absence of right great toe: Secondary | ICD-10-CM | POA: Diagnosis not present

## 2018-08-11 DIAGNOSIS — E11649 Type 2 diabetes mellitus with hypoglycemia without coma: Secondary | ICD-10-CM | POA: Diagnosis present

## 2018-08-11 DIAGNOSIS — B182 Chronic viral hepatitis C: Secondary | ICD-10-CM | POA: Diagnosis not present

## 2018-08-11 DIAGNOSIS — F17210 Nicotine dependence, cigarettes, uncomplicated: Secondary | ICD-10-CM | POA: Diagnosis present

## 2018-08-11 DIAGNOSIS — D649 Anemia, unspecified: Secondary | ICD-10-CM | POA: Diagnosis present

## 2018-08-11 DIAGNOSIS — F1290 Cannabis use, unspecified, uncomplicated: Secondary | ICD-10-CM | POA: Diagnosis not present

## 2018-08-11 DIAGNOSIS — N183 Chronic kidney disease, stage 3 (moderate): Secondary | ICD-10-CM | POA: Diagnosis present

## 2018-08-11 DIAGNOSIS — Z91199 Patient's noncompliance with other medical treatment and regimen due to unspecified reason: Secondary | ICD-10-CM

## 2018-08-11 DIAGNOSIS — H409 Unspecified glaucoma: Secondary | ICD-10-CM | POA: Diagnosis present

## 2018-08-11 DIAGNOSIS — T383X6A Underdosing of insulin and oral hypoglycemic [antidiabetic] drugs, initial encounter: Secondary | ICD-10-CM | POA: Diagnosis not present

## 2018-08-11 DIAGNOSIS — Z833 Family history of diabetes mellitus: Secondary | ICD-10-CM

## 2018-08-11 DIAGNOSIS — R410 Disorientation, unspecified: Secondary | ICD-10-CM | POA: Diagnosis not present

## 2018-08-11 DIAGNOSIS — S93114A Dislocation of interphalangeal joint of right lesser toe(s), initial encounter: Secondary | ICD-10-CM | POA: Diagnosis not present

## 2018-08-11 DIAGNOSIS — F1410 Cocaine abuse, uncomplicated: Secondary | ICD-10-CM | POA: Diagnosis present

## 2018-08-11 DIAGNOSIS — E162 Hypoglycemia, unspecified: Secondary | ICD-10-CM

## 2018-08-11 DIAGNOSIS — I129 Hypertensive chronic kidney disease with stage 1 through stage 4 chronic kidney disease, or unspecified chronic kidney disease: Secondary | ICD-10-CM | POA: Diagnosis present

## 2018-08-11 DIAGNOSIS — Z885 Allergy status to narcotic agent status: Secondary | ICD-10-CM

## 2018-08-11 DIAGNOSIS — R0782 Intercostal pain: Secondary | ICD-10-CM | POA: Diagnosis not present

## 2018-08-11 DIAGNOSIS — Z9114 Patient's other noncompliance with medication regimen: Secondary | ICD-10-CM

## 2018-08-11 DIAGNOSIS — K0889 Other specified disorders of teeth and supporting structures: Secondary | ICD-10-CM | POA: Diagnosis present

## 2018-08-11 DIAGNOSIS — M79676 Pain in unspecified toe(s): Secondary | ICD-10-CM

## 2018-08-11 DIAGNOSIS — F1910 Other psychoactive substance abuse, uncomplicated: Secondary | ICD-10-CM | POA: Diagnosis not present

## 2018-08-11 DIAGNOSIS — R0902 Hypoxemia: Secondary | ICD-10-CM | POA: Diagnosis not present

## 2018-08-11 DIAGNOSIS — E1040 Type 1 diabetes mellitus with diabetic neuropathy, unspecified: Secondary | ICD-10-CM | POA: Diagnosis present

## 2018-08-11 DIAGNOSIS — Z8249 Family history of ischemic heart disease and other diseases of the circulatory system: Secondary | ICD-10-CM

## 2018-08-11 DIAGNOSIS — E785 Hyperlipidemia, unspecified: Secondary | ICD-10-CM | POA: Diagnosis present

## 2018-08-11 DIAGNOSIS — F1210 Cannabis abuse, uncomplicated: Secondary | ICD-10-CM | POA: Diagnosis present

## 2018-08-11 DIAGNOSIS — E1042 Type 1 diabetes mellitus with diabetic polyneuropathy: Secondary | ICD-10-CM

## 2018-08-11 DIAGNOSIS — R0781 Pleurodynia: Secondary | ICD-10-CM

## 2018-08-11 DIAGNOSIS — E103599 Type 1 diabetes mellitus with proliferative diabetic retinopathy without macular edema, unspecified eye: Secondary | ICD-10-CM | POA: Diagnosis present

## 2018-08-11 DIAGNOSIS — IMO0002 Reserved for concepts with insufficient information to code with codable children: Secondary | ICD-10-CM | POA: Diagnosis present

## 2018-08-11 DIAGNOSIS — Z7982 Long term (current) use of aspirin: Secondary | ICD-10-CM

## 2018-08-11 DIAGNOSIS — E10649 Type 1 diabetes mellitus with hypoglycemia without coma: Secondary | ICD-10-CM | POA: Diagnosis not present

## 2018-08-11 DIAGNOSIS — J449 Chronic obstructive pulmonary disease, unspecified: Secondary | ICD-10-CM | POA: Diagnosis not present

## 2018-08-11 DIAGNOSIS — Z794 Long term (current) use of insulin: Secondary | ICD-10-CM

## 2018-08-11 DIAGNOSIS — K219 Gastro-esophageal reflux disease without esophagitis: Secondary | ICD-10-CM

## 2018-08-11 DIAGNOSIS — Z9119 Patient's noncompliance with other medical treatment and regimen: Secondary | ICD-10-CM

## 2018-08-11 DIAGNOSIS — E161 Other hypoglycemia: Secondary | ICD-10-CM | POA: Diagnosis not present

## 2018-08-11 DIAGNOSIS — E1029 Type 1 diabetes mellitus with other diabetic kidney complication: Secondary | ICD-10-CM | POA: Diagnosis present

## 2018-08-11 LAB — CBC WITH DIFFERENTIAL/PLATELET
Abs Immature Granulocytes: 0.06 10*3/uL (ref 0.00–0.07)
Basophils Absolute: 0.1 10*3/uL (ref 0.0–0.1)
Basophils Relative: 0 %
Eosinophils Absolute: 0 10*3/uL (ref 0.0–0.5)
Eosinophils Relative: 0 %
HCT: 35.2 % — ABNORMAL LOW (ref 39.0–52.0)
Hemoglobin: 11.7 g/dL — ABNORMAL LOW (ref 13.0–17.0)
Immature Granulocytes: 0 %
Lymphocytes Relative: 9 %
Lymphs Abs: 1.3 10*3/uL (ref 0.7–4.0)
MCH: 29.6 pg (ref 26.0–34.0)
MCHC: 33.2 g/dL (ref 30.0–36.0)
MCV: 89.1 fL (ref 80.0–100.0)
Monocytes Absolute: 0.8 10*3/uL (ref 0.1–1.0)
Monocytes Relative: 6 %
Neutro Abs: 12.3 10*3/uL — ABNORMAL HIGH (ref 1.7–7.7)
Neutrophils Relative %: 85 %
Platelets: 408 10*3/uL — ABNORMAL HIGH (ref 150–400)
RBC: 3.95 MIL/uL — ABNORMAL LOW (ref 4.22–5.81)
RDW: 14.2 % (ref 11.5–15.5)
WBC: 14.6 10*3/uL — ABNORMAL HIGH (ref 4.0–10.5)
nRBC: 0 % (ref 0.0–0.2)

## 2018-08-11 LAB — CBG MONITORING, ED
Glucose-Capillary: 44 mg/dL — CL (ref 70–99)
Glucose-Capillary: 113 mg/dL — ABNORMAL HIGH (ref 70–99)
Glucose-Capillary: 72 mg/dL (ref 70–99)
Glucose-Capillary: 37 mg/dL — CL (ref 70–99)

## 2018-08-11 LAB — URINALYSIS, ROUTINE W REFLEX MICROSCOPIC
Bilirubin Urine: NEGATIVE
Glucose, UA: >=500 mg/dL — AB
Hgb urine dipstick: NEGATIVE
Ketones, ur: NEGATIVE mg/dL
Leukocytes,Ua: NEGATIVE
Nitrite: NEGATIVE
Protein, ur: 100 mg/dL — AB
Specific Gravity, Urine: 1.013 (ref 1.005–1.030)
pH: 5 (ref 5.0–8.0)

## 2018-08-11 LAB — RAPID URINE DRUG SCREEN, HOSP PERFORMED
Amphetamines: NOT DETECTED
Barbiturates: NOT DETECTED
Benzodiazepines: NOT DETECTED
Cocaine: POSITIVE — AB
Opiates: NOT DETECTED
Tetrahydrocannabinol: POSITIVE — AB

## 2018-08-11 LAB — BASIC METABOLIC PANEL
Anion gap: 6 (ref 5–15)
BUN: 31 mg/dL — ABNORMAL HIGH (ref 8–23)
CO2: 25 mmol/L (ref 22–32)
Calcium: 8.9 mg/dL (ref 8.9–10.3)
Chloride: 104 mmol/L (ref 98–111)
Creatinine, Ser: 1.44 mg/dL — ABNORMAL HIGH (ref 0.61–1.24)
GFR calc Af Amer: >60 mL/min (ref >60)
GFR calc non Af Amer: 52 mL/min — ABNORMAL LOW (ref >60)
Glucose, Bld: 156 mg/dL — ABNORMAL HIGH (ref 70–99)
Potassium: 5.8 mmol/L — ABNORMAL HIGH (ref 3.5–5.1)
Sodium: 135 mmol/L (ref 135–145)

## 2018-08-11 LAB — MAGNESIUM: Magnesium: 2.5 mg/dL — ABNORMAL HIGH (ref 1.7–2.4)

## 2018-08-11 LAB — ETHANOL: Alcohol, Ethyl (B): <10 mg/dL (ref <10)

## 2018-08-11 MED ORDER — SODIUM CHLORIDE 0.9 % IV SOLN
250.0000 mL | INTRAVENOUS | Status: DC | PRN
  Administered 2018-08-14: 250 mL via INTRAVENOUS

## 2018-08-11 MED ORDER — SODIUM CHLORIDE 0.9% FLUSH
3.0000 mL | INTRAVENOUS | Status: DC | PRN
  Administered 2018-08-11 (×2): 3 mL via INTRAVENOUS
  Filled 2018-08-11 (×2): qty 3

## 2018-08-11 MED ORDER — DEXTROSE 50 % IV SOLN
1.0000 | Freq: Once | INTRAVENOUS | Status: AC
  Administered 2018-08-11: 23:00:00 50 mL via INTRAVENOUS

## 2018-08-11 MED ORDER — DEXTROSE 50 % IV SOLN
INTRAVENOUS | Status: AC
  Filled 2018-08-11: qty 50

## 2018-08-11 MED ORDER — SODIUM CHLORIDE 0.9% FLUSH
3.0000 mL | Freq: Two times a day (BID) | INTRAVENOUS | Status: DC
  Administered 2018-08-11 – 2018-08-14 (×6): 3 mL via INTRAVENOUS

## 2018-08-11 MED ORDER — DEXTROSE 50 % IV SOLN
1.0000 | Freq: Once | INTRAVENOUS | Status: AC
  Administered 2018-08-11: 20:00:00 50 mL via INTRAVENOUS
  Filled 2018-08-11: qty 50

## 2018-08-11 NOTE — ED Notes (Addendum)
BG 72. Gave pt 4oz OJ and 0.75 peanut butter

## 2018-08-11 NOTE — Progress Notes (Addendum)
CSW spoke to pt who stated he is living at a cousin of his son's on [address].  CSW asked pt what his medications needs are and pt stated he has "2-3 shots of insulin left in his home and that he is out of needles.  Pt stated he needs an appointment with his endocrinologist, but states that, "I am a hardhead" and have not made an appointment.  Pt states that he should have enough shots of insulin to get him through until he gets paid until Friday and anticipates his stimulus check that the pt is to get is coming this friday as well.    Pt states his needles are approx 1.35 for needles and has a dollar in his pocket and could get the rest of the 35 cent needed from his son to purchase the needles.  Pt states he has food in the home and was pleased to hear that he can ride a bus for free this month so he can go to [employer] and purchase the needles by bus if he desires.  Pt states, "all I have to do is go to my neurologist to get a glucometer", I just haven't done it yet because I haven't done it yet.    Pt stated he has a son with a car who can take him to the [employer] for needles if he asks his son and then he gets paid Friday.  Pt states he needs an appointment with his endocrinologist Fredrik Rigger at the [facility] across from [place], but doesn't think he has the energy to do it.  9:58 PM CSW spoke to pt's son who is willing to pay for pt's needles and insulin as well as assist him with getting to his endocrinology appt when and if pt goes.  CSW will continue to follow for D/C needs.  Alphonse Guild. Rejoice Heatwole, LCSW, LCAS, CSI Transitions of Care Clinical Social Worker Care Coordination Department Ph: [phone]

## 2018-08-11 NOTE — ED Notes (Signed)
Bed: JK82 Expected date:  Expected time:  Means of arrival:  Comments: 63 yr old hypoglycemia

## 2018-08-11 NOTE — ED Provider Notes (Signed)
[facility] DEPT Provider Note   CSN: [id number] Arrival date & time: 08/11/18  1943    History   Chief Complaint Chief Complaint  Patient presents with   Hypoglycemia    HPI [name] is a 63 y.o. male with history of vitiligo, HTN, chronic hepatitis, type 1 diabetes, polysubstance abuse, depression, medication noncompliance is brought to the ED by EMS for hypoglycemia and altered mental status.  EMS was called at 4 PM and CBG was 31, patient refused transport.  EMS called again at 7 PM for CBG 56.  He was sweaty, lethargic unable to follow commands.  He received 12.5 g of D10 and CBG improved to 222 and brought to ED.  On route he has been asking for food and states he is very hungry, intermittently will fall asleep but easily arousable to voice.  Patient states he takes insulin but cannot remember the name of it.  Thinks he took insulin earlier today but cannot tell me how much.  Family told EMS patient has been inconsistent with his insulin dosing at home, sometimes refusing to eat.  Recently discharged from SNF and apparently has not been able to follow up with a PCP or get his medications refilled.  Does not have a glucometer at home.  Family would like social work consult per EMS.  Patient is oriented to self, place and year.  He is somnolent but arousable to voice.  He denies any pain.  He states he is very hungry.  Difficult historian.  CBG upon arrival is 44.  Level 5 caveat applies.  2018: Attempted to contact son Abe People x3 but unsuccessful.    HPI  Past Medical History:  Diagnosis Date   DEPRESSION    DIABETES MELLITUS, TYPE I    DRUG ABUSE    pt should have NO controlled substances rx'ed   Glaucoma    HEPATITIS C    chronic   Hypertension 02/18/2011   Proliferative diabetic retinopathy(362.02)    Vitiligo     Patient Active Problem List   Diagnosis Date Noted   At risk for adverse drug event 02/17/2018   Abrasions of  multiple sites    Demand ischemia of myocardium ([place]) 29/47/6546   Acute metabolic encephalopathy 50/35/4656   DKA (diabetic ketoacidoses) ([place]) 02/03/2018   Leukocytosis 02/03/2018   Hypoglycemia 12/24/2017   Syncope 12/24/2017   Syncope and collapse 12/23/2017   IV drug abuse ([place]) 04/19/2017   Acute encephalopathy 04/19/2017   Hemangioma 10/02/2016   Foreign body (FB) in soft tissue    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[place])    Gangrene ([place])    Type I (juvenile type) diabetes mellitus without mention of complication, not stated as uncontrolled    Tobacco abuse    Essential hypertension    Diabetic infection of right foot ([place]) 07/22/2016   Diabetic foot infection ([place]) 07/22/2016   Chronic ulcer of heel, right, with fat layer exposed ([place]) 05/20/2016   DKA, type 1 ([place]) 04/18/2016   Acute kidney injury ([place]) 04/18/2016   Hyponatremia 04/18/2016   Severe protein-calorie malnutrition ([place]) 04/18/2016   Elevated troponin 04/18/2016   Poor dentition 07/12/2015   Tobacco use 07/06/2013   PAD (peripheral artery disease) ([place]) 04/29/2013   COPD, mild ([place])    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 ([place]) 11/18/2011   Diabetic neuropathy ([place]) 06/21/2010   PROLIFERATIVE DIABETIC RETINOPATHY 06/21/2010   SKIN TAG 01/30/2010   Chronic hepatitis C ([place]) 11/22/2008  VITILIGO 11/22/2008   PROTEINURIA, MILD 11/22/2008   Substance abuse ([place]) 04/23/2007   DEPRESSION 11/11/2006    Past Surgical History:  Procedure Laterality Date   ABDOMINAL AORTOGRAM W/LOWER EXTREMITY N/A 07/25/2016   Procedure: Abdominal Aortogram w/Bilateral Lower Extremity Runoff;  Surgeon: Conrad York, MD;  Location: [facility];  Service: Cardiovascular;  Laterality: N/A;   ABDOMINAL AORTOGRAM W/LOWER EXTREMITY N/A 12/24/2016   Procedure: ABDOMINAL AORTOGRAM W/LOWER EXTREMITY;  Surgeon: Serafina Mitchell, MD;  Location: [facility];   Service: Cardiovascular;  Laterality: N/A;   AMPUTATION TOE Right 07/26/2016   Procedure: AMPUTATION GREAT TOE;  Surgeon: Serafina Mitchell, MD;  Location: [facility];  Service: Vascular;  Laterality: Right;   EYE SURGERY     retinal surgery x 2, right eye   INSERTION OF ILIAC STENT Right 07/26/2016   Procedure: INSERTION OF RIGHT POPLITEAL STENT WITH BALLOON ANGIOPLASTY;  Surgeon: Serafina Mitchell, MD;  Location: [facility];  Service: Vascular;  Laterality: Right;   LOWER EXTREMITY ANGIOGRAM Right 07/26/2016   Procedure: LOWER EXTREMITY ANGIOGRAM;  Surgeon: Serafina Mitchell, MD;  Location: [facility];  Service: Vascular;  Laterality: Right;   PERIPHERAL VASCULAR BALLOON ANGIOPLASTY Right 07/26/2016   Procedure: PERIPHERAL VASCULAR BALLOON ANGIOPLASTY  RIGHT ANTERIOR TIBEAL ARTERY AND RIGHT SUPERFICIAL FEMORAL ARTERY;  Surgeon: Serafina Mitchell, MD;  Location: [facility];  Service: Vascular;  Laterality: Right;   PERIPHERAL VASCULAR INTERVENTION Right 12/24/2016   Procedure: PERIPHERAL VASCULAR INTERVENTION;  Surgeon: Serafina Mitchell, MD;  Location: [facility];  Service: Cardiovascular;  Laterality: Right;  lower extr        Home Medications    Prior to Admission medications   Medication Sig Start Date End Date Taking? Authorizing Provider  amLODipine (NORVASC) 10 MG tablet Take 1 tablet (10 mg total) by mouth daily. 02/27/18 03/29/18  Medina-Vargas, Monina C, NP  aspirin 81 MG chewable tablet Chew 1 tablet (81 mg total) by mouth daily. 02/12/18   Hosie Poisson, MD  benzonatate (TESSALON) 200 MG capsule Take 1 capsule (200 mg total) by mouth 3 (three) times daily as needed for cough. 02/27/18   Medina-Vargas, Monina C, NP  blood glucose meter kit and supplies KIT Dispense based on patient and insurance preference. Use up to four times daily as directed. (FOR ICD-9 250.00, 250.01). 02/27/18   Medina-Vargas, Monina C, NP  gabapentin (NEURONTIN) 300 MG capsule Take 1 capsule (300 mg total) by mouth every 6  (six) hours. 02/27/18   Medina-Vargas, Monina C, NP  hydrALAZINE (APRESOLINE) 25 MG tablet Take 1 tablet (25 mg total) by mouth every 8 (eight) hours. 02/27/18   Medina-Vargas, Monina C, NP  hydrocortisone (ANUSOL-HC) 2.5 % rectal cream Apply rectally 2 times daily 02/12/18 02/12/19  Hosie Poisson, MD  insulin aspart (NOVOLOG) 100 UNIT/ML injection CBG 70 - 120: 0 units CBG 121 - 150: 3 units CBG 151 - 200: 4 units CBG 201 - 250: 7 units CBG 251 - 300: 11 units CBG 301 - 350: 15 units CBG 351 - 400: 20 units 02/11/18   Hosie Poisson, MD  insulin aspart (NOVOLOG) 100 UNIT/ML injection Inject 12-20 Units into the skin 2 (two) times daily. 100-200=12 units, 201-300=15 units, >300=20 units.    [provider]  insulin glargine (LANTUS) 100 UNIT/ML injection Inject 0.25 mLs (25 Units total) into the skin daily. 02/12/18   Hosie Poisson, MD  Isopropyl Alcohol Wipes 70 % MISC Apply 1 each topically daily as needed.  02/27/18   Medina-Vargas, Monina C, NP  Lancets MISC 1 each by Does not apply route daily as needed. 02/27/18   Medina-Vargas, Monina C, NP  magnesium hydroxide (MILK OF MAGNESIA) 400 MG/5ML suspension Take 30 mLs by mouth daily as needed for mild constipation.    [provider]  metoCLOPramide (REGLAN) 5 MG tablet Take 1 tablet (5 mg total) by mouth 3 (three) times daily. 02/27/18 03/29/18  Medina-Vargas, Monina C, NP  pantoprazole (PROTONIX) 40 MG tablet Take 1 tablet (40 mg total) by mouth daily at 6 (six) AM. 02/27/18   Medina-Vargas, Monina C, NP  Sodium Phosphates (RA SALINE ENEMA) 19-7 GM/118ML ENEM Place 118 mLs rectally daily as needed (constipation).    [provider]    Family History Family History  Problem Relation Age of Onset   Diabetes Father    Kidney disease Father    Arthritis Mother    Heart disease Mother        CAD    Social History Social History   Tobacco Use   Smoking status: Current Every Day Smoker    Packs/day: 0.50      Types: Cigarettes   Smokeless tobacco: Never Used  Substance Use Topics   Alcohol use: No    Alcohol/week: 0.0 standard drinks    Comment: 08/14/14 none   Drug use: Yes    Types: "Crack" cocaine, Marijuana, Heroin    Comment: s/p rehab x 6, occasional drug use (per pt last  use was 06/2015)     Allergies   Codeine   Review of Systems Review of Systems  Unable to perform ROS: Other (hypoglycemia)  All other systems reviewed and are negative.    Physical Exam Updated Vital Signs BP (!) 164/66    Pulse 79    Temp 98 F (36.7 C) (Oral)    Resp 12    SpO2 97%   Physical Exam   ED Treatments / Results  Labs (all labs ordered are listed, but only abnormal results are displayed) Labs Reviewed  BASIC METABOLIC PANEL - Abnormal; Notable for the following components:      Result Value   Potassium 5.8 (*)    Glucose, Bld 156 (*)    BUN 31 (*)    Creatinine, Ser 1.44 (*)    GFR calc non Af Amer 52 (*)    All other components within normal limits  CBC WITH DIFFERENTIAL/PLATELET - Abnormal; Notable for the following components:   WBC 14.6 (*)    RBC 3.95 (*)    Hemoglobin 11.7 (*)    HCT 35.2 (*)    Platelets 408 (*)    Neutro Abs 12.3 (*)    All other components within normal limits  MAGNESIUM - Abnormal; Notable for the following components:   Magnesium 2.5 (*)    All other components within normal limits  CBG MONITORING, ED - Abnormal; Notable for the following components:   Glucose-Capillary 44 (*)    All other components within normal limits  CBG MONITORING, ED - Abnormal; Notable for the following components:   Glucose-Capillary 113 (*)    All other components within normal limits  ETHANOL  URINALYSIS, ROUTINE W REFLEX MICROSCOPIC  RAPID URINE DRUG SCREEN, HOSP PERFORMED  CBG MONITORING, ED    EKG EKG Interpretation  Date/Time:  Tuesday August 11 2018 20:25:33 EDT Ventricular Rate:  78 PR Interval:    QRS Duration: 95 QT Interval:  401 QTC  Calculation: 457 R Axis:   18  Text Interpretation:  Sinus rhythm Probable left atrial enlargement Anteroseptal infarct, age indeterminate No significant change since last tracing Confirmed by Lacretia Leigh [phone]) on 08/11/2018 10:02:07 PM   Radiology No results found.  Procedures Procedures (including critical care time)  Medications Ordered in ED Medications  sodium chloride flush (NS) 0.9 % injection 3 mL (has no administration in time range)  sodium chloride flush (NS) 0.9 % injection 3 mL (3 mLs Intravenous Given 08/11/18 2017)  0.9 %  sodium chloride infusion (has no administration in time range)  dextrose 50 % solution 50 mL (50 mLs Intravenous Given 08/11/18 2015)     Initial Impression / Assessment and Plan / ED Course  I have reviewed the triage vital signs and the nursing notes.  Pertinent labs & imaging results that were available during my care of the patient were reviewed by me and considered in my medical decision making (see chart for details).  Clinical Course as of Aug 11 2206  Tue Aug 11, 2018  2026 Patient reevaluated after D50 was given.  He is now much more alert.  Conversational.  He is fully oriented.  States since getting out of the SNF him and his son have been sleeping in hotels, staying with friends.  He takes 40 units of "long-acting insulin" but cannot tell me the name every morning and last time he took it was today.  He does not remember if he ate anything afterwards but remembers waking up and having EMS standing around him.  Frequently asking for food stating he is malnourished.  States since getting out of the SNF he has not been able to refill his medicines for neuropathy.  He denies any head trauma, falls, headache, vision changes, neck pain, chest pain, shortness of breath, abdominal pain, vomiting or diarrhea.  He smoked a joint of marijuana earlier today but denies any other drugs.  No EtOH.   [CG]  2121 Glucose(!): 156 [CG]  2121 Creatinine(!):  1.44 [CG]  2121 Potassium(!): 5.8 [CG]    Clinical Course User Index [CG] Kinnie Feil, PA-C       AMS likely from hypoglycemia.  Long acting insulin and minimal food intake.  We will check labs, give D50, reassess.   2203: Significant improvement in mentation after D50 and food in ED.  Patient was more alert after D50 and history is mostly benign.  No falls, head trauma, CP, SOB, abdominal pain, vomiting, diarrhea. Neuro exam normal. Pt has frequent hospital visits for either hypo/hyperglycemia.  Difficult social situation and low literacy. Sounds like he is homeless.  I have spoken to SW/CM and they will contact patient to provide assistance with finding PCP, obtaining meds, glucometer. Will re-check CBG.  Final Clinical Impressions(s) / ED Diagnoses   Patient will be handed off to oncoming EDPA at shift change who will f/u on CBG and determine disposition. Anticipate discharge. Consider admission for persistent hypoglycemia.  Final diagnoses:  Hypoglycemia  Medical non-compliance  Marijuana use    ED Discharge Orders    None       Arlean Hopping 08/11/18 2208    Lacretia Leigh, MD 08/12/18 2252

## 2018-08-11 NOTE — ED Notes (Signed)
Talked to social work. Pt son said he will pick up pt. Social work said he will leave a Chief Strategy Officer with Jackelyn Poling in the event his son is unavailable.

## 2018-08-11 NOTE — Progress Notes (Addendum)
CSW received a call from [facility] stating pt states pt needs a PCP follow up and assistance with needed insulin, gabapentin and glucometer to assist pt in not returning to ED for med needs and to assist with a safe discharge plan.  Per EDP, it is likely that pt/pt's son are homeless and are residing with friends or at hotels, etc.  CSW spoke to [facility] RN CM who states the [facility] program cannot be used if pt has insurance.  CSW will continue to follow for D/C needs.  Alphonse Guild. Kairi Harshbarger, LCSW, LCAS, CSI Transitions of Care Clinical Social Worker Care Coordination Department Ph: [phone]

## 2018-08-11 NOTE — ED Triage Notes (Addendum)
Pt transported to [facility] ED via [facility] from home. [facility] reports the following:  -4 PM EMS responded to home BG 31. Refused transport. -7 PM EMS responded to 2nd call for hypoglycemia. Glucose 56. Sweaty, lethargic, unable to follow commands. EMS gave 12.5g D10. BG 222. Has been taking insulin, unable to identify rx. Took insuline today. -BP 148/63 was 200/100 O2 99% RA HR 80 NSR on monitor -Needs assistance managing DM2 at home. Doesn't have glucometer. Was at [facility] rehab next to [facility] -Hx neuropathy -Needs social work consult

## 2018-08-11 NOTE — ED Notes (Addendum)
Pt BG 37. Gave pt 8 oz orange juice and 0.75 peanut butter.

## 2018-08-11 NOTE — ED Notes (Signed)
BG 44

## 2018-08-11 NOTE — ED Notes (Addendum)
Pt drank 8oz ginger ale 17g sugar.

## 2018-08-11 NOTE — Progress Notes (Signed)
CSW updated EDP who stated pt would be ready to leave in approx an hour.    CSW updated pt's son who stated he would be standing by to p/u the pt when ready at ph: [phone] and is willing to pay for pt's needles and insulin tomorrow at [facility].  Pt's son state the reason pt's blood sugar is so low is because the pt refused to eat at son's request and pt's son is hoping staff can convince him to eat prior to D/C.  RN updated.  CSW will continue to follow for D/C needs.  Alphonse Guild. Fadel Clason, LCSW, LCAS, CSI Transitions of Care Clinical Social Worker Care Coordination Department Ph: [phone]

## 2018-08-11 NOTE — ED Notes (Signed)
Unable to complete triage questions due to patients GCS of 13

## 2018-08-11 NOTE — ED Notes (Signed)
Pt called out for a nurse, pt states he has been here 6 hours and needs to pee, this nurse gave pt a urinal and reoriented him that he got here 2 hours ago and we have been watching his blood sugar. Pt stated he needs privacy to pee and is ready to leave.

## 2018-08-12 ENCOUNTER — Other Ambulatory Visit: Payer: Self-pay

## 2018-08-12 ENCOUNTER — Encounter (HOSPITAL_COMMUNITY): Payer: Self-pay

## 2018-08-12 ENCOUNTER — Inpatient Hospital Stay (HOSPITAL_COMMUNITY): Payer: Medicare HMO

## 2018-08-12 DIAGNOSIS — E162 Hypoglycemia, unspecified: Secondary | ICD-10-CM | POA: Diagnosis present

## 2018-08-12 DIAGNOSIS — R1310 Dysphagia, unspecified: Secondary | ICD-10-CM | POA: Diagnosis present

## 2018-08-12 DIAGNOSIS — E785 Hyperlipidemia, unspecified: Secondary | ICD-10-CM | POA: Diagnosis present

## 2018-08-12 DIAGNOSIS — Z91128 Patient's intentional underdosing of medication regimen for other reason: Secondary | ICD-10-CM | POA: Diagnosis not present

## 2018-08-12 DIAGNOSIS — T383X1A Poisoning by insulin and oral hypoglycemic [antidiabetic] drugs, accidental (unintentional), initial encounter: Secondary | ICD-10-CM | POA: Diagnosis present

## 2018-08-12 DIAGNOSIS — K0889 Other specified disorders of teeth and supporting structures: Secondary | ICD-10-CM | POA: Diagnosis present

## 2018-08-12 DIAGNOSIS — F17210 Nicotine dependence, cigarettes, uncomplicated: Secondary | ICD-10-CM | POA: Diagnosis present

## 2018-08-12 DIAGNOSIS — E10649 Type 1 diabetes mellitus with hypoglycemia without coma: Secondary | ICD-10-CM | POA: Diagnosis present

## 2018-08-12 DIAGNOSIS — J449 Chronic obstructive pulmonary disease, unspecified: Secondary | ICD-10-CM | POA: Diagnosis present

## 2018-08-12 DIAGNOSIS — F1210 Cannabis abuse, uncomplicated: Secondary | ICD-10-CM | POA: Diagnosis present

## 2018-08-12 DIAGNOSIS — Z720 Tobacco use: Secondary | ICD-10-CM | POA: Diagnosis not present

## 2018-08-12 DIAGNOSIS — E11649 Type 2 diabetes mellitus with hypoglycemia without coma: Secondary | ICD-10-CM | POA: Diagnosis present

## 2018-08-12 DIAGNOSIS — E1051 Type 1 diabetes mellitus with diabetic peripheral angiopathy without gangrene: Secondary | ICD-10-CM | POA: Diagnosis present

## 2018-08-12 DIAGNOSIS — D649 Anemia, unspecified: Secondary | ICD-10-CM | POA: Diagnosis present

## 2018-08-12 DIAGNOSIS — F1410 Cocaine abuse, uncomplicated: Secondary | ICD-10-CM | POA: Diagnosis present

## 2018-08-12 DIAGNOSIS — E1040 Type 1 diabetes mellitus with diabetic neuropathy, unspecified: Secondary | ICD-10-CM | POA: Diagnosis present

## 2018-08-12 DIAGNOSIS — E1022 Type 1 diabetes mellitus with diabetic chronic kidney disease: Secondary | ICD-10-CM | POA: Diagnosis present

## 2018-08-12 DIAGNOSIS — Z95820 Peripheral vascular angioplasty status with implants and grafts: Secondary | ICD-10-CM | POA: Diagnosis not present

## 2018-08-12 DIAGNOSIS — N179 Acute kidney failure, unspecified: Secondary | ICD-10-CM | POA: Diagnosis present

## 2018-08-12 DIAGNOSIS — Z89411 Acquired absence of right great toe: Secondary | ICD-10-CM | POA: Diagnosis not present

## 2018-08-12 DIAGNOSIS — B182 Chronic viral hepatitis C: Secondary | ICD-10-CM | POA: Diagnosis present

## 2018-08-12 DIAGNOSIS — H409 Unspecified glaucoma: Secondary | ICD-10-CM | POA: Diagnosis present

## 2018-08-12 DIAGNOSIS — N183 Chronic kidney disease, stage 3 (moderate): Secondary | ICD-10-CM | POA: Diagnosis present

## 2018-08-12 DIAGNOSIS — E103599 Type 1 diabetes mellitus with proliferative diabetic retinopathy without macular edema, unspecified eye: Secondary | ICD-10-CM | POA: Diagnosis present

## 2018-08-12 DIAGNOSIS — Z9114 Patient's other noncompliance with medication regimen: Secondary | ICD-10-CM | POA: Diagnosis not present

## 2018-08-12 DIAGNOSIS — I129 Hypertensive chronic kidney disease with stage 1 through stage 4 chronic kidney disease, or unspecified chronic kidney disease: Secondary | ICD-10-CM | POA: Diagnosis present

## 2018-08-12 DIAGNOSIS — T383X6A Underdosing of insulin and oral hypoglycemic [antidiabetic] drugs, initial encounter: Secondary | ICD-10-CM | POA: Diagnosis present

## 2018-08-12 LAB — CBG MONITORING, ED
Glucose-Capillary: 113 mg/dL — ABNORMAL HIGH (ref 70–99)
Glucose-Capillary: 144 mg/dL — ABNORMAL HIGH (ref 70–99)
Glucose-Capillary: 23 mg/dL — CL (ref 70–99)
Glucose-Capillary: 31 mg/dL — CL (ref 70–99)

## 2018-08-12 LAB — COMPREHENSIVE METABOLIC PANEL
ALT: 18 U/L (ref 0–44)
AST: 21 U/L (ref 15–41)
Albumin: 3 g/dL — ABNORMAL LOW (ref 3.5–5.0)
Alkaline Phosphatase: 105 U/L (ref 38–126)
Anion gap: 8 (ref 5–15)
BUN: 31 mg/dL — ABNORMAL HIGH (ref 8–23)
CO2: 27 mmol/L (ref 22–32)
Calcium: 8.9 mg/dL (ref 8.9–10.3)
Chloride: 102 mmol/L (ref 98–111)
Creatinine, Ser: 1.4 mg/dL — ABNORMAL HIGH (ref 0.61–1.24)
GFR calc Af Amer: >60 mL/min (ref >60)
GFR calc non Af Amer: 54 mL/min — ABNORMAL LOW (ref >60)
Glucose, Bld: 65 mg/dL — ABNORMAL LOW (ref 70–99)
Potassium: 4.7 mmol/L (ref 3.5–5.1)
Sodium: 137 mmol/L (ref 135–145)
Total Bilirubin: 0.4 mg/dL (ref 0.3–1.2)
Total Protein: 6.4 g/dL — ABNORMAL LOW (ref 6.5–8.1)

## 2018-08-12 LAB — CBC
HCT: 36.1 % — ABNORMAL LOW (ref 39.0–52.0)
Hemoglobin: 11.7 g/dL — ABNORMAL LOW (ref 13.0–17.0)
MCH: 29.3 pg (ref 26.0–34.0)
MCHC: 32.4 g/dL (ref 30.0–36.0)
MCV: 90.3 fL (ref 80.0–100.0)
Platelets: 466 10*3/uL — ABNORMAL HIGH (ref 150–400)
RBC: 4 MIL/uL — ABNORMAL LOW (ref 4.22–5.81)
RDW: 14.3 % (ref 11.5–15.5)
WBC: 14.1 10*3/uL — ABNORMAL HIGH (ref 4.0–10.5)
nRBC: 0 % (ref 0.0–0.2)

## 2018-08-12 LAB — GLUCOSE, CAPILLARY
Glucose-Capillary: 103 mg/dL — ABNORMAL HIGH (ref 70–99)
Glucose-Capillary: 124 mg/dL — ABNORMAL HIGH (ref 70–99)
Glucose-Capillary: 137 mg/dL — ABNORMAL HIGH (ref 70–99)
Glucose-Capillary: 170 mg/dL — ABNORMAL HIGH (ref 70–99)
Glucose-Capillary: 195 mg/dL — ABNORMAL HIGH (ref 70–99)
Glucose-Capillary: 252 mg/dL — ABNORMAL HIGH (ref 70–99)
Glucose-Capillary: 271 mg/dL — ABNORMAL HIGH (ref 70–99)
Glucose-Capillary: 285 mg/dL — ABNORMAL HIGH (ref 70–99)
Glucose-Capillary: 286 mg/dL — ABNORMAL HIGH (ref 70–99)
Glucose-Capillary: 290 mg/dL — ABNORMAL HIGH (ref 70–99)
Glucose-Capillary: 394 mg/dL — ABNORMAL HIGH (ref 70–99)
Glucose-Capillary: 41 mg/dL — CL (ref 70–99)
Glucose-Capillary: 54 mg/dL — ABNORMAL LOW (ref 70–99)
Glucose-Capillary: 57 mg/dL — ABNORMAL LOW (ref 70–99)
Glucose-Capillary: 87 mg/dL (ref 70–99)
Glucose-Capillary: 99 mg/dL (ref 70–99)

## 2018-08-12 LAB — POTASSIUM: Potassium: 4.4 mmol/L (ref 3.5–5.1)

## 2018-08-12 MED ORDER — DEXTROSE 10 % IV SOLN
INTRAVENOUS | Status: DC
  Administered 2018-08-12 (×2): via INTRAVENOUS
  Filled 2018-08-12: qty 1000

## 2018-08-12 MED ORDER — INSULIN ASPART 100 UNIT/ML ~~LOC~~ SOLN
0.0000 [IU] | Freq: Every day | SUBCUTANEOUS | Status: DC
  Administered 2018-08-12: 22:00:00 3 [IU] via SUBCUTANEOUS

## 2018-08-12 MED ORDER — ONDANSETRON HCL 4 MG/2ML IJ SOLN: 4.0000 mg | Freq: Four times a day (QID) | INTRAMUSCULAR | Status: DC | PRN

## 2018-08-12 MED ORDER — ENOXAPARIN SODIUM 40 MG/0.4ML ~~LOC~~ SOLN
40.0000 mg | Freq: Every day | SUBCUTANEOUS | Status: DC
  Administered 2018-08-12 – 2018-08-15 (×4): 40 mg via SUBCUTANEOUS
  Filled 2018-08-12 (×4): qty 0.4

## 2018-08-12 MED ORDER — NICOTINE 21 MG/24HR TD PT24
21.0000 mg | MEDICATED_PATCH | Freq: Every day | TRANSDERMAL | Status: DC
  Administered 2018-08-12 – 2018-08-14 (×3): 21 mg via TRANSDERMAL
  Filled 2018-08-12 (×4): qty 1

## 2018-08-12 MED ORDER — ONDANSETRON HCL 4 MG PO TABS: 4.0000 mg | ORAL_TABLET | Freq: Four times a day (QID) | ORAL | Status: DC | PRN

## 2018-08-12 MED ORDER — INSULIN ASPART 100 UNIT/ML ~~LOC~~ SOLN
0.0000 [IU] | Freq: Three times a day (TID) | SUBCUTANEOUS | Status: DC
  Administered 2018-08-12: 2 [IU] via SUBCUTANEOUS
  Administered 2018-08-13: 9 [IU] via SUBCUTANEOUS
  Administered 2018-08-13: 7 [IU] via SUBCUTANEOUS
  Administered 2018-08-14: 9 [IU] via SUBCUTANEOUS

## 2018-08-12 MED ORDER — INSULIN ASPART PROT & ASPART (70-30 MIX) 100 UNIT/ML ~~LOC~~ SUSP
10.0000 [IU] | Freq: Two times a day (BID) | SUBCUTANEOUS | Status: DC
  Administered 2018-08-12 – 2018-08-14 (×4): 10 [IU] via SUBCUTANEOUS
  Filled 2018-08-12: qty 10

## 2018-08-12 MED ORDER — DEXTROSE 50 % IV SOLN
INTRAVENOUS | Status: AC
  Filled 2018-08-12: qty 50

## 2018-08-12 MED ORDER — ENSURE ENLIVE PO LIQD
237.0000 mL | Freq: Two times a day (BID) | ORAL | Status: DC
  Administered 2018-08-12 – 2018-08-14 (×4): 237 mL via ORAL

## 2018-08-12 MED ORDER — DEXTROSE 50 % IV SOLN
1.0000 | Freq: Once | INTRAVENOUS | Status: AC
  Administered 2018-08-12: 01:00:00 50 mL via INTRAVENOUS

## 2018-08-12 MED ORDER — DEXTROSE 5 % IV SOLN: INTRAVENOUS | Status: DC

## 2018-08-12 NOTE — Progress Notes (Signed)
PROGRESS NOTE    [name]  TZG:[id number] DOB: [birth year] DOA: 08/11/2018 PCP: Patient, No Pcp Per   Brief Narrative:  HPI On 08/12/2018 by Dr. Gala Romney [name] is a 63 y.o. male with medical history significant of type I diabetes with poor control, hypertension, chronic hepatitis C, vitiligo, polysubstance abuse, depression, medication noncompliance, hyperlipidemia who presented to the ER brought in by EMS secondary to blood sugar of 31.  Patient refused EMS transport initially.  They were called at 4:00 and then at 7:00 when the sugar was too low.  He arrived the ER sweaty lethargic and not following commands.  Patient initially received dextrose bolus and D10 glucose.  Blood sugar jumped to 222 on arrival.  In the ER however his blood sugar has continued to drop and not being maintained above 40-50.  Patient reported apparently taking insulin but not sure of the dose.  With his noncompliance not sure of what he took and when he took it.  He is awake now however blood sugar is still borderline below 60.  It responds to IV dextrose.  Patient is being admitted to the hospital for prolonged hypoglycemia.  He has history of polysubstance abuse including marijuana use.  Denied taking any IV drugs tonight.  He has no fever no chills.  No nausea vomiting or diarrhea..  Assessment & Plan   Admitted earlier today by Dr. Jonelle Sidle. See H&P for details.  Persistent hypoglycemia/uncontrolled diabetes mellitus -?  Unknown cause, possibly inadvertent insulin overdose -Continue to monitor CBGs every 1-2 hours -Continue D10 infusion -last hemoglobin A1c 10.1 on 01/30/2018 -Diabetes coordinator consulted, recommending Novolog 70/30 10u BID with sensitive ISS  Chronic kidney disease, stage III -Creatinine stable  Tobacco abuse -Smoking cessation counseling provided, continue nicotine patch  Essential hypertension   Chronic hepatitis C -Followed by infectious disease  COPD -Currently stable,  not in acute exacerbation  Leukocytosis -Likely reactive, continue to monitor CBC  DVT Prophylaxis Lovenox  Code Status: Full  Family Communication: None at bedside  Disposition Plan: Admitted.  Pending improvement in blood sugars and stabilization.  Suspect home on discharge  Consultants None  Procedures  None  Antibiotics   Anti-infectives (From admission, onward)   None      Subjective:   [name] seen and examined today.  Complains of rib pain, foot pain, finger pain.  States he fell.  Denies current chest pain, shortness of breath, abdominal pain, nausea or vomiting, diarrhea constipation. Objective:   Vitals:   08/12/18 0400 08/12/18 0634 08/12/18 1205 08/12/18 1315  BP:  (!) 116/43  (!) 149/78  Pulse:  79  81  Resp:  20  19  Temp:  99 F (37.2 C)  98.6 F (37 C)  TempSrc:  Oral  Oral  SpO2:  95% 94% 99%  Weight: 64.7 kg     Height: 6' (1.829 m)       Intake/Output Summary (Last 24 hours) at 08/12/2018 1315 Last data filed at 08/12/2018 1000 Gross per 24 hour  Intake 1177.6 ml  Output 150 ml  Net 1027.6 ml   Filed Weights   08/12/18 0400  Weight: 64.7 kg    Exam No exam- patient admitted earlier today.   Data Reviewed: I have personally reviewed following labs and imaging studies  CBC: Recent Labs  Lab 08/11/18 2034 08/12/18 0428  WBC 14.6* 14.1*  NEUTROABS 12.3*  --   HGB 11.7* 11.7*  HCT 35.2* 36.1*  MCV 89.1 90.3  PLT  408* 161*   Basic Metabolic Panel: Recent Labs  Lab 08/11/18 2034 08/12/18 0115 08/12/18 0428  NA 135  --  137  K 5.8* 4.4 4.7  CL 104  --  102  CO2 25  --  27  GLUCOSE 156*  --  65*  BUN 31*  --  31*  CREATININE 1.44*  --  1.40*  CALCIUM 8.9  --  8.9  MG 2.5*  --   --    GFR: Estimated Creatinine Clearance: 50.7 mL/min (A) (by C-G formula based on SCr of 1.4 mg/dL (H)). Liver Function Tests: Recent Labs  Lab 08/12/18 0428  AST 21  ALT 18  ALKPHOS 105  BILITOT 0.4  PROT 6.4*  ALBUMIN 3.0*   No  results for input(s): LIPASE, AMYLASE in the last 168 hours. No results for input(s): AMMONIA in the last 168 hours. Coagulation Profile: No results for input(s): INR, PROTIME in the last 168 hours. Cardiac Enzymes: No results for input(s): CKTOTAL, CKMB, CKMBINDEX, TROPONINI in the last 168 hours. BNP (last 3 results) No results for input(s): PROBNP in the last 8760 hours. HbA1C: No results for input(s): HGBA1C in the last 72 hours. CBG: Recent Labs  Lab 08/12/18 0611 08/12/18 0712 08/12/18 0857 08/12/18 0934 08/12/18 1140  GLUCAP 124* 103* 271* 170* 285*   Lipid Profile: No results for input(s): CHOL, HDL, LDLCALC, TRIG, CHOLHDL, LDLDIRECT in the last 72 hours. Thyroid Function Tests: No results for input(s): TSH, T4TOTAL, FREET4, T3FREE, THYROIDAB in the last 72 hours. Anemia Panel: No results for input(s): VITAMINB12, FOLATE, FERRITIN, TIBC, IRON, RETICCTPCT in the last 72 hours. Urine analysis:    Component Value Date/Time   COLORURINE YELLOW 08/11/2018 2034   APPEARANCEUR CLEAR 08/11/2018 2034   LABSPEC 1.013 08/11/2018 2034   PHURINE 5.0 08/11/2018 2034   GLUCOSEU >=500 (A) 08/11/2018 2034   GLUCOSEU 500 04/28/2012 1149   HGBUR NEGATIVE 08/11/2018 2034   BILIRUBINUR NEGATIVE 08/11/2018 2034   BILIRUBINUR neg 04/28/2012 [place] 08/11/2018 2034   PROTEINUR 100 (A) 08/11/2018 2034   UROBILINOGEN 4.0 (H) 11/16/2013 1646   NITRITE NEGATIVE 08/11/2018 2034   LEUKOCYTESUR NEGATIVE 08/11/2018 2034   Sepsis Labs: @LABRCNTIP (procalcitonin:4,lacticidven:4)  )No results found for this or any previous visit (from the past 240 hour(s)).    Radiology Studies: Dg Chest 2 View  Result Date: 08/12/2018 CLINICAL DATA:  Left rib pain after fall. EXAM: CHEST - 2 VIEW COMPARISON:  Radiograph of June 15, 2018. FINDINGS: The heart size and mediastinal contours are within normal limits. Both lungs are clear. No pneumothorax or pleural effusion is noted. Old left  rib fractures are noted. IMPRESSION: No active cardiopulmonary disease. Electronically Signed   By: Marijo Conception M.D.   On: 08/12/2018 09:27   Dg Foot Complete Right  Result Date: 08/12/2018 CLINICAL DATA:  Toe pain after fall. EXAM: RIGHT FOOT COMPLETE - 3+ VIEW COMPARISON:  Radiographs of April 19, 2017 and July 27, 2016. FINDINGS: Status post amputation of first toe. Vascular calcifications are noted. There is noted posterior dislocation of the second middle phalanx relative to the second proximal phalanx. No definite fracture is noted. Linear metallic density is seen in the plantar soft tissues posteriorly underneath the calcaneus consistent with foreign body which was present on prior exam. IMPRESSION: Posterior dislocation of second middle phalanx relative to the second proximal phalanx. No definite fracture is noted. Linear metallic foreign body is seen in plantar soft tissues underneath the calcaneus which was present  on prior exam. Electronically Signed   By: Marijo Conception M.D.   On: 08/12/2018 09:30     Scheduled Meds: . enoxaparin (LOVENOX) injection  40 mg Subcutaneous Daily  . feeding supplement (ENSURE ENLIVE)  237 mL Oral BID BM  . nicotine  21 mg Transdermal Daily  . sodium chloride flush  3 mL Intravenous Q12H   Continuous Infusions: . sodium chloride    . dextrose 150 mL/hr at 08/12/18 0930     LOS: 0 days   Time Spent in minutes   30 minutes  Obaloluwa Delatte D.O. on 08/12/2018 at 1:15 PM  Between 7am to 7pm - Please see pager noted on [email or web address]  After 7pm go to [email or web address]  And look for the night coverage person covering for me after hours  [facility] Office  [phone]
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
## 2018-08-12 NOTE — TOC Initial Note (Signed)
Transition of Care [facility]) - Initial/Assessment Note    Patient Details  Name: [name] MRN: [id number] Date of Birth: Nov 21, 1955  Transition of Care [facility]) CM/SW Contact:    Wende Neighbors, LCSW Phone Number: 08/12/2018, 3:06 PM  Clinical Narrative:  CSW received consult for homeless needs. Patient stated he lives with his adult son, son's cousin and cousins husband and 61 young children. Patient stated he can go back to there home after discharge. Patient stated he receives over 1200 dollars every month. CSW encouraged patient to find affordable housing after discharge so patient can have his own place. Patient stated that he would really like his own place. Patient stated his family will pick him up upon discharge                Expected Discharge Plan: Home/Self Care Barriers to Discharge: Continued Medical Work up   Patient Goals and CMS Choice Patient states their goals for this hospitalization and ongoing recovery are:: to find his own place      Expected Discharge Plan and Services Expected Discharge Plan: Home/Self Care In-house Referral: Clinical Social Work     Living arrangements for the past 2 months: Single Family Home Expected Discharge Date: 08/14/18                                    Prior Living Arrangements/Services Living arrangements for the past 2 months: Single Family Home Lives with:: Self, Adult Children, Minor Children, Other (Comment) Patient language and need for interpreter reviewed:: Yes        Need for Family Participation in Patient Care: Yes (Comment) Care giver support system in place?: Yes (comment)   Criminal Activity/Legal Involvement Pertinent to Current Situation/Hospitalization: No - Comment as needed  Activities of Daily Living Home Assistive Devices/Equipment: None ADL Screening (condition at time of admission) Patient's cognitive ability adequate to safely complete daily activities?: Yes Is the patient deaf or have  difficulty hearing?: No Does the patient have difficulty seeing, even when wearing glasses/contacts?: No Does the patient have difficulty concentrating, remembering, or making decisions?: No Patient able to express need for assistance with ADLs?: Yes Does the patient have difficulty dressing or bathing?: No Independently performs ADLs?: Yes (appropriate for developmental age) Does the patient have difficulty walking or climbing stairs?: No Weakness of Legs: None Weakness of Arms/Hands: None  Permission Sought/Granted Permission sought to share information with : Family Supports Permission granted to share information with : Yes, Verbal Permission Granted  Share Information with NAME: Latham Kinzler     Permission granted to share info w Relationship: son  Permission granted to share info w Contact Information: [phone]  Emotional Assessment Appearance:: Appears stated age Attitude/Demeanor/Rapport: Engaged Affect (typically observed): Accepting Orientation: : Oriented to Self, Oriented to Place, Oriented to  Time, Oriented to Situation Alcohol / Substance Use: Not Applicable(has history ) Psych Involvement: No (comment)  Admission diagnosis:  Hypoglycemia [E16.2] Medical non-compliance [Z91.19] Marijuana use [F12.90] Patient Active Problem List   Diagnosis Date Noted  . Hypoglycemia associated with diabetes ([place]) 08/12/2018  . At risk for adverse drug event 02/17/2018  . Abrasions of multiple sites   . Demand ischemia of myocardium ([place]) 02/05/2018  . Acute metabolic encephalopathy 00/93/8182  . DKA (diabetic ketoacidoses) ([place]) 02/03/2018  . Leukocytosis 02/03/2018  . Hypoglycemia 12/24/2017  . Syncope 12/24/2017  . Syncope and collapse 12/23/2017  . IV drug abuse (  [place]) 04/19/2017  . Acute encephalopathy 04/19/2017  . Hemangioma 10/02/2016  . Foreign body (FB) in soft tissue   . 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[place])   . Gangrene ([place])   . Type I (juvenile type)  diabetes mellitus without mention of complication, not stated as uncontrolled   . Tobacco abuse   . Essential hypertension   . Diabetic infection of right foot ([place]) 07/22/2016  . Diabetic foot infection ([place]) 07/22/2016  . Chronic ulcer of heel, right, with fat layer exposed ([place]) 05/20/2016  . DKA, type 1 ([place]) 04/18/2016  . Acute kidney injury ([place]) 04/18/2016  . Hyponatremia 04/18/2016  . Severe protein-calorie malnutrition ([place]) 04/18/2016  . Elevated troponin 04/18/2016  . Poor dentition 07/12/2015  . Tobacco use 07/06/2013  . PAD (peripheral artery disease) ([place]) 04/29/2013  . COPD, mild ([place])   . 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 Diabetic neuropathy ([place]) 06/21/2010  . PROLIFERATIVE DIABETIC RETINOPATHY 06/21/2010  . SKIN TAG 01/30/2010  . Chronic hepatitis C ([place]) 11/22/2008  . VITILIGO 11/22/2008  . PROTEINURIA, MILD 11/22/2008  . Substance abuse ([place]) 04/23/2007  . DEPRESSION 11/11/2006   PCP:  Patient, No Pcp Per Pharmacy:   [facility] [address] [place] - [place] [address] [place] [place] [address] Phone: [phone] Fax: [phone]     Social Determinants of Health (SDOH) Interventions    Readmission Risk Interventions Readmission Risk Prevention Plan 08/12/2018  Transportation Screening Complete  Medication Review Press photographer) Complete  PCP or Specialist appointment within 3-5 days of discharge Complete  HRI or [place] Complete  SW Recovery Care/Counseling Consult Complete  [place] Not Applicable  Some recent data might be hidden

## 2018-08-12 NOTE — ED Notes (Signed)
Pt called this nurse to the room stating that he needs to see a professional about his neuropathy and needs to follow up about it, the patient began talking about many different things and not making sense. This nurse attempted to clarify asking if he was hurting. Pt replied "Yes but I am not asking for pain medicine I can get dope on the street." Pt continues to ask for a doctor and this nurse explained they will be in shortly. Pt in no distress.

## 2018-08-12 NOTE — ED Notes (Signed)
ED TO INPATIENT HANDOFF REPORT  ED Nurse Name and Phone #: Earnest Bailey [phone]  S Name/Age/Gender [name] 63 y.o. male Room/Bed: WA19/WA19  Code Status   Code Status: Prior  Home/SNF/Other Home Patient oriented to: self, place and situation Is this baseline? Yes   Triage Complete: Triage complete  Chief Complaint Hypoglycemia  Triage Note Pt transported to [facility] ED via [facility] from home. [facility] reports the following:  -4 PM EMS responded to home BG 31. Refused transport. -7 PM EMS responded to 2nd call for hypoglycemia. Glucose 56. Sweaty, lethargic, unable to follow commands. EMS gave 12.5g D10. BG 222. Has been taking insulin, unable to identify rx. Took insuline today. -BP 148/63 was 200/100 O2 99% RA HR 80 NSR on monitor -Needs assistance managing DM2 at home. Doesn't have glucometer. Was at [facility] rehab next to [facility] -Hx neuropathy -Needs social work consult     Allergies Allergies  Allergen Reactions  . Codeine Itching    Tolerates hydrocodone/apap    Level of Care/Admitting Diagnosis ED Disposition    ED Disposition Condition [facility] Area: [place] [[id number]]  Level of Care: Telemetry [5]  Admit to tele based on following criteria: Other see comments  Comments: hypoglycemia  Covid Evaluation: N/A  Diagnosis: Hypoglycemia associated with diabetes [facility]) [[id number]]  Admitting Physician: Elwyn Reach [[id number]]  Attending Physician: Elwyn Reach [[id number]]  Estimated length of stay: past midnight tomorrow  Certification:: I certify this patient will need inpatient services for at least 2 midnights  PT Class (Do Not Modify): Inpatient [101]  PT Acc Code (Do Not Modify): Private [1]       B Medical/Surgery History Past Medical History:  Diagnosis Date  . DEPRESSION   . DIABETES MELLITUS, TYPE I   . DRUG ABUSE    pt should have NO controlled substances rx'ed  . Glaucoma   . HEPATITIS C    chronic  .  Hypertension 02/18/2011  . 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 Vitiligo    Past Surgical History:  Procedure Laterality Date  . ABDOMINAL AORTOGRAM W/LOWER EXTREMITY N/A 07/25/2016   Procedure: Abdominal Aortogram w/Bilateral Lower Extremity Runoff;  Surgeon: Conrad Ava, MD;  Location: [place] CV LAB;  Service: Cardiovascular;  Laterality: N/A;  . ABDOMINAL AORTOGRAM W/LOWER EXTREMITY N/A 12/24/2016   Procedure: ABDOMINAL AORTOGRAM W/LOWER EXTREMITY;  Surgeon: Serafina Mitchell, MD;  Location: [place] [facility] LAB;  Service: Cardiovascular;  Laterality: N/A;  . AMPUTATION TOE Right 07/26/2016   Procedure: AMPUTATION GREAT TOE;  Surgeon: Serafina Mitchell, MD;  Location: [place];  Service: Vascular;  Laterality: Right;  . EYE SURGERY     retinal surgery x 2, right eye  . INSERTION OF ILIAC STENT Right 07/26/2016   Procedure: INSERTION OF RIGHT POPLITEAL STENT WITH BALLOON ANGIOPLASTY;  Surgeon: Serafina Mitchell, MD;  Location: [place];  Service: Vascular;  Laterality: Right;  . LOWER EXTREMITY ANGIOGRAM Right 07/26/2016   Procedure: LOWER EXTREMITY ANGIOGRAM;  Surgeon: Serafina Mitchell, MD;  Location: [place];  Service: Vascular;  Laterality: Right;  . PERIPHERAL VASCULAR BALLOON ANGIOPLASTY Right 07/26/2016   Procedure: PERIPHERAL VASCULAR BALLOON ANGIOPLASTY  RIGHT ANTERIOR TIBEAL ARTERY AND RIGHT SUPERFICIAL FEMORAL ARTERY;  Surgeon: Serafina Mitchell, MD;  Location: [place];  Service: Vascular;  Laterality: Right;  . PERIPHERAL VASCULAR INTERVENTION Right 12/24/2016   Procedure: PERIPHERAL VASCULAR INTERVENTION;  Surgeon: Serafina Mitchell, MD;  Location: [place] CV  LAB;  Service: Cardiovascular;  Laterality: Right;  lower extr     A IV Location/Drains/Wounds Patient Lines/Drains/Airways Status   Active Line/Drains/Airways    Name:   Placement date:   Placement time:   Site:   Days:   Peripheral IV 08/11/18 Right Forearm   08/11/18    2019    Forearm   1   Incision (Closed) 07/26/16 Groin  Left   07/26/16    1200     747   Incision (Closed) 07/26/16 Foot Right   07/26/16    1200     747   Wound / Incision (Open or Dehisced) 07/22/16 Diabetic ulcer Foot Right;Lateral;Posterior   07/22/16    1700    Foot   751          Intake/Output Last 24 hours No intake or output data in the 24 hours ending 08/12/18 0228  Labs/Imaging Results for orders placed or performed during the hospital encounter of 08/11/18 (from the past 48 hour(s))  CBG monitoring, ED     Status: Abnormal   Collection Time: 08/11/18  8:05 PM  Result Value Ref Range   Glucose-Capillary 44 (LL) 70 - 99 mg/dL   Comment 1 Document in Chart   Basic metabolic panel     Status: Abnormal   Collection Time: 08/11/18  8:34 PM  Result Value Ref Range   Sodium 135 135 - 145 mmol/L   Potassium 5.8 (H) 3.5 - 5.1 mmol/L   Chloride 104 98 - 111 mmol/L   CO2 25 22 - 32 mmol/L   Glucose, Bld 156 (H) 70 - 99 mg/dL   BUN 31 (H) 8 - 23 mg/dL   Creatinine, Ser 1.44 (H) 0.61 - 1.24 mg/dL   Calcium 8.9 8.9 - 10.3 mg/dL   GFR calc non Af Amer 52 (L) >60 mL/min   GFR calc Af Amer >60 >60 mL/min   Anion gap 6 5 - 15    Comment: Performed at [facility], [place] [address] [place], [place] [address]  CBC with Differential (PNL)     Status: Abnormal   Collection Time: 08/11/18  8:34 PM  Result Value Ref Range   WBC 14.6 (H) 4.0 - 10.5 K/uL   RBC 3.95 (L) 4.22 - 5.81 MIL/uL   Hemoglobin 11.7 (L) 13.0 - 17.0 g/dL   HCT 35.2 (L) 39.0 - 52.0 %   MCV 89.1 80.0 - 100.0 fL   MCH 29.6 26.0 - 34.0 pg   MCHC 33.2 30.0 - 36.0 g/dL   RDW 14.2 11.5 - 15.5 %   Platelets 408 (H) 150 - 400 K/uL   nRBC 0.0 0.0 - 0.2 %   Neutrophils Relative % 85 %   Neutro Abs 12.3 (H) 1.7 - 7.7 K/uL   Lymphocytes Relative 9 %   Lymphs Abs 1.3 0.7 - 4.0 K/uL   Monocytes Relative 6 %   Monocytes Absolute 0.8 0.1 - 1.0 K/uL   Eosinophils Relative 0 %   Eosinophils Absolute 0.0 0.0 - 0.5 K/uL   Basophils Relative 0 %   Basophils  Absolute 0.1 0.0 - 0.1 K/uL   Immature Granulocytes 0 %   Abs Immature Granulocytes 0.06 0.00 - 0.07 K/uL    Comment: Performed at [facility], [place] [address], [place], [place] [address]  Urinalysis, Routine w reflex microscopic     Status: Abnormal   Collection Time: 08/11/18  8:34 PM  Result Value Ref Range   Color, Urine  YELLOW YELLOW   APPearance CLEAR CLEAR   Specific Gravity, Urine 1.013 1.005 - 1.030   pH 5.0 5.0 - 8.0   Glucose, UA >=500 (A) NEGATIVE mg/dL   Hgb urine dipstick NEGATIVE NEGATIVE   Bilirubin Urine NEGATIVE NEGATIVE   Ketones, ur NEGATIVE NEGATIVE mg/dL   Protein, ur 100 (A) NEGATIVE mg/dL   Nitrite NEGATIVE NEGATIVE   Leukocytes,Ua NEGATIVE NEGATIVE    Comment: Performed at [place] [address] [place], [place] [address]  Ethanol     Status: None   Collection Time: 08/11/18  8:34 PM  Result Value Ref Range   Alcohol, Ethyl (B) <10 <10 mg/dL    Comment: (NOTE) Lowest detectable limit for serum alcohol is 10 mg/dL. For medical purposes only. Performed at [facility], [place] [address] [place], [place] [address]   Rapid urine drug screen (hospital performed)     Status: Abnormal   Collection Time: 08/11/18  8:34 PM  Result Value Ref Range   Opiates NONE DETECTED NONE DETECTED   Cocaine POSITIVE (A) NONE DETECTED   Benzodiazepines NONE DETECTED NONE DETECTED   Amphetamines NONE DETECTED NONE DETECTED   Tetrahydrocannabinol POSITIVE (A) NONE DETECTED   Barbiturates NONE DETECTED NONE DETECTED    Comment: (NOTE) DRUG SCREEN FOR MEDICAL PURPOSES ONLY.  IF CONFIRMATION IS NEEDED FOR ANY PURPOSE, NOTIFY LAB WITHIN 5 DAYS. LOWEST DETECTABLE LIMITS FOR URINE DRUG SCREEN Drug Class                     Cutoff (ng/mL) Amphetamine and metabolites    1000 Barbiturate and metabolites    200 Benzodiazepine                 130 Tricyclics and metabolites     300 Opiates and metabolites         300 Cocaine and metabolites        300 THC                            50 Performed at [facility], [place] [address] [place], [place] [address]   Magnesium     Status: Abnormal   Collection Time: 08/11/18  8:34 PM  Result Value Ref Range   Magnesium 2.5 (H) 1.7 - 2.4 mg/dL    Comment: Performed at [facility], [place] [address] [place], [place] [address]  CBG monitoring, ED     Status: Abnormal   Collection Time: 08/11/18  8:54 PM  Result Value Ref Range   Glucose-Capillary 113 (H) 70 - 99 mg/dL   Comment 1 Notify RN   CBG monitoring, ED     Status: None   Collection Time: 08/11/18 10:11 PM  Result Value Ref Range   Glucose-Capillary 72 70 - 99 mg/dL  CBG monitoring, ED     Status: Abnormal   Collection Time: 08/11/18 11:18 PM  Result Value Ref Range   Glucose-Capillary 37 (LL) 70 - 99 mg/dL   Comment 1 Document in Chart   CBG monitoring, ED     Status: Abnormal   Collection Time: 08/12/18 12:01 AM  Result Value Ref Range   Glucose-Capillary 144 (H) 70 - 99 mg/dL  CBG monitoring, ED     Status: Abnormal   Collection Time: 08/12/18  1:12 AM  Result Value Ref Range   Glucose-Capillary 23 (LL) 70 - 99 mg/dL   Comment 1 Notify RN  CBG monitoring, ED     Status: Abnormal   Collection Time: 08/12/18  1:15 AM  Result Value Ref Range   Glucose-Capillary 31 (LL) 70 - 99 mg/dL   Comment 1 Notify RN   Potassium     Status: None   Collection Time: 08/12/18  1:15 AM  Result Value Ref Range   Potassium 4.4 3.5 - 5.1 mmol/L    Comment: DELTA CHECK NOTED NO VISIBLE HEMOLYSIS Performed at [place] [address], [place], [place] [address]   CBG monitoring, ED     Status: Abnormal   Collection Time: 08/12/18  1:38 AM  Result Value Ref Range   Glucose-Capillary 113 (H) 70 - 99 mg/dL   Comment 1 Notify RN    No results found.  Pending Labs [employer] (From admission, onward)    Start     Ordered   Signed and Held   CBC  (enoxaparin (LOVENOX)    CrCl >/= 30 ml/min)  Once,   R    Comments:  Baseline for enoxaparin therapy IF NOT ALREADY DRAWN.  Notify MD if PLT < 100 K.    Signed and Held   Signed and Held  Creatinine, serum  (enoxaparin (LOVENOX)    CrCl >/= 30 ml/min)  Once,   R    Comments:  Baseline for enoxaparin therapy IF NOT ALREADY DRAWN.    Signed and Held   Signed and Held  Creatinine, serum  (enoxaparin (LOVENOX)    CrCl >/= 30 ml/min)  Weekly,   R    Comments:  while on enoxaparin therapy    Signed and Held   Signed and Held  Comprehensive metabolic panel  Tomorrow morning,   R     Signed and Held   Signed and Held  CBC  Tomorrow morning,   R     Signed and Held          Vitals/Pain Today's Vitals   08/12/18 0030 08/12/18 0100 08/12/18 0130 08/12/18 0200  BP: 123/62 (!) 117/50 (!) 137/59 112/63  Pulse: 75 72 79 73  Resp: (!) 24 20 19 11   Temp:      TempSrc:      SpO2: 96% 97% 96% 96%    Isolation Precautions No active isolations  Medications Medications  sodium chloride flush (NS) 0.9 % injection 3 mL (3 mLs Intravenous Given 08/11/18 2356)  sodium chloride flush (NS) 0.9 % injection 3 mL (3 mLs Intravenous Given 08/11/18 2332)  0.9 %  sodium chloride infusion (has no administration in time range)  dextrose 10 % infusion ( Intravenous New Bag/Given 08/12/18 0227)  dextrose 50 % solution 50 mL (50 mLs Intravenous Given 08/11/18 2015)  dextrose 50 % solution 50 mL (50 mLs Intravenous Given 08/11/18 2328)  dextrose 50 % solution 50 mL (50 mLs Intravenous Given 08/12/18 0121)    Mobility walks     Focused Assessments Hypoglycemia   R Recommendations: See Admitting Provider Note  Report given to:   Additional Notes:

## 2018-08-12 NOTE — ED Provider Notes (Signed)
Patient signed out at end of shift by Carmon Sails, PA-C. Here for hypoglycemia, history of same. History of T1DM, poor compliance, poor insight into his own care. States he took insulin earlier today but cannot remember what kind or how much. Frequent ED visits for hypoglycemia.   Plan: observe over time for CBG to stabilize. At the time of sign out his blood sugar was 72 (10:10 pm), patient eating snack and drinking juice.   (11:15 pm) - CBG 37. Patient awake, alert, oriented. Eating peanut butter and drinking juice. 1 amp D50 provided. Recheck CBG at 11:50 - 222. Potassium noted to be elevated at 5.8. Will repeat to verify and address.   1:20 - repeat CBG 23, and 30 on recheck with separate glucometer. Patient alert, awake. Amp D50 provided, D10 at 150/hr ordered. Hospitalist paged for admission.   Discussed with Dr. Jonelle Sidle who accepts the patient for admission.    Charlann Lange, PA-C 08/12/18 Antony [place]    Lacretia Leigh, MD 08/12/18 2252

## 2018-08-12 NOTE — ED Notes (Signed)
Pt given another glass of juice. Pt is awake and talking to this nurse.

## 2018-08-12 NOTE — H&P (Signed)
History and Physical   [name] FTD:[id number] DOB: 1955-07-20 DOA: 08/11/2018  Referring MD/NP/PA: Dr. Leonides Schanz  PCP: Patient, No Pcp Per   Outpatient Specialists: Infectious disease  Patient coming from: Home  Chief Complaint: Low blood sugar  HPI: [name] is a 63 y.o. male with medical history significant of type I diabetes with poor control, hypertension, chronic hepatitis C, vitiligo, polysubstance abuse, depression, medication noncompliance, hyperlipidemia who presented to the ER brought in by EMS secondary to blood sugar of 31.  Patient refused EMS transport initially.  They were called at 4:00 and then at 7:00 when the sugar was too low.  He arrived the ER sweaty lethargic and not following commands.  Patient initially received dextrose bolus and D10 glucose.  Blood sugar jumped to 222 on arrival.  In the ER however his blood sugar has continued to drop and not being maintained above 40-50.  Patient reported apparently taking insulin but not sure of the dose.  With his noncompliance not sure of what he took and when he took it.  He is awake now however blood sugar is still borderline below 60.  It responds to IV dextrose.  Patient is being admitted to the hospital for prolonged hypoglycemia.  He has history of polysubstance abuse including marijuana use.  Denied taking any IV drugs tonight.  He has no fever no chills.  No nausea vomiting or diarrhea..  ED Course: Initial temperature 98.3 blood pressure 102/52 with pulse 81 respiratory rate 24 oxygen sat 94% room air.  White count is 14.1 hemoglobin 11.7 platelet 466.  BUN 31 creatinine 1.44.  Glucose maintained in the 40s.  Patient initiated on D10 and being admitted to the hospital for treatment.  Review of Systems: As per HPI otherwise 10 point review of systems negative.    Past Medical History:  Diagnosis Date  . DEPRESSION   . DIABETES MELLITUS, TYPE I   . DRUG ABUSE    pt should have NO controlled substances rx'ed  .  Glaucoma   . HEPATITIS C    chronic  . Hypertension 02/18/2011  . 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 Vitiligo     Past Surgical History:  Procedure Laterality Date  . ABDOMINAL AORTOGRAM W/LOWER EXTREMITY N/A 07/25/2016   Procedure: Abdominal Aortogram w/Bilateral Lower Extremity Runoff;  Surgeon: Conrad [place], MD;  Location: [place] [facility];  Service: Cardiovascular;  Laterality: N/A;  . ABDOMINAL AORTOGRAM W/LOWER EXTREMITY N/A 12/24/2016   Procedure: ABDOMINAL AORTOGRAM W/LOWER EXTREMITY;  Surgeon: Serafina Mitchell, MD;  Location: [place] [facility];  Service: Cardiovascular;  Laterality: N/A;  . AMPUTATION TOE Right 07/26/2016   Procedure: AMPUTATION GREAT TOE;  Surgeon: Serafina Mitchell, MD;  Location: [place];  Service: Vascular;  Laterality: Right;  . EYE SURGERY     retinal surgery x 2, right eye  . INSERTION OF ILIAC STENT Right 07/26/2016   Procedure: INSERTION OF RIGHT POPLITEAL STENT WITH BALLOON ANGIOPLASTY;  Surgeon: Serafina Mitchell, MD;  Location: [place];  Service: Vascular;  Laterality: Right;  . LOWER EXTREMITY ANGIOGRAM Right 07/26/2016   Procedure: LOWER EXTREMITY ANGIOGRAM;  Surgeon: Serafina Mitchell, MD;  Location: [place];  Service: Vascular;  Laterality: Right;  . PERIPHERAL VASCULAR BALLOON ANGIOPLASTY Right 07/26/2016   Procedure: PERIPHERAL VASCULAR BALLOON ANGIOPLASTY  RIGHT ANTERIOR TIBEAL ARTERY AND RIGHT SUPERFICIAL FEMORAL ARTERY;  Surgeon: Serafina Mitchell, MD;  Location: [place];  Service: Vascular;  Laterality: Right;  . PERIPHERAL  VASCULAR INTERVENTION Right 12/24/2016   Procedure: PERIPHERAL VASCULAR INTERVENTION;  Surgeon: Serafina Mitchell, MD;  Location: [place] [facility];  Service: Cardiovascular;  Laterality: Right;  lower extr     reports that he has been smoking cigarettes. He has been smoking about 0.50 packs per day. He has never used smokeless tobacco. He reports current drug use. Drugs: "Crack" cocaine, Marijuana, and Heroin. He reports that he  does not drink alcohol.  Allergies  Allergen Reactions  . Codeine Itching    Tolerates hydrocodone/apap    Family History  Problem Relation Age of Onset  . Diabetes Father   . Kidney disease Father   . Arthritis Mother   . Heart disease Mother        CAD     Prior to Admission medications   Medication Sig Start Date End Date Taking? Authorizing Provider  amLODipine (NORVASC) 10 MG tablet Take 1 tablet (10 mg total) by mouth daily. 02/27/18 03/29/18  Medina-Vargas, Monina C, NP  aspirin 81 MG chewable tablet Chew 1 tablet (81 mg total) by mouth daily. 02/12/18   Hosie Poisson, MD  benzonatate (TESSALON) 200 MG capsule Take 1 capsule (200 mg total) by mouth 3 (three) times daily as needed for cough. 02/27/18   Medina-Vargas, Monina C, NP  blood glucose meter kit and supplies KIT Dispense based on patient and insurance preference. Use up to four times daily as directed. (FOR ICD-9 250.00, 250.01). 02/27/18   Medina-Vargas, Monina C, NP  gabapentin (NEURONTIN) 300 MG capsule Take 1 capsule (300 mg total) by mouth every 6 (six) hours. 02/27/18   Medina-Vargas, Monina C, NP  hydrALAZINE (APRESOLINE) 25 MG tablet Take 1 tablet (25 mg total) by mouth every 8 (eight) hours. 02/27/18   Medina-Vargas, Monina C, NP  hydrocortisone (ANUSOL-HC) 2.5 % rectal cream Apply rectally 2 times daily 02/12/18 02/12/19  Hosie Poisson, MD  insulin aspart (NOVOLOG) 100 UNIT/ML injection CBG 70 - 120: 0 units CBG 121 - 150: 3 units CBG 151 - 200: 4 units CBG 201 - 250: 7 units CBG 251 - 300: 11 units CBG 301 - 350: 15 units CBG 351 - 400: 20 units 02/11/18   Hosie Poisson, MD  insulin aspart (NOVOLOG) 100 UNIT/ML injection Inject 12-20 Units into the skin 2 (two) times daily. 100-200=12 units, 201-300=15 units, >300=20 units.    [provider]  insulin glargine (LANTUS) 100 UNIT/ML injection Inject 0.25 mLs (25 Units total) into the skin daily. 02/12/18   Hosie Poisson, MD  Isopropyl Alcohol Wipes  70 % MISC Apply 1 each topically daily as needed. 02/27/18   Medina-Vargas, Monina C, NP  Lancets MISC 1 each by Does not apply route daily as needed. 02/27/18   Medina-Vargas, Monina C, NP  magnesium hydroxide (MILK OF MAGNESIA) 400 MG/5ML suspension Take 30 mLs by mouth daily as needed for mild constipation.    [provider]  metoCLOPramide (REGLAN) 5 MG tablet Take 1 tablet (5 mg total) by mouth 3 (three) times daily. 02/27/18 03/29/18  Medina-Vargas, Monina C, NP  pantoprazole (PROTONIX) 40 MG tablet Take 1 tablet (40 mg total) by mouth daily at 6 (six) AM. 02/27/18   Medina-Vargas, Monina C, NP  Sodium Phosphates (RA SALINE ENEMA) 19-7 GM/118ML ENEM Place 118 mLs rectally daily as needed (constipation).    [provider]    Physical Exam: Vitals:   08/12/18 0030 08/12/18 0100 08/12/18 0130 08/12/18 0200  BP: 123/62 (!) 117/50 (!) 137/59 112/63  Pulse: 75  72 79 73  Resp: (!) _0 Temp:      TempSrc:      SpO2: 96% 97% 96% 96%      Constitutional: Tremulous with mild confusion Vitals:   08/12/18 0030 08/12/18 0100 08/12/18 0130 08/12/18 0200  BP: 123/62 (!) 117/50 (!) 137/59 112/63  Pulse: 75 72 79 73  Resp: (!) _1 Temp:      TempSrc:      SpO2: 96% 97% 96% 96%   Eyes: PERRL, lids and conjunctivae normal ENMT: Mucous membranes are moist. Posterior pharynx clear of any exudate or lesions.Normal dentition.  Neck: normal, supple, no masses, no thyromegaly Respiratory: clear to auscultation bilaterally, no wheezing, no crackles. Normal respiratory effort. No accessory muscle use.  Cardiovascular: Regular rate and rhythm, no murmurs / rubs / gallops. No extremity edema. 2+ pedal pulses. No carotid bruits.  Abdomen: no tenderness, no masses palpated. No hepatosplenomegaly. Bowel sounds positive.  Musculoskeletal: no clubbing / cyanosis. No joint deformity upper and lower extremities. Good ROM, no contractures. Normal muscle tone.  Skin: Cold  and clammy skin, sweaty no rashes, lesions, ulcers. No induration Neurologic: CN 2-12 grossly intact. Sensation intact, DTR normal. Strength 5/5 in all 4.  Psychiatric: Mild confusion. Alert and oriented x 3. Normal mood.     Labs on Admission: I have personally reviewed following labs and imaging studies  CBC: Recent Labs  Lab 08/11/18 2034  WBC 14.6*  NEUTROABS 12.3*  HGB 11.7*  HCT 35.2*  MCV 89.1  PLT 102*   Basic Metabolic Panel: Recent Labs  Lab 08/11/18 2034 08/12/18 0115  NA 135  --   K 5.8* 4.4  CL 104  --   CO2 25  --   GLUCOSE 156*  --   BUN 31*  --   CREATININE 1.44*  --   CALCIUM 8.9  --   MG 2.5*  --    GFR: CrCl cannot be calculated (Unknown ideal weight.). Liver Function Tests: No results for input(s): AST, ALT, ALKPHOS, BILITOT, PROT, ALBUMIN in the last 168 hours. No results for input(s): LIPASE, AMYLASE in the last 168 hours. No results for input(s): AMMONIA in the last 168 hours. Coagulation Profile: No results for input(s): INR, PROTIME in the last 168 hours. Cardiac Enzymes: No results for input(s): CKTOTAL, CKMB, CKMBINDEX, TROPONINI in the last 168 hours. BNP (last 3 results) No results for input(s): PROBNP in the last 8760 hours. HbA1C: No results for input(s): HGBA1C in the last 72 hours. CBG: Recent Labs  Lab 08/11/18 2318 08/12/18 0001 08/12/18 0112 08/12/18 0115 08/12/18 0138  GLUCAP 37* 144* 23* 31* 113*   Lipid Profile: No results for input(s): CHOL, HDL, LDLCALC, TRIG, CHOLHDL, LDLDIRECT in the last 72 hours. Thyroid Function Tests: No results for input(s): TSH, T4TOTAL, FREET4, T3FREE, THYROIDAB in the last 72 hours. Anemia Panel: No results for input(s): VITAMINB12, FOLATE, FERRITIN, TIBC, IRON, RETICCTPCT in the last 72 hours. Urine analysis:    Component Value Date/Time   COLORURINE YELLOW 08/11/2018 2034   APPEARANCEUR CLEAR 08/11/2018 2034   LABSPEC 1.013 08/11/2018 2034   PHURINE 5.0 08/11/2018 2034    GLUCOSEU >=500 (A) 08/11/2018 2034   GLUCOSEU 500 04/28/2012 1149   HGBUR NEGATIVE 08/11/2018 2034   BILIRUBINUR NEGATIVE 08/11/2018 2034   BILIRUBINUR neg 04/28/2012 [place] 08/11/2018 2034   PROTEINUR 100 (A) 08/11/2018 2034   UROBILINOGEN 4.0 (H) 11/16/2013 1646   NITRITE NEGATIVE 08/11/2018 2034  LEUKOCYTESUR NEGATIVE 08/11/2018 2034   Sepsis Labs: _0 (procalcitonin:4,lacticidven:4) )No results found for this or any previous visit (from the past 240 hour(s)).   Radiological Exams on Admission: No results found.    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  We will continue to check capillary blood glucose every 1-2 hours.  Continue D10.  Allow patient to eat regular diet.  Maintain current regimen until blood sugar stabilizes.  Significant education was social services involvement in counseling after blood sugar is controlled.  #2 tobacco abuse: Initiate nicotine patch and counseling provided.  #3 hypertension: Blood pressure is elevated.  Initiate home regimen and monitor.  #4 chronic hepatitis C: Being followed by infectious disease.  #5 COPD: No acute exacerbation.  Continue to monitor.  #6 acute kidney injury: Probably superimposed with chronic kidney disease from diabetes.  Monitor renal function.   DVT prophylaxis: Lovenox Code Status: Full code Family Communication: No family at bedside Disposition Plan: To be determined Consults called: None Admission status: Inpatient  Severity of Illness: The appropriate patient status for this patient is INPATIENT. Inpatient status is judged to be reasonable and necessary in order to provide the required intensity  of service to ensure the patient's safety. The patient's presenting symptoms, physical exam findings, and initial radiographic and laboratory data in the context of their chronic comorbidities is felt to place them at high risk for further clinical deterioration. Furthermore, it is not anticipated that the patient will be medically stable for discharge from the hospital within 2 midnights of admission. The following factors support the patient status of inpatient.   " The patient's presenting symptoms include confusion due to hypoglycemia. " The worrisome physical exam findings include sweaty and tremulous. " The initial radiographic and laboratory data are worrisome because of blood sugar in the 30s. " The chronic co-morbidities include type 1 diabetes.   * I certify that at the point of admission it is my clinical judgment that the patient will require inpatient hospital care spanning beyond 2 midnights from the point of admission due to high intensity of service, high risk for further deterioration and high frequency of surveillance required.Barbette Merino MD [name] Pager [phone]  If 7PM-7AM, please contact night-coverage [email or web address] Password [email or web address]  08/12/2018, 2:19 AM

## 2018-08-12 NOTE — Progress Notes (Signed)
Inpatient Diabetes Program Recommendations  AACE/ADA: New Consensus Statement on Inpatient Glycemic Control (2015)  Target Ranges:  Prepandial:   less than 140 mg/dL      Peak postprandial:   less than 180 mg/dL (1-2 hours)      Critically ill patients:  140 - 180 mg/dL   Lab Results  Component Value Date   GLUCAP 170 (H) 08/12/2018   HGBA1C 10.1 (H) 01/30/2018    Review of Glycemic Control Results for [name], [name] (MRN [id number]) as of 08/12/2018 11:07  Ref. Range 08/12/2018 03:35 08/12/2018 04:02 08/12/2018 04:26 08/12/2018 04:56 08/12/2018 05:30 08/12/2018 06:11 08/12/2018 07:12 08/12/2018 08:57 08/12/2018 09:34  Glucose-Capillary Latest Ref Range: 70 - 99 mg/dL 41 (LL) 54 (L) 57 (L) 99 137 (H) 124 (H) 103 (H) 271 (H) 170 (H)   Diabetes history: DM1 Outpatient Diabetes medications: Novolin N 40 units q am Current orders for Inpatient glycemic control: None  Inpatient Diabetes Program Recommendations: -Novolog 70/30 insulin 10 units bid ac meals -Novolog  Sensitive correction tid + hs 0-5 units  Spoke with patient by phone (DM coordinator working remotely). Patient has been taking Novolin N 40 units q am and having multiple days of hypoglycemia in the afternoon and evening. States his son saved his life many times by being with him and recognizing he had a low blood sugar. Endocrinologist is Dr. Loanne Drilling with last office note noted is from 07-21-17. Patient states he now lives with his son and others in an apartment but they move around often. States willingness to do injections bid.  Will follow. Patient has had multiple admissions.States he needs a new meter and strips and reviewed with him [employer] options for meter and strips.  Thank you, Nani Gasser. Shantika Bermea, RN, MSN, CDE  Diabetes Coordinator Inpatient Glycemic Control Team Team Pager [phone] (8am-5pm) 08/12/2018 11:29 AM

## 2018-08-12 NOTE — ED Notes (Addendum)
Attempted to call report. Nurse unable to take report at this time.

## 2018-08-12 NOTE — Progress Notes (Signed)
Initial Nutrition Assessment  RD working remotely.   DOCUMENTATION CODES:   (unable to assess for malnutrition at this time. )  INTERVENTION:  - continue Ensure Enlive po BID, each supplement provides 350 kcal and 20 grams of protein - continue to encourage PO intakes.    NUTRITION DIAGNOSIS:   Altered nutrition lab value related to acute illness, chronic illness as evidenced by other (comment)(prolonged hypoglycemia at time of admission).  GOAL:   Patient will meet greater than or equal to 90% of their needs  MONITOR:   PO intake, Supplement acceptance, Labs, Weight trends  REASON FOR ASSESSMENT:   Malnutrition Screening Tool  ASSESSMENT:   63 y.o. male with medical history significant of poorly controlled type I DM, HTN, chronic hepatitis C, vitiligo, polysubstance abuse, depression, medication non-compliance, and hyperlipidemia. He presented to the ED via EMS 2/2 to CBG of 31 mg/dl. He arrived to the ED sweaty, lethargic, and not following commands. Patient admitted to the hospital for prolonged hypoglycemia.  He has history of polysubstance abuse including marijuana use.  BMI indicates normal weight. Patient consumed 100% of breakfast and lunch today (total of 1840 kcal, 87 grams protein). Patient reports good appetite with no issues chewing or swallowing now or PTA. He denies access to food being an issue. Notes indicate that patient lives with a family member and that patient had not had a glucometer at home.   Per chart review, current weight is 143 lb and weight on 02/26/18 was 149 lb. This indicates 6 lb weight loss (4% body weight) in the past 5.5 months; not significant for time frame.      Medications reviewed; 10 units novolog BID.  Labs reviewed; CBGs: 99-271 mg/dl since 5 AM, BUN: 31 mg/dl,     NUTRITION - FOCUSED PHYSICAL EXAM:  unable to complete at this time.   Diet Order:   Diet Order            Diet regular Room service appropriate? Yes; Fluid  consistency: Thin  Diet effective now              EDUCATION NEEDS:   Not appropriate for education at this time  Skin:  Skin Assessment: Reviewed RN Assessment  Last BM:  4/28  Height:   Ht Readings from Last 1 Encounters:  08/12/18 6' (1.829 m)    Weight:   Wt Readings from Last 1 Encounters:  08/12/18 64.7 kg    Ideal Body Weight:  80.9 kg  BMI:  Body mass index is 19.35 kg/m.  Estimated Nutritional Needs:   Kcal:  1840-2135 kcal  Protein:  78-90 grams  Fluid:  >/= 2 L/day     [name], MS, RD, LDN, [facility] Inpatient Clinical Dietitian Pager # [phone] After hours/weekend pager # [phone]

## 2018-08-12 NOTE — Progress Notes (Signed)
PT Cancellation Note  Patient Details Name: [name] MRN: [id number] DOB: 16-Oct-1955   Cancelled Treatment:    Reason Eval/Treat Not Completed: Patient at procedure or test/unavailable Working with OT   York Ram E 08/12/2018, 2:08 PM Carmelia Bake, PT, DPT Acute Rehabilitation Services Office: [phone] Pager: [phone]

## 2018-08-12 NOTE — ED Notes (Addendum)
Lab called stating the lab for potassium has hemolyzed. New tube sent down.

## 2018-08-13 DIAGNOSIS — J449 Chronic obstructive pulmonary disease, unspecified: Secondary | ICD-10-CM

## 2018-08-13 DIAGNOSIS — Z720 Tobacco use: Secondary | ICD-10-CM

## 2018-08-13 DIAGNOSIS — E1065 Type 1 diabetes mellitus with hyperglycemia: Secondary | ICD-10-CM

## 2018-08-13 DIAGNOSIS — E1029 Type 1 diabetes mellitus with other diabetic kidney complication: Secondary | ICD-10-CM

## 2018-08-13 DIAGNOSIS — F1910 Other psychoactive substance abuse, uncomplicated: Secondary | ICD-10-CM

## 2018-08-13 DIAGNOSIS — B182 Chronic viral hepatitis C: Secondary | ICD-10-CM

## 2018-08-13 LAB — CBC
HCT: 33.4 % — ABNORMAL LOW (ref 39.0–52.0)
Hemoglobin: 10.9 g/dL — ABNORMAL LOW (ref 13.0–17.0)
MCH: 29.4 pg (ref 26.0–34.0)
MCHC: 32.6 g/dL (ref 30.0–36.0)
MCV: 90 fL (ref 80.0–100.0)
Platelets: 436 10*3/uL — ABNORMAL HIGH (ref 150–400)
RBC: 3.71 MIL/uL — ABNORMAL LOW (ref 4.22–5.81)
RDW: 14 % (ref 11.5–15.5)
WBC: 8.4 10*3/uL (ref 4.0–10.5)
nRBC: 0 % (ref 0.0–0.2)

## 2018-08-13 LAB — HEMOGLOBIN A1C
Hgb A1c MFr Bld: 9.8 % — ABNORMAL HIGH (ref 4.8–5.6)
Mean Plasma Glucose: 234.56 mg/dL

## 2018-08-13 LAB — BASIC METABOLIC PANEL
Anion gap: 6 (ref 5–15)
BUN: 32 mg/dL — ABNORMAL HIGH (ref 8–23)
CO2: 25 mmol/L (ref 22–32)
Calcium: 8.5 mg/dL — ABNORMAL LOW (ref 8.9–10.3)
Chloride: 103 mmol/L (ref 98–111)
Creatinine, Ser: 1.4 mg/dL — ABNORMAL HIGH (ref 0.61–1.24)
GFR calc Af Amer: >60 mL/min (ref >60)
GFR calc non Af Amer: 54 mL/min — ABNORMAL LOW (ref >60)
Glucose, Bld: 327 mg/dL — ABNORMAL HIGH (ref 70–99)
Potassium: 4.9 mmol/L (ref 3.5–5.1)
Sodium: 134 mmol/L — ABNORMAL LOW (ref 135–145)

## 2018-08-13 LAB — GLUCOSE, CAPILLARY
Glucose-Capillary: 132 mg/dL — ABNORMAL HIGH (ref 70–99)
Glucose-Capillary: 140 mg/dL — ABNORMAL HIGH (ref 70–99)
Glucose-Capillary: 186 mg/dL — ABNORMAL HIGH (ref 70–99)
Glucose-Capillary: 331 mg/dL — ABNORMAL HIGH (ref 70–99)
Glucose-Capillary: 333 mg/dL — ABNORMAL HIGH (ref 70–99)
Glucose-Capillary: 359 mg/dL — ABNORMAL HIGH (ref 70–99)
Glucose-Capillary: 37 mg/dL — CL (ref 70–99)
Glucose-Capillary: 92 mg/dL (ref 70–99)

## 2018-08-13 MED ORDER — AMLODIPINE BESYLATE 5 MG PO TABS
5.0000 mg | ORAL_TABLET | Freq: Every day | ORAL | Status: DC
  Administered 2018-08-13 – 2018-08-15 (×3): 5 mg via ORAL
  Filled 2018-08-13 (×3): qty 1

## 2018-08-13 MED ORDER — GABAPENTIN 300 MG PO CAPS
300.0000 mg | ORAL_CAPSULE | Freq: Three times a day (TID) | ORAL | Status: DC
  Administered 2018-08-13 (×2): 300 mg via ORAL
  Filled 2018-08-13 (×2): qty 1

## 2018-08-13 MED ORDER — PANTOPRAZOLE SODIUM 40 MG PO TBEC
40.0000 mg | DELAYED_RELEASE_TABLET | Freq: Every day | ORAL | Status: DC
  Administered 2018-08-13 – 2018-08-15 (×3): 40 mg via ORAL
  Filled 2018-08-13 (×3): qty 1

## 2018-08-13 NOTE — Evaluation (Signed)
Clinical/Bedside Swallow Evaluation Patient Details  Name: [name] MRN: [id number] Date of Birth: 13-Mar-1956  Today's Date: 08/13/2018 Time: SLP Start Time (ACUTE ONLY): 70 SLP Stop Time (ACUTE ONLY): 1440 SLP Time Calculation (min) (ACUTE ONLY): 32 min  Past Medical History:  Past Medical History:  Diagnosis Date  . DEPRESSION   . DIABETES MELLITUS, TYPE I   . DRUG ABUSE    pt should have NO controlled substances rx'ed  . Glaucoma   . HEPATITIS C    chronic  . Hypertension 02/18/2011  . 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 Vitiligo    Past Surgical History:  Past Surgical History:  Procedure Laterality Date  . ABDOMINAL AORTOGRAM W/LOWER EXTREMITY N/A 07/25/2016   Procedure: Abdominal Aortogram w/Bilateral Lower Extremity Runoff;  Surgeon: Conrad [place], MD;  Location: [place] [facility];  Service: Cardiovascular;  Laterality: N/A;  . ABDOMINAL AORTOGRAM W/LOWER EXTREMITY N/A 12/24/2016   Procedure: ABDOMINAL AORTOGRAM W/LOWER EXTREMITY;  Surgeon: Serafina Mitchell, MD;  Location: [place] [facility];  Service: Cardiovascular;  Laterality: N/A;  . AMPUTATION TOE Right 07/26/2016   Procedure: AMPUTATION GREAT TOE;  Surgeon: Serafina Mitchell, MD;  Location: [place];  Service: Vascular;  Laterality: Right;  . EYE SURGERY     retinal surgery x 2, right eye  . INSERTION OF ILIAC STENT Right 07/26/2016   Procedure: INSERTION OF RIGHT POPLITEAL STENT WITH BALLOON ANGIOPLASTY;  Surgeon: Serafina Mitchell, MD;  Location: [place];  Service: Vascular;  Laterality: Right;  . LOWER EXTREMITY ANGIOGRAM Right 07/26/2016   Procedure: LOWER EXTREMITY ANGIOGRAM;  Surgeon: Serafina Mitchell, MD;  Location: [place];  Service: Vascular;  Laterality: Right;  . PERIPHERAL VASCULAR BALLOON ANGIOPLASTY Right 07/26/2016   Procedure: PERIPHERAL VASCULAR BALLOON ANGIOPLASTY  RIGHT ANTERIOR TIBEAL ARTERY AND RIGHT SUPERFICIAL FEMORAL ARTERY;  Surgeon: Serafina Mitchell, MD;  Location: [place];  Service: Vascular;   Laterality: Right;  . PERIPHERAL VASCULAR INTERVENTION Right 12/24/2016   Procedure: PERIPHERAL VASCULAR INTERVENTION;  Surgeon: Serafina Mitchell, MD;  Location: [place] [facility];  Service: Cardiovascular;  Laterality: Right;  lower extr   HPI:  63 yo male adm with low blood sugar. PMH:  DM type 1, noncompliance, HTN, chronic Hep C, polysubstance abuse and depression.  Pt reports h/o dysphagia diagnosed Oct 2019 when admitted to hospital.  He aspirated thin and was placed on nectar thick liquids.   Order for swallow evaluation received during this admit.  Pt admits to problems with swallowing, reports backflow of liquids with resultant sour taste.  In addition, he points to mid and distal esophagus to indicate area of discomfort.  He denies taking a PPI nor Tums, Rolaids prior to admit.     Assessment / Plan / Recommendation Clinical Impression  Patient passed 3 ounce Yale water test without deficits, timely swallow and no cough.    He easily managed intake of applesauce, saltine crackers and water without indication of aspiration or complaint of dysphagia.  Poor dentition and dental pain associated with poor mastication abilities,  thus he consumes soft foods that he can tolerate.     He admits to problems with swallowing, reports refluxing with resultant sour taste.  In addition, he points to mid and distal esophagus to indicate area of discomfort.   SLP advised pt that his symptoms are not consistent with an oropharyngeal dysphagia.  He denies taking a PPI nor Tums, Rolaids prior to admit. Pt inquired regarding need to see dentist  for dentition removal and denture creation.    Advised him to importance of discontinuing negative behaviors that contribute to his health issues.  Reviewed prior MBS studies with pt using live video and teach back for aspiration precautions.     SLP Visit Diagnosis: Dysphagia, unspecified (R13.10)    Aspiration Risk  Moderate aspiration risk    Diet Recommendation  Regular;Thin liquid   Liquid Administration via: Cup;Straw Medication Administration: Whole meds with liquid Supervision: Patient able to self feed Compensations: Slow rate;Small sips/bites Postural Changes: Seated upright at 90 degrees;Remain upright for at least 30 minutes after po intake    Other  Recommendations Oral Care Recommendations: Oral care BID   Follow up Recommendations None      Frequency and Duration     n/a       Prognosis   n/a     Swallow Study   General Date of Onset: 08/13/18 HPI: 63 yo male adm with low blood sugar. PMH:  DM type 1, noncompliance, HTN, chronic Hep C, polysubstance abuse and depression.  Pt reports h/o dysphagia diagnosed Oct 2019 when admitted to hospital.  He aspirated thin and was placed on nectar thick liquids.   Order for swallow evaluation received during this admit.  Pt admits to problems with swallowing, reports backflow of liquids with resultant sour taste.  In addition, he points to mid and distal esophagus to indicate area of discomfort.  He denies taking a PPI nor Tums, Rolaids prior to admit.   Type of Study: Bedside Swallow Evaluation Diet Prior to this Study: Thin liquids;Regular Temperature Spikes Noted: No Respiratory Status: Room air History of Recent Intubation: No Behavior/Cognition: Alert;Cooperative;Pleasant mood Oral Care Completed by SLP: Yes(pt provided with toothbrush/paste to conduct oral care independently) Oral Cavity - Dentition: Other (Comment);Poor condition Vision: Functional for self-feeding Self-Feeding Abilities: Able to feed self Patient Positioning: Upright in bed Baseline Vocal Quality: Normal Volitional Cough: Strong Volitional Swallow: Able to elicit    Oral/Motor/Sensory Function Overall Oral Motor/Sensory Function: Within functional limits   Ice Chips Ice chips: Not tested   Thin Liquid Thin Liquid: Within functional limits Presentation: Cup Other Comments: pt easily passed 3 ounce Yale water  test    Nectar Thick Nectar Thick Liquid: Not tested   Honey Thick Honey Thick Liquid: Not tested   Puree Puree: Within functional limits Presentation: Self Fed;Spoon   Solid     Solid: Within functional limits Presentation: Self Fredirick Lathe 08/13/2018,3:13 PM   Luanna Salk, [facility] SLP [place] Pager [phone] Office [phone]

## 2018-08-13 NOTE — Progress Notes (Signed)
Inpatient Diabetes Program Recommendations  AACE/ADA: New Consensus Statement on Inpatient Glycemic Control (2015)  Target Ranges:  Prepandial:   less than 140 mg/dL      Peak postprandial:   less than 180 mg/dL (1-2 hours)      Critically ill patients:  140 - 180 mg/dL   Lab Results  Component Value Date   GLUCAP 331 (H) 08/13/2018   HGBA1C 10.1 (H) 01/30/2018   Results for Sassone, BRONTE KROPF (MRN [id number]) as of 08/13/2018 09:57  Ref. Range 08/12/2018 21:43 08/12/2018 23:38 08/13/2018 01:37 08/13/2018 03:07 08/13/2018 05:18 08/13/2018 07:29  Glucose-Capillary Latest Ref Range: 70 - 99 mg/dL 286 (H) Novolog 3 units 87 37 (LL) 186 (H) 333 (H) 331 (H)    Inpatient Diabetes Program Recommendations:   Patient had hypoglycemia post Novolog correction. Discussed with Dr. Ree Kida and plans to D/C hs correction scale. Patient is poor historian as to how much insulin he sometimes takes @ home.  Thank you, Nani Gasser. Beverlie Kurihara, RN, MSN, CDE  Diabetes Coordinator Inpatient Glycemic Control Team Team Pager [phone] (8am-5pm) 08/13/2018 11:05 AM
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
## 2018-08-13 NOTE — Progress Notes (Addendum)
PROGRESS NOTE    BRANTLEY WILEY  SWF:[id number] DOB: 1956-01-02 DOA: 08/11/2018 PCP: Patient, No Pcp Per   Brief Narrative:  HPI On 08/12/2018 by Dr. Gala Romney MARRION ACCOMANDO is a 63 y.o. male with medical history significant of type I diabetes with poor control, hypertension, chronic hepatitis C, vitiligo, polysubstance abuse, depression, medication noncompliance, hyperlipidemia who presented to the ER brought in by EMS secondary to blood sugar of 31.  Patient refused EMS transport initially.  They were called at 4:00 and then at 7:00 when the sugar was too low.  He arrived the ER sweaty lethargic and not following commands.  Patient initially received dextrose bolus and D10 glucose.  Blood sugar jumped to 222 on arrival.  In the ER however his blood sugar has continued to drop and not being maintained above 40-50.  Patient reported apparently taking insulin but not sure of the dose.  With his noncompliance not sure of what he took and when he took it.  He is awake now however blood sugar is still borderline below 60.  It responds to IV dextrose.  Patient is being admitted to the hospital for prolonged hypoglycemia.  He has history of polysubstance abuse including marijuana use.  Denied taking any IV drugs tonight.  He has no fever no chills.  No nausea vomiting or diarrhea..  Interim history Patient admitted for hypoglycemia. Assessment & Plan   Persistent hypoglycemia/uncontrolled diabetes mellitus -?  Unknown cause, possibly inadvertent insulin overdose -Continue to monitor CBGs every 1-2 hours -Discontinued D10 infusion -will change diet to carb modified  -last hemoglobin A1c 10.1 on 01/30/2018 -Diabetes coordinator consulted, recommending Novolog 70/30 10u BID with sensitive ISS  -will discontinue ISS QHS and continue to monitor closely -will order A1c  Chronic kidney disease, stage III -Creatinine stable  Tobacco abuse -Smoking cessation counseling provided, continue nicotine patch  Essential hypertension  -uncontrolled, will restart amlodipine   Chronic hepatitis C -Followed by infectious disease  COPD -Currently stable, not in acute exacerbation  Leukocytosis -Likely reactive, continue to monitor CBC  Polysubstance abuse -urine tox screen +cocaine and THC -discussed cessation   DVT Prophylaxis Lovenox  Code Status: Full  Family Communication: None at bedside  Disposition Plan: Admitted.  Pending improvement in blood sugars and stabilization.  Suspect home on discharge  Consultants None  Procedures  None  Antibiotics   Anti-infectives (From admission, onward)   None      Subjective:   [name] seen and examined today.  Continues to complain of pain in his hand and fingers but does not want an xray. Continues to complain of rib pain. Denies shortness of breath, abdominal pain, nausea, vomiting, dizziness, headache.  Objective:   Vitals:   08/12/18 1205 08/12/18 1315 08/12/18 1940 08/13/18 0521  BP:  (!) 149/78 (!) 146/60 (!) 164/76  Pulse:  81 85 81  Resp:  19 15 16   Temp:  98.6 F (37 C) 98.6 F (37 C) 99.3 F (37.4 C)  TempSrc:  Oral Oral Oral  SpO2: 94% 99% 97% 96%  Weight:      Height:        Intake/Output Summary (Last 24 hours) at 08/13/2018 0955 Last data filed at 08/13/2018 0300 Gross per 24 hour  Intake 2219.19 ml  Output 300 ml  Net 1919.19 ml   Filed Weights   08/12/18 0400  Weight: 64.7 kg   Exam  General: Well developed, chronically ill appearing, NAD  HEENT: NCAT,mucous membranes moist.   Neck:  Suppl  Cardiovascular: S1 S2 auscultated,RRR  Respiratory: Clear to auscultation bilaterally  Abdomen: Soft, nontender, nondistended, + bowel sounds  Extremities: warm dry without cyanosis clubbing or edema  Neuro: AAOx3, nonfocal  Psych: Appropriate mood and affect   Data Reviewed: I have personally reviewed following labs and imaging studies  CBC: Recent Labs  Lab 08/11/18 2034 08/12/18 0428  08/13/18 0453  WBC 14.6* 14.1* 8.4  NEUTROABS 12.3*  --   --   HGB 11.7* 11.7* 10.9*  HCT 35.2* 36.1* 33.4*  MCV 89.1 90.3 90.0  PLT 408* 466* 169*   Basic Metabolic Panel: Recent Labs  Lab 08/11/18 2034 08/12/18 0115 08/12/18 0428 08/13/18 0453  NA 135  --  137 134*  K 5.8* 4.4 4.7 4.9  CL 104  --  102 103  CO2 25  --  27 25  GLUCOSE 156*  --  65* 327*  BUN 31*  --  31* 32*  CREATININE 1.44*  --  1.40* 1.40*  CALCIUM 8.9  --  8.9 8.5*  MG 2.5*  --   --   --    GFR: Estimated Creatinine Clearance: 50.7 mL/min (A) (by C-G formula based on SCr of 1.4 mg/dL (H)). Liver Function Tests: Recent Labs  Lab 08/12/18 0428  AST 21  ALT 18  ALKPHOS 105  BILITOT 0.4  PROT 6.4*  ALBUMIN 3.0*   No results for input(s): LIPASE, AMYLASE in the last 168 hours. No results for input(s): AMMONIA in the last 168 hours. Coagulation Profile: No results for input(s): INR, PROTIME in the last 168 hours. Cardiac Enzymes: No results for input(s): CKTOTAL, CKMB, CKMBINDEX, TROPONINI in the last 168 hours. BNP (last 3 results) No results for input(s): PROBNP in the last 8760 hours. HbA1C: No results for input(s): HGBA1C in the last 72 hours. CBG: Recent Labs  Lab 08/12/18 2338 08/13/18 0137 08/13/18 0307 08/13/18 0518 08/13/18 0729  GLUCAP 87 37* 186* 333* 331*   Lipid Profile: No results for input(s): CHOL, HDL, LDLCALC, TRIG, CHOLHDL, LDLDIRECT in the last 72 hours. Thyroid Function Tests: No results for input(s): TSH, T4TOTAL, FREET4, T3FREE, THYROIDAB in the last 72 hours. Anemia Panel: No results for input(s): VITAMINB12, FOLATE, FERRITIN, TIBC, IRON, RETICCTPCT in the last 72 hours. Urine analysis:    Component Value Date/Time   COLORURINE YELLOW 08/11/2018 2034   APPEARANCEUR CLEAR 08/11/2018 2034   LABSPEC 1.013 08/11/2018 2034   PHURINE 5.0 08/11/2018 2034   GLUCOSEU >=500 (A) 08/11/2018 2034   GLUCOSEU 500 04/28/2012 1149   HGBUR NEGATIVE 08/11/2018 2034    BILIRUBINUR NEGATIVE 08/11/2018 2034   BILIRUBINUR neg 04/28/2012 [place] 08/11/2018 2034   PROTEINUR 100 (A) 08/11/2018 2034   UROBILINOGEN 4.0 (H) 11/16/2013 1646   NITRITE NEGATIVE 08/11/2018 2034   LEUKOCYTESUR NEGATIVE 08/11/2018 2034   Sepsis Labs: @LABRCNTIP (procalcitonin:4,lacticidven:4)  )No results found for this or any previous visit (from the past 240 hour(s)).    Radiology Studies: Dg Chest 2 View  Result Date: 08/12/2018 CLINICAL DATA:  Left rib pain after fall. EXAM: CHEST - 2 VIEW COMPARISON:  Radiograph of June 15, 2018. FINDINGS: The heart size and mediastinal contours are within normal limits. Both lungs are clear. No pneumothorax or pleural effusion is noted. Old left rib fractures are noted. IMPRESSION: No active cardiopulmonary disease. Electronically Signed   By: Marijo Conception M.D.   On: 08/12/2018 09:27   Dg Foot Complete Right  Result Date: 08/12/2018 CLINICAL DATA:  Toe pain after  fall. EXAM: RIGHT FOOT COMPLETE - 3+ VIEW COMPARISON:  Radiographs of April 19, 2017 and July 27, 2016. FINDINGS: Status post amputation of first toe. Vascular calcifications are noted. There is noted posterior dislocation of the second middle phalanx relative to the second proximal phalanx. No definite fracture is noted. Linear metallic density is seen in the plantar soft tissues posteriorly underneath the calcaneus consistent with foreign body which was present on prior exam. IMPRESSION: Posterior dislocation of second middle phalanx relative to the second proximal phalanx. No definite fracture is noted. Linear metallic foreign body is seen in plantar soft tissues underneath the calcaneus which was present on prior exam. Electronically Signed   By: Marijo Conception M.D.   On: 08/12/2018 09:30     Scheduled Meds: . enoxaparin (LOVENOX) injection  40 mg Subcutaneous Daily  . feeding supplement (ENSURE ENLIVE)  237 mL Oral BID BM  . insulin aspart  0-5 Units  Subcutaneous QHS  . insulin aspart  0-9 Units Subcutaneous TID WC  . insulin aspart protamine- aspart  10 Units Subcutaneous BID WC  . nicotine  21 mg Transdermal Daily  . sodium chloride flush  3 mL Intravenous Q12H   Continuous Infusions: . sodium chloride       LOS: 1 day   Time Spent in minutes   45 minutes ((greater than 50% of time spent with patient face to face, as well as reviewing old records, discussing with diabetes coordinator, and formulating a plan)  Cristal Ford D.O. on 08/13/2018 at 9:55 AM  Between 7am to 7pm - Please see pager noted on [email or web address]  After 7pm go to [email or web address]  And look for the night coverage person covering for me after hours  [facility] Office  [phone]

## 2018-08-13 NOTE — Plan of Care (Signed)
  Problem: Clinical Measurements: Goal: Diagnostic test results will improve Outcome: Progressing   Problem: Coping: Goal: Level of anxiety will decrease Outcome: Progressing   Problem: Education: Goal: Knowledge of General Education information will improve Description Including pain rating scale, medication(s)/side effects and non-pharmacologic comfort measures Outcome: Not Progressing   Problem: Health Behavior/Discharge Planning: Goal: Ability to manage health-related needs will improve Outcome: Not Progressing   Problem: Clinical Measurements: Goal: Ability to maintain clinical measurements within normal limits will improve Outcome: Not Progressing   Problem: Clinical Measurements: Goal: Will remain free from infection Outcome: Adequate for Discharge Goal: Respiratory complications will improve Outcome: Adequate for Discharge Goal: Cardiovascular complication will be avoided Outcome: Adequate for Discharge   Problem: Activity: Goal: Risk for activity intolerance will decrease Outcome: Adequate for Discharge   Problem: Nutrition: Goal: Adequate nutrition will be maintained Outcome: Adequate for Discharge   Problem: Elimination: Goal: Will not experience complications related to bowel motility Outcome: Adequate for Discharge Goal: Will not experience complications related to urinary retention Outcome: Adequate for Discharge   Problem: Pain Managment: Goal: General experience of comfort will improve Outcome: Adequate for Discharge

## 2018-08-14 LAB — GLUCOSE, CAPILLARY
Glucose-Capillary: 23 mg/dL — CL (ref 70–99)
Glucose-Capillary: 312 mg/dL — ABNORMAL HIGH (ref 70–99)
Glucose-Capillary: 355 mg/dL — ABNORMAL HIGH (ref 70–99)
Glucose-Capillary: 442 mg/dL — ABNORMAL HIGH (ref 70–99)
Glucose-Capillary: 357 mg/dL — ABNORMAL HIGH (ref 70–99)
Glucose-Capillary: 157 mg/dL — ABNORMAL HIGH (ref 70–99)
Glucose-Capillary: 241 mg/dL — ABNORMAL HIGH (ref 70–99)

## 2018-08-14 MED ORDER — INSULIN ASPART 100 UNIT/ML ~~LOC~~ SOLN
0.0000 [IU] | Freq: Three times a day (TID) | SUBCUTANEOUS | Status: DC
  Administered 2018-08-14: 5 [IU] via SUBCUTANEOUS
  Administered 2018-08-14: 1 [IU] via SUBCUTANEOUS
  Administered 2018-08-15: 3 [IU] via SUBCUTANEOUS

## 2018-08-14 MED ORDER — INSULIN ASPART PROT & ASPART (70-30 MIX) 100 UNIT/ML ~~LOC~~ SUSP
15.0000 [IU] | Freq: Two times a day (BID) | SUBCUTANEOUS | Status: DC
  Administered 2018-08-14 – 2018-08-15 (×2): 15 [IU] via SUBCUTANEOUS

## 2018-08-14 MED ORDER — INSULIN ASPART PROT & ASPART (70-30 MIX) 100 UNIT/ML ~~LOC~~ SUSP
5.0000 [IU] | SUBCUTANEOUS | Status: AC
  Administered 2018-08-14: 5 [IU] via SUBCUTANEOUS

## 2018-08-14 MED ORDER — GABAPENTIN 400 MG PO CAPS
400.0000 mg | ORAL_CAPSULE | Freq: Three times a day (TID) | ORAL | Status: DC
  Administered 2018-08-14 – 2018-08-15 (×4): 400 mg via ORAL
  Filled 2018-08-14 (×4): qty 1

## 2018-08-14 NOTE — Progress Notes (Signed)
Inpatient Diabetes Program Recommendations  AACE/ADA: New Consensus Statement on Inpatient Glycemic Control (2015)  Target Ranges:  Prepandial:   less than 140 mg/dL      Peak postprandial:   less than 180 mg/dL (1-2 hours)      Critically ill patients:  140 - 180 mg/dL   Lab Results  Component Value Date   GLUCAP 355 (H) 08/14/2018   HGBA1C 9.8 (H) 08/13/2018    Review of Glycemic Control Results for [name], [name] (MRN [id number]) as of 08/14/2018 09:09  Ref. Range 08/13/2018 16:26 08/13/2018 19:44 08/13/2018 23:56 08/14/2018 04:06 08/14/2018 08:20  Glucose-Capillary Latest Ref Range: 70 - 99 mg/dL 92 132 (H) 140 (H) 312 (H) 355 (H)    Inpatient Diabetes Program Recommendations:   -Increase 70/30 insulin to 15 units bid -Decrease Novolog correction to custom scale tid -Custom Novolog correction scale 0-5 units       151-200  1 unit      201-250  2 units      251-300  3 units      301-350  4 units      351-400  5 units  Thank you, [name] Roys E. Stephens [place], RN, MSN, CDE  Diabetes Coordinator Inpatient Glycemic Control Team Team Pager [phone] (8am-5pm) 08/14/2018 9:18 AM

## 2018-08-14 NOTE — Progress Notes (Signed)
Physical Therapy Treatment Patient Details Name: [name] MRN: [id number] DOB: [birth year] Today's Date: 08/14/2018    History of Present Illness Pt was admitted for low blood sugar. PMH:  DM type 1, noncompliance, HTN, chronic Hep C, polysubstance abuse and depression    PT Comments    Pt assisted with ambulating in hallway.  Pt reports he wishes to d/c home however states his "sugars keep changing."  Recommend a RW for improved stability however pt will likely only accept SPC.  Follow Up Recommendations  Supervision for mobility/OOB;Home health PT     Equipment Recommendations  Rolling walker with 5" wheels;Cane(needs RW however states he will only use SPC)    Recommendations for Other Services       Precautions / Restrictions Precautions Precautions: Fall Restrictions Weight Bearing Restrictions: No    Mobility  Bed Mobility Overal bed mobility: Modified Independent                Transfers Overall transfer level: Needs assistance Equipment used: Rolling walker (2 wheeled) Transfers: Sit to/from Stand Sit to Stand: Supervision         General transfer comment: cues for safety  Ambulation/Gait Ambulation/Gait assistance: Min guard Gait Distance (Feet): 350 Feet Assistive device: Rolling walker (2 wheeled) Gait Pattern/deviations: Step-through pattern;Decreased stride length     General Gait Details: improved stability today compared to yesterday, pt performs increased hip/knee flexion in swing to clear feet (decreased DF and heel strike)   Stairs             Wheelchair Mobility    Modified Rankin (Stroke Patients Only)       Balance                                            Cognition Arousal/Alertness: Awake/alert Behavior During Therapy: WFL for tasks assessed/performed Overall Cognitive Status: No family/caregiver present to determine baseline cognitive functioning                                  General Comments: decreased safety awareness and memory, repeats same stories within a session      Exercises      General Comments        Pertinent Vitals/Pain Pain Assessment: Faces Faces Pain Scale: Hurts a little bit Pain Location: bil feet from neuropathy Pain Descriptors / Indicators: (neuropathy) Pain Intervention(s): Monitored during session;Repositioned    Home Living                      Prior Function            PT Goals (current goals can now be found in the care plan section) Progress towards PT goals: Progressing toward goals    Frequency    Min 3X/week      PT Plan Current plan remains appropriate    Co-evaluation              AM-PAC PT "6 Clicks" Mobility   Outcome Measure  Help needed turning from your back to your side while in a flat bed without using bedrails?: None Help needed moving from lying on your back to sitting on the side of a flat bed without using bedrails?: None Help needed moving to and from a bed to a chair (including a  wheelchair)?: A Little Help needed standing up from a chair using your arms (e.g., wheelchair or bedside chair)?: A Little Help needed to walk in hospital room?: A Little Help needed climbing 3-5 steps with a railing? : A Little 6 Click Score: 20    End of Session Equipment Utilized During Treatment: Gait belt Activity Tolerance: Patient tolerated treatment well Patient left: in bed;with call bell/phone within reach;with bed alarm set Nurse Communication: Mobility status PT Visit Diagnosis: Other abnormalities of gait and mobility (R26.89)     Time: 2956-2130 PT Time Calculation (min) (ACUTE ONLY): 10 min  Charges:  $Gait Training: 8-22 mins                    Carmelia Bake, PT, DPT Acute Rehabilitation Services Office: [phone] Pager: [phone]  Trena Platt 08/14/2018, 12:51 PM

## 2018-08-14 NOTE — Progress Notes (Signed)
PROGRESS NOTE    [name]  [name]:[id number] DOB: Apr 15, 1956 DOA: 08/11/2018 PCP: Patient, No Pcp Per   Brief Narrative:  HPI On 08/12/2018 by Dr. Gala Romney [name] is a 63 y.o. male with medical history significant of type I diabetes with poor control, hypertension, chronic hepatitis C, vitiligo, polysubstance abuse, depression, medication noncompliance, hyperlipidemia who presented to the ER brought in by EMS secondary to blood sugar of 31.  Patient refused EMS transport initially.  They were called at 4:00 and then at 7:00 when the sugar was too low.  He arrived the ER sweaty lethargic and not following commands.  Patient initially received dextrose bolus and D10 glucose.  Blood sugar jumped to 222 on arrival.  In the ER however his blood sugar has continued to drop and not being maintained above 40-50.  Patient reported apparently taking insulin but not sure of the dose.  With his noncompliance not sure of what he took and when he took it.  He is awake now however blood sugar is still borderline below 60.  It responds to IV dextrose.  Patient is being admitted to the hospital for prolonged hypoglycemia.  He has history of polysubstance abuse including marijuana use.  Denied taking any IV drugs tonight.  He has no fever no chills.  No nausea vomiting or diarrhea..  Interim history Patient admitted for hypoglycemia. Unfortunately, blood sugars have been very labile. Assessment & Plan   Persistent hypoglycemia/uncontrolled diabetes mellitus -?  Unknown cause, possibly inadvertent insulin overdose -Continue to monitor CBGs every 1-2 hours -Discontinued D10 infusion -will change diet to carb modified  -last hemoglobin A1c 10.1 on 01/30/2018; Repeat hemoglobin A1c 9.8 -Gust with diabetes coordinator, will increase NovoLog 70/30 to 15 units twice daily with a custom insulin sliding scale. -Continue to monitor CBGs closely  Diabetic neuropathy -On review of patient's chart he was on  gabapentin at one point in time.  Did start patient on gabapentin 300 mg 3 times daily, will increase to 400 mg. -Patient states he has tried Lyrica in the past which is work for him however he could not afford it.  Chronic kidney disease, stage III -Creatinine stable  Tobacco abuse -Smoking cessation counseling provided, continue nicotine patch  Essential hypertension  -Controlled, continue amlodipine  Chronic hepatitis C -Followed by infectious disease  COPD -Currently stable, not in acute exacerbation  Leukocytosis -Resolved, likely reactive  Polysubstance abuse -urine tox screen +cocaine and THC -discussed cessation   Dysphagia -Speech therapy was consulted, Regular diet with thin liquids recommended -Upon review of patient's chart he has had issues with this in the past -Per review of patient's home medications, he was taking a PPI at one point time however discontinued -Restarted PPI -Also has poor dentition  DVT Prophylaxis Lovenox  Code Status: Full  Family Communication: None at bedside  Disposition Plan: Admitted.  Pending improvement in blood sugars and stabilization.  Suspect home on discharge- possibly within 24 hours  Consultants None  Procedures  None  Antibiotics   Anti-infectives (From admission, onward)   None      Subjective:   [name] seen and examined today.  Patient complains of pain especially neuropathic type pain.  He states Lyrica is helped him in the past.  However he was unable to afford it.  Currently denies chest pain, shortness of breath, abdominal pain, nausea or vomiting, diarrhea or constipation, dizziness or headache.  Hoping to go home soon. Objective:   Vitals:   08/13/18  1275 08/13/18 1344 08/13/18 1946 08/14/18 0408  BP: (!) 164/76 (!) 160/70 (!) 169/74 134/64  Pulse: 81 79 79 75  Resp: 16 20 16 12   Temp: 99.3 F (37.4 C) 98.1 F (36.7 C) 98.6 F (37 C) 99 F (37.2 C)  TempSrc: Oral Oral Oral Oral  SpO2: 96%  99% 98% 94%  Weight:      Height:        Intake/Output Summary (Last 24 hours) at 08/14/2018 0919 Last data filed at 08/14/2018 0600 Gross per 24 hour  Intake 1302 ml  Output 250 ml  Net 1052 ml   Filed Weights   08/12/18 0400  Weight: 64.7 kg   Exam  General: Well developed, chronically ill-appearing, NAD  HEENT: NCAT, mucous membranes moist.  Poor dentition  Neck: Supple  Cardiovascular: S1 S2 auscultated, RRR  Respiratory: Clear to auscultation bilaterally   Abdomen: Soft, nontender, nondistended, + bowel sounds  Extremities: warm dry without cyanosis clubbing or edema  Neuro: AAOx3, nonfocal  Psych: Appropriate mood and affect   Data Reviewed: I have personally reviewed following labs and imaging studies  CBC: Recent Labs  Lab 08/11/18 2034 08/12/18 0428 08/13/18 0453  WBC 14.6* 14.1* 8.4  NEUTROABS 12.3*  --   --   HGB 11.7* 11.7* 10.9*  HCT 35.2* 36.1* 33.4*  MCV 89.1 90.3 90.0  PLT 408* 466* 170*   Basic Metabolic Panel: Recent Labs  Lab 08/11/18 2034 08/12/18 0115 08/12/18 0428 08/13/18 0453  NA 135  --  137 134*  K 5.8* 4.4 4.7 4.9  CL 104  --  102 103  CO2 25  --  27 25  GLUCOSE 156*  --  65* 327*  BUN 31*  --  31* 32*  CREATININE 1.44*  --  1.40* 1.40*  CALCIUM 8.9  --  8.9 8.5*  MG 2.5*  --   --   --    GFR: Estimated Creatinine Clearance: 50.7 mL/min (A) (by C-G formula based on SCr of 1.4 mg/dL (H)). Liver Function Tests: Recent Labs  Lab 08/12/18 0428  AST 21  ALT 18  ALKPHOS 105  BILITOT 0.4  PROT 6.4*  ALBUMIN 3.0*   No results for input(s): LIPASE, AMYLASE in the last 168 hours. No results for input(s): AMMONIA in the last 168 hours. Coagulation Profile: No results for input(s): INR, PROTIME in the last 168 hours. Cardiac Enzymes: No results for input(s): CKTOTAL, CKMB, CKMBINDEX, TROPONINI in the last 168 hours. BNP (last 3 results) No results for input(s): PROBNP in the last 8760 hours. HbA1C: Recent Labs     08/13/18 0453  HGBA1C 9.8*   CBG: Recent Labs  Lab 08/13/18 1626 08/13/18 1944 08/13/18 2356 08/14/18 0406 08/14/18 0820  GLUCAP 92 132* 140* 312* 355*   Lipid Profile: No results for input(s): CHOL, HDL, LDLCALC, TRIG, CHOLHDL, LDLDIRECT in the last 72 hours. Thyroid Function Tests: No results for input(s): TSH, T4TOTAL, FREET4, T3FREE, THYROIDAB in the last 72 hours. Anemia Panel: No results for input(s): VITAMINB12, FOLATE, FERRITIN, TIBC, IRON, RETICCTPCT in the last 72 hours. Urine analysis:    Component Value Date/Time   COLORURINE YELLOW 08/11/2018 2034   APPEARANCEUR CLEAR 08/11/2018 2034   LABSPEC 1.013 08/11/2018 2034   PHURINE 5.0 08/11/2018 2034   GLUCOSEU >=500 (A) 08/11/2018 2034   GLUCOSEU 500 04/28/2012 1149   HGBUR NEGATIVE 08/11/2018 2034   BILIRUBINUR NEGATIVE 08/11/2018 2034   BILIRUBINUR neg 04/28/2012 [employer] 08/11/2018 2034   PROTEINUR  100 (A) 08/11/2018 2034   UROBILINOGEN 4.0 (H) 11/16/2013 1646   NITRITE NEGATIVE 08/11/2018 2034   LEUKOCYTESUR NEGATIVE 08/11/2018 2034   Sepsis Labs: @LABRCNTIP (procalcitonin:4,lacticidven:4)  )No results found for this or any previous visit (from the past 240 hour(s)).    Radiology Studies: Dg Chest 2 View  Result Date: 08/12/2018 CLINICAL DATA:  Left rib pain after fall. EXAM: CHEST - 2 VIEW COMPARISON:  Radiograph of June 15, 2018. FINDINGS: The heart size and mediastinal contours are within normal limits. Both lungs are clear. No pneumothorax or pleural effusion is noted. Old left rib fractures are noted. IMPRESSION: No active cardiopulmonary disease. Electronically Signed   By: Marijo Conception M.D.   On: 08/12/2018 09:27   Dg Foot Complete Right  Result Date: 08/12/2018 CLINICAL DATA:  Toe pain after fall. EXAM: RIGHT FOOT COMPLETE - 3+ VIEW COMPARISON:  Radiographs of April 19, 2017 and July 27, 2016. FINDINGS: Status post amputation of first toe. Vascular calcifications are noted.  There is noted posterior dislocation of the second middle phalanx relative to the second proximal phalanx. No definite fracture is noted. Linear metallic density is seen in the plantar soft tissues posteriorly underneath the calcaneus consistent with foreign body which was present on prior exam. IMPRESSION: Posterior dislocation of second middle phalanx relative to the second proximal phalanx. No definite fracture is noted. Linear metallic foreign body is seen in plantar soft tissues underneath the calcaneus which was present on prior exam. Electronically Signed   By: Marijo Conception M.D.   On: 08/12/2018 09:30     Scheduled Meds: . amLODipine  5 mg Oral Daily  . enoxaparin (LOVENOX) injection  40 mg Subcutaneous Daily  . feeding supplement (ENSURE ENLIVE)  237 mL Oral BID BM  . gabapentin  400 mg Oral TID  . insulin aspart  0-9 Units Subcutaneous TID WC  . insulin aspart protamine- aspart  10 Units Subcutaneous BID WC  . nicotine  21 mg Transdermal Daily  . pantoprazole  40 mg Oral Daily  . sodium chloride flush  3 mL Intravenous Q12H   Continuous Infusions: . sodium chloride       LOS: 2 days   Time Spent in minutes   30 minutes  Dalton Mille D.O. on 08/14/2018 at 9:19 AM  Between 7am to 7pm - Please see pager noted on [email or web address]  After 7pm go to [email or web address]  And look for the night coverage person covering for me after hours  [facility] Office  [phone]

## 2018-08-14 NOTE — Progress Notes (Signed)
Occupational Therapy Treatment Patient Details Name: [name] MRN: [id number] DOB: 04/04/1956 Today's Date: 08/14/2018    History of present illness Pt was admitted for low blood sugar. PMH:  DM type 1, noncompliance, HTN, chronic Hep C, polysubstance abuse and depression   OT comments  Pt continues to need safety cues with RW; he moves quickly.  Decreased memory during session.     Follow Up Recommendations  Supervision/Assistance - 24 hour    Equipment Recommendations  None recommended by OT    Recommendations for Other Services      Precautions / Restrictions Precautions Precautions: Fall Restrictions Weight Bearing Restrictions: No       Mobility Bed Mobility Overal bed mobility: Modified Independent                Transfers   Equipment used: Rolling walker (2 wheeled)   Sit to Stand: Supervision         General transfer comment: cues for safety    Balance                                           ADL either performed or assessed with clinical judgement   ADL       Grooming: Oral care;Wash/dry hands;Wash/dry face;Standing;Min guard                                 General ADL Comments: pt with decreased walker safety:  min guard for safety.  Pt did not want to perform bathing:  wants to take a shower.  Left message in sticky notes asking MD if he can.  Multiple cues for safety with RW. Pt moves quickly and abandons walker when he gets close to destination     Vision       Perception     Praxis      Cognition Arousal/Alertness: Awake/alert Behavior During Therapy: WFL for tasks assessed/performed Overall Cognitive Status: No family/caregiver present to determine baseline cognitive functioning                                 General Comments: decreased safety and memory        Exercises     Shoulder Instructions       General Comments      Pertinent Vitals/ Pain       Pain  Assessment: Faces Faces Pain Scale: Hurts a little bit Pain Location: bil feet Pain Descriptors / Indicators: (neuropathy) Pain Intervention(s): Limited activity within patient's tolerance;Monitored during session;Repositioned  Home Living                                          Prior Functioning/Environment              Frequency  Min 2X/week        Progress Toward Goals  OT Goals(current goals can now be found in the care plan section)  Progress towards OT goals: Progressing toward goals     Plan      Co-evaluation                 AM-PAC OT "6 Clicks" Daily Activity  Outcome Measure   Help from another person eating meals?: None Help from another person taking care of personal grooming?: A Little Help from another person toileting, which includes using toliet, bedpan, or urinal?: A Little Help from another person bathing (including washing, rinsing, drying)?: A Little Help from another person to put on and taking off regular upper body clothing?: A Little Help from another person to put on and taking off regular lower body clothing?: A Little 6 Click Score: 19    End of Session    OT Visit Diagnosis: Muscle weakness (generalized) (M62.81);Unsteadiness on feet (R26.81)   Activity Tolerance Patient tolerated treatment well   Patient Left in bed;with call bell/phone within reach;with chair alarm set   Nurse Communication          Time: 7116-5790 OT Time Calculation (min): 14 min  Charges: OT General Charges $OT Visit: 1 Visit OT Treatments $Self Care/Home Management : 8-22 mins  Lesle Chris, OTR/L Acute Rehabilitation Services [phone] [place] pager [phone] office 08/14/2018   [place] 08/14/2018, 9:27 AM

## 2018-08-15 LAB — BASIC METABOLIC PANEL
Anion gap: 7 (ref 5–15)
BUN: 44 mg/dL — ABNORMAL HIGH (ref 8–23)
CO2: 27 mmol/L (ref 22–32)
Calcium: 8.7 mg/dL — ABNORMAL LOW (ref 8.9–10.3)
Chloride: 103 mmol/L (ref 98–111)
Creatinine, Ser: 1.56 mg/dL — ABNORMAL HIGH (ref 0.61–1.24)
GFR calc Af Amer: 55 mL/min — ABNORMAL LOW (ref >60)
GFR calc non Af Amer: 47 mL/min — ABNORMAL LOW (ref >60)
Glucose, Bld: 254 mg/dL — ABNORMAL HIGH (ref 70–99)
Potassium: 4.6 mmol/L (ref 3.5–5.1)
Sodium: 137 mmol/L (ref 135–145)

## 2018-08-15 LAB — HEMOGLOBIN AND HEMATOCRIT, BLOOD
HCT: 33 % — ABNORMAL LOW (ref 39.0–52.0)
Hemoglobin: 10.6 g/dL — ABNORMAL LOW (ref 13.0–17.0)

## 2018-08-15 LAB — GLUCOSE, CAPILLARY
Glucose-Capillary: 262 mg/dL — ABNORMAL HIGH (ref 70–99)
Glucose-Capillary: 293 mg/dL — ABNORMAL HIGH (ref 70–99)
Glucose-Capillary: 47 mg/dL — ABNORMAL LOW (ref 70–99)
Glucose-Capillary: 99 mg/dL (ref 70–99)

## 2018-08-15 MED ORDER — BLOOD GLUCOSE MONITOR KIT: PACK | 0 refills | Status: AC

## 2018-08-15 MED ORDER — AMLODIPINE BESYLATE 10 MG PO TABS: 10.0000 mg | ORAL_TABLET | Freq: Every day | ORAL | 0 refills | Status: DC

## 2018-08-15 MED ORDER — INSULIN ASPART 100 UNIT/ML ~~LOC~~ SOLN: SUBCUTANEOUS | 11 refills | Status: DC

## 2018-08-15 MED ORDER — ENSURE ENLIVE PO LIQD: 1.0000 | Freq: Two times a day (BID) | ORAL | 0 refills | Status: DC

## 2018-08-15 MED ORDER — GABAPENTIN 400 MG PO CAPS: 400.0000 mg | ORAL_CAPSULE | Freq: Three times a day (TID) | ORAL | 0 refills | Status: DC

## 2018-08-15 MED ORDER — INSULIN PEN NEEDLE 31G X 5 MM MISC: 0 refills | Status: DC

## 2018-08-15 MED ORDER — LANCETS MISC: 1.0000 | Freq: Every day | 0 refills | Status: DC | PRN

## 2018-08-15 MED ORDER — PANTOPRAZOLE SODIUM 40 MG PO TBEC: 40.0000 mg | DELAYED_RELEASE_TABLET | Freq: Every day | ORAL | 0 refills | Status: DC

## 2018-08-15 MED ORDER — NICOTINE 21 MG/24HR TD PT24: 21.0000 mg | MEDICATED_PATCH | Freq: Every day | TRANSDERMAL | 0 refills | Status: DC

## 2018-08-15 MED ORDER — INSULIN ASPART PROT & ASPART (70-30 MIX) 100 UNIT/ML PEN: PEN_INJECTOR | SUBCUTANEOUS | 1 refills | Status: DC

## 2018-08-15 NOTE — Plan of Care (Signed)
Discharge instructions reviewed with patient, questions answered verbalized understanding.  Patient instructed to take 70/30 insulin 15 units in the a.m. and 10 in the p.m. Script given for blood glucose monitoring machine and supplies.  Patient also on sliding scale but I did review with patient he would have to have his machine and supplies and know what his sugar is before taking any sliding scale insulin.  Patient verbalized understanding of above.  Patient transported via wheelchair to main entrance to be taken home by son.

## 2018-08-15 NOTE — Progress Notes (Signed)
Hypoglycemic Event  CBG: 41  Treatment: 8 oz juice/soda  Symptoms: Hungry  Follow-up CBG: Time: 0104 CBG Result: 99  Possible Reasons for Event: medication regimen   Comments/MD notified: standing order initiated     [name]

## 2018-08-15 NOTE — Progress Notes (Signed)
Pt c/o about IV in his left arm and wanted it out. He pulled it out whiles getting out of bed to the toilet and has refused to have another IV inserted.

## 2018-08-15 NOTE — Discharge Summary (Signed)
Physician Discharge Summary  [name] CNO:[id number] DOB: 1955-11-26 DOA: 08/11/2018  PCP: Patient, No Pcp Per  Admit date: 08/11/2018 Discharge date: 08/15/2018  Time spent: 45 minutes  Recommendations for Outpatient Follow-up:  Patient will be discharged to home.  Patient will need to follow up with primary care provider within one week of discharge.  Follow up with endocrinology, Dr. Loanne Drilling.  Patient should continue medications as prescribed.  Patient should follow a carb modified diet.   Discharge Diagnoses:  Persistent hypoglycemia/uncontrolled diabetes mellitus Diabetic neuropathy Chronic kidney disease, stage III Tobacco abuse Essential hypertension  Chronic hepatitis C COPD Leukocytosis Polysubstance abuse Normocytic anemia Dysphagia  Discharge Condition: Stable  Diet recommendation: carb modified  Filed Weights   08/12/18 0400  Weight: 64.7 kg    History of present illness:  On 08/12/2018 by Dr. Josephina Shih a [name] medical history significant oftype I diabetes with poor control, hypertension, chronic hepatitis C, vitiligo, polysubstance abuse, depression, medication noncompliance, hyperlipidemia who presented to the ER brought in by EMS secondary to blood sugar of 31. Patient refused EMS transport initially. They were called at 4:00 and then at 7:00 when the sugar was too low. He arrived the ER sweaty lethargic and not following commands. Patient initially received dextrose bolus and D10 glucose. Blood sugar jumped to 222 on arrival. In the ER however his blood sugar has continued to drop and not being maintained above 40-50. Patient reported apparently taking insulin but not sure of the dose. With his noncompliance not sure of what he took and when he took it. He is awake now however blood sugar is still borderline below 60. It responds to IV dextrose. Patient is being admitted to the hospital for prolonged hypoglycemia. He has  history of polysubstance abuse including marijuana use. Denied taking any IV drugs tonight. He has no fever no chills. No nausea vomiting or diarrhea.[name]  Hospital Course:  Persistent hypoglycemia/uncontrolled diabetes mellitus -?  Unknown cause, possibly inadvertent insulin overdose -Continue to monitor CBGs every 1-2 hours -Discontinued D10 infusion -last hemoglobin A1c 10.1 on 01/30/2018; Repeat hemoglobin A1c 9.8 -Discussed with diabetes coordinator, increased NovoLog 70/30 to 15 units twice daily with a custom insulin sliding scale.  -Patient did have slight drop in hemoglobin overnight, which she states is been occurring at home. -Will discharge patient with NovoLog 70/30 15 units every morning, 10 units every afternoon -Discussed close follow-up with patient and his endocrinologist, Dr. Loanne Drilling.  Diabetic neuropathy -On review of patient's chart he was on gabapentin at one point in time.   -Continue gabapentin 4 mg 3 times daily -Patient states he has tried Lyrica in the past which is work for him however he could not afford it.  Chronic kidney disease, stage III -Creatinine stable  Tobacco abuse -Smoking cessation counseling provided, continue nicotine patch  Essential hypertension  -Controlled, continue amlodipine  Chronic hepatitis C -Followed by infectious disease  COPD -Currently stable, not in acute exacerbation  Leukocytosis -Resolved, likely reactive  Polysubstance abuse -urine tox screen +cocaine and THC -discussed cessation   Normocytic anemia -Hemoglobin stable, baseline has ranged from 9-10, occasionally 11 since 2018  Dysphagia -Speech therapy was consulted, Regular diet with thin liquids recommended -Upon review of patient's chart he has had issues with this in the past -Per review of patient's home medications, he was taking a PPI at one point time however discontinued -Restarted PPI -Also has poor  dentition  Procedures: None  Consultations: None  Discharge Exam: Vitals:  08/14/18 2008 08/15/18 0435  BP: (!) 162/69 (!) 166/74  Pulse: 76 77  Resp: 20 20  Temp: 98.3 F (36.8 C) 98 F (36.7 C)  SpO2: 100% 98%   Patient states he is feeling better and would like to go home.  He does complain of neuropathic pain but states the gabapentin has been helping.   General: Well developed, chronically ill-appearing, NAD  HEENT: NCAT, mucous membranes moist.  Poor dentition  Cardiovascular: S1 S2 auscultated, RRR,   Respiratory: Clear to auscultation bilaterally  Abdomen: Soft, nontender, nondistended, + bowel sounds  Extremities: warm dry without cyanosis clubbing or edema  Neuro: AAOx3, nonfocal  Psych: Does not, appropriate mood and affect  Discharge Instructions Discharge Instructions    Discharge instructions   Complete by:  As directed    Patient will be discharged to home.  Patient will need to follow up with primary care provider within one week of discharge.  Follow up with endocrinology, Dr. Loanne Drilling.  Patient should continue medications as prescribed.  Patient should follow a carb modified diet.     Allergies as of 08/15/2018      Reactions   Codeine Itching   Tolerates hydrocodone/apap      Medication List    STOP taking these medications   aspirin 81 MG chewable tablet   benzonatate 200 MG capsule Commonly known as:  TESSALON   hydrALAZINE 25 MG tablet Commonly known as:  APRESOLINE   hydrocortisone 2.5 % rectal cream Commonly known as:  Anusol-HC   insulin glargine 100 UNIT/ML injection Commonly known as:  LANTUS   metoCLOPramide 5 MG tablet Commonly known as:  Reglan     TAKE these medications   amLODipine 10 MG tablet Commonly known as:  NORVASC Take 1 tablet (10 mg total) by mouth daily for 30 days.   blood glucose meter kit and supplies Kit Dispense based on patient and insurance preference. Use up to four times daily as directed.  (FOR ICD-9 250.00, 250.01).   feeding supplement (ENSURE ENLIVE) Liqd Take 237 mLs by mouth 2 (two) times daily between meals for 30 days.   gabapentin 400 MG capsule Commonly known as:  NEURONTIN Take 1 capsule (400 mg total) by mouth 3 (three) times daily for 30 days. What changed:    medication strength  how much to take  when to take this   insulin aspart 100 UNIT/ML injection Commonly known as:  novoLOG CBG 121 - 150 (dose in units): 0; CBG 151 - 200 (dose in units): 1; CBG 201 - 250 (dose in units): 2; CBG 251 - 300 (dose in units): 3; CBG 301 - 350 (dose in units): 4; CBG 351 - 400 (dose in units): 5 What changed:    additional instructions  Another medication with the same name was removed. Continue taking this medication, and follow the directions you see here.   insulin aspart protamine - aspart (70-30) 100 UNIT/ML FlexPen Commonly known as:  NOVOLOG 70/30 MIX Inject 15 units every morning and 10 units every evening.   Insulin Pen Needle 31G X 5 MM Misc Use with novolog flex pen   Isopropyl Alcohol Wipes 70 % Misc Apply 1 each topically daily as needed.   Lancets Misc 1 each by Does not apply route daily as needed.   nicotine 21 mg/24hr patch Commonly known as:  NICODERM CQ - dosed in mg/24 hours Place 1 patch (21 mg total) onto the skin daily. Start taking on:  Aug 16, 2018  pantoprazole 40 MG tablet Commonly known as:  PROTONIX Take 1 tablet (40 mg total) by mouth daily at 6 (six) AM.      Allergies  Allergen Reactions   Codeine Itching    Tolerates hydrocodone/apap   Follow-up Information    [facility].   Specialty:  Internal Medicine Why:  call for a hospital follow up appointment Contact information: [place] [id number] [place] [address] [phone]       Renato Shin, MD. Schedule an appointment as soon as possible for a visit in 1 week(s).   Specialty:  Endocrinology Why:   Hospital follow up   Contact information: [address] [place] [address] [phone]        Serafina Mitchell, MD. Schedule an appointment as soon as possible for a visit in 1 week(s).   Specialties:  Vascular Surgery, Cardiology Why:  Hospital follow up Contact information: [place] [address] [phone]            The results of significant diagnostics from this hospitalization (including imaging, microbiology, ancillary and laboratory) are listed below for reference.    Significant Diagnostic Studies: Dg Chest 2 View  Result Date: 08/12/2018 CLINICAL DATA:  Left rib pain after fall. EXAM: CHEST - 2 VIEW COMPARISON:  Radiograph of June 15, 2018. FINDINGS: The heart size and mediastinal contours are within normal limits. Both lungs are clear. No pneumothorax or pleural effusion is noted. Old left rib fractures are noted. IMPRESSION: No active cardiopulmonary disease. Electronically Signed   By: Marijo Conception M.D.   On: 08/12/2018 09:27   Dg Foot Complete Right  Result Date: 08/12/2018 CLINICAL DATA:  Toe pain after fall. EXAM: RIGHT FOOT COMPLETE - 3+ VIEW COMPARISON:  Radiographs of April 19, 2017 and July 27, 2016. FINDINGS: Status post amputation of first toe. Vascular calcifications are noted. There is noted posterior dislocation of the second middle phalanx relative to the second proximal phalanx. No definite fracture is noted. Linear metallic density is seen in the plantar soft tissues posteriorly underneath the calcaneus consistent with foreign body which was present on prior exam. IMPRESSION: Posterior dislocation of second middle phalanx relative to the second proximal phalanx. No definite fracture is noted. Linear metallic foreign body is seen in plantar soft tissues underneath the calcaneus which was present on prior exam. Electronically Signed   By: Marijo Conception M.D.   On: 08/12/2018 09:30    Microbiology: No results found  for this or any previous visit (from the past 240 hour(s)).   Labs: Basic Metabolic Panel: Recent Labs  Lab 08/11/18 2034 08/12/18 0115 08/12/18 0428 08/13/18 0453 08/15/18 0401  NA 135  --  137 134* 137  K 5.8* 4.4 4.7 4.9 4.6  CL 104  --  102 103 103  CO2 25  --  _0 GLUCOSE 156*  --  65* 327* 254*  BUN 31*  --  31* 32* 44*  CREATININE 1.44*  --  1.40* 1.40* 1.56*  CALCIUM 8.9  --  8.9 8.5* 8.7*  MG 2.5*  --   --   --   --    Liver Function Tests: Recent Labs  Lab 08/12/18 0428  AST 21  ALT 18  ALKPHOS 105  BILITOT 0.4  PROT 6.4*  ALBUMIN 3.0*   No results for input(s): LIPASE, AMYLASE in the last 168 hours. No results for input(s): AMMONIA  in the last 168 hours. CBC: Recent Labs  Lab 08/11/18 2034 08/12/18 0428 08/13/18 0453 08/15/18 0401  WBC 14.6* 14.1* 8.4  --   NEUTROABS 12.3*  --   --   --   HGB 11.7* 11.7* 10.9* 10.6*  HCT 35.2* 36.1* 33.4* 33.0*  MCV 89.1 90.3 90.0  --   PLT 408* 466* 436*  --    Cardiac Enzymes: No results for input(s): CKTOTAL, CKMB, CKMBINDEX, TROPONINI in the last 168 hours. BNP: BNP (last 3 results) No results for input(s): BNP in the last 8760 hours.  ProBNP (last 3 results) No results for input(s): PROBNP in the last 8760 hours.  CBG: Recent Labs  Lab 08/14/18 2003 08/15/18 0025 08/15/18 0104 08/15/18 0426 08/15/18 0742  GLUCAP 241* 47* 99 262* 293*       Signed:  Creedence Kunesh  [name] 08/15/2018, 9:59 AM

## 2018-08-15 NOTE — Care Management Important Message (Signed)
Important Message  Patient Details  Name: [name] MRN: [id number] Date of Birth: 08/09/1955   Medicare Important Message Given:  Yes    Erenest Rasher, RN 08/15/2018, 11:32 AM

## 2018-08-15 NOTE — Discharge Instructions (Signed)
Hypoglycemia Hypoglycemia is when the sugar (glucose) level in your blood is too low. Signs of low blood sugar may include:  Feeling: ? Hungry. ? Worried or nervous (anxious). ? Sweaty and clammy. ? Confused. ? Dizzy. ? Sleepy. ? Sick to your stomach (nauseous).  Having: ? A fast heartbeat. ? A headache. ? A change in your vision. ? Tingling or no feeling (numbness) around your mouth, lips, or tongue. ? Jerky movements that you cannot control (seizure).  Having trouble with: ? Moving (coordination). ? Sleeping. ? Passing out (fainting). ? Getting upset easily (irritability). Low blood sugar can happen to people who have diabetes and people who do not have diabetes. Low blood sugar can happen quickly, and it can be an emergency. Treating low blood sugar Low blood sugar is often treated by eating or drinking something sugary right away, such as:  Fruit juice, 4-6 oz (120-150 mL).  Regular soda (not diet soda), 4-6 oz (120-150 mL).  Low-fat milk, 4 oz (120 mL).  Several pieces of hard candy.  Sugar or honey, 1 Tbsp (15 mL). Treating low blood sugar if you have diabetes If you can think clearly and swallow safely, follow the 15:15 rule:  Take 15 grams of a fast-acting carb (carbohydrate). Talk with your doctor about how much you should take.  Always keep a source of fast-acting carb with you, such as: ? Sugar tablets (glucose pills). Take 3-4 pills. ? 6-8 pieces of hard candy. ? 4-6 oz (120-150 mL) of fruit juice. ? 4-6 oz (120-150 mL) of regular (not diet) soda. ? 1 Tbsp (15 mL) honey or sugar.  Check your blood sugar 15 minutes after you take the carb.  If your blood sugar is still at or below 70 mg/dL (3.9 mmol/L), take 15 grams of a carb again.  If your blood sugar does not go above 70 mg/dL (3.9 mmol/L) after 3 tries, get help right away.  After your blood sugar goes back to normal, eat a meal or a snack within 1 hour.  Treating very low blood sugar If your  blood sugar is at or below 54 mg/dL (3 mmol/L), you have very low blood sugar (severe hypoglycemia). This may also cause:  Passing out.  Jerky movements you cannot control (seizure).  Losing consciousness (coma). This is an emergency. Do not wait to see if the symptoms will go away. Get medical help right away. Call your local emergency services (911 in the [place] Do not drive yourself to the hospital. If you have very low blood sugar and you cannot eat or drink, you may need a glucagon shot (injection). A family member or friend should learn how to check your blood sugar and how to give you a glucagon shot. Ask your doctor if you need to have a glucagon shot kit at home. Follow these instructions at home: General instructions  Take over-the-counter and prescription medicines only as told by your doctor.  Stay aware of your blood sugar as told by your doctor.  Limit alcohol intake to no more than 1 drink a day for nonpregnant women and 2 drinks a day for men. One drink equals 12 oz of beer (355 mL), 5 oz of wine (148 mL), or 1 oz of hard liquor (44 mL).  Keep all follow-up visits as told by your doctor. This is important. If you have diabetes:   Follow your diabetes care plan as told by your doctor. Make sure you: ? Know the signs of low blood sugar. ?  Take your medicines as told. ? Follow your exercise and meal plan. ? Eat on time. Do not skip meals. ? Check your blood sugar as often as told by your doctor. Always check it before and after exercise. ? Follow your sick day plan when you cannot eat or drink normally. Make this plan ahead of time with your doctor.  Share your diabetes care plan with: ? Your work or school. ? People you live with.  Check your pee (urine) for ketones: ? When you are sick. ? As told by your doctor.  Carry a card or wear jewelry that says you have diabetes. Contact a doctor if:  You have trouble keeping your blood sugar in your target  range.  You have low blood sugar often. Get help right away if:  You still have symptoms after you eat or drink something sugary.  Your blood sugar is at or below 54 mg/dL (3 mmol/L).  You have jerky movements that you cannot control.  You pass out. These symptoms may be an emergency. Do not wait to see if the symptoms will go away. Get medical help right away. Call your local emergency services (911 in the [place] Do not drive yourself to the hospital. Summary  Hypoglycemia happens when the level of sugar (glucose) in your blood is too low.  Low blood sugar can happen to people who have diabetes and people who do not have diabetes. Low blood sugar can happen quickly, and it can be an emergency.  Make sure you know the signs of low blood sugar and know how to treat it.  Always keep a source of sugar (fast-acting carb) with you to treat low blood sugar. This information is not intended to replace advice given to you by your health care provider. Make sure you discuss any questions you have with your health care provider. Document Released: 06/26/2009 Document Revised: 09/23/2017 Document Reviewed: 05/05/2015 Elsevier Interactive Patient Education  2019 [employer].

## 2018-08-18 ENCOUNTER — Other Ambulatory Visit: Payer: Self-pay

## 2018-08-18 ENCOUNTER — Encounter: Payer: Self-pay | Admitting: Endocrinology

## 2018-08-18 ENCOUNTER — Ambulatory Visit (INDEPENDENT_AMBULATORY_CARE_PROVIDER_SITE_OTHER): Payer: Medicare HMO | Admitting: Endocrinology

## 2018-08-18 VITALS — BP 150/74 | HR 77 | Temp 97.5°F | Wt 152.0 lb

## 2018-08-18 DIAGNOSIS — E1029 Type 1 diabetes mellitus with other diabetic kidney complication: Secondary | ICD-10-CM

## 2018-08-18 DIAGNOSIS — E1065 Type 1 diabetes mellitus with hyperglycemia: Secondary | ICD-10-CM | POA: Diagnosis not present

## 2018-08-18 DIAGNOSIS — IMO0002 Reserved for concepts with insufficient information to code with codable children: Secondary | ICD-10-CM

## 2018-08-18 MED ORDER — GLUCOSE BLOOD VI STRP: 1.0000 | ORAL_STRIP | Freq: Two times a day (BID) | 3 refills | Status: DC

## 2018-08-18 NOTE — Patient Instructions (Addendum)
Here is a new meter.  I have sent a prescription to your pharmacy, for strips. check your blood sugar twice a day.  vary the time of day when you check, between before the 3 meals, and at bedtime.  also check if you have symptoms of your blood sugar being too high or too low.  please keep a record of the readings and bring it to your next appointment here (or you can bring the meter itself).  You can write it on any piece of paper.  please call us sooner if your blood sugar goes below 70, or if you have a lot of readings over 200. Please continue the same insulin for now.  Please come back for a follow-up appointment in 1 week.

## 2018-08-18 NOTE — Progress Notes (Signed)
Subjective:    Patient ID: [name], male    DOB: September 17, 1955, 63 y.o.   MRN: [id number]  HPI Pt returns for f/u of diabetes mellitus: DM type: 1 Dx'ed: 6295 Complications: severe polyneuropathy of all 4's, nephropathy, toe amputation, PAD, and PDR.   Therapy: insulin since dx.   DKA: never Severe hypoglycemia: multiple episodes (last was in early 2020).   Pancreatitis: never Other: he has done better with a qd insulin regimen; he was changed from lantus to levemir, due to mild hypoglycemia in the early hours of the morning; therapy is limited by ongoing substance abuse; he says he cannot afford insulin analogs.  Interval history: Pt says he recently had another episode of severe hypoglycemia.  This happened at midnight.  He was change to 70/30 insulin, BID.  He does not check cbg's.  He is overdue for f/u.  He was homeless last year, but he now in a house with roommates.   Past Medical History:  Diagnosis Date  . DEPRESSION   . DIABETES MELLITUS, TYPE I   . DRUG ABUSE    pt should have NO controlled substances rx'ed  . Glaucoma   . HEPATITIS C    chronic  . Hypertension 02/18/2011  . 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 Vitiligo     Past Surgical History:  Procedure Laterality Date  . ABDOMINAL AORTOGRAM W/LOWER EXTREMITY N/A 07/25/2016   Procedure: Abdominal Aortogram w/Bilateral Lower Extremity Runoff;  Surgeon: Conrad Soddy-Daisy, MD;  Location: [facility];  Service: Cardiovascular;  Laterality: N/A;  . ABDOMINAL AORTOGRAM W/LOWER EXTREMITY N/A 12/24/2016   Procedure: ABDOMINAL AORTOGRAM W/LOWER EXTREMITY;  Surgeon: Serafina Mitchell, MD;  Location: [facility];  Service: Cardiovascular;  Laterality: N/A;  . AMPUTATION TOE Right 07/26/2016   Procedure: AMPUTATION GREAT TOE;  Surgeon: Serafina Mitchell, MD;  Location: [facility];  Service: Vascular;  Laterality: Right;  . EYE SURGERY     retinal surgery x 2, right eye  . INSERTION OF ILIAC STENT Right 07/26/2016   Procedure: INSERTION OF RIGHT POPLITEAL STENT WITH BALLOON ANGIOPLASTY;  Surgeon: Serafina Mitchell, MD;  Location: [facility];  Service: Vascular;  Laterality: Right;  . LOWER EXTREMITY ANGIOGRAM Right 07/26/2016   Procedure: LOWER EXTREMITY ANGIOGRAM;  Surgeon: Serafina Mitchell, MD;  Location: [facility];  Service: Vascular;  Laterality: Right;  . PERIPHERAL VASCULAR BALLOON ANGIOPLASTY Right 07/26/2016   Procedure: PERIPHERAL VASCULAR BALLOON ANGIOPLASTY  RIGHT ANTERIOR TIBEAL ARTERY AND RIGHT SUPERFICIAL FEMORAL ARTERY;  Surgeon: Serafina Mitchell, MD;  Location: [facility];  Service: Vascular;  Laterality: Right;  . PERIPHERAL VASCULAR INTERVENTION Right 12/24/2016   Procedure: PERIPHERAL VASCULAR INTERVENTION;  Surgeon: Serafina Mitchell, MD;  Location: [facility];  Service: Cardiovascular;  Laterality: Right;  lower extr    Social History   Socioeconomic History  . Marital status: Single    Spouse name: Not on file  . Number of children: 2  . Years of education: 62  . Highest education level: Associate degree: occupational, Hotel manager, or vocational program  Occupational History    Employer: UNEMPLOYED    Comment: disabled  Social Needs  . Financial resource strain: Very hard  . Food insecurity:    Worry: Often true    Inability: Often true  . Transportation needs:    Medical: Yes    Non-medical: Yes  Tobacco Use  . Smoking status: Current Every Day Smoker    Packs/day: 1.00  Years: 51.00    Pack years: 51.00    Types: Cigarettes  . Smokeless tobacco: Former Systems developer    Types: Chew    Quit date: 1985  Substance and Sexual Activity  . Alcohol use: No    Alcohol/week: 0.0 standard drinks    Comment: 08/14/14 none  . Drug use: Yes    Types: Marijuana, Heroin, Cocaine, "Crack" cocaine, Fentanyl, Methylphenidate    Comment: s/p rehab x 6, occasional drug use (per pt last  use was 06/2015)  . Sexual activity: Not Currently  Lifestyle  . Physical activity:    Days per week: 0 days     Minutes per session: 0 min  . Stress: Very much  Relationships  . Social connections:    Talks on phone: Never    Gets together: Once a week    Attends religious service: Never    Active member of club or organization: No    Attends meetings of clubs or organizations: Not on file    Relationship status: Never married  . Intimate partner violence:    Fear of current or ex partner: No    Emotionally abused: No    Physically abused: No    Forced sexual activity: No  Other Topics Concern  . Not on file  Social History Narrative   Lives in boarding house/homeless   Ongoing occ drug use-crack   Single, disabled, prev mental health drug counselor.     Current Outpatient Medications on File Prior to Visit  Medication Sig Dispense Refill  . amLODipine (NORVASC) 10 MG tablet Take 1 tablet (10 mg total) by mouth daily for 30 days. 30 tablet 0  . blood glucose meter kit and supplies KIT Dispense based on patient and insurance preference. Use up to four times daily as directed. (FOR ICD-9 250.00, 250.01). 1 each 0  . feeding supplement, ENSURE ENLIVE, (ENSURE ENLIVE) LIQD Take 237 mLs by mouth 2 (two) times daily between meals for 30 days. 60 Bottle 0  . gabapentin (NEURONTIN) 400 MG capsule Take 1 capsule (400 mg total) by mouth 3 (three) times daily for 30 days. 90 capsule 0  . insulin aspart protamine - aspart (NOVOLOG 70/30 MIX) (70-30) 100 UNIT/ML FlexPen Inject 15 units every morning and 10 units every evening. 15 mL 1  . Insulin Pen Needle 31G X 5 MM MISC Use with novolog flex pen 100 each 0  . Isopropyl Alcohol Wipes 70 % MISC Apply 1 each topically daily as needed. 270 each 0  . nicotine (NICODERM CQ - DOSED IN MG/24 HOURS) 21 mg/24hr patch Place 1 patch (21 mg total) onto the skin daily. 28 patch 0  . pantoprazole (PROTONIX) 40 MG tablet Take 1 tablet (40 mg total) by mouth daily at 6 (six) AM. 30 tablet 0   No current facility-administered medications on file prior to visit.      Allergies  Allergen Reactions  . Codeine Itching    Tolerates hydrocodone/apap    Family History  Problem Relation Age of Onset  . Diabetes Father   . Kidney disease Father   . Arthritis Mother   . Heart disease Mother        CAD    BP (!) 150/74 (BP Location: Right Arm, Patient Position: Sitting, Cuff Size: Normal) Comment: pt did not take BP meds this morning  Pulse 77   Temp (!) 97.5 F (36.4 C) (Oral)   Wt 152 lb (68.9 kg)   SpO2 94%   BMI 20.61  kg/m    Review of Systems He has lost 11 lbs since last ov.      Objective:   Physical Exam VITAL SIGNS:  See vs page GENERAL: no distress Pulses: dorsalis pedis intact bilat.   MSK: no deformity of the feet, except right 5th toe is absent CV: no leg edema Skin:  no ulcer on the feet.  normal color and temp on the feet. Neuro: sensation is intact to touch on the feet, but severely decreased from normal.  Ext: right great toe is amputated.    Lab Results  Component Value Date   CREATININE 1.56 (H) 08/15/2018   BUN 44 (H) 08/15/2018   NA 137 08/15/2018   K 4.6 08/15/2018   CL 103 08/15/2018   CO2 27 08/15/2018    Lab Results  Component Value Date   HGBA1C 9.8 (H) 08/13/2018       Assessment & Plan:  Type 1 DM, with PAD: worse. Renal failure: It is unlikely that he needs PM insulin dose.   Poor social situation: we discussed.  Pt says he is working to improve this.   Patient Instructions  Here is a new meter.  I have sent a prescription to your pharmacy, for strips. check your blood sugar twice a day.  vary the time of day when you check, between before the 3 meals, and at bedtime.  also check if you have symptoms of your blood sugar being too high or too low.  please keep a record of the readings and bring it to your next appointment here (or you can bring the meter itself).  You can write it on any piece of paper.  please call us sooner if your blood sugar goes below 70, or if you have a lot of readings over  200. Please continue the same insulin for now.  Please come back for a follow-up appointment in 1 week.

## 2018-08-19 ENCOUNTER — Other Ambulatory Visit: Payer: Self-pay

## 2018-08-19 DIAGNOSIS — E1010 Type 1 diabetes mellitus with ketoacidosis without coma: Secondary | ICD-10-CM

## 2018-08-19 MED ORDER — ACCU-CHEK SOFTCLIX LANCETS MISC: 12 refills | Status: DC

## 2018-08-19 MED ORDER — GLUCOSE BLOOD VI STRP: ORAL_STRIP | 12 refills | Status: DC

## 2018-08-19 MED ORDER — ACCU-CHEK AVIVA VI SOLN: 1.0000 | 0 refills | Status: AC | PRN

## 2018-08-19 MED ORDER — ACCU-CHEK AVIVA PLUS W/DEVICE KIT: 1.0000 | PACK | Freq: Two times a day (BID) | 0 refills | Status: AC

## 2018-08-23 ENCOUNTER — Inpatient Hospital Stay (HOSPITAL_COMMUNITY): Payer: Medicare HMO

## 2018-08-23 ENCOUNTER — Emergency Department (HOSPITAL_COMMUNITY): Payer: Medicare HMO

## 2018-08-23 ENCOUNTER — Encounter (HOSPITAL_COMMUNITY): Payer: Self-pay | Admitting: Acute Care

## 2018-08-23 ENCOUNTER — Inpatient Hospital Stay (HOSPITAL_COMMUNITY)
Admission: EM | Admit: 2018-08-23 | Discharge: 2018-09-10 | DRG: 871 | Disposition: A | Discharge status: Home Health | Payer: Medicare HMO | Attending: Internal Medicine | Admitting: Internal Medicine

## 2018-08-23 ENCOUNTER — Other Ambulatory Visit: Payer: Self-pay

## 2018-08-23 DIAGNOSIS — E1040 Type 1 diabetes mellitus with diabetic neuropathy, unspecified: Secondary | ICD-10-CM | POA: Diagnosis present

## 2018-08-23 DIAGNOSIS — I129 Hypertensive chronic kidney disease with stage 1 through stage 4 chronic kidney disease, or unspecified chronic kidney disease: Secondary | ICD-10-CM | POA: Diagnosis present

## 2018-08-23 DIAGNOSIS — E162 Hypoglycemia, unspecified: Secondary | ICD-10-CM | POA: Diagnosis not present

## 2018-08-23 DIAGNOSIS — K2211 Ulcer of esophagus with bleeding: Secondary | ICD-10-CM | POA: Diagnosis present

## 2018-08-23 DIAGNOSIS — K254 Chronic or unspecified gastric ulcer with hemorrhage: Secondary | ICD-10-CM | POA: Diagnosis present

## 2018-08-23 DIAGNOSIS — E1165 Type 2 diabetes mellitus with hyperglycemia: Secondary | ICD-10-CM | POA: Diagnosis not present

## 2018-08-23 DIAGNOSIS — T68XXXA Hypothermia, initial encounter: Secondary | ICD-10-CM | POA: Diagnosis not present

## 2018-08-23 DIAGNOSIS — B3781 Candidal esophagitis: Secondary | ICD-10-CM | POA: Diagnosis present

## 2018-08-23 DIAGNOSIS — E43 Unspecified severe protein-calorie malnutrition: Secondary | ICD-10-CM | POA: Diagnosis not present

## 2018-08-23 DIAGNOSIS — D62 Acute posthemorrhagic anemia: Secondary | ICD-10-CM | POA: Diagnosis present

## 2018-08-23 DIAGNOSIS — J9600 Acute respiratory failure, unspecified whether with hypoxia or hypercapnia: Secondary | ICD-10-CM

## 2018-08-23 DIAGNOSIS — Z89411 Acquired absence of right great toe: Secondary | ICD-10-CM

## 2018-08-23 DIAGNOSIS — F29 Unspecified psychosis not due to a substance or known physiological condition: Secondary | ICD-10-CM | POA: Diagnosis not present

## 2018-08-23 DIAGNOSIS — R1319 Other dysphagia: Secondary | ICD-10-CM | POA: Diagnosis present

## 2018-08-23 DIAGNOSIS — R05 Cough: Secondary | ICD-10-CM

## 2018-08-23 DIAGNOSIS — K2210 Ulcer of esophagus without bleeding: Secondary | ICD-10-CM | POA: Diagnosis not present

## 2018-08-23 DIAGNOSIS — E875 Hyperkalemia: Secondary | ICD-10-CM | POA: Diagnosis present

## 2018-08-23 DIAGNOSIS — R571 Hypovolemic shock: Secondary | ICD-10-CM | POA: Diagnosis not present

## 2018-08-23 DIAGNOSIS — J690 Pneumonitis due to inhalation of food and vomit: Secondary | ICD-10-CM | POA: Diagnosis present

## 2018-08-23 DIAGNOSIS — R4182 Altered mental status, unspecified: Secondary | ICD-10-CM | POA: Diagnosis not present

## 2018-08-23 DIAGNOSIS — K920 Hematemesis: Secondary | ICD-10-CM | POA: Diagnosis not present

## 2018-08-23 DIAGNOSIS — N184 Chronic kidney disease, stage 4 (severe): Secondary | ICD-10-CM | POA: Diagnosis present

## 2018-08-23 DIAGNOSIS — K7200 Acute and subacute hepatic failure without coma: Secondary | ICD-10-CM

## 2018-08-23 DIAGNOSIS — K3189 Other diseases of stomach and duodenum: Secondary | ICD-10-CM | POA: Diagnosis not present

## 2018-08-23 DIAGNOSIS — K7290 Hepatic failure, unspecified without coma: Secondary | ICD-10-CM | POA: Diagnosis not present

## 2018-08-23 DIAGNOSIS — Z95828 Presence of other vascular implants and grafts: Secondary | ICD-10-CM

## 2018-08-23 DIAGNOSIS — F1910 Other psychoactive substance abuse, uncomplicated: Secondary | ICD-10-CM | POA: Diagnosis present

## 2018-08-23 DIAGNOSIS — E1051 Type 1 diabetes mellitus with diabetic peripheral angiopathy without gangrene: Secondary | ICD-10-CM | POA: Diagnosis present

## 2018-08-23 DIAGNOSIS — A419 Sepsis, unspecified organism: Principal | ICD-10-CM | POA: Diagnosis present

## 2018-08-23 DIAGNOSIS — K922 Gastrointestinal hemorrhage, unspecified: Secondary | ICD-10-CM | POA: Diagnosis not present

## 2018-08-23 DIAGNOSIS — E1110 Type 2 diabetes mellitus with ketoacidosis without coma: Secondary | ICD-10-CM | POA: Diagnosis present

## 2018-08-23 DIAGNOSIS — E103599 Type 1 diabetes mellitus with proliferative diabetic retinopathy without macular edema, unspecified eye: Secondary | ICD-10-CM | POA: Diagnosis present

## 2018-08-23 DIAGNOSIS — I214 Non-ST elevation (NSTEMI) myocardial infarction: Secondary | ICD-10-CM | POA: Diagnosis present

## 2018-08-23 DIAGNOSIS — Z452 Encounter for adjustment and management of vascular access device: Secondary | ICD-10-CM | POA: Diagnosis not present

## 2018-08-23 DIAGNOSIS — R069 Unspecified abnormalities of breathing: Secondary | ICD-10-CM

## 2018-08-23 DIAGNOSIS — K228 Other specified diseases of esophagus: Secondary | ICD-10-CM | POA: Diagnosis present

## 2018-08-23 DIAGNOSIS — B182 Chronic viral hepatitis C: Secondary | ICD-10-CM | POA: Diagnosis present

## 2018-08-23 DIAGNOSIS — R059 Cough, unspecified: Secondary | ICD-10-CM

## 2018-08-23 DIAGNOSIS — E1011 Type 1 diabetes mellitus with ketoacidosis with coma: Secondary | ICD-10-CM

## 2018-08-23 DIAGNOSIS — D72829 Elevated white blood cell count, unspecified: Secondary | ICD-10-CM | POA: Diagnosis present

## 2018-08-23 DIAGNOSIS — Z6821 Body mass index (BMI) 21.0-21.9, adult: Secondary | ICD-10-CM

## 2018-08-23 DIAGNOSIS — Z9114 Patient's other noncompliance with medication regimen: Secondary | ICD-10-CM

## 2018-08-23 DIAGNOSIS — D1803 Hemangioma of intra-abdominal structures: Secondary | ICD-10-CM | POA: Diagnosis present

## 2018-08-23 DIAGNOSIS — K259 Gastric ulcer, unspecified as acute or chronic, without hemorrhage or perforation: Secondary | ICD-10-CM | POA: Diagnosis present

## 2018-08-23 DIAGNOSIS — E785 Hyperlipidemia, unspecified: Secondary | ICD-10-CM | POA: Diagnosis present

## 2018-08-23 DIAGNOSIS — Z4659 Encounter for fitting and adjustment of other gastrointestinal appliance and device: Secondary | ICD-10-CM

## 2018-08-23 DIAGNOSIS — D649 Anemia, unspecified: Secondary | ICD-10-CM | POA: Diagnosis not present

## 2018-08-23 DIAGNOSIS — J9601 Acute respiratory failure with hypoxia: Secondary | ICD-10-CM | POA: Diagnosis present

## 2018-08-23 DIAGNOSIS — H409 Unspecified glaucoma: Secondary | ICD-10-CM | POA: Diagnosis present

## 2018-08-23 DIAGNOSIS — B1920 Unspecified viral hepatitis C without hepatic coma: Secondary | ICD-10-CM | POA: Diagnosis present

## 2018-08-23 DIAGNOSIS — K250 Acute gastric ulcer with hemorrhage: Secondary | ICD-10-CM | POA: Diagnosis not present

## 2018-08-23 DIAGNOSIS — E870 Hyperosmolality and hypernatremia: Secondary | ICD-10-CM | POA: Diagnosis present

## 2018-08-23 DIAGNOSIS — R0600 Dyspnea, unspecified: Secondary | ICD-10-CM

## 2018-08-23 DIAGNOSIS — R443 Hallucinations, unspecified: Secondary | ICD-10-CM

## 2018-08-23 DIAGNOSIS — Z20828 Contact with and (suspected) exposure to other viral communicable diseases: Secondary | ICD-10-CM | POA: Diagnosis present

## 2018-08-23 DIAGNOSIS — E1022 Type 1 diabetes mellitus with diabetic chronic kidney disease: Secondary | ICD-10-CM | POA: Diagnosis present

## 2018-08-23 DIAGNOSIS — J181 Lobar pneumonia, unspecified organism: Secondary | ICD-10-CM | POA: Diagnosis not present

## 2018-08-23 DIAGNOSIS — Z8249 Family history of ischemic heart disease and other diseases of the circulatory system: Secondary | ICD-10-CM

## 2018-08-23 DIAGNOSIS — Z7151 Drug abuse counseling and surveillance of drug abuser: Secondary | ICD-10-CM

## 2018-08-23 DIAGNOSIS — K209 Esophagitis, unspecified: Secondary | ICD-10-CM | POA: Diagnosis not present

## 2018-08-23 DIAGNOSIS — IMO0002 Reserved for concepts with insufficient information to code with codable children: Secondary | ICD-10-CM

## 2018-08-23 DIAGNOSIS — J9690 Respiratory failure, unspecified, unspecified whether with hypoxia or hypercapnia: Secondary | ICD-10-CM | POA: Diagnosis not present

## 2018-08-23 DIAGNOSIS — Z79899 Other long term (current) drug therapy: Secondary | ICD-10-CM

## 2018-08-23 DIAGNOSIS — K219 Gastro-esophageal reflux disease without esophagitis: Secondary | ICD-10-CM

## 2018-08-23 DIAGNOSIS — Z794 Long term (current) use of insulin: Secondary | ICD-10-CM

## 2018-08-23 DIAGNOSIS — S199XXA Unspecified injury of neck, initial encounter: Secondary | ICD-10-CM | POA: Diagnosis not present

## 2018-08-23 DIAGNOSIS — R6521 Severe sepsis with septic shock: Secondary | ICD-10-CM | POA: Diagnosis present

## 2018-08-23 DIAGNOSIS — E161 Other hypoglycemia: Secondary | ICD-10-CM | POA: Diagnosis not present

## 2018-08-23 DIAGNOSIS — E10649 Type 1 diabetes mellitus with hypoglycemia without coma: Secondary | ICD-10-CM | POA: Diagnosis present

## 2018-08-23 DIAGNOSIS — G9341 Metabolic encephalopathy: Secondary | ICD-10-CM | POA: Diagnosis not present

## 2018-08-23 DIAGNOSIS — E876 Hypokalemia: Secondary | ICD-10-CM | POA: Diagnosis present

## 2018-08-23 DIAGNOSIS — R339 Retention of urine, unspecified: Secondary | ICD-10-CM | POA: Diagnosis present

## 2018-08-23 DIAGNOSIS — Z833 Family history of diabetes mellitus: Secondary | ICD-10-CM

## 2018-08-23 DIAGNOSIS — Z885 Allergy status to narcotic agent status: Secondary | ICD-10-CM

## 2018-08-23 DIAGNOSIS — E1010 Type 1 diabetes mellitus with ketoacidosis without coma: Secondary | ICD-10-CM | POA: Diagnosis not present

## 2018-08-23 DIAGNOSIS — D696 Thrombocytopenia, unspecified: Secondary | ICD-10-CM | POA: Diagnosis present

## 2018-08-23 DIAGNOSIS — N179 Acute kidney failure, unspecified: Secondary | ICD-10-CM | POA: Diagnosis not present

## 2018-08-23 DIAGNOSIS — E8809 Other disorders of plasma-protein metabolism, not elsewhere classified: Secondary | ICD-10-CM | POA: Diagnosis not present

## 2018-08-23 DIAGNOSIS — Z841 Family history of disorders of kidney and ureter: Secondary | ICD-10-CM

## 2018-08-23 DIAGNOSIS — J449 Chronic obstructive pulmonary disease, unspecified: Secondary | ICD-10-CM | POA: Diagnosis present

## 2018-08-23 DIAGNOSIS — E1029 Type 1 diabetes mellitus with other diabetic kidney complication: Secondary | ICD-10-CM

## 2018-08-23 DIAGNOSIS — Z4682 Encounter for fitting and adjustment of non-vascular catheter: Secondary | ICD-10-CM | POA: Diagnosis not present

## 2018-08-23 DIAGNOSIS — R636 Underweight: Secondary | ICD-10-CM | POA: Diagnosis present

## 2018-08-23 DIAGNOSIS — F17210 Nicotine dependence, cigarettes, uncomplicated: Secondary | ICD-10-CM | POA: Diagnosis present

## 2018-08-23 DIAGNOSIS — J90 Pleural effusion, not elsewhere classified: Secondary | ICD-10-CM | POA: Diagnosis not present

## 2018-08-23 DIAGNOSIS — R0902 Hypoxemia: Secondary | ICD-10-CM | POA: Diagnosis not present

## 2018-08-23 DIAGNOSIS — S0990XA Unspecified injury of head, initial encounter: Secondary | ICD-10-CM | POA: Diagnosis not present

## 2018-08-23 DIAGNOSIS — R404 Transient alteration of awareness: Secondary | ICD-10-CM | POA: Diagnosis not present

## 2018-08-23 DIAGNOSIS — K921 Melena: Secondary | ICD-10-CM | POA: Diagnosis not present

## 2018-08-23 DIAGNOSIS — I959 Hypotension, unspecified: Secondary | ICD-10-CM

## 2018-08-23 LAB — BASIC METABOLIC PANEL
Anion gap: 28 — ABNORMAL HIGH (ref 5–15)
Anion gap: 22 — ABNORMAL HIGH (ref 5–15)
BUN: 106 mg/dL — ABNORMAL HIGH (ref 8–23)
BUN: 100 mg/dL — ABNORMAL HIGH (ref 8–23)
CO2: 13 mmol/L — ABNORMAL LOW (ref 22–32)
CO2: 18 mmol/L — ABNORMAL LOW (ref 22–32)
Calcium: 7.9 mg/dL — ABNORMAL LOW (ref 8.9–10.3)
Calcium: 7.7 mg/dL — ABNORMAL LOW (ref 8.9–10.3)
Chloride: 103 mmol/L (ref 98–111)
Chloride: 108 mmol/L (ref 98–111)
Creatinine, Ser: 4.84 mg/dL — ABNORMAL HIGH (ref 0.61–1.24)
Creatinine, Ser: 4.46 mg/dL — ABNORMAL HIGH (ref 0.61–1.24)
GFR calc Af Amer: 14 mL/min — ABNORMAL LOW (ref >60)
GFR calc Af Amer: 15 mL/min — ABNORMAL LOW (ref >60)
GFR calc non Af Amer: 12 mL/min — ABNORMAL LOW (ref >60)
GFR calc non Af Amer: 13 mL/min — ABNORMAL LOW (ref >60)
Glucose, Bld: 724 mg/dL (ref 70–99)
Glucose, Bld: 437 mg/dL — ABNORMAL HIGH (ref 70–99)
Potassium: 3.6 mmol/L (ref 3.5–5.1)
Potassium: 3.6 mmol/L (ref 3.5–5.1)
Sodium: 144 mmol/L (ref 135–145)
Sodium: 148 mmol/L — ABNORMAL HIGH (ref 135–145)

## 2018-08-23 LAB — CBC WITH DIFFERENTIAL/PLATELET
Abs Immature Granulocytes: 1.42 10*3/uL — ABNORMAL HIGH (ref 0.00–0.07)
Abs Immature Granulocytes: 0.63 10*3/uL — ABNORMAL HIGH (ref 0.00–0.07)
Basophils Absolute: 0.2 10*3/uL — ABNORMAL HIGH (ref 0.0–0.1)
Basophils Absolute: 0.1 10*3/uL (ref 0.0–0.1)
Basophils Relative: 1 %
Basophils Relative: 0 %
Eosinophils Absolute: 0.1 10*3/uL (ref 0.0–0.5)
Eosinophils Absolute: 0 10*3/uL (ref 0.0–0.5)
Eosinophils Relative: 0 %
Eosinophils Relative: 0 %
HCT: 41.4 % (ref 39.0–52.0)
HCT: 34.7 % — ABNORMAL LOW (ref 39.0–52.0)
Hemoglobin: 11.3 g/dL — ABNORMAL LOW (ref 13.0–17.0)
Hemoglobin: 10.4 g/dL — ABNORMAL LOW (ref 13.0–17.0)
Immature Granulocytes: 5 %
Immature Granulocytes: 3 %
Lymphocytes Relative: 6 %
Lymphocytes Relative: 6 %
Lymphs Abs: 1.7 10*3/uL (ref 0.7–4.0)
Lymphs Abs: 1.4 10*3/uL (ref 0.7–4.0)
MCH: 29.2 pg (ref 26.0–34.0)
MCH: 29.3 pg (ref 26.0–34.0)
MCHC: 27.3 g/dL — ABNORMAL LOW (ref 30.0–36.0)
MCHC: 30 g/dL (ref 30.0–36.0)
MCV: 107 fL — ABNORMAL HIGH (ref 80.0–100.0)
MCV: 97.7 fL (ref 80.0–100.0)
Monocytes Absolute: 1.8 10*3/uL — ABNORMAL HIGH (ref 0.1–1.0)
Monocytes Absolute: 0.4 10*3/uL (ref 0.1–1.0)
Monocytes Relative: 7 %
Monocytes Relative: 2 %
Neutro Abs: 22 10*3/uL — ABNORMAL HIGH (ref 1.7–7.7)
Neutro Abs: 19.2 10*3/uL — ABNORMAL HIGH (ref 1.7–7.7)
Neutrophils Relative %: 81 %
Neutrophils Relative %: 89 %
Platelets: 452 10*3/uL — ABNORMAL HIGH (ref 150–400)
Platelets: 363 10*3/uL (ref 150–400)
RBC: 3.87 MIL/uL — ABNORMAL LOW (ref 4.22–5.81)
RBC: 3.55 MIL/uL — ABNORMAL LOW (ref 4.22–5.81)
RDW: 14.2 % (ref 11.5–15.5)
RDW: 14 % (ref 11.5–15.5)
WBC: 27.2 10*3/uL — ABNORMAL HIGH (ref 4.0–10.5)
WBC: 21.7 10*3/uL — ABNORMAL HIGH (ref 4.0–10.5)
nRBC: 0 % (ref 0.0–0.2)
nRBC: 0 % (ref 0.0–0.2)

## 2018-08-23 LAB — ABO/RH: ABO/RH(D): A POS

## 2018-08-23 LAB — POCT I-STAT 7, (LYTES, BLD GAS, ICA,H+H)
Acid-base deficit: 14 mmol/L — ABNORMAL HIGH (ref 0.0–2.0)
Acid-base deficit: 5 mmol/L — ABNORMAL HIGH (ref 0.0–2.0)
Bicarbonate: 14.2 mmol/L — ABNORMAL LOW (ref 20.0–28.0)
Bicarbonate: 18 mmol/L — ABNORMAL LOW (ref 20.0–28.0)
Calcium, Ion: 1.11 mmol/L — ABNORMAL LOW (ref 1.15–1.40)
Calcium, Ion: 1.02 mmol/L — ABNORMAL LOW (ref 1.15–1.40)
HCT: 31 % — ABNORMAL LOW (ref 39.0–52.0)
HCT: 29 % — ABNORMAL LOW (ref 39.0–52.0)
Hemoglobin: 10.5 g/dL — ABNORMAL LOW (ref 13.0–17.0)
Hemoglobin: 9.9 g/dL — ABNORMAL LOW (ref 13.0–17.0)
O2 Saturation: 99 %
O2 Saturation: 98 %
Patient temperature: 96.3
Patient temperature: 99.9
Potassium: 3.9 mmol/L (ref 3.5–5.1)
Potassium: 3.6 mmol/L (ref 3.5–5.1)
Sodium: 139 mmol/L (ref 135–145)
Sodium: 147 mmol/L — ABNORMAL HIGH (ref 135–145)
TCO2: 15 mmol/L — ABNORMAL LOW (ref 22–32)
TCO2: 19 mmol/L — ABNORMAL LOW (ref 22–32)
pCO2 arterial: 39.1 mmHg (ref 32.0–48.0)
pCO2 arterial: 28.4 mmHg — ABNORMAL LOW (ref 32.0–48.0)
pH, Arterial: 7.161 — CL (ref 7.350–7.450)
pH, Arterial: 7.413 (ref 7.350–7.450)
pO2, Arterial: 176 mmHg — ABNORMAL HIGH (ref 83.0–108.0)
pO2, Arterial: 114 mmHg — ABNORMAL HIGH (ref 83.0–108.0)

## 2018-08-23 LAB — LACTATE DEHYDROGENASE: LDH: 169 U/L (ref 98–192)

## 2018-08-23 LAB — COMPREHENSIVE METABOLIC PANEL
ALT: 26 U/L (ref 0–44)
ALT: 24 U/L (ref 0–44)
AST: 22 U/L (ref 15–41)
AST: 26 U/L (ref 15–41)
Albumin: 2.9 g/dL — ABNORMAL LOW (ref 3.5–5.0)
Albumin: 2.5 g/dL — ABNORMAL LOW (ref 3.5–5.0)
Alkaline Phosphatase: 146 U/L — ABNORMAL HIGH (ref 38–126)
Alkaline Phosphatase: 133 U/L — ABNORMAL HIGH (ref 38–126)
Anion gap: 46 — ABNORMAL HIGH (ref 5–15)
Anion gap: 35 — ABNORMAL HIGH (ref 5–15)
BUN: 107 mg/dL — ABNORMAL HIGH (ref 8–23)
BUN: 105 mg/dL — ABNORMAL HIGH (ref 8–23)
CO2: 12 mmol/L — ABNORMAL LOW (ref 22–32)
CO2: 11 mmol/L — ABNORMAL LOW (ref 22–32)
Calcium: 8.6 mg/dL — ABNORMAL LOW (ref 8.9–10.3)
Calcium: 7.4 mg/dL — ABNORMAL LOW (ref 8.9–10.3)
Chloride: 76 mmol/L — ABNORMAL LOW (ref 98–111)
Chloride: 92 mmol/L — ABNORMAL LOW (ref 98–111)
Creatinine, Ser: 5.36 mg/dL — ABNORMAL HIGH (ref 0.61–1.24)
Creatinine, Ser: 5.05 mg/dL — ABNORMAL HIGH (ref 0.61–1.24)
GFR calc Af Amer: 12 mL/min — ABNORMAL LOW (ref >60)
GFR calc Af Amer: 13 mL/min — ABNORMAL LOW (ref >60)
GFR calc non Af Amer: 11 mL/min — ABNORMAL LOW (ref >60)
GFR calc non Af Amer: 11 mL/min — ABNORMAL LOW (ref >60)
Glucose, Bld: >2500 mg/dL (ref 70–99)
Glucose, Bld: 1191 mg/dL (ref 70–99)
Potassium: 6.4 mmol/L (ref 3.5–5.1)
Potassium: 4 mmol/L (ref 3.5–5.1)
Sodium: 134 mmol/L — ABNORMAL LOW (ref 135–145)
Sodium: 138 mmol/L (ref 135–145)
Total Bilirubin: 1.3 mg/dL — ABNORMAL HIGH (ref 0.3–1.2)
Total Bilirubin: 1.5 mg/dL — ABNORMAL HIGH (ref 0.3–1.2)
Total Protein: 6.2 g/dL — ABNORMAL LOW (ref 6.5–8.1)
Total Protein: 5.3 g/dL — ABNORMAL LOW (ref 6.5–8.1)

## 2018-08-23 LAB — GLUCOSE, CAPILLARY
Glucose-Capillary: 219 mg/dL — ABNORMAL HIGH (ref 70–99)
Glucose-Capillary: 263 mg/dL — ABNORMAL HIGH (ref 70–99)
Glucose-Capillary: 343 mg/dL — ABNORMAL HIGH (ref 70–99)
Glucose-Capillary: 420 mg/dL — ABNORMAL HIGH (ref 70–99)
Glucose-Capillary: 538 mg/dL (ref 70–99)

## 2018-08-23 LAB — URINALYSIS, MICROSCOPIC (REFLEX)
RBC / HPF: NONE SEEN RBC/hpf (ref 0–5)
WBC, UA: NONE SEEN WBC/hpf (ref 0–5)

## 2018-08-23 LAB — POCT I-STAT EG7
Acid-base deficit: 18 mmol/L — ABNORMAL HIGH (ref 0.0–2.0)
Bicarbonate: 8.9 mmol/L — ABNORMAL LOW (ref 20.0–28.0)
Calcium, Ion: 0.83 mmol/L — CL (ref 1.15–1.40)
HCT: 35 % — ABNORMAL LOW (ref 39.0–52.0)
Hemoglobin: 11.9 g/dL — ABNORMAL LOW (ref 13.0–17.0)
O2 Saturation: 83 %
Potassium: 5.9 mmol/L — ABNORMAL HIGH (ref 3.5–5.1)
Sodium: 126 mmol/L — ABNORMAL LOW (ref 135–145)
TCO2: 10 mmol/L — ABNORMAL LOW (ref 22–32)
pCO2, Ven: 25.6 mmHg — ABNORMAL LOW (ref 44.0–60.0)
pH, Ven: 7.151 — CL (ref 7.250–7.430)
pO2, Ven: 59 mmHg — ABNORMAL HIGH (ref 32.0–45.0)

## 2018-08-23 LAB — LACTIC ACID, PLASMA
Lactic Acid, Venous: 5 mmol/L (ref 0.5–1.9)
Lactic Acid, Venous: 6.2 mmol/L (ref 0.5–1.9)
Lactic Acid, Venous: 5.3 mmol/L (ref 0.5–1.9)

## 2018-08-23 LAB — URINALYSIS, ROUTINE W REFLEX MICROSCOPIC
Bilirubin Urine: NEGATIVE
Glucose, UA: >1000 mg/dL — AB
Hgb urine dipstick: NEGATIVE
Ketones, ur: 15 mg/dL — AB
Leukocytes,Ua: NEGATIVE
Nitrite: NEGATIVE
Protein, ur: 30 mg/dL — AB
Specific Gravity, Urine: 1.01 (ref 1.005–1.030)
pH: 5 (ref 5.0–8.0)

## 2018-08-23 LAB — TYPE AND SCREEN
ABO/RH(D): A POS
Antibody Screen: NEGATIVE

## 2018-08-23 LAB — CBG MONITORING, ED
Glucose-Capillary: >600 mg/dL (ref 70–99)
Glucose-Capillary: >600 mg/dL (ref 70–99)
Glucose-Capillary: >600 mg/dL (ref 70–99)
Glucose-Capillary: >600 mg/dL (ref 70–99)
Glucose-Capillary: >600 mg/dL (ref 70–99)
Glucose-Capillary: >600 mg/dL (ref 70–99)

## 2018-08-23 LAB — SALICYLATE LEVEL: Salicylate Lvl: <7 mg/dL (ref 2.8–30.0)

## 2018-08-23 LAB — ACETAMINOPHEN LEVEL: Acetaminophen (Tylenol), Serum: <10 ug/mL — ABNORMAL LOW (ref 10–30)

## 2018-08-23 LAB — MRSA PCR SCREENING: MRSA by PCR: NEGATIVE

## 2018-08-23 LAB — AMMONIA: Ammonia: 155 umol/L — ABNORMAL HIGH (ref 9–35)

## 2018-08-23 LAB — TROPONIN I
Troponin I: 0.06 ng/mL (ref <0.03)
Troponin I: 0.31 ng/mL (ref <0.03)

## 2018-08-23 LAB — PROCALCITONIN: Procalcitonin: 4 ng/mL

## 2018-08-23 LAB — CK: Total CK: 181 U/L (ref 49–397)

## 2018-08-23 LAB — LIPASE, BLOOD: Lipase: 17 U/L (ref 11–51)

## 2018-08-23 LAB — PROTIME-INR
INR: 1 (ref 0.8–1.2)
Prothrombin Time: 13 seconds (ref 11.4–15.2)

## 2018-08-23 LAB — SARS CORONAVIRUS 2 BY RT PCR (HOSPITAL ORDER, PERFORMED IN ~~LOC~~ HOSPITAL LAB): SARS Coronavirus 2: NEGATIVE

## 2018-08-23 LAB — MAGNESIUM: Magnesium: 4.1 mg/dL — ABNORMAL HIGH (ref 1.7–2.4)

## 2018-08-23 MED ORDER — CHLORHEXIDINE GLUCONATE 0.12% ORAL RINSE (MEDLINE KIT)
15.0000 mL | Freq: Two times a day (BID) | OROMUCOSAL | Status: DC
  Administered 2018-08-23 – 2018-08-27 (×8): 15 mL via OROMUCOSAL

## 2018-08-23 MED ORDER — SODIUM CHLORIDE 0.9 % IV BOLUS
1000.0000 mL | Freq: Once | INTRAVENOUS | Status: AC
  Administered 2018-08-23: 1000 mL via INTRAVENOUS

## 2018-08-23 MED ORDER — SODIUM CHLORIDE 0.9 % IV BOLUS
30.0000 mL/kg | Freq: Once | INTRAVENOUS | Status: AC
  Administered 2018-08-23: 2067 mL via INTRAVENOUS

## 2018-08-23 MED ORDER — SODIUM CHLORIDE 0.9 % IV SOLN
80.0000 mg | Freq: Once | INTRAVENOUS | Status: AC
  Administered 2018-08-23: 80 mg via INTRAVENOUS
  Filled 2018-08-23: qty 80

## 2018-08-23 MED ORDER — LACTATED RINGERS IV BOLUS
1000.0000 mL | Freq: Once | INTRAVENOUS | Status: AC
  Administered 2018-08-23: 1000 mL via INTRAVENOUS

## 2018-08-23 MED ORDER — LACTATED RINGERS IV BOLUS
1000.0000 mL | Freq: Once | INTRAVENOUS | Status: AC
  Administered 2018-08-23: 19:00:00 1000 mL via INTRAVENOUS

## 2018-08-23 MED ORDER — OCTREOTIDE LOAD VIA INFUSION
50.0000 ug | Freq: Once | INTRAVENOUS | Status: AC
  Administered 2018-08-23: 50 ug via INTRAVENOUS
  Filled 2018-08-23: qty 25

## 2018-08-23 MED ORDER — SODIUM CHLORIDE 0.9 % IV SOLN
INTRAVENOUS | Status: DC
  Administered 2018-08-23: 19:00:00 via INTRAVENOUS

## 2018-08-23 MED ORDER — SODIUM CHLORIDE 0.9 % IV BOLUS
1000.0000 mL | Freq: Once | INTRAVENOUS | Status: AC
  Administered 2018-08-23: 11:00:00 1000 mL via INTRAVENOUS

## 2018-08-23 MED ORDER — SODIUM CHLORIDE 0.9 % IV SOLN
INTRAVENOUS | Status: DC | PRN
  Administered 2018-08-25: 20:00:00 via INTRA_ARTERIAL

## 2018-08-23 MED ORDER — DEXTROSE-NACL 5-0.45 % IV SOLN: INTRAVENOUS | Status: DC

## 2018-08-23 MED ORDER — PROPOFOL 1000 MG/100ML IV EMUL
5.0000 ug/kg/min | INTRAVENOUS | Status: DC
  Administered 2018-08-23: 35 ug/kg/min via INTRAVENOUS
  Administered 2018-08-23: 14:00:00 5 ug/kg/min via INTRAVENOUS
  Administered 2018-08-24: 40 ug/kg/min via INTRAVENOUS
  Administered 2018-08-24: 25 ug/kg/min via INTRAVENOUS
  Administered 2018-08-24 – 2018-08-25 (×2): 35 ug/kg/min via INTRAVENOUS
  Administered 2018-08-25: 30 ug/kg/min via INTRAVENOUS
  Administered 2018-08-25: 40 ug/kg/min via INTRAVENOUS
  Administered 2018-08-25 – 2018-08-26 (×2): 30 ug/kg/min via INTRAVENOUS
  Administered 2018-08-26: 40 ug/kg/min via INTRAVENOUS
  Administered 2018-08-27: 35 ug/kg/min via INTRAVENOUS
  Filled 2018-08-23 (×12): qty 100

## 2018-08-23 MED ORDER — ORAL CARE MOUTH RINSE
15.0000 mL | OROMUCOSAL | Status: DC
  Administered 2018-08-23 – 2018-08-27 (×37): 15 mL via OROMUCOSAL

## 2018-08-23 MED ORDER — NOREPINEPHRINE 4 MG/250ML-% IV SOLN
INTRAVENOUS | Status: AC
  Administered 2018-08-23: 2 ug/min via INTRAVENOUS
  Filled 2018-08-23: qty 250

## 2018-08-23 MED ORDER — DEXTROSE-NACL 5-0.45 % IV SOLN
INTRAVENOUS | Status: DC
  Administered 2018-08-23 – 2018-08-24 (×2): via INTRAVENOUS

## 2018-08-23 MED ORDER — PROPOFOL 1000 MG/100ML IV EMUL
INTRAVENOUS | Status: AC
  Administered 2018-08-23: 5 ug/kg/min via INTRAVENOUS
  Filled 2018-08-23: qty 100

## 2018-08-23 MED ORDER — OCTREOTIDE LOAD VIA INFUSION: 50.0000 ug | Freq: Once | INTRAVENOUS | Status: DC

## 2018-08-23 MED ORDER — SODIUM CHLORIDE 0.9 % IV SOLN
3.0000 g | Freq: Two times a day (BID) | INTRAVENOUS | Status: DC
  Administered 2018-08-23 – 2018-08-25 (×5): 3 g via INTRAVENOUS
  Filled 2018-08-23 (×7): qty 3

## 2018-08-23 MED ORDER — SODIUM CHLORIDE 0.9 % IV SOLN
50.0000 ug/h | INTRAVENOUS | Status: DC
  Administered 2018-08-23 – 2018-08-25 (×5): 50 ug/h via INTRAVENOUS
  Filled 2018-08-23 (×7): qty 1

## 2018-08-23 MED ORDER — NOREPINEPHRINE 16 MG/250ML-% IV SOLN
0.0000 ug/min | INTRAVENOUS | Status: DC
  Administered 2018-08-23: 40 ug/min via INTRAVENOUS
  Filled 2018-08-23 (×2): qty 250

## 2018-08-23 MED ORDER — INSULIN REGULAR(HUMAN) IN NACL 100-0.9 UT/100ML-% IV SOLN
INTRAVENOUS | Status: DC
  Administered 2018-08-23: 5.4 [IU]/h via INTRAVENOUS
  Administered 2018-08-23: 27.1 [IU]/h via INTRAVENOUS
  Administered 2018-08-24: 4.5 [IU]/h via INTRAVENOUS
  Filled 2018-08-23 (×3): qty 100

## 2018-08-23 MED ORDER — NOREPINEPHRINE 4 MG/250ML-% IV SOLN
0.0000 ug/min | INTRAVENOUS | Status: DC
  Administered 2018-08-23: 40 ug/min via INTRAVENOUS
  Administered 2018-08-23: 2 ug/min via INTRAVENOUS
  Administered 2018-08-23: 19:00:00 36 ug/min via INTRAVENOUS
  Filled 2018-08-23 (×2): qty 250

## 2018-08-23 MED ORDER — FAMOTIDINE IN NACL 20-0.9 MG/50ML-% IV SOLN: 20.0000 mg | Freq: Once | INTRAVENOUS | Status: DC

## 2018-08-23 MED ORDER — METOCLOPRAMIDE HCL 5 MG/ML IJ SOLN
10.0000 mg | Freq: Once | INTRAMUSCULAR | Status: AC
  Administered 2018-08-23: 10 mg via INTRAVENOUS
  Filled 2018-08-23: qty 2

## 2018-08-23 NOTE — ED Notes (Signed)
Pt placed on a bear hugger with forced warm air. Rectal temp 90.3.

## 2018-08-23 NOTE — ED Notes (Signed)
Attempted to call report to 64M. Was told to call back in about 10 minutes.

## 2018-08-23 NOTE — Progress Notes (Signed)
eLink Physician-Brief Progress Note Patient Name: ARLYNN MCDERMID DOB: 1955-05-22 MRN: [id number]   Date of Service  08/23/2018  HPI/Events of Note  Nursing concerned that patient is on maximum dose of Norepinephrine IV infusion. Last pH at 3:47 PM = 7.161.  eICU Interventions  Will order: 1. Monitor CVP now and Q 4 hours.  2. ABG STAT.     Intervention Category Major Interventions: Hypotension - evaluation and management  Sommer,Steven Eugene 08/23/2018, 9:07 PM

## 2018-08-23 NOTE — Progress Notes (Signed)
eLink Physician-Brief Progress Note Patient Name: [name] DOB: 1956-01-25 MRN: [id number]   Date of Service  08/23/2018  HPI/Events of Note  Troponin = 0.06 --> 0.31. Demand ischemia? Can't use ASA or Heparin IV infusion d/t coffee grounds from gastric tube. Can' t use B-Blocker as the patient is on a Norepinephrine IV infusion.   eICU Interventions  Continue to trend Troponin.      Intervention Category Intermediate Interventions: Diagnostic test evaluation  [name] 08/23/2018, 7:58 PM

## 2018-08-23 NOTE — Progress Notes (Signed)
CRITICAL VALUE ALERT  Critical Value:  Glucose 724   Troponin 0.31  Date & Time Notied:  08/23/18  1950  Provider Notified: Warren Lacy  Orders Received/Actions taken:

## 2018-08-23 NOTE — ED Notes (Signed)
Pt had x1 episode dark brown emesis, EDP aware

## 2018-08-23 NOTE — ED Notes (Signed)
Patient given 20 etomidate and 100 roc for intubation.

## 2018-08-23 NOTE — ED Provider Notes (Signed)
[facility] EMERGENCY DEPARTMENT Provider Note   CSN: [id number] Arrival date & time: 08/23/18  1034    History   Chief Complaint Chief Complaint  Patient presents with   Altered Mental Status    HPI [name] is a 63 y.o. male.     63yo male with history of hepatitis C, poorly controlled T1DM, polysubstance abuse, brought in by EMS from home for altered mental status. Patient was recently discharged from the hospital on 08/15/2018, admitted for poor glycemic control, dc home with sliding scale insulin instructions. Patient is in a c-collar with brown blood in mouth/on face, moans.      Past Medical History:  Diagnosis Date   DEPRESSION    DIABETES MELLITUS, TYPE I    DRUG ABUSE    pt should have NO controlled substances rx'ed   Glaucoma    HEPATITIS C    chronic   Hypertension 02/18/2011   Proliferative diabetic retinopathy(362.02)    Vitiligo     Patient Active Problem List   Diagnosis Date Noted   Hypoglycemia associated with diabetes ([place]) 08/12/2018   At risk for adverse drug event 02/17/2018   Abrasions of multiple sites    Demand ischemia of myocardium ([place]) 77/93/9030   Acute metabolic encephalopathy 01/05/3006   DKA (diabetic ketoacidoses) ([place]) 02/03/2018   Leukocytosis 02/03/2018   Hypoglycemia 12/24/2017   Syncope 12/24/2017   Syncope and collapse 12/23/2017   IV drug abuse ([place]) 04/19/2017   Acute encephalopathy 04/19/2017   Hemangioma 10/02/2016   Foreign body (FB) in soft tissue    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[place])    Gangrene ([place])    Type I (juvenile type) diabetes mellitus without mention of complication, not stated as uncontrolled    Tobacco abuse    Essential hypertension    Diabetic infection of right foot ([place]) 07/22/2016   Diabetic foot infection ([place]) 07/22/2016   Chronic ulcer of heel, right, with fat layer exposed ([place]) 05/20/2016   DKA, type 1 ([place]) 04/18/2016   Acute  kidney injury ([place]) 04/18/2016   Hyponatremia 04/18/2016   Severe protein-calorie malnutrition ([place]) 04/18/2016   Elevated troponin 04/18/2016   Poor dentition 07/12/2015   Tobacco use 07/06/2013   PAD (peripheral artery disease) ([place]) 04/29/2013   COPD, mild ([place])    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 ([place]) 11/18/2011   Diabetic neuropathy ([place]) 06/21/2010   PROLIFERATIVE DIABETIC RETINOPATHY 06/21/2010   SKIN TAG 01/30/2010   Chronic hepatitis C ([place]) 11/22/2008   VITILIGO 11/22/2008   PROTEINURIA, MILD 11/22/2008   Substance abuse ([place]) 04/23/2007   DEPRESSION 11/11/2006    Past Surgical History:  Procedure Laterality Date   ABDOMINAL AORTOGRAM W/LOWER EXTREMITY N/A 07/25/2016   Procedure: Abdominal Aortogram w/Bilateral Lower Extremity Runoff;  Surgeon: Conrad Kinston, MD;  Location: [facility];  Service: Cardiovascular;  Laterality: N/A;   ABDOMINAL AORTOGRAM W/LOWER EXTREMITY N/A 12/24/2016   Procedure: ABDOMINAL AORTOGRAM W/LOWER EXTREMITY;  Surgeon: Serafina Mitchell, MD;  Location: [place] [facility];  Service: Cardiovascular;  Laterality: N/A;   AMPUTATION TOE Right 07/26/2016   Procedure: AMPUTATION GREAT TOE;  Surgeon: Serafina Mitchell, MD;  Location: [facility] OR;  Service: Vascular;  Laterality: Right;   EYE SURGERY     retinal surgery x 2, right eye   INSERTION OF ILIAC STENT Right 07/26/2016   Procedure: INSERTION OF RIGHT POPLITEAL STENT WITH BALLOON ANGIOPLASTY;  Surgeon: Serafina Mitchell, MD;  Location: [facility]  OR;  Service: Vascular;  Laterality: Right;   LOWER EXTREMITY ANGIOGRAM Right 07/26/2016   Procedure: LOWER EXTREMITY ANGIOGRAM;  Surgeon: Serafina Mitchell, MD;  Location: [facility];  Service: Vascular;  Laterality: Right;   PERIPHERAL VASCULAR BALLOON ANGIOPLASTY Right 07/26/2016   Procedure: PERIPHERAL VASCULAR BALLOON ANGIOPLASTY  RIGHT ANTERIOR TIBEAL ARTERY AND RIGHT SUPERFICIAL FEMORAL ARTERY;  Surgeon: Serafina Mitchell,  MD;  Location: [place];  Service: Vascular;  Laterality: Right;   PERIPHERAL VASCULAR INTERVENTION Right 12/24/2016   Procedure: PERIPHERAL VASCULAR INTERVENTION;  Surgeon: Serafina Mitchell, MD;  Location: [place];  Service: Cardiovascular;  Laterality: Right;  lower extr        Home Medications    Prior to Admission medications   Medication Sig Start Date End Date Taking? Authorizing Provider  amLODipine (NORVASC) 10 MG tablet Take 1 tablet (10 mg total) by mouth daily for 30 days. 08/15/18 09/14/18 Yes Mikhail, Maryann, DO  gabapentin (NEURONTIN) 400 MG capsule Take 1 capsule (400 mg total) by mouth 3 (three) times daily for 30 days. 08/15/18 09/14/18 Yes Mikhail, Velta Addison, DO  insulin aspart protamine - aspart (NOVOLOG 70/30 MIX) (70-30) 100 UNIT/ML FlexPen Inject 15 units every morning and 10 units every evening. 08/15/18  Yes Mikhail, Velta Addison, DO  pantoprazole (PROTONIX) 40 MG tablet Take 1 tablet (40 mg total) by mouth daily at 6 (six) AM. 08/15/18  Yes Cristal Ford, DO  Accu-Chek Softclix Lancets lancets Use to monitor glucose levels BID; E10.29 08/19/18   Renato Shin, MD  Blood Glucose Calibration (ACCU-CHEK AVIVA) SOLN 1 each by In Vitro route as needed. Use to calibrate meter prn 08/19/18   Renato Shin, MD  blood glucose meter kit and supplies KIT Dispense based on patient and insurance preference. Use up to four times daily as directed. (FOR ICD-9 250.00, 250.01). 08/15/18   Cristal Ford, DO  Blood Glucose Monitoring Suppl (ACCU-CHEK AVIVA PLUS) w/Device KIT 1 each by Does not apply route 2 (two) times a day. Use to monitor glucose levels BID; E10.29 08/19/18   Renato Shin, MD  feeding supplement, ENSURE ENLIVE, (ENSURE ENLIVE) LIQD Take 237 mLs by mouth 2 (two) times daily between meals for 30 days. 08/15/18 09/14/18  Cristal Ford, DO  glucose blood (ACCU-CHEK AVIVA PLUS) test strip Use to monitor glucose levels BID; E10.29 08/19/18   Renato Shin, MD  Insulin Pen Needle 31G X 5 MM  MISC Use with novolog flex pen 08/15/18   Cristal Ford, DO  Isopropyl Alcohol Wipes 70 % MISC Apply 1 each topically daily as needed. 02/27/18   Medina-Vargas, Monina C, NP  nicotine (NICODERM CQ - DOSED IN MG/24 HOURS) 21 mg/24hr patch Place 1 patch (21 mg total) onto the skin daily. 08/16/18   Cristal Ford, DO    Family History Family History  Problem Relation Age of Onset   Diabetes Father    Kidney disease Father    Arthritis Mother    Heart disease Mother        CAD    Social History Social History   Tobacco Use   Smoking status: Current Every Day Smoker    Packs/day: 1.00    Years: 51.00    Pack years: 51.00    Types: Cigarettes   Smokeless tobacco: Former Systems developer    Types: Chew    Quit date: 1985  Substance Use Topics   Alcohol use: No    Alcohol/week: 0.0 standard drinks    Comment: 08/14/14 none   Drug use:  Yes    Types: Marijuana, Heroin, Cocaine, "Crack" cocaine, Fentanyl, Methylphenidate    Comment: s/p rehab x 6, occasional drug use (per pt last  use was 06/2015)     Allergies   Codeine   Review of Systems Review of Systems  Unable to perform ROS: Mental status change     Physical Exam Updated Vital Signs BP (!) 105/54    Pulse 88    Temp (!) 95.3 F (35.2 C)    Resp 14    SpO2 100%   Physical Exam Vitals signs and nursing note reviewed.  Constitutional:      Appearance: He is underweight.     Comments: Alert to painful stimulus- moans, ill kept  HENT:     Head: Atraumatic.     Mouth/Throat:     Mouth: Mucous membranes are dry.     Comments: Dark blood in mouth/face Eyes:     Comments: Pupils sluggish   Neck:     Comments: c-collar on Cardiovascular:     Rate and Rhythm: Normal rate and regular rhythm.     Heart sounds: Normal heart sounds.  Pulmonary:     Effort: Tachypnea present.     Breath sounds: Normal breath sounds. No decreased breath sounds.  Abdominal:     Tenderness: There is generalized abdominal tenderness.    Musculoskeletal:        General: No tenderness or deformity.  Skin:    General: Skin is dry.     Findings: No erythema.  Neurological:     Mental Status: He is lethargic.     GCS: GCS eye subscore is 2. GCS verbal subscore is 3. GCS motor subscore is 5.     Comments: Aware of name      ED Treatments / Results  Labs (all labs ordered are listed, but only abnormal results are displayed) Labs Reviewed  COMPREHENSIVE METABOLIC PANEL - Abnormal; Notable for the following components:      Result Value   Sodium 134 (*)    Potassium 6.4 (*)    Chloride 76 (*)    CO2 12 (*)    Glucose, Bld >2,500 (*)    BUN 107 (*)    Creatinine, Ser 5.36 (*)    Calcium 8.6 (*)    Total Protein 6.2 (*)    Albumin 2.9 (*)    Alkaline Phosphatase 146 (*)    Total Bilirubin 1.3 (*)    GFR calc non Af Amer 11 (*)    GFR calc Af Amer 12 (*)    Anion gap 46 (*)    All other components within normal limits  TROPONIN I - Abnormal; Notable for the following components:   Troponin I 0.06 (*)    All other components within normal limits  URINALYSIS, ROUTINE W REFLEX MICROSCOPIC - Abnormal; Notable for the following components:   Color, Urine YELLOW (*)    APPearance CLEAR (*)    Glucose, UA >1,000 (*)    Ketones, ur 15 (*)    Protein, ur 30 (*)    All other components within normal limits  AMMONIA - Abnormal; Notable for the following components:   Ammonia 155 (*)    All other components within normal limits  ACETAMINOPHEN LEVEL - Abnormal; Notable for the following components:   Acetaminophen (Tylenol), Serum <10 (*)    All other components within normal limits  MAGNESIUM - Abnormal; Notable for the following components:   Magnesium 4.1 (*)    All other  components within normal limits  LACTIC ACID, PLASMA - Abnormal; Notable for the following components:   Lactic Acid, Venous 5.0 (*)    All other components within normal limits  CBC WITH DIFFERENTIAL/PLATELET - Abnormal; Notable for the  following components:   WBC 27.2 (*)    RBC 3.87 (*)    Hemoglobin 11.3 (*)    MCV 107.0 (*)    MCHC 27.3 (*)    Platelets 452 (*)    Neutro Abs 22.0 (*)    Monocytes Absolute 1.8 (*)    Basophils Absolute 0.2 (*)    Abs Immature Granulocytes 1.42 (*)    All other components within normal limits  URINALYSIS, MICROSCOPIC (REFLEX) - Abnormal; Notable for the following components:   Bacteria, UA FEW (*)    All other components within normal limits  CBC WITH DIFFERENTIAL/PLATELET - Abnormal; Notable for the following components:   WBC 21.7 (*)    RBC 3.55 (*)    Hemoglobin 10.4 (*)    HCT 34.7 (*)    Neutro Abs 19.2 (*)    Abs Immature Granulocytes 0.63 (*)    All other components within normal limits  COMPREHENSIVE METABOLIC PANEL - Abnormal; Notable for the following components:   Chloride 92 (*)    CO2 11 (*)    Glucose, Bld 1,191 (*)    BUN 105 (*)    Creatinine, Ser 5.05 (*)    Calcium 7.4 (*)    Total Protein 5.3 (*)    Albumin 2.5 (*)    Alkaline Phosphatase 133 (*)    Total Bilirubin 1.5 (*)    GFR calc non Af Amer 11 (*)    GFR calc Af Amer 13 (*)    Anion gap 35 (*)    All other components within normal limits  CBG MONITORING, ED - Abnormal; Notable for the following components:   Glucose-Capillary >600 (*)    All other components within normal limits  CBG MONITORING, ED - Abnormal; Notable for the following components:   Glucose-Capillary >600 (*)    All other components within normal limits  POCT I-STAT EG7 - Abnormal; Notable for the following components:   pH, Ven 7.151 (*)    pCO2, Ven 25.6 (*)    pO2, Ven 59.0 (*)    Bicarbonate 8.9 (*)    TCO2 10 (*)    Acid-base deficit 18.0 (*)    Sodium 126 (*)    Potassium 5.9 (*)    Calcium, Ion 0.83 (*)    HCT 35.0 (*)    Hemoglobin 11.9 (*)    All other components within normal limits  CBG MONITORING, ED - Abnormal; Notable for the following components:   Glucose-Capillary >600 (*)    All other components  within normal limits  CBG MONITORING, ED - Abnormal; Notable for the following components:   Glucose-Capillary >600 (*)    All other components within normal limits  SARS CORONAVIRUS 2 (HOSPITAL ORDER, [place] [facility])  CULTURE, BLOOD (ROUTINE X 2)  CULTURE, BLOOD (ROUTINE X 2)  SARS CORONAVIRUS 2 (HOSPITAL ORDER, [place] [facility])  LIPASE, BLOOD  SALICYLATE LEVEL  CK  PROTIME-INR  OCCULT BLOOD GASTRIC / DUODENUM (SPECIMEN CUP)  BLOOD GAS, ARTERIAL  POC OCCULT BLOOD, ED  TYPE AND SCREEN    EKG EKG Interpretation  Date/Time:  Sunday Aug 23 2018 10:36:01 EDT Ventricular Rate:  153 PR Interval:    QRS Duration: 103 QT Interval:  271 QTC  Calculation: 341 R Axis:   1 Text Interpretation:  Normal sinus rhythm Ventricular bigeminy Repolarization abnormality, prob rate related hyperacute t waves Since last tracing t wave abnormalities now seen Confirmed by Noemi Chapel [phone]) on 08/23/2018 11:09:33 AM   Radiology Ct Head Wo Contrast  Result Date: 08/23/2018 CLINICAL DATA:  Unwitnessed fall. EXAM: CT HEAD WITHOUT CONTRAST CT CERVICAL SPINE WITHOUT CONTRAST TECHNIQUE: Multidetector CT imaging of the head and cervical spine was performed following the standard protocol without intravenous contrast. Multiplanar CT image reconstructions of the cervical spine were also generated. COMPARISON:  CT scan of February 03, 2018. FINDINGS: CT HEAD FINDINGS Brain: Mild diffuse cortical atrophy is noted. Mild chronic ischemic white matter disease is noted. Stable small calcified right frontal meningioma. No mass effect or midline shift is noted. There is no evidence of hemorrhage or acute infarction. Vascular: No hyperdense vessel or unexpected calcification. Skull: Normal. Negative for fracture or focal lesion. Sinuses/Orbits: No acute finding. Other: None. CT CERVICAL SPINE FINDINGS Alignment: Normal. Skull base and vertebrae: No acute fracture. No primary bone  lesion or focal pathologic process. Soft tissues and spinal canal: No prevertebral fluid or swelling. No visible canal hematoma. Disc levels: Severe degenerative disc disease is noted at C6-7 with anterior and posterior osteophyte formation. Upper chest: Negative. Other: None. IMPRESSION: Mild diffuse cortical atrophy. Mild chronic ischemic white matter disease. No acute intracranial abnormality seen. Severe degenerative disc disease is noted at C6-7. No acute abnormality seen in the cervical spine. Electronically Signed   By: Marijo Conception M.D.   On: 08/23/2018 13:35   Ct Cervical Spine Wo Contrast  Result Date: 08/23/2018 CLINICAL DATA:  Unwitnessed fall. EXAM: CT HEAD WITHOUT CONTRAST CT CERVICAL SPINE WITHOUT CONTRAST TECHNIQUE: Multidetector CT imaging of the head and cervical spine was performed following the standard protocol without intravenous contrast. Multiplanar CT image reconstructions of the cervical spine were also generated. COMPARISON:  CT scan of February 03, 2018. FINDINGS: CT HEAD FINDINGS Brain: Mild diffuse cortical atrophy is noted. Mild chronic ischemic white matter disease is noted. Stable small calcified right frontal meningioma. No mass effect or midline shift is noted. There is no evidence of hemorrhage or acute infarction. Vascular: No hyperdense vessel or unexpected calcification. Skull: Normal. Negative for fracture or focal lesion. Sinuses/Orbits: No acute finding. Other: None. CT CERVICAL SPINE FINDINGS Alignment: Normal. Skull base and vertebrae: No acute fracture. No primary bone lesion or focal pathologic process. Soft tissues and spinal canal: No prevertebral fluid or swelling. No visible canal hematoma. Disc levels: Severe degenerative disc disease is noted at C6-7 with anterior and posterior osteophyte formation. Upper chest: Negative. Other: None. IMPRESSION: Mild diffuse cortical atrophy. Mild chronic ischemic white matter disease. No acute intracranial abnormality seen.  Severe degenerative disc disease is noted at C6-7. No acute abnormality seen in the cervical spine. Electronically Signed   By: Marijo Conception M.D.   On: 08/23/2018 13:35   Dg Chest Port 1 View  Result Date: 08/23/2018 CLINICAL DATA:  Status post intubation EXAM: PORTABLE CHEST 1 VIEW COMPARISON:  08/23/2018 FINDINGS: Cardiac shadow is stable. Right jugular central line and endotracheal tube are noted in satisfactory position. No pneumothorax is seen. No focal infiltrate is noted. IMPRESSION: Tubes and lines as described above. No significant interval changes noted. Electronically Signed   By: Inez Catalina M.D.   On: 08/23/2018 15:08   Dg Chest Port 1 View  Result Date: 08/23/2018 CLINICAL DATA:  Altered mental status and possible aspiration. EXAM:  PORTABLE CHEST 1 VIEW COMPARISON:  08/12/2018 and prior radiographs FINDINGS: The cardiomediastinal silhouette is unremarkable. There is no evidence of focal airspace disease, pulmonary edema, suspicious pulmonary nodule/mass, pleural effusion, or pneumothorax. No acute bony abnormalities are identified. Remote LEFT rib fractures again noted. IMPRESSION: No active disease. Electronically Signed   By: Margarette Canada M.D.   On: 08/23/2018 11:20    Procedures .Critical Care Performed by: Tacy Learn, PA-C Authorized by: Tacy Learn, PA-C   Critical care provider statement:    Critical care time (minutes):  45   Critical care was necessary to treat or prevent imminent or life-threatening deterioration of the following conditions:  Circulatory failure, dehydration, hepatic failure, metabolic crisis, sepsis and respiratory failure   Critical care was time spent personally by me on the following activities:  Discussions with consultants, evaluation of patient's response to treatment, examination of patient, ordering and performing treatments and interventions, ordering and review of laboratory studies, ordering and review of radiographic studies, pulse  oximetry, re-evaluation of patient's condition, obtaining history from patient or surrogate and review of old charts   (including critical care time)  Medications Ordered in ED Medications  dextrose 5 %-0.45 % sodium chloride infusion ( Intravenous Hold 08/23/18 1204)  insulin regular, human (MYXREDLIN) 100 units/ 100 mL infusion (5.4 Units/hr Intravenous New Bag/Given 08/23/18 1132)  famotidine (PEPCID) IVPB 20 mg premix (has no administration in time range)  pantoprazole (PROTONIX) 80 mg in sodium chloride 0.9 % 100 mL IVPB (has no administration in time range)  octreotide (SANDOSTATIN) 2 mcg/mL load via infusion 50 mcg (has no administration in time range)    And  octreotide (SANDOSTATIN) 500 mcg in sodium chloride 0.9 % 250 mL (2 mcg/mL) infusion (has no administration in time range)  norepinephrine (LEVOPHED) '4mg'$  in 247m premix infusion (2 mcg/min Intravenous New Bag/Given 08/23/18 1408)  propofol (DIPRIVAN) 1000 MG/100ML infusion (5 mcg/kg/min  68.9 kg Intravenous New Bag/Given 08/23/18 1429)  0.9 %  sodium chloride infusion (has no administration in time range)  sodium chloride 0.9 % bolus 1,000 mL (0 mLs Intravenous Stopped 08/23/18 1249)  sodium chloride 0.9 % bolus 2,067 mL (0 mL/kg  68.9 kg Intravenous Stopped 08/23/18 1249)  metoCLOPramide (REGLAN) injection 10 mg (10 mg Intravenous Given 08/23/18 1335)     Initial Impression / Assessment and Plan / ED Course  I have reviewed the triage vital signs and the nursing notes.  Pertinent labs & imaging results that were available during my care of the patient were reviewed by me and considered in my medical decision making (see chart for details).  Clinical Course as of Aug 23 1515  Sun Aug 23, 2018  1232 Glucose(!!): >2,500 [BM]  1426 [place] brought in by EMS for altered mental status with IO access to the right humerus with coffee ground emesis to face, not actively vomiting. No obvious trauma. Patient hypotensive, hypothermic,  started on warmer while awaiting labs. Patient's glucose greater than 2,500, started on glucostablizer for severe DKA. Cr 5.5, on IV fluids. CBC with WBC 27.2, hgb 11.3 (not significant anemia for GI bleed, repeated after additional emesis and found to be 10.4, type and screen ordered in case patient continues to bleed or need transfusion).  CXR negative for PNA, CT head/neck negative for acute findings.    [LM]  1513 Patient returned from CT, experienced additional vomiting with cofffee ground emesis and had decrease in mental status, became tachypenic with hypoxemia. Decision was made to sedate and intubate  patient. Patient managed with Dr. Sabra Heck, ER attending, who has consulted critical care for admission.    [LM]    Clinical Course User Index [BM] Noemi Chapel, MD [LM] Tacy Learn, PA-C      Final Clinical Impressions(s) / ED Diagnoses   Final diagnoses:  Altered mental status, unspecified altered mental status type  Diabetic ketoacidosis with coma associated with type 1 diabetes mellitus ([place])  Hypothermia, initial encounter  Hypotension, unspecified hypotension type  AKI (acute kidney injury) ([place])  Acute liver failure without hepatic coma  Gastrointestinal hemorrhage, unspecified gastrointestinal hemorrhage type    ED Discharge Orders    None       Roque Lias 08/23/18 1518    Noemi Chapel, MD 08/24/18 [phone]

## 2018-08-23 NOTE — ED Provider Notes (Signed)
Medical screening examination/treatment/procedure(s) were conducted as a shared visit with non-physician practitioner(s) and myself.  I personally evaluated the patient during the encounter.  Clinical Impression:   Final diagnoses:  Altered mental status, unspecified altered mental status type  Diabetic ketoacidosis with coma associated with type 1 diabetes mellitus ([place])  Hypothermia, initial encounter  Hypotension, unspecified hypotension type  AKI (acute kidney injury) ([place])  Acute liver failure without hepatic coma  Gastrointestinal hemorrhage, unspecified gastrointestinal hemorrhage type     EKG Interpretation  Date/Time:  Sunday Aug 23 2018 10:36:01 EDT Ventricular Rate:  153 PR Interval:    QRS Duration: 103 QT Interval:  271 QTC Calculation: 341 R Axis:   1 Text Interpretation:  Normal sinus rhythm Ventricular bigeminy Repolarization abnormality, prob rate related hyperacute t waves Since last tracing t wave abnormalities now seen Confirmed by Noemi Chapel [phone]) on 08/23/2018 11:09:33 AM      Ill-appearing poorly controlled diabetic at the age of 10 presents by EMS with altered mental status.  Level 5 caveat applies.  On exam the patient has a tender abdomen, he has dried blood caked in his mouth, he is tachycardic and has peaked T waves on his EKG.  He is known to have some chronic kidney disease and was most recently discharged from the hospital after some poor glycemic control.  The patient is critically ill, he will need fluid resuscitation, evaluation for the cause of any potential GI bleeding and altered mental status.  He was found on the ground and thus we will check for creatine kinase for rhabdomyolysis, CT scan of the head and cervical spine to rule out trauma or hemorrhage,  Needs admission for multiple reasons,  1.  Severe diabetic ketoacidosis, anion gap of more than 46, initially treated with IV fluids and then insulin drip.  Hyperkalemia was also seen but  likely result of kidney failure and dehydration.  Insulin should help correct 2.  Severe hyperglycemia with a level measuring greater than 2500, hypernatremic with a corrected sodium of somewhere between 170 and 190 based on believable glycemia and depending on which formula is used. 3.  Acute kidney injury with a creatinine over 5 4.  Severe dehydration 5.  Liver failure with elevated ammonia.  Thankfully the patient is improving with IV fluids and is now awake, talking and tolerating his airway.  He is getting IV fluids, insulin drip, critically ill.    The patient had a persistent decline, his oxygen started to drip lower and lower eventually ending up in the mid 80% on a nonrebreather.  He had repeated episodes of vomiting blood when he came back from the CT scan machine.  Because of this it was decided that to protect his airway and because of his hypoxia that he would need intubation.  This was done on the first attempt, he had excessive amounts of what appeared to be coffee-ground emesis in the back of his throat.  Because of this he was started on octreotide and Protonix.  Additionally the patient was started on Levophed prior to intubation due to blood pressure of 78 systolic.  This was improved, intubation was smooth, one attempt, no aspiration.  Central line was placed, please see note below.  The patient remained hypotensive and because of the need for vasopressor therapy and multiple medications this was placed, it was placed in the right internal jugular vein on the first attempt.  X-ray ordered.  ICU will admit the patient  I agree with critical care documentation  as written by physician assistant Percell Christobal Morado  Procedure Name: Intubation Date/Time: 08/23/2018 2:25 PM Performed by: Noemi Chapel, MD Pre-anesthesia Checklist: Patient identified, Patient being monitored, Emergency Drugs available, Timeout performed and Suction available Oxygen Delivery Method: Non-rebreather  mask Preoxygenation: Pre-oxygenation with 100% oxygen Induction Type: Rapid sequence Ventilation: Mask ventilation without difficulty Laryngoscope Size: 4 and Mac Grade View: Grade IV Tube size: 7.5 mm Number of attempts: 1 Intubation method: Direct laryngoscopy. Placement Confirmation: ETT inserted through vocal cords under direct vision,  CO2 detector and Breath sounds checked- equal and bilateral Secured at: 24 cm Tube secured with: ETT holder Dental Injury: Teeth and Oropharynx as per pre-operative assessment  Difficulty Due To: Difficulty was anticipated Comments:      .Central Line Date/Time: 08/23/2018 2:26 PM Performed by: Noemi Chapel, MD Authorized by: Noemi Chapel, MD   Consent:    Consent obtained:  Emergent situation Pre-procedure details:    Hand hygiene: Hand hygiene performed prior to insertion     Sterile barrier technique: All elements of maximal sterile technique followed     Skin preparation:  ChloraPrep   Skin preparation agent: Skin preparation agent completely dried prior to procedure   Anesthesia (see MAR for exact dosages):    Anesthesia method:  None Procedure details:    Location:  R internal jugular   Patient position:  Flat   Procedural supplies:  Triple lumen   Catheter size:  7 Fr   Landmarks identified: yes     Ultrasound guidance: no     Number of attempts:  1   Successful placement: yes   Post-procedure details:    Post-procedure:  Line sutured and dressing applied   Assessment:  Blood return through all ports, free fluid flow, no pneumothorax on x-ray and placement verified by x-ray   Patient tolerance of procedure:  Tolerated well, no immediate complications Comments:            Noemi Chapel, MD 08/24/18 [phone]

## 2018-08-23 NOTE — Progress Notes (Signed)
Pharmacy Antibiotic Note  [name] is a 63 y.o. male admitted on 08/23/2018 with concerns for aspiration pneumonia .  Pharmacy has been consulted for Unasyn dosing. Patient presented for altered mental status and hypothermic (90.3 F), found to have blood on/near mouth. Has had dark emesis while in the ED, concerns for aspiration. Patient normothermic with bear hugger. WBC 27.2>21.7. Scr greatly above baseline on admission 5.36 down to 5.05, baseline 1.4. Estimated CrCl ~15 mL/min.  Plan: Unasyn 3g IV q12h Monitor clinical status, renal function, cultures, and length of therapy.   Temp (24hrs), Avg:93.6 F (34.2 C), Min:89.6 F (32 C), Max:98.4 F (36.9 C)  Recent Labs  Lab 08/23/18 1055 08/23/18 1100 08/23/18 1413  WBC  --  27.2* 21.7*  CREATININE  --  5.36* 5.05*  LATICACIDVEN 5.0*  --   --     Estimated Creatinine Clearance: 15 mL/min (A) (by C-G formula based on SCr of 5.05 mg/dL (H)).    Allergies  Allergen Reactions  . Codeine Itching    Tolerates hydrocodone/apap    Antimicrobials this admission: Unasyn 5/10 >>  Dose adjustments this admission:   Microbiology results: 5/10 covid: negative 5/10 BCx: 5/10 TA:  Thank you for allowing pharmacy to be a part of this patient's care.  Claiborne Billings, PharmD PGY2 Cardiology Pharmacy Resident Please check AMION for all Pharmacist numbers by unit 08/23/2018 4:55 PM

## 2018-08-23 NOTE — Progress Notes (Signed)
CRITICAL VALUE ALERT  Critical Value:  Lactic acid 6.2  Date & Time Notied:  08/23/18  2205  Provider Notified: Warren Lacy  Orders Received/Actions taken:

## 2018-08-23 NOTE — Procedures (Signed)
Arterial Catheter Insertion Procedure Note KION HUNTSBERRY [id number] 1955-05-25  Procedure: Insertion of Arterial Catheter  Indications: Blood pressure monitoring and Frequent blood sampling  Procedure Details Consent: Unable to obtain consent because of emergent medical necessity. Time Out: Verified patient identification, verified procedure, site/side was marked, verified correct patient position, special equipment/implants available, medications/allergies/relevent history reviewed, required imaging and test results available.  Performed  Maximum sterile technique was used including antiseptics, cap, gloves, gown, hand hygiene, mask and sheet. Skin prep: Chlorhexidine; local anesthetic administered 20 gauge catheter was inserted into right radial artery using the Seldinger technique. ULTRASOUND GUIDANCE USED: NO Evaluation Blood flow good; BP tracing good. Complications: No apparent complications.   Dimple Nanas 08/23/2018

## 2018-08-23 NOTE — ED Notes (Signed)
20 etomidate and 100 of

## 2018-08-23 NOTE — ED Notes (Signed)
ED TO INPATIENT HANDOFF REPORT  ED Nurse Name and Phone #: [phone]  S Name/Age/Gender [name] 63 y.o. male Room/Bed: 021C/021C  Code Status   Code Status: Full Code  Home/SNF/Other Home Patient oriented to: self Is this baseline? No   Triage Complete: Triage complete  Chief Complaint AMS  Triage Note Per EMS- pt coming from home where he lives with his son. Home was in poor condition. Pt was found this morning with a mattress on top of him on the floor. Pt has bloody emesis to mouth. Pt son reported he has been talking gibberish for 2 days. Pt 70s on room air, arrives on NRB. Pt BP 70s. IO placed by EMS to right shoulder.    Allergies Allergies  Allergen Reactions  . Codeine Itching    Tolerates hydrocodone/apap    Level of Care/Admitting Diagnosis ED Disposition    ED Disposition Condition [facility] Area: [place] [100100]  Level of Care: ICU [6]  Covid Evaluation: N/A  Diagnosis: DKA (diabetic ketoacidoses) ([place]) [762263]  Admitting Physician: Brand Males [phone]  Attending Physician: Brand Males [phone]  Estimated length of stay: 5 - 7 days  Certification:: I certify this patient will need inpatient services for at least 2 midnights  PT Class (Do Not Modify): Inpatient [101]  PT Acc Code (Do Not Modify): Private [1]       B Medical/Surgery History Past Medical History:  Diagnosis Date  . DEPRESSION   . DIABETES MELLITUS, TYPE I   . DRUG ABUSE    pt should have NO controlled substances rx'ed  . Glaucoma   . HEPATITIS C    chronic  . Hypertension 02/18/2011  . 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 Vitiligo    Past Surgical History:  Procedure Laterality Date  . ABDOMINAL AORTOGRAM W/LOWER EXTREMITY N/A 07/25/2016   Procedure: Abdominal Aortogram w/Bilateral Lower Extremity Runoff;  Surgeon: Conrad Morenci, MD;  Location: [facility];  Service: Cardiovascular;  Laterality: N/A;  .  ABDOMINAL AORTOGRAM W/LOWER EXTREMITY N/A 12/24/2016   Procedure: ABDOMINAL AORTOGRAM W/LOWER EXTREMITY;  Surgeon: Serafina Mitchell, MD;  Location: [facility];  Service: Cardiovascular;  Laterality: N/A;  . AMPUTATION TOE Right 07/26/2016   Procedure: AMPUTATION GREAT TOE;  Surgeon: Serafina Mitchell, MD;  Location: [facility];  Service: Vascular;  Laterality: Right;  . EYE SURGERY     retinal surgery x 2, right eye  . INSERTION OF ILIAC STENT Right 07/26/2016   Procedure: INSERTION OF RIGHT POPLITEAL STENT WITH BALLOON ANGIOPLASTY;  Surgeon: Serafina Mitchell, MD;  Location: [facility];  Service: Vascular;  Laterality: Right;  . LOWER EXTREMITY ANGIOGRAM Right 07/26/2016   Procedure: LOWER EXTREMITY ANGIOGRAM;  Surgeon: Serafina Mitchell, MD;  Location: [facility];  Service: Vascular;  Laterality: Right;  . PERIPHERAL VASCULAR BALLOON ANGIOPLASTY Right 07/26/2016   Procedure: PERIPHERAL VASCULAR BALLOON ANGIOPLASTY  RIGHT ANTERIOR TIBEAL ARTERY AND RIGHT SUPERFICIAL FEMORAL ARTERY;  Surgeon: Serafina Mitchell, MD;  Location: [facility];  Service: Vascular;  Laterality: Right;  . PERIPHERAL VASCULAR INTERVENTION Right 12/24/2016   Procedure: PERIPHERAL VASCULAR INTERVENTION;  Surgeon: Serafina Mitchell, MD;  Location: [facility];  Service: Cardiovascular;  Laterality: Right;  lower extr     A IV Location/Drains/Wounds Patient Lines/Drains/Airways Status   Active Line/Drains/Airways    Name:   Placement date:   Placement time:   Site:   Days:   Peripheral IV 08/23/18  Right Antecubital   08/23/18    1050    Antecubital   less than 1   Peripheral IV 08/23/18 Left Wrist   08/23/18    1050    Wrist   less than 1   Urethral Catheter Treniece Holsclaw RN Temperature probe;Latex;Straight-tip 16 Fr.   08/23/18    1158    Temperature probe;Latex;Straight-tip   less than 1   Airway 7.5 mm   08/23/18    1415     less than 1          Intake/Output Last 24 hours  Intake/Output Summary (Last 24 hours) at 08/23/2018 1618 Last  data filed at 08/23/2018 1249 Gross per 24 hour  Intake 1950 ml  Output -  Net 1950 ml    Labs/Imaging Results for orders placed or performed during the hospital encounter of 08/23/18 (from the past 48 hour(s))  CBG monitoring, ED     Status: Abnormal   Collection Time: 08/23/18 10:45 AM  Result Value Ref Range   Glucose-Capillary >600 (HH) 70 - 99 mg/dL  Urinalysis, Routine w reflex microscopic     Status: Abnormal   Collection Time: 08/23/18 10:55 AM  Result Value Ref Range   Color, Urine YELLOW (A) YELLOW    Comment: BIOCHEMICALS MAY BE AFFECTED BY COLOR CORRECTED ON 05/10 AT 1242: PREVIOUSLY REPORTED AS YELLOW    APPearance CLEAR (A) CLEAR   Specific Gravity, Urine 1.010 1.005 - 1.030   pH 5.0 5.0 - 8.0   Glucose, UA >1,000 (A) NEGATIVE mg/dL   Hgb urine dipstick NEGATIVE NEGATIVE   Bilirubin Urine NEGATIVE NEGATIVE   Ketones, ur 15 (A) NEGATIVE mg/dL   Protein, ur 30 (A) NEGATIVE mg/dL   Nitrite NEGATIVE NEGATIVE   Leukocytes,Ua NEGATIVE NEGATIVE    Comment: Performed at [facility], [place] [address] [place], [place] [address]  Ammonia     Status: Abnormal   Collection Time: 08/23/18 10:55 AM  Result Value Ref Range   Ammonia 155 (H) 9 - 35 umol/L    Comment: Performed at [facility], [place] [address] [place], [place] [address]  Salicylate level     Status: None   Collection Time: 08/23/18 10:55 AM  Result Value Ref Range   Salicylate Lvl <4.0 2.8 - 30.0 mg/dL    Comment: Performed at [facility] [address] [place], [place] [address]  Acetaminophen level     Status: Abnormal   Collection Time: 08/23/18 10:55 AM  Result Value Ref Range   Acetaminophen (Tylenol), Serum <10 (L) 10 - 30 ug/mL    Comment: (NOTE) Therapeutic concentrations vary significantly. A range of 10-30 ug/mL  may be an effective concentration for many patients. However, some  are best treated at concentrations outside of this range. Acetaminophen concentrations >150 ug/mL  at 4 hours after ingestion  and >50 ug/mL at 12 hours after ingestion are often associated with  toxic reactions. Performed at [facility], [place] [address] [place], [place] [address]   Lactic acid, plasma     Status: Abnormal   Collection Time: 08/23/18 10:55 AM  Result Value Ref Range   Lactic Acid, Venous 5.0 (HH) 0.5 - 1.9 mmol/L    Comment: CRITICAL RESULT CALLED TO, READ BACK BY AND VERIFIED WITH: Elwin Mocha RN AT 1218 08/23/2018 BY Karie Chimera Performed at [facility], [address], [place], [place] [address]   SARS Coronavirus 2 [facility] order, Performed in [facility] hospital  lab)     Status: None   Collection Time: 08/23/18 10:55 AM  Result Value Ref Range   SARS Coronavirus 2 NEGATIVE NEGATIVE    Comment: (NOTE) If result is NEGATIVE SARS-CoV-2 target nucleic acids are NOT DETECTED. The SARS-CoV-2 RNA is generally detectable in upper and lower  respiratory specimens during the acute phase of infection. The lowest  concentration of SARS-CoV-2 viral copies this assay can detect is 250  copies / mL. A negative result does not preclude SARS-CoV-2 infection  and should not be used as the sole basis for treatment or other  patient management decisions.  A negative result may occur with  improper specimen collection / handling, submission of specimen other  than nasopharyngeal swab, presence of viral mutation(s) within the  areas targeted by this assay, and inadequate number of viral copies  (<250 copies / mL). A negative result must be combined with clinical  observations, patient history, and epidemiological information. If result is POSITIVE SARS-CoV-2 target nucleic acids are DETECTED. The SARS-CoV-2 RNA is generally detectable in upper and lower  respiratory specimens dur ing the acute phase of infection.  Positive  results are indicative of active infection with SARS-CoV-2.  Clinical  correlation with patient history and other diagnostic  information is  necessary to determine patient infection status.  Positive results do  not rule out bacterial infection or co-infection with other viruses. If result is PRESUMPTIVE POSTIVE SARS-CoV-2 nucleic acids MAY BE PRESENT.   A presumptive positive result was obtained on the submitted specimen  and confirmed on repeat testing.  While 2019 novel coronavirus  (SARS-CoV-2) nucleic acids may be present in the submitted sample  additional confirmatory testing may be necessary for epidemiological  and / or clinical management purposes  to differentiate between  SARS-CoV-2 and other Sarbecovirus currently known to infect humans.  If clinically indicated additional testing with an alternate test  methodology [phone]) is advised. The SARS-CoV-2 RNA is generally  detectable in upper and lower respiratory sp ecimens during the acute  phase of infection. The expected result is Negative. Fact Sheet for Patients:  StrictlyIdeas.no Fact Sheet for Healthcare Providers: BankingDealers.co.za This test is not yet approved or cleared by the [place] FDA and has been authorized for detection and/or diagnosis of SARS-CoV-2 by FDA under an Emergency Use Authorization (EUA).  This EUA will remain in effect (meaning this test can be used) for the duration of the COVID-19 declaration under Section 564(b)(1) of the Act, 21 U.S.C. section 360bbb-3(b)(1), unless the authorization is terminated or revoked sooner. Performed at [facility], [place] [address], [place], [place] [address]   Urinalysis, Microscopic (reflex)     Status: Abnormal   Collection Time: 08/23/18 10:55 AM  Result Value Ref Range   RBC / HPF NONE SEEN 0 - 5 RBC/hpf   WBC, UA NONE SEEN 0 - 5 WBC/hpf   Bacteria, UA FEW (A) NONE SEEN   Squamous Epithelial / LPF 0-5 0 - 5    Comment: Performed at [facility], [place] [address] [place], [place] [address]  Comprehensive metabolic  panel     Status: Abnormal   Collection Time: 08/23/18 11:00 AM  Result Value Ref Range   Sodium 134 (L) 135 - 145 mmol/L   Potassium 6.4 (HH) 3.5 - 5.1 mmol/L    Comment: NO VISIBLE HEMOLYSIS CRITICAL RESULT CALLED TO, READ BACK BY AND VERIFIED WITH: Elwin Mocha RN AT 1218 08/23/2018 BY WOOLLENK    Chloride 76 (  L) 98 - 111 mmol/L   CO2 12 (L) 22 - 32 mmol/L   Glucose, Bld >2,500 (HH) 70 - 99 mg/dL    Comment: CRITICAL RESULT CALLED TO, READ BACK BY AND VERIFIED WITH: Elwin Mocha RN AT 1218 08/23/2018 BY WOOLLENK RESULTS CONFIRMED BY MANUAL DILUTION    BUN 107 (H) 8 - 23 mg/dL   Creatinine, Ser 5.36 (H) 0.61 - 1.24 mg/dL   Calcium 8.6 (L) 8.9 - 10.3 mg/dL   Total Protein 6.2 (L) 6.5 - 8.1 g/dL   Albumin 2.9 (L) 3.5 - 5.0 g/dL   AST 22 15 - 41 U/L   ALT 26 0 - 44 U/L   Alkaline Phosphatase 146 (H) 38 - 126 U/L   Total Bilirubin 1.3 (H) 0.3 - 1.2 mg/dL   GFR calc non Af Amer 11 (L) >60 mL/min   GFR calc Af Amer 12 (L) >60 mL/min   Anion gap 46 (H) 5 - 15    Comment: REPEATED TO VERIFY Performed at [facility], [address], [place], [place] [address]   Troponin I - ONCE - STAT     Status: Abnormal   Collection Time: 08/23/18 11:00 AM  Result Value Ref Range   Troponin I 0.06 (HH) <0.03 ng/mL    Comment: CRITICAL RESULT CALLED TO, READ BACK BY AND VERIFIED WITH: Elwin Mocha RN AT 1218 08/23/2018 BY Karie Chimera Performed at [facility], [place] [address] [place], [place] [address]   Lipase, blood     Status: None   Collection Time: 08/23/18 11:00 AM  Result Value Ref Range   Lipase 17 11 - 51 U/L    Comment: Performed at [facility] [address] [place], [place] [address]  Magnesium     Status: Abnormal   Collection Time: 08/23/18 11:00 AM  Result Value Ref Range   Magnesium 4.1 (H) 1.7 - 2.4 mg/dL    Comment: Performed at [facility] [address] [place], [place] [address]  CK     Status: None   Collection Time:  08/23/18 11:00 AM  Result Value Ref Range   Total CK 181 49 - 397 U/L    Comment: Performed at [facility], [place] [address], [place], [place] [address]  Protime-INR     Status: None   Collection Time: 08/23/18 11:00 AM  Result Value Ref Range   Prothrombin Time 13.0 11.4 - 15.2 seconds   INR 1.0 0.8 - 1.2    Comment: (NOTE) INR goal varies based on device and disease states. Performed at [facility], [place] [address] [place], [place] [address]   CBC with Differential/Platelet     Status: Abnormal   Collection Time: 08/23/18 11:00 AM  Result Value Ref Range   WBC 27.2 (H) 4.0 - 10.5 K/uL   RBC 3.87 (L) 4.22 - 5.81 MIL/uL   Hemoglobin 11.3 (L) 13.0 - 17.0 g/dL   HCT 41.4 39.0 - 52.0 %   MCV 107.0 (H) 80.0 - 100.0 fL   MCH 29.2 26.0 - 34.0 pg   MCHC 27.3 (L) 30.0 - 36.0 g/dL   RDW 14.2 11.5 - 15.5 %   Platelets 452 (H) 150 - 400 K/uL   nRBC 0.0 0.0 - 0.2 %   Neutrophils Relative % 81 %   Neutro Abs 22.0 (H) 1.7 - 7.7 K/uL   Lymphocytes Relative 6 %   Lymphs Abs 1.7 0.7 - 4.0 K/uL  Monocytes Relative 7 %   Monocytes Absolute 1.8 (H) 0.1 - 1.0 K/uL   Eosinophils Relative 0 %   Eosinophils Absolute 0.1 0.0 - 0.5 K/uL   Basophils Relative 1 %   Basophils Absolute 0.2 (H) 0.0 - 0.1 K/uL   WBC Morphology MILD LEFT SHIFT (1-5% METAS, OCC MYELO, OCC BANDS)     Comment: VACUOLATED NEUTROPHILS   Immature Granulocytes 5 %   Abs Immature Granulocytes 1.42 (H) 0.00 - 0.07 K/uL    Comment: Performed at [facility] [address], [place], [place] [address]  POCT I-Stat EG7     Status: Abnormal   Collection Time: 08/23/18 11:02 AM  Result Value Ref Range   pH, Ven 7.151 (LL) 7.250 - 7.430   pCO2, Ven 25.6 (L) 44.0 - 60.0 mmHg   pO2, Ven 59.0 (H) 32.0 - 45.0 mmHg   Bicarbonate 8.9 (L) 20.0 - 28.0 mmol/L   TCO2 10 (L) 22 - 32 mmol/L   O2 Saturation 83.0 %   Acid-base deficit 18.0 (H) 0.0 - 2.0 mmol/L   Sodium 126 (L) 135 - 145 mmol/L   Potassium 5.9 (H) 3.5 - 5.1  mmol/L   Calcium, Ion 0.83 (LL) 1.15 - 1.40 mmol/L   HCT 35.0 (L) 39.0 - 52.0 %   Hemoglobin 11.9 (L) 13.0 - 17.0 g/dL   Patient temperature HIDE    Sample type VENOUS    Comment NOTIFIED PHYSICIAN   CBG monitoring, ED     Status: Abnormal   Collection Time: 08/23/18 12:26 PM  Result Value Ref Range   Glucose-Capillary >600 (HH) 70 - 99 mg/dL  CBG monitoring, ED     Status: Abnormal   Collection Time: 08/23/18  1:33 PM  Result Value Ref Range   Glucose-Capillary >600 (HH) 70 - 99 mg/dL  CBC with Differential     Status: Abnormal   Collection Time: 08/23/18  2:13 PM  Result Value Ref Range   WBC 21.7 (H) 4.0 - 10.5 K/uL   RBC 3.55 (L) 4.22 - 5.81 MIL/uL   Hemoglobin 10.4 (L) 13.0 - 17.0 g/dL   HCT 34.7 (L) 39.0 - 52.0 %   MCV 97.7 80.0 - 100.0 fL    Comment: REPEATED TO VERIFY DELTA CHECK NOTED DUE TO CHANGE IN GLUCOSE    MCH 29.3 26.0 - 34.0 pg   MCHC 30.0 30.0 - 36.0 g/dL   RDW 14.0 11.5 - 15.5 %   Platelets 363 150 - 400 K/uL   nRBC 0.0 0.0 - 0.2 %   Neutrophils Relative % 89 %   Neutro Abs 19.2 (H) 1.7 - 7.7 K/uL   Lymphocytes Relative 6 %   Lymphs Abs 1.4 0.7 - 4.0 K/uL   Monocytes Relative 2 %   Monocytes Absolute 0.4 0.1 - 1.0 K/uL   Eosinophils Relative 0 %   Eosinophils Absolute 0.0 0.0 - 0.5 K/uL   Basophils Relative 0 %   Basophils Absolute 0.1 0.0 - 0.1 K/uL   Immature Granulocytes 3 %   Abs Immature Granulocytes 0.63 (H) 0.00 - 0.07 K/uL    Comment: Performed at [facility], [place] [address], [place], [place] [address]  Comprehensive metabolic panel     Status: Abnormal   Collection Time: 08/23/18  2:13 PM  Result Value Ref Range   Sodium 138 135 - 145 mmol/L    Comment: DELTA CHECK NOTED   Potassium 4.0 3.5 - 5.1 mmol/L    Comment: DELTA CHECK NOTED  Chloride 92 (L) 98 - 111 mmol/L   CO2 11 (L) 22 - 32 mmol/L   Glucose, Bld 1,191 (HH) 70 - 99 mg/dL    Comment: CRITICAL RESULT CALLED TO, READ BACK BY AND VERIFIED WITH: Elwin Mocha RN AT  9798 08/23/2018 BY WOOLLENK    BUN 105 (H) 8 - 23 mg/dL   Creatinine, Ser 5.05 (H) 0.61 - 1.24 mg/dL   Calcium 7.4 (L) 8.9 - 10.3 mg/dL   Total Protein 5.3 (L) 6.5 - 8.1 g/dL   Albumin 2.5 (L) 3.5 - 5.0 g/dL   AST 26 15 - 41 U/L   ALT 24 0 - 44 U/L   Alkaline Phosphatase 133 (H) 38 - 126 U/L   Total Bilirubin 1.5 (H) 0.3 - 1.2 mg/dL   GFR calc non Af Amer 11 (L) >60 mL/min   GFR calc Af Amer 13 (L) >60 mL/min   Anion gap 35 (H) 5 - 15    Comment: REPEATED TO VERIFY Performed at [facility], [address] [place] [address], [place], [place] [address]   CBG monitoring, ED     Status: Abnormal   Collection Time: 08/23/18  3:14 PM  Result Value Ref Range   Glucose-Capillary >600 (HH) 70 - 99 mg/dL  I-STAT 7, (LYTES, BLD GAS, ICA, H+H)     Status: Abnormal   Collection Time: 08/23/18  3:47 PM  Result Value Ref Range   pH, Arterial 7.161 (LL) 7.350 - 7.450   pCO2 arterial 39.1 32.0 - 48.0 mmHg   pO2, Arterial 176.0 (H) 83.0 - 108.0 mmHg   Bicarbonate 14.2 (L) 20.0 - 28.0 mmol/L   TCO2 15 (L) 22 - 32 mmol/L   O2 Saturation 99.0 %   Acid-base deficit 14.0 (H) 0.0 - 2.0 mmol/L   Sodium 139 135 - 145 mmol/L   Potassium 3.9 3.5 - 5.1 mmol/L   Calcium, Ion 1.11 (L) 1.15 - 1.40 mmol/L   HCT 31.0 (L) 39.0 - 52.0 %   Hemoglobin 10.5 (L) 13.0 - 17.0 g/dL   Patient temperature 96.3 F    Collection site ARTERIAL LINE    Drawn by Operator    Sample type ARTERIAL    Comment NOTIFIED PHYSICIAN    Dg Chest 1 View  Result Date: 08/23/2018 CLINICAL DATA:  Central line placement. EXAM: CHEST  1 VIEW COMPARISON:  Radiograph of same day. FINDINGS: The heart size and mediastinal contours are within normal limits. No pneumothorax or pleural effusion is noted. Stable position of right internal jugular catheter with distal tip in expected position of cavoatrial junction. Endotracheal tube is unchanged in position. Both lungs are clear. The visualized skeletal structures are unremarkable. IMPRESSION: Stable  support apparatus. No acute cardiopulmonary abnormality seen. Electronically Signed   By: Marijo Conception M.D.   On: 08/23/2018 15:51   Ct Head Wo Contrast  Result Date: 08/23/2018 CLINICAL DATA:  Unwitnessed fall. EXAM: CT HEAD WITHOUT CONTRAST CT CERVICAL SPINE WITHOUT CONTRAST TECHNIQUE: Multidetector CT imaging of the head and cervical spine was performed following the standard protocol without intravenous contrast. Multiplanar CT image reconstructions of the cervical spine were also generated. COMPARISON:  CT scan of February 03, 2018. FINDINGS: CT HEAD FINDINGS Brain: Mild diffuse cortical atrophy is noted. Mild chronic ischemic white matter disease is noted. Stable small calcified right frontal meningioma. No mass effect or midline shift is noted. There is no evidence of hemorrhage or acute infarction. Vascular: No hyperdense vessel or unexpected calcification. Skull: Normal. Negative for fracture  or focal lesion. Sinuses/Orbits: No acute finding. Other: None. CT CERVICAL SPINE FINDINGS Alignment: Normal. Skull base and vertebrae: No acute fracture. No primary bone lesion or focal pathologic process. Soft tissues and spinal canal: No prevertebral fluid or swelling. No visible canal hematoma. Disc levels: Severe degenerative disc disease is noted at C6-7 with anterior and posterior osteophyte formation. Upper chest: Negative. Other: None. IMPRESSION: Mild diffuse cortical atrophy. Mild chronic ischemic white matter disease. No acute intracranial abnormality seen. Severe degenerative disc disease is noted at C6-7. No acute abnormality seen in the cervical spine. Electronically Signed   By: Marijo Conception M.D.   On: 08/23/2018 13:35   Ct Cervical Spine Wo Contrast  Result Date: 08/23/2018 CLINICAL DATA:  Unwitnessed fall. EXAM: CT HEAD WITHOUT CONTRAST CT CERVICAL SPINE WITHOUT CONTRAST TECHNIQUE: Multidetector CT imaging of the head and cervical spine was performed following the standard protocol  without intravenous contrast. Multiplanar CT image reconstructions of the cervical spine were also generated. COMPARISON:  CT scan of February 03, 2018. FINDINGS: CT HEAD FINDINGS Brain: Mild diffuse cortical atrophy is noted. Mild chronic ischemic white matter disease is noted. Stable small calcified right frontal meningioma. No mass effect or midline shift is noted. There is no evidence of hemorrhage or acute infarction. Vascular: No hyperdense vessel or unexpected calcification. Skull: Normal. Negative for fracture or focal lesion. Sinuses/Orbits: No acute finding. Other: None. CT CERVICAL SPINE FINDINGS Alignment: Normal. Skull base and vertebrae: No acute fracture. No primary bone lesion or focal pathologic process. Soft tissues and spinal canal: No prevertebral fluid or swelling. No visible canal hematoma. Disc levels: Severe degenerative disc disease is noted at C6-7 with anterior and posterior osteophyte formation. Upper chest: Negative. Other: None. IMPRESSION: Mild diffuse cortical atrophy. Mild chronic ischemic white matter disease. No acute intracranial abnormality seen. Severe degenerative disc disease is noted at C6-7. No acute abnormality seen in the cervical spine. Electronically Signed   By: Marijo Conception M.D.   On: 08/23/2018 13:35   Dg Chest Port 1 View  Result Date: 08/23/2018 CLINICAL DATA:  Status post intubation EXAM: PORTABLE CHEST 1 VIEW COMPARISON:  08/23/2018 FINDINGS: Cardiac shadow is stable. Right jugular central line and endotracheal tube are noted in satisfactory position. No pneumothorax is seen. No focal infiltrate is noted. IMPRESSION: Tubes and lines as described above. No significant interval changes noted. Electronically Signed   By: Inez Catalina M.D.   On: 08/23/2018 15:08   Dg Chest Port 1 View  Result Date: 08/23/2018 CLINICAL DATA:  Altered mental status and possible aspiration. EXAM: PORTABLE CHEST 1 VIEW COMPARISON:  08/12/2018 and prior radiographs FINDINGS: The  cardiomediastinal silhouette is unremarkable. There is no evidence of focal airspace disease, pulmonary edema, suspicious pulmonary nodule/mass, pleural effusion, or pneumothorax. No acute bony abnormalities are identified. Remote LEFT rib fractures again noted. IMPRESSION: No active disease. Electronically Signed   By: Margarette Canada M.D.   On: 08/23/2018 11:20    Pending Labs Unresulted Labs (From admission, onward)    Start     Ordered   08/24/18 0500  CBC  Tomorrow morning,   R     08/23/18 1604   08/24/18 7654  Basic metabolic panel  Tomorrow morning,   R     08/23/18 1604   08/24/18 0500  Blood gas, arterial  Tomorrow morning,   R     08/23/18 1604   08/24/18 0500  Magnesium  Tomorrow morning,   R     08/23/18 1604  08/24/18 0500  Phosphorus  Tomorrow morning,   R     08/23/18 1604   08/23/18 1552  Culture, respiratory (tracheal aspirate)  Once,   R    Question:  Patient immune status  Answer:  Normal   08/23/18 1604   08/23/18 1551  Procalcitonin  Once,   R     08/23/18 1604   08/23/18 1545  Blood gas, arterial  Once,   R     08/23/18 1457   08/23/18 1447  Type and screen [place]  Once,   STAT    Comments:  [place]    08/23/18 1446   08/23/18 1121  SARS Coronavirus 2 (CEPHEID - Performed in [place] hospital lab), Hosp Order  (Asymptomatic Patients Labs)  Once,   R    Question:  Rule Out  Answer:  Yes   08/23/18 1120   08/23/18 1047  Culture, blood (Routine X 2) w Reflex to ID Panel  BLOOD CULTURE X 2,   STAT     08/23/18 1047   08/23/18 1045  Occult bld gastric/duodenum (cup to lab)  ONCE - STAT,   STAT     08/23/18 1047          Vitals/Pain Today's Vitals   08/23/18 1515 08/23/18 1530 08/23/18 1545 08/23/18 1600  BP: (!) 118/57 (!) 105/52 (!) 106/49 (!) 97/55  Pulse: 89 92 89 92  Resp: 14 14 14 14   Temp: (!) 95.6 F (35.3 C) (!) 96 F (35.6 C) (!) 96.3 F (35.7 C) (!) 96.8 F (36 C)  TempSrc:      SpO2: 100% 100%  100% 95%    Isolation Precautions No active isolations  Medications Medications  dextrose 5 %-0.45 % sodium chloride infusion ( Intravenous Hold 08/23/18 1204)  insulin regular, human (MYXREDLIN) 100 units/ 100 mL infusion (5.4 Units/hr Intravenous New Bag/Given 08/23/18 1132)  famotidine (PEPCID) IVPB 20 mg premix (has no administration in time range)  pantoprazole (PROTONIX) 80 mg in sodium chloride 0.9 % 100 mL IVPB (has no administration in time range)  octreotide (SANDOSTATIN) 2 mcg/mL load via infusion 50 mcg (has no administration in time range)    And  octreotide (SANDOSTATIN) 500 mcg in sodium chloride 0.9 % 250 mL (2 mcg/mL) infusion (has no administration in time range)  norepinephrine (LEVOPHED) 4mg  in 24mL premix infusion (2 mcg/min Intravenous New Bag/Given 08/23/18 1408)  propofol (DIPRIVAN) 1000 MG/100ML infusion (5 mcg/kg/min  68.9 kg Intravenous New Bag/Given 08/23/18 1429)  0.9 %  sodium chloride infusion (has no administration in time range)  sodium chloride 0.9 % bolus 1,000 mL (0 mLs Intravenous Stopped 08/23/18 1249)  sodium chloride 0.9 % bolus 2,067 mL (0 mL/kg  68.9 kg Intravenous Stopped 08/23/18 1249)  metoCLOPramide (REGLAN) injection 10 mg (10 mg Intravenous Given 08/23/18 1335)    Mobility non-ambulatory High fall risk   Focused Assessments    R Recommendations: See Admitting Provider Note  Report given to:   Additional Notes:

## 2018-08-23 NOTE — ED Triage Notes (Signed)
Per EMS- pt coming from home where he lives with his son. Home was in poor condition. Pt was found this morning with a mattress on top of him on the floor. Pt has bloody emesis to mouth. Pt son reported he has been talking gibberish for 2 days. Pt 70s on room air, arrives on NRB. Pt BP 70s. IO placed by EMS to right shoulder.

## 2018-08-23 NOTE — H&P (Signed)
NAME:  [name], MRN:  [id number], DOB:  26-Jul-1955, LOS: 0 ADMISSION DATE:  08/23/2018, CONSULTATION DATE:  08/23/2018 REFERRING MD:  ED Physician, CHIEF COMPLAINT:  AMS/ Respiratory Failure   Brief History   All History obtained from medical records as patient is intubated and unresponsive upon evaluation. [name] a 63 y.o.malecurrent every day with medical history significant oftype I diabetes with poor control, hypertension, chronic hepatitis C, vitiligo, polysubstance abuse, depression, medication noncompliance, hyperlipidemia who presented to the ER brought in by EMS secondary to blood sugar of 2500. He lives at home with his son and was found on the floor this morning with a mattress on top of him. Per his son he has been " talking gibberish" for 2 days. EMS found patient with Sats of 70% on RA, and BP in 70's . EMS placed a R shoulder IO. Pt. Was hypothermic upon arrival to the ED and required intubation by the ED MD. PCCM have been consulted to admit patient and manage care.  History of present illness   [name] a 63 y.o.malecurrent every day with medical history significant oftype I diabetes with poor control, hypertension, chronic hepatitis C, vitiligo, polysubstance abuse, depression, medication noncompliance, hyperlipidemia who presented to the ER brought in by EMS secondary to blood sugar of 2500, severe diabetic ketoacidosis and hypothermia with rectal temp of 90.3 F . He lives at home with his son and was found on the floor this morning with a mattress on top of him. Unknown down time. Per his son he has been " talking gibberish" for 2 days. EMS found patient with Sats of 70% on RA, and BP in 70's . EMS placed a R shoulder IO. IVF were initiated. He improved initially with IV fluids.Pt. Was hypothermic ( 90.3) upon arrival to the ED and Hypoxemic requiring  intubation by the ED MD. PCCM have been consulted to admit patient and manage care.  In the ED patient was  found in DKA, Serum Glucose of > 2500 with anion gap of 35, Venous ABG revealed Ph of 7.45, CO2 of 50, PO2 of 119, bicarb of 37. WBC was 21,000, CXR showed no active disease.  Lactate was 5, Creatinine was 5.05, CT Head and cervical spine were both negative for acute process, however patient vomited coffee ground emesis after CT was performed . He was started on octreotide and Protonix.He desaturated and was placed on NRB prior to intubation.[name] KitchenHe was intubated to protect his airway. ABG is pending. Right IJ CVC was placed by ED MD. He became hypotensive, he was bolused with 2 L IVF  and was started on Levophed. Ammonia Level was 155, Initial Potassium was 6.4,  Corrected Na  170-190 He was started on an insulin gtt, IVF, and Propofol. Tox screens were + for Cocaine and Mariajuana. PCCM was consulted to admit and manage patient care.    Past Medical History   Past Medical History:  Diagnosis Date  . DEPRESSION   . DIABETES MELLITUS, TYPE I   . DRUG ABUSE    pt should have NO controlled substances rx'ed  . Glaucoma   . HEPATITIS C    chronic  . Hypertension 02/18/2011  . 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 [facility] Events   Admission 5/10 Recent Admission 4/28-5/2>> Persistent hypoglycemia/uncontrolled diabetes mellitus Consults:    Procedures:  5/10 IO R shoulder 5/10 A line   Significant Diagnostic Tests:  5/10 CT Cervical Spine/ Head  IMPRESSION: Mild diffuse cortical atrophy. Mild chronic ischemic white matter disease.  No acute intracranial abnormality seen. Severe degenerative disc disease is noted at C6-7.  No acute abnormality seen in the cervical spine.  01/2018 Echo Left ventricle: The cavity size was normal. Aldava thickness was   normal. Systolic function was normal. The estimated ejection   fraction was in the range of 55% to 60%. Honse motion was normal;   there were no regional Fetting motion abnormalities. Doppler   parameters are  consistent with abnormal left ventricular   relaxation (grade 1 diastolic dysfunction). The E/e&' ratio is   >15, suggesting elevated LV filling pressure. - Mitral valve: Calcified annulus. Mildly thickened leaflets .   There was trivial regurgitation. - Left atrium: The atrium was normal in size. - Inferior vena cava: The vessel was normal in size. The   respirophasic diameter changes were in the normal range (>= 50%),   consistent with normal central venous pressure.  Impressions:  - Compared to a prior study in 2018, the LVEF is lower, but normal   at 55-60%, now with grade 1 DD and elevated LV filling pressure.   Micro Data:  5/10>> COVID 19>> Negative 5/10>>Blood Cx x 2>> 5/10 Tracheal Aspirate>>   Antimicrobials:   5/10>> Unasyn  Interim history/subjective:  Pt. Is currently on Bear Hugger , temp is 96.3 Remains on Levo at 10 ABG is pending after intubation  Objective   Blood pressure (!) 137/55, pulse 92, temperature (!) 94.7 F (34.8 C), resp. rate 16, SpO2 100 %.        Intake/Output Summary (Last 24 hours) at 08/23/2018 1447 Last data filed at 08/23/2018 1249 Gross per 24 hour  Intake 1950 ml  Output -  Net 1950 ml   There were no vitals filed for this visit.  Examination: General: Sedated and intubated unkept , frail appearing elderly male  HENT: Coffee ground emesis around mouth, NCAT,ETT is secure Lungs: Bilateral Chest excursion, Clear upon auscultation Cardiovascular: S1, S2, RRR No MRG, NSR per Tele Abdomen: Flat, Slightly distended, BS diminished Extremities:No obvious deformity, Warm to touch with brisk refill per nailbeds Neuro: Sedated and intubated, MAE x 4, Follows no commands GU: Foley cath  Resolved Hospital Problem list     Assessment & Plan:  Severe Diabetic Keto Acidosis, Initial anion gap > 46 Severe Hyperglycemia initial serum glucose 2500 Dehydration  Plan: IVF Bolus 4 L LR ABG now Insulin gtt CBG Q 1 BMET Q 4 x 6 DKA  protocol Consider HCO3 gtt based on ABG result  Acute Respiratory Failure Intubated for Airway Protection ? Aspiration after emesis CXR without acute process Plan ABG now Titrate oxygen and PEEP  As able No weaning 5/10 SBT in am 5/11 CXR in am 5/11 and prn Saturation Goal > 94% Unasyn for suspected aspiration   Acute Renal Failure Creatinine 5.05 on admission CK 181 Hyperkalemia Initial potassium 6.4 Hypernatremia Corrected Na 170-190 Plan IVF Trend BMET Monitor and treat and replete electrolytes as needed Daily Mag and Phos Avoid nephrotoxic medications Maintain renal perfusion Monitor UO  Hypotension Dehydration Received 2 L in ED Lactate of 5 on admission Plan Levophed gtt via CVC Map Goal > 65 Wean as able  LR Bolus x 4 L Trend Lactate   Leukocytosis WBC 21000 on admission ? Reactive  ? Aspiration  Hypothermia on admission COVID Negative Plan Trend fever and WBC Blood Cx Tracheal aspirate Cx Follow Micro Check PCT Re-culture as is clinically indicated  Coffee  Ground Emesis HGB Stable Ammonia 155 No evidence of cirrhosis per Abd CT 2019 Plan Octreotide started in ED Protonix started in ED OG tube to LIS  Trend HGB Consider GI consult if progresses Consider Lactulose if does not improve with hydration  AMS/ Encephalopathy Plan CT head negative for acute findings Consider EEG if no improvement  Neuro Checks per unit protocol   Best practice:  Diet: NPO Pain/Anxiety/Delirium protocol (if indicated): Propofol VAP protocol (if indicated): Ordered DVT prophylaxis: PAS hose GI prophylaxis: Protonix gtt Glucose control: DKA protocol Mobility: BR Code Status: Full Family Communication: Per Nursing Disposition:ICU   Labs   CBC: Recent Labs  Lab 08/23/18 1100 08/23/18 1102 08/23/18 1413  WBC 27.2*  --  21.7*  NEUTROABS 22.0*  --  19.2*  HGB 11.3* 11.9* 10.4*  HCT 41.4 35.0* 34.7*  MCV 107.0*  --  97.7  PLT 452*  --  363     Basic Metabolic Panel: Recent Labs  Lab 08/23/18 1100 08/23/18 1102  NA 134* 126*  K 6.4* 5.9*  CL 76*  --   CO2 12*  --   GLUCOSE >2,500*  --   BUN 107*  --   CREATININE 5.36*  --   CALCIUM 8.6*  --   MG 4.1*  --    GFR: Estimated Creatinine Clearance: 14.1 mL/min (A) (by C-G formula based on SCr of 5.36 mg/dL (H)). Recent Labs  Lab 08/23/18 1055 08/23/18 1100 08/23/18 1413  WBC  --  27.2* 21.7*  LATICACIDVEN 5.0*  --   --     Liver Function Tests: Recent Labs  Lab 08/23/18 1100  AST 22  ALT 26  ALKPHOS 146*  BILITOT 1.3*  PROT 6.2*  ALBUMIN 2.9*   Recent Labs  Lab 08/23/18 1100  LIPASE 17   Recent Labs  Lab 08/23/18 1055  AMMONIA 155*    ABG    Component Value Date/Time   PHART 7.475 (H) 02/04/2018 0922   PCO2ART 50.0 (H) 02/04/2018 0922   PO2ART 119.0 (H) 02/04/2018 0922   HCO3 8.9 (L) 08/23/2018 1102   TCO2 10 (L) 08/23/2018 1102   ACIDBASEDEF 18.0 (H) 08/23/2018 1102   O2SAT 83.0 08/23/2018 1102     Coagulation Profile: Recent Labs  Lab 08/23/18 1100  INR 1.0    Cardiac Enzymes: Recent Labs  Lab 08/23/18 1100  CKTOTAL 181  TROPONINI 0.06*    HbA1C: Hgb A1c MFr Bld  Date/Time Value Ref Range Status  08/13/2018 04:53 AM 9.8 (H) 4.8 - 5.6 % Final    Comment:    (NOTE) Pre diabetes:          5.7%-6.4% Diabetes:              >6.4% Glycemic control for   <7.0% adults with diabetes   01/30/2018 09:22 AM 10.1 (H) 4.8 - 5.6 % Final    Comment:    (NOTE) Pre diabetes:          5.7%-6.4% Diabetes:              >6.4% Glycemic control for   <7.0% adults with diabetes     CBG: Recent Labs  Lab 08/23/18 1045 08/23/18 1226 08/23/18 1333  GLUCAP >600* >600* >600*    Review of Systems:   Unable as patient was intubated and sedated  Past Medical History  He,  has a past medical history of DEPRESSION, DIABETES MELLITUS, TYPE I, DRUG ABUSE, Glaucoma, HEPATITIS C, Hypertension (02/18/2011), Proliferative diabetic  retinopathy(362.02), and Vitiligo.  Surgical History    Past Surgical History:  Procedure Laterality Date  . ABDOMINAL AORTOGRAM W/LOWER EXTREMITY N/A 07/25/2016   Procedure: Abdominal Aortogram w/Bilateral Lower Extremity Runoff;  Surgeon: Conrad Leavenworth, MD;  Location: [facility];  Service: Cardiovascular;  Laterality: N/A;  . ABDOMINAL AORTOGRAM W/LOWER EXTREMITY N/A 12/24/2016   Procedure: ABDOMINAL AORTOGRAM W/LOWER EXTREMITY;  Surgeon: Serafina Mitchell, MD;  Location: [facility];  Service: Cardiovascular;  Laterality: N/A;  . AMPUTATION TOE Right 07/26/2016   Procedure: AMPUTATION GREAT TOE;  Surgeon: Serafina Mitchell, MD;  Location: [facility];  Service: Vascular;  Laterality: Right;  . EYE SURGERY     retinal surgery x 2, right eye  . INSERTION OF ILIAC STENT Right 07/26/2016   Procedure: INSERTION OF RIGHT POPLITEAL STENT WITH BALLOON ANGIOPLASTY;  Surgeon: Serafina Mitchell, MD;  Location: [facility];  Service: Vascular;  Laterality: Right;  . LOWER EXTREMITY ANGIOGRAM Right 07/26/2016   Procedure: LOWER EXTREMITY ANGIOGRAM;  Surgeon: Serafina Mitchell, MD;  Location: [facility];  Service: Vascular;  Laterality: Right;  . PERIPHERAL VASCULAR BALLOON ANGIOPLASTY Right 07/26/2016   Procedure: PERIPHERAL VASCULAR BALLOON ANGIOPLASTY  RIGHT ANTERIOR TIBEAL ARTERY AND RIGHT SUPERFICIAL FEMORAL ARTERY;  Surgeon: Serafina Mitchell, MD;  Location: [facility];  Service: Vascular;  Laterality: Right;  . PERIPHERAL VASCULAR INTERVENTION Right 12/24/2016   Procedure: PERIPHERAL VASCULAR INTERVENTION;  Surgeon: Serafina Mitchell, MD;  Location: [facility];  Service: Cardiovascular;  Laterality: Right;  lower extr     Social History   reports that he has been smoking cigarettes. He has a 51.00 pack-year smoking history. He quit smokeless tobacco use about 35 years ago.  His smokeless tobacco use included chew. He reports current drug use. Drugs: Marijuana, Heroin, Cocaine, "Crack" cocaine, Fentanyl, and  Methylphenidate. He reports that he does not drink alcohol.   Family History   His family history includes Arthritis in his mother; Diabetes in his father; Heart disease in his mother; Kidney disease in his father.   Allergies Allergies  Allergen Reactions  . Codeine Itching    Tolerates hydrocodone/apap     Home Medications  Prior to Admission medications   Medication Sig Start Date End Date Taking? Authorizing Provider  amLODipine (NORVASC) 10 MG tablet Take 1 tablet (10 mg total) by mouth daily for 30 days. 08/15/18 09/14/18 Yes Mikhail, Maryann, DO  gabapentin (NEURONTIN) 400 MG capsule Take 1 capsule (400 mg total) by mouth 3 (three) times daily for 30 days. 08/15/18 09/14/18 Yes Mikhail, Velta Addison, DO  insulin aspart protamine - aspart (NOVOLOG 70/30 MIX) (70-30) 100 UNIT/ML FlexPen Inject 15 units every morning and 10 units every evening. 08/15/18  Yes Mikhail, Velta Addison, DO  pantoprazole (PROTONIX) 40 MG tablet Take 1 tablet (40 mg total) by mouth daily at 6 (six) AM. 08/15/18  Yes Cristal Ford, DO  Accu-Chek Softclix Lancets lancets Use to monitor glucose levels BID; E10.29 08/19/18   Renato Shin, MD  Blood Glucose Calibration (ACCU-CHEK AVIVA) SOLN 1 each by In Vitro route as needed. Use to calibrate meter prn 08/19/18   Renato Shin, MD  blood glucose meter kit and supplies KIT Dispense based on patient and insurance preference. Use up to four times daily as directed. (FOR ICD-9 250.00, 250.01). 08/15/18   Cristal Ford, DO  Blood Glucose Monitoring Suppl (ACCU-CHEK AVIVA PLUS) w/Device KIT 1 each by Does not apply route 2 (two) times a day. Use to monitor glucose levels BID; E10.29 08/19/18  Renato Shin, MD  feeding supplement, ENSURE ENLIVE, (ENSURE ENLIVE) LIQD Take 237 mLs by mouth 2 (two) times daily between meals for 30 days. 08/15/18 09/14/18  Cristal Ford, DO  glucose blood (ACCU-CHEK AVIVA PLUS) test strip Use to monitor glucose levels BID; E10.29 08/19/18   Renato Shin, MD  Insulin  Pen Needle 31G X 5 MM MISC Use with novolog flex pen 08/15/18   Cristal Ford, DO  Isopropyl Alcohol Wipes 70 % MISC Apply 1 each topically daily as needed. 02/27/18   Medina-Vargas, Monina C, NP  nicotine (NICODERM CQ - DOSED IN MG/24 HOURS) 21 mg/24hr patch Place 1 patch (21 mg total) onto the skin daily. 08/16/18   Cristal Ford, DO     Critical care APP time: 47 minutes    Magdalen Spatz, AGACNP-BC [place] Pager # [phone] After 4 pm [phone] 08/23/2018 4:56 PM

## 2018-08-23 NOTE — Progress Notes (Signed)
Rt called to trouble shoot aline due to dampened wave and inability to pull sample.  RT found line unable to be repaired so RT replaced aline toi opposite side.  At this time, [name] is drawing and has a good wave. RT will continue to monitor.

## 2018-08-23 NOTE — ED Notes (Addendum)
Patient had a bloody emesis episode right after CT was performed. Patient oxygen saturation decreased to 83%. Placed patient on NRB mask.  Provider notified.

## 2018-08-24 ENCOUNTER — Inpatient Hospital Stay (HOSPITAL_COMMUNITY): Payer: Medicare HMO

## 2018-08-24 DIAGNOSIS — K922 Gastrointestinal hemorrhage, unspecified: Secondary | ICD-10-CM

## 2018-08-24 DIAGNOSIS — J9601 Acute respiratory failure with hypoxia: Secondary | ICD-10-CM

## 2018-08-24 LAB — POCT I-STAT 7, (LYTES, BLD GAS, ICA,H+H)
Acid-base deficit: 5 mmol/L — ABNORMAL HIGH (ref 0.0–2.0)
Bicarbonate: 19.4 mmol/L — ABNORMAL LOW (ref 20.0–28.0)
Calcium, Ion: 0.98 mmol/L — ABNORMAL LOW (ref 1.15–1.40)
HCT: 27 % — ABNORMAL LOW (ref 39.0–52.0)
Hemoglobin: 9.2 g/dL — ABNORMAL LOW (ref 13.0–17.0)
O2 Saturation: 96 %
Patient temperature: 37.8
Potassium: 3.3 mmol/L — ABNORMAL LOW (ref 3.5–5.1)
Sodium: 151 mmol/L — ABNORMAL HIGH (ref 135–145)
TCO2: 20 mmol/L — ABNORMAL LOW (ref 22–32)
pCO2 arterial: 33.1 mmHg (ref 32.0–48.0)
pH, Arterial: 7.381 (ref 7.350–7.450)
pO2, Arterial: 88 mmHg (ref 83.0–108.0)

## 2018-08-24 LAB — PROCALCITONIN: Procalcitonin: 6 ng/mL

## 2018-08-24 LAB — BASIC METABOLIC PANEL
Anion gap: 10 (ref 5–15)
Anion gap: 11 (ref 5–15)
Anion gap: 13 (ref 5–15)
Anion gap: 13 (ref 5–15)
Anion gap: 16 — ABNORMAL HIGH (ref 5–15)
Anion gap: 16 — ABNORMAL HIGH (ref 5–15)
BUN: 66 mg/dL — ABNORMAL HIGH (ref 8–23)
BUN: 72 mg/dL — ABNORMAL HIGH (ref 8–23)
BUN: 81 mg/dL — ABNORMAL HIGH (ref 8–23)
BUN: 85 mg/dL — ABNORMAL HIGH (ref 8–23)
BUN: 92 mg/dL — ABNORMAL HIGH (ref 8–23)
BUN: 98 mg/dL — ABNORMAL HIGH (ref 8–23)
CO2: 19 mmol/L — ABNORMAL LOW (ref 22–32)
CO2: 19 mmol/L — ABNORMAL LOW (ref 22–32)
CO2: 20 mmol/L — ABNORMAL LOW (ref 22–32)
CO2: 20 mmol/L — ABNORMAL LOW (ref 22–32)
CO2: 21 mmol/L — ABNORMAL LOW (ref 22–32)
CO2: 22 mmol/L (ref 22–32)
Calcium: 6.6 mg/dL — ABNORMAL LOW (ref 8.9–10.3)
Calcium: 6.6 mg/dL — ABNORMAL LOW (ref 8.9–10.3)
Calcium: 6.7 mg/dL — ABNORMAL LOW (ref 8.9–10.3)
Calcium: 6.8 mg/dL — ABNORMAL LOW (ref 8.9–10.3)
Calcium: 6.9 mg/dL — ABNORMAL LOW (ref 8.9–10.3)
Calcium: 7.2 mg/dL — ABNORMAL LOW (ref 8.9–10.3)
Chloride: 113 mmol/L — ABNORMAL HIGH (ref 98–111)
Chloride: 113 mmol/L — ABNORMAL HIGH (ref 98–111)
Chloride: 118 mmol/L — ABNORMAL HIGH (ref 98–111)
Chloride: 118 mmol/L — ABNORMAL HIGH (ref 98–111)
Chloride: 118 mmol/L — ABNORMAL HIGH (ref 98–111)
Chloride: 119 mmol/L — ABNORMAL HIGH (ref 98–111)
Creatinine, Ser: 2.91 mg/dL — ABNORMAL HIGH (ref 0.61–1.24)
Creatinine, Ser: 3.13 mg/dL — ABNORMAL HIGH (ref 0.61–1.24)
Creatinine, Ser: 3.58 mg/dL — ABNORMAL HIGH (ref 0.61–1.24)
Creatinine, Ser: 3.66 mg/dL — ABNORMAL HIGH (ref 0.61–1.24)
Creatinine, Ser: 3.85 mg/dL — ABNORMAL HIGH (ref 0.61–1.24)
Creatinine, Ser: 4.14 mg/dL — ABNORMAL HIGH (ref 0.61–1.24)
GFR calc Af Amer: 17 mL/min — ABNORMAL LOW (ref >60)
GFR calc Af Amer: 18 mL/min — ABNORMAL LOW (ref >60)
GFR calc Af Amer: 20 mL/min — ABNORMAL LOW (ref >60)
GFR calc Af Amer: 20 mL/min — ABNORMAL LOW (ref >60)
GFR calc Af Amer: 24 mL/min — ABNORMAL LOW (ref >60)
GFR calc Af Amer: 26 mL/min — ABNORMAL LOW (ref >60)
GFR calc non Af Amer: 15 mL/min — ABNORMAL LOW (ref >60)
GFR calc non Af Amer: 16 mL/min — ABNORMAL LOW (ref >60)
GFR calc non Af Amer: 17 mL/min — ABNORMAL LOW (ref >60)
GFR calc non Af Amer: 17 mL/min — ABNORMAL LOW (ref >60)
GFR calc non Af Amer: 20 mL/min — ABNORMAL LOW (ref >60)
GFR calc non Af Amer: 22 mL/min — ABNORMAL LOW (ref >60)
Glucose, Bld: 114 mg/dL — ABNORMAL HIGH (ref 70–99)
Glucose, Bld: 123 mg/dL — ABNORMAL HIGH (ref 70–99)
Glucose, Bld: 136 mg/dL — ABNORMAL HIGH (ref 70–99)
Glucose, Bld: 143 mg/dL — ABNORMAL HIGH (ref 70–99)
Glucose, Bld: 178 mg/dL — ABNORMAL HIGH (ref 70–99)
Glucose, Bld: 253 mg/dL — ABNORMAL HIGH (ref 70–99)
Potassium: 3.4 mmol/L — ABNORMAL LOW (ref 3.5–5.1)
Potassium: 3.4 mmol/L — ABNORMAL LOW (ref 3.5–5.1)
Potassium: 3.5 mmol/L (ref 3.5–5.1)
Potassium: 3.5 mmol/L (ref 3.5–5.1)
Potassium: 3.6 mmol/L (ref 3.5–5.1)
Potassium: 4 mmol/L (ref 3.5–5.1)
Sodium: 148 mmol/L — ABNORMAL HIGH (ref 135–145)
Sodium: 149 mmol/L — ABNORMAL HIGH (ref 135–145)
Sodium: 150 mmol/L — ABNORMAL HIGH (ref 135–145)
Sodium: 150 mmol/L — ABNORMAL HIGH (ref 135–145)
Sodium: 151 mmol/L — ABNORMAL HIGH (ref 135–145)
Sodium: 151 mmol/L — ABNORMAL HIGH (ref 135–145)

## 2018-08-24 LAB — CBC
HCT: 28.8 % — ABNORMAL LOW (ref 39.0–52.0)
HCT: 27.2 % — ABNORMAL LOW (ref 39.0–52.0)
Hemoglobin: 9.9 g/dL — ABNORMAL LOW (ref 13.0–17.0)
Hemoglobin: 9.5 g/dL — ABNORMAL LOW (ref 13.0–17.0)
MCH: 29 pg (ref 26.0–34.0)
MCH: 29.7 pg (ref 26.0–34.0)
MCHC: 34.4 g/dL (ref 30.0–36.0)
MCHC: 34.9 g/dL (ref 30.0–36.0)
MCV: 84.5 fL (ref 80.0–100.0)
MCV: 85 fL (ref 80.0–100.0)
Platelets: 242 10*3/uL (ref 150–400)
Platelets: 184 10*3/uL (ref 150–400)
RBC: 3.41 MIL/uL — ABNORMAL LOW (ref 4.22–5.81)
RBC: 3.2 MIL/uL — ABNORMAL LOW (ref 4.22–5.81)
RDW: 13.9 % (ref 11.5–15.5)
RDW: 14.4 % (ref 11.5–15.5)
WBC: 16.2 10*3/uL — ABNORMAL HIGH (ref 4.0–10.5)
WBC: 17.3 10*3/uL — ABNORMAL HIGH (ref 4.0–10.5)
nRBC: 0.1 % (ref 0.0–0.2)
nRBC: 0 % (ref 0.0–0.2)

## 2018-08-24 LAB — GLUCOSE, CAPILLARY
Glucose-Capillary: 127 mg/dL — ABNORMAL HIGH (ref 70–99)
Glucose-Capillary: 128 mg/dL — ABNORMAL HIGH (ref 70–99)
Glucose-Capillary: 129 mg/dL — ABNORMAL HIGH (ref 70–99)
Glucose-Capillary: 129 mg/dL — ABNORMAL HIGH (ref 70–99)
Glucose-Capillary: 130 mg/dL — ABNORMAL HIGH (ref 70–99)
Glucose-Capillary: 133 mg/dL — ABNORMAL HIGH (ref 70–99)
Glucose-Capillary: 138 mg/dL — ABNORMAL HIGH (ref 70–99)
Glucose-Capillary: 147 mg/dL — ABNORMAL HIGH (ref 70–99)
Glucose-Capillary: 154 mg/dL — ABNORMAL HIGH (ref 70–99)
Glucose-Capillary: 173 mg/dL — ABNORMAL HIGH (ref 70–99)
Glucose-Capillary: 176 mg/dL — ABNORMAL HIGH (ref 70–99)
Glucose-Capillary: 177 mg/dL — ABNORMAL HIGH (ref 70–99)
Glucose-Capillary: 180 mg/dL — ABNORMAL HIGH (ref 70–99)
Glucose-Capillary: 186 mg/dL — ABNORMAL HIGH (ref 70–99)
Glucose-Capillary: 189 mg/dL — ABNORMAL HIGH (ref 70–99)
Glucose-Capillary: 190 mg/dL — ABNORMAL HIGH (ref 70–99)
Glucose-Capillary: 196 mg/dL — ABNORMAL HIGH (ref 70–99)
Glucose-Capillary: 219 mg/dL — ABNORMAL HIGH (ref 70–99)
Glucose-Capillary: 96 mg/dL (ref 70–99)
Glucose-Capillary: >600 mg/dL (ref 70–99)

## 2018-08-24 LAB — PHOSPHORUS: Phosphorus: 3.4 mg/dL (ref 2.5–4.6)

## 2018-08-24 LAB — OCCULT BLOOD X 1 CARD TO LAB, STOOL: Fecal Occult Bld: POSITIVE — AB

## 2018-08-24 LAB — TROPONIN I
Troponin I: 0.61 ng/mL (ref <0.03)
Troponin I: 0.94 ng/mL (ref <0.03)

## 2018-08-24 LAB — MAGNESIUM: Magnesium: 2.2 mg/dL (ref 1.7–2.4)

## 2018-08-24 LAB — LACTIC ACID, PLASMA: Lactic Acid, Venous: 2.8 mmol/L (ref 0.5–1.9)

## 2018-08-24 MED ORDER — INSULIN ASPART 100 UNIT/ML ~~LOC~~ SOLN
2.0000 [IU] | SUBCUTANEOUS | Status: DC
  Administered 2018-08-24: 4 [IU] via SUBCUTANEOUS

## 2018-08-24 MED ORDER — DEXTROSE-NACL 5-0.2 % IV SOLN
INTRAVENOUS | Status: DC
  Administered 2018-08-24: 15:00:00 via INTRAVENOUS

## 2018-08-24 MED ORDER — SODIUM CHLORIDE 0.9 % IV BOLUS
1000.0000 mL | Freq: Once | INTRAVENOUS | Status: AC
  Administered 2018-08-24: 1000 mL via INTRAVENOUS

## 2018-08-24 MED ORDER — INSULIN DETEMIR 100 UNIT/ML ~~LOC~~ SOLN
5.0000 [IU] | Freq: Two times a day (BID) | SUBCUTANEOUS | Status: DC
  Administered 2018-08-24 – 2018-08-25 (×2): 5 [IU] via SUBCUTANEOUS
  Filled 2018-08-24 (×3): qty 0.05

## 2018-08-24 MED ORDER — POTASSIUM CHLORIDE 10 MEQ/50ML IV SOLN
10.0000 meq | INTRAVENOUS | Status: AC
  Administered 2018-08-24 (×2): 10 meq via INTRAVENOUS
  Filled 2018-08-24 (×2): qty 50

## 2018-08-24 MED ORDER — SODIUM CHLORIDE 0.9 % IV SOLN
8.0000 mg/h | INTRAVENOUS | Status: DC
  Administered 2018-08-24 – 2018-08-25 (×3): 8 mg/h via INTRAVENOUS
  Filled 2018-08-24 (×5): qty 80

## 2018-08-24 MED ORDER — PANTOPRAZOLE SODIUM 40 MG IV SOLR: 40.0000 mg | Freq: Two times a day (BID) | INTRAVENOUS | Status: DC

## 2018-08-24 NOTE — Progress Notes (Signed)
Inpatient Diabetes Program Recommendations  AACE/ADA: New Consensus Statement on Inpatient Glycemic Control (2015)  Target Ranges:  Prepandial:   less than 140 mg/dL      Peak postprandial:   less than 180 mg/dL (1-2 hours)      Critically ill patients:  140 - 180 mg/dL   Lab Results  Component Value Date   GLUCAP 196 (H) 08/24/2018   HGBA1C 9.8 (H) 08/13/2018    Review of Glycemic Control  Diabetes history: DM1 Outpatient Diabetes medications: Novolog 70/30 insulin mix 15 units am + 10 units pm (discharged on 08/15/18) Current orders for Inpatient glycemic control: IV insulin  Inpatient Diabetes Program Recommendations:   Noted patient admitted in severe DKA. DM coordinator spoke with patient @ last admission and reviewed plan of care and insulin regimen with patient. Patient had office visit with his endocrinologist Dr. Loanne Drilling 08/18/18 and Dr. Loanne Drilling considered changing back to insulin daily. On Last admission, patient complained of frequent hypoglycemia @ home in the pm.  When ready to transition to subcutaneous insulin, please give basal 2 hrs prior to IV drip discontinued and cover CBG with Novolog correction when IV drip discontinued.  Thank you, Nani Gasser. Jarely Juncaj, RN, MSN, CDE  Diabetes Coordinator Inpatient Glycemic Control Team Team Pager [phone] (8am-5pm) 08/24/2018 11:06 AM

## 2018-08-24 NOTE — Progress Notes (Signed)
eLink Physician-Brief Progress Note Patient Name: [name] DOB: February 28, 1956 MRN: [id number]   Date of Service  08/24/2018  HPI/Events of Note  Troponin now up to 0.94 - As mentioned earlier not a candidate for Heparin or ASA d/t coffee grounds from NGT and not a candidate for B-Blocker due to vasopressor requirement. Will need Cardiology Consult in AM. Clinical picture c/w NSTEMI due to earlier lack of EKG changes. CVP = 3.   eICU Interventions  Will order: 1. 12 Lead EKG now.  2. Bolus with 0.9 NaCl 1 liter IV over 1 hour now.  3. Trend Hgb as he will hemodilute with fluid resuscitation.      Intervention Category Intermediate Interventions: Diagnostic test evaluation  Sommer,Steven Cornelia Copa 08/24/2018, 5:13 AM

## 2018-08-24 NOTE — Progress Notes (Signed)
550cc coffee gound output from gastric tube within 6 hours. ELink notified.

## 2018-08-24 NOTE — Progress Notes (Signed)
[facility] Progress Note Patient Name: [name] DOB: July 04, 1955 MRN: [id number]   Date of Service  08/24/2018  HPI/Events of Note  Troponin = 0.06 --> 0.3 --> 0.61. Demand ischemia? Can't use ASA or Heparin IV infusion d/t coffee grounds from gastric tube. Can' t use B-Blocker as the patient is on a Norepinephrine IV infusion. CVP = 8  eICU Interventions  Will order: 1. Bolus with 0.9 NaCl 1 liter IV over 1 hour now.      Intervention Category Major Interventions: Hypotension - evaluation and management  Chevonne Bostrom [place] 08/24/2018, 1:08 AM

## 2018-08-24 NOTE — Progress Notes (Signed)
NAME:  [name], MRN:  [id number], DOB:  1956/04/09, LOS: 1 ADMISSION DATE:  08/23/2018, CONSULTATION DATE:  08/23/2018 REFERRING MD:  ED Physician, CHIEF COMPLAINT:  AMS/ Respiratory Failure   Brief History   All History obtained from medical records as patient is intubated and unresponsive upon evaluation. [name] a 63 y.o.malecurrent every day with medical history significant oftype I diabetes with poor control, hypertension, chronic hepatitis C, vitiligo, polysubstance abuse, depression, medication noncompliance, hyperlipidemia who presented to the ER brought in by EMS secondary to blood sugar of 2500. He lives at home with his son and was found on the floor this morning with a mattress on top of him. Per his son he has been " talking gibberish" for 2 days. EMS found patient with Sats of 70% on RA, and BP in 70's . EMS placed a R shoulder IO. Pt. Was hypothermic upon arrival to the ED and required intubation by the ED MD. PCCM have been consulted to admit patient and manage care.  History of present illness   [name] a 63 y.o.malecurrent every day with medical history significant oftype I diabetes with poor control, hypertension, chronic hepatitis C, vitiligo, polysubstance abuse, depression, medication noncompliance, hyperlipidemia who presented to the ER brought in by EMS secondary to blood sugar of 2500, severe diabetic ketoacidosis and hypothermia with rectal temp of 90.3 F . He lives at home with his son and was found on the floor this morning with a mattress on top of him. Unknown down time. Per his son he has been " talking gibberish" for 2 days. EMS found patient with Sats of 70% on RA, and BP in 70's . EMS placed a R shoulder IO. IVF were initiated. He improved initially with IV fluids.Pt. Was hypothermic ( 90.3) upon arrival to the ED and Hypoxemic requiring  intubation by the ED MD. PCCM have been consulted to admit patient and manage care.  In the ED patient was  found in DKA, Serum Glucose of > 2500 with anion gap of 35, Venous ABG revealed Ph of 7.15, CO2 of 26, PO2 of 59, bicarb of 9. WBC was 21,000, CXR showed no active disease.  Lactate was 5, Creatinine was 5.05, CT Head and cervical spine were both negative for acute process, however patient vomited coffee ground emesis after CT was performed . He was started on octreotide and Protonix.He desaturated and was placed on NRB prior to intubation.Marland KitchenHe was intubated to protect his airway. ABG is pending. Right IJ CVC was placed by ED MD. He became hypotensive, he was bolused with 2 L IVF  and was started on Levophed. Ammonia Level was 155, Initial Potassium was 6.4,  Corrected Na  170-190 He was started on an insulin gtt, IVF, and Propofol. Tox screens were + for Cocaine and Mariajuana. PCCM was consulted to admit and manage patient care.  Significant Hospital Events   Admission 5/10 Recent Admission 4/28-5/2>> Persistent hypoglycemia/uncontrolled diabetes mellitus Consults:    Procedures:  5/10 IO R shoulder >> 5/10 5/10 A line  R IJ CVC 5/10 >>   Significant Diagnostic Tests:  5/10 CT Cervical Spine/ Head IMPRESSION: Mild diffuse cortical atrophy. Mild chronic ischemic white matter disease.  No acute intracranial abnormality seen. Severe degenerative disc disease is noted at C6-7.  No acute abnormality seen in the cervical spine.  01/2018 Echo Normal LV size, Kersting thickness, EF 71-24%, grade 1 diastolic dysfunction   Micro Data:  5/10>> COVID 19>> Negative 5/10>>Blood Cx  x 2>> 5/10 Tracheal Aspirate>>   Antimicrobials:  Unasyn 5/10 >>   Interim history/subjective:  Levophed weaned off Has experienced coffee-ground emesis, now with melena.  Hemoglobin remained stable Currently on insulin 0.5 units/h, D5 0.45 NS 100 cc/h, octreotide Low-dose propofol running Tolerating PS currently  Objective   Blood pressure (!) 102/49, pulse 83, temperature 98.4 F (36.9 C), resp. rate (!) 24,  weight 67.9 kg, SpO2 100 %. CVP:  [4 mmHg-10 mmHg] 6 mmHg  Vent Mode: PSV;CPAP FiO2 (%):  [40 %-85 %] 40 % Set Rate:  [14 bmp] 14 bmp Vt Set:  [620 mL] 620 mL PEEP:  [5 cmH20] 5 cmH20 Pressure Support:  [5 cmH20-10 cmH20] 5 cmH20 Plateau Pressure:  [15 cmH20-20 cmH20] 17 cmH20   Intake/Output Summary (Last 24 hours) at 08/24/2018 1048 Last data filed at 08/24/2018 0900 Gross per 24 hour  Intake 11331.33 ml  Output 925 ml  Net 10406.33 ml   Filed Weights   08/24/18 0341  Weight: 67.9 kg    Examination: General: Sedated and intubated unkept , frail appearing elderly male  HENT: Coffee ground emesis around mouth, NCAT,ETT is secure Lungs: Bilateral Chest excursion, Clear upon auscultation Cardiovascular: S1, S2, RRR No MRG, NSR per Tele Abdomen: Flat, Slightly distended, BS diminished Extremities:No obvious deformity, Warm to touch with brisk refill per nailbeds Neuro: Sedated and intubated, MAE x 4, Follows no commands GU: Foley cath  [facility] Problem list     Assessment & Plan:  Severe Diabetic Keto Acidosis, Initial anion gap > 46 Severe Hyperglycemia initial serum glucose 2500 Dehydration  Plan: Hemodynamically improved.  Increase D5 0.45 NS to 125 cc/h Continue insulin infusion, both AG and CO2 are improving, not yet to goal Serial BMP as per protocol  Acute Respiratory Failure Intubated for Airway Protection ? Aspiration after emesis, chest x-ray reassuring Plan Okay for PS but no plans for extubation until we ensure metabolic recovery, hemodynamic stability given possible GI blood loss Follow chest x-ray.  Low threshold to stop Unasyn in the next 24-48 hours if no evolving pulmonary infiltrate   Acute Renal Failure, improving with volume resuscitation but remains oliguric Hyperkalemia Initial potassium 6.4 Hypernatremia Corrected Na 170-190 Plan IVF Follow urine output, BMP, acidosis.  Frequent labs ordered Recheck CK since the patient was found  down Ensure adequate renal perfusion, avoid nephrotoxins   Shock, likely hypovolemic, consider also septic, source unclear.  Improved Plan Norepinephrine weaned to off Lactic acid cleared Continue volume resuscitation  Stress non-STEMI Plan Follow trop for peak Defer heparin, ASA given apparent GI blood loss. Would reconsider if he develops ECG changes, large elevation troponin Likely needs cards consult once stabilized.    Meets sirs criteria, consider sepsis, unclear source.  Procalcitonin positive COVID Negative Plan On empiric Unasyn currently.  Tailor to culture data, chest x-ray Blood, respiratory, urine cultures pending Follow procalcitonin   Probable upper GI blood loss Hyperammonemia History hep C, no evidence of cirrhosis by CT 02/02/2018 Plan Continue octreotide Restart Protonix infusion Follow CBC GI consultation once metabolic stabilization or if he acutely changes, dropped his hemoglobin Follow ammonia, consider lactulose depending on neurological improvement  AMS/ Encephalopathy Plan CT head negative for acute findings Consider EEG if no improvement  Neuro Checks per unit protocol   Best practice:  Diet: NPO Pain/Anxiety/Delirium protocol (if indicated): Propofol VAP protocol (if indicated): Ordered DVT prophylaxis: PAS hose GI prophylaxis: Protonix gtt Glucose control: DKA protocol Mobility: BR Code Status: Full Family Communication:  Disposition:ICU  Labs   CBC: Recent Labs  Lab 08/23/18 1100  08/23/18 1413 08/23/18 1547 08/23/18 2124 08/24/18 0212 08/24/18 0406  WBC 27.2*  --  21.7*  --   --   --  16.2*  NEUTROABS 22.0*  --  19.2*  --   --   --   --   HGB 11.3*   < > 10.4* 10.5* 9.9* 9.2* 9.9*  HCT 41.4   < > 34.7* 31.0* 29.0* 27.0* 28.8*  MCV 107.0*  --  97.7  --   --   --  84.5  PLT 452*  --  363  --   --   --  242   < > = values in this interval not displayed.    Basic Metabolic Panel: Recent Labs  Lab 08/23/18 1100   08/23/18 1839 08/23/18 2040 08/23/18 2124 08/23/18 2357 08/24/18 0212 08/24/18 0406 08/24/18 0800  NA 134*   < > 144 148* 147* 149* 151* 150* 148*  K 6.4*   < > 3.6 3.6 3.6 3.5 3.3* 3.4* 4.0  CL 76*   < > 103 108  --  113*  --  118* 113*  CO2 12*   < > 13* 18*  --  20*  --  19* 19*  GLUCOSE >2,500*   < > 724* 437*  --  253*  --  136* 123*  BUN 107*   < > 106* 100*  --  98*  --  92* 85*  CREATININE 5.36*   < > 4.84* 4.46*  --  4.14*  --  3.85* 3.66*  CALCIUM 8.6*   < > 7.9* 7.7*  --  7.2*  --  6.8* 6.6*  MG 4.1*  --   --   --   --   --   --  2.2  --   PHOS  --   --   --   --   --   --   --  3.4  --    < > = values in this interval not displayed.   GFR: Estimated Creatinine Clearance: 20.4 mL/min (A) (by C-G formula based on SCr of 3.66 mg/dL (H)). Recent Labs  Lab 08/23/18 1055 08/23/18 1100 08/23/18 1413 08/23/18 1839 08/23/18 2040 08/23/18 2223 08/24/18 0406  PROCALCITON  --   --   --  4.00  --   --  6.00  WBC  --  27.2* 21.7*  --   --   --  16.2*  LATICACIDVEN 5.0*  --   --   --  6.2* 5.3* 2.8*    Liver Function Tests: Recent Labs  Lab 08/23/18 1100 08/23/18 1413  AST 22 26  ALT 26 24  ALKPHOS 146* 133*  BILITOT 1.3* 1.5*  PROT 6.2* 5.3*  ALBUMIN 2.9* 2.5*   Recent Labs  Lab 08/23/18 1100  LIPASE 17   Recent Labs  Lab 08/23/18 1055  AMMONIA 155*    ABG    Component Value Date/Time   PHART 7.381 08/24/2018 0212   PCO2ART 33.1 08/24/2018 0212   PO2ART 88.0 08/24/2018 0212   HCO3 19.4 (L) 08/24/2018 0212   TCO2 20 (L) 08/24/2018 0212   ACIDBASEDEF 5.0 (H) 08/24/2018 0212   O2SAT 96.0 08/24/2018 0212     Coagulation Profile: Recent Labs  Lab 08/23/18 1100  INR 1.0    Cardiac Enzymes: Recent Labs  Lab 08/23/18 1100 08/23/18 1839 08/23/18 2223 08/24/18 0406  CKTOTAL 181  --   --   --  TROPONINI 0.06* 0.31* 0.61* 0.94*    HbA1C: Hgb A1c MFr Bld  Date/Time Value Ref Range Status  08/13/2018 04:53 AM 9.8 (H) 4.8 - 5.6 % Final     Comment:    (NOTE) Pre diabetes:          5.7%-6.4% Diabetes:              >6.4% Glycemic control for   <7.0% adults with diabetes   01/30/2018 09:22 AM 10.1 (H) 4.8 - 5.6 % Final    Comment:    (NOTE) Pre diabetes:          5.7%-6.4% Diabetes:              >6.4% Glycemic control for   <7.0% adults with diabetes     CBG: Recent Labs  Lab 08/24/18 0541 08/24/18 0636 08/24/18 0751 08/24/18 0849 08/24/18 0954  GLUCAP 129* 127* 130* 138* 173*    Independent CC time 34 minutes   Baltazar Apo, MD, PhD 08/24/2018, 11:07 AM [facility] [phone] or if no answer [phone]

## 2018-08-24 NOTE — Progress Notes (Signed)
CRITICAL VALUE ALERT  Critical Value:  Troponin 0.61    Lactic acid 5.3  Date & Time Notied: 08/24/2018   0001  Provider Notified: Warren Lacy  Orders Received/Actions taken:

## 2018-08-24 NOTE — Progress Notes (Signed)
eLink Physician-Brief Progress Note Patient Name: ELIZANDRO LAURA DOB: 05-16-1955 MRN: [id number]   Date of Service  08/24/2018  HPI/Events of Note  Hypotension - MAP = 59 by A-line. CVP = 4.   eICU Interventions  Bolus with 0.9 NaCl 1 liter IV over 1 hour now.      Intervention Category Major Interventions: Hypotension - evaluation and management  Sommer,Steven Eugene 08/24/2018, 3:32 AM

## 2018-08-25 ENCOUNTER — Ambulatory Visit: Payer: Medicare HMO | Admitting: Endocrinology

## 2018-08-25 ENCOUNTER — Encounter (HOSPITAL_COMMUNITY)
Admission: EM | Disposition: A | Discharge status: Home Health | Payer: Self-pay | Source: Home / Self Care | Attending: Internal Medicine

## 2018-08-25 ENCOUNTER — Encounter (HOSPITAL_COMMUNITY): Payer: Self-pay | Admitting: Gastroenterology

## 2018-08-25 DIAGNOSIS — K259 Gastric ulcer, unspecified as acute or chronic, without hemorrhage or perforation: Secondary | ICD-10-CM

## 2018-08-25 DIAGNOSIS — K921 Melena: Secondary | ICD-10-CM

## 2018-08-25 DIAGNOSIS — D649 Anemia, unspecified: Secondary | ICD-10-CM

## 2018-08-25 DIAGNOSIS — K228 Other specified diseases of esophagus: Secondary | ICD-10-CM

## 2018-08-25 DIAGNOSIS — K2210 Ulcer of esophagus without bleeding: Secondary | ICD-10-CM

## 2018-08-25 DIAGNOSIS — K920 Hematemesis: Secondary | ICD-10-CM

## 2018-08-25 HISTORY — PX: BIOPSY: SHX5522

## 2018-08-25 HISTORY — PX: ESOPHAGOGASTRODUODENOSCOPY: SHX5428

## 2018-08-25 LAB — HEPATIC FUNCTION PANEL
ALT: 26 U/L (ref 0–44)
AST: 37 U/L (ref 15–41)
Albumin: 1.7 g/dL — ABNORMAL LOW (ref 3.5–5.0)
Alkaline Phosphatase: 89 U/L (ref 38–126)
Bilirubin, Direct: <0.1 mg/dL (ref 0.0–0.2)
Total Bilirubin: 0.5 mg/dL (ref 0.3–1.2)
Total Protein: 4.6 g/dL — ABNORMAL LOW (ref 6.5–8.1)

## 2018-08-25 LAB — CBC
HCT: 25.8 % — ABNORMAL LOW (ref 39.0–52.0)
HCT: 28.7 % — ABNORMAL LOW (ref 39.0–52.0)
Hemoglobin: 8.7 g/dL — ABNORMAL LOW (ref 13.0–17.0)
Hemoglobin: 9.5 g/dL — ABNORMAL LOW (ref 13.0–17.0)
MCH: 29 pg (ref 26.0–34.0)
MCH: 29.1 pg (ref 26.0–34.0)
MCHC: 33.7 g/dL (ref 30.0–36.0)
MCHC: 33.1 g/dL (ref 30.0–36.0)
MCV: 86 fL (ref 80.0–100.0)
MCV: 87.8 fL (ref 80.0–100.0)
Platelets: 158 10*3/uL (ref 150–400)
Platelets: 118 10*3/uL — ABNORMAL LOW (ref 150–400)
RBC: 3 MIL/uL — ABNORMAL LOW (ref 4.22–5.81)
RBC: 3.27 MIL/uL — ABNORMAL LOW (ref 4.22–5.81)
RDW: 14.6 % (ref 11.5–15.5)
RDW: 15.1 % (ref 11.5–15.5)
WBC: 16.7 10*3/uL — ABNORMAL HIGH (ref 4.0–10.5)
WBC: 18.9 10*3/uL — ABNORMAL HIGH (ref 4.0–10.5)
nRBC: 0 % (ref 0.0–0.2)
nRBC: 0 % (ref 0.0–0.2)

## 2018-08-25 LAB — TROPONIN I: Troponin I: 0.38 ng/mL (ref <0.03)

## 2018-08-25 LAB — BASIC METABOLIC PANEL
Anion gap: 8 (ref 5–15)
Anion gap: 9 (ref 5–15)
BUN: 61 mg/dL — ABNORMAL HIGH (ref 8–23)
BUN: 48 mg/dL — ABNORMAL HIGH (ref 8–23)
CO2: 21 mmol/L — ABNORMAL LOW (ref 22–32)
CO2: 21 mmol/L — ABNORMAL LOW (ref 22–32)
Calcium: 6.9 mg/dL — ABNORMAL LOW (ref 8.9–10.3)
Calcium: 7.3 mg/dL — ABNORMAL LOW (ref 8.9–10.3)
Chloride: 122 mmol/L — ABNORMAL HIGH (ref 98–111)
Chloride: 121 mmol/L — ABNORMAL HIGH (ref 98–111)
Creatinine, Ser: 2.85 mg/dL — ABNORMAL HIGH (ref 0.61–1.24)
Creatinine, Ser: 2.15 mg/dL — ABNORMAL HIGH (ref 0.61–1.24)
GFR calc Af Amer: 26 mL/min — ABNORMAL LOW (ref >60)
GFR calc Af Amer: 37 mL/min — ABNORMAL LOW (ref >60)
GFR calc non Af Amer: 23 mL/min — ABNORMAL LOW (ref >60)
GFR calc non Af Amer: 32 mL/min — ABNORMAL LOW (ref >60)
Glucose, Bld: 185 mg/dL — ABNORMAL HIGH (ref 70–99)
Glucose, Bld: 105 mg/dL — ABNORMAL HIGH (ref 70–99)
Potassium: 3.4 mmol/L — ABNORMAL LOW (ref 3.5–5.1)
Potassium: 3.6 mmol/L (ref 3.5–5.1)
Sodium: 151 mmol/L — ABNORMAL HIGH (ref 135–145)
Sodium: 151 mmol/L — ABNORMAL HIGH (ref 135–145)

## 2018-08-25 LAB — TRIGLYCERIDES: Triglycerides: 145 mg/dL (ref <150)

## 2018-08-25 LAB — GLUCOSE, CAPILLARY
Glucose-Capillary: 128 mg/dL — ABNORMAL HIGH (ref 70–99)
Glucose-Capillary: 136 mg/dL — ABNORMAL HIGH (ref 70–99)
Glucose-Capillary: 40 mg/dL — CL (ref 70–99)
Glucose-Capillary: 51 mg/dL — ABNORMAL LOW (ref 70–99)
Glucose-Capillary: 56 mg/dL — ABNORMAL LOW (ref 70–99)
Glucose-Capillary: 60 mg/dL — ABNORMAL LOW (ref 70–99)
Glucose-Capillary: 71 mg/dL (ref 70–99)
Glucose-Capillary: 75 mg/dL (ref 70–99)
Glucose-Capillary: 93 mg/dL (ref 70–99)

## 2018-08-25 LAB — BLOOD GAS, ARTERIAL
Acid-Base Excess: 0.4 mmol/L (ref 0.0–2.0)
Bicarbonate: 23.5 mmol/L (ref 20.0–28.0)
Drawn by: 55062
FIO2: 40
MECHVT: 620 mL
O2 Saturation: 94.8 %
PEEP: 5 cmH2O
Patient temperature: 99.8
RATE: 14 resp/min
pCO2 arterial: 32.6 mmHg (ref 32.0–48.0)
pH, Arterial: 7.475 — ABNORMAL HIGH (ref 7.350–7.450)
pO2, Arterial: 72.9 mmHg — ABNORMAL LOW (ref 83.0–108.0)

## 2018-08-25 LAB — PROCALCITONIN: Procalcitonin: 2.63 ng/mL

## 2018-08-25 LAB — AMMONIA: Ammonia: 21 umol/L (ref 9–35)

## 2018-08-25 LAB — OCCULT BLOOD GASTRIC / DUODENUM (SPECIMEN CUP): Occult Blood, Gastric: POSITIVE — AB

## 2018-08-25 LAB — MAGNESIUM: Magnesium: 1.9 mg/dL (ref 1.7–2.4)

## 2018-08-25 LAB — PHOSPHORUS: Phosphorus: 2.9 mg/dL (ref 2.5–4.6)

## 2018-08-25 SURGERY — EGD (ESOPHAGOGASTRODUODENOSCOPY)
Anesthesia: Moderate Sedation

## 2018-08-25 MED ORDER — EPINEPHRINE 1 MG/10ML IJ SOSY
PREFILLED_SYRINGE | INTRAMUSCULAR | Status: AC
  Filled 2018-08-25: qty 10

## 2018-08-25 MED ORDER — FENTANYL CITRATE (PF) 100 MCG/2ML IJ SOLN
INTRAMUSCULAR | Status: AC
  Filled 2018-08-25: qty 4

## 2018-08-25 MED ORDER — DEXTROSE 50 % IV SOLN
25.0000 g | INTRAVENOUS | Status: AC
  Administered 2018-08-25: 25 g via INTRAVENOUS
  Filled 2018-08-25: qty 50

## 2018-08-25 MED ORDER — DEXTROSE 50 % IV SOLN
12.5000 g | INTRAVENOUS | Status: AC
  Administered 2018-08-25: 12.5 g via INTRAVENOUS
  Filled 2018-08-25: qty 50

## 2018-08-25 MED ORDER — INSULIN ASPART 100 UNIT/ML ~~LOC~~ SOLN
1.0000 [IU] | SUBCUTANEOUS | Status: DC
  Administered 2018-08-26 (×2): 2 [IU] via SUBCUTANEOUS
  Administered 2018-08-26: 1 [IU] via SUBCUTANEOUS
  Administered 2018-08-26: 2 [IU] via SUBCUTANEOUS
  Administered 2018-08-27: 1 [IU] via SUBCUTANEOUS
  Administered 2018-08-27: 3 [IU] via SUBCUTANEOUS
  Administered 2018-08-27: 2 [IU] via SUBCUTANEOUS
  Administered 2018-08-27: 1 [IU] via SUBCUTANEOUS
  Administered 2018-08-27 (×2): 2 [IU] via SUBCUTANEOUS
  Administered 2018-08-27: 1 [IU] via SUBCUTANEOUS
  Administered 2018-08-28: 4 [IU] via SUBCUTANEOUS
  Administered 2018-08-28: 1 [IU] via SUBCUTANEOUS
  Administered 2018-08-28: 3 [IU] via SUBCUTANEOUS
  Administered 2018-08-28 (×2): 1 [IU] via SUBCUTANEOUS
  Administered 2018-08-29: 4 [IU] via SUBCUTANEOUS
  Administered 2018-08-29 (×2): 3 [IU] via SUBCUTANEOUS
  Administered 2018-08-29: 5 [IU] via SUBCUTANEOUS

## 2018-08-25 MED ORDER — DEXTROSE 50 % IV SOLN
INTRAVENOUS | Status: AC
  Administered 2018-08-25: 50 mL
  Filled 2018-08-25: qty 50

## 2018-08-25 MED ORDER — INSULIN DETEMIR 100 UNIT/ML ~~LOC~~ SOLN: 5.0000 [IU] | Freq: Every day | SUBCUTANEOUS | Status: DC

## 2018-08-25 MED ORDER — MIDAZOLAM HCL (PF) 5 MG/ML IJ SOLN
INTRAMUSCULAR | Status: AC
  Filled 2018-08-25: qty 3

## 2018-08-25 MED ORDER — DEXTROSE 50 % IV SOLN
12.5000 g | Freq: Once | INTRAVENOUS | Status: AC
  Administered 2018-08-25: 12.5 g via INTRAVENOUS

## 2018-08-25 MED ORDER — DEXTROSE 5 % IV SOLN
INTRAVENOUS | Status: DC
  Administered 2018-08-25 – 2018-08-26 (×3): via INTRAVENOUS

## 2018-08-25 MED ORDER — PANTOPRAZOLE SODIUM 40 MG IV SOLR
40.0000 mg | Freq: Two times a day (BID) | INTRAVENOUS | Status: DC
  Administered 2018-08-25 – 2018-08-31 (×12): 40 mg via INTRAVENOUS
  Filled 2018-08-25 (×12): qty 40

## 2018-08-25 NOTE — Progress Notes (Signed)
Inpatient Diabetes Program Recommendations  AACE/ADA: New Consensus Statement on Inpatient Glycemic Control (2015)  Target Ranges:  Prepandial:   less than 140 mg/dL      Peak postprandial:   less than 180 mg/dL (1-2 hours)      Critically ill patients:  140 - 180 mg/dL   Lab Results  Component Value Date   GLUCAP 51 (L) 08/25/2018   HGBA1C 9.8 (H) 08/13/2018    Review of Glycemic Control Results for [name], [name] (MRN [id number]) as of 08/25/2018 11:54  Ref. Range 08/25/2018 03:20 08/25/2018 03:21 08/25/2018 03:46 08/25/2018 07:07 08/25/2018 11:15  Glucose-Capillary Latest Ref Range: 70 - 99 mg/dL 36 (LL) 40 (LL) 136 (H) 71 51 (L)   Diabetes history: DM1 Outpatient Diabetes medications: 70/30 15 units am + 10 units pm Current orders for Inpatient glycemic control: ICU order set  Inpatient Diabetes Program Recommendations:   Since patient is type 1 DM,-D/C ICU order set -Decrease Levemir to once daily -Change Novolog correction to 1-3 units  Thank you, Bethena Roys E. Karin Pinedo, RN, MSN, CDE  Diabetes Coordinator Inpatient Glycemic Control Team Team Pager [phone] (8am-5pm) 08/25/2018 11:56 AM

## 2018-08-25 NOTE — Progress Notes (Signed)
Hypoglycemic Event  CBG 40  Treatment: 35ml D50 Symptoms: None  Follow-up CBG: Time:347 CBG Result: 136  Possible Reasons for Event: Inadequate meal intake  Comments/MD notified:    Beaulah Corin

## 2018-08-25 NOTE — Progress Notes (Signed)
eLink Physician-Brief Progress Note Patient Name: [name] DOB: 24-Jun-1955 MRN: [id number]   Date of Service  08/25/2018  HPI/Events of Note  Hypoglycemia - Blood glucose = 40.   eICU Interventions  Will order: 1. Increase D5 0.2 NaCl IV infusion rate to 50 mL/hour.      Intervention Category Major Interventions: Other:  Sommer,Steven Cornelia Copa 08/25/2018, 4:18 AM

## 2018-08-25 NOTE — Consult Note (Signed)
[facility] Consult: 8:38 AM 08/25/2018  LOS: 2 days    Referring Provider: Joelyn Oms MD  Primary Care Physician:  Renato Shin MD Primary Gastroenterologist:  unassigned    Reason for Consultation:  CGE, dark stools.     HPI: [name] is a 63 y.o. male.  Hx Hepatitis C.  S/p Harvoni, undetectable virus on fup test.    Substance, ETOH abuse. DM 1.  DKA. PVD.  Medical non-compliance.  Non STEMI 01/2018.  OSA.  Liver hemangiomas dating to 2004. Issues with homelessness, current domestic status not known. CKD.      Readmission after found down with DKA, glucose > 2500, AKI, shock, dehydration, acidosis, Covid negative.  Tox + for cocaine, THC.     Intubated for airway protection.  Material looking like dried blood in/at mouth, CGE (Gastroccult +) noted at arrival to ED.  Stools dark and FOBT +.   hgb 11.3 >> 8.7 since arrival.  CGE per NGT resolved but dark stools persist.   Empiric Octreotide and PPI drip.  However no known or imaging confirmed liver disease.  Initial labs with alk phos 133, T bili 1.5, Ammonia 155 but o/w normal LFTs and lipase.   Ultrasound 09/2016 with liver hemangioma, O/w normal liver.  Metavir F0/F1 (fibrosis risk minimal)  Pt intubated so unable to obtain ROS or speak to pt.        Past Medical History:  Diagnosis Date   DEPRESSION    DIABETES MELLITUS, TYPE I    DRUG ABUSE    pt should have NO controlled substances rx'ed   Glaucoma    HEPATITIS C    chronic   Hypertension 02/18/2011   Proliferative diabetic retinopathy(362.02)    Vitiligo     Past Surgical History:  Procedure Laterality Date   ABDOMINAL AORTOGRAM W/LOWER EXTREMITY N/A 07/25/2016   Procedure: Abdominal Aortogram w/Bilateral Lower Extremity Runoff;  Surgeon: Conrad Stillwater, MD;  Location: [place]  [facility];  Service: Cardiovascular;  Laterality: N/A;   ABDOMINAL AORTOGRAM W/LOWER EXTREMITY N/A 12/24/2016   Procedure: ABDOMINAL AORTOGRAM W/LOWER EXTREMITY;  Surgeon: Serafina Mitchell, MD;  Location: [place] [facility];  Service: Cardiovascular;  Laterality: N/A;   AMPUTATION TOE Right 07/26/2016   Procedure: AMPUTATION GREAT TOE;  Surgeon: Serafina Mitchell, MD;  Location: [facility];  Service: Vascular;  Laterality: Right;   EYE SURGERY     retinal surgery x 2, right eye   INSERTION OF ILIAC STENT Right 07/26/2016   Procedure: INSERTION OF RIGHT POPLITEAL STENT WITH BALLOON ANGIOPLASTY;  Surgeon: Serafina Mitchell, MD;  Location: [place];  Service: Vascular;  Laterality: Right;   LOWER EXTREMITY ANGIOGRAM Right 07/26/2016   Procedure: LOWER EXTREMITY ANGIOGRAM;  Surgeon: Serafina Mitchell, MD;  Location: [place];  Service: Vascular;  Laterality: Right;   PERIPHERAL VASCULAR BALLOON ANGIOPLASTY Right 07/26/2016   Procedure: PERIPHERAL VASCULAR BALLOON ANGIOPLASTY  RIGHT ANTERIOR TIBEAL ARTERY AND RIGHT SUPERFICIAL FEMORAL ARTERY;  Surgeon: Serafina Mitchell, MD;  Location: [place];  Service: Vascular;  Laterality: Right;  PERIPHERAL VASCULAR INTERVENTION Right 12/24/2016   Procedure: PERIPHERAL VASCULAR INTERVENTION;  Surgeon: Serafina Mitchell, MD;  Location: [place] [facility];  Service: Cardiovascular;  Laterality: Right;  lower extr    Prior to Admission medications   Medication Sig Start Date End Date Taking? Authorizing Provider  amLODipine (NORVASC) 10 MG tablet Take 1 tablet (10 mg total) by mouth daily for 30 days. 08/15/18 09/14/18 Yes Mikhail, Maryann, DO  gabapentin (NEURONTIN) 400 MG capsule Take 1 capsule (400 mg total) by mouth 3 (three) times daily for 30 days. 08/15/18 09/14/18 Yes Mikhail, Velta Addison, DO  insulin aspart protamine - aspart (NOVOLOG 70/30 MIX) (70-30) 100 UNIT/ML FlexPen Inject 15 units every morning and 10 units every evening. 08/15/18  Yes Mikhail, Velta Addison, DO  pantoprazole (PROTONIX) 40  MG tablet Take 1 tablet (40 mg total) by mouth daily at 6 (six) AM. 08/15/18  Yes Cristal Ford, DO  Accu-Chek Softclix Lancets lancets Use to monitor glucose levels BID; E10.29 08/19/18   Renato Shin, MD  Blood Glucose Calibration (ACCU-CHEK AVIVA) SOLN 1 each by In Vitro route as needed. Use to calibrate meter prn 08/19/18   Renato Shin, MD  blood glucose meter kit and supplies KIT Dispense based on patient and insurance preference. Use up to four times daily as directed. (FOR ICD-9 250.00, 250.01). 08/15/18   Cristal Ford, DO  Blood Glucose Monitoring Suppl (ACCU-CHEK AVIVA PLUS) w/Device KIT 1 each by Does not apply route 2 (two) times a day. Use to monitor glucose levels BID; E10.29 08/19/18   Renato Shin, MD  feeding supplement, ENSURE ENLIVE, (ENSURE ENLIVE) LIQD Take 237 mLs by mouth 2 (two) times daily between meals for 30 days. 08/15/18 09/14/18  Cristal Ford, DO  glucose blood (ACCU-CHEK AVIVA PLUS) test strip Use to monitor glucose levels BID; E10.29 08/19/18   Renato Shin, MD  Insulin Pen Needle 31G X 5 MM MISC Use with novolog flex pen 08/15/18   Cristal Ford, DO  Isopropyl Alcohol Wipes 70 % MISC Apply 1 each topically daily as needed. 02/27/18   Medina-Vargas, Monina C, NP  nicotine (NICODERM CQ - DOSED IN MG/24 HOURS) 21 mg/24hr patch Place 1 patch (21 mg total) onto the skin daily. 08/16/18   Cristal Ford, DO    Scheduled Meds:  chlorhexidine gluconate (MEDLINE KIT)  15 mL Mouth Rinse BID   insulin aspart  2-6 Units Subcutaneous Q4H   insulin detemir  5 Units Subcutaneous Q12H   mouth rinse  15 mL Mouth Rinse 10 times per day   [START ON 08/27/2018] pantoprazole  40 mg Intravenous Q12H   Infusions:  sodium chloride     ampicillin-sulbactam (UNASYN) IV 3 g (08/25/18 0814)   dextrose 5 % and 0.2 % NaCl 50 mL/hr at 08/25/18 0427   octreotide  (SANDOSTATIN)    IV infusion 50 mcg/hr (08/25/18 0400)   pantoprozole (PROTONIX) infusion 8 mg/hr (08/25/18 0528)    propofol (DIPRIVAN) infusion 35 mcg/kg/min (08/25/18 0826)   PRN Meds: Place/Maintain arterial line **AND** sodium chloride   Allergies as of 08/23/2018 - Review Complete 08/23/2018  Allergen Reaction Noted   Codeine Itching     Family History  Problem Relation Age of Onset   Diabetes Father    Kidney disease Father    Arthritis Mother    Heart disease Mother        CAD    Social History   Socioeconomic History   Marital status: Single    Spouse name: Not on file  Number of children: 2   Years of education: 14   Highest education level: Associate degree: occupational, Hotel manager, or vocational program  Occupational History    Employer: UNEMPLOYED    Comment: disabled  Scientist, product/process development strain: Very hard   Food insecurity:    Worry: Often true    Inability: Often true   Transportation needs:    Medical: Yes    Non-medical: Yes  Tobacco Use   Smoking status: Current Every Day Smoker    Packs/day: 1.00    Years: 51.00    Pack years: 51.00    Types: Cigarettes   Smokeless tobacco: Former Systems developer    Types: Chew    Quit date: 1985  Substance and Sexual Activity   Alcohol use: No    Alcohol/week: 0.0 standard drinks    Comment: 08/14/14 none   Drug use: Yes    Types: Marijuana, Heroin, Cocaine, "Crack" cocaine, Fentanyl, Methylphenidate    Comment: s/p rehab x 6, occasional drug use (per pt last  use was 06/2015)   Sexual activity: Not Currently  Lifestyle   Physical activity:    Days per week: 0 days    Minutes per session: 0 min   Stress: Very much  Relationships   Social connections:    Talks on phone: Never    Gets together: Once a week    Attends religious service: Never    Active member of club or organization: No    Attends meetings of clubs or organizations: Not on file    Relationship status: Never married   Intimate partner violence:    Fear of current or ex partner: No    Emotionally abused: No    Physically  abused: No    Forced sexual activity: No  Other Topics Concern   Not on file  Social History Narrative   Lives in boarding house/homeless   Ongoing occ drug use-crack   Single, disabled, prev mental health drug counselor.     REVIEW OF SYSTEMS: Pt intubated so unable to obtain ROS or his stated history.    PHYSICAL EXAM: Vital signs in last 24 hours: Vitals:   08/25/18 0709 08/25/18 0710  BP: (!) 154/54   Pulse: 90   Resp: (!) 33   Temp:  99.9 F (37.7 C)  SpO2: 99%    Wt Readings from Last 3 Encounters:  08/25/18 71.7 kg  08/18/18 68.9 kg  08/12/18 64.7 kg    General: looks ill.   Intubated on vent, multiple IV drips (no pressors) infusing. Head:  No head trauma  Eyes:  No icterus or conj pallor Ears:  Unable to assess hearing  Nose:  No discharge Mouth:  No blood at mouth, ETT tube in place Neck:  No mass or JVD Lungs:  Clear bil.  Steady, non-labored resps on vent Heart: RRR.  No MRG.  S1, S2 present Abdomen:  Soft, NT, ND.  BS normal but reduced.  No HSM, masses, bruits, hernias.  Green, bilious material (not CG) in NG suction canister Rectal: no DRE performed   Musc/Skeltl: no joint redness or swelling.  Scabbed over incision at site of amputated right Hallux.   Extremities:  No edema.  Brisk cap refill in toes  Neurologic:  Sedated on Diprivan.   Skin:  Vitiligo all over.   Tattoos:  On arms.   Nodes:  No cervical adenopathy   Psych:  Sedated.   Safety mittens in place.     Intake/Output from  previous day: 05/11 0701 - 05/12 0700 In: 2449.5 [I.V.:2249.4; IV Piggyback:200.1] Out: 2875 [Urine:2350; Emesis/NG output:525] Intake/Output this shift: No intake/output data recorded.  LAB RESULTS: Recent Labs    08/24/18 0406 08/24/18 1600 08/25/18 0329  WBC 16.2* 17.3* 16.7*  HGB 9.9* 9.5* 8.7*  HCT 28.8* 27.2* 25.8*  PLT 242 184 158   BMET Lab Results  Component Value Date   NA 151 (H) 08/25/2018   NA 151 (H) 08/24/2018   NA 150 (H) 08/24/2018     K 3.4 (L) 08/25/2018   K 3.5 08/24/2018   K 3.4 (L) 08/24/2018   CL 122 (H) 08/25/2018   CL 118 (H) 08/24/2018   CL 119 (H) 08/24/2018   CO2 21 (L) 08/25/2018   CO2 22 08/24/2018   CO2 21 (L) 08/24/2018   GLUCOSE 185 (H) 08/25/2018   GLUCOSE 114 (H) 08/24/2018   GLUCOSE 143 (H) 08/24/2018   BUN 61 (H) 08/25/2018   BUN 66 (H) 08/24/2018   BUN 72 (H) 08/24/2018   CREATININE 2.85 (H) 08/25/2018   CREATININE 2.91 (H) 08/24/2018   CREATININE 3.13 (H) 08/24/2018   CALCIUM 6.9 (L) 08/25/2018   CALCIUM 6.9 (L) 08/24/2018   CALCIUM 6.7 (L) 08/24/2018   LFT Recent Labs    08/23/18 1100 08/23/18 1413  PROT 6.2* 5.3*  ALBUMIN 2.9* 2.5*  AST 22 26  ALT 26 24  ALKPHOS 146* 133*  BILITOT 1.3* 1.5*   PT/INR Lab Results  Component Value Date   INR 1.0 08/23/2018   INR 0.9 08/28/2016   INR 1.00 07/25/2016   Hepatitis Panel No results for input(s): HEPBSAG, HCVAB, HEPAIGM, HEPBIGM in the last 72 hours. C-Diff No components found for: CDIFF Lipase     Component Value Date/Time   LIPASE 17 08/23/2018 1100    Drugs of Abuse     Component Value Date/Time   LABOPIA NONE DETECTED 08/11/2018 2034   COCAINSCRNUR POSITIVE (A) 08/11/2018 2034   COCAINSCRNUR POS (A) 05/05/2009 2018   LABBENZ NONE DETECTED 08/11/2018 2034   LABBENZ NEG 05/05/2009 2018   AMPHETMU NONE DETECTED 08/11/2018 2034   THCU POSITIVE (A) 08/11/2018 2034   LABBARB NONE DETECTED 08/11/2018 2034     RADIOLOGY STUDIES: Dg Chest 1 View  Result Date: 08/23/2018 CLINICAL DATA:  Central line placement. EXAM: CHEST  1 VIEW COMPARISON:  Radiograph of same day. FINDINGS: The heart size and mediastinal contours are within normal limits. No pneumothorax or pleural effusion is noted. Stable position of right internal jugular catheter with distal tip in expected position of cavoatrial junction. Endotracheal tube is unchanged in position. Both lungs are clear. The visualized skeletal structures are unremarkable.  IMPRESSION: Stable support apparatus. No acute cardiopulmonary abnormality seen. Electronically Signed   By: Marijo Conception M.D.   On: 08/23/2018 15:51   Ct Head Wo Contrast  Result Date: 08/23/2018 CLINICAL DATA:  Unwitnessed fall. EXAM: CT HEAD WITHOUT CONTRAST CT CERVICAL SPINE WITHOUT CONTRAST TECHNIQUE: Multidetector CT imaging of the head and cervical spine was performed following the standard protocol without intravenous contrast. Multiplanar CT image reconstructions of the cervical spine were also generated. COMPARISON:  CT scan of February 03, 2018. FINDINGS: CT HEAD FINDINGS Brain: Mild diffuse cortical atrophy is noted. Mild chronic ischemic white matter disease is noted. Stable small calcified right frontal meningioma. No mass effect or midline shift is noted. There is no evidence of hemorrhage or acute infarction. Vascular: No hyperdense vessel or unexpected calcification. Skull: Normal. Negative for fracture or  focal lesion. Sinuses/Orbits: No acute finding. Other: None. CT CERVICAL SPINE FINDINGS Alignment: Normal. Skull base and vertebrae: No acute fracture. No primary bone lesion or focal pathologic process. Soft tissues and spinal canal: No prevertebral fluid or swelling. No visible canal hematoma. Disc levels: Severe degenerative disc disease is noted at C6-7 with anterior and posterior osteophyte formation. Upper chest: Negative. Other: None. IMPRESSION: Mild diffuse cortical atrophy. Mild chronic ischemic white matter disease. No acute intracranial abnormality seen. Severe degenerative disc disease is noted at C6-7. No acute abnormality seen in the cervical spine. Electronically Signed   By: Marijo Conception M.D.   On: 08/23/2018 13:35   Ct Cervical Spine Wo Contrast  Result Date: 08/23/2018 CLINICAL DATA:  Unwitnessed fall. EXAM: CT HEAD WITHOUT CONTRAST CT CERVICAL SPINE WITHOUT CONTRAST TECHNIQUE: Multidetector CT imaging of the head and cervical spine was performed following the  standard protocol without intravenous contrast. Multiplanar CT image reconstructions of the cervical spine were also generated. COMPARISON:  CT scan of February 03, 2018. FINDINGS: CT HEAD FINDINGS Brain: Mild diffuse cortical atrophy is noted. Mild chronic ischemic white matter disease is noted. Stable small calcified right frontal meningioma. No mass effect or midline shift is noted. There is no evidence of hemorrhage or acute infarction. Vascular: No hyperdense vessel or unexpected calcification. Skull: Normal. Negative for fracture or focal lesion. Sinuses/Orbits: No acute finding. Other: None. CT CERVICAL SPINE FINDINGS Alignment: Normal. Skull base and vertebrae: No acute fracture. No primary bone lesion or focal pathologic process. Soft tissues and spinal canal: No prevertebral fluid or swelling. No visible canal hematoma. Disc levels: Severe degenerative disc disease is noted at C6-7 with anterior and posterior osteophyte formation. Upper chest: Negative. Other: None. IMPRESSION: Mild diffuse cortical atrophy. Mild chronic ischemic white matter disease. No acute intracranial abnormality seen. Severe degenerative disc disease is noted at C6-7. No acute abnormality seen in the cervical spine. Electronically Signed   By: Marijo Conception M.D.   On: 08/23/2018 13:35   Dg Chest Port 1 View  Result Date: 08/24/2018 CLINICAL DATA:  63 year old male with respiratory failure. COVID-19 status pending. EXAM: PORTABLE CHEST 1 VIEW COMPARISON:  Portable chest 08/23/2018 and earlier. FINDINGS: Portable AP semi upright view at 0341 hours. Endotracheal tube tip in good position between the clavicles and carina. An enteric tube has been placed and terminates in the left upper quadrant at the level of the gastric body. Stable right IJ central line. Mildly larger lung volumes. Normal cardiac size and mediastinal contours. Allowing for portable technique the lungs are clear. Paucity of bowel gas. No acute osseous abnormality  identified. IMPRESSION: 1. Stable ET tube and right IJ central line. Enteric tube placed, side hole at the level of the gastric body. 2. No acute cardiopulmonary abnormality. Electronically Signed   By: Genevie Ann M.D.   On: 08/24/2018 05:45   Dg Chest Port 1 View  Result Date: 08/23/2018 CLINICAL DATA:  Status post intubation EXAM: PORTABLE CHEST 1 VIEW COMPARISON:  08/23/2018 FINDINGS: Cardiac shadow is stable. Right jugular central line and endotracheal tube are noted in satisfactory position. No pneumothorax is seen. No focal infiltrate is noted. IMPRESSION: Tubes and lines as described above. No significant interval changes noted. Electronically Signed   By: Inez Catalina M.D.   On: 08/23/2018 15:08   Dg Chest Port 1 View  Result Date: 08/23/2018 CLINICAL DATA:  Altered mental status and possible aspiration. EXAM: PORTABLE CHEST 1 VIEW COMPARISON:  08/12/2018 and prior radiographs FINDINGS: The  cardiomediastinal silhouette is unremarkable. There is no evidence of focal airspace disease, pulmonary edema, suspicious pulmonary nodule/mass, pleural effusion, or pneumothorax. No acute bony abnormalities are identified. Remote LEFT rib fractures again noted. IMPRESSION: No active disease. Electronically Signed   By: Margarette Canada M.D.   On: 08/23/2018 11:20   Dg Abd Portable 1v  Result Date: 08/23/2018 CLINICAL DATA:  OG tube placement EXAM: PORTABLE ABDOMEN - 1 VIEW COMPARISON:  None. FINDINGS: Esophagogastric tube with tip and side port below the diaphragm. Nonobstructive pattern of partially included bowel. IMPRESSION: Esophagogastric tube with tip and side port below the diaphragm. Nonobstructive pattern of partially included bowel. Electronically Signed   By: Eddie Candle M.D.   On: 08/23/2018 19:59     IMPRESSION:   *    CGE, dark stools.  R/o ulcers, gastritis, diabetic gastroparesis.    *    Normocytic anemia.  Hgb drop not unexpected in pt with DKA, glucose > 2500 who received large volume IVF.     *   DKA  *   AKI, improved.  Now with GFR c/w stage 4 CKD.  Stage 3a at baselilne in late 07/2018.    *   Hep C.  SVR in 10/2016, 12/2016, 04/2017 after Harvoni.  Not Hep B immune.   PT/INR normal.  Elevated alk phos and t bili at arrival.  Liver hemangiomas but no parenchymal liver disease on ultrasound in 2018.     *    Hypernatremia.    *   Hypokalemia.    *   Elevated Troponin I to 0.94.      PLAN:     *   EGD at bedside today while pt remains intubated. Left message with son Ola Fawver [phone] regarding obtaining consent.      Azucena Freed  08/25/2018, 8:38 AM Phone [phone]

## 2018-08-25 NOTE — Op Note (Signed)
[facility] Patient Name: [name] Procedure Date : 08/25/2018 MRN: [id number] Attending MD: Justice Britain , MD Date of Birth: 1955/07/26 CSN: [id number] Age: 63 Admit Type: Inpatient Procedure:                Upper GI endoscopy Indications:              Coffee-ground emesis, Melena, Occult blood in stool Providers:                Justice Britain, MD, Raynelle Bring, RN, Marguerita Merles, Technician, Elspeth Cho, Technician,                            Ladona Ridgel, Technician Referring MD:             Collene Gobble, MD Medicines:                General Anesthesia Complications:            No immediate complications. Estimated Blood Loss:     Estimated blood loss was minimal. Procedure:                Pre-Anesthesia Assessment:                           - Prior to the procedure, a History and Physical                            was performed, and patient medications and                            allergies were reviewed. The patient's tolerance of                            previous anesthesia was also reviewed. The risks                            and benefits of the procedure and the sedation                            options and risks were discussed with the patient.                            All questions were answered, and informed consent                            was obtained. Prior Anticoagulants: The patient has                            taken no previous anticoagulant or antiplatelet                            agents. ASA Grade Assessment: IV - A patient with  severe systemic disease that is a constant threat                            to life. After reviewing the risks and benefits,                            the patient was deemed in satisfactory condition to                            undergo the procedure.                           After obtaining informed consent, the endoscope was                        passed under direct vision. Throughout the                            procedure, the patient's blood pressure, pulse, and                            oxygen saturations were monitored continuously. The                            GIF-H190 ([id number]) Olympus gastroscope was                            introduced through the mouth, and advanced to the                            second part of duodenum. The upper GI endoscopy was                            accomplished without difficulty. The patient                            tolerated the procedure. Scope In: Scope Out: Findings:      Diffuse severe mucosal changes characterized by congestion,       discoloration, erythema, friability (with spontaneous bleeding),       granularity, hemorrhagic appearance, inflammation, altered texture and       ulceration were found in the entire esophagus. In many regions there is       a necrotic appearance to the esophagus. Biopsies were taken with a cold       forceps for histology.      One cratered esophageal ulcer and stigmata of recent bleeding was found.       This could have been a Mallory Weiss tear with severe ulceration that is       healing currently. The lesion was 30 mm in largest dimension and extend       into the distal esophagus.      Many non-bleeding cratered, linear and superficial gastric ulcers with a       clean ulcer base (Forrest Class III) were found in the cardia, in the       gastric fundus, in the gastric body and in  the gastric antrum. The       largest lesion was 10 mm in largest dimension. Query some of these to be       NGT trauma or assoicated.      Patchy moderate mucosal changes characterized by discoloration,       flattening, friability (with spontaneous bleeding), inflammation,       smoothness and altered texture were found in the gastric body. Query       regions of gastric ischemia.      Multiple dispersed small erosions were found in the  gastric body, at the       incisura and in the gastric antrum.      No other gross lesions were noted in the entire examined stomach.       Biopsies were taken with a cold forceps for histology and Helicobacter       pylori testing.      Patchy moderately erythematous mucosa without active bleeding was found       in the duodenal bulb, in the first portion of the duodenum and in the       second portion of the duodenum. Impression:               - Congested, discolored, erythematous, friable                            (with spontaneous bleeding), granular, hemorrhagic                            appearing, inflamed, texture changed, ulcerated                            mucosa in the esophagus. Black esophagus in                            portions. Biopsied.                           - Esophageal ulcer vs healing MW tear at distal                            esophagus..                           - Non-bleeding gastric ulcers with a clean ulcer                            base (Forrest Class III).                           - Discolored, flattened, friable (with spontaneous                            bleeding), inflamed, smooth and texture changed                            mucosa in the gastric body. Query some ischemic  changes.                           - Erosive gastropathy. Biopsied for HP.                           - Erythematous duodenopathy. Recommendation:           - The patient will be observed post-procedure,                            until all discharge criteria are met.                           - Return patient to ICU for ongoing care.                           - If patient is extubated he should be a clear                            liquid diet for 24-48 hours.                           - Maintain IV PPI until able to adequately take PO                            PPI BID.                           - Can stop Octreotide.                           -  Would minimize reuse of NGT if possible.                           - Await pathology results.                           - Consider Sucralfate 1 g TID (liquid) - if used                            then would require no medications 1 hour before and                            1 hour after administration.                           - Aggressive anti-emetics.                           - Reasonable to consider Liver Doppler with U/S                            when stable (although no overt varices noted,  severe esophagitis and ulceration could preclude                            evaluation of for signs of portal hypertension.                           - The findings and recommendations were discussed                            with the referring physician.                           - The findings and recommendations were discussed                            with the patient's family. Procedure Code(s):        --- Professional ---                           [phone], Esophagogastroduodenoscopy, flexible,                            transoral; with biopsy, single or multiple Diagnosis Code(s):        --- Professional ---                           K20.9, Esophagitis, unspecified                           K22.8, Other specified diseases of esophagus                           K22.10, Ulcer of esophagus without bleeding                           K25.9, Gastric ulcer, unspecified as acute or                            chronic, without hemorrhage or perforation                           K31.89, Other diseases of stomach and duodenum                           K92.0, Hematemesis                           K92.1, Melena (includes Hematochezia)                           R19.5, Other fecal abnormalities CPT copyright 2019 [employer]. All rights reserved. The codes documented in this report are preliminary and upon coder review may  be revised to meet current  compliance requirements. Justice Britain, MD 08/25/2018 5:10:40 PM Number of Addenda: 0

## 2018-08-25 NOTE — Care Management (Signed)
Pt currently intubated - readmission assessment will be performed at a later time

## 2018-08-25 NOTE — H&P (Signed)
GASTROENTEROLOGY PROCEDURE H&P NOTE   Primary Care Physician: Patient, No Pcp Per  HPI: [name] is a 63 y.o. male who presents for EGD  Past Medical History:  Diagnosis Date  . DEPRESSION   . DIABETES MELLITUS, TYPE I   . DRUG ABUSE    pt should have NO controlled substances rx'ed  . Glaucoma   . HEPATITIS C    chronic  . Hypertension 02/18/2011  . 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 Vitiligo    Past Surgical History:  Procedure Laterality Date  . ABDOMINAL AORTOGRAM W/LOWER EXTREMITY N/A 07/25/2016   Procedure: Abdominal Aortogram w/Bilateral Lower Extremity Runoff;  Surgeon: Conrad Durant, MD;  Location: [place] [facility];  Service: Cardiovascular;  Laterality: N/A;  . ABDOMINAL AORTOGRAM W/LOWER EXTREMITY N/A 12/24/2016   Procedure: ABDOMINAL AORTOGRAM W/LOWER EXTREMITY;  Surgeon: Serafina Mitchell, MD;  Location: [place] [facility];  Service: Cardiovascular;  Laterality: N/A;  . AMPUTATION TOE Right 07/26/2016   Procedure: AMPUTATION GREAT TOE;  Surgeon: Serafina Mitchell, MD;  Location: [place];  Service: Vascular;  Laterality: Right;  . EYE SURGERY     retinal surgery x 2, right eye  . INSERTION OF ILIAC STENT Right 07/26/2016   Procedure: INSERTION OF RIGHT POPLITEAL STENT WITH BALLOON ANGIOPLASTY;  Surgeon: Serafina Mitchell, MD;  Location: [place];  Service: Vascular;  Laterality: Right;  . LOWER EXTREMITY ANGIOGRAM Right 07/26/2016   Procedure: LOWER EXTREMITY ANGIOGRAM;  Surgeon: Serafina Mitchell, MD;  Location: [place];  Service: Vascular;  Laterality: Right;  . PERIPHERAL VASCULAR BALLOON ANGIOPLASTY Right 07/26/2016   Procedure: PERIPHERAL VASCULAR BALLOON ANGIOPLASTY  RIGHT ANTERIOR TIBEAL ARTERY AND RIGHT SUPERFICIAL FEMORAL ARTERY;  Surgeon: Serafina Mitchell, MD;  Location: [place];  Service: Vascular;  Laterality: Right;  . PERIPHERAL VASCULAR INTERVENTION Right 12/24/2016   Procedure: PERIPHERAL VASCULAR INTERVENTION;  Surgeon: Serafina Mitchell, MD;  Location: [place] [facility];  Service: Cardiovascular;  Laterality: Right;  lower extr   Current Facility-Administered Medications  Medication Dose Route Frequency Provider Last Rate Last Dose  . 0.9 %  sodium chloride infusion   Intra-arterial PRN Noemi Chapel, MD      . Ampicillin-Sulbactam (UNASYN) 3 g in sodium chloride 0.9 % 100 mL IVPB  3 g Intravenous Q12H Brand Males, MD   Stopped at 08/25/18 0847  . chlorhexidine gluconate (MEDLINE KIT) (PERIDEX) 0.12 % solution 15 mL  15 mL Mouth Rinse BID Brand Males, MD   15 mL at 08/25/18 0814  . dextrose 5 % solution   Intravenous Continuous Collene Gobble, MD 50 mL/hr at 08/25/18 1400    . insulin aspart (novoLOG) injection 1-5 Units  1-5 Units Subcutaneous Q4H Byrum, Rose Fillers, MD      . MEDLINE mouth rinse  15 mL Mouth Rinse 10 times per day Brand Males, MD   15 mL at 08/25/18 1407  . octreotide (SANDOSTATIN) 500 mcg in sodium chloride 0.9 % 250 mL (2 mcg/mL) infusion  50 mcg/hr Intravenous Continuous Noemi Chapel, MD 25 mL/hr at 08/25/18 1025 50 mcg/hr at 08/25/18 1025  . pantoprazole (PROTONIX) 80 mg in sodium chloride 0.9 % 250 mL (0.32 mg/mL) infusion  8 mg/hr Intravenous Continuous Collene Gobble, MD 25 mL/hr at 08/25/18 0900 8 mg/hr at 08/25/18 0900  . [START ON 08/27/2018] pantoprazole (PROTONIX) injection 40 mg  40 mg Intravenous Q12H Collene Gobble, MD      . propofol (DIPRIVAN) 1000 MG/100ML infusion  5-80 mcg/kg/min Intravenous Continuous Noemi Chapel, MD 14.47 mL/hr at 08/25/18 0900 35 mcg/kg/min at 08/25/18 0900   Allergies  Allergen Reactions  . Codeine Itching    Tolerates hydrocodone/apap   Family History  Problem Relation Age of Onset  . Diabetes Father   . Kidney disease Father   . Arthritis Mother   . Heart disease Mother        CAD   Social History   Socioeconomic History  . Marital status: Single    Spouse name: Not on file  . Number of children: 2  . Years of education: 20  . Highest education level:  Associate degree: occupational, Hotel manager, or vocational program  Occupational History    Employer: UNEMPLOYED    Comment: disabled  Social Needs  . Financial resource strain: Very hard  . Food insecurity:    Worry: Often true    Inability: Often true  . Transportation needs:    Medical: Yes    Non-medical: Yes  Tobacco Use  . Smoking status: Current Every Day Smoker    Packs/day: 1.00    Years: 51.00    Pack years: 51.00    Types: Cigarettes  . Smokeless tobacco: Former Systems developer    Types: Chew    Quit date: 1985  Substance and Sexual Activity  . Alcohol use: No    Alcohol/week: 0.0 standard drinks    Comment: 08/14/14 none  . Drug use: Yes    Types: Marijuana, Heroin, Cocaine, "Crack" cocaine, Fentanyl, Methylphenidate    Comment: s/p rehab x 6, occasional drug use (per pt last  use was 06/2015)  . Sexual activity: Not Currently  Lifestyle  . Physical activity:    Days per week: 0 days    Minutes per session: 0 min  . Stress: Very much  Relationships  . Social connections:    Talks on phone: Never    Gets together: Once a week    Attends religious service: Never    Active member of club or organization: No    Attends meetings of clubs or organizations: Not on file    Relationship status: Never married  . Intimate partner violence:    Fear of current or ex partner: No    Emotionally abused: No    Physically abused: No    Forced sexual activity: No  Other Topics Concern  . Not on file  Social History Narrative   Lives in boarding house/homeless   Ongoing occ drug use-crack   Single, disabled, prev mental health drug counselor.     Physical Exam: Vital signs in last 24 hours: Temp:  [98.8 F (37.1 C)-100.4 F (38 C)] 100.2 F (37.9 C) (05/12 1430) Pulse Rate:  [78-90] 83 (05/12 1430) Resp:  [15-37] 31 (05/12 1430) BP: (105-164)/(44-113) 124/72 (05/12 1430) SpO2:  [97 %-100 %] 100 % (05/12 1430) Arterial Line BP: (69-168)/(38-99) 105/99 (05/12 1430) FiO2 (%):   [40 %] 40 % (05/12 1108) Weight:  [71.7 kg] 71.7 kg (05/12 0353) Last BM Date: (PTA) GEN: NAD EYE: Sclerae anicteric ENT: MMM CV: Tachycardic RESP: Intubated GI: Soft NEURO:  Sedated  Lab Results: Recent Labs    08/24/18 0406 08/24/18 1600 08/25/18 0329  WBC 16.2* 17.3* 16.7*  HGB 9.9* 9.5* 8.7*  HCT 28.8* 27.2* 25.8*  PLT 242 184 158   BMET Recent Labs    08/24/18 1617 08/24/18 2201 08/25/18 0329  NA 150* 151* 151*  K 3.4* 3.5 3.4*  CL 119* 118* 122*  CO2 21* 22  21*  GLUCOSE 143* 114* 185*  BUN 72* 66* 61*  CREATININE 3.13* 2.91* 2.85*  CALCIUM 6.7* 6.9* 6.9*   LFT Recent Labs    08/25/18 1111  PROT 4.6*  ALBUMIN 1.7*  AST 37  ALT 26  ALKPHOS 89  BILITOT 0.5  BILIDIR <0.1  IBILI NOT CALCULATED   PT/INR Recent Labs    08/23/18 1100  LABPROT 13.0  INR 1.0     Impression / Plan: This is a 63 y.o.male who presents for EGD  The risks and benefits of endoscopic evaluation were discussed with the patient; these include but are not limited to the risk of perforation, infection, bleeding, missed lesions, lack of diagnosis, severe illness requiring hospitalization, as well as anesthesia and sedation related illnesses.  The patient is agreeable to proceed.    Justice Britain, MD [facility] Office # [phone]

## 2018-08-25 NOTE — Progress Notes (Signed)
NAME:  [name], MRN:  [id number], DOB:  1955/09/13, LOS: 2 ADMISSION DATE:  08/23/2018, CONSULTATION DATE:  08/23/2018 REFERRING MD:  ED Physician, CHIEF COMPLAINT:  AMS/ Respiratory Failure   Brief History   All History obtained from medical records as patient is intubated and unresponsive upon evaluation. [name] a 63 y.o.malecurrent every day with medical history significant oftype I diabetes with poor control, hypertension, chronic hepatitis C, vitiligo, polysubstance abuse, depression, medication noncompliance, hyperlipidemia who presented to the ER brought in by EMS secondary to blood sugar of 2500. He lives at home with his son and was found on the floor this morning with a mattress on top of him. Per his son he has been " talking gibberish" for 2 days. EMS found patient with Sats of 70% on RA, and BP in 70's . EMS placed a R shoulder IO. Pt. Was hypothermic upon arrival to the ED and required intubation by the ED MD. PCCM have been consulted to admit patient and manage care.  History of present illness   [name] a 63 y.o.malecurrent every day with medical history significant oftype I diabetes with poor control, hypertension, chronic hepatitis C, vitiligo, polysubstance abuse, depression, medication noncompliance, hyperlipidemia who presented to the ER brought in by EMS secondary to blood sugar of 2500, severe diabetic ketoacidosis and hypothermia with rectal temp of 90.3 F . He lives at home with his son and was found on the floor this morning with a mattress on top of him. Unknown down time. Per his son he has been " talking gibberish" for 2 days. EMS found patient with Sats of 70% on RA, and BP in 70's . EMS placed a R shoulder IO. IVF were initiated. He improved initially with IV fluids.Pt. Was hypothermic ( 90.3) upon arrival to the ED and Hypoxemic requiring  intubation by the ED MD. PCCM have been consulted to admit patient and manage care.  In the ED patient was  found in DKA, Serum Glucose of > 2500 with anion gap of 35, Venous ABG revealed Ph of 7.15, CO2 of 26, PO2 of 59, bicarb of 9. WBC was 21,000, CXR showed no active disease.  Lactate was 5, Creatinine was 5.05, CT Head and cervical spine were both negative for acute process, however patient vomited coffee ground emesis after CT was performed . He was started on octreotide and Protonix.He desaturated and was placed on NRB prior to intubation.[name] KitchenHe was intubated to protect his airway. ABG is pending. Right IJ CVC was placed by ED MD. He became hypotensive, he was bolused with 2 L IVF  and was started on Levophed. Ammonia Level was 155, Initial Potassium was 6.4,  Corrected Na  170-190 He was started on an insulin gtt, IVF, and Propofol. Tox screens were + for Cocaine and Mariajuana. PCCM was consulted to admit and manage patient care.  Significant Hospital Events   Admission 5/10 Recent Admission 4/28-5/2>> Persistent hypoglycemia/uncontrolled diabetes mellitus  Consults:    Procedures:  5/10 IO R shoulder >> 5/10 5/10 A line  R IJ CVC 5/10 >>   Significant Diagnostic Tests:  5/10 CT Cervical Spine/ Head IMPRESSION: Mild diffuse cortical atrophy. Mild chronic ischemic white matter disease.  No acute intracranial abnormality seen. Severe degenerative disc disease is noted at C6-7.  No acute abnormality seen in the cervical spine.  01/2018 Echo Normal LV size, [place] thickness, EF 14-43%, grade 1 diastolic dysfunction   Micro Data:  5/10>> COVID 19>> Negative 5/10>>Blood  Cx x 2>> 5/10 Tracheal Aspirate>>   Antimicrobials:  Unasyn 5/10 >>   Interim history/subjective:  Patient was able to wean off insulin drip 5/11, anion gap remains closed Minimal gastric output, remains dark in color, Gastroccult stool positive Has tolerated some PS, remains on sedation currently Episode of hypoglycemia overnight, D5 0.2 NS increased to 50 cc/h  Objective   Blood pressure (!) 154/54, pulse 90,  temperature 99.9 F (37.7 C), temperature source Core, resp. rate (!) 33, weight 71.7 kg, SpO2 99 %. CVP:  [3 mmHg-8 mmHg] 8 mmHg  Vent Mode: PSV;CPAP FiO2 (%):  [40 %] 40 % Set Rate:  [14 bmp] 14 bmp Vt Set:  [620 mL] 620 mL PEEP:  [5 cmH20] 5 cmH20 Pressure Support:  [5 cmH20] 5 cmH20 Plateau Pressure:  [17 cmH20-20 cmH20] 20 cmH20   Intake/Output Summary (Last 24 hours) at 08/25/2018 0737 Last data filed at 08/25/2018 0400 Gross per 24 hour  Intake 2293.45 ml  Output 2850 ml  Net -556.55 ml   Filed Weights   08/24/18 0341 08/25/18 0353  Weight: 67.9 kg 71.7 kg    Examination: General: Ill-appearing man, sedated and intubated HENT: Some dark gastric drainage on intermittent suction, ET tube in place Lungs: Few scattered rhonchi bilaterally, no wheeze Cardiovascular: Regular, no murmur Abdomen: Soft, nondistended, positive bowel sounds Extremities: No significant edema Neuro: Wakes to vigorous stimulation and then immediately back to sleep, moves extremities not currently following commands (propofol 30) GU: Foley cath  Resolved Hospital Problem list     Assessment & Plan:  Severe Diabetic Keto Acidosis, improved Severe Hyperglycemia initial serum glucose 2500 Dehydration  Plan: Insulin drip has been weaned off Continue D5w 50 cc/h Hold Levemir given episode of hypoglycemia overnight Follow BMP, CBG  Acute Respiratory Failure Intubated for Airway Protection ? Aspiration after emesis, chest x-ray reassuring Plan Plan to lighten sedation, continue PS 5/12.  Consider progression to extubation depending on gastroenterology plans Continue Unasyn for now.  Follow chest x-ray 5/13, consider discontinuation if no evolving infiltrate  Acute Renal Failure, improving with volume resuscitation, oliguria resolved Hyperkalemia, resolved Hypokalemia Hypernatremia  Plan IVF Continue to follow urine output, BMP Ensure adequate renal perfusion, avoid nephrotoxins Change IV  fluids to D5W   Shock, likely hypovolemic, consider also septic, source unclear.  Improved Plan Continue gentle volume resuscitation, hemodynamics improved  Stress non-STEMI Plan Recheck troponin 5/12 Heparin and ASA deferred given apparent GI blood loss Follow ECG Likely needs a cardiology consultation once all of his acute issues have been stabilized   Meets sirs criteria, consider sepsis, unclear source.  Procalcitonin positive: 4 > 6 > 2.6 COVID Negative Plan On empiric Unasyn currently for possible aspiration pneumonia.  Tailor to culture data, chest x-ray Blood, respiratory, urine cultures pending   Probable upper GI blood loss Hyperammonemia History hep C, no evidence of cirrhosis by CT 02/02/2018 Plan Continue octreotide for now.  No definitive evidence for underlying liver disease but at some risk given his substance use Continue Protonix infusion Follow CBC We will ask gastroenterology to see him now that his metabolic and hemodynamic issues are stabilizing Follow ammonia, consider lactulose  AMS/ Encephalopathy Plan CT head negative for acute findings Multifactorial, certainly contribution from his metabolic disarray, sedating medications.  We will start to wean propofol 5/12.  Consider EEG, further work-up if he fails to wake up  Best practice:  Diet: NPO Pain/Anxiety/Delirium protocol (if indicated): Propofol VAP protocol (if indicated): Ordered DVT prophylaxis: PAS hose GI prophylaxis: Protonix  gtt Glucose control: SSI per protocol Mobility: BR Code Status: Full Family Communication: unable to reach son on 5/12, will re-attempt Disposition:ICU     Labs   CBC: Recent Labs  Lab 08/23/18 1100  08/23/18 1413  08/23/18 2124 08/24/18 0212 08/24/18 0406 08/24/18 1600 08/25/18 0329  WBC 27.2*  --  21.7*  --   --   --  16.2* 17.3* 16.7*  NEUTROABS 22.0*  --  19.2*  --   --   --   --   --   --   HGB 11.3*   < > 10.4*   < > 9.9* 9.2* 9.9* 9.5* 8.7*   HCT 41.4   < > 34.7*   < > 29.0* 27.0* 28.8* 27.2* 25.8*  MCV 107.0*  --  97.7  --   --   --  84.5 85.0 86.0  PLT 452*  --  363  --   --   --  242 184 158   < > = values in this interval not displayed.    Basic Metabolic Panel: Recent Labs  Lab 08/23/18 1100  08/24/18 0406 08/24/18 0800 08/24/18 1233 08/24/18 1617 08/24/18 2201 08/25/18 0329  NA 134*   < > 150* 148* 151* 150* 151* 151*  K 6.4*   < > 3.4* 4.0 3.6 3.4* 3.5 3.4*  CL 76*   < > 118* 113* 118* 119* 118* 122*  CO2 12*   < > 19* 19* 20* 21* 22 21*  GLUCOSE >2,500*   < > 136* 123* 178* 143* 114* 185*  BUN 107*   < > 92* 85* 81* 72* 66* 61*  CREATININE 5.36*   < > 3.85* 3.66* 3.58* 3.13* 2.91* 2.85*  CALCIUM 8.6*   < > 6.8* 6.6* 6.6* 6.7* 6.9* 6.9*  MG 4.1*  --  2.2  --   --   --   --  1.9  PHOS  --   --  3.4  --   --   --   --  2.9   < > = values in this interval not displayed.   GFR: Estimated Creatinine Clearance: 27.6 mL/min (A) (by C-G formula based on SCr of 2.85 mg/dL (H)). Recent Labs  Lab 08/23/18 1055  08/23/18 1413 08/23/18 1839 08/23/18 2040 08/23/18 2223 08/24/18 0406 08/24/18 1600 08/25/18 0329  PROCALCITON  --   --   --  4.00  --   --  6.00  --  2.63  WBC  --    < > 21.7*  --   --   --  16.2* 17.3* 16.7*  LATICACIDVEN 5.0*  --   --   --  6.2* 5.3* 2.8*  --   --    < > = values in this interval not displayed.    Liver Function Tests: Recent Labs  Lab 08/23/18 1100 08/23/18 1413  AST 22 26  ALT 26 24  ALKPHOS 146* 133*  BILITOT 1.3* 1.5*  PROT 6.2* 5.3*  ALBUMIN 2.9* 2.5*   Recent Labs  Lab 08/23/18 1100  LIPASE 17   Recent Labs  Lab 08/23/18 1055  AMMONIA 155*    ABG    Component Value Date/Time   PHART 7.475 (H) 08/25/2018 0310   PCO2ART 32.6 08/25/2018 0310   PO2ART 72.9 (L) 08/25/2018 0310   HCO3 23.5 08/25/2018 0310   TCO2 20 (L) 08/24/2018 0212   ACIDBASEDEF 5.0 (H) 08/24/2018 0212   O2SAT 94.8 08/25/2018 0310     Coagulation Profile: Recent  Labs  Lab  08/23/18 1100  INR 1.0    Cardiac Enzymes: Recent Labs  Lab 08/23/18 1100 08/23/18 1839 08/23/18 2223 08/24/18 0406  CKTOTAL 181  --   --   --   TROPONINI 0.06* 0.31* 0.61* 0.94*    HbA1C: Hgb A1c MFr Bld  Date/Time Value Ref Range Status  08/13/2018 04:53 AM 9.8 (H) 4.8 - 5.6 % Final    Comment:    (NOTE) Pre diabetes:          5.7%-6.4% Diabetes:              >6.4% Glycemic control for   <7.0% adults with diabetes   01/30/2018 09:22 AM 10.1 (H) 4.8 - 5.6 % Final    Comment:    (NOTE) Pre diabetes:          5.7%-6.4% Diabetes:              >6.4% Glycemic control for   <7.0% adults with diabetes     CBG: Recent Labs  Lab 08/24/18 2317 08/25/18 0320 08/25/18 0321 08/25/18 0346 08/25/18 0707  GLUCAP 96 36* 40* 136* 71    Independent CC time 33 minutes   Baltazar Apo, MD, PhD 08/25/2018, 8:29 AM [facility] [phone] or if no answer [phone]

## 2018-08-26 ENCOUNTER — Inpatient Hospital Stay (HOSPITAL_COMMUNITY): Payer: Medicare HMO

## 2018-08-26 ENCOUNTER — Encounter (HOSPITAL_COMMUNITY): Payer: Self-pay | Admitting: Gastroenterology

## 2018-08-26 ENCOUNTER — Telehealth: Payer: Self-pay

## 2018-08-26 DIAGNOSIS — K209 Esophagitis, unspecified: Secondary | ICD-10-CM

## 2018-08-26 DIAGNOSIS — K250 Acute gastric ulcer with hemorrhage: Secondary | ICD-10-CM

## 2018-08-26 LAB — BASIC METABOLIC PANEL
Anion gap: 7 (ref 5–15)
BUN: 40 mg/dL — ABNORMAL HIGH (ref 8–23)
CO2: 24 mmol/L (ref 22–32)
Calcium: 7.4 mg/dL — ABNORMAL LOW (ref 8.9–10.3)
Chloride: 119 mmol/L — ABNORMAL HIGH (ref 98–111)
Creatinine, Ser: 2.17 mg/dL — ABNORMAL HIGH (ref 0.61–1.24)
GFR calc Af Amer: 37 mL/min — ABNORMAL LOW (ref >60)
GFR calc non Af Amer: 32 mL/min — ABNORMAL LOW (ref >60)
Glucose, Bld: 98 mg/dL (ref 70–99)
Potassium: 3.5 mmol/L (ref 3.5–5.1)
Sodium: 150 mmol/L — ABNORMAL HIGH (ref 135–145)

## 2018-08-26 LAB — CBC
HCT: 26.5 % — ABNORMAL LOW (ref 39.0–52.0)
Hemoglobin: 8.7 g/dL — ABNORMAL LOW (ref 13.0–17.0)
MCH: 29.2 pg (ref 26.0–34.0)
MCHC: 32.8 g/dL (ref 30.0–36.0)
MCV: 88.9 fL (ref 80.0–100.0)
Platelets: 112 10*3/uL — ABNORMAL LOW (ref 150–400)
RBC: 2.98 MIL/uL — ABNORMAL LOW (ref 4.22–5.81)
RDW: 15.3 % (ref 11.5–15.5)
WBC: 21.4 10*3/uL — ABNORMAL HIGH (ref 4.0–10.5)
nRBC: 0 % (ref 0.0–0.2)

## 2018-08-26 LAB — GLUCOSE, CAPILLARY
Glucose-Capillary: 100 mg/dL — ABNORMAL HIGH (ref 70–99)
Glucose-Capillary: 166 mg/dL — ABNORMAL HIGH (ref 70–99)
Glucose-Capillary: 187 mg/dL — ABNORMAL HIGH (ref 70–99)
Glucose-Capillary: 220 mg/dL — ABNORMAL HIGH (ref 70–99)
Glucose-Capillary: 247 mg/dL — ABNORMAL HIGH (ref 70–99)
Glucose-Capillary: 274 mg/dL — ABNORMAL HIGH (ref 70–99)
Glucose-Capillary: 82 mg/dL (ref 70–99)

## 2018-08-26 LAB — PROTIME-INR
INR: 1.1 (ref 0.8–1.2)
Prothrombin Time: 13.9 seconds (ref 11.4–15.2)

## 2018-08-26 LAB — PHOSPHORUS: Phosphorus: 2.9 mg/dL (ref 2.5–4.6)

## 2018-08-26 LAB — AMMONIA: Ammonia: 13 umol/L (ref 9–35)

## 2018-08-26 LAB — TROPONIN I: Troponin I: 0.22 ng/mL (ref <0.03)

## 2018-08-26 LAB — MAGNESIUM: Magnesium: 1.8 mg/dL (ref 1.7–2.4)

## 2018-08-26 MED ORDER — SODIUM CHLORIDE 0.9 % IV SOLN
3.0000 g | Freq: Three times a day (TID) | INTRAVENOUS | Status: DC
  Administered 2018-08-26 – 2018-08-27 (×4): 3 g via INTRAVENOUS
  Filled 2018-08-26 (×6): qty 3

## 2018-08-26 MED ORDER — FREE WATER: 200.0000 mL | Freq: Four times a day (QID) | Status: DC

## 2018-08-26 MED ORDER — POTASSIUM CHLORIDE 20 MEQ/15ML (10%) PO SOLN
20.0000 meq | Freq: Once | ORAL | Status: DC
  Filled 2018-08-26: qty 15

## 2018-08-26 MED ORDER — POTASSIUM CHLORIDE 10 MEQ/50ML IV SOLN
10.0000 meq | INTRAVENOUS | Status: AC
  Administered 2018-08-26 (×2): 10 meq via INTRAVENOUS
  Filled 2018-08-26 (×2): qty 50

## 2018-08-26 MED ORDER — MAGNESIUM SULFATE 2 GM/50ML IV SOLN
2.0000 g | Freq: Once | INTRAVENOUS | Status: AC
  Administered 2018-08-26: 2 g via INTRAVENOUS
  Filled 2018-08-26: qty 50

## 2018-08-26 MED ORDER — DEXTROSE 5 % IV SOLN: INTRAVENOUS | Status: AC

## 2018-08-26 MED ORDER — INSULIN GLARGINE 100 UNIT/ML ~~LOC~~ SOLN
7.0000 [IU] | Freq: Every day | SUBCUTANEOUS | Status: DC
  Administered 2018-08-26 – 2018-08-27 (×2): 7 [IU] via SUBCUTANEOUS
  Filled 2018-08-26 (×2): qty 0.07

## 2018-08-26 NOTE — Progress Notes (Signed)
eLink Physician-Brief Progress Note Patient Name: [name] DOB: 28-Jan-1956 MRN: [id number]   Date of Service  08/26/2018  HPI/Events of Note  Progressive hyperglycemia. Pt is a diabetic receiving iv dextrose infusion without Long acting insulin coverage, blood sugar has been rising progressively  eICU Interventions  Reduce glucose infusion to 50 ml/ hour, Lantus insulin 7 units Q HS beginning tonight, continue with current dosing of coverage Novolog insulin        Philomena Buttermore U Accalia Rigdon 08/26/2018, 8:47 PM

## 2018-08-26 NOTE — Progress Notes (Signed)
CSW will complete assessment whenever patient is able to participate.  Madilyn Fireman, MSW, [place] Clinical Social Worker Emergency Department [employer] [phone]

## 2018-08-26 NOTE — Progress Notes (Signed)
Reached out to nurse at [facility] Endocrinology office about note written that patient had passed away. The nurse was notified by family that pt was decreased when she called about a future appoiontment. Pt is currently in ICU not decreased. I attempted to called pt's son to inform him patient is not decreased but no answer from son. I left patient's son a voicemail to call the unit back.

## 2018-08-26 NOTE — Progress Notes (Signed)
NAME:  [name], MRN:  [id number], DOB:  1955/12/17, LOS: 3 ADMISSION DATE:  08/23/2018, CONSULTATION DATE:  08/23/2018 REFERRING MD:  ED Physician, CHIEF COMPLAINT:  AMS/ Respiratory Failure   Brief History   All History obtained from medical records as patient is intubated and unresponsive upon evaluation. [name] a 63 y.o.malecurrent every day with medical history significant oftype I diabetes with poor control, hypertension, chronic hepatitis C, vitiligo, polysubstance abuse, depression, medication noncompliance, hyperlipidemia who presented to the ER brought in by EMS secondary to blood sugar of 2500. He lives at home with his son and was found on the floor this morning with a mattress on top of him. Per his son he has been " talking gibberish" for 2 days. EMS found patient with Sats of 70% on RA, and BP in 70's . EMS placed a R shoulder IO. Pt. Was hypothermic upon arrival to the ED and required intubation by the ED MD. PCCM have been consulted to admit patient and manage care.  History of present illness   [name] a 63 y.o.malecurrent every day with medical history significant oftype I diabetes with poor control, hypertension, chronic hepatitis C, vitiligo, polysubstance abuse, depression, medication noncompliance, hyperlipidemia who presented to the ER brought in by EMS secondary to blood sugar of 2500, severe diabetic ketoacidosis and hypothermia with rectal temp of 90.3 F . He lives at home with his son and was found on the floor this morning with a mattress on top of him. Unknown down time. Per his son he has been " talking gibberish" for 2 days. EMS found patient with Sats of 70% on RA, and BP in 70's . EMS placed a R shoulder IO. IVF were initiated. He improved initially with IV fluids.Pt. Was hypothermic ( 90.3) upon arrival to the ED and Hypoxemic requiring  intubation by the ED MD. PCCM have been consulted to admit patient and manage care.  In the ED patient was  found in DKA, Serum Glucose of > 2500 with anion gap of 35, Venous ABG revealed Ph of 7.15, CO2 of 26, PO2 of 59, bicarb of 9. WBC was 21,000, CXR showed no active disease.  Lactate was 5, Creatinine was 5.05, CT Head and cervical spine were both negative for acute process, however patient vomited coffee ground emesis after CT was performed . He was started on octreotide and Protonix.He desaturated and was placed on NRB prior to intubation.Marland KitchenHe was intubated to protect his airway. ABG is pending. Right IJ CVC was placed by ED MD. He became hypotensive, he was bolused with 2 L IVF  and was started on Levophed. Ammonia Level was 155, Initial Potassium was 6.4,  Corrected Na  170-190 He was started on an insulin gtt, IVF, and Propofol. Tox screens were + for Cocaine and Mariajuana. PCCM was consulted to admit and manage patient care.  Significant Hospital Events   Admission 5/10 Recent Admission 4/28-5/2>> Persistent hypoglycemia/uncontrolled diabetes mellitus  Consults:  GI  Procedures:  5/10 IO R shoulder >> 5/10 5/10 A line  R IJ CVC 5/10 >>   Significant Diagnostic Tests:  5/10 CT Cervical Spine/ Head IMPRESSION: Mild diffuse cortical atrophy. Mild chronic ischemic white matter disease.  No acute intracranial abnormality seen. Severe degenerative disc disease is noted at C6-7.  No acute abnormality seen in the cervical spine.   01/2018 Echo Normal LV size, [place] thickness, EF 02-63%, grade 1 diastolic dysfunction   7/85 EGD Esophagitis without active bleeding.  Old areas of perforation in the stomach and duodenum  Micro Data:  5/10>> COVID 19>> Negative 5/10>>Blood Cx x 2>> 5/10 Tracheal Aspirate>>   Antimicrobials:  Unasyn 5/10 >>   Interim history/subjective:  Currently on pressure support 5/5 but remains sedated we will continue to assess for possible extubation although in his weakened state he may require reintubation.  Objective   Blood pressure (!) 122/53, pulse 78,  temperature 100 F (37.8 C), resp. rate (!) 29, weight 72.1 kg, SpO2 100 %. CVP:  [3 mmHg-6 mmHg] 6 mmHg  Vent Mode: PSV;CPAP FiO2 (%):  [40 %] 40 % Set Rate:  [14 bmp] 14 bmp Vt Set:  [620 mL] 620 mL PEEP:  [5 cmH20] 5 cmH20 Pressure Support:  [5 cmH20] 5 cmH20 Plateau Pressure:  [19 cmH20] 19 cmH20   Intake/Output Summary (Last 24 hours) at 08/26/2018 0859 Last data filed at 08/26/2018 0600 Gross per 24 hour  Intake 2804.63 ml  Output 2030 ml  Net 774.63 ml   Filed Weights   08/24/18 0341 08/25/18 0353 08/26/18 0436  Weight: 67.9 kg 71.7 kg 72.1 kg    Examination: General: Disheveled male who is currently sedated with propofol HEENT: Endo tracheal tube in place Neuro: Poorly responsive.  Requires vigorous stimulation to wake up CV: Heart sounds are regular PULM: even/non-labored, lungs bilaterally diminished in the base GI: Nontender, faint bowel sounds are heard Extremities: warm/dry, 1+ edema  Skin: no rashes or lesions   Resolved Hospital Problem list     Assessment & Plan:  Severe Diabetic Keto Acidosis, improved Severe Hyperglycemia initial serum glucose 2500 Dehydration  CBG (last 3)  Recent Labs    08/26/18 0001 08/26/18 0354 08/26/18 0719  GLUCAP 100* 82 166*    Plan: Insulin drip off Continue D5w 50 cc/h Change in Levemir as per diabetes coronary recommendation `Monitor CBG  Acute Respiratory Failure Intubated for Airway Protection ? Aspiration after emesis, chest x-ray reassuring Plan Light sedation on 07/27/2018 Assess for possible extubation  Acute Renal Failure, improving with volume resuscitation, oliguria resolved Hyperkalemia, resolved Hypokalemia Hypernatremia  Lab Results  Component Value Date   CREATININE 2.17 (H) 08/26/2018   CREATININE 2.15 (H) 08/25/2018   CREATININE 2.85 (H) 08/25/2018   CREATININE 1.17 04/29/2017   CREATININE 1.04 08/28/2016   Recent Labs  Lab 08/25/18 0329 08/25/18 1822 08/26/18 0348  K 3.4* 3.6  3.5    Plan Continue IV fluids D5W Monitor urine output Nephrotoxic avoid    Shock, likely hypovolemic, consider also septic, source unclear.  Improved-resolved Plan Off vasopressor support Continue to monitor  Stress non-STEMI Plan Troponins are noted to be trending down Heparin and aspirin are deferred at this time due to GI bleed Monitor twelve-lead Possible cardiology consult near future   Meets sirs criteria, consider sepsis, unclear source.  Procalcitonin positive: 4 > 6 > 2.6 COVID Negative Plan Currently on Unasyn for questionable aspiration Will obtain tracheal aspirate prior to any attempted extubation White count is going up therefore we will continue antibiotics   Probable upper GI blood loss Hyperammonemia History hep C, no evidence of cirrhosis by CT 02/02/2018 Status post EGD on 08/25/1998 findings consistent with erosive esophagitis with multiple ulcers with suspected overlying Mallory-Weiss tear.  Per GI if extubated should be on clear liquid diet for 48 hours start avoid NG tube. Plan Octreotide is been discontinued per gastroenterology Now on twice daily Protonix Ammonia levels decreased Gastroenterology has been following No need for lactulose as ammonia level is decreasing If  extubated needs to be on clear liquid diet for 24 to 48 hours  AMS/ Encephalopathy Plan CT of the head was negative Wean propofol May need further neurology work-up near future  Best practice:  Diet: NPO Pain/Anxiety/Delirium protocol (if indicated): Propofol VAP protocol (if indicated): Ordered DVT prophylaxis: PAS hose GI prophylaxis: Protonix gtt Glucose control: SSI per protocol Mobility: BR Code Status: Full Family Communication: 08/26/2018 no family contact at this time. Disposition:ICU     Labs   CBC: Recent Labs  Lab 08/23/18 1100  08/23/18 1413  08/24/18 0406 08/24/18 1600 08/25/18 0329 08/25/18 1822 08/26/18 0348  WBC 27.2*  --  21.7*  --   16.2* 17.3* 16.7* 18.9* 21.4*  NEUTROABS 22.0*  --  19.2*  --   --   --   --   --   --   HGB 11.3*   < > 10.4*   < > 9.9* 9.5* 8.7* 9.5* 8.7*  HCT 41.4   < > 34.7*   < > 28.8* 27.2* 25.8* 28.7* 26.5*  MCV 107.0*  --  97.7  --  84.5 85.0 86.0 87.8 88.9  PLT 452*  --  363  --  242 184 158 118* 112*   < > = values in this interval not displayed.    Basic Metabolic Panel: Recent Labs  Lab 08/23/18 1100  08/24/18 0406  08/24/18 1617 08/24/18 2201 08/25/18 0329 08/25/18 1822 08/26/18 0348  NA 134*   < > 150*   < > 150* 151* 151* 151* 150*  K 6.4*   < > 3.4*   < > 3.4* 3.5 3.4* 3.6 3.5  CL 76*   < > 118*   < > 119* 118* 122* 121* 119*  CO2 12*   < > 19*   < > 21* 22 21* 21* 24  GLUCOSE >2,500*   < > 136*   < > 143* 114* 185* 105* 98  BUN 107*   < > 92*   < > 72* 66* 61* 48* 40*  CREATININE 5.36*   < > 3.85*   < > 3.13* 2.91* 2.85* 2.15* 2.17*  CALCIUM 8.6*   < > 6.8*   < > 6.7* 6.9* 6.9* 7.3* 7.4*  MG 4.1*  --  2.2  --   --   --  1.9  --  1.8  PHOS  --   --  3.4  --   --   --  2.9  --  2.9   < > = values in this interval not displayed.   GFR: Estimated Creatinine Clearance: 36.5 mL/min (A) (by C-G formula based on SCr of 2.17 mg/dL (H)). Recent Labs  Lab 08/23/18 1055  08/23/18 1839 08/23/18 2040 08/23/18 2223 08/24/18 0406 08/24/18 1600 08/25/18 0329 08/25/18 1822 08/26/18 0348  PROCALCITON  --   --  4.00  --   --  6.00  --  2.63  --   --   WBC  --    < >  --   --   --  16.2* 17.3* 16.7* 18.9* 21.4*  LATICACIDVEN 5.0*  --   --  6.2* 5.3* 2.8*  --   --   --   --    < > = values in this interval not displayed.    Liver Function Tests: Recent Labs  Lab 08/23/18 1100 08/23/18 1413 08/25/18 1111  AST 22 26 37  ALT 26 24 26   ALKPHOS 146* 133* 89  BILITOT 1.3* 1.5* 0.5  PROT 6.2* 5.3* 4.6*  ALBUMIN 2.9* 2.5* 1.7*   Recent Labs  Lab 08/23/18 1100  LIPASE 17   Recent Labs  Lab 08/23/18 1055 08/25/18 1111 08/26/18 0348  AMMONIA 155* 21 13    ABG     Component Value Date/Time   PHART 7.475 (H) 08/25/2018 0310   PCO2ART 32.6 08/25/2018 0310   PO2ART 72.9 (L) 08/25/2018 0310   HCO3 23.5 08/25/2018 0310   TCO2 20 (L) 08/24/2018 0212   ACIDBASEDEF 5.0 (H) 08/24/2018 0212   O2SAT 94.8 08/25/2018 0310     Coagulation Profile: Recent Labs  Lab 08/23/18 1100 08/26/18 0348  INR 1.0 1.1    Cardiac Enzymes: Recent Labs  Lab 08/23/18 1100 08/23/18 1839 08/23/18 2223 08/24/18 0406 08/25/18 1111 08/26/18 0348  CKTOTAL 181  --   --   --   --   --   TROPONINI 0.06* 0.31* 0.61* 0.94* 0.38* 0.22*    HbA1C: Hgb A1c MFr Bld  Date/Time Value Ref Range Status  08/13/2018 04:53 AM 9.8 (H) 4.8 - 5.6 % Final    Comment:    (NOTE) Pre diabetes:          5.7%-6.4% Diabetes:              >6.4% Glycemic control for   <7.0% adults with diabetes   01/30/2018 09:22 AM 10.1 (H) 4.8 - 5.6 % Final    Comment:    (NOTE) Pre diabetes:          5.7%-6.4% Diabetes:              >6.4% Glycemic control for   <7.0% adults with diabetes     CBG: Recent Labs  Lab 08/25/18 2019 08/25/18 2330 08/26/18 0001 08/26/18 0354 08/26/18 0719  GLUCAP 75 56* 100* 82 166*    App CCT  30 min  Richardson Landry Lourene Hoston ACNP Maryanna Shape PCCM Pager [phone] till 1 pm If no answer page [phone] 08/26/2018, 8:59 AM

## 2018-08-26 NOTE — Telephone Encounter (Signed)
Called to reschedule appt. Informed by family that pt has passed away 2 days ago. Extended our condolences for their loss.

## 2018-08-26 NOTE — Progress Notes (Signed)
PHARMACY NOTE:  ANTIMICROBIAL RENAL DOSAGE ADJUSTMENT  Current antimicrobial regimen includes a mismatch between antimicrobial dosage and estimated renal function.  As per policy approved by the Pharmacy & Therapeutics and Medical Executive Committees, the antimicrobial dosage will be adjusted accordingly.  Current antimicrobial dosage:  Unasyn 3gm IV q12h  Indication: aspiration PNA  Renal Function:  Estimated Creatinine Clearance: 36.6 mL/min (A) (by C-G formula based on SCr of 2.15 mg/dL (H)).     Antimicrobial dosage has been changed to:  Unasyn 3gm IV q8h   Thank you for allowing pharmacy to be a part of this patient's care.  Sherlon Handing, PharmD, BCPS Clinical pharmacist  **Pharmacist phone directory can now be found on [email or web address] ([email or web address]).  Listed under [place]. 08/26/2018 12:53 AM

## 2018-08-26 NOTE — Progress Notes (Signed)
Initial Nutrition Assessment RD working remotely.  DOCUMENTATION CODES:   Not applicable  INTERVENTION:   If unable to extubate within the next 24-48 hours, recommend placing Cortrak tube for nutrition support. Cortrak service available Mondays and Fridays or can send to IR/Fluoroscopy for small bore feeding tube placement.  Vital AF 1.2 with goal rate of 75 ml/h (1800 ml per day) would provide 2160 kcal, 135 gm protein, 1460 ml free water daily  When extubated, will likely need PO supplements to help meet increased nutrition needs.  NUTRITION DIAGNOSIS:   Inadequate oral intake related to inability to eat as evidenced by NPO status.  GOAL:   Patient will meet greater than or equal to 90% of their needs  MONITOR:   Vent status, Labs, Skin, I & O's  REASON FOR ASSESSMENT:   Ventilator    ASSESSMENT:   63 yo male with PMH of type 1 DM, hepatitis C, drug abuse, diabetic retinopathy, HTN, vitiligo. Admitted with hyperglycemia (glucose 2500), DKA, hypothermia, and gastritis. Tox screens positive for cocaine and marijuana.   S/P EGD 5/12: severe esophagitis, distal esophageal ulcer, old stomach & duodenum perforations, no active bleeding.  Patient is currently intubated on ventilator support MV: 15.3 L/min Temp (24hrs), Avg:100.1 F (37.8 C), Min:99.3 F (37.4 C), Max:100.8 F (38.2 C)   Trying to wean and extubate, but mental status has been a barrier.  GI following with plans for clear liquid diet when extubated. If unable to extubate soon will likely need Cortrak tube for TF. Currently has no enteral access. Has not received any nutrition since admission. Cortrak preferred over NG or OG tubes.    Labs reviewed. Sodium 150 (H), BUN 40 (H), creatinine 2.17 (H) CBG's: [phone]  Medications reviewed and include Novolog.  Propofol has been stopped.   Patient is at increased nutrition risk, given hx of polysubstance abuse and NPO status since admission.    NUTRITION  - FOCUSED PHYSICAL EXAM:  unable to complete-working remotely  Diet Order:   Diet Order            Diet NPO time specified  Diet effective now              EDUCATION NEEDS:   No education needs have been identified at this time  Skin:  Skin Assessment: Reviewed RN Assessment  Last BM:  5/12  Height:   Ht Readings from Last 1 Encounters:  08/12/18 6' (1.829 m)    Weight:   Wt Readings from Last 1 Encounters:  08/26/18 72.1 kg    Ideal Body Weight:  80.9 kg  BMI:  Body mass index is 21.56 kg/m.  Estimated Nutritional Needs:   Kcal:  2150  Protein:  105-120 gm  Fluid:  2.2 L    Molli Barrows, RD, LDN, CNSC Pager [phone] After Hours Pager [phone]

## 2018-08-26 NOTE — Progress Notes (Addendum)
Daily Rounding Note  08/26/2018, 8:48 AM  LOS: 3 days   SUBJECTIVE:   Chief complaint: esophagitis, gastritis, duodenitis Remains intubated on vent.   Weaning trial in progress. No BM's reported.  OBJECTIVE:         Vital signs in last 24 hours:    Temp:  [99.3 F (37.4 C)-100.8 F (38.2 C)] 100 F (37.8 C) (05/13 0600) Pulse Rate:  [77-90] 78 (05/13 0722) Resp:  [19-36] 29 (05/13 0722) BP: (104-164)/(45-86) 122/53 (05/13 0722) SpO2:  [94 %-100 %] 100 % (05/13 0722) Arterial Line BP: (62-216)/(44-213) 84/79 (05/13 0000) FiO2 (%):  [40 %] 40 % (05/13 0722) Weight:  [72.1 kg] 72.1 kg (05/13 0436) Last BM Date: 08/25/18 Filed Weights   08/24/18 0341 08/25/18 0353 08/26/18 0436  Weight: 67.9 kg 71.7 kg 72.1 kg   General: sedated.  Did not respond to my exam or voice.     Heart: RRR Chest: clear bil in front.  Breathing steady and quiet on vent Abdomen: soft, ND, NT.  BS hypoactive.  Extremities: some non-pitting swelling in feet, arms Neuro/Psych:  Unresponsive to exam.  Did not react to my tickling his feet  Intake/Output from previous day: 05/12 0701 - 05/13 0700 In: 2804.6 [I.V.:2504.6; IV Piggyback:300] Out: 2030 [Urine:2030]  Intake/Output this shift: No intake/output data recorded.  Lab Results: Recent Labs    08/25/18 0329 08/25/18 1822 08/26/18 0348  WBC 16.7* 18.9* 21.4*  HGB 8.7* 9.5* 8.7*  HCT 25.8* 28.7* 26.5*  PLT 158 118* 112*   BMET Recent Labs    08/25/18 0329 08/25/18 1822 08/26/18 0348  NA 151* 151* 150*  K 3.4* 3.6 3.5  CL 122* 121* 119*  CO2 21* 21* 24  GLUCOSE 185* 105* 98  BUN 61* 48* 40*  CREATININE 2.85* 2.15* 2.17*  CALCIUM 6.9* 7.3* 7.4*   LFT Recent Labs    08/23/18 1100 08/23/18 1413 08/25/18 1111  PROT 6.2* 5.3* 4.6*  ALBUMIN 2.9* 2.5* 1.7*  AST 22 26 37  ALT '26 24 26  '$ ALKPHOS 146* 133* 89  BILITOT 1.3* 1.5* 0.5  BILIDIR  --   --  <0.1  IBILI  --   --   NOT CALCULATED   PT/INR Recent Labs    08/23/18 1100 08/26/18 0348  LABPROT 13.0 13.9  INR 1.0 1.1   Hepatitis Panel No results for input(s): HEPBSAG, HCVAB, HEPAIGM, HEPBIGM in the last 72 hours.  Studies/Results: Dg Chest Port 1 View  Result Date: 08/26/2018 CLINICAL DATA:  Acute respiratory failure. EXAM: PORTABLE CHEST 1 VIEW COMPARISON:  Radiographs of Aug 24, 2018. FINDINGS: Endotracheal tube is in good position. Right internal jugular catheter is unchanged. Nasogastric tube is no longer present. No pneumothorax or pleural effusion is noted. Right lung is clear. Increased left basilar opacity is noted concerning for pneumonia or atelectasis. Bony thorax is unremarkable. IMPRESSION: Increased left basilar opacity is noted concerning for worsening pneumonia or atelectasis. Electronically Signed   By: Marijo Conception M.D.   On: 08/26/2018 07:47    ASSESMENT:   *   CGE, dark stools.    08/25/18 EGD: severe esophagitis with erythema, friability, inflamed, ulcerated mucosa, also portion appearing black/necrotic.  Distal esoph ulcer, ? Healing MWT?.  Non-bleeding, clean-based gastric ulcers, ? Ischemic.  Gastric erosions.  Duodenal erythema.  Multiple  Biopsies obtained.   tox screen cocaine + at arrival.    *    Normocytic anemia.    *  Rising WBCs.  CXR concerning for PNA.    *   Thrombocytopenia.    *   DKA  *   AKI, improved.  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  Stage 3a at baselilne in late 07/2018.    *   Hep C.  SVR in 10/2016, 12/2016, 04/2017 after Harvoni.  Not Hep B immune.   PT/INR normal.  Elevated alk phos and t bili at arrival, now WNL.  PT/INR normal.  Liver hemangiomas but no parenchymal liver disease on ultrasound in 2018.     *    Hypernatremia.      PLAN   *   Protonix 40 mg IV BID. Consider Sucralfate 1g TID when able to swallow.   *   Awaiting biopsy path results.     *   Avoid NGT if possible.  However if not extubated in next 24 hours, might need  Cortrack FT (preferable to NG or OG tube)    Azucena Freed  08/26/2018, 8:48 AM Phone [phone]

## 2018-08-26 NOTE — Telephone Encounter (Signed)
Received an email from [facility] requesting I call [phone] and a secure chat from Deboraha Sprang, RN asking me to review this pt encounter 08/26/18 @1340 . Called [phone] at [facility] request and spoke with [facility] Informed Felicia about the nature of my call to the pt and the pt's family's response to my call. Advised me that the pt has been extubated and doing well. Further added that it seems this patient's family "misinformed" me about this patient's status/condition.

## 2018-08-27 ENCOUNTER — Inpatient Hospital Stay (HOSPITAL_COMMUNITY): Payer: Medicare HMO

## 2018-08-27 LAB — CBC WITH DIFFERENTIAL/PLATELET
Abs Immature Granulocytes: 0.31 10*3/uL — ABNORMAL HIGH (ref 0.00–0.07)
Basophils Absolute: 0.1 10*3/uL (ref 0.0–0.1)
Basophils Relative: 0 %
Eosinophils Absolute: 0.7 10*3/uL — ABNORMAL HIGH (ref 0.0–0.5)
Eosinophils Relative: 5 %
HCT: 26.7 % — ABNORMAL LOW (ref 39.0–52.0)
Hemoglobin: 8.7 g/dL — ABNORMAL LOW (ref 13.0–17.0)
Immature Granulocytes: 2 %
Lymphocytes Relative: 8 %
Lymphs Abs: 1 10*3/uL (ref 0.7–4.0)
MCH: 29.4 pg (ref 26.0–34.0)
MCHC: 32.6 g/dL (ref 30.0–36.0)
MCV: 90.2 fL (ref 80.0–100.0)
Monocytes Absolute: 0.7 10*3/uL (ref 0.1–1.0)
Monocytes Relative: 5 %
Neutro Abs: 10.9 10*3/uL — ABNORMAL HIGH (ref 1.7–7.7)
Neutrophils Relative %: 80 %
Platelets: 107 10*3/uL — ABNORMAL LOW (ref 150–400)
RBC: 2.96 MIL/uL — ABNORMAL LOW (ref 4.22–5.81)
RDW: 15.4 % (ref 11.5–15.5)
WBC: 13.5 10*3/uL — ABNORMAL HIGH (ref 4.0–10.5)
nRBC: 0 % (ref 0.0–0.2)

## 2018-08-27 LAB — BASIC METABOLIC PANEL
Anion gap: 10 (ref 5–15)
BUN: 26 mg/dL — ABNORMAL HIGH (ref 8–23)
CO2: 25 mmol/L (ref 22–32)
Calcium: 7.9 mg/dL — ABNORMAL LOW (ref 8.9–10.3)
Chloride: 115 mmol/L — ABNORMAL HIGH (ref 98–111)
Creatinine, Ser: 1.61 mg/dL — ABNORMAL HIGH (ref 0.61–1.24)
GFR calc Af Amer: 53 mL/min — ABNORMAL LOW (ref >60)
GFR calc non Af Amer: 45 mL/min — ABNORMAL LOW (ref >60)
Glucose, Bld: 225 mg/dL — ABNORMAL HIGH (ref 70–99)
Potassium: 3.4 mmol/L — ABNORMAL LOW (ref 3.5–5.1)
Sodium: 150 mmol/L — ABNORMAL HIGH (ref 135–145)

## 2018-08-27 LAB — GLUCOSE, CAPILLARY
Glucose-Capillary: 176 mg/dL — ABNORMAL HIGH (ref 70–99)
Glucose-Capillary: 177 mg/dL — ABNORMAL HIGH (ref 70–99)
Glucose-Capillary: 179 mg/dL — ABNORMAL HIGH (ref 70–99)
Glucose-Capillary: 208 mg/dL — ABNORMAL HIGH (ref 70–99)
Glucose-Capillary: 208 mg/dL — ABNORMAL HIGH (ref 70–99)
Glucose-Capillary: 230 mg/dL — ABNORMAL HIGH (ref 70–99)
Glucose-Capillary: 36 mg/dL — CL (ref 70–99)

## 2018-08-27 LAB — MAGNESIUM: Magnesium: 2.2 mg/dL (ref 1.7–2.4)

## 2018-08-27 LAB — PHOSPHORUS: Phosphorus: 2.3 mg/dL — ABNORMAL LOW (ref 2.5–4.6)

## 2018-08-27 MED ORDER — SODIUM CHLORIDE 0.9 % IV SOLN
3.0000 g | Freq: Four times a day (QID) | INTRAVENOUS | Status: AC
  Administered 2018-08-27 – 2018-08-29 (×10): 3 g via INTRAVENOUS
  Filled 2018-08-27 (×11): qty 3

## 2018-08-27 MED ORDER — DEXTROSE 5 % IV SOLN
INTRAVENOUS | Status: AC
  Administered 2018-08-27: via INTRAVENOUS

## 2018-08-27 MED ORDER — SODIUM CHLORIDE 0.9 % IV SOLN
3.0000 g | Freq: Four times a day (QID) | INTRAVENOUS | Status: DC
  Filled 2018-08-27 (×2): qty 3

## 2018-08-27 MED ORDER — INSULIN GLARGINE 100 UNIT/ML ~~LOC~~ SOLN
10.0000 [IU] | Freq: Every day | SUBCUTANEOUS | Status: DC
  Administered 2018-08-28: 10 [IU] via SUBCUTANEOUS
  Filled 2018-08-27 (×4): qty 0.1

## 2018-08-27 NOTE — Progress Notes (Signed)
Pharmacy Antibiotic Note  [name] is a 63 y.o. male admitted on 08/23/2018 with concerns for aspiration pneumonia. Pharmacy has been consulted for Unasyn dosing - day #5. AKI resolving - SCr down to 1.61, CrCl~50.  Plan: Increase Unasyn to 3g IV q6h Monitor clinical status, renal function, cultures Planning 7 days total therapy per CCM - stop date placed Pharmacy will s/o consult and monitor peripherally  Temp (24hrs), Avg:99.4 F (37.4 C), Min:98.8 F (37.1 C), Max:100 F (37.8 C)  Recent Labs  Lab 08/23/18 1055  08/23/18 2040 08/23/18 2223  08/24/18 0406  08/24/18 1600  08/24/18 2201 08/25/18 0329 08/25/18 1822 08/26/18 0348 08/27/18 0430  WBC  --    < >  --   --   --  16.2*  --  17.3*  --   --  16.7* 18.9* 21.4* 13.5*  CREATININE  --    < > 4.46*  --    < > 3.85*   < >  --    < > 2.91* 2.85* 2.15* 2.17* 1.61*  LATICACIDVEN 5.0*  --  6.2* 5.3*  --  2.8*  --   --   --   --   --   --   --   --    < > = values in this interval not displayed.    Estimated Creatinine Clearance: 49.7 mL/min (A) (by C-G formula based on SCr of 1.61 mg/dL (H)).    Allergies  Allergen Reactions  . Codeine Itching    Tolerates hydrocodone/apap    Antimicrobials this admission: Unasyn 5/10 >>(5/16)  Microbiology results: 5/13 TA -  5/10 BCx - ngtd 5/10 covid - negative 5/10 MRSA PCR - negative  Elicia Lamp, PharmD, BCPS Please check AMION for all [place] contact numbers Clinical Pharmacist 08/27/2018 10:33 AM

## 2018-08-27 NOTE — Procedures (Signed)
Extubation Procedure Note  Patient Details:   Name: PATTERSON HOLLENBAUGH DOB: December 18, 1955 MRN: [id number]   Airway Documentation:    Vent end date: 08/27/18 Vent end time: 1050   Evaluation  O2 sats: stable throughout Complications: No apparent complications Patient did tolerate procedure well. Bilateral Breath Sounds: Clear, Diminished   Yes  Pt suctioned via ETT and orally then extubated per physician order. Pt with good cough, no stridor heard and able to speak name. Pt on 2L nasal cannula. RRT will continue to monitor.   Sharla Kidney 08/27/2018, 10:53 AM
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
## 2018-08-27 NOTE — Progress Notes (Signed)
eLink Physician-Brief Progress Note Patient Name: [name] DOB: 1955-07-01 MRN: [id number]   Date of Service  08/27/2018  HPI/Events of Note  Pt failed bedside swallow evaluation today and remains NPO.  eICU Interventions  D 5 % infusion going at 50 ml/ Hr pending Speech Therapy evaluation renewed, Lantus insulin increased to 10 units Q HS for better glycemic control.        Kerry Kass Ogan 08/27/2018, 11:09 PM

## 2018-08-27 NOTE — Progress Notes (Signed)
Patient failed bedside swallow evaluation at this time. Speech consult put in.

## 2018-08-27 NOTE — Progress Notes (Signed)
NAME:  [name], MRN:  [id number], DOB:  [birth year], LOS: 4 ADMISSION DATE:  08/23/2018, CONSULTATION DATE:  08/23/2018 REFERRING MD:  Dr. Sabra Heck, ER, CHIEF COMPLAINT:  AMS/ Respiratory Failure   Brief History   63 yo male smoker presented with altered mental status, hypoxia, hypothermia and hyperglycemia.  Found to have elevated ammonia level, lactic acidosis, hyperkalemia, AKI, Upper GI bleed, aspiration pneumonia.  Past Medical History  DM, Hep C, Substance abuse, Depression, Glaucoma, Cocaine/THC abuse  Significant Hospital Events   5/10 admit 5/12 EGD  Consults:  GI  Procedures:  ETT 5/10 >> Rt IJ CVL 5/10 >>  Significant Diagnostic Tests:  CT head 5/10 >> mild, diffuse atrophy; chronic ischemic white matter disease, severe DJD C6-7 EGD 5/12 >> severe esophagitis, clean based gastric ulcer, duodenal erythema  Micro Data:  COVID 5/10 >> negative Blood 5/10 >> Sputum 5/13 >>  Antimicrobials:  Unasyn 5/10 >>   Interim history/subjective:  On vent.  Objective   Blood pressure (!) 135/54, pulse 74, temperature 99.3 F (37.4 C), resp. rate 20, weight 73 kg, SpO2 99 %.    Vent Mode: CPAP;PSV FiO2 (%):  [30 %-40 %] 30 % Set Rate:  [14 bmp] 14 bmp Vt Set:  [620 mL] 620 mL PEEP:  [5 cmH20] 5 cmH20 Pressure Support:  [8 cmH20] 8 cmH20 Plateau Pressure:  [11 RWE31-54 cmH20] 11 cmH20   Intake/Output Summary (Last 24 hours) at 08/27/2018 0830 Last data filed at 08/27/2018 0086 Gross per 24 hour  Intake 1902.43 ml  Output 2800 ml  Net -897.57 ml   Filed Weights   08/25/18 0353 08/26/18 0436 08/27/18 0302  Weight: 71.7 kg 72.1 kg 73 kg    Examination:  General - on vent Eyes - pupils reactive ENT - ETT in place Cardiac - regular rate/rhythm, no murmur Chest - scattered rhonchi Abdomen - soft, non tender, + bowel sounds Extremities - no cyanosis, clubbing, or edema Skin - no rashes Neuro - follows commands  CXR (reviewed by me) - persistent ASD    Resolved Hospital Problem list   DKA, prerenal azotemia, Hemorrhagic shock from GI bleed, Septic shock from PNA, Elevated ammonia, Hyperkalemia, Lactic acidosis, Hypothermia, Acute metabolic encephalopathy  Assessment & Plan:   Acute respiratory failure with hypoxia from aspiration pneumonia. Tobacco abuse. Plan - extubation trial - oxygen to keep SpO2 > 92% - day 5/7 of ABx  Upper GI bleed from severe esophagitis, clean based gastric ulcer, duodenal erythema Plan - continue protonix BID - f/u EGD pathology results from 5/12  - add sucralfate 1 gm tid when able to swallow  DM type II. Plan - SSI with lantus  Hypernatremia. Hypokalemia, hypophosphatemia. Plan - continue D5W - f/u BMET  Acute blood loss anemia from GI bleed. Plan - f/u CBC - transfuse for Hb < 7 - check iron levels   Best practice:  Diet: NPO DVT prophylaxis: SCDs GI prophylaxis: Protonix Mobility: Consult PT/OT Code Status: Full Disposition:ICU    Labs    CMP Latest Ref Rng & Units 08/27/2018 08/26/2018 08/25/2018  Glucose 70 - 99 mg/dL 225(H) 98 105(H)  BUN 8 - 23 mg/dL 26(H) 40(H) 48(H)  Creatinine 0.61 - 1.24 mg/dL 1.61(H) 2.17(H) 2.15(H)  Sodium 135 - 145 mmol/L 150(H) 150(H) 151(H)  Potassium 3.5 - 5.1 mmol/L 3.4(L) 3.5 3.6  Chloride 98 - 111 mmol/L 115(H) 119(H) 121(H)  CO2 22 - 32 mmol/L 25 24 21(L)  Calcium 8.9 - 10.3 mg/dL 7.9(L) 7.4(L) 7.3(L)  Total  Protein 6.5 - 8.1 g/dL - - -  Total Bilirubin 0.3 - 1.2 mg/dL - - -  Alkaline Phos 38 - 126 U/L - - -  AST 15 - 41 U/L - - -  ALT 0 - 44 U/L - - -   CBC Latest Ref Rng & Units 08/27/2018 08/26/2018 08/25/2018  WBC 4.0 - 10.5 K/uL 13.5(H) 21.4(H) 18.9(H)  Hemoglobin 13.0 - 17.0 g/dL 8.7(L) 8.7(L) 9.5(L)  Hematocrit 39.0 - 52.0 % 26.7(L) 26.5(L) 28.7(L)  Platelets 150 - 400 K/uL 107(L) 112(L) 118(L)   ABG    Component Value Date/Time   PHART 7.475 (H) 08/25/2018 0310   PCO2ART 32.6 08/25/2018 0310   PO2ART 72.9 (L) 08/25/2018 0310    HCO3 23.5 08/25/2018 0310   TCO2 20 (L) 08/24/2018 0212   ACIDBASEDEF 5.0 (H) 08/24/2018 0212   O2SAT 94.8 08/25/2018 0310   CBG (last 3)  Recent Labs    08/26/18 2306 08/27/18 0331 08/27/18 0708  GLUCAP 274* 208* 176*    CC time 33 minutes  Chesley Mires, MD [facility] Pulmonary/Critical Care 08/27/2018, 8:44 AM

## 2018-08-28 ENCOUNTER — Other Ambulatory Visit: Payer: Self-pay

## 2018-08-28 ENCOUNTER — Inpatient Hospital Stay (HOSPITAL_COMMUNITY): Payer: Medicare HMO

## 2018-08-28 DIAGNOSIS — J690 Pneumonitis due to inhalation of food and vomit: Secondary | ICD-10-CM

## 2018-08-28 LAB — CULTURE, BLOOD (ROUTINE X 2)
Culture: NO GROWTH
Special Requests: ADEQUATE

## 2018-08-28 LAB — CBC
HCT: 27.3 % — ABNORMAL LOW (ref 39.0–52.0)
Hemoglobin: 8.9 g/dL — ABNORMAL LOW (ref 13.0–17.0)
MCH: 29.3 pg (ref 26.0–34.0)
MCHC: 32.6 g/dL (ref 30.0–36.0)
MCV: 89.8 fL (ref 80.0–100.0)
Platelets: 148 10*3/uL — ABNORMAL LOW (ref 150–400)
RBC: 3.04 MIL/uL — ABNORMAL LOW (ref 4.22–5.81)
RDW: 15.1 % (ref 11.5–15.5)
WBC: 9.2 10*3/uL (ref 4.0–10.5)
nRBC: 0 % (ref 0.0–0.2)

## 2018-08-28 LAB — GLUCOSE, CAPILLARY
Glucose-Capillary: 199 mg/dL — ABNORMAL HIGH (ref 70–99)
Glucose-Capillary: 182 mg/dL — ABNORMAL HIGH (ref 70–99)
Glucose-Capillary: 292 mg/dL — ABNORMAL HIGH (ref 70–99)
Glucose-Capillary: 329 mg/dL — ABNORMAL HIGH (ref 70–99)
Glucose-Capillary: 289 mg/dL — ABNORMAL HIGH (ref 70–99)

## 2018-08-28 LAB — BASIC METABOLIC PANEL
Anion gap: 8 (ref 5–15)
BUN: 20 mg/dL (ref 8–23)
CO2: 23 mmol/L (ref 22–32)
Calcium: 7.5 mg/dL — ABNORMAL LOW (ref 8.9–10.3)
Chloride: 118 mmol/L — ABNORMAL HIGH (ref 98–111)
Creatinine, Ser: 1.46 mg/dL — ABNORMAL HIGH (ref 0.61–1.24)
GFR calc Af Amer: 59 mL/min — ABNORMAL LOW (ref >60)
GFR calc non Af Amer: 51 mL/min — ABNORMAL LOW (ref >60)
Glucose, Bld: 226 mg/dL — ABNORMAL HIGH (ref 70–99)
Potassium: 3.1 mmol/L — ABNORMAL LOW (ref 3.5–5.1)
Sodium: 149 mmol/L — ABNORMAL HIGH (ref 135–145)

## 2018-08-28 MED ORDER — POTASSIUM CHLORIDE 10 MEQ/50ML IV SOLN
10.0000 meq | INTRAVENOUS | Status: DC
  Administered 2018-08-28 (×2): 10 meq via INTRAVENOUS
  Filled 2018-08-28 (×2): qty 50

## 2018-08-28 MED ORDER — POTASSIUM CHLORIDE 10 MEQ/100ML IV SOLN
10.0000 meq | INTRAVENOUS | Status: AC
  Administered 2018-08-28 (×2): 10 meq via INTRAVENOUS
  Filled 2018-08-28 (×2): qty 100

## 2018-08-28 MED ORDER — FENTANYL CITRATE (PF) 100 MCG/2ML IJ SOLN
25.0000 ug | INTRAMUSCULAR | Status: DC | PRN
  Administered 2018-08-28 – 2018-08-29 (×8): 25 ug via INTRAVENOUS
  Filled 2018-08-28 (×8): qty 2

## 2018-08-28 MED ORDER — CHLORHEXIDINE GLUCONATE CLOTH 2 % EX PADS
6.0000 | MEDICATED_PAD | Freq: Every day | CUTANEOUS | Status: DC
  Administered 2018-08-27 – 2018-09-10 (×9): 6 via TOPICAL

## 2018-08-28 MED ORDER — GABAPENTIN 400 MG PO CAPS
400.0000 mg | ORAL_CAPSULE | Freq: Three times a day (TID) | ORAL | Status: DC
  Administered 2018-08-28 – 2018-09-05 (×23): 400 mg via ORAL
  Filled 2018-08-28 (×23): qty 1

## 2018-08-28 MED ORDER — KETOROLAC TROMETHAMINE 30 MG/ML IJ SOLN
30.0000 mg | Freq: Once | INTRAMUSCULAR | Status: AC
  Administered 2018-08-28: 30 mg via INTRAVENOUS
  Filled 2018-08-28: qty 1

## 2018-08-28 NOTE — Progress Notes (Signed)
MD on call notified patient c/o of no relief from 25 MCG of Fent. IV given new orders received will continue to monitor

## 2018-08-28 NOTE — Progress Notes (Signed)
Felicia,RN made aware of the discontinue CVC order and stated she just removed it.  Therese Sarah

## 2018-08-28 NOTE — Progress Notes (Signed)
Modified Barium Swallow Progress Note  Patient Details  Name: [name] MRN: [id number] Date of Birth: 12/22/1955  Today's Date: 08/28/2018  Modified Barium Swallow completed.  Full report located under Chart Review in the Imaging Section.  Brief recommendations include the following:  Clinical Impression  Pt has a mild-moderate oropharyngeal dysphagia that appears to be near his baseline in comparison to prior MBS studies in 2019. His oral phase is impacted by his poor dentition, with prolonged mastication. He also has decreased bolus cohesion and oral residue that increases with solids. Fluctuating timing of swallow initiation leads to aspiration before the swallow with thin liquids via straw with variable sensation. Chin tuck actually increased the volume aspirated. He had improved airway protection with cup sips and solids. Vallecular residue decreases as boluses become more solid, suspicious for esophageal component particularly in pt with known GER and esophageal issues. Recommend starting Dys 3 diet and thin liquids via cup only. Will f/u briefly for tolerance.   Swallow Evaluation Recommendations       SLP Diet Recommendations: Dysphagia 3 (Mech soft) solids;Thin liquid   Liquid Administration via: Cup;No straw   Medication Administration: Whole meds with puree   Supervision: Patient able to self feed;Intermittent supervision to cue for compensatory strategies   Compensations: Slow rate;Small sips/bites;Clear throat intermittently   Postural Changes: Seated upright at 90 degrees;Remain semi-upright after after feeds/meals (Comment)   Oral Care Recommendations: Oral care BID   Other Recommendations: Have oral suction available    Venita Sheffield Ahmya Bernick 08/28/2018,11:10 AM   Pollyann Glen, M.A. [place] Acute Environmental education officer [phone] Office [phone]

## 2018-08-28 NOTE — Progress Notes (Signed)
[facility] ADULT ICU REPLACEMENT PROTOCOL FOR AM LAB REPLACEMENT ONLY  The patient does not apply for the [facility] Adult ICU Electrolyte Replacment Protocol based on the criteria listed below:    Is urine output >/= 0.5 ml/kg/hr for the last 6 hours? No. Patient's UOP /unknown  Abnormal electrolyte(s):K3.1   If a panic level lab has been reported, has the CCM MD in charge been notified? Yes.  .   Physician: Debbe Mounts 08/28/2018 6:24 AM

## 2018-08-28 NOTE — Progress Notes (Signed)
Physical Therapy Treatment Patient Details Name: [name] MRN: [id number] DOB: [birth year] Today's Date: 08/28/2018    History of Present Illness 63 y.o. male admitted on 08/23/18 for severe diabetic ketoacidosis hypothermia, AMS, hypoxia, elevated amonia, lactic acidosis, hyperkalemia, AKI, UGIB, and aspiration PNA. (+) cocaine and marijuana.  Intubated in the ED on 5/10 extubated 08/27/18.    Pt s/p EGD on 08/25/18.  Pt with other significant PMH of diabetic retinopathy, HTN, DM 1, peripheral vascular balloon angio, R toe amputation.     PT Comments    Patient seen for mobility progression. Patient required total A for hygiene due to BM. Patient requiring Min/Mod A +2 throughout session for all transfers and mobility with RW. Noted poor foot clearance from ground with mobility. Unsteadiness throughout with poor safety awareness increasing his fall risk. PT to continue to follow.    Follow Up Recommendations  SNF     Equipment Recommendations  Rolling walker with 5" wheels    Recommendations for Other Services       Precautions / Restrictions Precautions Precautions: Fall Precaution Comments: last admission note had him using a RW, very weak Restrictions Weight Bearing Restrictions: No    Mobility  Bed Mobility Overal bed mobility: Needs Assistance Bed Mobility: Rolling;Supine to Sit Rolling: Min guard   Supine to sit: Min assist;Mod assist     General bed mobility comments: rolling for peri-care as patient had BM in bed; Min/Mod for trunk control into sitting EOB  Transfers Overall transfer level: Needs assistance Equipment used: Rolling walker (2 wheeled) Transfers: Sit to/from [facility] Sit to Stand: Min assist;Mod assist;+2 physical assistance Stand pivot transfers: Min assist;Mod assist;+2 physical assistance       General transfer comment: Use of RW for UE support; Physical assist to power up from bed and BSC  Ambulation/Gait Ambulation/Gait  assistance: Min assist;Mod assist;+2 physical assistance Gait Distance (Feet): 10 Feet Assistive device: Rolling walker (2 wheeled) Gait Pattern/deviations: Step-to pattern;Decreased stride length;Shuffle Gait velocity: decreased   General Gait Details: required use of RW for support; unsteadiness throughout with cueing for forward progression of LE as patient tends to slide feet across floor.    Stairs             Wheelchair Mobility    Modified Rankin (Stroke Patients Only)       Balance Overall balance assessment: Needs assistance Sitting-balance support: Feet supported;Bilateral upper extremity supported Sitting balance-Leahy Scale: Fair     Standing balance support: Bilateral upper extremity supported;During functional activity Standing balance-Leahy Scale: Poor                              Cognition Arousal/Alertness: Awake/alert Behavior During Therapy: Restless Overall Cognitive Status: Impaired/Different from baseline Area of Impairment: Following commands;Safety/judgement;Problem solving                       Following Commands: Follows one step commands consistently Safety/Judgement: Decreased awareness of safety;Decreased awareness of deficits   Problem Solving: Difficulty sequencing;Requires verbal cues;Requires tactile cues        Exercises      General Comments        Pertinent Vitals/Pain Pain Assessment: Faces Faces Pain Scale: Hurts a little bit Pain Location: reports numbness/pain at B feet - neuropathy Pain Descriptors / Indicators: Burning;Discomfort Pain Intervention(s): Limited activity within patient's tolerance;Monitored during session;Repositioned    Home Living Family/patient expects to be discharged to::  Private residence Living Arrangements: Children                  Prior Function            PT Goals (current goals can now be found in the care plan section) Acute Rehab PT Goals Patient  Stated Goal: to go home PT Goal Formulation: With patient Time For Goal Achievement: 09/10/18 Potential to Achieve Goals: Good Progress towards PT goals: Progressing toward goals    Frequency    Min 3X/week      PT Plan Current plan remains appropriate    Co-evaluation PT/OT/SLP Co-Evaluation/Treatment: Yes Reason for Co-Treatment: Necessary to address cognition/behavior during functional activity;For patient/therapist safety;To address functional/ADL transfers PT goals addressed during session: Mobility/safety with mobility;Balance;Proper use of DME;Strengthening/ROM        AM-PAC PT "6 Clicks" Mobility   Outcome Measure  Help needed turning from your back to your side while in a flat bed without using bedrails?: A Little Help needed moving from lying on your back to sitting on the side of a flat bed without using bedrails?: A Little Help needed moving to and from a bed to a chair (including a wheelchair)?: A Lot Help needed standing up from a chair using your arms (e.g., wheelchair or bedside chair)?: A Lot Help needed to walk in hospital room?: A Lot Help needed climbing 3-5 steps with a railing? : Total 6 Click Score: 13    End of Session Equipment Utilized During Treatment: Gait belt Activity Tolerance: Patient tolerated treatment well;Patient limited by fatigue Patient left: in chair;with call bell/phone within reach;with chair alarm set Nurse Communication: Mobility status PT Visit Diagnosis: Muscle weakness (generalized) (M62.81);Difficulty in walking, not elsewhere classified (R26.2)     Time: 4709-2957 PT Time Calculation (min) (ACUTE ONLY): 29 min  Charges:  $Therapeutic Activity: 8-22 mins                      Lanney Gins, PT, DPT Supplemental Physical Therapist 08/28/18 12:51 PM Pager: [phone] Office: [phone]

## 2018-08-28 NOTE — Plan of Care (Signed)
  Problem: Nutrition: Goal: Adequate nutrition will be maintained Outcome: Progressing   Problem: Activity: Goal: Risk for activity intolerance will decrease Outcome: Progressing   Problem: Coping: Goal: Level of anxiety will decrease Outcome: Progressing   Problem: Elimination: Goal: Will not experience complications related to urinary retention Outcome: Progressing   Problem: Elimination: Goal: Will not experience complications related to bowel motility Outcome: Progressing   Problem: Pain Managment: Goal: General experience of comfort will improve Outcome: Progressing

## 2018-08-28 NOTE — Progress Notes (Signed)
NAME:  [name], MRN:  [id number], DOB:  February 07, 1956, LOS: 5 ADMISSION DATE:  08/23/2018, CONSULTATION DATE:  08/23/2018 REFERRING MD:  Dr. Sabra Heck, ER, CHIEF COMPLAINT:  AMS/ Respiratory Failure   Brief History   62 yo male smoker presented with altered mental status, hypoxia, hypothermia and hyperglycemia.  Found to have elevated ammonia level, lactic acidosis, hyperkalemia, AKI, Upper GI bleed, aspiration pneumonia.  Past Medical History  DM, Hep C, Substance abuse, Depression, Glaucoma, Cocaine/THC abuse  Significant Hospital Events   5/10 admit 5/12 EGD  Consults:  GI  Procedures:  ETT 5/10 >> 5/14 Rt IJ CVL 5/10 >> 5/15  Significant Diagnostic Tests:  CT head 5/10 >> mild, diffuse atrophy; chronic ischemic white matter disease, severe DJD C6-7 EGD 5/12 >> severe esophagitis, clean based gastric ulcer, duodenal erythema  Micro Data:  COVID 5/10 >> negative Blood 5/10 >> Sputum 5/13 >>  Antimicrobials:  Unasyn 5/10 >>   Interim history/subjective:  C/o pain in feet.  Wants something to eat.  Preferably something sweet.  Objective   Blood pressure (!) 144/77, pulse 76, temperature 98 F (36.7 C), temperature source Oral, resp. rate 20, weight 72.4 kg, SpO2 97 %.        Intake/Output Summary (Last 24 hours) at 08/28/2018 0814 Last data filed at 08/28/2018 0600 Gross per 24 hour  Intake 587.21 ml  Output 2250 ml  Net -1662.79 ml   Filed Weights   08/26/18 0436 08/27/18 0302 08/28/18 0437  Weight: 72.1 kg 73 kg 72.4 kg    Examination:  General - alert Eyes - pupils reactive ENT - no sinus tenderness, no stridor, poor dentition Cardiac - regular rate/rhythm, no murmur Chest - equal breath sounds b/l, no wheezing or rales Abdomen - soft, non tender, + bowel sounds Extremities - no cyanosis, clubbing, or edema Skin - no rashes Neuro - normal strength, moves extremities, follows commands   Resolved Hospital Problem list   DKA, prerenal azotemia,  Hemorrhagic shock from GI bleed, Septic shock from PNA, Elevated ammonia, Hyperkalemia, Lactic acidosis, Hypothermia, Acute metabolic encephalopathy, Acute hypoxic respiratory failure  Assessment & Plan:   Aspiration pneumonia. Plan - day 6/7 of ABx  Upper GI bleed from severe esophagitis, clean based gastric ulcer, duodenal erythema Plan - BID protonix - add sucralfate 1 gm tid when able to swallow - f/u EGD pathology from 5/12  DM type II. DM neuropathy. Plan - SSI with lantus - resume neurontin when able to talk pills  Hx of HTN. Plan - hold outpt norvasc for now  Dysphagia. Plan - f/u with speech therapy  Urine retention. Plan - In/Out cath  Hypernatremia. Hypokalemia, hypophosphatemia. Plan - continue D5W - f/u BMET  Acute blood loss anemia from GI bleed. Plan - f/u CBC - transfuse for Hb < 7 - f/u iron levels  Deconditioning. Plan - PT recommending SNF >> will consult social worker   Best practice:  Diet: NPO DVT prophylaxis: SCDs GI prophylaxis: Protonix Mobility: PT/OT Code Status: Full Disposition: transfer to floor bed 5/15 >> to [facility] 5/16 and PCCM off   Labs    CMP Latest Ref Rng & Units 08/28/2018 08/27/2018 08/26/2018  Glucose 70 - 99 mg/dL 226(H) 225(H) 98  BUN 8 - 23 mg/dL 20 26(H) 40(H)  Creatinine 0.61 - 1.24 mg/dL 1.46(H) 1.61(H) 2.17(H)  Sodium 135 - 145 mmol/L 149(H) 150(H) 150(H)  Potassium 3.5 - 5.1 mmol/L 3.1(L) 3.4(L) 3.5  Chloride 98 - 111 mmol/L 118(H) 115(H) 119(H)  CO2  22 - 32 mmol/L 23 25 24   Calcium 8.9 - 10.3 mg/dL 7.5(L) 7.9(L) 7.4(L)  Total Protein 6.5 - 8.1 g/dL - - -  Total Bilirubin 0.3 - 1.2 mg/dL - - -  Alkaline Phos 38 - 126 U/L - - -  AST 15 - 41 U/L - - -  ALT 0 - 44 U/L - - -   CBC Latest Ref Rng & Units 08/28/2018 08/27/2018 08/26/2018  WBC 4.0 - 10.5 K/uL 9.2 13.5(H) 21.4(H)  Hemoglobin 13.0 - 17.0 g/dL 8.9(L) 8.7(L) 8.7(L)  Hematocrit 39.0 - 52.0 % 27.3(L) 26.7(L) 26.5(L)  Platelets 150 - 400 K/uL  148(L) 107(L) 112(L)   ABG    Component Value Date/Time   PHART 7.475 (H) 08/25/2018 0310   PCO2ART 32.6 08/25/2018 0310   PO2ART 72.9 (L) 08/25/2018 0310   HCO3 23.5 08/25/2018 0310   TCO2 20 (L) 08/24/2018 0212   ACIDBASEDEF 5.0 (H) 08/24/2018 0212   O2SAT 94.8 08/25/2018 0310   CBG (last 3)  Recent Labs    08/27/18 2334 08/28/18 0331 08/28/18 [place], MD [place] 08/28/2018, 8:14 AM

## 2018-08-28 NOTE — Consult Note (Signed)
   [facility] CM Inpatient Consult   08/28/2018  [name] Jul 27, 1955 [id number]    Patient evaluated for extreme high risk for unpalnned readmissions and to check for eligibility for  [facility] Care Management services.  Patient is not currently a beneficiary of the attributed [place] in the [employer].  Patient has no primary care provider. [employer] This patient is currently Not eligible for [facility] Care Management Services.   Reason:  Patient does not have a primary care provider in the [employer].  [name], RN BSN [facility] Liaison  [phone] business mobile phone Toll free office [phone]  Fax number: [phone] [email or web address]

## 2018-08-28 NOTE — Progress Notes (Signed)
eLink Physician-Brief Progress Note Patient Name: [name] DOB: Apr 02, 1956 MRN: [id number]   Date of Service  08/28/2018  HPI/Events of Note  K+ 3.1  eICU Interventions  KCL 10 meq iv Q 1 hour x 4        [place] U Ogan 08/28/2018, 6:31 AM
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
## 2018-08-28 NOTE — Progress Notes (Signed)
Nutrition Follow-up RD working remotely.  DOCUMENTATION CODES:   Not applicable  INTERVENTION:   Diet advancement as able s/p SLP evaluation.  If unable to safely advance PO diet, recommend placing small bore feeding tube for TF--Cortrak (available Mondays and Fridays) vs Fluoroscopy.  Glucerna 1.2 at 75 ml/h would provide 2160 kcal, 108 gm protein, 1449 ml free water daily.  NUTRITION DIAGNOSIS:   Inadequate oral intake related to inability to eat as evidenced by NPO status.  Ongoing  GOAL:   Patient will meet greater than or equal to 90% of their needs  Unmet  MONITOR:   Diet advancement, PO intake  ASSESSMENT:   63 yo male with PMH of type 1 DM, hepatitis C, drug abuse, diabetic retinopathy, R great toe amputation, HTN, vitiligo. Admitted with hyperglycemia (glucose 2500), DKA, hypothermia, and gastritis. Tox screens positive for cocaine and marijuana.   Patient was extubated 5/14. Remains NPO; awaiting SLP evaluation for diet advancement.  Labs reviewed. Sodium 149 (H), potassium 3.1 (L), creatinine 1.46 (H) CBG's: 199-182  Medications reviewed and include Novolog, Lantus, KCl. IVF: D5 at 50 ml/h   I/O +8 L since admission; weight up 4.5 kg  Diet Order:   Diet Order            Diet NPO time specified Except for: Ice Chips  Diet effective now              EDUCATION NEEDS:   No education needs have been identified at this time  Skin:  Skin Assessment: Reviewed RN Assessment  Last BM:  5/15 (type 6)  Height:   Ht Readings from Last 1 Encounters:  08/12/18 6' (1.829 m)    Weight:   Wt Readings from Last 1 Encounters:  08/28/18 72.4 kg    Ideal Body Weight:  80.9 kg  BMI:  Body mass index is 21.65 kg/m.  Estimated Nutritional Needs:   Kcal:  2050-2250  Protein:  90-110 gm  Fluid:  2.2 L    Molli Barrows, RD, LDN, [place] Pager [phone] After Hours Pager [phone]
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
## 2018-08-28 NOTE — Evaluation (Signed)
Clinical/Bedside Swallow Evaluation Patient Details  Name: [name] MRN: [id number] Date of Birth: 09/10/1955  Today's Date: 08/28/2018 Time: SLP Start Time (ACUTE ONLY): 0858 SLP Stop Time (ACUTE ONLY): 9983 SLP Time Calculation (min) (ACUTE ONLY): 24 min  Past Medical History:  Past Medical History:  Diagnosis Date  . DEPRESSION   . DIABETES MELLITUS, TYPE I   . DRUG ABUSE    pt should have NO controlled substances rx'ed  . Glaucoma   . HEPATITIS C    chronic  . Hypertension 02/18/2011  . 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 Vitiligo    Past Surgical History:  Past Surgical History:  Procedure Laterality Date  . ABDOMINAL AORTOGRAM W/LOWER EXTREMITY N/A 07/25/2016   Procedure: Abdominal Aortogram w/Bilateral Lower Extremity Runoff;  Surgeon: Conrad [place], MD;  Location: [place] [facility];  Service: Cardiovascular;  Laterality: N/A;  . ABDOMINAL AORTOGRAM W/LOWER EXTREMITY N/A 12/24/2016   Procedure: ABDOMINAL AORTOGRAM W/LOWER EXTREMITY;  Surgeon: Serafina Mitchell, MD;  Location: [place] [facility];  Service: Cardiovascular;  Laterality: N/A;  . AMPUTATION TOE Right 07/26/2016   Procedure: AMPUTATION GREAT TOE;  Surgeon: Serafina Mitchell, MD;  Location: [place];  Service: Vascular;  Laterality: Right;  . BIOPSY  08/25/2018   Procedure: BIOPSY;  Surgeon: Irving Copas., MD;  Location: [place];  Service: Gastroenterology;;  . ESOPHAGOGASTRODUODENOSCOPY N/A 08/25/2018   Procedure: ESOPHAGOGASTRODUODENOSCOPY (EGD);  Surgeon: Irving Copas., MD;  Location: [place];  Service: Gastroenterology;  Laterality: N/A;  . EYE SURGERY     retinal surgery x 2, right eye  . INSERTION OF ILIAC STENT Right 07/26/2016   Procedure: INSERTION OF RIGHT POPLITEAL STENT WITH BALLOON ANGIOPLASTY;  Surgeon: Serafina Mitchell, MD;  Location: [place];  Service: Vascular;  Laterality: Right;  . LOWER EXTREMITY ANGIOGRAM Right 07/26/2016   Procedure: LOWER EXTREMITY ANGIOGRAM;   Surgeon: Serafina Mitchell, MD;  Location: [place];  Service: Vascular;  Laterality: Right;  . PERIPHERAL VASCULAR BALLOON ANGIOPLASTY Right 07/26/2016   Procedure: PERIPHERAL VASCULAR BALLOON ANGIOPLASTY  RIGHT ANTERIOR TIBEAL ARTERY AND RIGHT SUPERFICIAL FEMORAL ARTERY;  Surgeon: Serafina Mitchell, MD;  Location: [place];  Service: Vascular;  Laterality: Right;  . PERIPHERAL VASCULAR INTERVENTION Right 12/24/2016   Procedure: PERIPHERAL VASCULAR INTERVENTION;  Surgeon: Serafina Mitchell, MD;  Location: [place] [facility];  Service: Cardiovascular;  Laterality: Right;  lower extr   HPI:  63 y.o. male admitted on 08/23/18 for severe diabetic ketoacidosis hypothermia, AMS, hypoxia, elevated amonia, lactic acidosis, hyperkalemia, AKI, UGIB, and aspiration PNA. (+) cocaine and marijuana.  Intubated in the ED on 5/10 extubated 08/27/18.    Pt s/p EGD on 5/12 showing severe esophagitis.  Pt with other significant PMH of diabetic retinopathy, HTN, DM 1, peripheral vascular balloon angio, R toe amputation. MBS in 2019 showed intermittent aspiration of thin liquids with variable sensation.   Assessment / Plan / Recommendation Clinical Impression  Pt has a h/o mild dysphagia documented in prior MBS studies, although recommended to be on regular solids and thin liquids. He has consistent coughing today with thin liquids and ice chips, which may be near his baseline, although there is certainly the potential for an acute exacerbation in light of his intubation. Recommend proceeding with MBS prior to initiating diet. SLP Visit Diagnosis: Dysphagia, unspecified (R13.10)    Aspiration Risk  Mild aspiration risk;Moderate aspiration risk    Diet Recommendation NPO except meds;Ice chips PRN after oral care   Medication  Administration: Whole meds with puree    Other  Recommendations Oral Care Recommendations: Oral care QID Other Recommendations: Have oral suction available   Follow up Recommendations (tba)       Frequency and Duration            Prognosis Prognosis for Safe Diet Advancement: Good      Swallow Study   General HPI: 63 y.o. male admitted on 08/23/18 for severe diabetic ketoacidosis hypothermia, AMS, hypoxia, elevated amonia, lactic acidosis, hyperkalemia, AKI, UGIB, and aspiration PNA. (+) cocaine and marijuana.  Intubated in the ED on 5/10 extubated 08/27/18.    Pt s/p EGD on 5/12 showing severe esophagitis.  Pt with other significant PMH of diabetic retinopathy, HTN, DM 1, peripheral vascular balloon angio, R toe amputation. MBS in 2019 showed intermittent aspiration of thin liquids with variable sensation. Type of Study: Bedside Swallow Evaluation Previous Swallow Assessment: see HPI Diet Prior to this Study: NPO Temperature Spikes Noted: No Respiratory Status: Nasal cannula History of Recent Intubation: Yes Length of Intubations (days): 5 days Date extubated: 08/27/18 Behavior/Cognition: Alert;Cooperative;Pleasant mood Oral Cavity Assessment: Dry Oral Care Completed by SLP: No Oral Cavity - Dentition: Poor condition Vision: Functional for self-feeding Self-Feeding Abilities: Able to feed self Patient Positioning: Upright in chair Baseline Vocal Quality: (mildly rough) Volitional Swallow: Able to elicit    Oral/Motor/Sensory Function Overall Oral Motor/Sensory Function: Within functional limits   Ice Chips Ice chips: Impaired Presentation: Spoon Pharyngeal Phase Impairments: Cough - Immediate;Cough - Delayed   Thin Liquid Thin Liquid: Impaired Presentation: Cup;Self Fed;Spoon Pharyngeal  Phase Impairments: Cough - Delayed;Cough - Immediate    Nectar Thick Nectar Thick Liquid: Not tested   Honey Thick Honey Thick Liquid: Not tested   Puree Puree: Within functional limits Presentation: Spoon   Solid     Solid: Not tested      Venita Sheffield Romir Klimowicz 08/28/2018,9:35 AM  Pollyann Glen, M.A. [place] Acute Environmental education officer [phone] Office  [phone]

## 2018-08-29 DIAGNOSIS — N179 Acute kidney failure, unspecified: Secondary | ICD-10-CM

## 2018-08-29 DIAGNOSIS — E1011 Type 1 diabetes mellitus with ketoacidosis with coma: Secondary | ICD-10-CM

## 2018-08-29 LAB — CBC
HCT: 26 % — ABNORMAL LOW (ref 39.0–52.0)
Hemoglobin: 8.5 g/dL — ABNORMAL LOW (ref 13.0–17.0)
MCH: 28.6 pg (ref 26.0–34.0)
MCHC: 32.7 g/dL (ref 30.0–36.0)
MCV: 87.5 fL (ref 80.0–100.0)
Platelets: 191 10*3/uL (ref 150–400)
RBC: 2.97 MIL/uL — ABNORMAL LOW (ref 4.22–5.81)
RDW: 14.7 % (ref 11.5–15.5)
WBC: 8.9 10*3/uL (ref 4.0–10.5)
nRBC: 0 % (ref 0.0–0.2)

## 2018-08-29 LAB — BASIC METABOLIC PANEL
Anion gap: 6 (ref 5–15)
BUN: 22 mg/dL (ref 8–23)
CO2: 22 mmol/L (ref 22–32)
Calcium: 7.5 mg/dL — ABNORMAL LOW (ref 8.9–10.3)
Chloride: 113 mmol/L — ABNORMAL HIGH (ref 98–111)
Creatinine, Ser: 1.36 mg/dL — ABNORMAL HIGH (ref 0.61–1.24)
GFR calc Af Amer: >60 mL/min (ref >60)
GFR calc non Af Amer: 56 mL/min — ABNORMAL LOW (ref >60)
Glucose, Bld: 298 mg/dL — ABNORMAL HIGH (ref 70–99)
Potassium: 3.1 mmol/L — ABNORMAL LOW (ref 3.5–5.1)
Sodium: 141 mmol/L (ref 135–145)

## 2018-08-29 LAB — CULTURE, BLOOD (ROUTINE X 2)
Culture: NO GROWTH
Special Requests: ADEQUATE

## 2018-08-29 LAB — FERRITIN: Ferritin: 190 ng/mL (ref 24–336)

## 2018-08-29 LAB — CULTURE, RESPIRATORY W GRAM STAIN: Special Requests: NORMAL

## 2018-08-29 LAB — GLUCOSE, CAPILLARY
Glucose-Capillary: 260 mg/dL — ABNORMAL HIGH (ref 70–99)
Glucose-Capillary: 286 mg/dL — ABNORMAL HIGH (ref 70–99)
Glucose-Capillary: 337 mg/dL — ABNORMAL HIGH (ref 70–99)
Glucose-Capillary: 350 mg/dL — ABNORMAL HIGH (ref 70–99)
Glucose-Capillary: 352 mg/dL — ABNORMAL HIGH (ref 70–99)
Glucose-Capillary: 352 mg/dL — ABNORMAL HIGH (ref 70–99)

## 2018-08-29 LAB — IRON AND TIBC
Iron: 29 ug/dL — ABNORMAL LOW (ref 45–182)
Saturation Ratios: 17 % — ABNORMAL LOW (ref 17.9–39.5)
TIBC: 167 ug/dL — ABNORMAL LOW (ref 250–450)
UIBC: 138 ug/dL

## 2018-08-29 MED ORDER — ACETAMINOPHEN 325 MG PO TABS
650.0000 mg | ORAL_TABLET | Freq: Four times a day (QID) | ORAL | Status: DC | PRN
  Administered 2018-09-01 – 2018-09-07 (×3): 650 mg via ORAL
  Filled 2018-08-29 (×4): qty 2

## 2018-08-29 MED ORDER — INSULIN GLARGINE 100 UNIT/ML ~~LOC~~ SOLN
15.0000 [IU] | Freq: Every day | SUBCUTANEOUS | Status: DC
  Administered 2018-08-29: 15 [IU] via SUBCUTANEOUS
  Filled 2018-08-29 (×2): qty 0.15

## 2018-08-29 MED ORDER — IPRATROPIUM-ALBUTEROL 0.5-2.5 (3) MG/3ML IN SOLN: 3.0000 mL | Freq: Four times a day (QID) | RESPIRATORY_TRACT | Status: DC | PRN

## 2018-08-29 MED ORDER — OXYCODONE HCL 5 MG PO TABS
5.0000 mg | ORAL_TABLET | ORAL | Status: DC | PRN
  Administered 2018-08-29 (×3): 5 mg via ORAL
  Filled 2018-08-29 (×3): qty 1

## 2018-08-29 MED ORDER — INSULIN ASPART 100 UNIT/ML ~~LOC~~ SOLN
1.0000 [IU] | Freq: Three times a day (TID) | SUBCUTANEOUS | Status: DC
  Administered 2018-08-29: 4 [IU] via SUBCUTANEOUS
  Administered 2018-08-29: 5 [IU] via SUBCUTANEOUS
  Administered 2018-08-30 (×2): 2 [IU] via SUBCUTANEOUS
  Administered 2018-08-30: 1 [IU] via SUBCUTANEOUS
  Administered 2018-08-30: 2 [IU] via SUBCUTANEOUS
  Administered 2018-08-31 (×2): 1 [IU] via SUBCUTANEOUS
  Administered 2018-08-31: 2 [IU] via SUBCUTANEOUS
  Administered 2018-09-01 (×2): 1 [IU] via SUBCUTANEOUS
  Administered 2018-09-02: 2 [IU] via SUBCUTANEOUS
  Administered 2018-09-03: 3 [IU] via SUBCUTANEOUS
  Administered 2018-09-03: 2 [IU] via SUBCUTANEOUS
  Administered 2018-09-04: 4 [IU] via SUBCUTANEOUS
  Administered 2018-09-04: 3 [IU] via SUBCUTANEOUS
  Administered 2018-09-04 (×2): 2 [IU] via SUBCUTANEOUS
  Administered 2018-09-05: 23:00:00 4 [IU] via SUBCUTANEOUS
  Administered 2018-09-05: 1 [IU] via SUBCUTANEOUS
  Administered 2018-09-05: 2 [IU] via SUBCUTANEOUS
  Administered 2018-09-06: 3 [IU] via SUBCUTANEOUS
  Administered 2018-09-06: 4 [IU] via SUBCUTANEOUS
  Administered 2018-09-06: 08:00:00 3 [IU] via SUBCUTANEOUS
  Administered 2018-09-07: 1 [IU] via SUBCUTANEOUS
  Administered 2018-09-07: 22:00:00 2 [IU] via SUBCUTANEOUS
  Administered 2018-09-08: 5 [IU] via SUBCUTANEOUS
  Administered 2018-09-09 – 2018-09-10 (×3): 1 [IU] via SUBCUTANEOUS

## 2018-08-29 MED ORDER — SUCRALFATE 1 GM/10ML PO SUSP
1.0000 g | Freq: Three times a day (TID) | ORAL | Status: DC
  Administered 2018-08-29 – 2018-08-31 (×9): 1 g via ORAL
  Filled 2018-08-29 (×10): qty 10

## 2018-08-29 MED ORDER — POTASSIUM CHLORIDE 20 MEQ/15ML (10%) PO SOLN
40.0000 meq | Freq: Once | ORAL | Status: AC
  Administered 2018-08-29: 09:00:00 40 meq via ORAL
  Filled 2018-08-29: qty 30

## 2018-08-29 MED ORDER — ONDANSETRON HCL 4 MG/2ML IJ SOLN
4.0000 mg | Freq: Four times a day (QID) | INTRAMUSCULAR | Status: DC | PRN
  Administered 2018-08-29 – 2018-09-02 (×2): 4 mg via INTRAVENOUS
  Filled 2018-08-29 (×2): qty 2

## 2018-08-29 NOTE — Progress Notes (Signed)
Patient stated that he is need of Glucometer and that he doesn't check his CBG before administration of insulin.Placed order for Social Worker consult to assist with attaining one. Arthor Captain LPN

## 2018-08-29 NOTE — TOC Initial Note (Signed)
Transition of Care [facility]) - Initial/Assessment Note    Patient Details  Name: [name] MRN: [id number] Date of Birth: [birth year]  Transition of Care [facility]) CM/SW Contact:    Oretha Milch, LCSW Phone Number: 08/29/2018, 4:50 PM  Clinical Narrative:  CSW met with patient to assess for community and family supports. CSW noted patient wanted to return home but is open and understanding to having to go to build his physical strength to return home. CSW noted patient is open for referral to [facility] but prefers [facility]. CSW noted patient was open and appropriate during assessment.             Expected Discharge Plan: Skilled Nursing Facility Barriers to Discharge: Insurance Authorization   Patient Goals and CMS Choice Patient states their goals for this hospitalization and ongoing recovery are:: "I want to go back home with my sons. But I know I need to go to rehab." CMS Medicare.gov Compare Post Acute Care list provided to:: Patient Choice offered to / list presented to : Patient  Expected Discharge Plan and Services Expected Discharge Plan: [place]       Living arrangements for the past 2 months: Single Family Home Expected Discharge Date: 08/31/18                                    Prior Living Arrangements/Services Living arrangements for the past 2 months: Single Family Home Lives with:: Adult Children Patient language and need for interpreter reviewed:: No Do you feel safe going back to the place where you live?: Yes      Need for Family Participation in Patient Care: No (Comment) Care giver support system in place?: Yes (comment)(Patient reports his sons provide him care giving support at home.) Current home services: DME Criminal Activity/Legal Involvement Pertinent to Current Situation/Hospitalization: No - Comment as needed  Activities of Daily Living Home Assistive Devices/Equipment: None ADL Screening (condition at time of  admission) Patient's cognitive ability adequate to safely complete daily activities?: Yes Is the patient deaf or have difficulty hearing?: No Does the patient have difficulty seeing, even when wearing glasses/contacts?: No Does the patient have difficulty concentrating, remembering, or making decisions?: No Patient able to express need for assistance with ADLs?: Yes Does the patient have difficulty dressing or bathing?: No Independently performs ADLs?: Yes (appropriate for developmental age) Does the patient have difficulty walking or climbing stairs?: No Weakness of Legs: None Weakness of Arms/Hands: None  Permission Sought/Granted Permission sought to share information with : Facility Art therapist granted to share information with : Yes, Verbal Permission Granted  Share Information with NAME: Facilities for referral purposes  Permission granted to share info w AGENCY: SNFs        Emotional Assessment Appearance:: Appears older than stated age Attitude/Demeanor/Rapport: Engaged, Self-Confident Affect (typically observed): Accepting, Adaptable, Appropriate Orientation: : Oriented to Self, Oriented to Place, Oriented to Situation, Oriented to  Time Alcohol / Substance Use: Not Applicable Psych Involvement: No (comment)  Admission diagnosis:  AKI (acute kidney injury) ([place]) [N17.9] Hypothermia, initial encounter [T68.XXXA] Hypotension, unspecified hypotension type [I95.9] Altered mental status, unspecified altered mental status type [R41.82] Gastrointestinal hemorrhage, unspecified gastrointestinal hemorrhage type [K92.2] Diabetic ketoacidosis with coma associated with type 1 diabetes mellitus ([place]) [E10.11] Acute liver failure without hepatic coma [K72.00] DKA (diabetic ketoacidoses) ([place]) [E11.10] Patient Active Problem List   Diagnosis Date Noted  . Hypoglycemia associated  with diabetes ([place]) 08/12/2018  . At risk for adverse drug event 02/17/2018  .  Abrasions of multiple sites   . Demand ischemia of myocardium ([place]) 02/05/2018  . Acute metabolic encephalopathy 30/10/6224  . DKA (diabetic ketoacidoses) ([place]) 02/03/2018  . Leukocytosis 02/03/2018  . Hypoglycemia 12/24/2017  . Syncope 12/24/2017  . Syncope and collapse 12/23/2017  . IV drug abuse ([place]) 04/19/2017  . Acute encephalopathy 04/19/2017  . Hemangioma 10/02/2016  . Foreign body (FB) in soft tissue   . 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 Gangrene ([place])   . Type I (juvenile type) diabetes mellitus without mention of complication, not stated as uncontrolled   . Tobacco abuse   . Essential hypertension   . Diabetic infection of right foot ([place]) 07/22/2016  . Diabetic foot infection ([place]) 07/22/2016  . Chronic ulcer of heel, right, with fat layer exposed ([place]) 05/20/2016  . DKA, type 1 ([place]) 04/18/2016  . Acute kidney injury ([place]) 04/18/2016  . Hyponatremia 04/18/2016  . Severe protein-calorie malnutrition ([place]) 04/18/2016  . Elevated troponin 04/18/2016  . Poor dentition 07/12/2015  . Tobacco use 07/06/2013  . PAD (peripheral artery disease) ([place]) 04/29/2013  . COPD, mild ([place])   . 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 Diabetic neuropathy ([place]) 06/21/2010  . PROLIFERATIVE DIABETIC RETINOPATHY 06/21/2010  . SKIN TAG 01/30/2010  . Chronic hepatitis C ([place]) 11/22/2008  . VITILIGO 11/22/2008  . PROTEINURIA, MILD 11/22/2008  . Substance abuse ([place]) 04/23/2007  . DEPRESSION 11/11/2006   PCP:  Patient, No Pcp Per Pharmacy:   [facility] [address], [place] - [place] [address] [place] [place] [address] Phone: [phone] Fax: [phone]     Social Determinants of Health (SDOH) Interventions    Readmission Risk Interventions Readmission Risk Prevention Plan 08/12/2018  Transportation Screening Complete  Medication Review Press photographer) Complete  PCP or Specialist  appointment within 3-5 days of discharge Complete  HRI or [place] Complete  SW Recovery Care/Counseling Consult Complete  [place] Not Applicable  Some recent data might be hidden

## 2018-08-29 NOTE — Progress Notes (Signed)
PROGRESS NOTE        PATIENT DETAILS Name: [name] Age: 63 y.o. Sex: male Date of Birth: [birth year] Admit Date: 08/23/2018 Admitting Physician Brand Males, MD [id number], No Pcp Per  Brief Narrative: Patient is a 63 y.o. male with past medical history of IDDM, HTN, cocaine/marijuana use to the hospital with acute metabolic encephalopathy in the setting of DKA, AKI.  Furthermore patient was found to have septic shock from PNA, and subsequently developed acute blood loss anemia in the setting of GI bleeding.  Patient was admitted to the ICU by PCCM-patient was intubated on admission-patient was given IV antibiotics, underwent EGD on 5/12-subsequently was extubated on 5/14.  Patient was transferred to [facility] on 5/16.  See below for further details  Subjective: Lying comfortably in bed-continues to have heartburn and some swallowing issues but feels overall much better than the past few days  Assessment/Plan: Acute metabolic encephalopathy: Resolved-patient is completely awake and alert.  Secondary to DKA, AKI, PNA along with respiratory failure.    Acute hypoxic respiratory failure secondary to aspiration pneumonia: Intubated on admission-extubated on 5/14. Has been titrated down to room air.  Continue Unasyn-stop date of 5/16  Upper GI bleeding with acute blood loss anemia: Underwent EGD on 5/12 which showed severe esophagitis-nonbleeding gastric ulcers-continue PPI and Carafate.  Able at 8.5.  Has not required PRBC transfusion during this hospital stay.  EGD biopsy negative for malignancy  HTN: Blood pressure stable-all antihypertensives on hold  IDDM: CBG slightly on the higher side-increase Lantus to 15 units, continue SSI.    Hypokalemia: Replete and recheck.  AKI: Likely hemodynamically mediated-improved  Hypernatremia: Resolved-follow electrolytes periodically  Peripheral neuropathy: Continue with Neurontin  Cocaine/marijuana use:  Counseled  Deconditioning/debility: Secondary to acute/critical illness-probably will require SNF on discharge.  Dysphagia: Secondary to acute illness/debility and esophagitis.  Tolerating dysphagia 3 diet.  SLP following  COVID 19 screen: Negative  DVT Prophylaxis: SCD's given GI bleeding  Code Status: Full code   Family Communication: None at bedside  Disposition Plan: Remain inpatient-probably SNF on discharge  Antimicrobial agents: Anti-infectives (From admission, onward)   Start     Dose/Rate Route Frequency Ordered Stop   08/27/18 1200  Ampicillin-Sulbactam (UNASYN) 3 g in sodium chloride 0.9 % 100 mL IVPB     3 g 200 mL/hr over 30 Minutes Intravenous Every 6 hours 08/27/18 1033 08/29/18 2359   08/27/18 1152  Ampicillin-Sulbactam (UNASYN) 3 g in sodium chloride 0.9 % 100 mL IVPB  Status:  Discontinued     3 g 200 mL/hr over 30 Minutes Intravenous Every 6 hours 08/27/18 1032 08/27/18 1033   08/26/18 0600  Ampicillin-Sulbactam (UNASYN) 3 g in sodium chloride 0.9 % 100 mL IVPB  Status:  Discontinued     3 g 200 mL/hr over 30 Minutes Intravenous Every 8 hours 08/26/18 0052 08/27/18 1032   08/23/18 1700  Ampicillin-Sulbactam (UNASYN) 3 g in sodium chloride 0.9 % 100 mL IVPB  Status:  Discontinued     3 g 200 mL/hr over 30 Minutes Intravenous Every 12 hours 08/23/18 1657 08/26/18 0052      Procedures: 5/12 EGD ETT 5/10 >> 5/14 Rt IJ CVL 5/10 >> 5/15  CONSULTS:  pulmonary/intensive care and GI  Time spent: 25 minutes-Greater than 50% of this time was spent in counseling, explanation of diagnosis, planning of further management, and coordination  of care.  MEDICATIONS: Scheduled Meds:  Chlorhexidine Gluconate Cloth  6 each Topical Q0600   gabapentin  400 mg Oral TID   insulin aspart  1-5 Units Subcutaneous Q4H   insulin glargine  10 Units Subcutaneous QHS   pantoprazole  40 mg Intravenous Q12H   sucralfate  1 g Oral TID WC & HS   Continuous Infusions:   ampicillin-sulbactam (UNASYN) IV 3 g (08/29/18 0509)   PRN Meds:.fentaNYL (SUBLIMAZE) injection   PHYSICAL EXAM: Vital signs: Vitals:   08/28/18 2040 08/29/18 0001 08/29/18 0415 08/29/18 0806  BP: (!) 147/61 (!) 133/51 (!) 153/57 128/61  Pulse: 78 71 70 73  Resp:    (!) 22  Temp: 98.3 F (36.8 C) 98.1 F (36.7 C) (!) 97.5 F (36.4 C) 98.5 F (36.9 C)  TempSrc: Oral Oral Oral Oral  SpO2: 93% 93% 93% 92%  Weight:      Height:       Filed Weights   08/27/18 0302 08/28/18 0437 08/28/18 1128  Weight: 73 kg 72.4 kg 72 kg   Body mass index is 21.53 kg/m.   General appearance :Awake, alert, not in any distress.  HEENT: Atraumatic and Normocephalic Neck: supple, no JVD. No cervical lymphadenopathy. No thyromegaly Resp:Good air entry bilaterally, no added sounds heard anteriorly CVS: S1 S2 regular, no murmurs.  GI: Bowel sounds present, Non tender and not distended with no gaurding, rigidity or rebound.No organomegaly Extremities: B/L Lower Ext shows no edema, both legs are warm to touch Neurology: Nonfocal but with generalized weakness Psychiatric: Normal judgment and insight. Alert and oriented x 3. Normal mood. Musculoskeletal:No digital cyanosis Skin:No Rash, warm and dry Wounds:N/A  I have personally reviewed following labs and imaging studies  LABORATORY DATA: CBC: Recent Labs  Lab 08/23/18 1100  08/23/18 1413  08/25/18 1822 08/26/18 0348 08/27/18 0430 08/28/18 0443 08/29/18 0307  WBC 27.2*  --  21.7*   < > 18.9* 21.4* 13.5* 9.2 8.9  NEUTROABS 22.0*  --  19.2*  --   --   --  10.9*  --   --   HGB 11.3*   < > 10.4*   < > 9.5* 8.7* 8.7* 8.9* 8.5*  HCT 41.4   < > 34.7*   < > 28.7* 26.5* 26.7* 27.3* 26.0*  MCV 107.0*  --  97.7   < > 87.8 88.9 90.2 89.8 87.5  PLT 452*  --  363   < > 118* 112* 107* 148* 191   < > = values in this interval not displayed.    Basic Metabolic Panel: Recent Labs  Lab 08/23/18 1100  08/24/18 0406  08/25/18 0329 08/25/18 1822  08/26/18 0348 08/27/18 0430 08/28/18 0443 08/29/18 0307  NA 134*   < > 150*   < > 151* 151* 150* 150* 149* 141  K 6.4*   < > 3.4*   < > 3.4* 3.6 3.5 3.4* 3.1* 3.1*  CL 76*   < > 118*   < > 122* 121* 119* 115* 118* 113*  CO2 12*   < > 19*   < > 21* 21* 24 25 23 22   GLUCOSE >2,500*   < > 136*   < > 185* 105* 98 225* 226* 298*  BUN 107*   < > 92*   < > 61* 48* 40* 26* 20 22  CREATININE 5.36*   < > 3.85*   < > 2.85* 2.15* 2.17* 1.61* 1.46* 1.36*  CALCIUM 8.6*   < > 6.8*   < >  6.9* 7.3* 7.4* 7.9* 7.5* 7.5*  MG 4.1*  --  2.2  --  1.9  --  1.8 2.2  --   --   PHOS  --   --  3.4  --  2.9  --  2.9 2.3*  --   --    < > = values in this interval not displayed.    GFR: Estimated Creatinine Clearance: 58.1 mL/min (A) (by C-G formula based on SCr of 1.36 mg/dL (H)).  Liver Function Tests: Recent Labs  Lab 08/23/18 1100 08/23/18 1413 08/25/18 1111  AST 22 26 37  ALT 26 24 26   ALKPHOS 146* 133* 89  BILITOT 1.3* 1.5* 0.5  PROT 6.2* 5.3* 4.6*  ALBUMIN 2.9* 2.5* 1.7*   Recent Labs  Lab 08/23/18 1100  LIPASE 17   Recent Labs  Lab 08/23/18 1055 08/25/18 1111 08/26/18 0348  AMMONIA 155* 21 13    Coagulation Profile: Recent Labs  Lab 08/23/18 1100 08/26/18 0348  INR 1.0 1.1    Cardiac Enzymes: Recent Labs  Lab 08/23/18 1100 08/23/18 1839 08/23/18 2223 08/24/18 0406 08/25/18 1111 08/26/18 0348  CKTOTAL 181  --   --   --   --   --   TROPONINI 0.06* 0.31* 0.61* 0.94* 0.38* 0.22*    BNP (last 3 results) No results for input(s): PROBNP in the last 8760 hours.  HbA1C: No results for input(s): HGBA1C in the last 72 hours.  CBG: Recent Labs  Lab 08/28/18 1622 08/28/18 2006 08/29/18 0002 08/29/18 0413 08/29/18 0758  GLUCAP 329* 289* 352* 286* 260*    Lipid Profile: No results for input(s): CHOL, HDL, LDLCALC, TRIG, CHOLHDL, LDLDIRECT in the last 72 hours.  Thyroid Function Tests: No results for input(s): TSH, T4TOTAL, FREET4, T3FREE, THYROIDAB in the last 72  hours.  Anemia Panel: Recent Labs    08/29/18 0307  FERRITIN 190  TIBC 167*  IRON 29*    Urine analysis:    Component Value Date/Time   COLORURINE YELLOW (A) 08/23/2018 1055   APPEARANCEUR CLEAR (A) 08/23/2018 1055   LABSPEC 1.010 08/23/2018 1055   PHURINE 5.0 08/23/2018 1055   GLUCOSEU >1,000 (A) 08/23/2018 1055   GLUCOSEU 500 04/28/2012 1149   HGBUR NEGATIVE 08/23/2018 Luis Lopez 08/23/2018 1055   BILIRUBINUR neg 04/28/2012 1053   KETONESUR 15 (A) 08/23/2018 1055   PROTEINUR 30 (A) 08/23/2018 1055   UROBILINOGEN 4.0 (H) 11/16/2013 1646   NITRITE NEGATIVE 08/23/2018 1055   LEUKOCYTESUR NEGATIVE 08/23/2018 1055    Sepsis Labs: Lactic Acid, Venous    Component Value Date/Time   LATICACIDVEN 2.8 (HH) 08/24/2018 0406    MICROBIOLOGY: Recent Results (from the past 240 hour(s))  SARS Coronavirus 2 [facility] order, Performed in [place] hospital lab)     Status: None   Collection Time: 08/23/18 10:55 AM  Result Value Ref Range Status   SARS Coronavirus 2 NEGATIVE NEGATIVE Final    Comment: (NOTE) If result is NEGATIVE SARS-CoV-2 target nucleic acids are NOT DETECTED. The SARS-CoV-2 RNA is generally detectable in upper and lower  respiratory specimens during the acute phase of infection. The lowest  concentration of SARS-CoV-2 viral copies this assay can detect is 250  copies / mL. A negative result does not preclude SARS-CoV-2 infection  and should not be used as the sole basis for treatment or other  patient management decisions.  A negative result may occur with  improper specimen collection / handling, submission of specimen other  than nasopharyngeal  swab, presence of viral mutation(s) within the  areas targeted by this assay, and inadequate number of viral copies  (<250 copies / mL). A negative result must be combined with clinical  observations, patient history, and epidemiological information. If result is POSITIVE SARS-CoV-2 target  nucleic acids are DETECTED. The SARS-CoV-2 RNA is generally detectable in upper and lower  respiratory specimens dur ing the acute phase of infection.  Positive  results are indicative of active infection with SARS-CoV-2.  Clinical  correlation with patient history and other diagnostic information is  necessary to determine patient infection status.  Positive results do  not rule out bacterial infection or co-infection with other viruses. If result is PRESUMPTIVE POSTIVE SARS-CoV-2 nucleic acids MAY BE PRESENT.   A presumptive positive result was obtained on the submitted specimen  and confirmed on repeat testing.  While 2019 novel coronavirus  (SARS-CoV-2) nucleic acids may be present in the submitted sample  additional confirmatory testing may be necessary for epidemiological  and / or clinical management purposes  to differentiate between  SARS-CoV-2 and other Sarbecovirus currently known to infect humans.  If clinically indicated additional testing with an alternate test  methodology [phone]) is advised. The SARS-CoV-2 RNA is generally  detectable in upper and lower respiratory sp ecimens during the acute  phase of infection. The expected result is Negative. Fact Sheet for Patients:  StrictlyIdeas.no Fact Sheet for Healthcare Providers: BankingDealers.co.za This test is not yet approved or cleared by the [place] FDA and has been authorized for detection and/or diagnosis of SARS-CoV-2 by FDA under an Emergency Use Authorization (EUA).  This EUA will remain in effect (meaning this test can be used) for the duration of the COVID-19 declaration under Section 564(b)(1) of the Act, 21 U.S.C. section 360bbb-3(b)(1), unless the authorization is terminated or revoked sooner. Performed at [facility], [place] [address] [place], [place] [address]   Culture, blood (Routine X 2) w Reflex to ID Panel     Status: None   Collection  Time: 08/23/18 12:15 PM  Result Value Ref Range Status   Specimen Description BLOOD RIGHT ANTECUBITAL  Final   Special Requests   Final    BOTTLES DRAWN AEROBIC AND ANAEROBIC Blood Culture adequate volume   Culture   Final    NO GROWTH 5 DAYS Performed at [facility], [place] [address] [place], [place] [address]    Report Status 08/28/2018 FINAL  Final  MRSA PCR Screening     Status: None   Collection Time: 08/23/18  6:09 PM  Result Value Ref Range Status   MRSA by PCR NEGATIVE NEGATIVE Final    Comment:        The GeneXpert MRSA Assay (FDA approved for NASAL specimens only), is one component of a comprehensive MRSA colonization surveillance program. It is not intended to diagnose MRSA infection nor to guide or monitor treatment for MRSA infections. Performed at [facility], [place] [address] [place], [place] [address]   Culture, blood (Routine X 2) w Reflex to ID Panel     Status: None   Collection Time: 08/24/18  3:08 AM  Result Value Ref Range Status   Specimen Description BLOOD LEFT HAND  Final   Special Requests   Final    BOTTLES DRAWN AEROBIC ONLY Blood Culture adequate volume   Culture   Final    NO GROWTH 5 DAYS Performed at [facility], [place] [address], [place], [place] [address]  Report Status 08/29/2018 FINAL  Final  Culture, respiratory (non-expectorated)     Status: None   Collection Time: 08/26/18  9:04 AM  Result Value Ref Range Status   Specimen Description TRACHEAL ASPIRATE  Final   Special Requests Normal  Final   Gram Stain   Final    FEW WBC PRESENT, PREDOMINANTLY PMN FEW SQUAMOUS EPITHELIAL CELLS PRESENT FEW BUDDING YEAST SEEN Performed at [facility], [address] [place], [place] [address]    Culture FEW CANDIDA ALBICANS RARE STAPHYLOCOCCUS AUREUS   Final   Report Status 08/29/2018 FINAL  Final   Organism ID, Bacteria STAPHYLOCOCCUS AUREUS  Final      Susceptibility   Staphylococcus aureus - MIC*     CIPROFLOXACIN <=0.5 SENSITIVE Sensitive     ERYTHROMYCIN <=0.25 SENSITIVE Sensitive     GENTAMICIN <=0.5 SENSITIVE Sensitive     OXACILLIN 0.5 SENSITIVE Sensitive     TETRACYCLINE <=1 SENSITIVE Sensitive     VANCOMYCIN 1 SENSITIVE Sensitive     TRIMETH/SULFA <=10 SENSITIVE Sensitive     CLINDAMYCIN <=0.25 SENSITIVE Sensitive     RIFAMPIN <=0.5 SENSITIVE Sensitive     Inducible Clindamycin NEGATIVE Sensitive     * RARE STAPHYLOCOCCUS AUREUS    RADIOLOGY STUDIES/RESULTS: Dg Chest 1 View  Result Date: 08/23/2018 CLINICAL DATA:  Central line placement. EXAM: CHEST  1 VIEW COMPARISON:  Radiograph of same day. FINDINGS: The heart size and mediastinal contours are within normal limits. No pneumothorax or pleural effusion is noted. Stable position of right internal jugular catheter with distal tip in expected position of cavoatrial junction. Endotracheal tube is unchanged in position. Both lungs are clear. The visualized skeletal structures are unremarkable. IMPRESSION: Stable support apparatus. No acute cardiopulmonary abnormality seen. Electronically Signed   By: Marijo Conception M.D.   On: 08/23/2018 15:51   Dg Chest 2 View  Result Date: 08/12/2018 CLINICAL DATA:  Left rib pain after fall. EXAM: CHEST - 2 VIEW COMPARISON:  Radiograph of June 15, 2018. FINDINGS: The heart size and mediastinal contours are within normal limits. Both lungs are clear. No pneumothorax or pleural effusion is noted. Old left rib fractures are noted. IMPRESSION: No active cardiopulmonary disease. Electronically Signed   By: Marijo Conception M.D.   On: 08/12/2018 09:27   Ct Head Wo Contrast  Result Date: 08/23/2018 CLINICAL DATA:  Unwitnessed fall. EXAM: CT HEAD WITHOUT CONTRAST CT CERVICAL SPINE WITHOUT CONTRAST TECHNIQUE: Multidetector CT imaging of the head and cervical spine was performed following the standard protocol without intravenous contrast. Multiplanar CT image reconstructions of the cervical spine were also  generated. COMPARISON:  CT scan of February 03, 2018. FINDINGS: CT HEAD FINDINGS Brain: Mild diffuse cortical atrophy is noted. Mild chronic ischemic white matter disease is noted. Stable small calcified right frontal meningioma. No mass effect or midline shift is noted. There is no evidence of hemorrhage or acute infarction. Vascular: No hyperdense vessel or unexpected calcification. Skull: Normal. Negative for fracture or focal lesion. Sinuses/Orbits: No acute finding. Other: None. CT CERVICAL SPINE FINDINGS Alignment: Normal. Skull base and vertebrae: No acute fracture. No primary bone lesion or focal pathologic process. Soft tissues and spinal canal: No prevertebral fluid or swelling. No visible canal hematoma. Disc levels: Severe degenerative disc disease is noted at C6-7 with anterior and posterior osteophyte formation. Upper chest: Negative. Other: None. IMPRESSION: Mild diffuse cortical atrophy. Mild chronic ischemic white matter disease. No acute intracranial abnormality seen. Severe degenerative disc disease is noted at  C6-7. No acute abnormality seen in the cervical spine. Electronically Signed   By: Marijo Conception M.D.   On: 08/23/2018 13:35   Ct Cervical Spine Wo Contrast  Result Date: 08/23/2018 CLINICAL DATA:  Unwitnessed fall. EXAM: CT HEAD WITHOUT CONTRAST CT CERVICAL SPINE WITHOUT CONTRAST TECHNIQUE: Multidetector CT imaging of the head and cervical spine was performed following the standard protocol without intravenous contrast. Multiplanar CT image reconstructions of the cervical spine were also generated. COMPARISON:  CT scan of February 03, 2018. FINDINGS: CT HEAD FINDINGS Brain: Mild diffuse cortical atrophy is noted. Mild chronic ischemic white matter disease is noted. Stable small calcified right frontal meningioma. No mass effect or midline shift is noted. There is no evidence of hemorrhage or acute infarction. Vascular: No hyperdense vessel or unexpected calcification. Skull: Normal.  Negative for fracture or focal lesion. Sinuses/Orbits: No acute finding. Other: None. CT CERVICAL SPINE FINDINGS Alignment: Normal. Skull base and vertebrae: No acute fracture. No primary bone lesion or focal pathologic process. Soft tissues and spinal canal: No prevertebral fluid or swelling. No visible canal hematoma. Disc levels: Severe degenerative disc disease is noted at C6-7 with anterior and posterior osteophyte formation. Upper chest: Negative. Other: None. IMPRESSION: Mild diffuse cortical atrophy. Mild chronic ischemic white matter disease. No acute intracranial abnormality seen. Severe degenerative disc disease is noted at C6-7. No acute abnormality seen in the cervical spine. Electronically Signed   By: Marijo Conception M.D.   On: 08/23/2018 13:35   Dg Chest Port 1 View  Result Date: 08/27/2018 CLINICAL DATA:  63 year old male with respiratory failure, negative for COVID-19 on 08/23/2018. EXAM: PORTABLE CHEST 1 VIEW COMPARISON:  08/26/2018 and earlier. FINDINGS: Portable AP semi upright views at 0445 hours. Endotracheal tube tip at the level the clavicles. Stable right IJ central line. Enteric tube no longer identified. Increasing and confluent retrocardiac opacity with some air bronchograms now, new since 08/24/2018. No pneumothorax, pulmonary edema, pleural effusion or other confluent opacity. Paucity of bowel gas in the upper abdomen. No acute osseous abnormality identified. IMPRESSION: 1. Increasing retrocardiac opacity with air bronchograms. Favor pneumonia. No pleural effusion is evident. 2. Enteric tube no longer identified. Stable ET tube and right IJ central line. Electronically Signed   By: Genevie Ann M.D.   On: 08/27/2018 08:18   Dg Chest Port 1 View  Result Date: 08/26/2018 CLINICAL DATA:  Acute respiratory failure. EXAM: PORTABLE CHEST 1 VIEW COMPARISON:  Radiographs of Aug 24, 2018. FINDINGS: Endotracheal tube is in good position. Right internal jugular catheter is unchanged.  Nasogastric tube is no longer present. No pneumothorax or pleural effusion is noted. Right lung is clear. Increased left basilar opacity is noted concerning for pneumonia or atelectasis. Bony thorax is unremarkable. IMPRESSION: Increased left basilar opacity is noted concerning for worsening pneumonia or atelectasis. Electronically Signed   By: Marijo Conception M.D.   On: 08/26/2018 07:47   Dg Chest Port 1 View  Result Date: 08/24/2018 CLINICAL DATA:  63 year old male with respiratory failure. COVID-19 status pending. EXAM: PORTABLE CHEST 1 VIEW COMPARISON:  Portable chest 08/23/2018 and earlier. FINDINGS: Portable AP semi upright view at 0341 hours. Endotracheal tube tip in good position between the clavicles and carina. An enteric tube has been placed and terminates in the left upper quadrant at the level of the gastric body. Stable right IJ central line. Mildly larger lung volumes. Normal cardiac size and mediastinal contours. Allowing for portable technique the lungs are clear. Paucity of bowel gas. No  acute osseous abnormality identified. IMPRESSION: 1. Stable ET tube and right IJ central line. Enteric tube placed, side hole at the level of the gastric body. 2. No acute cardiopulmonary abnormality. Electronically Signed   By: Genevie Ann M.D.   On: 08/24/2018 05:45   Dg Chest Port 1 View  Result Date: 08/23/2018 CLINICAL DATA:  Status post intubation EXAM: PORTABLE CHEST 1 VIEW COMPARISON:  08/23/2018 FINDINGS: Cardiac shadow is stable. Right jugular central line and endotracheal tube are noted in satisfactory position. No pneumothorax is seen. No focal infiltrate is noted. IMPRESSION: Tubes and lines as described above. No significant interval changes noted. Electronically Signed   By: Inez Catalina M.D.   On: 08/23/2018 15:08   Dg Chest Port 1 View  Result Date: 08/23/2018 CLINICAL DATA:  Altered mental status and possible aspiration. EXAM: PORTABLE CHEST 1 VIEW COMPARISON:  08/12/2018 and prior  radiographs FINDINGS: The cardiomediastinal silhouette is unremarkable. There is no evidence of focal airspace disease, pulmonary edema, suspicious pulmonary nodule/mass, pleural effusion, or pneumothorax. No acute bony abnormalities are identified. Remote LEFT rib fractures again noted. IMPRESSION: No active disease. Electronically Signed   By: Margarette Canada M.D.   On: 08/23/2018 11:20   Dg Abd Portable 1v  Result Date: 08/23/2018 CLINICAL DATA:  OG tube placement EXAM: PORTABLE ABDOMEN - 1 VIEW COMPARISON:  None. FINDINGS: Esophagogastric tube with tip and side port below the diaphragm. Nonobstructive pattern of partially included bowel. IMPRESSION: Esophagogastric tube with tip and side port below the diaphragm. Nonobstructive pattern of partially included bowel. Electronically Signed   By: Eddie Candle M.D.   On: 08/23/2018 19:59   Dg Foot Complete Right  Result Date: 08/12/2018 CLINICAL DATA:  Toe pain after fall. EXAM: RIGHT FOOT COMPLETE - 3+ VIEW COMPARISON:  Radiographs of April 19, 2017 and July 27, 2016. FINDINGS: Status post amputation of first toe. Vascular calcifications are noted. There is noted posterior dislocation of the second middle phalanx relative to the second proximal phalanx. No definite fracture is noted. Linear metallic density is seen in the plantar soft tissues posteriorly underneath the calcaneus consistent with foreign body which was present on prior exam. IMPRESSION: Posterior dislocation of second middle phalanx relative to the second proximal phalanx. No definite fracture is noted. Linear metallic foreign body is seen in plantar soft tissues underneath the calcaneus which was present on prior exam. Electronically Signed   By: Marijo Conception M.D.   On: 08/12/2018 09:30   Dg Swallowing Func-speech Pathology  Result Date: 08/28/2018 Objective Swallowing Evaluation: Type of Study: MBS-Modified Barium Swallow Study  Patient Details Name: [name] MRN: [id number] Date of  Birth: [birth year] Today's Date: 08/28/2018 Time: SLP Start Time (ACUTE ONLY): 0955 -SLP Stop Time (ACUTE ONLY): 1011 SLP Time Calculation (min) (ACUTE ONLY): 16 min Past Medical History: Past Medical History: Diagnosis Date  DEPRESSION   DIABETES MELLITUS, TYPE I   DRUG ABUSE   pt should have NO controlled substances rx'ed  Glaucoma   HEPATITIS C   chronic  Hypertension 02/18/2011  Proliferative diabetic retinopathy(362.02)   Vitiligo  Past Surgical History: Past Surgical History: Procedure Laterality Date  ABDOMINAL AORTOGRAM W/LOWER EXTREMITY N/A 07/25/2016  Procedure: Abdominal Aortogram w/Bilateral Lower Extremity Runoff;  Surgeon: Conrad [place], MD;  Location: [facility];  Service: Cardiovascular;  Laterality: N/A;  ABDOMINAL AORTOGRAM W/LOWER EXTREMITY N/A 12/24/2016  Procedure: ABDOMINAL AORTOGRAM W/LOWER EXTREMITY;  Surgeon: Serafina Mitchell, MD;  Location: [facility];  Service: Cardiovascular;  Laterality: N/A;  AMPUTATION TOE Right 07/26/2016  Procedure: AMPUTATION GREAT TOE;  Surgeon: Serafina Mitchell, MD;  Location: [place];  Service: Vascular;  Laterality: Right;  BIOPSY  08/25/2018  Procedure: BIOPSY;  Surgeon: Irving Copas., MD;  Location: [facility];  Service: Gastroenterology;;  ESOPHAGOGASTRODUODENOSCOPY N/A 08/25/2018  Procedure: ESOPHAGOGASTRODUODENOSCOPY (EGD);  Surgeon: Irving Copas., MD;  Location: [facility];  Service: Gastroenterology;  Laterality: N/A;  EYE SURGERY    retinal surgery x 2, right eye  INSERTION OF ILIAC STENT Right 07/26/2016  Procedure: INSERTION OF RIGHT POPLITEAL STENT WITH BALLOON ANGIOPLASTY;  Surgeon: Serafina Mitchell, MD;  Location: [facility];  Service: Vascular;  Laterality: Right;  LOWER EXTREMITY ANGIOGRAM Right 07/26/2016  Procedure: LOWER EXTREMITY ANGIOGRAM;  Surgeon: Serafina Mitchell, MD;  Location: [facility];  Service: Vascular;  Laterality: Right;  PERIPHERAL VASCULAR BALLOON ANGIOPLASTY Right 07/26/2016  Procedure: PERIPHERAL  VASCULAR BALLOON ANGIOPLASTY  RIGHT ANTERIOR TIBEAL ARTERY AND RIGHT SUPERFICIAL FEMORAL ARTERY;  Surgeon: Serafina Mitchell, MD;  Location: [facility];  Service: Vascular;  Laterality: Right;  PERIPHERAL VASCULAR INTERVENTION Right 12/24/2016  Procedure: PERIPHERAL VASCULAR INTERVENTION;  Surgeon: Serafina Mitchell, MD;  Location: [facility];  Service: Cardiovascular;  Laterality: Right;  lower extr HPI: 63 y.o. male admitted on 08/23/18 for severe diabetic ketoacidosis hypothermia, AMS, hypoxia, elevated amonia, lactic acidosis, hyperkalemia, AKI, UGIB, and aspiration PNA. (+) cocaine and marijuana.  Intubated in the ED on 5/10 extubated 08/27/18.    Pt s/p EGD on 5/12 showing severe esophagitis.  Pt with other significant PMH of diabetic retinopathy, HTN, DM 1, peripheral vascular balloon angio, R toe amputation. MBS in 2019 showed intermittent aspiration of thin liquids with variable sensation.  Subjective: pt is very eager for POs Assessment / Plan / Recommendation CHL IP CLINICAL IMPRESSIONS 08/28/2018 Clinical Impression Pt has a mild-moderate oropharyngeal dysphagia that appears to be near his baseline in comparison to prior MBS studies in 2019. His oral phase is impacted by his poor dentition, with prolonged mastication. He also has decreased bolus cohesion and oral residue that increases with solids. Fluctuating timing of swallow initiation leads to aspiration before the swallow with thin liquids via straw with variable sensation. Chin tuck actually increased the volume aspirated. He had improved airway protection with cup sips and solids. Vallecular residue decreases as boluses become more solid, suspicious for esophageal component particularly in pt with known GER and esophageal issues. Recommend starting Dys 3 diet and thin liquids via cup only. Will f/u briefly for tolerance. SLP Visit Diagnosis Dysphagia, oropharyngeal phase (R13.12) Attention and concentration deficit following -- Frontal lobe and  executive function deficit following -- Impact on safety and function Mild aspiration risk;Moderate aspiration risk   CHL IP TREATMENT RECOMMENDATION 08/28/2018 Treatment Recommendations Therapy as outlined in treatment plan below   Prognosis 08/28/2018 Prognosis for Safe Diet Advancement (No Data) Barriers to Reach Goals -- Barriers/Prognosis Comment -- CHL IP DIET RECOMMENDATION 08/28/2018 SLP Diet Recommendations Dysphagia 3 (Mech soft) solids;Thin liquid Liquid Administration via Cup;No straw Medication Administration Whole meds with puree Compensations Slow rate;Small sips/bites;Clear throat intermittently Postural Changes Seated upright at 90 degrees;Remain semi-upright after after feeds/meals (Comment)   CHL IP OTHER RECOMMENDATIONS 08/28/2018 Recommended Consults -- Oral Care Recommendations Oral care BID Other Recommendations --   CHL IP FOLLOW UP RECOMMENDATIONS 08/28/2018 Follow up Recommendations None   CHL IP FREQUENCY AND DURATION 08/28/2018 Speech Therapy Frequency (ACUTE ONLY) min 2x/week Treatment Duration 1 week      CHL  IP ORAL PHASE 08/28/2018 Oral Phase Impaired Oral - Pudding Teaspoon -- Oral - Pudding Cup -- Oral - Honey Teaspoon -- Oral - Honey Cup -- Oral - Nectar Teaspoon -- Oral - Nectar Cup -- Oral - Nectar Straw -- Oral - Thin Teaspoon -- Oral - Thin Cup Decreased bolus cohesion Oral - Thin Straw Decreased bolus cohesion Oral - Puree Decreased bolus cohesion;Reduced posterior propulsion;Lingual/palatal residue Oral - Mech Soft Decreased bolus cohesion;Reduced posterior propulsion;Lingual/palatal residue;Impaired mastication Oral - Regular -- Oral - Multi-Consistency -- Oral - Pill -- Oral Phase - Comment --  CHL IP PHARYNGEAL PHASE 08/28/2018 Pharyngeal Phase Impaired Pharyngeal- Pudding Teaspoon -- Pharyngeal -- Pharyngeal- Pudding Cup -- Pharyngeal -- Pharyngeal- Honey Teaspoon -- Pharyngeal -- Pharyngeal- Honey Cup -- Pharyngeal -- Pharyngeal- Nectar Teaspoon -- Pharyngeal -- Pharyngeal-  Nectar Cup -- Pharyngeal -- Pharyngeal- Nectar Straw -- Pharyngeal -- Pharyngeal- Thin Teaspoon -- Pharyngeal -- Pharyngeal- Thin Cup Delayed swallow initiation-pyriform sinuses;Penetration/Aspiration before swallow;Pharyngeal residue - valleculae Pharyngeal Material enters airway, remains ABOVE vocal cords then ejected out Pharyngeal- Thin Straw Delayed swallow initiation-pyriform sinuses;Penetration/Aspiration before swallow;Pharyngeal residue - valleculae Pharyngeal Material enters airway, passes BELOW cords without attempt by patient to eject out (silent aspiration);Material enters airway, passes BELOW cords and not ejected out despite cough attempt by patient Pharyngeal- Puree Delayed swallow initiation-vallecula;Pharyngeal residue - valleculae Pharyngeal -- Pharyngeal- Mechanical Soft Delayed swallow initiation-vallecula;Pharyngeal residue - valleculae Pharyngeal -- Pharyngeal- Regular -- Pharyngeal -- Pharyngeal- Multi-consistency -- Pharyngeal -- Pharyngeal- Pill -- Pharyngeal -- Pharyngeal Comment --  CHL IP CERVICAL ESOPHAGEAL PHASE 08/28/2018 Cervical Esophageal Phase Impaired Pudding Teaspoon -- Pudding Cup -- Honey Teaspoon -- Honey Cup -- Nectar Teaspoon -- Nectar Cup -- Nectar Straw -- Thin Teaspoon -- Thin Cup Esophageal backflow into cervical esophagus Thin Straw Esophageal backflow into cervical esophagus Puree -- Mechanical Soft -- Regular -- Multi-consistency -- Pill -- Cervical Esophageal Comment -- Venita Sheffield Nix 08/28/2018, 11:16 AM    Pollyann Glen, M.A. CCC-SLP Acute Rehabilitation Services Pager [phone] Office [phone]             LOS: 6 days   Oren Binet, MD  [name]  If 7PM-7AM, please contact night-coverage  Please page via [email or web address]  Go to [email or web address] and use [facility]'s universal password to access. If you do not have the password, please contact the hospital operator.  Locate the [facility] provider you are looking for under [name] and page to a  number that you can be directly reached. If you still have difficulty reaching the provider, please page the [facility] (Director on Call) for the Hospitalists listed on amion for assistance.  08/29/2018, 11:43 AM

## 2018-08-29 NOTE — NC FL2 (Addendum)
[facility] LEVEL OF CARE SCREENING TOOL     IDENTIFICATION  Patient Name: [name] Birthdate: 1956/02/19 Sex: male Admission Date (Current Location): 08/23/2018  [facility] and [place] Number:  Herbalist and Address:  [facility], [place] [address], [place], [place] [address]      Provider Number: [id number]  Attending Physician Name and Address:  Jonetta Osgood, MD  Relative Name and Phone Number:  Javi Bollman, [phone]    Current Level of Care: Hospital Recommended Level of Care: [place] Prior Approval Number:    Date Approved/Denied: 02/09/18 PASRR Number: [id number]  Discharge Plan: SNF    Current Diagnoses: Patient Active Problem List   Diagnosis Date Noted  . Hypoglycemia associated with diabetes ([place]) 08/12/2018  . At risk for adverse drug event 02/17/2018  . Abrasions of multiple sites   . Demand ischemia of myocardium ([place]) 02/05/2018  . Acute metabolic encephalopathy 27/74/1287  . DKA (diabetic ketoacidoses) ([place]) 02/03/2018  . Leukocytosis 02/03/2018  . Hypoglycemia 12/24/2017  . Syncope 12/24/2017  . Syncope and collapse 12/23/2017  . IV drug abuse ([place]) 04/19/2017  . Acute encephalopathy 04/19/2017  . Hemangioma 10/02/2016  . Foreign body (FB) in soft tissue   . 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[place])   . Gangrene ([place])   . Type I (juvenile type) diabetes mellitus without mention of complication, not stated as uncontrolled   . Tobacco abuse   . Essential hypertension   . Diabetic infection of right foot ([place]) 07/22/2016  . Diabetic foot infection ([place]) 07/22/2016  . Chronic ulcer of heel, right, with fat layer exposed ([place]) 05/20/2016  . DKA, type 1 ([place]) 04/18/2016  . Acute kidney injury ([place]) 04/18/2016  . Hyponatremia 04/18/2016  . Severe protein-calorie malnutrition ([place]) 04/18/2016  . Elevated troponin 04/18/2016  . Poor dentition 07/12/2015  . Tobacco use 07/06/2013   . PAD (peripheral artery disease) ([place]) 04/29/2013  . COPD, mild ([place])   . 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 Diabetic neuropathy ([place]) 06/21/2010  . PROLIFERATIVE DIABETIC RETINOPATHY 06/21/2010  . SKIN TAG 01/30/2010  . Chronic hepatitis C ([place]) 11/22/2008  . VITILIGO 11/22/2008  . PROTEINURIA, MILD 11/22/2008  . Substance abuse ([place]) 04/23/2007  . DEPRESSION 11/11/2006    Orientation RESPIRATION BLADDER Height & Weight     Self, Time, Situation, Place  Normal(Room) External catheter, Incontinent Weight: 158 lb 11.7 oz (72 kg) Height:  6' (182.9 cm)  BEHAVIORAL SYMPTOMS/MOOD NEUROLOGICAL BOWEL NUTRITION STATUS  Dangerous to self, others or property   Continent Diet(NPO except meds;Ice chips PRN after oral care )  AMBULATORY STATUS COMMUNICATION OF NEEDS Skin   Limited Assist Verbally Normal                       Personal Care Assistance Level of Assistance  Total care       Total Care Assistance: Limited assistance   Functional Limitations Info             SPECIAL CARE FACTORS FREQUENCY  PT (By licensed PT), OT (By licensed OT)     PT Frequency: 5x weekly OT Frequency: 5x weekly            Contractures Contractures Info: Not present    Additional Factors Info                  Current Medications (08/29/2018):  This is  the current hospital active medication list Current Facility-Administered Medications  Medication Dose Route Frequency Provider Last Rate Last Dose  . acetaminophen (TYLENOL) tablet 650 mg  650 mg Oral Q6H PRN Ghimire, Henreitta Leber, MD      . Ampicillin-Sulbactam (UNASYN) 3 g in sodium chloride 0.9 % 100 mL IVPB  3 g Intravenous Q6H Sood, Vineet, MD 200 mL/hr at 08/29/18 1659 3 g at 08/29/18 1659  . Chlorhexidine Gluconate Cloth 2 % PADS 6 each  6 each Topical Q0600 Chesley Mires, MD   6 each at 08/29/18 0506  . gabapentin (NEURONTIN) capsule 400 mg  400 mg Oral TID Colbert Coyer, MD   400 mg at 08/29/18 1514  . insulin aspart (novoLOG) injection 1-5 Units  1-5 Units Subcutaneous TID AC & HS Jonetta Osgood, MD   4 Units at 08/29/18 1656  . insulin glargine (LANTUS) injection 15 Units  15 Units Subcutaneous QHS Ghimire, Shanker M, MD      . ipratropium-albuterol (DUONEB) 0.5-2.5 (3) MG/3ML nebulizer solution 3 mL  3 mL Nebulization Q6H PRN Ghimire, Henreitta Leber, MD      . ondansetron [facility]) injection 4 mg  4 mg Intravenous Q6H PRN Jonetta Osgood, MD   4 mg at 08/29/18 1241  . oxyCODONE (Oxy IR/ROXICODONE) immediate release tablet 5 mg  5 mg Oral Q4H PRN Jonetta Osgood, MD   5 mg at 08/29/18 1513  . pantoprazole (PROTONIX) injection 40 mg  40 mg Intravenous Q12H Chesley Mires, MD   40 mg at 08/29/18 0846  . sucralfate (CARAFATE) 1 GM/10ML suspension 1 g  1 g Oral TID WC & HS Ghimire, Henreitta Leber, MD   1 g at 08/29/18 1656     Discharge Medications: Please see discharge summary for a list of discharge medications.  Relevant Imaging Results:  Relevant Lab Results:   Additional [place], LCSW

## 2018-08-29 NOTE — Progress Notes (Signed)
CSW acknowledges that the patient is in need of Glucometer and that he doesn't check CBG before administration of insulin. CSW will inform RNCM and we will work on getting the equipment that he needs.   CSW will continue to monitor.   Domenic Schwab, MSW, [place]

## 2018-08-29 NOTE — Plan of Care (Signed)

## 2018-08-30 LAB — GLUCOSE, CAPILLARY
Glucose-Capillary: 203 mg/dL — ABNORMAL HIGH (ref 70–99)
Glucose-Capillary: 156 mg/dL — ABNORMAL HIGH (ref 70–99)
Glucose-Capillary: 209 mg/dL — ABNORMAL HIGH (ref 70–99)
Glucose-Capillary: 213 mg/dL — ABNORMAL HIGH (ref 70–99)

## 2018-08-30 LAB — BASIC METABOLIC PANEL
Anion gap: 6 (ref 5–15)
BUN: 17 mg/dL (ref 8–23)
CO2: 24 mmol/L (ref 22–32)
Calcium: 7.8 mg/dL — ABNORMAL LOW (ref 8.9–10.3)
Chloride: 112 mmol/L — ABNORMAL HIGH (ref 98–111)
Creatinine, Ser: 1.34 mg/dL — ABNORMAL HIGH (ref 0.61–1.24)
GFR calc Af Amer: >60 mL/min (ref >60)
GFR calc non Af Amer: 57 mL/min — ABNORMAL LOW (ref >60)
Glucose, Bld: 226 mg/dL — ABNORMAL HIGH (ref 70–99)
Potassium: 3.8 mmol/L (ref 3.5–5.1)
Sodium: 142 mmol/L (ref 135–145)

## 2018-08-30 LAB — MAGNESIUM: Magnesium: 1.8 mg/dL (ref 1.7–2.4)

## 2018-08-30 MED ORDER — AMLODIPINE BESYLATE 5 MG PO TABS
5.0000 mg | ORAL_TABLET | Freq: Every day | ORAL | Status: DC
  Administered 2018-08-30 – 2018-09-10 (×12): 5 mg via ORAL
  Filled 2018-08-30 (×12): qty 1

## 2018-08-30 MED ORDER — INSULIN ASPART 100 UNIT/ML ~~LOC~~ SOLN
4.0000 [IU] | Freq: Three times a day (TID) | SUBCUTANEOUS | Status: DC
  Administered 2018-08-30 – 2018-09-01 (×6): 4 [IU] via SUBCUTANEOUS

## 2018-08-30 MED ORDER — OXYCODONE HCL 5 MG PO TABS
5.0000 mg | ORAL_TABLET | ORAL | Status: DC | PRN
  Administered 2018-08-30 (×4): 5 mg via ORAL
  Administered 2018-08-30 – 2018-09-05 (×21): 10 mg via ORAL
  Administered 2018-09-05: 5 mg via ORAL
  Administered 2018-09-06: 10 mg via ORAL
  Administered 2018-09-06: 21:00:00 5 mg via ORAL
  Administered 2018-09-07 – 2018-09-10 (×13): 10 mg via ORAL
  Filled 2018-08-30 (×10): qty 2
  Filled 2018-08-30: qty 1
  Filled 2018-08-30 (×15): qty 2
  Filled 2018-08-30: qty 1
  Filled 2018-08-30 (×2): qty 2
  Filled 2018-08-30: qty 1
  Filled 2018-08-30 (×4): qty 2
  Filled 2018-08-30: qty 1
  Filled 2018-08-30 (×6): qty 2

## 2018-08-30 MED ORDER — INSULIN GLARGINE 100 UNIT/ML ~~LOC~~ SOLN
18.0000 [IU] | Freq: Every day | SUBCUTANEOUS | Status: DC
  Administered 2018-08-30 – 2018-09-01 (×3): 18 [IU] via SUBCUTANEOUS
  Filled 2018-08-30 (×5): qty 0.18

## 2018-08-30 NOTE — Plan of Care (Signed)
  Problem: Clinical Measurements: Goal: Respiratory complications will improve Outcome: Progressing   Problem: Health Behavior/Discharge Planning: Goal: Ability to manage health-related needs will improve Outcome: Progressing   Problem: Pain Managment: Goal: General experience of comfort will improve Outcome: Progressing

## 2018-08-30 NOTE — Progress Notes (Signed)
PROGRESS NOTE        PATIENT DETAILS Name: [name] Age: 63 y.o. Sex: male Date of Birth: 1956/02/21 Admit Date: 08/23/2018 Admitting Physician Brand Males, MD [id number], No Pcp Per  Brief Narrative: Patient is a 63 y.o. male with past medical history of IDDM, HTN, cocaine/marijuana use to the hospital with acute metabolic encephalopathy in the setting of DKA, AKI.  Furthermore patient was found to have septic shock from PNA, and subsequently developed acute blood loss anemia in the setting of GI bleeding.  Patient was admitted to the ICU by PCCM-patient was intubated on admission-patient was given IV antibiotics, underwent EGD on 5/12-subsequently was extubated on 5/14.  Patient was transferred to [facility] on 5/16.  See below for further details  Subjective: Heartburn has improved-claims swallowing is much better as well   Assessment/Plan: Acute metabolic encephalopathy: Resolved-patient is completely awake and alert.  Secondary to DKA, AKI, PNA along with respiratory failure.    Acute hypoxic respiratory failure secondary to aspiration pneumonia: Intubated on admission-extubated on 5/14. Has been titrated down to room air.  Clinically improved-afebrile without any leukocytosis-completed course of Unasyn on 5/16.   Upper GI bleeding with acute blood loss anemia: Underwent EGD on 5/12 which showed severe esophagitis-nonbleeding gastric ulcers-continue PPI and Carafate.  Hemoglobin stable at 8.5, has not required PRBC transfusion.  Plans are to follow hemoglobin closely.  EGD biopsy negative for malignancy  HTN: Blood pressure slowly creeping up-resume amlodipine 5 mg daily and follow.   IDDM: CBG better-a.m. glucose still more than 200-increase Lantus to 18 units, continue 4 units of NovoLog with meals and SSI and follow.    Hypokalemia: Repleted, follow periodically  AKI: Likely hemodynamically mediated-improved  Hypernatremia: Resolved-follow  electrolytes periodically  Peripheral neuropathy: Continue with Neurontin  Cocaine/marijuana use: Counseled  Deconditioning/debility: Secondary to acute/critical illness-probably will require SNF on discharge.  Dysphagia: Secondary to acute illness/debility and esophagitis.  Tolerating dysphagia 3 diet.  SLP following  COVID 19 screen: Negative  DVT Prophylaxis: SCD's given GI bleeding  Code Status: Full code   Family Communication: None at bedside  Disposition Plan: Remain inpatient-probably SNF on discharge-over the next few days.  Social worker following  Antimicrobial agents: Anti-infectives (From admission, onward)   Start     Dose/Rate Route Frequency Ordered Stop   08/27/18 1200  Ampicillin-Sulbactam (UNASYN) 3 g in sodium chloride 0.9 % 100 mL IVPB     3 g 200 mL/hr over 30 Minutes Intravenous Every 6 hours 08/27/18 1033 08/29/18 1729   08/27/18 1152  Ampicillin-Sulbactam (UNASYN) 3 g in sodium chloride 0.9 % 100 mL IVPB  Status:  Discontinued     3 g 200 mL/hr over 30 Minutes Intravenous Every 6 hours 08/27/18 1032 08/27/18 1033   08/26/18 0600  Ampicillin-Sulbactam (UNASYN) 3 g in sodium chloride 0.9 % 100 mL IVPB  Status:  Discontinued     3 g 200 mL/hr over 30 Minutes Intravenous Every 8 hours 08/26/18 0052 08/27/18 1032   08/23/18 1700  Ampicillin-Sulbactam (UNASYN) 3 g in sodium chloride 0.9 % 100 mL IVPB  Status:  Discontinued     3 g 200 mL/hr over 30 Minutes Intravenous Every 12 hours 08/23/18 1657 08/26/18 0052      Procedures: 5/12 EGD ETT 5/10 >> 5/14 Rt IJ CVL 5/10 >> 5/15  CONSULTS:  pulmonary/intensive care and GI  Time spent: 25 minutes-Greater than 50% of this time was spent in counseling, explanation of diagnosis, planning of further management, and coordination of care.  MEDICATIONS: Scheduled Meds:  Chlorhexidine Gluconate Cloth  6 each Topical Q0600   gabapentin  400 mg Oral TID   insulin aspart  1-5 Units Subcutaneous TID AC &  HS   insulin aspart  4 Units Subcutaneous TID WC   insulin glargine  15 Units Subcutaneous QHS   pantoprazole  40 mg Intravenous Q12H   sucralfate  1 g Oral TID WC & HS   Continuous Infusions:  PRN Meds:.acetaminophen, ipratropium-albuterol, ondansetron (ZOFRAN) IV, oxyCODONE   PHYSICAL EXAM: Vital signs: Vitals:   08/29/18 1204 08/29/18 2141 08/30/18 0424 08/30/18 0500  BP: (!) 161/58 (!) 151/62 (!) 154/87   Pulse: 79 81 86   Resp: 20 20 18    Temp: (!) 97.4 F (36.3 C) 98.1 F (36.7 C) 98.8 F (37.1 C)   TempSrc: Oral Oral Oral   SpO2: 99% 95% 93%   Weight:    74.8 kg  Height:       Filed Weights   08/28/18 0437 08/28/18 1128 08/30/18 0500  Weight: 72.4 kg 72 kg 74.8 kg   Body mass index is 22.37 kg/m.   General appearance:Awake, alert, not in any distress.  Eyes:no scleral icterus. HEENT: Atraumatic and Normocephalic Neck: supple, no JVD. Resp:Good air entry bilaterally,no rales or rhonchi CVS: S1 S2 regular, no murmurs.  GI: Bowel sounds present, Non tender and not distended with no gaurding, rigidity or rebound. Extremities: B/L Lower Ext shows no edema, both legs are warm to touch Neurology:  Non focal Psychiatric: Normal judgment and insight. Normal mood. Musculoskeletal:No digital cyanosis Skin:No Rash, warm and dry Wounds:N/A  I have personally reviewed following labs and imaging studies  LABORATORY DATA: CBC: Recent Labs  Lab 08/23/18 1413  08/25/18 1822 08/26/18 0348 08/27/18 0430 08/28/18 0443 08/29/18 0307  WBC 21.7*   < > 18.9* 21.4* 13.5* 9.2 8.9  NEUTROABS 19.2*  --   --   --  10.9*  --   --   HGB 10.4*   < > 9.5* 8.7* 8.7* 8.9* 8.5*  HCT 34.7*   < > 28.7* 26.5* 26.7* 27.3* 26.0*  MCV 97.7   < > 87.8 88.9 90.2 89.8 87.5  PLT 363   < > 118* 112* 107* 148* 191   < > = values in this interval not displayed.    Basic Metabolic Panel: Recent Labs  Lab 08/24/18 0406  08/25/18 0329  08/26/18 0348 08/27/18 0430 08/28/18 0443  08/29/18 0307 08/30/18 0804  NA 150*   < > 151*   < > 150* 150* 149* 141 142  K 3.4*   < > 3.4*   < > 3.5 3.4* 3.1* 3.1* 3.8  CL 118*   < > 122*   < > 119* 115* 118* 113* 112*  CO2 19*   < > 21*   < > 24 25 23 22 24   GLUCOSE 136*   < > 185*   < > 98 225* 226* 298* 226*  BUN 92*   < > 61*   < > 40* 26* 20 22 17   CREATININE 3.85*   < > 2.85*   < > 2.17* 1.61* 1.46* 1.36* 1.34*  CALCIUM 6.8*   < > 6.9*   < > 7.4* 7.9* 7.5* 7.5* 7.8*  MG 2.2  --  1.9  --  1.8 2.2  --   --  1.8  PHOS 3.4  --  2.9  --  2.9 2.3*  --   --   --    < > = values in this interval not displayed.    GFR: Estimated Creatinine Clearance: 61.2 mL/min (A) (by C-G formula based on SCr of 1.34 mg/dL (H)).  Liver Function Tests: Recent Labs  Lab 08/23/18 1413 08/25/18 1111  AST 26 37  ALT 24 26  ALKPHOS 133* 89  BILITOT 1.5* 0.5  PROT 5.3* 4.6*  ALBUMIN 2.5* 1.7*   No results for input(s): LIPASE, AMYLASE in the last 168 hours. Recent Labs  Lab 08/25/18 1111 08/26/18 0348  AMMONIA 21 13    Coagulation Profile: Recent Labs  Lab 08/26/18 0348  INR 1.1    Cardiac Enzymes: Recent Labs  Lab 08/23/18 1839 08/23/18 2223 08/24/18 0406 08/25/18 1111 08/26/18 0348  TROPONINI 0.31* 0.61* 0.94* 0.38* 0.22*    BNP (last 3 results) No results for input(s): PROBNP in the last 8760 hours.  HbA1C: No results for input(s): HGBA1C in the last 72 hours.  CBG: Recent Labs  Lab 08/29/18 1203 08/29/18 1648 08/29/18 2145 08/30/18 0800 08/30/18 1145  GLUCAP 350* 337* 352* 203* 156*    Lipid Profile: No results for input(s): CHOL, HDL, LDLCALC, TRIG, CHOLHDL, LDLDIRECT in the last 72 hours.  Thyroid Function Tests: No results for input(s): TSH, T4TOTAL, FREET4, T3FREE, THYROIDAB in the last 72 hours.  Anemia Panel: Recent Labs    08/29/18 0307  FERRITIN 190  TIBC 167*  IRON 29*    Urine analysis:    Component Value Date/Time   COLORURINE YELLOW (A) 08/23/2018 1055   APPEARANCEUR CLEAR  (A) 08/23/2018 1055   LABSPEC 1.010 08/23/2018 1055   PHURINE 5.0 08/23/2018 1055   GLUCOSEU >1,000 (A) 08/23/2018 1055   GLUCOSEU 500 04/28/2012 1149   HGBUR NEGATIVE 08/23/2018 [place] 08/23/2018 1055   BILIRUBINUR neg 04/28/2012 1053   KETONESUR 15 (A) 08/23/2018 1055   PROTEINUR 30 (A) 08/23/2018 1055   UROBILINOGEN 4.0 (H) 11/16/2013 1646   NITRITE NEGATIVE 08/23/2018 1055   LEUKOCYTESUR NEGATIVE 08/23/2018 1055    Sepsis Labs: Lactic Acid, Venous    Component Value Date/Time   LATICACIDVEN 2.8 (HH) 08/24/2018 0406    MICROBIOLOGY: Recent Results (from the past 240 hour(s))  SARS Coronavirus 2 [facility] order, Performed in [place] hospital lab)     Status: None   Collection Time: 08/23/18 10:55 AM  Result Value Ref Range Status   SARS Coronavirus 2 NEGATIVE NEGATIVE Final    Comment: (NOTE) If result is NEGATIVE SARS-CoV-2 target nucleic acids are NOT DETECTED. The SARS-CoV-2 RNA is generally detectable in upper and lower  respiratory specimens during the acute phase of infection. The lowest  concentration of SARS-CoV-2 viral copies this assay can detect is 250  copies / mL. A negative result does not preclude SARS-CoV-2 infection  and should not be used as the sole basis for treatment or other  patient management decisions.  A negative result may occur with  improper specimen collection / handling, submission of specimen other  than nasopharyngeal swab, presence of viral mutation(s) within the  areas targeted by this assay, and inadequate number of viral copies  (<250 copies / mL). A negative result must be combined with clinical  observations, patient history, and epidemiological information. If result is POSITIVE SARS-CoV-2 target nucleic acids are DETECTED. The SARS-CoV-2 RNA is generally detectable in upper and lower  respiratory specimens dur ing the  acute phase of infection.  Positive  results are indicative of active infection with  SARS-CoV-2.  Clinical  correlation with patient history and other diagnostic information is  necessary to determine patient infection status.  Positive results do  not rule out bacterial infection or co-infection with other viruses. If result is PRESUMPTIVE POSTIVE SARS-CoV-2 nucleic acids MAY BE PRESENT.   A presumptive positive result was obtained on the submitted specimen  and confirmed on repeat testing.  While 2019 novel coronavirus  (SARS-CoV-2) nucleic acids may be present in the submitted sample  additional confirmatory testing may be necessary for epidemiological  and / or clinical management purposes  to differentiate between  SARS-CoV-2 and other Sarbecovirus currently known to infect humans.  If clinically indicated additional testing with an alternate test  methodology [id number]) is advised. The SARS-CoV-2 RNA is generally  detectable in upper and lower respiratory sp ecimens during the acute  phase of infection. The expected result is Negative. Fact Sheet for Patients:  StrictlyIdeas.no Fact Sheet for Healthcare Providers: BankingDealers.co.za This test is not yet approved or cleared by the [place] FDA and has been authorized for detection and/or diagnosis of SARS-CoV-2 by FDA under an Emergency Use Authorization (EUA).  This EUA will remain in effect (meaning this test can be used) for the duration of the COVID-19 declaration under Section 564(b)(1) of the Act, 21 U.S.C. section 360bbb-3(b)(1), unless the authorization is terminated or revoked sooner. Performed at [facility], [place] [address], [place], [place] [address]   Culture, blood (Routine X 2) w Reflex to ID Panel     Status: None   Collection Time: 08/23/18 12:15 PM  Result Value Ref Range Status   Specimen Description BLOOD RIGHT ANTECUBITAL  Final   Special Requests   Final    BOTTLES DRAWN AEROBIC AND ANAEROBIC Blood Culture adequate volume    Culture   Final    NO GROWTH 5 DAYS Performed at [facility], [place] [address], [place], [place] [address]    Report Status 08/28/2018 FINAL  Final  MRSA PCR Screening     Status: None   Collection Time: 08/23/18  6:09 PM  Result Value Ref Range Status   MRSA by PCR NEGATIVE NEGATIVE Final    Comment:        The GeneXpert MRSA Assay (FDA approved for NASAL specimens only), is one component of a comprehensive MRSA colonization surveillance program. It is not intended to diagnose MRSA infection nor to guide or monitor treatment for MRSA infections. Performed at [facility], [place] [address], [place], [place] [address]   Culture, blood (Routine X 2) w Reflex to ID Panel     Status: None   Collection Time: 08/24/18  3:08 AM  Result Value Ref Range Status   Specimen Description BLOOD LEFT HAND  Final   Special Requests   Final    BOTTLES DRAWN AEROBIC ONLY Blood Culture adequate volume   Culture   Final    NO GROWTH 5 DAYS Performed at [facility], [place] [address], [place], [place] [address]    Report Status 08/29/2018 FINAL  Final  Culture, respiratory (non-expectorated)     Status: None   Collection Time: 08/26/18  9:04 AM  Result Value Ref Range Status   Specimen Description TRACHEAL ASPIRATE  Final   Special Requests Normal  Final   Gram Stain   Final    FEW WBC PRESENT, PREDOMINANTLY PMN FEW SQUAMOUS EPITHELIAL CELLS  PRESENT FEW BUDDING YEAST SEEN Performed at [facility], [place] [address], [place], [place] [address]    Culture FEW CANDIDA ALBICANS RARE STAPHYLOCOCCUS AUREUS   Final   Report Status 08/29/2018 FINAL  Final   Organism ID, Bacteria STAPHYLOCOCCUS AUREUS  Final      Susceptibility   Staphylococcus aureus - MIC*    CIPROFLOXACIN <=0.5 SENSITIVE Sensitive     ERYTHROMYCIN <=0.25 SENSITIVE Sensitive     GENTAMICIN <=0.5 SENSITIVE Sensitive     OXACILLIN 0.5 SENSITIVE Sensitive     TETRACYCLINE <=1 SENSITIVE Sensitive     VANCOMYCIN  1 SENSITIVE Sensitive     TRIMETH/SULFA <=10 SENSITIVE Sensitive     CLINDAMYCIN <=0.25 SENSITIVE Sensitive     RIFAMPIN <=0.5 SENSITIVE Sensitive     Inducible Clindamycin NEGATIVE Sensitive     * RARE STAPHYLOCOCCUS AUREUS    RADIOLOGY STUDIES/RESULTS: Dg Chest 1 View  Result Date: 08/23/2018 CLINICAL DATA:  Central line placement. EXAM: CHEST  1 VIEW COMPARISON:  Radiograph of same day. FINDINGS: The heart size and mediastinal contours are within normal limits. No pneumothorax or pleural effusion is noted. Stable position of right internal jugular catheter with distal tip in expected position of cavoatrial junction. Endotracheal tube is unchanged in position. Both lungs are clear. The visualized skeletal structures are unremarkable. IMPRESSION: Stable support apparatus. No acute cardiopulmonary abnormality seen. Electronically Signed   By: Marijo Conception M.D.   On: 08/23/2018 15:51   Dg Chest 2 View  Result Date: 08/12/2018 CLINICAL DATA:  Left rib pain after fall. EXAM: CHEST - 2 VIEW COMPARISON:  Radiograph of June 15, 2018. FINDINGS: The heart size and mediastinal contours are within normal limits. Both lungs are clear. No pneumothorax or pleural effusion is noted. Old left rib fractures are noted. IMPRESSION: No active cardiopulmonary disease. Electronically Signed   By: Marijo Conception M.D.   On: 08/12/2018 09:27   Ct Head Wo Contrast  Result Date: 08/23/2018 CLINICAL DATA:  Unwitnessed fall. EXAM: CT HEAD WITHOUT CONTRAST CT CERVICAL SPINE WITHOUT CONTRAST TECHNIQUE: Multidetector CT imaging of the head and cervical spine was performed following the standard protocol without intravenous contrast. Multiplanar CT image reconstructions of the cervical spine were also generated. COMPARISON:  CT scan of February 03, 2018. FINDINGS: CT HEAD FINDINGS Brain: Mild diffuse cortical atrophy is noted. Mild chronic ischemic white matter disease is noted. Stable small calcified right frontal  meningioma. No mass effect or midline shift is noted. There is no evidence of hemorrhage or acute infarction. Vascular: No hyperdense vessel or unexpected calcification. Skull: Normal. Negative for fracture or focal lesion. Sinuses/Orbits: No acute finding. Other: None. CT CERVICAL SPINE FINDINGS Alignment: Normal. Skull base and vertebrae: No acute fracture. No primary bone lesion or focal pathologic process. Soft tissues and spinal canal: No prevertebral fluid or swelling. No visible canal hematoma. Disc levels: Severe degenerative disc disease is noted at C6-7 with anterior and posterior osteophyte formation. Upper chest: Negative. Other: None. IMPRESSION: Mild diffuse cortical atrophy. Mild chronic ischemic white matter disease. No acute intracranial abnormality seen. Severe degenerative disc disease is noted at C6-7. No acute abnormality seen in the cervical spine. Electronically Signed   By: Marijo Conception M.D.   On: 08/23/2018 13:35   Ct Cervical Spine Wo Contrast  Result Date: 08/23/2018 CLINICAL DATA:  Unwitnessed fall. EXAM: CT HEAD WITHOUT CONTRAST CT CERVICAL SPINE WITHOUT CONTRAST TECHNIQUE: Multidetector CT imaging of the head and cervical spine was performed following the  standard protocol without intravenous contrast. Multiplanar CT image reconstructions of the cervical spine were also generated. COMPARISON:  CT scan of February 03, 2018. FINDINGS: CT HEAD FINDINGS Brain: Mild diffuse cortical atrophy is noted. Mild chronic ischemic white matter disease is noted. Stable small calcified right frontal meningioma. No mass effect or midline shift is noted. There is no evidence of hemorrhage or acute infarction. Vascular: No hyperdense vessel or unexpected calcification. Skull: Normal. Negative for fracture or focal lesion. Sinuses/Orbits: No acute finding. Other: None. CT CERVICAL SPINE FINDINGS Alignment: Normal. Skull base and vertebrae: No acute fracture. No primary bone lesion or focal pathologic  process. Soft tissues and spinal canal: No prevertebral fluid or swelling. No visible canal hematoma. Disc levels: Severe degenerative disc disease is noted at C6-7 with anterior and posterior osteophyte formation. Upper chest: Negative. Other: None. IMPRESSION: Mild diffuse cortical atrophy. Mild chronic ischemic white matter disease. No acute intracranial abnormality seen. Severe degenerative disc disease is noted at C6-7. No acute abnormality seen in the cervical spine. Electronically Signed   By: Marijo Conception M.D.   On: 08/23/2018 13:35   Dg Chest Port 1 View  Result Date: 08/27/2018 CLINICAL DATA:  63 year old male with respiratory failure, negative for COVID-19 on 08/23/2018. EXAM: PORTABLE CHEST 1 VIEW COMPARISON:  08/26/2018 and earlier. FINDINGS: Portable AP semi upright views at 0445 hours. Endotracheal tube tip at the level the clavicles. Stable right IJ central line. Enteric tube no longer identified. Increasing and confluent retrocardiac opacity with some air bronchograms now, new since 08/24/2018. No pneumothorax, pulmonary edema, pleural effusion or other confluent opacity. Paucity of bowel gas in the upper abdomen. No acute osseous abnormality identified. IMPRESSION: 1. Increasing retrocardiac opacity with air bronchograms. Favor pneumonia. No pleural effusion is evident. 2. Enteric tube no longer identified. Stable ET tube and right IJ central line. Electronically Signed   By: Genevie Ann M.D.   On: 08/27/2018 08:18   Dg Chest Port 1 View  Result Date: 08/26/2018 CLINICAL DATA:  Acute respiratory failure. EXAM: PORTABLE CHEST 1 VIEW COMPARISON:  Radiographs of Aug 24, 2018. FINDINGS: Endotracheal tube is in good position. Right internal jugular catheter is unchanged. Nasogastric tube is no longer present. No pneumothorax or pleural effusion is noted. Right lung is clear. Increased left basilar opacity is noted concerning for pneumonia or atelectasis. Bony thorax is unremarkable. IMPRESSION:  Increased left basilar opacity is noted concerning for worsening pneumonia or atelectasis. Electronically Signed   By: Marijo Conception M.D.   On: 08/26/2018 07:47   Dg Chest Port 1 View  Result Date: 08/24/2018 CLINICAL DATA:  63 year old male with respiratory failure. COVID-19 status pending. EXAM: PORTABLE CHEST 1 VIEW COMPARISON:  Portable chest 08/23/2018 and earlier. FINDINGS: Portable AP semi upright view at 0341 hours. Endotracheal tube tip in good position between the clavicles and carina. An enteric tube has been placed and terminates in the left upper quadrant at the level of the gastric body. Stable right IJ central line. Mildly larger lung volumes. Normal cardiac size and mediastinal contours. Allowing for portable technique the lungs are clear. Paucity of bowel gas. No acute osseous abnormality identified. IMPRESSION: 1. Stable ET tube and right IJ central line. Enteric tube placed, side hole at the level of the gastric body. 2. No acute cardiopulmonary abnormality. Electronically Signed   By: Genevie Ann M.D.   On: 08/24/2018 05:45   Dg Chest Port 1 View  Result Date: 08/23/2018 CLINICAL DATA:  Status post intubation EXAM: PORTABLE  CHEST 1 VIEW COMPARISON:  08/23/2018 FINDINGS: Cardiac shadow is stable. Right jugular central line and endotracheal tube are noted in satisfactory position. No pneumothorax is seen. No focal infiltrate is noted. IMPRESSION: Tubes and lines as described above. No significant interval changes noted. Electronically Signed   By: Inez Catalina M.D.   On: 08/23/2018 15:08   Dg Chest Port 1 View  Result Date: 08/23/2018 CLINICAL DATA:  Altered mental status and possible aspiration. EXAM: PORTABLE CHEST 1 VIEW COMPARISON:  08/12/2018 and prior radiographs FINDINGS: The cardiomediastinal silhouette is unremarkable. There is no evidence of focal airspace disease, pulmonary edema, suspicious pulmonary nodule/mass, pleural effusion, or pneumothorax. No acute bony abnormalities  are identified. Remote LEFT rib fractures again noted. IMPRESSION: No active disease. Electronically Signed   By: Margarette Canada M.D.   On: 08/23/2018 11:20   Dg Abd Portable 1v  Result Date: 08/23/2018 CLINICAL DATA:  OG tube placement EXAM: PORTABLE ABDOMEN - 1 VIEW COMPARISON:  None. FINDINGS: Esophagogastric tube with tip and side port below the diaphragm. Nonobstructive pattern of partially included bowel. IMPRESSION: Esophagogastric tube with tip and side port below the diaphragm. Nonobstructive pattern of partially included bowel. Electronically Signed   By: Eddie Candle M.D.   On: 08/23/2018 19:59   Dg Foot Complete Right  Result Date: 08/12/2018 CLINICAL DATA:  Toe pain after fall. EXAM: RIGHT FOOT COMPLETE - 3+ VIEW COMPARISON:  Radiographs of April 19, 2017 and July 27, 2016. FINDINGS: Status post amputation of first toe. Vascular calcifications are noted. There is noted posterior dislocation of the second middle phalanx relative to the second proximal phalanx. No definite fracture is noted. Linear metallic density is seen in the plantar soft tissues posteriorly underneath the calcaneus consistent with foreign body which was present on prior exam. IMPRESSION: Posterior dislocation of second middle phalanx relative to the second proximal phalanx. No definite fracture is noted. Linear metallic foreign body is seen in plantar soft tissues underneath the calcaneus which was present on prior exam. Electronically Signed   By: Marijo Conception M.D.   On: 08/12/2018 09:30   Dg Swallowing Func-speech Pathology  Result Date: 08/28/2018 Objective Swallowing Evaluation: Type of Study: MBS-Modified Barium Swallow Study  Patient Details Name: [name] MRN: [id number] Date of Birth: [birth year] Today's Date: 08/28/2018 Time: SLP Start Time (ACUTE ONLY): 0955 -SLP Stop Time (ACUTE ONLY): 1011 SLP Time Calculation (min) (ACUTE ONLY): 16 min Past Medical History: Past Medical History: Diagnosis Date  DEPRESSION    DIABETES MELLITUS, TYPE I   DRUG ABUSE   pt should have NO controlled substances rx'ed  Glaucoma   HEPATITIS C   chronic  Hypertension 02/18/2011  Proliferative diabetic retinopathy(362.02)   Vitiligo  Past Surgical History: Past Surgical History: Procedure Laterality Date  ABDOMINAL AORTOGRAM W/LOWER EXTREMITY N/A 07/25/2016  Procedure: Abdominal Aortogram w/Bilateral Lower Extremity Runoff;  Surgeon: Conrad [place], MD;  Location: [facility];  Service: Cardiovascular;  Laterality: N/A;  ABDOMINAL AORTOGRAM W/LOWER EXTREMITY N/A 12/24/2016  Procedure: ABDOMINAL AORTOGRAM W/LOWER EXTREMITY;  Surgeon: Serafina Mitchell, MD;  Location: [facility];  Service: Cardiovascular;  Laterality: N/A;  AMPUTATION TOE Right 07/26/2016  Procedure: AMPUTATION GREAT TOE;  Surgeon: Serafina Mitchell, MD;  Location: [place];  Service: Vascular;  Laterality: Right;  BIOPSY  08/25/2018  Procedure: BIOPSY;  Surgeon: Irving Copas., MD;  Location: [place];  Service: Gastroenterology;;  ESOPHAGOGASTRODUODENOSCOPY N/A 08/25/2018  Procedure: ESOPHAGOGASTRODUODENOSCOPY (EGD);  Surgeon: Irving Copas., MD;  Location: [facility];  Service: Gastroenterology;  Laterality: N/A;  EYE SURGERY    retinal surgery x 2, right eye  INSERTION OF ILIAC STENT Right 07/26/2016  Procedure: INSERTION OF RIGHT POPLITEAL STENT WITH BALLOON ANGIOPLASTY;  Surgeon: Serafina Mitchell, MD;  Location: [place];  Service: Vascular;  Laterality: Right;  LOWER EXTREMITY ANGIOGRAM Right 07/26/2016  Procedure: LOWER EXTREMITY ANGIOGRAM;  Surgeon: Serafina Mitchell, MD;  Location: [place];  Service: Vascular;  Laterality: Right;  PERIPHERAL VASCULAR BALLOON ANGIOPLASTY Right 07/26/2016  Procedure: PERIPHERAL VASCULAR BALLOON ANGIOPLASTY  RIGHT ANTERIOR TIBEAL ARTERY AND RIGHT SUPERFICIAL FEMORAL ARTERY;  Surgeon: Serafina Mitchell, MD;  Location: [place];  Service: Vascular;  Laterality: Right;  PERIPHERAL VASCULAR INTERVENTION Right 12/24/2016   Procedure: PERIPHERAL VASCULAR INTERVENTION;  Surgeon: Serafina Mitchell, MD;  Location: [place];  Service: Cardiovascular;  Laterality: Right;  lower extr HPI: 63 y.o. male admitted on 08/23/18 for severe diabetic ketoacidosis hypothermia, AMS, hypoxia, elevated amonia, lactic acidosis, hyperkalemia, AKI, UGIB, and aspiration PNA. (+) cocaine and marijuana.  Intubated in the ED on 5/10 extubated 08/27/18.    Pt s/p EGD on 5/12 showing severe esophagitis.  Pt with other significant PMH of diabetic retinopathy, HTN, DM 1, peripheral vascular balloon angio, R toe amputation. MBS in 2019 showed intermittent aspiration of thin liquids with variable sensation.  Subjective: pt is very eager for POs Assessment / Plan / Recommendation CHL IP CLINICAL IMPRESSIONS 08/28/2018 Clinical Impression Pt has a mild-moderate oropharyngeal dysphagia that appears to be near his baseline in comparison to prior MBS studies in 2019. His oral phase is impacted by his poor dentition, with prolonged mastication. He also has decreased bolus cohesion and oral residue that increases with solids. Fluctuating timing of swallow initiation leads to aspiration before the swallow with thin liquids via straw with variable sensation. Chin tuck actually increased the volume aspirated. He had improved airway protection with cup sips and solids. Vallecular residue decreases as boluses become more solid, suspicious for esophageal component particularly in pt with known GER and esophageal issues. Recommend starting Dys 3 diet and thin liquids via cup only. Will f/u briefly for tolerance. SLP Visit Diagnosis Dysphagia, oropharyngeal phase (R13.12) Attention and concentration deficit following -- Frontal lobe and executive function deficit following -- Impact on safety and function Mild aspiration risk;Moderate aspiration risk   CHL IP TREATMENT RECOMMENDATION 08/28/2018 Treatment Recommendations Therapy as outlined in treatment plan below   Prognosis  08/28/2018 Prognosis for Safe Diet Advancement (No Data) Barriers to Reach Goals -- Barriers/Prognosis Comment -- CHL IP DIET RECOMMENDATION 08/28/2018 SLP Diet Recommendations Dysphagia 3 (Mech soft) solids;Thin liquid Liquid Administration via Cup;No straw Medication Administration Whole meds with puree Compensations Slow rate;Small sips/bites;Clear throat intermittently Postural Changes Seated upright at 90 degrees;Remain semi-upright after after feeds/meals (Comment)   CHL IP OTHER RECOMMENDATIONS 08/28/2018 Recommended Consults -- Oral Care Recommendations Oral care BID Other Recommendations --   CHL IP FOLLOW UP RECOMMENDATIONS 08/28/2018 Follow up Recommendations None   CHL IP FREQUENCY AND DURATION 08/28/2018 Speech Therapy Frequency (ACUTE ONLY) min 2x/week Treatment Duration 1 week      CHL IP ORAL PHASE 08/28/2018 Oral Phase Impaired Oral - Pudding Teaspoon -- Oral - Pudding Cup -- Oral - Honey Teaspoon -- Oral - Honey Cup -- Oral - Nectar Teaspoon -- Oral - Nectar Cup -- Oral - Nectar Straw -- Oral - Thin Teaspoon -- Oral - Thin Cup Decreased bolus cohesion Oral - Thin Straw Decreased bolus cohesion Oral -  Puree Decreased bolus cohesion;Reduced posterior propulsion;Lingual/palatal residue Oral - Mech Soft Decreased bolus cohesion;Reduced posterior propulsion;Lingual/palatal residue;Impaired mastication Oral - Regular -- Oral - Multi-Consistency -- Oral - Pill -- Oral Phase - Comment --  CHL IP PHARYNGEAL PHASE 08/28/2018 Pharyngeal Phase Impaired Pharyngeal- Pudding Teaspoon -- Pharyngeal -- Pharyngeal- Pudding Cup -- Pharyngeal -- Pharyngeal- Honey Teaspoon -- Pharyngeal -- Pharyngeal- Honey Cup -- Pharyngeal -- Pharyngeal- Nectar Teaspoon -- Pharyngeal -- Pharyngeal- Nectar Cup -- Pharyngeal -- Pharyngeal- Nectar Straw -- Pharyngeal -- Pharyngeal- Thin Teaspoon -- Pharyngeal -- Pharyngeal- Thin Cup Delayed swallow initiation-pyriform sinuses;Penetration/Aspiration before swallow;Pharyngeal residue -  valleculae Pharyngeal Material enters airway, remains ABOVE vocal cords then ejected out Pharyngeal- Thin Straw Delayed swallow initiation-pyriform sinuses;Penetration/Aspiration before swallow;Pharyngeal residue - valleculae Pharyngeal Material enters airway, passes BELOW cords without attempt by patient to eject out (silent aspiration);Material enters airway, passes BELOW cords and not ejected out despite cough attempt by patient Pharyngeal- Puree Delayed swallow initiation-vallecula;Pharyngeal residue - valleculae Pharyngeal -- Pharyngeal- Mechanical Soft Delayed swallow initiation-vallecula;Pharyngeal residue - valleculae Pharyngeal -- Pharyngeal- Regular -- Pharyngeal -- Pharyngeal- Multi-consistency -- Pharyngeal -- Pharyngeal- Pill -- Pharyngeal -- Pharyngeal Comment --  CHL IP CERVICAL ESOPHAGEAL PHASE 08/28/2018 Cervical Esophageal Phase Impaired Pudding Teaspoon -- Pudding Cup -- Honey Teaspoon -- Honey Cup -- Nectar Teaspoon -- Nectar Cup -- Nectar Straw -- Thin Teaspoon -- Thin Cup Esophageal backflow into cervical esophagus Thin Straw Esophageal backflow into cervical esophagus Puree -- Mechanical Soft -- Regular -- Multi-consistency -- Pill -- Cervical Esophageal Comment -- Venita Sheffield Nix 08/28/2018, 11:16 AM    Pollyann Glen, M.A. CCC-SLP Acute Rehabilitation Services Pager [phone] Office [phone]             LOS: 7 days   Oren Binet, MD  [name]  If 7PM-7AM, please contact night-coverage  Please page via [email or web address]  Go to [email or web address] and use [facility] universal password to access. If you do not have the password, please contact the hospital operator.  Locate the [facility] provider you are looking for under [name] and page to a number that you can be directly reached. If you still have difficulty reaching the provider, please page the [facility] (Director on Call) for the Hospitalists listed on amion for assistance.  08/30/2018, 11:55 AM

## 2018-08-31 LAB — GLUCOSE, CAPILLARY
Glucose-Capillary: 155 mg/dL — ABNORMAL HIGH (ref 70–99)
Glucose-Capillary: 164 mg/dL — ABNORMAL HIGH (ref 70–99)
Glucose-Capillary: 80 mg/dL (ref 70–99)
Glucose-Capillary: 213 mg/dL — ABNORMAL HIGH (ref 70–99)
Glucose-Capillary: 158 mg/dL — ABNORMAL HIGH (ref 70–99)

## 2018-08-31 LAB — CBC
HCT: 27.1 % — ABNORMAL LOW (ref 39.0–52.0)
Hemoglobin: 8.8 g/dL — ABNORMAL LOW (ref 13.0–17.0)
MCH: 28.9 pg (ref 26.0–34.0)
MCHC: 32.5 g/dL (ref 30.0–36.0)
MCV: 88.9 fL (ref 80.0–100.0)
Platelets: 339 10*3/uL (ref 150–400)
RBC: 3.05 MIL/uL — ABNORMAL LOW (ref 4.22–5.81)
RDW: 14.4 % (ref 11.5–15.5)
WBC: 12.5 10*3/uL — ABNORMAL HIGH (ref 4.0–10.5)
nRBC: 0.2 % (ref 0.0–0.2)

## 2018-08-31 LAB — BASIC METABOLIC PANEL
Anion gap: 6 (ref 5–15)
BUN: 17 mg/dL (ref 8–23)
CO2: 25 mmol/L (ref 22–32)
Calcium: 7.9 mg/dL — ABNORMAL LOW (ref 8.9–10.3)
Chloride: 109 mmol/L (ref 98–111)
Creatinine, Ser: 1.18 mg/dL (ref 0.61–1.24)
GFR calc Af Amer: >60 mL/min (ref >60)
GFR calc non Af Amer: >60 mL/min (ref >60)
Glucose, Bld: 226 mg/dL — ABNORMAL HIGH (ref 70–99)
Potassium: 3.9 mmol/L (ref 3.5–5.1)
Sodium: 140 mmol/L (ref 135–145)

## 2018-08-31 MED ORDER — ADULT MULTIVITAMIN W/MINERALS CH
1.0000 | ORAL_TABLET | Freq: Every day | ORAL | Status: DC
  Administered 2018-08-31 – 2018-09-04 (×5): 1 via ORAL
  Filled 2018-08-31 (×9): qty 1

## 2018-08-31 MED ORDER — GLUCERNA SHAKE PO LIQD
237.0000 mL | Freq: Three times a day (TID) | ORAL | Status: DC
  Administered 2018-08-31 – 2018-09-01 (×4): 237 mL via ORAL

## 2018-08-31 MED ORDER — FLUCONAZOLE 100 MG PO TABS
200.0000 mg | ORAL_TABLET | Freq: Every day | ORAL | Status: DC
  Administered 2018-08-31 – 2018-09-10 (×11): 200 mg via ORAL
  Filled 2018-08-31 (×11): qty 2

## 2018-08-31 MED ORDER — PANTOPRAZOLE SODIUM 40 MG PO PACK
40.0000 mg | PACK | Freq: Two times a day (BID) | ORAL | Status: DC
  Administered 2018-08-31 – 2018-09-10 (×16): 40 mg via ORAL
  Filled 2018-08-31 (×20): qty 20

## 2018-08-31 MED ORDER — SUCRALFATE 1 GM/10ML PO SUSP
2.0000 g | Freq: Three times a day (TID) | ORAL | Status: DC
  Administered 2018-08-31 (×2): 2 g via ORAL
  Filled 2018-08-31 (×4): qty 20

## 2018-08-31 NOTE — TOC Progression Note (Signed)
Transition of Care [facility]) - Progression Note    Patient Details  Name: [name] MRN: [id number] Date of Birth: 05-27-1955  Transition of Care [facility]) CM/SW [place], LCSW Phone Number: 08/31/2018, 1:30 PM  Clinical Narrative:    Currently no bed offers exist for patient due to substance use history. CSW continuing search for placement.    Expected Discharge Plan: Skilled Nursing Facility Barriers to Discharge: Insurance Authorization  Expected Discharge Plan and Services Expected Discharge Plan: [place]       Living arrangements for the past 2 months: Single Family Home Expected Discharge Date: 08/31/18                                     Social Determinants of Health (SDOH) Interventions    Readmission Risk Interventions Readmission Risk Prevention Plan 08/12/2018  Transportation Screening Complete  Medication Review Press photographer) Complete  PCP or Specialist appointment within 3-5 days of discharge Complete  HRI or [place] Complete  SW Recovery Care/Counseling Consult Complete  [place] Not Applicable  Some recent data might be hidden

## 2018-08-31 NOTE — Progress Notes (Signed)
Occupational Therapy Treatment Patient Details Name: [name] MRN: [id number] DOB: Jun 01, 1955 Today's Date: 08/31/2018    History of present illness 63 y.o. male admitted on 08/23/18 for severe diabetic ketoacidosis hypothermia, AMS, hypoxia, elevated amonia, lactic acidosis, hyperkalemia, AKI, UGIB, and aspiration PNA. (+) cocaine and marijuana.  Intubated in the ED on 5/10 extubated 08/27/18.    Pt s/p EGD on 08/25/18.  Pt with other significant PMH of diabetic retinopathy, HTN, DM 1, peripheral vascular balloon angio, R toe amputation.    OT comments  Pt with improved cognition, bed mobility and endurance. Continues to be very unsteady with standing and ambulation. Reports dizziness, VSS, but demonstrating poor safety awareness. Pt dressed with min assist for front opening gown and donned his socks at EOB.. Continues to be appropriate for SNF level rehab.  Follow Up Recommendations  SNF;Supervision/Assistance - 24 hour    Equipment Recommendations  None recommended by OT    Recommendations for Other Services      Precautions / Restrictions Precautions Precautions: Fall       Mobility Bed Mobility Overal bed mobility: Needs Assistance Bed Mobility: Supine to Sit     Supine to sit: HOB elevated;Min guard     General bed mobility comments: use of rail and HOB elevated; increased time and effort   Transfers Overall transfer level: Needs assistance Equipment used: Rolling walker (2 wheeled) Transfers: Sit to/from Stand Sit to Stand: Min assist;+2 physical assistance         General transfer comment: assistance required to power up into standing and to gain balance upon standing; use of RW    Balance Overall balance assessment: Needs assistance Sitting-balance support: Feet supported;Bilateral upper extremity supported Sitting balance-Leahy Scale: Good Sitting balance - Comments: no LOB with donning socks   Standing balance support: Bilateral upper extremity  supported;During functional activity Standing balance-Leahy Scale: Poor                             ADL either performed or assessed with clinical judgement   ADL Overall ADL's : Needs assistance/impaired                 Upper Body Dressing : Minimal assistance;Sitting Upper Body Dressing Details (indicate cue type and reason): front opening gown Lower Body Dressing: Set up;Sitting/lateral leans Lower Body Dressing Details (indicate cue type and reason): donned socks Toilet Transfer: Moderate assistance;Ambulation;RW   Toileting- Clothing Manipulation and Hygiene: Moderate assistance;Sit to/from stand       Functional mobility during ADLs: Moderate assistance;Rolling walker;+2 for safety/equipment       Vision       Perception     Praxis      Cognition Arousal/Alertness: Awake/alert Behavior During Therapy: WFL for tasks assessed/performed Overall Cognitive Status: No family/caregiver present to determine baseline cognitive functioning                         Following Commands: Follows one step commands with increased time Safety/Judgement: Decreased awareness of safety;Decreased awareness of deficits     General Comments: drowsy end of session; pt unable to determine need for rest break despite reporting that "the room is spinning"        Exercises     Shoulder Instructions       General Comments BP in sitting after ambulating 127/50    Pertinent Vitals/ Pain       Pain Assessment: Faces  Faces Pain Scale: No hurt Pain Intervention(s): Premedicated before session;Other (comment)(for neuropathy pain per pt)  Home Living                                          Prior Functioning/Environment              Frequency  Min 2X/week        Progress Toward Goals  OT Goals(current goals can now be found in the care plan section)  Progress towards OT goals: Progressing toward goals  Acute Rehab OT  Goals Patient Stated Goal: to go home OT Goal Formulation: With patient Time For Goal Achievement: 09/11/18 Potential to Achieve Goals: Good  Plan Discharge plan remains appropriate    Co-evaluation    PT/OT/SLP Co-Evaluation/Treatment: Yes Reason for Co-Treatment: For patient/therapist safety PT goals addressed during session: Mobility/safety with mobility;Proper use of DME;Balance OT goals addressed during session: ADL's and self-care      AM-PAC OT "6 Clicks" Daily Activity     Outcome Measure   Help from another person eating meals?: None Help from another person taking care of personal grooming?: A Little Help from another person toileting, which includes using toliet, bedpan, or urinal?: A Lot Help from another person bathing (including washing, rinsing, drying)?: A Little Help from another person to put on and taking off regular upper body clothing?: A Little Help from another person to put on and taking off regular lower body clothing?: A Little 6 Click Score: 18    End of Session Equipment Utilized During Treatment: Gait belt;Rolling walker  OT Visit Diagnosis: Muscle weakness (generalized) (M62.81);Unsteadiness on feet (R26.81)   Activity Tolerance Patient tolerated treatment well   Patient Left in chair;with call bell/phone within reach;with chair alarm set   Nurse Communication          Time: [phone] OT Time Calculation (min): 28 min  Charges: OT General Charges $OT Visit: 1 Visit OT Treatments $Self Care/Home Management : 8-22 mins  Nestor Lewandowsky, OTR/L Acute Rehabilitation Services Pager: [phone] Office: [phone]   Malka So 08/31/2018, 10:53 AM

## 2018-08-31 NOTE — Care Management Important Message (Signed)
Important Message  Patient Details  Name: [name] MRN: [id number] Date of Birth: 04/28/1955   Medicare Important Message Given:   yes    Sharin Mons, RN 08/31/2018, 9:31 AM

## 2018-08-31 NOTE — Progress Notes (Signed)
Pt complained that he did not receive his dinner tray however paper from tray stating 08-30-2018 at 1700 and listing all items pt says he ordered on bedside table. Offered nourishment from unit but pt refused.

## 2018-08-31 NOTE — Progress Notes (Signed)
Physical Therapy Treatment Patient Details Name: [name] MRN: [id number] DOB: Sep 04, 1955 Today's Date: 08/31/2018    History of Present Illness 63 y.o. male admitted on 08/23/18 for severe diabetic ketoacidosis hypothermia, AMS, hypoxia, elevated amonia, lactic acidosis, hyperkalemia, AKI, UGIB, and aspiration PNA. (+) cocaine and marijuana.  Intubated in the ED on 5/10 extubated 08/27/18.    Pt s/p EGD on 08/25/18.  Pt with other significant PMH of diabetic retinopathy, HTN, DM 1, peripheral vascular balloon angio, R toe amputation.     PT Comments    Patient seen for mobility progression. Pt requires min A +2 for sit to stand transfers and mod A +2 for safety with short distance gait training. Pt will continue to benefit from further skilled PT services in both acute and post acute settings to maximize independence and safety with mobility.  Follow Up Recommendations  SNF     Equipment Recommendations  Rolling walker with 5" wheels    Recommendations for Other Services       Precautions / Restrictions Precautions Precautions: Fall    Mobility  Bed Mobility Overal bed mobility: Needs Assistance Bed Mobility: Supine to Sit     Supine to sit: HOB elevated;Min guard     General bed mobility comments: use of rail and HOB elevated; increased time and effort   Transfers Overall transfer level: Needs assistance Equipment used: Rolling walker (2 wheeled) Transfers: Sit to/from Stand Sit to Stand: Min assist;+2 physical assistance         General transfer comment: assistance required to power up into standing and to gain balance upon standing; use of RW  Ambulation/Gait Ambulation/Gait assistance: Mod assist;+2 safety/equipment Gait Distance (Feet): 40 Feet Assistive device: Rolling walker (2 wheeled) Gait Pattern/deviations: Decreased stride length;Step-to pattern;Step-through pattern;Drifts right/left;Narrow base of support Gait velocity: decreased   General Gait  Details: pt with tendency to lean and step toward L side and with poor prioception; bilat LE weakness and posterior LOB requiring increased assistance to recover   Stairs             Wheelchair Mobility    Modified Rankin (Stroke Patients Only)       Balance Overall balance assessment: Needs assistance Sitting-balance support: Feet supported;Bilateral upper extremity supported Sitting balance-Leahy Scale: Fair     Standing balance support: Bilateral upper extremity supported;During functional activity Standing balance-Leahy Scale: Poor                              Cognition Arousal/Alertness: Awake/alert Behavior During Therapy: WFL for tasks assessed/performed Overall Cognitive Status: No family/caregiver present to determine baseline cognitive functioning                           Safety/Judgement: Decreased awareness of safety;Decreased awareness of deficits     General Comments: drowsy end of session; pt unable to determine need for rest break despite reporting that "the room is spinning"      Exercises      General Comments General comments (skin integrity, edema, etc.): BP in sitting after ambulating 127/50      Pertinent Vitals/Pain Pain Assessment: Faces Faces Pain Scale: No hurt Pain Intervention(s): Premedicated before session;Other (comment)(for neuropathy pain per pt)    Home Living                      Prior Function  PT Goals (current goals can now be found in the care plan section) Acute Rehab PT Goals Patient Stated Goal: to go home Progress towards PT goals: Progressing toward goals    Frequency    Min 3X/week      PT Plan Current plan remains appropriate    Co-evaluation PT/OT/SLP Co-Evaluation/Treatment: Yes Reason for Co-Treatment: For patient/therapist safety;To address functional/ADL transfers PT goals addressed during session: Mobility/safety with mobility;Proper use of  DME;Balance        AM-PAC PT "6 Clicks" Mobility   Outcome Measure  Help needed turning from your back to your side while in a flat bed without using bedrails?: A Little Help needed moving from lying on your back to sitting on the side of a flat bed without using bedrails?: A Little Help needed moving to and from a bed to a chair (including a wheelchair)?: A Little Help needed standing up from a chair using your arms (e.g., wheelchair or bedside chair)?: A Lot Help needed to walk in hospital room?: A Lot Help needed climbing 3-5 steps with a railing? : A Lot 6 Click Score: 15    End of Session Equipment Utilized During Treatment: Gait belt Activity Tolerance: Patient tolerated treatment well;Patient limited by fatigue;Other (comment)(c/o dizziness) Patient left: in chair;with call bell/phone within reach;with chair alarm set Nurse Communication: Mobility status PT Visit Diagnosis: Muscle weakness (generalized) (M62.81);Difficulty in walking, not elsewhere classified (R26.2)     Time: 0867-6195 PT Time Calculation (min) (ACUTE ONLY): 23 min  Charges:  $Gait Training: 8-22 mins                     Earney Navy, PTA Acute Rehabilitation Services Pager: [phone] Office: [phone]     Darliss Cheney 08/31/2018, 10:37 AM

## 2018-08-31 NOTE — Care Management Important Message (Signed)
Important Message  Patient Details  Name: [name] MRN: [id number] Date of Birth: 08-15-1955   Medicare Important Message Given:  Yes    Finnegan Gatta Montine Circle 08/31/2018, 4:06 PM

## 2018-08-31 NOTE — Progress Notes (Signed)
Added diflucan po for esophageal candidiasis  Azucena Freed PA-C

## 2018-08-31 NOTE — Progress Notes (Signed)
  Speech Language Pathology Treatment: Dysphagia  Patient Details Name: [name] MRN: [id number] DOB: 03-28-1956 Today's Date: 08/31/2018 Time: 1100-1117 SLP Time Calculation (min) (ACUTE ONLY): 17 min  Assessment / Plan / Recommendation Clinical Impression  Pt continues to demonstrates throat clearing and coughing after even minimal sips of water. Furthermore he reports that he is regurgitating meals and liquids and feels discomfort with intake. Reinforced basic precautions, but other than thickening liquids, there are no more SLP interventions that will decrease the risk of aspiration. Pt reports he does not want to thicken liquids. Suspect ongoing inflammation given dx of esophagitis 4 days ago. Discussed with MD. Continue diet at this time and follow for needs via chart.   HPI        SLP Plan  Continue with current plan of care       Recommendations  Liquids provided via: Cup;Straw Medication Administration: Whole meds with puree Supervision: Patient able to self feed Compensations: Slow rate;Small sips/bites;Clear throat intermittently Postural Changes and/or Swallow Maneuvers: Out of bed for meals;Seated upright 90 degrees                Follow up Recommendations: None Plan: Continue with current plan of care       GO               Herbie [place], [place] Pager [phone] Office [phone]  Lynann Beaver 08/31/2018, 11:25 AM

## 2018-08-31 NOTE — Progress Notes (Signed)
Nutrition Follow-up  RD working remotely.  DOCUMENTATION CODES:   Not applicable  INTERVENTION:   -Glucerna Shake po TID, each supplement provides 220 kcal and 10 grams of protein -MVI with minerals daily  NUTRITION DIAGNOSIS:   Inadequate oral intake related to decreased appetite(esophagitis) as evidenced by per patient/family report.  Progressing  GOAL:   Patient will meet greater than or equal to 90% of their needs  Progressing  MONITOR:   PO intake, Supplement acceptance, Diet advancement, Labs, Weight trends, Skin, I & O's  REASON FOR ASSESSMENT:   Ventilator    ASSESSMENT:   63 yo male with PMH of type 1 DM, hepatitis C, drug abuse, diabetic retinopathy, R great toe amputation, HTN, vitiligo. Admitted with hyperglycemia (glucose 2500), DKA, hypothermia, and gastritis. Tox screens positive for cocaine and marijuana.   5/14- extubated; s/p BSE- recommend NPO; s/p MBSS- advanced to dysphagia 3 diet with thin liquids, transferred from ICU to PCU  Reviewed I/O's: +454 ml x 24 hours and +10.8 L since admission  UOP: 500 ml x 24 hours  Per SLP notes, pt continues with throat clearing and coughing with water, as well as regurgitation with meals. Pt refusing to thicken liquids. Meal completion variable; PO: 15-90%. Per MD notes, pt was recent diagnosed with esophagitis, which may be exacerbating symptoms and plans to increase carafate.   CSW assisting in SNF placement.   Labs reviewed: CBGS: 155-164 (inpatient orders for glycemic control are 1-5 units insulin aspart TID and q HS, 4 units insulin aspart TID with meals, and 18 units insulin glargine q HS).   Diet Order:   Diet Order            DIET DYS 3 Room service appropriate? Yes with Assist; Fluid consistency: Thin  Diet effective now              EDUCATION NEEDS:   No education needs have been identified at this time  Skin:  Skin Assessment: Reviewed RN Assessment  Last BM:  08/29/18  Height:   Ht  Readings from Last 1 Encounters:  08/28/18 6' (1.829 m)    Weight:   Wt Readings from Last 1 Encounters:  08/31/18 76.9 kg    Ideal Body Weight:  80.9 kg  BMI:  Body mass index is 22.99 kg/m.  Estimated Nutritional Needs:   Kcal:  2050-2250  Protein:  90-110 gm  Fluid:  2.2 L  Levone Otten A. Jimmye Norman, RD, LDN, [place] Registered Dietitian II Certified Diabetes Care and Education Specialist Pager: [phone] After hours Pager: [phone]

## 2018-08-31 NOTE — Progress Notes (Signed)
PROGRESS NOTE    [name]  PNT:[id number] DOB: 1955/06/07 DOA: 08/23/2018 PCP: Patient, No Pcp Per   Brief Narrative:  Per HPI: Patient is a 63 y.o. male with past medical history of IDDM, HTN, cocaine/marijuana use to the hospital with acute metabolic encephalopathy in the setting of DKA, AKI.  Furthermore patient was found to have septic shock from PNA, and subsequently developed acute blood loss anemia in the setting of GI bleeding.  Patient was admitted to the ICU by PCCM-patient was intubated on admission-patient was given IV antibiotics, underwent EGD on 5/12-subsequently was extubated on 5/14.  Patient was transferred to [facility] on 5/16.  See below for further details.   Assessment & Plan:   Active Problems:   DKA (diabetic ketoacidoses) ([place])  Acute metabolic encephalopathy: Resolved-patient is completely awake and alert.  Secondary to DKA, AKI, PNA along with respiratory failure.    Acute hypoxic respiratory failure secondary to aspiration pneumonia: Intubated on admission-extubated on 5/14. Has been titrated down to room air.  Clinically improved-afebrile without any leukocytosis-completed course of Unasyn on 5/16.   Upper GI bleeding with acute blood loss anemia: Underwent EGD on 5/12 which showed severe esophagitis-nonbleeding gastric ulcers-continue PPI and Carafate.  Hemoglobin stable at 8.8, has not required PRBC transfusion.  Plans are to follow hemoglobin closely.  EGD biopsy negative for malignancy  HTN: Blood pressure slowly creeping up-resume amlodipine 5 mg daily and follow.   IDDM: CBG better-a.m. glucose still more than 200-increase Lantus to 18 units, continue 4 units of NovoLog with meals and SSI and follow.    Hypokalemia: Repleted, follow periodically  AKI: Likely hemodynamically mediated-improved  Hypernatremia: Resolved-follow electrolytes periodically  Peripheral neuropathy: Continue with Neurontin  Cocaine/marijuana use: Counseled   Deconditioning/debility: Secondary to acute/critical illness-probably will require SNF on discharge.  Dysphagia: Secondary to acute illness/debility and esophagitis.  Tolerating dysphagia 3 diet.  SLP following Discussed with SLP and increased Carafate dosing today  COVID 19 screen: Negative  DVT prophylaxis: SCDs Code Status: Full code Family Communication: None at bedside Disposition Plan: Remain inpatient until SNF arranged   Consultants:   PCCM  GI  Procedures:  5/12 EGD ETT 5/10 >>5/14 Rt IJ CVL 5/10 >>5/15  Antimicrobials:  Anti-infectives (From admission, onward)   Start     Dose/Rate Route Frequency Ordered Stop   08/27/18 1200  Ampicillin-Sulbactam (UNASYN) 3 g in sodium chloride 0.9 % 100 mL IVPB     3 g 200 mL/hr over 30 Minutes Intravenous Every 6 hours 08/27/18 1033 08/29/18 1729   08/27/18 1152  Ampicillin-Sulbactam (UNASYN) 3 g in sodium chloride 0.9 % 100 mL IVPB  Status:  Discontinued     3 g 200 mL/hr over 30 Minutes Intravenous Every 6 hours 08/27/18 1032 08/27/18 1033   08/26/18 0600  Ampicillin-Sulbactam (UNASYN) 3 g in sodium chloride 0.9 % 100 mL IVPB  Status:  Discontinued     3 g 200 mL/hr over 30 Minutes Intravenous Every 8 hours 08/26/18 0052 08/27/18 1032   08/23/18 1700  Ampicillin-Sulbactam (UNASYN) 3 g in sodium chloride 0.9 % 100 mL IVPB  Status:  Discontinued     3 g 200 mL/hr over 30 Minutes Intravenous Every 12 hours 08/23/18 1657 08/26/18 0052        Subjective: Patient seen and evaluated today with no new acute complaints or concerns. No acute concerns or events noted overnight.  Objective: Vitals:   08/31/18 0307 08/31/18 0542 08/31/18 0610 08/31/18 1003  BP:  (!) 152/57  [name])  119/57  Pulse:  87  84  Resp:  18  16  Temp:  98.9 F (37.2 C)  98.9 F (37.2 C)  TempSrc:  Oral  Oral  SpO2:  91%  90%  Weight: 75.5 kg  76.9 kg   Height:        Intake/Output Summary (Last 24 hours) at 08/31/2018 1231 Last data filed at  08/31/2018 0855 Gross per 24 hour  Intake 754 ml  Output 850 ml  Net -96 ml   Filed Weights   08/30/18 0500 08/31/18 0307 08/31/18 0610  Weight: 74.8 kg 75.5 kg 76.9 kg    Examination:  General exam: Appears calm and comfortable  Respiratory system: Clear to auscultation. Respiratory effort normal. Cardiovascular system: S1 & S2 heard, RRR. No JVD, murmurs, rubs, gallops or clicks. No pedal edema. Gastrointestinal system: Abdomen is nondistended, soft and nontender. No organomegaly or masses felt. Normal bowel sounds heard. Central nervous system: Alert and oriented. No focal neurological deficits. Extremities: Symmetric 5 x 5 power. Skin: No rashes, lesions or ulcers Psychiatry: Judgement and insight appear normal. Mood & affect appropriate.     Data Reviewed: I have personally reviewed following labs and imaging studies  CBC: Recent Labs  Lab 08/26/18 0348 08/27/18 0430 08/28/18 0443 08/29/18 0307 08/31/18 0246  WBC 21.4* 13.5* 9.2 8.9 12.5*  NEUTROABS  --  10.9*  --   --   --   HGB 8.7* 8.7* 8.9* 8.5* 8.8*  HCT 26.5* 26.7* 27.3* 26.0* 27.1*  MCV 88.9 90.2 89.8 87.5 88.9  PLT 112* 107* 148* 191 161   Basic Metabolic Panel: Recent Labs  Lab 08/25/18 0329  08/26/18 0348 08/27/18 0430 08/28/18 0443 08/29/18 0307 08/30/18 0804 08/31/18 0246  NA 151*   < > 150* 150* 149* 141 142 140  K 3.4*   < > 3.5 3.4* 3.1* 3.1* 3.8 3.9  CL 122*   < > 119* 115* 118* 113* 112* 109  CO2 21*   < > 24 25 23 22 24 25   GLUCOSE 185*   < > 98 225* 226* 298* 226* 226*  BUN 61*   < > 40* 26* 20 22 17 17   CREATININE 2.85*   < > 2.17* 1.61* 1.46* 1.36* 1.34* 1.18  CALCIUM 6.9*   < > 7.4* 7.9* 7.5* 7.5* 7.8* 7.9*  MG 1.9  --  1.8 2.2  --   --  1.8  --   PHOS 2.9  --  2.9 2.3*  --   --   --   --    < > = values in this interval not displayed.   GFR: Estimated Creatinine Clearance: 71.5 mL/min (by C-G formula based on SCr of 1.18 mg/dL). Liver Function Tests: Recent Labs  Lab  08/25/18 1111  AST 37  ALT 26  ALKPHOS 89  BILITOT 0.5  PROT 4.6*  ALBUMIN 1.7*   No results for input(s): LIPASE, AMYLASE in the last 168 hours. Recent Labs  Lab 08/25/18 1111 08/26/18 0348  AMMONIA 21 13   Coagulation Profile: Recent Labs  Lab 08/26/18 0348  INR 1.1   Cardiac Enzymes: Recent Labs  Lab 08/25/18 1111 08/26/18 0348  TROPONINI 0.38* 0.22*   BNP (last 3 results) No results for input(s): PROBNP in the last 8760 hours. HbA1C: No results for input(s): HGBA1C in the last 72 hours. CBG: Recent Labs  Lab 08/30/18 1713 08/30/18 2032 08/31/18 0704 08/31/18 0758 08/31/18 1157  GLUCAP 209* 213* 155* 164* 80   Lipid  Profile: No results for input(s): CHOL, HDL, LDLCALC, TRIG, CHOLHDL, LDLDIRECT in the last 72 hours. Thyroid Function Tests: No results for input(s): TSH, T4TOTAL, FREET4, T3FREE, THYROIDAB in the last 72 hours. Anemia Panel: Recent Labs    08/29/18 0307  FERRITIN 190  TIBC 167*  IRON 29*   Sepsis Labs: Recent Labs  Lab 08/25/18 0329  PROCALCITON 2.63    Recent Results (from the past 240 hour(s))  SARS Coronavirus 2 [facility] order, Performed in [place] hospital lab)     Status: None   Collection Time: 08/23/18 10:55 AM  Result Value Ref Range Status   SARS Coronavirus 2 NEGATIVE NEGATIVE Final    Comment: (NOTE) If result is NEGATIVE SARS-CoV-2 target nucleic acids are NOT DETECTED. The SARS-CoV-2 RNA is generally detectable in upper and lower  respiratory specimens during the acute phase of infection. The lowest  concentration of SARS-CoV-2 viral copies this assay can detect is 250  copies / mL. A negative result does not preclude SARS-CoV-2 infection  and should not be used as the sole basis for treatment or other  patient management decisions.  A negative result may occur with  improper specimen collection / handling, submission of specimen other  than nasopharyngeal swab, presence of viral mutation(s) within the   areas targeted by this assay, and inadequate number of viral copies  (<250 copies / mL). A negative result must be combined with clinical  observations, patient history, and epidemiological information. If result is POSITIVE SARS-CoV-2 target nucleic acids are DETECTED. The SARS-CoV-2 RNA is generally detectable in upper and lower  respiratory specimens dur ing the acute phase of infection.  Positive  results are indicative of active infection with SARS-CoV-2.  Clinical  correlation with patient history and other diagnostic information is  necessary to determine patient infection status.  Positive results do  not rule out bacterial infection or co-infection with other viruses. If result is PRESUMPTIVE POSTIVE SARS-CoV-2 nucleic acids MAY BE PRESENT.   A presumptive positive result was obtained on the submitted specimen  and confirmed on repeat testing.  While 2019 novel coronavirus  (SARS-CoV-2) nucleic acids may be present in the submitted sample  additional confirmatory testing may be necessary for epidemiological  and / or clinical management purposes  to differentiate between  SARS-CoV-2 and other Sarbecovirus currently known to infect humans.  If clinically indicated additional testing with an alternate test  methodology [phone]) is advised. The SARS-CoV-2 RNA is generally  detectable in upper and lower respiratory sp ecimens during the acute  phase of infection. The expected result is Negative. Fact Sheet for Patients:  StrictlyIdeas.no Fact Sheet for Healthcare Providers: BankingDealers.co.za This test is not yet approved or cleared by the [place] FDA and has been authorized for detection and/or diagnosis of SARS-CoV-2 by FDA under an Emergency Use Authorization (EUA).  This EUA will remain in effect (meaning this test can be used) for the duration of the COVID-19 declaration under Section 564(b)(1) of the Act, 21 U.S.C.  section 360bbb-3(b)(1), unless the authorization is terminated or revoked sooner. Performed at [facility], [place] [address] [place], [place] [address]   Culture, blood (Routine X 2) w Reflex to ID Panel     Status: None   Collection Time: 08/23/18 12:15 PM  Result Value Ref Range Status   Specimen Description BLOOD RIGHT ANTECUBITAL  Final   Special Requests   Final    BOTTLES DRAWN AEROBIC AND ANAEROBIC Blood Culture adequate volume   Culture  Final    NO GROWTH 5 DAYS Performed at [facility], [place] [address], [place], [place] [address]    Report Status 08/28/2018 FINAL  Final  MRSA PCR Screening     Status: None   Collection Time: 08/23/18  6:09 PM  Result Value Ref Range Status   MRSA by PCR NEGATIVE NEGATIVE Final    Comment:        The GeneXpert MRSA Assay (FDA approved for NASAL specimens only), is one component of a comprehensive MRSA colonization surveillance program. It is not intended to diagnose MRSA infection nor to guide or monitor treatment for MRSA infections. Performed at [facility], [place] [address] [place], [place] [address]   Culture, blood (Routine X 2) w Reflex to ID Panel     Status: None   Collection Time: 08/24/18  3:08 AM  Result Value Ref Range Status   Specimen Description BLOOD LEFT HAND  Final   Special Requests   Final    BOTTLES DRAWN AEROBIC ONLY Blood Culture adequate volume   Culture   Final    NO GROWTH 5 DAYS Performed at [facility], [place] [address] [place], [place] [address]    Report Status 08/29/2018 FINAL  Final  Culture, respiratory (non-expectorated)     Status: None   Collection Time: 08/26/18  9:04 AM  Result Value Ref Range Status   Specimen Description TRACHEAL ASPIRATE  Final   Special Requests Normal  Final   Gram Stain   Final    FEW WBC PRESENT, PREDOMINANTLY PMN FEW SQUAMOUS EPITHELIAL CELLS PRESENT FEW BUDDING YEAST SEEN Performed at [facility], [place] [address],  [place], [place] [address]    Culture FEW CANDIDA ALBICANS RARE STAPHYLOCOCCUS AUREUS   Final   Report Status 08/29/2018 FINAL  Final   Organism ID, Bacteria STAPHYLOCOCCUS AUREUS  Final      Susceptibility   Staphylococcus aureus - MIC*    CIPROFLOXACIN <=0.5 SENSITIVE Sensitive     ERYTHROMYCIN <=0.25 SENSITIVE Sensitive     GENTAMICIN <=0.5 SENSITIVE Sensitive     OXACILLIN 0.5 SENSITIVE Sensitive     TETRACYCLINE <=1 SENSITIVE Sensitive     VANCOMYCIN 1 SENSITIVE Sensitive     TRIMETH/SULFA <=10 SENSITIVE Sensitive     CLINDAMYCIN <=0.25 SENSITIVE Sensitive     RIFAMPIN <=0.5 SENSITIVE Sensitive     Inducible Clindamycin NEGATIVE Sensitive     * RARE STAPHYLOCOCCUS AUREUS         Radiology Studies: No results found.      Scheduled Meds: . amLODipine  5 mg Oral Daily  . Chlorhexidine Gluconate Cloth  6 each Topical Q0600  . gabapentin  400 mg Oral TID  . insulin aspart  1-5 Units Subcutaneous TID AC & HS  . insulin aspart  4 Units Subcutaneous TID WC  . insulin glargine  18 Units Subcutaneous QHS  . pantoprazole  40 mg Intravenous Q12H  . sucralfate  2 g Oral TID WC & HS   Continuous Infusions:   LOS: 8 days    Time spent: 30 minutes    Miliana Gangwer Darleen Crocker, DO [name] Pager [phone]  If 7PM-7AM, please contact night-coverage [email or web address] Password [facility] 08/31/2018, 12:31 PM

## 2018-09-01 ENCOUNTER — Encounter: Payer: Self-pay | Admitting: Gastroenterology

## 2018-09-01 ENCOUNTER — Inpatient Hospital Stay (HOSPITAL_COMMUNITY): Payer: Medicare HMO

## 2018-09-01 LAB — BASIC METABOLIC PANEL
Anion gap: 6 (ref 5–15)
BUN: 16 mg/dL (ref 8–23)
CO2: 25 mmol/L (ref 22–32)
Calcium: 7.8 mg/dL — ABNORMAL LOW (ref 8.9–10.3)
Chloride: 106 mmol/L (ref 98–111)
Creatinine, Ser: 1.24 mg/dL (ref 0.61–1.24)
GFR calc Af Amer: >60 mL/min (ref >60)
GFR calc non Af Amer: >60 mL/min (ref >60)
Glucose, Bld: 154 mg/dL — ABNORMAL HIGH (ref 70–99)
Potassium: 3.6 mmol/L (ref 3.5–5.1)
Sodium: 137 mmol/L (ref 135–145)

## 2018-09-01 LAB — CBC
HCT: 23.8 % — ABNORMAL LOW (ref 39.0–52.0)
Hemoglobin: 7.8 g/dL — ABNORMAL LOW (ref 13.0–17.0)
MCH: 29 pg (ref 26.0–34.0)
MCHC: 32.8 g/dL (ref 30.0–36.0)
MCV: 88.5 fL (ref 80.0–100.0)
Platelets: 380 10*3/uL (ref 150–400)
RBC: 2.69 MIL/uL — ABNORMAL LOW (ref 4.22–5.81)
RDW: 14.4 % (ref 11.5–15.5)
WBC: 11.1 10*3/uL — ABNORMAL HIGH (ref 4.0–10.5)
nRBC: 0.2 % (ref 0.0–0.2)

## 2018-09-01 LAB — GLUCOSE, CAPILLARY
Glucose-Capillary: 87 mg/dL (ref 70–99)
Glucose-Capillary: 116 mg/dL — ABNORMAL HIGH (ref 70–99)
Glucose-Capillary: 154 mg/dL — ABNORMAL HIGH (ref 70–99)
Glucose-Capillary: 156 mg/dL — ABNORMAL HIGH (ref 70–99)

## 2018-09-01 MED ORDER — SUCRALFATE 1 G PO TABS
2.0000 g | ORAL_TABLET | Freq: Three times a day (TID) | ORAL | Status: DC
  Administered 2018-09-01 – 2018-09-03 (×9): 2 g via ORAL
  Filled 2018-09-01 (×10): qty 2

## 2018-09-01 MED ORDER — PIPERACILLIN-TAZOBACTAM 3.375 G IVPB
3.3750 g | Freq: Three times a day (TID) | INTRAVENOUS | Status: DC
  Administered 2018-09-01 – 2018-09-02 (×3): 3.375 g via INTRAVENOUS
  Filled 2018-09-01 (×3): qty 50

## 2018-09-01 NOTE — Progress Notes (Signed)
Physical Therapy Treatment Patient Details Name: [name] MRN: [id number] DOB: 02/09/1956 Today's Date: 09/01/2018    History of Present Illness 63 y.o. male admitted on 08/23/18 for severe diabetic ketoacidosis hypothermia, AMS, hypoxia, elevated amonia, lactic acidosis, hyperkalemia, AKI, UGIB, and aspiration PNA. (+) cocaine and marijuana.  Intubated in the ED on 5/10 extubated 08/27/18.    Pt s/p EGD on 08/25/18.  Pt with other significant PMH of diabetic retinopathy, HTN, DM 1, peripheral vascular balloon angio, R toe amputation.     PT Comments    Mr Heindl ambulated 67' with RW and min A with fwd flexed posture and 2 standing rest breaks needed, DOE 2/4. Pt dependent on RW for balance, was unable to let go of RW and maintain static balance without UE support. Continue to recommend further rehab for pt at d/c due to fall risk/ safety concerns. PT will continue to follow.    Follow Up Recommendations  SNF     Equipment Recommendations  Rolling walker with 5" wheels    Recommendations for Other Services       Precautions / Restrictions Precautions Precautions: Fall Precaution Comments: last admission note had him using a RW, very weak Restrictions Weight Bearing Restrictions: No    Mobility  Bed Mobility               General bed mobility comments: pt received on BSC  Transfers Overall transfer level: Needs assistance Equipment used: Rolling walker (2 wheeled) Transfers: Sit to/from Stand Sit to Stand: Min assist Stand pivot transfers: Min guard       General transfer comment: min A to steady and increased time and effort by pt. Despite that pt was agreeable to ambulation, when he first rose from [facility], immediately said he needed to take a rest break EOB. Attempted to lie down at one point but encouraged to ambulate and he agreed  Ambulation/Gait Ambulation/Gait assistance: +2 safety/equipment;Min assist(chair brought behind) Gait Distance (Feet): 75  Feet Assistive device: Rolling walker (2 wheeled) Gait Pattern/deviations: Decreased stride length;Step-to pattern;Step-through pattern;Drifts right/left;Narrow base of support Gait velocity: decreased Gait velocity interpretation: <1.8 ft/sec, indicate of risk for recurrent falls General Gait Details: pt needed 2 standing rest breaks. Very fatigued by end of ambulation but pt wanted to meet goal of 75'.    Stairs             Wheelchair Mobility    Modified Rankin (Stroke Patients Only)       Balance Overall balance assessment: Needs assistance Sitting-balance support: Feet supported;No upper extremity supported Sitting balance-Leahy Scale: Good     Standing balance support: Bilateral upper extremity supported;During functional activity Standing balance-Leahy Scale: Poor Standing balance comment: unable to let go of RW to don mask without losing balance, min A given                            Cognition Arousal/Alertness: Awake/alert Behavior During Therapy: WFL for tasks assessed/performed;Impulsive Overall Cognitive Status: No family/caregiver present to determine baseline cognitive functioning Area of Impairment: Following commands;Safety/judgement;Problem solving                   Current Attention Level: Selective Memory: Decreased recall of precautions;Decreased short-term memory Following Commands: Follows one step commands consistently;Follows multi-step commands with increased time Safety/Judgement: Decreased awareness of safety;Decreased awareness of deficits Awareness: Emergent Problem Solving: Difficulty sequencing;Requires verbal cues General Comments: impulsive with mobility      Exercises  General Comments General comments (skin integrity, edema, etc.): 2/4 DOE with ambulation      Pertinent Vitals/Pain Pain Assessment: No/denies pain    Home Living                      Prior Function            PT Goals  (current goals can now be found in the care plan section) Acute Rehab PT Goals Patient Stated Goal: to go home PT Goal Formulation: With patient Time For Goal Achievement: 09/10/18 Potential to Achieve Goals: Good Progress towards PT goals: Progressing toward goals    Frequency    Min 3X/week      PT Plan Current plan remains appropriate    Co-evaluation PT/OT/SLP Co-Evaluation/Treatment: Yes            AM-PAC PT "6 Clicks" Mobility   Outcome Measure  Help needed turning from your back to your side while in a flat bed without using bedrails?: A Little Help needed moving from lying on your back to sitting on the side of a flat bed without using bedrails?: A Little Help needed moving to and from a bed to a chair (including a wheelchair)?: A Little Help needed standing up from a chair using your arms (e.g., wheelchair or bedside chair)?: A Little Help needed to walk in hospital room?: A Lot Help needed climbing 3-5 steps with a railing? : A Lot 6 Click Score: 16    End of Session Equipment Utilized During Treatment: Gait belt Activity Tolerance: Patient tolerated treatment well;Patient limited by fatigue Patient left: in chair;with call bell/phone within reach;with chair alarm set Nurse Communication: Mobility status PT Visit Diagnosis: Muscle weakness (generalized) (M62.81);Difficulty in walking, not elsewhere classified (R26.2)     Time: 5009-3818 PT Time Calculation (min) (ACUTE ONLY): 18 min  Charges:  $Gait Training: 8-22 mins                     Leighton Roach, [place]  Pager [phone] Office [place] 09/01/2018, 1:36 PM

## 2018-09-01 NOTE — Progress Notes (Signed)
Pharmacy Antibiotic Note  [name] is a 63 y.o. male admitted on 08/23/2018 with concerns for aspiration pneumonia. Pharmacy has been consulted for Zosyn dosing; already completed a course of Unasyn. WBC 11.1. Renal function stable, SCr 1.24, CrCl ~65 ml/min.   Plan: Zosyn 3.375 g IV q8h to be infused over 4 hours Pharmacy signing off - please re-consult if needed   Temp (24hrs), Avg:99.5 F (37.5 C), Min:98.2 F (36.8 C), Max:102.7 F (39.3 C)  Recent Labs  Lab 08/27/18 0430 08/28/18 0443 08/29/18 0307 08/30/18 0804 08/31/18 0246 09/01/18 0246  WBC 13.5* 9.2 8.9  --  12.5* 11.1*  CREATININE 1.61* 1.46* 1.36* 1.34* 1.18 1.24    Estimated Creatinine Clearance: 65.3 mL/min (by C-G formula based on SCr of 1.24 mg/dL).    Allergies  Allergen Reactions  . Codeine Itching    Tolerates hydrocodone/apap    Antimicrobials this admission: Unasyn 5/10 >>5/16 Zosyn 5/19 >>  Microbiology results: 5/13 TA - rare MSSA, few Candida 5/10 BCx - negative 5/10 covid - negative 5/10 MRSA PCR - negative  Renold Genta, PharmD, BCPS Clinical Pharmacist Clinical phone for 09/01/2018 until 8p is [phone] 09/01/2018 4:08 PM  **Pharmacist phone directory can now be found on [email or web address] listed under [place]**

## 2018-09-01 NOTE — Progress Notes (Addendum)
PROGRESS NOTE    [name]  XHB:[id number] DOB: Aug 23, 1955 DOA: 08/23/2018 PCP: Patient, No Pcp Per   Brief Narrative:  Per HPI: Patient is a62 y.o.male with past medical history of IDDM, HTN, cocaine/marijuana use to the hospital with acute metabolic encephalopathy in the setting of DKA, AKI. Furthermore patient was found to have septic shock from PNA, and subsequently developed acute blood loss anemia in the setting of GI bleeding. Patient was admitted to the ICU by PCCM-patient was intubated on admission-patient was given IV antibiotics, underwent EGD on 5/12-subsequently was extubated on 5/14. Patient was transferred to [facility] on 5/16. See below for further details.  He is currently awaiting transfer to SNF and has been tolerating dysphagia 3 diet.  Assessment & Plan:   Active Problems:   DKA (diabetic ketoacidoses) ([place])  Acute metabolic encephalopathy: Resolved-patient is completely awake and alert. Secondary to DKA, AKI, PNA along with respiratory failure.   Acute hypoxic respiratory failure secondary to aspiration pneumonia: Intubated on admission-extubated on 5/14. Has been titrated down to room air.Clinically improved-afebrile without any leukocytosis-completed course of Unasyn on 5/16.   Upper GI bleeding with acute blood loss anemia:Underwent EGD on 5/12 which showed severe esophagitis-nonbleeding gastric ulcers-continue PPI twice daily and Carafate. Hemoglobin downtrending at 7.8, has not required PRBC transfusion. Plans are to follow hemoglobin closely with repeat CBC in a.m. EGD biopsy negative for malignancy.  GI is peripherally following and has started Diflucan for possible Candida esophagitis on 5/18.  Please call them again for any concerns related to acute GI bleed.  Currently with some darker stools, but this has been an ongoing.  HTN: Blood pressurenow stabilized on amlodipine 5 mg added on 5/17.  IDDM:CBG better-continue Lantus at 18 units,  continue 4 units of NovoLog with meals and SSI and follow.  Hypokalemia:Repleted, follow periodically  YBO:FBPZWC hemodynamically mediated-improved  Hypernatremia: Resolved-follow electrolytes periodically  Peripheral neuropathy:Continue with Neurontin  Cocaine/marijuana HEN:IDPOEUMPN  Deconditioning/debility: Secondary to acute/critical illness-probably will require SNF on discharge.  Dysphagia: Secondary to acute illness/debility and esophagitis. Tolerating dysphagia 3 diet. SLP following Discussed with SLP on 5/18 and increase Carafate to this at that time GI has empirically started patient on Diflucan for total 14-day course on 5/18  COVID 19 screen: Negative  DVT prophylaxis: SCDs Code Status: Full code Family Communication: None at bedside Disposition Plan: Remain inpatient until SNF arranged; CSW having difficult time with bed placement on account of prior cocaine use.   Consultants:   PCCM  GI  Procedures:  5/12 EGD ETT 5/10 >>5/14 Rt IJ CVL 5/10 >>5/15  Antimicrobials:  Anti-infectives (From admission, onward)   Start     Dose/Rate Route Frequency Ordered Stop   08/31/18 1700  fluconazole (DIFLUCAN) tablet 200 mg     200 mg Oral Daily 08/31/18 1646 09/14/18 0959   08/27/18 1200  Ampicillin-Sulbactam (UNASYN) 3 g in sodium chloride 0.9 % 100 mL IVPB     3 g 200 mL/hr over 30 Minutes Intravenous Every 6 hours 08/27/18 1033 08/29/18 1729   08/27/18 1152  Ampicillin-Sulbactam (UNASYN) 3 g in sodium chloride 0.9 % 100 mL IVPB  Status:  Discontinued     3 g 200 mL/hr over 30 Minutes Intravenous Every 6 hours 08/27/18 1032 08/27/18 1033   08/26/18 0600  Ampicillin-Sulbactam (UNASYN) 3 g in sodium chloride 0.9 % 100 mL IVPB  Status:  Discontinued     3 g 200 mL/hr over 30 Minutes Intravenous Every 8 hours 08/26/18 0052 08/27/18 1032  08/23/18 1700  Ampicillin-Sulbactam (UNASYN) 3 g in sodium chloride 0.9 % 100 mL IVPB  Status:  Discontinued      3 g 200 mL/hr over 30 Minutes Intravenous Every 12 hours 08/23/18 1657 08/26/18 0052          Subjective: Patient seen and evaluated today with no new acute complaints or concerns. No acute concerns or events noted overnight.  He is eager to go to rehabilitation once bed is available.  He continues to have some mild symptoms of regurgitation for which Diflucan was added on 5/18.  He describes some ongoing dark, runny stools, but this is not unusual for him in the last 1 week.  Objective: Vitals:   08/31/18 2035 09/01/18 0058 09/01/18 0419 09/01/18 0620  BP: 133/62 (!) 101/58  (!) 129/51  Pulse: 92 83  88  Resp:      Temp: 98.5 F (36.9 C) 98.2 F (36.8 C)  99 F (37.2 C)  TempSrc: Oral Oral  Oral  SpO2: 91% 100%  92%  Weight:   73.8 kg   Height:        Intake/Output Summary (Last 24 hours) at 09/01/2018 0832 Last data filed at 09/01/2018 0617 Gross per 24 hour  Intake 240 ml  Output 1200 ml  Net -960 ml   Filed Weights   08/31/18 0307 08/31/18 0610 09/01/18 0419  Weight: 75.5 kg 76.9 kg 73.8 kg    Examination:  General exam: Appears calm and comfortable  Respiratory system: Clear to auscultation. Respiratory effort normal. Cardiovascular system: S1 & S2 heard, RRR. No JVD, murmurs, rubs, gallops or clicks. No pedal edema. Gastrointestinal system: Abdomen is nondistended, soft and nontender. No organomegaly or masses felt. Normal bowel sounds heard. Central nervous system: Alert and oriented. No focal neurological deficits. Extremities: Symmetric 5 x 5 power. Skin: No rashes, lesions or ulcers Psychiatry: Judgement and insight appear normal. Mood & affect appropriate.     Data Reviewed: I have personally reviewed following labs and imaging studies  CBC: Recent Labs  Lab 08/27/18 0430 08/28/18 0443 08/29/18 0307 08/31/18 0246 09/01/18 0246  WBC 13.5* 9.2 8.9 12.5* 11.1*  NEUTROABS 10.9*  --   --   --   --   HGB 8.7* 8.9* 8.5* 8.8* 7.8*  HCT 26.7* 27.3*  26.0* 27.1* 23.8*  MCV 90.2 89.8 87.5 88.9 88.5  PLT 107* 148* 191 339 326   Basic Metabolic Panel: Recent Labs  Lab 08/26/18 0348 08/27/18 0430 08/28/18 0443 08/29/18 0307 08/30/18 0804 08/31/18 0246 09/01/18 0246  NA 150* 150* 149* 141 142 140 137  K 3.5 3.4* 3.1* 3.1* 3.8 3.9 3.6  CL 119* 115* 118* 113* 112* 109 106  CO2 24 25 23 22 24 25 25   GLUCOSE 98 225* 226* 298* 226* 226* 154*  BUN 40* 26* 20 22 17 17 16   CREATININE 2.17* 1.61* 1.46* 1.36* 1.34* 1.18 1.24  CALCIUM 7.4* 7.9* 7.5* 7.5* 7.8* 7.9* 7.8*  MG 1.8 2.2  --   --  1.8  --   --   PHOS 2.9 2.3*  --   --   --   --   --    GFR: Estimated Creatinine Clearance: 65.3 mL/min (by C-G formula based on SCr of 1.24 mg/dL). Liver Function Tests: Recent Labs  Lab 08/25/18 1111  AST 37  ALT 26  ALKPHOS 89  BILITOT 0.5  PROT 4.6*  ALBUMIN 1.7*   No results for input(s): LIPASE, AMYLASE in the last 168 hours. Recent  Labs  Lab 08/25/18 1111 08/26/18 0348  AMMONIA 21 13   Coagulation Profile: Recent Labs  Lab 08/26/18 0348  INR 1.1   Cardiac Enzymes: Recent Labs  Lab 08/25/18 1111 08/26/18 0348  TROPONINI 0.38* 0.22*   BNP (last 3 results) No results for input(s): PROBNP in the last 8760 hours. HbA1C: No results for input(s): HGBA1C in the last 72 hours. CBG: Recent Labs  Lab 08/31/18 0758 08/31/18 1157 08/31/18 1643 08/31/18 2219 09/01/18 0816  GLUCAP 164* 80 213* 158* 87   Lipid Profile: No results for input(s): CHOL, HDL, LDLCALC, TRIG, CHOLHDL, LDLDIRECT in the last 72 hours. Thyroid Function Tests: No results for input(s): TSH, T4TOTAL, FREET4, T3FREE, THYROIDAB in the last 72 hours. Anemia Panel: No results for input(s): VITAMINB12, FOLATE, FERRITIN, TIBC, IRON, RETICCTPCT in the last 72 hours. Sepsis Labs: No results for input(s): PROCALCITON, LATICACIDVEN in the last 168 hours.  Recent Results (from the past 240 hour(s))  SARS Coronavirus 2 [facility] order, Performed in [place]  hospital lab)     Status: None   Collection Time: 08/23/18 10:55 AM  Result Value Ref Range Status   SARS Coronavirus 2 NEGATIVE NEGATIVE Final    Comment: (NOTE) If result is NEGATIVE SARS-CoV-2 target nucleic acids are NOT DETECTED. The SARS-CoV-2 RNA is generally detectable in upper and lower  respiratory specimens during the acute phase of infection. The lowest  concentration of SARS-CoV-2 viral copies this assay can detect is 250  copies / mL. A negative result does not preclude SARS-CoV-2 infection  and should not be used as the sole basis for treatment or other  patient management decisions.  A negative result may occur with  improper specimen collection / handling, submission of specimen other  than nasopharyngeal swab, presence of viral mutation(s) within the  areas targeted by this assay, and inadequate number of viral copies  (<250 copies / mL). A negative result must be combined with clinical  observations, patient history, and epidemiological information. If result is POSITIVE SARS-CoV-2 target nucleic acids are DETECTED. The SARS-CoV-2 RNA is generally detectable in upper and lower  respiratory specimens dur ing the acute phase of infection.  Positive  results are indicative of active infection with SARS-CoV-2.  Clinical  correlation with patient history and other diagnostic information is  necessary to determine patient infection status.  Positive results do  not rule out bacterial infection or co-infection with other viruses. If result is PRESUMPTIVE POSTIVE SARS-CoV-2 nucleic acids MAY BE PRESENT.   A presumptive positive result was obtained on the submitted specimen  and confirmed on repeat testing.  While 2019 novel coronavirus  (SARS-CoV-2) nucleic acids may be present in the submitted sample  additional confirmatory testing may be necessary for epidemiological  and / or clinical management purposes  to differentiate between  SARS-CoV-2 and other Sarbecovirus  currently known to infect humans.  If clinically indicated additional testing with an alternate test  methodology [phone]) is advised. The SARS-CoV-2 RNA is generally  detectable in upper and lower respiratory sp ecimens during the acute  phase of infection. The expected result is Negative. Fact Sheet for Patients:  StrictlyIdeas.no Fact Sheet for Healthcare Providers: BankingDealers.co.za This test is not yet approved or cleared by the [place] FDA and has been authorized for detection and/or diagnosis of SARS-CoV-2 by FDA under an Emergency Use Authorization (EUA).  This EUA will remain in effect (meaning this test can be used) for the duration of the COVID-19 declaration under Section 564(b)(1) of  the Act, 21 U.S.C. section 360bbb-3(b)(1), unless the authorization is terminated or revoked sooner. Performed at [facility], [place] [address], [place], [place] [address]   Culture, blood (Routine X 2) w Reflex to ID Panel     Status: None   Collection Time: 08/23/18 12:15 PM  Result Value Ref Range Status   Specimen Description BLOOD RIGHT ANTECUBITAL  Final   Special Requests   Final    BOTTLES DRAWN AEROBIC AND ANAEROBIC Blood Culture adequate volume   Culture   Final    NO GROWTH 5 DAYS Performed at [facility], [place] [address] [place], [place] [address]    Report Status 08/28/2018 FINAL  Final  MRSA PCR Screening     Status: None   Collection Time: 08/23/18  6:09 PM  Result Value Ref Range Status   MRSA by PCR NEGATIVE NEGATIVE Final    Comment:        The GeneXpert MRSA Assay (FDA approved for NASAL specimens only), is one component of a comprehensive MRSA colonization surveillance program. It is not intended to diagnose MRSA infection nor to guide or monitor treatment for MRSA infections. Performed at [facility], [place] [address], [place], [place] [address]   Culture, blood (Routine X 2) w Reflex  to ID Panel     Status: None   Collection Time: 08/24/18  3:08 AM  Result Value Ref Range Status   Specimen Description BLOOD LEFT HAND  Final   Special Requests   Final    BOTTLES DRAWN AEROBIC ONLY Blood Culture adequate volume   Culture   Final    NO GROWTH 5 DAYS Performed at [facility], [place] [address] [place], [place] [address]    Report Status 08/29/2018 FINAL  Final  Culture, respiratory (non-expectorated)     Status: None   Collection Time: 08/26/18  9:04 AM  Result Value Ref Range Status   Specimen Description TRACHEAL ASPIRATE  Final   Special Requests Normal  Final   Gram Stain   Final    FEW WBC PRESENT, PREDOMINANTLY PMN FEW SQUAMOUS EPITHELIAL CELLS PRESENT FEW BUDDING YEAST SEEN Performed at [facility], [place] [address] [place], [place] [address]    Culture FEW CANDIDA ALBICANS RARE STAPHYLOCOCCUS AUREUS   Final   Report Status 08/29/2018 FINAL  Final   Organism ID, Bacteria STAPHYLOCOCCUS AUREUS  Final      Susceptibility   Staphylococcus aureus - MIC*    CIPROFLOXACIN <=0.5 SENSITIVE Sensitive     ERYTHROMYCIN <=0.25 SENSITIVE Sensitive     GENTAMICIN <=0.5 SENSITIVE Sensitive     OXACILLIN 0.5 SENSITIVE Sensitive     TETRACYCLINE <=1 SENSITIVE Sensitive     VANCOMYCIN 1 SENSITIVE Sensitive     TRIMETH/SULFA <=10 SENSITIVE Sensitive     CLINDAMYCIN <=0.25 SENSITIVE Sensitive     RIFAMPIN <=0.5 SENSITIVE Sensitive     Inducible Clindamycin NEGATIVE Sensitive     * RARE STAPHYLOCOCCUS AUREUS         Radiology Studies: No results found.      Scheduled Meds: . amLODipine  5 mg Oral Daily  . Chlorhexidine Gluconate Cloth  6 each Topical Q0600  . feeding supplement (GLUCERNA SHAKE)  237 mL Oral TID BM  . fluconazole  200 mg Oral Daily  . gabapentin  400 mg Oral TID  . insulin aspart  1-5 Units Subcutaneous TID AC & HS  . insulin aspart  4 Units Subcutaneous TID  WC  . insulin glargine  18 Units Subcutaneous QHS  . multivitamin  with minerals  1 tablet Oral Daily  . pantoprazole sodium  40 mg Oral BID  . sucralfate  2 g Oral TID WC & HS   Continuous Infusions:   LOS: 9 days    Time spent: 30 minutes    Pratik Darleen Crocker, DO [name] Pager [phone]  If 7PM-7AM, please contact night-coverage [email or web address] Password [facility] 09/01/2018, 8:32 AM

## 2018-09-02 DIAGNOSIS — E1110 Type 2 diabetes mellitus with ketoacidosis without coma: Secondary | ICD-10-CM

## 2018-09-02 LAB — BASIC METABOLIC PANEL
Anion gap: 8 (ref 5–15)
BUN: 15 mg/dL (ref 8–23)
CO2: 26 mmol/L (ref 22–32)
Calcium: 7.9 mg/dL — ABNORMAL LOW (ref 8.9–10.3)
Chloride: 105 mmol/L (ref 98–111)
Creatinine, Ser: 1.31 mg/dL — ABNORMAL HIGH (ref 0.61–1.24)
GFR calc Af Amer: >60 mL/min (ref >60)
GFR calc non Af Amer: 58 mL/min — ABNORMAL LOW (ref >60)
Glucose, Bld: 50 mg/dL — ABNORMAL LOW (ref 70–99)
Potassium: 3.7 mmol/L (ref 3.5–5.1)
Sodium: 139 mmol/L (ref 135–145)

## 2018-09-02 LAB — CBC
HCT: 23.6 % — ABNORMAL LOW (ref 39.0–52.0)
Hemoglobin: 7.8 g/dL — ABNORMAL LOW (ref 13.0–17.0)
MCH: 29.3 pg (ref 26.0–34.0)
MCHC: 33.1 g/dL (ref 30.0–36.0)
MCV: 88.7 fL (ref 80.0–100.0)
Platelets: 430 10*3/uL — ABNORMAL HIGH (ref 150–400)
RBC: 2.66 MIL/uL — ABNORMAL LOW (ref 4.22–5.81)
RDW: 14.3 % (ref 11.5–15.5)
WBC: 10.8 10*3/uL — ABNORMAL HIGH (ref 4.0–10.5)
nRBC: 0 % (ref 0.0–0.2)

## 2018-09-02 LAB — GLUCOSE, CAPILLARY
Glucose-Capillary: 43 mg/dL — CL (ref 70–99)
Glucose-Capillary: 122 mg/dL — ABNORMAL HIGH (ref 70–99)
Glucose-Capillary: 72 mg/dL (ref 70–99)
Glucose-Capillary: 44 mg/dL — CL (ref 70–99)
Glucose-Capillary: 107 mg/dL — ABNORMAL HIGH (ref 70–99)
Glucose-Capillary: 139 mg/dL — ABNORMAL HIGH (ref 70–99)
Glucose-Capillary: 250 mg/dL — ABNORMAL HIGH (ref 70–99)

## 2018-09-02 MED ORDER — PRO-STAT SUGAR FREE PO LIQD
30.0000 mL | Freq: Three times a day (TID) | ORAL | Status: DC
  Administered 2018-09-03 – 2018-09-04 (×2): 30 mL via ORAL
  Filled 2018-09-02 (×4): qty 30

## 2018-09-02 MED ORDER — INSULIN GLARGINE 100 UNIT/ML ~~LOC~~ SOLN
10.0000 [IU] | Freq: Every day | SUBCUTANEOUS | Status: DC
  Administered 2018-09-02 – 2018-09-03 (×2): 10 [IU] via SUBCUTANEOUS
  Filled 2018-09-02 (×3): qty 0.1

## 2018-09-02 MED ORDER — DEXTROSE 50 % IV SOLN
INTRAVENOUS | Status: AC
  Administered 2018-09-02: 06:00:00 25 g via INTRAVENOUS
  Filled 2018-09-02: qty 50

## 2018-09-02 MED ORDER — DEXTROSE 50 % IV SOLN
25.0000 g | INTRAVENOUS | Status: AC
  Administered 2018-09-02: 06:00:00 25 g via INTRAVENOUS

## 2018-09-02 MED ORDER — DEXTROSE 50 % IV SOLN
INTRAVENOUS | Status: AC
  Administered 2018-09-02: 50 mL
  Filled 2018-09-02: qty 50

## 2018-09-02 NOTE — Progress Notes (Signed)
Hypoglycemic Event  CBG: 44  Treatment: Given D50 4mL  Symptoms: Increased confusion  Follow-up CBG: Time: 1307 CBG Result: 107  Possible Reasons for Event: Poor PO intake  Comments/MD notified: N/A    [name]

## 2018-09-02 NOTE — Progress Notes (Signed)
Physical Therapy Treatment Patient Details Name: [name] MRN: [id number] DOB: [birth year] Today's Date: 09/02/2018    History of Present Illness 63 y.o. male admitted on 08/23/18 for severe diabetic ketoacidosis hypothermia, AMS, hypoxia, elevated amonia, lactic acidosis, hyperkalemia, AKI, UGIB, and aspiration PNA. (+) cocaine and marijuana.  Intubated in the ED on 5/10 extubated 08/27/18.    Pt s/p EGD on 08/25/18.  Pt with other significant PMH of diabetic retinopathy, HTN, DM 1, peripheral vascular balloon angio, R toe amputation.     PT Comments    Patient seen for mobility progression. Pt continues to demonstrate generalized weakness and decreased activity tolerance. Pt initially was pleasant and then became agitated and remained agitated during session.  Pt follows commands inconsistently this session. Pt requires mod/max A (+2 for safety) with short distance gait training using RW. Pt ambulated to bathroom in room and then to sink. Once pt got the sink his bilat LE began to buckle and required max/total A to maintain standing while chair brought up behind pt. Continue to recommend SNF for further skilled PT services to maximize independence and safety with mobility.    Follow Up Recommendations  SNF     Equipment Recommendations  Rolling walker with 5" wheels    Recommendations for Other Services       Precautions / Restrictions Precautions Precautions: Fall Precaution Comments: last admission note had him using a RW, very weak    Mobility  Bed Mobility Overal bed mobility: Needs Assistance Bed Mobility: Supine to Sit     Supine to sit: HOB elevated;Supervision     General bed mobility comments: use of rail and HOB elevated; supervision for safety  Transfers Overall transfer level: Needs assistance Equipment used: Rolling walker (2 wheeled) Transfers: Sit to/from Stand Sit to Stand: Mod assist         General transfer comment: assist to stand from EOB and  commode with use of grab bar and then from chair at sink; cues for safe hand placement   Ambulation/Gait Ambulation/Gait assistance: +2 safety/equipment;Mod assist;Max assist(chair brought behind) Gait Distance (Feet): (10 ft then 6 ft) Assistive device: Rolling walker (2 wheeled) Gait Pattern/deviations: Decreased stride length;Step-to pattern;Step-through pattern;Drifts right/left;Narrow base of support Gait velocity: decreased   General Gait Details: bilat LE weakness noted adn pt requires assistance for balance and guiding RW with several LOB during short distance gait; pt ambulated to bathroom and the from bathroom to sink; pt had difficulty following directional cues to the sink and kept ambulating forward despite cues to turn around  to L; once in front of sink pt states "I don't think i'm gonna make it" and max A rquired to maintain standing; pt states "stop pushing on me I want to stand up!" however therapist keeping pt in standing; chair brought up behind pt and pt seated safely in chair   Stairs             Wheelchair Mobility    Modified Rankin (Stroke Patients Only)       Balance Overall balance assessment: Needs assistance Sitting-balance support: Feet supported;No upper extremity supported Sitting balance-Leahy Scale: Good     Standing balance support: Bilateral upper extremity supported;During functional activity Standing balance-Leahy Scale: Poor Standing balance comment: requires bilat UE on RW and external support                            Cognition Arousal/Alertness: Awake/alert Behavior During Therapy: [facility] for  tasks assessed/performed;Impulsive Overall Cognitive Status: No family/caregiver present to determine baseline cognitive functioning Area of Impairment: Following commands;Safety/judgement;Problem solving;Memory                     Memory: Decreased short-term memory Following Commands: Follows one step commands  inconsistently;Follows one step commands with increased time Safety/Judgement: Decreased awareness of safety;Decreased awareness of deficits   Problem Solving: Difficulty sequencing;Requires verbal cues;Decreased initiation General Comments: pt pleasant upon arrival and then quickly became agitated and upset saying that he had not received food all day however nursing staff remined pt that he had breakfast and lunch trays however he chose not to eat very much; pt believes they are lying; pt remained agitated throughout session and following commands inconsistently      Exercises      General Comments General comments (skin integrity, edema, etc.): pt reports dizziness in sitting and standing but worse when standing; BP taken after seated in chair and 127/53      Pertinent Vitals/Pain Pain Assessment: No/denies pain    Home Living                      Prior Function            PT Goals (current goals can now be found in the care plan section) Progress towards PT goals: Progressing toward goals    Frequency    Min 3X/week      PT Plan Current plan remains appropriate    Co-evaluation              AM-PAC PT "6 Clicks" Mobility   Outcome Measure  Help needed turning from your back to your side while in a flat bed without using bedrails?: A Little Help needed moving from lying on your back to sitting on the side of a flat bed without using bedrails?: A Little Help needed moving to and from a bed to a chair (including a wheelchair)?: A Little Help needed standing up from a chair using your arms (e.g., wheelchair or bedside chair)?: A Little Help needed to walk in hospital room?: A Lot Help needed climbing 3-5 steps with a railing? : A Lot 6 Click Score: 16    End of Session Equipment Utilized During Treatment: Gait belt Activity Tolerance: Patient tolerated treatment well;Patient limited by fatigue;Other (comment)(dizziness) Patient left: in chair;with  call bell/phone within reach;with chair alarm set Nurse Communication: Mobility status PT Visit Diagnosis: Muscle weakness (generalized) (M62.81);Difficulty in walking, not elsewhere classified (R26.2)     Time: 0630-1601 PT Time Calculation (min) (ACUTE ONLY): 32 min  Charges:  $Gait Training: 23-37 mins                     Earney Navy, PTA Acute Rehabilitation Services Pager: [phone] Office: [phone]     Darliss Cheney 09/02/2018, 3:46 PM

## 2018-09-02 NOTE — Progress Notes (Signed)
Nutrition Follow-up  DOCUMENTATION CODES:   Not applicable  INTERVENTION:   -Continue MVI with minerals daily -D/c Glucerna Shake po TID, each supplement provides 220 kcal and 10 grams of protein, due to poor acceptance -30 ml Prostat TID, each supplement provides 100 kcals and 15 grams protein -Magic Cup TID with meals, each supplement provides 290 kcals and 9 grams protein  NUTRITION DIAGNOSIS:   Inadequate oral intake related to decreased appetite(esophagitis) as evidenced by per patient/family report.  Ongoing  GOAL:   Patient will meet greater than or equal to 90% of their needs  Progressing  MONITOR:   PO intake, Supplement acceptance, Diet advancement, Labs, Weight trends, Skin, I & O's  REASON FOR ASSESSMENT:   Ventilator    ASSESSMENT:   63 yo male with PMH of type 1 DM, hepatitis C, drug abuse, diabetic retinopathy, R great toe amputation, HTN, vitiligo. Admitted with hyperglycemia (glucose 2500), DKA, hypothermia, and gastritis. Tox screens positive for cocaine and marijuana.   5/14- extubated; s/p BSE- recommend NPO; s/p MBSS- advanced to dysphagia 3 diet with thin liquids, transferred from ICU to PCU 5/18- GI added diflucan for for esophageal candidiasis (14 day couse)  Reviewed I/O's: -77 ml x 24 hours and +9.8 L since admission  UOP: 250 ml x 24 hours  Per SLP notes, pt refusing thickened liquids.   Pt experienced hypoglycemia episode this AM. He has had decreased oral intake (PO: 10-60%). He is refusing Glucerna supplements.   CSW assisting with SNF placement  Labs reviewed: CBGS: 43-153 (inpatient orders for glycemic control are 1-5 units insulin aspart TID and q HS and 10 units insulin glargine daily).   Diet Order:   Diet Order            DIET DYS 3 Room service appropriate? Yes with Assist; Fluid consistency: Thin  Diet effective now              EDUCATION NEEDS:   No education needs have been identified at this time  Skin:  Skin  Assessment: Reviewed RN Assessment  Last BM:  08/29/18  Height:   Ht Readings from Last 1 Encounters:  08/28/18 6' (1.829 m)    Weight:   Wt Readings from Last 1 Encounters:  09/01/18 73.8 kg    Ideal Body Weight:  80.9 kg  BMI:  Body mass index is 22.07 kg/m.  Estimated Nutritional Needs:   Kcal:  2050-2250  Protein:  90-110 gm  Fluid:  2.2 L    Vicie Cech A. Jimmye Norman, RD, LDN, [place] Registered Dietitian II Certified Diabetes Care and Education Specialist Pager: [phone] After hours Pager: [phone]

## 2018-09-02 NOTE — Significant Event (Signed)
Hypoglycemic Event  CBG: 43  Treatment: D50 50 mL (25 gm)  Symptoms: Confused, not following directions appropriately  Follow-up CBG: Time:0545 CBG Result:122  Possible Reasons for Event: Medication regimen: insulins  Comments/MD notified: Initiated hypoglycemia protocol and administered 25g of D50. Notified [place], NP    Benson Norway

## 2018-09-02 NOTE — Progress Notes (Signed)
Inpatient Diabetes Program Recommendations  AACE/ADA: New Consensus Statement on Inpatient Glycemic Control (2015)  Target Ranges:  Prepandial:   less than 140 mg/dL      Peak postprandial:   less than 180 mg/dL (1-2 hours)      Critically ill patients:  140 - 180 mg/dL   Lab Results  Component Value Date   GLUCAP 72 09/02/2018   HGBA1C 9.8 (H) 08/13/2018    Review of Glycemic Control Results for DONNE, BALEY (MRN [id number]) as of 09/02/2018 09:54  Ref. Range 09/01/2018 08:16 09/01/2018 12:24 09/01/2018 17:39 09/01/2018 21:24 09/02/2018 05:26 09/02/2018 05:45  Glucose-Capillary Latest Ref Range: 70 - 99 mg/dL 87 116 (H) 154 (H) 156 (H) 43 (LL) 122 (H)   Diabetes history: DM 1 Outpatient Diabetes medications:  70/30 15 units am + 10 units pm Current orders for Inpatient glycemic control:  Lantus 18 units q HS, Novolog 1-5 units tid with meals  Inpatient Diabetes Program Recommendations:   Please decrease Lantus to 12 units q HS.   Thanks,  Adah Perl, RN, BC-ADM Inpatient Diabetes Coordinator Pager [phone] (8a-5p)

## 2018-09-02 NOTE — Progress Notes (Addendum)
  Speech Language Pathology Treatment: Dysphagia  Patient Details Name: [name] MRN: [id number] DOB: March 31, 1956 Today's Date: 09/02/2018 Time: 1594-5859 SLP Time Calculation (min) (ACUTE ONLY): 12 min  Assessment / Plan / Recommendation Clinical Impression  Pt with low CBG this am - remained quite confused during session, hallucinating - seeing bugs on the walls, falling asleep easily and frequently.  Pt demonstrates ongoing, intermittent coughing associated with PO intake. CXR 5/19 shows left lower lobe airspace consolidation with small left pleural effusion, slightly increased from most recent study. Right lung clear. Diflucan started 5/18 per GI for total of 14-days.  Pt reports fewer incidents of regurgitation and improved comfort when eating.  MBS reviewed.  Continue current diet, taking small sips from cup per MBS and following basic precautions.  Suspect esophagitis remains primary influence re: overall clinical presentation with swallowing.  SLP will continue to follow.     HPI HPI: 63 y.o. male admitted on 08/23/18 for severe diabetic ketoacidosis hypothermia, AMS, hypoxia, elevated amonia, lactic acidosis, hyperkalemia, AKI, UGIB, and aspiration PNA. (+) cocaine and marijuana.  Intubated in the ED on 5/10 extubated 08/27/18.    Pt s/p EGD on 5/12 showing severe esophagitis.  Pt with other significant PMH of diabetic retinopathy, HTN, DM 1, peripheral vascular balloon angio, R toe amputation. MBS in 2019 showed intermittent aspiration of thin liquids with variable sensation.      SLP Plan  Continue with current plan of care       Recommendations  Diet recommendations: Dysphagia 3 (mechanical soft);Thin liquid Liquids provided via: Cup Medication Administration: Whole meds with puree Supervision: Patient able to self feed Compensations: Slow rate;Small sips/bites;Clear throat intermittently Postural Changes and/or Swallow Maneuvers: Out of bed for meals;Seated upright 90 degrees               Oral Care Recommendations: Oral care BID SLP Visit Diagnosis: Dysphagia, oropharyngeal phase (R13.12) Plan: Continue with current plan of care       GO                [name] 09/02/2018, 10:18 AM  [name], [name] Office number [phone] Pager [phone]

## 2018-09-02 NOTE — Progress Notes (Signed)
Patient ID: [name], male   DOB: [birth year], 63 y.o.   MRN: [id number]  PROGRESS NOTE    [name]  HYW:[id number] DOB: 08-16-1955 DOA: 08/23/2018 PCP: Patient, No Pcp Per   Brief Narrative:  64 year old male with history of diabetes mellitus type 2, hypertension, cocaine/marijuana use presented to the hospital with acute metabolic encephalopathy in the setting of DKA/AKI and was found to have septic shock from pneumonia.  He was admitted to the ICU by PCCM and required intubation.  He also developed acute blood loss anemia from GI bleeding.  He underwent EGD on 08/25/2018.  He was subsequently extubated on 08/27/2018 and was transferred to [facility] on 08/29/2018.  He is currently awaiting transfer to SNF.  Assessment & Plan:   Active Problems:   DKA (diabetic ketoacidoses) ([place])  Acute metabolic encephalopathy -Most likely from DKA/septic shock/respiratory failure/AKI -Resolved -Alert and awake currently but a poor historian -Monitor mental status.  Fall precautions  Acute hypoxic respiratory failure Aspiration pneumonia -Intubated on admission and extubated on 08/27/2018. -Currently on room air -Has completed antibiotic course with Unasyn on 08/29/2018. -Sputum culture had shown rare Staphylococcus aureus and few Candida albicans. -COVID-19 testing was negative -Blood cultures have been negative so far  Upper GI bleeding Acute blood loss anemia -Underwent EGD on 08/25/2018 which showed severe esophagitis along with nonbleeding gastric ulcers. -Continue PPI twice a day and Carafate -Hemoglobin 7.8.  Transfuse if hemoglobin is less than 7. -GI is following peripherally and has started Diflucan for possible Candida esophagitis on 08/31/2018 -Outpatient follow-up with GI.  Hypertension -Blood pressure stable.  Continue amlodipine  Diabetes mellitus type 2 DKA: Present on admission.  Resolved Hypoglycemia -Patient was hypoglycemic this morning and confused.  DC NovoLog with meals.   Decrease Lantus to 10 units at bedtime.  Continue CBGs with SSI  Acute kidney injury -Resolved.  Monitor  Peripheral neuropathy next-continue Neurontin  Cocaine/marijuana use -Patient was counseled regarding the same during this hospitalization  Generalized deconditioning -Patient is very deconditioned and will benefit from SNF on discharge continue ongoing physical therapy.  Social worker following  Dysphagia -From acute illness/debility and esophagitis -Diet as per SLP recommendations -Continue empiric Diflucan as per GI for total of 14-day course which was started on 08/31/2018    DVT prophylaxis: SCDs Code Status: Full Family Communication: None at bedside Disposition Plan: SNF once bed is available  Consultants: PCCM/GI  Procedures: EGD on 08/25/2018: Severe esophagitis with nonbleeding gastric ulcers Intubation on 08/23/2018/extubation on 08/27/2018 Right IJ central line 08/23/2018-08/28/2018  Antimicrobials:  Anti-infectives (From admission, onward)   Start     Dose/Rate Route Frequency Ordered Stop   09/01/18 1700  piperacillin-tazobactam (ZOSYN) IVPB 3.375 g     3.375 g 12.5 mL/hr over 240 Minutes Intravenous Every 8 hours 09/01/18 1607     08/31/18 1700  fluconazole (DIFLUCAN) tablet 200 mg     200 mg Oral Daily 08/31/18 1646 09/14/18 0959   08/27/18 1200  Ampicillin-Sulbactam (UNASYN) 3 g in sodium chloride 0.9 % 100 mL IVPB     3 g 200 mL/hr over 30 Minutes Intravenous Every 6 hours 08/27/18 1033 08/29/18 1729   08/27/18 1152  Ampicillin-Sulbactam (UNASYN) 3 g in sodium chloride 0.9 % 100 mL IVPB  Status:  Discontinued     3 g 200 mL/hr over 30 Minutes Intravenous Every 6 hours 08/27/18 1032 08/27/18 1033   08/26/18 0600  Ampicillin-Sulbactam (UNASYN) 3 g in sodium chloride 0.9 % 100 mL IVPB  Status:  Discontinued     3 g 200 mL/hr over 30 Minutes Intravenous Every 8 hours 08/26/18 0052 08/27/18 1032   08/23/18 1700  Ampicillin-Sulbactam (UNASYN) 3 g in sodium  chloride 0.9 % 100 mL IVPB  Status:  Discontinued     3 g 200 mL/hr over 30 Minutes Intravenous Every 12 hours 08/23/18 1657 08/26/18 0052       Subjective: Patient seen and examined at bedside.  He is awake, slow to respond to questions.  No overnight fever or vomiting reported.  He apparently was confused this morning with low blood sugar.  Objective: Vitals:   09/01/18 2010 09/01/18 2036 09/02/18 0550 09/02/18 0600  BP: (!) 118/53  (!) 127/51   Pulse: 84  86   Resp: 16  16   Temp: 98 F (36.7 C)  98.6 F (37 C)   TempSrc: Oral  Oral   SpO2: (!) 88% 94% (!) 87% 90%  Weight:      Height:        Intake/Output Summary (Last 24 hours) at 09/02/2018 0954 Last data filed at 09/02/2018 0353 Gross per 24 hour  Intake 52.56 ml  Output 150 ml  Net -97.44 ml   Filed Weights   08/31/18 0307 08/31/18 0610 09/01/18 0419  Weight: 75.5 kg 76.9 kg 73.8 kg    Examination:  General exam: Appears calm and comfortable.  No distress.  Awake, answers to questions. poor historian Respiratory system: Bilateral decreased breath sounds at bases, no wheezing Cardiovascular system: S1 & S2 heard, Rate controlled Gastrointestinal system: Abdomen is nondistended, soft and nontender. Normal bowel sounds heard. Extremities: No cyanosis, clubbing, edema     Data Reviewed: I have personally reviewed following labs and imaging studies  CBC: Recent Labs  Lab 08/27/18 0430 08/28/18 0443 08/29/18 0307 08/31/18 0246 09/01/18 0246 09/02/18 0354  WBC 13.5* 9.2 8.9 12.5* 11.1* 10.8*  NEUTROABS 10.9*  --   --   --   --   --   HGB 8.7* 8.9* 8.5* 8.8* 7.8* 7.8*  HCT 26.7* 27.3* 26.0* 27.1* 23.8* 23.6*  MCV 90.2 89.8 87.5 88.9 88.5 88.7  PLT 107* 148* 191 339 380 235*   Basic Metabolic Panel: Recent Labs  Lab 08/27/18 0430  08/29/18 0307 08/30/18 0804 08/31/18 0246 09/01/18 0246 09/02/18 0354  NA 150*   < > 141 142 140 137 139  K 3.4*   < > 3.1* 3.8 3.9 3.6 3.7  CL 115*   < > 113* 112*  109 106 105  CO2 25   < > 22 24 25 25 26   GLUCOSE 225*   < > 298* 226* 226* 154* 50*  BUN 26*   < > 22 17 17 16 15   CREATININE 1.61*   < > 1.36* 1.34* 1.18 1.24 1.31*  CALCIUM 7.9*   < > 7.5* 7.8* 7.9* 7.8* 7.9*  MG 2.2  --   --  1.8  --   --   --   PHOS 2.3*  --   --   --   --   --   --    < > = values in this interval not displayed.   GFR: Estimated Creatinine Clearance: 61.8 mL/min (A) (by C-G formula based on SCr of 1.31 mg/dL (H)). Liver Function Tests: No results for input(s): AST, ALT, ALKPHOS, BILITOT, PROT, ALBUMIN in the last 168 hours. No results for input(s): LIPASE, AMYLASE in the last 168 hours. No results for input(s): AMMONIA in the last 168 hours.  Coagulation Profile: No results for input(s): INR, PROTIME in the last 168 hours. Cardiac Enzymes: No results for input(s): CKTOTAL, CKMB, CKMBINDEX, TROPONINI in the last 168 hours. BNP (last 3 results) No results for input(s): PROBNP in the last 8760 hours. HbA1C: No results for input(s): HGBA1C in the last 72 hours. CBG: Recent Labs  Lab 09/01/18 1739 09/01/18 2124 09/02/18 0526 09/02/18 0545 09/02/18 0750  GLUCAP 154* 156* 43* 122* 72   Lipid Profile: No results for input(s): CHOL, HDL, LDLCALC, TRIG, CHOLHDL, LDLDIRECT in the last 72 hours. Thyroid Function Tests: No results for input(s): TSH, T4TOTAL, FREET4, T3FREE, THYROIDAB in the last 72 hours. Anemia Panel: No results for input(s): VITAMINB12, FOLATE, FERRITIN, TIBC, IRON, RETICCTPCT in the last 72 hours. Sepsis Labs: No results for input(s): PROCALCITON, LATICACIDVEN in the last 168 hours.  Recent Results (from the past 240 hour(s))  SARS Coronavirus 2 [facility] order, Performed in [place] hospital lab)     Status: None   Collection Time: 08/23/18 10:55 AM  Result Value Ref Range Status   SARS Coronavirus 2 NEGATIVE NEGATIVE Final    Comment: (NOTE) If result is NEGATIVE SARS-CoV-2 target nucleic acids are NOT DETECTED. The SARS-CoV-2 RNA  is generally detectable in upper and lower  respiratory specimens during the acute phase of infection. The lowest  concentration of SARS-CoV-2 viral copies this assay can detect is 250  copies / mL. A negative result does not preclude SARS-CoV-2 infection  and should not be used as the sole basis for treatment or other  patient management decisions.  A negative result may occur with  improper specimen collection / handling, submission of specimen other  than nasopharyngeal swab, presence of viral mutation(s) within the  areas targeted by this assay, and inadequate number of viral copies  (<250 copies / mL). A negative result must be combined with clinical  observations, patient history, and epidemiological information. If result is POSITIVE SARS-CoV-2 target nucleic acids are DETECTED. The SARS-CoV-2 RNA is generally detectable in upper and lower  respiratory specimens dur ing the acute phase of infection.  Positive  results are indicative of active infection with SARS-CoV-2.  Clinical  correlation with patient history and other diagnostic information is  necessary to determine patient infection status.  Positive results do  not rule out bacterial infection or co-infection with other viruses. If result is PRESUMPTIVE POSTIVE SARS-CoV-2 nucleic acids MAY BE PRESENT.   A presumptive positive result was obtained on the submitted specimen  and confirmed on repeat testing.  While 2019 novel coronavirus  (SARS-CoV-2) nucleic acids may be present in the submitted sample  additional confirmatory testing may be necessary for epidemiological  and / or clinical management purposes  to differentiate between  SARS-CoV-2 and other Sarbecovirus currently known to infect humans.  If clinically indicated additional testing with an alternate test  methodology [phone]) is advised. The SARS-CoV-2 RNA is generally  detectable in upper and lower respiratory sp ecimens during the acute  phase of infection.  The expected result is Negative. Fact Sheet for Patients:  StrictlyIdeas.no Fact Sheet for Healthcare Providers: BankingDealers.co.za This test is not yet approved or cleared by the [place] FDA and has been authorized for detection and/or diagnosis of SARS-CoV-2 by FDA under an Emergency Use Authorization (EUA).  This EUA will remain in effect (meaning this test can be used) for the duration of the COVID-19 declaration under Section 564(b)(1) of the Act, 21 U.S.C. section 360bbb-3(b)(1), unless the authorization is terminated or revoked sooner.  Performed at [facility], [place] [address], [place], [place] [address]   Culture, blood (Routine X 2) w Reflex to ID Panel     Status: None   Collection Time: 08/23/18 12:15 PM  Result Value Ref Range Status   Specimen Description BLOOD RIGHT ANTECUBITAL  Final   Special Requests   Final    BOTTLES DRAWN AEROBIC AND ANAEROBIC Blood Culture adequate volume   Culture   Final    NO GROWTH 5 DAYS Performed at [facility], [place] [address], [place], [place] [address]    Report Status 08/28/2018 FINAL  Final  MRSA PCR Screening     Status: None   Collection Time: 08/23/18  6:09 PM  Result Value Ref Range Status   MRSA by PCR NEGATIVE NEGATIVE Final    Comment:        The GeneXpert MRSA Assay (FDA approved for NASAL specimens only), is one component of a comprehensive MRSA colonization surveillance program. It is not intended to diagnose MRSA infection nor to guide or monitor treatment for MRSA infections. Performed at [facility], [place] [address], [place], [place] [address]   Culture, blood (Routine X 2) w Reflex to ID Panel     Status: None   Collection Time: 08/24/18  3:08 AM  Result Value Ref Range Status   Specimen Description BLOOD LEFT HAND  Final   Special Requests   Final    BOTTLES DRAWN AEROBIC ONLY Blood Culture adequate volume   Culture   Final    NO  GROWTH 5 DAYS Performed at [facility], [place] [address], [place], [place] [address]    Report Status 08/29/2018 FINAL  Final  Culture, respiratory (non-expectorated)     Status: None   Collection Time: 08/26/18  9:04 AM  Result Value Ref Range Status   Specimen Description TRACHEAL ASPIRATE  Final   Special Requests Normal  Final   Gram Stain   Final    FEW WBC PRESENT, PREDOMINANTLY PMN FEW SQUAMOUS EPITHELIAL CELLS PRESENT FEW BUDDING YEAST SEEN Performed at [facility], [place] [address] [place], [place] [address]    Culture FEW CANDIDA ALBICANS RARE STAPHYLOCOCCUS AUREUS   Final   Report Status 08/29/2018 FINAL  Final   Organism ID, Bacteria STAPHYLOCOCCUS AUREUS  Final      Susceptibility   Staphylococcus aureus - MIC*    CIPROFLOXACIN <=0.5 SENSITIVE Sensitive     ERYTHROMYCIN <=0.25 SENSITIVE Sensitive     GENTAMICIN <=0.5 SENSITIVE Sensitive     OXACILLIN 0.5 SENSITIVE Sensitive     TETRACYCLINE <=1 SENSITIVE Sensitive     VANCOMYCIN 1 SENSITIVE Sensitive     TRIMETH/SULFA <=10 SENSITIVE Sensitive     CLINDAMYCIN <=0.25 SENSITIVE Sensitive     RIFAMPIN <=0.5 SENSITIVE Sensitive     Inducible Clindamycin NEGATIVE Sensitive     * RARE STAPHYLOCOCCUS AUREUS         Radiology Studies: Dg Chest Port 1 View  Result Date: 09/01/2018 CLINICAL DATA:  Shortness of breath EXAM: PORTABLE CHEST 1 VIEW COMPARISON:  Aug 27, 2018 FINDINGS: There is airspace consolidation in the left lower lobe with small left pleural effusion, slightly increased. Right lung remains clear. Heart size and pulmonary vascular normal. No adenopathy. There is right carotid artery calcification. No bone lesions. IMPRESSION: Left lower lobe airspace consolidation with small left pleural effusion, slightly increased from most recent study. Right lung clear. Stable cardiac silhouette. There is right carotid  artery calcification. Electronically Signed   By: Lowella Grip III M.D.   On:  09/01/2018 15:13        Scheduled Meds: . amLODipine  5 mg Oral Daily  . Chlorhexidine Gluconate Cloth  6 each Topical Q0600  . feeding supplement (GLUCERNA SHAKE)  237 mL Oral TID BM  . fluconazole  200 mg Oral Daily  . gabapentin  400 mg Oral TID  . insulin aspart  1-5 Units Subcutaneous TID AC & HS  . insulin glargine  18 Units Subcutaneous QHS  . multivitamin with minerals  1 tablet Oral Daily  . pantoprazole sodium  40 mg Oral BID  . sucralfate  2 g Oral TID WC & HS   Continuous Infusions: . piperacillin-tazobactam (ZOSYN)  IV 3.375 g (09/02/18 0854)     LOS: 10 days        Aline August, MD [name] 09/02/2018, 9:54 AM

## 2018-09-02 NOTE — TOC Progression Note (Signed)
Transition of Care [facility]) - Progression Note    Patient Details  Name: LAMIR RACCA MRN: [id number] Date of Birth: [birth year]  Transition of Care [facility]) CM/SW [place], LCSW Phone Number: 09/02/2018, 2:34 PM  Clinical Narrative:    [place] staffed case with CSW AD. Due to patient history with behaviors at SNF, currently no bed offers exist. Staff please continue to mobilize patient to see if he can return home safely with family.    Expected Discharge Plan: Skilled Nursing Facility Barriers to Discharge: Insurance Authorization  Expected Discharge Plan and Services Expected Discharge Plan: [place]       Living arrangements for the past 2 months: Single Family Home Expected Discharge Date: 08/31/18                                     Social Determinants of Health (SDOH) Interventions    Readmission Risk Interventions Readmission Risk Prevention Plan 08/12/2018  Transportation Screening Complete  Medication Review [employer]) Complete  PCP or Specialist appointment within 3-5 days of discharge Complete  HRI or [place] Complete  SW Recovery Care/Counseling Consult Complete  [place] Not Applicable  Some recent data might be hidden

## 2018-09-03 DIAGNOSIS — R443 Hallucinations, unspecified: Secondary | ICD-10-CM

## 2018-09-03 DIAGNOSIS — G9341 Metabolic encephalopathy: Secondary | ICD-10-CM

## 2018-09-03 DIAGNOSIS — F29 Unspecified psychosis not due to a substance or known physiological condition: Secondary | ICD-10-CM

## 2018-09-03 LAB — BASIC METABOLIC PANEL
Anion gap: 6 (ref 5–15)
BUN: 14 mg/dL (ref 8–23)
CO2: 28 mmol/L (ref 22–32)
Calcium: 7.7 mg/dL — ABNORMAL LOW (ref 8.9–10.3)
Chloride: 104 mmol/L (ref 98–111)
Creatinine, Ser: 1.36 mg/dL — ABNORMAL HIGH (ref 0.61–1.24)
GFR calc Af Amer: >60 mL/min (ref >60)
GFR calc non Af Amer: 56 mL/min — ABNORMAL LOW (ref >60)
Glucose, Bld: 159 mg/dL — ABNORMAL HIGH (ref 70–99)
Potassium: 3.8 mmol/L (ref 3.5–5.1)
Sodium: 138 mmol/L (ref 135–145)

## 2018-09-03 LAB — CBC WITH DIFFERENTIAL/PLATELET
Abs Immature Granulocytes: 0.13 10*3/uL — ABNORMAL HIGH (ref 0.00–0.07)
Basophils Absolute: 0 10*3/uL (ref 0.0–0.1)
Basophils Relative: 0 %
Eosinophils Absolute: 0.2 10*3/uL (ref 0.0–0.5)
Eosinophils Relative: 2 %
HCT: 22.8 % — ABNORMAL LOW (ref 39.0–52.0)
Hemoglobin: 7.4 g/dL — ABNORMAL LOW (ref 13.0–17.0)
Immature Granulocytes: 1 %
Lymphocytes Relative: 18 %
Lymphs Abs: 1.7 10*3/uL (ref 0.7–4.0)
MCH: 29 pg (ref 26.0–34.0)
MCHC: 32.5 g/dL (ref 30.0–36.0)
MCV: 89.4 fL (ref 80.0–100.0)
Monocytes Absolute: 0.9 10*3/uL (ref 0.1–1.0)
Monocytes Relative: 10 %
Neutro Abs: 6.6 10*3/uL (ref 1.7–7.7)
Neutrophils Relative %: 69 %
Platelets: 485 10*3/uL — ABNORMAL HIGH (ref 150–400)
RBC: 2.55 MIL/uL — ABNORMAL LOW (ref 4.22–5.81)
RDW: 14.4 % (ref 11.5–15.5)
WBC: 9.6 10*3/uL (ref 4.0–10.5)
nRBC: 0 % (ref 0.0–0.2)

## 2018-09-03 LAB — GLUCOSE, CAPILLARY
Glucose-Capillary: 101 mg/dL — ABNORMAL HIGH (ref 70–99)
Glucose-Capillary: 81 mg/dL (ref 70–99)
Glucose-Capillary: 223 mg/dL — ABNORMAL HIGH (ref 70–99)
Glucose-Capillary: 260 mg/dL — ABNORMAL HIGH (ref 70–99)

## 2018-09-03 LAB — MAGNESIUM: Magnesium: 1.8 mg/dL (ref 1.7–2.4)

## 2018-09-03 NOTE — Consult Note (Addendum)
Telepsych Consultation   Reason for Consult:  Hallucinations  Referring Physician:  Dr. Aline August  Location of Patient: [facility] Location of Provider: [facility]  Patient Identification: [name] MRN:  [id number] Principal Diagnosis: Hallucinations Diagnosis:  Principal Problem:   Hallucinations Active Problems:   DKA (diabetic ketoacidoses) ([place])   Total Time spent with patient: 1 hour  Subjective:   [name] is a 63 y.o. male patient admitted with AMS.  HPI:   Per chart review, patient was admitted with AMS in the setting of DKA, AKI and was found to have septic shock from pneumonia. He required intubation. He developed acute blood loss anemia from GI bleeding. He was extubated on 5/16. Psychiatry is consulted for hallucinations. He is awaiting placement in a SNF.   On interview, [name] complains of not having anything to eat for lunch. He does not remember that he was already served lunch. He reports that his appetite is "ravenous." He is oriented to year but believes the date is 4/18. He reports that he is unsure of his medical problems but he does mention that he has diabetes and retinopathy. He denies a history of mental health problems. He denies SI, HI or AVH. He denies problems with appetite and sleep.    Past Psychiatric History: Depression and substance abuse.   Risk to Self:  None. Denies SI.  Risk to Others:  None. Denies HI.  Prior Inpatient Therapy:  Denies  Prior Outpatient Therapy:  Denies   Past Medical History:  Past Medical History:  Diagnosis Date  . DEPRESSION   . DIABETES MELLITUS, TYPE I   . DRUG ABUSE    pt should have NO controlled substances rx'ed  . Glaucoma   . HEPATITIS C    chronic  . Hypertension 02/18/2011  . 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 Vitiligo     Past Surgical History:  Procedure Laterality Date  . ABDOMINAL AORTOGRAM W/LOWER EXTREMITY N/A 07/25/2016   Procedure: Abdominal Aortogram  w/Bilateral Lower Extremity Runoff;  Surgeon: Conrad Cresco, MD;  Location: [place] [facility];  Service: Cardiovascular;  Laterality: N/A;  . ABDOMINAL AORTOGRAM W/LOWER EXTREMITY N/A 12/24/2016   Procedure: ABDOMINAL AORTOGRAM W/LOWER EXTREMITY;  Surgeon: Serafina Mitchell, MD;  Location: [place] [facility];  Service: Cardiovascular;  Laterality: N/A;  . AMPUTATION TOE Right 07/26/2016   Procedure: AMPUTATION GREAT TOE;  Surgeon: Serafina Mitchell, MD;  Location: [place];  Service: Vascular;  Laterality: Right;  . BIOPSY  08/25/2018   Procedure: BIOPSY;  Surgeon: Irving Copas., MD;  Location: [place];  Service: Gastroenterology;;  . ESOPHAGOGASTRODUODENOSCOPY N/A 08/25/2018   Procedure: ESOPHAGOGASTRODUODENOSCOPY (EGD);  Surgeon: Irving Copas., MD;  Location: [place];  Service: Gastroenterology;  Laterality: N/A;  . EYE SURGERY     retinal surgery x 2, right eye  . INSERTION OF ILIAC STENT Right 07/26/2016   Procedure: INSERTION OF RIGHT POPLITEAL STENT WITH BALLOON ANGIOPLASTY;  Surgeon: Serafina Mitchell, MD;  Location: [place];  Service: Vascular;  Laterality: Right;  . LOWER EXTREMITY ANGIOGRAM Right 07/26/2016   Procedure: LOWER EXTREMITY ANGIOGRAM;  Surgeon: Serafina Mitchell, MD;  Location: [place];  Service: Vascular;  Laterality: Right;  . PERIPHERAL VASCULAR BALLOON ANGIOPLASTY Right 07/26/2016   Procedure: PERIPHERAL VASCULAR BALLOON ANGIOPLASTY  RIGHT ANTERIOR TIBEAL ARTERY AND RIGHT SUPERFICIAL FEMORAL ARTERY;  Surgeon: Serafina Mitchell, MD;  Location: [place];  Service: Vascular;  Laterality: Right;  . PERIPHERAL VASCULAR INTERVENTION  Right 12/24/2016   Procedure: PERIPHERAL VASCULAR INTERVENTION;  Surgeon: Serafina Mitchell, MD;  Location: [place] [facility];  Service: Cardiovascular;  Laterality: Right;  lower extr   Family History:  Family History  Problem Relation Age of Onset  . Diabetes Father   . Kidney disease Father   . Arthritis Mother   . Heart disease Mother         CAD   Family Psychiatric  History: Mother-dementia  Social History:  Social History   Substance and Sexual Activity  Alcohol Use No  . Alcohol/week: 0.0 standard drinks   Comment: 08/14/14 none     Social History   Substance and Sexual Activity  Drug Use Yes  . Types: Marijuana, Heroin, Cocaine, "Crack" cocaine, Fentanyl, Methylphenidate   Comment: s/p rehab x 6, occasional drug use (per pt last  use was 06/2015)    Social History   Socioeconomic History  . Marital status: Single    Spouse name: Not on file  . Number of children: 2  . Years of education: 57  . Highest education level: Associate degree: occupational, Hotel manager, or vocational program  Occupational History    Employer: UNEMPLOYED    Comment: disabled  Social Needs  . Financial resource strain: Very hard  . Food insecurity:    Worry: Often true    Inability: Often true  . Transportation needs:    Medical: Yes    Non-medical: Yes  Tobacco Use  . Smoking status: Current Every Day Smoker    Packs/day: 1.00    Years: 51.00    Pack years: 51.00    Types: Cigarettes  . Smokeless tobacco: Former Systems developer    Types: Chew    Quit date: 1985  Substance and Sexual Activity  . Alcohol use: No    Alcohol/week: 0.0 standard drinks    Comment: 08/14/14 none  . Drug use: Yes    Types: Marijuana, Heroin, Cocaine, "Crack" cocaine, Fentanyl, Methylphenidate    Comment: s/p rehab x 6, occasional drug use (per pt last  use was 06/2015)  . Sexual activity: Not Currently  Lifestyle  . Physical activity:    Days per week: 0 days    Minutes per session: 0 min  . Stress: Very much  Relationships  . Social connections:    Talks on phone: Never    Gets together: Once a week    Attends religious service: Never    Active member of club or organization: No    Attends meetings of clubs or organizations: Not on file    Relationship status: Never married  Other Topics Concern  . Not on file  Social History Narrative    Lives in boarding house/homeless   Ongoing occ drug use-crack   Single, disabled, prev mental health drug counselor.    Additional Social History: He lives with his brother and his brother's family. He denies alcohol or illicit drug use. UDS was positive for cocaine and THC on 4/28.     Allergies:   Allergies  Allergen Reactions  . Codeine Itching    Tolerates hydrocodone/apap    Labs:  Results for orders placed or performed during the hospital encounter of 08/23/18 (from the past 48 hour(s))  Glucose, capillary     Status: Abnormal   Collection Time: 09/01/18  5:39 PM  Result Value Ref Range   Glucose-Capillary 154 (H) 70 - 99 mg/dL  Glucose, capillary     Status: Abnormal   Collection Time: 09/01/18  9:24 PM  Result Value Ref Range   Glucose-Capillary 156 (H) 70 - 99 mg/dL  Basic metabolic panel     Status: Abnormal   Collection Time: 09/02/18  3:54 AM  Result Value Ref Range   Sodium 139 135 - 145 mmol/L   Potassium 3.7 3.5 - 5.1 mmol/L   Chloride 105 98 - 111 mmol/L   CO2 26 22 - 32 mmol/L   Glucose, Bld 50 (L) 70 - 99 mg/dL   BUN 15 8 - 23 mg/dL   Creatinine, Ser 1.31 (H) 0.61 - 1.24 mg/dL   Calcium 7.9 (L) 8.9 - 10.3 mg/dL   GFR calc non Af Amer 58 (L) >60 mL/min   GFR calc Af Amer >60 >60 mL/min   Anion gap 8 5 - 15    Comment: Performed at [facility], [place] [address], [place], [place] [address]  CBC     Status: Abnormal   Collection Time: 09/02/18  3:54 AM  Result Value Ref Range   WBC 10.8 (H) 4.0 - 10.5 K/uL   RBC 2.66 (L) 4.22 - 5.81 MIL/uL   Hemoglobin 7.8 (L) 13.0 - 17.0 g/dL   HCT 23.6 (L) 39.0 - 52.0 %   MCV 88.7 80.0 - 100.0 fL   MCH 29.3 26.0 - 34.0 pg   MCHC 33.1 30.0 - 36.0 g/dL   RDW 14.3 11.5 - 15.5 %   Platelets 430 (H) 150 - 400 K/uL   nRBC 0.0 0.0 - 0.2 %    Comment: Performed at [facility], [place] [address] [place], [place] [address]  Glucose, capillary     Status: Abnormal   Collection Time: 09/02/18  5:26 AM  Result Value  Ref Range   Glucose-Capillary 43 (LL) 70 - 99 mg/dL   Comment 1 Notify RN    Comment 2 Document in Chart   Glucose, capillary     Status: Abnormal   Collection Time: 09/02/18  5:45 AM  Result Value Ref Range   Glucose-Capillary 122 (H) 70 - 99 mg/dL  Glucose, capillary     Status: None   Collection Time: 09/02/18  7:50 AM  Result Value Ref Range   Glucose-Capillary 72 70 - 99 mg/dL  Glucose, capillary     Status: Abnormal   Collection Time: 09/02/18 12:12 PM  Result Value Ref Range   Glucose-Capillary 44 (LL) 70 - 99 mg/dL   Comment 1 Notify RN   Glucose, capillary     Status: Abnormal   Collection Time: 09/02/18  1:07 PM  Result Value Ref Range   Glucose-Capillary 107 (H) 70 - 99 mg/dL  Glucose, capillary     Status: Abnormal   Collection Time: 09/02/18  5:23 PM  Result Value Ref Range   Glucose-Capillary 139 (H) 70 - 99 mg/dL  Glucose, capillary     Status: Abnormal   Collection Time: 09/02/18  9:41 PM  Result Value Ref Range   Glucose-Capillary 250 (H) 70 - 99 mg/dL  CBC with Differential/Platelet     Status: Abnormal   Collection Time: 09/03/18  2:43 AM  Result Value Ref Range   WBC 9.6 4.0 - 10.5 K/uL   RBC 2.55 (L) 4.22 - 5.81 MIL/uL   Hemoglobin 7.4 (L) 13.0 - 17.0 g/dL   HCT 22.8 (L) 39.0 - 52.0 %   MCV 89.4 80.0 - 100.0 fL   MCH 29.0 26.0 - 34.0 pg   MCHC 32.5 30.0 - 36.0 g/dL   RDW 14.4 11.5 - 15.5 %  Platelets 485 (H) 150 - 400 K/uL   nRBC 0.0 0.0 - 0.2 %   Neutrophils Relative % 69 %   Neutro Abs 6.6 1.7 - 7.7 K/uL   Lymphocytes Relative 18 %   Lymphs Abs 1.7 0.7 - 4.0 K/uL   Monocytes Relative 10 %   Monocytes Absolute 0.9 0.1 - 1.0 K/uL   Eosinophils Relative 2 %   Eosinophils Absolute 0.2 0.0 - 0.5 K/uL   Basophils Relative 0 %   Basophils Absolute 0.0 0.0 - 0.1 K/uL   Immature Granulocytes 1 %   Abs Immature Granulocytes 0.13 (H) 0.00 - 0.07 K/uL    Comment: Performed at [facility] [address], [place], [place] [address]  Basic  metabolic panel     Status: Abnormal   Collection Time: 09/03/18  2:43 AM  Result Value Ref Range   Sodium 138 135 - 145 mmol/L   Potassium 3.8 3.5 - 5.1 mmol/L   Chloride 104 98 - 111 mmol/L   CO2 28 22 - 32 mmol/L   Glucose, Bld 159 (H) 70 - 99 mg/dL   BUN 14 8 - 23 mg/dL   Creatinine, Ser 1.36 (H) 0.61 - 1.24 mg/dL   Calcium 7.7 (L) 8.9 - 10.3 mg/dL   GFR calc non Af Amer 56 (L) >60 mL/min   GFR calc Af Amer >60 >60 mL/min   Anion gap 6 5 - 15    Comment: Performed at [facility] [address], [place], [place] [address]  Magnesium     Status: None   Collection Time: 09/03/18  2:43 AM  Result Value Ref Range   Magnesium 1.8 1.7 - 2.4 mg/dL    Comment: Performed at [facility] [address] [place], [place] [address]  Glucose, capillary     Status: Abnormal   Collection Time: 09/03/18  8:02 AM  Result Value Ref Range   Glucose-Capillary 101 (H) 70 - 99 mg/dL  Glucose, capillary     Status: None   Collection Time: 09/03/18 12:02 PM  Result Value Ref Range   Glucose-Capillary 81 70 - 99 mg/dL    Medications:  Current Facility-Administered Medications  Medication Dose Route Frequency Provider Last Rate Last Dose  . acetaminophen (TYLENOL) tablet 650 mg  650 mg Oral Q6H PRN Jonetta Osgood, MD   650 mg at 09/02/18 1439  . amLODipine (NORVASC) tablet 5 mg  5 mg Oral Daily Jonetta Osgood, MD   5 mg at 09/03/18 0900  . Chlorhexidine Gluconate Cloth 2 % PADS 6 each  6 each Topical Q0600 Chesley Mires, MD   6 each at 08/31/18 0238  . feeding supplement (PRO-STAT SUGAR FREE 64) liquid 30 mL  30 mL Oral TID BM Alekh, Kshitiz, MD      . fluconazole (DIFLUCAN) tablet 200 mg  200 mg Oral Daily Vena Rua, PA-C   200 mg at 09/03/18 0900  . gabapentin (NEURONTIN) capsule 400 mg  400 mg Oral TID Deterding, Guadelupe Sabin, MD   400 mg at 09/03/18 0900  . insulin aspart (novoLOG) injection 1-5 Units  1-5 Units Subcutaneous TID AC & HS Jonetta Osgood, MD   2 Units  at 09/02/18 2212  . insulin glargine (LANTUS) injection 10 Units  10 Units Subcutaneous QHS Aline August, MD   10 Units at 09/02/18 2211  . ipratropium-albuterol (DUONEB) 0.5-2.5 (3) MG/3ML nebulizer solution 3 mL  3 mL Nebulization Q6H PRN Ghimire, Henreitta Leber,  MD      . multivitamin with minerals tablet 1 tablet  1 tablet Oral Daily Manuella Ghazi, Pratik D, DO   1 tablet at 09/03/18 0901  . ondansetron (ZOFRAN) injection 4 mg  4 mg Intravenous Q6H PRN Jonetta Osgood, MD   4 mg at 09/02/18 0141  . oxyCODONE (Oxy IR/ROXICODONE) immediate release tablet 5-10 mg  5-10 mg Oral Q4H PRN Schorr, Rhetta Mura, NP   10 mg at 09/03/18 0947  . pantoprazole sodium (PROTONIX) 40 mg/20 mL oral suspension 40 mg  40 mg Oral BID Manuella Ghazi, Pratik D, DO   40 mg at 09/03/18 0900  . sucralfate (CARAFATE) tablet 2 g  2 g Oral TID WC & HS Manuella Ghazi, Pratik D, DO   2 g at 09/03/18 1221    Musculoskeletal: Strength & Muscle Tone: No atrophy noted. Gait & Station: UTA since patient is lying in bed. Patient leans: N/A  Psychiatric Specialty Exam: Physical Exam  Nursing note and vitals reviewed. Constitutional: He appears well-developed and well-nourished.  HENT:  Head: Normocephalic and atraumatic.  Neck: Normal range of motion.  Respiratory: Effort normal.  Musculoskeletal: Normal range of motion.  Neurological: He is alert.  Oriented to person, place and year.  Psychiatric: He has a normal mood and affect. His speech is normal and behavior is normal. Judgment and thought content normal. Cognition and memory are impaired.    Review of Systems  Psychiatric/Behavioral: Positive for memory loss. Negative for hallucinations, substance abuse and suicidal ideas. The patient does not have insomnia.   All other systems reviewed and are negative.   Blood pressure (!) 128/51, pulse 86, temperature 99.1 F (37.3 C), temperature source Oral, resp. rate 16, height 6' (1.829 m), weight 74.1 kg, SpO2 93 %.Body mass index is 22.16 kg/m.   General Appearance: Fairly Groomed, elderly, Caucasian male, wearing a hospital gown who is lying in bed. NAD.   Eye Contact:  Good  Speech:  Clear and Coherent and Normal Rate  Volume:  Normal  Mood:  Angry  Affect:  Irritable related to wanting more food.  Thought Process:  Goal Directed, Linear and Descriptions of Associations: Intact  Orientation:  Other:  Oriented to person, place and year.  Thought Content:  Logical  Suicidal Thoughts:  No  Homicidal Thoughts:  No  Memory:  Immediate;   Poor Recent;   Fair Remote;   Fair  Judgement:  Poor  Insight:  Fair  Psychomotor Activity:  Normal  Concentration:  Concentration: Good and Attention Span: Good  Recall:  [employer] of Knowledge:  Fair  Language:  Good  Akathisia:  No  Handed:  Right  AIMS (if indicated):   N/A  Assets:  Communication Skills Desire for Improvement Housing Resilience Social Support  ADL's:  Impaired  Cognition: Impaired due to short term memory deficits.   Sleep:   N/A   Assessment:  [name] is a 63 y.o. male who was admitted with AMS in the setting of DKA, AKI and was found to have septic shock from pneumonia. He required intubation. He developed acute blood loss anemia from GI bleeding. He was extubated on 5/16. Psychiatry is consulted for hallucinations. He is awaiting placement in a SNF. He denies SI, HI or AVH. He does not appear to be responding to internal stimuli. He exhibits deficits in short term memory. Recommend Risperdal as needed for psychosis likely hospital acquired delirium given prolonged hospitalization.   Treatment Plan Summary: -Consider Risperdal 0.5 mg BID  PRN for psychosis.  -EKG reviewed and QTc 466 on 5/11. Please closely monitor when starting or increasing QTc prolonging agents.  -Psychiatry will sign off on patient at this time. Please consult psychiatry again as needed.   Disposition: No evidence of imminent risk to self or others at present.   Patient does not meet  criteria for psychiatric inpatient admission.  This service was provided via telemedicine using a 2-way, interactive audio and video technology.  Names of all persons participating in this telemedicine service and their role in this encounter. Name: Buford Dresser, DO Role: Psychiatrist  Name: [name]  Role: Patient     Faythe Dingwall, DO 09/03/2018 1:56 PM

## 2018-09-03 NOTE — Progress Notes (Signed)
SLP Cancellation Note  Patient Details Name: [name] MRN: [id number] DOB: 1956/02/19   Cancelled treatment:        [name] in to see pt who stated "Yeah, they are not feeding me. They are trying to control everything." Just then RN arrived with ipad for tele-assessment with psychiatrist. RN reported pt ate lunch- no problems masticating or signs aspiration. 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

## 2018-09-03 NOTE — Progress Notes (Signed)
Patient ID: [name], male   DOB: Jun 20, 1955, 63 y.o.   MRN: [id number]  PROGRESS NOTE    [name]  LZJ:[id number] DOB: 15-Feb-1956 DOA: 08/23/2018 PCP: Patient, No Pcp Per   Brief Narrative:  63 year old male with history of diabetes mellitus type 2, hypertension, cocaine/marijuana use presented to the hospital with acute metabolic encephalopathy in the setting of DKA/AKI and was found to have septic shock from pneumonia.  He was admitted to the ICU by PCCM and required intubation.  He also developed acute blood loss anemia from GI bleeding.  He underwent EGD on 08/25/2018.  He was subsequently extubated on 08/27/2018 and was transferred to [facility] on 08/29/2018.  He is currently awaiting transfer to SNF.  Assessment & Plan:   Active Problems:   DKA (diabetic ketoacidoses) ([place])  Acute metabolic encephalopathy -Most likely from DKA/septic shock/respiratory failure/AKI -Resolved -Alert and awake currently but a poor historian -Monitor mental status.  Fall precautions -Patient has been having intermittent hallucinations as per nursing staff.  Monitor.  Will request psychiatry evaluation.  Acute hypoxic respiratory failure Aspiration pneumonia -Intubated on admission and extubated on 08/27/2018. -Currently on room air -Has completed antibiotic course with Unasyn on 08/29/2018. -Sputum culture had shown rare Staphylococcus aureus and few Candida albicans. -COVID-19 testing was negative -Blood cultures have been negative so far  Upper GI bleeding Acute blood loss anemia -Underwent EGD on 08/25/2018 which showed severe esophagitis along with nonbleeding gastric ulcers. -Continue PPI twice a day and Carafate -Hemoglobin 7.4.  Transfuse if hemoglobin is less than 7. -GI is following peripherally and has started Diflucan for possible Candida esophagitis on 08/31/2018 -Outpatient follow-up with GI.  Hypertension -Blood pressure stable.  Continue amlodipine  Diabetes mellitus type 2 DKA:  Present on admission.  Resolved Hypoglycemia -Patient was hypoglycemic on 09/02/2018 morning and was confused.  NovoLog was subsequently discontinued.  Lantus dose was decreased to 10 units at bedtime.  Will increase Lantus to 12 units at bedtime.  Continue CBGs with SSI  Acute kidney injury -Resolved.  Monitor  Peripheral neuropathy next-continue Neurontin  Cocaine/marijuana use -Patient was counseled regarding the same during this hospitalization  Generalized deconditioning -Patient is very deconditioned and will benefit from SNF on discharge to continue ongoing physical therapy.  Social worker following.  Patient has been difficult to be placed in a nursing home because of prior history of drug abuse. -We will continue physical therapy evaluation in the hospital with goals to discharge patient home with home health if nursing home will not be available  Dysphagia -From acute illness/debility and esophagitis -Diet as per SLP recommendations -Continue empiric Diflucan as per GI for total of 14-day course which was started on 08/31/2018    DVT prophylaxis: SCDs Code Status: Full Family Communication: None at bedside Disposition Plan: SNF once bed is available  Consultants: PCCM/GI  Procedures: EGD on 08/25/2018: Severe esophagitis with nonbleeding gastric ulcers Intubation on 08/23/2018/extubation on 08/27/2018 Right IJ central line 08/23/2018-08/28/2018  Antimicrobials:  Anti-infectives (From admission, onward)   Start     Dose/Rate Route Frequency Ordered Stop   09/01/18 1700  piperacillin-tazobactam (ZOSYN) IVPB 3.375 g  Status:  Discontinued     3.375 g 12.5 mL/hr over 240 Minutes Intravenous Every 8 hours 09/01/18 1607 09/02/18 1008   08/31/18 1700  fluconazole (DIFLUCAN) tablet 200 mg     200 mg Oral Daily 08/31/18 1646 09/14/18 0959   08/27/18 1200  Ampicillin-Sulbactam (UNASYN) 3 g in sodium chloride 0.9 % 100 mL IVPB  3 g 200 mL/hr over 30 Minutes Intravenous Every 6  hours 08/27/18 1033 08/29/18 1729   08/27/18 1152  Ampicillin-Sulbactam (UNASYN) 3 g in sodium chloride 0.9 % 100 mL IVPB  Status:  Discontinued     3 g 200 mL/hr over 30 Minutes Intravenous Every 6 hours 08/27/18 1032 08/27/18 1033   08/26/18 0600  Ampicillin-Sulbactam (UNASYN) 3 g in sodium chloride 0.9 % 100 mL IVPB  Status:  Discontinued     3 g 200 mL/hr over 30 Minutes Intravenous Every 8 hours 08/26/18 0052 08/27/18 1032   08/23/18 1700  Ampicillin-Sulbactam (UNASYN) 3 g in sodium chloride 0.9 % 100 mL IVPB  Status:  Discontinued     3 g 200 mL/hr over 30 Minutes Intravenous Every 12 hours 08/23/18 1657 08/26/18 0052       Subjective: Patient seen and examined at bedside.  He is awake but a poor historian.  Had low-grade temperature overnight.  No overnight vomiting, diarrhea or worsening abdominal pain. Objective: Vitals:   09/02/18 2228 09/02/18 2300 09/03/18 0547 09/03/18 0619  BP: (!) 127/44  122/73   Pulse: 85  87   Resp: 16  16   Temp: 99.1 F (37.3 C)  99.1 F (37.3 C)   TempSrc: Oral  Oral   SpO2: (!) 88% 90% 90%   Weight:    74.1 kg  Height:        Intake/Output Summary (Last 24 hours) at 09/03/2018 0752 Last data filed at 09/03/2018 0618 Gross per 24 hour  Intake 480 ml  Output 300 ml  Net 180 ml   Filed Weights   08/31/18 0610 09/01/18 0419 09/03/18 0619  Weight: 76.9 kg 73.8 kg 74.1 kg    Examination:  General exam: Appears calm and comfortable.  No acute distress.  Awake, answers some questions. poor historian Respiratory system: Bilateral decreased breath sounds at bases with some scattered crackles Cardiovascular system: S1 & S2 heard, Rate controlled Gastrointestinal system: Abdomen is nondistended, soft and nontender. Normal bowel sounds heard. Extremities: No cyanosis, edema     Data Reviewed: I have personally reviewed following labs and imaging studies  CBC: Recent Labs  Lab 08/29/18 0307 08/31/18 0246 09/01/18 0246 09/02/18 0354  09/03/18 0243  WBC 8.9 12.5* 11.1* 10.8* 9.6  NEUTROABS  --   --   --   --  6.6  HGB 8.5* 8.8* 7.8* 7.8* 7.4*  HCT 26.0* 27.1* 23.8* 23.6* 22.8*  MCV 87.5 88.9 88.5 88.7 89.4  PLT 191 339 380 430* 099*   Basic Metabolic Panel: Recent Labs  Lab 08/30/18 0804 08/31/18 0246 09/01/18 0246 09/02/18 0354 09/03/18 0243  NA 142 140 137 139 138  K 3.8 3.9 3.6 3.7 3.8  CL 112* 109 106 105 104  CO2 24 25 25 26 28   GLUCOSE 226* 226* 154* 50* 159*  BUN 17 17 16 15 14   CREATININE 1.34* 1.18 1.24 1.31* 1.36*  CALCIUM 7.8* 7.9* 7.8* 7.9* 7.7*  MG 1.8  --   --   --  1.8   GFR: Estimated Creatinine Clearance: 59.8 mL/min (A) (by C-G formula based on SCr of 1.36 mg/dL (H)). Liver Function Tests: No results for input(s): AST, ALT, ALKPHOS, BILITOT, PROT, ALBUMIN in the last 168 hours. No results for input(s): LIPASE, AMYLASE in the last 168 hours. No results for input(s): AMMONIA in the last 168 hours. Coagulation Profile: No results for input(s): INR, PROTIME in the last 168 hours. Cardiac Enzymes: No results for input(s): CKTOTAL, CKMB,  CKMBINDEX, TROPONINI in the last 168 hours. BNP (last 3 results) No results for input(s): PROBNP in the last 8760 hours. HbA1C: No results for input(s): HGBA1C in the last 72 hours. CBG: Recent Labs  Lab 09/02/18 0750 09/02/18 1212 09/02/18 1307 09/02/18 1723 09/02/18 2141  GLUCAP 72 44* 107* 139* 250*   Lipid Profile: No results for input(s): CHOL, HDL, LDLCALC, TRIG, CHOLHDL, LDLDIRECT in the last 72 hours. Thyroid Function Tests: No results for input(s): TSH, T4TOTAL, FREET4, T3FREE, THYROIDAB in the last 72 hours. Anemia Panel: No results for input(s): VITAMINB12, FOLATE, FERRITIN, TIBC, IRON, RETICCTPCT in the last 72 hours. Sepsis Labs: No results for input(s): PROCALCITON, LATICACIDVEN in the last 168 hours.  Recent Results (from the past 240 hour(s))  Culture, respiratory (non-expectorated)     Status: None   Collection Time:  08/26/18  9:04 AM  Result Value Ref Range Status   Specimen Description TRACHEAL ASPIRATE  Final   Special Requests Normal  Final   Gram Stain   Final    FEW WBC PRESENT, PREDOMINANTLY PMN FEW SQUAMOUS EPITHELIAL CELLS PRESENT FEW BUDDING YEAST SEEN Performed at [facility], [address], [place], [place] [address]    Culture FEW CANDIDA ALBICANS RARE STAPHYLOCOCCUS AUREUS   Final   Report Status 08/29/2018 FINAL  Final   Organism ID, Bacteria STAPHYLOCOCCUS AUREUS  Final      Susceptibility   Staphylococcus aureus - MIC*    CIPROFLOXACIN <=0.5 SENSITIVE Sensitive     ERYTHROMYCIN <=0.25 SENSITIVE Sensitive     GENTAMICIN <=0.5 SENSITIVE Sensitive     OXACILLIN 0.5 SENSITIVE Sensitive     TETRACYCLINE <=1 SENSITIVE Sensitive     VANCOMYCIN 1 SENSITIVE Sensitive     TRIMETH/SULFA <=10 SENSITIVE Sensitive     CLINDAMYCIN <=0.25 SENSITIVE Sensitive     RIFAMPIN <=0.5 SENSITIVE Sensitive     Inducible Clindamycin NEGATIVE Sensitive     * RARE STAPHYLOCOCCUS AUREUS  Culture, blood (routine x 2)     Status: None (Preliminary result)   Collection Time: 09/01/18  1:40 PM  Result Value Ref Range Status   Specimen Description BLOOD RIGHT HAND  Final   Special Requests   Final    BOTTLES DRAWN AEROBIC ONLY Blood Culture adequate volume   Culture   Final    NO GROWTH 1 DAY Performed at [facility], [place] [address], [place], [place] [address]    Report Status PENDING  Incomplete  Culture, blood (routine x 2)     Status: None (Preliminary result)   Collection Time: 09/01/18  1:45 PM  Result Value Ref Range Status   Specimen Description BLOOD RIGHT HAND  Final   Special Requests   Final    BOTTLES DRAWN AEROBIC ONLY Blood Culture adequate volume   Culture   Final    NO GROWTH 1 DAY Performed at [facility], [place] [address] [place], [place] [address]    Report Status PENDING  Incomplete         Radiology Studies: Dg Chest Port 1 View  Result Date:  09/01/2018 CLINICAL DATA:  Shortness of breath EXAM: PORTABLE CHEST 1 VIEW COMPARISON:  Aug 27, 2018 FINDINGS: There is airspace consolidation in the left lower lobe with small left pleural effusion, slightly increased. Right lung remains clear. Heart size and pulmonary vascular normal. No adenopathy. There is right carotid artery calcification. No bone lesions. IMPRESSION: Left lower lobe airspace consolidation with small left pleural effusion, slightly increased from  most recent study. Right lung clear. Stable cardiac silhouette. There is right carotid artery calcification. Electronically Signed   By: Lowella Grip III M.D.   On: 09/01/2018 15:13        Scheduled Meds: . amLODipine  5 mg Oral Daily  . Chlorhexidine Gluconate Cloth  6 each Topical Q0600  . feeding supplement (PRO-STAT SUGAR FREE 64)  30 mL Oral TID BM  . fluconazole  200 mg Oral Daily  . gabapentin  400 mg Oral TID  . insulin aspart  1-5 Units Subcutaneous TID AC & HS  . insulin glargine  10 Units Subcutaneous QHS  . multivitamin with minerals  1 tablet Oral Daily  . pantoprazole sodium  40 mg Oral BID  . sucralfate  2 g Oral TID WC & HS   Continuous Infusions:    LOS: 11 days        Aline August, MD [name] 09/03/2018, 7:52 AM

## 2018-09-03 NOTE — Progress Notes (Signed)
Patient stating that we are refusing to feed him, despite his 100% intake for breakfast and lunch. Patient has also repeatedly stated that the cops are here to search him and that he has nothing to hide.

## 2018-09-03 NOTE — Progress Notes (Signed)
Occupational Therapy Treatment Patient Details Name: [name] MRN: [id number] DOB: October 24, 1955 Today's Date: 09/03/2018    History of present illness 63 y.o. male admitted on 08/23/18 for severe diabetic ketoacidosis hypothermia, AMS, hypoxia, elevated amonia, lactic acidosis, hyperkalemia, AKI, UGIB, and aspiration PNA. (+) cocaine and marijuana.  Intubated in the ED on 5/10 extubated 08/27/18.    Pt s/p EGD on 08/25/18.  Pt with other significant PMH of diabetic retinopathy, HTN, DM 1, peripheral vascular balloon angio, R toe amputation.    OT comments  Pt performing grooming at sink with minguard to minA for toilet hygiene in standing. Pt performing ADL functional transfers and ADL functional mobility with minA overall for safety. Pt progressing well. Pt would benefit from continued OT skilled services for ADL, mobility and safety in SNF setting. OT to follow acutely.     Follow Up Recommendations  SNF;Supervision/Assistance - 24 hour    Equipment Recommendations  None recommended by OT    Recommendations for Other Services      Precautions / Restrictions Precautions Precautions: Fall Restrictions Weight Bearing Restrictions: No       Mobility Bed Mobility Overal bed mobility: Needs Assistance Bed Mobility: Supine to Sit     Supine to sit: HOB elevated;Supervision     General bed mobility comments: use of rail and HOB elevated; supervision for safety  Transfers Overall transfer level: Needs assistance   Transfers: Sit to/from Stand Sit to Stand: Min assist         General transfer comment: Increased time required    Balance Overall balance assessment: Needs assistance Sitting-balance support: Feet supported;No upper extremity supported Sitting balance-Leahy Scale: Good     Standing balance support: Bilateral upper extremity supported;During functional activity Standing balance-Leahy Scale: Poor Standing balance comment: requires bilat UE on RW and external  support                           ADL either performed or assessed with clinical judgement   ADL Overall ADL's : Needs assistance/impaired Eating/Feeding: Independent   Grooming: Wash/dry hands;Wash/dry face;Oral care;Standing                               Functional mobility during ADLs: Minimal assistance;Rolling walker General ADL Comments: Pt modA for ADL at this time. Pt able to perform toilet hygiene and not make a mess.     Vision   Vision Assessment?: No apparent visual deficits   Perception     Praxis      Cognition Arousal/Alertness: Awake/alert Behavior During Therapy: WFL for tasks assessed/performed;Impulsive Overall Cognitive Status: No family/caregiver present to determine baseline cognitive functioning Area of Impairment: Following commands;Safety/judgement;Problem solving;Memory                     Memory: Decreased short-term memory Following Commands: Follows one step commands inconsistently;Follows one step commands with increased time Safety/Judgement: Decreased awareness of safety;Decreased awareness of deficits   Problem Solving: Difficulty sequencing;Requires verbal cues;Decreased initiation General Comments: Pt following commands and prefers to stay in bed today        Exercises     Shoulder Instructions       General Comments      Pertinent Vitals/ Pain       Pain Assessment: No/denies pain  Home Living  Prior Functioning/Environment              Frequency  Min 2X/week        Progress Toward Goals  OT Goals(current goals can now be found in the care plan section)  Progress towards OT goals: Progressing toward goals  Acute Rehab OT Goals Patient Stated Goal: to go home OT Goal Formulation: With patient Time For Goal Achievement: 09/11/18 Potential to Achieve Goals: Good ADL Goals Pt Will Perform Grooming: with modified  independence;sitting Pt Will Perform Upper Body Dressing: with modified independence;sitting Pt Will Perform Lower Body Dressing: with set-up;sitting/lateral leans;sit to/from stand Pt Will Transfer to Toilet: with modified independence;ambulating Pt Will Perform Toileting - Clothing Manipulation and hygiene: with modified independence;sit to/from stand Pt Will Perform Tub/Shower Transfer: with supervision Additional ADL Goal #1: Pt will perform ADL tasks at sink x5 mins with supervisionA and fair balance.  Plan Discharge plan remains appropriate    Co-evaluation                 AM-PAC OT "6 Clicks" Daily Activity     Outcome Measure   Help from another person eating meals?: None Help from another person taking care of personal grooming?: A Little Help from another person toileting, which includes using toliet, bedpan, or urinal?: A Little Help from another person bathing (including washing, rinsing, drying)?: A Little Help from another person to put on and taking off regular upper body clothing?: A Little Help from another person to put on and taking off regular lower body clothing?: A Little 6 Click Score: 19    End of Session Equipment Utilized During Treatment: Gait belt;Rolling walker  OT Visit Diagnosis: Muscle weakness (generalized) (M62.81);Unsteadiness on feet (R26.81)   Activity Tolerance Patient tolerated treatment well   Patient Left in chair;with call bell/phone within reach;with chair alarm set   Nurse Communication Mobility status        Time: 3612-2449 OT Time Calculation (min): 15 min  Charges: OT General Charges $OT Visit: 1 Visit OT Treatments $Self Care/Home Management : 8-22 mins  Darryl Nestle) Marsa Aris OTR/L Acute Rehabilitation Services Pager: [phone] Office: [place] 09/03/2018, 1:54 PM

## 2018-09-04 DIAGNOSIS — R443 Hallucinations, unspecified: Secondary | ICD-10-CM

## 2018-09-04 LAB — COMPREHENSIVE METABOLIC PANEL
ALT: 22 U/L (ref 0–44)
AST: 19 U/L (ref 15–41)
Albumin: 1.4 g/dL — ABNORMAL LOW (ref 3.5–5.0)
Alkaline Phosphatase: 210 U/L — ABNORMAL HIGH (ref 38–126)
Anion gap: 8 (ref 5–15)
BUN: 14 mg/dL (ref 8–23)
CO2: 26 mmol/L (ref 22–32)
Calcium: 7.9 mg/dL — ABNORMAL LOW (ref 8.9–10.3)
Chloride: 102 mmol/L (ref 98–111)
Creatinine, Ser: 1.37 mg/dL — ABNORMAL HIGH (ref 0.61–1.24)
GFR calc Af Amer: >60 mL/min (ref >60)
GFR calc non Af Amer: 55 mL/min — ABNORMAL LOW (ref >60)
Glucose, Bld: 271 mg/dL — ABNORMAL HIGH (ref 70–99)
Potassium: 4.2 mmol/L (ref 3.5–5.1)
Sodium: 136 mmol/L (ref 135–145)
Total Bilirubin: 0.4 mg/dL (ref 0.3–1.2)
Total Protein: 5 g/dL — ABNORMAL LOW (ref 6.5–8.1)

## 2018-09-04 LAB — CBC WITH DIFFERENTIAL/PLATELET
Abs Immature Granulocytes: 0.12 10*3/uL — ABNORMAL HIGH (ref 0.00–0.07)
Basophils Absolute: 0.1 10*3/uL (ref 0.0–0.1)
Basophils Relative: 1 %
Eosinophils Absolute: 0.2 10*3/uL (ref 0.0–0.5)
Eosinophils Relative: 2 %
HCT: 21.6 % — ABNORMAL LOW (ref 39.0–52.0)
Hemoglobin: 7.1 g/dL — ABNORMAL LOW (ref 13.0–17.0)
Immature Granulocytes: 1 %
Lymphocytes Relative: 17 %
Lymphs Abs: 1.7 10*3/uL (ref 0.7–4.0)
MCH: 28.9 pg (ref 26.0–34.0)
MCHC: 32.9 g/dL (ref 30.0–36.0)
MCV: 87.8 fL (ref 80.0–100.0)
Monocytes Absolute: 1 10*3/uL (ref 0.1–1.0)
Monocytes Relative: 11 %
Neutro Abs: 6.7 10*3/uL (ref 1.7–7.7)
Neutrophils Relative %: 68 %
Platelets: 515 10*3/uL — ABNORMAL HIGH (ref 150–400)
RBC: 2.46 MIL/uL — ABNORMAL LOW (ref 4.22–5.81)
RDW: 14.2 % (ref 11.5–15.5)
WBC: 9.8 10*3/uL (ref 4.0–10.5)
nRBC: 0 % (ref 0.0–0.2)

## 2018-09-04 LAB — GLUCOSE, CAPILLARY
Glucose-Capillary: 246 mg/dL — ABNORMAL HIGH (ref 70–99)
Glucose-Capillary: 221 mg/dL — ABNORMAL HIGH (ref 70–99)
Glucose-Capillary: 258 mg/dL — ABNORMAL HIGH (ref 70–99)
Glucose-Capillary: 245 mg/dL — ABNORMAL HIGH (ref 70–99)
Glucose-Capillary: 313 mg/dL — ABNORMAL HIGH (ref 70–99)

## 2018-09-04 LAB — MAGNESIUM: Magnesium: 1.7 mg/dL (ref 1.7–2.4)

## 2018-09-04 MED ORDER — INSULIN GLARGINE 100 UNIT/ML ~~LOC~~ SOLN
12.0000 [IU] | Freq: Every day | SUBCUTANEOUS | Status: DC
  Administered 2018-09-04: 12 [IU] via SUBCUTANEOUS
  Filled 2018-09-04 (×2): qty 0.12

## 2018-09-04 MED ORDER — PRO-STAT SUGAR FREE PO LIQD
30.0000 mL | Freq: Two times a day (BID) | ORAL | Status: DC
  Administered 2018-09-06: 30 mL via ORAL
  Filled 2018-09-04 (×5): qty 30

## 2018-09-04 MED ORDER — RISPERIDONE 0.5 MG PO TABS
0.5000 mg | ORAL_TABLET | Freq: Two times a day (BID) | ORAL | Status: DC | PRN
  Administered 2018-09-04 – 2018-09-07 (×3): 0.5 mg via ORAL
  Filled 2018-09-04 (×5): qty 1

## 2018-09-04 MED ORDER — SUCRALFATE 1 GM/10ML PO SUSP
2.0000 g | Freq: Three times a day (TID) | ORAL | Status: DC
  Administered 2018-09-04 – 2018-09-05 (×5): 2 g via ORAL
  Filled 2018-09-04 (×5): qty 20

## 2018-09-04 NOTE — Progress Notes (Signed)
Notified by NT during change of shift patient fell at prior to night shift RN handoff. See RN [place] note for documentation of fall. Day and night RN arrived to patient bedside. Patient agitated and indicating being held against his will, verbally aggressive. Informed patient language, tone and delivery was inappropriate. Informed patient he was under medical care at hospital and will discuss discharge plans in morning, however, not safe for him to leave during the night. Patient called police; police arrived to patient's bedside. Agricultural consultant notified.   Patient assessed and VS taken. Patient and NT present during fall stated patient did not sustain injury nor hit head during fall.  Notified NP [place] via page at 2004 of patient fall and need for safety sitter. Staffing notified by charge RN for sitter request and safety sitter requested via order.   NP Blount callback at 2010. Informed [place] RN requested Air cabin crew and although patient can be calmed and redirected, he is high risk for subsequent falls due to impulsivity, agitation, intermittent hallucinations and confusion.  Upon reassessment at 2005 patient denied hitting arm, head or injuring self. Requested pain meds for leg pain; chronic pain. Patient also requested gabapentin and indicated not able to administer medication because scheduled for 2200. PRN oxycodone given. Patient agitated and did not allow RN to complete full physical assessment.   Patient removed right forearm IV. Informed patient IV in place in order to administer medications if necessary and that a new IV will be placed.   Safety sitter at bedside from 2300 to 0300; staffing not able to provide safety sitter after 0300 and due to impulsivity and trying to get OOB patient placed near nurses' station for closer monitoring.   Intermittent impulsivity, agitation and confusion during night as patient repeatedly requested to leave and trying to get OOB. Patient slept for  approximately 3-4 hours during night.

## 2018-09-04 NOTE — Progress Notes (Signed)
Patient ID: CHASETON YEPIZ, male   DOB: [birth year], 63 y.o.   MRN: [id number]  PROGRESS NOTE    DALAN COWGER  LGX:[id number] DOB: 1955/08/12 DOA: 08/23/2018 PCP: Patient, No Pcp Per   Brief Narrative:  63 year old male with history of diabetes mellitus type 2, hypertension, cocaine/marijuana use presented to the hospital with acute metabolic encephalopathy in the setting of DKA/AKI and was found to have septic shock from pneumonia.  He was admitted to the ICU by PCCM and required intubation.  He also developed acute blood loss anemia from GI bleeding.  He underwent EGD on 08/25/2018.  He was subsequently extubated on 08/27/2018 and was transferred to [facility] on 08/29/2018.  He is currently awaiting transfer to SNF.  Assessment & Plan:   Principal Problem:   Hallucinations Active Problems:   DKA (diabetic ketoacidoses) ([name]  Acute metabolic encephalopathy -Most likely from DKA/septic shock/respiratory failure/AKI -Resolved -Alert and awake currently but a poor historian -Monitor mental status.  Fall precautions  Intermittent hallucinations -Patient has been having intermittent hallucinations as per nursing staff.  Monitor.   -Psychiatry evaluation appreciated.  Will start Risperdal 0.5 mg twice a day as needed for psychosis  Acute hypoxic respiratory failure Aspiration pneumonia -Intubated on admission and extubated on 08/27/2018. -Currently on room air -Has completed antibiotic course with Unasyn on 08/29/2018. -Sputum culture had shown rare Staphylococcus aureus and few Candida albicans. -COVID-19 testing was negative -Blood cultures have been negative so far  Upper GI bleeding Acute blood loss anemia -Underwent EGD on 08/25/2018 which showed severe esophagitis along with nonbleeding gastric ulcers. -Continue PPI twice a day and Carafate -Hemoglobin 7.1.  Transfuse if hemoglobin is less than 7. -GI is following peripherally and has started Diflucan for possible Candida esophagitis on  08/31/2018 -Outpatient follow-up with GI.  Hypertension -Blood pressure stable.  Continue amlodipine  Diabetes mellitus type 2 DKA: Present on admission.  Resolved Hypoglycemia -Patient was hypoglycemic on 09/02/2018 morning and was confused.  NovoLog was subsequently discontinued.  Lantus dose was decreased to 10 units at bedtime.  Will increase Lantus to 12 units at bedtime.  Continue CBGs with SSI  Acute kidney injury -Resolved.  Monitor  Peripheral neuropathy  -continue Neurontin  Cocaine/marijuana use -Patient was counseled regarding the same during this hospitalization  Generalized deconditioning -Patient is very deconditioned and will benefit from SNF on discharge to continue ongoing physical therapy.  Social worker following.  Patient has been difficult to be placed in a nursing home because of prior history of drug abuse. -We will continue physical therapy evaluation in the hospital with goals to discharge patient home with home health if nursing home will not be available  Hypoalbuminemia -Follow nutrition recommendations  Dysphagia -From acute illness/debility and esophagitis -Diet as per SLP recommendations -Continue empiric Diflucan as per GI for total of 14-day course which was started on 08/31/2018    DVT prophylaxis: SCDs Code Status: Full Family Communication: None at bedside Disposition Plan: SNF once bed is available versus home with home health if improves with physical therapy  Consultants: PCCM/GI  Procedures: EGD on 08/25/2018: Severe esophagitis with nonbleeding gastric ulcers Intubation on 08/23/2018/extubation on 08/27/2018 Right IJ central line 08/23/2018-08/28/2018  Antimicrobials:  Anti-infectives (From admission, onward)   Start     Dose/Rate Route Frequency Ordered Stop   09/01/18 1700  piperacillin-tazobactam (ZOSYN) IVPB 3.375 g  Status:  Discontinued     3.375 g 12.5 mL/hr over 240 Minutes Intravenous Every 8 hours 09/01/18 1607 09/02/18  1008  08/31/18 1700  fluconazole (DIFLUCAN) tablet 200 mg     200 mg Oral Daily 08/31/18 1646 09/14/18 0959   08/27/18 1200  Ampicillin-Sulbactam (UNASYN) 3 g in sodium chloride 0.9 % 100 mL IVPB     3 g 200 mL/hr over 30 Minutes Intravenous Every 6 hours 08/27/18 1033 08/29/18 1729   08/27/18 1152  Ampicillin-Sulbactam (UNASYN) 3 g in sodium chloride 0.9 % 100 mL IVPB  Status:  Discontinued     3 g 200 mL/hr over 30 Minutes Intravenous Every 6 hours 08/27/18 1032 08/27/18 1033   08/26/18 0600  Ampicillin-Sulbactam (UNASYN) 3 g in sodium chloride 0.9 % 100 mL IVPB  Status:  Discontinued     3 g 200 mL/hr over 30 Minutes Intravenous Every 8 hours 08/26/18 0052 08/27/18 1032   08/23/18 1700  Ampicillin-Sulbactam (UNASYN) 3 g in sodium chloride 0.9 % 100 mL IVPB  Status:  Discontinued     3 g 200 mL/hr over 30 Minutes Intravenous Every 12 hours 08/23/18 1657 08/26/18 0052        Subjective: Patient seen and examined at bedside.  He is a very poor historian.  He is awake.  States that he is only.  No overnight fever, nausea or vomiting.  Still very weak and hardly participates in physical therapy.   Objective: Vitals:   09/03/18 1723 09/03/18 2022 09/04/18 0042 09/04/18 0509  BP: (!) 115/50 (!) 132/53 (!) 110/47 (!) 116/55  Pulse: 86 83 80 79  Resp: 18     Temp: 99.8 F (37.7 C) 99.2 F (37.3 C) 98.7 F (37.1 C) 98.7 F (37.1 C)  TempSrc: Oral Oral Oral Oral  SpO2: (!) 87% 90% 96% 90%  Weight:      Height:        Intake/Output Summary (Last 24 hours) at 09/04/2018 0723 Last data filed at 09/04/2018 0137 Gross per 24 hour  Intake -  Output 525 ml  Net -525 ml   Filed Weights   08/31/18 0610 09/01/18 0419 09/03/18 0619  Weight: 76.9 kg 73.8 kg 74.1 kg    Examination:  General exam: Appears calm and comfortable.  No distress.  Awake, answers some questions. poor historian Respiratory system: Bilateral decreased breath sounds at bases with some scattered crackles.  No  wheezing Cardiovascular system: Rate controlled, S1 & S2 heard Gastrointestinal system: Abdomen is nondistended, soft and nontender. Normal bowel sounds heard. Extremities: No cyanosis, edema     Data Reviewed: I have personally reviewed following labs and imaging studies  CBC: Recent Labs  Lab 08/31/18 0246 09/01/18 0246 09/02/18 0354 09/03/18 0243 09/04/18 0309  WBC 12.5* 11.1* 10.8* 9.6 9.8  NEUTROABS  --   --   --  6.6 6.7  HGB 8.8* 7.8* 7.8* 7.4* 7.1*  HCT 27.1* 23.8* 23.6* 22.8* 21.6*  MCV 88.9 88.5 88.7 89.4 87.8  PLT 339 380 430* 485* 829*   Basic Metabolic Panel: Recent Labs  Lab 08/30/18 0804 08/31/18 0246 09/01/18 0246 09/02/18 0354 09/03/18 0243 09/04/18 0309  NA 142 140 137 139 138 136  K 3.8 3.9 3.6 3.7 3.8 4.2  CL 112* 109 106 105 104 102  CO2 24 25 25 26 28 26   GLUCOSE 226* 226* 154* 50* 159* 271*  BUN 17 17 16 15 14 14   CREATININE 1.34* 1.18 1.24 1.31* 1.36* 1.37*  CALCIUM 7.8* 7.9* 7.8* 7.9* 7.7* 7.9*  MG 1.8  --   --   --  1.8 1.7   GFR: Estimated Creatinine Clearance:  59.3 mL/min (A) (by C-G formula based on SCr of 1.37 mg/dL (H)). Liver Function Tests: Recent Labs  Lab 09/04/18 0309  AST 19  ALT 22  ALKPHOS 210*  BILITOT 0.4  PROT 5.0*  ALBUMIN 1.4*   No results for input(s): LIPASE, AMYLASE in the last 168 hours. No results for input(s): AMMONIA in the last 168 hours. Coagulation Profile: No results for input(s): INR, PROTIME in the last 168 hours. Cardiac Enzymes: No results for input(s): CKTOTAL, CKMB, CKMBINDEX, TROPONINI in the last 168 hours. BNP (last 3 results) No results for input(s): PROBNP in the last 8760 hours. HbA1C: No results for input(s): HGBA1C in the last 72 hours. CBG: Recent Labs  Lab 09/03/18 0802 09/03/18 1202 09/03/18 1728 09/03/18 2229 09/04/18 0627  GLUCAP 101* 81 223* 260* 246*   Lipid Profile: No results for input(s): CHOL, HDL, LDLCALC, TRIG, CHOLHDL, LDLDIRECT in the last 72 hours. Thyroid  Function Tests: No results for input(s): TSH, T4TOTAL, FREET4, T3FREE, THYROIDAB in the last 72 hours. Anemia Panel: No results for input(s): VITAMINB12, FOLATE, FERRITIN, TIBC, IRON, RETICCTPCT in the last 72 hours. Sepsis Labs: No results for input(s): PROCALCITON, LATICACIDVEN in the last 168 hours.  Recent Results (from the past 240 hour(s))  Culture, respiratory (non-expectorated)     Status: None   Collection Time: 08/26/18  9:04 AM  Result Value Ref Range Status   Specimen Description TRACHEAL ASPIRATE  Final   Special Requests Normal  Final   Gram Stain   Final    FEW WBC PRESENT, PREDOMINANTLY PMN FEW SQUAMOUS EPITHELIAL CELLS PRESENT FEW BUDDING YEAST SEEN Performed at [facility], [address], [place], [place] [address]    Culture FEW CANDIDA ALBICANS RARE STAPHYLOCOCCUS AUREUS   Final   Report Status 08/29/2018 FINAL  Final   Organism ID, Bacteria STAPHYLOCOCCUS AUREUS  Final      Susceptibility   Staphylococcus aureus - MIC*    CIPROFLOXACIN <=0.5 SENSITIVE Sensitive     ERYTHROMYCIN <=0.25 SENSITIVE Sensitive     GENTAMICIN <=0.5 SENSITIVE Sensitive     OXACILLIN 0.5 SENSITIVE Sensitive     TETRACYCLINE <=1 SENSITIVE Sensitive     VANCOMYCIN 1 SENSITIVE Sensitive     TRIMETH/SULFA <=10 SENSITIVE Sensitive     CLINDAMYCIN <=0.25 SENSITIVE Sensitive     RIFAMPIN <=0.5 SENSITIVE Sensitive     Inducible Clindamycin NEGATIVE Sensitive     * RARE STAPHYLOCOCCUS AUREUS  Culture, blood (routine x 2)     Status: None (Preliminary result)   Collection Time: 09/01/18  1:40 PM  Result Value Ref Range Status   Specimen Description BLOOD RIGHT HAND  Final   Special Requests   Final    BOTTLES DRAWN AEROBIC ONLY Blood Culture adequate volume   Culture   Final    NO GROWTH 2 DAYS Performed at [facility], [place] [address], [place], [place] [address]    Report Status PENDING  Incomplete  Culture, blood (routine x 2)     Status: None (Preliminary result)    Collection Time: 09/01/18  1:45 PM  Result Value Ref Range Status   Specimen Description BLOOD RIGHT HAND  Final   Special Requests   Final    BOTTLES DRAWN AEROBIC ONLY Blood Culture adequate volume   Culture   Final    NO GROWTH 2 DAYS Performed at [facility], [place] [address], [place], [place] [address]    Report Status PENDING  Incomplete  Radiology Studies: No results found.      Scheduled Meds: . amLODipine  5 mg Oral Daily  . Chlorhexidine Gluconate Cloth  6 each Topical Q0600  . feeding supplement (PRO-STAT SUGAR FREE 64)  30 mL Oral TID BM  . fluconazole  200 mg Oral Daily  . gabapentin  400 mg Oral TID  . insulin aspart  1-5 Units Subcutaneous TID AC & HS  . insulin glargine  10 Units Subcutaneous QHS  . multivitamin with minerals  1 tablet Oral Daily  . pantoprazole sodium  40 mg Oral BID  . sucralfate  2 g Oral TID WC & HS   Continuous Infusions:    LOS: 12 days        Aline August, MD [name] 09/04/2018, 7:23 AM

## 2018-09-04 NOTE — Progress Notes (Signed)
   09/04/18 1958 09/04/18 2000  What Happened  Was fall witnessed?  --  Yes  Who witnessed fall?  --  Juve, [place]  Patients activity before fall  --  other (comment) (pt attempted to get out of bed and leave)  Point of contact  --  other (comment) (knees)  Was patient injured?  --  No  Follow Up  MD notified  --   Melvia Heaps)  Time MD notified  --  52  Family notified  --  Yes-comment Adora Fridge son)  Time family notified  --  2009  Additional tests  --  No  Simple treatment  --  Other (comment) (close monitoring of pt. Saftey sitter being placed in room)  Progress note created (see row info)  --  Yes  Adult Fall Risk Assessment  Risk Factor Category (scoring not indicated)  --  Not Applicable  Age  --  1  Fall History: Fall within 6 months prior to admission  --  5  Elimination; Bowel and/or Urine Incontinence  --  0  Elimination; Bowel and/or Urine Urgency/Frequency  --  0  Medications: includes PCA/Opiates, Anti-convulsants, Anti-hypertensives, Diuretics, Hypnotics, Laxatives, Sedatives, and Psychotropics  --  3  Patient Care Equipment  --  0  Mobility-Assistance  --  2  Mobility-Gait  --  2  Mobility-Sensory Deficit  --  0  Altered awareness of immediate physical environment  --  0  Impulsiveness  --  0  Lack of understanding of one's physical/cognitive limitations  --  0  Total Score  --  13  Patient Fall Risk Level  --  High fall risk  Adult Fall Risk Interventions  Required Bundle Interventions *See Row Information*  --  High fall risk - low, moderate, and high requirements implemented  Additional Interventions  --  Use of appropriate toileting equipment (bedpan, BSC, etc.)  Screening for Fall Injury Risk (To be completed on HIGH fall risk patients) - Assessing Need for Low Bed  Risk For Fall Injury- Low Bed Criteria  --  None identified - Continue screening  Will Implement Low Bed and Floor Mats  --  No - Criteria no longer met for low bed (saftey sitter being  placed in room)  Screening for Fall Injury Risk (To be completed on HIGH fall risk patients who do not meet crieteria for Low Bed) - Assessing Need for Floor Mats Only  Risk For Fall Injury- Criteria for Floor Mats  --  Noncompliant with safety precautions  Vitals  Temp 98.8 F (37.1 C)  --   Temp Source Oral  --   BP 135/80  --   MAP (mmHg) 96  --   BP Location Right Arm  --   BP Method Automatic  --   Patient Position (if appropriate) Lying  --   Pulse Rate 89  --   Oxygen Therapy  SpO2 93 %  --   O2 Device Room Air  --   Pain Assessment  Pain Scale  --  0-10  Pain Score  --  0

## 2018-09-04 NOTE — Progress Notes (Signed)
During medication administration patient chewing sucralfate tablets even when split in half and administered with pudding (patient consumed 1 of 2 tablets). Informed patient will ask if subsequent does can be administered in liquid form. Informed day RN.    Approximately 0400 patient became agitated and increasingly confused upon NT assisting him to [facility]. RN to patient bedside and reoriented patient to time and events. Patient stated he needed to contact his son to come pick him up and wanted breakfast. Patient also stated he was being held/kidnapped at hospital. Informed patient he was hospitalized to receive medical care, would not be able to leave during the night and breakfast would be served in the morning. Patient calmed and assisted back to bed.

## 2018-09-04 NOTE — Progress Notes (Signed)
Physical Therapy Treatment Patient Details Name: [name] MRN: [id number] DOB: 1955/07/10 Today's Date: 09/04/2018    History of Present Illness 63 y.o. male admitted on 08/23/18 for severe diabetic ketoacidosis hypothermia, AMS, hypoxia, elevated amonia, lactic acidosis, hyperkalemia, AKI, UGIB, and aspiration PNA. (+) cocaine and marijuana.  Intubated in the ED on 5/10 extubated 08/27/18.    Pt s/p EGD on 08/25/18.  Pt with other significant PMH of diabetic retinopathy, HTN, DM 1, peripheral vascular balloon angio, R toe amputation.     PT Comments    Patient seen for mobility progression. Pt is cooperative and agreeable to therapy. Pt tolerated increased gait distance this session. Pt does continue to present with gait deficits, impaired balance, and generalized weakness. Continue to progress as tolerated.     Follow Up Recommendations  SNF     Equipment Recommendations  Rolling walker with 5" wheels    Recommendations for Other Services       Precautions / Restrictions Precautions Precautions: Fall Restrictions Weight Bearing Restrictions: No    Mobility  Bed Mobility Overal bed mobility: Needs Assistance Bed Mobility: Supine to Sit;Sit to Supine     Supine to sit: HOB elevated;Supervision     General bed mobility comments: use of rail and HOB elevated; supervision for safety  Transfers Overall transfer level: Needs assistance Equipment used: Rolling walker (2 wheeled) Transfers: Sit to/from Stand Sit to Stand: Min assist;Min guard         General transfer comment: assist to steady  Ambulation/Gait Ambulation/Gait assistance: Min assist;Mod assist;+2 safety/equipment(chair follow) Gait Distance (Feet): 120 Feet Assistive device: Rolling walker (2 wheeled) Gait Pattern/deviations: Step-through pattern;Decreased stride length;Decreased dorsiflexion - right;Decreased dorsiflexion - left;Scissoring;Narrow base of support Gait velocity: decreased   General  Gait Details: pt with decreased bilat dorsiflexion and near scissoring at times requiring assistance to maintain balance with use of RW; pt with steppage gait pattern   Stairs             Wheelchair Mobility    Modified Rankin (Stroke Patients Only)       Balance Overall balance assessment: Needs assistance Sitting-balance support: Feet supported;No upper extremity supported Sitting balance-Leahy Scale: Good     Standing balance support: Bilateral upper extremity supported;During functional activity Standing balance-Leahy Scale: Poor Standing balance comment: requires bilat UE on RW and external support                            Cognition Arousal/Alertness: Awake/alert Behavior During Therapy: WFL for tasks assessed/performed;Impulsive Overall Cognitive Status: No family/caregiver present to determine baseline cognitive functioning Area of Impairment: Following commands;Safety/judgement;Problem solving;Memory                     Memory: Decreased short-term memory Following Commands: Follows one step commands with increased time Safety/Judgement: Decreased awareness of safety;Decreased awareness of deficits   Problem Solving: Difficulty sequencing;Decreased initiation;Requires verbal cues        Exercises      General Comments        Pertinent Vitals/Pain Pain Assessment: No/denies pain    Home Living                      Prior Function            PT Goals (current goals can now be found in the care plan section) Acute Rehab PT Goals Patient Stated Goal: to go home Progress towards PT  goals: Progressing toward goals    Frequency    Min 3X/week      PT Plan Current plan remains appropriate    Co-evaluation              AM-PAC PT "6 Clicks" Mobility   Outcome Measure  Help needed turning from your back to your side while in a flat bed without using bedrails?: A Little Help needed moving from lying on  your back to sitting on the side of a flat bed without using bedrails?: A Little Help needed moving to and from a bed to a chair (including a wheelchair)?: A Little Help needed standing up from a chair using your arms (e.g., wheelchair or bedside chair)?: A Little Help needed to walk in hospital room?: A Lot Help needed climbing 3-5 steps with a railing? : A Lot 6 Click Score: 16    End of Session Equipment Utilized During Treatment: Gait belt Activity Tolerance: Patient tolerated treatment well Patient left: in bed;with call bell/phone within reach;with bed alarm set Nurse Communication: Mobility status PT Visit Diagnosis: Muscle weakness (generalized) (M62.81);Difficulty in walking, not elsewhere classified (R26.2)     Time: 1314-3888 PT Time Calculation (min) (ACUTE ONLY): 20 min  Charges:  $Gait Training: 8-22 mins                     Earney Navy, PTA Acute Rehabilitation Services Pager: [phone] Office: [phone]     Darliss Cheney 09/04/2018, 5:15 PM

## 2018-09-04 NOTE — TOC Progression Note (Signed)
Transition of Care [facility]) - Progression Note    Patient Details  Name: [name] MRN: [id number] Date of Birth: May 15, 1955  Transition of Care [facility]) CM/SW [place] , LCSW Phone Number: 09/04/2018, 4:55 PM  Clinical Narrative:    Patient continues not to have placement options.    Expected Discharge Plan: Skilled Nursing Facility Barriers to Discharge: Insurance Authorization  Expected Discharge Plan and Services Expected Discharge Plan: [place]       Living arrangements for the past 2 months: Single Family Home Expected Discharge Date: 08/31/18                                     Social Determinants of Health (SDOH) Interventions    Readmission Risk Interventions Readmission Risk Prevention Plan 08/12/2018  Transportation Screening Complete  Medication Review Press photographer) Complete  PCP or Specialist appointment within 3-5 days of discharge Complete  HRI or [place] Complete  SW Recovery Care/Counseling Consult Complete  [place] Not Applicable  Some recent data might be hidden

## 2018-09-04 NOTE — Progress Notes (Signed)
Nutrition Follow-up  RD working remotely.  DOCUMENTATION CODES:   Not applicable  INTERVENTION:   -Continue MVI with minerals daily -Decrease 30 ml Prostat to BID, each supplement provides 100 kcals and 15 grams protein -Continue Magic Cup TID with meals, each supplement provides 290 kcals and 9 grams protein  NUTRITION DIAGNOSIS:   Inadequate oral intake related to decreased appetite(esophagitis) as evidenced by per patient/family report.  Progressing  GOAL:   Patient will meet greater than or equal to 90% of their needs  Progress  MONITOR:   PO intake, Supplement acceptance, Diet advancement, Labs, Weight trends, Skin, I & O's  REASON FOR ASSESSMENT:   Consult Assessment of nutrition requirement/status  ASSESSMENT:   63 yo male with PMH of type 1 DM, hepatitis C, drug abuse, diabetic retinopathy, R great toe amputation, HTN, vitiligo. Admitted with hyperglycemia (glucose 2500), DKA, hypothermia, and gastritis. Tox screens positive for cocaine and marijuana.   Reviewed I/O's: -525 ml x 24 hours and +9.4 L since admission  Per nursing notes, pt's appetite has greatly improved since last visit (prior intake documented at 10-60%).. Noted pt consumed 100% of both breakfast and lunch yesterday. Pt also has taken the last tow doses of Prostat, despite previously refusing doses the day before. He continues to be a very poor historian.  Albumin has a half-life of 21 days and is strongly affected by stress response and inflammatory process, therefore, do not expect to see an improvement in this lab value during acute hospitalization. When a patient presents with low albumin, it is likely skewed due to the acute inflammatory response.  Unless it is suspected that patient had poor PO intake or malnutrition prior to admission, then RD should not be consulted solely for low albumin. Note that low albumin is no longer used to diagnose malnutrition; [place] uses the new malnutrition  guidelines published by the American Society for Parenteral and Enteral Nutrition (A.S.P.E.N.) and the Academy of Nutrition and Dietetics (AND).    Per CSW notes, pt is medically stable for discharge, but awaiting SNF placement.   Labs reviewed: CBGS: 221-260 (inpatient orders for glycemic control are 1-5 units insulin aspart TID and q HS and 12 units insulin glargine q HS).  Diet Order:   Diet Order            Diet heart healthy/carb modified Room service appropriate? Yes; Fluid consistency: Thin  Diet effective now              EDUCATION NEEDS:   No education needs have been identified at this time  Skin:  Skin Assessment: Reviewed RN Assessment  Last BM:  09/01/18  Height:   Ht Readings from Last 1 Encounters:  08/28/18 6' (1.829 m)    Weight:   Wt Readings from Last 1 Encounters:  09/03/18 74.1 kg    Ideal Body Weight:  80.9 kg  BMI:  Body mass index is 22.16 kg/m.  Estimated Nutritional Needs:   Kcal:  2050-2250  Protein:  90-110 gm  Fluid:  2.2 L    Kamarius Buckbee A. Jimmye Norman, RD, LDN, [place] Registered Dietitian II Certified Diabetes Care and Education Specialist Pager: [phone] After hours Pager: [phone]

## 2018-09-05 ENCOUNTER — Inpatient Hospital Stay (HOSPITAL_COMMUNITY): Payer: Medicare HMO

## 2018-09-05 LAB — CBC WITH DIFFERENTIAL/PLATELET
Abs Immature Granulocytes: 0.07 10*3/uL (ref 0.00–0.07)
Basophils Absolute: 0.1 10*3/uL (ref 0.0–0.1)
Basophils Relative: 1 %
Eosinophils Absolute: 0.2 10*3/uL (ref 0.0–0.5)
Eosinophils Relative: 2 %
HCT: 21.9 % — ABNORMAL LOW (ref 39.0–52.0)
Hemoglobin: 7.1 g/dL — ABNORMAL LOW (ref 13.0–17.0)
Immature Granulocytes: 1 %
Lymphocytes Relative: 17 %
Lymphs Abs: 1.6 10*3/uL (ref 0.7–4.0)
MCH: 28.6 pg (ref 26.0–34.0)
MCHC: 32.4 g/dL (ref 30.0–36.0)
MCV: 88.3 fL (ref 80.0–100.0)
Monocytes Absolute: 1 10*3/uL (ref 0.1–1.0)
Monocytes Relative: 10 %
Neutro Abs: 6.6 10*3/uL (ref 1.7–7.7)
Neutrophils Relative %: 69 %
Platelets: 538 10*3/uL — ABNORMAL HIGH (ref 150–400)
RBC: 2.48 MIL/uL — ABNORMAL LOW (ref 4.22–5.81)
RDW: 13.9 % (ref 11.5–15.5)
WBC: 9.5 10*3/uL (ref 4.0–10.5)
nRBC: 0.2 % (ref 0.0–0.2)

## 2018-09-05 LAB — BASIC METABOLIC PANEL
Anion gap: 7 (ref 5–15)
BUN: 12 mg/dL (ref 8–23)
CO2: 27 mmol/L (ref 22–32)
Calcium: 8 mg/dL — ABNORMAL LOW (ref 8.9–10.3)
Chloride: 102 mmol/L (ref 98–111)
Creatinine, Ser: 1.29 mg/dL — ABNORMAL HIGH (ref 0.61–1.24)
GFR calc Af Amer: >60 mL/min (ref >60)
GFR calc non Af Amer: 59 mL/min — ABNORMAL LOW (ref >60)
Glucose, Bld: 196 mg/dL — ABNORMAL HIGH (ref 70–99)
Potassium: 3.8 mmol/L (ref 3.5–5.1)
Sodium: 136 mmol/L (ref 135–145)

## 2018-09-05 LAB — GLUCOSE, CAPILLARY
Glucose-Capillary: 151 mg/dL — ABNORMAL HIGH (ref 70–99)
Glucose-Capillary: 147 mg/dL — ABNORMAL HIGH (ref 70–99)
Glucose-Capillary: 212 mg/dL — ABNORMAL HIGH (ref 70–99)
Glucose-Capillary: 320 mg/dL — ABNORMAL HIGH (ref 70–99)

## 2018-09-05 LAB — MAGNESIUM: Magnesium: 1.8 mg/dL (ref 1.7–2.4)

## 2018-09-05 MED ORDER — INSULIN GLARGINE 100 UNIT/ML ~~LOC~~ SOLN
14.0000 [IU] | Freq: Every day | SUBCUTANEOUS | Status: DC
  Administered 2018-09-05: 14 [IU] via SUBCUTANEOUS
  Filled 2018-09-05 (×2): qty 0.14

## 2018-09-05 MED ORDER — SUCRALFATE 1 GM/10ML PO SUSP
1.0000 g | Freq: Three times a day (TID) | ORAL | Status: DC
  Administered 2018-09-06 – 2018-09-10 (×11): 1 g via ORAL
  Filled 2018-09-05 (×16): qty 10

## 2018-09-05 MED ORDER — FUROSEMIDE 40 MG PO TABS
40.0000 mg | ORAL_TABLET | Freq: Two times a day (BID) | ORAL | Status: DC
  Administered 2018-09-06 – 2018-09-10 (×8): 40 mg via ORAL
  Filled 2018-09-05 (×9): qty 1

## 2018-09-05 MED ORDER — AMOXICILLIN-POT CLAVULANATE 875-125 MG PO TABS
1.0000 | ORAL_TABLET | Freq: Two times a day (BID) | ORAL | Status: DC
  Administered 2018-09-05 – 2018-09-10 (×11): 1 via ORAL
  Filled 2018-09-05 (×11): qty 1

## 2018-09-05 MED ORDER — VITAMIN B-1 100 MG PO TABS
100.0000 mg | ORAL_TABLET | Freq: Every day | ORAL | Status: DC
  Administered 2018-09-09 – 2018-09-10 (×2): 100 mg via ORAL
  Filled 2018-09-05 (×6): qty 1

## 2018-09-05 MED ORDER — FOLIC ACID 1 MG PO TABS
1.0000 mg | ORAL_TABLET | Freq: Every day | ORAL | Status: DC
  Administered 2018-09-08 – 2018-09-10 (×3): 1 mg via ORAL
  Filled 2018-09-05 (×7): qty 1

## 2018-09-05 NOTE — Progress Notes (Signed)
Patient ID: [name], male   DOB: [birth year], 63 y.o.   MRN: [id number]  PROGRESS NOTE    [name]  KCM:[id number] DOB: 1956-01-10 DOA: 08/23/2018 PCP: Patient, No Pcp Per   Brief Narrative:  63 year old male with history of diabetes mellitus type 2, hypertension, cocaine/marijuana use presented to the hospital with acute metabolic encephalopathy in the setting of DKA/AKI and was found to have septic shock from pneumonia.  He was admitted to the ICU by PCCM and required intubation.  He also developed acute blood loss anemia from GI bleeding.  He underwent EGD on 08/25/2018.  He was subsequently extubated on 08/27/2018 and was transferred to [facility] on 08/29/2018.  He is currently awaiting transfer to SNF.  Assessment & Plan:   Principal Problem:   Hallucinations Active Problems:   DKA (diabetic ketoacidoses) ([name]  Acute metabolic encephalopathy Fluctuating mental status -Most likely from DKA/septic shock/respiratory failure/AKI -Monitor mental status.  Fall precautions -Mental status is still fluctuating.  High risk of falls.  Will DC gabapentin.  Will start a multivitamin, thiamine and folic acid.  Intermittent hallucinations -Patient has been having intermittent hallucinations as per nursing staff.  Monitor.   -Psychiatry evaluation appreciated.  Use Risperdal 0.5 mg twice a day as needed for psychosis.  Acute hypoxic respiratory failure Aspiration pneumonia -Intubated on admission and extubated on 08/27/2018. -Currently on room air -Has completed antibiotic course with Unasyn on 08/29/2018. -Sputum culture had shown rare Staphylococcus aureus and few Candida albicans. -COVID-19 testing was negative -Blood cultures have been negative so far -Had low-grade temperature overnight.  Will repeat blood cultures.  Chest x-ray shows worsening bilateral lower lobe pneumonia with small left parapneumonic effusion on 09/05/2018.  We will put him on Augmentin.  Will ask SLP to reevaluate.  Upper  GI bleeding Acute blood loss anemia -Underwent EGD on 08/25/2018 which showed severe esophagitis along with nonbleeding gastric ulcers. -Continue PPI twice a day and Carafate -Hemoglobin 7.1.  Transfuse if hemoglobin is less than 7. -GI is following peripherally and has started Diflucan for possible Candida esophagitis on 08/31/2018 -Outpatient follow-up with GI.  Hypertension -Blood pressure stable.  Continue amlodipine  Diabetes mellitus type 2 DKA: Present on admission.  Resolved -Blood sugars on the higher side.  Increase Lantus to 14 units at bedtime.  Continue CBGs with SSI.  Acute kidney injury -Resolved.  Monitor  Peripheral neuropathy  -DC Neurontin for now.  Cocaine/marijuana use -Patient was counseled regarding the same during this hospitalization  Generalized deconditioning -Patient is very deconditioned and will benefit from SNF on discharge to continue ongoing physical therapy.  Social worker following.  Patient has been difficult to be placed in a nursing home because of prior history of drug abuse. -Currently patient is at very high risk of falls and is not safe to be discharged home.  Still waiting for SNF placement.  Hypoalbuminemia -Follow nutrition recommendations  Dysphagia -From acute illness/debility and esophagitis -Diet as per SLP recommendations -Continue empiric Diflucan as per GI for total of 14-day course which was started on 08/31/2018    DVT prophylaxis: SCDs Code Status: Full Family Communication: None at bedside Disposition Plan: SNF once bed is available  Consultants: PCCM/GI  Procedures: EGD on 08/25/2018: Severe esophagitis with nonbleeding gastric ulcers Intubation on 08/23/2018/extubation on 08/27/2018 Right IJ central line 08/23/2018-08/28/2018  Antimicrobials:  Anti-infectives (From admission, onward)   Start     Dose/Rate Route Frequency Ordered Stop   09/01/18 1700  piperacillin-tazobactam (ZOSYN) IVPB 3.375 g  Status:   Discontinued     3.375 g 12.5 mL/hr over 240 Minutes Intravenous Every 8 hours 09/01/18 1607 09/02/18 1008   08/31/18 1700  fluconazole (DIFLUCAN) tablet 200 mg     200 mg Oral Daily 08/31/18 1646 09/14/18 0959   08/27/18 1200  Ampicillin-Sulbactam (UNASYN) 3 g in sodium chloride 0.9 % 100 mL IVPB     3 g 200 mL/hr over 30 Minutes Intravenous Every 6 hours 08/27/18 1033 08/29/18 1729   08/27/18 1152  Ampicillin-Sulbactam (UNASYN) 3 g in sodium chloride 0.9 % 100 mL IVPB  Status:  Discontinued     3 g 200 mL/hr over 30 Minutes Intravenous Every 6 hours 08/27/18 1032 08/27/18 1033   08/26/18 0600  Ampicillin-Sulbactam (UNASYN) 3 g in sodium chloride 0.9 % 100 mL IVPB  Status:  Discontinued     3 g 200 mL/hr over 30 Minutes Intravenous Every 8 hours 08/26/18 0052 08/27/18 1032   08/23/18 1700  Ampicillin-Sulbactam (UNASYN) 3 g in sodium chloride 0.9 % 100 mL IVPB  Status:  Discontinued     3 g 200 mL/hr over 30 Minutes Intravenous Every 12 hours 08/23/18 1657 08/26/18 0052         Subjective: Patient seen and examined sitting at the nursing station.  He is a very poor historian.  He is awake.  As per nursing staff, he was very restless overnight.    Objective: Vitals:   09/04/18 1350 09/04/18 1958 09/04/18 2221 09/04/18 2327  BP: 132/65 135/80 (!) 131/53 139/63  Pulse: 83 89 68 85  Resp: 16   17  Temp: 99.6 F (37.6 C) 98.8 F (37.1 C) (!) 100.9 F (38.3 C) 98.8 F (37.1 C)  TempSrc: Oral Oral Oral Oral  SpO2: 93% 93% 95% 90%  Weight:      Height:        Intake/Output Summary (Last 24 hours) at 09/05/2018 0959 Last data filed at 09/05/2018 0400 Gross per 24 hour  Intake 420 ml  Output 775 ml  Net -355 ml   Filed Weights   08/31/18 0610 09/01/18 0419 09/03/18 0619  Weight: 76.9 kg 73.8 kg 74.1 kg    Examination:  General exam: Appears calm and comfortable.  No distress.  Very poor historian.  Awake.   Respiratory system: Bilateral decreased breath sounds at bases  with some scattered crackles mostly in the bases  cardiovascular system: S1-S2 heard, rate controlled Gastrointestinal system: Abdomen is nondistended, soft and nontender. Normal bowel sounds heard. Extremities: No cyanosis, edema     Data Reviewed: I have personally reviewed following labs and imaging studies  CBC: Recent Labs  Lab 09/01/18 0246 09/02/18 0354 09/03/18 0243 09/04/18 0309 09/05/18 0333  WBC 11.1* 10.8* 9.6 9.8 9.5  NEUTROABS  --   --  6.6 6.7 6.6  HGB 7.8* 7.8* 7.4* 7.1* 7.1*  HCT 23.8* 23.6* 22.8* 21.6* 21.9*  MCV 88.5 88.7 89.4 87.8 88.3  PLT 380 430* 485* 515* 106*   Basic Metabolic Panel: Recent Labs  Lab 08/30/18 0804  09/01/18 0246 09/02/18 0354 09/03/18 0243 09/04/18 0309 09/05/18 0333  NA 142   < > 137 139 138 136 136  K 3.8   < > 3.6 3.7 3.8 4.2 3.8  CL 112*   < > 106 105 104 102 102  CO2 24   < > 25 26 28 26 27   GLUCOSE 226*   < > 154* 50* 159* 271* 196*  BUN 17   < > 16 15  14 14 12   CREATININE 1.34*   < > 1.24 1.31* 1.36* 1.37* 1.29*  CALCIUM 7.8*   < > 7.8* 7.9* 7.7* 7.9* 8.0*  MG 1.8  --   --   --  1.8 1.7 1.8   < > = values in this interval not displayed.   GFR: Estimated Creatinine Clearance: 63 mL/min (A) (by C-G formula based on SCr of 1.29 mg/dL (H)). Liver Function Tests: Recent Labs  Lab 09/04/18 0309  AST 19  ALT 22  ALKPHOS 210*  BILITOT 0.4  PROT 5.0*  ALBUMIN 1.4*   No results for input(s): LIPASE, AMYLASE in the last 168 hours. No results for input(s): AMMONIA in the last 168 hours. Coagulation Profile: No results for input(s): INR, PROTIME in the last 168 hours. Cardiac Enzymes: No results for input(s): CKTOTAL, CKMB, CKMBINDEX, TROPONINI in the last 168 hours. BNP (last 3 results) No results for input(s): PROBNP in the last 8760 hours. HbA1C: No results for input(s): HGBA1C in the last 72 hours. CBG: Recent Labs  Lab 09/04/18 0757 09/04/18 1134 09/04/18 1625 09/04/18 2320 09/05/18 0633  GLUCAP 221*  258* 245* 313* 151*   Lipid Profile: No results for input(s): CHOL, HDL, LDLCALC, TRIG, CHOLHDL, LDLDIRECT in the last 72 hours. Thyroid Function Tests: No results for input(s): TSH, T4TOTAL, FREET4, T3FREE, THYROIDAB in the last 72 hours. Anemia Panel: No results for input(s): VITAMINB12, FOLATE, FERRITIN, TIBC, IRON, RETICCTPCT in the last 72 hours. Sepsis Labs: No results for input(s): PROCALCITON, LATICACIDVEN in the last 168 hours.  Recent Results (from the past 240 hour(s))  Culture, blood (routine x 2)     Status: None (Preliminary result)   Collection Time: 09/01/18  1:40 PM  Result Value Ref Range Status   Specimen Description BLOOD RIGHT HAND  Final   Special Requests   Final    BOTTLES DRAWN AEROBIC ONLY Blood Culture adequate volume   Culture   Final    NO GROWTH 3 DAYS Performed at [facility], [address] [place], [place] [address]    Report Status PENDING  Incomplete  Culture, blood (routine x 2)     Status: None (Preliminary result)   Collection Time: 09/01/18  1:45 PM  Result Value Ref Range Status   Specimen Description BLOOD RIGHT HAND  Final   Special Requests   Final    BOTTLES DRAWN AEROBIC ONLY Blood Culture adequate volume   Culture   Final    NO GROWTH 3 DAYS Performed at [facility], [place] [address] [place], [place] [address]    Report Status PENDING  Incomplete         Radiology Studies: Dg Chest 2 View  Result Date: 09/05/2018 CLINICAL DATA:  63 year old male with history of severe diabetic ketoacidosis and hypothermia presenting with altered mental status and hypoxia. Aspiration pneumonia. EXAM: CHEST - 2 VIEW COMPARISON:  Chest x-ray 09/01/2018. FINDINGS: Worsening bilateral lower lobe airspace consolidation, compatible with progressive multilobar pneumonia. Small left pleural effusion. No right pleural effusion. No evidence of pulmonary edema. Heart size is normal. Upper mediastinal contours are within normal limits.  IMPRESSION: 1. Worsening bilateral lower lobe pneumonia with small left parapneumonic pleural effusion. Electronically Signed   By: Vinnie Langton M.D.   On: 09/05/2018 08:27        Scheduled Meds: . amLODipine  5 mg Oral Daily  . Chlorhexidine Gluconate Cloth  6 each Topical Q0600  . feeding supplement (PRO-STAT SUGAR FREE 64)  30  mL Oral BID BM  . fluconazole  200 mg Oral Daily  . insulin aspart  1-5 Units Subcutaneous TID AC & HS  . insulin glargine  14 Units Subcutaneous QHS  . multivitamin with minerals  1 tablet Oral Daily  . pantoprazole sodium  40 mg Oral BID  . sucralfate  1 g Oral TID WC & HS   Continuous Infusions:    LOS: 13 days        Aline August, MD [name] 09/05/2018, 9:59 AM

## 2018-09-05 NOTE — Progress Notes (Signed)
Pt sitting up at side of bed, low bed and floor mats in use. Sitter at bedside for safety: pt is impulsive and has poor awareness of his abilities. Had to encourage pt to take smaller sips of drink and bites of food. Pt verbalized concerns that staff was trying to poison him, but took most of his AM meds (refused ProStat and multivitamin). Will continue to monitor closely.

## 2018-09-05 NOTE — Progress Notes (Signed)
  Speech Language Pathology Treatment: Dysphagia  Patient Details Name: [name] MRN: [id number] DOB: 20-Apr-1955 Today's Date: 09/05/2018 Time: 2902-1115 SLP Time Calculation (min) (ACUTE ONLY): 27 min  Assessment / Plan / Recommendation Clinical Impression  Pt without overt s/s of aspiration with small supervised sips of liquids, but when pt exhibited impulsivity and/or unsupervised swallows, he either exhibited an immediate or delayed cough; esophageal component likely as nursing stated pt has been regurgitating food during meals with Dysphagia 3 consistency; impulsivity and esophageal dysphagia place pt at an increased risk for aspiration, so downgrading pt to Dysphagia 1 (puree)/thin with small sips with FULL supervision recommended for safety and reducing/eliminating aspiration risk during PO intake; medication whole in puree recommended and/or crushed in puree prn.  Pt did admit to taking large sips of liquids when consuming liquids independently during meals; education provided to pt regarding aspiration risk associated with not following swallowing precautions and pt stated "I will do what I want when I leave this place."  ST will f/u for diet tolerance/education re: safety while in acute setting.  HPI HPI: 63 y.o. male admitted on 08/23/18 for severe diabetic ketoacidosis hypothermia, AMS, hypoxia, elevated amonia, lactic acidosis, hyperkalemia, AKI, UGIB, and aspiration PNA. (+) cocaine and marijuana.  Intubated in the ED on 5/10 extubated 08/27/18.    Pt s/p EGD on 5/12 showing severe esophagitis.  Pt with other significant PMH of diabetic retinopathy, HTN, DM 1, peripheral vascular balloon angio, R toe amputation. MBS in 2019 showed intermittent aspiration of thin liquids with variable sensation.recent MBS on 08/28/18 indicated mild-moderate oropharyngeal dysphagia with aspiration prior to swallow d/t sensory impairment, but eliminated with smaller sips of liquids; decreased mastication and  bolus cohesion d/t poor dentition; recommended Dysphagia 3/thin with small sips/throat clearing prn      SLP Plan  Continue with current plan of care       Recommendations  Diet recommendations: Dysphagia 1 (puree);Thin liquid Liquids provided via: Cup Medication Administration: Whole meds with puree Supervision: Patient able to self feed;Full supervision/cueing for compensatory strategies Compensations: Slow rate;Small sips/bites;Clear throat intermittently Postural Changes and/or Swallow Maneuvers: Out of bed for meals;Seated upright 90 degrees                Oral Care Recommendations: Oral care BID Follow up Recommendations: None SLP Visit Diagnosis: Dysphagia, oropharyngeal phase (R13.12) Plan: Continue with current plan of care                      Elvina Sidle, M.S.,CCC-SLP 09/05/2018, 1:47 PM

## 2018-09-06 LAB — CULTURE, BLOOD (ROUTINE X 2)
Culture: NO GROWTH
Culture: NO GROWTH
Special Requests: ADEQUATE
Special Requests: ADEQUATE

## 2018-09-06 LAB — GLUCOSE, CAPILLARY
Glucose-Capillary: 257 mg/dL — ABNORMAL HIGH (ref 70–99)
Glucose-Capillary: 242 mg/dL — ABNORMAL HIGH (ref 70–99)
Glucose-Capillary: 282 mg/dL — ABNORMAL HIGH (ref 70–99)
Glucose-Capillary: 303 mg/dL — ABNORMAL HIGH (ref 70–99)

## 2018-09-06 MED ORDER — INSULIN ASPART 100 UNIT/ML ~~LOC~~ SOLN
3.0000 [IU] | Freq: Three times a day (TID) | SUBCUTANEOUS | Status: DC
  Administered 2018-09-06 – 2018-09-07 (×2): 3 [IU] via SUBCUTANEOUS

## 2018-09-06 MED ORDER — INSULIN GLARGINE 100 UNIT/ML ~~LOC~~ SOLN
16.0000 [IU] | Freq: Every day | SUBCUTANEOUS | Status: DC
  Administered 2018-09-06 – 2018-09-07 (×2): 16 [IU] via SUBCUTANEOUS
  Filled 2018-09-06 (×3): qty 0.16

## 2018-09-06 NOTE — Progress Notes (Signed)
Patient ID: ENOC GETTER, male   DOB: 1956-02-14, 63 y.o.   MRN: [id number]  PROGRESS NOTE    TOWNES FUHS  WUJ:[id number] DOB: 12-Jan-1956 DOA: 08/23/2018 PCP: Patient, No Pcp Per   Brief Narrative:  63 year old male with history of diabetes mellitus type 2, hypertension, cocaine/marijuana use presented to the hospital with acute metabolic encephalopathy in the setting of DKA/AKI and was found to have septic shock from pneumonia.  He was admitted to the ICU by PCCM and required intubation.  He also developed acute blood loss anemia from GI bleeding.  He underwent EGD on 08/25/2018.  He was subsequently extubated on 08/27/2018 and was transferred to [facility] on 08/29/2018.  He is currently awaiting transfer to SNF.  Assessment & Plan:   Principal Problem:   Hallucinations Active Problems:   DKA (diabetic ketoacidoses) ([name]  Acute metabolic encephalopathy Fluctuating mental status -Most likely from DKA/septic shock/respiratory failure/AKI -Monitor mental status.  Fall precautions -Mental status is still fluctuating.  High risk of falls.  Gabapentin discontinued on 09/05/2018.  Continue multivitamin, thiamine and folic acid.  Intermittent hallucinations -Patient has been having intermittent hallucinations as per nursing staff.  Monitor.   -Psychiatry evaluation appreciated.  Use Risperdal 0.5 mg twice a day as needed for psychosis.  Acute hypoxic respiratory failure Aspiration pneumonia -Intubated on admission and extubated on 08/27/2018. -Currently on room air -Had completed antibiotic course with Unasyn on 08/29/2018. -Sputum culture had shown rare Staphylococcus aureus and few Candida albicans. -COVID-19 testing was negative -Blood cultures have been negative so far -Patient was started on Augmentin on 09/05/2022 temperatures and Chest x-ray showing worsening bilateral lower lobe pneumonia with small left parapneumonic effusion.  Follow blood cultures. -Diet as per SLP recommendations.  Upper  GI bleeding Acute blood loss anemia -Underwent EGD on 08/25/2018 which showed severe esophagitis along with nonbleeding gastric ulcers. -Continue PPI twice a day and Carafate -Monitor H&H.  Transfuse if hemoglobin is less than 7. -GI is following peripherally and has started Diflucan for possible Candida esophagitis on 08/31/2018 -Outpatient follow-up with GI.  Hypertension -Blood pressure stable.  Continue amlodipine  Diabetes mellitus type 2 DKA: Present on admission.  Resolved -Blood sugars on the higher side.  Increase Lantus to 16 units at bedtime.  Add NovoLog with meals. continue CBGs with SSI.  Acute kidney injury -Resolved.  Monitor  Peripheral neuropathy  -Off Neurontin for now.  Cocaine/marijuana use -Patient was counseled regarding the same during this hospitalization  Generalized deconditioning -Patient is very deconditioned and will benefit from SNF on discharge to continue ongoing physical therapy.  Social worker following.  Patient has been difficult to be placed in a nursing home because of prior history of drug abuse. -Currently patient is at very high risk of falls and is not safe to be discharged home.  Still waiting for SNF placement.  Hypoalbuminemia -Follow nutrition recommendations  Dysphagia -From acute illness/debility and esophagitis -Diet as per SLP recommendations -Continue empiric Diflucan as per GI for total of 14-day course which was started on 08/31/2018    DVT prophylaxis: SCDs Code Status: Full Family Communication: None at bedside Disposition Plan: SNF once bed is available  Consultants: PCCM/GI  Procedures: EGD on 08/25/2018: Severe esophagitis with nonbleeding gastric ulcers Intubation on 08/23/2018/extubation on 08/27/2018 Right IJ central line 08/23/2018-08/28/2018  Antimicrobials:  Anti-infectives (From admission, onward)   Start     Dose/Rate Route Frequency Ordered Stop   09/05/18 1100  amoxicillin-clavulanate (AUGMENTIN) 875-125  MG per tablet 1 tablet  1 tablet Oral Every 12 hours 09/05/18 1017     09/01/18 1700  piperacillin-tazobactam (ZOSYN) IVPB 3.375 g  Status:  Discontinued     3.375 g 12.5 mL/hr over 240 Minutes Intravenous Every 8 hours 09/01/18 1607 09/02/18 1008   08/31/18 1700  fluconazole (DIFLUCAN) tablet 200 mg     200 mg Oral Daily 08/31/18 1646 09/14/18 0959   08/27/18 1200  Ampicillin-Sulbactam (UNASYN) 3 g in sodium chloride 0.9 % 100 mL IVPB     3 g 200 mL/hr over 30 Minutes Intravenous Every 6 hours 08/27/18 1033 08/29/18 1729   08/27/18 1152  Ampicillin-Sulbactam (UNASYN) 3 g in sodium chloride 0.9 % 100 mL IVPB  Status:  Discontinued     3 g 200 mL/hr over 30 Minutes Intravenous Every 6 hours 08/27/18 1032 08/27/18 1033   08/26/18 0600  Ampicillin-Sulbactam (UNASYN) 3 g in sodium chloride 0.9 % 100 mL IVPB  Status:  Discontinued     3 g 200 mL/hr over 30 Minutes Intravenous Every 8 hours 08/26/18 0052 08/27/18 1032   08/23/18 1700  Ampicillin-Sulbactam (UNASYN) 3 g in sodium chloride 0.9 % 100 mL IVPB  Status:  Discontinued     3 g 200 mL/hr over 30 Minutes Intravenous Every 12 hours 08/23/18 1657 08/26/18 0052       Subjective: Patient seen and examined sitting at the nursing station.  He is a very poor historian.  He is awake.  No overnight fever or vomiting.   Objective: Vitals:   09/05/18 1508 09/05/18 2241 09/05/18 2245 09/06/18 0547  BP: (!) 106/48 (!) 141/60  134/80  Pulse: 81 86  88  Resp: 18 15  16   Temp: 98.5 F (36.9 C) 99.1 F (37.3 C)  98.3 F (36.8 C)  TempSrc: Oral Oral  Oral  SpO2: 94% (!) 85% 92% 94%  Weight:      Height:        Intake/Output Summary (Last 24 hours) at 09/06/2018 0732 Last data filed at 09/06/2018 0616 Gross per 24 hour  Intake 240 ml  Output 530 ml  Net -290 ml   Filed Weights   08/31/18 0610 09/01/18 0419 09/03/18 0619  Weight: 76.9 kg 73.8 kg 74.1 kg    Examination:  General exam: Appears calm and comfortable.  No acute  distress.  Very poor historian.  Awake.   Respiratory system: Bilateral decreased breath sounds at bases with some scattered crackles mostly in the bases.  No wheezing cardiovascular system: S1-S2 heard, rate controlled Gastrointestinal system: Abdomen is nondistended, soft and nontender. Normal bowel sounds heard. Extremities: No cyanosis, edema     Data Reviewed: I have personally reviewed following labs and imaging studies  CBC: Recent Labs  Lab 09/01/18 0246 09/02/18 0354 09/03/18 0243 09/04/18 0309 09/05/18 0333  WBC 11.1* 10.8* 9.6 9.8 9.5  NEUTROABS  --   --  6.6 6.7 6.6  HGB 7.8* 7.8* 7.4* 7.1* 7.1*  HCT 23.8* 23.6* 22.8* 21.6* 21.9*  MCV 88.5 88.7 89.4 87.8 88.3  PLT 380 430* 485* 515* 086*   Basic Metabolic Panel: Recent Labs  Lab 08/30/18 0804  09/01/18 0246 09/02/18 0354 09/03/18 0243 09/04/18 0309 09/05/18 0333  NA 142   < > 137 139 138 136 136  K 3.8   < > 3.6 3.7 3.8 4.2 3.8  CL 112*   < > 106 105 104 102 102  CO2 24   < > 25 26 28 26 27   GLUCOSE 226*   < >  154* 50* 159* 271* 196*  BUN 17   < > 16 15 14 14 12   CREATININE 1.34*   < > 1.24 1.31* 1.36* 1.37* 1.29*  CALCIUM 7.8*   < > 7.8* 7.9* 7.7* 7.9* 8.0*  MG 1.8  --   --   --  1.8 1.7 1.8   < > = values in this interval not displayed.   GFR: Estimated Creatinine Clearance: 63 mL/min (A) (by C-G formula based on SCr of 1.29 mg/dL (H)). Liver Function Tests: Recent Labs  Lab 09/04/18 0309  AST 19  ALT 22  ALKPHOS 210*  BILITOT 0.4  PROT 5.0*  ALBUMIN 1.4*   No results for input(s): LIPASE, AMYLASE in the last 168 hours. No results for input(s): AMMONIA in the last 168 hours. Coagulation Profile: No results for input(s): INR, PROTIME in the last 168 hours. Cardiac Enzymes: No results for input(s): CKTOTAL, CKMB, CKMBINDEX, TROPONINI in the last 168 hours. BNP (last 3 results) No results for input(s): PROBNP in the last 8760 hours. HbA1C: No results for input(s): HGBA1C in the last 72  hours. CBG: Recent Labs  Lab 09/04/18 2320 09/05/18 0633 09/05/18 1200 09/05/18 1726 09/05/18 2236  GLUCAP 313* 151* 147* 212* 320*   Lipid Profile: No results for input(s): CHOL, HDL, LDLCALC, TRIG, CHOLHDL, LDLDIRECT in the last 72 hours. Thyroid Function Tests: No results for input(s): TSH, T4TOTAL, FREET4, T3FREE, THYROIDAB in the last 72 hours. Anemia Panel: No results for input(s): VITAMINB12, FOLATE, FERRITIN, TIBC, IRON, RETICCTPCT in the last 72 hours. Sepsis Labs: No results for input(s): PROCALCITON, LATICACIDVEN in the last 168 hours.  Recent Results (from the past 240 hour(s))  Culture, blood (routine x 2)     Status: None (Preliminary result)   Collection Time: 09/01/18  1:40 PM  Result Value Ref Range Status   Specimen Description BLOOD RIGHT HAND  Final   Special Requests   Final    BOTTLES DRAWN AEROBIC ONLY Blood Culture adequate volume   Culture   Final    NO GROWTH 4 DAYS Performed at [facility], [address] [place], [place] [address]    Report Status PENDING  Incomplete  Culture, blood (routine x 2)     Status: None (Preliminary result)   Collection Time: 09/01/18  1:45 PM  Result Value Ref Range Status   Specimen Description BLOOD RIGHT HAND  Final   Special Requests   Final    BOTTLES DRAWN AEROBIC ONLY Blood Culture adequate volume   Culture   Final    NO GROWTH 4 DAYS Performed at [facility], [place] [address] [place], [place] [address]    Report Status PENDING  Incomplete         Radiology Studies: Dg Chest 2 View  Result Date: 09/05/2018 CLINICAL DATA:  63 year old male with history of severe diabetic ketoacidosis and hypothermia presenting with altered mental status and hypoxia. Aspiration pneumonia. EXAM: CHEST - 2 VIEW COMPARISON:  Chest x-ray 09/01/2018. FINDINGS: Worsening bilateral lower lobe airspace consolidation, compatible with progressive multilobar pneumonia. Small left pleural effusion. No right pleural  effusion. No evidence of pulmonary edema. Heart size is normal. Upper mediastinal contours are within normal limits. IMPRESSION: 1. Worsening bilateral lower lobe pneumonia with small left parapneumonic pleural effusion. Electronically Signed   By: Vinnie Langton M.D.   On: 09/05/2018 08:27        Scheduled Meds: . amLODipine  5 mg Oral Daily  . amoxicillin-clavulanate  1 tablet  Oral Q12H  . Chlorhexidine Gluconate Cloth  6 each Topical Q0600  . feeding supplement (PRO-STAT SUGAR FREE 64)  30 mL Oral BID BM  . fluconazole  200 mg Oral Daily  . folic acid  1 mg Oral Daily  . furosemide  40 mg Oral BID  . insulin aspart  1-5 Units Subcutaneous TID AC & HS  . insulin glargine  14 Units Subcutaneous QHS  . multivitamin with minerals  1 tablet Oral Daily  . pantoprazole sodium  40 mg Oral BID  . sucralfate  1 g Oral TID WC & HS  . thiamine  100 mg Oral Daily   Continuous Infusions:    LOS: 14 days        Aline August, MD [name] 09/06/2018, 7:32 AM

## 2018-09-06 NOTE — Progress Notes (Signed)
Pt agreeable this AM; set up for breakfast and took most of his pills crushed in applesauce. Did not want vitamins. Bed alarm on, will monitor closely.

## 2018-09-06 NOTE — Plan of Care (Signed)
  Problem: Clinical Measurements: Goal: Ability to maintain clinical measurements within normal limits will improve Outcome: Progressing   Problem: Elimination: Goal: Will not experience complications related to bowel motility Outcome: Progressing Goal: Will not experience complications related to urinary retention Outcome: Progressing   Problem: Pain Managment: Goal: General experience of comfort will improve Outcome: Progressing   Problem: Skin Integrity: Goal: Risk for impaired skin integrity will decrease Outcome: Progressing   Problem: Education: Goal: Knowledge of General Education information will improve Description Including pain rating scale, medication(s)/side effects and non-pharmacologic comfort measures Outcome: Not Progressing   Problem: Health Behavior/Discharge Planning: Goal: Ability to manage health-related needs will improve Outcome: Not Progressing   Problem: Activity: Goal: Risk for activity intolerance will decrease Outcome: Not Progressing   Problem: Nutrition: Goal: Adequate nutrition will be maintained Outcome: Not Progressing   Problem: Coping: Goal: Level of anxiety will decrease Outcome: Not Progressing   Problem: Safety: Goal: Ability to remain free from injury will improve Outcome: Not Progressing

## 2018-09-07 LAB — AMMONIA: Ammonia: 22 umol/L (ref 9–35)

## 2018-09-07 LAB — CBC WITH DIFFERENTIAL/PLATELET
Abs Immature Granulocytes: 0.04 10*3/uL (ref 0.00–0.07)
Basophils Absolute: 0.1 10*3/uL (ref 0.0–0.1)
Basophils Relative: 1 %
Eosinophils Absolute: 0.3 10*3/uL (ref 0.0–0.5)
Eosinophils Relative: 4 %
HCT: 23.5 % — ABNORMAL LOW (ref 39.0–52.0)
Hemoglobin: 7.4 g/dL — ABNORMAL LOW (ref 13.0–17.0)
Immature Granulocytes: 1 %
Lymphocytes Relative: 19 %
Lymphs Abs: 1.4 10*3/uL (ref 0.7–4.0)
MCH: 27.7 pg (ref 26.0–34.0)
MCHC: 31.5 g/dL (ref 30.0–36.0)
MCV: 88 fL (ref 80.0–100.0)
Monocytes Absolute: 0.8 10*3/uL (ref 0.1–1.0)
Monocytes Relative: 11 %
Neutro Abs: 4.6 10*3/uL (ref 1.7–7.7)
Neutrophils Relative %: 64 %
Platelets: 608 10*3/uL — ABNORMAL HIGH (ref 150–400)
RBC: 2.67 MIL/uL — ABNORMAL LOW (ref 4.22–5.81)
RDW: 13.7 % (ref 11.5–15.5)
WBC: 7.2 10*3/uL (ref 4.0–10.5)
nRBC: 0 % (ref 0.0–0.2)

## 2018-09-07 LAB — COMPREHENSIVE METABOLIC PANEL
ALT: 23 U/L (ref 0–44)
AST: 30 U/L (ref 15–41)
Albumin: 1.5 g/dL — ABNORMAL LOW (ref 3.5–5.0)
Alkaline Phosphatase: 193 U/L — ABNORMAL HIGH (ref 38–126)
Anion gap: 4 — ABNORMAL LOW (ref 5–15)
BUN: 11 mg/dL (ref 8–23)
CO2: 32 mmol/L (ref 22–32)
Calcium: 8.2 mg/dL — ABNORMAL LOW (ref 8.9–10.3)
Chloride: 102 mmol/L (ref 98–111)
Creatinine, Ser: 1.12 mg/dL (ref 0.61–1.24)
GFR calc Af Amer: >60 mL/min (ref >60)
GFR calc non Af Amer: >60 mL/min (ref >60)
Glucose, Bld: 178 mg/dL — ABNORMAL HIGH (ref 70–99)
Potassium: 4 mmol/L (ref 3.5–5.1)
Sodium: 138 mmol/L (ref 135–145)
Total Bilirubin: 0.3 mg/dL (ref 0.3–1.2)
Total Protein: 5.3 g/dL — ABNORMAL LOW (ref 6.5–8.1)

## 2018-09-07 LAB — TSH: TSH: 2.616 u[IU]/mL (ref 0.350–4.500)

## 2018-09-07 LAB — GLUCOSE, CAPILLARY
Glucose-Capillary: 93 mg/dL (ref 70–99)
Glucose-Capillary: 155 mg/dL — ABNORMAL HIGH (ref 70–99)
Glucose-Capillary: 105 mg/dL — ABNORMAL HIGH (ref 70–99)
Glucose-Capillary: 241 mg/dL — ABNORMAL HIGH (ref 70–99)

## 2018-09-07 LAB — FOLATE: Folate: 9 ng/mL (ref >5.9)

## 2018-09-07 LAB — VITAMIN B12: Vitamin B-12: 783 pg/mL (ref 180–914)

## 2018-09-07 LAB — MAGNESIUM: Magnesium: 1.9 mg/dL (ref 1.7–2.4)

## 2018-09-07 NOTE — Progress Notes (Signed)
Patient ID: [name], male   DOB: [birth year], 63 y.o.   MRN: [id number]  PROGRESS NOTE    [name]  XFG:[id number] DOB: [birth year] DOA: 08/23/2018 PCP: Patient, No Pcp Per   Brief Narrative:  63 year old male with history of diabetes mellitus type 2, hypertension, cocaine/marijuana use presented to the hospital with acute metabolic encephalopathy in the setting of DKA/AKI and was found to have septic shock from pneumonia.  He was admitted to the ICU by PCCM and required intubation.  He also developed acute blood loss anemia from GI bleeding.  He underwent EGD on 08/25/2018.  He was subsequently extubated on 08/27/2018 and was transferred to [facility] on 08/29/2018.  He is currently awaiting transfer to SNF.  Assessment & Plan:   Principal Problem:   Hallucinations Active Problems:   DKA (diabetic ketoacidoses) ([name]  Acute metabolic encephalopathy Fluctuating mental status -Most likely from DKA/septic shock/respiratory failure/AKI -Monitor mental status.  Fall precautions -Mental status is still fluctuating.  High risk of falls.  Gabapentin discontinued on 09/05/2018.  Continue multivitamin, thiamine and folic acid.  Intermittent hallucinations -Patient has been having intermittent hallucinations as per nursing staff.  Monitor.   -Psychiatry evaluation appreciated.  Use Risperdal 0.5 mg twice a day as needed for psychosis.  Acute hypoxic respiratory failure Aspiration pneumonia -Intubated on admission and extubated on 08/27/2018. -Currently on room air -Had completed antibiotic course with Unasyn on 08/29/2018. -Sputum culture had shown rare Staphylococcus aureus and few Candida albicans. -COVID-19 testing was negative -Blood cultures have been negative so far -Patient was started on Augmentin on 09/05/2018 for temperatures and Chest x-ray showing worsening bilateral lower lobe pneumonia with small left parapneumonic effusion.  Blood cultures from 09/05/2018 negative so far.  He was started  on oral Lasix as well on 09/02/2018 which will be continued for now. -Diet as per SLP recommendations.  Upper GI bleeding Acute blood loss anemia -Underwent EGD on 08/25/2018 which showed severe esophagitis along with nonbleeding gastric ulcers. -Continue PPI twice a day and Carafate -Hemoglobin 7.4 today.  Transfuse if hemoglobin is less than 7. -GI is following peripherally and has started Diflucan for possible Candida esophagitis on 08/31/2018 -Outpatient follow-up with GI.  Hypertension -Blood pressure stable.  Continue amlodipine  Diabetes mellitus type 2 DKA: Present on admission.  Resolved -Blood sugars on the higher side.  Increase Lantus to 18 units at bedtime.  Increase NovoLog to 5 units 3 times daily with meals. continue CBGs with SSI.  Acute kidney injury -Resolved.  Monitor  Peripheral neuropathy  -Off Neurontin for now.  Cocaine/marijuana use -Patient was counseled regarding the same during this hospitalization  Generalized deconditioning -Patient is very deconditioned and will benefit from SNF on discharge to continue ongoing physical therapy.  Social worker following.  Patient has been difficult to be placed in a nursing home because of prior history of drug abuse. -Currently patient is at very high risk of falls and is not safe to be discharged home.  Still waiting for SNF placement.  Hypoalbuminemia -Follow nutrition recommendations  Dysphagia -From acute illness/debility and esophagitis -Diet as per SLP recommendations -Continue empiric Diflucan as per GI for total of 14-day course which was started on 08/31/2018    DVT prophylaxis: SCDs Code Status: Full Family Communication: None at bedside Disposition Plan: SNF once bed is available  Consultants: PCCM/GI  Procedures: EGD on 08/25/2018: Severe esophagitis with nonbleeding gastric ulcers Intubation on 08/23/2018/extubation on 08/27/2018 Right IJ central line 08/23/2018-08/28/2018  Antimicrobials:  Anti-infectives (From admission, onward)   Start     Dose/Rate Route Frequency Ordered Stop   09/05/18 1100  amoxicillin-clavulanate (AUGMENTIN) 875-125 MG per tablet 1 tablet     1 tablet Oral Every 12 hours 09/05/18 1017     09/01/18 1700  piperacillin-tazobactam (ZOSYN) IVPB 3.375 g  Status:  Discontinued     3.375 g 12.5 mL/hr over 240 Minutes Intravenous Every 8 hours 09/01/18 1607 09/02/18 1008   08/31/18 1700  fluconazole (DIFLUCAN) tablet 200 mg     200 mg Oral Daily 08/31/18 1646 09/14/18 0959   08/27/18 1200  Ampicillin-Sulbactam (UNASYN) 3 g in sodium chloride 0.9 % 100 mL IVPB     3 g 200 mL/hr over 30 Minutes Intravenous Every 6 hours 08/27/18 1033 08/29/18 1729   08/27/18 1152  Ampicillin-Sulbactam (UNASYN) 3 g in sodium chloride 0.9 % 100 mL IVPB  Status:  Discontinued     3 g 200 mL/hr over 30 Minutes Intravenous Every 6 hours 08/27/18 1032 08/27/18 1033   08/26/18 0600  Ampicillin-Sulbactam (UNASYN) 3 g in sodium chloride 0.9 % 100 mL IVPB  Status:  Discontinued     3 g 200 mL/hr over 30 Minutes Intravenous Every 8 hours 08/26/18 0052 08/27/18 1032   08/23/18 1700  Ampicillin-Sulbactam (UNASYN) 3 g in sodium chloride 0.9 % 100 mL IVPB  Status:  Discontinued     3 g 200 mL/hr over 30 Minutes Intravenous Every 12 hours 08/23/18 1657 08/26/18 0052        Subjective: Patient seen and examined sitting at the nursing station.  He is a very poor historian.  No overnight fever or vomiting reported by nursing staff.   Objective: Vitals:   09/06/18 0547 09/06/18 1447 09/06/18 2126 09/07/18 0551  BP: 134/80 (!) 120/44 135/60   Pulse: 88 77 72   Resp: 16 17 18    Temp: 98.3 F (36.8 C) 98.6 F (37 C) 98.4 F (36.9 C)   TempSrc: Oral Oral Oral   SpO2: 94% 91% 94%   Weight:    68.9 kg  Height:        Intake/Output Summary (Last 24 hours) at 09/07/2018 0734 Last data filed at 09/07/2018 0314 Gross per 24 hour  Intake --  Output 700 ml  Net -700 ml   Filed Weights    09/01/18 0419 09/03/18 0619 09/07/18 0551  Weight: 73.8 kg 74.1 kg 68.9 kg    Examination:  General exam: Appears calm and comfortable.  No distress.  Very poor historian.  Awake.  Answers some questions but mostly speaks sentences that do not make sense.   Respiratory system: Bilateral decreased breath sounds at bases with some scattered crackles. cardiovascular system: Rate controlled, S1-S2 heard  gastrointestinal system: Abdomen is nondistended, soft and nontender. Normal bowel sounds heard. Extremities: No cyanosis, edema     Data Reviewed: I have personally reviewed following labs and imaging studies  CBC: Recent Labs  Lab 09/02/18 0354 09/03/18 0243 09/04/18 0309 09/05/18 0333 09/07/18 0222  WBC 10.8* 9.6 9.8 9.5 7.2  NEUTROABS  --  6.6 6.7 6.6 4.6  HGB 7.8* 7.4* 7.1* 7.1* 7.4*  HCT 23.6* 22.8* 21.6* 21.9* 23.5*  MCV 88.7 89.4 87.8 88.3 88.0  PLT 430* 485* 515* 538* 762*   Basic Metabolic Panel: Recent Labs  Lab 09/02/18 0354 09/03/18 0243 09/04/18 0309 09/05/18 0333 09/07/18 0222  NA 139 138 136 136 138  K 3.7 3.8 4.2 3.8 4.0  CL 105 104 102 102 102  CO2 26 28 26 27  32  GLUCOSE 50* 159* 271* 196* 178*  BUN 15 14 14 12 11   CREATININE 1.31* 1.36* 1.37* 1.29* 1.12  CALCIUM 7.9* 7.7* 7.9* 8.0* 8.2*  MG  --  1.8 1.7 1.8 1.9   GFR: Estimated Creatinine Clearance: 67.5 mL/min (by C-G formula based on SCr of 1.12 mg/dL). Liver Function Tests: Recent Labs  Lab 09/04/18 0309 09/07/18 0222  AST 19 30  ALT 22 23  ALKPHOS 210* 193*  BILITOT 0.4 0.3  PROT 5.0* 5.3*  ALBUMIN 1.4* 1.5*   No results for input(s): LIPASE, AMYLASE in the last 168 hours. Recent Labs  Lab 09/07/18 0222  AMMONIA 22   Coagulation Profile: No results for input(s): INR, PROTIME in the last 168 hours. Cardiac Enzymes: No results for input(s): CKTOTAL, CKMB, CKMBINDEX, TROPONINI in the last 168 hours. BNP (last 3 results) No results for input(s): PROBNP in the last 8760  hours. HbA1C: No results for input(s): HGBA1C in the last 72 hours. CBG: Recent Labs  Lab 09/05/18 2236 09/06/18 0756 09/06/18 1158 09/06/18 1646 09/06/18 2158  GLUCAP 320* 257* 242* 282* 303*   Lipid Profile: No results for input(s): CHOL, HDL, LDLCALC, TRIG, CHOLHDL, LDLDIRECT in the last 72 hours. Thyroid Function Tests: Recent Labs    09/07/18 0222  TSH 2.616   Anemia Panel: Recent Labs    09/07/18 0222  VITAMINB12 783  FOLATE 9.0   Sepsis Labs: No results for input(s): PROCALCITON, LATICACIDVEN in the last 168 hours.  Recent Results (from the past 240 hour(s))  Culture, blood (routine x 2)     Status: None   Collection Time: 09/01/18  1:40 PM  Result Value Ref Range Status   Specimen Description BLOOD RIGHT HAND  Final   Special Requests   Final    BOTTLES DRAWN AEROBIC ONLY Blood Culture adequate volume   Culture   Final    NO GROWTH 5 DAYS Performed at [facility], [address] [place], [place] [address]    Report Status 09/06/2018 FINAL  Final  Culture, blood (routine x 2)     Status: None   Collection Time: 09/01/18  1:45 PM  Result Value Ref Range Status   Specimen Description BLOOD RIGHT HAND  Final   Special Requests   Final    BOTTLES DRAWN AEROBIC ONLY Blood Culture adequate volume   Culture   Final    NO GROWTH 5 DAYS Performed at [facility], [place] [address], [place], [place] [address]    Report Status 09/06/2018 FINAL  Final  Culture, blood (routine x 2)     Status: None (Preliminary result)   Collection Time: 09/05/18  8:58 AM  Result Value Ref Range Status   Specimen Description BLOOD BLOOD LEFT HAND  Final   Special Requests AEROBIC BOTTLE ONLY Blood Culture adequate volume  Final   Culture   Final    NO GROWTH 1 DAY Performed at [facility], [place] [address] [place], [place] [address]    Report Status PENDING  Incomplete  Culture, blood (routine x 2)     Status: None (Preliminary result)   Collection Time:  09/05/18  9:03 AM  Result Value Ref Range Status   Specimen Description BLOOD RIGHT ANTECUBITAL  Final   Special Requests AEROBIC BOTTLE ONLY Blood Culture adequate volume  Final   Culture   Final    NO GROWTH 1 DAY Performed at [facility], [place]  [address], [place], [place] [address]    Report Status PENDING  Incomplete         Radiology Studies: Dg Chest 2 View  Result Date: 09/05/2018 CLINICAL DATA:  63 year old male with history of severe diabetic ketoacidosis and hypothermia presenting with altered mental status and hypoxia. Aspiration pneumonia. EXAM: CHEST - 2 VIEW COMPARISON:  Chest x-ray 09/01/2018. FINDINGS: Worsening bilateral lower lobe airspace consolidation, compatible with progressive multilobar pneumonia. Small left pleural effusion. No right pleural effusion. No evidence of pulmonary edema. Heart size is normal. Upper mediastinal contours are within normal limits. IMPRESSION: 1. Worsening bilateral lower lobe pneumonia with small left parapneumonic pleural effusion. Electronically Signed   By: Vinnie Langton M.D.   On: 09/05/2018 08:27        Scheduled Meds:  amLODipine  5 mg Oral Daily   amoxicillin-clavulanate  1 tablet Oral Q12H   Chlorhexidine Gluconate Cloth  6 each Topical Q0600   feeding supplement (PRO-STAT SUGAR FREE 64)  30 mL Oral BID BM   fluconazole  200 mg Oral Daily   folic acid  1 mg Oral Daily   furosemide  40 mg Oral BID   insulin aspart  1-5 Units Subcutaneous TID AC & HS   insulin aspart  3 Units Subcutaneous TID WC   insulin glargine  16 Units Subcutaneous QHS   multivitamin with minerals  1 tablet Oral Daily   pantoprazole sodium  40 mg Oral BID   sucralfate  1 g Oral TID WC & HS   thiamine  100 mg Oral Daily   Continuous Infusions:    LOS: 15 days        Aline August, MD [name] 09/07/2018, 7:34 AM

## 2018-09-07 NOTE — Care Management Important Message (Signed)
Important Message  Patient Details  Name: [name] MRN: [id number] Date of Birth: Nov 02, 1955   Medicare Important Message Given:  Yes    Parissa Chiao [address] 09/07/2018, 3:36 PM

## 2018-09-08 DIAGNOSIS — E162 Hypoglycemia, unspecified: Secondary | ICD-10-CM

## 2018-09-08 LAB — CBC WITH DIFFERENTIAL/PLATELET
Abs Immature Granulocytes: 0.06 10*3/uL (ref 0.00–0.07)
Basophils Absolute: 0.1 10*3/uL (ref 0.0–0.1)
Basophils Relative: 2 %
Eosinophils Absolute: 0.3 10*3/uL (ref 0.0–0.5)
Eosinophils Relative: 4 %
HCT: 24 % — ABNORMAL LOW (ref 39.0–52.0)
Hemoglobin: 7.6 g/dL — ABNORMAL LOW (ref 13.0–17.0)
Immature Granulocytes: 1 %
Lymphocytes Relative: 22 %
Lymphs Abs: 1.4 10*3/uL (ref 0.7–4.0)
MCH: 28 pg (ref 26.0–34.0)
MCHC: 31.7 g/dL (ref 30.0–36.0)
MCV: 88.6 fL (ref 80.0–100.0)
Monocytes Absolute: 0.7 10*3/uL (ref 0.1–1.0)
Monocytes Relative: 10 %
Neutro Abs: 4 10*3/uL (ref 1.7–7.7)
Neutrophils Relative %: 61 %
Platelets: 612 10*3/uL — ABNORMAL HIGH (ref 150–400)
RBC: 2.71 MIL/uL — ABNORMAL LOW (ref 4.22–5.81)
RDW: 13.6 % (ref 11.5–15.5)
WBC: 6.5 10*3/uL (ref 4.0–10.5)
nRBC: 0 % (ref 0.0–0.2)

## 2018-09-08 LAB — GLUCOSE, CAPILLARY
Glucose-Capillary: 39 mg/dL — CL (ref 70–99)
Glucose-Capillary: 181 mg/dL — ABNORMAL HIGH (ref 70–99)
Glucose-Capillary: 136 mg/dL — ABNORMAL HIGH (ref 70–99)
Glucose-Capillary: 389 mg/dL — ABNORMAL HIGH (ref 70–99)
Glucose-Capillary: 403 mg/dL — ABNORMAL HIGH (ref 70–99)
Glucose-Capillary: 400 mg/dL — ABNORMAL HIGH (ref 70–99)
Glucose-Capillary: 258 mg/dL — ABNORMAL HIGH (ref 70–99)

## 2018-09-08 LAB — BASIC METABOLIC PANEL
Anion gap: 8 (ref 5–15)
BUN: 12 mg/dL (ref 8–23)
CO2: 27 mmol/L (ref 22–32)
Calcium: 8.5 mg/dL — ABNORMAL LOW (ref 8.9–10.3)
Chloride: 103 mmol/L (ref 98–111)
Creatinine, Ser: 1.16 mg/dL (ref 0.61–1.24)
GFR calc Af Amer: >60 mL/min (ref >60)
GFR calc non Af Amer: >60 mL/min (ref >60)
Glucose, Bld: 72 mg/dL (ref 70–99)
Potassium: 3.8 mmol/L (ref 3.5–5.1)
Sodium: 138 mmol/L (ref 135–145)

## 2018-09-08 LAB — MAGNESIUM: Magnesium: 1.9 mg/dL (ref 1.7–2.4)

## 2018-09-08 MED ORDER — INSULIN GLARGINE 100 UNIT/ML ~~LOC~~ SOLN
8.0000 [IU] | Freq: Every day | SUBCUTANEOUS | Status: DC
  Filled 2018-09-08: qty 0.08

## 2018-09-08 MED ORDER — INSULIN ASPART 100 UNIT/ML ~~LOC~~ SOLN
2.0000 [IU] | Freq: Three times a day (TID) | SUBCUTANEOUS | Status: DC
  Administered 2018-09-08 – 2018-09-10 (×4): 2 [IU] via SUBCUTANEOUS

## 2018-09-08 MED ORDER — DEXTROSE 50 % IV SOLN
INTRAVENOUS | Status: AC
  Administered 2018-09-08: 09:00:00 50 mL
  Filled 2018-09-08: qty 50

## 2018-09-08 MED ORDER — INSULIN GLARGINE 100 UNIT/ML ~~LOC~~ SOLN
12.0000 [IU] | Freq: Every day | SUBCUTANEOUS | Status: DC
  Administered 2018-09-08 – 2018-09-09 (×2): 12 [IU] via SUBCUTANEOUS
  Filled 2018-09-08 (×4): qty 0.12

## 2018-09-08 MED ORDER — INSULIN ASPART 100 UNIT/ML ~~LOC~~ SOLN
5.0000 [IU] | Freq: Once | SUBCUTANEOUS | Status: AC
  Administered 2018-09-08: 17:00:00 5 [IU] via SUBCUTANEOUS

## 2018-09-08 NOTE — Progress Notes (Signed)
[facility] consulted for PIV placement. Upon arrival at pt's bedside, noted pt with a sitter and learned of history of pulling out IV's. Pt currently does not have IVF's or IV meds ordered. Spoke with unit RN and educated about vein preservation. RN worried because pt has had large swings with his blood sugars. Educated unit RN if patient has drastic change in status and needs an IV for meds to call VAST or place STAT IV team consult with reason emergency IV needed. Also educated if a Rapid Response called, the SWOT RN can place an IV and VAST responds to all codes. Unit RN verbalized understanding.

## 2018-09-08 NOTE — Progress Notes (Signed)
Occupational Therapy Treatment Patient Details Name: [name] MRN: [id number] DOB: [birth year] Today's Date: 09/08/2018    History of present illness 63 y.o. male admitted on 08/23/18 for severe diabetic ketoacidosis hypothermia, AMS, hypoxia, elevated amonia, lactic acidosis, hyperkalemia, AKI, UGIB, and aspiration PNA. (+) cocaine and marijuana.  Intubated in the ED on 5/10 extubated 08/27/18.    Pt s/p EGD on 08/25/18.  Pt with other significant PMH of diabetic retinopathy, HTN, DM 1, peripheral vascular balloon angio, R toe amputation.    OT comments  Pt progressing towards acute OT goals. Focus of session was toilet transfer, LB dressing, and functional mobility while navigating obstacles in the room. Pt alert, impulsive, decreased safety awareness, tangential at times. Struggled with managing LB dressing (sequencing?), attempted with cues but pt ultimately needing assist to manage fasteners and positioning. D/c plan remains appropriate.    Follow Up Recommendations  SNF;Supervision/Assistance - 24 hour    Equipment Recommendations  None recommended by OT    Recommendations for Other Services      Precautions / Restrictions Precautions Precautions: Fall Precaution Comments: last admission note had him using a RW, very weak Restrictions Weight Bearing Restrictions: No       Mobility Bed Mobility Overal bed mobility: Needs Assistance Bed Mobility: Supine to Sit;Sit to Supine Rolling: Min guard   Supine to sit: HOB elevated;Supervision Sit to supine: Min guard   General bed mobility comments: use of rail and HOB elevated; supervision for safety  Transfers Overall transfer level: Needs assistance Equipment used: Rolling walker (2 wheeled) Transfers: Sit to/from Stand Sit to Stand: Min assist;Min guard Stand pivot transfers: Min guard       General transfer comment: assist to steady, cues for managment of rw in bathroom    Balance Overall balance assessment: Needs  assistance Sitting-balance support: Feet supported;No upper extremity supported Sitting balance-Leahy Scale: Good Sitting balance - Comments: no LOB with donning socks   Standing balance support: Bilateral upper extremity supported;During functional activity Standing balance-Leahy Scale: Poor Standing balance comment: requires bilat UE on RW and external support                           ADL either performed or assessed with clinical judgement   ADL Overall ADL's : Needs assistance/impaired                         Toilet Transfer: Moderate assistance;Ambulation;RW   Toileting- Clothing Manipulation and Hygiene: Moderate assistance;Sit to/from stand         General ADL Comments: Pt ambulatd to bathroom, cues for safety. difficulty managing LB dressing and fasteners (sequencing?)     Vision       Perception     Praxis      Cognition Arousal/Alertness: Awake/alert Behavior During Therapy: Flat affect;Impulsive Overall Cognitive Status: No family/caregiver present to determine baseline cognitive functioning Area of Impairment: Following commands;Safety/judgement;Problem solving;Memory                 Orientation Level: Disoriented to;Time;Situation Current Attention Level: Selective Memory: Decreased short-term memory Following Commands: Follows one step commands with increased time Safety/Judgement: Decreased awareness of safety;Decreased awareness of deficits Awareness: Emergent Problem Solving: Difficulty sequencing;Decreased initiation;Requires verbal cues General Comments: Impulsive, decreased safety awareness. tangential         Exercises     Shoulder Instructions       General Comments Pt reports dizziness in standing which  improved with sitting. VSS.     Pertinent Vitals/ Pain       Faces Pain Scale: No hurt Pain Location: reports numbness/pain at B feet - neuropathy Pain Descriptors / Indicators: Burning;Discomfort  Home  Living                                          Prior Functioning/Environment              Frequency  Min 2X/week        Progress Toward Goals  OT Goals(current goals can now be found in the care plan section)  Progress towards OT goals: Progressing toward goals  Acute Rehab OT Goals Patient Stated Goal: to go home OT Goal Formulation: With patient Time For Goal Achievement: 09/11/18 Potential to Achieve Goals: Good ADL Goals Pt Will Perform Grooming: with modified independence;sitting Pt Will Perform Upper Body Dressing: with modified independence;sitting Pt Will Perform Lower Body Dressing: with set-up;sitting/lateral leans;sit to/from stand Pt Will Transfer to Toilet: with modified independence;ambulating Pt Will Perform Toileting - Clothing Manipulation and hygiene: with modified independence;sit to/from stand Pt Will Perform Tub/Shower Transfer: with supervision Additional ADL Goal #1: Pt will perform ADL tasks at sink x5 mins with supervisionA and fair balance.  Plan Discharge plan remains appropriate    Co-evaluation                 AM-PAC OT "6 Clicks" Daily Activity     Outcome Measure   Help from another person eating meals?: None Help from another person taking care of personal grooming?: A Little Help from another person toileting, which includes using toliet, bedpan, or urinal?: A Little Help from another person bathing (including washing, rinsing, drying)?: A Little Help from another person to put on and taking off regular upper body clothing?: A Little Help from another person to put on and taking off regular lower body clothing?: A Little 6 Click Score: 19    End of Session Equipment Utilized During Treatment: Gait belt;Rolling walker  OT Visit Diagnosis: Muscle weakness (generalized) (M62.81);Unsteadiness on feet (R26.81)   Activity Tolerance Patient tolerated treatment well   Patient Left in bed;with call bell/phone  within reach;with nursing/sitter in room   Nurse Communication          Time: 1208-1219 OT Time Calculation (min): 11 min  Charges: OT General Charges $OT Visit: 1 Visit OT Treatments $Self Care/Home Management : 8-22 mins  Tyrone Schimke, OT Acute Rehabilitation Services Pager: [phone] Office: [phone]    [name] 09/08/2018, 1:46 PM

## 2018-09-08 NOTE — Progress Notes (Signed)
Patient ID: [name], male   DOB: 03-13-1956, 63 y.o.   MRN: [id number]  PROGRESS NOTE    [name]  RDE:[id number] DOB: 12-02-1955 DOA: 08/23/2018 PCP: Patient, No Pcp Per   Brief Narrative:  63 year old male with history of diabetes mellitus type 2, hypertension, cocaine/marijuana use presented to the hospital with acute metabolic encephalopathy in the setting of DKA/AKI and was found to have septic shock from pneumonia.  He was admitted to the ICU by PCCM and required intubation.  He also developed acute blood loss anemia from GI bleeding.  He underwent EGD on 08/25/2018.  He was subsequently extubated on 08/27/2018 and was transferred to [facility] on 08/29/2018.  He is currently awaiting transfer to SNF.  Assessment & Plan:   Principal Problem:   Hallucinations Active Problems:   DKA (diabetic ketoacidoses) ([name]  Acute metabolic encephalopathy Fluctuating mental status -Most likely from DKA/septic shock/respiratory failure/AKI -Monitor mental status.  Fall precautions -Mental status is still fluctuating.  High risk of falls.  Gabapentin discontinued on 09/05/2018.  Continue multivitamin, thiamine and folic acid. -More confused this morning most likely from hypoglycemia.  Patient is refusing to eat this morning.  Intermittent hallucinations -Patient has been having intermittent hallucinations as per nursing staff.  Monitor.   -Psychiatry evaluation appreciated.  Use Risperdal 0.5 mg twice a day as needed for psychosis. -Currently not hallucinating but mental status has not improved recently.  Might need psychiatry to reevaluate the patient.  Acute hypoxic respiratory failure Aspiration pneumonia -Intubated on admission and extubated on 08/27/2018. -Currently on room air -Had completed antibiotic course with Unasyn on 08/29/2018. -Sputum culture had shown rare Staphylococcus aureus and few Candida albicans. -COVID-19 testing was negative -Blood cultures have been negative so far  -Patient was started on Augmentin on 09/05/2018 for temperatures and Chest x-ray showing worsening bilateral lower lobe pneumonia with small left parapneumonic effusion.  Blood cultures from 09/05/2018 negative so far.  He was started on oral Lasix as well on 09/02/2018 which will be continued for now.  Repeat chest x-ray in a.m.  Afebrile for the last more than 24 hours. -Diet as per SLP recommendations.  Upper GI bleeding Acute blood loss anemia -Underwent EGD on 08/25/2018 which showed severe esophagitis along with nonbleeding gastric ulcers. -Continue PPI twice a day and Carafate -Hemoglobin 7.6 today.  Transfuse if hemoglobin is less than 7. -GI is following peripherally and has started Diflucan for possible Candida esophagitis on 08/31/2018 -Outpatient follow-up with GI.  Hypertension -Blood pressure stable.  Continue amlodipine  Diabetes mellitus type 2 DKA: Present on admission.  Resolved Intermittent hyperglycemia and hypoglycemia -Blood sugars still fluctuating.  He is hypoglycemic this morning with slightly more confusion.  Will decrease Levemir to 8 units at bedtime.  Will request diabetes coordinator to evaluate the patient as well.  Acute kidney injury -Resolved.  Monitor  Peripheral neuropathy  -Off Neurontin for now.  Cocaine/marijuana use -Patient was counseled regarding the same during this hospitalization  Generalized deconditioning -Patient is very deconditioned and will benefit from SNF on discharge to continue ongoing physical therapy.  Social worker following.  Patient has been difficult to be placed in a nursing home because of prior history of drug abuse. -Currently patient is at very high risk of falls and is not safe to be discharged home.  Still waiting for SNF placement.  Hypoalbuminemia -Follow nutrition recommendations  Dysphagia -From acute illness/debility and esophagitis -Diet as per SLP recommendations -Continue empiric Diflucan as per GI for total  of 14-day course which was started on 08/31/2018    DVT prophylaxis: SCDs Code Status: Full Family Communication: None at bedside Disposition Plan: SNF once bed is available  Consultants: PCCM/GI  Procedures: EGD on 08/25/2018: Severe esophagitis with nonbleeding gastric ulcers Intubation on 08/23/2018/extubation on 08/27/2018 Right IJ central line 08/23/2018-08/28/2018  Antimicrobials:  Anti-infectives (From admission, onward)   Start     Dose/Rate Route Frequency Ordered Stop   09/05/18 1100  amoxicillin-clavulanate (AUGMENTIN) 875-125 MG per tablet 1 tablet     1 tablet Oral Every 12 hours 09/05/18 1017     09/01/18 1700  piperacillin-tazobactam (ZOSYN) IVPB 3.375 g  Status:  Discontinued     3.375 g 12.5 mL/hr over 240 Minutes Intravenous Every 8 hours 09/01/18 1607 09/02/18 1008   08/31/18 1700  fluconazole (DIFLUCAN) tablet 200 mg     200 mg Oral Daily 08/31/18 1646 09/14/18 0959   08/27/18 1200  Ampicillin-Sulbactam (UNASYN) 3 g in sodium chloride 0.9 % 100 mL IVPB     3 g 200 mL/hr over 30 Minutes Intravenous Every 6 hours 08/27/18 1033 08/29/18 1729   08/27/18 1152  Ampicillin-Sulbactam (UNASYN) 3 g in sodium chloride 0.9 % 100 mL IVPB  Status:  Discontinued     3 g 200 mL/hr over 30 Minutes Intravenous Every 6 hours 08/27/18 1032 08/27/18 1033   08/26/18 0600  Ampicillin-Sulbactam (UNASYN) 3 g in sodium chloride 0.9 % 100 mL IVPB  Status:  Discontinued     3 g 200 mL/hr over 30 Minutes Intravenous Every 8 hours 08/26/18 0052 08/27/18 1032   08/23/18 1700  Ampicillin-Sulbactam (UNASYN) 3 g in sodium chloride 0.9 % 100 mL IVPB  Status:  Discontinued     3 g 200 mL/hr over 30 Minutes Intravenous Every 12 hours 08/23/18 1657 08/26/18 0052      Subjective: Patient seen and examined at bedside.  He is a very poor historian.  He is sleepy, wakes up slightly on calling his name.  Does not want to eat his breakfast.  Patient is more confused as per nursing staff.  Objective:  Vitals:   09/07/18 0816 09/07/18 1555 09/07/18 2129 09/08/18 0450  BP: (!) 142/58 (!) 109/36 137/64 132/60  Pulse:  78 78 68  Resp:  20 20 18   Temp:  98.6 F (37 C) 98.5 F (36.9 C) 98.6 F (37 C)  TempSrc:  Oral  Oral  SpO2:  96% 100% 92%  Weight:      Height:        Intake/Output Summary (Last 24 hours) at 09/08/2018 0725 Last data filed at 09/07/2018 2100 Gross per 24 hour  Intake 430 ml  Output 551 ml  Net -121 ml   Filed Weights   09/01/18 0419 09/03/18 0619 09/07/18 0551  Weight: 73.8 kg 74.1 kg 68.9 kg    Examination:  General exam: Appears calm and comfortable.  No acute distress.  Very poor historian.  Sleepy, wakes up slightly.  Does not want to engage in conversation and hardly answers any questions.   Respiratory system: Bilateral decreased breath sounds at bases with some scattered crackles.  No wheezing cardiovascular system: S1-S2 heard, rate controlled gastrointestinal system: Abdomen is nondistended, soft and nontender. Normal bowel sounds heard. Extremities: No cyanosis, edema     Data Reviewed: I have personally reviewed following labs and imaging studies  CBC: Recent Labs  Lab 09/03/18 0243 09/04/18 0309 09/05/18 0333 09/07/18 0222 09/08/18 0230  WBC 9.6 9.8 9.5 7.2 6.5  NEUTROABS 6.6  6.7 6.6 4.6 4.0  HGB 7.4* 7.1* 7.1* 7.4* 7.6*  HCT 22.8* 21.6* 21.9* 23.5* 24.0*  MCV 89.4 87.8 88.3 88.0 88.6  PLT 485* 515* 538* 608* 196*   Basic Metabolic Panel: Recent Labs  Lab 09/03/18 0243 09/04/18 0309 09/05/18 0333 09/07/18 0222 09/08/18 0230  NA 138 136 136 138 138  K 3.8 4.2 3.8 4.0 3.8  CL 104 102 102 102 103  CO2 28 26 27  32 27  GLUCOSE 159* 271* 196* 178* 72  BUN 14 14 12 11 12   CREATININE 1.36* 1.37* 1.29* 1.12 1.16  CALCIUM 7.7* 7.9* 8.0* 8.2* 8.5*  MG 1.8 1.7 1.8 1.9 1.9   GFR: Estimated Creatinine Clearance: 65.2 mL/min (by C-G formula based on SCr of 1.16 mg/dL). Liver Function Tests: Recent Labs  Lab 09/04/18 0309  09/07/18 0222  AST 19 30  ALT 22 23  ALKPHOS 210* 193*  BILITOT 0.4 0.3  PROT 5.0* 5.3*  ALBUMIN 1.4* 1.5*   No results for input(s): LIPASE, AMYLASE in the last 168 hours. Recent Labs  Lab 09/07/18 0222  AMMONIA 22   Coagulation Profile: No results for input(s): INR, PROTIME in the last 168 hours. Cardiac Enzymes: No results for input(s): CKTOTAL, CKMB, CKMBINDEX, TROPONINI in the last 168 hours. BNP (last 3 results) No results for input(s): PROBNP in the last 8760 hours. HbA1C: No results for input(s): HGBA1C in the last 72 hours. CBG: Recent Labs  Lab 09/06/18 2158 09/07/18 0806 09/07/18 1258 09/07/18 1647 09/07/18 2016  GLUCAP 303* 93 155* 105* 241*   Lipid Profile: No results for input(s): CHOL, HDL, LDLCALC, TRIG, CHOLHDL, LDLDIRECT in the last 72 hours. Thyroid Function Tests: Recent Labs    09/07/18 0222  TSH 2.616   Anemia Panel: Recent Labs    09/07/18 0222  VITAMINB12 783  FOLATE 9.0   Sepsis Labs: No results for input(s): PROCALCITON, LATICACIDVEN in the last 168 hours.  Recent Results (from the past 240 hour(s))  Culture, blood (routine x 2)     Status: None   Collection Time: 09/01/18  1:40 PM  Result Value Ref Range Status   Specimen Description BLOOD RIGHT HAND  Final   Special Requests   Final    BOTTLES DRAWN AEROBIC ONLY Blood Culture adequate volume   Culture   Final    NO GROWTH 5 DAYS Performed at [facility], [address] [place], [place] [address]    Report Status 09/06/2018 FINAL  Final  Culture, blood (routine x 2)     Status: None   Collection Time: 09/01/18  1:45 PM  Result Value Ref Range Status   Specimen Description BLOOD RIGHT HAND  Final   Special Requests   Final    BOTTLES DRAWN AEROBIC ONLY Blood Culture adequate volume   Culture   Final    NO GROWTH 5 DAYS Performed at [facility], [place] [address] [place], [place] [address]    Report Status 09/06/2018 FINAL  Final  Culture, blood (routine x  2)     Status: None (Preliminary result)   Collection Time: 09/05/18  8:58 AM  Result Value Ref Range Status   Specimen Description BLOOD BLOOD LEFT HAND  Final   Special Requests AEROBIC BOTTLE ONLY Blood Culture adequate volume  Final   Culture   Final    NO GROWTH 2 DAYS Performed at [facility], [place] [address], [place], [place] [address]    Report Status PENDING  Incomplete  Culture, blood (routine x 2)     Status: None (Preliminary result)   Collection Time: 09/05/18  9:03 AM  Result Value Ref Range Status   Specimen Description BLOOD RIGHT ANTECUBITAL  Final   Special Requests AEROBIC BOTTLE ONLY Blood Culture adequate volume  Final   Culture   Final    NO GROWTH 2 DAYS Performed at [facility], [address] [place], [place] [address]    Report Status PENDING  Incomplete         Radiology Studies: No results found.      Scheduled Meds: . amLODipine  5 mg Oral Daily  . amoxicillin-clavulanate  1 tablet Oral Q12H  . Chlorhexidine Gluconate Cloth  6 each Topical Q0600  . feeding supplement (PRO-STAT SUGAR FREE 64)  30 mL Oral BID BM  . fluconazole  200 mg Oral Daily  . folic acid  1 mg Oral Daily  . furosemide  40 mg Oral BID  . insulin aspart  1-5 Units Subcutaneous TID AC & HS  . insulin glargine  16 Units Subcutaneous QHS  . multivitamin with minerals  1 tablet Oral Daily  . pantoprazole sodium  40 mg Oral BID  . sucralfate  1 g Oral TID WC & HS  . thiamine  100 mg Oral Daily   Continuous Infusions:    LOS: 16 days        Aline August, MD [name] 09/08/2018, 7:25 AM

## 2018-09-08 NOTE — Progress Notes (Signed)
Inpatient Diabetes Program Recommendations  AACE/ADA: New Consensus Statement on Inpatient Glycemic Control (2015)  Target Ranges:  Prepandial:   less than 140 mg/dL      Peak postprandial:   less than 180 mg/dL (1-2 hours)      Critically ill patients:  140 - 180 mg/dL   Lab Results  Component Value Date   GLUCAP 136 (H) 09/08/2018   HGBA1C 9.8 (H) 08/13/2018     Results for [name], [name] (MRN [id number]) as of 09/08/2018 13:20  Ref. Range 09/06/2018 21:58 09/07/2018 08:06 09/07/2018 12:58 09/07/2018 16:47 09/07/2018 20:16 09/08/2018 08:40 09/08/2018 09:58 09/08/2018 11:37  Glucose-Capillary Latest Ref Range: 70 - 99 mg/dL 303 (H) 93 155 (H) 105 (H) 241 (H) 39 (LL) 181 (H) 136 (H)     Review of Glycemic Control  Diabetes history: DM 1 Patient does not make ANY insulin so will need basal, correction, and meal coverage  Outpatient Diabetes medications:  70/30 15 units am + 10 units pm  Current orders for Inpatient glycemic control:  Lantus 8 units QHS (first dose tonight - received 16 units last night) Novolog 1-5 units tid with meals   Diabetic consult noted today for "brittle diabetes and hyperglycemia/hypoglycemia".  Patient received Lantus 16 units past two nights and CBG dropped 303 to 93 two nights ago and 241 to 39 last night. MD decreased Lantus to 8 units for tonight and may not be enough basal (patient has history of hyperglycemia as well as hypo). Patient appears quite sensitive to insulin (is on custom Novolog (1-5 unit correction scale). Spoke to bedside RN and she stated patient doesn't like [name] pureed diet and is mostly drinking water today.   MD please consider [name] following inpatient insulin adjustments:  1. Increase Lantus to 12 units QHS  2. Add Novolog 2 units meal coverage tid (HOLD if NPO or eats< 50%)  -- Will follow during hospitalization.--  Jonna Clark RN, MSN Diabetes Coordinator Inpatient Glycemic Control Team Team Pager: [phone] (8am-5pm)

## 2018-09-08 NOTE — Progress Notes (Signed)
Physical Therapy Treatment Patient Details Name: [name] MRN: [id number] DOB: 1955/12/20 Today's Date: 09/08/2018    History of Present Illness 63 y.o. male admitted on 08/23/18 for severe diabetic ketoacidosis hypothermia, AMS, hypoxia, elevated amonia, lactic acidosis, hyperkalemia, AKI, UGIB, and aspiration PNA. (+) cocaine and marijuana.  Intubated in the ED on 5/10 extubated 08/27/18.    Pt s/p EGD on 08/25/18.  Pt with other significant PMH of diabetic retinopathy, HTN, DM 1, peripheral vascular balloon angio, R toe amputation.     PT Comments    Patient seen for mobility progression. Pt agreeable to participate in therapy and is oriented to self only at time of session. Pt requires min guard/min A for mobility with use of RW.  Pt demonstrates generalized weakness, decreased safety awareness, and is tangential during session. Continue to progress as tolerated.    Follow Up Recommendations  SNF     Equipment Recommendations  Rolling walker with 5" wheels    Recommendations for Other Services       Precautions / Restrictions Precautions Precautions: Fall Precaution Comments: last admission note had him using a RW, very weak Restrictions Weight Bearing Restrictions: No    Mobility  Bed Mobility Overal bed mobility: Needs Assistance Bed Mobility: Supine to Sit;Sit to Supine Rolling: Min guard   Supine to sit: HOB elevated;Supervision Sit to supine: Min guard   General bed mobility comments: use of rail and HOB elevated; supervision for safety  Transfers Overall transfer level: Needs assistance Equipment used: Rolling walker (2 wheeled) Transfers: Sit to/from Stand Sit to Stand: Min assist;Min guard         General transfer comment: assist to steady, cues for managment of rw in bathroom  Ambulation/Gait Ambulation/Gait assistance: Min assist;Mod assist Gait Distance (Feet): 100 Feet Assistive device: Rolling walker (2 wheeled) Gait Pattern/deviations:  Step-through pattern;Decreased stride length;Decreased dorsiflexion - right;Decreased dorsiflexion - left;Scissoring;Narrow base of support Gait velocity: decreased   General Gait Details: cues for posture and sequencing; pt tends to stay very close to front of RW; bilat LE weakness and poor coordination; assist to steady    Stairs             Wheelchair Mobility    Modified Rankin (Stroke Patients Only)       Balance Overall balance assessment: Needs assistance Sitting-balance support: Feet supported;No upper extremity supported Sitting balance-Leahy Scale: Good     Standing balance support: Bilateral upper extremity supported;During functional activity Standing balance-Leahy Scale: Poor Standing balance comment: requires bilat UE on RW and external support                            Cognition Arousal/Alertness: Awake/alert Behavior During Therapy: Flat affect;Impulsive Overall Cognitive Status: No family/caregiver present to determine baseline cognitive functioning Area of Impairment: Following commands;Safety/judgement;Problem solving;Memory                 Orientation Level: Disoriented to;Time;Situation;Place Current Attention Level: Selective Memory: Decreased short-term memory Following Commands: Follows one step commands with increased time Safety/Judgement: Decreased awareness of safety;Decreased awareness of deficits Awareness: Emergent Problem Solving: Difficulty sequencing;Decreased initiation;Requires verbal cues General Comments: Impulsive, decreased safety awareness. tangential       Exercises      General Comments General comments (skin integrity, edema, etc.): Pt reports dizziness in standing which improved with sitting. VSS.       Pertinent Vitals/Pain Pain Assessment: Faces Faces Pain Scale: No hurt Pain Location: knees Pain  Descriptors / Indicators: Sore Pain Intervention(s): Limited activity within patient's  tolerance;Monitored during session;Repositioned    Home Living                      Prior Function            PT Goals (current goals can now be found in the care plan section) Acute Rehab PT Goals Patient Stated Goal: to go home Progress towards PT goals: Progressing toward goals    Frequency    Min 3X/week      PT Plan Current plan remains appropriate    Co-evaluation              AM-PAC PT "6 Clicks" Mobility   Outcome Measure  Help needed turning from your back to your side while in a flat bed without using bedrails?: A Little Help needed moving from lying on your back to sitting on the side of a flat bed without using bedrails?: A Little Help needed moving to and from a bed to a chair (including a wheelchair)?: A Little Help needed standing up from a chair using your arms (e.g., wheelchair or bedside chair)?: A Little Help needed to walk in hospital room?: A Little Help needed climbing 3-5 steps with a railing? : A Lot 6 Click Score: 17    End of Session Equipment Utilized During Treatment: Gait belt Activity Tolerance: Patient tolerated treatment well Patient left: with call bell/phone within reach;with nursing/sitter in room;Other (comment)(pt sitting EOB with OT and sitter present) Nurse Communication: Mobility status PT Visit Diagnosis: Muscle weakness (generalized) (M62.81);Difficulty in walking, not elsewhere classified (R26.2)     Time: 0347-4259 PT Time Calculation (min) (ACUTE ONLY): 25 min  Charges:  $Gait Training: 23-37 mins                     Earney Navy, PTA Acute Rehabilitation Services Pager: [phone] Office: [phone]     Darliss Cheney 09/08/2018, 2:04 PM

## 2018-09-09 DIAGNOSIS — R4182 Altered mental status, unspecified: Secondary | ICD-10-CM

## 2018-09-09 DIAGNOSIS — E43 Unspecified severe protein-calorie malnutrition: Secondary | ICD-10-CM

## 2018-09-09 DIAGNOSIS — T68XXXA Hypothermia, initial encounter: Secondary | ICD-10-CM

## 2018-09-09 DIAGNOSIS — E440 Moderate protein-calorie malnutrition: Secondary | ICD-10-CM

## 2018-09-09 DIAGNOSIS — E8809 Other disorders of plasma-protein metabolism, not elsewhere classified: Secondary | ICD-10-CM

## 2018-09-09 LAB — BASIC METABOLIC PANEL
Anion gap: 11 (ref 5–15)
BUN: 21 mg/dL (ref 8–23)
CO2: 22 mmol/L (ref 22–32)
Calcium: 8.3 mg/dL — ABNORMAL LOW (ref 8.9–10.3)
Chloride: 103 mmol/L (ref 98–111)
Creatinine, Ser: 1.5 mg/dL — ABNORMAL HIGH (ref 0.61–1.24)
GFR calc Af Amer: 57 mL/min — ABNORMAL LOW (ref >60)
GFR calc non Af Amer: 50 mL/min — ABNORMAL LOW (ref >60)
Glucose, Bld: 193 mg/dL — ABNORMAL HIGH (ref 70–99)
Potassium: 4.3 mmol/L (ref 3.5–5.1)
Sodium: 136 mmol/L (ref 135–145)

## 2018-09-09 LAB — CBC WITH DIFFERENTIAL/PLATELET
Abs Immature Granulocytes: 0.05 10*3/uL (ref 0.00–0.07)
Basophils Absolute: 0.1 10*3/uL (ref 0.0–0.1)
Basophils Relative: 2 %
Eosinophils Absolute: 0.2 10*3/uL (ref 0.0–0.5)
Eosinophils Relative: 4 %
HCT: 24.2 % — ABNORMAL LOW (ref 39.0–52.0)
Hemoglobin: 8 g/dL — ABNORMAL LOW (ref 13.0–17.0)
Immature Granulocytes: 1 %
Lymphocytes Relative: 30 %
Lymphs Abs: 1.9 10*3/uL (ref 0.7–4.0)
MCH: 28.6 pg (ref 26.0–34.0)
MCHC: 33.1 g/dL (ref 30.0–36.0)
MCV: 86.4 fL (ref 80.0–100.0)
Monocytes Absolute: 0.8 10*3/uL (ref 0.1–1.0)
Monocytes Relative: 12 %
Neutro Abs: 3.1 10*3/uL (ref 1.7–7.7)
Neutrophils Relative %: 51 %
Platelets: 531 10*3/uL — ABNORMAL HIGH (ref 150–400)
RBC: 2.8 MIL/uL — ABNORMAL LOW (ref 4.22–5.81)
RDW: 13.6 % (ref 11.5–15.5)
WBC: 6.2 10*3/uL (ref 4.0–10.5)
nRBC: 0 % (ref 0.0–0.2)

## 2018-09-09 LAB — GLUCOSE, CAPILLARY
Glucose-Capillary: 184 mg/dL — ABNORMAL HIGH (ref 70–99)
Glucose-Capillary: 180 mg/dL — ABNORMAL HIGH (ref 70–99)
Glucose-Capillary: 146 mg/dL — ABNORMAL HIGH (ref 70–99)
Glucose-Capillary: 51 mg/dL — ABNORMAL LOW (ref 70–99)
Glucose-Capillary: 188 mg/dL — ABNORMAL HIGH (ref 70–99)
Glucose-Capillary: 173 mg/dL — ABNORMAL HIGH (ref 70–99)

## 2018-09-09 LAB — MAGNESIUM: Magnesium: 1.9 mg/dL (ref 1.7–2.4)

## 2018-09-09 MED ORDER — GLUCERNA SHAKE PO LIQD
237.0000 mL | Freq: Three times a day (TID) | ORAL | Status: DC
  Administered 2018-09-09 (×2): 237 mL via ORAL

## 2018-09-09 MED ORDER — PREGABALIN 25 MG PO CAPS
50.0000 mg | ORAL_CAPSULE | Freq: Two times a day (BID) | ORAL | Status: DC
  Administered 2018-09-09 – 2018-09-10 (×3): 50 mg via ORAL
  Filled 2018-09-09 (×3): qty 2

## 2018-09-09 MED ORDER — ADULT MULTIVITAMIN LIQUID CH
15.0000 mL | Freq: Every day | ORAL | Status: DC
  Administered 2018-09-10: 15 mL via ORAL
  Filled 2018-09-09 (×2): qty 15

## 2018-09-09 NOTE — Progress Notes (Addendum)
Patient ID: [name], male   DOB: [birth year], 63 y.o.   MRN: [id number]  PROGRESS NOTE    TENNIS MCKINNON  HYQ:[id number] DOB: 1955-07-12 DOA: 08/23/2018 PCP: Patient, No Pcp Per   Brief Narrative:  63 year old male with history of diabetes mellitus type 2, hypertension, cocaine/marijuana use presented to the hospital with acute metabolic encephalopathy in the setting of DKA/AKI and was found to have septic shock from pneumonia.  He was admitted to the ICU by PCCM and required intubation.  He also developed acute blood loss anemia from GI bleeding.  He underwent EGD on 08/25/2018.  He was subsequently extubated on 08/27/2018 and was transferred to [facility] on 08/29/2018.  He is currently awaiting transfer to SNF.  Assessment & Plan:   Principal Problem:   Hallucinations Active Problems:   DKA (diabetic ketoacidoses) ([name]  Acute metabolic encephalopathy Fluctuating mental status -Most likely from DKA/septic shock/respiratory failure/AKI -Mental status is still fluctuating.  High risk of falls. Continue multivitamin, thiamine and folic acid. -improved mentation today.  Intermittent hallucinations -Patient has been having intermittent hallucinations as per nursing staff.  Monitor.   -Psychiatry evaluation appreciated. Will continue the using Risperdal 0.5 mg twice a day as needed for psychosis. -Currently not hallucinating and with overall improved mentation.  Acute hypoxic respiratory failure Aspiration pneumonia -Intubated on admission and extubated on 08/27/2018. -Currently on room air -Had completed antibiotic course with Unasyn on 08/29/2018. -Sputum culture had shown rare Staphylococcus aureus and few Candida albicans. -COVID-19 testing was negative -Blood cultures have been negative so far -Patient was started on Augmentin on 09/05/2018 for temperatures and Chest x-ray showing worsening bilateral lower lobe pneumonia with small left parapneumonic effusion.   -Blood cultures from 09/05/2018  negative so far.  He was started on oral Lasix as well on 09/02/2018 which will be continued for now.  -Diet as per SLP recommendations.  Upper GI bleeding Acute blood loss anemia -Underwent EGD on 08/25/2018 which showed severe esophagitis along with nonbleeding gastric ulcers. -Continue PPI twice a day and Carafate as recommended by GI service. -Hemoglobin 8.0. -Continue to follow hemoglobin trend intermittently. -Transfer for transfusion is a hemoglobin less than 7. -GI service has also recommended the use of Diflucan for 14 days in order to treat underlying Candida esophagitis.  Hypertension -Blood pressure stable and well-controlled -Continue amlodipine  Diabetes mellitus type 2 DKA: Present on admission.  Now Resolved -Intermittent hyperglycemia and hypoglycemia, base on decrease oral intake. -Blood sugars still fluctuating.   -Follow CBGs and continue adjusting hypoglycemic regimen as needed.  Acute kidney injury -Resolved.   -Monitoring BMET intermittently   Peripheral neuropathy  -will add low dose lyrica -follow neuropathy symptoms.  Cocaine/marijuana use -cessation counseling provided. -patient expressed motivation to quit.   Generalized deconditioning -Patient is very deconditioned and will benefit from SNF on discharge to continue ongoing physical therapy.  Social worker following.  Patient has been difficult to be placed in a nursing home because of prior history of drug abuse. -Currently patient remains at very high risk of falls and is not safe to be discharged home.   -Still waiting for SNF placement.  Hypoalbuminemia/moderate protein calorie malnutrition  -Continue feeding supplements and follow nutritional service recommendations  Dysphagia -From acute illness/debility and esophagitis -Diet as per SLP recommendations -Continue empiric Diflucan as per GI for total of 14-day course which was started on 08/31/2018 (anticipated Diflucan last day 09/14/2018).     DVT prophylaxis: SCDs Code Status: Full Family Communication: None at bedside  Disposition Plan: SNF once bed is available  Consultants: PCCM/GI  Procedures: EGD on 08/25/2018: Severe esophagitis with nonbleeding gastric ulcers Intubation on 08/23/2018/extubation on 08/27/2018 Right IJ central line 08/23/2018-08/28/2018  Antimicrobials:  Anti-infectives (From admission, onward)   Start     Dose/Rate Route Frequency Ordered Stop   09/05/18 1100  amoxicillin-clavulanate (AUGMENTIN) 875-125 MG per tablet 1 tablet     1 tablet Oral Every 12 hours 09/05/18 1017     09/01/18 1700  piperacillin-tazobactam (ZOSYN) IVPB 3.375 g  Status:  Discontinued     3.375 g 12.5 mL/hr over 240 Minutes Intravenous Every 8 hours 09/01/18 1607 09/02/18 1008   08/31/18 1700  fluconazole (DIFLUCAN) tablet 200 mg     200 mg Oral Daily 08/31/18 1646 09/14/18 0959   08/27/18 1200  Ampicillin-Sulbactam (UNASYN) 3 g in sodium chloride 0.9 % 100 mL IVPB     3 g 200 mL/hr over 30 Minutes Intravenous Every 6 hours 08/27/18 1033 08/29/18 1729   08/27/18 1152  Ampicillin-Sulbactam (UNASYN) 3 g in sodium chloride 0.9 % 100 mL IVPB  Status:  Discontinued     3 g 200 mL/hr over 30 Minutes Intravenous Every 6 hours 08/27/18 1032 08/27/18 1033   08/26/18 0600  Ampicillin-Sulbactam (UNASYN) 3 g in sodium chloride 0.9 % 100 mL IVPB  Status:  Discontinued     3 g 200 mL/hr over 30 Minutes Intravenous Every 8 hours 08/26/18 0052 08/27/18 1032   08/23/18 1700  Ampicillin-Sulbactam (UNASYN) 3 g in sodium chloride 0.9 % 100 mL IVPB  Status:  Discontinued     3 g 200 mL/hr over 30 Minutes Intravenous Every 12 hours 08/23/18 1657 08/26/18 0052      Subjective: Afebrile, no chest pain, no nausea, no vomiting.  Reports appetite is slowly improving.  Still weak and deconditioned.   Objective: Vitals:   09/09/18 0330 09/09/18 0414 09/09/18 0819 09/09/18 1122  BP:  129/82 132/61 (!) 125/55  Pulse:  76 73 78  Resp:  16 20   Temp:   98.6 F (37 C) 97.9 F (36.6 C) 98.3 F (36.8 C)  TempSrc:  Oral Oral Oral  SpO2:  98% 93% 95%  Weight: 72 kg     Height:        Intake/Output Summary (Last 24 hours) at 09/09/2018 1324 Last data filed at 09/09/2018 0833 Gross per 24 hour  Intake 780 ml  Output -  Net 780 ml   Filed Weights   09/03/18 0619 09/07/18 0551 09/09/18 0330  Weight: 74.1 kg 68.9 kg 72 kg    Examination: General exam: Alert, awake, oriented x 2; no chest pain, no nausea, no vomiting.  Reports appetite slowly improving.  Still weak and deconditioned.  Reports ongoing neuropathy in his legs. Respiratory system: No using accessory muscles, good oxygen saturation on room air.  Positive scattered rhonchi.  No wheezing.   Cardiovascular system:RRR.  Positive systolic ejection murmur appreciated on exam; no rubs, no gallops, no JVD.   Gastrointestinal system: Abdomen is nondistended, soft and nontender. No organomegaly or masses felt. Normal bowel sounds heard. Central nervous system: Alert and oriented. No focal neurological deficits. Extremities: No C/C/E, +pedal pulses Skin: No rashes, lesions or ulcers Psychiatry: Judgement and insight appear normal. Mood & affect appropriate.    Data Reviewed: I have personally reviewed following labs and imaging studies  CBC: Recent Labs  Lab 09/04/18 0309 09/05/18 0333 09/07/18 0222 09/08/18 0230 09/09/18 0325  WBC 9.8 9.5 7.2 6.5 6.2  NEUTROABS  6.7 6.6 4.6 4.0 3.1  HGB 7.1* 7.1* 7.4* 7.6* 8.0*  HCT 21.6* 21.9* 23.5* 24.0* 24.2*  MCV 87.8 88.3 88.0 88.6 86.4  PLT 515* 538* 608* 612* 242*   Basic Metabolic Panel: Recent Labs  Lab 09/04/18 0309 09/05/18 0333 09/07/18 0222 09/08/18 0230 09/09/18 0325  NA 136 136 138 138 136  K 4.2 3.8 4.0 3.8 4.3  CL 102 102 102 103 103  CO2 26 27 32 27 22  GLUCOSE 271* 196* 178* 72 193*  BUN 14 12 11 12 21   CREATININE 1.37* 1.29* 1.12 1.16 1.50*  CALCIUM 7.9* 8.0* 8.2* 8.5* 8.3*  MG 1.7 1.8 1.9 1.9 1.9   GFR:  Estimated Creatinine Clearance: 52.7 mL/min (A) (by C-G formula based on SCr of 1.5 mg/dL (H)).   Liver Function Tests: Recent Labs  Lab 09/04/18 0309 09/07/18 0222  AST 19 30  ALT 22 23  ALKPHOS 210* 193*  BILITOT 0.4 0.3  PROT 5.0* 5.3*  ALBUMIN 1.4* 1.5*   Recent Labs  Lab 09/07/18 0222  AMMONIA 22   CBG: Recent Labs  Lab 09/08/18 2327 09/09/18 0153 09/09/18 0413 09/09/18 0743 09/09/18 1117  GLUCAP 258* 184* 180* 146* 51*   Thyroid Function Tests: Recent Labs    09/07/18 0222  TSH 2.616   Anemia Panel: Recent Labs    09/07/18 0222  VITAMINB12 783  FOLATE 9.0   Recent Results (from the past 240 hour(s))  Culture, blood (routine x 2)     Status: None   Collection Time: 09/01/18  1:40 PM  Result Value Ref Range Status   Specimen Description BLOOD RIGHT HAND  Final   Special Requests   Final    BOTTLES DRAWN AEROBIC ONLY Blood Culture adequate volume   Culture   Final    NO GROWTH 5 DAYS Performed at [facility], [place] [address] [place], [place] [address]    Report Status 09/06/2018 FINAL  Final  Culture, blood (routine x 2)     Status: None   Collection Time: 09/01/18  1:45 PM  Result Value Ref Range Status   Specimen Description BLOOD RIGHT HAND  Final   Special Requests   Final    BOTTLES DRAWN AEROBIC ONLY Blood Culture adequate volume   Culture   Final    NO GROWTH 5 DAYS Performed at [facility], [place] [address], [place], [place] [address]    Report Status 09/06/2018 FINAL  Final  Culture, blood (routine x 2)     Status: None (Preliminary result)   Collection Time: 09/05/18  8:58 AM  Result Value Ref Range Status   Specimen Description BLOOD BLOOD LEFT HAND  Final   Special Requests AEROBIC BOTTLE ONLY Blood Culture adequate volume  Final   Culture   Final    NO GROWTH 4 DAYS Performed at [facility], [place] [address], [place], [place] [address]    Report Status PENDING  Incomplete  Culture, blood (routine x 2)      Status: None (Preliminary result)   Collection Time: 09/05/18  9:03 AM  Result Value Ref Range Status   Specimen Description BLOOD RIGHT ANTECUBITAL  Final   Special Requests AEROBIC BOTTLE ONLY Blood Culture adequate volume  Final   Culture   Final    NO GROWTH 4 DAYS Performed at [facility], [place] [address] [place], [place] [address]    Report Status PENDING  Incomplete     Radiology Studies:  No results found.   Scheduled Meds: . amLODipine  5 mg Oral Daily  . amoxicillin-clavulanate  1 tablet Oral Q12H  . Chlorhexidine Gluconate Cloth  6 each Topical Q0600  . feeding supplement (GLUCERNA SHAKE)  237 mL Oral TID BM  . fluconazole  200 mg Oral Daily  . folic acid  1 mg Oral Daily  . furosemide  40 mg Oral BID  . insulin aspart  1-5 Units Subcutaneous TID AC & HS  . insulin aspart  2 Units Subcutaneous TID WC  . insulin glargine  12 Units Subcutaneous QHS  . multivitamin  15 mL Oral Daily  . pantoprazole sodium  40 mg Oral BID  . sucralfate  1 g Oral TID WC & HS  . thiamine  100 mg Oral Daily   Continuous Infusions:    LOS: 17 days    Barton Dubois, MD [phone] [facility]  09/09/2018, 1:24 PM

## 2018-09-09 NOTE — Progress Notes (Signed)
Nutrition Follow-up  DOCUMENTATION CODES:   Not applicable  INTERVENTION:   -D/c 30 ml Prostat BID, each supplement provides 100 kcals and 15 grams protein -Continue Magic Cup TID with meals, each supplement provides 290 kcals and 9 grams protein -D/c MVI with minerals daily -15 ml liquid MVI daily -Glucerna Shake po TID, each supplement provides 220 kcal and 10 grams of protein  NUTRITION DIAGNOSIS:   Inadequate oral intake related to decreased appetite(esophagitis) as evidenced by per patient/family report.  Ongoing  GOAL:   Patient will meet greater than or equal to 90% of their needs  Progressing   MONITOR:   PO intake, Supplement acceptance, Diet advancement, Labs, Weight trends, Skin, I & O's  REASON FOR ASSESSMENT:   Consult Assessment of nutrition requirement/status  ASSESSMENT:   63 yo male with PMH of type 1 DM, hepatitis C, drug abuse, diabetic retinopathy, R great toe amputation, HTN, vitiligo. Admitted with hyperglycemia (glucose 2500), DKA, hypothermia, and gastritis. Tox screens positive for cocaine and marijuana.   5/14- extubated; s/p BSE- recommend NPO; s/p MBSS- advanced to dysphagia 3 diet with thin liquids, transferred from ICU to PCU 5/18- GI added diflucan for for esophageal candidiasis (14 day couse) 5/23- downgraded diet to dysphagia 1 with thin liquids due to impulsivity  Reviewed I/O's: +660 ml x 24 hours and -2.2 L since 08/26/18  Pt with safety sitter. He continues to be impulsive. Per chart review, pt does not like pureed diet and drank mostly water yesterday. Intake has decreased; PO: 10-80, averaging around 50% meal completion. He has been refusing MVI and Prostat supplements.  Pt awaiting SNF placement.   Labs reviewed: CBGS: 180-258 (inpatient orders for glycemic control are 1-5 units insulin aspart TID and q HS, 2 units insulin aspart TID with meals, and 12 units insulin glargine q HS).   Diet Order:   Diet Order            DIET  - DYS 1 Room service appropriate? No; Fluid consistency: Thin  Diet effective now              EDUCATION NEEDS:   No education needs have been identified at this time  Skin:  Skin Assessment: Reviewed RN Assessment  Last BM:  09/02/18  Height:   Ht Readings from Last 1 Encounters:  08/28/18 6' (1.829 m)    Weight:   Wt Readings from Last 1 Encounters:  09/09/18 72 kg    Ideal Body Weight:  80.9 kg  BMI:  Body mass index is 21.53 kg/m.  Estimated Nutritional Needs:   Kcal:  2050-2250  Protein:  90-110 gm  Fluid:  2.2 L    Anas Reister A. Jimmye Norman, RD, LDN, [place] Registered Dietitian II Certified Diabetes Care and Education Specialist Pager: [phone] After hours Pager: [phone]

## 2018-09-09 NOTE — Progress Notes (Addendum)
Inpatient Diabetes Program Recommendations  AACE/ADA: New Consensus Statement on Inpatient Glycemic Control (2015)  Target Ranges:  Prepandial:   less than 140 mg/dL      Peak postprandial:   less than 180 mg/dL (1-2 hours)      Critically ill patients:  140 - 180 mg/dL   Results for [name], [name] (MRN [id number]) as of 09/09/2018 15:22  Ref. Range 09/08/2018 08:40 09/08/2018 09:58 09/08/2018 11:37 09/08/2018 16:37 09/08/2018 16:38 09/08/2018 20:03 09/08/2018 23:27  Glucose-Capillary Latest Ref Range: 70 - 99 mg/dL 39 (LL) 181 (H) 136 (H) 389 (H)  7 units NOVOLOG  403 (H) 400 (H)  5 units NOVOLOG  258 (H)     12 units LANTUS   Results for [name], [name] (MRN [id number]) as of 09/09/2018 15:22  Ref. Range 09/09/2018 01:53 09/09/2018 04:13 09/09/2018 07:43 09/09/2018 11:17  Glucose-Capillary Latest Ref Range: 70 - 99 mg/dL 184 (H) 180 (H) 146 (H)  2 units NOVOLOG  51 (L)     Home DM Meds: 70/30 Insulin- 15 units AM/ 10 units PM  Current Orders: Lantus 12 units QHS      Novolog 1-5 units tid ac + hs (coverage starts at 1 unit at CBG of 151 mg/dl)      Novolog 2 units TID with meals     Severe Hypoglycemia yesterday AM after receiving 16 units Lantus the night prior.  Lantus dose reduced to 12 units QHS last PM--CBG better this AM at 146 mg/dl  Pt received 2 units Novolog SSI at 8am and CBG dropped to 51 mg/d/ at 12pm today--Already getting very sensitive/conservative SSI regimen.     --Will follow patient during hospitalization--  Wyn Quaker RN, MSN, CDE Diabetes Coordinator Inpatient Glycemic Control Team Team Pager: [phone] (8a-5p)

## 2018-09-09 NOTE — Progress Notes (Signed)
  Speech Language Pathology Treatment: Dysphagia  Patient Details Name: [name] MRN: [id number] DOB: 1956-03-25 Today's Date: 09/09/2018 Time: 0340-0400 SLP Time Calculation (min) (ACUTE ONLY): 20 min  Assessment / Plan / Recommendation Clinical Impression  Patient seen to address dysphagia goals with trials of dysphagia 3 solids, mixed consistencies. Patient with mildly congested voice prior to PO intake. He was able to verbalize and demonstrate understanding of swallow precautions to "not use straws" and "eat slowly". Only overt s/s of aspiration or penetration was a delayed throat clear towards end of PO intake. Patient is safe for upgrade back to Dys 3 solids and continue with thin liquids based on his performance during today's session.    HPI HPI: 62 y.o. male admitted on 08/23/18 for severe diabetic ketoacidosis hypothermia, AMS, hypoxia, elevated amonia, lactic acidosis, hyperkalemia, AKI, UGIB, and aspiration PNA. (+) cocaine and marijuana.  Intubated in the ED on 5/10 extubated 08/27/18.    Pt s/p EGD on 5/12 showing severe esophagitis.  Pt with other significant PMH of diabetic retinopathy, HTN, DM 1, peripheral vascular balloon angio, R toe amputation. MBS in 2019 showed intermittent aspiration of thin liquids with variable sensation.      SLP Plan  Continue with current plan of care       Recommendations  Diet recommendations: Dysphagia 3 (mechanical soft);Thin liquid Liquids provided via: Cup;No straw Medication Administration: Whole meds with puree Supervision: Patient able to self feed;Intermittent supervision to cue for compensatory strategies Compensations: Slow rate;Small sips/bites;Clear throat intermittently;Minimize environmental distractions Postural Changes and/or Swallow Maneuvers: Seated upright 90 degrees                Oral Care Recommendations: Oral care BID Follow up Recommendations: None SLP Visit Diagnosis: Dysphagia, oropharyngeal phase  (R13.12) Plan: Continue with current plan of care       GO                [name] 09/09/2018, 4:15 PM    Sonia Baller, MA, [facility] Pager: [phone]

## 2018-09-10 DIAGNOSIS — R4182 Altered mental status, unspecified: Secondary | ICD-10-CM

## 2018-09-10 DIAGNOSIS — K219 Gastro-esophageal reflux disease without esophagitis: Secondary | ICD-10-CM

## 2018-09-10 DIAGNOSIS — J9600 Acute respiratory failure, unspecified whether with hypoxia or hypercapnia: Secondary | ICD-10-CM

## 2018-09-10 LAB — CULTURE, BLOOD (ROUTINE X 2)
Culture: NO GROWTH
Culture: NO GROWTH
Special Requests: ADEQUATE
Special Requests: ADEQUATE

## 2018-09-10 LAB — GLUCOSE, CAPILLARY
Glucose-Capillary: 59 mg/dL — ABNORMAL LOW (ref 70–99)
Glucose-Capillary: 61 mg/dL — ABNORMAL LOW (ref 70–99)
Glucose-Capillary: 101 mg/dL — ABNORMAL HIGH (ref 70–99)
Glucose-Capillary: 196 mg/dL — ABNORMAL HIGH (ref 70–99)

## 2018-09-10 MED ORDER — FOLIC ACID 1 MG PO TABS: 1.0000 mg | ORAL_TABLET | Freq: Every day | ORAL | 1 refills | Status: DC

## 2018-09-10 MED ORDER — INSULIN GLARGINE 100 UNIT/ML ~~LOC~~ SOLN
10.0000 [IU] | Freq: Every day | SUBCUTANEOUS | Status: DC
  Filled 2018-09-10: qty 0.1

## 2018-09-10 MED ORDER — PANTOPRAZOLE SODIUM 40 MG PO TBEC: 40.0000 mg | DELAYED_RELEASE_TABLET | Freq: Two times a day (BID) | ORAL | 1 refills | Status: DC

## 2018-09-10 MED ORDER — RISPERIDONE 0.5 MG PO TABS: 0.2500 mg | ORAL_TABLET | Freq: Two times a day (BID) | ORAL | 1 refills | Status: DC

## 2018-09-10 MED ORDER — ENSURE ENLIVE PO LIQD: 1.0000 | Freq: Three times a day (TID) | ORAL | Status: DC

## 2018-09-10 MED ORDER — FLUCONAZOLE 200 MG PO TABS: 200.0000 mg | ORAL_TABLET | Freq: Every day | ORAL | 0 refills | Status: AC

## 2018-09-10 MED ORDER — AMOXICILLIN-POT CLAVULANATE 875-125 MG PO TABS: 1.0000 | ORAL_TABLET | Freq: Two times a day (BID) | ORAL | 0 refills | Status: AC

## 2018-09-10 MED ORDER — SUCRALFATE 1 GM/10ML PO SUSP: 1.0000 g | Freq: Three times a day (TID) | ORAL | 0 refills | Status: DC

## 2018-09-10 MED ORDER — PREGABALIN 50 MG PO CAPS: 50.0000 mg | ORAL_CAPSULE | Freq: Two times a day (BID) | ORAL | 1 refills | Status: DC

## 2018-09-10 NOTE — Progress Notes (Signed)
Chrissie Noa Meiser to be D/C'd Home per MD order.  Discussed with the patient and all questions fully answered.  VSS, Skin clean, dry and intact without evidence of skin break down, no evidence of skin tears noted. IV catheter discontinued intact. Site without signs and symptoms of complications. Dressing and pressure applied.  An After Visit Summary was printed and given to the patient. Patient received prescription.  D/c education completed with patient/family including follow up instructions, medication list, d/c activities limitations if indicated, with other d/c instructions as indicated by MD - patient able to verbalize understanding, all questions fully answered.   Patient instructed to return to ED, call 911, or call MD for any changes in condition.   Patient escorted via [place], and D/C home via private auto.  Luci Bank 09/10/2018 5:55 PM

## 2018-09-10 NOTE — Progress Notes (Addendum)
Physical Therapy Treatment Patient Details Name: [name] MRN: [id number] DOB: 1955-08-11 Today's Date: 09/10/2018    History of Present Illness 63 y.o. male admitted on 08/23/18 for severe diabetic ketoacidosis hypothermia, AMS, hypoxia, elevated amonia, lactic acidosis, hyperkalemia, AKI, UGIB, and aspiration PNA. (+) cocaine and marijuana.  Intubated in the ED on 5/10 extubated 08/27/18.    Pt s/p EGD on 08/25/18.  Pt with other significant PMH of diabetic retinopathy, HTN, DM 1, peripheral vascular balloon angio, R toe amputation.     PT Comments    Patient seen for mobility progression. Pt is making progress toward PT goals and requires min guard assist for mobility this session. Pt will need RW for support for safe ambulation as he continues to demonstrate generalized weakness and balance deficits. Recommend HHPT for further skilled PT services to maximize independence and safety with mobility.     Follow Up Recommendations  Home health PT;Supervision/Assistance - 24 hour     Equipment Recommendations  Rolling walker with 5" wheels    Recommendations for Other Services       Precautions / Restrictions Precautions Precautions: Fall    Mobility  Bed Mobility               General bed mobility comments: pt OOB in chair upon arrival  Transfers Overall transfer level: Needs assistance Equipment used: Rolling walker (2 wheeled) Transfers: Sit to/from Stand Sit to Stand: Min guard         General transfer comment: min guard for safety  Ambulation/Gait Ambulation/Gait assistance: Min guard Gait Distance (Feet): 250 Feet Assistive device: Rolling walker (2 wheeled) Gait Pattern/deviations: Step-through pattern;Decreased stride length;Decreased dorsiflexion - right;Decreased dorsiflexion - left;Scissoring;Narrow base of support Gait velocity: decreased   General Gait Details: cues for posture and increased BOS; min guard for safety   Stairs Stairs: Yes Stairs  assistance: Min guard Stair Management: Two rails;Step to pattern;Forwards Number of Stairs: 5 General stair comments: cues for safety; pt insisted on descending steps backwards; min guard for safety   Wheelchair Mobility    Modified Rankin (Stroke Patients Only)       Balance Overall balance assessment: Needs assistance Sitting-balance support: Feet supported;No upper extremity supported Sitting balance-Leahy Scale: Good     Standing balance support: Bilateral upper extremity supported;During functional activity Standing balance-Leahy Scale: Poor Standing balance comment: requires bilat UE on RW                             Cognition Arousal/Alertness: Awake/alert Behavior During Therapy: Impulsive;WFL for tasks assessed/performed Overall Cognitive Status: No family/caregiver present to determine baseline cognitive functioning Area of Impairment: Following commands;Safety/judgement;Problem solving;Memory                     Memory: Decreased short-term memory Following Commands: Follows one step commands with increased time Safety/Judgement: Decreased awareness of safety;Decreased awareness of deficits Awareness: Emergent Problem Solving: Difficulty sequencing;Decreased initiation;Requires verbal cues General Comments: Impulsive, decreased safety awareness. tangential       Exercises      General Comments        Pertinent Vitals/Pain Pain Assessment: Faces Pain Score: 0-No pain Faces Pain Scale: Hurts a little bit Pain Location: "a hole in his tooth" Pain Descriptors / Indicators: Aching Pain Intervention(s): Monitored during session    Home Living  Prior Function            PT Goals (current goals can now be found in the care plan section) Acute Rehab PT Goals Patient Stated Goal: to go home Progress towards PT goals: Progressing toward goals    Frequency    Min 3X/week      PT Plan Discharge  plan needs to be updated    Co-evaluation              AM-PAC PT "6 Clicks" Mobility   Outcome Measure  Help needed turning from your back to your side while in a flat bed without using bedrails?: None Help needed moving from lying on your back to sitting on the side of a flat bed without using bedrails?: None Help needed moving to and from a bed to a chair (including a wheelchair)?: A Little Help needed standing up from a chair using your arms (e.g., wheelchair or bedside chair)?: A Little Help needed to walk in hospital room?: A Little Help needed climbing 3-5 steps with a railing? : A Little 6 Click Score: 20    End of Session Equipment Utilized During Treatment: Gait belt Activity Tolerance: Patient tolerated treatment well Patient left: with call bell/phone within reach;with nursing/sitter in room;in bed(sitting EOB) Nurse Communication: Mobility status PT Visit Diagnosis: Muscle weakness (generalized) (M62.81);Difficulty in walking, not elsewhere classified (R26.2)     Time: 0034-9179 PT Time Calculation (min) (ACUTE ONLY): 29 min  Charges:  $Gait Training: 23-37 mins                     Earney Navy, PTA Acute Rehabilitation Services Pager: [phone] Office: [phone]     Darliss Cheney 09/10/2018, 4:17 PM

## 2018-09-10 NOTE — Progress Notes (Addendum)
Inpatient Diabetes Program Recommendations  AACE/ADA: New Consensus Statement on Inpatient Glycemic Control (2015)  Target Ranges:  Prepandial:   less than 140 mg/dL      Peak postprandial:   less than 180 mg/dL (1-2 hours)      Critically ill patients:  140 - 180 mg/dL   Results for SHULEM, MADER (MRN [id number]) as of 09/10/2018 10:51  Ref. Range 09/08/2018 23:27 09/09/2018 01:53 09/09/2018 04:13 09/09/2018 07:43 09/09/2018 11:17 09/09/2018 16:36 09/09/2018 21:55  Glucose-Capillary Latest Ref Range: 70 - 99 mg/dL 258 (H) 184 (H) 180 (H) 146 (H)  2 units NOVOLOG (meal coverage dose)  51 (L) 188 (H)  3 units NOVOLOG  173 (H)  1 unit NOVOLOG +  12 units LANTUS given at 10pm    Results for BRINDEN, KINCHELOE (MRN [id number]) as of 09/10/2018 10:51  Ref. Range 09/10/2018 06:47 09/10/2018 07:25 09/10/2018 08:10  Glucose-Capillary Latest Ref Range: 70 - 99 mg/dL 59 (L) 61 (L) 101 (H)     Home DM Meds: 70/30 Insulin- 15 units AM/ 10 units PM  Current Orders: Lantus 12 units QHS                            Novolog 1-5 units tid ac + hs (coverage starts at 1 unit at CBG of 151 mg/dl)                            Novolog 2 units TID with meals       Pt received 2 units Novolog SSI at 8am yesterday and CBG dropped to 51 mg/d/ at 12pm--Already getting very sensitive/conservative SSI regimen.  Received 12 units Lantus last PM--Hypoglycemic this AM (CBG 59 mg/dl).     MD- Please consider reducing Lantus to 8 units QHS      --Will follow patient during hospitalization--  Wyn Quaker RN, MSN, CDE Diabetes Coordinator Inpatient Glycemic Control Team Team Pager: [phone] (8a-5p)

## 2018-09-10 NOTE — Discharge Summary (Addendum)
Physician Discharge Summary  [name] FFM:[id number] DOB: 02-01-1956 DOA: 08/23/2018  PCP: Patient, No Pcp Per  Admit date: 08/23/2018 Discharge date: 09/10/2018  Time spent: 35 minutes  Recommendations for Outpatient Follow-up:  1. Repeat basic metabolic panel to follow electrolytes and renal function 2. Repeat CBC to follow hemoglobin trend 3. Repeat chest x-ray in 4 weeks to assure complete resolution of infiltrates. 4. Close follow-up the patient CBGs with further adjustment to hypoglycemic regimen as needed. 5. Reassess blood pressure and adjust antihypertensive medication as required.   Discharge Diagnoses:  Principal Problem:   Hallucinations Active Problems:   AKI (acute kidney injury) ([place])   DKA (diabetic ketoacidoses) ([name]   Acute respiratory failure ([name]   Altered mental status   Gastroesophageal reflux disease   Discharge Condition: Stable and improved.  Patient mentation back to baseline.  Oriented x3.  Discharge home with family care and home health services.  Diet recommendation: Dysphagia 1 with thin liquids; modified carbohydrates and heart healthy diet.  Filed Weights   09/07/18 0551 09/09/18 0330 09/10/18 0525  Weight: 68.9 kg 72 kg 61.7 kg    History of present illness:  As per H&P written by Dr. Jonelle Sidle on 08/12/2018 63 y.o. male with medical history significant of type I diabetes with poor control, hypertension, chronic hepatitis C, vitiligo, polysubstance abuse, depression, medication noncompliance, hyperlipidemia who presented to the ER brought in by EMS secondary to blood sugar of 31.  Patient refused EMS transport initially.  They were called at 4:00 and then at 7:00 when the sugar was too low.  He arrived the ER sweaty lethargic and not following commands.  Patient initially received dextrose bolus and D10 glucose.  Blood sugar jumped to 222 on arrival.  In the ER however his blood sugar has continued to drop and not being maintained above 40-50.   Patient reported apparently taking insulin but not sure of the dose.  With his noncompliance not sure of what he took and when he took it.  He is awake now however blood sugar is still borderline below 60.  It responds to IV dextrose.  Patient is being admitted to the hospital for prolonged hypoglycemia.  He has history of polysubstance abuse including marijuana use.  Denied taking any IV drugs tonight.  He has no fever no chills.  No nausea vomiting or diarrhea..  ED Course: Initial temperature 98.3 blood pressure 102/52 with pulse 81 respiratory rate 24 oxygen sat 94% room air.  White count is 14.1 hemoglobin 11.7 platelet 466.  BUN 31 creatinine 1.44.  Glucose maintained in the 40s.  Patient initiated on D10 and being admitted to the hospital for treatment.   Hospital Course:  Acute metabolic encephalopathy Fluctuating mental status -Most likely from DKA/septic shock (from PNA)/respiratory failure/AKI -Mental status is much improved and at this time related to being discharged with family care and home health services.   -He will complete treatment with antibiotic for underlying aspiration pneumonia and has been instructed to be compliant with medication for his diabetes management.  Intermittent hallucinations -Patient experienced intermittent episodes of hallucinations. -Psychiatry evaluation appreciated. Will continue the using Risperdal 0.25 mg twice a day. -No hallucinations or suicidal ideation at discharge -Patient oriented x3 and demonstrating motivation to get better and improve his overall health.  Acute hypoxic respiratory failure/Aspiration pneumonia -Intubated on admission and extubated on 08/27/2018. -Currently on room air -Had completed antibiotic course with Unasyn on 08/29/2018. -Sputum culture had shown rare Staphylococcus aureus and few Candida albicans. -  COVID-19 testing was negative -Blood cultures have been negative so far -Diet as per SLP  recommendations. -Patient instructed to complete 5 more days of Augmentin as part of treatment for aspiration pneumonia. -Repeat chest x-ray at in 4 weeks to assure complete resolution of infiltrates.  Upper GI bleeding Acute blood loss anemia -Underwent EGD on 08/25/2018 which showed severe esophagitis along with nonbleeding gastric ulcers. -Continue PPI twice a day and Carafate as recommended by GI service. -Hemoglobin 8.0. -Repeat CBC at follow-up visit to reassess hemoglobin trend and transfuse for hemoglobin less than 7. -GI service has also recommended the use of Diflucan for 14 days in order to treat underlying Candida esophagitis.  Hypertension -Blood pressure stable and well-controlled -Continue amlodipine -Heart healthy diet has been recommended.  Diabetes mellitus type 1, poorly controlled. -DKA: Present on admission.  Now Resolved -Intermittent hyperglycemia and hypoglycemia, base on decrease oral intake. -Blood sugars still fluctuating some, but overall much improved. -Patient has been advised to avoid skipping meals, to check his blood sugar frequently and to resume the use of insulin as previously instructed. -Will need outpatient follow-up to further adjust hypoglycemic regimen as required.  Acute kidney injury -Resolved.   -Repeat basic metabolic panel at follow-up visit to reassess electrolytes and renal function.  Peripheral neuropathy  -will add low dose lyrica -follow neuropathy symptoms. -Adjust medication dosage as an outpatient as needed.  Cocaine/marijuana use -cessation counseling provided. -patient expressed motivation to quit and is looking to keep himself clean after discharge.   Generalized deconditioning -Patient very deconditioned and will benefit from SNF on discharge to continue ongoing physical therapy.  Social worker was following, and given his prior history of substance abuse was unable to replace in a nursing facility. -Home health  services to provide physical therapy, Occupational Therapy, speech therapy and home health nurse was arranged at time of discharge. -Patient will be discharged home with family care.  Hypoalbuminemia/moderate protein calorie malnutrition  -Continue feeding supplements and follow nutritional service recommendations. -Patient advised to maintain adequate hydration.  Dysphagia -From acute illness/debility and esophagitis -Diet as per SLP recommendations; will discharge with home health speech therapy follow-up. -Continue empiric Diflucan as per GI for total of 14-day course which was started on 08/31/2018 (anticipated Diflucan last day 09/15/2018).  Procedures:  Endoscopy 08/25/2018: Demonstrating severe esophagitis with nonbleeding gastric ulcers  Intubation and ventilatory support 08/23/2018>> 08/27/2018  5 IJ central line 08/23/2018>> 08/28/2018  Consultations:  CCM  Gastroenterology  Discharge Exam: Vitals:   09/10/18 0812 09/10/18 1255  BP: (!) 129/55 123/61  Pulse: 73 80  Resp: 18 18  Temp: 98.2 F (36.8 C) 98.6 F (37 C)  SpO2: 98% 95%    General: Alert, awake and oriented x3; no chest pain, no nausea, no vomiting.  Reports appetite continue improving.  Complaining of neuropathy in his legs and is requiring the use of a rolling walker for balance/ambulation and stability. Cardiovascular: S1 and S2, positive systolic ejection murmur, no rubs, no gallops, no JVD. Respiratory: No using accessory muscles, good oxygen saturation on room air; positive scattered rhonchi.  No wheezing, no crackles. Abdomen: Soft, nontender, nondistended, positive bowel sounds Extremities: No edema, no cyanosis, no clubbing.  Discharge Instructions   Discharge Instructions    Diet - low sodium heart healthy   Complete by:  As directed    Diet Carb Modified   Complete by:  As directed    Discharge instructions   Complete by:  As directed    Dysphagia 1 diet  with thin liquids Maintain  adequate hydration Monitor blood sugar and use insulin as instructed Arrange follow-up with PCP in 10 days.     Allergies as of 09/10/2018      Reactions   Codeine Itching   Tolerates hydrocodone/apap      Medication List    STOP taking these medications   gabapentin 400 MG capsule Commonly known as:  NEURONTIN     TAKE these medications   Accu-Chek Aviva Plus w/Device Kit 1 each by Does not apply route 2 (two) times a day. Use to monitor glucose levels BID; E10.29   Accu-Chek Aviva Soln 1 each by In Vitro route as needed. Use to calibrate meter prn   Accu-Chek Softclix Lancets lancets Use to monitor glucose levels BID; E10.29   amLODipine 10 MG tablet Commonly known as:  NORVASC Take 1 tablet (10 mg total) by mouth daily for 30 days.   amoxicillin-clavulanate 875-125 MG tablet Commonly known as:  AUGMENTIN Take 1 tablet by mouth every 12 (twelve) hours for 5 days.   blood glucose meter kit and supplies Kit Dispense based on patient and insurance preference. Use up to four times daily as directed. (FOR ICD-9 250.00, 250.01).   feeding supplement (ENSURE ENLIVE) Liqd Take 237 mLs by mouth 3 (three) times daily between meals. What changed:  when to take this   fluconazole 200 MG tablet Commonly known as:  DIFLUCAN Take 1 tablet (200 mg total) by mouth daily for 4 days. Start taking on:  Sep 11, 6438   folic acid 1 MG tablet Commonly known as:  FOLVITE Take 1 tablet (1 mg total) by mouth daily. Start taking on:  Sep 11, 2018   glucose blood test strip Commonly known as:  Accu-Chek Aviva Plus Use to monitor glucose levels BID; E10.29   insulin aspart protamine - aspart (70-30) 100 UNIT/ML FlexPen Commonly known as:  NOVOLOG 70/30 MIX Inject 15 units every morning and 10 units every evening.   Insulin Pen Needle 31G X 5 MM Misc Use with novolog flex pen   Isopropyl Alcohol Wipes 70 % Misc Apply 1 each topically daily as needed.   nicotine 21 mg/24hr  patch Commonly known as:  NICODERM CQ - dosed in mg/24 hours Place 1 patch (21 mg total) onto the skin daily.   pantoprazole 40 MG tablet Commonly known as:  PROTONIX Take 1 tablet (40 mg total) by mouth 2 (two) times daily. What changed:  when to take this   pregabalin 50 MG capsule Commonly known as:  LYRICA Take 1 capsule (50 mg total) by mouth 2 (two) times daily.   risperiDONE 0.5 MG tablet Commonly known as:  RISPERDAL Take 0.5 tablets (0.25 mg total) by mouth 2 (two) times daily.   sucralfate 1 GM/10ML suspension Commonly known as:  CARAFATE Take 10 mLs (1 g total) by mouth 4 (four) times daily -  with meals and at bedtime for 12 days.      Allergies  Allergen Reactions  . Codeine Itching    Tolerates hydrocodone/apap    The results of significant diagnostics from this hospitalization (including imaging, microbiology, ancillary and laboratory) are listed below for reference.    Significant Diagnostic Studies: Dg Chest 1 View  Result Date: 08/23/2018 CLINICAL DATA:  Central line placement. EXAM: CHEST  1 VIEW COMPARISON:  Radiograph of same day. FINDINGS: The heart size and mediastinal contours are within normal limits. No pneumothorax or pleural effusion is noted. Stable position of right internal jugular  catheter with distal tip in expected position of cavoatrial junction. Endotracheal tube is unchanged in position. Both lungs are clear. The visualized skeletal structures are unremarkable. IMPRESSION: Stable support apparatus. No acute cardiopulmonary abnormality seen. Electronically Signed   By: Marijo Conception M.D.   On: 08/23/2018 15:51   Dg Chest 2 View  Result Date: 09/05/2018 CLINICAL DATA:  63 year old male with history of severe diabetic ketoacidosis and hypothermia presenting with altered mental status and hypoxia. Aspiration pneumonia. EXAM: CHEST - 2 VIEW COMPARISON:  Chest x-ray 09/01/2018. FINDINGS: Worsening bilateral lower lobe airspace consolidation,  compatible with progressive multilobar pneumonia. Small left pleural effusion. No right pleural effusion. No evidence of pulmonary edema. Heart size is normal. Upper mediastinal contours are within normal limits. IMPRESSION: 1. Worsening bilateral lower lobe pneumonia with small left parapneumonic pleural effusion. Electronically Signed   By: Vinnie Langton M.D.   On: 09/05/2018 08:27   Dg Chest 2 View  Result Date: 08/12/2018 CLINICAL DATA:  Left rib pain after fall. EXAM: CHEST - 2 VIEW COMPARISON:  Radiograph of June 15, 2018. FINDINGS: The heart size and mediastinal contours are within normal limits. Both lungs are clear. No pneumothorax or pleural effusion is noted. Old left rib fractures are noted. IMPRESSION: No active cardiopulmonary disease. Electronically Signed   By: Marijo Conception M.D.   On: 08/12/2018 09:27   Ct Head Wo Contrast  Result Date: 08/23/2018 CLINICAL DATA:  Unwitnessed fall. EXAM: CT HEAD WITHOUT CONTRAST CT CERVICAL SPINE WITHOUT CONTRAST TECHNIQUE: Multidetector CT imaging of the head and cervical spine was performed following the standard protocol without intravenous contrast. Multiplanar CT image reconstructions of the cervical spine were also generated. COMPARISON:  CT scan of February 03, 2018. FINDINGS: CT HEAD FINDINGS Brain: Mild diffuse cortical atrophy is noted. Mild chronic ischemic white matter disease is noted. Stable small calcified right frontal meningioma. No mass effect or midline shift is noted. There is no evidence of hemorrhage or acute infarction. Vascular: No hyperdense vessel or unexpected calcification. Skull: Normal. Negative for fracture or focal lesion. Sinuses/Orbits: No acute finding. Other: None. CT CERVICAL SPINE FINDINGS Alignment: Normal. Skull base and vertebrae: No acute fracture. No primary bone lesion or focal pathologic process. Soft tissues and spinal canal: No prevertebral fluid or swelling. No visible canal hematoma. Disc levels: Severe  degenerative disc disease is noted at C6-7 with anterior and posterior osteophyte formation. Upper chest: Negative. Other: None. IMPRESSION: Mild diffuse cortical atrophy. Mild chronic ischemic white matter disease. No acute intracranial abnormality seen. Severe degenerative disc disease is noted at C6-7. No acute abnormality seen in the cervical spine. Electronically Signed   By: Marijo Conception M.D.   On: 08/23/2018 13:35   Ct Cervical Spine Wo Contrast  Result Date: 08/23/2018 CLINICAL DATA:  Unwitnessed fall. EXAM: CT HEAD WITHOUT CONTRAST CT CERVICAL SPINE WITHOUT CONTRAST TECHNIQUE: Multidetector CT imaging of the head and cervical spine was performed following the standard protocol without intravenous contrast. Multiplanar CT image reconstructions of the cervical spine were also generated. COMPARISON:  CT scan of February 03, 2018. FINDINGS: CT HEAD FINDINGS Brain: Mild diffuse cortical atrophy is noted. Mild chronic ischemic white matter disease is noted. Stable small calcified right frontal meningioma. No mass effect or midline shift is noted. There is no evidence of hemorrhage or acute infarction. Vascular: No hyperdense vessel or unexpected calcification. Skull: Normal. Negative for fracture or focal lesion. Sinuses/Orbits: No acute finding. Other: None. CT CERVICAL SPINE FINDINGS Alignment: Normal. Skull base and  vertebrae: No acute fracture. No primary bone lesion or focal pathologic process. Soft tissues and spinal canal: No prevertebral fluid or swelling. No visible canal hematoma. Disc levels: Severe degenerative disc disease is noted at C6-7 with anterior and posterior osteophyte formation. Upper chest: Negative. Other: None. IMPRESSION: Mild diffuse cortical atrophy. Mild chronic ischemic white matter disease. No acute intracranial abnormality seen. Severe degenerative disc disease is noted at C6-7. No acute abnormality seen in the cervical spine. Electronically Signed   By: Marijo Conception M.D.    On: 08/23/2018 13:35   Dg Chest Port 1 View  Result Date: 09/01/2018 CLINICAL DATA:  Shortness of breath EXAM: PORTABLE CHEST 1 VIEW COMPARISON:  Aug 27, 2018 FINDINGS: There is airspace consolidation in the left lower lobe with small left pleural effusion, slightly increased. Right lung remains clear. Heart size and pulmonary vascular normal. No adenopathy. There is right carotid artery calcification. No bone lesions. IMPRESSION: Left lower lobe airspace consolidation with small left pleural effusion, slightly increased from most recent study. Right lung clear. Stable cardiac silhouette. There is right carotid artery calcification. Electronically Signed   By: Lowella Grip III M.D.   On: 09/01/2018 15:13   Dg Chest Port 1 View  Result Date: 08/27/2018 CLINICAL DATA:  64 year old male with respiratory failure, negative for COVID-19 on 08/23/2018. EXAM: PORTABLE CHEST 1 VIEW COMPARISON:  08/26/2018 and earlier. FINDINGS: Portable AP semi upright views at 0445 hours. Endotracheal tube tip at the level the clavicles. Stable right IJ central line. Enteric tube no longer identified. Increasing and confluent retrocardiac opacity with some air bronchograms now, new since 08/24/2018. No pneumothorax, pulmonary edema, pleural effusion or other confluent opacity. Paucity of bowel gas in the upper abdomen. No acute osseous abnormality identified. IMPRESSION: 1. Increasing retrocardiac opacity with air bronchograms. Favor pneumonia. No pleural effusion is evident. 2. Enteric tube no longer identified. Stable ET tube and right IJ central line. Electronically Signed   By: Genevie Ann M.D.   On: 08/27/2018 08:18   Dg Chest Port 1 View  Result Date: 08/26/2018 CLINICAL DATA:  Acute respiratory failure. EXAM: PORTABLE CHEST 1 VIEW COMPARISON:  Radiographs of Aug 24, 2018. FINDINGS: Endotracheal tube is in good position. Right internal jugular catheter is unchanged. Nasogastric tube is no longer present. No pneumothorax  or pleural effusion is noted. Right lung is clear. Increased left basilar opacity is noted concerning for pneumonia or atelectasis. Bony thorax is unremarkable. IMPRESSION: Increased left basilar opacity is noted concerning for worsening pneumonia or atelectasis. Electronically Signed   By: Marijo Conception M.D.   On: 08/26/2018 07:47   Dg Chest Port 1 View  Result Date: 08/24/2018 CLINICAL DATA:  63 year old male with respiratory failure. COVID-19 status pending. EXAM: PORTABLE CHEST 1 VIEW COMPARISON:  Portable chest 08/23/2018 and earlier. FINDINGS: Portable AP semi upright view at 0341 hours. Endotracheal tube tip in good position between the clavicles and carina. An enteric tube has been placed and terminates in the left upper quadrant at the level of the gastric body. Stable right IJ central line. Mildly larger lung volumes. Normal cardiac size and mediastinal contours. Allowing for portable technique the lungs are clear. Paucity of bowel gas. No acute osseous abnormality identified. IMPRESSION: 1. Stable ET tube and right IJ central line. Enteric tube placed, side hole at the level of the gastric body. 2. No acute cardiopulmonary abnormality. Electronically Signed   By: Genevie Ann M.D.   On: 08/24/2018 05:45   Dg Chest Port 1  View  Result Date: 08/23/2018 CLINICAL DATA:  Status post intubation EXAM: PORTABLE CHEST 1 VIEW COMPARISON:  08/23/2018 FINDINGS: Cardiac shadow is stable. Right jugular central line and endotracheal tube are noted in satisfactory position. No pneumothorax is seen. No focal infiltrate is noted. IMPRESSION: Tubes and lines as described above. No significant interval changes noted. Electronically Signed   By: Inez Catalina M.D.   On: 08/23/2018 15:08   Dg Chest Port 1 View  Result Date: 08/23/2018 CLINICAL DATA:  Altered mental status and possible aspiration. EXAM: PORTABLE CHEST 1 VIEW COMPARISON:  08/12/2018 and prior radiographs FINDINGS: The cardiomediastinal silhouette is  unremarkable. There is no evidence of focal airspace disease, pulmonary edema, suspicious pulmonary nodule/mass, pleural effusion, or pneumothorax. No acute bony abnormalities are identified. Remote LEFT rib fractures again noted. IMPRESSION: No active disease. Electronically Signed   By: Margarette Canada M.D.   On: 08/23/2018 11:20   Dg Abd Portable 1v  Result Date: 08/23/2018 CLINICAL DATA:  OG tube placement EXAM: PORTABLE ABDOMEN - 1 VIEW COMPARISON:  None. FINDINGS: Esophagogastric tube with tip and side port below the diaphragm. Nonobstructive pattern of partially included bowel. IMPRESSION: Esophagogastric tube with tip and side port below the diaphragm. Nonobstructive pattern of partially included bowel. Electronically Signed   By: Eddie Candle M.D.   On: 08/23/2018 19:59   Dg Foot Complete Right  Result Date: 08/12/2018 CLINICAL DATA:  Toe pain after fall. EXAM: RIGHT FOOT COMPLETE - 3+ VIEW COMPARISON:  Radiographs of April 19, 2017 and July 27, 2016. FINDINGS: Status post amputation of first toe. Vascular calcifications are noted. There is noted posterior dislocation of the second middle phalanx relative to the second proximal phalanx. No definite fracture is noted. Linear metallic density is seen in the plantar soft tissues posteriorly underneath the calcaneus consistent with foreign body which was present on prior exam. IMPRESSION: Posterior dislocation of second middle phalanx relative to the second proximal phalanx. No definite fracture is noted. Linear metallic foreign body is seen in plantar soft tissues underneath the calcaneus which was present on prior exam. Electronically Signed   By: Marijo Conception M.D.   On: 08/12/2018 09:30   Dg Swallowing Func-speech Pathology  Result Date: 08/28/2018 Objective Swallowing Evaluation: Type of Study: MBS-Modified Barium Swallow Study  Patient Details Name: [name] MRN: [id number] Date of Birth: [birth year] Today's Date: 08/28/2018 Time: SLP Start  Time (ACUTE ONLY): 0955 -SLP Stop Time (ACUTE ONLY): 1011 SLP Time Calculation (min) (ACUTE ONLY): 16 min Past Medical History: Past Medical History: Diagnosis Date . DEPRESSION  . DIABETES MELLITUS, TYPE I  . DRUG ABUSE   pt should have NO controlled substances rx'ed . Glaucoma  . HEPATITIS C   chronic . Hypertension 02/18/2011 . Proliferative diabetic retinopathy(362.02)  . Vitiligo  Past Surgical History: Past Surgical History: Procedure Laterality Date . ABDOMINAL AORTOGRAM W/LOWER EXTREMITY N/A 07/25/2016  Procedure: Abdominal Aortogram w/Bilateral Lower Extremity Runoff;  Surgeon: Conrad Primrose, MD;  Location: [facility];  Service: Cardiovascular;  Laterality: N/A; . ABDOMINAL AORTOGRAM W/LOWER EXTREMITY N/A 12/24/2016  Procedure: ABDOMINAL AORTOGRAM W/LOWER EXTREMITY;  Surgeon: Serafina Mitchell, MD;  Location: [facility] [facility];  Service: Cardiovascular;  Laterality: N/A; . AMPUTATION TOE Right 07/26/2016  Procedure: AMPUTATION GREAT TOE;  Surgeon: Serafina Mitchell, MD;  Location: [facility];  Service: Vascular;  Laterality: Right; . BIOPSY  08/25/2018  Procedure: BIOPSY;  Surgeon: Rush Landmark Telford Nab., MD;  Location: [facility];  Service: Gastroenterology;; . ESOPHAGOGASTRODUODENOSCOPY  N/A 08/25/2018  Procedure: ESOPHAGOGASTRODUODENOSCOPY (EGD);  Surgeon: Irving Copas., MD;  Location: [facility];  Service: Gastroenterology;  Laterality: N/A; . EYE SURGERY    retinal surgery x 2, right eye . INSERTION OF ILIAC STENT Right 07/26/2016  Procedure: INSERTION OF RIGHT POPLITEAL STENT WITH BALLOON ANGIOPLASTY;  Surgeon: Serafina Mitchell, MD;  Location: [place];  Service: Vascular;  Laterality: Right; . LOWER EXTREMITY ANGIOGRAM Right 07/26/2016  Procedure: LOWER EXTREMITY ANGIOGRAM;  Surgeon: Serafina Mitchell, MD;  Location: [place];  Service: Vascular;  Laterality: Right; . PERIPHERAL VASCULAR BALLOON ANGIOPLASTY Right 07/26/2016  Procedure: PERIPHERAL VASCULAR BALLOON ANGIOPLASTY  RIGHT ANTERIOR TIBEAL ARTERY  AND RIGHT SUPERFICIAL FEMORAL ARTERY;  Surgeon: Serafina Mitchell, MD;  Location: [place];  Service: Vascular;  Laterality: Right; . PERIPHERAL VASCULAR INTERVENTION Right 12/24/2016  Procedure: PERIPHERAL VASCULAR INTERVENTION;  Surgeon: Serafina Mitchell, MD;  Location: [facility];  Service: Cardiovascular;  Laterality: Right;  lower extr HPI: 63 y.o. male admitted on 08/23/18 for severe diabetic ketoacidosis hypothermia, AMS, hypoxia, elevated amonia, lactic acidosis, hyperkalemia, AKI, UGIB, and aspiration PNA. (+) cocaine and marijuana.  Intubated in the ED on 5/10 extubated 08/27/18.    Pt s/p EGD on 5/12 showing severe esophagitis.  Pt with other significant PMH of diabetic retinopathy, HTN, DM 1, peripheral vascular balloon angio, R toe amputation. MBS in 2019 showed intermittent aspiration of thin liquids with variable sensation.  Subjective: pt is very eager for POs Assessment / Plan / Recommendation CHL IP CLINICAL IMPRESSIONS 08/28/2018 Clinical Impression Pt has a mild-moderate oropharyngeal dysphagia that appears to be near his baseline in comparison to prior MBS studies in 2019. His oral phase is impacted by his poor dentition, with prolonged mastication. He also has decreased bolus cohesion and oral residue that increases with solids. Fluctuating timing of swallow initiation leads to aspiration before the swallow with thin liquids via straw with variable sensation. Chin tuck actually increased the volume aspirated. He had improved airway protection with cup sips and solids. Vallecular residue decreases as boluses become more solid, suspicious for esophageal component particularly in pt with known GER and esophageal issues. Recommend starting Dys 3 diet and thin liquids via cup only. Will f/u briefly for tolerance. SLP Visit Diagnosis Dysphagia, oropharyngeal phase (R13.12) Attention and concentration deficit following -- Frontal lobe and executive function deficit following -- Impact on safety and  function Mild aspiration risk;Moderate aspiration risk   CHL IP TREATMENT RECOMMENDATION 08/28/2018 Treatment Recommendations Therapy as outlined in treatment plan below   Prognosis 08/28/2018 Prognosis for Safe Diet Advancement (No Data) Barriers to Reach Goals -- Barriers/Prognosis Comment -- CHL IP DIET RECOMMENDATION 08/28/2018 SLP Diet Recommendations Dysphagia 3 (Mech soft) solids;Thin liquid Liquid Administration via Cup;No straw Medication Administration Whole meds with puree Compensations Slow rate;Small sips/bites;Clear throat intermittently Postural Changes Seated upright at 90 degrees;Remain semi-upright after after feeds/meals (Comment)   CHL IP OTHER RECOMMENDATIONS 08/28/2018 Recommended Consults -- Oral Care Recommendations Oral care BID Other Recommendations --   CHL IP FOLLOW UP RECOMMENDATIONS 08/28/2018 Follow up Recommendations None   CHL IP FREQUENCY AND DURATION 08/28/2018 Speech Therapy Frequency (ACUTE ONLY) min 2x/week Treatment Duration 1 week      CHL IP ORAL PHASE 08/28/2018 Oral Phase Impaired Oral - Pudding Teaspoon -- Oral - Pudding Cup -- Oral - Honey Teaspoon -- Oral - Honey Cup -- Oral - Nectar Teaspoon -- Oral - Nectar Cup -- Oral - Nectar Straw -- Oral - Thin Teaspoon -- Oral - Thin  Cup Decreased bolus cohesion Oral - Thin Straw Decreased bolus cohesion Oral - Puree Decreased bolus cohesion;Reduced posterior propulsion;Lingual/palatal residue Oral - Mech Soft Decreased bolus cohesion;Reduced posterior propulsion;Lingual/palatal residue;Impaired mastication Oral - Regular -- Oral - Multi-Consistency -- Oral - Pill -- Oral Phase - Comment --  CHL IP PHARYNGEAL PHASE 08/28/2018 Pharyngeal Phase Impaired Pharyngeal- Pudding Teaspoon -- Pharyngeal -- Pharyngeal- Pudding Cup -- Pharyngeal -- Pharyngeal- Honey Teaspoon -- Pharyngeal -- Pharyngeal- Honey Cup -- Pharyngeal -- Pharyngeal- Nectar Teaspoon -- Pharyngeal -- Pharyngeal- Nectar Cup -- Pharyngeal -- Pharyngeal- Nectar Straw --  Pharyngeal -- Pharyngeal- Thin Teaspoon -- Pharyngeal -- Pharyngeal- Thin Cup Delayed swallow initiation-pyriform sinuses;Penetration/Aspiration before swallow;Pharyngeal residue - valleculae Pharyngeal Material enters airway, remains ABOVE vocal cords then ejected out Pharyngeal- Thin Straw Delayed swallow initiation-pyriform sinuses;Penetration/Aspiration before swallow;Pharyngeal residue - valleculae Pharyngeal Material enters airway, passes BELOW cords without attempt by patient to eject out (silent aspiration);Material enters airway, passes BELOW cords and not ejected out despite cough attempt by patient Pharyngeal- Puree Delayed swallow initiation-vallecula;Pharyngeal residue - valleculae Pharyngeal -- Pharyngeal- Mechanical Soft Delayed swallow initiation-vallecula;Pharyngeal residue - valleculae Pharyngeal -- Pharyngeal- Regular -- Pharyngeal -- Pharyngeal- Multi-consistency -- Pharyngeal -- Pharyngeal- Pill -- Pharyngeal -- Pharyngeal Comment --  CHL IP CERVICAL ESOPHAGEAL PHASE 08/28/2018 Cervical Esophageal Phase Impaired Pudding Teaspoon -- Pudding Cup -- Honey Teaspoon -- Honey Cup -- Nectar Teaspoon -- Nectar Cup -- Nectar Straw -- Thin Teaspoon -- Thin Cup Esophageal backflow into cervical esophagus Thin Straw Esophageal backflow into cervical esophagus Puree -- Mechanical Soft -- Regular -- Multi-consistency -- Pill -- Cervical Esophageal Comment -- Venita Sheffield Nix 08/28/2018, 11:16 AM    Pollyann Glen, M.A. [facility] Pager [phone] Office [phone]            Microbiology: Recent Results (from the past 240 hour(s))  Culture, blood (routine x 2)     Status: None   Collection Time: 09/01/18  1:40 PM  Result Value Ref Range Status   Specimen Description BLOOD RIGHT HAND  Final   Special Requests   Final    BOTTLES DRAWN AEROBIC ONLY Blood Culture adequate volume   Culture   Final    NO GROWTH 5 DAYS Performed at [facility], [address]  [place], [place] [address]    Report Status 09/06/2018 FINAL  Final  Culture, blood (routine x 2)     Status: None   Collection Time: 09/01/18  1:45 PM  Result Value Ref Range Status   Specimen Description BLOOD RIGHT HAND  Final   Special Requests   Final    BOTTLES DRAWN AEROBIC ONLY Blood Culture adequate volume   Culture   Final    NO GROWTH 5 DAYS Performed at [facility], [place] [address], [place], [place] [address]    Report Status 09/06/2018 FINAL  Final  Culture, blood (routine x 2)     Status: None (Preliminary result)   Collection Time: 09/05/18  8:58 AM  Result Value Ref Range Status   Specimen Description BLOOD BLOOD LEFT HAND  Final   Special Requests AEROBIC BOTTLE ONLY Blood Culture adequate volume  Final   Culture   Final    NO GROWTH 4 DAYS Performed at [facility], [place] [address] [place], [place] [address]    Report Status PENDING  Incomplete  Culture, blood (routine x 2)     Status: None (Preliminary result)   Collection Time: 09/05/18  9:03 AM  Result Value  Ref Range Status   Specimen Description BLOOD RIGHT ANTECUBITAL  Final   Special Requests AEROBIC BOTTLE ONLY Blood Culture adequate volume  Final   Culture   Final    NO GROWTH 4 DAYS Performed at [facility], [address], [place], [place] [address]    Report Status PENDING  Incomplete     Labs: Basic Metabolic Panel: Recent Labs  Lab 09/04/18 0309 09/05/18 0333 09/07/18 0222 09/08/18 0230 09/09/18 0325  NA 136 136 138 138 136  K 4.2 3.8 4.0 3.8 4.3  CL 102 102 102 103 103  CO2 26 27 32 27 22  GLUCOSE 271* 196* 178* 72 193*  BUN _0 CREATININE 1.37* 1.29* 1.12 1.16 1.50*  CALCIUM 7.9* 8.0* 8.2* 8.5* 8.3*  MG 1.7 1.8 1.9 1.9 1.9   Liver Function Tests: Recent Labs  Lab 09/04/18 0309 09/07/18 0222  AST 19 30  ALT 22 23  ALKPHOS 210* 193*  BILITOT 0.4 0.3  PROT 5.0* 5.3*  ALBUMIN 1.4* 1.5*    Recent Labs  Lab 09/07/18 0222  AMMONIA 22    CBC: Recent Labs  Lab 09/04/18 0309 09/05/18 0333 09/07/18 0222 09/08/18 0230 09/09/18 0325  WBC 9.8 9.5 7.2 6.5 6.2  NEUTROABS 6.7 6.6 4.6 4.0 3.1  HGB 7.1* 7.1* 7.4* 7.6* 8.0*  HCT 21.6* 21.9* 23.5* 24.0* 24.2*  MCV 87.8 88.3 88.0 88.6 86.4  PLT 515* 538* 608* 612* 531*   CBG: Recent Labs  Lab 09/09/18 2155 09/10/18 0647 09/10/18 0725 09/10/18 0810 09/10/18 1131  GLUCAP 173* 59* 61* 101* 196*    Signed:  Barton Dubois MD.  [name] 09/10/2018, 1:08 PM

## 2018-09-10 NOTE — Care Management Important Message (Signed)
Important Message  Patient Details  Name: [name] MRN: [id number] Date of Birth: February 04, 1956   Medicare Important Message Given:  Yes    Katurah Karapetian 09/10/2018, 4:09 PM

## 2018-09-10 NOTE — TOC Transition Note (Signed)
Transition of Care [facility]) - CM/SW Discharge Note   Patient Details  Name: [name] MRN: [id number] Date of Birth: 1955/08/05  Transition of Care [facility]) CM/SW Contact:  Benard Halsted, LCSW Phone Number: 09/10/2018, 5:01 PM   Clinical Narrative:    [facility] has accepted patient for services.    Final next level of care: Home w Home Health Services Barriers to Discharge: No Barriers Identified   Patient Goals and CMS Choice Patient states their goals for this hospitalization and ongoing recovery are:: "I want to go back home with my sons. But I know I need to go to rehab." CMS Medicare.gov Compare Post Acute Care list provided to:: Patient Choice offered to / list presented to : Patient  Discharge Placement                  Name of family member notified: Son Patient and family notified of of transfer: 09/10/18  Discharge Plan and Services                DME Arranged: Gilford Rile rolling DME Agency: [employer] Date DME Agency Contacted: 09/10/18 Time DME Agency Contacted: 4 Representative spoke with at DME Agency: [place]: RN, Speech Therapy, PT, OT, Social Work [employer] Agency: [place] ([place]) Date [place]: 09/10/18 Time [place]: [place] ([place]) Interventions     Readmission Risk Interventions Readmission Risk Prevention Plan 08/12/2018  Transportation Screening Complete  Medication Review [employer]) Complete  PCP or Specialist appointment within 3-5 days of discharge Complete  HRI or [place] Complete  SW Recovery Care/Counseling Consult Complete  [place] Not Applicable  Some recent data might be hidden

## 2018-09-10 NOTE — Progress Notes (Signed)
  Speech Language Pathology Treatment: Dysphagia  Patient Details Name: [name] MRN: [id number] DOB: March 22, 1956 Today's Date: 09/10/2018 Time: 0172-0910 SLP Time Calculation (min) (ACUTE ONLY): 25 min  Assessment / Plan / Recommendation Clinical Impression  Patient seen to address dysphagia goals with upgraded diet consistency (Dys 3 solids). Patient ate a limited amount as he is easily distracted and more concerned with asking when he is able to leave. Patient did exhibit some throat clearing and expectoration of thick secretions mixed with milk that he had been drinking. His voice was gurgled but then clear after clearing throat and expectoration. Per NT, he was doing this during breakfast meal as well. Patient did not exhibit any overt s/s of aspiration or difficulty with solids and SLP suspects throat clearing and expectoration is partially due to patient consuming large quantities of milk. In addition, he has h/o esophageal dysphagia as well as oropharyngeal dysphagia which are each documented in Epic medical chart. Plan to s/o at this time as patient is likely at his baseline for swallow function. He does verbalize strategies of eating slowly and not using straws and demonstrates effective use without cues.      HPI HPI: 63 y.o. male admitted on 08/23/18 for severe diabetic ketoacidosis hypothermia, AMS, hypoxia, elevated amonia, lactic acidosis, hyperkalemia, AKI, UGIB, and aspiration PNA. (+) cocaine and marijuana.  Intubated in the ED on 5/10 extubated 08/27/18.    Pt s/p EGD on 5/12 showing severe esophagitis.  Pt with other significant PMH of diabetic retinopathy, HTN, DM 1, peripheral vascular balloon angio, R toe amputation. MBS in 2019 showed intermittent aspiration of thin liquids with variable sensation.      SLP Plan  Discharge SLP treatment due to (comment);Other (Comment)(goals met)       Recommendations  Diet recommendations: Dysphagia 3 (mechanical soft);Thin  liquid Liquids provided via: Cup;No straw Medication Administration: Whole meds with puree Supervision: Patient able to self feed Compensations: Slow rate;Small sips/bites;Clear throat intermittently;Minimize environmental distractions Postural Changes and/or Swallow Maneuvers: Seated upright 90 degrees                Oral Care Recommendations: Oral care BID Follow up Recommendations: None SLP Visit Diagnosis: Dysphagia, oropharyngeal phase (R13.12) Plan: Discharge SLP treatment due to (comment);Other (Comment)(goals met)       GO                Dannial Monarch 09/10/2018, 1:11 PM    Sonia Baller, MA, [place] [facility] Acute Rehab Pager: [phone]

## 2018-09-10 NOTE — Progress Notes (Signed)
Hypoglycemic Event  CBG: 61  Treatment: 8 oz juice/soda  Symptoms: None  Follow-up CBG: Time:0810 CBG Result:101  Possible Reasons for Event: Unknown      Luci Bank

## 2018-09-10 NOTE — TOC Transition Note (Signed)
Transition of Care [facility]) - CM/SW Discharge Note   Patient Details  Name: [name] MRN: [id number] Date of Birth: June 27, 1955  [facility] Care [facility]) CM/SW Contact:  Benard Halsted, LCSW Phone Number: 09/10/2018, 2:03 PM   Clinical Narrative:    CSW and RNCM spoke with patient who was oriented and pleasant. He reports that he is ready to discharge home and feels that he will be safe. He states he lives with his son, cousin, and children. He requests a walker since he has been walking in his room. He could not remember their address so gave CSW permission to confirm with his son.  CSW spoke with patient's son, [name]. [name] confirmed that he and other family members will be present in the home at all times with patient and provided address: [address], [place] [address]     Patient's son in agreement to home health services and is familiar with [facility]. They are reviewing referral. Refer to AVS for PCP follow up info. Patient's son will pick patient up when ready for DC-RN please contact son when ready [phone].     Barriers to Discharge: Insurance Authorization   Patient Goals and CMS Choice Patient states their goals for this hospitalization and ongoing recovery are:: "I want to go back home with my sons. But I know I need to go to rehab." CMS Medicare.gov Compare Post Acute Care list provided to:: Patient Choice offered to / list presented to : Patient  Discharge Placement                       Discharge Plan and Services                                     Social Determinants of Health (SDOH) Interventions     Readmission Risk Interventions Readmission Risk Prevention Plan 08/12/2018  Transportation Screening Complete  Medication Review ([place]) Complete  PCP or Specialist appointment within 3-5 days of discharge Complete  HRI or [place] Complete  SW Recovery Care/Counseling Consult Complete  [place] Not Applicable  Some recent data might be hidden

## 2018-09-14 ENCOUNTER — Inpatient Hospital Stay (INDEPENDENT_AMBULATORY_CARE_PROVIDER_SITE_OTHER): Payer: Medicare HMO | Admitting: Primary Care

## 2018-09-15 DIAGNOSIS — B182 Chronic viral hepatitis C: Secondary | ICD-10-CM | POA: Diagnosis not present

## 2018-09-15 DIAGNOSIS — F17210 Nicotine dependence, cigarettes, uncomplicated: Secondary | ICD-10-CM | POA: Diagnosis not present

## 2018-09-15 DIAGNOSIS — E1042 Type 1 diabetes mellitus with diabetic polyneuropathy: Secondary | ICD-10-CM | POA: Diagnosis not present

## 2018-09-15 DIAGNOSIS — J690 Pneumonitis due to inhalation of food and vomit: Secondary | ICD-10-CM | POA: Diagnosis not present

## 2018-09-15 DIAGNOSIS — J9601 Acute respiratory failure with hypoxia: Secondary | ICD-10-CM | POA: Diagnosis not present

## 2018-09-15 DIAGNOSIS — B3781 Candidal esophagitis: Secondary | ICD-10-CM | POA: Diagnosis not present

## 2018-09-15 DIAGNOSIS — K259 Gastric ulcer, unspecified as acute or chronic, without hemorrhage or perforation: Secondary | ICD-10-CM | POA: Diagnosis not present

## 2018-09-15 DIAGNOSIS — I10 Essential (primary) hypertension: Secondary | ICD-10-CM | POA: Diagnosis not present

## 2018-09-15 DIAGNOSIS — R1310 Dysphagia, unspecified: Secondary | ICD-10-CM | POA: Diagnosis not present

## 2018-09-16 ENCOUNTER — Telehealth: Payer: Self-pay

## 2018-09-16 ENCOUNTER — Telehealth: Payer: Self-pay | Admitting: Endocrinology

## 2018-09-16 DIAGNOSIS — B182 Chronic viral hepatitis C: Secondary | ICD-10-CM | POA: Diagnosis not present

## 2018-09-16 DIAGNOSIS — J9601 Acute respiratory failure with hypoxia: Secondary | ICD-10-CM | POA: Diagnosis not present

## 2018-09-16 DIAGNOSIS — J690 Pneumonitis due to inhalation of food and vomit: Secondary | ICD-10-CM | POA: Diagnosis not present

## 2018-09-16 DIAGNOSIS — R1310 Dysphagia, unspecified: Secondary | ICD-10-CM | POA: Diagnosis not present

## 2018-09-16 DIAGNOSIS — E1042 Type 1 diabetes mellitus with diabetic polyneuropathy: Secondary | ICD-10-CM | POA: Diagnosis not present

## 2018-09-16 DIAGNOSIS — F17210 Nicotine dependence, cigarettes, uncomplicated: Secondary | ICD-10-CM | POA: Diagnosis not present

## 2018-09-16 DIAGNOSIS — I10 Essential (primary) hypertension: Secondary | ICD-10-CM | POA: Diagnosis not present

## 2018-09-16 DIAGNOSIS — B3781 Candidal esophagitis: Secondary | ICD-10-CM | POA: Diagnosis not present

## 2018-09-16 DIAGNOSIS — K259 Gastric ulcer, unspecified as acute or chronic, without hemorrhage or perforation: Secondary | ICD-10-CM | POA: Diagnosis not present

## 2018-09-16 NOTE — Telephone Encounter (Signed)
Please be on the look out for this information. Pt is scheduled for f/u tomorrow. Thank you

## 2018-09-16 NOTE — Telephone Encounter (Signed)
Dr. Loanne Drilling states he is not pt PCP. HH and PT need to contact new PCP to make them aware as well as sign any orders or the Plan Of Care.

## 2018-09-16 NOTE — Telephone Encounter (Signed)
The pt has been scheduled for follow up on 6/30 to discuss with Dr Rush Landmark

## 2018-09-16 NOTE — Telephone Encounter (Signed)
[facility] Erlene Quan calling [phone] Sending over the plan of care

## 2018-09-16 NOTE — Telephone Encounter (Signed)
-----   Message from Timothy Lasso, RN sent at 08/26/2018  8:51 AM EDT -----  ----- Message ----- From: Irving Copas., MD Sent: 08/25/2018   6:13 PM EDT To: Vena Rua, PA-C, Timothy Lasso, RN  [name], Patient needs EGD follow up in 8-weeks in the hospital. He will remain in hospital for next week at least. When able to reach out, would reach out to son to help coordinate care. Hospital follow up Telemedicine in 4-weeks. Thanks. GM

## 2018-09-16 NOTE — Telephone Encounter (Signed)
He should f/u dm, but the form goes to PCP.

## 2018-09-16 NOTE — Telephone Encounter (Signed)
Home Physical Therapy reached out to inform that the patients therapy will be pushed out due to not being able to get in contact with the patient.

## 2018-09-17 ENCOUNTER — Ambulatory Visit: Payer: Medicare HMO | Admitting: Endocrinology

## 2018-09-18 DIAGNOSIS — F17210 Nicotine dependence, cigarettes, uncomplicated: Secondary | ICD-10-CM | POA: Diagnosis not present

## 2018-09-18 DIAGNOSIS — J9601 Acute respiratory failure with hypoxia: Secondary | ICD-10-CM | POA: Diagnosis not present

## 2018-09-18 DIAGNOSIS — B3781 Candidal esophagitis: Secondary | ICD-10-CM | POA: Diagnosis not present

## 2018-09-18 DIAGNOSIS — J690 Pneumonitis due to inhalation of food and vomit: Secondary | ICD-10-CM | POA: Diagnosis not present

## 2018-09-18 DIAGNOSIS — E1042 Type 1 diabetes mellitus with diabetic polyneuropathy: Secondary | ICD-10-CM | POA: Diagnosis not present

## 2018-09-18 DIAGNOSIS — I10 Essential (primary) hypertension: Secondary | ICD-10-CM | POA: Diagnosis not present

## 2018-09-18 DIAGNOSIS — R1310 Dysphagia, unspecified: Secondary | ICD-10-CM | POA: Diagnosis not present

## 2018-09-18 DIAGNOSIS — B182 Chronic viral hepatitis C: Secondary | ICD-10-CM | POA: Diagnosis not present

## 2018-09-18 DIAGNOSIS — K259 Gastric ulcer, unspecified as acute or chronic, without hemorrhage or perforation: Secondary | ICD-10-CM | POA: Diagnosis not present

## 2018-09-19 ENCOUNTER — Emergency Department (HOSPITAL_COMMUNITY): Payer: Medicare HMO

## 2018-09-19 ENCOUNTER — Emergency Department (HOSPITAL_COMMUNITY)
Admission: EM | Admit: 2018-09-19 | Discharge: 2018-09-19 | Disposition: A | Discharge status: Home / Self Care | Payer: Medicare HMO | Attending: Emergency Medicine | Admitting: Emergency Medicine

## 2018-09-19 ENCOUNTER — Encounter (HOSPITAL_COMMUNITY): Payer: Self-pay | Admitting: Emergency Medicine

## 2018-09-19 ENCOUNTER — Other Ambulatory Visit: Payer: Self-pay

## 2018-09-19 DIAGNOSIS — G629 Polyneuropathy, unspecified: Secondary | ICD-10-CM | POA: Diagnosis not present

## 2018-09-19 DIAGNOSIS — E103599 Type 1 diabetes mellitus with proliferative diabetic retinopathy without macular edema, unspecified eye: Secondary | ICD-10-CM | POA: Diagnosis not present

## 2018-09-19 DIAGNOSIS — Z20828 Contact with and (suspected) exposure to other viral communicable diseases: Secondary | ICD-10-CM | POA: Insufficient documentation

## 2018-09-19 DIAGNOSIS — E10649 Type 1 diabetes mellitus with hypoglycemia without coma: Secondary | ICD-10-CM | POA: Diagnosis not present

## 2018-09-19 DIAGNOSIS — E871 Hypo-osmolality and hyponatremia: Secondary | ICD-10-CM | POA: Insufficient documentation

## 2018-09-19 DIAGNOSIS — R110 Nausea: Secondary | ICD-10-CM | POA: Diagnosis not present

## 2018-09-19 DIAGNOSIS — R5383 Other fatigue: Secondary | ICD-10-CM | POA: Insufficient documentation

## 2018-09-19 DIAGNOSIS — Z79899 Other long term (current) drug therapy: Secondary | ICD-10-CM | POA: Insufficient documentation

## 2018-09-19 DIAGNOSIS — R112 Nausea with vomiting, unspecified: Secondary | ICD-10-CM | POA: Diagnosis not present

## 2018-09-19 DIAGNOSIS — R1111 Vomiting without nausea: Secondary | ICD-10-CM | POA: Diagnosis not present

## 2018-09-19 DIAGNOSIS — F17210 Nicotine dependence, cigarettes, uncomplicated: Secondary | ICD-10-CM | POA: Diagnosis not present

## 2018-09-19 DIAGNOSIS — I10 Essential (primary) hypertension: Secondary | ICD-10-CM | POA: Diagnosis not present

## 2018-09-19 DIAGNOSIS — E1040 Type 1 diabetes mellitus with diabetic neuropathy, unspecified: Secondary | ICD-10-CM | POA: Diagnosis not present

## 2018-09-19 DIAGNOSIS — J449 Chronic obstructive pulmonary disease, unspecified: Secondary | ICD-10-CM | POA: Diagnosis not present

## 2018-09-19 DIAGNOSIS — R52 Pain, unspecified: Secondary | ICD-10-CM | POA: Diagnosis not present

## 2018-09-19 DIAGNOSIS — E162 Hypoglycemia, unspecified: Secondary | ICD-10-CM

## 2018-09-19 DIAGNOSIS — E1029 Type 1 diabetes mellitus with other diabetic kidney complication: Secondary | ICD-10-CM | POA: Insufficient documentation

## 2018-09-19 DIAGNOSIS — I959 Hypotension, unspecified: Secondary | ICD-10-CM | POA: Diagnosis not present

## 2018-09-19 LAB — CBC WITH DIFFERENTIAL/PLATELET
Abs Immature Granulocytes: 0.1 10*3/uL — ABNORMAL HIGH (ref 0.00–0.07)
Basophils Absolute: 0.1 10*3/uL (ref 0.0–0.1)
Basophils Relative: 1 %
Eosinophils Absolute: 0.4 10*3/uL (ref 0.0–0.5)
Eosinophils Relative: 4 %
HCT: 30.9 % — ABNORMAL LOW (ref 39.0–52.0)
Hemoglobin: 9.7 g/dL — ABNORMAL LOW (ref 13.0–17.0)
Immature Granulocytes: 1 %
Lymphocytes Relative: 23 %
Lymphs Abs: 2.2 10*3/uL (ref 0.7–4.0)
MCH: 28.2 pg (ref 26.0–34.0)
MCHC: 31.4 g/dL (ref 30.0–36.0)
MCV: 89.8 fL (ref 80.0–100.0)
Monocytes Absolute: 0.7 10*3/uL (ref 0.1–1.0)
Monocytes Relative: 8 %
Neutro Abs: 6 10*3/uL (ref 1.7–7.7)
Neutrophils Relative %: 63 %
Platelets: 545 10*3/uL — ABNORMAL HIGH (ref 150–400)
RBC: 3.44 MIL/uL — ABNORMAL LOW (ref 4.22–5.81)
RDW: 14.3 % (ref 11.5–15.5)
WBC: 9.5 10*3/uL (ref 4.0–10.5)
nRBC: 0 % (ref 0.0–0.2)

## 2018-09-19 LAB — URINALYSIS, ROUTINE W REFLEX MICROSCOPIC
Bacteria, UA: NONE SEEN
Bilirubin Urine: NEGATIVE
Glucose, UA: >=500 mg/dL — AB
Ketones, ur: NEGATIVE mg/dL
Leukocytes,Ua: NEGATIVE
Nitrite: NEGATIVE
Protein, ur: 100 mg/dL — AB
Specific Gravity, Urine: 1.013 (ref 1.005–1.030)
pH: 6 (ref 5.0–8.0)

## 2018-09-19 LAB — COMPREHENSIVE METABOLIC PANEL
ALT: 47 U/L — ABNORMAL HIGH (ref 0–44)
AST: 47 U/L — ABNORMAL HIGH (ref 15–41)
Albumin: 2.9 g/dL — ABNORMAL LOW (ref 3.5–5.0)
Alkaline Phosphatase: 147 U/L — ABNORMAL HIGH (ref 38–126)
Anion gap: 6 (ref 5–15)
BUN: 23 mg/dL (ref 8–23)
CO2: 24 mmol/L (ref 22–32)
Calcium: 8.4 mg/dL — ABNORMAL LOW (ref 8.9–10.3)
Chloride: 98 mmol/L (ref 98–111)
Creatinine, Ser: 1.09 mg/dL (ref 0.61–1.24)
GFR calc Af Amer: >60 mL/min (ref >60)
GFR calc non Af Amer: >60 mL/min (ref >60)
Glucose, Bld: 37 mg/dL — CL (ref 70–99)
Potassium: 3.5 mmol/L (ref 3.5–5.1)
Sodium: 128 mmol/L — ABNORMAL LOW (ref 135–145)
Total Bilirubin: 0.3 mg/dL (ref 0.3–1.2)
Total Protein: 7.2 g/dL (ref 6.5–8.1)

## 2018-09-19 LAB — RAPID URINE DRUG SCREEN, HOSP PERFORMED
Amphetamines: NOT DETECTED
Barbiturates: NOT DETECTED
Benzodiazepines: NOT DETECTED
Cocaine: NOT DETECTED
Opiates: NOT DETECTED
Tetrahydrocannabinol: POSITIVE — AB

## 2018-09-19 LAB — CBG MONITORING, ED
Glucose-Capillary: 30 mg/dL — CL (ref 70–99)
Glucose-Capillary: 134 mg/dL — ABNORMAL HIGH (ref 70–99)
Glucose-Capillary: 26 mg/dL — CL (ref 70–99)
Glucose-Capillary: 149 mg/dL — ABNORMAL HIGH (ref 70–99)

## 2018-09-19 LAB — SARS CORONAVIRUS 2 BY RT PCR (HOSPITAL ORDER, PERFORMED IN ~~LOC~~ HOSPITAL LAB): SARS Coronavirus 2: NEGATIVE

## 2018-09-19 LAB — AMMONIA: Ammonia: 19 umol/L (ref 9–35)

## 2018-09-19 LAB — ETHANOL: Alcohol, Ethyl (B): <10 mg/dL (ref <10)

## 2018-09-19 MED ORDER — SODIUM CHLORIDE 0.9 % IV BOLUS
1000.0000 mL | Freq: Once | INTRAVENOUS | Status: AC
  Administered 2018-09-19: 1000 mL via INTRAVENOUS

## 2018-09-19 MED ORDER — ACETAMINOPHEN 500 MG PO TABS
500.0000 mg | ORAL_TABLET | Freq: Once | ORAL | Status: DC
  Filled 2018-09-19: qty 1

## 2018-09-19 MED ORDER — PREGABALIN 50 MG PO CAPS
50.0000 mg | ORAL_CAPSULE | Freq: Once | ORAL | Status: AC
  Administered 2018-09-19: 18:00:00 50 mg via ORAL
  Filled 2018-09-19: qty 1

## 2018-09-19 MED ORDER — DEXTROSE 50 % IV SOLN
12.5000 g | Freq: Once | INTRAVENOUS | Status: DC
  Filled 2018-09-19: qty 50

## 2018-09-19 MED ORDER — DEXTROSE 50 % IV SOLN
50.0000 mL | Freq: Once | INTRAVENOUS | Status: AC
  Administered 2018-09-19: 18:00:00 50 mL via INTRAVENOUS

## 2018-09-19 NOTE — ED Provider Notes (Signed)
[facility] DEPT Provider Note   CSN: [id number] Arrival date & time: 09/19/18  1605    History   Chief Complaint Chief Complaint  Patient presents with  . Hypoglycemia  . Nausea  . Emesis    HPI [name] is a 63 y.o. male.     The history is provided by the patient and medical records. No language interpreter was used.  Hypoglycemia  Associated symptoms: no vomiting and no weakness   Emesis  Associated symptoms: myalgias   Associated symptoms: no abdominal pain    [name] is a 63 y.o. male  with a PMH as listed below including DM1 who presents to the Emergency Department complaining of decreased appetite.  He is also complaining of his chronic neuropathy, stating that he has been unable to take his Lyrica at home because no one will bring it to him.  He denies any acute worsening of his bilateral neuropathy in his legs.  He denies any leg swelling or new back pain.  No urinary or bowel incontinence.  No fevers.  Patient states that he has 2 social workers trying to get him placed in a nursing facility because he cannot care for himself at home.  Endorses nausea, but no vomiting to me.  Denies abdominal pain.  He apparently had CBG of 101 with EMS. CBG of 30 today with me.  He states that he uses 70/30 and took his last dose of this about 3 hours ago.  He appears to have very little insight into his medications and how to use them correctly.  He tells me that he is supposed to take his 70/30 on a sliding scale, but does not know what the scale is supposed to be.  He states that he normally just gives himself about 30 to 40 units at some point during the day.  Per chart review, it appears he is supposed to use 15U in the morning and 10U in the evening.   Past Medical History:  Diagnosis Date  . DEPRESSION   . DIABETES MELLITUS, TYPE I   . DRUG ABUSE    pt should have NO controlled substances rx'ed  . Glaucoma   . HEPATITIS C    chronic  .  Hypertension 02/18/2011  . 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 Vitiligo     Patient Active Problem List   Diagnosis Date Noted  . Acute respiratory failure ([place])   . Altered mental status   . Gastroesophageal reflux disease   . Hallucinations   . Hypoglycemia associated with diabetes ([place]) 08/12/2018  . At risk for adverse drug event 02/17/2018  . Abrasions of multiple sites   . Demand ischemia of myocardium ([place]) 02/05/2018  . Acute metabolic encephalopathy 29/79/8921  . DKA (diabetic ketoacidoses) ([place]) 02/03/2018  . Leukocytosis 02/03/2018  . Hypoglycemia 12/24/2017  . Syncope 12/24/2017  . Syncope and collapse 12/23/2017  . IV drug abuse ([place]) 04/19/2017  . Acute encephalopathy 04/19/2017  . Hemangioma 10/02/2016  . Foreign body (FB) in soft tissue   . 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[place])   . Gangrene ([place])   . Type I (juvenile type) diabetes mellitus without mention of complication, not stated as uncontrolled   . Tobacco abuse   . Essential hypertension   . Diabetic infection of right foot ([place]) 07/22/2016  . Diabetic foot infection ([place]) 07/22/2016  . Chronic ulcer of heel, right, with fat layer exposed ([place]) 05/20/2016  . DKA, type  1 ([place]) 04/18/2016  . AKI (acute kidney injury) ([place]) 04/18/2016  . Hyponatremia 04/18/2016  . Severe protein-calorie malnutrition ([place]) 04/18/2016  . Elevated troponin 04/18/2016  . Poor dentition 07/12/2015  . Tobacco use 07/06/2013  . PAD (peripheral artery disease) ([place]) 04/29/2013  . COPD, mild ([place])   . 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 Diabetic neuropathy ([place]) 06/21/2010  . PROLIFERATIVE DIABETIC RETINOPATHY 06/21/2010  . SKIN TAG 01/30/2010  . Chronic hepatitis C ([place]) 11/22/2008  . VITILIGO 11/22/2008  . PROTEINURIA, MILD 11/22/2008  . Substance abuse ([place]) 04/23/2007  . DEPRESSION 11/11/2006    Past Surgical History:  Procedure Laterality Date  .  ABDOMINAL AORTOGRAM W/LOWER EXTREMITY N/A 07/25/2016   Procedure: Abdominal Aortogram w/Bilateral Lower Extremity Runoff;  Surgeon: Conrad [place], MD;  Location: [facility];  Service: Cardiovascular;  Laterality: N/A;  . ABDOMINAL AORTOGRAM W/LOWER EXTREMITY N/A 12/24/2016   Procedure: ABDOMINAL AORTOGRAM W/LOWER EXTREMITY;  Surgeon: Serafina Mitchell, MD;  Location: [facility];  Service: Cardiovascular;  Laterality: N/A;  . AMPUTATION TOE Right 07/26/2016   Procedure: AMPUTATION GREAT TOE;  Surgeon: Serafina Mitchell, MD;  Location: [facility];  Service: Vascular;  Laterality: Right;  . BIOPSY  08/25/2018   Procedure: BIOPSY;  Surgeon: Irving Copas., MD;  Location: [facility];  Service: Gastroenterology;;  . ESOPHAGOGASTRODUODENOSCOPY N/A 08/25/2018   Procedure: ESOPHAGOGASTRODUODENOSCOPY (EGD);  Surgeon: Irving Copas., MD;  Location: [facility];  Service: Gastroenterology;  Laterality: N/A;  . EYE SURGERY     retinal surgery x 2, right eye  . INSERTION OF ILIAC STENT Right 07/26/2016   Procedure: INSERTION OF RIGHT POPLITEAL STENT WITH BALLOON ANGIOPLASTY;  Surgeon: Serafina Mitchell, MD;  Location: [facility];  Service: Vascular;  Laterality: Right;  . LOWER EXTREMITY ANGIOGRAM Right 07/26/2016   Procedure: LOWER EXTREMITY ANGIOGRAM;  Surgeon: Serafina Mitchell, MD;  Location: [facility];  Service: Vascular;  Laterality: Right;  . PERIPHERAL VASCULAR BALLOON ANGIOPLASTY Right 07/26/2016   Procedure: PERIPHERAL VASCULAR BALLOON ANGIOPLASTY  RIGHT ANTERIOR TIBEAL ARTERY AND RIGHT SUPERFICIAL FEMORAL ARTERY;  Surgeon: Serafina Mitchell, MD;  Location: [facility];  Service: Vascular;  Laterality: Right;  . PERIPHERAL VASCULAR INTERVENTION Right 12/24/2016   Procedure: PERIPHERAL VASCULAR INTERVENTION;  Surgeon: Serafina Mitchell, MD;  Location: [facility];  Service: Cardiovascular;  Laterality: Right;  lower extr        Home Medications    Prior to Admission medications    Medication Sig Start Date End Date Taking? Authorizing Provider  Accu-Chek Softclix Lancets lancets Use to monitor glucose levels BID; E10.29 08/19/18   Renato Shin, MD  amLODipine (NORVASC) 10 MG tablet Take 1 tablet (10 mg total) by mouth daily for 30 days. 08/15/18 09/14/18  Mikhail, Velta Addison, DO  Blood Glucose Calibration (ACCU-CHEK AVIVA) SOLN 1 each by In Vitro route as needed. Use to calibrate meter prn 08/19/18   Renato Shin, MD  blood glucose meter kit and supplies KIT Dispense based on patient and insurance preference. Use up to four times daily as directed. (FOR ICD-9 250.00, 250.01). 08/15/18   Cristal Ford, DO  Blood Glucose Monitoring Suppl (ACCU-CHEK AVIVA PLUS) w/Device KIT 1 each by Does not apply route 2 (two) times a day. Use to monitor glucose levels BID; E10.29 08/19/18   Renato Shin, MD  feeding supplement, ENSURE ENLIVE, (ENSURE ENLIVE) LIQD Take 237 mLs by mouth 3 (three) times daily between meals. 09/10/18  Barton Dubois, MD  folic acid (FOLVITE) 1 MG tablet Take 1 tablet (1 mg total) by mouth daily. 09/11/18   Barton Dubois, MD  glucose blood (ACCU-CHEK AVIVA PLUS) test strip Use to monitor glucose levels BID; E10.29 08/19/18   Renato Shin, MD  insulin aspart protamine - aspart (NOVOLOG 70/30 MIX) (70-30) 100 UNIT/ML FlexPen Inject 15 units every morning and 10 units every evening. 08/15/18   Mikhail, Velta Addison, DO  Insulin Pen Needle 31G X 5 MM MISC Use with novolog flex pen 08/15/18   Cristal Ford, DO  Isopropyl Alcohol Wipes 70 % MISC Apply 1 each topically daily as needed. 02/27/18   Medina-Vargas, Monina C, NP  nicotine (NICODERM CQ - DOSED IN MG/24 HOURS) 21 mg/24hr patch Place 1 patch (21 mg total) onto the skin daily. 08/16/18   Mikhail, Velta Addison, DO  pantoprazole (PROTONIX) 40 MG tablet Take 1 tablet (40 mg total) by mouth 2 (two) times daily. 09/10/18   Barton Dubois, MD  pregabalin (LYRICA) 50 MG capsule Take 1 capsule (50 mg total) by mouth 2 (two) times daily. 09/10/18    Barton Dubois, MD  risperiDONE (RISPERDAL) 0.5 MG tablet Take 0.5 tablets (0.25 mg total) by mouth 2 (two) times daily. 09/10/18   Barton Dubois, MD  sucralfate (CARAFATE) 1 GM/10ML suspension Take 10 mLs (1 g total) by mouth 4 (four) times daily -  with meals and at bedtime for 12 days. 09/10/18 09/22/18  Barton Dubois, MD    Family History Family History  Problem Relation Age of Onset  . Diabetes Father   . Kidney disease Father   . Arthritis Mother   . Heart disease Mother        CAD    Social History Social History   Tobacco Use  . Smoking status: Current Every Day Smoker    Packs/day: 1.00    Years: 51.00    Pack years: 51.00    Types: Cigarettes  . Smokeless tobacco: Former Systems developer    Types: Chew    Quit date: 1985  Substance Use Topics  . Alcohol use: No    Alcohol/week: 0.0 standard drinks    Comment: 08/14/14 none  . Drug use: Yes    Types: Marijuana, Heroin, Cocaine, "Crack" cocaine, Fentanyl, Methylphenidate    Comment: s/p rehab x 6, occasional drug use (per pt last  use was 06/2015)     Allergies   Codeine   Review of Systems Review of Systems  Constitutional: Positive for appetite change (Decreased).  Gastrointestinal: Positive for nausea. Negative for abdominal pain and vomiting.  Musculoskeletal: Positive for myalgias.  Neurological: Negative for weakness and numbness.     Physical Exam Updated Vital Signs BP (!) 152/66   Pulse 82   Temp 98.4 F (36.9 C) (Oral)   Resp 15   Ht 6' (1.829 m)   Wt 61.7 kg   SpO2 99%   BMI 18.44 kg/m   Physical Exam Vitals signs and nursing note reviewed.  Constitutional:      General: He is not in acute distress.    Appearance: He is well-developed.  HENT:     Head: Normocephalic and atraumatic.     Mouth/Throat:     Comments: Dry cracked lips Neck:     Musculoskeletal: Neck supple.  Cardiovascular:     Rate and Rhythm: Normal rate and regular rhythm.     Heart sounds: Normal heart sounds. No murmur.   Pulmonary:     Effort: Pulmonary effort is normal. No  respiratory distress.     Breath sounds: Normal breath sounds.  Abdominal:     General: There is no distension.     Palpations: Abdomen is soft.     Comments: No abdominal tenderness.  Musculoskeletal:     Comments: 5/5 muscle strength and full ROM in all four extremities.  Skin:    General: Skin is warm and dry.  Neurological:     Mental Status: He is alert and oriented to person, place, and time.     Comments: Speech clear and goal oriented. CN 2-12 grossly intact. Normal finger-to-nose and rapid alternating movements. Sensation equal and intact x4.      ED Treatments / Results  Labs (all labs ordered are listed, but only abnormal results are displayed) Labs Reviewed  CBC WITH DIFFERENTIAL/PLATELET - Abnormal; Notable for the following components:      Result Value   RBC 3.44 (*)    Hemoglobin 9.7 (*)    HCT 30.9 (*)    Platelets 545 (*)    Abs Immature Granulocytes 0.10 (*)    All other components within normal limits  COMPREHENSIVE METABOLIC PANEL - Abnormal; Notable for the following components:   Sodium 128 (*)    Glucose, Bld 37 (*)    Calcium 8.4 (*)    Albumin 2.9 (*)    AST 47 (*)    ALT 47 (*)    Alkaline Phosphatase 147 (*)    All other components within normal limits  URINALYSIS, ROUTINE W REFLEX MICROSCOPIC - Abnormal; Notable for the following components:   Glucose, UA >=500 (*)    Hgb urine dipstick MODERATE (*)    Protein, ur 100 (*)    All other components within normal limits  RAPID URINE DRUG SCREEN, HOSP PERFORMED - Abnormal; Notable for the following components:   Tetrahydrocannabinol POSITIVE (*)    All other components within normal limits  CBG MONITORING, ED - Abnormal; Notable for the following components:   Glucose-Capillary 30 (*)    All other components within normal limits  CBG MONITORING, ED - Abnormal; Notable for the following components:   Glucose-Capillary 26 (*)    All other  components within normal limits  CBG MONITORING, ED - Abnormal; Notable for the following components:   Glucose-Capillary 134 (*)    All other components within normal limits  CBG MONITORING, ED - Abnormal; Notable for the following components:   Glucose-Capillary 149 (*)    All other components within normal limits  SARS CORONAVIRUS 2 (HOSPITAL ORDER, [facility])  ETHANOL  AMMONIA    EKG None  Radiology Dg Chest 2 View  Result Date: 09/19/2018 CLINICAL DATA:  Neuropathy, nausea, vomiting EXAM: CHEST - 2 VIEW COMPARISON:  09/05/2018 FINDINGS: Cardiomegaly. There is persistent although improved heterogeneous airspace opacity of the left lung base compared to prior examination. IMPRESSION: Cardiomegaly. There is persistent although improved heterogeneous airspace opacity of the left lung base compared to prior examination, in keeping with resolving infection or aspiration. Electronically Signed   By: Eddie Candle M.D.   On: 09/19/2018 19:00    Procedures Procedures (including critical care time)  Medications Ordered in ED Medications  sodium chloride 0.9 % bolus 1,000 mL (1,000 mLs Intravenous New Bag/Given 09/19/18 1742)  dextrose 50 % solution 50 mL (50 mLs Intravenous Given 09/19/18 1741)  pregabalin (LYRICA) capsule 50 mg (50 mg Oral Given 09/19/18 1811)     Initial Impression / Assessment and Plan / ED Course  I  have reviewed the triage vital signs and the nursing notes.  Pertinent labs & imaging results that were available during my care of the patient were reviewed by me and considered in my medical decision making (see chart for details).       [name] is a 63 y.o. male who presents to ED from [facility]. He reports that he is here for his bilateral leg neuropathy which is very painful for him. Appears quite agitated. CBG was obtained and was 30. Given orange juice and sandwich. Repeat cbg 29, so then given amp of d50 as well as apple sauce and more  juice.  Will obtain labs, ua, cxr to further evaluate hypoglycemia. He has no red flag symptoms of back pain. Neuropathy seems like a chronic issue. He is working with social work to get home PT / [facility] which I think he very much needs. I have consulted social work here as well - hopefully they can reach out with him tomorrow to see if there is anything further they can offer. He would really like to go to rehab facility to get strength and mobility back if possible.   Agitation seemed to improve now that CBG is improved. CBG's have been holding steady in the 120-140 range for a few hours now. He is tolerating PO fine. Benign abdominal exam.   Remainder of labs reviewed: sodium of 128 . Remainder baseline.   Given 1L fluid bolus in ED.   CXR independently reviewed by myself and attending, Dr. Ralene Bathe. Opacity is very much improved from prior cxr. Lungs sound good. Do not feel he needs tx for this at this time as it is improving.   Evaluation does not show pathology that would require ongoing emergent intervention or inpatient treatment.  PCP follow up encouraged. Return precautions discussed. Alll questions answered.   Patient seen by and discussed with Dr. Ralene Bathe who agrees with treatment plan.    Final Clinical Impressions(s) / ED Diagnoses   Final diagnoses:  Fatigue  Hypoglycemia    ED Discharge Orders    None       Safina Huard, Ozella Almond, PA-C 09/19/18 2039    Quintella Reichert, MD 09/20/18 1302

## 2018-09-19 NOTE — ED Notes (Signed)
Pt is upset and verbally aggressive toward staff.  Pt demanded food. Advised pt that he must be assessed first.  Pt continues to be verbally aggressive.

## 2018-09-19 NOTE — Discharge Instructions (Signed)
Keep a close eye on your blood sugars. You should be eating at least 3 meals a day and checking your blood sugar 3 times a day as well.   I have consulted our Education officer, museum. They should give you a call tomorrow.   Return to ER for new or worsening symptoms, any additional concerns.

## 2018-09-19 NOTE — ED Notes (Signed)
EMT states patient is being verbally abusive to staff-throwing vomit bag at staff and on the floor-had security go speak with patient about behavior towards staff

## 2018-09-19 NOTE — ED Notes (Addendum)
Pt was heard dry heaving/vomiting in his room from nurses station.  Went in to check on him and provided him with an emesis bag, which he spit into and then handed back. Pt continues to be verbally abusive and aggressive towards staff. Gave pt a new emesis bag before leaving the room to obtain the glucometer and the pt slung the bag onto the floor.  When I returned to the room to check his CBG, pt advised that he needed a bag to vomit into.  Advised the pt that I had given him one and he threw it on the ground.  Pt became increasingly agitated and verbally abusive. Called for security, who spoke with the pt about the need for his attitude and behavior towards staff to improve.

## 2018-09-19 NOTE — ED Notes (Signed)
Accompanied RN during discharge, along with Neurosurgeon.  Pt continued to be agitated and verbally abusive towards all staff.  Pt stated that he didn't have a way home because his son wouldn't answer the phone and was angry that we were discharging him with no one to pick him up.  Once the pt realized that he was, in fact, being discharged, he complained of chest pain one time and then continued to verbally berate staff.  Pt stood from bed with his walker without assistance.  Pt was instructed that his lighter was on the bedside table, which he obtained after slinging items around.  Reminded pt to grab his discharge paperwork, also on the bedside table, to which he responded "hell no I don't want that" while continually mumbling quietly about his disbelief that "yall bitches" were discharging him. Pt was escorted off the property by security personnel.

## 2018-09-19 NOTE — ED Notes (Signed)
Provided pt with a sandwich and juice due to hypoglycemia.

## 2018-09-19 NOTE — ED Notes (Addendum)
During the patient visit, his behavior and verbal aggression toward staff members had to be addressed multiple times by multiple staff memebers. He was warned by security that if he was unwilling to correct his behavior he would have to leave. Patient would self correct momentarily, but then would again return to the previous behavior pattern. Prior to discharge, the PA attempted to call the patient's son. The patient's son did not pick up the phone. Patient was medically cleared. Due to the patient's behavior during the visit, security was called to be present while patient was being discharged out. Once the patient was told he was being discharged he became very upset and was once again verbally aggressive to staff. Patient wanted to be placed at [facility] and was upset he was being sent home. While the RN attempted to get vitals the patient gave the RN "the middle finger". He demanded we call his son. He was informed that there had been an attempt to reach his son but his son was not picking up the phone. He then demanded that we find him a ride home. He was told he could continue to call for a ride in the lobby of the hospital. During the discharge process he continued to be verbally aggressive, make demands and curse at staff. After he saw he was still going to be escorted out, the patient then said his chest hurt (though this was not a complaint at any time during his visit) and then immediately demanded we find a ride home for him as if he had not just made previous claims. The patient then got up from the bed without assistance and used his walker to walk out escorted by security. He was reminded to take his discharge papers, by the EMT. Instead of taking his his paper he aggressively told her he refused. While walking out he still remained verbally abusive to staff. Due to his disruptive behavior, the patient was escorted out the ambulance bay and directed back to the lobby by security to make phone calls.

## 2018-09-19 NOTE — ED Triage Notes (Signed)
Patient was picked up from [facility] and complains off low PO intake for the past 5 days. He reports feeling as if he is about to die. He also complains of bilateral leg neuropathy, nausea and vomiting. He also reported to EMS that he would like placement in a nursing home where he can be fed and bathed.     EMS vitals: 112/64 BP 84 HR 97 % O2 sats on room air 101 CBG  99.0 Temp

## 2018-09-20 DIAGNOSIS — K259 Gastric ulcer, unspecified as acute or chronic, without hemorrhage or perforation: Secondary | ICD-10-CM | POA: Diagnosis not present

## 2018-09-20 DIAGNOSIS — J9601 Acute respiratory failure with hypoxia: Secondary | ICD-10-CM | POA: Diagnosis not present

## 2018-09-20 DIAGNOSIS — E1042 Type 1 diabetes mellitus with diabetic polyneuropathy: Secondary | ICD-10-CM | POA: Diagnosis not present

## 2018-09-20 DIAGNOSIS — I10 Essential (primary) hypertension: Secondary | ICD-10-CM | POA: Diagnosis not present

## 2018-09-20 DIAGNOSIS — F17210 Nicotine dependence, cigarettes, uncomplicated: Secondary | ICD-10-CM | POA: Diagnosis not present

## 2018-09-20 DIAGNOSIS — R1310 Dysphagia, unspecified: Secondary | ICD-10-CM | POA: Diagnosis not present

## 2018-09-20 DIAGNOSIS — B182 Chronic viral hepatitis C: Secondary | ICD-10-CM | POA: Diagnosis not present

## 2018-09-20 DIAGNOSIS — J690 Pneumonitis due to inhalation of food and vomit: Secondary | ICD-10-CM | POA: Diagnosis not present

## 2018-09-20 DIAGNOSIS — B3781 Candidal esophagitis: Secondary | ICD-10-CM | POA: Diagnosis not present

## 2018-09-21 DIAGNOSIS — E1042 Type 1 diabetes mellitus with diabetic polyneuropathy: Secondary | ICD-10-CM | POA: Diagnosis not present

## 2018-09-21 DIAGNOSIS — R1310 Dysphagia, unspecified: Secondary | ICD-10-CM | POA: Diagnosis not present

## 2018-09-21 DIAGNOSIS — K259 Gastric ulcer, unspecified as acute or chronic, without hemorrhage or perforation: Secondary | ICD-10-CM | POA: Diagnosis not present

## 2018-09-21 DIAGNOSIS — J9601 Acute respiratory failure with hypoxia: Secondary | ICD-10-CM | POA: Diagnosis not present

## 2018-09-21 DIAGNOSIS — I10 Essential (primary) hypertension: Secondary | ICD-10-CM | POA: Diagnosis not present

## 2018-09-21 DIAGNOSIS — J690 Pneumonitis due to inhalation of food and vomit: Secondary | ICD-10-CM | POA: Diagnosis not present

## 2018-09-21 DIAGNOSIS — F17210 Nicotine dependence, cigarettes, uncomplicated: Secondary | ICD-10-CM | POA: Diagnosis not present

## 2018-09-21 DIAGNOSIS — B182 Chronic viral hepatitis C: Secondary | ICD-10-CM | POA: Diagnosis not present

## 2018-09-21 DIAGNOSIS — B3781 Candidal esophagitis: Secondary | ICD-10-CM | POA: Diagnosis not present

## 2018-09-22 ENCOUNTER — Telehealth: Payer: Self-pay | Admitting: Endocrinology

## 2018-09-22 DIAGNOSIS — J690 Pneumonitis due to inhalation of food and vomit: Secondary | ICD-10-CM | POA: Diagnosis not present

## 2018-09-22 DIAGNOSIS — K259 Gastric ulcer, unspecified as acute or chronic, without hemorrhage or perforation: Secondary | ICD-10-CM | POA: Diagnosis not present

## 2018-09-22 DIAGNOSIS — I10 Essential (primary) hypertension: Secondary | ICD-10-CM | POA: Diagnosis not present

## 2018-09-22 DIAGNOSIS — E1042 Type 1 diabetes mellitus with diabetic polyneuropathy: Secondary | ICD-10-CM | POA: Diagnosis not present

## 2018-09-22 DIAGNOSIS — J9601 Acute respiratory failure with hypoxia: Secondary | ICD-10-CM | POA: Diagnosis not present

## 2018-09-22 DIAGNOSIS — R1310 Dysphagia, unspecified: Secondary | ICD-10-CM | POA: Diagnosis not present

## 2018-09-22 DIAGNOSIS — B182 Chronic viral hepatitis C: Secondary | ICD-10-CM | POA: Diagnosis not present

## 2018-09-22 DIAGNOSIS — B3781 Candidal esophagitis: Secondary | ICD-10-CM | POA: Diagnosis not present

## 2018-09-22 DIAGNOSIS — F17210 Nicotine dependence, cigarettes, uncomplicated: Secondary | ICD-10-CM | POA: Diagnosis not present

## 2018-09-22 NOTE — Telephone Encounter (Signed)
[name] is patients Education officer, museum from [facility] is calling for an FL2.so that patient can get in to a short term care facility for mobility assistance.  FL2 form can be found via google.com  If completed can be return by email:  [email or web address]  Call back number is [phone]

## 2018-09-22 NOTE — Telephone Encounter (Signed)
This is the link to the online form for fill out  [email or web address]  Per social worker patient is requesting skilled nursing facility for assistance

## 2018-09-23 ENCOUNTER — Other Ambulatory Visit: Payer: Self-pay

## 2018-09-23 ENCOUNTER — Emergency Department (HOSPITAL_COMMUNITY)
Admission: EM | Admit: 2018-09-23 | Discharge: 2018-09-24 | Disposition: A | Discharge status: Home / Self Care | Payer: Medicare HMO | Attending: Emergency Medicine | Admitting: Emergency Medicine

## 2018-09-23 ENCOUNTER — Encounter (HOSPITAL_COMMUNITY): Payer: Self-pay

## 2018-09-23 DIAGNOSIS — F17210 Nicotine dependence, cigarettes, uncomplicated: Secondary | ICD-10-CM | POA: Insufficient documentation

## 2018-09-23 DIAGNOSIS — I10 Essential (primary) hypertension: Secondary | ICD-10-CM | POA: Insufficient documentation

## 2018-09-23 DIAGNOSIS — J9601 Acute respiratory failure with hypoxia: Secondary | ICD-10-CM | POA: Diagnosis not present

## 2018-09-23 DIAGNOSIS — Z79899 Other long term (current) drug therapy: Secondary | ICD-10-CM | POA: Insufficient documentation

## 2018-09-23 DIAGNOSIS — B3781 Candidal esophagitis: Secondary | ICD-10-CM | POA: Diagnosis not present

## 2018-09-23 DIAGNOSIS — J690 Pneumonitis due to inhalation of food and vomit: Secondary | ICD-10-CM | POA: Diagnosis not present

## 2018-09-23 DIAGNOSIS — R1310 Dysphagia, unspecified: Secondary | ICD-10-CM | POA: Diagnosis not present

## 2018-09-23 DIAGNOSIS — R402421 Glasgow coma scale score 9-12, in the field [EMT or ambulance]: Secondary | ICD-10-CM | POA: Diagnosis not present

## 2018-09-23 DIAGNOSIS — E10649 Type 1 diabetes mellitus with hypoglycemia without coma: Secondary | ICD-10-CM | POA: Diagnosis not present

## 2018-09-23 DIAGNOSIS — K259 Gastric ulcer, unspecified as acute or chronic, without hemorrhage or perforation: Secondary | ICD-10-CM | POA: Diagnosis not present

## 2018-09-23 DIAGNOSIS — J189 Pneumonia, unspecified organism: Secondary | ICD-10-CM | POA: Diagnosis not present

## 2018-09-23 DIAGNOSIS — E162 Hypoglycemia, unspecified: Secondary | ICD-10-CM | POA: Diagnosis not present

## 2018-09-23 DIAGNOSIS — R404 Transient alteration of awareness: Secondary | ICD-10-CM | POA: Diagnosis not present

## 2018-09-23 DIAGNOSIS — E161 Other hypoglycemia: Secondary | ICD-10-CM | POA: Diagnosis not present

## 2018-09-23 DIAGNOSIS — E1042 Type 1 diabetes mellitus with diabetic polyneuropathy: Secondary | ICD-10-CM | POA: Diagnosis not present

## 2018-09-23 DIAGNOSIS — B182 Chronic viral hepatitis C: Secondary | ICD-10-CM | POA: Diagnosis not present

## 2018-09-23 LAB — CBC
HCT: 30.4 % — ABNORMAL LOW (ref 39.0–52.0)
Hemoglobin: 9.5 g/dL — ABNORMAL LOW (ref 13.0–17.0)
MCH: 27.9 pg (ref 26.0–34.0)
MCHC: 31.3 g/dL (ref 30.0–36.0)
MCV: 89.4 fL (ref 80.0–100.0)
Platelets: 477 10*3/uL — ABNORMAL HIGH (ref 150–400)
RBC: 3.4 MIL/uL — ABNORMAL LOW (ref 4.22–5.81)
RDW: 14.2 % (ref 11.5–15.5)
WBC: 11.9 10*3/uL — ABNORMAL HIGH (ref 4.0–10.5)
nRBC: 0 % (ref 0.0–0.2)

## 2018-09-23 LAB — COMPREHENSIVE METABOLIC PANEL
ALT: 25 U/L (ref 0–44)
AST: 30 U/L (ref 15–41)
Albumin: 3 g/dL — ABNORMAL LOW (ref 3.5–5.0)
Alkaline Phosphatase: 128 U/L — ABNORMAL HIGH (ref 38–126)
Anion gap: 12 (ref 5–15)
BUN: 21 mg/dL (ref 8–23)
CO2: 21 mmol/L — ABNORMAL LOW (ref 22–32)
Calcium: 9.1 mg/dL (ref 8.9–10.3)
Chloride: 101 mmol/L (ref 98–111)
Creatinine, Ser: 0.93 mg/dL (ref 0.61–1.24)
GFR calc Af Amer: >60 mL/min (ref >60)
GFR calc non Af Amer: >60 mL/min (ref >60)
Glucose, Bld: 43 mg/dL — CL (ref 70–99)
Potassium: 4 mmol/L (ref 3.5–5.1)
Sodium: 134 mmol/L — ABNORMAL LOW (ref 135–145)
Total Bilirubin: 0.2 mg/dL — ABNORMAL LOW (ref 0.3–1.2)
Total Protein: 7.3 g/dL (ref 6.5–8.1)

## 2018-09-23 LAB — CBG MONITORING, ED
Glucose-Capillary: 49 mg/dL — ABNORMAL LOW (ref 70–99)
Glucose-Capillary: 55 mg/dL — ABNORMAL LOW (ref 70–99)

## 2018-09-23 MED ORDER — DEXTROSE 10 % IV SOLN
100.0000 mL | Freq: Once | INTRAVENOUS | Status: AC
  Administered 2018-09-23: 100 mL via INTRAVENOUS
  Filled 2018-09-23: qty 500

## 2018-09-23 MED ORDER — SODIUM CHLORIDE 0.9% FLUSH: 3.0000 mL | INTRAVENOUS | Status: DC | PRN

## 2018-09-23 MED ORDER — SODIUM CHLORIDE 0.9% FLUSH
3.0000 mL | Freq: Two times a day (BID) | INTRAVENOUS | Status: DC
  Administered 2018-09-23: 3 mL via INTRAVENOUS

## 2018-09-23 MED ORDER — SODIUM CHLORIDE 0.9 % IV SOLN
250.0000 mL | INTRAVENOUS | Status: DC | PRN
  Administered 2018-09-23: 250 mL via INTRAVENOUS

## 2018-09-23 NOTE — ED Notes (Signed)
Apple juice 8 oz and graham crackers given to patient, alert and oriented. CBG 55 @2235 

## 2018-09-23 NOTE — ED Notes (Signed)
Bed: [facility]80 Expected date:  Expected time:  Means of arrival:  Comments: 63 yr old hypoglycemia

## 2018-09-23 NOTE — ED Notes (Signed)
Patient given a sandwich and encouraged to drink his juice. Patient is alert and oriented at this time.

## 2018-09-23 NOTE — Telephone Encounter (Signed)
Please advise if okay filling out form - if so we can print this for you. Thanks.

## 2018-09-23 NOTE — ED Notes (Signed)
Pt complaining of being extremely cold.  Pt given 3 Warm blankets

## 2018-09-23 NOTE — Telephone Encounter (Signed)
Pt needs to get set up with new PCP for this.

## 2018-09-23 NOTE — ED Triage Notes (Signed)
Pt BIB EMS from home. Upon arrival EMS checked CBG- pt CBG 34. Pt reports taking insulin and not eating any food. Pt reports he has pneumonia currently. EMS gave fluids D10.  20 G L FA  Temp 97.5 BP 170/78 Pulse 82 100% RA CBG 78

## 2018-09-23 NOTE — ED Provider Notes (Signed)
[facility] DEPT Provider Note   CSN: [id number] Arrival date & time: 09/23/18  2112    History   Chief Complaint Chief Complaint  Patient presents with  . Hypoglycemia    HPI [name] is a 63 y.o. male.     HPI Pt took his insulin at 11am today.  He has been having trouble today with his blood sugar being low today.  It was 34 and he ate some food and it improved.  EMS was called but they did not transport him to the hospital. This evening the patient remembers being at home feeling cold.  The next thing he remembers was the ambulance arriving.  No vomiting or diarrhea today.  No fevers.  Patient states his only complaint right now is that he feels cold.  Past Medical History:  Diagnosis Date  . DEPRESSION   . DIABETES MELLITUS, TYPE I   . DRUG ABUSE    pt should have NO controlled substances rx'ed  . Glaucoma   . HEPATITIS C    chronic  . Hypertension 02/18/2011  . 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 Vitiligo     Patient Active Problem List   Diagnosis Date Noted  . Acute respiratory failure ([place])   . Altered mental status   . Gastroesophageal reflux disease   . Hallucinations   . Hypoglycemia associated with diabetes ([place]) 08/12/2018  . At risk for adverse drug event 02/17/2018  . Abrasions of multiple sites   . Demand ischemia of myocardium ([place]) 02/05/2018  . Acute metabolic encephalopathy 82/50/0370  . DKA (diabetic ketoacidoses) ([place]) 02/03/2018  . Leukocytosis 02/03/2018  . Hypoglycemia 12/24/2017  . Syncope 12/24/2017  . Syncope and collapse 12/23/2017  . IV drug abuse ([place]) 04/19/2017  . Acute encephalopathy 04/19/2017  . Hemangioma 10/02/2016  . Foreign body (FB) in soft tissue   . 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[place])   . Gangrene ([place])   . Type I (juvenile type) diabetes mellitus without mention of complication, not stated as uncontrolled   . Tobacco abuse   . Essential hypertension   . Diabetic  infection of right foot ([place]) 07/22/2016  . Diabetic foot infection ([place]) 07/22/2016  . Chronic ulcer of heel, right, with fat layer exposed ([place]) 05/20/2016  . DKA, type 1 ([place]) 04/18/2016  . AKI (acute kidney injury) ([place]) 04/18/2016  . Hyponatremia 04/18/2016  . Severe protein-calorie malnutrition ([place]) 04/18/2016  . Elevated troponin 04/18/2016  . Poor dentition 07/12/2015  . Tobacco use 07/06/2013  . PAD (peripheral artery disease) ([place]) 04/29/2013  . COPD, mild ([place])   . 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 Diabetic neuropathy ([place]) 06/21/2010  . PROLIFERATIVE DIABETIC RETINOPATHY 06/21/2010  . SKIN TAG 01/30/2010  . Chronic hepatitis C ([place]) 11/22/2008  . VITILIGO 11/22/2008  . PROTEINURIA, MILD 11/22/2008  . Substance abuse ([place]) 04/23/2007  . DEPRESSION 11/11/2006    Past Surgical History:  Procedure Laterality Date  . ABDOMINAL AORTOGRAM W/LOWER EXTREMITY N/A 07/25/2016   Procedure: Abdominal Aortogram w/Bilateral Lower Extremity Runoff;  Surgeon: Conrad Buena, MD;  Location: [facility];  Service: Cardiovascular;  Laterality: N/A;  . ABDOMINAL AORTOGRAM W/LOWER EXTREMITY N/A 12/24/2016   Procedure: ABDOMINAL AORTOGRAM W/LOWER EXTREMITY;  Surgeon: Serafina Mitchell, MD;  Location: [facility];  Service: Cardiovascular;  Laterality: N/A;  . AMPUTATION TOE Right 07/26/2016   Procedure: AMPUTATION GREAT TOE;  Surgeon: Serafina Mitchell, MD;  Location: [facility];  Service: Vascular;  Laterality: Right;  . BIOPSY  08/25/2018   Procedure: BIOPSY;  Surgeon: Irving Copas., MD;  Location: [facility];  Service: Gastroenterology;;  . ESOPHAGOGASTRODUODENOSCOPY N/A 08/25/2018   Procedure: ESOPHAGOGASTRODUODENOSCOPY (EGD);  Surgeon: Irving Copas., MD;  Location: [facility];  Service: Gastroenterology;  Laterality: N/A;  . EYE SURGERY     retinal surgery x 2, right eye  . INSERTION OF ILIAC STENT Right 07/26/2016    Procedure: INSERTION OF RIGHT POPLITEAL STENT WITH BALLOON ANGIOPLASTY;  Surgeon: Serafina Mitchell, MD;  Location: [facility];  Service: Vascular;  Laterality: Right;  . LOWER EXTREMITY ANGIOGRAM Right 07/26/2016   Procedure: LOWER EXTREMITY ANGIOGRAM;  Surgeon: Serafina Mitchell, MD;  Location: [facility];  Service: Vascular;  Laterality: Right;  . PERIPHERAL VASCULAR BALLOON ANGIOPLASTY Right 07/26/2016   Procedure: PERIPHERAL VASCULAR BALLOON ANGIOPLASTY  RIGHT ANTERIOR TIBEAL ARTERY AND RIGHT SUPERFICIAL FEMORAL ARTERY;  Surgeon: Serafina Mitchell, MD;  Location: [facility];  Service: Vascular;  Laterality: Right;  . PERIPHERAL VASCULAR INTERVENTION Right 12/24/2016   Procedure: PERIPHERAL VASCULAR INTERVENTION;  Surgeon: Serafina Mitchell, MD;  Location: [facility];  Service: Cardiovascular;  Laterality: Right;  lower extr        Home Medications    Prior to Admission medications   Medication Sig Start Date End Date Taking? Authorizing Provider  insulin aspart protamine - aspart (NOVOLOG 70/30 MIX) (70-30) 100 UNIT/ML FlexPen Inject 15 units every morning and 10 units every evening. 08/15/18  Yes Mikhail, Velta Addison, DO  Accu-Chek Softclix Lancets lancets Use to monitor glucose levels BID; E10.29 08/19/18   Renato Shin, MD  amLODipine (NORVASC) 10 MG tablet Take 1 tablet (10 mg total) by mouth daily for 30 days. 08/15/18 09/14/18  Mikhail, Velta Addison, DO  Blood Glucose Calibration (ACCU-CHEK AVIVA) SOLN 1 each by In Vitro route as needed. Use to calibrate meter prn 08/19/18   Renato Shin, MD  blood glucose meter kit and supplies KIT Dispense based on patient and insurance preference. Use up to four times daily as directed. (FOR ICD-9 250.00, 250.01). 08/15/18   Cristal Ford, DO  Blood Glucose Monitoring Suppl (ACCU-CHEK AVIVA PLUS) w/Device KIT 1 each by Does not apply route 2 (two) times a day. Use to monitor glucose levels BID; E10.29 08/19/18   Renato Shin, MD  feeding supplement, ENSURE ENLIVE, (ENSURE  ENLIVE) LIQD Take 237 mLs by mouth 3 (three) times daily between meals. 09/10/18   Barton Dubois, MD  folic acid (FOLVITE) 1 MG tablet Take 1 tablet (1 mg total) by mouth daily. 09/11/18   Barton Dubois, MD  glucose blood (ACCU-CHEK AVIVA PLUS) test strip Use to monitor glucose levels BID; E10.29 08/19/18   Renato Shin, MD  Insulin Pen Needle 31G X 5 MM MISC Use with novolog flex pen 08/15/18   Cristal Ford, DO  Isopropyl Alcohol Wipes 70 % MISC Apply 1 each topically daily as needed. 02/27/18   Medina-Vargas, Monina C, NP  nicotine (NICODERM CQ - DOSED IN MG/24 HOURS) 21 mg/24hr patch Place 1 patch (21 mg total) onto the skin daily. 08/16/18   Mikhail, Velta Addison, DO  pantoprazole (PROTONIX) 40 MG tablet Take 1 tablet (40 mg total) by mouth 2 (two) times daily. 09/10/18   Barton Dubois, MD  pregabalin (LYRICA) 50 MG capsule Take 1 capsule (50 mg total) by mouth 2 (two) times daily. 09/10/18   Barton Dubois, MD  risperiDONE (RISPERDAL) 0.5 MG tablet Take 0.5 tablets (0.25 mg  total) by mouth 2 (two) times daily. 09/10/18   Barton Dubois, MD  sucralfate (CARAFATE) 1 GM/10ML suspension Take 10 mLs (1 g total) by mouth 4 (four) times daily -  with meals and at bedtime for 12 days. 09/10/18 09/22/18  Barton Dubois, MD    Family History Family History  Problem Relation Age of Onset  . Diabetes Father   . Kidney disease Father   . Arthritis Mother   . Heart disease Mother        CAD    Social History Social History   Tobacco Use  . Smoking status: Current Every Day Smoker    Packs/day: 1.00    Years: 51.00    Pack years: 51.00    Types: Cigarettes  . Smokeless tobacco: Former Systems developer    Types: Chew    Quit date: 1985  Substance Use Topics  . Alcohol use: No    Alcohol/week: 0.0 standard drinks    Comment: 08/14/14 none  . Drug use: Yes    Types: Marijuana, Heroin, Cocaine, "Crack" cocaine, Fentanyl, Methylphenidate    Comment: s/p rehab x 6, occasional drug use (per pt last  use was 06/2015)      Allergies   Codeine   Review of Systems Review of Systems  All other systems reviewed and are negative.    Physical Exam Updated Vital Signs BP (!) 166/70   Pulse 90   Temp (!) 97.5 F (36.4 C) (Oral)   Resp 18   SpO2 100%   Physical Exam Vitals signs and nursing note reviewed.  Constitutional:      General: He is not in acute distress.    Appearance: He is well-developed.     Comments: Frail, appears older than his age  HENT:     Head: Normocephalic and atraumatic.     Right Ear: External ear normal.     Left Ear: External ear normal.  Eyes:     General: No scleral icterus.       Right eye: No discharge.        Left eye: No discharge.     Conjunctiva/sclera: Conjunctivae normal.  Neck:     Musculoskeletal: Neck supple.     Trachea: No tracheal deviation.  Cardiovascular:     Rate and Rhythm: Normal rate and regular rhythm.  Pulmonary:     Effort: Pulmonary effort is normal. No respiratory distress.     Breath sounds: Normal breath sounds. No stridor. No wheezing or rales.  Abdominal:     General: Bowel sounds are normal. There is no distension.     Palpations: Abdomen is soft.     Tenderness: There is no abdominal tenderness. There is no guarding or rebound.  Musculoskeletal:        General: No tenderness.  Skin:    General: Skin is warm and dry.     Findings: No rash.  Neurological:     Mental Status: He is alert.     Cranial Nerves: No cranial nerve deficit (no facial droop, extraocular movements intact, no slurred speech).     Sensory: No sensory deficit.     Motor: No abnormal muscle tone or seizure activity.     Coordination: Coordination normal.      ED Treatments / Results  Labs (all labs ordered are listed, but only abnormal results are displayed) Labs Reviewed  CBG MONITORING, ED - Abnormal; Notable for the following components:      Result Value   Glucose-Capillary 55 (*)  All other components within normal limits  COMPREHENSIVE  METABOLIC PANEL  CBC  CBG MONITORING, ED  CBG MONITORING, ED    EKG None  Radiology No results found.  Procedures Procedures (including critical care time)  Medications Ordered in ED Medications  sodium chloride flush (NS) 0.9 % injection 3 mL (3 mLs Intravenous Given 09/23/18 2208)  sodium chloride flush (NS) 0.9 % injection 3 mL (has no administration in time range)  0.9 %  sodium chloride infusion (250 mLs Intravenous New Bag/Given 09/23/18 2207)  dextrose 10 % infusion (has no administration in time range)     Initial Impression / Assessment and Plan / ED Course  I have reviewed the triage vital signs and the nursing notes.  Pertinent labs & imaging results that were available during my care of the patient were reviewed by me and considered in my medical decision making (see chart for details).   Initial CBG in the 50s.   IV dextrose infusion ordered.  Labs pending.  Will plan on serial CBGs.  Monitor in the ED.  If pt is unable to maintain his blood sugar may need to be admitted.   [facility]  Final Clinical Impressions(s) / ED Diagnoses   Final diagnoses:  Hypoglycemia     Dorie Rank, MD 09/23/18 2259

## 2018-09-24 LAB — CBG MONITORING, ED
Glucose-Capillary: 108 mg/dL — ABNORMAL HIGH (ref 70–99)
Glucose-Capillary: 143 mg/dL — ABNORMAL HIGH (ref 70–99)
Glucose-Capillary: 179 mg/dL — ABNORMAL HIGH (ref 70–99)

## 2018-09-24 NOTE — Telephone Encounter (Signed)
Called [name], Education officer, museum, to inform of Dr. Cordelia Pen request to obtain completion of form through new PCP. Also informed, pt NS'd for 09/17/18 appt. Have not seen pt recently to inform about new PCP. Verbalized acceptance and understanding.

## 2018-09-24 NOTE — ED Provider Notes (Signed)
5:09 AM No hypoglycemia since after midnight.  Current sugar is 143.     Jarelle Ates, Jenny Reichmann, MD 09/24/18 [phone]

## 2018-09-27 DIAGNOSIS — I959 Hypotension, unspecified: Secondary | ICD-10-CM | POA: Diagnosis not present

## 2018-09-27 DIAGNOSIS — E162 Hypoglycemia, unspecified: Secondary | ICD-10-CM | POA: Diagnosis not present

## 2018-09-27 DIAGNOSIS — R4020 Unspecified coma: Secondary | ICD-10-CM | POA: Diagnosis not present

## 2018-09-27 DIAGNOSIS — E161 Other hypoglycemia: Secondary | ICD-10-CM | POA: Diagnosis not present

## 2018-09-27 DIAGNOSIS — R0902 Hypoxemia: Secondary | ICD-10-CM | POA: Diagnosis not present

## 2018-09-28 DIAGNOSIS — I10 Essential (primary) hypertension: Secondary | ICD-10-CM | POA: Diagnosis not present

## 2018-09-28 DIAGNOSIS — E1042 Type 1 diabetes mellitus with diabetic polyneuropathy: Secondary | ICD-10-CM | POA: Diagnosis not present

## 2018-09-28 DIAGNOSIS — R1310 Dysphagia, unspecified: Secondary | ICD-10-CM | POA: Diagnosis not present

## 2018-09-28 DIAGNOSIS — J690 Pneumonitis due to inhalation of food and vomit: Secondary | ICD-10-CM | POA: Diagnosis not present

## 2018-09-28 DIAGNOSIS — B182 Chronic viral hepatitis C: Secondary | ICD-10-CM | POA: Diagnosis not present

## 2018-09-28 DIAGNOSIS — B3781 Candidal esophagitis: Secondary | ICD-10-CM | POA: Diagnosis not present

## 2018-09-28 DIAGNOSIS — F17210 Nicotine dependence, cigarettes, uncomplicated: Secondary | ICD-10-CM | POA: Diagnosis not present

## 2018-09-28 DIAGNOSIS — K259 Gastric ulcer, unspecified as acute or chronic, without hemorrhage or perforation: Secondary | ICD-10-CM | POA: Diagnosis not present

## 2018-09-28 DIAGNOSIS — J9601 Acute respiratory failure with hypoxia: Secondary | ICD-10-CM | POA: Diagnosis not present

## 2018-09-29 DIAGNOSIS — R1310 Dysphagia, unspecified: Secondary | ICD-10-CM | POA: Diagnosis not present

## 2018-09-29 DIAGNOSIS — J690 Pneumonitis due to inhalation of food and vomit: Secondary | ICD-10-CM | POA: Diagnosis not present

## 2018-09-29 DIAGNOSIS — F17210 Nicotine dependence, cigarettes, uncomplicated: Secondary | ICD-10-CM | POA: Diagnosis not present

## 2018-09-29 DIAGNOSIS — E1042 Type 1 diabetes mellitus with diabetic polyneuropathy: Secondary | ICD-10-CM | POA: Diagnosis not present

## 2018-09-29 DIAGNOSIS — I10 Essential (primary) hypertension: Secondary | ICD-10-CM | POA: Diagnosis not present

## 2018-09-29 DIAGNOSIS — B3781 Candidal esophagitis: Secondary | ICD-10-CM | POA: Diagnosis not present

## 2018-09-29 DIAGNOSIS — B182 Chronic viral hepatitis C: Secondary | ICD-10-CM | POA: Diagnosis not present

## 2018-09-29 DIAGNOSIS — K259 Gastric ulcer, unspecified as acute or chronic, without hemorrhage or perforation: Secondary | ICD-10-CM | POA: Diagnosis not present

## 2018-09-29 DIAGNOSIS — J9601 Acute respiratory failure with hypoxia: Secondary | ICD-10-CM | POA: Diagnosis not present

## 2018-09-30 DIAGNOSIS — B3781 Candidal esophagitis: Secondary | ICD-10-CM | POA: Diagnosis not present

## 2018-09-30 DIAGNOSIS — B182 Chronic viral hepatitis C: Secondary | ICD-10-CM | POA: Diagnosis not present

## 2018-09-30 DIAGNOSIS — J690 Pneumonitis due to inhalation of food and vomit: Secondary | ICD-10-CM | POA: Diagnosis not present

## 2018-09-30 DIAGNOSIS — I10 Essential (primary) hypertension: Secondary | ICD-10-CM | POA: Diagnosis not present

## 2018-09-30 DIAGNOSIS — R1310 Dysphagia, unspecified: Secondary | ICD-10-CM | POA: Diagnosis not present

## 2018-09-30 DIAGNOSIS — F17210 Nicotine dependence, cigarettes, uncomplicated: Secondary | ICD-10-CM | POA: Diagnosis not present

## 2018-09-30 DIAGNOSIS — K259 Gastric ulcer, unspecified as acute or chronic, without hemorrhage or perforation: Secondary | ICD-10-CM | POA: Diagnosis not present

## 2018-09-30 DIAGNOSIS — J9601 Acute respiratory failure with hypoxia: Secondary | ICD-10-CM | POA: Diagnosis not present

## 2018-09-30 DIAGNOSIS — E1042 Type 1 diabetes mellitus with diabetic polyneuropathy: Secondary | ICD-10-CM | POA: Diagnosis not present

## 2018-10-03 ENCOUNTER — Inpatient Hospital Stay (HOSPITAL_COMMUNITY)
Admission: EM | Admit: 2018-10-03 | Discharge: 2018-10-11 | DRG: 917 | Disposition: A | Discharge status: Home Health | Payer: Medicare HMO | Attending: Internal Medicine | Admitting: Internal Medicine

## 2018-10-03 ENCOUNTER — Other Ambulatory Visit: Payer: Self-pay

## 2018-10-03 ENCOUNTER — Emergency Department (HOSPITAL_COMMUNITY): Payer: Medicare HMO

## 2018-10-03 ENCOUNTER — Encounter (HOSPITAL_COMMUNITY): Payer: Self-pay

## 2018-10-03 DIAGNOSIS — T50901A Poisoning by unspecified drugs, medicaments and biological substances, accidental (unintentional), initial encounter: Secondary | ICD-10-CM | POA: Diagnosis not present

## 2018-10-03 DIAGNOSIS — N179 Acute kidney failure, unspecified: Secondary | ICD-10-CM | POA: Diagnosis present

## 2018-10-03 DIAGNOSIS — E11649 Type 2 diabetes mellitus with hypoglycemia without coma: Secondary | ICD-10-CM | POA: Diagnosis not present

## 2018-10-03 DIAGNOSIS — Z885 Allergy status to narcotic agent status: Secondary | ICD-10-CM

## 2018-10-03 DIAGNOSIS — Y92009 Unspecified place in unspecified non-institutional (private) residence as the place of occurrence of the external cause: Secondary | ICD-10-CM

## 2018-10-03 DIAGNOSIS — B182 Chronic viral hepatitis C: Secondary | ICD-10-CM | POA: Diagnosis present

## 2018-10-03 DIAGNOSIS — E103599 Type 1 diabetes mellitus with proliferative diabetic retinopathy without macular edema, unspecified eye: Secondary | ICD-10-CM | POA: Diagnosis present

## 2018-10-03 DIAGNOSIS — G92 Toxic encephalopathy: Secondary | ICD-10-CM | POA: Diagnosis present

## 2018-10-03 DIAGNOSIS — Z79899 Other long term (current) drug therapy: Secondary | ICD-10-CM | POA: Diagnosis not present

## 2018-10-03 DIAGNOSIS — R0902 Hypoxemia: Secondary | ICD-10-CM | POA: Diagnosis present

## 2018-10-03 DIAGNOSIS — I739 Peripheral vascular disease, unspecified: Secondary | ICD-10-CM | POA: Diagnosis present

## 2018-10-03 DIAGNOSIS — J189 Pneumonia, unspecified organism: Secondary | ICD-10-CM

## 2018-10-03 DIAGNOSIS — E1165 Type 2 diabetes mellitus with hyperglycemia: Secondary | ICD-10-CM | POA: Diagnosis not present

## 2018-10-03 DIAGNOSIS — R55 Syncope and collapse: Secondary | ICD-10-CM | POA: Diagnosis present

## 2018-10-03 DIAGNOSIS — E871 Hypo-osmolality and hyponatremia: Secondary | ICD-10-CM | POA: Diagnosis not present

## 2018-10-03 DIAGNOSIS — F1190 Opioid use, unspecified, uncomplicated: Secondary | ICD-10-CM

## 2018-10-03 DIAGNOSIS — E161 Other hypoglycemia: Secondary | ICD-10-CM | POA: Diagnosis not present

## 2018-10-03 DIAGNOSIS — Z794 Long term (current) use of insulin: Secondary | ICD-10-CM

## 2018-10-03 DIAGNOSIS — Z20828 Contact with and (suspected) exposure to other viral communicable diseases: Secondary | ICD-10-CM | POA: Diagnosis not present

## 2018-10-03 DIAGNOSIS — T401X1A Poisoning by heroin, accidental (unintentional), initial encounter: Secondary | ICD-10-CM | POA: Diagnosis not present

## 2018-10-03 DIAGNOSIS — E1051 Type 1 diabetes mellitus with diabetic peripheral angiopathy without gangrene: Secondary | ICD-10-CM | POA: Diagnosis present

## 2018-10-03 DIAGNOSIS — J690 Pneumonitis due to inhalation of food and vomit: Secondary | ICD-10-CM | POA: Diagnosis present

## 2018-10-03 DIAGNOSIS — R404 Transient alteration of awareness: Secondary | ICD-10-CM | POA: Diagnosis not present

## 2018-10-03 DIAGNOSIS — F329 Major depressive disorder, single episode, unspecified: Secondary | ICD-10-CM | POA: Diagnosis present

## 2018-10-03 DIAGNOSIS — R339 Retention of urine, unspecified: Secondary | ICD-10-CM | POA: Diagnosis not present

## 2018-10-03 DIAGNOSIS — R2689 Other abnormalities of gait and mobility: Secondary | ICD-10-CM | POA: Diagnosis not present

## 2018-10-03 DIAGNOSIS — E1142 Type 2 diabetes mellitus with diabetic polyneuropathy: Secondary | ICD-10-CM | POA: Diagnosis not present

## 2018-10-03 DIAGNOSIS — Z8249 Family history of ischemic heart disease and other diseases of the circulatory system: Secondary | ICD-10-CM

## 2018-10-03 DIAGNOSIS — F419 Anxiety disorder, unspecified: Secondary | ICD-10-CM | POA: Diagnosis present

## 2018-10-03 DIAGNOSIS — F17210 Nicotine dependence, cigarettes, uncomplicated: Secondary | ICD-10-CM | POA: Diagnosis present

## 2018-10-03 DIAGNOSIS — T404X1A Poisoning by other synthetic narcotics, accidental (unintentional), initial encounter: Principal | ICD-10-CM | POA: Diagnosis present

## 2018-10-03 DIAGNOSIS — E1042 Type 1 diabetes mellitus with diabetic polyneuropathy: Secondary | ICD-10-CM | POA: Diagnosis present

## 2018-10-03 DIAGNOSIS — E10649 Type 1 diabetes mellitus with hypoglycemia without coma: Secondary | ICD-10-CM | POA: Diagnosis present

## 2018-10-03 DIAGNOSIS — Z833 Family history of diabetes mellitus: Secondary | ICD-10-CM

## 2018-10-03 DIAGNOSIS — G9341 Metabolic encephalopathy: Secondary | ICD-10-CM | POA: Diagnosis not present

## 2018-10-03 DIAGNOSIS — F1910 Other psychoactive substance abuse, uncomplicated: Secondary | ICD-10-CM | POA: Diagnosis present

## 2018-10-03 DIAGNOSIS — Z89411 Acquired absence of right great toe: Secondary | ICD-10-CM

## 2018-10-03 DIAGNOSIS — R4020 Unspecified coma: Secondary | ICD-10-CM | POA: Diagnosis not present

## 2018-10-03 DIAGNOSIS — R1310 Dysphagia, unspecified: Secondary | ICD-10-CM | POA: Diagnosis present

## 2018-10-03 DIAGNOSIS — E1140 Type 2 diabetes mellitus with diabetic neuropathy, unspecified: Secondary | ICD-10-CM | POA: Diagnosis present

## 2018-10-03 DIAGNOSIS — J984 Other disorders of lung: Secondary | ICD-10-CM | POA: Diagnosis not present

## 2018-10-03 DIAGNOSIS — D649 Anemia, unspecified: Secondary | ICD-10-CM | POA: Diagnosis present

## 2018-10-03 DIAGNOSIS — L0390 Cellulitis, unspecified: Secondary | ICD-10-CM | POA: Diagnosis not present

## 2018-10-03 DIAGNOSIS — H409 Unspecified glaucoma: Secondary | ICD-10-CM | POA: Diagnosis present

## 2018-10-03 DIAGNOSIS — J181 Lobar pneumonia, unspecified organism: Secondary | ICD-10-CM | POA: Diagnosis not present

## 2018-10-03 DIAGNOSIS — I10 Essential (primary) hypertension: Secondary | ICD-10-CM | POA: Diagnosis present

## 2018-10-03 DIAGNOSIS — E876 Hypokalemia: Secondary | ICD-10-CM | POA: Diagnosis present

## 2018-10-03 DIAGNOSIS — Z1159 Encounter for screening for other viral diseases: Secondary | ICD-10-CM | POA: Diagnosis not present

## 2018-10-03 DIAGNOSIS — E162 Hypoglycemia, unspecified: Secondary | ICD-10-CM

## 2018-10-03 DIAGNOSIS — K219 Gastro-esophageal reflux disease without esophagitis: Secondary | ICD-10-CM | POA: Diagnosis present

## 2018-10-03 HISTORY — DX: Poisoning by heroin, accidental (unintentional), initial encounter: T40.1X1A

## 2018-10-03 HISTORY — DX: Opioid use, unspecified, uncomplicated: F11.90

## 2018-10-03 LAB — URINALYSIS, ROUTINE W REFLEX MICROSCOPIC
Bacteria, UA: NONE SEEN
Bilirubin Urine: NEGATIVE
Glucose, UA: >=500 mg/dL — AB
Hgb urine dipstick: NEGATIVE
Ketones, ur: NEGATIVE mg/dL
Leukocytes,Ua: NEGATIVE
Nitrite: NEGATIVE
Protein, ur: 100 mg/dL — AB
Specific Gravity, Urine: 1.011 (ref 1.005–1.030)
pH: 7 (ref 5.0–8.0)

## 2018-10-03 LAB — RAPID URINE DRUG SCREEN, HOSP PERFORMED
Amphetamines: POSITIVE — AB
Barbiturates: NOT DETECTED
Benzodiazepines: NOT DETECTED
Cocaine: NOT DETECTED
Opiates: NOT DETECTED
Tetrahydrocannabinol: POSITIVE — AB

## 2018-10-03 LAB — SARS CORONAVIRUS 2 BY RT PCR (HOSPITAL ORDER, PERFORMED IN ~~LOC~~ HOSPITAL LAB): SARS Coronavirus 2: NEGATIVE

## 2018-10-03 LAB — COMPREHENSIVE METABOLIC PANEL
ALT: 31 U/L (ref 0–44)
AST: 30 U/L (ref 15–41)
Albumin: 2.9 g/dL — ABNORMAL LOW (ref 3.5–5.0)
Alkaline Phosphatase: 108 U/L (ref 38–126)
Anion gap: 8 (ref 5–15)
BUN: 30 mg/dL — ABNORMAL HIGH (ref 8–23)
CO2: 26 mmol/L (ref 22–32)
Calcium: 8.8 mg/dL — ABNORMAL LOW (ref 8.9–10.3)
Chloride: 99 mmol/L (ref 98–111)
Creatinine, Ser: 1.13 mg/dL (ref 0.61–1.24)
GFR calc Af Amer: >60 mL/min (ref >60)
GFR calc non Af Amer: >60 mL/min (ref >60)
Glucose, Bld: 82 mg/dL (ref 70–99)
Potassium: 4.5 mmol/L (ref 3.5–5.1)
Sodium: 133 mmol/L — ABNORMAL LOW (ref 135–145)
Total Bilirubin: 0.4 mg/dL (ref 0.3–1.2)
Total Protein: 6.8 g/dL (ref 6.5–8.1)

## 2018-10-03 LAB — CBC
HCT: 30.4 % — ABNORMAL LOW (ref 39.0–52.0)
Hemoglobin: 9.4 g/dL — ABNORMAL LOW (ref 13.0–17.0)
MCH: 27.8 pg (ref 26.0–34.0)
MCHC: 30.9 g/dL (ref 30.0–36.0)
MCV: 89.9 fL (ref 80.0–100.0)
Platelets: 326 10*3/uL (ref 150–400)
RBC: 3.38 MIL/uL — ABNORMAL LOW (ref 4.22–5.81)
RDW: 14.3 % (ref 11.5–15.5)
WBC: 6.8 10*3/uL (ref 4.0–10.5)
nRBC: 0 % (ref 0.0–0.2)

## 2018-10-03 LAB — CBG MONITORING, ED
Glucose-Capillary: 79 mg/dL (ref 70–99)
Glucose-Capillary: 106 mg/dL — ABNORMAL HIGH (ref 70–99)
Glucose-Capillary: 56 mg/dL — ABNORMAL LOW (ref 70–99)
Glucose-Capillary: 58 mg/dL — ABNORMAL LOW (ref 70–99)

## 2018-10-03 LAB — ETHANOL: Alcohol, Ethyl (B): <10 mg/dL (ref <10)

## 2018-10-03 LAB — LACTIC ACID, PLASMA: Lactic Acid, Venous: 1.1 mmol/L (ref 0.5–1.9)

## 2018-10-03 LAB — ACETAMINOPHEN LEVEL: Acetaminophen (Tylenol), Serum: <10 ug/mL — ABNORMAL LOW (ref 10–30)

## 2018-10-03 LAB — SALICYLATE LEVEL: Salicylate Lvl: <7 mg/dL (ref 2.8–30.0)

## 2018-10-03 MED ORDER — DEXTROSE-NACL 5-0.45 % IV SOLN
INTRAVENOUS | Status: DC
  Administered 2018-10-03: 23:00:00 via INTRAVENOUS

## 2018-10-03 MED ORDER — NALOXONE HCL 4 MG/0.1ML NA LIQD
1.0000 | Freq: Once | NASAL | Status: AC | PRN
  Administered 2018-10-03: 1 via NASAL
  Filled 2018-10-03: qty 4

## 2018-10-03 MED ORDER — SODIUM CHLORIDE 0.9 % IV SOLN
1.0000 g | Freq: Once | INTRAVENOUS | Status: AC
  Administered 2018-10-03: 1 g via INTRAVENOUS
  Filled 2018-10-03: qty 10

## 2018-10-03 NOTE — ED Notes (Signed)
Bed: WA14 Expected date:  Expected time:  Means of arrival:  Comments: 

## 2018-10-03 NOTE — ED Notes (Signed)
X-ray at bedside

## 2018-10-03 NOTE — ED Notes (Signed)
Pt attempted to give urine specimen but did not provide one. Pt stated, "I'll pee when I feel like it".

## 2018-10-03 NOTE — ED Notes (Signed)
Pt cussing at Probation officer while in room. Writer asked pt to stop cussing, but pt continued. Pt has call bell within reach and knows to call staff if assistance is needed.

## 2018-10-03 NOTE — ED Notes (Signed)
Pt found out of bed attempting to walk to the BR. Pt ambulated to BR with steady gait but on the way back to the room pt began acting like he was going to fall. Pt began rocking back and forth and yelling at this writer "push me, push me". Pt assisted back into bed and asked to not get out of bed again without assistance. Pt has call bell within reach and the bed in its lowest position.

## 2018-10-03 NOTE — ED Notes (Signed)
Pt undressed and belongings at bedside. Pt stated his wallet is in his front pocket of his jeans and does not want them stolen. Pt aware his belongings are beside him in a pt belongings bag.

## 2018-10-03 NOTE — ED Notes (Signed)
Patient given PO fluids and food.

## 2018-10-03 NOTE — H&P (Signed)
History and Physical   [name] [place] @ [place] Admission History and Physical [employer], D.O.    Patient Name: [name] MR#: [id number] Date of Birth: Nov 22, 1955 Date of Admission: 10/03/2018  Referring MD/NP/PA: Dr. Alvino Chapel Primary Care Physician: Patient, No Pcp Per  Chief Complaint:  Chief Complaint  Patient presents with  . Drug Overdose  . Hypoglycemia  . Fall    HPI: [name] is a 63 y.o. male with a known history of insulin-dependent diabetes, depression, hepatitis C, hypertension, polysubstance abuse, with overdose presents to the emergency department for evaluation of altered mental status.  Patient was in a usual state of health until he was found facedown laying in a driveway.  EMS arrival found his sugar to be 36.  He stated that he snorted heroin which was does not do very often.  He was given Narcan and dextrose by EMS. He complains only of pain which is neuropathic in nature and not relieved by his current Lyrica.    Patient denies fevers/chills, weakness, dizziness, chest pain, shortness of breath, N/V/C/D, abdominal pain, dysuria/frequency, changes in mental status.    Otherwise there has been no change in status. Patient has been taking medication as prescribed and there has been no recent change in medication or diet.  No recent antibiotics.  There has been no recent illness, hospitalizations, travel or sick contacts.    EMS/ED Course:  Medical admission has been requested for further management of possible heroin overdose, hypoglycemia, syncope  Review of Systems:  CONSTITUTIONAL: No fever/chills, fatigue, weakness, weight gain/loss, headache. EYES: No blurry or double vision. ENT: No tinnitus, postnasal drip, redness or soreness of the oropharynx. RESPIRATORY: No cough, dyspnea, wheeze.  No hemoptysis.  CARDIOVASCULAR: No chest pain, palpitations, syncope, orthopnea. No lower extremity edema.  GASTROINTESTINAL: No nausea,  vomiting, abdominal pain, diarrhea, constipation.  No hematemesis, melena or hematochezia. GENITOURINARY: No dysuria, frequency, hematuria. ENDOCRINE: No polyuria or nocturia. No heat or cold intolerance. HEMATOLOGY: No anemia, bruising, bleeding. INTEGUMENTARY: No rashes, ulcers, lesions. MUSCULOSKELETAL: Positive leg and back pain. No arthritis, gout, dyspnea. NEUROLOGIC: No numbness, tingling, ataxia, seizure-type activity, weakness. PSYCHIATRIC: No anxiety, depression, insomnia.   Past Medical History:  Diagnosis Date  . DEPRESSION   . DIABETES MELLITUS, TYPE I   . DRUG ABUSE    pt should have NO controlled substances rx'ed  . Glaucoma   . HEPATITIS C    chronic  . Heroin overdose ([place]) 10/03/2018  . Heroin use 10/03/2018  . Hypertension 02/18/2011  . 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 Vitiligo     Past Surgical History:  Procedure Laterality Date  . ABDOMINAL AORTOGRAM W/LOWER EXTREMITY N/A 07/25/2016   Procedure: Abdominal Aortogram w/Bilateral Lower Extremity Runoff;  Surgeon: Conrad Dupont, MD;  Location: [facility];  Service: Cardiovascular;  Laterality: N/A;  . ABDOMINAL AORTOGRAM W/LOWER EXTREMITY N/A 12/24/2016   Procedure: ABDOMINAL AORTOGRAM W/LOWER EXTREMITY;  Surgeon: Serafina Mitchell, MD;  Location: [facility];  Service: Cardiovascular;  Laterality: N/A;  . AMPUTATION TOE Right 07/26/2016   Procedure: AMPUTATION GREAT TOE;  Surgeon: Serafina Mitchell, MD;  Location: [facility];  Service: Vascular;  Laterality: Right;  . BIOPSY  08/25/2018   Procedure: BIOPSY;  Surgeon: Irving Copas., MD;  Location: [facility];  Service: Gastroenterology;;  . ESOPHAGOGASTRODUODENOSCOPY N/A 08/25/2018   Procedure: ESOPHAGOGASTRODUODENOSCOPY (EGD);  Surgeon: Irving Copas., MD;  Location: [facility];  Service: Gastroenterology;  Laterality: N/A;  . EYE  SURGERY     retinal surgery x 2, right eye  . INSERTION OF ILIAC STENT Right 07/26/2016    Procedure: INSERTION OF RIGHT POPLITEAL STENT WITH BALLOON ANGIOPLASTY;  Surgeon: Serafina Mitchell, MD;  Location: [facility];  Service: Vascular;  Laterality: Right;  . LOWER EXTREMITY ANGIOGRAM Right 07/26/2016   Procedure: LOWER EXTREMITY ANGIOGRAM;  Surgeon: Serafina Mitchell, MD;  Location: [facility];  Service: Vascular;  Laterality: Right;  . PERIPHERAL VASCULAR BALLOON ANGIOPLASTY Right 07/26/2016   Procedure: PERIPHERAL VASCULAR BALLOON ANGIOPLASTY  RIGHT ANTERIOR TIBEAL ARTERY AND RIGHT SUPERFICIAL FEMORAL ARTERY;  Surgeon: Serafina Mitchell, MD;  Location: [name];  Service: Vascular;  Laterality: Right;  . PERIPHERAL VASCULAR INTERVENTION Right 12/24/2016   Procedure: PERIPHERAL VASCULAR INTERVENTION;  Surgeon: Serafina Mitchell, MD;  Location: [facility];  Service: Cardiovascular;  Laterality: Right;  lower extr     reports that he has been smoking cigarettes. He has a 51.00 pack-year smoking history. He quit smokeless tobacco use about 35 years ago.  His smokeless tobacco use included chew. He reports current drug use. Drugs: Marijuana, Heroin, Cocaine, "Crack" cocaine, Fentanyl, and Methylphenidate. He reports that he does not drink alcohol.  Allergies  Allergen Reactions  . Codeine Itching    Tolerates hydrocodone/apap    Family History  Problem Relation Age of Onset  . Diabetes Father   . Kidney disease Father   . Arthritis Mother   . Heart disease Mother        CAD    Prior to Admission medications   Medication Sig Start Date End Date Taking? Authorizing Provider  Accu-Chek Softclix Lancets lancets Use to monitor glucose levels BID; E10.29 08/19/18   Renato Shin, MD  amLODipine (NORVASC) 10 MG tablet Take 1 tablet (10 mg total) by mouth daily for 30 days. 08/15/18 09/14/18  Mikhail, Velta Addison, DO  Blood Glucose Calibration (ACCU-CHEK AVIVA) SOLN 1 each by In Vitro route as needed. Use to calibrate meter prn 08/19/18   Renato Shin, MD  blood glucose meter kit and supplies KIT Dispense  based on patient and insurance preference. Use up to four times daily as directed. (FOR ICD-9 250.00, 250.01). 08/15/18   Cristal Ford, DO  Blood Glucose Monitoring Suppl (ACCU-CHEK AVIVA PLUS) w/Device KIT 1 each by Does not apply route 2 (two) times a day. Use to monitor glucose levels BID; E10.29 08/19/18   Renato Shin, MD  feeding supplement, ENSURE ENLIVE, (ENSURE ENLIVE) LIQD Take 237 mLs by mouth 3 (three) times daily between meals. 09/10/18   Barton Dubois, MD  folic acid (FOLVITE) 1 MG tablet Take 1 tablet (1 mg total) by mouth daily. 09/11/18   Barton Dubois, MD  glucose blood (ACCU-CHEK AVIVA PLUS) test strip Use to monitor glucose levels BID; E10.29 08/19/18   Renato Shin, MD  insulin aspart protamine - aspart (NOVOLOG 70/30 MIX) (70-30) 100 UNIT/ML FlexPen Inject 15 units every morning and 10 units every evening. 08/15/18   Mikhail, Velta Addison, DO  Insulin Pen Needle 31G X 5 MM MISC Use with novolog flex pen 08/15/18   Cristal Ford, DO  Isopropyl Alcohol Wipes 70 % MISC Apply 1 each topically daily as needed. 02/27/18   Medina-Vargas, Monina C, NP  nicotine (NICODERM CQ - DOSED IN MG/24 HOURS) 21 mg/24hr patch Place 1 patch (21 mg total) onto the skin daily. 08/16/18   Mikhail, Velta Addison, DO  pantoprazole (PROTONIX) 40 MG tablet Take 1 tablet (40 mg total) by mouth 2 (two) times daily.  09/10/18   Barton Dubois, MD  pregabalin (LYRICA) 50 MG capsule Take 1 capsule (50 mg total) by mouth 2 (two) times daily. 09/10/18   Barton Dubois, MD  risperiDONE (RISPERDAL) 0.5 MG tablet Take 0.5 tablets (0.25 mg total) by mouth 2 (two) times daily. 09/10/18   Barton Dubois, MD  sucralfate (CARAFATE) 1 GM/10ML suspension Take 10 mLs (1 g total) by mouth 4 (four) times daily -  with meals and at bedtime for 12 days. 09/10/18 09/22/18  Barton Dubois, MD    Physical Exam: Vitals:   10/03/18 2154 10/03/18 2158 10/03/18 2200 10/03/18 2242  BP: (!) 152/64  130/61 (!) 167/67  Pulse: 88 88 87 90  Resp: '16 16 15  18  '$ Temp:      TempSrc:      SpO2: 93% 93% (!) 88% 95%  Weight:      Height:        GENERAL: 63 y.o.-year-old male patient, disheveled, ying in the bed in no acute distress. HEENT: Head atraumatic, normocephalic. Pupils equal. Mucus membranes dry NECK: Supple. No JVD. CHEST: Normal breath sounds bilaterally. No wheezing, rales, rhonchi or crackles. No use of accessory muscles of respiration.  No reproducible chest Retz tenderness.  CARDIOVASCULAR: S1, S2 normal. No murmurs, rubs, or gallops. Cap refill <2 seconds. Pulses intact distally.  ABDOMEN: Soft, nondistended, nontender. No rebound, guarding, rigidity. Normoactive bowel sounds present in all four quadrants.  EXTREMITIES: No pedal edema, cyanosis, or clubbing. No calf tenderness or Homan's sign.  NEUROLOGIC: The patient is alert and oriented x 3. Cranial nerves II through XII are grossly intact with no focal sensorimotor deficit. PSYCHIATRIC:  Normal affect, mood, thought content. SKIN: Warm, dry, and intact without obvious rash, lesion, or ulcer.    Labs on Admission:  CBC: Recent Labs  Lab 10/03/18 1904  WBC 6.8  HGB 9.4*  HCT 30.4*  MCV 89.9  PLT 800   Basic Metabolic Panel: Recent Labs  Lab 10/03/18 1904  NA 133*  K 4.5  CL 99  CO2 26  GLUCOSE 82  BUN 30*  CREATININE 1.13  CALCIUM 8.8*   GFR: Estimated Creatinine Clearance: 69.5 mL/min (by C-G formula based on SCr of 1.13 mg/dL). Liver Function Tests: Recent Labs  Lab 10/03/18 1904  AST 30  ALT 31  ALKPHOS 108  BILITOT 0.4  PROT 6.8  ALBUMIN 2.9*   No results for input(s): LIPASE, AMYLASE in the last 168 hours. No results for input(s): AMMONIA in the last 168 hours. Coagulation Profile: No results for input(s): INR, PROTIME in the last 168 hours. Cardiac Enzymes: No results for input(s): CKTOTAL, CKMB, CKMBINDEX, TROPONINI in the last 168 hours. BNP (last 3 results) No results for input(s): PROBNP in the last 8760 hours. HbA1C: No results  for input(s): HGBA1C in the last 72 hours. CBG: Recent Labs  Lab 10/03/18 1913 10/03/18 2130 10/03/18 2237 10/03/18 2238  GLUCAP 79 106* 58* 56*   Lipid Profile: No results for input(s): CHOL, HDL, LDLCALC, TRIG, CHOLHDL, LDLDIRECT in the last 72 hours. Thyroid Function Tests: No results for input(s): TSH, T4TOTAL, FREET4, T3FREE, THYROIDAB in the last 72 hours. Anemia Panel: No results for input(s): VITAMINB12, FOLATE, FERRITIN, TIBC, IRON, RETICCTPCT in the last 72 hours. Urine analysis:    Component Value Date/Time   COLORURINE YELLOW 10/03/2018 2128   APPEARANCEUR CLEAR 10/03/2018 2128   LABSPEC 1.011 10/03/2018 2128   PHURINE 7.0 10/03/2018 2128   GLUCOSEU >=500 (A) 10/03/2018 2128   GLUCOSEU 500 04/28/2012  [place] 10/03/2018 2128   [place] 10/03/2018 2128   BILIRUBINUR neg 04/28/2012 [place] 10/03/2018 2128   PROTEINUR 100 (A) 10/03/2018 2128   UROBILINOGEN 4.0 (H) 11/16/2013 1646   NITRITE NEGATIVE 10/03/2018 2128   LEUKOCYTESUR NEGATIVE 10/03/2018 2128   Sepsis Labs: '@LABRCNTIP'$ (procalcitonin:4,lacticidven:4) ) Recent Results (from the past 240 hour(s))  SARS Coronavirus 2 (CEPHEID- Performed in [place] hospital lab), Hosp Order     Status: None   Collection Time: 10/03/18  7:50 PM   Specimen: Nasopharyngeal Swab  Result Value Ref Range Status   SARS Coronavirus 2 NEGATIVE NEGATIVE Final    Comment: (NOTE) If result is NEGATIVE SARS-CoV-2 target nucleic acids are NOT DETECTED. The SARS-CoV-2 RNA is generally detectable in upper and lower  respiratory specimens during the acute phase of infection. The lowest  concentration of SARS-CoV-2 viral copies this assay can detect is 250  copies / mL. A negative result does not preclude SARS-CoV-2 infection  and should not be used as the sole basis for treatment or other  patient management decisions.  A negative result may occur with  improper specimen collection /  handling, submission of specimen other  than nasopharyngeal swab, presence of viral mutation(s) within the  areas targeted by this assay, and inadequate number of viral copies  (<250 copies / mL). A negative result must be combined with clinical  observations, patient history, and epidemiological information. If result is POSITIVE SARS-CoV-2 target nucleic acids are DETECTED. The SARS-CoV-2 RNA is generally detectable in upper and lower  respiratory specimens dur ing the acute phase of infection.  Positive  results are indicative of active infection with SARS-CoV-2.  Clinical  correlation with patient history and other diagnostic information is  necessary to determine patient infection status.  Positive results do  not rule out bacterial infection or co-infection with other viruses. If result is PRESUMPTIVE POSTIVE SARS-CoV-2 nucleic acids MAY BE PRESENT.   A presumptive positive result was obtained on the submitted specimen  and confirmed on repeat testing.  While 2019 novel coronavirus  (SARS-CoV-2) nucleic acids may be present in the submitted sample  additional confirmatory testing may be necessary for epidemiological  and / or clinical management purposes  to differentiate between  SARS-CoV-2 and other Sarbecovirus currently known to infect humans.  If clinically indicated additional testing with an alternate test  methodology [phone]) is advised. The SARS-CoV-2 RNA is generally  detectable in upper and lower respiratory sp ecimens during the acute  phase of infection. The expected result is Negative. Fact Sheet for Patients:  StrictlyIdeas.no Fact Sheet for Healthcare Providers: BankingDealers.co.za This test is not yet approved or cleared by the [place] FDA and has been authorized for detection and/or diagnosis of SARS-CoV-2 by FDA under an Emergency Use Authorization (EUA).  This EUA will remain in effect (meaning this  test can be used) for the duration of the COVID-19 declaration under Section 564(b)(1) of the Act, 21 U.S.C. section 360bbb-3(b)(1), unless the authorization is terminated or revoked sooner. Performed at [facility], [place] [address], [place], [place] [address]      Radiological Exams on Admission: Dg Chest Portable 1 View  Result Date: 10/03/2018 CLINICAL DATA:  [place] EXAM: PORTABLE CHEST 1 VIEW COMPARISON:  09/19/2018, 02/09/2018, 08/23/2018, 08/12/2018 FINDINGS: Persistent airspace opacity at the left base. This is a waxing and waning finding. Normal heart size. No pleural effusion. Old left rib fractures. IMPRESSION: Patchy airspace disease at the left  lung base intermittently visualized on multiple priors, possible recurrent pneumonia or aspiration Electronically Signed   By: Donavan Foil M.D.   On: 10/03/2018 19:42    EKG: Normal sinus rhythm at 92 bpm with normal axis and nonspecific ST-T wave changes.   Assessment/Plan  This is a 63 y.o. male with a history of insulin-dependent diabetes, depression, hepatitis C, hypertension, polysubstance abuse now being admitted with:  #. Hypoglycemia -Admit inpatient -IV dextrose -Accu-Cheks every 2 hours -Hold insulin  #.  Syncope likely secondary to above, urine drug screen was negative for opiates -Neurochecks  #.  Anemia, chronic and stable -Trend CBC  #.  Possible left lower lobe pneumonia, questionable aspiration -Cover with IV Zosyn and Vanco - Aspiration precautions  #. History of hypertension - Continue Norvasc  #. History of GERD - Continue Protonix  Admission status: Inpatient IV Fluids: D5 half-normal saline Diet/Nutrition: N.p.o. Consults called: None DVT Px: Lovenox, SCDs and early ambulation. Code Status: Full Code  Disposition Plan: To home in 1-2 days  All the records are reviewed and case discussed with ED provider. Management plans discussed with the patient and/or family who  express understanding and agree with plan of care.  Damonica Chopra D.O. on 10/03/2018 at 10:49 PM CC: Primary care physician; Patient, No Pcp Per   10/03/2018, 10:49 PM

## 2018-10-03 NOTE — ED Notes (Signed)
Pt given food and drink.

## 2018-10-03 NOTE — ED Provider Notes (Signed)
[facility] DEPT Provider Note   CSN: [id number] Arrival date & time: 10/03/18  1846    History   Chief Complaint Chief Complaint  Patient presents with  . Drug Overdose  . Hypoglycemia  . Fall    HPI [name] is a 63 y.o. male.     HPI Patient presents after heroin overdose.  States he had snorted heroin.  Found lying down in the driveway.  States he does not use heroin very often.  Also was hypoglycemic with a sugar of 36.  History of same.  Denies chest pain.  States she has chills and feels very cold.  No cough.  No abdominal pain.  No nausea or vomiting.  Was given Narcan and dextrose by EMS. Past Medical History:  Diagnosis Date  . DEPRESSION   . DIABETES MELLITUS, TYPE I   . DRUG ABUSE    pt should have NO controlled substances rx'ed  . Glaucoma   . HEPATITIS C    chronic  . Heroin overdose ([place]) 10/03/2018  . Heroin use 10/03/2018  . Hypertension 02/18/2011  . 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 Vitiligo     Patient Active Problem List   Diagnosis Date Noted  . Acute respiratory failure ([place])   . Altered mental status   . Gastroesophageal reflux disease   . Hallucinations   . Hypoglycemia associated with diabetes ([place]) 08/12/2018  . At risk for adverse drug event 02/17/2018  . Abrasions of multiple sites   . Demand ischemia of myocardium ([place]) 02/05/2018  . Acute metabolic encephalopathy 25/42/7062  . DKA (diabetic ketoacidoses) ([place]) 02/03/2018  . Leukocytosis 02/03/2018  . Hypoglycemia 12/24/2017  . Syncope 12/24/2017  . Syncope and collapse 12/23/2017  . IV drug abuse ([place]) 04/19/2017  . Acute encephalopathy 04/19/2017  . Hemangioma 10/02/2016  . Foreign body (FB) in soft tissue   . 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[place])   . Gangrene ([place])   . Type I (juvenile type) diabetes mellitus without mention of complication, not stated as uncontrolled   . Tobacco abuse   . Essential hypertension   .  Diabetic infection of right foot ([place]) 07/22/2016  . Diabetic foot infection ([place]) 07/22/2016  . Chronic ulcer of heel, right, with fat layer exposed ([place]) 05/20/2016  . DKA, type 1 ([place]) 04/18/2016  . AKI (acute kidney injury) ([place]) 04/18/2016  . Hyponatremia 04/18/2016  . Severe protein-calorie malnutrition ([place]) 04/18/2016  . Elevated troponin 04/18/2016  . Poor dentition 07/12/2015  . Tobacco use 07/06/2013  . PAD (peripheral artery disease) ([place]) 04/29/2013  . COPD, mild ([place])   . 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 Diabetic neuropathy ([place]) 06/21/2010  . PROLIFERATIVE DIABETIC RETINOPATHY 06/21/2010  . SKIN TAG 01/30/2010  . Chronic hepatitis C ([place]) 11/22/2008  . VITILIGO 11/22/2008  . PROTEINURIA, MILD 11/22/2008  . Substance abuse ([place]) 04/23/2007  . DEPRESSION 11/11/2006    Past Surgical History:  Procedure Laterality Date  . ABDOMINAL AORTOGRAM W/LOWER EXTREMITY N/A 07/25/2016   Procedure: Abdominal Aortogram w/Bilateral Lower Extremity Runoff;  Surgeon: Conrad Eldorado, MD;  Location: [facility];  Service: Cardiovascular;  Laterality: N/A;  . ABDOMINAL AORTOGRAM W/LOWER EXTREMITY N/A 12/24/2016   Procedure: ABDOMINAL AORTOGRAM W/LOWER EXTREMITY;  Surgeon: Serafina Mitchell, MD;  Location: [facility];  Service: Cardiovascular;  Laterality: N/A;  . AMPUTATION TOE Right 07/26/2016   Procedure: AMPUTATION GREAT TOE;  Surgeon: Serafina Mitchell,  MD;  Location: [facility];  Service: Vascular;  Laterality: Right;  . BIOPSY  08/25/2018   Procedure: BIOPSY;  Surgeon: Irving Copas., MD;  Location: [facility];  Service: Gastroenterology;;  . ESOPHAGOGASTRODUODENOSCOPY N/A 08/25/2018   Procedure: ESOPHAGOGASTRODUODENOSCOPY (EGD);  Surgeon: Irving Copas., MD;  Location: [facility];  Service: Gastroenterology;  Laterality: N/A;  . EYE SURGERY     retinal surgery x 2, right eye  . INSERTION OF ILIAC STENT Right  07/26/2016   Procedure: INSERTION OF RIGHT POPLITEAL STENT WITH BALLOON ANGIOPLASTY;  Surgeon: Serafina Mitchell, MD;  Location: [facility];  Service: Vascular;  Laterality: Right;  . LOWER EXTREMITY ANGIOGRAM Right 07/26/2016   Procedure: LOWER EXTREMITY ANGIOGRAM;  Surgeon: Serafina Mitchell, MD;  Location: [facility];  Service: Vascular;  Laterality: Right;  . PERIPHERAL VASCULAR BALLOON ANGIOPLASTY Right 07/26/2016   Procedure: PERIPHERAL VASCULAR BALLOON ANGIOPLASTY  RIGHT ANTERIOR TIBEAL ARTERY AND RIGHT SUPERFICIAL FEMORAL ARTERY;  Surgeon: Serafina Mitchell, MD;  Location: [facility];  Service: Vascular;  Laterality: Right;  . PERIPHERAL VASCULAR INTERVENTION Right 12/24/2016   Procedure: PERIPHERAL VASCULAR INTERVENTION;  Surgeon: Serafina Mitchell, MD;  Location: [facility];  Service: Cardiovascular;  Laterality: Right;  lower extr        Home Medications    Prior to Admission medications   Medication Sig Start Date End Date Taking? Authorizing Provider  Accu-Chek Softclix Lancets lancets Use to monitor glucose levels BID; E10.29 08/19/18   Renato Shin, MD  amLODipine (NORVASC) 10 MG tablet Take 1 tablet (10 mg total) by mouth daily for 30 days. 08/15/18 09/14/18  Mikhail, Velta Addison, DO  Blood Glucose Calibration (ACCU-CHEK AVIVA) SOLN 1 each by In Vitro route as needed. Use to calibrate meter prn 08/19/18   Renato Shin, MD  blood glucose meter kit and supplies KIT Dispense based on patient and insurance preference. Use up to four times daily as directed. (FOR ICD-9 250.00, 250.01). 08/15/18   Cristal Ford, DO  Blood Glucose Monitoring Suppl (ACCU-CHEK AVIVA PLUS) w/Device KIT 1 each by Does not apply route 2 (two) times a day. Use to monitor glucose levels BID; E10.29 08/19/18   Renato Shin, MD  feeding supplement, ENSURE ENLIVE, (ENSURE ENLIVE) LIQD Take 237 mLs by mouth 3 (three) times daily between meals. 09/10/18   Barton Dubois, MD  folic acid (FOLVITE) 1 MG tablet Take 1 tablet (1 mg total) by  mouth daily. 09/11/18   Barton Dubois, MD  glucose blood (ACCU-CHEK AVIVA PLUS) test strip Use to monitor glucose levels BID; E10.29 08/19/18   Renato Shin, MD  insulin aspart protamine - aspart (NOVOLOG 70/30 MIX) (70-30) 100 UNIT/ML FlexPen Inject 15 units every morning and 10 units every evening. 08/15/18   Mikhail, Velta Addison, DO  Insulin Pen Needle 31G X 5 MM MISC Use with novolog flex pen 08/15/18   Cristal Ford, DO  Isopropyl Alcohol Wipes 70 % MISC Apply 1 each topically daily as needed. 02/27/18   Medina-Vargas, Monina C, NP  nicotine (NICODERM CQ - DOSED IN MG/24 HOURS) 21 mg/24hr patch Place 1 patch (21 mg total) onto the skin daily. 08/16/18   Mikhail, Velta Addison, DO  pantoprazole (PROTONIX) 40 MG tablet Take 1 tablet (40 mg total) by mouth 2 (two) times daily. 09/10/18   Barton Dubois, MD  pregabalin (LYRICA) 50 MG capsule Take 1 capsule (50 mg total) by mouth 2 (two) times daily. 09/10/18   Barton Dubois, MD  risperiDONE (RISPERDAL) 0.5 MG tablet Take 0.5 tablets (  0.25 mg total) by mouth 2 (two) times daily. 09/10/18   Madera, Carlos, MD  sucralfate (CARAFATE) 1 GM/10ML suspension Take 10 mLs (1 g total) by mouth 4 (four) times daily -  with meals and at bedtime for 12 days. 09/10/18 09/22/18  Madera, Carlos, MD    Family History Family History  Problem Relation Age of Onset  . Diabetes Father   . Kidney disease Father   . Arthritis Mother   . Heart disease Mother        CAD    Social History Social History   Tobacco Use  . Smoking status: Current Every Day Smoker    Packs/day: 1.00    Years: 51.00    Pack years: 51.00    Types: Cigarettes  . Smokeless tobacco: Former User    Types: Chew    Quit date: 1985  Substance Use Topics  . Alcohol use: No    Alcohol/week: 0.0 standard drinks    Comment: 08/14/14 none  . Drug use: Yes    Types: Marijuana, Heroin, Cocaine, "Crack" cocaine, Fentanyl, Methylphenidate    Comment: s/p rehab x 6, occasional drug use (per pt last  use was  06/2015)     Allergies   Codeine   Review of Systems Review of Systems  Constitutional: Negative for appetite change.  HENT: Negative for congestion.   Respiratory: Negative for shortness of breath.   Gastrointestinal: Negative for abdominal pain.  Genitourinary: Negative for flank pain.  Musculoskeletal:       Leg pain.  Skin: Negative for rash.  Neurological: Positive for syncope.  Psychiatric/Behavioral: Negative for confusion.     Physical Exam Updated Vital Signs BP (!) 167/67 (BP Location: Left Arm)   Pulse 90   Temp (!) 101.4 F (38.6 C) (Rectal)   Resp 18   Ht 6' (1.829 m)   Wt 72.5 kg   SpO2 95%   BMI 21.68 kg/m   Physical Exam Vitals signs and nursing note reviewed.  HENT:     Head: Atraumatic.  Eyes:     Pupils: Pupils are equal, round, and reactive to light.  Cardiovascular:     Rate and Rhythm: Regular rhythm.  Abdominal:     Tenderness: There is no abdominal tenderness.  Musculoskeletal:     Right lower leg: No edema.     Left lower leg: No edema.  Skin:    General: Skin is warm.     Capillary Refill: Capillary refill takes less than 2 seconds.  Neurological:     Mental Status: He is alert. Mental status is at baseline.      ED Treatments / Results  Labs (all labs ordered are listed, but only abnormal results are displayed) Labs Reviewed  COMPREHENSIVE METABOLIC PANEL - Abnormal; Notable for the following components:      Result Value   Sodium 133 (*)    BUN 30 (*)    Calcium 8.8 (*)    Albumin 2.9 (*)    All other components within normal limits  ACETAMINOPHEN LEVEL - Abnormal; Notable for the following components:   Acetaminophen (Tylenol), Serum <10 (*)    All other components within normal limits  CBC - Abnormal; Notable for the following components:   RBC 3.38 (*)    Hemoglobin 9.4 (*)    HCT 30.4 (*)    All other components within normal limits  RAPID URINE DRUG SCREEN, HOSP PERFORMED - Abnormal; Notable for the following  components:   Amphetamines POSITIVE (*)      Tetrahydrocannabinol POSITIVE (*)    All other components within normal limits  URINALYSIS, ROUTINE W REFLEX MICROSCOPIC - Abnormal; Notable for the following components:   Glucose, UA >=500 (*)    Protein, ur 100 (*)    All other components within normal limits  CBG MONITORING, ED - Abnormal; Notable for the following components:   Glucose-Capillary 106 (*)    All other components within normal limits  CBG MONITORING, ED - Abnormal; Notable for the following components:   Glucose-Capillary 58 (*)    All other components within normal limits  CBG MONITORING, ED - Abnormal; Notable for the following components:   Glucose-Capillary 56 (*)    All other components within normal limits  SARS CORONAVIRUS 2 (HOSPITAL ORDER, [facility])  CULTURE, BLOOD (ROUTINE X 2)  CULTURE, BLOOD (ROUTINE X 2)  ETHANOL  SALICYLATE LEVEL  LACTIC ACID, PLASMA  CBG MONITORING, ED    EKG EKG Interpretation  Date/Time:  Saturday October 03 2018 19:06:00 EDT Ventricular Rate:  92 PR Interval:    QRS Duration: 90 QT Interval:  352 QTC Calculation: 436 R Axis:   -16 Text Interpretation:  Sinus rhythm Borderline left axis deviation Confirmed by Davonna Belling [phone]) on 10/03/2018 8:07:29 PM   Radiology Dg Chest Portable 1 View  Result Date: 10/03/2018 CLINICAL DATA:  [place] EXAM: PORTABLE CHEST 1 VIEW COMPARISON:  09/19/2018, 02/09/2018, 08/23/2018, 08/12/2018 FINDINGS: Persistent airspace opacity at the left base. This is a waxing and waning finding. Normal heart size. No pleural effusion. Old left rib fractures. IMPRESSION: Patchy airspace disease at the left lung base intermittently visualized on multiple priors, possible recurrent pneumonia or aspiration Electronically Signed   By: Donavan Foil M.D.   On: 10/03/2018 19:42    Procedures Procedures (including critical care time)  Medications Ordered in ED Medications  dextrose  5 %-0.45 % sodium chloride infusion (has no administration in time range)  cefTRIAXone (ROCEPHIN) 1 g in sodium chloride 0.9 % 100 mL IVPB (0 g Intravenous Stopped 10/03/18 2201)  naloxone (NARCAN) nasal spray 4 mg/0.1 mL (1 spray Nasal Provided for home use 10/03/18 2237)     Initial Impression / Assessment and Plan / ED Course  I have reviewed the triage vital signs and the nursing notes.  Pertinent labs & imaging results that were available during my care of the patient were reviewed by me and considered in my medical decision making (see chart for details).        Patient brought in for overdose.  Overdosed on likely heroin although opiates not appear on drug screen.  However was hypoglycemic.  Has been eating but had return of hypoglycemia.  Will require IV dextrose.  X-ray showed pneumonia.  Febrile.  COVID negative.  Will admit to hospitalist.  CRITICAL CARE Performed by: Davonna Belling Total critical care time: 30 minutes Critical care time was exclusive of separately billable procedures and treating other patients. Critical care was necessary to treat or prevent imminent or life-threatening deterioration. Critical care was time spent personally by me on the following activities: development of treatment plan with patient and/or surrogate as well as nursing, discussions with consultants, evaluation of patient's response to treatment, examination of patient, obtaining history from patient or surrogate, ordering and performing treatments and interventions, ordering and review of laboratory studies, ordering and review of radiographic studies, pulse oximetry and re-evaluation of patient's condition.  Final Clinical Impressions(s) / ED Diagnoses   Final diagnoses:  Accidental drug overdose, initial encounter  Pneumonia due to infectious organism, unspecified laterality, unspecified part of lung  Hypoglycemia    ED Discharge Orders    None       Davonna Belling, MD 10/03/18  2245

## 2018-10-03 NOTE — ED Triage Notes (Signed)
Patient arrived via [facility]. Patient is AOx4 and ambulatory. Neighbors called 911 due to patient laying face down in driveway. EMS arrived and patient had overdosed on Heroin while also having hypoglycemic event with CBG of 36. Patient was given [facility] and Intranasal Narcan followed by Oral glucose by [employer].  Family who was on scene reports that patient had heroin and drug paraphernalia in bathroom with 3 small children in house under ages of 40.   Patient has no complaints.

## 2018-10-03 NOTE — ED Notes (Signed)
Pt is aware a urine specimen is needed but told writer "it'll be a while before I pee".

## 2018-10-04 DIAGNOSIS — J189 Pneumonia, unspecified organism: Secondary | ICD-10-CM

## 2018-10-04 LAB — GLUCOSE, CAPILLARY
Glucose-Capillary: 97 mg/dL (ref 70–99)
Glucose-Capillary: 127 mg/dL — ABNORMAL HIGH (ref 70–99)
Glucose-Capillary: 230 mg/dL — ABNORMAL HIGH (ref 70–99)
Glucose-Capillary: 286 mg/dL — ABNORMAL HIGH (ref 70–99)
Glucose-Capillary: 369 mg/dL — ABNORMAL HIGH (ref 70–99)
Glucose-Capillary: 404 mg/dL — ABNORMAL HIGH (ref 70–99)
Glucose-Capillary: 216 mg/dL — ABNORMAL HIGH (ref 70–99)

## 2018-10-04 LAB — CBC
HCT: 26.2 % — ABNORMAL LOW (ref 39.0–52.0)
Hemoglobin: 8.4 g/dL — ABNORMAL LOW (ref 13.0–17.0)
MCH: 28 pg (ref 26.0–34.0)
MCHC: 32.1 g/dL (ref 30.0–36.0)
MCV: 87.3 fL (ref 80.0–100.0)
Platelets: 271 10*3/uL (ref 150–400)
RBC: 3 MIL/uL — ABNORMAL LOW (ref 4.22–5.81)
RDW: 14.3 % (ref 11.5–15.5)
WBC: 6.1 10*3/uL (ref 4.0–10.5)
nRBC: 0 % (ref 0.0–0.2)

## 2018-10-04 LAB — MAGNESIUM: Magnesium: 1.9 mg/dL (ref 1.7–2.4)

## 2018-10-04 LAB — BASIC METABOLIC PANEL
Anion gap: 9 (ref 5–15)
BUN: 25 mg/dL — ABNORMAL HIGH (ref 8–23)
CO2: 24 mmol/L (ref 22–32)
Calcium: 8.2 mg/dL — ABNORMAL LOW (ref 8.9–10.3)
Chloride: 100 mmol/L (ref 98–111)
Creatinine, Ser: 1.17 mg/dL (ref 0.61–1.24)
GFR calc Af Amer: >60 mL/min (ref >60)
GFR calc non Af Amer: >60 mL/min (ref >60)
Glucose, Bld: 179 mg/dL — ABNORMAL HIGH (ref 70–99)
Potassium: 4.3 mmol/L (ref 3.5–5.1)
Sodium: 133 mmol/L — ABNORMAL LOW (ref 135–145)

## 2018-10-04 LAB — PHOSPHORUS: Phosphorus: 3 mg/dL (ref 2.5–4.6)

## 2018-10-04 LAB — CBG MONITORING, ED: Glucose-Capillary: 85 mg/dL (ref 70–99)

## 2018-10-04 MED ORDER — SODIUM CHLORIDE 0.9 % IV SOLN
INTRAVENOUS | Status: DC | PRN
  Administered 2018-10-04: 250 mL via INTRAVENOUS

## 2018-10-04 MED ORDER — FOLIC ACID 1 MG PO TABS
1.0000 mg | ORAL_TABLET | Freq: Every day | ORAL | Status: DC
  Administered 2018-10-04 – 2018-10-11 (×8): 1 mg via ORAL
  Filled 2018-10-04 (×9): qty 1

## 2018-10-04 MED ORDER — HYDRALAZINE HCL 20 MG/ML IJ SOLN
10.0000 mg | Freq: Four times a day (QID) | INTRAMUSCULAR | Status: DC | PRN
  Administered 2018-10-05 – 2018-10-06 (×2): 10 mg via INTRAVENOUS
  Filled 2018-10-04 (×2): qty 1

## 2018-10-04 MED ORDER — LORAZEPAM 2 MG/ML IJ SOLN
0.5000 mg | INTRAMUSCULAR | Status: DC | PRN
  Administered 2018-10-04 – 2018-10-05 (×2): 0.5 mg via INTRAVENOUS
  Filled 2018-10-04 (×2): qty 1

## 2018-10-04 MED ORDER — DEXTROSE-NACL 5-0.45 % IV SOLN
INTRAVENOUS | Status: DC
  Administered 2018-10-04: 02:00:00 via INTRAVENOUS

## 2018-10-04 MED ORDER — SUCRALFATE 1 GM/10ML PO SUSP
1.0000 g | Freq: Three times a day (TID) | ORAL | Status: DC
  Administered 2018-10-04 – 2018-10-11 (×24): 1 g via ORAL
  Filled 2018-10-04 (×28): qty 10

## 2018-10-04 MED ORDER — MAGNESIUM CITRATE PO SOLN: 1.0000 | Freq: Once | ORAL | Status: DC | PRN

## 2018-10-04 MED ORDER — ACETAMINOPHEN 325 MG PO TABS
650.0000 mg | ORAL_TABLET | Freq: Four times a day (QID) | ORAL | Status: DC | PRN
  Administered 2018-10-04 (×3): 650 mg via ORAL
  Filled 2018-10-04 (×3): qty 2

## 2018-10-04 MED ORDER — SODIUM CHLORIDE 0.9 % IV SOLN: 1.0000 g | INTRAVENOUS | Status: DC

## 2018-10-04 MED ORDER — BISACODYL 5 MG PO TBEC: 5.0000 mg | DELAYED_RELEASE_TABLET | Freq: Every day | ORAL | Status: DC | PRN

## 2018-10-04 MED ORDER — PREGABALIN 50 MG PO CAPS
50.0000 mg | ORAL_CAPSULE | Freq: Two times a day (BID) | ORAL | Status: DC
  Administered 2018-10-04 – 2018-10-11 (×16): 50 mg via ORAL
  Filled 2018-10-04 (×18): qty 1

## 2018-10-04 MED ORDER — ACETAMINOPHEN 650 MG RE SUPP: 650.0000 mg | Freq: Four times a day (QID) | RECTAL | Status: DC | PRN

## 2018-10-04 MED ORDER — PIPERACILLIN-TAZOBACTAM 3.375 G IVPB
3.3750 g | Freq: Three times a day (TID) | INTRAVENOUS | Status: DC
  Administered 2018-10-04 – 2018-10-07 (×10): 3.375 g via INTRAVENOUS
  Filled 2018-10-04 (×9): qty 50

## 2018-10-04 MED ORDER — SODIUM CHLORIDE 0.9 % IV BOLUS
500.0000 mL | Freq: Once | INTRAVENOUS | Status: AC
  Administered 2018-10-04: 500 mL via INTRAVENOUS

## 2018-10-04 MED ORDER — INSULIN ASPART 100 UNIT/ML ~~LOC~~ SOLN
0.0000 [IU] | Freq: Three times a day (TID) | SUBCUTANEOUS | Status: DC
  Administered 2018-10-05: 5 [IU] via SUBCUTANEOUS
  Administered 2018-10-05: 1 [IU] via SUBCUTANEOUS
  Administered 2018-10-05 – 2018-10-06 (×2): 2 [IU] via SUBCUTANEOUS
  Administered 2018-10-06: 7 [IU] via SUBCUTANEOUS
  Administered 2018-10-06: 5 [IU] via SUBCUTANEOUS
  Administered 2018-10-07: 7 [IU] via SUBCUTANEOUS
  Administered 2018-10-07: 9 [IU] via SUBCUTANEOUS
  Administered 2018-10-07: 13:00:00 7 [IU] via SUBCUTANEOUS
  Administered 2018-10-08: 5 [IU] via SUBCUTANEOUS
  Administered 2018-10-08: 2 [IU] via SUBCUTANEOUS
  Administered 2018-10-09: 1 [IU] via SUBCUTANEOUS
  Administered 2018-10-09: 2 [IU] via SUBCUTANEOUS
  Administered 2018-10-09: 1 [IU] via SUBCUTANEOUS
  Administered 2018-10-10: 5 [IU] via SUBCUTANEOUS
  Administered 2018-10-11: 1 [IU] via SUBCUTANEOUS
  Administered 2018-10-11: 2 [IU] via SUBCUTANEOUS

## 2018-10-04 MED ORDER — ENOXAPARIN SODIUM 40 MG/0.4ML ~~LOC~~ SOLN
40.0000 mg | SUBCUTANEOUS | Status: DC
  Administered 2018-10-05: 40 mg via SUBCUTANEOUS
  Filled 2018-10-04 (×3): qty 0.4

## 2018-10-04 MED ORDER — VANCOMYCIN HCL 10 G IV SOLR
1250.0000 mg | INTRAVENOUS | Status: DC
  Administered 2018-10-05 – 2018-10-06 (×2): 1250 mg via INTRAVENOUS
  Filled 2018-10-04 (×2): qty 1250

## 2018-10-04 MED ORDER — SENNOSIDES-DOCUSATE SODIUM 8.6-50 MG PO TABS: 1.0000 | ORAL_TABLET | Freq: Every evening | ORAL | Status: DC | PRN

## 2018-10-04 MED ORDER — SODIUM CHLORIDE 0.9 % IV SOLN: 500.0000 mg | INTRAVENOUS | Status: DC

## 2018-10-04 MED ORDER — PANTOPRAZOLE SODIUM 40 MG IV SOLR
40.0000 mg | INTRAVENOUS | Status: DC
  Administered 2018-10-04 – 2018-10-07 (×4): 40 mg via INTRAVENOUS
  Filled 2018-10-04 (×4): qty 40

## 2018-10-04 MED ORDER — SODIUM CHLORIDE 0.9 % IV SOLN
INTRAVENOUS | Status: DC
  Administered 2018-10-04 – 2018-10-08 (×7): via INTRAVENOUS

## 2018-10-04 MED ORDER — ONDANSETRON HCL 4 MG/2ML IJ SOLN: 4.0000 mg | Freq: Four times a day (QID) | INTRAMUSCULAR | Status: DC | PRN

## 2018-10-04 MED ORDER — IPRATROPIUM-ALBUTEROL 0.5-2.5 (3) MG/3ML IN SOLN: 3.0000 mL | Freq: Four times a day (QID) | RESPIRATORY_TRACT | Status: DC | PRN

## 2018-10-04 MED ORDER — INSULIN ASPART 100 UNIT/ML ~~LOC~~ SOLN
9.0000 [IU] | Freq: Once | SUBCUTANEOUS | Status: AC
  Administered 2018-10-04: 9 [IU] via SUBCUTANEOUS

## 2018-10-04 MED ORDER — NICOTINE 21 MG/24HR TD PT24
21.0000 mg | MEDICATED_PATCH | Freq: Every day | TRANSDERMAL | Status: DC
  Administered 2018-10-05 – 2018-10-10 (×6): 21 mg via TRANSDERMAL
  Filled 2018-10-04 (×9): qty 1

## 2018-10-04 MED ORDER — LORAZEPAM 2 MG/ML IJ SOLN
0.5000 mg | Freq: Four times a day (QID) | INTRAMUSCULAR | Status: DC | PRN
  Administered 2018-10-04 (×2): 0.5 mg via INTRAVENOUS
  Filled 2018-10-04 (×2): qty 1

## 2018-10-04 MED ORDER — RISPERIDONE 0.25 MG PO TABS
0.2500 mg | ORAL_TABLET | Freq: Two times a day (BID) | ORAL | Status: DC
  Administered 2018-10-04 – 2018-10-11 (×15): 0.25 mg via ORAL
  Filled 2018-10-04 (×17): qty 1

## 2018-10-04 MED ORDER — PANTOPRAZOLE SODIUM 40 MG PO TBEC
40.0000 mg | DELAYED_RELEASE_TABLET | Freq: Two times a day (BID) | ORAL | Status: DC
  Administered 2018-10-04: 40 mg via ORAL
  Filled 2018-10-04 (×2): qty 1

## 2018-10-04 MED ORDER — AMLODIPINE BESYLATE 10 MG PO TABS
10.0000 mg | ORAL_TABLET | Freq: Every day | ORAL | Status: DC
  Administered 2018-10-04 – 2018-10-11 (×8): 10 mg via ORAL
  Filled 2018-10-04 (×9): qty 1

## 2018-10-04 MED ORDER — IPRATROPIUM-ALBUTEROL 0.5-2.5 (3) MG/3ML IN SOLN
3.0000 mL | Freq: Four times a day (QID) | RESPIRATORY_TRACT | Status: DC
  Filled 2018-10-04: qty 3

## 2018-10-04 MED ORDER — ONDANSETRON HCL 4 MG PO TABS: 4.0000 mg | ORAL_TABLET | Freq: Four times a day (QID) | ORAL | Status: DC | PRN

## 2018-10-04 MED ORDER — VANCOMYCIN HCL 10 G IV SOLR
1250.0000 mg | Freq: Once | INTRAVENOUS | Status: AC
  Administered 2018-10-04: 1250 mg via INTRAVENOUS
  Filled 2018-10-04: qty 1250

## 2018-10-04 MED ORDER — SODIUM CHLORIDE 0.9 % IV SOLN
500.0000 mg | Freq: Every day | INTRAVENOUS | Status: DC
  Filled 2018-10-04: qty 500

## 2018-10-04 MED ORDER — INSULIN GLARGINE 100 UNIT/ML ~~LOC~~ SOLN
10.0000 [IU] | Freq: Every day | SUBCUTANEOUS | Status: DC
  Administered 2018-10-04: 10 [IU] via SUBCUTANEOUS
  Filled 2018-10-04: qty 0.1

## 2018-10-04 MED ORDER — HYDROCODONE-ACETAMINOPHEN 5-325 MG PO TABS
1.0000 | ORAL_TABLET | Freq: Four times a day (QID) | ORAL | Status: DC | PRN
  Administered 2018-10-04: 2 via ORAL
  Administered 2018-10-06 (×2): 1 via ORAL
  Administered 2018-10-06 – 2018-10-07 (×3): 2 via ORAL
  Administered 2018-10-07: 05:00:00 1 via ORAL
  Administered 2018-10-08 (×2): 2 via ORAL
  Filled 2018-10-04: qty 2
  Filled 2018-10-04: qty 1
  Filled 2018-10-04 (×2): qty 2
  Filled 2018-10-04 (×2): qty 1
  Filled 2018-10-04: qty 2
  Filled 2018-10-04: qty 1
  Filled 2018-10-04 (×2): qty 2

## 2018-10-04 NOTE — Progress Notes (Signed)
Entered room to give pt. HHN tx. Patient states that he does not take them at home and does not want them here.

## 2018-10-04 NOTE — Progress Notes (Signed)
PT Cancellation Note  Patient Details Name: [name] MRN: [id number] DOB: Jun 18, 1955   Cancelled Treatment:    Reason Eval/Treat Not Completed: Patient declined, no reason specified   Weston Anna, PT Acute Rehabilitation Services Pager: [phone] Office: [phone]

## 2018-10-04 NOTE — Progress Notes (Signed)
Pt agitated, uncooperative, restless, non-compliant and irritable. Pt aggressive verbally and telling staff "call security I don't care". Cursing at staff. Pt refused to take his am medications. Refused to allow nurse to perform assessment. Charge nurse and [facility] called.  Dr Tyrell Antonio notified.

## 2018-10-04 NOTE — ED Notes (Signed)
ED TO INPATIENT HANDOFF REPORT  ED Nurse Name and Phone #: [place] G, [phone]  S Name/Age/Gender [name] 63 y.o. male Room/Bed: WA14/WA14  Code Status   Code Status: Full Code  Home/SNF/Other Home Patient oriented to: self, place, time and situation Is this baseline? Yes   Triage Complete: Triage complete  Chief Complaint Overdose  Triage Note Patient arrived via [facility]. Patient is AOx4 and ambulatory. Neighbors called 911 due to patient laying face down in driveway. EMS arrived and patient had overdosed on Heroin while also having hypoglycemic event with CBG of 36. Patient was given [facility] and Intranasal Narcan followed by Oral glucose by [employer].  Family who was on scene reports that patient had heroin and drug paraphernalia in bathroom with 3 small children in house under ages of 50.   Patient has no complaints.   Allergies Allergies  Allergen Reactions  . Codeine Itching    Tolerates hydrocodone/apap    Level of Care/Admitting Diagnosis ED Disposition    ED Disposition Condition [facility] Area: [place] [[id number]]  Level of Care: Med-Surg [16]  Covid Evaluation: Confirmed COVID Negative  Diagnosis: Hypoglycemia [528413]  Admitting Physician: Harvie Bridge [[id number]]  Attending Physician: Sherron Monday  Estimated length of stay: past midnight tomorrow  Certification:: I certify this patient will need inpatient services for at least 2 midnights  PT Class (Do Not Modify): Inpatient [101]  PT Acc Code (Do Not Modify): Private [1]       B Medical/Surgery History Past Medical History:  Diagnosis Date  . DEPRESSION   . DIABETES MELLITUS, TYPE I   . DRUG ABUSE    pt should have NO controlled substances rx'ed  . Glaucoma   . HEPATITIS C    chronic  . Heroin overdose ([place]) 10/03/2018  . Heroin use 10/03/2018  . Hypertension 02/18/2011  . 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  Vitiligo    Past Surgical History:  Procedure Laterality Date  . ABDOMINAL AORTOGRAM W/LOWER EXTREMITY N/A 07/25/2016   Procedure: Abdominal Aortogram w/Bilateral Lower Extremity Runoff;  Surgeon: Conrad Big Pool, MD;  Location: [place] [facility];  Service: Cardiovascular;  Laterality: N/A;  . ABDOMINAL AORTOGRAM W/LOWER EXTREMITY N/A 12/24/2016   Procedure: ABDOMINAL AORTOGRAM W/LOWER EXTREMITY;  Surgeon: Serafina Mitchell, MD;  Location: [place] [facility];  Service: Cardiovascular;  Laterality: N/A;  . AMPUTATION TOE Right 07/26/2016   Procedure: AMPUTATION GREAT TOE;  Surgeon: Serafina Mitchell, MD;  Location: [place];  Service: Vascular;  Laterality: Right;  . BIOPSY  08/25/2018   Procedure: BIOPSY;  Surgeon: Irving Copas., MD;  Location: [place];  Service: Gastroenterology;;  . ESOPHAGOGASTRODUODENOSCOPY N/A 08/25/2018   Procedure: ESOPHAGOGASTRODUODENOSCOPY (EGD);  Surgeon: Irving Copas., MD;  Location: [place];  Service: Gastroenterology;  Laterality: N/A;  . EYE SURGERY     retinal surgery x 2, right eye  . INSERTION OF ILIAC STENT Right 07/26/2016   Procedure: INSERTION OF RIGHT POPLITEAL STENT WITH BALLOON ANGIOPLASTY;  Surgeon: Serafina Mitchell, MD;  Location: [place];  Service: Vascular;  Laterality: Right;  . LOWER EXTREMITY ANGIOGRAM Right 07/26/2016   Procedure: LOWER EXTREMITY ANGIOGRAM;  Surgeon: Serafina Mitchell, MD;  Location: [place];  Service: Vascular;  Laterality: Right;  . PERIPHERAL VASCULAR BALLOON ANGIOPLASTY Right 07/26/2016   Procedure: PERIPHERAL VASCULAR BALLOON ANGIOPLASTY  RIGHT ANTERIOR TIBEAL ARTERY AND RIGHT SUPERFICIAL FEMORAL ARTERY;  Surgeon: Serafina Mitchell, MD;  Location[facility];  Service: Vascular;  Laterality: Right;  . PERIPHERAL VASCULAR INTERVENTION Right 12/24/2016   Procedure: PERIPHERAL VASCULAR INTERVENTION;  Surgeon: Serafina Mitchell, MD;  Location: [place] [facility];  Service: Cardiovascular;  Laterality: Right;  lower extr      A IV Location/Drains/Wounds Patient Lines/Drains/Airways Status   Active Line/Drains/Airways    Name:   Placement date:   Placement time:   Site:   Days:   Peripheral IV 10/04/18 Right Forearm   10/04/18    0107    Forearm   less than 1          Intake/Output Last 24 hours  Intake/Output Summary (Last 24 hours) at 10/04/2018 0108 Last data filed at 10/03/2018 2201 Gross per 24 hour  Intake 100 ml  Output -  Net 100 ml    Labs/Imaging Results for orders placed or performed during the hospital encounter of 10/03/18 (from the past 48 hour(s))  Comprehensive metabolic panel     Status: Abnormal   Collection Time: 10/03/18  7:04 PM  Result Value Ref Range   Sodium 133 (L) 135 - 145 mmol/L   Potassium 4.5 3.5 - 5.1 mmol/L   Chloride 99 98 - 111 mmol/L   CO2 26 22 - 32 mmol/L   Glucose, Bld 82 70 - 99 mg/dL   BUN 30 (H) 8 - 23 mg/dL   Creatinine, Ser 1.13 0.61 - 1.24 mg/dL   Calcium 8.8 (L) 8.9 - 10.3 mg/dL   Total Protein 6.8 6.5 - 8.1 g/dL   Albumin 2.9 (L) 3.5 - 5.0 g/dL   AST 30 15 - 41 U/L   ALT 31 0 - 44 U/L   Alkaline Phosphatase 108 38 - 126 U/L   Total Bilirubin 0.4 0.3 - 1.2 mg/dL   GFR calc non Af Amer >60 >60 mL/min   GFR calc Af Amer >60 >60 mL/min   Anion gap 8 5 - 15    Comment: Performed at [facility], [place] [address] [place], [place] [address]  Ethanol     Status: None   Collection Time: 10/03/18  7:04 PM  Result Value Ref Range   Alcohol, Ethyl (B) <10 <10 mg/dL    Comment: (NOTE) Lowest detectable limit for serum alcohol is 10 mg/dL. For medical purposes only. Performed at [facility], [place] [address], [place], [place] [address]   Salicylate level     Status: None   Collection Time: 10/03/18  7:04 PM  Result Value Ref Range   Salicylate Lvl <5.0 2.8 - 30.0 mg/dL    Comment: Performed at [facility], [place] [address] [place], [place] [address]  Acetaminophen level     Status: Abnormal    Collection Time: 10/03/18  7:04 PM  Result Value Ref Range   Acetaminophen (Tylenol), Serum <10 (L) 10 - 30 ug/mL    Comment: (NOTE) Therapeutic concentrations vary significantly. A range of 10-30 ug/mL  may be an effective concentration for many patients. However, some  are best treated at concentrations outside of this range. Acetaminophen concentrations >150 ug/mL at 4 hours after ingestion  and >50 ug/mL at 12 hours after ingestion are often associated with  toxic reactions. Performed at [facility], [place] [address], [place], [place] [address]   cbc     Status: Abnormal   Collection Time: 10/03/18  7:04 PM  Result Value Ref Range   WBC 6.8 4.0 - 10.5 K/uL   RBC  3.38 (L) 4.22 - 5.81 MIL/uL   Hemoglobin 9.4 (L) 13.0 - 17.0 g/dL   HCT 30.4 (L) 39.0 - 52.0 %   MCV 89.9 80.0 - 100.0 fL   MCH 27.8 26.0 - 34.0 pg   MCHC 30.9 30.0 - 36.0 g/dL   RDW 14.3 11.5 - 15.5 %   Platelets 326 150 - 400 K/uL   nRBC 0.0 0.0 - 0.2 %    Comment: Performed at [facility], [place] [address], [place], [place] [address]  CBG monitoring, ED     Status: None   Collection Time: 10/03/18  7:13 PM  Result Value Ref Range   Glucose-Capillary 79 70 - 99 mg/dL  Lactic acid, plasma     Status: None   Collection Time: 10/03/18  7:50 PM  Result Value Ref Range   Lactic Acid, Venous 1.1 0.5 - 1.9 mmol/L    Comment: Performed at [facility], [place] [address], [place], [place] [address]  SARS Coronavirus 2 (CEPHEID- Performed in [place] hospital lab), Hosp Order     Status: None   Collection Time: 10/03/18  7:50 PM   Specimen: Nasopharyngeal Swab  Result Value Ref Range   SARS Coronavirus 2 NEGATIVE NEGATIVE    Comment: (NOTE) If result is NEGATIVE SARS-CoV-2 target nucleic acids are NOT DETECTED. The SARS-CoV-2 RNA is generally detectable in upper and lower  respiratory specimens during the acute phase of infection. The lowest  concentration of  SARS-CoV-2 viral copies this assay can detect is 250  copies / mL. A negative result does not preclude SARS-CoV-2 infection  and should not be used as the sole basis for treatment or other  patient management decisions.  A negative result may occur with  improper specimen collection / handling, submission of specimen other  than nasopharyngeal swab, presence of viral mutation(s) within the  areas targeted by this assay, and inadequate number of viral copies  (<250 copies / mL). A negative result must be combined with clinical  observations, patient history, and epidemiological information. If result is POSITIVE SARS-CoV-2 target nucleic acids are DETECTED. The SARS-CoV-2 RNA is generally detectable in upper and lower  respiratory specimens dur ing the acute phase of infection.  Positive  results are indicative of active infection with SARS-CoV-2.  Clinical  correlation with patient history and other diagnostic information is  necessary to determine patient infection status.  Positive results do  not rule out bacterial infection or co-infection with other viruses. If result is PRESUMPTIVE POSTIVE SARS-CoV-2 nucleic acids MAY BE PRESENT.   A presumptive positive result was obtained on the submitted specimen  and confirmed on repeat testing.  While 2019 novel coronavirus  (SARS-CoV-2) nucleic acids may be present in the submitted sample  additional confirmatory testing may be necessary for epidemiological  and / or clinical management purposes  to differentiate between  SARS-CoV-2 and other Sarbecovirus currently known to infect humans.  If clinically indicated additional testing with an alternate test  methodology [phone]) is advised. The SARS-CoV-2 RNA is generally  detectable in upper and lower respiratory sp ecimens during the acute  phase of infection. The expected result is Negative. Fact Sheet for Patients:  StrictlyIdeas.no Fact Sheet for Healthcare  Providers: BankingDealers.co.za This test is not yet approved or cleared by the [place] FDA and has been authorized for detection and/or diagnosis of SARS-CoV-2 by FDA under an Emergency Use Authorization (EUA).  This EUA will remain in effect (meaning this test can be  used) for the duration of the COVID-19 declaration under Section 564(b)(1) of the Act, 21 U.S.C. section 360bbb-3(b)(1), unless the authorization is terminated or revoked sooner. Performed at [facility], [place] [address] [place], [place] [address]   Rapid urine drug screen (hospital performed)     Status: Abnormal   Collection Time: 10/03/18  9:28 PM  Result Value Ref Range   Opiates NONE DETECTED NONE DETECTED   Cocaine NONE DETECTED NONE DETECTED   Benzodiazepines NONE DETECTED NONE DETECTED   Amphetamines POSITIVE (A) NONE DETECTED   Tetrahydrocannabinol POSITIVE (A) NONE DETECTED   Barbiturates NONE DETECTED NONE DETECTED    Comment: (NOTE) DRUG SCREEN FOR MEDICAL PURPOSES ONLY.  IF CONFIRMATION IS NEEDED FOR ANY PURPOSE, NOTIFY LAB WITHIN 5 DAYS. LOWEST DETECTABLE LIMITS FOR URINE DRUG SCREEN Drug Class                     Cutoff (ng/mL) Amphetamine and metabolites    1000 Barbiturate and metabolites    200 Benzodiazepine                 329 Tricyclics and metabolites     300 Opiates and metabolites        300 Cocaine and metabolites        300 THC                            50 Performed at [facility], [place] [address], [place], [place] [address]   Urinalysis, Routine w reflex microscopic     Status: Abnormal   Collection Time: 10/03/18  9:28 PM  Result Value Ref Range   Color, Urine YELLOW YELLOW   APPearance CLEAR CLEAR   Specific Gravity, Urine 1.011 1.005 - 1.030   pH 7.0 5.0 - 8.0   Glucose, UA >=500 (A) NEGATIVE mg/dL   Hgb urine dipstick NEGATIVE NEGATIVE   Bilirubin Urine NEGATIVE NEGATIVE   Ketones, ur NEGATIVE NEGATIVE  mg/dL   Protein, ur 100 (A) NEGATIVE mg/dL   Nitrite NEGATIVE NEGATIVE   Leukocytes,Ua NEGATIVE NEGATIVE   RBC / HPF 0-5 0 - 5 RBC/hpf   WBC, UA 0-5 0 - 5 WBC/hpf   Bacteria, UA NONE SEEN NONE SEEN    Comment: Performed at [facility], [place] [address] [place], [place] [address]  CBG monitoring, ED     Status: Abnormal   Collection Time: 10/03/18  9:30 PM  Result Value Ref Range   Glucose-Capillary 106 (H) 70 - 99 mg/dL  CBG monitoring, ED     Status: Abnormal   Collection Time: 10/03/18 10:37 PM  Result Value Ref Range   Glucose-Capillary 58 (L) 70 - 99 mg/dL  CBG monitoring, ED     Status: Abnormal   Collection Time: 10/03/18 10:38 PM  Result Value Ref Range   Glucose-Capillary 56 (L) 70 - 99 mg/dL   Dg Chest Portable 1 View  Result Date: 10/03/2018 CLINICAL DATA:  [place] EXAM: PORTABLE CHEST 1 VIEW COMPARISON:  09/19/2018, 02/09/2018, 08/23/2018, 08/12/2018 FINDINGS: Persistent airspace opacity at the left base. This is a waxing and waning finding. Normal heart size. No pleural effusion. Old left rib fractures. IMPRESSION: Patchy airspace disease at the left lung base intermittently visualized on multiple priors, possible recurrent pneumonia or aspiration Electronically Signed   By: Donavan Foil M.D.   On: 10/03/2018 19:42    Pending Labs Unresulted Labs (From admission, onward)  Start     Ordered   10/10/18 0500  Creatinine, serum  (enoxaparin (LOVENOX)    CrCl >/= 30 ml/min)  Weekly,   R    Comments: while on enoxaparin therapy    10/04/18 0036   10/04/18 8756  Basic metabolic panel  Tomorrow morning,   R     10/04/18 0036   10/04/18 0500  CBC  Tomorrow morning,   R     10/04/18 0036   10/04/18 0036  Creatinine, serum  (enoxaparin (LOVENOX)    CrCl >/= 30 ml/min)  Once,   STAT    Comments: Baseline for enoxaparin therapy IF NOT ALREADY DRAWN.    10/04/18 0036   10/04/18 0036  Magnesium  Add-on,   AD     10/04/18 0036   10/04/18 0036  Phosphorus   Add-on,   AD     10/04/18 0036   10/04/18 0036  Culture, sputum-assessment  Once,   R    Question:  Patient immune status  Answer:  Immunocompromised   10/04/18 0036   10/03/18 1950  Culture, blood (routine x 2)  BLOOD CULTURE X 2,   STAT     10/03/18 1949          Vitals/Pain Today's Vitals   10/03/18 2241 10/03/18 2242 10/03/18 2330 10/04/18 0030  BP: (!) 167/67 (!) 167/67 (!) 127/55 (!) 124/48  Pulse: 93 90 85 88  Resp:  18 15 18   Temp:      TempSrc:      SpO2: 96% 95% 96% 97%  Weight:      Height:      PainSc:        Isolation Precautions No active isolations  Medications Medications  dextrose 5 %-0.45 % sodium chloride infusion ( Intravenous New Bag/Given 10/03/18 2250)  amLODipine (NORVASC) tablet 10 mg (has no administration in time range)  nicotine (NICODERM CQ - dosed in mg/24 hours) patch 21 mg (has no administration in time range)  risperiDONE (RISPERDAL) tablet 0.25 mg (has no administration in time range)  pantoprazole (PROTONIX) EC tablet 40 mg (has no administration in time range)  sucralfate (CARAFATE) 1 GM/10ML suspension 1 g (has no administration in time range)  folic acid (FOLVITE) tablet 1 mg (has no administration in time range)  pregabalin (LYRICA) capsule 50 mg (has no administration in time range)  enoxaparin (LOVENOX) injection 40 mg (has no administration in time range)  acetaminophen (TYLENOL) tablet 650 mg (has no administration in time range)    Or  acetaminophen (TYLENOL) suppository 650 mg (has no administration in time range)  senna-docusate (Senokot-S) tablet 1 tablet (has no administration in time range)  bisacodyl (DULCOLAX) EC tablet 5 mg (has no administration in time range)  magnesium citrate solution 1 Bottle (has no administration in time range)  ondansetron (ZOFRAN) tablet 4 mg (has no administration in time range)    Or  ondansetron (ZOFRAN) injection 4 mg (has no administration in time range)  dextrose 5 %-0.45 % sodium  chloride infusion (has no administration in time range)  ipratropium-albuterol (DUONEB) 0.5-2.5 (3) MG/3ML nebulizer solution 3 mL (has no administration in time range)  cefTRIAXone (ROCEPHIN) 1 g in sodium chloride 0.9 % 100 mL IVPB (has no administration in time range)  azithromycin (ZITHROMAX) 500 mg in sodium chloride 0.9 % 250 mL IVPB (has no administration in time range)  cefTRIAXone (ROCEPHIN) 1 g in sodium chloride 0.9 % 100 mL IVPB (0 g Intravenous Stopped 10/03/18 2201)  naloxone (NARCAN)  nasal spray 4 mg/0.1 mL (1 spray Nasal Provided for home use 10/03/18 2237)    Mobility walks High fall risk

## 2018-10-04 NOTE — Progress Notes (Signed)
Received this patient, [name] to room 1440 who is AA&Ox4 at this time. This patient is admitted with hypoglycemic D/T opiates overdose. This patient presented with an ill mannered, offensively impolite attitude and is uncooperative. Unable to obtained H&P at this time. Will continue to monitor and assess this  patient needs and concerns.

## 2018-10-04 NOTE — ED Notes (Signed)
Pt found getting out of bed again, pt had ripped all monitors off and removed his IV. Blood found all over the room, along with fluids from his drink, his sandwich and his food. Pt cussing at [employer] that he was going to stay in the ED and not be taken to the floor. Pt threatening writer that he was going to "do whatever he needed to do, and this Probation officer wasn't going to stop him".  Pt assisted with the urinal at bedside but purposely voided in the floor, laughed and told this writer "Haha, now you have to clean it up". Pt assisted back into the bed and transported to the fourth floor.

## 2018-10-04 NOTE — Progress Notes (Signed)
Pharmacy Antibiotic Note  [name] is a 63 y.o. male admitted on 10/03/2018 with pneumonia.  Pharmacy has been consulted for zosyn and vancomycin dosing.  Plan: Zosyn 3.375g IV q8h (4 hour infusion).  Vancomycin 1250 mg IV Q 24 hrs. Goal AUC 400-550. Expected AUC: 524.4  38.8/11.6 SCr used: 1.13. TBW 61.8 Daily SCr while on both vanc and zosyn Check MRSA PCR F/u renal fxn, WBC, temp, culture data Vancomycin levels as needed  Height: 6' (182.9 cm) Weight: 136 lb 3.9 oz (61.8 kg) IBW/kg (Calculated) : 77.6  Temp (24hrs), Avg:101.3 F (38.5 C), Min:100.1 F (37.8 C), Max:102.3 F (39.1 C)  Recent Labs  Lab 10/03/18 1904 10/03/18 1950  WBC 6.8  --   CREATININE 1.13  --   LATICACIDVEN  --  1.1    Estimated Creatinine Clearance: 59.2 mL/min (by C-G formula based on SCr of 1.13 mg/dL).    Allergies  Allergen Reactions  . Codeine Itching    Tolerates hydrocodone/apap    Antimicrobials this admission: 6/20 CTX x 1 6/21 Vanc>>  6/21 zosyn >> Dose adjustments this admission:  Microbiology results: 6/20 BCx2>>sent 6/20 COVID 19 neg 6/21 MRSA PCR: ordered  Thank you for allowing pharmacy to be a part of this patient's care.   Eudelia Bunch, Pharm.D 10/04/2018 1:50 AM

## 2018-10-04 NOTE — ED Notes (Signed)
Report given to [place], RN. 

## 2018-10-04 NOTE — ED Notes (Signed)
Pt found getting out of bed again without assistance attempting to walk to the BR. Pt has steady gait with x1 assist walking to the BR and back until pt is back in his room. Pt begins yelling "I'm going to fall, I'm going to fall". Pt is waving his arms around and rocking back and forth on his feet. Pt asked to sit down in bed and said "I can't. I'm going to fall". [place], RN present with pt ambulating and going back into room. Pt assisted back into bed, placed back on the monitor, given call bell and knows how to use it, has urinal at bedside with the bed in its lowest position. Writer again asked pt to not get up without assistance.

## 2018-10-04 NOTE — Progress Notes (Addendum)
PROGRESS NOTE    [name]  MHD:[id number] DOB: 29-May-1955 DOA: 10/03/2018 PCP: [name], No Pcp Per    Brief Narrative; 63 year old with past medical history significant for diabetes insulin-dependent, depression, hepatitis C, hypertension, polysubstance abuse with overdose who presents to the emergency department for evaluation of altered mental status.  [name] was found facedown laying in the driveway.  EMS arrival blood sugar was 36.  He is stated that he is not her heroine which he does not do very often.  See if Narcan and dextrose by EMS.  Evaluation in the ED; [name] was found to be febrile, hypoxic, low blood pressure.  Chest x-ray with possible pneumonia.  UDS positive for amphetamine and marijuana, UA negative.  Source coronavirus 2-.  BG 56   Assessment & Plan:   Active Problems:   Hypoglycemia  1 -acute metabolic encephalopathy: Related to hypoglycemia, drug use. [name] agitated screaming at staff.  Threatening to leave AMA.  [name] with fever and acute illness I do not think that he is able to leave AMA. Continue with Ativan as needed.  Continue with treatment of pneumonia  2-Hypoglycemia: Blood sugar on admission at 24.  [name] does use insulin at home. He received D5 IV fluids.  His blood sugar has been increasing more than 200.   Aspiration pneumonia: Chest x-ray with left lower lobe infiltrate.  [name] is febrile. Continue with IV antibiotics  Diabetes insulin-dependent: Resume a sliding scale insulin.  Syncope: Likely related to hypoglycemia and drug use.  Resolved  History of hypertension: Continue with Norvasc.  PRN hydralazine.  History of GERD continue with PPI.  Anemia: Check anemia panel in the morning. With hemoglobin.  Hyponatremia: Continue with normal saline.   Estimated body mass index is 18.48 kg/m as calculated from the following:   Height as of this encounter: 6' (1.829 m).   Weight as of this encounter: 61.8 kg.   DVT  prophylaxis: Lovenox Code Status: Full code Family Communication:  Son didn't answer phone Disposition Plan: Remain in the hospital for treatment of infection encephalopathy.  Consultants:   None   Procedures:  None   Antimicrobials:  Vancomycin and Zosyn   Subjective: [name] has been agitated, screaming at the staff.  He wants to be able to go to the pattern NP, he wants to be left alone.  Objective: Vitals:   10/04/18 0030 10/04/18 0129 10/04/18 0300 10/04/18 0649  BP: (!) 124/48 (!) 135/58  (!) 154/72  Pulse: 88 87  78  Resp: 18 14  16   Temp:  (!) 102.3 F (39.1 C) 99.8 F (37.7 C) 97.9 F (36.6 C)  TempSrc:  Oral Oral Oral  SpO2: 97% 97%  97%  Weight:  61.8 kg    Height:  6' (1.829 m)      Intake/Output Summary (Last 24 hours) at 10/04/2018 0730 Last data filed at 10/04/2018 2119 Gross per 24 hour  Intake 811.16 ml  Output -  Net 811.16 ml   Filed Weights   10/03/18 1855 10/03/18 1859 10/04/18 0129  Weight: 72.6 kg 72.5 kg 61.8 kg    Examination:  General exam: Appears calm and comfortable  Respiratory system: Lateral rhonchus Cardiovascular system: S1 & S2 heard, RRR. No JVD, murmurs, rubs, gallops or clicks. No pedal edema. Gastrointestinal system: Abdomen is nondistended, soft and nontender. No organomegaly or masses felt. Normal bowel sounds heard. Central nervous system: Alert and oriented. No focal neurological deficits. Extremities: Symmetric 5 x 5 power. Skin: No rashes, lesions or  ulcers Psychiatry: Agitated    Data Reviewed: I have personally reviewed following labs and imaging studies  CBC: Recent Labs  Lab 10/03/18 1904 10/04/18 0410  WBC 6.8 6.1  HGB 9.4* 8.4*  HCT 30.4* 26.2*  MCV 89.9 87.3  PLT 326 008   Basic Metabolic Panel: Recent Labs  Lab 10/03/18 1904 10/04/18 0410  NA 133* 133*  K 4.5 4.3  CL 99 100  CO2 26 24  GLUCOSE 82 179*  BUN 30* 25*  CREATININE 1.13 1.17  CALCIUM 8.8* 8.2*  MG  --  1.9  PHOS  --   3.0   GFR: Estimated Creatinine Clearance: 57.2 mL/min (by C-G formula based on SCr of 1.17 mg/dL). Liver Function Tests: Recent Labs  Lab 10/03/18 1904  AST 30  ALT 31  ALKPHOS 108  BILITOT 0.4  PROT 6.8  ALBUMIN 2.9*   No results for input(s): LIPASE, AMYLASE in the last 168 hours. No results for input(s): AMMONIA in the last 168 hours. Coagulation Profile: No results for input(s): INR, PROTIME in the last 168 hours. Cardiac Enzymes: No results for input(s): CKTOTAL, CKMB, CKMBINDEX, TROPONINI in the last 168 hours. BNP (last 3 results) No results for input(s): PROBNP in the last 8760 hours. HbA1C: No results for input(s): HGBA1C in the last 72 hours. CBG: Recent Labs  Lab 10/03/18 2238 10/04/18 0108 10/04/18 0137 10/04/18 0258 10/04/18 0646  GLUCAP 56* 85 97 127* 230*   Lipid Profile: No results for input(s): CHOL, HDL, LDLCALC, TRIG, CHOLHDL, LDLDIRECT in the last 72 hours. Thyroid Function Tests: No results for input(s): TSH, T4TOTAL, FREET4, T3FREE, THYROIDAB in the last 72 hours. Anemia Panel: No results for input(s): VITAMINB12, FOLATE, FERRITIN, TIBC, IRON, RETICCTPCT in the last 72 hours. Sepsis Labs: Recent Labs  Lab 10/03/18 1950  LATICACIDVEN 1.1    Recent Results (from the past 240 hour(s))  SARS Coronavirus 2 (CEPHEID- Performed in [place] hospital lab), Hosp Order     Status: None   Collection Time: 10/03/18  7:50 PM   Specimen: Nasopharyngeal Swab  Result Value Ref Range Status   SARS Coronavirus 2 NEGATIVE NEGATIVE Final    Comment: (NOTE) If result is NEGATIVE SARS-CoV-2 target nucleic acids are NOT DETECTED. The SARS-CoV-2 RNA is generally detectable in upper and lower  respiratory specimens during the acute phase of infection. The lowest  concentration of SARS-CoV-2 viral copies this assay can detect is 250  copies / mL. A negative result does not preclude SARS-CoV-2 infection  and should not be used as the sole basis for  treatment or other  [name] management decisions.  A negative result may occur with  improper specimen collection / handling, submission of specimen other  than nasopharyngeal swab, presence of viral mutation(s) within the  areas targeted by this assay, and inadequate number of viral copies  (<250 copies / mL). A negative result must be combined with clinical  observations, [name] history, and epidemiological information. If result is POSITIVE SARS-CoV-2 target nucleic acids are DETECTED. The SARS-CoV-2 RNA is generally detectable in upper and lower  respiratory specimens dur ing the acute phase of infection.  Positive  results are indicative of active infection with SARS-CoV-2.  Clinical  correlation with [name] history and other diagnostic information is  necessary to determine [name] infection status.  Positive results do  not rule out bacterial infection or co-infection with other viruses. If result is PRESUMPTIVE POSTIVE SARS-CoV-2 nucleic acids MAY BE PRESENT.   A presumptive positive result was obtained  on the submitted specimen  and confirmed on repeat testing.  While 2019 novel coronavirus  (SARS-CoV-2) nucleic acids may be present in the submitted sample  additional confirmatory testing may be necessary for epidemiological  and / or clinical management purposes  to differentiate between  SARS-CoV-2 and other Sarbecovirus currently known to infect humans.  If clinically indicated additional testing with an alternate test  methodology [phone]) is advised. The SARS-CoV-2 RNA is generally  detectable in upper and lower respiratory sp ecimens during the acute  phase of infection. The expected result is Negative. Fact Sheet for Patients:  StrictlyIdeas.no Fact Sheet for Healthcare Providers: BankingDealers.co.za This test is not yet approved or cleared by the [place] FDA and has been authorized for detection and/or  diagnosis of SARS-CoV-2 by FDA under an Emergency Use Authorization (EUA).  This EUA will remain in effect (meaning this test can be used) for the duration of the COVID-19 declaration under Section 564(b)(1) of the Act, 21 U.S.C. section 360bbb-3(b)(1), unless the authorization is terminated or revoked sooner. Performed at [facility], [place] [address] [place], [place] [address]          Radiology Studies: Dg Chest Portable 1 View  Result Date: 10/03/2018 CLINICAL DATA:  Chills EXAM: PORTABLE CHEST 1 VIEW COMPARISON:  09/19/2018, 02/09/2018, 08/23/2018, 08/12/2018 FINDINGS: Persistent airspace opacity at the left base. This is a waxing and waning finding. Normal heart size. No pleural effusion. Old left rib fractures. IMPRESSION: Patchy airspace disease at the left lung base intermittently visualized on multiple priors, possible recurrent pneumonia or aspiration Electronically Signed   By: Donavan Foil M.D.   On: 10/03/2018 19:42        Scheduled Meds: . amLODipine  10 mg Oral Daily  . enoxaparin (LOVENOX) injection  40 mg Subcutaneous Q24H  . folic acid  1 mg Oral Daily  . nicotine  21 mg Transdermal Daily  . pantoprazole  40 mg Oral BID  . pregabalin  50 mg Oral BID  . risperiDONE  0.25 mg Oral BID  . sucralfate  1 g Oral TID WC & HS   Continuous Infusions: . sodium chloride Stopped (10/04/18 1696)  . dextrose 5 % and 0.45% NaCl 75 mL/hr at 10/03/18 2250  . dextrose 5 % and 0.45% NaCl 100 mL/hr at 10/04/18 7893  . piperacillin-tazobactam (ZOSYN)  IV 3.375 g (10/04/18 8101)  . [START ON 10/05/2018] vancomycin       LOS: 1 day    Time spent: 35 minutes.     Elmarie Shiley, MD [name] Pager [phone]  If 7PM-7AM, please contact night-coverage [email or web address] Password [email or web address] 10/04/2018, 7:30 AM

## 2018-10-05 DIAGNOSIS — T50901A Poisoning by unspecified drugs, medicaments and biological substances, accidental (unintentional), initial encounter: Secondary | ICD-10-CM

## 2018-10-05 LAB — BASIC METABOLIC PANEL
Anion gap: 9 (ref 5–15)
BUN: 15 mg/dL (ref 8–23)
CO2: 25 mmol/L (ref 22–32)
Calcium: 8.7 mg/dL — ABNORMAL LOW (ref 8.9–10.3)
Chloride: 100 mmol/L (ref 98–111)
Creatinine, Ser: 1.13 mg/dL (ref 0.61–1.24)
GFR calc Af Amer: >60 mL/min (ref >60)
GFR calc non Af Amer: >60 mL/min (ref >60)
Glucose, Bld: 144 mg/dL — ABNORMAL HIGH (ref 70–99)
Potassium: 3.3 mmol/L — ABNORMAL LOW (ref 3.5–5.1)
Sodium: 134 mmol/L — ABNORMAL LOW (ref 135–145)

## 2018-10-05 LAB — GLUCOSE, CAPILLARY
Glucose-Capillary: 127 mg/dL — ABNORMAL HIGH (ref 70–99)
Glucose-Capillary: 152 mg/dL — ABNORMAL HIGH (ref 70–99)
Glucose-Capillary: 273 mg/dL — ABNORMAL HIGH (ref 70–99)
Glucose-Capillary: 285 mg/dL — ABNORMAL HIGH (ref 70–99)

## 2018-10-05 LAB — CBC
HCT: 31.2 % — ABNORMAL LOW (ref 39.0–52.0)
Hemoglobin: 10 g/dL — ABNORMAL LOW (ref 13.0–17.0)
MCH: 27.9 pg (ref 26.0–34.0)
MCHC: 32.1 g/dL (ref 30.0–36.0)
MCV: 86.9 fL (ref 80.0–100.0)
Platelets: 290 10*3/uL (ref 150–400)
RBC: 3.59 MIL/uL — ABNORMAL LOW (ref 4.22–5.81)
RDW: 13.9 % (ref 11.5–15.5)
WBC: 6.6 10*3/uL (ref 4.0–10.5)
nRBC: 0 % (ref 0.0–0.2)

## 2018-10-05 LAB — CREATININE, SERUM
Creatinine, Ser: 1.16 mg/dL (ref 0.61–1.24)
GFR calc Af Amer: >60 mL/min (ref >60)
GFR calc non Af Amer: >60 mL/min (ref >60)

## 2018-10-05 LAB — MRSA PCR SCREENING: MRSA by PCR: NEGATIVE

## 2018-10-05 MED ORDER — LORAZEPAM 1 MG PO TABS
1.0000 mg | ORAL_TABLET | Freq: Once | ORAL | Status: AC
  Administered 2018-10-06: 1 mg via ORAL
  Filled 2018-10-05 (×2): qty 1

## 2018-10-05 MED ORDER — LORAZEPAM 2 MG/ML IJ SOLN
0.5000 mg | Freq: Four times a day (QID) | INTRAMUSCULAR | Status: DC | PRN
  Administered 2018-10-05 – 2018-10-06 (×2): 0.5 mg via INTRAVENOUS
  Filled 2018-10-05 (×2): qty 1

## 2018-10-05 MED ORDER — POTASSIUM CHLORIDE 10 MEQ/100ML IV SOLN
10.0000 meq | INTRAVENOUS | Status: AC
  Administered 2018-10-05 (×3): 10 meq via INTRAVENOUS
  Filled 2018-10-05 (×3): qty 100

## 2018-10-05 MED ORDER — INSULIN GLARGINE 100 UNIT/ML ~~LOC~~ SOLN
5.0000 [IU] | Freq: Every day | SUBCUTANEOUS | Status: DC
  Administered 2018-10-05: 5 [IU] via SUBCUTANEOUS
  Filled 2018-10-05 (×2): qty 0.05

## 2018-10-05 NOTE — Progress Notes (Signed)
PT Cancellation Note  Patient Details Name: [name] MRN: [id number] DOB: Sep 16, 1955   Cancelled Treatment:    Reason Eval/Treat Not Completed: Fatigue/lethargy limiting ability to participate. Attempted twice to see pt for PT eval. Pt sleeping soundly and not awakening to voice.  Did not attempt sternal rub due to not wanting to agitate pt, due to noted agitation and verbal aggression towards staff.  Will check back as schedule permits.   Galen Manila 10/05/2018, 2:23 PM

## 2018-10-05 NOTE — Progress Notes (Signed)
PROGRESS NOTE    [name]  IZT:[id number] DOB: Mar 14, 1956 DOA: 10/03/2018 PCP: Patient, No Pcp Per    Brief Narrative; 63 year old with past medical history significant for diabetes insulin-dependent, depression, hepatitis C, hypertension, polysubstance abuse with overdose who presents to the emergency department for evaluation of altered mental status.  Patient was found facedown laying in the driveway.  EMS arrival blood sugar was 36.  He is stated that he is not her heroine which he does not do very often.  See if Narcan and dextrose by EMS.  Evaluation in the ED; patient was found to be febrile, hypoxic, low blood pressure.  Chest x-ray with possible pneumonia.  UDS positive for amphetamine and marijuana, UA negative.  Source coronavirus 2-.  BG 56   Assessment & Plan:   Active Problems:   Hypoglycemia  1 -Acute metabolic encephalopathy: Related to hypoglycemia, drug use. Patient agitated screaming at staff.  Threatening to leave AMA.  Patient with fever and acute illness I do not think that he is able to leave AMA. Continue with Ativan as needed.  Continue with treatment of pneumonia He remain confuse, sleepy. Received ativan last night.   2-Hypoglycemia: Blood sugar on admission at 35.  Patient does use insulin at home. He received D5 IV fluids.  His blood sugar has been increasing more than 200.   Aspiration pneumonia: Chest x-ray with left lower lobe infiltrate.  Patient is febrile. Continue with IV antibiotics  Diabetes insulin-dependent: Resume a sliding scale insulin. On lantus , dose to be hold if patient doesn't eat.   Syncope: Likely related to hypoglycemia and drug use.  Resolved  History of hypertension: Continue with Norvasc.  PRN hydralazine.  History of GERD continue with PPI.  Anemia: Check anemia panel in the morning. With hemoglobin.  Hyponatremia: Continue with normal saline.  Hypokalemia; IV KCL.   Urinary retention;  Place foley   Estimated body mass index is 18.48 kg/m as calculated from the following:   Height as of this encounter: 6' (1.829 m).   Weight as of this encounter: 61.8 kg.   DVT prophylaxis: Lovenox Code Status: Full code Family Communication:  Son didn't answer phone yesterday. Will try to contact family again today  Disposition Plan: Remain in the hospital for treatment of infection encephalopathy.  Consultants:   None   Procedures:  None   Antimicrobials:  Vancomycin and Zosyn   Subjective: He was agitated overnight.  He is sleepy this am. abdomen distended and tender bladder scan more 900 Has productive cough   Objective: Vitals:   10/05/18 0000 10/05/18 0137 10/05/18 0523 10/05/18 1343  BP:  (!) 173/81 (!) 145/87 (!) 133/54  Pulse:   (!) 105 79  Resp:   20 (!) 22  Temp: 98.4 F (36.9 C)  98.5 F (36.9 C) 99.4 F (37.4 C)  TempSrc: Oral  Oral Oral  SpO2:   98% 97%  Weight:      Height:        Intake/Output Summary (Last 24 hours) at 10/05/2018 1535 Last data filed at 10/05/2018 1227 Gross per 24 hour  Intake 985.48 ml  Output 3400 ml  Net -2414.52 ml   Filed Weights   10/03/18 1855 10/03/18 1859 10/04/18 0129  Weight: 72.6 kg 72.5 kg 61.8 kg    Examination:  General exam: Sleepy  Respiratory system: Bilateral ronchus Cardiovascular system: S 1, S  2 RRR Gastrointestinal system: BS present. Abdomen distended, supra pubic tenderness Central nervous system: sleepy  Extremities:  No edema Skin: no rashes.  Psychiatry: he was Agitated last night     Data Reviewed: I have personally reviewed following labs and imaging studies  CBC: Recent Labs  Lab 10/03/18 1904 10/04/18 0410 10/05/18 0811  WBC 6.8 6.1 6.6  HGB 9.4* 8.4* 10.0*  HCT 30.4* 26.2* 31.2*  MCV 89.9 87.3 86.9  PLT 326 271 633   Basic Metabolic Panel: Recent Labs  Lab 10/03/18 1904 10/04/18 0410 10/05/18 0421 10/05/18 0811  NA 133* 133*  --  134*  K 4.5 4.3  --  3.3*  CL 99 100   --  100  CO2 26 24  --  25  GLUCOSE 82 179*  --  144*  BUN 30* 25*  --  15  CREATININE 1.13 1.17 1.16 1.13  CALCIUM 8.8* 8.2*  --  8.7*  MG  --  1.9  --   --   PHOS  --  3.0  --   --    GFR: Estimated Creatinine Clearance: 59.2 mL/min (by C-G formula based on SCr of 1.13 mg/dL). Liver Function Tests: Recent Labs  Lab 10/03/18 1904  AST 30  ALT 31  ALKPHOS 108  BILITOT 0.4  PROT 6.8  ALBUMIN 2.9*   No results for input(s): LIPASE, AMYLASE in the last 168 hours. No results for input(s): AMMONIA in the last 168 hours. Coagulation Profile: No results for input(s): INR, PROTIME in the last 168 hours. Cardiac Enzymes: No results for input(s): CKTOTAL, CKMB, CKMBINDEX, TROPONINI in the last 168 hours. BNP (last 3 results) No results for input(s): PROBNP in the last 8760 hours. HbA1C: No results for input(s): HGBA1C in the last 72 hours. CBG: Recent Labs  Lab 10/04/18 1212 10/04/18 1709 10/04/18 2154 10/05/18 0715 10/05/18 1155  GLUCAP 369* 404* 216* 127* 152*   Lipid Profile: No results for input(s): CHOL, HDL, LDLCALC, TRIG, CHOLHDL, LDLDIRECT in the last 72 hours. Thyroid Function Tests: No results for input(s): TSH, T4TOTAL, FREET4, T3FREE, THYROIDAB in the last 72 hours. Anemia Panel: No results for input(s): VITAMINB12, FOLATE, FERRITIN, TIBC, IRON, RETICCTPCT in the last 72 hours. Sepsis Labs: Recent Labs  Lab 10/03/18 1950  LATICACIDVEN 1.1    Recent Results (from the past 240 hour(s))  SARS Coronavirus 2 (CEPHEID- Performed in [place] hospital lab), Hosp Order     Status: None   Collection Time: 10/03/18  7:50 PM   Specimen: Nasopharyngeal Swab  Result Value Ref Range Status   SARS Coronavirus 2 NEGATIVE NEGATIVE Final    Comment: (NOTE) If result is NEGATIVE SARS-CoV-2 target nucleic acids are NOT DETECTED. The SARS-CoV-2 RNA is generally detectable in upper and lower  respiratory specimens during the acute phase of infection. The lowest   concentration of SARS-CoV-2 viral copies this assay can detect is 250  copies / mL. A negative result does not preclude SARS-CoV-2 infection  and should not be used as the sole basis for treatment or other  patient management decisions.  A negative result may occur with  improper specimen collection / handling, submission of specimen other  than nasopharyngeal swab, presence of viral mutation(s) within the  areas targeted by this assay, and inadequate number of viral copies  (<250 copies / mL). A negative result must be combined with clinical  observations, patient history, and epidemiological information. If result is POSITIVE SARS-CoV-2 target nucleic acids are DETECTED. The SARS-CoV-2 RNA is generally detectable in upper and lower  respiratory specimens dur ing the acute phase of infection.  Positive  results are indicative of active infection with SARS-CoV-2.  Clinical  correlation with patient history and other diagnostic information is  necessary to determine patient infection status.  Positive results do  not rule out bacterial infection or co-infection with other viruses. If result is PRESUMPTIVE POSTIVE SARS-CoV-2 nucleic acids MAY BE PRESENT.   A presumptive positive result was obtained on the submitted specimen  and confirmed on repeat testing.  While 2019 novel coronavirus  (SARS-CoV-2) nucleic acids may be present in the submitted sample  additional confirmatory testing may be necessary for epidemiological  and / or clinical management purposes  to differentiate between  SARS-CoV-2 and other Sarbecovirus currently known to infect humans.  If clinically indicated additional testing with an alternate test  methodology [phone]) is advised. The SARS-CoV-2 RNA is generally  detectable in upper and lower respiratory sp ecimens during the acute  phase of infection. The expected result is Negative. Fact Sheet for Patients:  StrictlyIdeas.no Fact Sheet  for Healthcare Providers: BankingDealers.co.za This test is not yet approved or cleared by the [place] FDA and has been authorized for detection and/or diagnosis of SARS-CoV-2 by FDA under an Emergency Use Authorization (EUA).  This EUA will remain in effect (meaning this test can be used) for the duration of the COVID-19 declaration under Section 564(b)(1) of the Act, 21 U.S.C. section 360bbb-3(b)(1), unless the authorization is terminated or revoked sooner. Performed at [facility], [place] [address] [place], [place] [address]   Culture, blood (routine x 2)     Status: None (Preliminary result)   Collection Time: 10/03/18  8:04 PM   Specimen: BLOOD LEFT HAND  Result Value Ref Range Status   Specimen Description   Final    BLOOD LEFT HAND Performed at [place] [address] [place], [place] [address]    Special Requests   Final    BOTTLES DRAWN AEROBIC AND ANAEROBIC Blood Culture adequate volume Performed at [place] [address], [place], [place] [address]    Culture   Final    NO GROWTH 1 DAY Performed at [facility], [place] [address], [place], [place] [address]    Report Status PENDING  Incomplete  Culture, blood (routine x 2)     Status: None (Preliminary result)   Collection Time: 10/03/18  8:05 PM   Specimen: Right Antecubital; Blood  Result Value Ref Range Status   Specimen Description   Final    RIGHT ANTECUBITAL Performed at [place] [address] [place], [place] [address]    Special Requests   Final    BOTTLES DRAWN AEROBIC AND ANAEROBIC Blood Culture adequate volume Performed at [place] [address], [place], [place] [address]    Culture   Final    NO GROWTH 1 DAY Performed at [facility], [place] [address], [place], [place] [address]    Report Status PENDING  Incomplete         Radiology  Studies: Dg Chest Portable 1 View  Result Date: 10/03/2018 CLINICAL DATA:  Chills EXAM: PORTABLE CHEST 1 VIEW COMPARISON:  09/19/2018, 02/09/2018, 08/23/2018, 08/12/2018 FINDINGS: Persistent airspace opacity at the left base. This is a waxing and waning finding. Normal heart size. No pleural effusion. Old left rib fractures. IMPRESSION: Patchy airspace disease at the left lung base intermittently visualized on multiple priors, possible recurrent pneumonia or aspiration Electronically Signed   By: Madie Reno.D.  On: 10/03/2018 19:42        Scheduled Meds: . amLODipine  10 mg Oral Daily  . enoxaparin (LOVENOX) injection  40 mg Subcutaneous Q24H  . folic acid  1 mg Oral Daily  . insulin aspart  0-9 Units Subcutaneous TID WC  . insulin glargine  5 Units Subcutaneous QHS  . nicotine  21 mg Transdermal Daily  . pantoprazole (PROTONIX) IV  40 mg Intravenous Q24H  . pregabalin  50 mg Oral BID  . risperiDONE  0.25 mg Oral BID  . sucralfate  1 g Oral TID WC & HS   Continuous Infusions: . sodium chloride Stopped (10/04/18 2025)  . sodium chloride 100 mL/hr at 10/05/18 0937  . piperacillin-tazobactam (ZOSYN)  IV Stopped (10/05/18 0645)  . vancomycin Stopped (10/05/18 0645)     LOS: 2 days    Time spent: 35 minutes.     Elmarie Shiley, MD [name] Pager [phone]  If 7PM-7AM, please contact night-coverage [email or web address] Password [facility] 10/05/2018, 3:35 PM

## 2018-10-05 NOTE — Progress Notes (Signed)
Patient continue to be impulsive, pulling tele off, noncompliant, aggressive and verbally abusive to staff  trying to get out of bed, stated that he has to void, reminded patient that he has condom cath inplace and about 900cc urine in the bag, patient then stated he need to go smoke, reinforced this is a hospital and it's a smoke free facility, he said he knows that's why he is trying to go outside. Temp 101, PRN tylenol and  PRN ativan given. Will continue to assess patient.

## 2018-10-05 NOTE — Progress Notes (Signed)
Patient refuse to be swabbed for MRSA.

## 2018-10-05 NOTE — Progress Notes (Signed)
Patient is awake. He is very agitated. Cursing at staff. States he will leave the hospital, PCP was notified.

## 2018-10-06 LAB — BASIC METABOLIC PANEL
Anion gap: 10 (ref 5–15)
BUN: 16 mg/dL (ref 8–23)
CO2: 21 mmol/L — ABNORMAL LOW (ref 22–32)
Calcium: 8.2 mg/dL — ABNORMAL LOW (ref 8.9–10.3)
Chloride: 104 mmol/L (ref 98–111)
Creatinine, Ser: 1.2 mg/dL (ref 0.61–1.24)
GFR calc Af Amer: >60 mL/min (ref >60)
GFR calc non Af Amer: >60 mL/min (ref >60)
Glucose, Bld: 254 mg/dL — ABNORMAL HIGH (ref 70–99)
Potassium: 3.4 mmol/L — ABNORMAL LOW (ref 3.5–5.1)
Sodium: 135 mmol/L (ref 135–145)

## 2018-10-06 LAB — GLUCOSE, CAPILLARY
Glucose-Capillary: 273 mg/dL — ABNORMAL HIGH (ref 70–99)
Glucose-Capillary: 198 mg/dL — ABNORMAL HIGH (ref 70–99)
Glucose-Capillary: 315 mg/dL — ABNORMAL HIGH (ref 70–99)
Glucose-Capillary: 180 mg/dL — ABNORMAL HIGH (ref 70–99)

## 2018-10-06 LAB — CBC
HCT: 27.5 % — ABNORMAL LOW (ref 39.0–52.0)
Hemoglobin: 9 g/dL — ABNORMAL LOW (ref 13.0–17.0)
MCH: 28.3 pg (ref 26.0–34.0)
MCHC: 32.7 g/dL (ref 30.0–36.0)
MCV: 86.5 fL (ref 80.0–100.0)
Platelets: 255 10*3/uL (ref 150–400)
RBC: 3.18 MIL/uL — ABNORMAL LOW (ref 4.22–5.81)
RDW: 14 % (ref 11.5–15.5)
WBC: 5.7 10*3/uL (ref 4.0–10.5)
nRBC: 0 % (ref 0.0–0.2)

## 2018-10-06 MED ORDER — TAMSULOSIN HCL 0.4 MG PO CAPS
0.4000 mg | ORAL_CAPSULE | Freq: Every day | ORAL | Status: DC
  Administered 2018-10-06 – 2018-10-10 (×5): 0.4 mg via ORAL
  Filled 2018-10-06 (×5): qty 1

## 2018-10-06 MED ORDER — POTASSIUM CHLORIDE CRYS ER 20 MEQ PO TBCR
40.0000 meq | EXTENDED_RELEASE_TABLET | Freq: Once | ORAL | Status: AC
  Administered 2018-10-06: 40 meq via ORAL
  Filled 2018-10-06: qty 2

## 2018-10-06 MED ORDER — INSULIN GLARGINE 100 UNIT/ML ~~LOC~~ SOLN
15.0000 [IU] | Freq: Every day | SUBCUTANEOUS | Status: DC
  Administered 2018-10-06: 15 [IU] via SUBCUTANEOUS
  Filled 2018-10-06: qty 0.15

## 2018-10-06 NOTE — Care Management Important Message (Signed)
Important Message  Patient Details IM Letter given to Shar[facility] SW to present to the Patient Name: [name] MRN: [id number] Date of Birth: [birth year]   Medicare Important Message Given:  Yes     Kerin Salen 10/06/2018, 10:58 AM

## 2018-10-06 NOTE — Progress Notes (Signed)
Patient still saying that he "demand food, no more waiting",RN explained again that the patient was NPO because the doctor wanted a swallow test done.Patient yelled obscenities and said he wanted something to eat. RN reminded patient that he had Pneumonia and had possibly aspirated. Patient did not listen. Ranted about wrecking the room and demanding he get food.

## 2018-10-06 NOTE — Progress Notes (Signed)
PROGRESS NOTE    [name]  EAV:[id number] DOB: [birth year] DOA: 10/03/2018 PCP: Patient, No Pcp Per    Brief Narrative; 63 year old with past medical history significant for diabetes insulin-dependent, depression, hepatitis C, hypertension, polysubstance abuse with overdose who presents to the emergency department for evaluation of altered mental status.  Patient was found facedown laying in the driveway.  EMS arrival blood sugar was 36.  He is stated that he is not her heroine which he does not do very often.  See if Narcan and dextrose by EMS.  Evaluation in the ED; patient was found to be febrile, hypoxic, low blood pressure.  Chest x-ray with possible pneumonia.  UDS positive for amphetamine and marijuana, UA negative.  Source coronavirus 2-.  BG 56   Assessment & Plan:   Active Problems:   Hypoglycemia  1 -Acute Metabolic Encephalopathy: Related to hypoglycemia, drug use. Patient agitated screaming at staff.  Threatening to leave AMA.  Patient with fever and acute illness I do not think that he is able to leave AMA. Continue with Ativan as needed.  Continue with treatment of pneumonia Appears less agitated, still confuse.   2-Hypoglycemia: Blood sugar on admission at 35.  Patient does use insulin at home. He received D5 IV fluids.  His blood sugar has been increasing more than 200.   Aspiration pneumonia: Chest x-ray with left lower lobe infiltrate.  Patient is febrile. Continue with IV antibiotics. Discontinue vancomycin.  On IV zosyn.  Speech evaluation, now on dysphagia 3 diet.   Diabetes insulin-dependent: Resume a sliding scale insulin. On lantus and SSI.   Syncope: Likely related to hypoglycemia and drug use.  Resolved  History of hypertension: Continue with Norvasc.  PRN hydralazine.  History of GERD continue with PPI.  Anemia: Check anemia panel in the morning. Monitor hb.   Hyponatremia: Continue with normal saline.  Hypokalemia;replete orally. Check  mg level  Urinary retention;  Foley catheter place 6-22.  Start Flomax.  voiding trial 6-24  Estimated body mass index is 18.48 kg/m as calculated from the following:   Height as of this encounter: 6' (1.829 m).   Weight as of this encounter: 61.8 kg.   DVT prophylaxis: Lovenox Code Status: Full code Family Communication:  Son didn't answer phone, left message.  Disposition Plan: Remain in the hospital for treatment of infection encephalopathy.  Consultants:   None   Procedures:  None   Antimicrobials:  Vancomycin and Zosyn   Subjective: Patient appears calm.  Oriented to person only.  Denies abdominal pain, still coughing phlemg  Objective: Vitals:   10/06/18 0333 10/06/18 0533 10/06/18 0944 10/06/18 1349  BP: (!) 152/79 123/62 125/66 (!) 117/51  Pulse: 86 72  74  Resp: 18   16  Temp: 98.2 F (36.8 C)   98 F (36.7 C)  TempSrc:    Oral  SpO2: 99%   97%  Weight:      Height:        Intake/Output Summary (Last 24 hours) at 10/06/2018 1349 Last data filed at 10/05/2018 2100 Gross per 24 hour  Intake 317.41 ml  Output 400 ml  Net -82.59 ml   Filed Weights   10/03/18 1855 10/03/18 1859 10/04/18 0129  Weight: 72.6 kg 72.5 kg 61.8 kg    Examination:  General exam: Alert, confuse Respiratory system:Bilateral ronchus.  Cardiovascular system: S 1, S 2 RRR Gastrointestinal system: BS present, soft, nt  Central nervous system:alert, follow some command.  Extremities:  No edema Skin:No  rashes  Psychiatry; more calm.     Data Reviewed: I have personally reviewed following labs and imaging studies  CBC: Recent Labs  Lab 10/03/18 1904 10/04/18 0410 10/05/18 0811 10/06/18 0541  WBC 6.8 6.1 6.6 5.7  HGB 9.4* 8.4* 10.0* 9.0*  HCT 30.4* 26.2* 31.2* 27.5*  MCV 89.9 87.3 86.9 86.5  PLT 326 271 290 063   Basic Metabolic Panel: Recent Labs  Lab 10/03/18 1904 10/04/18 0410 10/05/18 0421 10/05/18 0811 10/06/18 0541  NA 133* 133*  --  134* 135   K 4.5 4.3  --  3.3* 3.4*  CL 99 100  --  100 104  CO2 26 24  --  25 21*  GLUCOSE 82 179*  --  144* 254*  BUN 30* 25*  --  15 16  CREATININE 1.13 1.17 1.16 1.13 1.20  CALCIUM 8.8* 8.2*  --  8.7* 8.2*  MG  --  1.9  --   --   --   PHOS  --  3.0  --   --   --    GFR: Estimated Creatinine Clearance: 55.8 mL/min (by C-G formula based on SCr of 1.2 mg/dL). Liver Function Tests: Recent Labs  Lab 10/03/18 1904  AST 30  ALT 31  ALKPHOS 108  BILITOT 0.4  PROT 6.8  ALBUMIN 2.9*   No results for input(s): LIPASE, AMYLASE in the last 168 hours. No results for input(s): AMMONIA in the last 168 hours. Coagulation Profile: No results for input(s): INR, PROTIME in the last 168 hours. Cardiac Enzymes: No results for input(s): CKTOTAL, CKMB, CKMBINDEX, TROPONINI in the last 168 hours. BNP (last 3 results) No results for input(s): PROBNP in the last 8760 hours. HbA1C: No results for input(s): HGBA1C in the last 72 hours. CBG: Recent Labs  Lab 10/05/18 1155 10/05/18 1633 10/05/18 2010 10/06/18 0747 10/06/18 1201  GLUCAP 152* 273* 285* 273* 198*   Lipid Profile: No results for input(s): CHOL, HDL, LDLCALC, TRIG, CHOLHDL, LDLDIRECT in the last 72 hours. Thyroid Function Tests: No results for input(s): TSH, T4TOTAL, FREET4, T3FREE, THYROIDAB in the last 72 hours. Anemia Panel: No results for input(s): VITAMINB12, FOLATE, FERRITIN, TIBC, IRON, RETICCTPCT in the last 72 hours. Sepsis Labs: Recent Labs  Lab 10/03/18 1950  LATICACIDVEN 1.1    Recent Results (from the past 240 hour(s))  SARS Coronavirus 2 (CEPHEID- Performed in [place] hospital lab), Hosp Order     Status: None   Collection Time: 10/03/18  7:50 PM   Specimen: Nasopharyngeal Swab  Result Value Ref Range Status   SARS Coronavirus 2 NEGATIVE NEGATIVE Final    Comment: (NOTE) If result is NEGATIVE SARS-CoV-2 target nucleic acids are NOT DETECTED. The SARS-CoV-2 RNA is generally detectable in upper and lower   respiratory specimens during the acute phase of infection. The lowest  concentration of SARS-CoV-2 viral copies this assay can detect is 250  copies / mL. A negative result does not preclude SARS-CoV-2 infection  and should not be used as the sole basis for treatment or other  patient management decisions.  A negative result may occur with  improper specimen collection / handling, submission of specimen other  than nasopharyngeal swab, presence of viral mutation(s) within the  areas targeted by this assay, and inadequate number of viral copies  (<250 copies / mL). A negative result must be combined with clinical  observations, patient history, and epidemiological information. If result is POSITIVE SARS-CoV-2 target nucleic acids are DETECTED. The SARS-CoV-2 RNA is generally  detectable in upper and lower  respiratory specimens dur ing the acute phase of infection.  Positive  results are indicative of active infection with SARS-CoV-2.  Clinical  correlation with patient history and other diagnostic information is  necessary to determine patient infection status.  Positive results do  not rule out bacterial infection or co-infection with other viruses. If result is PRESUMPTIVE POSTIVE SARS-CoV-2 nucleic acids MAY BE PRESENT.   A presumptive positive result was obtained on the submitted specimen  and confirmed on repeat testing.  While 2019 novel coronavirus  (SARS-CoV-2) nucleic acids may be present in the submitted sample  additional confirmatory testing may be necessary for epidemiological  and / or clinical management purposes  to differentiate between  SARS-CoV-2 and other Sarbecovirus currently known to infect humans.  If clinically indicated additional testing with an alternate test  methodology [phone]) is advised. The SARS-CoV-2 RNA is generally  detectable in upper and lower respiratory sp ecimens during the acute  phase of infection. The expected result is Negative. Fact  Sheet for Patients:  StrictlyIdeas.no Fact Sheet for Healthcare Providers: BankingDealers.co.za This test is not yet approved or cleared by the [place] FDA and has been authorized for detection and/or diagnosis of SARS-CoV-2 by FDA under an Emergency Use Authorization (EUA).  This EUA will remain in effect (meaning this test can be used) for the duration of the COVID-19 declaration under Section 564(b)(1) of the Act, 21 U.S.C. section 360bbb-3(b)(1), unless the authorization is terminated or revoked sooner. Performed at [facility], [place] [address], [place], [place] [address]   Culture, blood (routine x 2)     Status: None (Preliminary result)   Collection Time: 10/03/18  8:04 PM   Specimen: BLOOD LEFT HAND  Result Value Ref Range Status   Specimen Description   Final    BLOOD LEFT HAND Performed at [facility] [address] [place], [place] [address]    Special Requests   Final    BOTTLES DRAWN AEROBIC AND ANAEROBIC Blood Culture adequate volume Performed at [facility] [address], [place], [place] [address]    Culture   Final    NO GROWTH 2 DAYS Performed at [facility] [address] [place], [place] [address]    Report Status PENDING  Incomplete  Culture, blood (routine x 2)     Status: None (Preliminary result)   Collection Time: 10/03/18  8:05 PM   Specimen: Right Antecubital; Blood  Result Value Ref Range Status   Specimen Description   Final    RIGHT ANTECUBITAL Performed at [facility] [address], [place], [place] [address]    Special Requests   Final    BOTTLES DRAWN AEROBIC AND ANAEROBIC Blood Culture adequate volume Performed at [facility] [address], [place], [place] [address]    Culture   Final    NO GROWTH 2 DAYS Performed at [facility] [address], [place], [place]  [address]    Report Status PENDING  Incomplete  MRSA PCR Screening     Status: None   Collection Time: 10/04/18  1:50 AM   Specimen: Nasal Mucosa; Nasopharyngeal  Result Value Ref Range Status   MRSA by PCR NEGATIVE NEGATIVE Final    Comment:        The GeneXpert MRSA Assay (FDA approved for NASAL specimens only), is one component of a comprehensive MRSA colonization surveillance program. It is not  intended to diagnose MRSA infection nor to guide or monitor treatment for MRSA infections. Performed at [facility], [place] [address], [place], [place] [address]          Radiology Studies: No results found.      Scheduled Meds: . amLODipine  10 mg Oral Daily  . folic acid  1 mg Oral Daily  . insulin aspart  0-9 Units Subcutaneous TID WC  . insulin glargine  5 Units Subcutaneous QHS  . nicotine  21 mg Transdermal Daily  . pantoprazole (PROTONIX) IV  40 mg Intravenous Q24H  . pregabalin  50 mg Oral BID  . risperiDONE  0.25 mg Oral BID  . sucralfate  1 g Oral TID WC & HS  . tamsulosin  0.4 mg Oral QPC supper   Continuous Infusions: . sodium chloride Stopped (10/04/18 1103)  . sodium chloride Stopped (10/05/18 1833)  . piperacillin-tazobactam (ZOSYN)  IV 3.375 g (10/06/18 1318)     LOS: 3 days    Time spent: 35 minutes.     Elmarie Shiley, MD [name] Pager [phone]  If 7PM-7AM, please contact night-coverage [email or web address] Password [facility] 10/06/2018, 1:49 PM

## 2018-10-06 NOTE — TOC Initial Note (Signed)
Transition of Care [facility]) - Initial/Assessment Note    Patient Details  Name: [name] MRN: [id number] Date of Birth: 09-02-1955  Transition of [facility]) CM/SW Contact:    Nila Nephew, LCSW Phone Number: [phone] 10/06/2018, 2:43 PM  Clinical Narrative:   Pt admitted with  Hypoglycemia and encephalopathy. Pt was agitated upon admission per report but today presented oriented and cooperative with CSW assessment. Pt reports that he often has issues with his blood glucose levels and "goes unconscious." Pt also reports he uses TH and, cocaine on weekly basis.  Reported he "made a mistake and snorted fentanyl last week" however pt's UDS on admission is +amphetamines and cocaine.    Pt discussed recommendation for SNF with CSW- he reports he has been to SNF in the past and done HHPT. He states his baseline for past couple of months as been "in a recliner about 18 hours a day, some days I can get up and bathe and to the bathroom and some days I can't." Unclear on how much his family is available to assist him. Review of chart indicates pt admission to [facility] last month SNF was also sought and unable to obtain bed offers due to hx of agitation and ongoing substance use. Pt [place] home with home health at that time. Pt states he is aware of that and is hoping to go to SNF and stay abstinent during rehab.  Pt has [facility] which will need prior authorization by facility when bed is obtained. Completed FL2 and referrals will follow up with bed offers. May be limited due to active substance use.              Expected Discharge Plan: Skilled Nursing Facility Barriers to Discharge: Continued Medical Work up, Ship broker, SNF Pending bed offer   Patient Goals and CMS Choice Patient states their goals for this hospitalization and ongoing recovery are:: "I'd like to go back to rehab for short term not long term. The house is loud and busy and hard for me to recover there" CMS  Medicare.gov Compare Post Acute Care list provided to:: Patient Choice offered to / list presented to : Patient  Expected Discharge Plan and Services Expected Discharge Plan: [place] In-house Referral: Clinical Social Work Discharge Planning Services: CM Consult Post Acute Care Choice: [place] arrangements for the past 2 months: Single Family Home Expected Discharge Date: (unknown)                           [place] Agency: (Is active with [facility])        Prior Living Arrangements/Services Living arrangements for the past 2 months: Single Family Home Lives with:: Adult Children(grandchildren) Patient language and need for interpreter reviewed:: No Do you feel safe going back to the place where you live?: Yes      Need for Family Participation in Patient Care: No (Comment) Care giver support system in place?: Yes (comment)(lives with son and his girlfriend and their children) Current home services: DME Criminal Activity/Legal Involvement Pertinent to Current Situation/Hospitalization: No - Comment as needed  Activities of Daily Living Home Assistive Devices/Equipment: Gilford Rile (specify type) ADL Screening (condition at time of admission) Patient's cognitive ability adequate to safely complete daily activities?: Yes Is the patient deaf or have difficulty hearing?: No Does the patient have difficulty seeing, even when wearing glasses/contacts?: No Does the patient have difficulty concentrating, remembering, or making decisions?: No Patient  able to express need for assistance with ADLs?: Yes Does the patient have difficulty dressing or bathing?: No Independently performs ADLs?: Yes (appropriate for developmental age) Does the patient have difficulty walking or climbing stairs?: Yes Weakness of Legs: Both Weakness of Arms/Hands: Both  Permission Sought/Granted                  Emotional Assessment Appearance:: Appears stated  age Attitude/Demeanor/Rapport: Engaged Affect (typically observed): Appropriate Orientation: : Oriented to Self, Oriented to Place, Oriented to  Time, Oriented to Situation Alcohol / Substance Use: Not Applicable Psych Involvement: No (comment)  Admission diagnosis:  Hypoglycemia [E16.2] Accidental drug overdose, initial encounter [T50.901A] Pneumonia due to infectious organism, unspecified laterality, unspecified part of lung [J18.9] Patient Active Problem List   Diagnosis Date Noted  . Acute respiratory failure ([place])   . Altered mental status   . Gastroesophageal reflux disease   . Hallucinations   . Hypoglycemia associated with diabetes ([place]) 08/12/2018  . At risk for adverse drug event 02/17/2018  . Abrasions of multiple sites   . Demand ischemia of myocardium ([place]) 02/05/2018  . Acute metabolic encephalopathy 48/27/0786  . DKA (diabetic ketoacidoses) ([place]) 02/03/2018  . Leukocytosis 02/03/2018  . Hypoglycemia 12/24/2017  . Syncope 12/24/2017  . Syncope and collapse 12/23/2017  . IV drug abuse ([place]) 04/19/2017  . Acute encephalopathy 04/19/2017  . Hemangioma 10/02/2016  . Foreign body (FB) in soft tissue   . 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[place])   . Gangrene ([place])   . Type I (juvenile type) diabetes mellitus without mention of complication, not stated as uncontrolled   . Tobacco abuse   . Essential hypertension   . Diabetic infection of right foot ([place]) 07/22/2016  . Diabetic foot infection ([place]) 07/22/2016  . Chronic ulcer of heel, right, with fat layer exposed ([place]) 05/20/2016  . DKA, type 1 ([place]) 04/18/2016  . AKI (acute kidney injury) ([place]) 04/18/2016  . Hyponatremia 04/18/2016  . Severe protein-calorie malnutrition ([place]) 04/18/2016  . Elevated troponin 04/18/2016  . Poor dentition 07/12/2015  . Tobacco use 07/06/2013  . PAD (peripheral artery disease) ([place]) 04/29/2013  . COPD, mild ([place])   . 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 Diabetic neuropathy ([place]) 06/21/2010  . PROLIFERATIVE DIABETIC RETINOPATHY 06/21/2010  . SKIN TAG 01/30/2010  . Chronic hepatitis C ([place]) 11/22/2008  . VITILIGO 11/22/2008  . PROTEINURIA, MILD 11/22/2008  . Substance abuse ([place]) 04/23/2007  . DEPRESSION 11/11/2006   PCP:  Patient, No Pcp Per Pharmacy:   [facility] [address], [place] - [place] [address] [place] [place] [address] Phone: [phone] Fax: [phone]     Social Determinants of Health (SDOH) Interventions    Readmission Risk Interventions Readmission Risk Prevention Plan 08/12/2018  Transportation Screening Complete  Medication Review Press photographer) Complete  PCP or Specialist appointment within 3-5 days of discharge Complete  HRI or [place] Complete  SW Recovery Care/Counseling Consult Complete  [place] Not Applicable  Some recent data might be hidden

## 2018-10-06 NOTE — Progress Notes (Signed)
Bedside Swallow Eval performed by Speech Therapy 10/06/2018

## 2018-10-06 NOTE — Progress Notes (Signed)
SLP today

## 2018-10-06 NOTE — Evaluation (Signed)
Clinical/Bedside Swallow Evaluation Patient Details  Name: [name] MRN: [id number] Date of Birth: 1955-08-22  Today's Date: 10/06/2018 Time: SLP Start Time (ACUTE ONLY): 68 SLP Stop Time (ACUTE ONLY): 0725 SLP Time Calculation (min) (ACUTE ONLY): 15 min  Past Medical History:  Past Medical History:  Diagnosis Date  . DEPRESSION   . DIABETES MELLITUS, TYPE I   . DRUG ABUSE    pt should have NO controlled substances rx'ed  . Glaucoma   . HEPATITIS C    chronic  . Heroin overdose ([place]) 10/03/2018  . Heroin use 10/03/2018  . Hypertension 02/18/2011  . 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 Vitiligo    Past Surgical History:  Past Surgical History:  Procedure Laterality Date  . ABDOMINAL AORTOGRAM W/LOWER EXTREMITY N/A 07/25/2016   Procedure: Abdominal Aortogram w/Bilateral Lower Extremity Runoff;  Surgeon: Conrad Willamina, MD;  Location: [facility];  Service: Cardiovascular;  Laterality: N/A;  . ABDOMINAL AORTOGRAM W/LOWER EXTREMITY N/A 12/24/2016   Procedure: ABDOMINAL AORTOGRAM W/LOWER EXTREMITY;  Surgeon: Serafina Mitchell, MD;  Location: [facility];  Service: Cardiovascular;  Laterality: N/A;  . AMPUTATION TOE Right 07/26/2016   Procedure: AMPUTATION GREAT TOE;  Surgeon: Serafina Mitchell, MD;  Location: [facility];  Service: Vascular;  Laterality: Right;  . BIOPSY  08/25/2018   Procedure: BIOPSY;  Surgeon: Irving Copas., MD;  Location: [facility];  Service: Gastroenterology;;  . ESOPHAGOGASTRODUODENOSCOPY N/A 08/25/2018   Procedure: ESOPHAGOGASTRODUODENOSCOPY (EGD);  Surgeon: Irving Copas., MD;  Location: [facility];  Service: Gastroenterology;  Laterality: N/A;  . EYE SURGERY     retinal surgery x 2, right eye  . INSERTION OF ILIAC STENT Right 07/26/2016   Procedure: INSERTION OF RIGHT POPLITEAL STENT WITH BALLOON ANGIOPLASTY;  Surgeon: Serafina Mitchell, MD;  Location: [facility];  Service: Vascular;  Laterality: Right;  . LOWER EXTREMITY  ANGIOGRAM Right 07/26/2016   Procedure: LOWER EXTREMITY ANGIOGRAM;  Surgeon: Serafina Mitchell, MD;  Location: [facility];  Service: Vascular;  Laterality: Right;  . PERIPHERAL VASCULAR BALLOON ANGIOPLASTY Right 07/26/2016   Procedure: PERIPHERAL VASCULAR BALLOON ANGIOPLASTY  RIGHT ANTERIOR TIBEAL ARTERY AND RIGHT SUPERFICIAL FEMORAL ARTERY;  Surgeon: Serafina Mitchell, MD;  Location: [facility];  Service: Vascular;  Laterality: Right;  . PERIPHERAL VASCULAR INTERVENTION Right 12/24/2016   Procedure: PERIPHERAL VASCULAR INTERVENTION;  Surgeon: Serafina Mitchell, MD;  Location: [facility];  Service: Cardiovascular;  Laterality: Right;  lower extr   HPI:  63 yo male adm to [facility] after being found down.  PMH + for illicit drug use,depression, diabetes insulin-dependent, depression, hepatitis C, hypertension, polysubstance abuse.  Swallow evaluation ordered.  Pt admits to coughing/choking on food that caused him to vomit prior to admission. He states he has had this problem since he has been sick. SlP advised him to avoid polysubstances to maximize his health.    Assessment / Plan / Recommendation Clinical Impression  Patient had an MBS 08/2018 and today passed 3 ounce yale water test today.  His swallow ability is likely at baseline from findings during clinical assessment.  He demonstrated initial cough x1 post-thin water swallow but no further episodes.  No focal CN deficits apparent and pt with exhalation post-swallow without oral pocketing nor indication of pharyngeal residuals.  He does demonstrate poor dentition and thus recommend dys3/thin diet.  Concern present for esophageal  component to dysphagia per prior MBS and pt underwent endoscopy in May 2020. Given pt was under the  influence prior to admit, suspect he may have aspirated secretions. SLP Visit Diagnosis: Dysphagia, unspecified (R13.10)    Aspiration Risk  Mild aspiration risk    Diet Recommendation Dysphagia 3 (Mech soft);Thin liquid   Liquid  Administration via: Straw;Cup Medication Administration: Whole meds with puree Supervision: Patient able to self feed Compensations: Small sips/bites;Slow rate Postural Changes: Seated upright at 90 degrees;Remain upright for at least 30 minutes after po intake    Other  Recommendations Oral Care Recommendations: Oral care BID   Follow up Recommendations None      Frequency and Duration            Prognosis        Swallow Study   General Date of Onset: 10/06/18 HPI: 63 yo male adm to [facility] after being found down.  PMH + for illicit drug use,depression, diabetes insulin-dependent, depression, hepatitis C, hypertension, polysubstance abuse.  Swallow evaluation ordered.  Pt admits to coughing/choking on food that caused him to vomit prior to admission. He states he has had this problem since he has been sick. Type of Study: Bedside Swallow Evaluation Diet Prior to this Study: NPO Temperature Spikes Noted: No Respiratory Status: Room air History of Recent Intubation: No Behavior/Cognition: Alert;Cooperative;Pleasant mood Oral Cavity Assessment: Within Functional Limits Oral Care Completed by SLP: No Oral Cavity - Dentition: Other (Comment)(poor dentition) Vision: Functional for self-feeding Self-Feeding Abilities: Able to feed self Patient Positioning: Upright in bed Baseline Vocal Quality: Normal Volitional Cough: Weak Volitional Swallow: Able to elicit    Oral/Motor/Sensory Function Overall Oral Motor/Sensory Function: Within functional limits   Ice Chips Ice chips: Not tested   Thin Liquid Thin Liquid: Within functional limits Presentation: Cup;Straw Other Comments: pt passed 3 ounce yale water test    Nectar Thick Nectar Thick Liquid: Not tested   Honey Thick Honey Thick Liquid: Not tested   Puree Puree: Not tested   Solid     Solid: Within functional limits Presentation: Self Fed;Spoon Other Comments: mixed consistency      [name] 10/06/2018,8:17  AM  Luanna Salk, MS [place] Pager [phone] Office [phone]

## 2018-10-06 NOTE — NC FL2 (Signed)
[place] LEVEL OF CARE SCREENING TOOL     IDENTIFICATION  Patient Name: [name] Birthdate: 06-29-1955 Sex: male Admission Date (Current Location): 10/03/2018  [facility] and [place] Number:  Herbalist and Address:  [facility],  [place] [address] [place]      Provider Number: [id number]  Attending Physician Name and Address:  Elmarie Shiley, MD  Relative Name and Phone Number:       Current Level of Care: Hospital Recommended Level of Care: [place] Prior Approval Number:    Date Approved/Denied:   PASRR Number: [id number]  Discharge Plan: SNF    Current Diagnoses: Patient Active Problem List   Diagnosis Date Noted  . Acute respiratory failure ([place])   . Altered mental status   . Gastroesophageal reflux disease   . Hallucinations   . Hypoglycemia associated with diabetes ([place]) 08/12/2018  . At risk for adverse drug event 02/17/2018  . Abrasions of multiple sites   . Demand ischemia of myocardium ([place]) 02/05/2018  . Acute metabolic encephalopathy 76/73/4193  . DKA (diabetic ketoacidoses) ([place]) 02/03/2018  . Leukocytosis 02/03/2018  . Hypoglycemia 12/24/2017  . Syncope 12/24/2017  . Syncope and collapse 12/23/2017  . IV drug abuse ([place]) 04/19/2017  . Acute encephalopathy 04/19/2017  . Hemangioma 10/02/2016  . Foreign body (FB) in soft tissue   . 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[place])   . Gangrene ([place])   . Type I (juvenile type) diabetes mellitus without mention of complication, not stated as uncontrolled   . Tobacco abuse   . Essential hypertension   . Diabetic infection of right foot ([place]) 07/22/2016  . Diabetic foot infection ([place]) 07/22/2016  . Chronic ulcer of heel, right, with fat layer exposed ([place]) 05/20/2016  . DKA, type 1 ([place]) 04/18/2016  . AKI (acute kidney injury) ([place]) 04/18/2016  . Hyponatremia 04/18/2016  . Severe protein-calorie malnutrition ([place]) 04/18/2016  . Elevated  troponin 04/18/2016  . Poor dentition 07/12/2015  . Tobacco use 07/06/2013  . PAD (peripheral artery disease) ([place]) 04/29/2013  . COPD, mild ([place])   . 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 Diabetic neuropathy ([place]) 06/21/2010  . PROLIFERATIVE DIABETIC RETINOPATHY 06/21/2010  . SKIN TAG 01/30/2010  . Chronic hepatitis C ([place]) 11/22/2008  . VITILIGO 11/22/2008  . PROTEINURIA, MILD 11/22/2008  . Substance abuse ([place]) 04/23/2007  . DEPRESSION 11/11/2006    Orientation RESPIRATION BLADDER Height & Weight     Self, Time, Situation, Place  Normal Indwelling catheter Weight: 136 lb 3.9 oz (61.8 kg) Height:  6' (182.9 cm)  BEHAVIORAL SYMPTOMS/MOOD NEUROLOGICAL BOWEL NUTRITION STATUS      Continent Diet(dysphasia III diet)  AMBULATORY STATUS COMMUNICATION OF NEEDS Skin   Extensive Assist Verbally Normal                       Personal Care Assistance Level of Assistance  Bathing, Feeding, Dressing Bathing Assistance: Maximum assistance Feeding assistance: Independent Dressing Assistance: Maximum assistance     Functional Limitations Info  Sight, Hearing, Speech Sight Info: Adequate Hearing Info: Adequate Speech Info: Adequate    SPECIAL CARE FACTORS FREQUENCY  PT (By licensed PT), OT (By licensed OT)     PT Frequency: 5x OT Frequency: 5x            Contractures Contractures Info: Not present    Additional Factors Info  Code Status, Allergies Code Status Info: full  code Allergies Info: codeine           Current Medications (10/06/2018):  This is the current hospital active medication list Current Facility-Administered Medications  Medication Dose Route Frequency Provider Last Rate Last Dose  . 0.9 %  sodium chloride infusion   Intravenous PRN Elmarie Shiley, MD   Stopped at 10/04/18 7353  . 0.9 %  sodium chloride infusion   Intravenous Continuous Regalado, Belkys A, MD   Stopped at 10/05/18 1833  .  acetaminophen (TYLENOL) tablet 650 mg  650 mg Oral Q6H PRN Hugelmeyer, Alexis, DO   650 mg at 10/04/18 2154   Or  . acetaminophen (TYLENOL) suppository 650 mg  650 mg Rectal Q6H PRN Hugelmeyer, Alexis, DO      . amLODipine (NORVASC) tablet 10 mg  10 mg Oral Daily Hugelmeyer, Alexis, DO   10 mg at 10/06/18 0944  . bisacodyl (DULCOLAX) EC tablet 5 mg  5 mg Oral Daily PRN Hugelmeyer, Alexis, DO      . folic acid (FOLVITE) tablet 1 mg  1 mg Oral Daily Hugelmeyer, Alexis, DO   1 mg at 10/06/18 0944  . hydrALAZINE (APRESOLINE) injection 10 mg  10 mg Intravenous Q6H PRN Regalado, Belkys A, MD   10 mg at 10/06/18 0425  . HYDROcodone-acetaminophen (NORCO/VICODIN) 5-325 MG per tablet 1-2 tablet  1-2 tablet Oral Q6H PRN Vertis Kelch, NP   1 tablet at 10/06/18 0948  . insulin aspart (novoLOG) injection 0-9 Units  0-9 Units Subcutaneous TID WC Regalado, Belkys A, MD   2 Units at 10/06/18 1216  . insulin glargine (LANTUS) injection 15 Units  15 Units Subcutaneous QHS Regalado, Belkys A, MD      . ipratropium-albuterol (DUONEB) 0.5-2.5 (3) MG/3ML nebulizer solution 3 mL  3 mL Nebulization Q6H PRN Hugelmeyer, Alexis, DO      . LORazepam (ATIVAN) injection 0.5 mg  0.5 mg Intravenous Q6H PRN Regalado, Belkys A, MD   0.5 mg at 10/05/18 2229  . magnesium citrate solution 1 Bottle  1 Bottle Oral Once PRN Hugelmeyer, Alexis, DO      . nicotine (NICODERM CQ - dosed in mg/24 hours) patch 21 mg  21 mg Transdermal Daily Hugelmeyer, Alexis, DO   21 mg at 10/06/18 0945  . ondansetron (ZOFRAN) tablet 4 mg  4 mg Oral Q6H PRN Hugelmeyer, Alexis, DO       Or  . ondansetron (ZOFRAN) injection 4 mg  4 mg Intravenous Q6H PRN Hugelmeyer, Alexis, DO      . pantoprazole (PROTONIX) injection 40 mg  40 mg Intravenous Q24H Regalado, Belkys A, MD   40 mg at 10/06/18 0945  . piperacillin-tazobactam (ZOSYN) IVPB 3.375 g  3.375 g Intravenous Q8H BellSharyn Lull T, RPH 12.5 mL/hr at 10/06/18 1318 3.375 g at 10/06/18 1318  .  pregabalin (LYRICA) capsule 50 mg  50 mg Oral BID Hugelmeyer, Alexis, DO   50 mg at 10/06/18 0944  . risperiDONE (RISPERDAL) tablet 0.25 mg  0.25 mg Oral BID Hugelmeyer, Alexis, DO   0.25 mg at 10/06/18 0945  . senna-docusate (Senokot-S) tablet 1 tablet  1 tablet Oral QHS PRN Hugelmeyer, Alexis, DO      . sucralfate (CARAFATE) 1 GM/10ML suspension 1 g  1 g Oral TID WC & HS Hugelmeyer, Alexis, DO   1 g at 10/06/18 1237  . tamsulosin (FLOMAX) capsule 0.4 mg  0.4 mg Oral QPC supper Regalado, Belkys A, MD         Discharge Medications:  Please see discharge summary for a list of discharge medications.  Relevant Imaging Results:  Relevant Lab Results:   Additional Information [id number]  Nila Nephew, LCSW

## 2018-10-06 NOTE — Progress Notes (Signed)
Patient wanted to look in his patient belonging bag.RN brought bag to patient.  Patient took out the wallet and the pack of cigarettes.The RN reminded the patient that there was to be no smoking in the hospital. The RN removed the patient's cigarettes from the room ( witnessed by another RN) and placed them at the desk in a plastic bag with the patient's  label on the outside of the bag. The patient requested privacy in the room so the RN left the room. Bed Alarm is on the bed.
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
## 2018-10-07 DIAGNOSIS — E1142 Type 2 diabetes mellitus with diabetic polyneuropathy: Secondary | ICD-10-CM

## 2018-10-07 DIAGNOSIS — F1910 Other psychoactive substance abuse, uncomplicated: Secondary | ICD-10-CM

## 2018-10-07 DIAGNOSIS — J189 Pneumonia, unspecified organism: Secondary | ICD-10-CM | POA: Diagnosis present

## 2018-10-07 DIAGNOSIS — N179 Acute kidney failure, unspecified: Secondary | ICD-10-CM

## 2018-10-07 DIAGNOSIS — J181 Lobar pneumonia, unspecified organism: Secondary | ICD-10-CM

## 2018-10-07 DIAGNOSIS — R55 Syncope and collapse: Secondary | ICD-10-CM

## 2018-10-07 DIAGNOSIS — G9341 Metabolic encephalopathy: Secondary | ICD-10-CM

## 2018-10-07 LAB — IRON AND TIBC
Iron: 19 ug/dL — ABNORMAL LOW (ref 45–182)
Saturation Ratios: 8 % — ABNORMAL LOW (ref 17.9–39.5)
TIBC: 239 ug/dL — ABNORMAL LOW (ref 250–450)
UIBC: 220 ug/dL

## 2018-10-07 LAB — CBC
HCT: 28.5 % — ABNORMAL LOW (ref 39.0–52.0)
Hemoglobin: 8.9 g/dL — ABNORMAL LOW (ref 13.0–17.0)
MCH: 27.7 pg (ref 26.0–34.0)
MCHC: 31.2 g/dL (ref 30.0–36.0)
MCV: 88.8 fL (ref 80.0–100.0)
Platelets: 255 10*3/uL (ref 150–400)
RBC: 3.21 MIL/uL — ABNORMAL LOW (ref 4.22–5.81)
RDW: 14.3 % (ref 11.5–15.5)
WBC: 5.7 10*3/uL (ref 4.0–10.5)
nRBC: 0 % (ref 0.0–0.2)

## 2018-10-07 LAB — GLUCOSE, CAPILLARY
Glucose-Capillary: 344 mg/dL — ABNORMAL HIGH (ref 70–99)
Glucose-Capillary: 333 mg/dL — ABNORMAL HIGH (ref 70–99)
Glucose-Capillary: 378 mg/dL — ABNORMAL HIGH (ref 70–99)
Glucose-Capillary: 339 mg/dL — ABNORMAL HIGH (ref 70–99)

## 2018-10-07 LAB — MAGNESIUM: Magnesium: 2 mg/dL (ref 1.7–2.4)

## 2018-10-07 LAB — BASIC METABOLIC PANEL
Anion gap: 7 (ref 5–15)
BUN: 18 mg/dL (ref 8–23)
CO2: 22 mmol/L (ref 22–32)
Calcium: 8.3 mg/dL — ABNORMAL LOW (ref 8.9–10.3)
Chloride: 106 mmol/L (ref 98–111)
Creatinine, Ser: 1.3 mg/dL — ABNORMAL HIGH (ref 0.61–1.24)
GFR calc Af Amer: >60 mL/min (ref >60)
GFR calc non Af Amer: 58 mL/min — ABNORMAL LOW (ref >60)
Glucose, Bld: 376 mg/dL — ABNORMAL HIGH (ref 70–99)
Potassium: 4.2 mmol/L (ref 3.5–5.1)
Sodium: 135 mmol/L (ref 135–145)

## 2018-10-07 LAB — RETICULOCYTES
Immature Retic Fract: 10.9 % (ref 2.3–15.9)
RBC.: 3.21 MIL/uL — ABNORMAL LOW (ref 4.22–5.81)
Retic Count, Absolute: 21.2 10*3/uL (ref 19.0–186.0)
Retic Ct Pct: 0.7 % (ref 0.4–3.1)

## 2018-10-07 LAB — FOLATE: Folate: 11.5 ng/mL (ref >5.9)

## 2018-10-07 LAB — FERRITIN: Ferritin: 68 ng/mL (ref 24–336)

## 2018-10-07 LAB — VITAMIN B12: Vitamin B-12: 409 pg/mL (ref 180–914)

## 2018-10-07 MED ORDER — TRAMADOL HCL 50 MG PO TABS
50.0000 mg | ORAL_TABLET | Freq: Four times a day (QID) | ORAL | Status: DC | PRN
  Administered 2018-10-07 – 2018-10-11 (×14): 50 mg via ORAL
  Filled 2018-10-07 (×14): qty 1

## 2018-10-07 MED ORDER — INSULIN GLARGINE 100 UNIT/ML ~~LOC~~ SOLN
18.0000 [IU] | Freq: Every day | SUBCUTANEOUS | Status: DC
  Administered 2018-10-07 – 2018-10-10 (×4): 18 [IU] via SUBCUTANEOUS
  Filled 2018-10-07 (×6): qty 0.18

## 2018-10-07 MED ORDER — AMOXICILLIN-POT CLAVULANATE 875-125 MG PO TABS
1.0000 | ORAL_TABLET | Freq: Two times a day (BID) | ORAL | Status: DC
  Administered 2018-10-07 – 2018-10-09 (×6): 1 via ORAL
  Filled 2018-10-07 (×6): qty 1

## 2018-10-07 MED ORDER — PANTOPRAZOLE SODIUM 40 MG PO TBEC
40.0000 mg | DELAYED_RELEASE_TABLET | Freq: Every day | ORAL | Status: DC
  Administered 2018-10-08 – 2018-10-11 (×4): 40 mg via ORAL
  Filled 2018-10-07 (×4): qty 1

## 2018-10-07 MED ORDER — INSULIN ASPART 100 UNIT/ML ~~LOC~~ SOLN
4.0000 [IU] | Freq: Three times a day (TID) | SUBCUTANEOUS | Status: DC
  Administered 2018-10-08 – 2018-10-10 (×6): 4 [IU] via SUBCUTANEOUS

## 2018-10-07 NOTE — Progress Notes (Signed)
Physical Therapy Treatment Patient Details Name: [name] MRN: [id number] DOB: Aug 25, 1955 Today's Date: 10/07/2018    History of Present Illness 63 y.o. male admitted on 10/03/18 for AMS and hypoglycemia. Pt with other significant PMH of diabetic retinopathy, HTN, DM 1, peripheral vascular balloon angio, R toe amputation, and polysubstance abuse    PT Comments    Pt motivated to participate today.  Reports LE pain scale 10/10 neuropathy pain that was monitored through session.  Pt with mod I, increased time and required verbal cueing and use of handrails to assist with supine to sit.  Cueing for handplacement for sit to stand safely.  Pt LE weak with visible musculature fatigue during gait.  Tech pushed chair behind for safety.  Used RW for UE support to assist with balance during gait, minimal LOB episodes that pt was able to correct with min A.  EOS pt left sitting on EOB.  Bed alarm set, call bell within reach and RN aware of status.    Follow Up Recommendations  SNF     Equipment Recommendations  Rolling walker with 5" wheels    Recommendations for Other Services       Precautions / Restrictions Precautions Precautions: Fall Restrictions Weight Bearing Restrictions: No    Mobility  Bed Mobility Overal bed mobility: Modified Independent       Supine to sit: Min guard     General bed mobility comments: Mod I, increased time  Transfers Overall transfer level: Needs assistance Equipment used: Rolling walker (2 wheeled) Transfers: Sit to/from Stand Sit to Stand: Min assist         General transfer comment: Cueing for hand placement to assist with sit to stand, Stable upon standing with RW  Ambulation/Gait Ambulation/Gait assistance: Min assist;+2 safety/equipment Gait Distance (Feet): 200 Feet Assistive device: Rolling walker (2 wheeled) Gait Pattern/deviations: Decreased stride length;Trunk flexed Gait velocity: slow   General Gait Details: No LOB; LE  visible fatigue.  Chair pushed behind for safety   Stairs             Wheelchair Mobility    Modified Rankin (Stroke Patients Only)       Balance                                            Cognition Arousal/Alertness: Awake/alert Behavior During Therapy: WFL for tasks assessed/performed Overall Cognitive Status: Within Functional Limits for tasks assessed                                        Exercises      General Comments        Pertinent Vitals/Pain Pain Assessment: 0-10 Pain Score: 10-Worst pain ever Pain Location: 10/10 neuropathy BLE Pain Descriptors / Indicators: Burning;Nagging Pain Intervention(s): Monitored during session    Home Living                      Prior Function            PT Goals (current goals can now be found in the care plan section)      Frequency    Min 2X/week      PT Plan      Co-evaluation  AM-PAC PT "6 Clicks" Mobility   Outcome Measure  Help needed turning from your back to your side while in a flat bed without using bedrails?: A Little Help needed moving from lying on your back to sitting on the side of a flat bed without using bedrails?: A Little Help needed moving to and from a bed to a chair (including a wheelchair)?: A Little Help needed standing up from a chair using your arms (e.g., wheelchair or bedside chair)?: A Little Help needed to walk in hospital room?: A Little Help needed climbing 3-5 steps with a railing? : Total 6 Click Score: 16    End of Session Equipment Utilized During Treatment: Gait belt Activity Tolerance: Patient tolerated treatment well;Patient limited by fatigue Patient left: in bed;with bed alarm set;with call bell/phone within reach(RN aware of status) Nurse Communication: Mobility status PT Visit Diagnosis: Other abnormalities of gait and mobility (R26.89);Muscle weakness (generalized) (M62.81)     Time:  4503-8882 PT Time Calculation (min) (ACUTE ONLY): 29 min  Charges:  $Gait Training: 8-22 mins $Therapeutic Activity: 8-22 mins                     [address], LPTA; Anton  Aldona Lento 10/07/2018, 4:25 PM

## 2018-10-07 NOTE — Progress Notes (Signed)
Inpatient Diabetes Program Recommendations  AACE/ADA: New Consensus Statement on Inpatient Glycemic Control (2015)  Target Ranges:  Prepandial:   less than 140 mg/dL      Peak postprandial:   less than 180 mg/dL (1-2 hours)      Critically ill patients:  140 - 180 mg/dL   Lab Results  Component Value Date   GLUCAP 344 (H) 10/07/2018   HGBA1C 9.8 (H) 08/13/2018    Review of Glycemic Control  Diabetes history: DM2 Outpatient Diabetes medications: 70/30 15 units in am and 10 units QPM Current orders for Inpatient glycemic control: Lantus 18 units QHS, Novolog 0-9 units tidwc  HgbA1C - 9.8% - uncontrolled. Needs adjustment to home diabetes meds. Post-prandials elevated. Lantus increased with FBS > goal of 180 mg/dL.  Inpatient Diabetes Program Recommendations:     Add Novolog 4 units tidwc for meal coverage insulin.  Continue to follow.  Thank you. Lorenda Peck, RD, LDN, CDE Inpatient Diabetes Coordinator [phone]

## 2018-10-07 NOTE — Progress Notes (Signed)
[name]  [name]:[id number] DOB: May 28, 1955 DOA: 10/03/2018 PCP: Patient, No Pcp Per  Assessment/Plan: 1. Acute metabolic encephalopathy, improving.  Multifactorial etiology: Heroin overdose , hypoglycemia, pneumonia.  Continue antibiotics, close CBG monitoring, polysubstance abuse cessation/counseling provided 2. Unwitnessed syncope.  Likely related to admitted heroin use/overdose in addition to hypoglycemia.  3. Hypoglycemia, resolved.  Nadir glucose of 35.  No longer requiring D5 4. Left lower lobe pneumonia, stable on room air, likely aspiration pneumonia related to syncopal event.  Position from IV Zosyn to Augmentin 5. AKI, mild. Baseline cr 0.9-1.1 currently 1.3. continue IVF, repeat inam, monitor output, check ua if no improvement, 6. 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  A1c greater than 9, fasting blood sugars in 300s.  Increase Lantus to 18 units nightly (takes 70/30 at home), continue sliding scale, add NovoLog 4 units 3 times daily for meal coverage,lyrica. 7. Chronic normocytic anemia. hgb stable at 9, no blood loss 8. Polysubstance abuse. Amenable to cessation. Hopeful to go to rehab 9. Urinary retention. Has foley, voiding trial on 6/25 if creatinine improved. Started on flomax here 10. Anxiety/mood. Continue risperdal, really wants to go to SNF.  Code Status: Full code Family Communication: No family at bedside (indicate person spoken with, relationship, and if by phone, the number) Disposition Plan: Monitor for fever and continued improvement in clinical status on oral antibiotics, blood glucose control, PT recommends skilled nursing facility, social worker aware   Consultants:  None  Procedures:  None  Antibiotics:  Augmentin, IV Zosyn (indicate start date, and stop date if known)  HPI/Subjective:  NAHOME [name] is a 63 y.o. year old male with medical history significant for diabetes  insulin-dependent, depression, hepatitis C, hypertension, polysubstance abuse with overdose  who presented on 10/03/2018 with altered mental status after being found down lying on his face by family surrounded by drug paraphernalia. He was required narcan and dextrose by EMS due to profound hypoglycemia.  This am feels breathing stable Doing ok in diet Still having nerve pain in legs/fee (typical)  Objective: Vitals:   10/07/18 1600 10/07/18 2046  BP: (!) 141/78 132/66  Pulse: 75 74  Resp: 16 20  Temp: 98.2 F (36.8 C) 99.2 F (37.3 C)  SpO2: 96% 97%    Intake/Output Summary (Last 24 hours) at 10/07/2018 2321 Last data filed at 10/07/2018 2141 Gross per 24 hour  Intake 2719.95 ml  Output 3400 ml  Net -680.05 ml   Filed Weights   10/03/18 1855 10/03/18 1859 10/04/18 0129  Weight: 72.6 kg 72.5 kg 61.8 kg    Exam:   General:  Elderly male, no distress,   Cardiovascular: RRR, no edema  Respiratory: normal effort on room air, clear breath sounds  Abdomen: soft, non -tender, normal bowel sounds  Musculoskeletal: normal ROM   Skin dry, intact  Neurologic alert, oriented x 4  Psych: anxious  Data Reviewed: Basic Metabolic Panel: Recent Labs  Lab 10/03/18 1904 10/04/18 0410 10/05/18 0421 10/05/18 0811 10/06/18 0541 10/07/18 0443  NA 133* 133*  --  134* 135 135  K 4.5 4.3  --  3.3* 3.4* 4.2  CL 99 100  --  100 104 106  CO2 26 24  --  25 21* 22  GLUCOSE 82 179*  --  144* 254* 376*  BUN 30* 25*  --  15 16 18   CREATININE 1.13 1.17 1.16 1.13 1.20 1.30*  CALCIUM 8.8* 8.2*  --  8.7* 8.2* 8.3*  MG  --  1.9  --   --   --  2.0  PHOS  --  3.0  --   --   --   --    Liver Function Tests: Recent Labs  Lab 10/03/18 1904  AST 30  ALT 31  ALKPHOS 108  BILITOT 0.4  PROT 6.8  ALBUMIN 2.9*   No results for input(s): LIPASE, AMYLASE in the last 168 hours. No results for input(s): AMMONIA in the last 168 hours. CBC: Recent Labs  Lab 10/03/18 1904 10/04/18 0410  10/05/18 0811 10/06/18 0541 10/07/18 0443  WBC 6.8 6.1 6.6 5.7 5.7  HGB 9.4* 8.4* 10.0* 9.0* 8.9*  HCT 30.4* 26.2* 31.2* 27.5* 28.5*  MCV 89.9 87.3 86.9 86.5 88.8  PLT 326 271 290 255 255   Cardiac Enzymes: No results for input(s): CKTOTAL, CKMB, CKMBINDEX, TROPONINI in the last 168 hours. BNP (last 3 results) No results for input(s): BNP in the last 8760 hours.  ProBNP (last 3 results) No results for input(s): PROBNP in the last 8760 hours.  CBG: Recent Labs  Lab 10/06/18 2020 10/07/18 0750 10/07/18 1154 10/07/18 1724 10/07/18 2041  GLUCAP 180* 344* 333* 378* 339*    Recent Results (from the past 240 hour(s))  SARS Coronavirus 2 (CEPHEID- Performed in [facility] hospital lab), Hosp Order     Status: None   Collection Time: 10/03/18  7:50 PM   Specimen: Nasopharyngeal Swab  Result Value Ref Range Status   SARS Coronavirus 2 NEGATIVE NEGATIVE Final    Comment: ([name]) If result is NEGATIVE SARS-CoV-2 target nucleic acids are NOT DETECTED. The SARS-CoV-2 RNA is generally detectable in upper and lower  respiratory specimens during the acute phase of infection. The lowest  concentration of SARS-CoV-2 viral copies this assay can detect is 250  copies / mL. A negative result does not preclude SARS-CoV-2 infection  and should not be used as the sole basis for treatment or other  patient management decisions.  A negative result may occur with  improper specimen collection / handling, submission of specimen other  than nasopharyngeal swab, presence of viral mutation(s) within the  areas targeted by this assay, and inadequate number of viral copies  (<250 copies / mL). A negative result must be combined with clinical  observations, patient history, and epidemiological information. If result is POSITIVE SARS-CoV-2 target nucleic acids are DETECTED. The SARS-CoV-2 RNA is generally detectable in upper and lower  respiratory specimens dur ing the acute phase of infection.   Positive  results are indicative of active infection with SARS-CoV-2.  Clinical  correlation with patient history and other diagnostic information is  necessary to determine patient infection status.  Positive results do  not rule out bacterial infection or co-infection with other viruses. If result is PRESUMPTIVE POSTIVE SARS-CoV-2 nucleic acids MAY BE PRESENT.   A presumptive positive result was obtained on the submitted specimen  and confirmed on repeat testing.  While 2019 novel coronavirus  (SARS-CoV-2) nucleic acids may be present in the submitted sample  additional confirmatory testing may be necessary for epidemiological  and / or clinical management purposes  to differentiate between  SARS-CoV-2 and other Sarbecovirus currently known to infect humans.  If clinically indicated additional testing with an alternate test  methodology [phone]) is advised. The SARS-CoV-2 RNA is generally  detectable in upper and lower respiratory sp ecimens during the acute  phase of infection. The expected result is Negative. Fact Sheet for Patients:  StrictlyIdeas.no Fact Sheet for Healthcare Providers: BankingDealers.co.za This  test is not yet approved or cleared by the [place] and has been authorized for detection and/or diagnosis of SARS-CoV-2 by FDA under an Emergency Use Authorization (EUA).  This EUA will remain in effect (meaning this test can be used) for the duration of the COVID-19 declaration under Section 564(b)(1) of the Act, 21 U.S.C. section 360bbb-3(b)(1), unless the authorization is terminated or revoked sooner. Performed at [facility], [place] [address], [place], [place] [address]   Culture, blood (routine x 2)     Status: None (Preliminary result)   Collection Time: 10/03/18  8:04 PM   Specimen: BLOOD LEFT HAND  Result Value Ref Range Status   Specimen Description   Final    BLOOD LEFT  HAND Performed at [facility] [address], [place], [place] [address]    Special Requests   Final    BOTTLES DRAWN AEROBIC AND ANAEROBIC Blood Culture adequate volume Performed at [facility] [address], [place], [place] [address]    Culture   Final    NO GROWTH 3 DAYS Performed at [facility], [place] [address] [place], [place] [address]    Report Status PENDING  Incomplete  Culture, blood (routine x 2)     Status: None (Preliminary result)   Collection Time: 10/03/18  8:05 PM   Specimen: Right Antecubital; Blood  Result Value Ref Range Status   Specimen Description   Final    RIGHT ANTECUBITAL Performed at [facility] [address], [place], [place] [address]    Special Requests   Final    BOTTLES DRAWN AEROBIC AND ANAEROBIC Blood Culture adequate volume Performed at [facility] [address] [place], [place] [address]    Culture   Final    NO GROWTH 3 DAYS Performed at [facility], [place] [address], [place], [place] [address]    Report Status PENDING  Incomplete  MRSA PCR Screening     Status: None   Collection Time: 10/04/18  1:50 AM   Specimen: Nasal Mucosa; Nasopharyngeal  Result Value Ref Range Status   MRSA by PCR NEGATIVE NEGATIVE Final    Comment:        The GeneXpert MRSA Assay (FDA approved for NASAL specimens only), is one component of a comprehensive MRSA colonization surveillance program. It is not intended to diagnose MRSA infection nor to guide or monitor treatment for MRSA infections. Performed at [facility], [place] [address] [place],  [address]      Studies: No results found.  Scheduled Meds: . amLODipine  10 mg Oral Daily  . amoxicillin-clavulanate  1 tablet Oral Q12H  . folic acid  1 mg Oral Daily  . insulin aspart  0-9 Units Subcutaneous TID WC  . insulin glargine  18 Units Subcutaneous QHS  . nicotine  21 mg  Transdermal Daily  . [START ON 10/08/2018] pantoprazole  40 mg Oral Daily  . pregabalin  50 mg Oral BID  . risperiDONE  0.25 mg Oral BID  . sucralfate  1 g Oral TID WC & HS  . tamsulosin  0.4 mg Oral QPC supper   Continuous Infusions: . sodium chloride Stopped (10/04/18 3903)  . sodium chloride 100 mL/hr at 10/07/18 2040    Active Problems:   Hypoglycemia      Desiree Hane  [name]

## 2018-10-07 NOTE — Progress Notes (Signed)
Pharmacy Antibiotic Note  [name] is a 63 y.o. male admitted on 10/03/2018 with pneumonia, found down laying in driveway.  Pharmacy was consulted for zosyn and vancomycin dosing.  Plan: Day 4 full abx for aspiration PNA Zosyn >> Augmentin 6/24 Vancomycin d/c 6/23, SCr cont to increase slowly  Height: 6' (182.9 cm) Weight: 136 lb 3.9 oz (61.8 kg) IBW/kg (Calculated) : 77.6  Temp (24hrs), Avg:98.3 F (36.8 C), Min:98 F (36.7 C), Max:98.8 F (37.1 C)  Recent Labs  Lab 10/03/18 1904 10/03/18 1950 10/04/18 0410 10/05/18 0421 10/05/18 0811 10/06/18 0541 10/07/18 0443  WBC 6.8  --  6.1  --  6.6 5.7 5.7  CREATININE 1.13  --  1.17 1.16 1.13 1.20 1.30*  LATICACIDVEN  --  1.1  --   --   --   --   --     Estimated Creatinine Clearance: 51.5 mL/min (A) (by C-G formula based on SCr of 1.3 mg/dL (H)).    Allergies  Allergen Reactions  . Codeine Itching    Tolerates hydrocodone/apap   Antimicrobials this admission: 6/20 CTX x 1 6/21 Vanc>> 6/23 6/21 zosyn >>  Dose adjustments this admission:  Microbiology results: 6/20 BCx: ngtd 6/20 COVID 19 neg 6/21 MRSA PCR: neg  6/6 Covid: neg  Thank you for allowing pharmacy to be a part of this patient's care.   Minda Ditto PharmD 10/07/2018 10:04 AM

## 2018-10-07 NOTE — Plan of Care (Signed)

## 2018-10-08 DIAGNOSIS — E11649 Type 2 diabetes mellitus with hypoglycemia without coma: Secondary | ICD-10-CM

## 2018-10-08 LAB — CBC
HCT: 28.5 % — ABNORMAL LOW (ref 39.0–52.0)
Hemoglobin: 8.8 g/dL — ABNORMAL LOW (ref 13.0–17.0)
MCH: 27.3 pg (ref 26.0–34.0)
MCHC: 30.9 g/dL (ref 30.0–36.0)
MCV: 88.5 fL (ref 80.0–100.0)
Platelets: 293 10*3/uL (ref 150–400)
RBC: 3.22 MIL/uL — ABNORMAL LOW (ref 4.22–5.81)
RDW: 13.9 % (ref 11.5–15.5)
WBC: 6 10*3/uL (ref 4.0–10.5)
nRBC: 0 % (ref 0.0–0.2)

## 2018-10-08 LAB — BASIC METABOLIC PANEL
Anion gap: 6 (ref 5–15)
BUN: 17 mg/dL (ref 8–23)
CO2: 23 mmol/L (ref 22–32)
Calcium: 8.4 mg/dL — ABNORMAL LOW (ref 8.9–10.3)
Chloride: 104 mmol/L (ref 98–111)
Creatinine, Ser: 0.97 mg/dL (ref 0.61–1.24)
GFR calc Af Amer: >60 mL/min (ref >60)
GFR calc non Af Amer: >60 mL/min (ref >60)
Glucose, Bld: 250 mg/dL — ABNORMAL HIGH (ref 70–99)
Potassium: 3.9 mmol/L (ref 3.5–5.1)
Sodium: 133 mmol/L — ABNORMAL LOW (ref 135–145)

## 2018-10-08 LAB — GLUCOSE, CAPILLARY
Glucose-Capillary: 256 mg/dL — ABNORMAL HIGH (ref 70–99)
Glucose-Capillary: 156 mg/dL — ABNORMAL HIGH (ref 70–99)
Glucose-Capillary: 119 mg/dL — ABNORMAL HIGH (ref 70–99)
Glucose-Capillary: 30 mg/dL — CL (ref 70–99)
Glucose-Capillary: 102 mg/dL — ABNORMAL HIGH (ref 70–99)

## 2018-10-08 MED ORDER — HYDROCODONE-ACETAMINOPHEN 5-325 MG PO TABS
1.0000 | ORAL_TABLET | Freq: Four times a day (QID) | ORAL | Status: DC | PRN
  Administered 2018-10-08 – 2018-10-11 (×11): 2 via ORAL
  Filled 2018-10-08 (×11): qty 2

## 2018-10-08 MED ORDER — DEXTROSE 50 % IV SOLN
INTRAVENOUS | Status: AC
  Administered 2018-10-08: 50 mL
  Filled 2018-10-08: qty 50

## 2018-10-08 MED ORDER — HYDROCODONE-ACETAMINOPHEN 5-325 MG PO TABS: 1.0000 | ORAL_TABLET | Freq: Four times a day (QID) | ORAL | Status: DC | PRN

## 2018-10-08 MED ORDER — BACITRACIN ZINC 500 UNIT/GM EX OINT
TOPICAL_OINTMENT | Freq: Two times a day (BID) | CUTANEOUS | Status: DC
  Administered 2018-10-08 (×2): 1 via TOPICAL
  Administered 2018-10-09: 22:00:00 via TOPICAL
  Administered 2018-10-09 – 2018-10-11 (×4): 1 via TOPICAL
  Filled 2018-10-08 (×6): qty 0.9

## 2018-10-08 NOTE — Progress Notes (Signed)
Hypoglycemic Event  CBG: 30  Treatment: D50 50 mL (25 gm)  Symptoms: Pale and Sweaty  Follow-up CBG: Time:1728 CBG Result:119  Possible Reasons for Event: Inadequate meal intake  Comments/MD notified:Dr Nettey notified    [name]

## 2018-10-08 NOTE — Progress Notes (Signed)
[name] PROGRESS NOTE  [name] EPP:[id number] DOB: June 13, 1955 DOA: 10/03/2018 PCP: Patient, No Pcp Per  Assessment/Plan: 1. Acute metabolic encephalopathy, improving.  Multifactorial etiology: fentanyl overdose , hypoglycemia, pneumonia.  Continue antibiotics, close CBG monitoring, polysubstance abuse cessation/counseling provided 2. Unwitnessed syncope.  Likely related to admitted fentanyl use/overdose in addition to hypoglycemia. 3. Hypoglycemia, resolved.  Nadir glucose of 35 on admission. Recurred this afternoon, eating well so unclear why it happened, responded to correction. Close monitoring of short acting insulin 4. Left lower lobe pneumonia, stable on room air, likely aspiration pneumonia related to syncopal event.  continue Augmentin 5. AKI, mild. Back to baseline from peak of 1.3 after timed IVF. Baseline cr 0.9-1.1 currently 1.3.  monitor output,  6. Dysphagia. MBS concerning for esophageal component. EGD in 5/20 for evaluation of melena showed esophageal ulcers, PPI, carafate, aspiration precautions, dysphagia 3 diet, monitor 7. 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  A1c greater than 9, fasting blood sugars in 300s but had recurrent hypoglycemic episode this afternoon to 30. [name]  Keep Lantus at 18 units nightly (takes 70/30 at home), continue sliding scale, add NovoLog 4 units 3 times daily for meal coverage,lyrica. 8. Chronic normocytic anemia. hgb stable at 9, no blood loss 9. Polysubstance abuse. Amenable to cessation. Hopeful to go to rehab 10. PAD. History of PAD s/p RIight toe gangrene s/p amputation 2018. Persistent leg pain concerned for claudication, good pulses on exam, ABI to check 11. Posterior scalp wound. Says occurred after prior fall. No signs of infection, wound consulted-bactracin, no dressings due to area 12. Urinary retention. Has foley, voiding trial on 6/26 given creatinine improved. Started on flomax  here 13. Anxiety/mood. Continue risperdal, really wants to go to SNF.  Code Status: Full code Family Communication: No family at bedside (indicate person spoken with, relationship, and if by phone, the number) Disposition Plan: Monitor for fever and continued improvement in clinical status on oral antibiotics, blood glucose control, PT recommends skilled nursing facility, social worker aware   Consultants:  None  Procedures:  None  Antibiotics:  Augmentin, IV Zosyn (indicate start date, and stop date if known)  HPI/Subjective:  [name] is a 63 y.o. year old male with medical history significant for diabetes insulin-dependent, depression, hepatitis C, hypertension, polysubstance abuse with overdose  who presented on 10/03/2018 with altered mental status after being found down lying on his face by family surrounded by drug paraphernalia. He was required narcan and dextrose by EMS due to profound hypoglycemia.  Had choking episode while eating too fast Mild cough Worried about leg pain  Objective: Vitals:   10/08/18 0418 10/08/18 1500  BP: 139/65 135/62  Pulse: 68 70  Resp: 18 16  Temp: 98.3 F (36.8 C) 98.7 F (37.1 C)  SpO2: 96% 96%    Intake/Output Summary (Last 24 hours) at 10/08/2018 1951 Last data filed at 10/08/2018 1900 Gross per 24 hour  Intake 1740 ml  Output 1200 ml  Net 540 ml   Filed Weights   10/03/18 1855 10/03/18 1859 10/04/18 0129  Weight: 72.6 kg 72.5 kg 61.8 kg    Exam:   General:  Elderly male, no distress,   Cardiovascular: RRR, no edema  Respiratory: normal effort on room air, clear breath sounds  Abdomen: soft, non -tender, normal bowel sounds  Musculoskeletal: normal ROM   Skin dry, intact, R big toe amputated  Neurologic alert, oriented x 4  Psych: anxious  Data Reviewed: Basic Metabolic Panel: Recent Labs  Lab 10/04/18 0410 10/05/18 0421 10/05/18 0811 10/06/18 0541 10/07/18 0443 10/08/18 0451  NA 133*  --  134* 135  135 133*  K 4.3  --  3.3* 3.4* 4.2 3.9  CL 100  --  100 104 106 104  CO2 24  --  25 21* 22 23  GLUCOSE 179*  --  144* 254* 376* 250*  BUN 25*  --  15 16 18 17   CREATININE 1.17 1.16 1.13 1.20 1.30* 0.97  CALCIUM 8.2*  --  8.7* 8.2* 8.3* 8.4*  MG 1.9  --   --   --  2.0  --   PHOS 3.0  --   --   --   --   --    Liver Function Tests: Recent Labs  Lab 10/03/18 1904  AST 30  ALT 31  ALKPHOS 108  BILITOT 0.4  PROT 6.8  ALBUMIN 2.9*   No results for input(s): LIPASE, AMYLASE in the last 168 hours. No results for input(s): AMMONIA in the last 168 hours. CBC: Recent Labs  Lab 10/04/18 0410 10/05/18 0811 10/06/18 0541 10/07/18 0443 10/08/18 0451  WBC 6.1 6.6 5.7 5.7 6.0  HGB 8.4* 10.0* 9.0* 8.9* 8.8*  HCT 26.2* 31.2* 27.5* 28.5* 28.5*  MCV 87.3 86.9 86.5 88.8 88.5  PLT 271 290 255 255 293   Cardiac Enzymes: No results for input(s): CKTOTAL, CKMB, CKMBINDEX, TROPONINI in the last 168 hours. BNP (last 3 results) No results for input(s): BNP in the last 8760 hours.  ProBNP (last 3 results) No results for input(s): PROBNP in the last 8760 hours.  CBG: Recent Labs  Lab 10/07/18 2041 10/08/18 0813 10/08/18 1139 10/08/18 1653 10/08/18 1728  GLUCAP 339* 256* 156* 30* 119*    Recent Results (from the past 240 hour(s))  SARS Coronavirus 2 (CEPHEID- Performed in [facility] hospital lab), Hosp Order     Status: None   Collection Time: 10/03/18  7:50 PM   Specimen: Nasopharyngeal Swab  Result Value Ref Range Status   SARS Coronavirus 2 NEGATIVE NEGATIVE Final    Comment: (NOTE) If result is NEGATIVE SARS-CoV-2 target nucleic acids are NOT DETECTED. The SARS-CoV-2 RNA is generally detectable in upper and lower  respiratory specimens during the acute phase of infection. The lowest  concentration of SARS-CoV-2 viral copies this assay can detect is 250  copies / mL. A negative result does not preclude SARS-CoV-2 infection  and should not be used as the sole basis for  treatment or other  patient management decisions.  A negative result may occur with  improper specimen collection / handling, submission of specimen other  than nasopharyngeal swab, presence of viral mutation(s) within the  areas targeted by this assay, and inadequate number of viral copies  (<250 copies / mL). A negative result must be combined with clinical  observations, patient history, and epidemiological information. If result is POSITIVE SARS-CoV-2 target nucleic acids are DETECTED. The SARS-CoV-2 RNA is generally detectable in upper and lower  respiratory specimens dur ing the acute phase of infection.  Positive  results are indicative of active infection with SARS-CoV-2.  Clinical  correlation with patient history and other diagnostic information is  necessary to determine patient infection status.  Positive results do  not rule out bacterial infection or co-infection with other viruses. If result is PRESUMPTIVE POSTIVE SARS-CoV-2 nucleic acids MAY BE PRESENT.   A presumptive positive result was obtained on the submitted specimen  and confirmed on repeat testing.  While 2019 novel  coronavirus  (SARS-CoV-2) nucleic acids may be present in the submitted sample  additional confirmatory testing may be necessary for epidemiological  and / or clinical management purposes  to differentiate between  SARS-CoV-2 and other Sarbecovirus currently known to infect humans.  If clinically indicated additional testing with an alternate test  methodology [phone]) is advised. The SARS-CoV-2 RNA is generally  detectable in upper and lower respiratory sp ecimens during the acute  phase of infection. The expected result is Negative. Fact Sheet for Patients:  StrictlyIdeas.no Fact Sheet for Healthcare Providers: BankingDealers.co.za This test is not yet approved or cleared by the [place] FDA and has been authorized for detection and/or  diagnosis of SARS-CoV-2 by FDA under an Emergency Use Authorization (EUA).  This EUA will remain in effect (meaning this test can be used) for the duration of the COVID-19 declaration under Section 564(b)(1) of the Act, 21 U.S.C. section 360bbb-3(b)(1), unless the authorization is terminated or revoked sooner. Performed at [facility], [place] [address] [place], [place] [address]   Culture, blood (routine x 2)     Status: None (Preliminary result)   Collection Time: 10/03/18  8:04 PM   Specimen: BLOOD LEFT HAND  Result Value Ref Range Status   Specimen Description   Final    BLOOD LEFT HAND Performed at [facility] [address], [place], [place] [address]    Special Requests   Final    BOTTLES DRAWN AEROBIC AND ANAEROBIC Blood Culture adequate volume Performed at [facility] [address], [place], [place] [address]    Culture   Final    NO GROWTH 4 DAYS Performed at [facility], [place] [address], [place], [place] [address]    Report Status PENDING  Incomplete  Culture, blood (routine x 2)     Status: None (Preliminary result)   Collection Time: 10/03/18  8:05 PM   Specimen: Right Antecubital; Blood  Result Value Ref Range Status   Specimen Description   Final    RIGHT ANTECUBITAL Performed at [facility] [address], [place], [place] [address]    Special Requests   Final    BOTTLES DRAWN AEROBIC AND ANAEROBIC Blood Culture adequate volume Performed at [facility] [address] [place], [place] [address]    Culture   Final    NO GROWTH 4 DAYS Performed at [facility], [place] [address] [place], [place] [address]    Report Status PENDING  Incomplete  MRSA PCR Screening     Status: None   Collection Time: 10/04/18  1:50 AM   Specimen: Nasal Mucosa; Nasopharyngeal  Result Value Ref Range Status   MRSA by PCR NEGATIVE NEGATIVE Final    Comment:         The GeneXpert MRSA Assay (FDA approved for NASAL specimens only), is one component of a comprehensive MRSA colonization surveillance program. It is not intended to diagnose MRSA infection nor to guide or monitor treatment for MRSA infections. Performed at [facility], [place] [address] [place], [place] [address]      Studies: No results found.  Scheduled Meds: . amLODipine  10 mg Oral Daily  . amoxicillin-clavulanate  1 tablet Oral Q12H  . bacitracin   Topical BID  . folic acid  1 mg Oral Daily  . insulin aspart  0-9 Units Subcutaneous TID WC  . insulin aspart  4 Units Subcutaneous TID WC  . insulin  glargine  18 Units Subcutaneous QHS  . nicotine  21 mg Transdermal Daily  . pantoprazole  40 mg Oral Daily  . pregabalin  50 mg Oral BID  . risperiDONE  0.25 mg Oral BID  . sucralfate  1 g Oral TID WC & HS  . tamsulosin  0.4 mg Oral QPC supper   Continuous Infusions: . sodium chloride Stopped (10/04/18 1504)  . sodium chloride 100 mL/hr at 10/08/18 1759    Active Problems:   Substance abuse ([name]   Diabetic neuropathy ([name]   AKI (acute kidney injury) ([place])   Syncope and collapse   Hypoglycemia   Acute metabolic encephalopathy   Hypoglycemia associated with diabetes ([place])   Left lower lobe pneumonia ([place])      Desiree Hane  [name]

## 2018-10-08 NOTE — Plan of Care (Signed)
progressing 

## 2018-10-08 NOTE — Consult Note (Signed)
[facility] Nurse wound consult note Reason for Consult: Partial thickness skin loss to posterior head. Patient reports fall a "couple" of weeks ago. Reports that when he combed his hair, a scab came off. Wound type: Trauma Pressure Injury POA: NA Measurement: 2cm x 3cm x 0.1cm Wound UPB:DHDI, dry Drainage (amount, consistency, odor) scant serous Periwound: with evidence of previous wound healing Dressing procedure/placement/frequency: I will implement a conservative POC using bacitracin ointment twice daily and leave open to air as dressing are a challenge to keep in place on hair. He is amenable to the ointment.  [facility] nursing team will not follow, but will remain available to this patient, the nursing and medical teams.  Please re-consult if needed. Thanks, Maudie Flakes, MSN, RN, [place], Arther Abbott  Pager# [phone]

## 2018-10-09 ENCOUNTER — Inpatient Hospital Stay (HOSPITAL_COMMUNITY): Payer: Medicare HMO

## 2018-10-09 DIAGNOSIS — I739 Peripheral vascular disease, unspecified: Secondary | ICD-10-CM

## 2018-10-09 DIAGNOSIS — L0390 Cellulitis, unspecified: Secondary | ICD-10-CM

## 2018-10-09 LAB — GLUCOSE, CAPILLARY
Glucose-Capillary: 130 mg/dL — ABNORMAL HIGH (ref 70–99)
Glucose-Capillary: 141 mg/dL — ABNORMAL HIGH (ref 70–99)
Glucose-Capillary: 122 mg/dL — ABNORMAL HIGH (ref 70–99)
Glucose-Capillary: 115 mg/dL — ABNORMAL HIGH (ref 70–99)
Glucose-Capillary: 74 mg/dL (ref 70–99)
Glucose-Capillary: 106 mg/dL — ABNORMAL HIGH (ref 70–99)
Glucose-Capillary: 147 mg/dL — ABNORMAL HIGH (ref 70–99)

## 2018-10-09 LAB — CULTURE, BLOOD (ROUTINE X 2)
Culture: NO GROWTH
Culture: NO GROWTH
Special Requests: ADEQUATE
Special Requests: ADEQUATE

## 2018-10-09 NOTE — Care Management Important Message (Signed)
Important Message  Patient Details IM Letter given to Rhea Pink SW to present to the Patient. Name: [name] MRN: [id number] Date of Birth: Nov 03, 1955   Medicare Important Message Given:  Yes     Kerin Salen 10/09/2018, 11:48 AM

## 2018-10-09 NOTE — TOC Progression Note (Signed)
Transition of Care [facility]) - Progression Note    Patient Details  Name: [name] MRN: [id number] Date of Birth: 06/21/1955  Transition of Care [facility]) CM/SW [place], LCSW Phone Number: 10/09/2018, 12:38 PM  Clinical Narrative:  Patient stated he wanted to go to [facility]. CSW stated that no facility made a bed offer and [facility] stated they are unable to take him. Patient stated he had really wanted to go to [facility] but stated going home with home health would be okay but stated he wanted a list to choice from. CSW gave patient a list of [place] agency     Expected Discharge Plan: [place] Barriers to Discharge: Continued Medical Work up, Ship broker, SNF Pending bed offer  Expected Discharge Plan and Services Expected Discharge Plan: [place] In-house Referral: Clinical Social Work Discharge Planning Services: [employer] Consult Post Acute Care Choice: [place] arrangements for the past 2 months: Single Family Home Expected Discharge Date: (unknown)                           [place] Agency: (Is active with [facility])         Social Determinants of Health (SDOH) Interventions    Readmission Risk Interventions Readmission Risk Prevention Plan 08/12/2018  Transportation Screening Complete  Medication Review Press photographer) Complete  PCP or Specialist appointment within 3-5 days of discharge Complete  HRI or [place] Complete  SW Recovery Care/Counseling Consult Complete  [place] Not Applicable  Some recent data might be hidden

## 2018-10-09 NOTE — Progress Notes (Signed)
Physical Therapy Treatment Patient Details Name: [name] MRN: [id number] DOB: 04-Feb-1956 Today's Date: 10/09/2018    History of Present Illness 63 y.o. male admitted on 10/03/18 for AMS and hypoglycemia. Pt with other significant PMH of diabetic retinopathy, HTN, DM 1, peripheral vascular balloon angio, R toe amputation, and polysubstance abuse    PT Comments    Pt assisted OOB and had loose BM on bed pad.  Pt ambulated to bathroom and assisted with pericare and changing gown.  Pt then able to ambulate in hallway with RW.  Pt presents with bil foot drop.  Continue to recommend SNF upon d/c.    Follow Up Recommendations  SNF     Equipment Recommendations  Rolling walker with 5" wheels    Recommendations for Other Services       Precautions / Restrictions Precautions Precautions: Fall    Mobility  Bed Mobility Overal bed mobility: Modified Independent             General bed mobility comments: increased time  Transfers Overall transfer level: Needs assistance Equipment used: Rolling walker (2 wheeled) Transfers: Sit to/from Stand Sit to Stand: Min assist         General transfer comment: cues for hand placement, assist to rise  Ambulation/Gait Ambulation/Gait assistance: Min assist   Assistive device: Rolling walker (2 wheeled) Gait Pattern/deviations: Decreased stride length;Trunk flexed Gait velocity: decreased   General Gait Details: pt ambulated in hallway as tolerated, pt performs increased hip/knee flexion bilaterally to clear feet - presents with bil foot drop - hx of peripheral neuropathy   Stairs             Wheelchair Mobility    Modified Rankin (Stroke Patients Only)       Balance                                            Cognition Arousal/Alertness: Awake/alert Behavior During Therapy: WFL for tasks assessed/performed Overall Cognitive Status: Within Functional Limits for tasks assessed                                         Exercises      General Comments        Pertinent Vitals/Pain Pain Assessment: 0-10 Pain Score: 8  Pain Location: bil LEs - neuropathy Pain Descriptors / Indicators: Burning Pain Intervention(s): Monitored during session;Repositioned    Home Living                      Prior Function            PT Goals (current goals can now be found in the care plan section) Progress towards PT goals: Progressing toward goals    Frequency    Min 2X/week      PT Plan Current plan remains appropriate    Co-evaluation              AM-PAC PT "6 Clicks" Mobility   Outcome Measure  Help needed turning from your back to your side while in a flat bed without using bedrails?: A Little Help needed moving from lying on your back to sitting on the side of a flat bed without using bedrails?: A Little Help needed moving to and from a bed  to a chair (including a wheelchair)?: A Little Help needed standing up from a chair using your arms (e.g., wheelchair or bedside chair)?: A Little Help needed to walk in hospital room?: A Little Help needed climbing 3-5 steps with a railing? : A Lot 6 Click Score: 17    End of Session Equipment Utilized During Treatment: Gait belt Activity Tolerance: Patient tolerated treatment well Patient left: in chair;with chair alarm set;with call bell/phone within reach Nurse Communication: Mobility status PT Visit Diagnosis: Other abnormalities of gait and mobility (R26.89);Muscle weakness (generalized) (M62.81)     Time: 3709-6438 PT Time Calculation (min) (ACUTE ONLY): 23 min  Charges:  $Gait Training: 8-22 mins $Therapeutic Activity: 8-22 mins                    Carmelia Bake, PT, DPT Acute Rehabilitation Services Office: [phone] Pager: [phone]  Trena Platt 10/09/2018, 3:15 PM

## 2018-10-09 NOTE — Progress Notes (Signed)
Pt woke up a bit "fuzzy" however he reoriented fairly easily

## 2018-10-09 NOTE — Progress Notes (Signed)
[name] PROGRESS NOTE  [name] YFV:[id number] DOB: 1956/03/27 DOA: 10/03/2018 PCP: Patient, No Pcp Per  Assessment/Plan: 1. Acute metabolic encephalopathy, resolved.  Multifactorial etiology: fentanyl overdose , hypoglycemia, pneumonia.  Continue antibiotics, close CBG monitoring, polysubstance abuse cessation/counseling provided 2. Unwitnessed syncope.  Likely related to admitted fentanyl use/overdose in addition to hypoglycemia. 3. Hypoglycemia, resolved.  [place] glucose of 35 on admission. Recurred  on 6/25 as patient avoided food after having a choking episode relate to his dysphagia, dropped to 30 and responded to D50. Will monitor additional 24 hours to ensure dysphagia well controlled and had no recurrent episodes of hypoglycemia. Close monitoring of short acting insulin 4. Left lower lobe pneumonia, stable on room air, likely aspiration pneumonia related to syncopal event.  Completed 7 day course of antibiotics ( zoysn x 4, augmentin x3 days) 5. AKI, mild. Back to baseline from peak of 1.3 after timed IVF. Baseline cr 0.9-1.1 currently 1.3.  monitor output,  6. Dysphagia. MBS concerning for esophageal component. EGD in 5/20 for evaluation of melena showed esophageal ulcers, PPI, carafate, aspiration precautions, dysphagia 3 diet, monitor if worsens may need GI input 7. 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  A1c greater than 9, fasting blood sugars in 300s. Hypoglycemia related to avoidance of food due to dysphagia Keep Lantus at 18 units nightly (takes 70/30 at home), continue sliding scale, NovoLog 4 units 3 times daily for meal coverage,lyrica. 8. Chronic normocytic anemia. hgb stable at 9, no blood loss 9. Polysubstance abuse. Amenable to cessation. Hopeful to go to rehab 10. PAD. History of PAD s/p RIight toe gangrene s/p amputation 2018. Persistent leg pain concerned for claudication, good pulses on exam, ABI shows worsening  pressures on left, would benefit from vascular follow up as outpatient 11. Posterior scalp wound. Says occurred after prior fall. No signs of infection, wound consulted-bactracin, no dressings due to area 12. Urinary retention. Foley discontinued on 6/26. Started on flomax here 13. Anxiety/mood. Continue risperdal, really wants to go to SNF.  Code Status: Full code Family Communication: No family at bedside (indicate person spoken with, relationship, and if by phone, the number) Disposition Plan: Monitor to ensure does well with diet without further dysphagia and avoidance of food to avoid recurrent hypoglycemia, if stable can go home with [facility] PT, PT recs SNF but due to his history of substance abuse and recent use prior to admission no facility will offer bed ( S/W explained to patient)   Consultants:  None  Procedures:  None  Antibiotics:  Augmentin, IV Zosyn (indicate start date, and stop date if known)  HPI/Subjective:  [name] is a 63 y.o. year old male with medical history significant for diabetes insulin-dependent, depression, hepatitis C, hypertension, polysubstance abuse with overdose  who presented on 10/03/2018 with altered mental status after being found down lying on his face by family surrounded by drug paraphernalia. He was required narcan and dextrose by EMS due to profound hypoglycemia.  Really wants to go to [facility] SNF  Objective: Vitals:   10/09/18 1330 10/09/18 2121  BP: 115/63 129/72  Pulse: 75 75  Resp: 14 20  Temp: 98.2 F (36.8 C) 98.4 F (36.9 C)  SpO2: 98% 98%    Intake/Output Summary (Last 24 hours) at 10/09/2018 2335 Last data filed at 10/09/2018 2118 Gross per 24 hour  Intake 1160 ml  Output 1400 ml  Net -240 ml   Filed Weights   10/03/18 1855 10/03/18 1859 10/04/18 0129  Weight:  72.6 kg 72.5 kg 61.8 kg    Exam:   General:  Elderly male, no distress,   Cardiovascular: RRR, no edema  Respiratory: normal effort on room air, clear  breath sounds  Abdomen: soft, non -tender, normal bowel sounds  Musculoskeletal: normal ROM   Skin dry, intact, R big toe amputated, wound on posterior scalp with no drainage slightly erythematous  Neurologic alert, oriented x 4  Psych: anxious  Data Reviewed: Basic Metabolic Panel: Recent Labs  Lab 10/04/18 0410 10/05/18 0421 10/05/18 0811 10/06/18 0541 10/07/18 0443 10/08/18 0451  NA 133*  --  134* 135 135 133*  K 4.3  --  3.3* 3.4* 4.2 3.9  CL 100  --  100 104 106 104  CO2 24  --  25 21* 22 23  GLUCOSE 179*  --  144* 254* 376* 250*  BUN 25*  --  15 16 18 17   CREATININE 1.17 1.16 1.13 1.20 1.30* 0.97  CALCIUM 8.2*  --  8.7* 8.2* 8.3* 8.4*  MG 1.9  --   --   --  2.0  --   PHOS 3.0  --   --   --   --   --    Liver Function Tests: Recent Labs  Lab 10/03/18 1904  AST 30  ALT 31  ALKPHOS 108  BILITOT 0.4  PROT 6.8  ALBUMIN 2.9*   No results for input(s): LIPASE, AMYLASE in the last 168 hours. No results for input(s): AMMONIA in the last 168 hours. CBC: Recent Labs  Lab 10/04/18 0410 10/05/18 0811 10/06/18 0541 10/07/18 0443 10/08/18 0451  WBC 6.1 6.6 5.7 5.7 6.0  HGB 8.4* 10.0* 9.0* 8.9* 8.8*  HCT 26.2* 31.2* 27.5* 28.5* 28.5*  MCV 87.3 86.9 86.5 88.8 88.5  PLT 271 290 255 255 293   Cardiac Enzymes: No results for input(s): CKTOTAL, CKMB, CKMBINDEX, TROPONINI in the last 168 hours. BNP (last 3 results) No results for input(s): BNP in the last 8760 hours.  ProBNP (last 3 results) No results for input(s): PROBNP in the last 8760 hours.  CBG: Recent Labs  Lab 10/09/18 1235 10/09/18 1707 10/09/18 2008 10/09/18 2123 10/09/18 2240  GLUCAP 122* 115* 74 106* 147*    Recent Results (from the past 240 hour(s))  SARS Coronavirus 2 (CEPHEID- Performed in [place] hospital lab), Hosp Order     Status: None   Collection Time: 10/03/18  7:50 PM   Specimen: Nasopharyngeal Swab  Result Value Ref Range Status   SARS Coronavirus 2 NEGATIVE NEGATIVE  Final    Comment: (NOTE) If result is NEGATIVE SARS-CoV-2 target nucleic acids are NOT DETECTED. The SARS-CoV-2 RNA is generally detectable in upper and lower  respiratory specimens during the acute phase of infection. The lowest  concentration of SARS-CoV-2 viral copies this assay can detect is 250  copies / mL. A negative result does not preclude SARS-CoV-2 infection  and should not be used as the sole basis for treatment or other  patient management decisions.  A negative result may occur with  improper specimen collection / handling, submission of specimen other  than nasopharyngeal swab, presence of viral mutation(s) within the  areas targeted by this assay, and inadequate number of viral copies  (<250 copies / mL). A negative result must be combined with clinical  observations, patient history, and epidemiological information. If result is POSITIVE SARS-CoV-2 target nucleic acids are DETECTED. The SARS-CoV-2 RNA is generally detectable in upper and lower  respiratory specimens dur ing the  acute phase of infection.  Positive  results are indicative of active infection with SARS-CoV-2.  Clinical  correlation with patient history and other diagnostic information is  necessary to determine patient infection status.  Positive results do  not rule out bacterial infection or co-infection with other viruses. If result is PRESUMPTIVE POSTIVE SARS-CoV-2 nucleic acids MAY BE PRESENT.   A presumptive positive result was obtained on the submitted specimen  and confirmed on repeat testing.  While 2019 novel coronavirus  (SARS-CoV-2) nucleic acids may be present in the submitted sample  additional confirmatory testing may be necessary for epidemiological  and / or clinical management purposes  to differentiate between  SARS-CoV-2 and other Sarbecovirus currently known to infect humans.  If clinically indicated additional testing with an alternate test  methodology [phone]) is advised. The  SARS-CoV-2 RNA is generally  detectable in upper and lower respiratory sp ecimens during the acute  phase of infection. The expected result is Negative. Fact Sheet for Patients:  StrictlyIdeas.no Fact Sheet for Healthcare Providers: BankingDealers.co.za This test is not yet approved or cleared by the [place] FDA and has been authorized for detection and/or diagnosis of SARS-CoV-2 by FDA under an Emergency Use Authorization (EUA).  This EUA will remain in effect (meaning this test can be used) for the duration of the COVID-19 declaration under Section 564(b)(1) of the Act, 21 U.S.C. section 360bbb-3(b)(1), unless the authorization is terminated or revoked sooner. Performed at [facility], [place] [address], [place], [place] [address]   Culture, blood (routine x 2)     Status: None   Collection Time: 10/03/18  8:04 PM   Specimen: BLOOD LEFT HAND  Result Value Ref Range Status   Specimen Description   Final    BLOOD LEFT HAND Performed at [facility] [address], [place], [place] [address]    Special Requests   Final    BOTTLES DRAWN AEROBIC AND ANAEROBIC Blood Culture adequate volume Performed at [facility] [address], [place], [place] [address]    Culture   Final    NO GROWTH 5 DAYS Performed at [facility], [place] [address] [place], [place] [address]    Report Status 10/09/2018 FINAL  Final  Culture, blood (routine x 2)     Status: None   Collection Time: 10/03/18  8:05 PM   Specimen: Right Antecubital; Blood  Result Value Ref Range Status   Specimen Description   Final    RIGHT ANTECUBITAL Performed at [facility] [address], [place], [place] [address]    Special Requests   Final    BOTTLES DRAWN AEROBIC AND ANAEROBIC Blood Culture adequate volume Performed at [facility] [address]  [place], [place] [address]    Culture   Final    NO GROWTH 5 DAYS Performed at [facility], [place] [address], [place], [place] [address]    Report Status 10/09/2018 FINAL  Final  MRSA PCR Screening     Status: None   Collection Time: 10/04/18  1:50 AM   Specimen: Nasal Mucosa; Nasopharyngeal  Result Value Ref Range Status   MRSA by PCR NEGATIVE NEGATIVE Final    Comment:        The GeneXpert MRSA Assay (FDA approved for NASAL specimens only), is one component of a comprehensive MRSA colonization surveillance program. It is not intended to diagnose MRSA infection nor to guide or monitor treatment for MRSA  infections. Performed at [facility], [place] [address] [place],  [address]      Studies: Vas [place] Abi With/wo Tbi  Result Date: 10/09/2018 LOWER EXTREMITY DOPPLER STUDY Indications: Ulceration, and peripheral artery disease. High Risk Factors: Diabetes.  Vascular Interventions: Angioplasty- Rt anterior tibial artery,superficial                         femoral artery and popliteal artery stent 07/26/2016. Limitations: Right great toe amputation Comparison Study: Comparison study available 08/27/2016 Performing Technologist: Birdena Crandall, [place]  Examination Guidelines: A complete evaluation includes at minimum, Doppler waveform signals and systolic blood pressure reading at the level of bilateral brachial, anterior tibial, and posterior tibial arteries, when vessel segments are accessible. Bilateral testing is considered an integral part of a complete examination. Photoelectric Plethysmograph (PPG) waveforms and toe systolic pressure readings are included as required and additional duplex testing as needed. Limited examinations for reoccurring indications may be performed as noted.  ABI Findings: +---------+------------------+-----+----------+---------------+ Right    Rt Pressure (mmHg)IndexWaveform  Comment          +---------+------------------+-----+----------+---------------+ Brachial 142                    triphasic                 +---------+------------------+-----+----------+---------------+ PTA      125               0.88 monophasicsounds biphasic +---------+------------------+-----+----------+---------------+ DP       120               0.85 biphasic                  +---------+------------------+-----+----------+---------------+ Great Toe                                 Amputated       +---------+------------------+-----+----------+---------------+ +---------+------------------+-----+-------------------+---------+ Left     Lt Pressure (mmHg)IndexWaveform           Comment   +---------+------------------+-----+-------------------+---------+ Brachial 138                    triphasic                    +---------+------------------+-----+-------------------+---------+ PTA      43                0.30 dampened monophasic          +---------+------------------+-----+-------------------+---------+ DP       255               1.80 dampened monophasicCalcified +---------+------------------+-----+-------------------+---------+ Great Toe33                0.23                              +---------+------------------+-----+-------------------+---------+ +-------+-----------+-------------------+----------------+-------------------+ ABI/TBIToday's ABIToday's TBI        Previous ABI    Previous TBI        +-------+-----------+-------------------+----------------+-------------------+ Right  0.88       Amputated Great Toe0.94            Amputated Great Toe +-------+-----------+-------------------+----------------+-------------------+ Left   Calcified  0.23               Non compressible0.47                +-------+-----------+-------------------+----------------+-------------------+  Right ABIs appear essentially unchanged. Left ABIs and TBIs appear decreased.  Unable to obtain the right TBI due to amputation of the reat toe 07/26/2016  Summary: Right: Resting right ankle-brachial index indicates mild right lower extremity arterial disease. Left: Resting left ankle-brachial index indicates noncompressible left lower extremity arteries.The left toe-brachial index is abnormal.  *See table(s) above for measurements and observations.  Electronically signed by Curt Jews MD on 10/09/2018 at 3:52:14 PM.    Final     Scheduled Meds: . amLODipine  10 mg Oral Daily  . amoxicillin-clavulanate  1 tablet Oral Q12H  . bacitracin   Topical BID  . folic acid  1 mg Oral Daily  . insulin aspart  0-9 Units Subcutaneous TID WC  . insulin aspart  4 Units Subcutaneous TID WC  . insulin glargine  18 Units Subcutaneous QHS  . nicotine  21 mg Transdermal Daily  . pantoprazole  40 mg Oral Daily  . pregabalin  50 mg Oral BID  . risperiDONE  0.25 mg Oral BID  . sucralfate  1 g Oral TID WC & HS  . tamsulosin  0.4 mg Oral QPC supper   Continuous Infusions: . sodium chloride Stopped (10/04/18 9311)    Active Problems:   Substance abuse ([name]   Diabetic neuropathy ([name]   AKI (acute kidney injury) ([place])   Syncope and collapse   Hypoglycemia   Acute metabolic encephalopathy   Hypoglycemia associated with diabetes ([place])   Left lower lobe pneumonia ([place])      Desiree Hane  [name]

## 2018-10-09 NOTE — Progress Notes (Signed)
Bilateral ABIs completed. Preliminary results in Chart review CV Proc. [facility], [place] 10/09/2018, 10:59 AM

## 2018-10-10 LAB — GLUCOSE, CAPILLARY
Glucose-Capillary: 107 mg/dL — ABNORMAL HIGH (ref 70–99)
Glucose-Capillary: 85 mg/dL (ref 70–99)
Glucose-Capillary: 262 mg/dL — ABNORMAL HIGH (ref 70–99)
Glucose-Capillary: 221 mg/dL — ABNORMAL HIGH (ref 70–99)

## 2018-10-10 NOTE — Progress Notes (Signed)
[name] PROGRESS NOTE  [name] TGG:[id number] DOB: 1955-10-21 DOA: 10/03/2018 PCP: Patient, No Pcp Per  Assessment/Plan: 1. Acute metabolic encephalopathy, resolved.  Multifactorial etiology: fentanyl overdose , hypoglycemia, pneumonia.  Continue antibiotics, close CBG monitoring, polysubstance abuse cessation/counseling provided 2. Unwitnessed syncope.  Likely related to admitted fentanyl use/overdose in addition to hypoglycemia. 3. Hypoglycemia, resolved.  Nadir glucose of 35 on admission. Recurred  on 6/25 as patient avoided food after having a choking episode relate to his dysphagia, dropped to 30 and responded to D50.  CBGs relatively stable.  Have blood sugar of 85 today.  Will stop scheduled NovoLog at meals 4. Left lower lobe pneumonia, stable on room air, likely aspiration pneumonia related to syncopal event.  Completed 7 day course of antibiotics ( zoysn x 4, augmentin x3 days) 5. AKI, mild.  Resolved.  Back to baseline from peak of 1.3 after timed IVF.  Now back to baseline 6. Dysphagia. MBS concerning for esophageal component. EGD in 5/20 for evaluation of melena showed esophageal ulcers, PPI, carafate, aspiration precautions, seems to be tolerating dysphagia 3 diet 7. 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  A1c greater than 9, fasting blood sugars in 300s. Hypoglycemia related to avoidance of food due to dysphagia Keep Lantus at 18 units nightly (takes 70/30 at home), continue sliding scale, NovoLog 4 units 3 times daily for meal coverage,lyrica. 8. Chronic normocytic anemia. Stable around 9 9. Polysubstance abuse. Amenable to cessation. Hopeful to go to rehab 10. PAD. History of PAD s/p RIight toe gangrene s/p amputation 2018. Persistent leg pain concerned for claudication, good pulses on exam, ABI shows worsening pressures on left, would benefit from vascular follow up as outpatient 11. Posterior scalp wound. Says occurred after  prior fall. No signs of infection, wound consulted-bactracin, no dressings due to area 12. Urinary retention. Foley discontinued on 6/26. Started on flomax here 13. Anxiety/mood. Continue risperdal, really wants to go to SNF.  Code Status: Full code Family Communication: Declined for me to call his son Disposition Plan: Anticipate discharge tomorrow if no severe hypoglycemic events monitor to ensure does well with diet without further dysphagia and avoidance of food to avoid recurrent hypoglycemia, if stable can go home with [facility] PT, PT recs SNF but due to his history of substance abuse and recent use prior to admission no facility will offer bed ( S/W explained to patient)   Consultants:  None  Procedures:  None  Antibiotics:  Completed 7 days of Zosyn/Augmentin  HPI/Subjective:  [name] is a 63 y.o. year old male with medical history significant for diabetes insulin-dependent, depression, hepatitis C, hypertension, polysubstance abuse with overdose  who presented on 10/03/2018 with altered mental status after being found down lying on his face by family surrounded by drug paraphernalia. He was required narcan and dextrose by EMS due to profound hypoglycemia.  Hospitalization noted for episodes of hypoglycemia.  Seen by speech and swallowing adjusted for dysphagia 3.  Patient with no complaints other than getting used to dysphagia 3 diet.  Objective: Vitals:   10/10/18 0537 10/10/18 1300  BP: (!) 148/70 134/75  Pulse: 68 79  Resp: 18 16  Temp: 97.9 F (36.6 C) 98.2 F (36.8 C)  SpO2: 97% 99%    Intake/Output Summary (Last 24 hours) at 10/10/2018 1729 Last data filed at 10/10/2018 0414 Gross per 24 hour  Intake 360 ml  Output 1101 ml  Net -741 ml   Filed Weights   10/03/18 1855 10/03/18 1859  10/04/18 0129  Weight: 72.6 kg 72.5 kg 61.8 kg    Exam:   General: Alert and oriented x3, no acute distress  Cardiovascular: RRR, no edema  Respiratory: normal effort on  room air, clear breath sounds  Abdomen: soft, non -tender, normal bowel sounds  Musculoskeletal: normal ROM   Skin dry, intact, R big toe amputated, wound on posterior scalp with no drainage slightly erythematous  Neurologic alert, oriented x 4  Psych: Mildly anxious, no evidence of psychoses  Data Reviewed: Basic Metabolic Panel: Recent Labs  Lab 10/04/18 0410 10/05/18 0421 10/05/18 0811 10/06/18 0541 10/07/18 0443 10/08/18 0451  NA 133*  --  134* 135 135 133*  K 4.3  --  3.3* 3.4* 4.2 3.9  CL 100  --  100 104 106 104  CO2 24  --  25 21* 22 23  GLUCOSE 179*  --  144* 254* 376* 250*  BUN 25*  --  15 16 18 17   CREATININE 1.17 1.16 1.13 1.20 1.30* 0.97  CALCIUM 8.2*  --  8.7* 8.2* 8.3* 8.4*  MG 1.9  --   --   --  2.0  --   PHOS 3.0  --   --   --   --   --    Liver Function Tests: Recent Labs  Lab 10/03/18 1904  AST 30  ALT 31  ALKPHOS 108  BILITOT 0.4  PROT 6.8  ALBUMIN 2.9*   No results for input(s): LIPASE, AMYLASE in the last 168 hours. No results for input(s): AMMONIA in the last 168 hours. CBC: Recent Labs  Lab 10/04/18 0410 10/05/18 0811 10/06/18 0541 10/07/18 0443 10/08/18 0451  WBC 6.1 6.6 5.7 5.7 6.0  HGB 8.4* 10.0* 9.0* 8.9* 8.8*  HCT 26.2* 31.2* 27.5* 28.5* 28.5*  MCV 87.3 86.9 86.5 88.8 88.5  PLT 271 290 255 255 293   Cardiac Enzymes: No results for input(s): CKTOTAL, CKMB, CKMBINDEX, TROPONINI in the last 168 hours. BNP (last 3 results) No results for input(s): BNP in the last 8760 hours.  ProBNP (last 3 results) No results for input(s): PROBNP in the last 8760 hours.  CBG: Recent Labs  Lab 10/09/18 2123 10/09/18 2240 10/10/18 0804 10/10/18 1213 10/10/18 1727  GLUCAP 106* 147* 107* 85 262*    Recent Results (from the past 240 hour(s))  SARS Coronavirus 2 (CEPHEID- Performed in [place] hospital lab), Hosp Order     Status: None   Collection Time: 10/03/18  7:50 PM   Specimen: Nasopharyngeal Swab  Result Value Ref Range  Status   SARS Coronavirus 2 NEGATIVE NEGATIVE Final    Comment: (NOTE) If result is NEGATIVE SARS-CoV-2 target nucleic acids are NOT DETECTED. The SARS-CoV-2 RNA is generally detectable in upper and lower  respiratory specimens during the acute phase of infection. The lowest  concentration of SARS-CoV-2 viral copies this assay can detect is 250  copies / mL. A negative result does not preclude SARS-CoV-2 infection  and should not be used as the sole basis for treatment or other  patient management decisions.  A negative result may occur with  improper specimen collection / handling, submission of specimen other  than nasopharyngeal swab, presence of viral mutation(s) within the  areas targeted by this assay, and inadequate number of viral copies  (<250 copies / mL). A negative result must be combined with clinical  observations, patient history, and epidemiological information. If result is POSITIVE SARS-CoV-2 target nucleic acids are DETECTED. The SARS-CoV-2 RNA is generally detectable  in upper and lower  respiratory specimens dur ing the acute phase of infection.  Positive  results are indicative of active infection with SARS-CoV-2.  Clinical  correlation with patient history and other diagnostic information is  necessary to determine patient infection status.  Positive results do  not rule out bacterial infection or co-infection with other viruses. If result is PRESUMPTIVE POSTIVE SARS-CoV-2 nucleic acids MAY BE PRESENT.   A presumptive positive result was obtained on the submitted specimen  and confirmed on repeat testing.  While 2019 novel coronavirus  (SARS-CoV-2) nucleic acids may be present in the submitted sample  additional confirmatory testing may be necessary for epidemiological  and / or clinical management purposes  to differentiate between  SARS-CoV-2 and other Sarbecovirus currently known to infect humans.  If clinically indicated additional testing with an alternate  test  methodology [phone]) is advised. The SARS-CoV-2 RNA is generally  detectable in upper and lower respiratory sp ecimens during the acute  phase of infection. The expected result is Negative. Fact Sheet for Patients:  StrictlyIdeas.no Fact Sheet for Healthcare Providers: BankingDealers.co.za This test is not yet approved or cleared by the [place] FDA and has been authorized for detection and/or diagnosis of SARS-CoV-2 by FDA under an Emergency Use Authorization (EUA).  This EUA will remain in effect (meaning this test can be used) for the duration of the COVID-19 declaration under Section 564(b)(1) of the Act, 21 U.S.C. section 360bbb-3(b)(1), unless the authorization is terminated or revoked sooner. Performed at [facility], [place] [address], [place], [place] [address]   Culture, blood (routine x 2)     Status: None   Collection Time: 10/03/18  8:04 PM   Specimen: BLOOD LEFT HAND  Result Value Ref Range Status   Specimen Description   Final    BLOOD LEFT HAND Performed at [facility] [address], [place], [place] [address]    Special Requests   Final    BOTTLES DRAWN AEROBIC AND ANAEROBIC Blood Culture adequate volume Performed at [facility] [address], [place], [place] [address]    Culture   Final    NO GROWTH 5 DAYS Performed at [facility], [place] [address], [place], [place] [address]    Report Status 10/09/2018 FINAL  Final  Culture, blood (routine x 2)     Status: None   Collection Time: 10/03/18  8:05 PM   Specimen: Right Antecubital; Blood  Result Value Ref Range Status   Specimen Description   Final    RIGHT ANTECUBITAL Performed at [facility] [address], [place], [place] [address]    Special Requests   Final    BOTTLES DRAWN AEROBIC AND ANAEROBIC Blood Culture adequate volume Performed at [facility] [address], [name], [place] [address]    Culture   Final    NO GROWTH 5 DAYS Performed at [facility], [place] [address] [place], [place] [address]    Report Status 10/09/2018 FINAL  Final  MRSA PCR Screening     Status: None   Collection Time: 10/04/18  1:50 AM   Specimen: Nasal Mucosa; Nasopharyngeal  Result Value Ref Range Status   MRSA by PCR NEGATIVE NEGATIVE Final    Comment:        The GeneXpert MRSA Assay (FDA approved for NASAL specimens only), is one component of a comprehensive MRSA colonization surveillance program. It is not intended to diagnose  MRSA infection nor to guide or monitor treatment for MRSA infections. Performed at [facility], [place] [address] [place], [place] [address]      Studies: Vas [place] Abi With/wo Tbi  Result Date: 10/09/2018 LOWER EXTREMITY DOPPLER STUDY Indications: Ulceration, and peripheral artery disease. High Risk Factors: Diabetes.  Vascular Interventions: Angioplasty- Rt anterior tibial artery,superficial                         femoral artery and popliteal artery stent 07/26/2016. Limitations: Right great toe amputation Comparison Study: Comparison study available 08/27/2016 Performing Technologist: Birdena Crandall, [place]  Examination Guidelines: A complete evaluation includes at minimum, Doppler waveform signals and systolic blood pressure reading at the level of bilateral brachial, anterior tibial, and posterior tibial arteries, when vessel segments are accessible. Bilateral testing is considered an integral part of a complete examination. Photoelectric Plethysmograph (PPG) waveforms and toe systolic pressure readings are included as required and additional duplex testing as needed. Limited examinations for reoccurring indications may be performed as noted.  ABI Findings: +---------+------------------+-----+----------+---------------+ Right    Rt Pressure (mmHg)IndexWaveform  Comment          +---------+------------------+-----+----------+---------------+ Brachial 142                    triphasic                 +---------+------------------+-----+----------+---------------+ PTA      125               0.88 monophasicsounds biphasic +---------+------------------+-----+----------+---------------+ DP       120               0.85 biphasic                  +---------+------------------+-----+----------+---------------+ Great Toe                                 Amputated       +---------+------------------+-----+----------+---------------+ +---------+------------------+-----+-------------------+---------+ Left     Lt Pressure (mmHg)IndexWaveform           Comment   +---------+------------------+-----+-------------------+---------+ Brachial 138                    triphasic                    +---------+------------------+-----+-------------------+---------+ PTA      43                0.30 dampened monophasic          +---------+------------------+-----+-------------------+---------+ DP       255               1.80 dampened monophasicCalcified +---------+------------------+-----+-------------------+---------+ Great Toe33                0.23                              +---------+------------------+-----+-------------------+---------+ +-------+-----------+-------------------+----------------+-------------------+ ABI/TBIToday's ABIToday's TBI        Previous ABI    Previous TBI        +-------+-----------+-------------------+----------------+-------------------+ Right  0.88       Amputated Great Toe0.94            Amputated Great Toe +-------+-----------+-------------------+----------------+-------------------+ Left   Calcified  0.23  Non compressible0.47                +-------+-----------+-------------------+----------------+-------------------+ Right ABIs appear essentially unchanged. Left ABIs and TBIs appear decreased.  Unable to obtain the right TBI due to amputation of the reat toe 07/26/2016  Summary: Right: Resting right ankle-brachial index indicates mild right lower extremity arterial disease. Left: Resting left ankle-brachial index indicates noncompressible left lower extremity arteries.The left toe-brachial index is abnormal.  *See table(s) above for measurements and observations.  Electronically signed by Curt Jews MD on 10/09/2018 at 3:52:14 PM.    Final     Scheduled Meds: . amLODipine  10 mg Oral Daily  . bacitracin   Topical BID  . folic acid  1 mg Oral Daily  . insulin aspart  0-9 Units Subcutaneous TID WC  . insulin aspart  4 Units Subcutaneous TID WC  . insulin glargine  18 Units Subcutaneous QHS  . nicotine  21 mg Transdermal Daily  . pantoprazole  40 mg Oral Daily  . pregabalin  50 mg Oral BID  . risperiDONE  0.25 mg Oral BID  . sucralfate  1 g Oral TID WC & HS  . tamsulosin  0.4 mg Oral QPC supper   Continuous Infusions: . sodium chloride Stopped (10/04/18 2409)    Active Problems:   Substance abuse ([name]   Diabetic neuropathy ([name]   PAD (peripheral artery disease) ([name]   AKI (acute kidney injury) ([place])   Syncope and collapse   Hypoglycemia   Acute metabolic encephalopathy   Hypoglycemia associated with diabetes ([place])   Left lower lobe pneumonia ([place])      [facility]

## 2018-10-11 LAB — GLUCOSE, CAPILLARY
Glucose-Capillary: 150 mg/dL — ABNORMAL HIGH (ref 70–99)
Glucose-Capillary: 240 mg/dL — ABNORMAL HIGH (ref 70–99)

## 2018-10-11 MED ORDER — TAMSULOSIN HCL 0.4 MG PO CAPS: 0.4000 mg | ORAL_CAPSULE | Freq: Every day | ORAL | 1 refills | Status: DC

## 2018-10-11 MED ORDER — BACITRACIN ZINC 500 UNIT/GM EX OINT: TOPICAL_OINTMENT | Freq: Two times a day (BID) | CUTANEOUS | 0 refills | Status: DC

## 2018-10-11 MED ORDER — INSULIN ASPART PROT & ASPART (70-30 MIX) 100 UNIT/ML PEN: PEN_INJECTOR | SUBCUTANEOUS | 1 refills | Status: DC

## 2018-10-11 MED ORDER — INSULIN ASPART 100 UNIT/ML ~~LOC~~ SOLN: SUBCUTANEOUS | 11 refills | Status: DC

## 2018-10-11 NOTE — TOC Initial Note (Signed)
Transition of Care [facility]) - Initial/Assessment Note    Patient Details  Name: [name] MRN: [id number] Date of Birth: [birth year]  Transition of Care [facility]) CM/SW Contact:    Joaquin Courts, RN Phone Number: 10/11/2018, 11:26 AM  Clinical Narrative:   CM spoke with patient at bedside. Patient reports he plans to return home and has had [facility] services with advance which he wishes to continue.  [facility] rep, jason, notified and pt set-up for HHPT/OT/aide/social work.  Patient also states he got a walker recently (a week ago) through [facility] and which has since broken and he is in need of another as well as a tub bench. [employer] rep states insurance will not pay for another walker or tub bench and this will incur an out of pocket cost for the patient. Patient states he cannot afford the out of pocket cost. Insurance can pay for dme 3-in-1 which patient is agreeable to and patient states he will buy his own walker from [place] after d/c. [employer] arranged to deliver 3-in-1 to bedside for home use.  Patient also states his son is going out of town and will not be able to pick him up and that son has his debit card so pt has no money to pick up scripts. CM spoke with son who states that he will be back this evening or can have another relative pick up patient from hospital and take him home. Patient plans to call son to arrange for transportation and ensure debit card is available to pick up prescriptions.                    Expected Discharge Plan: [place] Barriers to Discharge: No Barriers Identified   Patient Goals and CMS Choice Patient states their goals for this hospitalization and ongoing recovery are:: to go home CMS Medicare.gov Compare Post Acute Care list provided to:: Patient Choice offered to / list presented to : Patient  Expected Discharge Plan and Services Expected Discharge Plan: [place] In-house Referral: Clinical Social Work Discharge Planning  Services: CM Consult Post Acute Care Choice: Durable Medical Equipment, Home Health Living arrangements for the past 2 months: Single Family Home Expected Discharge Date: 10/11/18               DME Arranged: 3-N-1 DME Agency: [employer] Date DME Agency Contacted: 10/11/18 Time DME Agency Contacted: [id number] Representative spoke with at DME Agency: Jeneen Rinks HH Arranged: OT, PT, Nurse's Aide, Social Work [employer] Agency: [place] ([place]) Date [place]: 10/11/18 Time [place]: 1125 Representative spoke with at [place]: [place] Arrangements/Services Living arrangements for the past 2 months: [place] with:: Self, Adult Children, Relatives Patient language and need for interpreter reviewed:: Yes Do you feel safe going back to the place where you live?: Yes      Need for Family Participation in Patient Care: Yes (Comment) Care giver support system in place?: Yes (comment) Current home services: DME Criminal Activity/Legal Involvement Pertinent to Current Situation/Hospitalization: No - Comment as needed  Activities of Daily Living Home Assistive Devices/Equipment: Gilford Rile (specify type) ADL Screening (condition at time of admission) Patient's cognitive ability adequate to safely complete daily activities?: Yes Is the patient deaf or have difficulty hearing?: No Does the patient have difficulty seeing, even when wearing glasses/contacts?: No Does the patient have difficulty concentrating, remembering, or making decisions?: No Patient able to express need for assistance  with ADLs?: Yes Does the patient have difficulty dressing or bathing?: No Independently performs ADLs?: Yes (appropriate for developmental age) Does the patient have difficulty walking or climbing stairs?: Yes Weakness of Legs: Both Weakness of Arms/Hands: Both  Permission Sought/Granted      Share Information with NAME: Gaspar Bidding     Permission granted to share  info w Relationship: Son  Permission granted to share info w Contact Information: [phone]  Emotional Assessment Appearance:: Appears stated age Attitude/Demeanor/Rapport: Engaged Affect (typically observed): Accepting Orientation: : Oriented to Self, Oriented to Place, Oriented to  Time, Oriented to Situation Alcohol / Substance Use: Not Applicable Psych Involvement: No (comment)  Admission diagnosis:  Hypoglycemia [E16.2] Accidental drug overdose, initial encounter [T50.901A] Pneumonia due to infectious organism, unspecified laterality, unspecified part of lung [J18.9] Patient Active Problem List   Diagnosis Date Noted  . Left lower lobe pneumonia ([place]) 10/07/2018  . Acute respiratory failure ([place])   . Altered mental status   . Gastroesophageal reflux disease   . Hallucinations   . Hypoglycemia associated with diabetes ([place]) 08/12/2018  . At risk for adverse drug event 02/17/2018  . Abrasions of multiple sites   . Demand ischemia of myocardium ([place]) 02/05/2018  . Acute metabolic encephalopathy 23/55/7322  . DKA (diabetic ketoacidoses) ([place]) 02/03/2018  . Leukocytosis 02/03/2018  . Hypoglycemia 12/24/2017  . Syncope 12/24/2017  . Syncope and collapse 12/23/2017  . IV drug abuse ([place]) 04/19/2017  . Acute encephalopathy 04/19/2017  . Hemangioma 10/02/2016  . Foreign body (FB) in soft tissue   . 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 Gangrene ([place])   . Type I (juvenile type) diabetes mellitus without mention of complication, not stated as uncontrolled   . Tobacco abuse   . Essential hypertension   . Diabetic infection of right foot ([place]) 07/22/2016  . Diabetic foot infection ([place]) 07/22/2016  . Chronic ulcer of heel, right, with fat layer exposed ([place]) 05/20/2016  . DKA, type 1 ([place]) 04/18/2016  . AKI (acute kidney injury) ([place]) 04/18/2016  . Hyponatremia 04/18/2016  . Severe protein-calorie malnutrition ([place]) 04/18/2016  . Elevated troponin 04/18/2016  . Poor  dentition 07/12/2015  . Tobacco use 07/06/2013  . PAD (peripheral artery disease) ([place]) 04/29/2013  . COPD, mild ([place])   . 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 Diabetic neuropathy ([place]) 06/21/2010  . PROLIFERATIVE DIABETIC RETINOPATHY 06/21/2010  . SKIN TAG 01/30/2010  . Chronic hepatitis C ([place]) 11/22/2008  . VITILIGO 11/22/2008  . PROTEINURIA, MILD 11/22/2008  . Substance abuse ([place]) 04/23/2007  . DEPRESSION 11/11/2006   PCP:  Patient, No Pcp Per Pharmacy:   [facility] [address] [place] [address] [place] [place] [address] Phone: [phone] Fax: [phone]     Social Determinants of Health (SDOH) Interventions    Readmission Risk Interventions Readmission Risk Prevention Plan 08/12/2018  Transportation Screening Complete  Medication Review Press photographer) Complete  PCP or Specialist appointment within 3-5 days of discharge Complete  HRI or [place] Complete  SW Recovery Care/Counseling Consult Complete  [place] Not Applicable  Some recent data might be hidden

## 2018-10-11 NOTE — Progress Notes (Signed)
Close window Medicare.gov - the Conservation officer, historic buildings for [place] health agencies that serve [phone]. [place] Quality of Patient Care Rating Patient Survey Summary Rating [facility] [phone] 3 out of 5 stars 4 out of [place] [phone] 4  out of 5 stars 3 out of [place] [phone] 4 out of 5 stars 4 out of [place] [phone] 4  out of 5 stars 4 out of [place] [phone] 4 out of 5 stars 4 out of [place] [phone] 4 out of 5 stars 4 out of 5 stars [facility] [phone] 3  out of 5 stars 4 out of [place] [phone] 3 out of 5 stars 4 out of 5 stars [facility] [phone] 3  out of 5 stars 3 out of [place] [phone] 3  out of 5 stars 4 out of [place] [phone] 3  out of 5 stars 3 out of [place] [phone] 4  out of 5 stars 3 out of [place] number Footnote as displayed on [place] 1 This agency provides services under a federal waiver program to non-traditional, chronic long term population. 2 This agency provides services to a special needs population. 3 Not Available. 4 The number of patient episodes for this measure is too small to report. 5 This measure currently does not have data or provider has been certified/recertified for less than 6 months. 6 The national average for this measure is not provided because of state-to-state differences in data collection. 7 Medicare is not displaying rates for this measure for any home health agency, because of an issue with the  data. 8 There were problems with the data and they are being corrected. 9 Zero, or very few, patients met the survey's rules for inclusion. The scores shown, if any, reflect a very small number of surveys and may not accurately tell how an agency is doing. 10 Survey results are based on less than 12 months of data. 11 Fewer than 70 patients completed the survey. Use the scores shown, if any, with caution as the number of surveys may be too low to accurately tell how an agency is doing. 12 No survey results are available for this period. 13 Data suppressed by CMS for one or more quarters.

## 2018-10-11 NOTE — Plan of Care (Signed)

## 2018-10-13 ENCOUNTER — Encounter: Payer: Self-pay | Admitting: Gastroenterology

## 2018-10-13 ENCOUNTER — Ambulatory Visit (INDEPENDENT_AMBULATORY_CARE_PROVIDER_SITE_OTHER): Payer: Medicare HMO | Admitting: Gastroenterology

## 2018-10-13 VITALS — Ht 72.0 in | Wt 136.0 lb

## 2018-10-13 DIAGNOSIS — K219 Gastro-esophageal reflux disease without esophagitis: Secondary | ICD-10-CM | POA: Diagnosis not present

## 2018-10-13 DIAGNOSIS — K209 Esophagitis, unspecified without bleeding: Secondary | ICD-10-CM | POA: Insufficient documentation

## 2018-10-13 DIAGNOSIS — R195 Other fecal abnormalities: Secondary | ICD-10-CM | POA: Insufficient documentation

## 2018-10-13 DIAGNOSIS — Z0000 Encounter for general adult medical examination without abnormal findings: Secondary | ICD-10-CM | POA: Diagnosis not present

## 2018-10-13 DIAGNOSIS — B182 Chronic viral hepatitis C: Secondary | ICD-10-CM | POA: Diagnosis not present

## 2018-10-13 DIAGNOSIS — R1310 Dysphagia, unspecified: Secondary | ICD-10-CM

## 2018-10-13 DIAGNOSIS — R1319 Other dysphagia: Secondary | ICD-10-CM | POA: Insufficient documentation

## 2018-10-13 MED ORDER — PANTOPRAZOLE SODIUM 40 MG PO TBEC: 40.0000 mg | DELAYED_RELEASE_TABLET | Freq: Two times a day (BID) | ORAL | 1 refills | Status: DC

## 2018-10-13 NOTE — Patient Instructions (Signed)
We have sent the following medications to your pharmacy for you to pick up at your convenience: Pantoprazole  Start Fibercon -1  Tablet daily.   Primary Care Referral has been sent.   It has been recommended to you by your physician that you have a(n) Endo/LEC completed. Per your request, we did not schedule the procedure(s) today. Please contact our office at [phone] should you decide to have the procedure completed.   Thank you for choosing me and [facility].  Dr. Rush Landmark

## 2018-10-13 NOTE — Progress Notes (Signed)
[facility] VISIT   Primary Care Provider Patient, No Pcp Per No address on file None  Patient Profile: [name] is a 63 y.o. male with a pmh significant for polysubstance abuse, chronic hepatitis C (s/p treatment F0/F1 fibrosis), IDDM, HTN, MDD, PAD (s/p stenting), prior Osteomyelitis (s/p Toe resection), ?COPD, GERD with Esophagitis, PUD.  The patient presents to the [facility] for an evaluation and management of problem(s) noted below:  Problem List 1. Esophageal dysphagia   2. Acute esophagitis   3. Gastroesophageal reflux disease, esophagitis presence not specified   4. Loose stools   5. Chronic hepatitis C without hepatic coma ([place])   6. Routine general medical examination at a health care facility     I connected with  Chrissie Noa Amy on 10/13/18. I verified that I was speaking with the correct person using two identifiers. Due to the COVID-19 Pandemic, this service was provided via telemedicine using audio only. Interactive audio and video telecommunications were attempted between this provider and patient, however failed, due to patient not having access to video capability and thus to provide timely and excellent care, we continued and completed visit with audio only. The patient was located at a motel room.  The provider was located in the office. The patient did consent to this visit and is aware of charges through their insurance as well as the limitations of evaluation and management by telemedicine. Other persons participating in this telemedicine service were none. Time spent on visit was >25 minutes   History of Present Illness Please see initial GI Inpatient consultation note from May for full details of HPI.  In brief, this is a patient that we were asked to evaluate in the setting of concern for underlying etiology of GI bleeding while patient was intubated.  EGD with results as below showed significant esophagitis and  esophageal ulceration as well as multiple gastric erosions/ulcers.  He eventually stabilized and was able to be discharged after a prolonged hospitalization.  The patient was found on biopsies to have negative viral markers but had some Candida and was treated, while inpatient with antifungal medications.  He was negative for HP based on his biopsies.  He was given BID PPI and discharged after SLP evaluation.  However, he did not take any medications after he was discharged.  He recently was re-admitted to the hospital in the setting of being found down and had overdosed.  GI was not consulted during the admission but was noted to have stable dysphagia.  He was restarted on PPI and then discharged.  He does not have the financial ability to afford all of his medications.  He is not able to stay at his normal home with his family.  He is currently living in a motel.  Interval History Today, as per prior notation, we were able to finally connect with the patient who is currently staying in a Motel.  He is running out of money, but is hopeful for another stimulus check in order to try and have the financial ability to maintain himself.  If he does not get another stimulus check, he is concerned about his ability to survive on his own.  The patient is describing issues of solid food dysphagia.  Liquids are passing well.  However, if he drinks too quickly liquids, he feels that at times he may have to regurgitate this.  He has not taken any PPI since his discharge as he did not pick it up.  He also is not taking any of his BP medications.  He was able to get some Insulin yesterday since his BGs were elevated.  He does not have a PCP unfortunately.  He has other issues that he wants to discuss including issues with his walking and discomfort that he gets in his legs after he tries to walk long distances - describes a history of previous PAD for which he is not following with anyone currently.  He currently denies any  significant NSAID use.  He has been having loose stools for awhile now - predating his hospitalization.  He has significant fecal urgency and if he does not get to the restroom in time, then he will have issues of near emergencies.  No true incontinence per report.  Denies any blood in the stools (maroon or hematochezia or melena).  GI Review of Systems Positive as above including pyrosis Negative for odynophagia, vomiting, bloating  Review of Systems General: Positive for weight loss since onset of his issues in May; denies fevers/chills HEENT: Denies oral lesions currently Cardiovascular: Denies chest pain Pulmonary: SOB stable since his discharge Gastroenterological: See HPI Genitourinary: Denies darkened urine Hematological: Denies easy bruising/bleeding Dermatological: Denies jaundice Psychological: Mood is anxious and scared about issues surrounding drugs   Medications Current Outpatient Medications  Medication Sig Dispense Refill   Accu-Chek Softclix Lancets lancets Use to monitor glucose levels BID; E10.29 100 each 12   amLODipine (NORVASC) 10 MG tablet Take 1 tablet (10 mg total) by mouth daily for 30 days. 30 tablet 0   bacitracin ointment Apply topically 2 (two) times daily. Apply 1/8th of an inch topically to scalp wound twice a day after washing wound with soap & water.  Leave open to air. 120 g 0   Blood Glucose Calibration (ACCU-CHEK AVIVA) SOLN 1 each by In Vitro route as needed. Use to calibrate meter prn 1 each 0   blood glucose meter kit and supplies KIT Dispense based on patient and insurance preference. Use up to four times daily as directed. (FOR ICD-9 250.00, 250.01). 1 each 0   Blood Glucose Monitoring Suppl (ACCU-CHEK AVIVA PLUS) w/Device KIT 1 each by Does not apply route 2 (two) times a day. Use to monitor glucose levels BID; D62.22 1 kit 0   folic acid (FOLVITE) 1 MG tablet Take 1 tablet (1 mg total) by mouth daily. 30 tablet 1   glucose blood  (ACCU-CHEK AVIVA PLUS) test strip Use to monitor glucose levels BID; E10.29 100 each 12   insulin aspart (NOVOLOG) 100 UNIT/ML injection < 120: No insulin 120-150: 1 unit, 150-200: 2 units, 200-250: 3 units 250-300: 4 units, 300-350: 5 units, 350-400: 7 units 10 mL 11   insulin aspart protamine - aspart (NOVOLOG 70/30 MIX) (70-30) 100 UNIT/ML FlexPen Inject 18 units every morning and 18 units every evening. 15 mL 1   Insulin Pen Needle 31G X 5 MM MISC Use with novolog flex pen 100 each 0   Isopropyl Alcohol Wipes 70 % MISC Apply 1 each topically daily as needed. 270 each 0   pantoprazole (PROTONIX) 40 MG tablet Take 1 tablet (40 mg total) by mouth 2 (two) times daily. 60 tablet 1   pregabalin (LYRICA) 50 MG capsule Take 1 capsule (50 mg total) by mouth 2 (two) times daily. 60 capsule 1   risperiDONE (RISPERDAL) 0.5 MG tablet Take 0.5 tablets (0.25 mg total) by mouth 2 (two) times daily. 60 tablet 1   tamsulosin (FLOMAX) 0.4 MG CAPS capsule  Take 1 capsule (0.4 mg total) by mouth daily after supper. 30 capsule 1   No current facility-administered medications for this visit.     Allergies Allergies  Allergen Reactions   Codeine Itching    Tolerates hydrocodone/apap    Histories Past Medical History:  Diagnosis Date   DEPRESSION    DIABETES MELLITUS, TYPE I    DRUG ABUSE    pt should have NO controlled substances rx'ed   Glaucoma    HEPATITIS C    chronic   Heroin overdose ([place]) 10/03/2018   Heroin use 10/03/2018   Hypertension 02/18/2011   Proliferative diabetic retinopathy(362.02)    Vitiligo    Past Surgical History:  Procedure Laterality Date   ABDOMINAL AORTOGRAM W/LOWER EXTREMITY N/A 07/25/2016   Procedure: Abdominal Aortogram w/Bilateral Lower Extremity Runoff;  Surgeon: Conrad Pinos Altos, MD;  Location: [facility];  Service: Cardiovascular;  Laterality: N/A;   ABDOMINAL AORTOGRAM W/LOWER EXTREMITY N/A 12/24/2016   Procedure: ABDOMINAL AORTOGRAM  W/LOWER EXTREMITY;  Surgeon: Serafina Mitchell, MD;  Location: [facility];  Service: Cardiovascular;  Laterality: N/A;   AMPUTATION TOE Right 07/26/2016   Procedure: AMPUTATION GREAT TOE;  Surgeon: Serafina Mitchell, MD;  Location: [address];  Service: Vascular;  Laterality: Right;   BIOPSY  08/25/2018   Procedure: BIOPSY;  Surgeon: Irving Copas., MD;  Location: [facility];  Service: Gastroenterology;;   ESOPHAGOGASTRODUODENOSCOPY N/A 08/25/2018   Procedure: ESOPHAGOGASTRODUODENOSCOPY (EGD);  Surgeon: Irving Copas., MD;  Location: [facility];  Service: Gastroenterology;  Laterality: N/A;   EYE SURGERY     retinal surgery x 2, right eye   INSERTION OF ILIAC STENT Right 07/26/2016   Procedure: INSERTION OF RIGHT POPLITEAL STENT WITH BALLOON ANGIOPLASTY;  Surgeon: Serafina Mitchell, MD;  Location: [facility];  Service: Vascular;  Laterality: Right;   LOWER EXTREMITY ANGIOGRAM Right 07/26/2016   Procedure: LOWER EXTREMITY ANGIOGRAM;  Surgeon: Serafina Mitchell, MD;  Location: [facility] [facility];  Service: Vascular;  Laterality: Right;   PERIPHERAL VASCULAR BALLOON ANGIOPLASTY Right 07/26/2016   Procedure: PERIPHERAL VASCULAR BALLOON ANGIOPLASTY  RIGHT ANTERIOR TIBEAL ARTERY AND RIGHT SUPERFICIAL FEMORAL ARTERY;  Surgeon: Serafina Mitchell, MD;  Location: [facility];  Service: Vascular;  Laterality: Right;   PERIPHERAL VASCULAR INTERVENTION Right 12/24/2016   Procedure: PERIPHERAL VASCULAR INTERVENTION;  Surgeon: Serafina Mitchell, MD;  Location: [facility];  Service: Cardiovascular;  Laterality: Right;  lower extr   Social History   Socioeconomic History   Marital status: Single    Spouse name: Not on file   Number of children: 2   Years of education: 14   Highest education level: Associate degree: occupational, Hotel manager, or vocational program  Occupational History    Employer: UNEMPLOYED    Comment: disabled  Scientist, product/process development strain: Very hard   Food insecurity     Worry: Often true    Inability: Often true   Transportation needs    Medical: Yes    Non-medical: Yes  Tobacco Use   Smoking status: Current Every Day Smoker    Packs/day: 1.00    Years: 51.00    Pack years: 51.00    Types: Cigarettes   Smokeless tobacco: Former Systems developer    Types: Chew    Quit date: 1985  Substance and Sexual Activity   Alcohol use: No    Alcohol/week: 0.0 standard drinks    Comment: 08/14/14 none   Drug use: Yes    Types:  Marijuana, Heroin, Cocaine, "Crack" cocaine, Fentanyl, Methylphenidate    Comment: s/p rehab x 6, occasional drug use (per pt last  use was 06/2015)   Sexual activity: Not Currently  Lifestyle   Physical activity    Days per week: 0 days    Minutes per session: 0 min   Stress: Very much  Relationships   Social connections    Talks on phone: Never    Gets together: Once a week    Attends religious service: Never    Active member of club or organization: No    Attends meetings of clubs or organizations: Not on file    Relationship status: Never married   Intimate partner violence    Fear of current or ex partner: No    Emotionally abused: No    Physically abused: No    Forced sexual activity: No  Other Topics Concern   Not on file  Social History Narrative   Lives in boarding house/homeless   Ongoing occ drug use-crack   Single, disabled, prev mental health drug counselor.    Family History  Problem Relation Age of Onset   Diabetes Father    Kidney disease Father    Arthritis Mother    Heart disease Mother        CAD   Colon cancer Neg Hx    Esophageal cancer Neg Hx    Inflammatory bowel disease Neg Hx    Liver disease Neg Hx    Pancreatic cancer Neg Hx    Rectal cancer Neg Hx    Stomach cancer Neg Hx    I have reviewed his medical, social, and family history in detail and updated the electronic medical record as necessary.    PHYSICAL EXAMINATION  Telehealth visit   REVIEW OF DATA  I reviewed  the following data at the time of this encounter:  GI Procedures and Studies  May 2020 EGD - Congested, discolored, erythematous, friable (with spontaneous bleeding), granular, hemorrhagic appearing, inflamed, texture changed, ulcerated mucosa in the esophagus. Black esophagus in portions. Biopsied. - Esophageal ulcer vs healing MW tear at distal esophagus.. - Non-bleeding gastric ulcers with a clean ulcer base ([place] Class III). - Discolored, flattened, friable (with spontaneous bleeding), inflamed, smooth and texture changed mucosa in the gastric body. Query some ischemic changes. - Erosive gastropathy. Biopsied for HP. - Erythematous duodenopathy. Diagnosis 1. Stomach, biopsy - GASTRIC ANTRAL AND OXYNTIC MUCOSA SHOWING NONSPECIFIC REACTIVE GASTROPATHY WITH EROSION. SEE NOTE - WARTHIN STARRY STAIN IS NEGATIVE FOR HELICOBACTER PYLORI 2. Esophagus, biopsy - SEVERE ACUTE ESOPHAGITIS WITH ULCERATION. SEE NOTE Diagnosis Note 1. Differential diagnosis includes drug/toxin-induced injury and pancreatobiliary reflux. Ischemia can show similar histologic changes and cannot be ruled out. 2. Differential diagnosis includes reflux, medication-injury, infection etc. Fungal organisms or viral cytopathic changes are not seen . GMS special stain and immunohistochemical stain for HSV are pending and will be reported in an Addendum 2. GMS stain shows mostly yeast and few hyphae, consistent with candidal esophagitis. Immunohistochemical stain for HSV is negative   May 2020 SLP evaluation  Pt has a mild-moderate oropharyngeal dysphagia that appears to be near his baseline in comparison to prior MBS studies in 2019. His oral phase is impacted by his poor dentition, with prolonged mastication. He also has decreased bolus cohesion and oral residue that increases with solids. Fluctuating timing of swallow initiation leads to aspiration before the swallow with thin liquids via straw with variable sensation.  Chin tuck actually increased the volume aspirated. He  had improved airway protection with cup sips and solids. Vallecular residue decreases as boluses become more solid, suspicious for esophageal component particularly in pt with known GER and esophageal issues. Recommend starting Dys 3 diet and thin liquids via cup only.  Laboratory Studies  Reviewed in epic  Imaging Studies  No relevant studies to review    ASSESSMENT  Mr. Dymek is a 63 y.o. male with a pmh significant for polysubstance abuse, chronic hepatitis C (s/p treatment F0/F1 fibrosis), IDDM, HTN, MDD, PAD (s/p stenting), prior Osteomyelitis (s/p Toe resection), ?COPD, GERD with Esophagitis, PUD.  The patient is seen today for evaluation and management of:  1. Esophageal dysphagia   2. Acute esophagitis   3. Gastroesophageal reflux disease, esophagitis presence not specified   4. Loose stools   5. Chronic hepatitis C without hepatic coma ([place])   6. Routine general medical examination at a health care facility    Multiple medical issues that we will have to work with him over the course of the coming months.  Most importantly are his issues with his esophagitis and GERD and dysphagia.  Has not been on any adequate PPI therapy for the last 6 weeks.  We have sent a new prescription in and allow him to see how he feels with being added on.  I am is concerned that he will develop a stricture as a result of the cera fear esophagitis if we do not get him on acid reducing medications as soon as possible things could become much worse.  He will likely require an EGD with dilation however, I need him to be on some sort of acid medicine for a period in time.  If he cannot afford the medication that we are sending over (Protonix 40 mg twice daily) we will see if we have any samples that we may be able to give him or will need to try and get a good Rx plan for a PPI.  We will tentatively plan an EGD with dilation in 6 weeks.  I like to see how he is  doing in approximately 3 to 4 weeks.  In regards to his loose stools and fecal urgency he may require a colonoscopy at some point in time but we can discuss things further about his bowel habits at his next visit when he is no longer living in a motel as his primary source of housing.  He may benefit from a diagnostic/screening colonoscopy which we can talk more about at coming visit.  He will initiate FiberCon once daily in order of trying to solidify his stools a bit more.  In regards to his chronic hepatitis C which was previously treated in 2000 18/2019 he had a negative HCVRNA in 2019.  I would like to consider repeating some laboratories at his next visit including his hepatitis C virus viral load to ensure he has had a sustained virologic response.  He will also benefit from hepatitis B immunization in the future as his laboratories were not consistent with hepatitis B immunity when last checked in 2018.  I am very concerned about the patient's overall issues from a total body perspective and I am placing a referral to primary care so that we can try and get the patient established with a provider to help think about him globally.  He is not taking any of his other medications other than his insulin due to issues of being able to afford his medicines.  He will benefit from consideration of social  work referral after he is established with a primary care provider.  The risks and benefits of endoscopic evaluation were discussed with the patient; these include but are not limited to the risk of perforation, infection, bleeding, missed lesions, lack of diagnosis, severe illness requiring hospitalization, as well as anesthesia and sedation related illnesses.  The patient is agreeable to proceed when we feel necessary.  All patient questions were answered, to the best of my ability, and the patient agrees to the aforementioned plan of action with follow-up as indicated.   PLAN  Referral for new PCP BID PPI Rx  to be sent to pharmacy - if issues obtaining then he will call so that we can find another route or method of getting medication Small sips and chew thoroughly currently Likely to require EGD with dilation in coming weeks after PPI therapy on-board (will move forward with looking for a spot) Will discuss things further in regards to follow up from HCV perspective Will need to consider HBV vaccine series Start Fibercon once to twice daily to help with stools Will consider role of Colonoscopy in future (at next visit will determine need with repeat EGD that will be required)   Orders Placed This Encounter  Procedures   Amb[facility] referral to [facility]    New Prescriptions   No medications on file   Modified Medications   Modified Medication Previous Medication   PANTOPRAZOLE (PROTONIX) 40 MG TABLET pantoprazole (PROTONIX) 40 MG tablet      Take 1 tablet (40 mg total) by mouth 2 (two) times daily.    Take 1 tablet (40 mg total) by mouth 2 (two) times daily.    Planned Follow Up Return in about 4 weeks (around 11/10/2018).   Justice Britain, MD [facility] Office # [phone]

## 2018-10-13 NOTE — Progress Notes (Signed)
Initially called the patient's only phone number and his son picked up. There has been some disagreement within the family (not with the son) but the patient is now living at a motel. Patient son is going to try to reach the patient so that we can try and do a telehealth visit. Awaiting callback. If I have not heard anything by around 845 then will have the patient's son called again and try and work on rescheduling a clinic visit.   Justice Britain, MD [facility] Office # [phone]

## 2018-10-19 NOTE — Discharge Summary (Signed)
°Discharge Summary ° °[name] MRN:[id number] DOB: 07/09/1955 ° °PCP: Patient, No Pcp Per ° °Admit date: 10/03/2018 °Discharge date: 10/11/2018 ° °Time spent: 25 minutes  ° °Recommendations for Outpatient Follow-up:  °1. Insulin 70/30 changed to 18 units twice a day ° °Discharge Diagnoses:  °Active Hospital Problems  ° Diagnosis Date Noted  °• Left lower lobe pneumonia ([name] 10/07/2018  °• Hypoglycemia associated with diabetes ([name] 08/12/2018  °• Acute metabolic encephalopathy 02/04/2018  °• Hypoglycemia 12/24/2017  °• Syncope and collapse 12/23/2017  °• AKI (acute kidney injury) ([name] 04/18/2016  °• PAD (peripheral artery disease) ([name] 04/29/2013  °• Diabetic neuropathy ([name] 06/21/2010  °• Substance abuse ([name] 04/23/2007  °  °Resolved Hospital Problems  °No resolved problems to display.  ° ° °Discharge Condition: Improved, being discharged to home ° °Diet recommendation: Carb modified, heart healthy ° °Vitals:  ° 10/11/18 0516 10/11/18 1009  °BP: (!) 146/78 (!) 144/62  °Pulse: 77 81  °Resp: 14 20  °Temp: 98.1 °F (36.7 °C) 98.1 °F (36.7 °C)  °SpO2: 97% 99%  ° ° °History of present illness:  ° [name] is a 63 y.o. year old male with medical history significant for diabetes insulin-dependent, depression, hepatitis C, hypertension, polysubstance abuse with overdose  who presented on 10/03/2018 with altered mental status after being found down lying on his face by family surrounded by drug paraphernalia. He was required narcan and dextrose by EMS due to profound hypoglycemia. ° °Hospital Course:  °Hospitalization noted for episodes of hypoglycemia.  Seen by speech and swallowing adjusted for dysphagia 3.  Patient with no complaints other than getting used to dysphagia 3 diet. ° °Active Problems: °  Substance abuse ([name] °  Diabetic neuropathy ([name] °  PAD (peripheral artery disease) ([name] °  AKI (acute kidney injury) ([name] °  Syncope and collapse °  Hypoglycemia °  Acute metabolic encephalopathy °  Hypoglycemia  associated with diabetes ([name] °  Left lower lobe pneumonia ([name] °1. Acute metabolic encephalopathy, resolved.  Multifactorial etiology: fentanyl overdose , hypoglycemia, pneumonia.  Continue antibiotics, close CBG monitoring, polysubstance abuse cessation/counseling provided °2. Unwitnessed syncope.  Likely related to admitted fentanyl use/overdose in addition to hypoglycemia. °3. Hypoglycemia, resolved.  Nadir glucose of 35 on admission. Recurred  on 6/25 as patient avoided food after having a choking episode relate to his dysphagia, dropped to 30 and responded to D50.  CBGs relatively stable.  Have blood sugar of 85 today.  Will stop scheduled NovoLog at meals °4. Left lower lobe pneumonia, stable on room air, likely aspiration pneumonia related to syncopal event.  Completed 7 day course of antibiotics ( zoysn x 4, augmentin x3 days) °5. AKI, mild.  Resolved.  Back to baseline from peak of 1.3 after timed IVF.  Now back to baseline °6. Dysphagia. MBS concerning for esophageal component. EGD in 5/20 for evaluation of melena showed esophageal ulcers, PPI, carafate, ° °Procedures: °· None  ° °Consultations: °· None  ° °Discharge Exam: °BP (!) 144/62 (BP Location: Right Arm)    Pulse 81    Temp 98.1 °F (36.7 °C) (Oral)    Resp 20    Ht 6' (1.829 m)    Wt 61.8 kg    SpO2 99%    BMI 18.48 kg/m²  ° °General: Alert & oriented x 3  °Cardiovascular: Regular rate & rhythm, S1 S2  °Respiratory: Clear to auscultation bilaterally  ° °Discharge Instructions °You were cared for by a hospitalist during your hospital stay. If you have   have any questions about your discharge medications or the care you received while you were in the hospital after you are discharged, you can call the unit and asked to speak with the hospitalist on call if the hospitalist that took care of you is not available. Once you are discharged, your primary care physician will handle any further medical issues. Please note that NO REFILLS for any discharge  medications will be authorized once you are discharged, as it is imperative that you return to your primary care physician (or establish a relationship with a primary care physician if you do not have one) for your aftercare needs so that they can reassess your need for medications and monitor your lab values.  Discharge Instructions    Diet - low sodium heart healthy   Complete by: As directed    Increase activity slowly   Complete by: As directed      Allergies as of 10/11/2018      Reactions   Codeine Itching   Tolerates hydrocodone/apap      Medication List    STOP taking these medications   feeding supplement (ENSURE ENLIVE) Liqd   nicotine 21 mg/24hr patch Commonly known as: NICODERM CQ - dosed in mg/24 hours     TAKE these medications   Accu-Chek Aviva Plus w/Device Kit 1 each by Does not apply route 2 (two) times a day. Use to monitor glucose levels BID; E10.29 Notes to patient: 10/11/18   Accu-Chek Aviva Soln 1 each by In Vitro route as needed. Use to calibrate meter prn Notes to patient: 10/11/18   Accu-Chek Softclix Lancets lancets Use to monitor glucose levels BID; E10.29 Notes to patient: 10/11/18   amLODipine 10 MG tablet Commonly known as: NORVASC Take 1 tablet (10 mg total) by mouth daily for 30 days. Notes to patient: 10/12/18 @ 10:00 am    bacitracin ointment Apply topically 2 (two) times daily. Apply 1/8th of an inch topically to scalp wound twice a day after washing wound with soap & water.  Leave open to air. Notes to patient: 10/11/18 @ 10:00 pm    blood glucose meter kit and supplies Kit Dispense based on patient and insurance preference. Use up to four times daily as directed. (FOR ICD-9 250.00, 250.01). Notes to patient: 1/49/70   folic acid 1 MG tablet Commonly known as: FOLVITE Take 1 tablet (1 mg total) by mouth daily. Notes to patient: 10/12/18   glucose blood test strip Commonly known as: Accu-Chek Aviva Plus Use to monitor glucose  levels BID; E10.29 Notes to patient: 10/11/18   insulin aspart 100 UNIT/ML injection Commonly known as: novoLOG < 120: No insulin 120-150: 1 unit, 150-200: 2 units, 200-250: 3 units 250-300: 4 units, 300-350: 5 units, 350-400: 7 units Notes to patient: 10/11/18 @ 5:00 pm    insulin aspart protamine - aspart (70-30) 100 UNIT/ML FlexPen Commonly known as: NOVOLOG 70/30 MIX Inject 18 units every morning and 18 units every evening. What changed: additional instructions Notes to patient: 10/11/18   Insulin Pen Needle 31G X 5 MM Misc Use with novolog flex pen Notes to patient: 10/11/18   Isopropyl Alcohol Wipes 70 % Misc Apply 1 each topically daily as needed.   pregabalin 50 MG capsule Commonly known as: LYRICA Take 1 capsule (50 mg total) by mouth 2 (two) times daily. Notes to patient: 10/11/18 @ 10:00 pm    risperiDONE 0.5 MG tablet Commonly known as: RISPERDAL Take 0.5 tablets (0.25 mg total) by mouth 2 (  times daily. °Notes to patient: 10/11/18 @ 10:00 pm  °  °tamsulosin 0.4 MG Caps capsule °Commonly known as: FLOMAX °Take 1 capsule (0.4 mg total) by mouth daily after supper. °Notes to patient: 10/11/18 @ 6:00 pm  °  °  ° °Allergies  °Allergen Reactions  °• Codeine Itching  °  Tolerates hydrocodone/apap  ° °Follow-up Information   ° [facility]   °Why: agency will provide home health physical therapy, occupational therapy, aide, and social work. agency will call you to schedule initial visit.  °Contact information: °[phone] ° °  °  °  ° ° ° °The results of significant diagnostics from this hospitalization (including imaging, microbiology, ancillary and laboratory) are listed below for reference.   ° °Significant Diagnostic Studies: °Dg Chest 2 View ° °Result Date: 09/19/2018 °CLINICAL DATA:  Neuropathy, nausea, vomiting EXAM: CHEST - 2 VIEW COMPARISON:  09/05/2018 FINDINGS: Cardiomegaly. There is persistent although improved heterogeneous airspace opacity of  the left lung base compared to prior examination. IMPRESSION: Cardiomegaly. There is persistent although improved heterogeneous airspace opacity of the left lung base compared to prior examination, in keeping with resolving infection or aspiration. Electronically Signed   By: Alex  Bibbey M.D.   On: 09/19/2018 19:00  ° °Dg Chest Portable 1 View ° °Result Date: 10/03/2018 °CLINICAL DATA:  Chills EXAM: PORTABLE CHEST 1 VIEW COMPARISON:  09/19/2018, 02/09/2018, 08/23/2018, 08/12/2018 FINDINGS: Persistent airspace opacity at the left base. This is a waxing and waning finding. Normal heart size. No pleural effusion. Old left rib fractures. IMPRESSION: Patchy airspace disease at the left lung base intermittently visualized on multiple priors, possible recurrent pneumonia or aspiration Electronically Signed   By: [name]  Fujinaga M.D.   On: 10/03/2018 19:42  ° °Vas Us Abi With/wo Tbi ° °Result Date: 10/09/2018 °LOWER EXTREMITY DOPPLER STUDY Indications: Ulceration, and peripheral artery disease. High Risk Factors: Diabetes.  Vascular Interventions: Angioplasty- Rt anterior tibial artery,superficial                         femoral artery and popliteal artery stent 07/26/2016. Limitations: Right great toe amputation Comparison Study: Comparison study available 08/27/2016 Performing Technologist: Slaughter, [place] RVS  Examination Guidelines: A complete evaluation includes at minimum, Doppler waveform signals and systolic blood pressure reading at the level of bilateral brachial, anterior tibial, and posterior tibial arteries, when vessel segments are accessible. Bilateral testing is considered an integral part of a complete examination. Photoelectric Plethysmograph (PPG) waveforms and toe systolic pressure readings are included as required and additional duplex testing as needed. Limited examinations for reoccurring indications may be performed as noted.  ABI Findings:  +---------+------------------+-----+----------+---------------+  Right     Rt Pressure (mmHg) Index Waveform   Comment          +---------+------------------+-----+----------+---------------+  Brachial  142                      triphasic                   +---------+------------------+-----+----------+---------------+  PTA       125                0.88  monophasic sounds biphasic  +---------+------------------+-----+----------+---------------+  DP        120                0.85  biphasic                    +---------+------------------+-----+----------+---------------+    Great Toe                                     Amputated        +---------+------------------+-----+----------+---------------+ +---------+------------------+-----+-------------------+---------+  Left      Lt Pressure (mmHg) Index Waveform            Comment    +---------+------------------+-----+-------------------+---------+  Brachial  138                      triphasic                      +---------+------------------+-----+-------------------+---------+  PTA       43                 0.30  dampened monophasic            +---------+------------------+-----+-------------------+---------+  DP        255                1.80  dampened monophasic Calcified  +---------+------------------+-----+-------------------+---------+  Great Toe 33                 0.23                                 +---------+------------------+-----+-------------------+---------+ +-------+-----------+-------------------+----------------+-------------------+  ABI/TBI Today's ABI Today's TBI         Previous ABI     Previous TBI         +-------+-----------+-------------------+----------------+-------------------+  Right   0.88        Amputated Great Toe 0.94             Amputated Great Toe  +-------+-----------+-------------------+----------------+-------------------+  Left    Calcified   0.23                Non compressible 0.47                  +-------+-----------+-------------------+----------------+-------------------+ Right ABIs appear essentially unchanged. Left ABIs and TBIs appear decreased. Unable to obtain the right TBI due to amputation of the reat toe 07/26/2016  Summary: Right: Resting right ankle-brachial index indicates mild right lower extremity arterial disease. Left: Resting left ankle-brachial index indicates noncompressible left lower extremity arteries.The left toe-brachial index is abnormal.  *See table(s) above for measurements and observations.  Electronically signed by Todd Early MD on 10/09/2018 at 3:52:14 PM.    Final   ° ° °Microbiology: °No results found for this or any previous visit (from the past 240 hour(s)).  ° °Labs: °Basic Metabolic Panel: °No results for input(s): NA, K, CL, CO2, GLUCOSE, BUN, CREATININE, CALCIUM, MG, PHOS in the last 168 hours. °Liver Function Tests: °No results for input(s): AST, ALT, ALKPHOS, BILITOT, PROT, ALBUMIN in the last 168 hours. °No results for input(s): LIPASE, AMYLASE in the last 168 hours. °No results for input(s): AMMONIA in the last 168 hours. °CBC: °No results for input(s): WBC, NEUTROABS, HGB, HCT, MCV, PLT in the last 168 hours. °Cardiac Enzymes: °No results for input(s): CKTOTAL, CKMB, CKMBINDEX, TROPONINI in the last 168 hours. °BNP: °BNP (last 3 results) °No results for input(s): BNP in the last 8760 hours. ° °ProBNP (last 3 results) °No results for input(s): PROBNP in the last 8760 hours. ° °CBG: °No results for input(s): GLUCAP in the last 168 hours. ° ° ° ° °  Signed:  Annita Brod, MD [name] 10/19/2018, 12:26 PM

## 2018-11-11 ENCOUNTER — Emergency Department (HOSPITAL_COMMUNITY): Payer: Medicare HMO

## 2018-11-11 ENCOUNTER — Inpatient Hospital Stay (HOSPITAL_COMMUNITY): Payer: Medicare HMO

## 2018-11-11 ENCOUNTER — Inpatient Hospital Stay (HOSPITAL_COMMUNITY)
Admission: EM | Admit: 2018-11-11 | Discharge: 2018-11-24 | DRG: 637 | Disposition: A | Discharge status: Skilled Nursing Facility | Payer: Medicare HMO | Attending: Family Medicine | Admitting: Family Medicine

## 2018-11-11 DIAGNOSIS — E103599 Type 1 diabetes mellitus with proliferative diabetic retinopathy without macular edema, unspecified eye: Secondary | ICD-10-CM | POA: Diagnosis present

## 2018-11-11 DIAGNOSIS — Z794 Long term (current) use of insulin: Secondary | ICD-10-CM

## 2018-11-11 DIAGNOSIS — F19188 Other psychoactive substance abuse with other psychoactive substance-induced disorder: Secondary | ICD-10-CM | POA: Diagnosis not present

## 2018-11-11 DIAGNOSIS — E1110 Type 2 diabetes mellitus with ketoacidosis without coma: Secondary | ICD-10-CM | POA: Diagnosis present

## 2018-11-11 DIAGNOSIS — N1330 Unspecified hydronephrosis: Secondary | ICD-10-CM | POA: Diagnosis not present

## 2018-11-11 DIAGNOSIS — K31819 Angiodysplasia of stomach and duodenum without bleeding: Secondary | ICD-10-CM | POA: Diagnosis present

## 2018-11-11 DIAGNOSIS — N320 Bladder-neck obstruction: Secondary | ICD-10-CM | POA: Diagnosis present

## 2018-11-11 DIAGNOSIS — L89899 Pressure ulcer of other site, unspecified stage: Secondary | ICD-10-CM | POA: Diagnosis present

## 2018-11-11 DIAGNOSIS — E1165 Type 2 diabetes mellitus with hyperglycemia: Secondary | ICD-10-CM | POA: Diagnosis not present

## 2018-11-11 DIAGNOSIS — E871 Hypo-osmolality and hyponatremia: Secondary | ICD-10-CM | POA: Diagnosis not present

## 2018-11-11 DIAGNOSIS — Z8719 Personal history of other diseases of the digestive system: Secondary | ICD-10-CM | POA: Diagnosis not present

## 2018-11-11 DIAGNOSIS — F339 Major depressive disorder, recurrent, unspecified: Secondary | ICD-10-CM | POA: Diagnosis not present

## 2018-11-11 DIAGNOSIS — Z590 Homelessness: Secondary | ICD-10-CM

## 2018-11-11 DIAGNOSIS — K222 Esophageal obstruction: Secondary | ICD-10-CM | POA: Diagnosis present

## 2018-11-11 DIAGNOSIS — Z833 Family history of diabetes mellitus: Secondary | ICD-10-CM

## 2018-11-11 DIAGNOSIS — R4182 Altered mental status, unspecified: Secondary | ICD-10-CM

## 2018-11-11 DIAGNOSIS — F4325 Adjustment disorder with mixed disturbance of emotions and conduct: Secondary | ICD-10-CM | POA: Diagnosis not present

## 2018-11-11 DIAGNOSIS — E1011 Type 1 diabetes mellitus with ketoacidosis with coma: Secondary | ICD-10-CM | POA: Diagnosis not present

## 2018-11-11 DIAGNOSIS — R45851 Suicidal ideations: Secondary | ICD-10-CM | POA: Diagnosis not present

## 2018-11-11 DIAGNOSIS — E1040 Type 1 diabetes mellitus with diabetic neuropathy, unspecified: Secondary | ICD-10-CM | POA: Diagnosis present

## 2018-11-11 DIAGNOSIS — G9341 Metabolic encephalopathy: Secondary | ICD-10-CM | POA: Diagnosis not present

## 2018-11-11 DIAGNOSIS — W19XXXA Unspecified fall, initial encounter: Secondary | ICD-10-CM | POA: Diagnosis present

## 2018-11-11 DIAGNOSIS — A419 Sepsis, unspecified organism: Secondary | ICD-10-CM | POA: Diagnosis not present

## 2018-11-11 DIAGNOSIS — R2689 Other abnormalities of gait and mobility: Secondary | ICD-10-CM | POA: Diagnosis not present

## 2018-11-11 DIAGNOSIS — K209 Esophagitis, unspecified: Secondary | ICD-10-CM | POA: Diagnosis not present

## 2018-11-11 DIAGNOSIS — S2242XA Multiple fractures of ribs, left side, initial encounter for closed fracture: Secondary | ICD-10-CM | POA: Diagnosis present

## 2018-11-11 DIAGNOSIS — I959 Hypotension, unspecified: Secondary | ICD-10-CM | POA: Diagnosis not present

## 2018-11-11 DIAGNOSIS — Z598 Other problems related to housing and economic circumstances: Secondary | ICD-10-CM | POA: Diagnosis not present

## 2018-11-11 DIAGNOSIS — I10 Essential (primary) hypertension: Secondary | ICD-10-CM | POA: Diagnosis present

## 2018-11-11 DIAGNOSIS — Z89411 Acquired absence of right great toe: Secondary | ICD-10-CM

## 2018-11-11 DIAGNOSIS — R404 Transient alteration of awareness: Secondary | ICD-10-CM | POA: Diagnosis not present

## 2018-11-11 DIAGNOSIS — N179 Acute kidney failure, unspecified: Secondary | ICD-10-CM | POA: Diagnosis present

## 2018-11-11 DIAGNOSIS — J189 Pneumonia, unspecified organism: Secondary | ICD-10-CM

## 2018-11-11 DIAGNOSIS — M79671 Pain in right foot: Secondary | ICD-10-CM | POA: Diagnosis present

## 2018-11-11 DIAGNOSIS — E875 Hyperkalemia: Secondary | ICD-10-CM

## 2018-11-11 DIAGNOSIS — R1310 Dysphagia, unspecified: Secondary | ICD-10-CM | POA: Diagnosis not present

## 2018-11-11 DIAGNOSIS — R739 Hyperglycemia, unspecified: Secondary | ICD-10-CM | POA: Diagnosis not present

## 2018-11-11 DIAGNOSIS — E860 Dehydration: Secondary | ICD-10-CM | POA: Diagnosis present

## 2018-11-11 DIAGNOSIS — R52 Pain, unspecified: Secondary | ICD-10-CM

## 2018-11-11 DIAGNOSIS — E876 Hypokalemia: Secondary | ICD-10-CM | POA: Diagnosis not present

## 2018-11-11 DIAGNOSIS — R0682 Tachypnea, not elsewhere classified: Secondary | ICD-10-CM | POA: Diagnosis present

## 2018-11-11 DIAGNOSIS — R456 Violent behavior: Secondary | ICD-10-CM | POA: Diagnosis not present

## 2018-11-11 DIAGNOSIS — Z20828 Contact with and (suspected) exposure to other viral communicable diseases: Secondary | ICD-10-CM | POA: Diagnosis not present

## 2018-11-11 DIAGNOSIS — L89153 Pressure ulcer of sacral region, stage 3: Secondary | ICD-10-CM | POA: Diagnosis present

## 2018-11-11 DIAGNOSIS — R000 Tachycardia, unspecified: Secondary | ICD-10-CM | POA: Diagnosis not present

## 2018-11-11 DIAGNOSIS — I160 Hypertensive urgency: Secondary | ICD-10-CM | POA: Diagnosis present

## 2018-11-11 DIAGNOSIS — B3781 Candidal esophagitis: Secondary | ICD-10-CM | POA: Diagnosis present

## 2018-11-11 DIAGNOSIS — E1311 Other specified diabetes mellitus with ketoacidosis with coma: Secondary | ICD-10-CM

## 2018-11-11 DIAGNOSIS — E10649 Type 1 diabetes mellitus with hypoglycemia without coma: Secondary | ICD-10-CM | POA: Diagnosis not present

## 2018-11-11 DIAGNOSIS — K2210 Ulcer of esophagus without bleeding: Secondary | ICD-10-CM | POA: Diagnosis not present

## 2018-11-11 DIAGNOSIS — D649 Anemia, unspecified: Secondary | ICD-10-CM | POA: Diagnosis present

## 2018-11-11 DIAGNOSIS — R410 Disorientation, unspecified: Secondary | ICD-10-CM | POA: Diagnosis not present

## 2018-11-11 DIAGNOSIS — M6281 Muscle weakness (generalized): Secondary | ICD-10-CM | POA: Diagnosis not present

## 2018-11-11 DIAGNOSIS — B182 Chronic viral hepatitis C: Secondary | ICD-10-CM | POA: Diagnosis present

## 2018-11-11 DIAGNOSIS — E1010 Type 1 diabetes mellitus with ketoacidosis without coma: Secondary | ICD-10-CM | POA: Diagnosis not present

## 2018-11-11 DIAGNOSIS — Z8249 Family history of ischemic heart disease and other diseases of the circulatory system: Secondary | ICD-10-CM

## 2018-11-11 DIAGNOSIS — N132 Hydronephrosis with renal and ureteral calculous obstruction: Secondary | ICD-10-CM | POA: Diagnosis not present

## 2018-11-11 DIAGNOSIS — G319 Degenerative disease of nervous system, unspecified: Secondary | ICD-10-CM | POA: Diagnosis present

## 2018-11-11 DIAGNOSIS — R9082 White matter disease, unspecified: Secondary | ICD-10-CM | POA: Diagnosis present

## 2018-11-11 DIAGNOSIS — F1910 Other psychoactive substance abuse, uncomplicated: Secondary | ICD-10-CM | POA: Diagnosis not present

## 2018-11-11 DIAGNOSIS — E870 Hyperosmolality and hypernatremia: Secondary | ICD-10-CM | POA: Diagnosis not present

## 2018-11-11 DIAGNOSIS — R6510 Systemic inflammatory response syndrome (SIRS) of non-infectious origin without acute organ dysfunction: Secondary | ICD-10-CM | POA: Diagnosis present

## 2018-11-11 DIAGNOSIS — R4020 Unspecified coma: Secondary | ICD-10-CM | POA: Diagnosis not present

## 2018-11-11 DIAGNOSIS — I252 Old myocardial infarction: Secondary | ICD-10-CM

## 2018-11-11 DIAGNOSIS — Z7401 Bed confinement status: Secondary | ICD-10-CM | POA: Diagnosis not present

## 2018-11-11 DIAGNOSIS — B370 Candidal stomatitis: Secondary | ICD-10-CM | POA: Diagnosis present

## 2018-11-11 DIAGNOSIS — R6520 Severe sepsis without septic shock: Secondary | ICD-10-CM | POA: Diagnosis not present

## 2018-11-11 DIAGNOSIS — J449 Chronic obstructive pulmonary disease, unspecified: Secondary | ICD-10-CM | POA: Diagnosis not present

## 2018-11-11 DIAGNOSIS — R7989 Other specified abnormal findings of blood chemistry: Secondary | ICD-10-CM | POA: Diagnosis present

## 2018-11-11 DIAGNOSIS — N3289 Other specified disorders of bladder: Secondary | ICD-10-CM | POA: Diagnosis present

## 2018-11-11 DIAGNOSIS — R836 Abnormal cytological findings in cerebrospinal fluid: Secondary | ICD-10-CM | POA: Diagnosis not present

## 2018-11-11 DIAGNOSIS — R509 Fever, unspecified: Secondary | ICD-10-CM | POA: Diagnosis present

## 2018-11-11 DIAGNOSIS — T383X5A Adverse effect of insulin and oral hypoglycemic [antidiabetic] drugs, initial encounter: Secondary | ICD-10-CM | POA: Diagnosis not present

## 2018-11-11 DIAGNOSIS — S90851A Superficial foreign body, right foot, initial encounter: Secondary | ICD-10-CM | POA: Diagnosis not present

## 2018-11-11 DIAGNOSIS — Z841 Family history of disorders of kidney and ureter: Secondary | ICD-10-CM

## 2018-11-11 DIAGNOSIS — J80 Acute respiratory distress syndrome: Secondary | ICD-10-CM | POA: Diagnosis not present

## 2018-11-11 DIAGNOSIS — M2550 Pain in unspecified joint: Secondary | ICD-10-CM | POA: Diagnosis not present

## 2018-11-11 DIAGNOSIS — E160 Drug-induced hypoglycemia without coma: Secondary | ICD-10-CM | POA: Diagnosis not present

## 2018-11-11 LAB — CBG MONITORING, ED
Glucose-Capillary: 102 mg/dL — ABNORMAL HIGH (ref 70–99)
Glucose-Capillary: 106 mg/dL — ABNORMAL HIGH (ref 70–99)
Glucose-Capillary: 127 mg/dL — ABNORMAL HIGH (ref 70–99)
Glucose-Capillary: 180 mg/dL — ABNORMAL HIGH (ref 70–99)
Glucose-Capillary: 308 mg/dL — ABNORMAL HIGH (ref 70–99)
Glucose-Capillary: 383 mg/dL — ABNORMAL HIGH (ref 70–99)
Glucose-Capillary: 40 mg/dL — CL (ref 70–99)
Glucose-Capillary: 517 mg/dL (ref 70–99)
Glucose-Capillary: 61 mg/dL — ABNORMAL LOW (ref 70–99)
Glucose-Capillary: 96 mg/dL (ref 70–99)
Glucose-Capillary: >600 mg/dL (ref 70–99)
Glucose-Capillary: >600 mg/dL (ref 70–99)
Glucose-Capillary: >600 mg/dL (ref 70–99)
Glucose-Capillary: >600 mg/dL (ref 70–99)
Glucose-Capillary: >600 mg/dL (ref 70–99)
Glucose-Capillary: >600 mg/dL (ref 70–99)

## 2018-11-11 LAB — BASIC METABOLIC PANEL
Anion gap: 15 (ref 5–15)
Anion gap: 12 (ref 5–15)
Anion gap: 11 (ref 5–15)
BUN: 50 mg/dL — ABNORMAL HIGH (ref 8–23)
BUN: 47 mg/dL — ABNORMAL HIGH (ref 8–23)
BUN: 46 mg/dL — ABNORMAL HIGH (ref 8–23)
CO2: 17 mmol/L — ABNORMAL LOW (ref 22–32)
CO2: 22 mmol/L (ref 22–32)
CO2: 22 mmol/L (ref 22–32)
Calcium: 9.3 mg/dL (ref 8.9–10.3)
Calcium: 9.6 mg/dL (ref 8.9–10.3)
Calcium: 9.4 mg/dL (ref 8.9–10.3)
Chloride: 99 mmol/L (ref 98–111)
Chloride: 109 mmol/L (ref 98–111)
Chloride: 113 mmol/L — ABNORMAL HIGH (ref 98–111)
Creatinine, Ser: 1.56 mg/dL — ABNORMAL HIGH (ref 0.61–1.24)
Creatinine, Ser: 1.58 mg/dL — ABNORMAL HIGH (ref 0.61–1.24)
Creatinine, Ser: 1.49 mg/dL — ABNORMAL HIGH (ref 0.61–1.24)
GFR calc Af Amer: 54 mL/min — ABNORMAL LOW (ref >60)
GFR calc Af Amer: 54 mL/min — ABNORMAL LOW (ref >60)
GFR calc Af Amer: 57 mL/min — ABNORMAL LOW (ref >60)
GFR calc non Af Amer: 47 mL/min — ABNORMAL LOW (ref >60)
GFR calc non Af Amer: 46 mL/min — ABNORMAL LOW (ref >60)
GFR calc non Af Amer: 50 mL/min — ABNORMAL LOW (ref >60)
Glucose, Bld: 861 mg/dL (ref 70–99)
Glucose, Bld: 317 mg/dL — ABNORMAL HIGH (ref 70–99)
Glucose, Bld: 109 mg/dL — ABNORMAL HIGH (ref 70–99)
Potassium: 4.8 mmol/L (ref 3.5–5.1)
Potassium: 3.8 mmol/L (ref 3.5–5.1)
Potassium: 3.8 mmol/L (ref 3.5–5.1)
Sodium: 131 mmol/L — ABNORMAL LOW (ref 135–145)
Sodium: 143 mmol/L (ref 135–145)
Sodium: 146 mmol/L — ABNORMAL HIGH (ref 135–145)

## 2018-11-11 LAB — COMPREHENSIVE METABOLIC PANEL
ALT: 35 U/L (ref 0–44)
ALT: 21 U/L (ref 0–44)
AST: 37 U/L (ref 15–41)
AST: 22 U/L (ref 15–41)
Albumin: 3.4 g/dL — ABNORMAL LOW (ref 3.5–5.0)
Albumin: 2.3 g/dL — ABNORMAL LOW (ref 3.5–5.0)
Alkaline Phosphatase: 186 U/L — ABNORMAL HIGH (ref 38–126)
Alkaline Phosphatase: 117 U/L (ref 38–126)
Anion gap: 25 — ABNORMAL HIGH (ref 5–15)
Anion gap: 8 (ref 5–15)
BUN: 53 mg/dL — ABNORMAL HIGH (ref 8–23)
BUN: 39 mg/dL — ABNORMAL HIGH (ref 8–23)
CO2: 13 mmol/L — ABNORMAL LOW (ref 22–32)
CO2: 24 mmol/L (ref 22–32)
Calcium: 8.9 mg/dL (ref 8.9–10.3)
Calcium: 9.1 mg/dL (ref 8.9–10.3)
Chloride: 82 mmol/L — ABNORMAL LOW (ref 98–111)
Chloride: 114 mmol/L — ABNORMAL HIGH (ref 98–111)
Creatinine, Ser: 1.84 mg/dL — ABNORMAL HIGH (ref 0.61–1.24)
Creatinine, Ser: 1.35 mg/dL — ABNORMAL HIGH (ref 0.61–1.24)
GFR calc Af Amer: 45 mL/min — ABNORMAL LOW (ref >60)
GFR calc Af Amer: >60 mL/min (ref >60)
GFR calc non Af Amer: 38 mL/min — ABNORMAL LOW (ref >60)
GFR calc non Af Amer: 56 mL/min — ABNORMAL LOW (ref >60)
Glucose, Bld: 1401 mg/dL (ref 70–99)
Glucose, Bld: 75 mg/dL (ref 70–99)
Potassium: 6.8 mmol/L (ref 3.5–5.1)
Potassium: 4.4 mmol/L (ref 3.5–5.1)
Sodium: 120 mmol/L — ABNORMAL LOW (ref 135–145)
Sodium: 146 mmol/L — ABNORMAL HIGH (ref 135–145)
Total Bilirubin: 1.8 mg/dL — ABNORMAL HIGH (ref 0.3–1.2)
Total Bilirubin: 0.7 mg/dL (ref 0.3–1.2)
Total Protein: 7.8 g/dL (ref 6.5–8.1)
Total Protein: 5.9 g/dL — ABNORMAL LOW (ref 6.5–8.1)

## 2018-11-11 LAB — LACTIC ACID, PLASMA
Lactic Acid, Venous: 5.8 mmol/L (ref 0.5–1.9)
Lactic Acid, Venous: 3.6 mmol/L (ref 0.5–1.9)
Lactic Acid, Venous: 2.5 mmol/L (ref 0.5–1.9)
Lactic Acid, Venous: 1.2 mmol/L (ref 0.5–1.9)

## 2018-11-11 LAB — RAPID URINE DRUG SCREEN, HOSP PERFORMED
Amphetamines: NOT DETECTED
Barbiturates: NOT DETECTED
Benzodiazepines: NOT DETECTED
Cocaine: NOT DETECTED
Opiates: NOT DETECTED
Tetrahydrocannabinol: NOT DETECTED

## 2018-11-11 LAB — GLUCOSE, CAPILLARY
Glucose-Capillary: 41 mg/dL — CL (ref 70–99)
Glucose-Capillary: 71 mg/dL (ref 70–99)
Glucose-Capillary: 74 mg/dL (ref 70–99)

## 2018-11-11 LAB — CBC WITH DIFFERENTIAL/PLATELET
Abs Immature Granulocytes: 0.09 10*3/uL — ABNORMAL HIGH (ref 0.00–0.07)
Abs Immature Granulocytes: 0.15 10*3/uL — ABNORMAL HIGH (ref 0.00–0.07)
Basophils Absolute: 0.1 10*3/uL (ref 0.0–0.1)
Basophils Absolute: 0.2 10*3/uL — ABNORMAL HIGH (ref 0.0–0.1)
Basophils Relative: 1 %
Basophils Relative: 1 %
Eosinophils Absolute: 0 10*3/uL (ref 0.0–0.5)
Eosinophils Absolute: 0.1 10*3/uL (ref 0.0–0.5)
Eosinophils Relative: 0 %
Eosinophils Relative: 0 %
HCT: 40 % (ref 39.0–52.0)
HCT: 28.5 % — ABNORMAL LOW (ref 39.0–52.0)
Hemoglobin: 11.9 g/dL — ABNORMAL LOW (ref 13.0–17.0)
Hemoglobin: 9.3 g/dL — ABNORMAL LOW (ref 13.0–17.0)
Immature Granulocytes: 1 %
Immature Granulocytes: 1 %
Lymphocytes Relative: 10 %
Lymphocytes Relative: 19 %
Lymphs Abs: 1.3 10*3/uL (ref 0.7–4.0)
Lymphs Abs: 3.9 10*3/uL (ref 0.7–4.0)
MCH: 27.7 pg (ref 26.0–34.0)
MCH: 27.2 pg (ref 26.0–34.0)
MCHC: 29.8 g/dL — ABNORMAL LOW (ref 30.0–36.0)
MCHC: 32.6 g/dL (ref 30.0–36.0)
MCV: 93.2 fL (ref 80.0–100.0)
MCV: 83.3 fL (ref 80.0–100.0)
Monocytes Absolute: 0.6 10*3/uL (ref 0.1–1.0)
Monocytes Absolute: 2 10*3/uL — ABNORMAL HIGH (ref 0.1–1.0)
Monocytes Relative: 4 %
Monocytes Relative: 10 %
Neutro Abs: 11.1 10*3/uL — ABNORMAL HIGH (ref 1.7–7.7)
Neutro Abs: 14.8 10*3/uL — ABNORMAL HIGH (ref 1.7–7.7)
Neutrophils Relative %: 84 %
Neutrophils Relative %: 69 %
Platelets: 495 10*3/uL — ABNORMAL HIGH (ref 150–400)
Platelets: 333 10*3/uL (ref 150–400)
RBC: 4.29 MIL/uL (ref 4.22–5.81)
RBC: 3.42 MIL/uL — ABNORMAL LOW (ref 4.22–5.81)
RDW: 14.9 % (ref 11.5–15.5)
RDW: 14.8 % (ref 11.5–15.5)
WBC: 13.1 10*3/uL — ABNORMAL HIGH (ref 4.0–10.5)
WBC: 21.1 10*3/uL — ABNORMAL HIGH (ref 4.0–10.5)
nRBC: 0 % (ref 0.0–0.2)
nRBC: 0 % (ref 0.0–0.2)

## 2018-11-11 LAB — URINALYSIS, ROUTINE W REFLEX MICROSCOPIC
Bacteria, UA: NONE SEEN
Bilirubin Urine: NEGATIVE
Glucose, UA: >=500 mg/dL — AB
Hgb urine dipstick: NEGATIVE
Ketones, ur: 20 mg/dL — AB
Leukocytes,Ua: NEGATIVE
Nitrite: NEGATIVE
Protein, ur: 30 mg/dL — AB
Specific Gravity, Urine: 1.021 (ref 1.005–1.030)
pH: 6 (ref 5.0–8.0)

## 2018-11-11 LAB — POCT I-STAT EG7
Acid-base deficit: 10 mmol/L — ABNORMAL HIGH (ref 0.0–2.0)
Acid-base deficit: 7 mmol/L — ABNORMAL HIGH (ref 0.0–2.0)
Bicarbonate: 15.6 mmol/L — ABNORMAL LOW (ref 20.0–28.0)
Bicarbonate: 16.2 mmol/L — ABNORMAL LOW (ref 20.0–28.0)
Calcium, Ion: 1.16 mmol/L (ref 1.15–1.40)
Calcium, Ion: 1.2 mmol/L (ref 1.15–1.40)
HCT: 40 % (ref 39.0–52.0)
HCT: 32 % — ABNORMAL LOW (ref 39.0–52.0)
Hemoglobin: 13.6 g/dL (ref 13.0–17.0)
Hemoglobin: 10.9 g/dL — ABNORMAL LOW (ref 13.0–17.0)
O2 Saturation: 67 %
O2 Saturation: 97 %
Patient temperature: 37
Potassium: 6.2 mmol/L — ABNORMAL HIGH (ref 3.5–5.1)
Potassium: 5.2 mmol/L — ABNORMAL HIGH (ref 3.5–5.1)
Sodium: 119 mmol/L — CL (ref 135–145)
Sodium: 129 mmol/L — ABNORMAL LOW (ref 135–145)
TCO2: 17 mmol/L — ABNORMAL LOW (ref 22–32)
TCO2: 17 mmol/L — ABNORMAL LOW (ref 22–32)
pCO2, Ven: 33.3 mmHg — ABNORMAL LOW (ref 44.0–60.0)
pCO2, Ven: 24.9 mmHg — ABNORMAL LOW (ref 44.0–60.0)
pH, Ven: 7.279 (ref 7.250–7.430)
pH, Ven: 7.423 (ref 7.250–7.430)
pO2, Ven: 38 mmHg (ref 32.0–45.0)
pO2, Ven: 92 mmHg — ABNORMAL HIGH (ref 32.0–45.0)

## 2018-11-11 LAB — CRYPTOCOCCAL ANTIGEN, CSF: Crypto Ag: NEGATIVE

## 2018-11-11 LAB — CSF CELL COUNT WITH DIFFERENTIAL
Lymphs, CSF: 6 % — ABNORMAL LOW (ref 40–80)
Monocyte-Macrophage-Spinal Fluid: 15 % (ref 15–45)
RBC Count, CSF: 16 /mm3 — ABNORMAL HIGH
RBC Count, CSF: 520 /mm3 — ABNORMAL HIGH
Segmented Neutrophils-CSF: 79 % — ABNORMAL HIGH (ref 0–6)
Tube #: 1
Tube #: 4
WBC, CSF: 12 /mm3 (ref 0–5)
WBC, CSF: 8 /mm3 — ABNORMAL HIGH (ref 0–5)

## 2018-11-11 LAB — SALICYLATE LEVEL: Salicylate Lvl: <7 mg/dL (ref 2.8–30.0)

## 2018-11-11 LAB — GLUCOSE, CSF: Glucose, CSF: 530 mg/dL — ABNORMAL HIGH (ref 40–70)

## 2018-11-11 LAB — PHOSPHORUS: Phosphorus: 3.6 mg/dL (ref 2.5–4.6)

## 2018-11-11 LAB — MAGNESIUM: Magnesium: 2.3 mg/dL (ref 1.7–2.4)

## 2018-11-11 LAB — PROTIME-INR
INR: 0.9 (ref 0.8–1.2)
Prothrombin Time: 12.4 seconds (ref 11.4–15.2)

## 2018-11-11 LAB — ACETAMINOPHEN LEVEL: Acetaminophen (Tylenol), Serum: <10 ug/mL — ABNORMAL LOW (ref 10–30)

## 2018-11-11 LAB — AMMONIA: Ammonia: 23 umol/L (ref 9–35)

## 2018-11-11 LAB — PROTEIN, CSF: Total  Protein, CSF: 110 mg/dL — ABNORMAL HIGH (ref 15–45)

## 2018-11-11 LAB — ETHANOL: Alcohol, Ethyl (B): <10 mg/dL (ref <10)

## 2018-11-11 LAB — PATHOLOGIST SMEAR REVIEW

## 2018-11-11 LAB — SARS CORONAVIRUS 2 BY RT PCR (HOSPITAL ORDER, PERFORMED IN ~~LOC~~ HOSPITAL LAB): SARS Coronavirus 2: NEGATIVE

## 2018-11-11 MED ORDER — DEXTROSE 50 % IV SOLN
1.0000 | Freq: Once | INTRAVENOUS | Status: AC
  Administered 2018-11-11: 16:00:00 50 mL via INTRAVENOUS

## 2018-11-11 MED ORDER — SODIUM CHLORIDE 0.9 % IV SOLN
2.0000 g | Freq: Two times a day (BID) | INTRAVENOUS | Status: DC
  Administered 2018-11-12 (×2): 2 g via INTRAVENOUS
  Filled 2018-11-11 (×4): qty 20

## 2018-11-11 MED ORDER — VANCOMYCIN HCL IN DEXTROSE 1-5 GM/200ML-% IV SOLN: 1000.0000 mg | Freq: Once | INTRAVENOUS | Status: DC

## 2018-11-11 MED ORDER — INSULIN ASPART 100 UNIT/ML ~~LOC~~ SOLN
10.0000 [IU] | Freq: Once | SUBCUTANEOUS | Status: AC
  Administered 2018-11-11: 10 [IU] via INTRAVENOUS

## 2018-11-11 MED ORDER — LORAZEPAM 2 MG/ML IJ SOLN
1.0000 mg | Freq: Once | INTRAMUSCULAR | Status: AC
  Administered 2018-11-11: 06:00:00 1 mg via INTRAVENOUS
  Filled 2018-11-11: qty 1

## 2018-11-11 MED ORDER — INSULIN ASPART 100 UNIT/ML ~~LOC~~ SOLN
0.0000 [IU] | SUBCUTANEOUS | Status: DC
  Administered 2018-11-12: 3 [IU] via SUBCUTANEOUS
  Administered 2018-11-12: 12:00:00 1 [IU] via SUBCUTANEOUS
  Administered 2018-11-12: 17:00:00 3 [IU] via SUBCUTANEOUS
  Administered 2018-11-13 (×2): 2 [IU] via SUBCUTANEOUS
  Administered 2018-11-13: 1 [IU] via SUBCUTANEOUS
  Administered 2018-11-13: 9 [IU] via SUBCUTANEOUS
  Administered 2018-11-13 – 2018-11-14 (×2): 7 [IU] via SUBCUTANEOUS
  Administered 2018-11-14: 5 [IU] via SUBCUTANEOUS
  Administered 2018-11-14: 09:00:00 3 [IU] via SUBCUTANEOUS
  Administered 2018-11-14: 1 [IU] via SUBCUTANEOUS
  Administered 2018-11-14: 12:00:00 5 [IU] via SUBCUTANEOUS
  Administered 2018-11-15 (×2): 2 [IU] via SUBCUTANEOUS
  Administered 2018-11-15 (×2): 7 [IU] via SUBCUTANEOUS
  Administered 2018-11-16: 5 [IU] via SUBCUTANEOUS
  Administered 2018-11-16: 13:00:00 3 [IU] via SUBCUTANEOUS
  Administered 2018-11-16: 5 [IU] via SUBCUTANEOUS
  Administered 2018-11-16: 2 [IU] via SUBCUTANEOUS
  Administered 2018-11-17: 10:00:00 3 [IU] via SUBCUTANEOUS
  Administered 2018-11-17: 02:00:00 2 [IU] via SUBCUTANEOUS
  Administered 2018-11-17: 13:00:00 3 [IU] via SUBCUTANEOUS

## 2018-11-11 MED ORDER — SODIUM CHLORIDE 0.9 % IV BOLUS (SEPSIS)
1000.0000 mL | Freq: Once | INTRAVENOUS | Status: AC
  Administered 2018-11-11: 02:00:00 1000 mL via INTRAVENOUS

## 2018-11-11 MED ORDER — INSULIN DETEMIR 100 UNIT/ML ~~LOC~~ SOLN
5.0000 [IU] | Freq: Every day | SUBCUTANEOUS | Status: DC
  Administered 2018-11-12 – 2018-11-14 (×3): 5 [IU] via SUBCUTANEOUS
  Filled 2018-11-11 (×5): qty 0.05

## 2018-11-11 MED ORDER — INSULIN DETEMIR 100 UNIT/ML ~~LOC~~ SOLN: 10.0000 [IU] | SUBCUTANEOUS | Status: DC

## 2018-11-11 MED ORDER — HALOPERIDOL LACTATE 5 MG/ML IJ SOLN
5.0000 mg | Freq: Once | INTRAMUSCULAR | Status: AC
  Administered 2018-11-11: 5 mg via INTRAMUSCULAR
  Filled 2018-11-11: qty 1

## 2018-11-11 MED ORDER — INSULIN REGULAR(HUMAN) IN NACL 100-0.9 UT/100ML-% IV SOLN
INTRAVENOUS | Status: DC
  Administered 2018-11-11: 5.4 [IU]/h via INTRAVENOUS
  Filled 2018-11-11: qty 100

## 2018-11-11 MED ORDER — DEXTROSE 50 % IV SOLN
INTRAVENOUS | Status: AC
  Administered 2018-11-11: 21:00:00 50 mL
  Filled 2018-11-11: qty 50

## 2018-11-11 MED ORDER — SODIUM CHLORIDE 0.9 % IV SOLN
2.0000 g | INTRAVENOUS | Status: DC
  Administered 2018-11-11 – 2018-11-13 (×11): 2 g via INTRAVENOUS
  Filled 2018-11-11 (×6): qty 2000
  Filled 2018-11-11: qty 2
  Filled 2018-11-11: qty 2000
  Filled 2018-11-11: qty 2
  Filled 2018-11-11 (×7): qty 2000

## 2018-11-11 MED ORDER — INSULIN DETEMIR 100 UNIT/ML ~~LOC~~ SOLN: 10.0000 [IU] | Freq: Every day | SUBCUTANEOUS | Status: DC

## 2018-11-11 MED ORDER — CALCIUM GLUCONATE-NACL 1-0.675 GM/50ML-% IV SOLN
1.0000 g | Freq: Once | INTRAVENOUS | Status: AC
  Administered 2018-11-11: 09:00:00 1000 mg via INTRAVENOUS
  Filled 2018-11-11: qty 50

## 2018-11-11 MED ORDER — DEXTROSE 50 % IV SOLN
INTRAVENOUS | Status: AC
  Administered 2018-11-12: 01:00:00 25 mL
  Filled 2018-11-11: qty 50

## 2018-11-11 MED ORDER — DEXTROSE 5 % IV SOLN
10.0000 mg/kg | Freq: Three times a day (TID) | INTRAVENOUS | Status: DC
  Administered 2018-11-12 – 2018-11-13 (×5): 700 mg via INTRAVENOUS
  Filled 2018-11-11 (×7): qty 14

## 2018-11-11 MED ORDER — SODIUM BICARBONATE 8.4 % IV SOLN
50.0000 meq | Freq: Once | INTRAVENOUS | Status: AC
  Administered 2018-11-11: 09:00:00 50 meq via INTRAVENOUS
  Filled 2018-11-11: qty 50

## 2018-11-11 MED ORDER — HALOPERIDOL LACTATE 5 MG/ML IJ SOLN: 2.0000 mg | Freq: Once | INTRAMUSCULAR | Status: DC

## 2018-11-11 MED ORDER — VANCOMYCIN HCL IN DEXTROSE 750-5 MG/150ML-% IV SOLN
750.0000 mg | Freq: Two times a day (BID) | INTRAVENOUS | Status: DC
  Administered 2018-11-12 – 2018-11-13 (×2): 750 mg via INTRAVENOUS
  Filled 2018-11-11 (×4): qty 150

## 2018-11-11 MED ORDER — DEXTROSE-NACL 5-0.45 % IV SOLN
INTRAVENOUS | Status: DC
  Administered 2018-11-11 (×2): via INTRAVENOUS

## 2018-11-11 MED ORDER — INSULIN DETEMIR 100 UNIT/ML ~~LOC~~ SOLN
10.0000 [IU] | Freq: Every day | SUBCUTANEOUS | Status: DC
  Filled 2018-11-11 (×2): qty 0.1

## 2018-11-11 MED ORDER — SODIUM CHLORIDE 0.9 % IV SOLN: INTRAVENOUS | Status: AC

## 2018-11-11 MED ORDER — INSULIN REGULAR(HUMAN) IN NACL 100-0.9 UT/100ML-% IV SOLN
INTRAVENOUS | Status: DC
  Administered 2018-11-11: 09:00:00 9.1 [IU]/h via INTRAVENOUS
  Filled 2018-11-11: qty 100

## 2018-11-11 MED ORDER — INSULIN DETEMIR 100 UNIT/ML ~~LOC~~ SOLN
14.0000 [IU] | SUBCUTANEOUS | Status: DC
  Filled 2018-11-11: qty 0.14

## 2018-11-11 MED ORDER — LORAZEPAM 2 MG/ML IJ SOLN
INTRAMUSCULAR | Status: AC
  Administered 2018-11-11: 02:00:00 2 mg
  Filled 2018-11-11: qty 1

## 2018-11-11 MED ORDER — SODIUM CHLORIDE 0.9 % IV SOLN
INTRAVENOUS | Status: DC
  Administered 2018-11-11: 09:00:00 via INTRAVENOUS

## 2018-11-11 MED ORDER — HYDRALAZINE HCL 20 MG/ML IJ SOLN: 5.0000 mg | INTRAMUSCULAR | Status: DC | PRN

## 2018-11-11 MED ORDER — VANCOMYCIN HCL 10 G IV SOLR
1500.0000 mg | Freq: Once | INTRAVENOUS | Status: AC
  Administered 2018-11-11: 07:00:00 1500 mg via INTRAVENOUS
  Filled 2018-11-11: qty 1500

## 2018-11-11 MED ORDER — SODIUM CHLORIDE 0.9 % IV BOLUS (SEPSIS)
2000.0000 mL | Freq: Once | INTRAVENOUS | Status: AC
  Administered 2018-11-11: 03:00:00 2000 mL via INTRAVENOUS

## 2018-11-11 MED ORDER — SODIUM CHLORIDE 0.9 % IV SOLN
2.0000 g | Freq: Once | INTRAVENOUS | Status: AC
  Administered 2018-11-11: 06:00:00 2 g via INTRAVENOUS
  Filled 2018-11-11: qty 20

## 2018-11-11 NOTE — ED Notes (Signed)
Pt CBG too high to read. Notified Jenny Reichmann, Therapist, sports.

## 2018-11-11 NOTE — ED Notes (Signed)
Attempted to reach son without success.   Will try again later.

## 2018-11-11 NOTE — ED Notes (Addendum)
This RN notified MD pt CBG is 106 and of current BMET.   He is not currently ready to move to phase 2 of DKA Protocol.

## 2018-11-11 NOTE — Progress Notes (Signed)
Pharmacy Antibiotic Note  [name] is a 63 y.o. male admitted on 11/11/2018 with AMS and hyperglycemia.  Pharmacy has been consulted for acyclovir dosing for possible meningitis; also on antibiotics. AKI noted, SCr 1.56. LP completed in ED.  Plan: Acyclovir 700 mg IV q8h (~10 mg/kg) Monitor renal function, C/S and clinical progress  Weight: 154 lb 5.2 oz (70 kg)  Temp (24hrs), Avg:99.6 F (37.6 C), Min:99 F (37.2 C), Max:100.6 F (38.1 C)  Recent Labs  Lab 11/11/18 0103 11/11/18 0626 11/11/18 0628 11/11/18 1100 11/11/18 1252 11/11/18 1353 11/11/18 1540  WBC 13.1*  --   --   --   --   --   --   CREATININE 1.84*  --  1.56* 1.58*  --  1.49*  --   LATICACIDVEN 5.8* 3.6*  --   --  2.5*  --  1.2    Estimated Creatinine Clearance: 50.9 mL/min (A) (by C-G formula based on SCr of 1.49 mg/dL (H)).    Allergies  Allergen Reactions  . Codeine Itching    Tolerates hydrocodone/apap    Antimicrobials this admission: acyclovir 7/29 >>  ampicillin 7/29 >>  ceftriaxone 7/29 >> vancomycin 7/29 >>  Dose adjustments this admission:   Microbiology results: 7/29 CSF: 7/29 BCx: 7/29 UCx: reinc 7/29 COVID: neg   Thank you for involving pharmacy in this patient's care.  Renold Genta, PharmD, BCPS Clinical Pharmacist Clinical phone for 11/11/2018 until 10:30p is [phone] 11/11/2018 10:02 PM  **Pharmacist phone directory can be found on [email or web address] listed under [place]**

## 2018-11-11 NOTE — ED Notes (Signed)
Pt CBG 103. Notified stephen, Therapist, sports.

## 2018-11-11 NOTE — ED Notes (Signed)
Pt continues to be agitated, unable to follow commands, side rails padded for safety, continues with mittens and restraints.

## 2018-11-11 NOTE — Progress Notes (Signed)
Patient was admitted early this morning after midnight and H&P has been reviewed and I am in current agreement with assessment and plan done by Dr. Shela Leff.  Additional changes the plan of care been made accordingly.  63 year old Caucasian male with a past medical history significant for but not limited to depression, insulin-dependent diabetes mellitus, drug abuse, hepatitis C which is chronic, hypertension, as well as other comorbidities who presented to the emergency room for evaluation for AMS as well as hyperglycemia.  He initially received Ativan prior to obtaining LP and remains somnolent and a history is not obtainable as he remains somnolent and encephalopathic.  He was admitted for the following and is currently being treated for:  DKA  -Likely precipitating factor is infection given fever and signs of sepsis.  -Blood glucose 1401.  -On admission Bicarb 13, anion gap 25.   -UA positive for ketones. Initial VBG with pH 7.27, repeat VBG with pH 7.42. -Insulin per DKA protocol and will continue for now -Normal saline at 250 cc/h, switch to D5-1 half normal saline at 100 cc/h when CBG less than 250 -CBG's are improving and Last CBG was 383 -Continue with BMP every 4 hours -CBG every 1 hour -Initiate diet and subcutaneous insulin when bicarb normal and gap closes and when he is more awake  Sepsis, unclear exact etiology, concern for possible meningitis vs. GI infection  -Temperature 100.6 F.   -Persistently tachycardic and tachypneic.  Not hypotensive.   -White count 13.1 with left shift.   -Lactic acid 5.8 and repeat trending down to 3.6.   -COVID-19 rapid test negative.   -UA not suggestive of infection.   -Chest x-ray without evidence of pneumonia.   -Concern for possible meningitis given fever and altered mental status. -Check CT Abd/Pelvis as patient had a distended and firm Abdomen -Broad-spectrum antibiotic coverage with vancomycin, cefepime, and ampicillin. -IV fluid  resuscitation -LP performed in the ED, CSF analysis labs sent; May discuss with Neurology and ID  -Blood culture x2 pending -Urine culture pending -Continue to trend lactate -Continue to Monitor Closely   Acute Encephalopathy -Likely secondary to infection/possible meningitis and DKA.  -Ethanol, acetaminophen, and salicylate levels normal.   -Ammonia normal.   -UDS negative.   -Head CT negative for acute finding.  Significant Hyperkalemia -Potassium 6.8.  Likely related to metabolic acidosis from DKA and AKI. -Cardiac monitoring -EKG pending -Calcium gluconate -Currently receiving IV insulin for DKA which should improve hyperkalemia -Sodium bicarbonate -Monitor potassium level very closely  AKI -BUN 53, creatinine 1.8 on Admission.  Baseline creatinine 0.9-1.1.  -Likely prerenal secondary to dehydration in the setting of DKA and sepsis. -C/w IV fluid hydration -BUN/Cr is now 50/1.56 and will repeat  -Continue to monitor BMP's -Avoid Nephrotoxic agents and Medications, Contrast Dyes, and Hypotension -Continue to Monitor urine output Closely   Hypertensive Urgency -Systolic currently in 502D. -IV hydralazine PRN   Elevated LFTs -Alkaline phosphatase 186.  T bili 1.8.  AST and ALT normal.  Abdominal exam benign. -Right upper quadrant ultrasound  Hyponatremia and Pseudohyponatremia likely secondary to hyperglycemia -Patient sodium was 119 on admission is now 131 -Continue with IV fluid hydration as above -Continue monitor and trend CMP and BMPs.  We will continue monitor patient's clinical response to intervention and repeat blood work in the a.m.  Will obtain a CT of the abdomen pelvis to further evaluate patient's distention and pain on palpation.

## 2018-11-11 NOTE — ED Provider Notes (Signed)
[facility] EMERGENCY DEPARTMENT Provider Note   CSN: [id number] Arrival date & time: 11/11/18  0025     History   Chief Complaint Chief Complaint  Patient presents with   Hyperglycemia   Level 5 caveat due to altered mental status HPI [name] is a 63 y.o. male.     The history is provided by the EMS personnel. The history is limited by the condition of the patient.  Hyperglycemia Severity:  Severe Onset quality:  Sudden Timing:  Constant Progression:  Worsening Patient presents via EMS for altered mental status hyperglycemia. Patient was last seen using insulin earlier today.  He is now having confusion and elevated glucose.  No other details known on arrival  Past Medical History:  Diagnosis Date   DEPRESSION    DIABETES MELLITUS, TYPE I    DRUG ABUSE    pt should have NO controlled substances rx'ed   Glaucoma    HEPATITIS C    chronic   Heroin overdose ([place]) 10/03/2018   Heroin use 10/03/2018   Hypertension 02/18/2011   Proliferative diabetic retinopathy(362.02)    Vitiligo     Patient Active Problem List   Diagnosis Date Noted   Esophageal dysphagia 10/13/2018   Acute esophagitis 10/13/2018   Loose stools 10/13/2018   Routine general medical examination at a health care facility 10/13/2018   Left lower lobe pneumonia ([place]) 10/07/2018   Acute respiratory failure ([name]    Altered mental status    Gastroesophageal reflux disease    Hallucinations    Hypoglycemia associated with diabetes ([place]) 08/12/2018   At risk for adverse drug event 02/17/2018   Abrasions of multiple sites    Demand ischemia of myocardium ([place]) 71/09/2692   Acute metabolic encephalopathy 85/46/2703   DKA (diabetic ketoacidoses) ([place]) 02/03/2018   Leukocytosis 02/03/2018   Hypoglycemia 12/24/2017   Syncope 12/24/2017   Syncope and collapse 12/23/2017   IV drug abuse ([place]) 04/19/2017   Acute encephalopathy 04/19/2017    Hemangioma 10/02/2016   Foreign body (FB) in soft tissue    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[place])    Gangrene ([place])    Type I (juvenile type) diabetes mellitus without mention of complication, not stated as uncontrolled    Tobacco abuse    Essential hypertension    Diabetic infection of right foot ([place]) 07/22/2016   Diabetic foot infection ([place]) 07/22/2016   Chronic ulcer of heel, right, with fat layer exposed ([place]) 05/20/2016   DKA, type 1 ([place]) 04/18/2016   AKI (acute kidney injury) ([place]) 04/18/2016   Hyponatremia 04/18/2016   Severe protein-calorie malnutrition ([place]) 04/18/2016   Elevated troponin 04/18/2016   Poor dentition 07/12/2015   Tobacco use 07/06/2013   PAD (peripheral artery disease) ([place]) 04/29/2013   COPD, mild ([place])    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 ([place]) 11/18/2011   Diabetic neuropathy ([place]) 06/21/2010   PROLIFERATIVE DIABETIC RETINOPATHY 06/21/2010   SKIN TAG 01/30/2010   Chronic hepatitis C ([place]) 11/22/2008   VITILIGO 11/22/2008   PROTEINURIA, MILD 11/22/2008   Substance abuse ([place]) 04/23/2007   DEPRESSION 11/11/2006    Past Surgical History:  Procedure Laterality Date   ABDOMINAL AORTOGRAM W/LOWER EXTREMITY N/A 07/25/2016   Procedure: Abdominal Aortogram w/Bilateral Lower Extremity Runoff;  Surgeon: Conrad Homer, MD;  Location: [facility];  Service: Cardiovascular;  Laterality: N/A;   ABDOMINAL AORTOGRAM W/LOWER EXTREMITY N/A 12/24/2016   Procedure: ABDOMINAL AORTOGRAM W/LOWER EXTREMITY;  Surgeon: Serafina Mitchell,  MD;  Location: [facility];  Service: Cardiovascular;  Laterality: N/A;   AMPUTATION TOE Right 07/26/2016   Procedure: AMPUTATION GREAT TOE;  Surgeon: Serafina Mitchell, MD;  Location: [facility];  Service: Vascular;  Laterality: Right;   BIOPSY  08/25/2018   Procedure: BIOPSY;  Surgeon: Irving Copas., MD;  Location: [facility];  Service: Gastroenterology;;    ESOPHAGOGASTRODUODENOSCOPY N/A 08/25/2018   Procedure: ESOPHAGOGASTRODUODENOSCOPY (EGD);  Surgeon: Irving Copas., MD;  Location: [facility];  Service: Gastroenterology;  Laterality: N/A;   EYE SURGERY     retinal surgery x 2, right eye   INSERTION OF ILIAC STENT Right 07/26/2016   Procedure: INSERTION OF RIGHT POPLITEAL STENT WITH BALLOON ANGIOPLASTY;  Surgeon: Serafina Mitchell, MD;  Location: [facility];  Service: Vascular;  Laterality: Right;   LOWER EXTREMITY ANGIOGRAM Right 07/26/2016   Procedure: LOWER EXTREMITY ANGIOGRAM;  Surgeon: Serafina Mitchell, MD;  Location: [facility];  Service: Vascular;  Laterality: Right;   PERIPHERAL VASCULAR BALLOON ANGIOPLASTY Right 07/26/2016   Procedure: PERIPHERAL VASCULAR BALLOON ANGIOPLASTY  RIGHT ANTERIOR TIBEAL ARTERY AND RIGHT SUPERFICIAL FEMORAL ARTERY;  Surgeon: Serafina Mitchell, MD;  Location: [facility];  Service: Vascular;  Laterality: Right;   PERIPHERAL VASCULAR INTERVENTION Right 12/24/2016   Procedure: PERIPHERAL VASCULAR INTERVENTION;  Surgeon: Serafina Mitchell, MD;  Location: [facility];  Service: Cardiovascular;  Laterality: Right;  lower extr        Home Medications    Prior to Admission medications   Medication Sig Start Date End Date Taking? Authorizing Provider  Accu-Chek Softclix Lancets lancets Use to monitor glucose levels BID; E10.29 08/19/18   Renato Shin, MD  amLODipine (NORVASC) 10 MG tablet Take 1 tablet (10 mg total) by mouth daily for 30 days. 08/15/18 10/03/18  Cristal Ford, DO  bacitracin ointment Apply topically 2 (two) times daily. Apply 1/8th of an inch topically to scalp wound twice a day after washing wound with soap & water.  Leave open to air. 10/11/18   Annita Brod, MD  Blood Glucose Calibration (ACCU-CHEK AVIVA) SOLN 1 each by In Vitro route as needed. Use to calibrate meter prn 08/19/18   Renato Shin, MD  blood glucose meter kit and supplies KIT Dispense based on patient and insurance preference. Use  up to four times daily as directed. (FOR ICD-9 250.00, 250.01). 08/15/18   Cristal Ford, DO  Blood Glucose Monitoring Suppl (ACCU-CHEK AVIVA PLUS) w/Device KIT 1 each by Does not apply route 2 (two) times a day. Use to monitor glucose levels BID; E10.29 08/19/18   Renato Shin, MD  folic acid (FOLVITE) 1 MG tablet Take 1 tablet (1 mg total) by mouth daily. 09/11/18   Barton Dubois, MD  glucose blood (ACCU-CHEK AVIVA PLUS) test strip Use to monitor glucose levels BID; E10.29 08/19/18   Renato Shin, MD  insulin aspart (NOVOLOG) 100 UNIT/ML injection < 120: No insulin 120-150: 1 unit, 150-200: 2 units, 200-250: 3 units 250-300: 4 units, 300-350: 5 units, 350-400: 7 units 10/11/18   Annita Brod, MD  insulin aspart protamine - aspart (NOVOLOG 70/30 MIX) (70-30) 100 UNIT/ML FlexPen Inject 18 units every morning and 18 units every evening. 10/11/18   Annita Brod, MD  Insulin Pen Needle 31G X 5 MM MISC Use with novolog flex pen 08/15/18   Cristal Ford, DO  Isopropyl Alcohol Wipes 70 % MISC Apply 1 each topically daily as needed. 02/27/18   Medina-Vargas, Monina C, NP  pantoprazole (PROTONIX)  40 MG tablet Take 1 tablet (40 mg total) by mouth 2 (two) times daily. 10/13/18   Mansouraty, Telford Nab., MD  pregabalin (LYRICA) 50 MG capsule Take 1 capsule (50 mg total) by mouth 2 (two) times daily. 09/10/18   Barton Dubois, MD  risperiDONE (RISPERDAL) 0.5 MG tablet Take 0.5 tablets (0.25 mg total) by mouth 2 (two) times daily. 09/10/18   Barton Dubois, MD  tamsulosin (FLOMAX) 0.4 MG CAPS capsule Take 1 capsule (0.4 mg total) by mouth daily after supper. 10/11/18   Annita Brod, MD    Family History Family History  Problem Relation Age of Onset   Diabetes Father    Kidney disease Father    Arthritis Mother    Heart disease Mother        CAD   Colon cancer Neg Hx    Esophageal cancer Neg Hx    Inflammatory bowel disease Neg Hx    Liver disease Neg Hx    Pancreatic cancer Neg Hx      Rectal cancer Neg Hx    Stomach cancer Neg Hx     Social History Social History   Tobacco Use   Smoking status: Current Every Day Smoker    Packs/day: 1.00    Years: 51.00    Pack years: 51.00    Types: Cigarettes   Smokeless tobacco: Former Systems developer    Types: Chew    Quit date: 1985  Substance Use Topics   Alcohol use: No    Alcohol/week: 0.0 standard drinks    Comment: 08/14/14 none   Drug use: Yes    Types: Marijuana, Heroin, Cocaine, "Crack" cocaine, Fentanyl, Methylphenidate    Comment: s/p rehab x 6, occasional drug use (per pt last  use was 06/2015)     Allergies   Codeine   Review of Systems Review of Systems  Unable to perform ROS: Mental status change     Physical Exam Updated Vital Signs BP (!) 183/56    Pulse (!) 124    Temp 99.3 F (37.4 C) (Rectal)    Resp (!) 26    SpO2 100%   Physical Exam CONSTITUTIONAL: Disheveled, agitated, appears older than stated age HEAD: Normocephalic/atraumatic EYES: EOMI/PERRL ENMT: Mucous membranes dry NECK: supple no meningeal signs SPINE/BACK:entire spine nontender CV: S1/S2 noted, no murmurs/rubs/gallops noted LUNGS: Lungs are clear to auscultation bilaterally, no apparent distress ABDOMEN: soft, nontender GU - diaper in place NEURO: Pt is awake/alert moves all extremities x4.  Patient is agitated and confused.  Keeps yelling "get out of the road, get out of the road EXTREMITIES: pulses normal/equal, full ROM, no deformities SKIN: warm, color normal PSYCH: unable to assess  ED Treatments / Results  Labs (all labs ordered are listed, but only abnormal results are displayed) Labs Reviewed  CBC WITH DIFFERENTIAL/PLATELET - Abnormal; Notable for the following components:      Result Value   WBC 13.1 (*)    Hemoglobin 11.9 (*)    MCHC 29.8 (*)    Platelets 495 (*)    Neutro Abs 11.1 (*)    Abs Immature Granulocytes 0.09 (*)    All other components within normal limits  COMPREHENSIVE METABOLIC PANEL -  Abnormal; Notable for the following components:   Sodium 120 (*)    Potassium 6.8 (*)    Chloride 82 (*)    CO2 13 (*)    Glucose, Bld 1,401 (*)    BUN 53 (*)    Creatinine, Ser 1.84 (*)  Albumin 3.4 (*)    Alkaline Phosphatase 186 (*)    Total Bilirubin 1.8 (*)    GFR calc non Af Amer 38 (*)    GFR calc Af Amer 45 (*)    Anion gap 25 (*)    All other components within normal limits  LACTIC ACID, PLASMA - Abnormal; Notable for the following components:   Lactic Acid, Venous 5.8 (*)    All other components within normal limits  ACETAMINOPHEN LEVEL - Abnormal; Notable for the following components:   Acetaminophen (Tylenol), Serum <10 (*)    All other components within normal limits  URINALYSIS, ROUTINE W REFLEX MICROSCOPIC - Abnormal; Notable for the following components:   Color, Urine COLORLESS (*)    Glucose, UA >=500 (*)    Ketones, ur 20 (*)    Protein, ur 30 (*)    All other components within normal limits  BASIC METABOLIC PANEL - Abnormal; Notable for the following components:   Sodium 131 (*)    CO2 17 (*)    Glucose, Bld 861 (*)    BUN 50 (*)    Creatinine, Ser 1.56 (*)    GFR calc non Af Amer 47 (*)    GFR calc Af Amer 54 (*)    All other components within normal limits  CBG MONITORING, ED - Abnormal; Notable for the following components:   Glucose-Capillary >600 (*)    All other components within normal limits  POCT I-STAT EG7 - Abnormal; Notable for the following components:   pCO2, Ven 33.3 (*)    Bicarbonate 15.6 (*)    TCO2 17 (*)    Acid-base deficit 10.0 (*)    Sodium 119 (*)    Potassium 6.2 (*)    All other components within normal limits  CBG MONITORING, ED - Abnormal; Notable for the following components:   Glucose-Capillary >600 (*)    All other components within normal limits  CBG MONITORING, ED - Abnormal; Notable for the following components:   Glucose-Capillary >600 (*)    All other components within normal limits  POCT I-STAT EG7 -  Abnormal; Notable for the following components:   pCO2, Ven 24.9 (*)    pO2, Ven 92.0 (*)    Bicarbonate 16.2 (*)    TCO2 17 (*)    Acid-base deficit 7.0 (*)    Sodium 129 (*)    Potassium 5.2 (*)    HCT 32.0 (*)    Hemoglobin 10.9 (*)    All other components within normal limits  CBG MONITORING, ED - Abnormal; Notable for the following components:   Glucose-Capillary >600 (*)    All other components within normal limits  CBG MONITORING, ED - Abnormal; Notable for the following components:   Glucose-Capillary >600 (*)    All other components within normal limits  SARS CORONAVIRUS 2 (HOSPITAL ORDER, [facility])  URINE CULTURE  CULTURE, BLOOD (ROUTINE X 2)  CULTURE, BLOOD (ROUTINE X 2)  CSF CULTURE  GRAM STAIN  HSV CULTURE AND TYPING  AMMONIA  ETHANOL  RAPID URINE DRUG SCREEN, HOSP PERFORMED  SALICYLATE LEVEL  PROTIME-INR  LACTIC ACID, PLASMA  LACTIC ACID, PLASMA  CSF CELL COUNT WITH DIFFERENTIAL  CSF CELL COUNT WITH DIFFERENTIAL  GLUCOSE, CSF  PROTEIN, CSF  HERPES SIMPLEX VIRUS(HSV) DNA BY PCR  CRYPTOCOCCAL ANTIGEN, CSF  ARBOVIRUS IGG, CSF  VDRL, CSF  BASIC METABOLIC PANEL  BASIC METABOLIC PANEL  LACTIC ACID, PLASMA    EKG ED ECG REPORT  Date: 11/11/2018 0152am  Rate: 117  Rhythm: sinus tachycardia  QRS Axis: normal  Intervals: normal  ST/T Wave abnormalities: hyperacute T waves  Conduction Disutrbances:none  Narrative Interpretation:  Artifact noted Abnormal ekg I have personally reviewed the EKG tracing and agree with the computerized printout as noted.  Radiology Ct Head Wo Contrast  Result Date: 11/11/2018 CLINICAL DATA:  Altered mental status. Diabetes with hyperglycemia. Altered level of consciousness. EXAM: CT HEAD WITHOUT CONTRAST TECHNIQUE: Contiguous axial images were obtained from the base of the skull through the vertex without intravenous contrast. COMPARISON:  CT head without contrast 08/23/2018 FINDINGS: Brain:  Moderate diffuse atrophy and white matter disease is present. White matter changes are most evident about the frontal horns of the lateral ventricles bilaterally. There is no significant interval change. No acute infarct, hemorrhage, or mass lesion is present. No significant extraaxial fluid collection is present. Vascular: Atherosclerotic changes are present within the cavernous internal carotid arteries and at the dural margin of both vertebral arteries. There is no hyperdense vessel. Skull: Calvarium is intact. No focal lytic or blastic lesions are present. Sinuses/Orbits: The paranasal sinuses and mastoid air cells are clear. The globes and orbits are within normal limits. IMPRESSION: 1. Stable moderate diffuse atrophy and white matter disease with some part election for the frontal lobes. 2. No acute intracranial abnormality or significant interval change. 3. Atherosclerosis. Electronically Signed   By: San Morelle M.D.   On: 11/11/2018 06:21   Dg Chest Port 1 View  Result Date: 11/11/2018 CLINICAL DATA:  Initial evaluation for acute confusion. EXAM: PORTABLE CHEST 1 VIEW COMPARISON:  Prior radiograph from 10/03/2018 FINDINGS: Cardiac and mediastinal silhouettes are stable in size and contour, and remain within normal limits. Lungs are hypoinflated. No focal infiltrates. No pulmonary edema or definite pleural effusion, although the left costophrenic angle is incompletely visualized. No pneumothorax. No acute osseous finding. Few scattered remotely healed left-sided rib fractures noted. IMPRESSION: No radiographic evidence for active cardiopulmonary disease. Electronically Signed   By: Jeannine Boga M.D.   On: 11/11/2018 02:47    Procedures .Critical Care Performed by: Ripley Fraise, MD Authorized by: Ripley Fraise, MD   Critical care provider statement:    Critical care time (minutes):  90   Critical care start time:  11/11/2018 1:05 AM   Critical care end time:  11/11/2018  3:35 AM   Critical care time was exclusive of:  Separately billable procedures and treating other patients   Critical care was necessary to treat or prevent imminent or life-threatening deterioration of the following conditions:  Cardiac failure, CNS failure or compromise, dehydration, endocrine crisis, renal failure and metabolic crisis   Critical care was time spent personally by me on the following activities:  Evaluation of patient's response to treatment, re-evaluation of patient's condition, pulse oximetry, ordering and review of laboratory studies, ordering and review of radiographic studies, review of old charts, examination of patient, discussions with consultants and ordering and performing treatments and interventions   I assumed direction of critical care for this patient from another provider in my specialty: no    .Lumbar Puncture  Date/Time: 11/11/2018 7:12 AM Performed by: Ripley Fraise, MD Authorized by: Ripley Fraise, MD   Consent:    Consent obtained:  Emergent situation Pre-procedure details:    Procedure purpose:  Diagnostic   Preparation: Patient was prepped and draped in usual sterile fashion   Sedation:    Sedation type:  Anxiolysis Anesthesia (see MAR for exact dosages):    Anesthesia  method:  Local infiltration   Local anesthetic:  Lidocaine 1% w/o epi Procedure details:    Lumbar space:  L3-L4 interspace   Patient position:  Sitting   Needle gauge:  20   Ultrasound guidance: no     Number of attempts:  4   Fluid appearance:  Blood-tinged then clearing   Tubes of fluid:  4   Total volume (ml):  9 Post-procedure:    Puncture site:  Direct pressure applied and adhesive bandage applied   Patient tolerance of procedure:  Tolerated well, no immediate complications Comments:     1st 3 attempts were unsuccessful when patient was lying on side.  We repositioned patient sitting up and I was able to obtain CSF.  Patient tolerated well.  He moves all extremities  x4. Unable to obtain opening pressure.      Medications Ordered in ED Medications  vancomycin (VANCOCIN) 1,500 mg in sodium chloride 0.9 % 500 mL IVPB (has no administration in time range)  vancomycin (VANCOCIN) IVPB 750 mg/150 ml premix (has no administration in time range)  0.9 %  sodium chloride infusion (has no administration in time range)  0.9 %  sodium chloride infusion (has no administration in time range)  dextrose 5 %-0.45 % sodium chloride infusion (has no administration in time range)  insulin regular, human (MYXREDLIN) 100 units/ 100 mL infusion (has no administration in time range)  calcium gluconate 1 g/ 50 mL sodium chloride IVPB (has no administration in time range)  hydrALAZINE (APRESOLINE) injection 5 mg (has no administration in time range)  sodium bicarbonate injection 50 mEq (has no administration in time range)  ampicillin (OMNIPEN) 2 g in sodium chloride 0.9 % 100 mL IVPB (has no administration in time range)  cefTRIAXone (ROCEPHIN) 2 g in sodium chloride 0.9 % 100 mL IVPB (has no administration in time range)  LORazepam (ATIVAN) 2 MG/ML injection (2 mg  Given 11/11/18 0130)  sodium chloride 0.9 % bolus 1,000 mL (0 mLs Intravenous Stopped 11/11/18 0222)  insulin aspart (novoLOG) injection 10 Units (10 Units Intravenous Given 11/11/18 0145)  haloperidol lactate (HALDOL) injection 5 mg (5 mg Intramuscular Given 11/11/18 0145)  sodium chloride 0.9 % bolus 2,000 mL (0 mLs Intravenous Stopped 11/11/18 0308)  cefTRIAXone (ROCEPHIN) 2 g in sodium chloride 0.9 % 100 mL IVPB (0 g Intravenous Stopped 11/11/18 0711)  LORazepam (ATIVAN) injection 1 mg (1 mg Intravenous Given 11/11/18 0545)     Initial Impression / Assessment and Plan / ED Course  I have reviewed the triage vital signs and the nursing notes.  Pertinent labs & imaging results that were available during my care of the patient were reviewed by me and considered in my medical decision making (see chart for  details).        1:05 AM Patient presents for altered mental status and hyperglycemia.  Patient has a history of substance use.  No other details are known on arrival.  Will follow closely 2:17 AM Patient with DKA, severe hyperkalemia, acute kidney injury.  He has been given IV insulin.  Insulin drip has been ordered, and as well as 2 L normal saline.  This should help correct glucose and hyperkalemia Patient with significantly elevated glucose over 1400, this is likely causing his confusion 3:59 AM Vital signs are improving.  Patient's agitation is improving, but he is still requiring safety restraints. Will recheck electrolyte panel to ensure potassium is improving Patient will require admission 5:32 AM Patient now found to be febrile.  Initially it was felt that his altered mental status was due to metabolic encephalopathy, hyperglycemia and acute kidney injury.  However now that he is febrile, we will need to proceed with a lumbar puncture.  Code sepsis is been called. Patient has already received several liters of IV fluid.  He will now receive IV antibiotics.  He will need to undergo CT head and lumbar puncture.  Patient is too altered to give consent.  I attempted to call son via phone, but no answer.  We will proceed on an emergent basis.  He will then be admitted to the hospital 5:46 AM Discussed with Dr. Marlowe Sax for admission.  Plan will be to perform lumbar puncture and then admit to stepdown 7:14 AM Lumbar puncture completed.  Patient tolerated well.  Will be admitted to the hospitalist Final Clinical Impressions(s) / ED Diagnoses   Final diagnoses:  Diabetic ketoacidosis with coma associated with other specified diabetes mellitus ([place])  AKI (acute kidney injury) ([place])  Hyperkalemia  Hyponatremia  Metabolic encephalopathy  Acute febrile illness    ED Discharge Orders    None       Ripley Fraise, MD 11/11/18 [phone]

## 2018-11-11 NOTE — Progress Notes (Signed)
Pt's restraints were not in place when pt arrived to unit per dayshift RN. Discontinued restraint order

## 2018-11-11 NOTE — ED Notes (Signed)
PAGED ADMITTING PER MD

## 2018-11-11 NOTE — Progress Notes (Signed)
Inpatient Diabetes Program Recommendations  AACE/ADA: New Consensus Statement on Inpatient Glycemic Control (2015)  Target Ranges:  Prepandial:   less than 140 mg/dL      Peak postprandial:   less than 180 mg/dL (1-2 hours)      Critically ill patients:  140 - 180 mg/dL   Lab Results  Component Value Date   GLUCAP 180 (H) 11/11/2018   HGBA1C 9.8 (H) 08/13/2018    Review of Glycemic Control Results for [name], [name] (MRN [id number]) as of 11/11/2018 13:21  Ref. Range 11/11/2018 10:46 11/11/2018 12:19  Glucose-Capillary Latest Ref Range: 70 - 99 mg/dL 308 (H) 180 (H)   Diabetes history: Type 1 DM (requires basal and meal coverage) Outpatient Diabetes medications: Novolog 70/30 15 units QAM, 10 units QPM Current orders for Inpatient glycemic control: IV insulin  Inpatient Diabetes Program Recommendations:    Consider repeating A1C, as last result was from 07/2018.  Patient on IV insulin for DKA. When ready to transition (Per MD CO2 >18-20, Anion Gap<12 and drips <3 units/hr) consider the following:  -Levemir 14 units to be given 2 hours prior to discontinuation of IV insulin, then QD to follow. - IF patient to remain NPO, consider Novolog 0-9 units Q4H - IF patient to have diet order placed: Novolog 4 units TID (assuming that patient is consuming >50% of meals), Novolog 0-9 units TID & HS   Thanks, [name] Curb, MSN, RNC-OB Diabetes Coordinator [phone] (8a-5p)

## 2018-11-11 NOTE — ED Notes (Signed)
This tech and Jenny Reichmann, RN placed foley catheter. Peri care was done before insertion.

## 2018-11-11 NOTE — ED Notes (Signed)
ED TO INPATIENT HANDOFF REPORT  ED Nurse Name and Phone #: Annie Main [phone]  S Name/Age/Gender [name] 63 y.o. male Room/Bed: 018C/018C  Code Status   Code Status: Full Code  Home/SNF/Other Home Pt alert but disoriented Is this baseline? No   Triage Complete: Triage complete  Chief Complaint hyperglycemia/AMS  Triage Note Pt arrives via [facility] with c/o hyperglycemia. Reading "high" for EMS. Pt is uncooperative and confused. Roomates last saw him take insulin this morning. IV established and NS initiated.   Allergies Allergies  Allergen Reactions  . Codeine Itching    Tolerates hydrocodone/apap    Level of Care/Admitting Diagnosis ED Disposition    ED Disposition Condition [facility]: [place] [[id number]]  Level of Care: Progressive [102]  Covid Evaluation: Confirmed COVID Negative  Diagnosis: DKA (diabetic ketoacidoses) [facility]) [[id number]]  Admitting Physician: Shela Leff [[id number]]  Attending Physician: Shela Leff [[id number]]  Estimated length of stay: past midnight tomorrow  Certification:: I certify this patient will need inpatient services for at least 2 midnights  PT Class (Do Not Modify): Inpatient [101]  PT Acc Code (Do Not Modify): Private [1]       B Medical/Surgery History Past Medical History:  Diagnosis Date  . DEPRESSION   . DIABETES MELLITUS, TYPE I   . DRUG ABUSE    pt should have NO controlled substances rx'ed  . Glaucoma   . HEPATITIS C    chronic  . Heroin overdose ([place]) 10/03/2018  . Heroin use 10/03/2018  . Hypertension 02/18/2011  . 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 Vitiligo    Past Surgical History:  Procedure Laterality Date  . ABDOMINAL AORTOGRAM W/LOWER EXTREMITY N/A 07/25/2016   Procedure: Abdominal Aortogram w/Bilateral Lower Extremity Runoff;  Surgeon: Conrad Lennox, MD;  Location: [place] [facility];  Service: Cardiovascular;  Laterality: N/A;  . ABDOMINAL  AORTOGRAM W/LOWER EXTREMITY N/A 12/24/2016   Procedure: ABDOMINAL AORTOGRAM W/LOWER EXTREMITY;  Surgeon: Serafina Mitchell, MD;  Location: [place] [facility];  Service: Cardiovascular;  Laterality: N/A;  . AMPUTATION TOE Right 07/26/2016   Procedure: AMPUTATION GREAT TOE;  Surgeon: Serafina Mitchell, MD;  Location: [place];  Service: Vascular;  Laterality: Right;  . BIOPSY  08/25/2018   Procedure: BIOPSY;  Surgeon: Irving Copas., MD;  Location: [place];  Service: Gastroenterology;;  . ESOPHAGOGASTRODUODENOSCOPY N/A 08/25/2018   Procedure: ESOPHAGOGASTRODUODENOSCOPY (EGD);  Surgeon: Irving Copas., MD;  Location: [place];  Service: Gastroenterology;  Laterality: N/A;  . EYE SURGERY     retinal surgery x 2, right eye  . INSERTION OF ILIAC STENT Right 07/26/2016   Procedure: INSERTION OF RIGHT POPLITEAL STENT WITH BALLOON ANGIOPLASTY;  Surgeon: Serafina Mitchell, MD;  Location: [place];  Service: Vascular;  Laterality: Right;  . LOWER EXTREMITY ANGIOGRAM Right 07/26/2016   Procedure: LOWER EXTREMITY ANGIOGRAM;  Surgeon: Serafina Mitchell, MD;  Location: [place];  Service: Vascular;  Laterality: Right;  . PERIPHERAL VASCULAR BALLOON ANGIOPLASTY Right 07/26/2016   Procedure: PERIPHERAL VASCULAR BALLOON ANGIOPLASTY  RIGHT ANTERIOR TIBEAL ARTERY AND RIGHT SUPERFICIAL FEMORAL ARTERY;  Surgeon: Serafina Mitchell, MD;  Location: [place];  Service: Vascular;  Laterality: Right;  . PERIPHERAL VASCULAR INTERVENTION Right 12/24/2016   Procedure: PERIPHERAL VASCULAR INTERVENTION;  Surgeon: Serafina Mitchell, MD;  Location: [place] [facility];  Service: Cardiovascular;  Laterality: Right;  lower extr     A IV Location/Drains/Wounds Patient Lines/Drains/Airways Status   Active Line/Drains/Airways  Name:   Placement date:   Placement time:   Site:   Days:   Peripheral IV 11/11/18 Left;Upper Arm   11/11/18    0129    Arm   less than 1   Peripheral IV 11/11/18 Anterior;Right;Upper Arm   11/11/18    1410     Arm   less than 1          Intake/Output Last 24 hours  Intake/Output Summary (Last 24 hours) at 11/11/2018 1653 Last data filed at 11/11/2018 1335 Gross per 24 hour  Intake 550 ml  Output 1000 ml  Net -450 ml    Labs/Imaging Results for orders placed or performed during the hospital encounter of 11/11/18 (from the past 48 hour(s))  CBG monitoring, ED     Status: Abnormal   Collection Time: 11/11/18 12:36 AM  Result Value Ref Range   Glucose-Capillary >600 (HH) 70 - 99 mg/dL  CBC with Differential/Platelet     Status: Abnormal   Collection Time: 11/11/18  1:03 AM  Result Value Ref Range   WBC 13.1 (H) 4.0 - 10.5 K/uL   RBC 4.29 4.22 - 5.81 MIL/uL   Hemoglobin 11.9 (L) 13.0 - 17.0 g/dL   HCT 40.0 39.0 - 52.0 %   MCV 93.2 80.0 - 100.0 fL   MCH 27.7 26.0 - 34.0 pg   MCHC 29.8 (L) 30.0 - 36.0 g/dL   RDW 14.9 11.5 - 15.5 %   Platelets 495 (H) 150 - 400 K/uL   nRBC 0.0 0.0 - 0.2 %   Neutrophils Relative % 84 %   Neutro Abs 11.1 (H) 1.7 - 7.7 K/uL   Lymphocytes Relative 10 %   Lymphs Abs 1.3 0.7 - 4.0 K/uL   Monocytes Relative 4 %   Monocytes Absolute 0.6 0.1 - 1.0 K/uL   Eosinophils Relative 0 %   Eosinophils Absolute 0.0 0.0 - 0.5 K/uL   Basophils Relative 1 %   Basophils Absolute 0.1 0.0 - 0.1 K/uL   Immature Granulocytes 1 %   Abs Immature Granulocytes 0.09 (H) 0.00 - 0.07 K/uL    Comment: Performed at [facility], [address] [place], [place] [address]  Comprehensive metabolic panel     Status: Abnormal   Collection Time: 11/11/18  1:03 AM  Result Value Ref Range   Sodium 120 (L) 135 - 145 mmol/L   Potassium 6.8 (HH) 3.5 - 5.1 mmol/L    Comment: NO VISIBLE HEMOLYSIS CRITICAL RESULT CALLED TO, READ BACK BY AND VERIFIED WITH: STRAUGHN,K RN 11/11/2018 0204 JORDANS    Chloride 82 (L) 98 - 111 mmol/L   CO2 13 (L) 22 - 32 mmol/L   Glucose, Bld 1,401 (HH) 70 - 99 mg/dL    Comment: CRITICAL RESULT CALLED TO, READ BACK BY AND VERIFIED WITH: STRAUGHN,K RN  11/11/2018 0204 JORDANS RESULTS CONFIRMED BY MANUAL DILUTION CORRECTED ON 07/29 AT 0626: PREVIOUSLY REPORTED AS 1401 CRITICAL RESULT CALLED TO, READ BACK BY AND VERIFIED WITH: STRAUGHN,K RN 11/11/2018 0204 JORDANS    BUN 53 (H) 8 - 23 mg/dL   Creatinine, Ser 1.84 (H) 0.61 - 1.24 mg/dL   Calcium 8.9 8.9 - 10.3 mg/dL   Total Protein 7.8 6.5 - 8.1 g/dL   Albumin 3.4 (L) 3.5 - 5.0 g/dL   AST 37 15 - 41 U/L   ALT 35 0 - 44 U/L   Alkaline Phosphatase 186 (H) 38 - 126 U/L   Total Bilirubin 1.8 (H) 0.3 - 1.2 mg/dL  GFR calc non Af Amer 38 (L) >60 mL/min   GFR calc Af Amer 45 (L) >60 mL/min   Anion gap 25 (H) 5 - 15    Comment: RESULT CHECKED Performed at [facility] [address], [place], [place] [address]   Lactic acid, plasma     Status: Abnormal   Collection Time: 11/11/18  1:03 AM  Result Value Ref Range   Lactic Acid, Venous 5.8 (HH) 0.5 - 1.9 mmol/L    Comment: CRITICAL RESULT CALLED TO, READ BACK BY AND VERIFIED WITH: GIBSON,K RN 11/11/2018 0144 JORDANS Performed at [facility], [place] [address] [place], [place] [address]   Acetaminophen level     Status: Abnormal   Collection Time: 11/11/18  1:03 AM  Result Value Ref Range   Acetaminophen (Tylenol), Serum <10 (L) 10 - 30 ug/mL    Comment: (NOTE) Therapeutic concentrations vary significantly. A range of 10-30 ug/mL  may be an effective concentration for many patients. However, some  are best treated at concentrations outside of this range. Acetaminophen concentrations >150 ug/mL at 4 hours after ingestion  and >50 ug/mL at 12 hours after ingestion are often associated with  toxic reactions. Performed at [facility], [place] [address], [place], [place] [address]   Ethanol     Status: None   Collection Time: 11/11/18  1:03 AM  Result Value Ref Range   Alcohol, Ethyl (B) <10 <10 mg/dL    Comment: (NOTE) Lowest detectable limit for serum alcohol is 10 mg/dL. For medical purposes only. Performed at [facility], [place] [address], [place], [place] [address]   Salicylate level     Status: None   Collection Time: 11/11/18  1:03 AM  Result Value Ref Range   Salicylate Lvl <7.8 2.8 - 30.0 mg/dL    Comment: Performed at [facility] [address] [place], [place] [address]  POCT I-Stat EG7     Status: Abnormal   Collection Time: 11/11/18  1:04 AM  Result Value Ref Range   pH, Ven 7.279 7.250 - 7.430   pCO2, Ven 33.3 (L) 44.0 - 60.0 mmHg   pO2, Ven 38.0 32.0 - 45.0 mmHg   Bicarbonate 15.6 (L) 20.0 - 28.0 mmol/L   TCO2 17 (L) 22 - 32 mmol/L   O2 Saturation 67.0 %   Acid-base deficit 10.0 (H) 0.0 - 2.0 mmol/L   Sodium 119 (LL) 135 - 145 mmol/L   Potassium 6.2 (H) 3.5 - 5.1 mmol/L   Calcium, Ion 1.16 1.15 - 1.40 mmol/L   HCT 40.0 39.0 - 52.0 %   Hemoglobin 13.6 13.0 - 17.0 g/dL   Patient temperature HIDE    Sample type VENOUS    Comment NOTIFIED PHYSICIAN   SARS Coronavirus 2 (CEPHEID - Performed in [place] hospital lab), Hosp Order     Status: None   Collection Time: 11/11/18  1:18 AM   Specimen: Urine, Clean Catch; Nasopharyngeal  Result Value Ref Range   SARS Coronavirus 2 NEGATIVE NEGATIVE    Comment: (NOTE) If result is NEGATIVE SARS-CoV-2 target nucleic acids are NOT DETECTED. The SARS-CoV-2 RNA is generally detectable in upper and lower  respiratory specimens during the acute phase of infection. The lowest  concentration of SARS-CoV-2 viral copies this assay can detect is 250  copies / mL. A negative result does not preclude SARS-CoV-2 infection  and should not be used as the sole basis for treatment or other  patient management decisions.  A negative result may occur with  improper specimen collection / handling, submission of specimen other  than nasopharyngeal swab, presence of viral mutation(s) within the  areas targeted by this assay, and inadequate number of viral copies  (<250 copies / mL). A negative result must be combined with clinical   observations, patient history, and epidemiological information. If result is POSITIVE SARS-CoV-2 target nucleic acids are DETECTED. The SARS-CoV-2 RNA is generally detectable in upper and lower  respiratory specimens dur ing the acute phase of infection.  Positive  results are indicative of active infection with SARS-CoV-2.  Clinical  correlation with patient history and other diagnostic information is  necessary to determine patient infection status.  Positive results do  not rule out bacterial infection or co-infection with other viruses. If result is PRESUMPTIVE POSTIVE SARS-CoV-2 nucleic acids MAY BE PRESENT.   A presumptive positive result was obtained on the submitted specimen  and confirmed on repeat testing.  While 2019 novel coronavirus  (SARS-CoV-2) nucleic acids may be present in the submitted sample  additional confirmatory testing may be necessary for epidemiological  and / or clinical management purposes  to differentiate between  SARS-CoV-2 and other Sarbecovirus currently known to infect humans.  If clinically indicated additional testing with an alternate test  methodology [phone]) is advised. The SARS-CoV-2 RNA is generally  detectable in upper and lower respiratory sp ecimens during the acute  phase of infection. The expected result is Negative. Fact Sheet for Patients:  StrictlyIdeas.no Fact Sheet for Healthcare Providers: BankingDealers.co.za This test is not yet approved or cleared by the [place] FDA and has been authorized for detection and/or diagnosis of SARS-CoV-2 by FDA under an Emergency Use Authorization (EUA).  This EUA will remain in effect (meaning this test can be used) for the duration of the COVID-19 declaration under Section 564(b)(1) of the Act, 21 U.S.C. section 360bbb-3(b)(1), unless the authorization is terminated or revoked sooner. Performed at [facility], [place] [address]  [place], [place] [address]   Ammonia     Status: None   Collection Time: 11/11/18  1:20 AM  Result Value Ref Range   Ammonia 23 9 - 35 umol/L    Comment: Performed at [facility], [place] [address] [place],  [address]  Rapid urine drug screen (hospital performed)     Status: None   Collection Time: 11/11/18  1:20 AM  Result Value Ref Range   Opiates NONE DETECTED NONE DETECTED   Cocaine NONE DETECTED NONE DETECTED   Benzodiazepines NONE DETECTED NONE DETECTED   Amphetamines NONE DETECTED NONE DETECTED   Tetrahydrocannabinol NONE DETECTED NONE DETECTED   Barbiturates NONE DETECTED NONE DETECTED    Comment: (NOTE) DRUG SCREEN FOR MEDICAL PURPOSES ONLY.  IF CONFIRMATION IS NEEDED FOR ANY PURPOSE, NOTIFY LAB WITHIN 5 DAYS. LOWEST DETECTABLE LIMITS FOR URINE DRUG SCREEN Drug Class                     Cutoff (ng/mL) Amphetamine and metabolites    1000 Barbiturate and metabolites    200 Benzodiazepine                 782 Tricyclics and metabolites     300 Opiates and metabolites        300 Cocaine and metabolites        300 THC  50 Performed at [facility], [place] [address], [place], [place] [address]   Urinalysis, Routine w reflex microscopic     Status: Abnormal   Collection Time: 11/11/18  1:20 AM  Result Value Ref Range   Color, Urine COLORLESS (A) YELLOW   APPearance CLEAR CLEAR   Specific Gravity, Urine 1.021 1.005 - 1.030   pH 6.0 5.0 - 8.0   Glucose, UA >=500 (A) NEGATIVE mg/dL   Hgb urine dipstick NEGATIVE NEGATIVE   Bilirubin Urine NEGATIVE NEGATIVE   Ketones, ur 20 (A) NEGATIVE mg/dL   Protein, ur 30 (A) NEGATIVE mg/dL   Nitrite NEGATIVE NEGATIVE   Leukocytes,Ua NEGATIVE NEGATIVE   WBC, UA 0-5 0 - 5 WBC/hpf   Bacteria, UA NONE SEEN NONE SEEN    Comment: Performed at [facility] [address] [place], [place] [address]  CBG monitoring, ED     Status: Abnormal   Collection Time: 11/11/18  2:26 AM  Result Value Ref  Range   Glucose-Capillary >600 (HH) 70 - 99 mg/dL  CBG monitoring, ED     Status: Abnormal   Collection Time: 11/11/18  3:49 AM  Result Value Ref Range   Glucose-Capillary >600 (HH) 70 - 99 mg/dL  POCT I-Stat EG7     Status: Abnormal   Collection Time: 11/11/18  4:38 AM  Result Value Ref Range   pH, Ven 7.423 7.250 - 7.430   pCO2, Ven 24.9 (L) 44.0 - 60.0 mmHg   pO2, Ven 92.0 (H) 32.0 - 45.0 mmHg   Bicarbonate 16.2 (L) 20.0 - 28.0 mmol/L   TCO2 17 (L) 22 - 32 mmol/L   O2 Saturation 97.0 %   Acid-base deficit 7.0 (H) 0.0 - 2.0 mmol/L   Sodium 129 (L) 135 - 145 mmol/L   Potassium 5.2 (H) 3.5 - 5.1 mmol/L   Calcium, Ion 1.20 1.15 - 1.40 mmol/L   HCT 32.0 (L) 39.0 - 52.0 %   Hemoglobin 10.9 (L) 13.0 - 17.0 g/dL   Patient temperature 37.0 C    Sample type VENOUS   CBG monitoring, ED     Status: Abnormal   Collection Time: 11/11/18  4:59 AM  Result Value Ref Range   Glucose-Capillary >600 (HH) 70 - 99 mg/dL  CSF cell count with differential collection tube #: 1     Status: Abnormal   Collection Time: 11/11/18  5:13 AM  Result Value Ref Range   Tube # 1    Color, CSF COLORLESS COLORLESS   Appearance, CSF HAZY (A) CLEAR   Supernatant NOT INDICATED    RBC Count, CSF 520 (H) 0 /cu mm   WBC, CSF 12 (HH) 0 - 5 /cu mm    Comment: CRITICAL RESULT CALLED TO, READ BACK BY AND VERIFIED WITH: C MARSHALL,RN 11/11/2018 0840 WILDERK    Segmented Neutrophils-CSF 79 (H) 0 - 6 %   Lymphs, CSF 6 (L) 40 - 80 %   Monocyte-Macrophage-Spinal Fluid 15 15 - 45 %    Comment: Performed at [facility] [address] [place], [place] [address]  CSF cell count with differential collection tube #: 4     Status: Abnormal   Collection Time: 11/11/18  5:13 AM  Result Value Ref Range   Tube # 4    Color, CSF COLORLESS COLORLESS   Appearance, CSF HAZY (A) CLEAR   Supernatant NOT INDICATED    RBC Count, CSF 16 (H) 0 /cu mm   WBC, CSF 8 (H)  0 - 5 /cu mm   Segmented Neutrophils-CSF FEW 0 - 6 %     Comment: TOO FEW TO COUNT, SMEAR AVAILABLE FOR REVIEW   Lymphs, CSF RARE 40 - 80 %   Monocyte-Macrophage-Spinal Fluid FEW 15 - 45 %    Comment: Performed at [facility] [address] [place], [place] [address]  Glucose, CSF     Status: Abnormal   Collection Time: 11/11/18  5:13 AM  Result Value Ref Range   Glucose, CSF 530 (H) 40 - 70 mg/dL    Comment: Performed at [facility] [address] [place], [place] [address]  Protein, CSF     Status: Abnormal   Collection Time: 11/11/18  5:13 AM  Result Value Ref Range   Total  Protein, CSF 110 (H) 15 - 45 mg/dL    Comment: Performed at [facility] [address] [place], [place] [address]  Cryptococcal antigen, CSF     Status: None   Collection Time: 11/11/18  5:13 AM  Result Value Ref Range   Crypto Ag NEGATIVE NEGATIVE   Cryptococcal Ag Titer NOT INDICATED NOT INDICATED    Comment: Performed at [facility], [address], [place], [place] [address]  CBG monitoring, ED     Status: Abnormal   Collection Time: 11/11/18  6:03 AM  Result Value Ref Range   Glucose-Capillary >600 (HH) 70 - 99 mg/dL  Lactic acid, plasma     Status: Abnormal   Collection Time: 11/11/18  6:26 AM  Result Value Ref Range   Lactic Acid, Venous 3.6 (HH) 0.5 - 1.9 mmol/L    Comment: CRITICAL RESULT CALLED TO, READ BACK BY AND VERIFIED WITH: Dorothea Glassman RN [phone] [id number] BY A BENNETT Performed at [facility], [place] [address] [place], [place] [address]   Protime-INR     Status: None   Collection Time: 11/11/18  6:26 AM  Result Value Ref Range   Prothrombin Time 12.4 11.4 - 15.2 seconds   INR 0.9 0.8 - 1.2    Comment: (NOTE) INR goal varies based on device and disease states. Performed at [facility], [place] [address] [place], [place] [address]   Basic metabolic panel     Status: Abnormal   Collection Time: 11/11/18  6:28 AM  Result Value Ref Range   Sodium 131 (L) 135 - 145 mmol/L   Potassium 4.8 3.5 - 5.1 mmol/L    Chloride 99 98 - 111 mmol/L   CO2 17 (L) 22 - 32 mmol/L   Glucose, Bld 861 (HH) 70 - 99 mg/dL    Comment: CRITICAL RESULT CALLED TO, READ BACK BY AND VERIFIED WITH: C NUCKLES RN [id number] [phone] BY A BENNETT    BUN 50 (H) 8 - 23 mg/dL   Creatinine, Ser 1.56 (H) 0.61 - 1.24 mg/dL   Calcium 9.3 8.9 - 10.3 mg/dL   GFR calc non Af Amer 47 (L) >60 mL/min   GFR calc Af Amer 54 (L) >60 mL/min   Anion gap 15 5 - 15    Comment: Performed at [facility] [address] [place], [place] [address]  CBG monitoring, ED     Status: Abnormal   Collection Time: 11/11/18  7:15 AM  Result Value Ref Range   Glucose-Capillary >600 (HH) 70 - 99 mg/dL   Comment 1 Notify RN    Comment 2 Document in Chart   CSF culture  Status: None (Preliminary result)   Collection Time: 11/11/18  7:17 AM   Specimen: CSF; Cerebrospinal Fluid  Result Value Ref Range   Specimen Description CSF    Special Requests Immunocompromised    Gram Stain      CYTOSPIN SMEAR WBC PRESENT,BOTH PMN AND MONONUCLEAR NO ORGANISMS SEEN Performed at [facility], [address] [place], [place] [address]    Culture PENDING    Report Status PENDING   CBG monitoring, ED     Status: Abnormal   Collection Time: 11/11/18  8:38 AM  Result Value Ref Range   Glucose-Capillary 517 (HH) 70 - 99 mg/dL   Comment 1 Notify RN    Comment 2 Document in Chart   CBG monitoring, ED     Status: Abnormal   Collection Time: 11/11/18  9:41 AM  Result Value Ref Range   Glucose-Capillary 383 (H) 70 - 99 mg/dL   Comment 1 Notify RN    Comment 2 Document in Chart   CBG monitoring, ED     Status: Abnormal   Collection Time: 11/11/18 10:46 AM  Result Value Ref Range   Glucose-Capillary 308 (H) 70 - 99 mg/dL  Basic metabolic panel     Status: Abnormal   Collection Time: 11/11/18 11:00 AM  Result Value Ref Range   Sodium 143 135 - 145 mmol/L   Potassium 3.8 3.5 - 5.1 mmol/L   Chloride 109 98 - 111 mmol/L   CO2 22 22 - 32 mmol/L    Glucose, Bld 317 (H) 70 - 99 mg/dL   BUN 47 (H) 8 - 23 mg/dL   Creatinine, Ser 1.58 (H) 0.61 - 1.24 mg/dL   Calcium 9.6 8.9 - 10.3 mg/dL   GFR calc non Af Amer 46 (L) >60 mL/min   GFR calc Af Amer 54 (L) >60 mL/min   Anion gap 12 5 - 15    Comment: Performed at [facility] [address], [place], [place] [address]  CBG monitoring, ED     Status: Abnormal   Collection Time: 11/11/18 12:19 PM  Result Value Ref Range   Glucose-Capillary 180 (H) 70 - 99 mg/dL   Comment 1 Notify RN    Comment 2 Document in Chart   Lactic acid, plasma     Status: Abnormal   Collection Time: 11/11/18 12:52 PM  Result Value Ref Range   Lactic Acid, Venous 2.5 (HH) 0.5 - 1.9 mmol/L    Comment: CRITICAL RESULT CALLED TO, READ BACK BY AND VERIFIED WITHBerlinda Last RN [id number] [phone] BY A BENNETT Performed at [facility], [place] [address] [place],  [address]   CBG monitoring, ED     Status: Abnormal   Collection Time: 11/11/18  1:35 PM  Result Value Ref Range   Glucose-Capillary 106 (H) 70 - 99 mg/dL   Comment 1 Notify RN    Comment 2 Document in Chart   Basic metabolic panel     Status: Abnormal   Collection Time: 11/11/18  1:53 PM  Result Value Ref Range   Sodium 146 (H) 135 - 145 mmol/L   Potassium 3.8 3.5 - 5.1 mmol/L   Chloride 113 (H) 98 - 111 mmol/L   CO2 22 22 - 32 mmol/L   Glucose, Bld 109 (H) 70 - 99 mg/dL   BUN 46 (H) 8 - 23 mg/dL   Creatinine, Ser 1.49 (H) 0.61 - 1.24 mg/dL   Calcium 9.4 8.9 - 10.3 mg/dL  GFR calc non Af Amer 50 (L) >60 mL/min   GFR calc Af Amer 57 (L) >60 mL/min   Anion gap 11 5 - 15    Comment: Performed at [facility] [address], [place], [place] [address]  CBG monitoring, ED     Status: Abnormal   Collection Time: 11/11/18  2:45 PM  Result Value Ref Range   Glucose-Capillary 61 (L) 70 - 99 mg/dL   Comment 1 Notify RN    Comment 2 Document in Chart   Lactic acid, plasma     Status: None   Collection Time: 11/11/18  3:40 PM  Result  Value Ref Range   Lactic Acid, Venous 1.2 0.5 - 1.9 mmol/L    Comment: Performed at [facility], [place] [address], [place],  [address]  CBG monitoring, ED     Status: Abnormal   Collection Time: 11/11/18  3:53 PM  Result Value Ref Range   Glucose-Capillary 40 (LL) 70 - 99 mg/dL   Comment 1 Call MD NNP PA CNM   CBG monitoring, ED     Status: Abnormal   Collection Time: 11/11/18  4:20 PM  Result Value Ref Range   Glucose-Capillary 127 (H) 70 - 99 mg/dL  CBG monitoring, ED     Status: None   Collection Time: 11/11/18  4:44 PM  Result Value Ref Range   Glucose-Capillary 96 70 - 99 mg/dL   Ct Abdomen Pelvis Wo Contrast  Result Date: 11/11/2018 CLINICAL DATA:  Abdominal pain and swelling EXAM: CT ABDOMEN AND PELVIS WITHOUT CONTRAST TECHNIQUE: Multidetector CT imaging of the abdomen and pelvis was performed following the standard protocol without IV contrast. COMPARISON:  CT 02/02/2018 FINDINGS: Lower chest: Linear scarring versus atelectasis within the lung bases, left greater than right. No pleural effusion. Coronary artery calcifications are noted. Hepatobiliary: 3 cm low-density lesion is again noted within the left hepatic lobe, previously characterized as a hepatic hemangioma. Liver has an otherwise unremarkable noncontrast appearance. No cholelithiasis is evident. No intrahepatic biliary dilatation. Pancreas: Grossly unremarkable noncontrast appearance without evidence of peripancreatic inflammatory changes. Spleen: Scattered punctate calcifications, likely sequela of chronic granulomatous disease. Adrenals/Urinary Tract: Adrenal glands unremarkable. There is marked distention of the urinary bladder and mild bilateral hydroureteronephrosis. 2 mm calculus within the midpole of the right kidney. No definite left-sided rib renal calculus, although there are multiple vascular calcifications within the left renal sinus. Stomach/Bowel: Stomach and bowel are grossly unremarkable. No dilated  loops of bowel. No definite pericolonic inflammatory changes are identified. Appendix not clearly visualized. Vascular/Lymphatic: Extensive aortoiliac vascular calcifications. No lymphadenopathy identified within the abdomen or pelvis. Reproductive: Prostate gland appears grossly unremarkable. Other: No abdominal Mcveigh hernia or abnormality. No abdominopelvic ascites. Musculoskeletal: Subacute appearing fractures of the posterior left tenth, eleventh, and twelfth ribs. IMPRESSION: 1. Marked distention of the urinary bladder and mild bilateral hydroureteronephrosis. Findings raise the possibility for bladder outlet obstruction. Prostate gland does not appear enlarged. 2. There is a small 2 mm calculus within the right kidney. 3. Subacute fractures of the posterior tenth, eleventh, and twelfth ribs on the left. Electronically Signed   By: Davina Poke M.D.   On: 11/11/2018 12:36   Ct Head Wo Contrast  Result Date: 11/11/2018 CLINICAL DATA:  Altered mental status. Diabetes with hyperglycemia. Altered level of consciousness. EXAM: CT HEAD WITHOUT CONTRAST TECHNIQUE: Contiguous axial images were obtained from the base of the skull through the vertex without intravenous contrast. COMPARISON:  CT head without contrast  08/23/2018 FINDINGS: Brain: Moderate diffuse atrophy and white matter disease is present. White matter changes are most evident about the frontal horns of the lateral ventricles bilaterally. There is no significant interval change. No acute infarct, hemorrhage, or mass lesion is present. No significant extraaxial fluid collection is present. Vascular: Atherosclerotic changes are present within the cavernous internal carotid arteries and at the dural margin of both vertebral arteries. There is no hyperdense vessel. Skull: Calvarium is intact. No focal lytic or blastic lesions are present. Sinuses/Orbits: The paranasal sinuses and mastoid air cells are clear. The globes and orbits are within normal  limits. IMPRESSION: 1. Stable moderate diffuse atrophy and white matter disease with some part election for the frontal lobes. 2. No acute intracranial abnormality or significant interval change. 3. Atherosclerosis. Electronically Signed   By: San Morelle M.D.   On: 11/11/2018 06:21   Dg Chest Port 1 View  Result Date: 11/11/2018 CLINICAL DATA:  Initial evaluation for acute confusion. EXAM: PORTABLE CHEST 1 VIEW COMPARISON:  Prior radiograph from 10/03/2018 FINDINGS: Cardiac and mediastinal silhouettes are stable in size and contour, and remain within normal limits. Lungs are hypoinflated. No focal infiltrates. No pulmonary edema or definite pleural effusion, although the left costophrenic angle is incompletely visualized. No pneumothorax. No acute osseous finding. Few scattered remotely healed left-sided rib fractures noted. IMPRESSION: No radiographic evidence for active cardiopulmonary disease. Electronically Signed   By: Jeannine Boga M.D.   On: 11/11/2018 02:47   US Abdomen Limited Ruq  Result Date: 11/11/2018 CLINICAL DATA:  Elevated LFTs EXAM: ULTRASOUND ABDOMEN LIMITED RIGHT UPPER QUADRANT COMPARISON:  CT abdomen 02/02/2018 FINDINGS: Gallbladder: No gallstones or [place] thickening visualized. No sonographic Murphy sign noted by sonographer. Common bile duct: Diameter: 2.7 mm Liver: 2.8 x 1.7 x 3.3 cm heterogeneous hypoechoic mass in the left hepatic lobe unchanged compared with prior CT of the abdomen dated 02/02/2018 with characteristics of a benign hemangioma. Liver parenchyma is normal in echogenicity otherwise. Portal vein is patent on color Doppler imaging with normal direction of blood flow towards the liver. Other: Moderate right hydronephrosis. IMPRESSION: 1. No cholelithiasis or sonographic evidence of acute cholecystitis. 2. Moderate right hydronephrosis. Electronically Signed   By: Kathreen Devoid   On: 11/11/2018 08:57    Pending Labs Unresulted Labs (From admission,  onward)    Start     Ordered   11/12/18 0500  CBC  Tomorrow morning,   R     11/11/18 0629   11/12/18 0500  Comprehensive metabolic panel  Tomorrow morning,   R     11/11/18 0629   11/12/18 0500  Magnesium  Tomorrow morning,   R     11/11/18 1005   11/12/18 0500  Phosphorus  Tomorrow morning,   R     11/11/18 1005   11/12/18 0500  Comprehensive metabolic panel  Tomorrow morning,   R     11/11/18 1005   11/12/18 0500  CBC with Differential/Platelet  Tomorrow morning,   R     11/11/18 1005   11/12/18 0500  Hemoglobin A1c  Tomorrow morning,   R     11/11/18 1510   11/12/18 8182  Basic metabolic panel  Tomorrow morning,   R     11/11/18 1511   11/11/18 0513  Hsv Culture And Typing  [facility] ED ADULT CSF PANEL)  ONCE - STAT,   STAT    Question:  Patient immune status  Answer:  Immunocompromised   11/11/18 0512   11/11/18 9937  Herpes simplex virus (HSV), DNA by PCR Cerebrospinal Fluid  [facility] ED ADULT CSF PANEL)  ONCE - STAT,   STAT     11/11/18 0512   11/11/18 0513  Arbovirus IgG, CSF  [facility] ED ADULT CSF PANEL)  ONCE - STAT,   STAT     11/11/18 0512   11/11/18 0513  VDRL, CSF  [facility] ED ADULT CSF PANEL)  ONCE - STAT,   STAT     11/11/18 0512   11/11/18 0513  Pathologist smear review  Once,   AD     11/11/18 0513   11/11/18 0511  Blood Culture (routine x 2)  BLOOD CULTURE X 2,   STAT     11/11/18 0511   11/11/18 0440  Urine culture  ONCE - STAT,   STAT     11/11/18 0439          Vitals/Pain Today's Vitals   11/11/18 1600 11/11/18 1615 11/11/18 1630 11/11/18 1645  BP: (!) 119/55 (!) 113/50 (!) 108/53 (!) 119/53  Pulse: 73 72 70 70  Resp: (!) 26 19 20    Temp:      TempSrc:      SpO2: 96% 98% 98% 99%  Weight:      PainSc:        Isolation Precautions No active isolations  Medications Medications  vancomycin (VANCOCIN) IVPB 750 mg/150 ml premix (has no administration in time range)  0.9 %  sodium chloride infusion ( Intravenous Not Given 11/11/18 0904)  dextrose 5 %-0.45 %  sodium chloride infusion ( Intravenous Rate/Dose Change 11/11/18 1604)  insulin regular, human (MYXREDLIN) 100 units/ 100 mL infusion (0 Units/hr Intravenous Paused 11/11/18 1440)  hydrALAZINE (APRESOLINE) injection 5 mg (has no administration in time range)  ampicillin (OMNIPEN) 2 g in sodium chloride 0.9 % 100 mL IVPB (0 g Intravenous Stopped 11/11/18 1416)  cefTRIAXone (ROCEPHIN) 2 g in sodium chloride 0.9 % 100 mL IVPB (has no administration in time range)  insulin detemir (LEVEMIR) injection 14 Units (has no administration in time range)  insulin aspart (novoLOG) injection 0-9 Units (has no administration in time range)  dextrose 50 % solution (has no administration in time range)  LORazepam (ATIVAN) 2 MG/ML injection (2 mg  Given 11/11/18 0130)  sodium chloride 0.9 % bolus 1,000 mL (0 mLs Intravenous Stopped 11/11/18 0222)  insulin aspart (novoLOG) injection 10 Units (10 Units Intravenous Given 11/11/18 0145)  haloperidol lactate (HALDOL) injection 5 mg (5 mg Intramuscular Given 11/11/18 0145)  sodium chloride 0.9 % bolus 2,000 mL (0 mLs Intravenous Stopped 11/11/18 0308)  cefTRIAXone (ROCEPHIN) 2 g in sodium chloride 0.9 % 100 mL IVPB (0 g Intravenous Stopped 11/11/18 0711)  vancomycin (VANCOCIN) 1,500 mg in sodium chloride 0.9 % 500 mL IVPB (0 mg Intravenous Stopped 11/11/18 0840)  LORazepam (ATIVAN) injection 1 mg (1 mg Intravenous Given 11/11/18 0545)  calcium gluconate 1 g/ 50 mL sodium chloride IVPB (0 g Intravenous Stopped 11/11/18 0907)  sodium bicarbonate injection 50 mEq (50 mEq Intravenous Given 11/11/18 0851)  dextrose 50 % solution 50 mL (50 mLs Intravenous Given 11/11/18 1558)    Mobility  High fall risk   Focused Assessments    R Recommendations: See Admitting Provider Note  Report given to:   Additional Notes:

## 2018-11-11 NOTE — Progress Notes (Signed)
Pharmacy Antibiotic Note  [name] is a 63 y.o. male admitted on 11/11/2018 with severe hyperglycemia. Pharmacy has been consulted for vancomycin dosing for sepsis and concern for meningitis. Wt unable to be obtained due to agitation, weight estimated ~70kg, CrCl ~45 ml/min.  Plan: Vancomycin 1500mg  IV x1 then 750mg  IV q12h Ceftriaxone 2g IV x1 ordered by EDP - F/U continuing     Temp (24hrs), Avg:100 F (37.8 C), Min:99.3 F (37.4 C), Max:100.6 F (38.1 C)  Recent Labs  Lab 11/11/18 0103  WBC 13.1*  CREATININE 1.84*  LATICACIDVEN 5.8*    CrCl cannot be calculated (Unknown ideal weight.).    Allergies  Allergen Reactions  . Codeine Itching    Tolerates hydrocodone/apap    Antimicrobials this admission: Vancomycin 7/29 >>  Ceftriaxone 7/29 x1  Microbiology results: sent  Thank you for allowing pharmacy to be a part of this patient's care.   Arrie Senate, PharmD, BCPS Clinical Pharmacist Please check AMION for all [place] numbers 11/11/2018

## 2018-11-11 NOTE — ED Notes (Signed)
On pt arrival, pt is confused, unable to cooperate, hittting/scratching staff, trying to get out of bed. Unable to redirect. Gave pt ativan per dr. Christy Gentles order, pt continues with same behavior. He is restless, confused, unable to redirect. Soft restraints x 4 placed with mittens.

## 2018-11-11 NOTE — H&P (Signed)
History and Physical    [name] JJO:[id number] DOB: 1955-06-12 DOA: 11/11/2018  PCP: Patient, No Pcp Per Patient coming from: Home  Chief Complaint: Altered mental status, hyperglycemia  HPI: [name] is a 63 y.o. male with medical history significant of depression, insulin-dependent diabetes mellitus, drug abuse, chronic hep C, hypertension presenting to the hospital via EMS for evaluation of altered mental status and hyperglycemia.  Patient received Ativan in the ED prior to his LP.  Currently very somnolent and no history could be obtained from him.  ED Course: Temperature 100.6 F.  Persistently tachycardic and tachypneic.  Significantly hypertensive on arrival with blood pressure 205/87. White count 13.1 with left shift.  Lactic acid 5.8.  Potassium 6.8.  Blood glucose 1401.  Bicarb 13, anion gap 25.  UA positive for ketones.  BUN 53, creatinine 1.8.  Baseline creatinine 0.9-1.1.  Alkaline phosphatase 186.  T bili 1.8.  AST and ALT normal.  Ethanol, acetaminophen, and salicylate levels normal.  Ammonia normal.  COVID-19 rapid test negative.  Initial VBG with pH 7.27, repeat VBG with pH 7.42.  UDS negative.  UA not suggestive of infection.  Urine culture pending.  Blood culture x2 pending.  Chest x-ray without evidence of active cardiopulmonary disease. Patient continued to be altered and agitated in the ED and required safety restraints.  He received Haldol 5 mg, NovoLog 10 units initially and then started on insulin infusion, ceftriaxone, vancomycin, and 3 L IV fluid boluses.   Head CT pending. Lumbar puncture will be done by ED provider. Admission requested.  Review of Systems:  All systems reviewed and apart from history of presenting illness, are negative.  Past Medical History:  Diagnosis Date   DEPRESSION    DIABETES MELLITUS, TYPE I    DRUG ABUSE    pt should have NO controlled substances rx'ed   Glaucoma    HEPATITIS C    chronic   Heroin overdose ([place])  10/03/2018   Heroin use 10/03/2018   Hypertension 02/18/2011   Proliferative diabetic retinopathy(362.02)    Vitiligo     Past Surgical History:  Procedure Laterality Date   ABDOMINAL AORTOGRAM W/LOWER EXTREMITY N/A 07/25/2016   Procedure: Abdominal Aortogram w/Bilateral Lower Extremity Runoff;  Surgeon: Conrad Winton, MD;  Location: [place] [facility];  Service: Cardiovascular;  Laterality: N/A;   ABDOMINAL AORTOGRAM W/LOWER EXTREMITY N/A 12/24/2016   Procedure: ABDOMINAL AORTOGRAM W/LOWER EXTREMITY;  Surgeon: Serafina Mitchell, MD;  Location: [place] [facility];  Service: Cardiovascular;  Laterality: N/A;   AMPUTATION TOE Right 07/26/2016   Procedure: AMPUTATION GREAT TOE;  Surgeon: Serafina Mitchell, MD;  Location: [place];  Service: Vascular;  Laterality: Right;   BIOPSY  08/25/2018   Procedure: BIOPSY;  Surgeon: Irving Copas., MD;  Location: [place];  Service: Gastroenterology;;   ESOPHAGOGASTRODUODENOSCOPY N/A 08/25/2018   Procedure: ESOPHAGOGASTRODUODENOSCOPY (EGD);  Surgeon: Irving Copas., MD;  Location: [employer];  Service: Gastroenterology;  Laterality: N/A;   EYE SURGERY     retinal surgery x 2, right eye   INSERTION OF ILIAC STENT Right 07/26/2016   Procedure: INSERTION OF RIGHT POPLITEAL STENT WITH BALLOON ANGIOPLASTY;  Surgeon: Serafina Mitchell, MD;  Location: [place];  Service: Vascular;  Laterality: Right;   LOWER EXTREMITY ANGIOGRAM Right 07/26/2016   Procedure: LOWER EXTREMITY ANGIOGRAM;  Surgeon: Serafina Mitchell, MD;  Location: [place];  Service: Vascular;  Laterality: Right;   PERIPHERAL VASCULAR BALLOON ANGIOPLASTY Right 07/26/2016   Procedure: PERIPHERAL  VASCULAR BALLOON ANGIOPLASTY  RIGHT ANTERIOR TIBEAL ARTERY AND RIGHT SUPERFICIAL FEMORAL ARTERY;  Surgeon: Serafina Mitchell, MD;  Location: [facility];  Service: Vascular;  Laterality: Right;   PERIPHERAL VASCULAR INTERVENTION Right 12/24/2016   Procedure: PERIPHERAL VASCULAR INTERVENTION;  Surgeon:  Serafina Mitchell, MD;  Location: [place] [facility];  Service: Cardiovascular;  Laterality: Right;  lower extr     reports that he has been smoking cigarettes. He has a 51.00 pack-year smoking history. He quit smokeless tobacco use about 35 years ago.  His smokeless tobacco use included chew. He reports current drug use. Drugs: Marijuana, Heroin, Cocaine, "Crack" cocaine, Fentanyl, and Methylphenidate. He reports that he does not drink alcohol.  Allergies  Allergen Reactions   Codeine Itching    Tolerates hydrocodone/apap    Family History  Problem Relation Age of Onset   Diabetes Father    Kidney disease Father    Arthritis Mother    Heart disease Mother        CAD   Colon cancer Neg Hx    Esophageal cancer Neg Hx    Inflammatory bowel disease Neg Hx    Liver disease Neg Hx    Pancreatic cancer Neg Hx    Rectal cancer Neg Hx    Stomach cancer Neg Hx     Prior to Admission medications   Medication Sig Start Date End Date Taking? Authorizing Provider  Accu-Chek Softclix Lancets lancets Use to monitor glucose levels BID; E10.29 08/19/18   Renato Shin, MD  amLODipine (NORVASC) 10 MG tablet Take 1 tablet (10 mg total) by mouth daily for 30 days. 08/15/18 10/03/18  Cristal Ford, DO  bacitracin ointment Apply topically 2 (two) times daily. Apply 1/8th of an inch topically to scalp wound twice a day after washing wound with soap & water.  Leave open to air. 10/11/18   Annita Brod, MD  Blood Glucose Calibration (ACCU-CHEK AVIVA) SOLN 1 each by In Vitro route as needed. Use to calibrate meter prn 08/19/18   Renato Shin, MD  blood glucose meter kit and supplies KIT Dispense based on patient and insurance preference. Use up to four times daily as directed. (FOR ICD-9 250.00, 250.01). 08/15/18   Cristal Ford, DO  Blood Glucose Monitoring Suppl (ACCU-CHEK AVIVA PLUS) w/Device KIT 1 each by Does not apply route 2 (two) times a day. Use to monitor glucose levels BID; E10.29  08/19/18   Renato Shin, MD  folic acid (FOLVITE) 1 MG tablet Take 1 tablet (1 mg total) by mouth daily. 09/11/18   Barton Dubois, MD  glucose blood (ACCU-CHEK AVIVA PLUS) test strip Use to monitor glucose levels BID; E10.29 08/19/18   Renato Shin, MD  insulin aspart (NOVOLOG) 100 UNIT/ML injection < 120: No insulin 120-150: 1 unit, 150-200: 2 units, 200-250: 3 units 250-300: 4 units, 300-350: 5 units, 350-400: 7 units 10/11/18   Annita Brod, MD  insulin aspart protamine - aspart (NOVOLOG 70/30 MIX) (70-30) 100 UNIT/ML FlexPen Inject 18 units every morning and 18 units every evening. 10/11/18   Annita Brod, MD  Insulin Pen Needle 31G X 5 MM MISC Use with novolog flex pen 08/15/18   Cristal Ford, DO  Isopropyl Alcohol Wipes 70 % MISC Apply 1 each topically daily as needed. 02/27/18   Medina-Vargas, Monina C, NP  pantoprazole (PROTONIX) 40 MG tablet Take 1 tablet (40 mg total) by mouth 2 (two) times daily. 10/13/18   Mansouraty, Telford Nab., MD  pregabalin (LYRICA) 50 MG capsule  Take 1 capsule (50 mg total) by mouth 2 (two) times daily. 09/10/18   Barton Dubois, MD  risperiDONE (RISPERDAL) 0.5 MG tablet Take 0.5 tablets (0.25 mg total) by mouth 2 (two) times daily. 09/10/18   Barton Dubois, MD  tamsulosin (FLOMAX) 0.4 MG CAPS capsule Take 1 capsule (0.4 mg total) by mouth daily after supper. 10/11/18   Annita Brod, MD    Physical Exam: Vitals:   11/11/18 0530 11/11/18 0536 11/11/18 0630 11/11/18 0700  BP: (!) 181/83  (!) 154/67 (!) 142/63  Pulse: (!) 108  (!) 108 (!) 116  Resp: (!) 26  (!) 24 20  Temp:      TempSrc:      SpO2: 100%  100% 100%  Weight:  70 kg      Physical Exam  Constitutional:  Moaning intermittently  HENT:  Head: Normocephalic.  Eyes: Pupils are equal, round, and reactive to light. Right eye exhibits no discharge. Left eye exhibits no discharge.  Neck: Neck supple.  Cardiovascular: Normal rate, regular rhythm and intact distal pulses.    Pulmonary/Chest: Effort normal. No respiratory distress. He has no wheezes. He has no rales.  Anterior lung fields clear to auscultation  Abdominal: Soft. Bowel sounds are normal. He exhibits no distension. There is no abdominal tenderness. There is no guarding.  Musculoskeletal:        General: No edema.  Neurological:  Very somnolent Not opening eyes, answering any questions, or following commands.  Skin: Skin is warm and dry. He is not diaphoretic.     Labs on Admission: I have personally reviewed following labs and imaging studies  CBC: Recent Labs  Lab 11/11/18 0103 11/11/18 0104 11/11/18 0438  WBC 13.1*  --   --   NEUTROABS 11.1*  --   --   HGB 11.9* 13.6 10.9*  HCT 40.0 40.0 32.0*  MCV 93.2  --   --   PLT 495*  --   --    Basic Metabolic Panel: Recent Labs  Lab 11/11/18 0103 11/11/18 0104 11/11/18 0438 11/11/18 0628  NA 120* 119* 129* 131*  K 6.8* 6.2* 5.2* 4.8  CL 82*  --   --  99  CO2 13*  --   --  17*  GLUCOSE 1,401*  --   --  861*  BUN 53*  --   --  50*  CREATININE 1.84*  --   --  1.56*  CALCIUM 8.9  --   --  9.3   GFR: Estimated Creatinine Clearance: 48.6 mL/min (A) (by C-G formula based on SCr of 1.56 mg/dL (H)). Liver Function Tests: Recent Labs  Lab 11/11/18 0103  AST 37  ALT 35  ALKPHOS 186*  BILITOT 1.8*  PROT 7.8  ALBUMIN 3.4*   No results for input(s): LIPASE, AMYLASE in the last 168 hours. Recent Labs  Lab 11/11/18 0120  AMMONIA 23   Coagulation Profile: Recent Labs  Lab 11/11/18 0626  INR 0.9   Cardiac Enzymes: No results for input(s): CKTOTAL, CKMB, CKMBINDEX, TROPONINI in the last 168 hours. BNP (last 3 results) No results for input(s): PROBNP in the last 8760 hours. HbA1C: No results for input(s): HGBA1C in the last 72 hours. CBG: Recent Labs  Lab 11/11/18 0036 11/11/18 0226 11/11/18 0349 11/11/18 0459 11/11/18 0603  GLUCAP >600* >600* >600* >600* >600*   Lipid Profile: No results for input(s): CHOL, HDL,  LDLCALC, TRIG, CHOLHDL, LDLDIRECT in the last 72 hours. Thyroid Function Tests: No results for input(s): TSH,  T4TOTAL, FREET4, T3FREE, THYROIDAB in the last 72 hours. Anemia Panel: No results for input(s): VITAMINB12, FOLATE, FERRITIN, TIBC, IRON, RETICCTPCT in the last 72 hours. Urine analysis:    Component Value Date/Time   COLORURINE COLORLESS (A) 11/11/2018 0120   APPEARANCEUR CLEAR 11/11/2018 0120   LABSPEC 1.021 11/11/2018 0120   PHURINE 6.0 11/11/2018 0120   GLUCOSEU >=500 (A) 11/11/2018 0120   GLUCOSEU 500 04/28/2012 1149   HGBUR NEGATIVE 11/11/2018 0120   BILIRUBINUR NEGATIVE 11/11/2018 0120   BILIRUBINUR neg 04/28/2012 1053   KETONESUR 20 (A) 11/11/2018 0120   PROTEINUR 30 (A) 11/11/2018 0120   UROBILINOGEN 4.0 (H) 11/16/2013 1646   NITRITE NEGATIVE 11/11/2018 0120   LEUKOCYTESUR NEGATIVE 11/11/2018 0120    Radiological Exams on Admission: Ct Head Wo Contrast  Result Date: 11/11/2018 CLINICAL DATA:  Altered mental status. Diabetes with hyperglycemia. Altered level of consciousness. EXAM: CT HEAD WITHOUT CONTRAST TECHNIQUE: Contiguous axial images were obtained from the base of the skull through the vertex without intravenous contrast. COMPARISON:  CT head without contrast 08/23/2018 FINDINGS: Brain: Moderate diffuse atrophy and white matter disease is present. White matter changes are most evident about the frontal horns of the lateral ventricles bilaterally. There is no significant interval change. No acute infarct, hemorrhage, or mass lesion is present. No significant extraaxial fluid collection is present. Vascular: Atherosclerotic changes are present within the cavernous internal carotid arteries and at the dural margin of both vertebral arteries. There is no hyperdense vessel. Skull: Calvarium is intact. No focal lytic or blastic lesions are present. Sinuses/Orbits: The paranasal sinuses and mastoid air cells are clear. The globes and orbits are within normal limits.  IMPRESSION: 1. Stable moderate diffuse atrophy and white matter disease with some part election for the frontal lobes. 2. No acute intracranial abnormality or significant interval change. 3. Atherosclerosis. Electronically Signed   By: San Morelle M.D.   On: 11/11/2018 06:21   Dg Chest Port 1 View  Result Date: 11/11/2018 CLINICAL DATA:  Initial evaluation for acute confusion. EXAM: PORTABLE CHEST 1 VIEW COMPARISON:  Prior radiograph from 10/03/2018 FINDINGS: Cardiac and mediastinal silhouettes are stable in size and contour, and remain within normal limits. Lungs are hypoinflated. No focal infiltrates. No pulmonary edema or definite pleural effusion, although the left costophrenic angle is incompletely visualized. No pneumothorax. No acute osseous finding. Few scattered remotely healed left-sided rib fractures noted. IMPRESSION: No radiographic evidence for active cardiopulmonary disease. Electronically Signed   By: Jeannine Boga M.D.   On: 11/11/2018 02:47    EKG: Pending at this time.  Assessment/Plan Principal Problem:   DKA (diabetic ketoacidoses) ([name] Active Problems:   AKI (acute kidney injury) ([place])   AMS (altered mental status)   Sepsis ([place])   Hyperkalemia   DKA Likely precipitating factor is infection given fever and signs of sepsis. Blood glucose 1401.  Bicarb 13, anion gap 25.  UA positive for ketones. Initial VBG with pH 7.27, repeat VBG with pH 7.42. -Insulin per DKA protocol -Normal saline at 250 cc/h, switch to D5-1 half normal saline at 100 cc/h when CBG less than 250 -BMP every 4 hours -CBG every 1 hour -Initiate diet and subcutaneous insulin when bicarb normal and gap closes  Sepsis, unclear exact etiology, concern for possible meningitis Temperature 100.6 F.  Persistently tachycardic and tachypneic.  Not hypotensive.  White count 13.1 with left shift.  Lactic acid 5.8.  COVID-19 rapid test negative.  UA not suggestive of infection.  Chest x-ray  without evidence of  pneumonia.  Concern for possible meningitis given fever and altered mental status. -Broad-spectrum antibiotic coverage with vancomycin, cefepime, and ampicillin. -IV fluid resuscitation -LP performed in the ED, CSF analysis labs currently pending -Blood culture x2 pending -Urine culture pending -Continue to trend lactate  Altered mental status Likely secondary to infection/possible meningitis and DKA. Ethanol, acetaminophen, and salicylate levels normal.  Ammonia normal.  UDS negative.  Head CT negative for acute finding.  Significant hyperkalemia Potassium 6.8.  Likely related to metabolic acidosis from DKA and AKI. -Cardiac monitoring -EKG pending -Calcium gluconate -Currently receiving IV insulin for DKA which should improve hyperkalemia -Sodium bicarbonate -Monitor potassium level very closely  AKI BUN 53, creatinine 1.8.  Baseline creatinine 0.9-1.1.  Likely prerenal secondary to dehydration in the setting of DKA and sepsis. -IV fluid hydration -Continue to monitor BMP -Avoid nephrotoxic agents -Monitor urine output  Hypertensive urgency Systolic currently in 979M. -IV hydralazine PRN  Elevated LFTs Alkaline phosphatase 186.  T bili 1.8.  AST and ALT normal.  Abdominal exam benign. -Right upper quadrant ultrasound  DVT prophylaxis: SCDs at this time Code Status: Full code Family Communication: No family available at this time Disposition Plan: Anticipate discharge after clinical improvement. Consults called: None Admission status: It is my clinical opinion that admission to INPATIENT is reasonable and necessary in this 63 y.o. male  presenting with DKA, sepsis of unclear etiology/concern for possible meningitis, altered mental status.  Needs LP and broad-spectrum antibiotics.  Patient also needs IV insulin and IV fluid resuscitation for treatment of DKA.  Given the aforementioned, the predictability of an adverse outcome is felt to be significant.  I expect that the patient will require at least 2 midnights in the hospital to treat this condition.   The medical decision making on this patient was of high complexity and the patient is at high risk for clinical deterioration, therefore this is a level 3 visit.  Shela Leff MD [name] Pager [phone]  If 7PM-7AM, please contact night-coverage [email or web address] Password [email or web address]  11/11/2018, 7:15 AM

## 2018-11-11 NOTE — ED Notes (Signed)
PAGED ADMITTING PER RN  

## 2018-11-11 NOTE — Progress Notes (Signed)
[id number] Patient arrived from ED via stretcher. Patient asleep on arrival and when woken up to transfer to bed became combative grabbing and waving arms at staff. Restraints were on bilateral wrists and ankles but not secured to stretcher. Patient given emotional support with little effect.  Transferred to bed. Patient rolled and skin assessment completed. Removed restraints on ankles but not on wrists because patient was flailing arms around making it impossible to remove at that time. Removed padding and sheets under patient, they were moist, and clean padding placed under patient. Patient feel asleep when left alone after care given. Will evaluate for safety.  Reviewed plan of care for treatment of blood sugars with Dr. Alfredia Ferguson. Report given to oncoming nurse of above information. Pt resting quietly at 1900.

## 2018-11-11 NOTE — ED Notes (Signed)
Notified MD of CSF critical WBC. Tube 1 - 12 Tube 4 - 8

## 2018-11-11 NOTE — ED Triage Notes (Signed)
Pt arrives via [facility] with c/o hyperglycemia. Reading "high" for EMS. Pt is uncooperative and confused. Roomates last saw him take insulin this morning. IV established and NS initiated.

## 2018-11-11 NOTE — ED Notes (Signed)
Patient transported to Ultrasound 

## 2018-11-11 NOTE — ED Notes (Signed)
Pt CBG 106. Notified Jenny Reichmann, Therapist, sports.

## 2018-11-12 DIAGNOSIS — E160 Drug-induced hypoglycemia without coma: Secondary | ICD-10-CM

## 2018-11-12 DIAGNOSIS — T383X5A Adverse effect of insulin and oral hypoglycemic [antidiabetic] drugs, initial encounter: Secondary | ICD-10-CM

## 2018-11-12 DIAGNOSIS — E876 Hypokalemia: Secondary | ICD-10-CM

## 2018-11-12 LAB — COMPREHENSIVE METABOLIC PANEL
ALT: 19 U/L (ref 0–44)
AST: 20 U/L (ref 15–41)
Albumin: 2 g/dL — ABNORMAL LOW (ref 3.5–5.0)
Alkaline Phosphatase: 99 U/L (ref 38–126)
Anion gap: 7 (ref 5–15)
BUN: 30 mg/dL — ABNORMAL HIGH (ref 8–23)
CO2: 23 mmol/L (ref 22–32)
Calcium: 8.7 mg/dL — ABNORMAL LOW (ref 8.9–10.3)
Chloride: 114 mmol/L — ABNORMAL HIGH (ref 98–111)
Creatinine, Ser: 1.3 mg/dL — ABNORMAL HIGH (ref 0.61–1.24)
GFR calc Af Amer: >60 mL/min (ref >60)
GFR calc non Af Amer: 58 mL/min — ABNORMAL LOW (ref >60)
Glucose, Bld: 126 mg/dL — ABNORMAL HIGH (ref 70–99)
Potassium: 3.3 mmol/L — ABNORMAL LOW (ref 3.5–5.1)
Sodium: 144 mmol/L (ref 135–145)
Total Bilirubin: 0.3 mg/dL (ref 0.3–1.2)
Total Protein: 5 g/dL — ABNORMAL LOW (ref 6.5–8.1)

## 2018-11-12 LAB — MRSA PCR SCREENING: MRSA by PCR: NEGATIVE

## 2018-11-12 LAB — CBC WITH DIFFERENTIAL/PLATELET
Abs Immature Granulocytes: 0.08 10*3/uL — ABNORMAL HIGH (ref 0.00–0.07)
Basophils Absolute: 0.1 10*3/uL (ref 0.0–0.1)
Basophils Relative: 1 %
Eosinophils Absolute: 0.2 10*3/uL (ref 0.0–0.5)
Eosinophils Relative: 1 %
HCT: 26 % — ABNORMAL LOW (ref 39.0–52.0)
Hemoglobin: 8.7 g/dL — ABNORMAL LOW (ref 13.0–17.0)
Immature Granulocytes: 1 %
Lymphocytes Relative: 22 %
Lymphs Abs: 3.8 10*3/uL (ref 0.7–4.0)
MCH: 27.9 pg (ref 26.0–34.0)
MCHC: 33.5 g/dL (ref 30.0–36.0)
MCV: 83.3 fL (ref 80.0–100.0)
Monocytes Absolute: 1.4 10*3/uL — ABNORMAL HIGH (ref 0.1–1.0)
Monocytes Relative: 8 %
Neutro Abs: 11.6 10*3/uL — ABNORMAL HIGH (ref 1.7–7.7)
Neutrophils Relative %: 67 %
Platelets: 320 10*3/uL (ref 150–400)
RBC: 3.12 MIL/uL — ABNORMAL LOW (ref 4.22–5.81)
RDW: 14.8 % (ref 11.5–15.5)
WBC: 17.2 10*3/uL — ABNORMAL HIGH (ref 4.0–10.5)
nRBC: 0 % (ref 0.0–0.2)

## 2018-11-12 LAB — GLUCOSE, CAPILLARY
Glucose-Capillary: 111 mg/dL — ABNORMAL HIGH (ref 70–99)
Glucose-Capillary: 126 mg/dL — ABNORMAL HIGH (ref 70–99)
Glucose-Capillary: 147 mg/dL — ABNORMAL HIGH (ref 70–99)
Glucose-Capillary: 152 mg/dL — ABNORMAL HIGH (ref 70–99)
Glucose-Capillary: 157 mg/dL — ABNORMAL HIGH (ref 70–99)
Glucose-Capillary: 165 mg/dL — ABNORMAL HIGH (ref 70–99)
Glucose-Capillary: 171 mg/dL — ABNORMAL HIGH (ref 70–99)
Glucose-Capillary: 217 mg/dL — ABNORMAL HIGH (ref 70–99)
Glucose-Capillary: 229 mg/dL — ABNORMAL HIGH (ref 70–99)
Glucose-Capillary: 36 mg/dL — CL (ref 70–99)
Glucose-Capillary: 38 mg/dL — CL (ref 70–99)
Glucose-Capillary: 42 mg/dL — CL (ref 70–99)
Glucose-Capillary: 69 mg/dL — ABNORMAL LOW (ref 70–99)
Glucose-Capillary: 73 mg/dL (ref 70–99)

## 2018-11-12 LAB — URINE CULTURE

## 2018-11-12 LAB — HEMOGLOBIN A1C
Hgb A1c MFr Bld: 12.3 % — ABNORMAL HIGH (ref 4.8–5.6)
Mean Plasma Glucose: 306.31 mg/dL

## 2018-11-12 LAB — HSV DNA BY PCR (REFERENCE LAB)
HSV 1 DNA: NEGATIVE
HSV 2 DNA: NEGATIVE

## 2018-11-12 LAB — MAGNESIUM: Magnesium: 2.1 mg/dL (ref 1.7–2.4)

## 2018-11-12 LAB — VDRL, CSF: VDRL Quant, CSF: NONREACTIVE

## 2018-11-12 LAB — PHOSPHORUS: Phosphorus: 2.6 mg/dL (ref 2.5–4.6)

## 2018-11-12 MED ORDER — DEXTROSE 50 % IV SOLN: 1.0000 | Freq: Once | INTRAVENOUS | Status: AC

## 2018-11-12 MED ORDER — TRAMADOL HCL 50 MG PO TABS
50.0000 mg | ORAL_TABLET | Freq: Two times a day (BID) | ORAL | Status: DC | PRN
  Administered 2018-11-12 – 2018-11-24 (×18): 50 mg via ORAL
  Filled 2018-11-12 (×19): qty 1

## 2018-11-12 MED ORDER — POTASSIUM CHLORIDE 10 MEQ/100ML IV SOLN
10.0000 meq | INTRAVENOUS | Status: DC
  Filled 2018-11-12: qty 100

## 2018-11-12 MED ORDER — DEXTROSE 50 % IV SOLN
1.0000 | Freq: Once | INTRAVENOUS | Status: AC
  Administered 2018-11-12: 01:00:00 25 mL via INTRAVENOUS

## 2018-11-12 MED ORDER — DEXTROSE 50 % IV SOLN
INTRAVENOUS | Status: AC
  Filled 2018-11-12: qty 50

## 2018-11-12 MED ORDER — HYDROCODONE-ACETAMINOPHEN 5-325 MG PO TABS
2.0000 | ORAL_TABLET | Freq: Once | ORAL | Status: AC
  Administered 2018-11-12: 2 via ORAL
  Filled 2018-11-12: qty 2

## 2018-11-12 MED ORDER — DEXTROSE 50 % IV SOLN
INTRAVENOUS | Status: AC
  Administered 2018-11-12: 09:00:00
  Filled 2018-11-12: qty 50

## 2018-11-12 MED ORDER — PANTOPRAZOLE SODIUM 40 MG PO TBEC
40.0000 mg | DELAYED_RELEASE_TABLET | Freq: Two times a day (BID) | ORAL | Status: DC
  Administered 2018-11-12 – 2018-11-24 (×23): 40 mg via ORAL
  Filled 2018-11-12 (×23): qty 1

## 2018-11-12 MED ORDER — DEXTROSE 50 % IV SOLN
INTRAVENOUS | Status: AC
  Administered 2018-11-12: 04:00:00
  Filled 2018-11-12: qty 50

## 2018-11-12 MED ORDER — FOLIC ACID 1 MG PO TABS
1.0000 mg | ORAL_TABLET | Freq: Every day | ORAL | Status: DC
  Administered 2018-11-12 – 2018-11-24 (×13): 1 mg via ORAL
  Filled 2018-11-12 (×13): qty 1

## 2018-11-12 MED ORDER — DEXTROSE 10 % IV SOLN
INTRAVENOUS | Status: DC
  Administered 2018-11-12 – 2018-11-13 (×3): via INTRAVENOUS

## 2018-11-12 MED ORDER — TAMSULOSIN HCL 0.4 MG PO CAPS
0.4000 mg | ORAL_CAPSULE | Freq: Every day | ORAL | Status: DC
  Administered 2018-11-12 – 2018-11-24 (×13): 0.4 mg via ORAL
  Filled 2018-11-12 (×13): qty 1

## 2018-11-12 MED ORDER — POTASSIUM CHLORIDE 10 MEQ/100ML IV SOLN
10.0000 meq | INTRAVENOUS | Status: AC
  Administered 2018-11-12 (×4): 10 meq via INTRAVENOUS
  Filled 2018-11-12 (×3): qty 100

## 2018-11-12 NOTE — Progress Notes (Addendum)
PROGRESS NOTE    SYLVIA KONDRACKI  UYQ:[id number] DOB: 27-Jun-1955 DOA: 11/11/2018 PCP: Patient, No Pcp Per   Brief Narrative:  The patient is a 63 year old Caucasian male with a past medical history significant for but not limited to depression, insulin-dependent diabetes mellitus, drug abuse, hepatitis C which is chronic, hypertension, as well as other comorbidities who presented to the emergency room for evaluation for AMS as well as hyperglycemia.  He initially received Ativan prior to obtaining LP and remained somnolent and a history was not obtainable as he remains somnolent and encephalopathic.  Patient is DKA improved and gap closed however insulin drip continued and patient became hypoglycemic.  He recurrently became hypoglycemic and was started on a D10 drip.  Mentation is a little improved per nursing and he is more awake today.  Case was discussed with neurology Dr. Malen Gauze yesterday who recommended discontinuing antibiotics for bacterial meningitis and starting the patient on acyclovir for possible viral meningitis.  However this could be reactive to his DKA and will need to continue monitor.  Patient has no longer been febrile and white blood cell count is trending down.  If white blood cell count trends down even further and patient has remained afebrile we will discontinue bacterial meningitis antibiotics in a.m. and await further HSV PCR prior to discontinuing acyclovir.  Currently remains on a D10 drip and has been placed on a clear liquid diet and SLP will come evaluate the patient.  Renal function is trending down after heavy acute urinary retention and Foley catheter placement and will obtain a renal ultrasound in the a.m.  Assessment & Plan:   Principal Problem:   DKA (diabetic ketoacidoses) ([place]) Active Problems:   AKI (acute kidney injury) ([place])   AMS (altered mental status)   Sepsis ([place])   Hyperkalemia  DKA  Uncontrolled Diabetes Mellitus Type 1 -Likely precipitating factor is  infection given fever and signs of sepsis.  -Blood glucose 1401 on admission and has had periods of significant hypoglycemia so started on a D10 drip and insulin drip was stopped.  He was transitioned to 5 units of Levemir and placed on a sensitive NovoLog sliding scale every 4 -On admissionBicarb 13, anion gap 25.   Now anion gap is 7 and bicarbonate is 23-UA positive for ketones. Initial VBG with pH 7.27, repeat VBG with pH 7.42. -Insulin per DKA protocol  now stopped -IV fluids with normal saline and D5 half-normal saline has been stopped and patient is now on a D10 drip because of persistent hypoglycemia -Patient CBG have been ranging from 42-217 -CBG's are improving and Last CBG was 217 -Patient hemoglobin A1c was 12.3 -CBG every 4 hours and more frequently if necessary -Initiate diet and subcutaneous insulin when bicarb normal and gap closes and when he is more awake; have placed the patient on clear liquid diet -We will obtain speech consultation to ensure the patient can probably swallow  Sepsis, unclear exact etiology, concern for possible meningitis vs. GI infection  -Temperature 100.6 F on admission has not been febrile since -Persistently tachycardic and tachypneic. Not hypotensive.  -White count 13.1 with left shift and trended up to 21,000 but is now 17,200 -Lactic acid 5.8 and repeat trending down to 1.2  -COVID-19 rapid test negative.  -Check MRSA PCR -UA not suggestive of infection.  -Chest x-ray without evidence of pneumonia.  -Concern for possible meningitis given fever and altered mental status. -Check CT Abd/Pelvis as patient had a distended and firm Abdomen -Broad-spectrum antibiotic coverage  with vancomycin, cefepime, and ampicillin.  I added acyclovir for possible viral meningitis after discussion with neurology and will continue antibiotics for today and discontinue if patient has not spiked any more temperatures -IV fluid resuscitation -LP performedin the  ED, CSF analysislabs sent; and I discussed the case with Dr. Rory Percy of neurology who feels this could be a viral meningitis and less likely bacterial meningitis however we will continue antibiotics for possible sepsis unclear etiology and will obtain MRSA PCR as well -Blood culture x2 showed no growth at 1 day -Urine culture showed multiple species present -CSF culture showed no organisms seen and no growth in 1 day -Continued to trend lactate and now that is improved we will not trend -Continue to Monitor Closely  and will repeat CBC and monitor temperature curve  Acute Encephalopathy slowly improving -Likely secondary to infection/possible meningitis and DKA.  -Ethanol, acetaminophen, and salicylate levels normal.  -Ammonia normal.  -UDS negative. -Head CT negative for acute finding. -Discussed the case with neurology and patient could have a viral meningitis but for now unclear etiology of sepsis and will continue empiric antibiotics and antivirals and if WBC trended off further tomorrow we will discontinue antibiotics for bacterial meningitis and continue acyclovir until HSV PCR is back  SignificantHyperkalemia, is now hypokalemic in the setting of D10 drip -Potassium 6.8. Likely related to metabolic acidosis from DKA and AKI. -Cardiac monitoring -EKG pending  -Calcium gluconate given -Was receiving IV insulin for DKA which should improve hyperkalemia but now is hypokalemic and being replete -Sodium bicarbonate -Monitor potassium level very closely -Replete as necessary -Repeat CMP in a.m.  AKI in the setting of bladder outlet obstruction -BUN 53, creatinine 1.8 on Admission. Baseline creatinine 0.9-1.1.  -Initially thought to be likely prerenal secondary to dehydration in the setting of DKA and sepsis. -C/w IV fluid hydration but changed to D10 drip given persistent hypoglycemia -BUN/Cr went from 50/1.56 and is now  30/1.30 -Continue to monitor and trend renal  function -Avoid Nephrotoxic agents and Medications, Contrast Dyes, and Hypotension -Continue to Monitor urine output Closely  -Foley catheter was placed for strict I's and O's and because of acute urine retention  -Will start Tamsulosin -Repeat CMP in a.m.  Hypertensive Urgency -Systolics were in the 094B and have improved; the pressure is now 150/69 -IV hydralazine PRN   Elevated LFTs Hyperbilirubinemia -Alkaline phosphatase 186 on admission and is now 99.  -T bili 1.8 and is now 0.3. AST and ALT normal. Abdominal exam benign but he did have a lot of urinary distention yesterday -Right upper quadrant ultrasound showed no cholelithiasis or sonographic evidence of acute cholecystitis there is also moderate right hydronephrosis noted and a 2.8 x 1.7 x 2.3 cm heterogenous hypoechoic mass in the left hepatic lobe is unchanged from 02/02/2018 with characteristics of a benign hemangioma  Hyponatremia and Pseudohyponatremia likely secondary to hyperglycemia; improved -Patient sodium was 119 on admission is now 144 -Continue with IV fluid hydration as above and D5 half-normal saline has now been stopped and patient is now on a D10 drip because of persistent hypoglycemia -Continue monitor and trend CMP and BMPs.  Acute Urinary Retention -CT of the abdomen pelvis without contrast showed distention of the urinary bladder and mild bilateral hydroureteronephrosis with raising the possibility for bladder outlet obstruction.  The prostate gland did not appear enlarged.  There is also a small 2 mm calculus with the right kidney -Foley catheter was placed -renal function is trending down -Patient has good urine output -  We will obtain a renal ultrasound in the a.m. -We will also start the patient on tamsulosin 0.4 mg p.o. nightly   Subacute rib fractures of the posterior 10th 11th and 12th ribs on the left -Once patient is more awake we will continue with pain control but for now we will continue  with acetaminophen  Normocytic Anemia For patient's hemoglobin/hematocrit has been dropping likely was hemoconcentrated on admission -Normal/hematocrit is 9.0/22.0 -Continue to monitor for signs and symptoms of bleeding; currently no overt bleeding noted -Check anemia panel in the a.m. -Repeat CBC in a.m.  DVT prophylaxis: SCDs Code Status: FULL CODE  Family Communication: No family present at bedside  Disposition Plan: (specify when and where you expect patient to be discharged). Include barriers to DC in this tab.  Consultants:   Discussed the case with Dr. Rory Percy of Neurology   Procedures:  None  Antimicrobials:  Anti-infectives (From admission, onward)   Start     Dose/Rate Route Frequency Ordered Stop   11/11/18 2230  acyclovir (ZOVIRAX) 700 mg in dextrose 5 % 100 mL IVPB     10 mg/kg  70 kg 114 mL/hr over 60 Minutes Intravenous Every 8 hours 11/11/18 2202     11/11/18 1830  vancomycin (VANCOCIN) IVPB 750 mg/150 ml premix     750 mg 150 mL/hr over 60 Minutes Intravenous Every 12 hours 11/11/18 0520     11/11/18 1700  cefTRIAXone (ROCEPHIN) 2 g in sodium chloride 0.9 % 100 mL IVPB     2 g 200 mL/hr over 30 Minutes Intravenous Every 12 hours 11/11/18 0642     11/11/18 0800  ampicillin (OMNIPEN) 2 g in sodium chloride 0.9 % 100 mL IVPB     2 g 300 mL/hr over 20 Minutes Intravenous Every 4 hours 11/11/18 0629     11/11/18 0630  vancomycin (VANCOCIN) 1,500 mg in sodium chloride 0.9 % 500 mL IVPB     1,500 mg 250 mL/hr over 120 Minutes Intravenous  Once 11/11/18 0520 11/11/18 0840   11/11/18 0515  cefTRIAXone (ROCEPHIN) 2 g in sodium chloride 0.9 % 100 mL IVPB     2 g 200 mL/hr over 30 Minutes Intravenous  Once 11/11/18 0511 11/11/18 0711   11/11/18 0515  vancomycin (VANCOCIN) IVPB 1000 mg/200 mL premix  Status:  Discontinued     1,000 mg 200 mL/hr over 60 Minutes Intravenous  Once 11/11/18 0511 11/11/18 0516     Subjective: Seen and examined at bedside he was a little  more awake today and staff states that he was also more combative.  He had been sleeping most of the morning and I woke him up from his sleep and he answered 1 of my questions but went right back to sleep.  No nausea or vomiting.  Staff has blood sugars have improved after restarting the D10 drip.  No other concerns recommended at this time.  Objective: Vitals:   11/12/18 0403 11/12/18 0811 11/12/18 1121 11/12/18 1525  BP: (!) 113/44 (!) 110/49 (!) 126/48 (!) 150/69  Pulse: 64 66 68 73  Resp: 17 18 17 12   Temp: 97.9 F (36.6 C) 97.6 F (36.4 C) (!) 97.5 F (36.4 C) 97.7 F (36.5 C)  TempSrc: Oral Axillary Axillary Oral  SpO2: 100% 97% 100% 99%  Weight: 60.7 kg       Intake/Output Summary (Last 24 hours) at 11/12/2018 1837 Last data filed at 11/12/2018 1525 Gross per 24 hour  Intake 2339.19 ml  Output 2050 ml  Net 289.19 ml   Filed Weights   11/11/18 0536 11/12/18 0403  Weight: 70 kg 60.7 kg   Examination: Physical Exam:  Constitutional: Thin Caucasian male in NAD and appears calm and comfortable sleeping Eyes: Lids and conjunctivae normal, sclerae anicteric  ENMT: External Ears, Nose appear normal. Grossly normal hearing. Neck: Appears normal, supple, no cervical masses, normal ROM, no appreciable thyromegaly Respiratory: Diminished to auscultation bilaterally, no wheezing, rales, rhonchi or crackles. Normal respiratory effort and patient is not tachypenic. No accessory muscle use. Unlabored Breathing  Cardiovascular: RRR, no murmurs / rubs / gallops. S1 and S2 auscultated. No extremity edema.  Abdomen: Soft, non-tender, non-distended. No masses palpated. No appreciable hepatosplenomegaly. Bowel sounds positive x4.  GU: Deferred. Has a foley catheter in place Musculoskeletal: No clubbing / cyanosis of digits/nails. No joint deformity upper and lower extremities.  Skin: No rashes, lesions, ulcers on a limited skin evaluation. No induration; Warm and dry.  Neurologic: He arouses  briefly to physical and verbal stimuli but will bite back to sleep Romberg sign and cerebellar reflexes not assessed.  Psychiatric: Impaired judgment and insight.  Somnolent and slightly drowsy.   Data Reviewed: I have personally reviewed following labs and imaging studies  CBC: Recent Labs  Lab 11/11/18 0103 11/11/18 0104 11/11/18 0438 11/11/18 2139 11/12/18 0420  WBC 13.1*  --   --  21.1* 17.2*  NEUTROABS 11.1*  --   --  14.8* 11.6*  HGB 11.9* 13.6 10.9* 9.3* 8.7*  HCT 40.0 40.0 32.0* 28.5* 26.0*  MCV 93.2  --   --  83.3 83.3  PLT 495*  --   --  333 409   Basic Metabolic Panel: Recent Labs  Lab 11/11/18 0628 11/11/18 1100 11/11/18 1353 11/11/18 2139 11/12/18 0420  NA 131* 143 146* 146* 144  K 4.8 3.8 3.8 4.4 3.3*  CL 99 109 113* 114* 114*  CO2 17* 22 22 24 23   GLUCOSE 861* 317* 109* 75 126*  BUN 50* 47* 46* 39* 30*  CREATININE 1.56* 1.58* 1.49* 1.35* 1.30*  CALCIUM 9.3 9.6 9.4 9.1 8.7*  MG  --   --   --  2.3 2.1  PHOS  --   --   --  3.6 2.6   GFR: Estimated Creatinine Clearance: 50.6 mL/min (A) (by C-G formula based on SCr of 1.3 mg/dL (H)). Liver Function Tests: Recent Labs  Lab 11/11/18 0103 11/11/18 2139 11/12/18 0420  AST 37 22 20  ALT 35 21 19  ALKPHOS 186* 117 99  BILITOT 1.8* 0.7 0.3  PROT 7.8 5.9* 5.0*  ALBUMIN 3.4* 2.3* 2.0*   No results for input(s): LIPASE, AMYLASE in the last 168 hours. Recent Labs  Lab 11/11/18 0120  AMMONIA 23   Coagulation Profile: Recent Labs  Lab 11/11/18 0626  INR 0.9   Cardiac Enzymes: No results for input(s): CKTOTAL, CKMB, CKMBINDEX, TROPONINI in the last 168 hours. BNP (last 3 results) No results for input(s): PROBNP in the last 8760 hours. HbA1C: Recent Labs    11/12/18 0420  HGBA1C 12.3*   CBG: Recent Labs  Lab 11/12/18 0858 11/12/18 1023 11/12/18 1209 11/12/18 1355 11/12/18 1600  GLUCAP 152* 157* 147* 165* 217*   Lipid Profile: No results for input(s): CHOL, HDL, LDLCALC, TRIG, CHOLHDL,  LDLDIRECT in the last 72 hours. Thyroid Function Tests: No results for input(s): TSH, T4TOTAL, FREET4, T3FREE, THYROIDAB in the last 72 hours. Anemia Panel: No results for input(s): VITAMINB12, FOLATE, FERRITIN, TIBC, IRON, RETICCTPCT in the last 72  hours. Sepsis Labs: Recent Labs  Lab 11/11/18 0103 11/11/18 0626 11/11/18 1252 11/11/18 1540  LATICACIDVEN 5.8* 3.6* 2.5* 1.2    Recent Results (from the past 240 hour(s))  SARS Coronavirus 2 (CEPHEID - Performed in [facility] lab), Hosp Order     Status: None   Collection Time: 11/11/18  1:18 AM   Specimen: Urine, Clean Catch; Nasopharyngeal  Result Value Ref Range Status   SARS Coronavirus 2 NEGATIVE NEGATIVE Final    Comment: (NOTE) If result is NEGATIVE SARS-CoV-2 target nucleic acids are NOT DETECTED. The SARS-CoV-2 RNA is generally detectable in upper and lower  respiratory specimens during the acute phase of infection. The lowest  concentration of SARS-CoV-2 viral copies this assay can detect is 250  copies / mL. A negative result does not preclude SARS-CoV-2 infection  and should not be used as the sole basis for treatment or other  patient management decisions.  A negative result may occur with  improper specimen collection / handling, submission of specimen other  than nasopharyngeal swab, presence of viral mutation(s) within the  areas targeted by this assay, and inadequate number of viral copies  (<250 copies / mL). A negative result must be combined with clinical  observations, patient history, and epidemiological information. If result is POSITIVE SARS-CoV-2 target nucleic acids are DETECTED. The SARS-CoV-2 RNA is generally detectable in upper and lower  respiratory specimens dur ing the acute phase of infection.  Positive  results are indicative of active infection with SARS-CoV-2.  Clinical  correlation with patient history and other diagnostic information is  necessary to determine patient infection  status.  Positive results do  not rule out bacterial infection or co-infection with other viruses. If result is PRESUMPTIVE POSTIVE SARS-CoV-2 nucleic acids MAY BE PRESENT.   A presumptive positive result was obtained on the submitted specimen  and confirmed on repeat testing.  While 2019 novel coronavirus  (SARS-CoV-2) nucleic acids may be present in the submitted sample  additional confirmatory testing may be necessary for epidemiological  and / or clinical management purposes  to differentiate between  SARS-CoV-2 and other Sarbecovirus currently known to infect humans.  If clinically indicated additional testing with an alternate test  methodology [phone]) is advised. The SARS-CoV-2 RNA is generally  detectable in upper and lower respiratory sp ecimens during the acute  phase of infection. The expected result is Negative. Fact Sheet for Patients:  StrictlyIdeas.no Fact Sheet for Healthcare Providers: BankingDealers.co.za This test is not yet approved or cleared by the [place] FDA and has been authorized for detection and/or diagnosis of SARS-CoV-2 by FDA under an Emergency Use Authorization (EUA).  This EUA will remain in effect (meaning this test can be used) for the duration of the COVID-19 declaration under Section 564(b)(1) of the Act, 21 U.S.C. section 360bbb-3(b)(1), unless the authorization is terminated or revoked sooner. Performed at [facility], [place] [address], [place], [place] [address]   Urine culture     Status: Abnormal   Collection Time: 11/11/18  1:20 AM   Specimen: Urine, Catheterized  Result Value Ref Range Status   Specimen Description URINE, CATHETERIZED  Final   Special Requests   Final    NONE Performed at [facility], [place] [address], [place], [place] [address]    Culture MULTIPLE SPECIES PRESENT, SUGGEST RECOLLECTION (A)  Final   Report Status 11/12/2018 FINAL  Final  Blood Culture  (routine x 2)     Status: None (Preliminary result)  Collection Time: 11/11/18  6:26 AM   Specimen: BLOOD  Result Value Ref Range Status   Specimen Description BLOOD LEFT ARM  Final   Special Requests   Final    BOTTLES DRAWN AEROBIC AND ANAEROBIC Blood Culture adequate volume   Culture   Final    NO GROWTH 1 DAY Performed at [facility], [address] [place], [place] [address]    Report Status PENDING  Incomplete  Blood Culture (routine x 2)     Status: None (Preliminary result)   Collection Time: 11/11/18  6:26 AM   Specimen: BLOOD  Result Value Ref Range Status   Specimen Description BLOOD LEFT ARM  Final   Special Requests   Final    BOTTLES DRAWN AEROBIC ONLY Blood Culture results may not be optimal due to an excessive volume of blood received in culture bottles   Culture   Final    NO GROWTH 1 DAY Performed at [facility], [place] [address] [place], [place] [address]    Report Status PENDING  Incomplete  CSF culture     Status: None (Preliminary result)   Collection Time: 11/11/18  7:17 AM   Specimen: CSF; Cerebrospinal Fluid  Result Value Ref Range Status   Specimen Description CSF  Final   Special Requests Immunocompromised  Final   Gram Stain   Final    CYTOSPIN SMEAR WBC PRESENT,BOTH PMN AND MONONUCLEAR NO ORGANISMS SEEN    Culture   Final    NO GROWTH 1 DAY Performed at [facility], [place] [address] [place], [place] [address]    Report Status PENDING  Incomplete    Radiology Studies: Ct Abdomen Pelvis Wo Contrast  Result Date: 11/11/2018 CLINICAL DATA:  Abdominal pain and swelling EXAM: CT ABDOMEN AND PELVIS WITHOUT CONTRAST TECHNIQUE: Multidetector CT imaging of the abdomen and pelvis was performed following the standard protocol without IV contrast. COMPARISON:  CT 02/02/2018 FINDINGS: Lower chest: Linear scarring versus atelectasis within the lung bases, left greater than right. No pleural effusion. Coronary artery calcifications are  noted. Hepatobiliary: 3 cm low-density lesion is again noted within the left hepatic lobe, previously characterized as a hepatic hemangioma. Liver has an otherwise unremarkable noncontrast appearance. No cholelithiasis is evident. No intrahepatic biliary dilatation. Pancreas: Grossly unremarkable noncontrast appearance without evidence of peripancreatic inflammatory changes. Spleen: Scattered punctate calcifications, likely sequela of chronic granulomatous disease. Adrenals/Urinary Tract: Adrenal glands unremarkable. There is marked distention of the urinary bladder and mild bilateral hydroureteronephrosis. 2 mm calculus within the midpole of the right kidney. No definite left-sided rib renal calculus, although there are multiple vascular calcifications within the left renal sinus. Stomach/Bowel: Stomach and bowel are grossly unremarkable. No dilated loops of bowel. No definite pericolonic inflammatory changes are identified. Appendix not clearly visualized. Vascular/Lymphatic: Extensive aortoiliac vascular calcifications. No lymphadenopathy identified within the abdomen or pelvis. Reproductive: Prostate gland appears grossly unremarkable. Other: No abdominal Myhre hernia or abnormality. No abdominopelvic ascites. Musculoskeletal: Subacute appearing fractures of the posterior left tenth, eleventh, and twelfth ribs. IMPRESSION: 1. Marked distention of the urinary bladder and mild bilateral hydroureteronephrosis. Findings raise the possibility for bladder outlet obstruction. Prostate gland does not appear enlarged. 2. There is a small 2 mm calculus within the right kidney. 3. Subacute fractures of the posterior tenth, eleventh, and twelfth ribs on the left. Electronically Signed   By: Davina Poke M.D.   On: 11/11/2018 12:36   Ct Head Wo Contrast  Result Date: 11/11/2018 CLINICAL  DATA:  Altered mental status. Diabetes with hyperglycemia. Altered level of consciousness. EXAM: CT HEAD WITHOUT CONTRAST TECHNIQUE:  Contiguous axial images were obtained from the base of the skull through the vertex without intravenous contrast. COMPARISON:  CT head without contrast 08/23/2018 FINDINGS: Brain: Moderate diffuse atrophy and white matter disease is present. White matter changes are most evident about the frontal horns of the lateral ventricles bilaterally. There is no significant interval change. No acute infarct, hemorrhage, or mass lesion is present. No significant extraaxial fluid collection is present. Vascular: Atherosclerotic changes are present within the cavernous internal carotid arteries and at the dural margin of both vertebral arteries. There is no hyperdense vessel. Skull: Calvarium is intact. No focal lytic or blastic lesions are present. Sinuses/Orbits: The paranasal sinuses and mastoid air cells are clear. The globes and orbits are within normal limits. IMPRESSION: 1. Stable moderate diffuse atrophy and white matter disease with some part election for the frontal lobes. 2. No acute intracranial abnormality or significant interval change. 3. Atherosclerosis. Electronically Signed   By: San Morelle M.D.   On: 11/11/2018 06:21   Dg Chest Port 1 View  Result Date: 11/11/2018 CLINICAL DATA:  Initial evaluation for acute confusion. EXAM: PORTABLE CHEST 1 VIEW COMPARISON:  Prior radiograph from 10/03/2018 FINDINGS: Cardiac and mediastinal silhouettes are stable in size and contour, and remain within normal limits. Lungs are hypoinflated. No focal infiltrates. No pulmonary edema or definite pleural effusion, although the left costophrenic angle is incompletely visualized. No pneumothorax. No acute osseous finding. Few scattered remotely healed left-sided rib fractures noted. IMPRESSION: No radiographic evidence for active cardiopulmonary disease. Electronically Signed   By: Jeannine Boga M.D.   On: 11/11/2018 02:47   US Abdomen Limited Ruq  Result Date: 11/11/2018 CLINICAL DATA:  Elevated LFTs EXAM:  ULTRASOUND ABDOMEN LIMITED RIGHT UPPER QUADRANT COMPARISON:  CT abdomen 02/02/2018 FINDINGS: Gallbladder: No gallstones or Fiorillo thickening visualized. No sonographic Murphy sign noted by sonographer. Common bile duct: Diameter: 2.7 mm Liver: 2.8 x 1.7 x 3.3 cm heterogeneous hypoechoic mass in the left hepatic lobe unchanged compared with prior CT of the abdomen dated 02/02/2018 with characteristics of a benign hemangioma. Liver parenchyma is normal in echogenicity otherwise. Portal vein is patent on color Doppler imaging with normal direction of blood flow towards the liver. Other: Moderate right hydronephrosis. IMPRESSION: 1. No cholelithiasis or sonographic evidence of acute cholecystitis. 2. Moderate right hydronephrosis. Electronically Signed   By: Kathreen Devoid   On: 11/11/2018 08:57   Scheduled Meds:  insulin aspart  0-9 Units Subcutaneous Q4H   insulin detemir  5 Units Subcutaneous QHS   Continuous Infusions:  acyclovir 700 mg (11/12/18 1500)   ampicillin (OMNIPEN) IV 2 g (11/12/18 1400)   cefTRIAXone (ROCEPHIN)  IV 2 g (11/12/18 1135)   dextrose 75 mL/hr at 11/12/18 1630   vancomycin 750 mg (11/12/18 0644)    LOS: 1 day   Kerney Elbe, DO [name] PAGER is on AMION  If 7PM-7AM, please contact night-coverage [email or web address] Password [facility] 11/12/2018, 6:37 PM

## 2018-11-12 NOTE — Progress Notes (Signed)
Blood glucose 41. Gave 25 ml 50% dextrose. Rechecked after 15 minutes blood glucose is 71. Spoke with Hospitalist and agreed not to give 10 units of Levemir. Will continue to closely monitor.

## 2018-11-12 NOTE — Progress Notes (Signed)
Patient complaining of neuropathy pain to bilateral lower extremities and feet.  Patient had Tramadol at 2031, has nothing else PRN ordered for pain.  [facility] text paged with this information,

## 2018-11-12 NOTE — Progress Notes (Signed)
Pt complaining of pain all over. Text page sent for pain medication.

## 2018-11-12 NOTE — Progress Notes (Signed)
Blood glucose 38. Gave 50 ml of Dextrose 50%. Notified Hospitalist. Rechecked CBG after 15 minutes 126. Will continue to monitor.

## 2018-11-12 NOTE — Progress Notes (Signed)
Blood glucose 36. Gave 25 ml of Dextrose 50%. Rechecked after 15 minutes, glucose 69. Gave additional 25 ml Dextrose 50% per protocol. Rechecked after 15 minutes, glucose 111. Notified Hospitalists, changed dextrose 5%-0.45% NS to Dextrose 10% per order. Will continue to closely monitor.

## 2018-11-13 ENCOUNTER — Inpatient Hospital Stay (HOSPITAL_COMMUNITY): Payer: Medicare HMO

## 2018-11-13 DIAGNOSIS — B370 Candidal stomatitis: Secondary | ICD-10-CM

## 2018-11-13 DIAGNOSIS — D649 Anemia, unspecified: Secondary | ICD-10-CM

## 2018-11-13 LAB — COMPREHENSIVE METABOLIC PANEL
ALT: 18 U/L (ref 0–44)
AST: 21 U/L (ref 15–41)
Albumin: 1.9 g/dL — ABNORMAL LOW (ref 3.5–5.0)
Alkaline Phosphatase: 89 U/L (ref 38–126)
Anion gap: 6 (ref 5–15)
BUN: 12 mg/dL (ref 8–23)
CO2: 25 mmol/L (ref 22–32)
Calcium: 8.3 mg/dL — ABNORMAL LOW (ref 8.9–10.3)
Chloride: 108 mmol/L (ref 98–111)
Creatinine, Ser: 0.9 mg/dL (ref 0.61–1.24)
GFR calc Af Amer: >60 mL/min (ref >60)
GFR calc non Af Amer: >60 mL/min (ref >60)
Glucose, Bld: 115 mg/dL — ABNORMAL HIGH (ref 70–99)
Potassium: 3.3 mmol/L — ABNORMAL LOW (ref 3.5–5.1)
Sodium: 139 mmol/L (ref 135–145)
Total Bilirubin: 0.3 mg/dL (ref 0.3–1.2)
Total Protein: 4.8 g/dL — ABNORMAL LOW (ref 6.5–8.1)

## 2018-11-13 LAB — CBC WITH DIFFERENTIAL/PLATELET
Abs Immature Granulocytes: 0.04 10*3/uL (ref 0.00–0.07)
Basophils Absolute: 0.1 10*3/uL (ref 0.0–0.1)
Basophils Relative: 1 %
Eosinophils Absolute: 0.4 10*3/uL (ref 0.0–0.5)
Eosinophils Relative: 4 %
HCT: 25.1 % — ABNORMAL LOW (ref 39.0–52.0)
Hemoglobin: 8.2 g/dL — ABNORMAL LOW (ref 13.0–17.0)
Immature Granulocytes: 0 %
Lymphocytes Relative: 28 %
Lymphs Abs: 2.9 10*3/uL (ref 0.7–4.0)
MCH: 27.4 pg (ref 26.0–34.0)
MCHC: 32.7 g/dL (ref 30.0–36.0)
MCV: 83.9 fL (ref 80.0–100.0)
Monocytes Absolute: 0.7 10*3/uL (ref 0.1–1.0)
Monocytes Relative: 7 %
Neutro Abs: 6.1 10*3/uL (ref 1.7–7.7)
Neutrophils Relative %: 60 %
Platelets: 268 10*3/uL (ref 150–400)
RBC: 2.99 MIL/uL — ABNORMAL LOW (ref 4.22–5.81)
RDW: 14.6 % (ref 11.5–15.5)
WBC: 10.1 10*3/uL (ref 4.0–10.5)
nRBC: 0 % (ref 0.0–0.2)

## 2018-11-13 LAB — IRON AND TIBC
Iron: 30 ug/dL — ABNORMAL LOW (ref 45–182)
Saturation Ratios: 12 % — ABNORMAL LOW (ref 17.9–39.5)
TIBC: 245 ug/dL — ABNORMAL LOW (ref 250–450)
UIBC: 215 ug/dL

## 2018-11-13 LAB — PHOSPHORUS: Phosphorus: 2.1 mg/dL — ABNORMAL LOW (ref 2.5–4.6)

## 2018-11-13 LAB — FERRITIN: Ferritin: 48 ng/mL (ref 24–336)

## 2018-11-13 LAB — RETICULOCYTES
Immature Retic Fract: 10.5 % (ref 2.3–15.9)
RBC.: 2.99 MIL/uL — ABNORMAL LOW (ref 4.22–5.81)
Retic Count, Absolute: 18.8 10*3/uL — ABNORMAL LOW (ref 19.0–186.0)
Retic Ct Pct: 0.6 % (ref 0.4–3.1)

## 2018-11-13 LAB — GLUCOSE, CAPILLARY
Glucose-Capillary: 110 mg/dL — ABNORMAL HIGH (ref 70–99)
Glucose-Capillary: 127 mg/dL — ABNORMAL HIGH (ref 70–99)
Glucose-Capillary: 142 mg/dL — ABNORMAL HIGH (ref 70–99)
Glucose-Capillary: 153 mg/dL — ABNORMAL HIGH (ref 70–99)
Glucose-Capillary: 192 mg/dL — ABNORMAL HIGH (ref 70–99)
Glucose-Capillary: 301 mg/dL — ABNORMAL HIGH (ref 70–99)
Glucose-Capillary: 317 mg/dL — ABNORMAL HIGH (ref 70–99)
Glucose-Capillary: 413 mg/dL — ABNORMAL HIGH (ref 70–99)

## 2018-11-13 LAB — MAGNESIUM: Magnesium: 1.8 mg/dL (ref 1.7–2.4)

## 2018-11-13 LAB — VITAMIN B12: Vitamin B-12: 439 pg/mL (ref 180–914)

## 2018-11-13 LAB — FOLATE: Folate: 12.3 ng/mL (ref >5.9)

## 2018-11-13 MED ORDER — GABAPENTIN 400 MG PO CAPS
400.0000 mg | ORAL_CAPSULE | Freq: Three times a day (TID) | ORAL | Status: DC
  Administered 2018-11-13 – 2018-11-24 (×34): 400 mg via ORAL
  Filled 2018-11-13 (×34): qty 1

## 2018-11-13 MED ORDER — FLUCONAZOLE 200 MG PO TABS
200.0000 mg | ORAL_TABLET | Freq: Once | ORAL | Status: AC
  Administered 2018-11-13: 16:00:00 200 mg via ORAL
  Filled 2018-11-13: qty 1

## 2018-11-13 MED ORDER — POTASSIUM CHLORIDE CRYS ER 20 MEQ PO TBCR
40.0000 meq | EXTENDED_RELEASE_TABLET | Freq: Once | ORAL | Status: AC
  Administered 2018-11-13: 10:00:00 40 meq via ORAL
  Filled 2018-11-13: qty 2

## 2018-11-13 MED ORDER — ENSURE ENLIVE PO LIQD
237.0000 mL | Freq: Two times a day (BID) | ORAL | Status: DC
  Administered 2018-11-13 – 2018-11-18 (×8): 237 mL via ORAL

## 2018-11-13 MED ORDER — DICLOFENAC SODIUM 1 % TD GEL
2.0000 g | Freq: Four times a day (QID) | TRANSDERMAL | Status: DC
  Administered 2018-11-16 – 2018-11-24 (×29): 2 g via TOPICAL
  Filled 2018-11-13: qty 100

## 2018-11-13 MED ORDER — POTASSIUM PHOSPHATES 15 MMOLE/5ML IV SOLN
20.0000 mmol | Freq: Once | INTRAVENOUS | Status: AC
  Administered 2018-11-13: 20 mmol via INTRAVENOUS
  Filled 2018-11-13: qty 6.67

## 2018-11-13 MED ORDER — ORAL CARE MOUTH RINSE
15.0000 mL | Freq: Two times a day (BID) | OROMUCOSAL | Status: DC
  Administered 2018-11-13 – 2018-11-24 (×19): 15 mL via OROMUCOSAL

## 2018-11-13 MED ORDER — FLUCONAZOLE 100 MG PO TABS
100.0000 mg | ORAL_TABLET | Freq: Every day | ORAL | Status: DC
  Administered 2018-11-14 – 2018-11-20 (×7): 100 mg via ORAL
  Filled 2018-11-13 (×7): qty 1

## 2018-11-13 MED ORDER — ADULT MULTIVITAMIN W/MINERALS CH
1.0000 | ORAL_TABLET | Freq: Every day | ORAL | Status: DC
  Administered 2018-11-13 – 2018-11-24 (×12): 1 via ORAL
  Filled 2018-11-13 (×12): qty 1

## 2018-11-13 MED ORDER — LIDOCAINE 5 % EX PTCH
1.0000 | MEDICATED_PATCH | CUTANEOUS | Status: DC
  Administered 2018-11-14 – 2018-11-23 (×9): 1 via TRANSDERMAL
  Filled 2018-11-13 (×11): qty 1

## 2018-11-13 MED ORDER — NYSTATIN 100000 UNIT/ML MT SUSP
5.0000 mL | Freq: Four times a day (QID) | OROMUCOSAL | Status: DC
  Administered 2018-11-13 – 2018-11-24 (×41): 500000 [IU] via ORAL
  Filled 2018-11-13 (×41): qty 5

## 2018-11-13 NOTE — Evaluation (Signed)
Clinical/Bedside Swallow Evaluation Patient Details  Name: [name] MRN: [id number] Date of Birth: 1956/01/20  Today's Date: 11/13/2018 Time: SLP Start Time (ACUTE ONLY): 1325 SLP Stop Time (ACUTE ONLY): 1355 SLP Time Calculation (min) (ACUTE ONLY): 30 min  Past Medical History:  Past Medical History:  Diagnosis Date  . DEPRESSION   . DIABETES MELLITUS, TYPE I   . DRUG ABUSE    pt should have NO controlled substances rx'ed  . Glaucoma   . HEPATITIS C    chronic  . Heroin overdose ([place]) 10/03/2018  . Heroin use 10/03/2018  . Hypertension 02/18/2011  . 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 Vitiligo    Past Surgical History:  Past Surgical History:  Procedure Laterality Date  . ABDOMINAL AORTOGRAM W/LOWER EXTREMITY N/A 07/25/2016   Procedure: Abdominal Aortogram w/Bilateral Lower Extremity Runoff;  Surgeon: Conrad Graton, MD;  Location: [place] [facility];  Service: Cardiovascular;  Laterality: N/A;  . ABDOMINAL AORTOGRAM W/LOWER EXTREMITY N/A 12/24/2016   Procedure: ABDOMINAL AORTOGRAM W/LOWER EXTREMITY;  Surgeon: Serafina Mitchell, MD;  Location: [place] [facility];  Service: Cardiovascular;  Laterality: N/A;  . AMPUTATION TOE Right 07/26/2016   Procedure: AMPUTATION GREAT TOE;  Surgeon: Serafina Mitchell, MD;  Location: [place];  Service: Vascular;  Laterality: Right;  . BIOPSY  08/25/2018   Procedure: BIOPSY;  Surgeon: Irving Copas., MD;  Location: [place];  Service: Gastroenterology;;  . ESOPHAGOGASTRODUODENOSCOPY N/A 08/25/2018   Procedure: ESOPHAGOGASTRODUODENOSCOPY (EGD);  Surgeon: Irving Copas., MD;  Location: [place];  Service: Gastroenterology;  Laterality: N/A;  . EYE SURGERY     retinal surgery x 2, right eye  . INSERTION OF ILIAC STENT Right 07/26/2016   Procedure: INSERTION OF RIGHT POPLITEAL STENT WITH BALLOON ANGIOPLASTY;  Surgeon: Serafina Mitchell, MD;  Location: [place];  Service: Vascular;  Laterality: Right;  . LOWER EXTREMITY  ANGIOGRAM Right 07/26/2016   Procedure: LOWER EXTREMITY ANGIOGRAM;  Surgeon: Serafina Mitchell, MD;  Location: [place];  Service: Vascular;  Laterality: Right;  . PERIPHERAL VASCULAR BALLOON ANGIOPLASTY Right 07/26/2016   Procedure: PERIPHERAL VASCULAR BALLOON ANGIOPLASTY  RIGHT ANTERIOR TIBEAL ARTERY AND RIGHT SUPERFICIAL FEMORAL ARTERY;  Surgeon: Serafina Mitchell, MD;  Location: [place];  Service: Vascular;  Laterality: Right;  . PERIPHERAL VASCULAR INTERVENTION Right 12/24/2016   Procedure: PERIPHERAL VASCULAR INTERVENTION;  Surgeon: Serafina Mitchell, MD;  Location: [place] [facility];  Service: Cardiovascular;  Laterality: Right;  lower extr   HPI:  63yo male admitted 11/11/2018 with AMS, hyperglycemia. PMH: depression, IDDM, drug abuse (heroin), heroin o/d,  chronic hepC, HTN.  CXR = negative   Assessment / Plan / Recommendation Clinical Impression  Pt known to ST services, having had 3 MBS since October 2018. Most recent MBS was performed in May 2020, with recommendation for dys 3 (mechanical soft) diet with thin liquids via CUP - pt exhibited aspiration of thin liquids via straw. Today, pt presents with adequate oral motor strength and function, poor dentition. CN exam unremarkable. Lingual surface has whiteish areas of discoloration, with the appearance of thrush. RN and MD informed. No obvious oral issues or overt s/s aspiration observed on any consistency tested. Will advance diet to mechanical soft with thin liquids, recommending NO STRAWS per MBS recommendations. Pt has hx GERD, so will also recommend positioning up right for meals and for 30 minutes afterward. Meds mayu be given whole with puree. Safe swallow precautions posted at [facility].  SLP Visit Diagnosis: Dysphagia, unspecified (R13.10)    Aspiration Risk  Mild aspiration risk    Diet Recommendation Dysphagia 3 (Mech soft);Thin liquid   Liquid Administration via: Cup;No straw Medication Administration: Whole meds with  puree Supervision: Patient able to self feed Compensations: Minimize environmental distractions;Slow rate;Small sips/bites;Clear throat intermittently Postural Changes: Seated upright at 90 degrees;Remain upright for at least 30 minutes after po intake    Other  Recommendations Oral Care Recommendations: Oral care BID   Follow up Recommendations None          Prognosis Prognosis for Safe Diet Advancement: Fair      Swallow Study   General Date of Onset: 11/11/18 HPI: 63yo male admitted 11/11/2018 with AMS, hyperglycemia. PMH: depression, IDDM, drug abuse (heroin), heroin o/d,  chronic hepC, HTN.  CXR = negative Type of Study: Bedside Swallow Evaluation Previous Swallow Assessment: MBS 08/28/2018 - rec dys 3/thin via cup (aspiration of thin via straw), esophageal component suspected. 3 MBS completed since October 2019. Diet Prior to this Study: Thin liquids(full liquid) Temperature Spikes Noted: No Respiratory Status: Room air History of Recent Intubation: No Behavior/Cognition: Alert;Cooperative Oral Cavity Assessment: Within Functional Limits Oral Care Completed by SLP: No Oral Cavity - Dentition: Poor condition;Missing dentition Vision: Functional for self-feeding Self-Feeding Abilities: Able to feed self Patient Positioning: Upright in bed Baseline Vocal Quality: Normal Volitional Cough: Strong Volitional Swallow: Able to elicit    Oral/Motor/Sensory Function Overall Oral Motor/Sensory Function: Within functional limits   Ice Chips Ice chips: Not tested   Thin Liquid Thin Liquid: Within functional limits Presentation: Cup    Nectar Thick Nectar Thick Liquid: Not tested   Honey Thick Honey Thick Liquid: Not tested   Puree Puree: Within functional limits Presentation: Spoon   Solid     Solid: Within functional limits Presentation: Fisher B. Quentin Ore, [facility], [place] Speech Language Pathologist [phone]  Shonna Chock 11/13/2018,2:10 PM

## 2018-11-13 NOTE — Progress Notes (Signed)
Initial Nutrition Assessment  DOCUMENTATION CODES:   Not applicable  INTERVENTION:  -Not appropriate for education at this time given AMS  -Ensure Enlive po BID, each supplement provides 350 kcal and 20 grams of protein  -Magic cup TID with meals, each supplement provides 290 kcal and 9 grams of protein  -Advance diet as tolerated; monitor for SLP recs  NUTRITION DIAGNOSIS:   Inadequate oral intake related to lethargy/confusion, acute illness(acute encephalopathy; viral meningitis) as evidenced by estimated needs, other (comment)(378mL po on FL diet).   GOAL:   Patient will meet greater than or equal to 90% of their needs   MONITOR:   PO intake, Labs, I & O's, Supplement acceptance, Weight trends, Diet advancement  REASON FOR ASSESSMENT:   Consult Poor PO, Assessment of nutrition requirement/status, Diet education  ASSESSMENT:  RD working remotely.  63 year old male with medical history significant of depression, IDDM, drug abuse, chronic hep C, HTN, right great toe amputation s/p osteomyelitis, PAD presented to ED via EMS for evaluation of AMS and hyperglycemia. Patient found to be in DKA, with acute encephalopathy, bladder outlet obstruction,subacute rib fractures, hyperkalemia, and unclear etiology of sepsis; possibly viral meningitis  Per chart review pt with slight improvement to mentation, more awake today and combative. DKA improved; gap closed however insulin drip continued and pt became hypoglycemic and started on D10 drip. Upon admission pt thought to have bacterial meningitis. Yesterday, neurology suggesting possible viral meningitis - Pt started on acyclovir.  SLP consulted for swallow evaluation - not completed at this time   Diet advanced to [facility] - RD to order Ensure and Magic Cup to assist with calorie and protein needs  Weights reviewed - stable Current wt - 64.2 kg CBG's 42-217   I/O: + 3095 ml since admit UOP: 1275 ml x 24 hrs  Medications reviewed  and include: Folic acid, gabapentin, SSI, Levemir 5 units at bedtime, Protonix K Phos 20 mmol in D5 500 mL infusion D10 @ 75 mL/hr  Labs: K 3.3 - addressing Phos - 2.1 - addressing WBC - WDL Hgb - 8.2   NUTRITION - FOCUSED PHYSICAL EXAM: Unable to complete at this time   Diet Order:   Diet Order            Diet full liquid Room service appropriate? Yes; Fluid consistency: Thin  Diet effective now              EDUCATION NEEDS:   Not appropriate for education at this time  Skin:  Skin Assessment: Reviewed RN Assessment  Last BM:  PTA  Height:   Ht Readings from Last 1 Encounters:  10/13/18 6' (1.829 m)    Weight:   Wt Readings from Last 1 Encounters:  11/13/18 64.2 kg    Ideal Body Weight:  80.9 kg  BMI:  Body mass index is 19.2 kg/m.  Estimated Nutritional Needs:   Kcal:  4142-3953  Protein:  96-113  Fluid:  >1.9L   Lajuan Lines, RD, LDN  After Hours/Weekend Pager: [phone]

## 2018-11-13 NOTE — Consult Note (Signed)
   [facility] CM Inpatient Consult   11/13/2018  [name] June 06, 1955 [id number]    Patient was screened for possible [facility] Care Management services needs under his [facility] plan with 55% extreme high risk for readmission, 4 hospitalizations and 4 ED visits in the past 6 months.    Per history and physical on 11/11/18 shows as: [name] is a 63 y.o. male with medical history significant of depression, insulin-dependent diabetes mellitus, drug abuse, chronic hep C, hypertension presenting to the hospital via EMS for evaluation of altered mental status and hyperglycemia.   Patient has been followed by Dr. Renato Shin with [facility].   Review of medical record shows that patient is not currently a beneficiary of the attributed [place] in the [employer].   Reason: NO primary care provider listed. (Patient will not be eligible for [facility] Care Management follow-up without a primary care provider).   This patient is currently NOT eligible for [facility] Care Management Services. Membership roster used to verify non-eligible status.     For questions, please call:   Edwena Felty A. Latrise Bowland, BSN, RN-BC [facility] Liaison Cell: [phone]

## 2018-11-13 NOTE — Care Management Important Message (Signed)
Important Message  Patient Details  Name: [name] MRN: [id number] Date of Birth: [birth year]   Medicare Important Message Given:  Yes     [name] 11/13/2018, 12:42 PM

## 2018-11-13 NOTE — Progress Notes (Signed)
FLUCONAZOLE CONSULT NOTE - INITIAL   Pharmacy Consult for Fluconazole Indication: oropharyngeal candidiasis  Allergies  Allergen Reactions  . Codeine Itching    Tolerates hydrocodone/apap   Patient Measurements: Height: 6' (182.9 cm) Weight: 141 lb 8.6 oz (64.2 kg)(no extras on bed (besides normal pillow, sheet & blanket)) IBW/kg (Calculated) : 77.6  Assessment:   63 yr old male to begin Fluconazole for oropharyngeal candidiasis.   Okay for oral therapy per Dr. Alfredia Ferguson.  Goal of Therapy:   eradication of oral thrush  Plan:   Fluconazole 200 mg PO x 1, then 100 mg PO daily.  Treat for 7-14 days, based on improvement.   Labs: Recent Labs    11/11/18 2139 11/12/18 0420 11/13/18 0436  WBC 21.1* 17.2* 10.1  HGB 9.3* 8.7* 8.2*  HCT 28.5* 26.0* 25.1*  PLT 333 320 268  CREATININE 1.35* 1.30* 0.90  MG 2.3 2.1 1.8  PHOS 3.6 2.6 2.1*  ALBUMIN 2.3* 2.0* 1.9*  PROT 5.9* 5.0* 4.8*  AST 22 20 21   ALT 21 19 18   ALKPHOS 117 99 89  BILITOT 0.7 0.3 0.3   Estimated Creatinine Clearance: 77.3 mL/min (by C-G formula based on SCr of 0.9 mg/dL).   Arty Baumgartner, [place] Pager: [phone] or phone: [phone] 11/13/2018,3:23 PM

## 2018-11-13 NOTE — Progress Notes (Signed)
PROGRESS NOTE    [name]  PPI:[id number] DOB: 04/26/1955 DOA: 11/11/2018 PCP: Patient, No Pcp Per   Brief Narrative:  The patient is a 63 year old Caucasian male with a past medical history significant for but not limited to depression, insulin-dependent diabetes mellitus, drug abuse, hepatitis C which is chronic, hypertension, as well as other comorbidities who presented to the emergency room for evaluation for AMS as well as hyperglycemia.  He initially received Ativan prior to obtaining LP and remained somnolent and a history was not obtainable as he remains somnolent and encephalopathic.  Patient is DKA improved and gap closed however insulin drip continued and patient became hypoglycemic.  He recurrently became hypoglycemic and was started on a D10 drip.  Mentation is a little improved per nursing and he is more awake today.  Case was discussed with neurology Dr. Malen Gauze yesterday who recommended discontinuing antibiotics for bacterial meningitis and starting the patient on acyclovir for possible viral meningitis.  However this could be reactive to his DKA and will need to continue monitor.  Patient has no longer been febrile and white blood cell count is trending down.  If white blood cell count trends down even further and patient has remained afebrile we will discontinue bacterial meningitis antibiotics in a.m. and await further HSV PCR prior to discontinuing acyclovir.  Currently remains on a D10 drip and has been placed on a clear liquid diet and SLP will come evaluate the patient.  Renal function is trending down after Foley catheter placement and will obtain a renal ultrasound in the a.m.   11/13/2018: Patient is much more awake and alert today and speech therapy evaluated and recommending mechanical soft thin liquid diet.  He also has some thrush and will be treated for thrush.  Patient seems to have improved significantly from a encephalopathic standpoint and he is awake and alert and  conversive today  Assessment & Plan:   Principal Problem:   DKA (diabetic ketoacidoses) ([place]) Active Problems:   AKI (acute kidney injury) ([place])   AMS (altered mental status)   Sepsis ([place])   Hyperkalemia  DKA, improved  Uncontrolled Diabetes Mellitus Type 1 with labile blood sugar -Likely precipitating factor is infection given fever and signs of sepsis.  -Blood glucose 1401 on admission and has had periods of significant hypoglycemia so started on a D10 drip and insulin drip was stopped.  He was transitioned to 5 units of Levemir and placed on a sensitive NovoLog sliding scale every 4 and will change to before meals and at bedtime when patient is eating more after SLP evaluation -On admissionBicarb 13, anion gap 25.   Now anion gap is 6 and bicarbonate is 25 -UA positive for ketones. Initial VBG with pH 7.27, repeat VBG with pH 7.42. -Insulin per DKA protocol  now stopped -IV fluids with normal saline and D5 half-normal saline has been stopped and patient is now on a D10 drip because of persistent hypoglycemia; now on D10 at 75 mL/hr and will discontinue when patient is consistently taking in p.o. intake -Patient CBG have been ranging from 110-229 -CBG's are improving and Last CBG was 217 -Patient hemoglobin A1c was 12.3 -CBG every 4 hours and more frequently if necessary -Initiate diet now and subcutaneous insulin as bicarb normal and gap closes and he is more awake; have placed the patient on clear liquid diet and increased to a FULL Liquid Diet today but will advance to Mechanical Soft Diet  -We will obtain speech consultation to ensure  the patient can properly swallow; They recommended mechanical soft diet with thin liquids via cup as patient tends to aspirate with a straw  Sepsis, unclear exact etiology, concern for possible meningitis vs. GI infection, Resolved and improved and has no signs of Infection -Temperature 100.6 F on admission has not been febrile since  -Persistently was tachycardic and tachypneic but now. Not hypotensive.  -White count 13.1 with left shift on Admission and trended up to 21,000 but is now 10,100 -Lactic acid 5.8 and repeat trending down to 1.2  -COVID-19 rapid test negative.  -Check MRSA PCR and was Negative -UA not suggestive of infection.  -Chest x-ray without evidence of pneumonia.  -Concern for possible meningitis given fever and altered mental status but no source so will stop ABx -Check CT Abd/Pelvis as patient had a distended and firm Abdomen -Broad-spectrum antibiotic coverage with vancomycin, cefepime, and ampicillin now stopped .  I added acyclovir for possible viral meningitis after discussion with neurology but will stop that as well as HSV-1 and HSV-2 Negaive discontinue if patient has not spiked any more temperatures -IV fluid resuscitation as above  -LP performedin the ED, CSF analysislabs sent; and I discussed the case with Dr. Rory Percy of neurology who feels this could be a viral meningitis and less likely bacterial meningitis however we will continue antibiotics for possible sepsis unclear etiology and will obtain MRSA PCR as well -Blood culture x2 showed no growth at 1 day -Urine culture showed multiple species present -CSF culture showed no organisms seen and no growth in 1 day -Continued to trend lactate and now that is improved we will not trend -Continue to Monitor Closely  and will repeat CBC and monitor temperature curve  Acute Encephalopathy significantly improving -Likely secondary to infection/possible meningitis and DKA but likely is only secondary to DKA as no source of infection has been found currently -Ethanol, acetaminophen, and salicylate levels normal.  -Ammonia normal.  -UDS negative. -Head CT negative for acute finding. -After patient has remained stable with no leukocytosis or fevers we will discontinue antibiotics and antivirals and HSV PCR is still pending but it is B1 and  HSV-2 in the serum is negative so we will stop acyclovir  SignificantHyperkalemia, is now hypokalemic in the setting of D10 drip -Potassium 6.8 on admission. Likely related to metabolic acidosis from DKA and AKI. -Cardiac monitoring -EKG showed Sinus Rhythm   -Calcium gluconate given -Was receiving IV insulin for DKA which should improve hyperkalemia but now is hypokalemic and being replete as K+ was 3.3 -Replete with po KCl and IV K Phos 20 mmol  -Sodium bicarbonate given -Monitor potassium level very closely -Replete as necessary -Repeat CMP in a.m.  AKI in the setting of dehydration and bladder outlet obstruction, improved -BUN 53, creatinine 1.8 on Admission. Baseline creatinine 0.9-1.1.  -Initially thought to be likely prerenal secondary to dehydration in the setting of DKA and sepsis. -C/w IV fluid hydration but changed to D10 drip given persistent hypoglycemia -BUN/Cr went from 50/1.56 and is now 12/ -Continue to monitor and trend renal function -Avoid Nephrotoxic agents and Medications, Contrast Dyes, and Hypotension -Continue to Monitor urine output Closely  -Foley catheter was placed for strict I's and O's and because of acute urine retention  -Will start Tamsulosin -Repeat CMP in a.m.  Hypertensive Urgency -Systolics were in the 017P and have improved; the pressure is now 150/69 -IV hydralazine PRN   Elevated LFTs, improved Hyperbilirubinemia, improved -Alkaline phosphatase 186 on admission and is now 99.  -  T bili was 1.8 and is now 0.3. AST and ALT normal. Abdominal exam benign but he did have a lot of urinary distention yesterday -Right upper quadrant ultrasound showed no cholelithiasis or sonographic evidence of acute cholecystitis there is also moderate right hydronephrosis noted and a 2.8 x 1.7 x 2.3 cm heterogenous hypoechoic mass in the left hepatic lobe is unchanged from 02/02/2018 with characteristics of a benign hemangioma  Hyponatremia and  Pseudohyponatremia likely secondary to hyperglycemia; improved -Patient sodium was 119 on admission is now 144 -Continue with IV fluid hydration as above and D5 half-normal saline has now been stopped and patient is now on a D10 drip because of persistent hypoglycemia -Continue monitor and trend CMP and BMPs.  Acute Urinary Retention -CT of the abdomen pelvis without contrast showed distention of the urinary bladder and mild bilateral hydroureteronephrosis with raising the possibility for bladder outlet obstruction.  The prostate gland did not appear enlarged.  There is also a small 2 mm calculus with the right kidney -Foley catheter was placed -renal function is trending down -Patient has good urine output -We will obtain a renal ultrasound today  -We will also start the patient on tamsulosin 0.4 mg p.o. nightly   Subacute rib fractures of the posterior 10th 11th and 12th ribs on the left -Once patient is more awake we will continue with pain control but for now we will continue with acetaminophen -Started patient on Tramadol and Resumed Gabapentin   Normocytic Anemia -Patient's hemoglobin/hematocrit has been dropping likely was hemoconcentrated on admission -Hgb/hematocrit is now 9.0/22.0 -Continue to monitor for signs and symptoms of bleeding; currently no overt bleeding noted -Checked anemia panel and showed iron level of 30, U IBC of 215, TIBC of 245, saturation ratios of 12%, ferritin level 48, folate level 12.3, vitamin B12 level 439 -Repeat CBC in a.m.  Oral Thrush -Will start Fluconazole with Pharmacy to Dose and Nystatin Swish and Spit 4x Daily  DVT prophylaxis: SCDs Code Status: FULL CODE  Family Communication: No family present at bedside  Disposition Plan: (specify when and where you expect patient to be discharged). Include barriers to DC in this tab.  Consultants:   Discussed the case with Dr. Rory Percy of Neurology   Procedures:  None  Antimicrobials:   Anti-infectives (From admission, onward)   Start     Dose/Rate Route Frequency Ordered Stop   11/11/18 2230  acyclovir (ZOVIRAX) 700 mg in dextrose 5 % 100 mL IVPB  Status:  Discontinued     10 mg/kg  70 kg 114 mL/hr over 60 Minutes Intravenous Every 8 hours 11/11/18 2202 11/13/18 0834   11/11/18 1830  vancomycin (VANCOCIN) IVPB 750 mg/150 ml premix  Status:  Discontinued     750 mg 150 mL/hr over 60 Minutes Intravenous Every 12 hours 11/11/18 0520 11/13/18 0834   11/11/18 1700  cefTRIAXone (ROCEPHIN) 2 g in sodium chloride 0.9 % 100 mL IVPB  Status:  Discontinued     2 g 200 mL/hr over 30 Minutes Intravenous Every 12 hours 11/11/18 0642 11/13/18 0834   11/11/18 0800  ampicillin (OMNIPEN) 2 g in sodium chloride 0.9 % 100 mL IVPB  Status:  Discontinued     2 g 300 mL/hr over 20 Minutes Intravenous Every 4 hours 11/11/18 0629 11/13/18 0834   11/11/18 0630  vancomycin (VANCOCIN) 1,500 mg in sodium chloride 0.9 % 500 mL IVPB     1,500 mg 250 mL/hr over 120 Minutes Intravenous  Once 11/11/18 0520 11/11/18 0840  11/11/18 0515  cefTRIAXone (ROCEPHIN) 2 g in sodium chloride 0.9 % 100 mL IVPB     2 g 200 mL/hr over 30 Minutes Intravenous  Once 11/11/18 0511 11/11/18 0711   11/11/18 0515  vancomycin (VANCOCIN) IVPB 1000 mg/200 mL premix  Status:  Discontinued     1,000 mg 200 mL/hr over 60 Minutes Intravenous  Once 11/11/18 0511 11/11/18 0516     Subjective: Seen and examined at bedside and he was significantly more awake today and had just choked on taking his potassium pill.  Was in tears did not realize that his blood sugar was above 1000 a few days ago.  Complaining of leg pain and back and right leg pain.  No nausea or vomiting.  Complained of some chest discomfort after he had just choked on the potassium pill no other concerns or complaints this time is much more awake and conversive today.   Objective: Vitals:   11/12/18 2349 11/13/18 0349 11/13/18 0639 11/13/18 1132  BP: (!) 131/42  (!) 121/57 (!) 109/50 (!) 147/65  Pulse: 73   65  Resp: 14   16  Temp: 97.7 F (36.5 C) 97.7 F (36.5 C)  97.8 F (36.6 C)  TempSrc: Oral Oral  Oral  SpO2:    98%  Weight:  64.2 kg      Intake/Output Summary (Last 24 hours) at 11/13/2018 1201 Last data filed at 11/13/2018 1140 Gross per 24 hour  Intake 3239.76 ml  Output 1650 ml  Net 1589.76 ml   Filed Weights   11/11/18 0536 11/12/18 0403 11/13/18 0349  Weight: 70 kg 60.7 kg 64.2 kg   Examination: Physical Exam:  Constitutional: Thin Caucasian male who is much more awake and alert and is tearful today Eyes: Lids and conjunctive are normal.  Sclera anicteric ENMT: External ears nose appear normal progress normal Neck: Appears supple no JVD Respiratory: Mildly diminished auscultation bilaterally no appreciable wheezing, rales, rhonchi.  Patient not tachypneic or using any accessory muscles of breathing had unlabored Cardiovascular: Regular rate and rhythm.  No appreciable murmurs, rubs, gallops.  No lower extremity edema noted Abdomen: Soft, nontender, nondistended.  Bowel sounds present GU: Deferred but has a Foley catheter in place Musculoskeletal: No contractures or cyanosis.  No joint deformities in upper lower extremities Skin: Skin is warm and dry no appreciable rashes or lesions on to skin evaluation but is wearing a Mepilex pad on his right side of his buttocks Neurologic: Cranial nerves II through XII grossly intact no appreciable focal deficits and follows command Psychiatric: He is much more awake and alert today.  He is oriented x3.  Has a normal judgment.  Very tearful low  Data Reviewed: I have personally reviewed following labs and imaging studies  CBC: Recent Labs  Lab 11/11/18 0103 11/11/18 0104 11/11/18 0438 11/11/18 2139 11/12/18 0420 11/13/18 0436  WBC 13.1*  --   --  21.1* 17.2* 10.1  NEUTROABS 11.1*  --   --  14.8* 11.6* 6.1  HGB 11.9* 13.6 10.9* 9.3* 8.7* 8.2*  HCT 40.0 40.0 32.0* 28.5* 26.0*  25.1*  MCV 93.2  --   --  83.3 83.3 83.9  PLT 495*  --   --  333 320 027   Basic Metabolic Panel: Recent Labs  Lab 11/11/18 1100 11/11/18 1353 11/11/18 2139 11/12/18 0420 11/13/18 0436  NA 143 146* 146* 144 139  K 3.8 3.8 4.4 3.3* 3.3*  CL 109 113* 114* 114* 108  CO2 22 22 24  23  25  GLUCOSE 317* 109* 75 126* 115*  BUN 47* 46* 39* 30* 12  CREATININE 1.58* 1.49* 1.35* 1.30* 0.90  CALCIUM 9.6 9.4 9.1 8.7* 8.3*  MG  --   --  2.3 2.1 1.8  PHOS  --   --  3.6 2.6 2.1*   GFR: Estimated Creatinine Clearance: 77.3 mL/min (by C-G formula based on SCr of 0.9 mg/dL). Liver Function Tests: Recent Labs  Lab 11/11/18 0103 11/11/18 2139 11/12/18 0420 11/13/18 0436  AST 37 22 20 21   ALT 35 21 19 18   ALKPHOS 186* 117 99 89  BILITOT 1.8* 0.7 0.3 0.3  PROT 7.8 5.9* 5.0* 4.8*  ALBUMIN 3.4* 2.3* 2.0* 1.9*   No results for input(s): LIPASE, AMYLASE in the last 168 hours. Recent Labs  Lab 11/11/18 0120  AMMONIA 23   Coagulation Profile: Recent Labs  Lab 11/11/18 0626  INR 0.9   Cardiac Enzymes: No results for input(s): CKTOTAL, CKMB, CKMBINDEX, TROPONINI in the last 168 hours. BNP (last 3 results) No results for input(s): PROBNP in the last 8760 hours. HbA1C: Recent Labs    11/12/18 0420  HGBA1C 12.3*   CBG: Recent Labs  Lab 11/12/18 2353 11/13/18 0239 11/13/18 0358 11/13/18 0806 11/13/18 1135  GLUCAP 171* 153* 127* 110* 192*   Lipid Profile: No results for input(s): CHOL, HDL, LDLCALC, TRIG, CHOLHDL, LDLDIRECT in the last 72 hours. Thyroid Function Tests: No results for input(s): TSH, T4TOTAL, FREET4, T3FREE, THYROIDAB in the last 72 hours. Anemia Panel: Recent Labs    11/13/18 0436  VITAMINB12 439  FOLATE 12.3  FERRITIN 48  TIBC 245*  IRON 30*  RETICCTPCT 0.6   Sepsis Labs: Recent Labs  Lab 11/11/18 0103 11/11/18 0626 11/11/18 1252 11/11/18 1540  LATICACIDVEN 5.8* 3.6* 2.5* 1.2    Recent Results (from the past 240 hour(s))  SARS Coronavirus 2  (CEPHEID - Performed in [place] hospital lab), Hosp Order     Status: None   Collection Time: 11/11/18  1:18 AM   Specimen: Urine, Clean Catch; Nasopharyngeal  Result Value Ref Range Status   SARS Coronavirus 2 NEGATIVE NEGATIVE Final    Comment: (NOTE) If result is NEGATIVE SARS-CoV-2 target nucleic acids are NOT DETECTED. The SARS-CoV-2 RNA is generally detectable in upper and lower  respiratory specimens during the acute phase of infection. The lowest  concentration of SARS-CoV-2 viral copies this assay can detect is 250  copies / mL. A negative result does not preclude SARS-CoV-2 infection  and should not be used as the sole basis for treatment or other  patient management decisions.  A negative result may occur with  improper specimen collection / handling, submission of specimen other  than nasopharyngeal swab, presence of viral mutation(s) within the  areas targeted by this assay, and inadequate number of viral copies  (<250 copies / mL). A negative result must be combined with clinical  observations, patient history, and epidemiological information. If result is POSITIVE SARS-CoV-2 target nucleic acids are DETECTED. The SARS-CoV-2 RNA is generally detectable in upper and lower  respiratory specimens dur ing the acute phase of infection.  Positive  results are indicative of active infection with SARS-CoV-2.  Clinical  correlation with patient history and other diagnostic information is  necessary to determine patient infection status.  Positive results do  not rule out bacterial infection or co-infection with other viruses. If result is PRESUMPTIVE POSTIVE SARS-CoV-2 nucleic acids MAY BE PRESENT.   A presumptive positive result was obtained on the submitted  specimen  and confirmed on repeat testing.  While 2019 novel coronavirus  (SARS-CoV-2) nucleic acids may be present in the submitted sample  additional confirmatory testing may be necessary for epidemiological  and /  or clinical management purposes  to differentiate between  SARS-CoV-2 and other Sarbecovirus currently known to infect humans.  If clinically indicated additional testing with an alternate test  methodology [phone]) is advised. The SARS-CoV-2 RNA is generally  detectable in upper and lower respiratory sp ecimens during the acute  phase of infection. The expected result is Negative. Fact Sheet for Patients:  StrictlyIdeas.no Fact Sheet for Healthcare Providers: BankingDealers.co.za This test is not yet approved or cleared by the [place] FDA and has been authorized for detection and/or diagnosis of SARS-CoV-2 by FDA under an Emergency Use Authorization (EUA).  This EUA will remain in effect (meaning this test can be used) for the duration of the COVID-19 declaration under Section 564(b)(1) of the Act, 21 U.S.C. section 360bbb-3(b)(1), unless the authorization is terminated or revoked sooner. Performed at [facility], [place] [address], [place], [place] [address]   Urine culture     Status: Abnormal   Collection Time: 11/11/18  1:20 AM   Specimen: Urine, Catheterized  Result Value Ref Range Status   Specimen Description URINE, CATHETERIZED  Final   Special Requests   Final    NONE Performed at [facility], [place] [address], [place], [place] [address]    Culture MULTIPLE SPECIES PRESENT, SUGGEST RECOLLECTION (A)  Final   Report Status 11/12/2018 FINAL  Final  Blood Culture (routine x 2)     Status: None (Preliminary result)   Collection Time: 11/11/18  6:26 AM   Specimen: BLOOD  Result Value Ref Range Status   Specimen Description BLOOD LEFT ARM  Final   Special Requests   Final    BOTTLES DRAWN AEROBIC AND ANAEROBIC Blood Culture adequate volume   Culture   Final    NO GROWTH 1 DAY Performed at [facility], [place] [address], [place], [place] [address]    Report Status PENDING  Incomplete  Blood Culture  (routine x 2)     Status: None (Preliminary result)   Collection Time: 11/11/18  6:26 AM   Specimen: BLOOD  Result Value Ref Range Status   Specimen Description BLOOD LEFT ARM  Final   Special Requests   Final    BOTTLES DRAWN AEROBIC ONLY Blood Culture results may not be optimal due to an excessive volume of blood received in culture bottles   Culture   Final    NO GROWTH 1 DAY Performed at [facility], [place] [address], [place], [place] [address]    Report Status PENDING  Incomplete  CSF culture     Status: None (Preliminary result)   Collection Time: 11/11/18  7:17 AM   Specimen: CSF; Cerebrospinal Fluid  Result Value Ref Range Status   Specimen Description CSF  Final   Special Requests Immunocompromised  Final   Gram Stain   Final    CYTOSPIN SMEAR WBC PRESENT,BOTH PMN AND MONONUCLEAR NO ORGANISMS SEEN    Culture   Final    NO GROWTH 2 DAYS Performed at [facility], [place] [address] [place], [place] [address]    Report Status PENDING  Incomplete  MRSA PCR Screening     Status: None   Collection Time: 11/12/18  9:19 PM   Specimen: Nasopharyngeal  Result Value Ref Range Status  MRSA by PCR NEGATIVE NEGATIVE Final    Comment:        The GeneXpert MRSA Assay (FDA approved for NASAL specimens only), is one component of a comprehensive MRSA colonization surveillance program. It is not intended to diagnose MRSA infection nor to guide or monitor treatment for MRSA infections. Performed at [facility], [place] [address] [place],  [address]     Radiology Studies: No results found. Scheduled Meds: . feeding supplement (ENSURE ENLIVE)  237 mL Oral BID BM  . folic acid  1 mg Oral Daily  . gabapentin  400 mg Oral TID  . insulin aspart  0-9 Units Subcutaneous Q4H  . insulin detemir  5 Units Subcutaneous QHS  . multivitamin with minerals  1 tablet Oral Daily  . pantoprazole  40 mg Oral BID  . tamsulosin  0.4 mg Oral Daily   Continuous Infusions:  . dextrose 75 mL/hr at 11/13/18 0500  . potassium PHOSPHATE IVPB (in mmol) 20 mmol (11/13/18 1043)    LOS: 2 days   Kerney Elbe, DO [name] PAGER is on [place]  If 7PM-7AM, please contact night-coverage [email or web address] Password [email or web address] 11/13/2018, 12:01 PM

## 2018-11-14 ENCOUNTER — Inpatient Hospital Stay (HOSPITAL_COMMUNITY): Payer: Medicare HMO

## 2018-11-14 DIAGNOSIS — R6510 Systemic inflammatory response syndrome (SIRS) of non-infectious origin without acute organ dysfunction: Secondary | ICD-10-CM

## 2018-11-14 LAB — CBC WITH DIFFERENTIAL/PLATELET
Abs Immature Granulocytes: 0.03 10*3/uL (ref 0.00–0.07)
Basophils Absolute: 0.1 10*3/uL (ref 0.0–0.1)
Basophils Relative: 1 %
Eosinophils Absolute: 0.4 10*3/uL (ref 0.0–0.5)
Eosinophils Relative: 5 %
HCT: 27.6 % — ABNORMAL LOW (ref 39.0–52.0)
Hemoglobin: 8.9 g/dL — ABNORMAL LOW (ref 13.0–17.0)
Immature Granulocytes: 0 %
Lymphocytes Relative: 32 %
Lymphs Abs: 2.6 10*3/uL (ref 0.7–4.0)
MCH: 27.1 pg (ref 26.0–34.0)
MCHC: 32.2 g/dL (ref 30.0–36.0)
MCV: 84.1 fL (ref 80.0–100.0)
Monocytes Absolute: 0.7 10*3/uL (ref 0.1–1.0)
Monocytes Relative: 8 %
Neutro Abs: 4.5 10*3/uL (ref 1.7–7.7)
Neutrophils Relative %: 54 %
Platelets: 266 10*3/uL (ref 150–400)
RBC: 3.28 MIL/uL — ABNORMAL LOW (ref 4.22–5.81)
RDW: 14.6 % (ref 11.5–15.5)
WBC: 8.2 10*3/uL (ref 4.0–10.5)
nRBC: 0 % (ref 0.0–0.2)

## 2018-11-14 LAB — GLUCOSE, CAPILLARY
Glucose-Capillary: 102 mg/dL — ABNORMAL HIGH (ref 70–99)
Glucose-Capillary: 114 mg/dL — ABNORMAL HIGH (ref 70–99)
Glucose-Capillary: 241 mg/dL — ABNORMAL HIGH (ref 70–99)
Glucose-Capillary: 273 mg/dL — ABNORMAL HIGH (ref 70–99)
Glucose-Capillary: 280 mg/dL — ABNORMAL HIGH (ref 70–99)
Glucose-Capillary: 347 mg/dL — ABNORMAL HIGH (ref 70–99)

## 2018-11-14 LAB — COMPREHENSIVE METABOLIC PANEL
ALT: 20 U/L (ref 0–44)
AST: 28 U/L (ref 15–41)
Albumin: 1.8 g/dL — ABNORMAL LOW (ref 3.5–5.0)
Alkaline Phosphatase: 110 U/L (ref 38–126)
Anion gap: 4 — ABNORMAL LOW (ref 5–15)
BUN: 11 mg/dL (ref 8–23)
CO2: 27 mmol/L (ref 22–32)
Calcium: 8.5 mg/dL — ABNORMAL LOW (ref 8.9–10.3)
Chloride: 105 mmol/L (ref 98–111)
Creatinine, Ser: 0.92 mg/dL (ref 0.61–1.24)
GFR calc Af Amer: >60 mL/min (ref >60)
GFR calc non Af Amer: >60 mL/min (ref >60)
Glucose, Bld: 116 mg/dL — ABNORMAL HIGH (ref 70–99)
Potassium: 4.5 mmol/L (ref 3.5–5.1)
Sodium: 136 mmol/L (ref 135–145)
Total Bilirubin: 0.1 mg/dL — ABNORMAL LOW (ref 0.3–1.2)
Total Protein: 4.9 g/dL — ABNORMAL LOW (ref 6.5–8.1)

## 2018-11-14 LAB — HSV CULTURE AND TYPING

## 2018-11-14 LAB — CSF CULTURE W GRAM STAIN: Culture: NO GROWTH

## 2018-11-14 LAB — PHOSPHORUS: Phosphorus: 2.5 mg/dL (ref 2.5–4.6)

## 2018-11-14 LAB — MAGNESIUM: Magnesium: 1.8 mg/dL (ref 1.7–2.4)

## 2018-11-14 NOTE — Progress Notes (Signed)
Wounds in posterior head, coccyx, and right knee measured and description was documented.  Noted fluid filled blister on posterior right 4th toe  Idolina Primer, RN

## 2018-11-14 NOTE — Consult Note (Signed)
[facility] Nurse wound consult note Patient receiving care in [place]. Reason for Consult: buttock and knee wounds Wound type: unclear etiology.  I have sent a SecureChat message to Dr. Claybon Jabs asking for photos of the areas. I have placed an order for the primary RN to measure and document dimensions and wound characteristics in the flowsheet. Val Riles, RN, MSN, CWOCN, CNS-BC, pager [phone]

## 2018-11-14 NOTE — Progress Notes (Signed)
PROGRESS NOTE    [name]  VQM:[id number] DOB: 1956-01-16 DOA: 11/11/2018 PCP: Patient, No Pcp Per   Brief Narrative:  The patient is a 63 year old Caucasian male with a past medical history significant for but not limited to depression, insulin-dependent diabetes mellitus, drug abuse, hepatitis C which is chronic, hypertension, as well as other comorbidities who presented to the emergency room for evaluation for AMS as well as hyperglycemia.  He initially received Ativan prior to obtaining LP and remained somnolent and a history was not obtainable as he remains somnolent and encephalopathic.  Patient is DKA improved and gap closed however insulin drip continued and patient became hypoglycemic.  He recurrently became hypoglycemic and was started on a D10 drip.  Mentation is a little improved per nursing and he is more awake today.  Case was discussed with neurology Dr. Malen Gauze yesterday who recommended discontinuing antibiotics for bacterial meningitis and starting the patient on acyclovir for possible viral meningitis.  However this could be reactive to his DKA and will need to continue monitor.  Patient has no longer been febrile and white blood cell count is trending down.  If white blood cell count trends down even further and patient has remained afebrile we will discontinue bacterial meningitis antibiotics in a.m. and await further HSV PCR prior to discontinuing acyclovir.  Currently remains on a D10 drip and has been placed on a clear liquid diet and SLP will come evaluate the patient.  Renal function is trending down after Foley catheter placement and will obtain a renal ultrasound in the a.m.   11/13/2018: Patient is much more awake and alert today and speech therapy evaluated and recommending mechanical soft thin liquid diet.  He also has some thrush and will be treated for thrush.  Patient seems to have improved significantly from a encephalopathic standpoint and he is awake and alert and  conversive today  11/14/2018: Patient is much more awake and alert and back to baseline.  He is complaining of some foot pain as well as buttock pain when I looked at his foot and appears he may have a broken toe and looked at his buttocks on the right side and looks like he has a sacral ulcer.  Wound nurse has been consulted for further evaluation.  Will obtain an x-ray for his foot.  Laboratory work is significantly improved and we will obtain a PT and OT evaluation and get the patient out of bed to chair  Assessment & Plan:   Principal Problem:   DKA (diabetic ketoacidoses) ([place]) Active Problems:   AKI (acute kidney injury) ([place])   AMS (altered mental status)   Sepsis ([place])   Hyperkalemia  DKA, improved  Uncontrolled Diabetes Mellitus Type 1 with labile blood sugar -Likely precipitating factor is infection given fever and signs of sepsis.  -Blood glucose 1401 on admission and has had periods of significant hypoglycemia so started on a D10 drip and insulin drip was stopped.  He was transitioned to 5 units of Levemir and placed on a sensitive NovoLog sliding scale every 4 and will change to before meals and at bedtime when patient is eating more after SLP evaluation -On admissionBicarb 13, anion gap 25.   Now anion gap is 6 and bicarbonate is 25 -UA positive for ketones. Initial VBG with pH 7.27, repeat VBG with pH 7.42. -Insulin per DKA protocol  now stopped -IV fluids have now been stopped especially D10 now the patient is tolerating p.o. -Patient CBG have been ranging from  [phone] -CBG's are improving and Last CBG was 217 -Patient hemoglobin A1c was 12.3 -CBG every 4 hours and more frequently if necessary -Now on a mechanical soft diet with thin liquids via cup as patient tends to aspirate with a straw -D10 drip is now stopped  SIRS 2/2 to DKA with Infection Ruled Out, initially concern for possible meningitis vs. GI infection, Resolved and improved and has no signs of  Infection -Temperature 100.6 F on admission has not been febrile since -Persistently was tachycardic and tachypneic but now. Not hypotensive.  -White count 13.1 with left shift on Admission and trended up to 21,000 but is now 8.2 -Lactic acid 5.8 and repeat trending down to 1.2  -COVID-19 rapid test negative.  -Check MRSA PCR and was Negative -UA not suggestive of infection.  -Chest x-ray without evidence of pneumonia.  -Concern for possible meningitis given fever and altered mental status but no source so will stop ABx -Check CT Abd/Pelvis as patient had a distended and firm Abdomen -Broad-spectrum antibiotic coverage with vancomycin, cefepime, and ampicillin now stopped .  I added acyclovir for possible viral meningitis after discussion with neurology but will stop that as well as HSV-1 and HSV-2 Negaive discontinue if patient has not spiked any more temperatures -IV fluid resuscitation as above  -LP performedin the ED, CSF analysislabs sent; and I discussed the case with Dr. Rory Percy of neurology who feels this could be a viral meningitis and less likely bacterial meningitis; less likely any meningitis the patient is awake and alert and back to baseline -Blood culture x2 showed no growth at 3 day -Urine culture showed multiple species present -CSF culture showed no organisms seen and no growth in 3 days -Continued to trend lactate and now that is improved we will not trend -Continue to Monitor Closely  and will repeat CBC and monitor temperature curve  Acute Encephalopathy significantly improved -Initially thought to be secondary to infection/possible meningitis and DKA but likely is only secondary to DKA as no source of infection has been found currently and antibiotics have been stopped now -Ethanol, acetaminophen, and salicylate levels normal.  -Ammonia normal.  -UDS negative. -Head CT negative for acute finding. -After patient has remained stable with no leukocytosis or  fevers we will discontinue antibiotics and antivirals and HSV PCR is still pending but it is B1 and HSV-2 in the serum is negative so we will stop acyclovir  Hypokalemia -Improved after repletion and went from 3.3 is now 4.5 -Kidney monitor replete as necessary -Repeat CMP in a.m.  AKI in the setting of dehydration and bladder outlet obstruction, improved -BUN 53, creatinine 1.8 on Admission. Baseline creatinine 0.9-1.1.  -Initially thought to be likely prerenal secondary to dehydration in the setting of DKA and sepsis. -C/w IV fluid hydration but changed to D10 drip given persistent hypoglycemia -BUN/Cr went from 50/1.56 and is now  11/0.92 -Continue to monitor and trend renal function -Avoid Nephrotoxic agents and Medications, Contrast Dyes, and Hypotension -Continue to Monitor urine output Closely  -Foley catheter was placed for strict I's and O's and because of acute urine retention  -Will start Tamsulosin -Repeat CMP in a.m.  Hypertensive Urgency -Systolics were in the 694W and have improved; the pressure is now 134/63 -IV hydralazine PRN -Continue to monitor blood pressures per protocol   Elevated LFTs, improved Hyperbilirubinemia, improved -Alkaline phosphatase 186 on admission and is now 99.  -T bili was 1.8 and is now 0.1. AST and ALT normal. Abdominal exam benign but he did  have a lot of urinary distention yesterday -Right upper quadrant ultrasound showed no cholelithiasis or sonographic evidence of acute cholecystitis there is also moderate right hydronephrosis noted and a 2.8 x 1.7 x 2.3 cm heterogenous hypoechoic mass in the left hepatic lobe is unchanged from 02/02/2018 with characteristics of a benign hemangioma -Continue to monitor intermittently and repeat a BMP in the morning as patient's LFTs are normal  Hyponatremia and Pseudohyponatremia likely secondary to hyperglycemia; improved -Patient sodium was 119 on admission is now 136 -IV fluids D10 now stopped  as patient is eating -Continue monitor and trend CMP and BMPs.  Acute Urinary Retention -CT of the abdomen pelvis without contrast showed distention of the urinary bladder and mild bilateral hydroureteronephrosis with raising the possibility for bladder outlet obstruction.  The prostate gland did not appear enlarged.  There is also a small 2 mm calculus with the right kidney -Foley catheter was placed -renal function is trending down -Patient has good urine output -Renal ultrasound done and showed "Minimal bilateral perinephric fluid noted. The exam is otherwise normal." -We will also start the patient on tamsulosin 0.4 mg p.o. nightly -We will need a trial of void prior to discharge   Subacute rib fractures of the posterior 10th 11th and 12th ribs on the left -Once patient is more awake we will continue with pain control but for now we will continue with acetaminophen -Started patient on Tramadol and Resumed Gabapentin  -Continue to monitor pain control  Normocytic Anemia -Patient's hemoglobin/hematocrit has been dropping likely was hemoconcentrated on admission -Hgb/hematocrit is now 8.9/27.6 -Continue to monitor for signs and symptoms of bleeding; currently no overt bleeding noted -Checked anemia panel and showed iron level of 30, U IBC of 215, TIBC of 245, saturation ratios of 12%, ferritin level 48, folate level 12.3, vitamin B12 level 439 -Repeat CBC in a.m.  Oral Thrush -Will start Fluconazole with Pharmacy to Dose and Nystatin Swish and Spit 4x Daily -We will need to treat for at least 7 to 14 days p.o.  Right Foot Pain -Patient had an amputated right first ray as well as what appears to be a broken second toe -We will obtain a right foot x-ray for further evaluation We may need to discuss with orthopedics given his toe  Pressure ulcer and right knee ulcer, Likely present on admission as he was found unresponsive -Sacral ulcer appears to be a stage II to stage III -Had a  Mepilex pad covering his ulcer and removed today to view -We will have [place] nurse evaluate; I am unable to provide the walk nurse pictures of the ulcer itself  DVT prophylaxis: SCDs Code Status: FULL CODE  Family Communication: No family present at bedside  Disposition Plan: (specify when and where you expect patient to be discharged). Include barriers to DC in this tab.  Consultants:   Discussed the case with Dr. Rory Percy of Neurology   Procedures:  None  Antimicrobials:  Anti-infectives (From admission, onward)   Start     Dose/Rate Route Frequency Ordered Stop   11/14/18 1000  fluconazole (DIFLUCAN) tablet 100 mg     100 mg Oral Daily 11/13/18 1520     11/13/18 1600  fluconazole (DIFLUCAN) tablet 200 mg     200 mg Oral  Once 11/13/18 1520 11/13/18 1610   11/11/18 2230  acyclovir (ZOVIRAX) 700 mg in dextrose 5 % 100 mL IVPB  Status:  Discontinued     10 mg/kg  70 kg 114 mL/hr over 60 Minutes  Intravenous Every 8 hours 11/11/18 2202 11/13/18 0834   11/11/18 1830  vancomycin (VANCOCIN) IVPB 750 mg/150 ml premix  Status:  Discontinued     750 mg 150 mL/hr over 60 Minutes Intravenous Every 12 hours 11/11/18 0520 11/13/18 0834   11/11/18 1700  cefTRIAXone (ROCEPHIN) 2 g in sodium chloride 0.9 % 100 mL IVPB  Status:  Discontinued     2 g 200 mL/hr over 30 Minutes Intravenous Every 12 hours 11/11/18 0642 11/13/18 0834   11/11/18 0800  ampicillin (OMNIPEN) 2 g in sodium chloride 0.9 % 100 mL IVPB  Status:  Discontinued     2 g 300 mL/hr over 20 Minutes Intravenous Every 4 hours 11/11/18 0629 11/13/18 0834   11/11/18 0630  vancomycin (VANCOCIN) 1,500 mg in sodium chloride 0.9 % 500 mL IVPB     1,500 mg 250 mL/hr over 120 Minutes Intravenous  Once 11/11/18 0520 11/11/18 0840   11/11/18 0515  cefTRIAXone (ROCEPHIN) 2 g in sodium chloride 0.9 % 100 mL IVPB     2 g 200 mL/hr over 30 Minutes Intravenous  Once 11/11/18 0511 11/11/18 0711   11/11/18 0515  vancomycin (VANCOCIN) IVPB 1000 mg/200  mL premix  Status:  Discontinued     1,000 mg 200 mL/hr over 60 Minutes Intravenous  Once 11/11/18 0511 11/11/18 0516     Subjective: Seen and examined at bedside and he was much more awake and alert.  He is complaining of foot pain as well as right buttock pain.  Denies any nausea or vomiting but states that he got choked on his pills again.  Tolerating soft diet.  No other concerns or complaints at this time and happy that he is improved significantly since coming in as he did not remember getting hospitalized.  Objective: Vitals:   11/13/18 2353 11/14/18 0455 11/14/18 0808 11/14/18 1144  BP: 121/61 (!) 143/67 128/60 134/63  Pulse:   74 74  Resp: 12  16 16   Temp: 98.5 F (36.9 C) 97.9 F (36.6 C) 98.3 F (36.8 C) 98 F (36.7 C)  TempSrc: Oral Oral Oral Oral  SpO2:   97% 95%  Weight:  64.8 kg    Height:        Intake/Output Summary (Last 24 hours) at 11/14/2018 1431 Last data filed at 11/14/2018 1146 Gross per 24 hour  Intake 1937.67 ml  Output 3400 ml  Net -1462.33 ml   Filed Weights   11/12/18 0403 11/13/18 0349 11/14/18 0455  Weight: 60.7 kg 64.2 kg 64.8 kg   Examination: Physical Exam:  Constitutional: Thin Caucasian male currently who is much more awake and in no acute distress but complaining of some pain in his buttocks and right foot Eyes: Lids extract are normal.  Sclera anicteric ENMT: External ears and nose appear normal.  Grossly normal hearing Neck: Appears supple with no JVD Respiratory: Slightly diminished auscultation bilaterally no appreciable wheezing, rales rhonchi.  Patient not tachypneic eating excess muscle breathe Cardiovascular: Regular rate and rhythm.  No appreciable murmurs, rubs, gallops.  No lower extremity edema noted Abdomen: Soft, nontender, nondistended.  Bowel sounds present GU: Deferred Musculoskeletal: Has a right first ray toe amputation and what appears to be a broken second toe.  No contractures or cyanosis been noted Skin: Skin is  warm and dry no appreciable rashes but has a sacral ulcer as well as a right knee ulcer and wound. Neurologic: Cranial nerves II through XII grossly intact no appreciable focal deficits and follows commands. Psychiatric: Patient  is awake, alert, oriented x3.  Has a normal judgment and insight.  Not as anxious today  Data Reviewed: I have personally reviewed following labs and imaging studies  CBC: Recent Labs  Lab 11/11/18 0103  11/11/18 0438 11/11/18 2139 11/12/18 0420 11/13/18 0436 11/14/18 0353  WBC 13.1*  --   --  21.1* 17.2* 10.1 8.2  NEUTROABS 11.1*  --   --  14.8* 11.6* 6.1 4.5  HGB 11.9*   < > 10.9* 9.3* 8.7* 8.2* 8.9*  HCT 40.0   < > 32.0* 28.5* 26.0* 25.1* 27.6*  MCV 93.2  --   --  83.3 83.3 83.9 84.1  PLT 495*  --   --  333 320 268 266   < > = values in this interval not displayed.   Basic Metabolic Panel: Recent Labs  Lab 11/11/18 1353 11/11/18 2139 11/12/18 0420 11/13/18 0436 11/14/18 0353  NA 146* 146* 144 139 136  K 3.8 4.4 3.3* 3.3* 4.5  CL 113* 114* 114* 108 105  CO2 22 24 23 25 27   GLUCOSE 109* 75 126* 115* 116*  BUN 46* 39* 30* 12 11  CREATININE 1.49* 1.35* 1.30* 0.90 0.92  CALCIUM 9.4 9.1 8.7* 8.3* 8.5*  MG  --  2.3 2.1 1.8 1.8  PHOS  --  3.6 2.6 2.1* 2.5   GFR: Estimated Creatinine Clearance: 76.3 mL/min (by C-G formula based on SCr of 0.92 mg/dL). Liver Function Tests: Recent Labs  Lab 11/11/18 0103 11/11/18 2139 11/12/18 0420 11/13/18 0436 11/14/18 0353  AST 37 22 20 21 28   ALT 35 21 19 18 20   ALKPHOS 186* 117 99 89 110  BILITOT 1.8* 0.7 0.3 0.3 0.1*  PROT 7.8 5.9* 5.0* 4.8* 4.9*  ALBUMIN 3.4* 2.3* 2.0* 1.9* 1.8*   No results for input(s): LIPASE, AMYLASE in the last 168 hours. Recent Labs  Lab 11/11/18 0120  AMMONIA 23   Coagulation Profile: Recent Labs  Lab 11/11/18 0626  INR 0.9   Cardiac Enzymes: No results for input(s): CKTOTAL, CKMB, CKMBINDEX, TROPONINI in the last 168 hours. BNP (last 3 results) No results for  input(s): PROBNP in the last 8760 hours. HbA1C: Recent Labs    11/12/18 0420  HGBA1C 12.3*   CBG: Recent Labs  Lab 11/13/18 2006 11/13/18 2351 11/14/18 0453 11/14/18 0808 11/14/18 1140  GLUCAP 317* 142* 114* 241* 273*   Lipid Profile: No results for input(s): CHOL, HDL, LDLCALC, TRIG, CHOLHDL, LDLDIRECT in the last 72 hours. Thyroid Function Tests: No results for input(s): TSH, T4TOTAL, FREET4, T3FREE, THYROIDAB in the last 72 hours. Anemia Panel: Recent Labs    11/13/18 0436  VITAMINB12 439  FOLATE 12.3  FERRITIN 48  TIBC 245*  IRON 30*  RETICCTPCT 0.6   Sepsis Labs: Recent Labs  Lab 11/11/18 0103 11/11/18 0626 11/11/18 1252 11/11/18 1540  LATICACIDVEN 5.8* 3.6* 2.5* 1.2    Recent Results (from the past 240 hour(s))  SARS Coronavirus 2 (CEPHEID - Performed in [facility] hospital lab), Hosp Order     Status: None   Collection Time: 11/11/18  1:18 AM   Specimen: Urine, Clean Catch; Nasopharyngeal  Result Value Ref Range Status   SARS Coronavirus 2 NEGATIVE NEGATIVE Final    Comment: (NOTE) If result is NEGATIVE SARS-CoV-2 target nucleic acids are NOT DETECTED. The SARS-CoV-2 RNA is generally detectable in upper and lower  respiratory specimens during the acute phase of infection. The lowest  concentration of SARS-CoV-2 viral copies this assay can detect is 250  copies / mL. A negative result does not preclude SARS-CoV-2 infection  and should not be used as the sole basis for treatment or other  patient management decisions.  A negative result may occur with  improper specimen collection / handling, submission of specimen other  than nasopharyngeal swab, presence of viral mutation(s) within the  areas targeted by this assay, and inadequate number of viral copies  (<250 copies / mL). A negative result must be combined with clinical  observations, patient history, and epidemiological information. If result is POSITIVE SARS-CoV-2 target nucleic acids are  DETECTED. The SARS-CoV-2 RNA is generally detectable in upper and lower  respiratory specimens dur ing the acute phase of infection.  Positive  results are indicative of active infection with SARS-CoV-2.  Clinical  correlation with patient history and other diagnostic information is  necessary to determine patient infection status.  Positive results do  not rule out bacterial infection or co-infection with other viruses. If result is PRESUMPTIVE POSTIVE SARS-CoV-2 nucleic acids MAY BE PRESENT.   A presumptive positive result was obtained on the submitted specimen  and confirmed on repeat testing.  While 2019 novel coronavirus  (SARS-CoV-2) nucleic acids may be present in the submitted sample  additional confirmatory testing may be necessary for epidemiological  and / or clinical management purposes  to differentiate between  SARS-CoV-2 and other Sarbecovirus currently known to infect humans.  If clinically indicated additional testing with an alternate test  methodology [phone]) is advised. The SARS-CoV-2 RNA is generally  detectable in upper and lower respiratory sp ecimens during the acute  phase of infection. The expected result is Negative. Fact Sheet for Patients:  StrictlyIdeas.no Fact Sheet for Healthcare Providers: BankingDealers.co.za This test is not yet approved or cleared by the [place] FDA and has been authorized for detection and/or diagnosis of SARS-CoV-2 by FDA under an Emergency Use Authorization (EUA).  This EUA will remain in effect (meaning this test can be used) for the duration of the COVID-19 declaration under Section 564(b)(1) of the Act, 21 U.S.C. section 360bbb-3(b)(1), unless the authorization is terminated or revoked sooner. Performed at [facility], [place] [address], [place], [place] [address]   Urine culture     Status: Abnormal   Collection Time: 11/11/18  1:20 AM   Specimen: Urine,  Catheterized  Result Value Ref Range Status   Specimen Description URINE, CATHETERIZED  Final   Special Requests   Final    NONE Performed at [facility], [place] [address] [place], [place] [address]    Culture MULTIPLE SPECIES PRESENT, SUGGEST RECOLLECTION (A)  Final   Report Status 11/12/2018 FINAL  Final  Blood Culture (routine x 2)     Status: None (Preliminary result)   Collection Time: 11/11/18  6:26 AM   Specimen: BLOOD  Result Value Ref Range Status   Specimen Description BLOOD LEFT ARM  Final   Special Requests   Final    BOTTLES DRAWN AEROBIC AND ANAEROBIC Blood Culture adequate volume   Culture   Final    NO GROWTH 3 DAYS Performed at [facility], [address] [place], [place] [address]    Report Status PENDING  Incomplete  Blood Culture (routine x 2)     Status: None (Preliminary result)   Collection Time: 11/11/18  6:26 AM   Specimen: BLOOD  Result Value Ref Range Status   Specimen Description BLOOD LEFT ARM  Final   Special Requests   Final  BOTTLES DRAWN AEROBIC ONLY Blood Culture results may not be optimal due to an excessive volume of blood received in culture bottles   Culture   Final    NO GROWTH 3 DAYS Performed at [facility], [place] [address], [place], [place] [address]    Report Status PENDING  Incomplete  CSF culture     Status: None   Collection Time: 11/11/18  7:17 AM   Specimen: CSF; Cerebrospinal Fluid  Result Value Ref Range Status   Specimen Description CSF  Final   Special Requests Immunocompromised  Final   Gram Stain   Final    CYTOSPIN SMEAR WBC PRESENT,BOTH PMN AND MONONUCLEAR NO ORGANISMS SEEN    Culture   Final    NO GROWTH 3 DAYS Performed at [facility], [place] [address], [place], [place] [address]    Report Status 11/14/2018 FINAL  Final  MRSA PCR Screening     Status: None   Collection Time: 11/12/18  9:19 PM   Specimen: Nasopharyngeal  Result Value Ref Range Status   MRSA by PCR NEGATIVE NEGATIVE  Final    Comment:        The GeneXpert MRSA Assay (FDA approved for NASAL specimens only), is one component of a comprehensive MRSA colonization surveillance program. It is not intended to diagnose MRSA infection nor to guide or monitor treatment for MRSA infections. Performed at [facility], [place] [address], [place], [place] [address]     Radiology Studies: US Renal  Result Date: 11/13/2018 CLINICAL DATA:  Acute kidney injury EXAM: RENAL / URINARY TRACT ULTRASOUND COMPLETE COMPARISON:  CT abdomen pelvis November 11, 2018 FINDINGS: Right Kidney: Renal measurements: 11.4 x 4.8 x 5.9 cm = volume: 168 mL . Echogenicity within normal limits. No mass or hydronephrosis visualized. Left Kidney: Renal measurements: 10.9 x 6.2 x 6 cm = volume: 210 mL. Echogenicity within normal limits. No mass or hydronephrosis visualized. Bladder: Foley catheter is identified. Minimal bilateral perinephric fluid is noted. IMPRESSION: Minimal bilateral perinephric fluid noted. The exam is otherwise normal. Electronically Signed   By: Abelardo Diesel M.D.   On: 11/13/2018 17:25   Scheduled Meds:  diclofenac sodium  2 g Topical QID   feeding supplement (ENSURE ENLIVE)  237 mL Oral BID BM   fluconazole  100 mg Oral Daily   folic acid  1 mg Oral Daily   gabapentin  400 mg Oral TID   insulin aspart  0-9 Units Subcutaneous Q4H   insulin detemir  5 Units Subcutaneous QHS   lidocaine  1 patch Transdermal Q24H   mouth rinse  15 mL Mouth Rinse BID   multivitamin with minerals  1 tablet Oral Daily   nystatin  5 mL Oral QID   pantoprazole  40 mg Oral BID   tamsulosin  0.4 mg Oral Daily   Continuous Infusions:   LOS: 3 days   Kerney Elbe, DO [name] PAGER is on AMION  If 7PM-7AM, please contact night-coverage [email or web address] Password [facility] 11/14/2018, 2:31 PM

## 2018-11-15 ENCOUNTER — Encounter (HOSPITAL_COMMUNITY): Payer: Self-pay | Admitting: *Deleted

## 2018-11-15 ENCOUNTER — Other Ambulatory Visit: Payer: Self-pay

## 2018-11-15 LAB — GLUCOSE, CAPILLARY
Glucose-Capillary: 61 mg/dL — ABNORMAL LOW (ref 70–99)
Glucose-Capillary: 76 mg/dL (ref 70–99)
Glucose-Capillary: 187 mg/dL — ABNORMAL HIGH (ref 70–99)
Glucose-Capillary: 315 mg/dL — ABNORMAL HIGH (ref 70–99)
Glucose-Capillary: 304 mg/dL — ABNORMAL HIGH (ref 70–99)
Glucose-Capillary: 200 mg/dL — ABNORMAL HIGH (ref 70–99)

## 2018-11-15 LAB — CBC WITH DIFFERENTIAL/PLATELET
Abs Immature Granulocytes: 0.05 10*3/uL (ref 0.00–0.07)
Basophils Absolute: 0.1 10*3/uL (ref 0.0–0.1)
Basophils Relative: 1 %
Eosinophils Absolute: 0.4 10*3/uL (ref 0.0–0.5)
Eosinophils Relative: 5 %
HCT: 28.7 % — ABNORMAL LOW (ref 39.0–52.0)
Hemoglobin: 9.2 g/dL — ABNORMAL LOW (ref 13.0–17.0)
Immature Granulocytes: 1 %
Lymphocytes Relative: 34 %
Lymphs Abs: 2.4 10*3/uL (ref 0.7–4.0)
MCH: 27.5 pg (ref 26.0–34.0)
MCHC: 32.1 g/dL (ref 30.0–36.0)
MCV: 85.9 fL (ref 80.0–100.0)
Monocytes Absolute: 0.6 10*3/uL (ref 0.1–1.0)
Monocytes Relative: 9 %
Neutro Abs: 3.7 10*3/uL (ref 1.7–7.7)
Neutrophils Relative %: 50 %
Platelets: 268 10*3/uL (ref 150–400)
RBC: 3.34 MIL/uL — ABNORMAL LOW (ref 4.22–5.81)
RDW: 14.4 % (ref 11.5–15.5)
WBC: 7.2 10*3/uL (ref 4.0–10.5)
nRBC: 0 % (ref 0.0–0.2)

## 2018-11-15 LAB — COMPREHENSIVE METABOLIC PANEL
ALT: 25 U/L (ref 0–44)
AST: 39 U/L (ref 15–41)
Albumin: 2.1 g/dL — ABNORMAL LOW (ref 3.5–5.0)
Alkaline Phosphatase: 126 U/L (ref 38–126)
Anion gap: 8 (ref 5–15)
BUN: 15 mg/dL (ref 8–23)
CO2: 27 mmol/L (ref 22–32)
Calcium: 8.8 mg/dL — ABNORMAL LOW (ref 8.9–10.3)
Chloride: 103 mmol/L (ref 98–111)
Creatinine, Ser: 1.07 mg/dL (ref 0.61–1.24)
GFR calc Af Amer: >60 mL/min (ref >60)
GFR calc non Af Amer: >60 mL/min (ref >60)
Glucose, Bld: 78 mg/dL (ref 70–99)
Potassium: 4.9 mmol/L (ref 3.5–5.1)
Sodium: 138 mmol/L (ref 135–145)
Total Bilirubin: 0.5 mg/dL (ref 0.3–1.2)
Total Protein: 5.4 g/dL — ABNORMAL LOW (ref 6.5–8.1)

## 2018-11-15 LAB — HSV 1/2 AB IGG/IGM CSF

## 2018-11-15 LAB — MAGNESIUM: Magnesium: 2 mg/dL (ref 1.7–2.4)

## 2018-11-15 LAB — PHOSPHORUS: Phosphorus: 2.6 mg/dL (ref 2.5–4.6)

## 2018-11-15 MED ORDER — BELLADONNA ALKALOIDS-OPIUM 16.2-60 MG RE SUPP
1.0000 | Freq: Once | RECTAL | Status: AC
  Administered 2018-11-15: 1 via RECTAL
  Filled 2018-11-15: qty 1

## 2018-11-15 MED ORDER — INSULIN DETEMIR 100 UNIT/ML ~~LOC~~ SOLN
3.0000 [IU] | Freq: Every day | SUBCUTANEOUS | Status: DC
  Administered 2018-11-15 – 2018-11-16 (×2): 3 [IU] via SUBCUTANEOUS
  Filled 2018-11-15 (×3): qty 0.03

## 2018-11-15 MED ORDER — BELLADONNA ALKALOIDS-OPIUM 16.2-60 MG RE SUPP
1.0000 | Freq: Once | RECTAL | Status: AC
  Administered 2018-11-15: 15:00:00 1 via RECTAL
  Filled 2018-11-15: qty 1

## 2018-11-15 MED ORDER — SENNOSIDES-DOCUSATE SODIUM 8.6-50 MG PO TABS
1.0000 | ORAL_TABLET | Freq: Two times a day (BID) | ORAL | Status: DC
  Administered 2018-11-15 – 2018-11-24 (×18): 1 via ORAL
  Filled 2018-11-15 (×18): qty 1

## 2018-11-15 MED ORDER — MUPIROCIN CALCIUM 2 % EX CREA
TOPICAL_CREAM | Freq: Two times a day (BID) | CUTANEOUS | Status: DC
  Administered 2018-11-15 – 2018-11-17 (×4): via TOPICAL
  Administered 2018-11-17: 1 via TOPICAL
  Administered 2018-11-18 – 2018-11-19 (×3): via TOPICAL
  Administered 2018-11-19: 1 via TOPICAL
  Administered 2018-11-20 – 2018-11-23 (×8): via TOPICAL
  Administered 2018-11-24: 1 via TOPICAL
  Filled 2018-11-15: qty 15

## 2018-11-15 MED ORDER — ONDANSETRON HCL 4 MG/2ML IJ SOLN
4.0000 mg | Freq: Four times a day (QID) | INTRAMUSCULAR | Status: DC | PRN
  Administered 2018-11-15 – 2018-11-17 (×4): 4 mg via INTRAVENOUS
  Filled 2018-11-15 (×3): qty 2

## 2018-11-15 MED ORDER — POLYETHYLENE GLYCOL 3350 17 G PO PACK
17.0000 g | PACK | Freq: Two times a day (BID) | ORAL | Status: DC
  Administered 2018-11-15 – 2018-11-21 (×9): 17 g via ORAL
  Filled 2018-11-15 (×15): qty 1
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
## 2018-11-15 NOTE — Progress Notes (Signed)
Hypoglycemic Event  CBG: 61  Treatment: 8 oz juice/soda  Symptoms: None  Follow-up CBG: Time:0422 CBG Result:76  Possible Reasons for Event: medication regimen  Comments/MD notified:per protocol    Brit Carbonell L
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
## 2018-11-15 NOTE — Consult Note (Signed)
[facility] Nurse wound consult note Reason for Consult: Consult requested for several wounds. Right knee with 2X2cm dry scabbed area, removed easily when moistened, revealing pink moist healed scar tissue Right heel with 2X2cm clear fluid filled blister Posterior head with chronic full thickness wound from a previous fall, according to the patient; .5X.5X.2cm yellow moist wound bed, small amt tan drainage, no odor Coccyx with stage 3 pressure injury; 2X2X.3cm, 50% yellow, 50% red, mod amt tan drainage, no odor  Located in close proximity on left buttock is a stage 2 pressure injury; 1X1X.1cm, dark red and moist Pressure Injury POA: Yes Dressing procedure/placement/frequency: Aquacel to absorb drainage and promote healing to coccyx wound; foam dressing over coccyx and buttocks to protect from further injury.  Bactroban to promote moist healing to posterior head, foam dressing to protect.  No topical treatment is indicated for right knee and right heel.   Please re-consult if further assistance is needed.  Thank-you,  Julien Girt MSN, [place], [place], [place], [place]

## 2018-11-15 NOTE — Progress Notes (Signed)
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 Nasopharyngeal  Result Value Ref Range Status   MRSA by PCR NEGATIVE NEGATIVE Final    Comment:        The GeneXpert MRSA Assay (FDA approved for NASAL specimens only), is one component of a comprehensive MRSA colonization surveillance program. It is not intended to diagnose MRSA infection nor to guide or monitor treatment for MRSA infections. Performed at [facility], [place] [address], [place], [place] [address]     Radiology Studies: US Renal  Result Date: 11/13/2018 CLINICAL DATA:  Acute kidney injury EXAM: RENAL / URINARY TRACT ULTRASOUND COMPLETE COMPARISON:  CT abdomen pelvis November 11, 2018 FINDINGS: Right Kidney: Renal measurements: 11.4 x 4.8 x 5.9 cm = volume: 168 mL . Echogenicity within normal limits. No mass or hydronephrosis  visualized. Left Kidney: Renal measurements: 10.9 x 6.2 x 6 cm = volume: 210 mL. Echogenicity within normal limits. No mass or hydronephrosis visualized. Bladder: Foley catheter is identified. Minimal bilateral perinephric fluid is noted. IMPRESSION: Minimal bilateral perinephric fluid noted. The exam is otherwise normal. Electronically Signed   By: Abelardo Diesel M.D.   On: 11/13/2018 17:25   Dg Foot 2 Views Right  Result Date: 11/14/2018 CLINICAL DATA:  Chronic right second and third toe pain. No recent injury. EXAM: RIGHT FOOT - 2 VIEW COMPARISON:  Plain films right foot 08/12/2018. 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 No acute bony or joint abnormality is identified. Small linear metallic foreign body in the plantar soft tissues of the heel is unchanged. Extensive atherosclerosis noted. IMPRESSION: No change compared to the prior exam. 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 Small linear radiopaque foreign body in the soft tissues of the heel. Atherosclerosis. 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 Electronically Signed   By: Inge Rise M.D.   On: 11/14/2018 14:57   Scheduled Meds: . diclofenac sodium  2 g Topical QID  . feeding supplement (ENSURE ENLIVE)  237 mL Oral BID BM  . fluconazole  100 mg Oral Daily  . folic acid  1 mg Oral Daily  . gabapentin  400 mg Oral TID  . insulin aspart  0-9 Units Subcutaneous Q4H  . insulin detemir  3 Units Subcutaneous QHS  . lidocaine  1 patch Transdermal Q24H  . mouth rinse  15 mL Mouth Rinse BID  . multivitamin with minerals  1 tablet Oral Daily  . mupirocin cream   Topical BID  . nystatin  5 mL Oral QID  . pantoprazole  40 mg Oral BID  . polyethylene glycol  17 g Oral BID  . senna-docusate  1 tablet Oral BID  . tamsulosin  0.4 mg Oral Daily    Continuous Infusions:  LOS: 4 days   Kerney Elbe, DO [name] PAGER is on [place]  If 7PM-7AM, please contact night-coverage [email or web address] Password [facility] 11/15/2018, 4:42 PM

## 2018-11-15 NOTE — Progress Notes (Signed)
Inpatient Diabetes Program Recommendations  AACE/ADA: New Consensus Statement on Inpatient Glycemic Control (2015)  Target Ranges:  Prepandial:   less than 140 mg/dL      Peak postprandial:   less than 180 mg/dL (1-2 hours)      Critically ill patients:  140 - 180 mg/dL   Lab Results  Component Value Date   GLUCAP 187 (H) 11/15/2018   HGBA1C 12.3 (H) 11/12/2018    Review of Glycemic Control Results for YAW, ESCOTO (MRN [id number]) as of 11/15/2018 10:05  Ref. Range 11/15/2018 03:51 11/15/2018 04:22 11/15/2018 07:29  Glucose-Capillary Latest Ref Range: 70 - 99 mg/dL 61 (L) 76 187 (H)   Diabetes history: Type 1 DM (requires basal and meal coverage) Outpatient Diabetes medications: Novolog 70/30 15 units QAM, 10 units QPM Current orders for Inpatient glycemic control: Novolog 0-9 units Q4H, Levemir 3 units QHS  Inpatient Diabetes Program Recommendations:    Noted consult, hypoglycemic event this Am of 61 mg/dL and subsequent changes to Levemir. Noticed glucose increases as response to carbohydrate intake via dysphagia diet and supplements. However, inconsistent intake makes covering carbs difficult. Will watch given changes. Also, insulin MUST be given within ONE hour of last CBG. Will plan to see 8/3.   Thanks, [name] Curb, MSN, RNC-OB Diabetes Coordinator [phone] (8a-5p)

## 2018-11-16 ENCOUNTER — Inpatient Hospital Stay (HOSPITAL_COMMUNITY): Payer: Medicare HMO | Admitting: Anesthesiology

## 2018-11-16 ENCOUNTER — Telehealth: Payer: Self-pay

## 2018-11-16 ENCOUNTER — Encounter (HOSPITAL_COMMUNITY)
Admission: EM | Disposition: A | Discharge status: Skilled Nursing Facility | Payer: Self-pay | Source: Home / Self Care | Attending: Internal Medicine

## 2018-11-16 ENCOUNTER — Encounter (HOSPITAL_COMMUNITY): Payer: Self-pay | Admitting: *Deleted

## 2018-11-16 DIAGNOSIS — R1310 Dysphagia, unspecified: Secondary | ICD-10-CM

## 2018-11-16 DIAGNOSIS — R45851 Suicidal ideations: Secondary | ICD-10-CM

## 2018-11-16 DIAGNOSIS — F4325 Adjustment disorder with mixed disturbance of emotions and conduct: Secondary | ICD-10-CM

## 2018-11-16 DIAGNOSIS — K209 Esophagitis, unspecified: Secondary | ICD-10-CM

## 2018-11-16 DIAGNOSIS — Z8719 Personal history of other diseases of the digestive system: Secondary | ICD-10-CM

## 2018-11-16 DIAGNOSIS — F339 Major depressive disorder, recurrent, unspecified: Secondary | ICD-10-CM

## 2018-11-16 DIAGNOSIS — F19188 Other psychoactive substance abuse with other psychoactive substance-induced disorder: Secondary | ICD-10-CM

## 2018-11-16 DIAGNOSIS — Z598 Other problems related to housing and economic circumstances: Secondary | ICD-10-CM

## 2018-11-16 DIAGNOSIS — E1010 Type 1 diabetes mellitus with ketoacidosis without coma: Secondary | ICD-10-CM

## 2018-11-16 HISTORY — PX: BIOPSY: SHX5522

## 2018-11-16 HISTORY — PX: ESOPHAGOGASTRODUODENOSCOPY (EGD) WITH PROPOFOL: SHX5813

## 2018-11-16 LAB — COMPREHENSIVE METABOLIC PANEL
ALT: 36 U/L (ref 0–44)
AST: 55 U/L — ABNORMAL HIGH (ref 15–41)
Albumin: 1.9 g/dL — ABNORMAL LOW (ref 3.5–5.0)
Alkaline Phosphatase: 162 U/L — ABNORMAL HIGH (ref 38–126)
Anion gap: 5 (ref 5–15)
BUN: 26 mg/dL — ABNORMAL HIGH (ref 8–23)
CO2: 30 mmol/L (ref 22–32)
Calcium: 8.5 mg/dL — ABNORMAL LOW (ref 8.9–10.3)
Chloride: 101 mmol/L (ref 98–111)
Creatinine, Ser: 1.23 mg/dL (ref 0.61–1.24)
GFR calc Af Amer: >60 mL/min (ref >60)
GFR calc non Af Amer: >60 mL/min (ref >60)
Glucose, Bld: 112 mg/dL — ABNORMAL HIGH (ref 70–99)
Potassium: 4.5 mmol/L (ref 3.5–5.1)
Sodium: 136 mmol/L (ref 135–145)
Total Bilirubin: 0.3 mg/dL (ref 0.3–1.2)
Total Protein: 5.1 g/dL — ABNORMAL LOW (ref 6.5–8.1)

## 2018-11-16 LAB — GLUCOSE, CAPILLARY
Glucose-Capillary: 105 mg/dL — ABNORMAL HIGH (ref 70–99)
Glucose-Capillary: 178 mg/dL — ABNORMAL HIGH (ref 70–99)
Glucose-Capillary: 218 mg/dL — ABNORMAL HIGH (ref 70–99)
Glucose-Capillary: 269 mg/dL — ABNORMAL HIGH (ref 70–99)
Glucose-Capillary: 299 mg/dL — ABNORMAL HIGH (ref 70–99)
Glucose-Capillary: 419 mg/dL — ABNORMAL HIGH (ref 70–99)

## 2018-11-16 LAB — CULTURE, BLOOD (ROUTINE X 2)
Culture: NO GROWTH
Culture: NO GROWTH
Special Requests: ADEQUATE

## 2018-11-16 LAB — CBC WITH DIFFERENTIAL/PLATELET
Abs Immature Granulocytes: 0.07 10*3/uL (ref 0.00–0.07)
Basophils Absolute: 0.1 10*3/uL (ref 0.0–0.1)
Basophils Relative: 1 %
Eosinophils Absolute: 0.4 10*3/uL (ref 0.0–0.5)
Eosinophils Relative: 5 %
HCT: 27.8 % — ABNORMAL LOW (ref 39.0–52.0)
Hemoglobin: 8.9 g/dL — ABNORMAL LOW (ref 13.0–17.0)
Immature Granulocytes: 1 %
Lymphocytes Relative: 37 %
Lymphs Abs: 3 10*3/uL (ref 0.7–4.0)
MCH: 27.5 pg (ref 26.0–34.0)
MCHC: 32 g/dL (ref 30.0–36.0)
MCV: 85.8 fL (ref 80.0–100.0)
Monocytes Absolute: 0.7 10*3/uL (ref 0.1–1.0)
Monocytes Relative: 9 %
Neutro Abs: 3.8 10*3/uL (ref 1.7–7.7)
Neutrophils Relative %: 47 %
Platelets: 283 10*3/uL (ref 150–400)
RBC: 3.24 MIL/uL — ABNORMAL LOW (ref 4.22–5.81)
RDW: 14.6 % (ref 11.5–15.5)
WBC: 8 10*3/uL (ref 4.0–10.5)
nRBC: 0 % (ref 0.0–0.2)

## 2018-11-16 LAB — MAGNESIUM: Magnesium: 2 mg/dL (ref 1.7–2.4)

## 2018-11-16 LAB — PHOSPHORUS: Phosphorus: 2.6 mg/dL (ref 2.5–4.6)

## 2018-11-16 SURGERY — ESOPHAGOGASTRODUODENOSCOPY (EGD) WITH PROPOFOL
Anesthesia: Monitor Anesthesia Care

## 2018-11-16 MED ORDER — SODIUM CHLORIDE 0.9 % IV SOLN
INTRAVENOUS | Status: DC
  Administered 2018-11-16 – 2018-11-19 (×6): via INTRAVENOUS

## 2018-11-16 MED ORDER — PROPOFOL 10 MG/ML IV BOLUS
INTRAVENOUS | Status: DC | PRN
  Administered 2018-11-16: 20 mg via INTRAVENOUS

## 2018-11-16 MED ORDER — INSULIN ASPART 100 UNIT/ML ~~LOC~~ SOLN
5.0000 [IU] | Freq: Once | SUBCUTANEOUS | Status: AC
  Administered 2018-11-16: 5 [IU] via SUBCUTANEOUS

## 2018-11-16 MED ORDER — INSULIN ASPART 100 UNIT/ML ~~LOC~~ SOLN
7.0000 [IU] | Freq: Once | SUBCUTANEOUS | Status: AC
  Administered 2018-11-16: 23:00:00 7 [IU] via SUBCUTANEOUS

## 2018-11-16 MED ORDER — BELLADONNA ALKALOIDS-OPIUM 16.2-60 MG RE SUPP
1.0000 | Freq: Three times a day (TID) | RECTAL | Status: DC | PRN
  Administered 2018-11-16 – 2018-11-24 (×17): 1 via RECTAL
  Filled 2018-11-16 (×20): qty 1

## 2018-11-16 MED ORDER — LACTATED RINGERS IV SOLN
INTRAVENOUS | Status: DC | PRN
  Administered 2018-11-16: 14:00:00 via INTRAVENOUS

## 2018-11-16 MED ORDER — PROPOFOL 500 MG/50ML IV EMUL
INTRAVENOUS | Status: DC | PRN
  Administered 2018-11-16: 100 ug/kg/min via INTRAVENOUS

## 2018-11-16 MED ORDER — MIRTAZAPINE 7.5 MG PO TABS
15.0000 mg | ORAL_TABLET | Freq: Every day | ORAL | Status: DC
  Administered 2018-11-16 – 2018-11-23 (×8): 15 mg via ORAL
  Filled 2018-11-16 (×8): qty 2

## 2018-11-16 SURGICAL SUPPLY — 15 items
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
## 2018-11-16 NOTE — Plan of Care (Signed)
  Problem: Activity: Goal: Risk for activity intolerance will decrease Outcome: Not Progressing   Problem: Nutrition: Goal: Adequate nutrition will be maintained Outcome: Progressing   Problem: Coping: Goal: Level of anxiety will decrease Outcome: Progressing   Problem: Elimination: Goal: Will not experience complications related to bowel motility Outcome: Progressing Goal: Will not experience complications related to urinary retention Outcome: Not Progressing  Pt failed bladder study so foley replaced yesterday 11/15/18.   Problem: Pain Managment: Goal: General experience of comfort will improve Outcome: Not Progressing  Pt is still in steady pain & requesting pain meds about every 4 hour or so.  Problem: Safety: Goal: Ability to remain free from injury will improve Outcome: Progressing   Problem: Skin Integrity: Goal: Risk for impaired skin integrity will decrease Outcome: Progressing

## 2018-11-16 NOTE — Telephone Encounter (Signed)
-----   Message from Jerene Bears, MD sent at 11/16/2018  3:07 PM EDT ----- [name] patient.  Needs follow-up and will need repeat EGD for esophageal dilation in 2-4 weeks. Path pending from EGD today.

## 2018-11-16 NOTE — Telephone Encounter (Signed)
The pt has already been scheduled for 8/13.  He has been advised of the date and time

## 2018-11-16 NOTE — Progress Notes (Signed)
CRITICAL VALUE ALERT  Critical Value:  CBG 419  Date & Time Notied:  2105 11/16/2018  Provider Notified: On call MD notified, advise patient was NPO previously and had a big dinner.  Orders Received/Actions taken: pending

## 2018-11-16 NOTE — Consult Note (Signed)
[facility] Consult: 10:53 AM 11/16/2018  LOS: 5 days    Referring Provider: Dr Alfredia Ferguson  Primary Care Physician:  Patient, No Pcp Per Primary Gastroenterologist:  Dr. Rush Landmark.      Reason for Consultation:  Dysphagia.   HPI: [name] is a 63 y.o. male.  Hx Hepatitis C.  S/p Harvoni, undetectable virus on fup test.    Substance, ETOH abuse. DM 1.  DKA. PVD.  Medical non-compliance.  Non STEMI 01/2018.  OSA.  Liver hemangiomas dating to 2004. Issues with homelessness. CKD.   Brain atrophy, white matter disease per CT of 11/11/2018  Admission in May 2020 with GI bleed.  Normocytic anemia.  DKA.  AKI. 08/25/2018 EGD.  Dr. Rush Landmark, for, melena, FOBT positive ulcerative esophagitis with black esophagus and portions.  Distal esophageal ulcer versus healing Mallory-Weiss tear within the area of esophagitis.  Nonbleeding, clean-based gastric ulcers.  Friable, discolored, inflamed mucosa in the gastric body, question ischemic changes, erosive gastropathy.  Duodenal erythema. Esophageal pathology showed severe acute esophagitis with yeast consistent with candidal esophagitis.  Gastric biopsies showed nonspecific reactive gastropathy. Diflucan added for esophageal candidiasis.  After discharge patient has no showed for the of his scheduled appointments.  Staff had difficulty reaching the patient but he missed at least a couple of scheduled visits with primary care providers in recent weeks.  He was living at a motel at one point having had family disagreement.  He is now back living with the same family which consists of at least 3 adults, 4 children, 1 of the children is mentally retarded, 4 dogs.  Since discharge using the ED as his primary care provider.  Evaluated for hypoglycemia on 6/6 and 6/10.  10/03/18  -10/11/2018 after heroin overdose.  Blood glucose 36.  Issues also included acute metabolic encephalopathy, LLL pneumonia (completed 1 week of Zosyn >> Augmentin), AKI Phone visit with Dr. Jerilynn Mages on 6/30.  Had not been taking any of his prescribed oral medications including PPI and antifungal.  Complaints consisted of solid food dysphagia, liquid dysphagia if he swallowed quickly, some regurgitation.  + This.  Difficulty getting his insulin.  Persistent loose but not black or bloody stools, these predate his admission in May. Dr. Jerilynn Mages sent prescriptions for pantoprazole to a pharmacy and recommended the patient start [place].  Did fill the prescription for the Protonix and has been taking this twice daily for a few days At present he has an appointment set with Dr. Jerilynn Mages for 8/13.    Readmitted 7/29 with AMS, hypertension, tachypnea, blood glucose 1401.  We are asked to see him regarding the dysphagia which has been a problem for many weeks.  He is able to tolerate liquids and can take pills if they are crushed in applesauce.  If he eats anything solid, he regurgitates up foamy white material and sometimes the food he is tried to swallow.  Has not seen any blood.  Additional review of systems pertinent for patient ambulation problems.  He can hardly walk even with a walker assist.  This is because  of neuropathy in his legs.  Past Medical History:  Diagnosis Date  . DEPRESSION   . DIABETES MELLITUS, TYPE I   . DRUG ABUSE    pt should have NO controlled substances rx'ed  . Glaucoma   . HEPATITIS C    chronic  . Heroin overdose ([place]) 10/03/2018  . Heroin use 10/03/2018  . Hypertension 02/18/2011  . 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 Vitiligo     Past Surgical History:  Procedure Laterality Date  . ABDOMINAL AORTOGRAM W/LOWER EXTREMITY N/A 07/25/2016   Procedure: Abdominal Aortogram w/Bilateral Lower Extremity Runoff;  Surgeon: Conrad Villa del [place], MD;  Location: [facility];  Service:  Cardiovascular;  Laterality: N/A;  . ABDOMINAL AORTOGRAM W/LOWER EXTREMITY N/A 12/24/2016   Procedure: ABDOMINAL AORTOGRAM W/LOWER EXTREMITY;  Surgeon: Serafina Mitchell, MD;  Location: [facility];  Service: Cardiovascular;  Laterality: N/A;  . AMPUTATION TOE Right 07/26/2016   Procedure: AMPUTATION GREAT TOE;  Surgeon: Serafina Mitchell, MD;  Location: [facility];  Service: Vascular;  Laterality: Right;  . BIOPSY  08/25/2018   Procedure: BIOPSY;  Surgeon: Irving Copas., MD;  Location: [facility];  Service: Gastroenterology;;  . ESOPHAGOGASTRODUODENOSCOPY N/A 08/25/2018   Procedure: ESOPHAGOGASTRODUODENOSCOPY (EGD);  Surgeon: Irving Copas., MD;  Location: [facility];  Service: Gastroenterology;  Laterality: N/A;  . EYE SURGERY     retinal surgery x 2, right eye  . INSERTION OF ILIAC STENT Right 07/26/2016   Procedure: INSERTION OF RIGHT POPLITEAL STENT WITH BALLOON ANGIOPLASTY;  Surgeon: Serafina Mitchell, MD;  Location: [facility];  Service: Vascular;  Laterality: Right;  . LOWER EXTREMITY ANGIOGRAM Right 07/26/2016   Procedure: LOWER EXTREMITY ANGIOGRAM;  Surgeon: Serafina Mitchell, MD;  Location: [facility];  Service: Vascular;  Laterality: Right;  . PERIPHERAL VASCULAR BALLOON ANGIOPLASTY Right 07/26/2016   Procedure: PERIPHERAL VASCULAR BALLOON ANGIOPLASTY  RIGHT ANTERIOR TIBEAL ARTERY AND RIGHT SUPERFICIAL FEMORAL ARTERY;  Surgeon: Serafina Mitchell, MD;  Location: [facility];  Service: Vascular;  Laterality: Right;  . PERIPHERAL VASCULAR INTERVENTION Right 12/24/2016   Procedure: PERIPHERAL VASCULAR INTERVENTION;  Surgeon: Serafina Mitchell, MD;  Location: [facility];  Service: Cardiovascular;  Laterality: Right;  lower extr    Prior to Admission medications   Medication Sig Start Date End Date Taking? Authorizing Provider  insulin aspart protamine - aspart (NOVOLOG 70/30 MIX) (70-30) 100 UNIT/ML FlexPen Inject 18 units every morning and 18 units every evening. Patient taking  differently: Inject 10-15 Units into the skin 2 (two) times daily with a meal. Inject 15 units every morning and 10 units every evening. 10/11/18  Yes Annita Brod, MD  Accu-Chek Softclix Lancets lancets Use to monitor glucose levels BID; E10.29 08/19/18   Renato Shin, MD  amLODipine (NORVASC) 10 MG tablet Take 1 tablet (10 mg total) by mouth daily for 30 days. 08/15/18 11/11/18  Cristal Ford, DO  bacitracin ointment Apply topically 2 (two) times daily. Apply 1/8th of an inch topically to scalp wound twice a day after washing wound with soap & water.  Leave open to air. Patient not taking: Reported on 11/11/2018 10/11/18   Annita Brod, MD  Blood Glucose Calibration (ACCU-CHEK AVIVA) SOLN 1 each by In Vitro route as needed. Use to calibrate meter prn 08/19/18   Renato Shin, MD  blood glucose meter kit and supplies KIT Dispense based on patient and insurance preference. Use up to four times daily as directed. (FOR ICD-9 250.00, 250.01). 08/15/18  Mikhail, Velta Addison, DO  Blood Glucose Monitoring Suppl (ACCU-CHEK AVIVA PLUS) w/Device KIT 1 each by Does not apply route 2 (two) times a day. Use to monitor glucose levels BID; E10.29 08/19/18   Renato Shin, MD  folic acid (FOLVITE) 1 MG tablet Take 1 tablet (1 mg total) by mouth daily. 09/11/18   Barton Dubois, MD  gabapentin (NEURONTIN) 400 MG capsule Take 400 mg by mouth 3 (three) times daily.    [provider]  glucose blood (ACCU-CHEK AVIVA PLUS) test strip Use to monitor glucose levels BID; E10.29 08/19/18   Renato Shin, MD  insulin aspart (NOVOLOG) 100 UNIT/ML injection < 120: No insulin 120-150: 1 unit, 150-200: 2 units, 200-250: 3 units 250-300: 4 units, 300-350: 5 units, 350-400: 7 units Patient not taking: Reported on 11/11/2018 10/11/18   Annita Brod, MD  Insulin Pen Needle 31G X 5 MM MISC Use with novolog flex pen 08/15/18   Cristal Ford, DO  Isopropyl Alcohol Wipes 70 % MISC Apply 1 each topically daily as needed. 02/27/18    Medina-Vargas, Monina C, NP  pantoprazole (PROTONIX) 40 MG tablet Take 1 tablet (40 mg total) by mouth 2 (two) times daily. 10/13/18   Mansouraty, Telford Nab., MD  pregabalin (LYRICA) 50 MG capsule Take 1 capsule (50 mg total) by mouth 2 (two) times daily. Patient not taking: Reported on 11/11/2018 09/10/18   Barton Dubois, MD  risperiDONE (RISPERDAL) 0.5 MG tablet Take 0.5 tablets (0.25 mg total) by mouth 2 (two) times daily. Patient not taking: Reported on 11/11/2018 09/10/18   Barton Dubois, MD  tamsulosin (FLOMAX) 0.4 MG CAPS capsule Take 1 capsule (0.4 mg total) by mouth daily after supper. Patient not taking: Reported on 11/11/2018 10/11/18   Annita Brod, MD    Scheduled Meds: . diclofenac sodium  2 g Topical QID  . feeding supplement (ENSURE ENLIVE)  237 mL Oral BID BM  . fluconazole  100 mg Oral Daily  . folic acid  1 mg Oral Daily  . gabapentin  400 mg Oral TID  . insulin aspart  0-9 Units Subcutaneous Q4H  . insulin detemir  3 Units Subcutaneous QHS  . lidocaine  1 patch Transdermal Q24H  . mouth rinse  15 mL Mouth Rinse BID  . multivitamin with minerals  1 tablet Oral Daily  . mupirocin cream   Topical BID  . nystatin  5 mL Oral QID  . pantoprazole  40 mg Oral BID  . polyethylene glycol  17 g Oral BID  . senna-docusate  1 tablet Oral BID  . tamsulosin  0.4 mg Oral Daily   Infusions: . sodium chloride 75 mL/hr at 11/16/18 0920   PRN Meds: hydrALAZINE, ondansetron (ZOFRAN) IV, traMADol   Allergies as of 11/11/2018 - Review Complete 10/13/2018  Allergen Reaction Noted  . Codeine Itching     Family History  Problem Relation Age of Onset  . Diabetes Father   . Kidney disease Father   . Arthritis Mother   . Heart disease Mother        CAD  . Colon cancer Neg Hx   . Esophageal cancer Neg Hx   . Inflammatory bowel disease Neg Hx   . Liver disease Neg Hx   . Pancreatic cancer Neg Hx   . Rectal cancer Neg Hx   . Stomach cancer Neg Hx     Social History    Socioeconomic History  . Marital status: Single    Spouse name: Not on file  .  Number of children: 2  . Years of education: 40  . Highest education level: Associate degree: occupational, Hotel manager, or vocational program  Occupational History    Employer: UNEMPLOYED    Comment: disabled  Social Needs  . Financial resource strain: Very hard  . Food insecurity    Worry: Often true    Inability: Often true  . Transportation needs    Medical: Yes    Non-medical: Yes  Tobacco Use  . Smoking status: Current Every Day Smoker    Packs/day: 1.00    Years: 51.00    Pack years: 51.00    Types: Cigarettes  . Smokeless tobacco: Former Systems developer    Types: Chew    Quit date: 1985  Substance and Sexual Activity  . Alcohol use: No    Alcohol/week: 0.0 standard drinks    Comment: 08/14/14 none  . Drug use: Yes    Types: Marijuana, Heroin, Cocaine, "Crack" cocaine, Fentanyl, Methylphenidate    Comment: s/p rehab x 6, occasional drug use (per pt last  use was 06/2015)  . Sexual activity: Not Currently  Lifestyle  . Physical activity    Days per week: 0 days    Minutes per session: 0 min  . Stress: Very much  Relationships  . Social Herbalist on phone: Never    Gets together: Once a week    Attends religious service: Never    Active member of club or organization: No    Attends meetings of clubs or organizations: Not on file    Relationship status: Never married  . Intimate partner violence    Fear of current or ex partner: No    Emotionally abused: No    Physically abused: No    Forced sexual activity: No  Other Topics Concern  . Not on file  Social History Narrative   Lives in boarding house/homeless   Ongoing occ drug use-crack   Single, disabled, prev mental health drug counselor.     REVIEW OF SYSTEMS: Constitutional: Weakness, fatigue ENT:  No nose bleeds Pulm: No shortness of breath CV:  No palpitations, no LE edema.  GU:  No hematuria, no frequency GI: See HPI.   Stools are brown. Heme: Denies unusual or excessive bleeding or bruising. Transfusions: None Neuro:  No headaches, no peripheral tingling or numbness.  Seizure, no syncope. Derm:  No itching, no rash or sores.  Endocrine:  No sweats or chills.  No polyuria or dysuria Immunization: Reviewed Travel:  None beyond [place]: Vital signs in last 24 hours: Vitals:   11/16/18 0800 11/16/18 0904  BP: (!) 117/47 (!) 137/55  Pulse:    Resp: 18 16  Temp:    SpO2:     Wt Readings from Last 3 Encounters:  11/15/18 63 kg  10/13/18 61.7 kg  10/04/18 61.8 kg    General: Chronically ill looking WM Head: No facial asymmetry or swelling.  No signs of head trauma. Eyes: No scleral icterus.  No conjunctival pallor.  EOMI. Ears: Not HOH Nose: No discharge. Mouth: Oropharynx moist, pink, clear.  Tongue midline.  poor dentition. Neck: No JVD, no masses, no thyromegaly. Lungs: Diminished breath sounds but clear bilaterally.  No labored breathing or cough Heart: RRR.  No MRG.  S1, S2 present. Abdomen: Soft.  Not tender or distended.  No HSM, masses, bruits, hernias..   Rectal: DRE deferred.  There is a pressure dressing overlying a sacral ulcer, not removed for exam. Musc/Skeltl: No  joint erythema or swelling. Extremities: Somewhat thin limbs, no edema. Neurologic: Pressured speech.  Oriented x3.  Moves all 4 limbs, strength not tested.  No tremor or asterixis. Skin: vitiligo   Psych: Somewhat anxious, cooperative.  Intake/Output from previous day: 08/02 0701 - 08/03 0700 In: 1420 [P.O.:1420] Out: 2800 [Urine:2800] Intake/Output this shift: Total I/O In: -  Out: 500 [Urine:500]  LAB RESULTS: Recent Labs    11/14/18 0353 11/15/18 0422 11/16/18 0414  WBC 8.2 7.2 8.0  HGB 8.9* 9.2* 8.9*  HCT 27.6* 28.7* 27.8*  PLT 266 268 283   BMET Lab Results  Component Value Date   NA 136 11/16/2018   NA 138 11/15/2018   NA 136 11/14/2018   K 4.5 11/16/2018   K 4.9 11/15/2018    K 4.5 11/14/2018   CL 101 11/16/2018   CL 103 11/15/2018   CL 105 11/14/2018   CO2 30 11/16/2018   CO2 27 11/15/2018   CO2 27 11/14/2018   GLUCOSE 112 (H) 11/16/2018   GLUCOSE 78 11/15/2018   GLUCOSE 116 (H) 11/14/2018   BUN 26 (H) 11/16/2018   BUN 15 11/15/2018   BUN 11 11/14/2018   CREATININE 1.23 11/16/2018   CREATININE 1.07 11/15/2018   CREATININE 0.92 11/14/2018   CALCIUM 8.5 (L) 11/16/2018   CALCIUM 8.8 (L) 11/15/2018   CALCIUM 8.5 (L) 11/14/2018   LFT Recent Labs    11/14/18 0353 11/15/18 0422 11/16/18 0414  PROT 4.9* 5.4* 5.1*  ALBUMIN 1.8* 2.1* 1.9*  AST 28 39 55*  ALT 20 25 36  ALKPHOS 110 126 162*  BILITOT 0.1* 0.5 0.3   PT/INR Lab Results  Component Value Date   INR 0.9 11/11/2018   INR 1.1 08/26/2018   INR 1.0 08/23/2018   Hepatitis Panel No results for input(s): HEPBSAG, HCVAB, HEPAIGM, HEPBIGM in the last 72 hours. C-Diff No components found for: CDIFF Lipase     Component Value Date/Time   LIPASE 17 08/23/2018 1100    Drugs of Abuse     Component Value Date/Time   LABOPIA NONE DETECTED 11/11/2018 0120   COCAINSCRNUR NONE DETECTED 11/11/2018 0120   COCAINSCRNUR POS (A) 05/05/2009 2018   LABBENZ NONE DETECTED 11/11/2018 0120   LABBENZ NEG 05/05/2009 2018   AMPHETMU NONE DETECTED 11/11/2018 0120   THCU NONE DETECTED 11/11/2018 0120   LABBARB NONE DETECTED 11/11/2018 0120     RADIOLOGY STUDIES: Dg Foot 2 Views Right  Result Date: 11/14/2018 CLINICAL DATA:  Chronic right second and third toe pain. No recent injury. EXAM: RIGHT FOOT - 2 VIEW COMPARISON:  Plain films right foot 08/12/2018. 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 No acute bony or joint abnormality is identified. Small linear metallic foreign body in the plantar soft tissues of the heel is unchanged. Extensive atherosclerosis noted.  IMPRESSION: No change compared to the prior exam. 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 Small linear radiopaque foreign body in the soft tissues of the heel. Atherosclerosis. 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 Electronically Signed   By: Inge Rise M.D.   On: 11/14/2018 14:57     IMPRESSION:   *   Dysphagia in patient with severe ulcerative, Candida and likely reflux related esophagitis on EGD early 08/2018.  Noncompliant with PPI and antifungal so likely that  he has the same findings now as then.. ? Esophageal stricture.    *     Poorly controlled IDDM.  Recent admission for hypoglycemia.  Current admission for hyperglycemia.  *      Hx hepatitis C, treated.  Undetectable virus as of 04/29/2017.  CT scan of 01/2018, 11/11/18 shows stable hemangioma in the left lobe of liver but no fatty or cirrhotic changes.   *   ? BOO bilateral hydroureteronephrosis and urinary bladder distention.  *     Substance abuse issues.  Recent heroin overdose  PLAN:     *    EGD set up for this afternoon.  Possible esophageal dilatation depending on findings.  If he has severe esophagitis, Dr. Elmo Putt will likely elect not to pursue dilation given increased risk of esophageal perforation.   Azucena Freed  11/16/2018, 10:53 AM Phone [phone]

## 2018-11-16 NOTE — Progress Notes (Signed)
Inpatient Diabetes Program Recommendations  AACE/ADA: New Consensus Statement on Inpatient Glycemic Control (2015)  Target Ranges:  Prepandial:   less than 140 mg/dL      Peak postprandial:   less than 180 mg/dL (1-2 hours)      Critically ill patients:  140 - 180 mg/dL   Lab Results  Component Value Date   GLUCAP 218 (H) 11/16/2018   HGBA1C 12.3 (H) 11/12/2018    Review of Glycemic Control Results for [name], [name] (MRN [id number]) as of 11/16/2018 15:37  Ref. Range 11/16/2018 04:19 11/16/2018 07:45 11/16/2018 12:36  Glucose-Capillary Latest Ref Range: 70 - 99 mg/dL 105 (H) 178 (H) 218 (H)   Diabetes history:Type 1 DM (requires basal and meal coverage) Outpatient Diabetes medications:Novolog 70/30 15 units QAM, 10 units QPM Current orders for Inpatient glycemic control:Novolog 0-9 units Q4H, Levemir 3 units QHS  Inpatient Diabetes Program Recommendations:  NPO this AM. Watch given procedure. Anticipate need for meal coverage and changing correction to TID.   Attempted to speak with patient. Sleeping despite attempts at arousal. Will attempt at a later time.   Thanks, [name] Curb, MSN, RNC-OB Diabetes Coordinator [phone] (8a-5p)

## 2018-11-16 NOTE — Transfer of Care (Signed)
Immediate Anesthesia Transfer of Care Note  Patient: [name]  Procedure(s) Performed: ESOPHAGOGASTRODUODENOSCOPY (EGD) WITH PROPOFOL (N/A ) BIOPSY  Patient Location: PACU  Anesthesia Type:MAC  Level of Consciousness: drowsy, patient cooperative and responds to stimulation  Airway & Oxygen Therapy: Patient Spontanous Breathing and Patient connected to nasal cannula oxygen  Post-op Assessment: Report given to RN and Post -op Vital signs reviewed and stable  Post vital signs: Reviewed and stable  Last Vitals:  Vitals Value Taken Time  BP 104/37 11/16/18 1452  Temp    Pulse 65 11/16/18 1453  Resp 12 11/16/18 1453  SpO2 99 % 11/16/18 1453  Vitals shown include unvalidated device data.  Last Pain:  Vitals:   11/16/18 1451  TempSrc:   PainSc: 0-No pain      Patients Stated Pain Goal: 0 (03/49/17 9150)  Complications: No apparent anesthesia complications

## 2018-11-16 NOTE — Progress Notes (Signed)
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   Special Requests Immunocompromised  Final   Gram Stain   Final    CYTOSPIN SMEAR WBC PRESENT,BOTH PMN AND MONONUCLEAR NO ORGANISMS SEEN    Culture   Final    NO GROWTH 3 DAYS Performed at [facility], [place] [address] [place], [place] [address]    Report Status 11/14/2018 FINAL  Final  HSV 1/2 Ab IgG/IgM CSF     Status: None   Collection Time: 11/12/18  5:58 PM   Specimen: Cerebrospinal Fluid  Result Value Ref Range Status   HSV 1/2 Ab, IgM, CSF NOCSF IV Final    Comment: (NOTE) No Spinal Fluid received. Notified Estill Bamberg Ronsick 4:31pm 11/15/2018-[place]    HSV 1/2 Ab Screen IgG, CSF NOT PERFORMED  Final    Comment: (NOTE) Test not performed Performed At: [facility] [address] [place], [place] [id number] Esau Grew MD PY:[id number]   MRSA PCR Screening     Status: None    Collection Time: 11/12/18  9:19 PM   Specimen: Nasopharyngeal  Result Value Ref Range Status   MRSA by PCR NEGATIVE NEGATIVE Final    Comment:        The GeneXpert MRSA Assay (FDA approved for NASAL specimens only), is one component of a comprehensive MRSA colonization surveillance program. It is not intended to diagnose MRSA infection nor to guide or monitor treatment for MRSA infections. Performed at [facility], [name] [address], [place], [place] [address]     Radiology Studies: Dg Foot 2 Views Right  Result Date: 11/14/2018 CLINICAL DATA:  Chronic right second and third toe pain. No recent injury. EXAM: RIGHT FOOT - 2 VIEW COMPARISON:  Plain films right foot 08/12/2018. 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 No acute bony or joint abnormality is identified. Small linear metallic foreign body in the plantar soft tissues of the heel is unchanged. Extensive atherosclerosis noted. IMPRESSION: No change compared to the prior exam. 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 Small linear radiopaque foreign body in the soft tissues of the heel. Atherosclerosis. 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 Electronically Signed   By: Inge Rise M.D.   On: 11/14/2018 14:57   Scheduled Meds: . [MAR Hold] diclofenac sodium  2 g Topical QID  . [MAR Hold] feeding supplement (ENSURE ENLIVE)  237 mL Oral BID BM  . [MAR Hold] fluconazole  100 mg Oral Daily  . [MAR Hold] folic acid  1 mg Oral Daily  . [MAR Hold] gabapentin  400 mg Oral TID  . [MAR Hold] insulin aspart  0-9 Units Subcutaneous Q4H  . [MAR Hold] insulin detemir  3 Units Subcutaneous QHS  . [MAR Hold] lidocaine  1 patch Transdermal Q24H  . [MAR Hold] mouth rinse  15 mL Mouth Rinse BID  . [MAR Hold] multivitamin with minerals  1 tablet  Oral Daily  . [MAR Hold] mupirocin cream   Topical BID  . [MAR Hold] nystatin  5 mL Oral QID  . [facility]  Hold] pantoprazole  40 mg Oral BID  . [MAR Hold] polyethylene glycol  17 g Oral BID  . [MAR Hold] senna-docusate  1 tablet Oral BID  . [MAR Hold] tamsulosin  0.4 mg Oral Daily   Continuous Infusions: . sodium chloride Stopped (11/16/18 1230)    LOS: 5 days   Kerney Elbe, DO [name] PAGER is on [place]  If 7PM-7AM, please contact night-coverage [email or web address] Password [email or web address] 11/16/2018, 2:29 PM

## 2018-11-16 NOTE — Progress Notes (Signed)
Pt on strict clear liquid diet until 1730. Tolerated Gelatin, and apple juice well. No complaints or distress noted. Will allow soft diet as tolerated at 1730.

## 2018-11-16 NOTE — Anesthesia Procedure Notes (Signed)
Procedure Name: MAC Date/Time: 11/16/2018 2:25 PM Performed by: Harden Mo, CRNA Pre-anesthesia Checklist: Patient identified, Emergency Drugs available, Suction available and Patient being monitored Patient Re-evaluated:Patient Re-evaluated prior to induction Oxygen Delivery Method: Nasal cannula Preoxygenation: Pre-oxygenation with 100% oxygen Induction Type: IV induction Placement Confirmation: positive ETCO2 and breath sounds checked- equal and bilateral Dental Injury: Teeth and Oropharynx as per pre-operative assessment

## 2018-11-16 NOTE — Op Note (Signed)
[facility] Patient Name: [name] Procedure Date : 11/16/2018 MRN: [id number] Attending MD: Jerene Bears , MD Date of Birth: [birth year] CSN: [id number] Age: 63 Admit Type: Inpatient Procedure:                Upper GI endoscopy Indications:              Dysphagia, Follow-up of severe esophagitis seen in                            May 2020 at EGD Providers:                Lajuan Lines. Hilarie Fredrickson, MD, Carlyn Reichert, RN, Elspeth Cho                            Tech., Technician, Garrison [place], CRNA Referring MD:             [facility] Medicines:                Monitored Anesthesia Care Complications:            No immediate complications. Estimated Blood Loss:     Estimated blood loss was minimal. Procedure:                Pre-Anesthesia Assessment:                           - Prior to the procedure, a History and Physical                            was performed, and patient medications and                            allergies were reviewed. The patient's tolerance of                            previous anesthesia was also reviewed. The risks                            and benefits of the procedure and the sedation                            options and risks were discussed with the patient.                            All questions were answered, and informed consent                            was obtained. Prior Anticoagulants: The patient has                            taken no previous anticoagulant or antiplatelet                            agents. ASA Grade Assessment: III - A patient with  severe systemic disease. After reviewing the risks                            and benefits, the patient was deemed in                            satisfactory condition to undergo the procedure.                           After obtaining informed consent, the endoscope was                            passed under direct vision. Throughout the               procedure, the patient's blood pressure, pulse, and                            oxygen saturations were monitored continuously. The                            GIF-H190 ([id number]) Olympus gastroscope was                            introduced through the mouth, and advanced to the                            second part of duodenum. The upper GI endoscopy was                            accomplished without difficulty. The patient                            tolerated the procedure well. Scope In: Scope Out: Findings:      Severe esophagitis with no bleeding was found 35 to 41 cm from the       incisors. This is circumferentia and the mucosa in this segment is       covered with thick white exudate. Biopsies were taken with a cold       forceps for histology.      Two benign-appearing, intrinsic moderate (circumferential scarring or       stenosis; an endoscope may pass) stenoses were found 40 and 41 cm from       the incisors. The Z-line is very slightly irregular at 41 cm. The       narrowest stenosis measured 8 mm (inner diameter) x 1 cm (in length) and       was at 40 cm from the incisors. The stenoses were traversed with the       standard adult upper endoscope but required firm pressure to pass. After       passage of the endoscope multiple mucosal rents were present at the       proximal stricture with self-limited bleeding. Due to the dilation       performed by the endoscope, further dilation with balloon or bougie was       not performed today.      The entire examined stomach was normal.  A single small angioectasia without bleeding was found in the second       portion of the duodenum.      The exam of the duodenum was otherwise normal. Impression:               - Severe esophagitis. Biopsied.                           - Benign-appearing esophageal stenoses. Dilated                            with adult upper endoscope (as described above).                            - Normal stomach.                           - A single non-bleeding angioectasia in the                            duodenum. Moderate Sedation:      N/A Recommendation:           - Return patient to hospital ward for ongoing care.                           - Clear liquids x 2 hours (post-dilation) then                            advanced to soft diet as tolerated.                           - Continue present medications, including BID PPI.                           - Await pathology results.                           - Repeat upper endoscopy in 2-4 weeks with Dr.                            Rush Landmark for retreatment/further esophageal                            dilation.                           - I will arrange follow-up with Dr. Rush Landmark.                            Call GI consult team with questions. Procedure Code(s):        --- Professional ---                           [phone], Esophagogastroduodenoscopy, flexible,                            transoral; with biopsy, single  or multiple Diagnosis Code(s):        --- Professional ---                           K20.9, Esophagitis, unspecified                           K22.2, Esophageal obstruction                           R13.10, Dysphagia, unspecified CPT copyright 2019 American Medical Association. All rights reserved. The codes documented in this report are preliminary and upon coder review may  be revised to meet current compliance requirements. Jerene Bears, MD 11/16/2018 3:05:34 PM This report has been signed electronically. Number of Addenda: 0

## 2018-11-16 NOTE — Anesthesia Preprocedure Evaluation (Signed)
Anesthesia Evaluation  Patient identified by MRN, date of birth, ID band Patient awake    Reviewed: Allergy & Precautions, NPO status , Patient's Chart, lab work & pertinent test results  Airway Mallampati: II  TM Distance: >3 FB Neck ROM: Full    Dental  (+) Dental Advisory Given   Pulmonary COPD, Current Smoker,    breath sounds clear to auscultation       Cardiovascular hypertension, Pt. on medications + Peripheral Vascular Disease   Rhythm:Regular Rate:Normal     Neuro/Psych PSYCHIATRIC DISORDERS negative neurological ROS     GI/Hepatic GERD  ,(+) Hepatitis -, C  Endo/Other  diabetes (DKA), Type 1  Renal/GU Renal disease     Musculoskeletal   Abdominal   Peds  Hematology  (+) anemia ,   Anesthesia Other Findings   Reproductive/Obstetrics                             Anesthesia Physical Anesthesia Plan  ASA: III  Anesthesia Plan: MAC   Post-op Pain Management:    Induction: Intravenous  PONV Risk Score and Plan: 0 and Propofol infusion, Ondansetron and Treatment may vary due to age or medical condition  Airway Management Planned: Natural Airway and Nasal Cannula  Additional Equipment:   Intra-op Plan:   Post-operative Plan:   Informed Consent: I have reviewed the patients History and Physical, chart, labs and discussed the procedure including the risks, benefits and alternatives for the proposed anesthesia with the patient or authorized representative who has indicated his/her understanding and acceptance.       Plan Discussed with: CRNA  Anesthesia Plan Comments:         Anesthesia Quick Evaluation

## 2018-11-16 NOTE — Progress Notes (Signed)
Pharmacy Antibiotic Note  [name] is a 63 y.o. male admitted on 11/11/2018 now with oral candidiasis.  Pharmacy has been consulted for Fluconazole dosing.  ID: Fluconazole for oropharyngeal candidiasis. (also on nystatin oral) WBC 8 stable, Afebrile. Scr 1.23 up. -Was on CTX/vanco/Amp/Acyclovir for sepsis/meningitis, D/C d/t negative CSF and cxs.  7/31 Fluconazole >>  7/29 Vanc >>7/31 7/29 CTX >>7/31 7/29 Amp >>7/31 7/30 Acyclovir >>7/31  7/29: CSF negative 7/29: Bcx2 NG1D 7/29: Urine contaminated 7/29: HSV pending 7/29: Crypto Ag negative 7/29: COVID negative 7/30: MRSA PCR negative   Plan:  Fluconazole 100 mg once daily for at least 3 weeks; continue treatment for at least 2 weeks following resolution of symptoms (FDA dosage)  Pharmacy will sign off. Please reconsult for further dosing assitance.    Height: 6' (182.9 cm) Weight: 138 lb 12.8 oz (63 kg) IBW/kg (Calculated) : 77.6  Temp (24hrs), Avg:98.1 F (36.7 C), Min:97.5 F (36.4 C), Max:98.7 F (37.1 C)  Recent Labs  Lab 11/11/18 0103 11/11/18 0626  11/11/18 1252  11/11/18 1540  11/12/18 0420 11/13/18 0436 11/14/18 0353 11/15/18 0422 11/16/18 0414  WBC 13.1*  --   --   --   --   --    < > 17.2* 10.1 8.2 7.2 8.0  CREATININE 1.84*  --    < >  --    < >  --    < > 1.30* 0.90 0.92 1.07 1.23  LATICACIDVEN 5.8* 3.6*  --  2.5*  --  1.2  --   --   --   --   --   --    < > = values in this interval not displayed.    Estimated Creatinine Clearance: 55.5 mL/min (by C-G formula based on SCr of 1.23 mg/dL).    Allergies  Allergen Reactions  . Codeine Itching    Tolerates hydrocodone/apap    Leni Pankonin S. Alford Highland, PharmD, [place] Clinical Staff Pharmacist Eilene Ghazi [place] 11/16/2018 11:27 AM

## 2018-11-16 NOTE — Anesthesia Postprocedure Evaluation (Signed)
Anesthesia Post Note  Patient: [name]  Procedure(s) Performed: ESOPHAGOGASTRODUODENOSCOPY (EGD) WITH PROPOFOL (N/A ) BIOPSY     Patient location during evaluation: PACU Anesthesia Type: MAC Level of consciousness: awake and alert Pain management: pain level controlled Vital Signs Assessment: post-procedure vital signs reviewed and stable Respiratory status: spontaneous breathing, nonlabored ventilation, respiratory function stable and patient connected to nasal cannula oxygen Cardiovascular status: stable and blood pressure returned to baseline Postop Assessment: no apparent nausea or vomiting Anesthetic complications: no    Last Vitals:  Vitals:   11/16/18 1511 11/16/18 1527  BP: (!) 118/40 (!) 135/53  Pulse:  65  Resp: 18 13  Temp:    SpO2:  98%    Last Pain:  Vitals:   11/16/18 1527  TempSrc:   PainSc: 10-Worst pain ever                 Tiajuana Amass

## 2018-11-17 LAB — CBC WITH DIFFERENTIAL/PLATELET
Abs Immature Granulocytes: 0.06 10*3/uL (ref 0.00–0.07)
Basophils Absolute: 0.1 10*3/uL (ref 0.0–0.1)
Basophils Relative: 1 %
Eosinophils Absolute: 0.4 10*3/uL (ref 0.0–0.5)
Eosinophils Relative: 5 %
HCT: 27.5 % — ABNORMAL LOW (ref 39.0–52.0)
Hemoglobin: 8.7 g/dL — ABNORMAL LOW (ref 13.0–17.0)
Immature Granulocytes: 1 %
Lymphocytes Relative: 31 %
Lymphs Abs: 2.5 10*3/uL (ref 0.7–4.0)
MCH: 27 pg (ref 26.0–34.0)
MCHC: 31.6 g/dL (ref 30.0–36.0)
MCV: 85.4 fL (ref 80.0–100.0)
Monocytes Absolute: 0.9 10*3/uL (ref 0.1–1.0)
Monocytes Relative: 11 %
Neutro Abs: 4.3 10*3/uL (ref 1.7–7.7)
Neutrophils Relative %: 51 %
Platelets: 302 10*3/uL (ref 150–400)
RBC: 3.22 MIL/uL — ABNORMAL LOW (ref 4.22–5.81)
RDW: 14.6 % (ref 11.5–15.5)
WBC: 8.2 10*3/uL (ref 4.0–10.5)
nRBC: 0 % (ref 0.0–0.2)

## 2018-11-17 LAB — COMPREHENSIVE METABOLIC PANEL
ALT: 29 U/L (ref 0–44)
AST: 32 U/L (ref 15–41)
Albumin: 1.9 g/dL — ABNORMAL LOW (ref 3.5–5.0)
Alkaline Phosphatase: 158 U/L — ABNORMAL HIGH (ref 38–126)
Anion gap: 6 (ref 5–15)
BUN: 23 mg/dL (ref 8–23)
CO2: 29 mmol/L (ref 22–32)
Calcium: 8.5 mg/dL — ABNORMAL LOW (ref 8.9–10.3)
Chloride: 105 mmol/L (ref 98–111)
Creatinine, Ser: 1.1 mg/dL (ref 0.61–1.24)
GFR calc Af Amer: >60 mL/min (ref >60)
GFR calc non Af Amer: >60 mL/min (ref >60)
Glucose, Bld: 55 mg/dL — ABNORMAL LOW (ref 70–99)
Potassium: 4.5 mmol/L (ref 3.5–5.1)
Sodium: 140 mmol/L (ref 135–145)
Total Bilirubin: 0.2 mg/dL — ABNORMAL LOW (ref 0.3–1.2)
Total Protein: 5.1 g/dL — ABNORMAL LOW (ref 6.5–8.1)

## 2018-11-17 LAB — MAGNESIUM: Magnesium: 2 mg/dL (ref 1.7–2.4)

## 2018-11-17 LAB — GLUCOSE, CAPILLARY
Glucose-Capillary: 159 mg/dL — ABNORMAL HIGH (ref 70–99)
Glucose-Capillary: 216 mg/dL — ABNORMAL HIGH (ref 70–99)
Glucose-Capillary: 248 mg/dL — ABNORMAL HIGH (ref 70–99)
Glucose-Capillary: 249 mg/dL — ABNORMAL HIGH (ref 70–99)
Glucose-Capillary: 303 mg/dL — ABNORMAL HIGH (ref 70–99)
Glucose-Capillary: 50 mg/dL — ABNORMAL LOW (ref 70–99)

## 2018-11-17 LAB — PHOSPHORUS: Phosphorus: 3.5 mg/dL (ref 2.5–4.6)

## 2018-11-17 MED ORDER — INSULIN ASPART 100 UNIT/ML ~~LOC~~ SOLN
0.0000 [IU] | Freq: Three times a day (TID) | SUBCUTANEOUS | Status: DC
  Administered 2018-11-17: 17:00:00 7 [IU] via SUBCUTANEOUS
  Administered 2018-11-18: 09:00:00 9 [IU] via SUBCUTANEOUS
  Administered 2018-11-18: 13:00:00 3 [IU] via SUBCUTANEOUS
  Administered 2018-11-18: 5 [IU] via SUBCUTANEOUS
  Administered 2018-11-19: 2 [IU] via SUBCUTANEOUS
  Administered 2018-11-19: 12:00:00 12 [IU] via SUBCUTANEOUS
  Administered 2018-11-20: 3 [IU] via SUBCUTANEOUS
  Administered 2018-11-20: 09:00:00 7 [IU] via SUBCUTANEOUS

## 2018-11-17 MED ORDER — INSULIN DETEMIR 100 UNIT/ML ~~LOC~~ SOLN
3.0000 [IU] | Freq: Two times a day (BID) | SUBCUTANEOUS | Status: DC
  Administered 2018-11-17 – 2018-11-18 (×3): 3 [IU] via SUBCUTANEOUS
  Filled 2018-11-17 (×5): qty 0.03

## 2018-11-17 MED ORDER — INSULIN ASPART 100 UNIT/ML ~~LOC~~ SOLN
2.0000 [IU] | Freq: Three times a day (TID) | SUBCUTANEOUS | Status: DC
  Administered 2018-11-17 – 2018-11-18 (×4): 2 [IU] via SUBCUTANEOUS

## 2018-11-17 MED ORDER — INSULIN ASPART 100 UNIT/ML ~~LOC~~ SOLN
0.0000 [IU] | Freq: Every day | SUBCUTANEOUS | Status: DC
  Administered 2018-11-17: 2 [IU] via SUBCUTANEOUS

## 2018-11-17 NOTE — NC FL2 (Signed)
[place] LEVEL OF CARE SCREENING TOOL     IDENTIFICATION  Patient Name: [name] Birthdate: Jan 05, 1956 Sex: male Admission Date (Current Location): 11/11/2018  [facility] and [place] Number:  Herbalist and Address:  [facility] [place]. [facility], [place] [address] [place], [place] [address]      Provider Number: [id number]  Attending Physician Name and Address:  Kerney Elbe, DO  Relative Name and Phone Number:       Current Level of Care: Hospital Recommended Level of Care: [place] Prior Approval Number:    Date Approved/Denied:   PASRR Number: [id number]  Discharge Plan: SNF    Current Diagnoses: Patient Active Problem List   Diagnosis Date Noted  . Adjustment disorder with mixed disturbance of emotions and conduct   . Sepsis ([place]) 11/11/2018  . Hyperkalemia 11/11/2018  . Esophageal dysphagia 10/13/2018  . Acute esophagitis 10/13/2018  . Loose stools 10/13/2018  . Routine general medical examination at a health care facility 10/13/2018  . Left lower lobe pneumonia ([place]) 10/07/2018  . Acute respiratory failure ([place])   . AMS (altered mental status)   . Gastroesophageal reflux disease   . Hallucinations   . Hypoglycemia associated with diabetes ([place]) 08/12/2018  . At risk for adverse drug event 02/17/2018  . Abrasions of multiple sites   . Demand ischemia of myocardium ([place]) 02/05/2018  . Acute metabolic encephalopathy 63/33/5456  . DKA (diabetic ketoacidoses) ([place]) 02/03/2018  . Leukocytosis 02/03/2018  . Hypoglycemia 12/24/2017  . Syncope 12/24/2017  . Syncope and collapse 12/23/2017  . IV drug abuse ([place]) 04/19/2017  . Acute encephalopathy 04/19/2017  . Hemangioma 10/02/2016  . Foreign body (FB) in soft tissue   . 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[place])   . Gangrene ([place])   . Type I (juvenile type) diabetes mellitus without mention of complication, not stated as uncontrolled   . Tobacco abuse   .  Essential hypertension   . Diabetic infection of right foot ([place]) 07/22/2016  . Diabetic foot infection ([place]) 07/22/2016  . Chronic ulcer of heel, right, with fat layer exposed ([place]) 05/20/2016  . DKA, type 1 ([place]) 04/18/2016  . AKI (acute kidney injury) ([place]) 04/18/2016  . Hyponatremia 04/18/2016  . Severe protein-calorie malnutrition ([place]) 04/18/2016  . Elevated troponin 04/18/2016  . Poor dentition 07/12/2015  . Tobacco use 07/06/2013  . PAD (peripheral artery disease) ([place]) 04/29/2013  . COPD, mild ([place])   . 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 Diabetic neuropathy ([place]) 06/21/2010  . PROLIFERATIVE DIABETIC RETINOPATHY 06/21/2010  . SKIN TAG 01/30/2010  . Chronic hepatitis C ([place]) 11/22/2008  . VITILIGO 11/22/2008  . PROTEINURIA, MILD 11/22/2008  . Substance abuse ([place]) 04/23/2007  . DEPRESSION 11/11/2006    Orientation RESPIRATION BLADDER Height & Weight     Self, Time, Situation, Place  Normal Continent Weight: 142 lb 3.2 oz (64.5 kg) Height:  6' (182.9 cm)  BEHAVIORAL SYMPTOMS/MOOD NEUROLOGICAL BOWEL NUTRITION STATUS      Continent Diet(DYS 3 diet, thin liquids)  AMBULATORY STATUS COMMUNICATION OF NEEDS Skin   Limited Assist Verbally Other (Comment)(Open wound on coccyx, foam dressing. Open wound on head, foam dressing.Open wound on right heel.)                       Personal Care Assistance Level of Assistance  Feeding, Bathing, Dressing Bathing Assistance: Limited assistance Feeding assistance: Independent Dressing Assistance: Limited  assistance     Functional Limitations Info  Sight, Speech, Hearing Sight Info: Adequate Hearing Info: Adequate Speech Info: Adequate    SPECIAL CARE FACTORS FREQUENCY  PT (By licensed PT), OT (By licensed OT)     PT Frequency: 5x OT Frequency: 5x            Contractures Contractures Info: Not present    Additional Factors Info  Code Status, Allergies Code Status  Info: Full Code Allergies Info: Codeine           Current Medications (11/17/2018):  This is the current hospital active medication list Current Facility-Administered Medications  Medication Dose Route Frequency Provider Last Rate Last Dose  . 0.9 %  sodium chloride infusion   Intravenous Continuous Raiford Noble [place], DO 75 mL/hr at 11/17/18 0135    . diclofenac sodium (VOLTAREN) 1 % transdermal gel 2 g  2 g Topical QID Sheikh, Omair [place], DO   2 g at 11/16/18 2202  . feeding supplement (ENSURE ENLIVE) (ENSURE ENLIVE) liquid 237 mL  237 mL Oral BID BM Sheikh, Omair [place], DO   237 mL at 11/15/18 1513  . fluconazole (DIFLUCAN) tablet 100 mg  100 mg Oral Daily Skeet Simmer, RPH   100 mg at 11/16/18 1817  . folic acid (FOLVITE) tablet 1 mg  1 mg Oral Daily Raiford Noble [place], DO   1 mg at 11/16/18 1817  . gabapentin (NEURONTIN) capsule 400 mg  400 mg Oral TID Raiford Noble [place], DO   400 mg at 11/16/18 2155  . hydrALAZINE (APRESOLINE) injection 5 mg  5 mg Intravenous Q4H PRN Shela Leff, MD      . insulin aspart (novoLOG) injection 0-9 Units  0-9 Units Subcutaneous Q4H Raiford Noble [place], DO   2 Units at 11/17/18 0137  . insulin detemir (LEVEMIR) injection 3 Units  3 Units Subcutaneous QHS Raiford Noble [place], DO   3 Units at 11/16/18 2200  . lidocaine (LIDODERM) 5 % 1 patch  1 patch Transdermal Q24H Raiford Noble [place], DO   1 patch at 11/16/18 1818  . MEDLINE mouth rinse  15 mL Mouth Rinse BID Raiford Noble [place], DO   15 mL at 11/16/18 5732  . mirtazapine (REMERON) tablet 15 mg  15 mg Oral QHS SheikhGeorgina Quint [place], DO   15 mg at 11/16/18 2154  . multivitamin with minerals tablet 1 tablet  1 tablet Oral Daily Raiford Noble [place], [place]   1 tablet at 11/16/18 1816  . mupirocin cream (BACTROBAN) 2 %   Topical BID Sheikh, Georgina Quint Latif, DO      . nystatin (MYCOSTATIN) 100000 UNIT/ML suspension 500,000 Units  5 mL Oral QID Raiford Noble [place], DO   500,000 Units at 11/16/18 2204  .  ondansetron (ZOFRAN) injection 4 mg  4 mg Intravenous Q6H PRN Raiford Noble [place], DO   4 mg at 11/16/18 2025  . opium-belladonna (B&O SUPPRETTES) 16.2-60 MG suppository 1 suppository  1 suppository Rectal Q8H PRN Raiford Noble [place], DO   1 suppository at 11/16/18 2329  . pantoprazole (PROTONIX) EC tablet 40 mg  40 mg Oral BID Raiford Noble [place], DO   40 mg at 11/16/18 2155  . polyethylene glycol (MIRALAX / GLYCOLAX) packet 17 g  17 g Oral BID Raiford Noble [place], DO   17 g at 11/16/18 2219  . senna-docusate (Senokot-S) tablet 1 tablet  1 tablet Oral BID Raiford Noble [place], DO   1 tablet at 11/16/18 2155  . tamsulosin (FLOMAX) capsule  0.4 mg  0.4 mg Oral Daily Raiford Noble [place], DO   0.4 mg at 11/16/18 1817  . traMADol (ULTRAM) tablet 50 mg  50 mg Oral Q12H PRN Vertis Kelch, NP   50 mg at 11/17/18 7893     Discharge Medications: Please see discharge summary for a list of discharge medications.  Relevant Imaging Results:  Relevant Lab Results:   Additional Information [id number]  Eileen Stanford, LCSW

## 2018-11-17 NOTE — Progress Notes (Addendum)
Inpatient Diabetes Program Recommendations  AACE/ADA: New Consensus Statement on Inpatient Glycemic Control (2015)  Target Ranges:  Prepandial:   less than 140 mg/dL      Peak postprandial:   less than 180 mg/dL (1-2 hours)      Critically ill patients:  140 - 180 mg/dL   Lab Results  Component Value Date   GLUCAP 248 (H) 11/17/2018   HGBA1C 12.3 (H) 11/12/2018    Review of Glycemic Control Results for JACQUEL, MCCAMISH (MRN [id number]) as of 11/17/2018 12:47  Ref. Range 11/16/2018 12:36 11/16/2018 16:40 11/16/2018 21:03 11/17/2018 00:11 11/17/2018 06:09 11/17/2018 08:22 11/17/2018 11:01  Glucose-Capillary Latest Ref Range: 70 - 99 mg/dL 218 (H) 269 (H) 419 (H) 159 (H) 50 (L) 216 (H) 248 (H)  Diabetes history:Type 1 DM (requires basal and meal coverage) Outpatient Diabetes medications:Novolog 70/30 15 units QAM, 10 units QPM Current orders for Inpatient glycemic control:Novolog 0-9 units Q4H, Levemir 3 units QHS  Inpatient Diabetes Program Recommendations:     Note low blood sugar this AM. However note that patient received Novolog 14 units overnight.  Therefore likely not from the Levemir.    -Please reduce Novolog correction to tid with meals and HS scale.  -Consider adding Novolog 2 units tid with meals for meal coverage.  -Increase Levemir to 3 units bid.   Thanks,  Adah Perl, RN, BC-ADM Inpatient Diabetes Coordinator Pager [phone] (8a-5p)

## 2018-11-17 NOTE — Progress Notes (Signed)
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 unless the authorization is terminated or revoked sooner. Performed at [facility], [place] [address] [place], [place] [address]   Urine culture     Status: Abnormal   Collection Time: 11/11/18  1:20 AM   Specimen: Urine, Catheterized  Result Value Ref Range Status   Specimen Description URINE, CATHETERIZED  Final   Special Requests   Final    NONE Performed at [facility], [place] [address], [place], [place] [address]    Culture MULTIPLE SPECIES PRESENT, SUGGEST RECOLLECTION (A)  Final   Report Status 11/12/2018 FINAL  Final  Hsv Culture And Typing     Status: None   Collection Time: 11/11/18  5:13 AM   Specimen: Back; Cerebrospinal Fluid  Result Value Ref Range Status   HSV Culture/Type Comment  Final    Comment: (NOTE) Negative No Herpes simplex virus isolated. Performed At: [facility] [place], [place]  [id number] Rush Farmer MD GN:[id number]    Source of Sample CSF  Final    Comment: Performed at [facility], [place] [address] [place], [place] [address]  Blood Culture (routine x 2)     Status: None   Collection Time: 11/11/18  6:26 AM   Specimen: BLOOD  Result Value Ref Range Status   Specimen Description BLOOD LEFT ARM  Final   Special Requests   Final    BOTTLES DRAWN AEROBIC AND ANAEROBIC Blood Culture adequate volume   Culture   Final    NO GROWTH 5 DAYS Performed at [facility] [address], [place],  [address]    Report Status 11/16/2018 FINAL  Final  Blood Culture (routine x 2)     Status: None   Collection Time: 11/11/18  6:26 AM   Specimen: BLOOD  Result Value Ref Range Status   Specimen Description BLOOD LEFT ARM  Final   Special Requests   Final    BOTTLES DRAWN AEROBIC ONLY Blood Culture results may not be optimal due to an excessive volume of blood received in culture bottles   Culture   Final    NO GROWTH 5 DAYS Performed at [facility], [place] [address] [place],  [address]    Report Status 11/16/2018 FINAL  Final  CSF culture     Status: None   Collection Time: 11/11/18  7:17 AM   Specimen: CSF; Cerebrospinal Fluid  Result Value Ref Range Status   Specimen Description CSF  Final   Special Requests Immunocompromised  Final   Gram Stain   Final    CYTOSPIN SMEAR WBC PRESENT,BOTH PMN AND MONONUCLEAR  NO ORGANISMS SEEN    Culture   Final    NO GROWTH 3 DAYS Performed at [facility], [place] [address] [place], [place] [address]    Report Status 11/14/2018 FINAL  Final  HSV 1/2 Ab IgG/IgM CSF     Status: None   Collection Time: 11/12/18  5:58 PM   Specimen: Cerebrospinal Fluid  Result Value Ref Range Status   HSV 1/2 Ab, IgM, CSF NOCSF IV Final    Comment: (NOTE) No Spinal Fluid received. Notified Estill Bamberg Ronsick 4:31pm 11/15/2018-[place]    HSV 1/2 Ab Screen IgG, CSF NOT PERFORMED  Final    Comment:  (NOTE) Test not performed Performed At: [facility] [address] [place], [place] [id number] Esau Grew MD YS:[id number]   MRSA PCR Screening     Status: None   Collection Time: 11/12/18  9:19 PM   Specimen: Nasopharyngeal  Result Value Ref Range Status   MRSA by PCR NEGATIVE NEGATIVE Final    Comment:        The GeneXpert MRSA Assay (FDA approved for NASAL specimens only), is one component of a comprehensive MRSA colonization surveillance program. It is not intended to diagnose MRSA infection nor to guide or monitor treatment for MRSA infections. Performed at [facility], [place] [address], [place], [place] [address]     Radiology Studies: No results found. Scheduled Meds:  diclofenac sodium  2 g Topical QID   feeding supplement (ENSURE ENLIVE)  237 mL Oral BID BM   fluconazole  100 mg Oral Daily   folic acid  1 mg Oral Daily   gabapentin  400 mg Oral TID   insulin aspart  0-5 Units Subcutaneous QHS   insulin aspart  0-9 Units Subcutaneous TID WC   insulin aspart  2 Units Subcutaneous TID WC   insulin detemir  3 Units Subcutaneous BID   lidocaine  1 patch Transdermal Q24H   mouth rinse  15 mL Mouth Rinse BID   mirtazapine  15 mg Oral QHS   multivitamin with minerals  1 tablet Oral Daily   mupirocin cream   Topical BID   nystatin  5 mL Oral QID   pantoprazole  40 mg Oral BID   polyethylene glycol  17 g Oral BID   senna-docusate  1 tablet Oral BID   tamsulosin  0.4 mg Oral Daily   Continuous Infusions:  sodium chloride 75 mL/hr at 11/17/18 1714    LOS: 6 days   Kerney Elbe, DO [name] PAGER is on AMION  If 7PM-7AM, please contact night-coverage [email or web address] Password [facility] 11/17/2018, 7:24 PM

## 2018-11-18 ENCOUNTER — Telehealth: Payer: Self-pay

## 2018-11-18 DIAGNOSIS — F4325 Adjustment disorder with mixed disturbance of emotions and conduct: Secondary | ICD-10-CM

## 2018-11-18 LAB — CBC WITH DIFFERENTIAL/PLATELET
Abs Immature Granulocytes: 0.06 10*3/uL (ref 0.00–0.07)
Basophils Absolute: 0.1 10*3/uL (ref 0.0–0.1)
Basophils Relative: 1 %
Eosinophils Absolute: 0.4 10*3/uL (ref 0.0–0.5)
Eosinophils Relative: 4 %
HCT: 27.4 % — ABNORMAL LOW (ref 39.0–52.0)
Hemoglobin: 8.7 g/dL — ABNORMAL LOW (ref 13.0–17.0)
Immature Granulocytes: 1 %
Lymphocytes Relative: 29 %
Lymphs Abs: 2.7 10*3/uL (ref 0.7–4.0)
MCH: 27.8 pg (ref 26.0–34.0)
MCHC: 31.8 g/dL (ref 30.0–36.0)
MCV: 87.5 fL (ref 80.0–100.0)
Monocytes Absolute: 1.1 10*3/uL — ABNORMAL HIGH (ref 0.1–1.0)
Monocytes Relative: 11 %
Neutro Abs: 5.1 10*3/uL (ref 1.7–7.7)
Neutrophils Relative %: 54 %
Platelets: 323 10*3/uL (ref 150–400)
RBC: 3.13 MIL/uL — ABNORMAL LOW (ref 4.22–5.81)
RDW: 15 % (ref 11.5–15.5)
WBC: 9.4 10*3/uL (ref 4.0–10.5)
nRBC: 0 % (ref 0.0–0.2)

## 2018-11-18 LAB — GLUCOSE, CAPILLARY
Glucose-Capillary: 157 mg/dL — ABNORMAL HIGH (ref 70–99)
Glucose-Capillary: 234 mg/dL — ABNORMAL HIGH (ref 70–99)
Glucose-Capillary: 265 mg/dL — ABNORMAL HIGH (ref 70–99)
Glucose-Capillary: 297 mg/dL — ABNORMAL HIGH (ref 70–99)
Glucose-Capillary: 301 mg/dL — ABNORMAL HIGH (ref 70–99)
Glucose-Capillary: 389 mg/dL — ABNORMAL HIGH (ref 70–99)

## 2018-11-18 LAB — COMPREHENSIVE METABOLIC PANEL
ALT: 27 U/L (ref 0–44)
AST: 31 U/L (ref 15–41)
Albumin: 1.8 g/dL — ABNORMAL LOW (ref 3.5–5.0)
Alkaline Phosphatase: 177 U/L — ABNORMAL HIGH (ref 38–126)
Anion gap: 6 (ref 5–15)
BUN: 23 mg/dL (ref 8–23)
CO2: 28 mmol/L (ref 22–32)
Calcium: 8.5 mg/dL — ABNORMAL LOW (ref 8.9–10.3)
Chloride: 101 mmol/L (ref 98–111)
Creatinine, Ser: 1.15 mg/dL (ref 0.61–1.24)
GFR calc Af Amer: >60 mL/min (ref >60)
GFR calc non Af Amer: >60 mL/min (ref >60)
Glucose, Bld: 278 mg/dL — ABNORMAL HIGH (ref 70–99)
Potassium: 5.2 mmol/L — ABNORMAL HIGH (ref 3.5–5.1)
Sodium: 135 mmol/L (ref 135–145)
Total Bilirubin: 0.4 mg/dL (ref 0.3–1.2)
Total Protein: 5.1 g/dL — ABNORMAL LOW (ref 6.5–8.1)

## 2018-11-18 LAB — PHOSPHORUS: Phosphorus: 2.8 mg/dL (ref 2.5–4.6)

## 2018-11-18 LAB — MAGNESIUM: Magnesium: 2 mg/dL (ref 1.7–2.4)

## 2018-11-18 NOTE — Care Management Important Message (Signed)
Important Message  Patient Details  Name: [name] MRN: [id number] Date of Birth: Nov 03, 1955   Medicare Important Message Given:  Yes     [name] 11/18/2018, 1:23 PM

## 2018-11-18 NOTE — Progress Notes (Signed)
PROGRESS NOTE    [name]  EYE:[id number] DOB: October 14, 1955 DOA: 11/11/2018 PCP: Patient, No Pcp Per   Brief Narrative: [name] is a 63 y.o. male with a past medical history significant for but not limited to depression, insulin-dependent diabetes mellitus, drug abuse, hepatitis C which is chronic, hypertension. Patient presented with DKA with initial concern for SIRS. Infectious etiology ruled out.   Assessment & Plan:   Principal Problem:   Adjustment disorder with mixed disturbance of emotions and conduct Active Problems:   AKI (acute kidney injury) ([place])   DKA (diabetic ketoacidoses) ([name]   AMS (altered mental status)   Sepsis ([place])   Hyperkalemia   DKA Diabetes mellitus, type 1 Evidence of acidemia on VBG with associated hyperglycemia and urine ketones. Associated elevated anion gap acidosis with bicarb of 13 and anion gap of 25 on admission. Patient initially managed with IV insulin and transitioned to Levemir -Continue Levemir and SSI  Esophagitis Oral candidiasis Likely cause of dysphagia. Patient also with oral candidiasis. Started on diflucan and nystatin mouth wash. Also on Protonix. Biopsy obtained -Continue Lasix, Diflucan, nystatin and follow-up biopsy results; if no evidence of candidal esophagitis, will discontinue diflucan  AMS Initial concern for infectious cause, including meningitis. Imaging unrevealing. LP performed and not suggestive of meningitis. Cultures (including HSV) negative. AMS resolved. Likely related to DKA. Antiinfectives discontiued  AKI In setting of dehydration and bladder outlet obstruction. Improved with rehydration and foley placement. -Likely discontinue IV fluids in AM  Acute urinary retention Unknown etiology. Failed voiding trial. Will recommend urology follow-up outpatient. -Continue Flomax  Hyperkalemia Mild. -repeat BMP in AM  Anemia Normocytic. Stable.  SIRS Infectious source ruled out. Likely secondary to  DKA/dehydration.  Multiple rib fractures -Pain management  Suicidal ideation Seen by psychiatry. Started on Remeron. No inpatient psychiatry needs.  History of drug abuse No recent use per patient.    DVT prophylaxis: SCDs Code Status:   Code Status: Full Code Family Communication: None Disposition Plan: Discharge to SNF   Consultants:   Neurology  Psychiatry  Gastroenterology  Procedures:   LP  EGD Findings: Severe esophagitis with no bleeding was found 35 to 41 cm from the  incisors. This is circumferentia and the mucosa in this segment is  covered with thick white exudate. Biopsies were taken with a cold  forceps for histology. Two benign-appearing, intrinsic moderate (circumferential scarring or  stenosis; an endoscope may pass) stenoses were found 40 and 41 cm from  the incisors. The Z-line is very slightly irregular at 41 cm. The  narrowest stenosis measured 8 mm (inner diameter) x 1 cm (in length) and  was at 40 cm from the incisors. The stenoses were traversed with the  standard adult upper endoscope but required firm pressure to pass. After  passage of the endoscope multiple mucosal rents were present at the  proximal stricture with self-limited bleeding. Due to the dilation  performed by the endoscope, further dilation with balloon or bougie was  not performed today. The entire examined stomach was normal. A single small angioectasia without bleeding was found in the second  portion of the duodenum. The exam of the duodenum was otherwise normal. Impression: - Severe esophagitis. Biopsied. - Benign-appearing esophageal stenoses. Dilated  with adult upper endoscope (as described above). - Normal stomach. - A single non-bleeding angioectasia in the   duodenum.  Antimicrobials:  Diflucan  Acycylovir  Ceftriaxone  Ampicillin  Vancomycin   Subjective: No issues today medically. Many social concerns  with regard to living arrangements.  Objective: Vitals:   11/17/18 1323 11/17/18 1950 11/18/18 0446 11/18/18 1300  BP: (!) 112/55 125/66 (!) 150/75 (!) 122/53  Pulse: 76 79 75 74  Resp: 15 16 18 18   Temp: 98 F (36.7 C) 98.5 F (36.9 C) (!) 97.5 F (36.4 C) 97.7 F (36.5 C)  TempSrc: Oral Oral Oral Oral  SpO2: 99% 95% 95% 97%  Weight:   63.2 kg   Height:        Intake/Output Summary (Last 24 hours) at 11/18/2018 2007 Last data filed at 11/18/2018 1700 Gross per 24 hour  Intake 1690 ml  Output 3980 ml  Net -2290 ml   Filed Weights   11/15/18 0353 11/17/18 0552 11/18/18 0446  Weight: 63 kg 64.5 kg 63.2 kg    Examination:  General exam: Appears calm and comfortable Respiratory system: Clear to auscultation. Respiratory effort normal. Cardiovascular system: S1 & S2 heard, RRR. No murmurs, rubs, gallops or clicks. Gastrointestinal system: Abdomen is nondistended, soft and nontender. No organomegaly or masses felt. Normal bowel sounds heard. Central nervous system: Alert and oriented. No focal neurological deficits. Extremities: No edema. No calf tenderness Skin: No cyanosis. No rashes Psychiatry: Judgement and insight appear normal. Mood & affect appropriate.     Data Reviewed: I have personally reviewed following labs and imaging studies  CBC: Recent Labs  Lab 11/14/18 0353 11/15/18 0422 11/16/18 0414 11/17/18 0253 11/18/18 0526  WBC 8.2 7.2 8.0 8.2 9.4  NEUTROABS 4.5 3.7 3.8 4.3 5.1  HGB 8.9* 9.2* 8.9* 8.7* 8.7*  HCT 27.6* 28.7* 27.8* 27.5* 27.4*  MCV 84.1 85.9 85.8 85.4 87.5  PLT 266 268 283 302 505   Basic Metabolic Panel: Recent Labs  Lab 11/14/18 0353 11/15/18 0422 11/16/18 0414 11/17/18 0253 11/18/18 0526  NA 136 138 136 140 135  K 4.5 4.9 4.5 4.5 5.2*  CL 105  103 101 105 101  CO2 27 27 30 29 28   GLUCOSE 116* 78 112* 55* 278*  BUN 11 15 26* 23 23  CREATININE 0.92 1.07 1.23 1.10 1.15  CALCIUM 8.5* 8.8* 8.5* 8.5* 8.5*  MG 1.8 2.0 2.0 2.0 2.0  PHOS 2.5 2.6 2.6 3.5 2.8   GFR: Estimated Creatinine Clearance: 59.5 mL/min (by C-G formula based on SCr of 1.15 mg/dL). Liver Function Tests: Recent Labs  Lab 11/14/18 0353 11/15/18 0422 11/16/18 0414 11/17/18 0253 11/18/18 0526  AST 28 39 55* 32 31  ALT 20 25 36 29 27  ALKPHOS 110 126 162* 158* 177*  BILITOT 0.1* 0.5 0.3 0.2* 0.4  PROT 4.9* 5.4* 5.1* 5.1* 5.1*  ALBUMIN 1.8* 2.1* 1.9* 1.9* 1.8*   No results for input(s): LIPASE, AMYLASE in the last 168 hours. No results for input(s): AMMONIA in the last 168 hours. Coagulation Profile: No results for input(s): INR, PROTIME in the last 168 hours. Cardiac Enzymes: No results for input(s): CKTOTAL, CKMB, CKMBINDEX, TROPONINI in the last 168 hours. BNP (last 3 results) No results for input(s): PROBNP in the last 8760 hours. HbA1C: No results for input(s): HGBA1C in the last 72 hours. CBG: Recent Labs  Lab 11/18/18 0016 11/18/18 0443 11/18/18 0854 11/18/18 1238 11/18/18 1646  GLUCAP 297* 301* 389* 234* 265*   Lipid Profile: No results for input(s): CHOL, HDL, LDLCALC, TRIG, CHOLHDL, LDLDIRECT in the last 72 hours. Thyroid Function Tests: No results for input(s): TSH, T4TOTAL, FREET4, T3FREE, THYROIDAB in the last 72 hours. Anemia Panel: No results for input(s): VITAMINB12, FOLATE, FERRITIN, TIBC, IRON,  RETICCTPCT in the last 72 hours. Sepsis Labs: No results for input(s): PROCALCITON, LATICACIDVEN in the last 168 hours.  Recent Results (from the past 240 hour(s))  SARS Coronavirus 2 (CEPHEID - Performed in [place] hospital lab), Hosp Order     Status: None   Collection Time: 11/11/18  1:18 AM   Specimen: Urine, Clean Catch; Nasopharyngeal  Result Value Ref Range Status   SARS Coronavirus 2 NEGATIVE NEGATIVE Final    Comment:  (NOTE) If result is NEGATIVE SARS-CoV-2 target nucleic acids are NOT DETECTED. The SARS-CoV-2 RNA is generally detectable in upper and lower  respiratory specimens during the acute phase of infection. The lowest  concentration of SARS-CoV-2 viral copies this assay can detect is 250  copies / mL. A negative result does not preclude SARS-CoV-2 infection  and should not be used as the sole basis for treatment or other  patient management decisions.  A negative result may occur with  improper specimen collection / handling, submission of specimen other  than nasopharyngeal swab, presence of viral mutation(s) within the  areas targeted by this assay, and inadequate number of viral copies  (<250 copies / mL). A negative result must be combined with clinical  observations, patient history, and epidemiological information. If result is POSITIVE SARS-CoV-2 target nucleic acids are DETECTED. The SARS-CoV-2 RNA is generally detectable in upper and lower  respiratory specimens dur ing the acute phase of infection.  Positive  results are indicative of active infection with SARS-CoV-2.  Clinical  correlation with patient history and other diagnostic information is  necessary to determine patient infection status.  Positive results do  not rule out bacterial infection or co-infection with other viruses. If result is PRESUMPTIVE POSTIVE SARS-CoV-2 nucleic acids MAY BE PRESENT.   A presumptive positive result was obtained on the submitted specimen  and confirmed on repeat testing.  While 2019 novel coronavirus  (SARS-CoV-2) nucleic acids may be present in the submitted sample  additional confirmatory testing may be necessary for epidemiological  and / or clinical management purposes  to differentiate between  SARS-CoV-2 and other Sarbecovirus currently known to infect humans.  If clinically indicated additional testing with an alternate test  methodology [id number]) is advised. The SARS-CoV-2 RNA is  generally  detectable in upper and lower respiratory sp ecimens during the acute  phase of infection. The expected result is Negative. Fact Sheet for Patients:  StrictlyIdeas.no Fact Sheet for Healthcare Providers: BankingDealers.co.za This test is not yet approved or cleared by the [place] FDA and has been authorized for detection and/or diagnosis of SARS-CoV-2 by FDA under an Emergency Use Authorization (EUA).  This EUA will remain in effect (meaning this test can be used) for the duration of the COVID-19 declaration under Section 564(b)(1) of the Act, 21 U.S.C. section 360bbb-3(b)(1), unless the authorization is terminated or revoked sooner. Performed at [facility], [place] [address], [place], [place] [address]   Urine culture     Status: Abnormal   Collection Time: 11/11/18  1:20 AM   Specimen: Urine, Catheterized  Result Value Ref Range Status   Specimen Description URINE, CATHETERIZED  Final   Special Requests   Final    NONE Performed at [facility], [place] [address] [place], [place] [address]    Culture MULTIPLE SPECIES PRESENT, SUGGEST RECOLLECTION (A)  Final   Report Status 11/12/2018 FINAL  Final  Hsv Culture And Typing     Status: None   Collection Time: 11/11/18  5:13  AM   Specimen: Back; Cerebrospinal Fluid  Result Value Ref Range Status   HSV Culture/Type Comment  Final    Comment: (NOTE) Negative No Herpes simplex virus isolated. Performed At: [facility] [place], [place] [id number] Rush Farmer MD TD:[id number]    Source of Sample CSF  Final    Comment: Performed at [facility], [place] [address] [place], [place] [address]  Blood Culture (routine x 2)     Status: None   Collection Time: 11/11/18  6:26 AM   Specimen: BLOOD  Result Value Ref Range Status   Specimen Description BLOOD LEFT ARM  Final   Special Requests   Final    BOTTLES DRAWN AEROBIC AND  ANAEROBIC Blood Culture adequate volume   Culture   Final    NO GROWTH 5 DAYS Performed at [facility] [address] [place], [place] [address]    Report Status 11/16/2018 FINAL  Final  Blood Culture (routine x 2)     Status: None   Collection Time: 11/11/18  6:26 AM   Specimen: BLOOD  Result Value Ref Range Status   Specimen Description BLOOD LEFT ARM  Final   Special Requests   Final    BOTTLES DRAWN AEROBIC ONLY Blood Culture results may not be optimal due to an excessive volume of blood received in culture bottles   Culture   Final    NO GROWTH 5 DAYS Performed at [facility], [place] [address] [place], [place] [address]    Report Status 11/16/2018 FINAL  Final  CSF culture     Status: None   Collection Time: 11/11/18  7:17 AM   Specimen: CSF; Cerebrospinal Fluid  Result Value Ref Range Status   Specimen Description CSF  Final   Special Requests Immunocompromised  Final   Gram Stain   Final    CYTOSPIN SMEAR WBC PRESENT,BOTH PMN AND MONONUCLEAR NO ORGANISMS SEEN    Culture   Final    NO GROWTH 3 DAYS Performed at [facility], [place] [address] [place], [place] [address]    Report Status 11/14/2018 FINAL  Final  HSV 1/2 Ab IgG/IgM CSF     Status: None   Collection Time: 11/12/18  5:58 PM   Specimen: Cerebrospinal Fluid  Result Value Ref Range Status   HSV 1/2 Ab, IgM, CSF NOCSF IV Final    Comment: (NOTE) No Spinal Fluid received. Notified Estill Bamberg Ronsick 4:31pm 11/15/2018-[place]    HSV 1/2 Ab Screen IgG, CSF NOT PERFORMED  Final    Comment: (NOTE) Test not performed Performed At: [facility] [address] [place], [place] [id number] Esau Grew MD OE:[id number]   MRSA PCR Screening     Status: None   Collection Time: 11/12/18  9:19 PM   Specimen: Nasopharyngeal  Result Value Ref Range Status   MRSA by PCR NEGATIVE NEGATIVE Final    Comment:        The GeneXpert MRSA Assay (FDA approved for NASAL specimens only), is one  component of a comprehensive MRSA colonization surveillance program. It is not intended to diagnose MRSA infection nor to guide or monitor treatment for MRSA infections. Performed at [facility], [place] [address], [place], [place] [address]          Radiology Studies: No results found.      Scheduled Meds: . diclofenac sodium  2 g Topical QID  . feeding supplement (ENSURE ENLIVE)  237 mL Oral BID BM  . fluconazole  100 mg Oral Daily  . folic acid  1 mg Oral Daily  . gabapentin  400 mg Oral TID  . insulin aspart  0-5 Units Subcutaneous QHS  . insulin aspart  0-9 Units Subcutaneous TID WC  . insulin aspart  2 Units Subcutaneous TID WC  . insulin detemir  3 Units Subcutaneous BID  . lidocaine  1 patch Transdermal Q24H  . mouth rinse  15 mL Mouth Rinse BID  . mirtazapine  15 mg Oral QHS  . multivitamin with minerals  1 tablet Oral Daily  . mupirocin cream   Topical BID  . nystatin  5 mL Oral QID  . pantoprazole  40 mg Oral BID  . polyethylene glycol  17 g Oral BID  . senna-docusate  1 tablet Oral BID  . tamsulosin  0.4 mg Oral Daily   Continuous Infusions: . sodium chloride 75 mL/hr at 11/18/18 5284     LOS: 7 days     Cordelia Poche, MD [name] 11/18/2018, 8:07 PM  If 7PM-7AM, please contact night-coverage [email or web address]

## 2018-11-18 NOTE — Telephone Encounter (Signed)
Pt on the list and scheduled for office visit on 8/13 with Dr Jerilynn Mages

## 2018-11-18 NOTE — Progress Notes (Signed)
Occupational Therapy Treatment Patient Details Name: [name] MRN: [id number] DOB: 29-Nov-1955 Today's Date: 11/18/2018    History of present illness 63 year old Caucasian male with a past medical history significant for but not limited to depression, insulin-dependent diabetes mellitus, drug abuse, hepatitis C which is chronic, hypertension, as well as other comorbidities who presented to the emergency room for evaluation for AMS as well as hyperglycemia. Admitted 11/11/18 for treatment of DKA and acute encephalopathy (Simultaneous filing. User may not have seen previous data.)   OT comments  Pt progressing towards acute OT goals. Limited by report of dizziness/lightheadedness in standing. Stood EOB with dynamic challenges incoporated then sat EOB to complete 3 grooming tasks. Assist for balance in standing, cues for safety and to attend to task, easily distracted, verbal perseveration and tangential speech noted. D/c plan remains appropriate.   Follow Up Recommendations  SNF    Equipment Recommendations  3 in 1 bedside commode    Recommendations for Other Services Other (comment)    Precautions / Restrictions Precautions Precautions: Fall Restrictions Weight Bearing Restrictions: No      Mobility Bed Mobility Overal bed mobility: Needs Assistance(Simultaneous filing. User may not have seen previous data.) Bed Mobility: Supine to Sit;Sit to Supine(Simultaneous filing. User may not have seen previous data.)     Supine to sit: Min guard;HOB elevated(Simultaneous filing. User may not have seen previous data.) Sit to supine: Min guard;HOB elevated   General bed mobility comments: min guard for safety, HOB elevated, increased time and effort as well as use of handrails to get to side of bed(Simultaneous filing. User may not have seen previous data.)  Transfers Overall transfer level: Needs assistance(Simultaneous filing. User may not have seen previous data.) Equipment used:  Rolling walker (2 wheeled)(Simultaneous filing. User may not have seen previous data.) Transfers: Sit to/from Stand(Simultaneous filing. User may not have seen previous data.) Sit to Stand: Mod assist;+2 physical assistance(Simultaneous filing. User may not have seen previous data.)         General transfer comment: light mod A x2 to power up and steady. Cues for Hand placement on RW. (Simultaneous filing. User may not have seen previous data.)    Balance Overall balance assessment: Needs assistance(Simultaneous filing. User may not have seen previous data.) Sitting-balance support: Feet supported;No upper extremity supported(Simultaneous filing. User may not have seen previous data.) Sitting balance-Leahy Scale: Fair(Simultaneous filing. User may not have seen previous data.)     Standing balance support: Bilateral upper extremity supported;During functional activity;Single extremity supported(Simultaneous filing. User may not have seen previous data.) Standing balance-Leahy Scale: Poor(Simultaneous filing. User may not have seen previous data.) Standing balance comment: min a for balance in static standing(Simultaneous filing. User may not have seen previous data.)                           ADL either performed or assessed with clinical judgement   ADL Overall ADL's : Needs assistance/impaired     Grooming: Oral care;Wash/dry face;Brushing hair;Set up;Supervision/safety;Sitting Grooming Details (indicate cue type and reason): some cueing for attending to task, distracted easily                 Toilet Transfer: Minimal assistance;+2 for physical assistance;Moderate assistance Toilet Transfer Details (indicate cue type and reason): simulated in room to/from EOB         Functional mobility during ADLs: Minimal assistance;Moderate assistance;+2 for safety/equipment;+2 for physical assistance(walked in place due to dizziness) General ADL  Comments: Decreased activity  tolerance. Cues for safety. Stood EOB for about a minute with dynamic tasks incorporated. Pt reporting dizziness on initial stand. Stood a couple of minutes with dizziness not resolving. Sat for 3 grooming tasks then returned to supine.      Vision       Perception     Praxis      Cognition Arousal/Alertness: Awake/alert(Simultaneous filing. User may not have seen previous data.) Behavior During Therapy: Anxious(Simultaneous filing. User may not have seen previous data.) Overall Cognitive Status: History of cognitive impairments - at baseline(Simultaneous filing. User may not have seen previous data.) Area of Impairment: Safety/judgement;Awareness;Problem solving(Simultaneous filing. User may not have seen previous data.)                         Safety/Judgement: Decreased awareness of safety(Simultaneous filing. User may not have seen previous data.) Awareness: Emergent(Simultaneous filing. User may not have seen previous data.) Problem Solving: Difficulty sequencing;Requires verbal cues;Requires tactile cues(Simultaneous filing. User may not have seen previous data.) General Comments: Pt demonstrated decreased safety awareness. He stated he was an 8/10 when asked about dizziness yet was attempting to ambulate away from bed without chair to follow. Tangental speech. He is self limiting as he reports he does not want to be disabled but also states he cannot perform exercises on his own, though PTA provided him with HEP to perform supine in bed.(Simultaneous filing. User may not have seen previous data.)        Exercises Exercises: General Lower Extremity;Other exercises General Exercises - Lower Extremity Ankle Circles/Pumps: AAROM;Both;10 reps;Supine(assist required, pt unable to perform motion by himself) Quad Sets: AROM;Both;5 reps;Supine Heel Slides: AROM;Both;5 reps;Supine Hip ABduction/ADduction: AROM;Both;5 reps;Supine Straight Leg Raises: AROM;Both;5  reps;Supine Other Exercises Other Exercises: Bridge;strengthening;10 reps; Supine   Shoulder Instructions       General Comments      Pertinent Vitals/ Pain       Pain Assessment: 0-10(Simultaneous filing. User may not have seen previous data.) Pain Score: 9 (Simultaneous filing. User may not have seen previous data.) Pain Location: back. foot, R foot, and bilateral LE (Simultaneous filing. User may not have seen previous data.) Pain Descriptors / Indicators: Grimacing;Discomfort;Constant(Simultaneous filing. User may not have seen previous data.) Pain Intervention(s): Monitored during session;Limited activity within patient's tolerance(Simultaneous filing. User may not have seen previous data.)  Home Living                                          Prior Functioning/Environment              Frequency  Min 2X/week        Progress Toward Goals  OT Goals(current goals can now be found in the care plan section)  Progress towards OT goals: Progressing toward goals  Acute Rehab OT Goals Patient Stated Goal: go to rehab(Simultaneous filing. User may not have seen previous data.) OT Goal Formulation: With patient Time For Goal Achievement: 11/29/18 Potential to Achieve Goals: Good ADL Goals Pt Will Perform Grooming: with supervision Pt Will Perform Upper Body Dressing: with supervision Pt Will Perform Lower Body Dressing: with supervision Pt Will Transfer to Toilet: with supervision;ambulating Pt Will Perform Toileting - Clothing Manipulation and hygiene: with supervision;sit to/from stand  Plan Discharge plan remains appropriate    Co-evaluation    PT/OT/SLP Co-Evaluation/Treatment: Yes Reason for Co-Treatment: For  patient/therapist safety;To address functional/ADL transfers(Simultaneous filing. User may not have seen previous data.) PT goals addressed during session: Mobility/safety with mobility;Strengthening/ROM OT goals addressed during session:  ADL's and self-care;Strengthening/ROM      AM-PAC OT "6 Clicks" Daily Activity     Outcome Measure   Help from another person eating meals?: A Little Help from another person taking care of personal grooming?: A Little Help from another person toileting, which includes using toliet, bedpan, or urinal?: A Lot Help from another person bathing (including washing, rinsing, drying)?: A Lot Help from another person to put on and taking off regular upper body clothing?: A Little Help from another person to put on and taking off regular lower body clothing?: A Lot 6 Click Score: 15    End of Session Equipment Utilized During Treatment: Gait belt;Rolling walker  OT Visit Diagnosis: Other abnormalities of gait and mobility (R26.89);Muscle weakness (generalized) (M62.81);History of falling (Z91.81);Other symptoms and signs involving cognitive function;Pain;Adult, failure to thrive (R62.7)   Activity Tolerance Other (comment)(pt reporting dizziness in standing position)   Patient Left in bed;with call bell/phone within reach;with bed alarm set   Nurse Communication          Time: 1202-1227 OT Time Calculation (min): 25 min  Charges: OT General Charges $OT Visit: 1 Visit OT Treatments $Self Care/Home Management : 8-22 mins  [name], OT Acute Rehabilitation Services Pager: [phone] Office: [phone]    Hortencia Pilar 11/18/2018, 1:31 PM

## 2018-11-18 NOTE — TOC Progression Note (Signed)
Transition of Care [facility]) - Progression Note    Patient Details  Name: [name] MRN: [id number] Date of Birth: 24-Sep-1955  Transition of [facility]) CM/SW Contact  Eileen Stanford, LCSW Phone Number: 11/18/2018, 11:25 AM  Clinical Narrative:   Pt has no bed offers at this time. Pt has hx of substance abuse, and is stating he isn't going back to live with his son, initially leaving him with no clear d/c plan for after SNF. CSW to staff case with supervisor.         Expected Discharge Plan and Services           Expected Discharge Date: 11/15/18                                     Social Determinants of Health (SDOH) Interventions    Readmission Risk Interventions Readmission Risk Prevention Plan 08/12/2018  Transportation Screening Complete  Medication Review [employer]) Complete  PCP or Specialist appointment within 3-5 days of discharge Complete  HRI or [place] Complete  SW Recovery Care/Counseling Consult Complete  [place] Not Applicable  Some recent data might be hidden

## 2018-11-18 NOTE — Progress Notes (Addendum)
Inpatient Diabetes Program Recommendations  AACE/ADA: New Consensus Statement on Inpatient Glycemic Control (2015)  Target Ranges:  Prepandial:   less than 140 mg/dL      Peak postprandial:   less than 180 mg/dL (1-2 hours)      Critically ill patients:  140 - 180 mg/dL   Lab Results  Component Value Date   GLUCAP 389 (H) 11/18/2018   HGBA1C 12.3 (H) 11/12/2018    Review of Glycemic Control Results for [name], [name] (MRN [id number]) as of 11/18/2018 10:10  Ref. Range 11/17/2018 16:39 11/17/2018 19:56 11/18/2018 00:16 11/18/2018 04:43 11/18/2018 08:54  Glucose-Capillary Latest Ref Range: 70 - 99 mg/dL 303 (H) 249 (H) 297 (H) 301 (H) 389 (H)  Diabetes history:Type 1 DM (requires basal and meal coverage) Outpatient Diabetes medications:Novolog 70/30 15 units QAM, 10 units QPM Current orders for Inpatient glycemic control:Novolog 0-9 units tid with meals and HS, Levemir 3 units q 12 hours, Novolog 2 units tid with meals  Inpatient Diabetes Program Recommendations:   -Increase Levemir to 5 units bid.  -Increase Novolog meal coverage to 3 units tid with meals.  Thanks , [name] Perl, RN, BC-ADM Inpatient Diabetes Coordinator Pager [phone] (8a-5p)  11:50- Spoke with patient at length regarding diabetes.  Patient spoke a lot about his current living conditions and history of drug abuse.  His main goal is to get to rehab and find a safer living condition so that he can not be around people who do drugs.  He state he has 2 sons, however they do not have a good relationship.  He has lost his meter.  Discussed and wrote down "Reli-ON" meter that can be purchased at [facility] for 15$.  Patient also gets his insulin from [facility] for 15$.  He states that he only takes N.  We discussed the importance of getting a "medical alert" bracelet to notify others that he has diabetes in case of low blood sugar/ hypoglycemia.  He states that he has had several falls related to lows.  Reminded him of the importance of  checking blood sugars prior to activity and eating snack if less than 100 mg/dL. Also encouraged him to keep snacks/hypoglycemia treatment with him at all times.  Blood sugars are difficult to control.  He needs a regimen that is simple, yet also one that prevents hypoglycemia.  Explained importance of avoiding hypoglycemia to patient.  Patient states that he still wants to talk to a therapist/psychiatrist. Will alert RN.

## 2018-11-18 NOTE — Telephone Encounter (Signed)
-----   Message from Irving Copas., MD sent at 11/18/2018  8:09 AM EDT ----- Thank you Ulice Dash. Clent Damore, he needs to be done in the hospital as I may need fluoroscopy for the first EGD. Let's get him on the list in the next 2-3 weeks. If it doesn't fit on my normal days, then we may need to find another time slot at lunch for me to try and come and help. Thanks. GM ----- Message ----- From: Jerene Bears, MD Sent: 11/16/2018   3:07 PM EDT To: Timothy Lasso, RN, Irving Copas., MD  Mystie Ormand, GM patient.  Needs follow-up and will need repeat EGD for esophageal dilation in 2-4 weeks. Path pending from EGD today.

## 2018-11-18 NOTE — Progress Notes (Signed)
Physical Therapy Treatment Patient Details Name: [name] MRN: [id number] DOB: [birth year] Today's Date: 11/18/2018    History of Present Illness 63 year old Caucasian male with a past medical history significant for but not limited to depression, insulin-dependent diabetes mellitus, drug abuse, hepatitis C which is chronic, hypertension, as well as other comorbidities who presented to the emergency room for evaluation for AMS as well as hyperglycemia. Admitted 11/11/18 for treatment of DKA and acute encephalopathy (Simultaneous filing. User may not have seen previous data.)    PT Comments    Pt is making slow progress towards goals. Today's session was limited by dizziness. Once standing EOB pt was able to march in place and remain standing for ~3 min before requiring seated break. Once supine in bed pt provided with HEP for LE strengthening. Pt states he does not want to be debilitated, however also states he cannot perform the HEP alone. He feels he needs others for motivation. Pt demonstrated decreased safety awareness and cognitive deficits. Patient would benefit from continued skilled PT to maximize safety with mobility and functional independence. Will continue to follow acutely.     Follow Up Recommendations  SNF     Equipment Recommendations  None recommended by PT    Recommendations for Other Services       Precautions / Restrictions Precautions Precautions: Fall Restrictions Weight Bearing Restrictions: No    Mobility  Bed Mobility Overal bed mobility: Needs Assistance Bed Mobility: Supine to Sit;Sit to Supine     Supine to sit: Min guard;HOB elevated Sit to supine: Min guard;HOB elevated   General bed mobility comments: min guard for safety, HOB elevated, increased time and effort as well as use of handrails to get to side of bed  Transfers Overall transfer level: Needs assistance Equipment used: Rolling walker (2 wheeled) Transfers: Sit to/from Stand Sit to  Stand: Mod assist;+2 physical assistance         General transfer comment: light mod A x2 to power up and steady. Cues for Hand placement on RW.   Ambulation/Gait Ambulation/Gait assistance: +2 physical assistance;+2 safety/equipment;Min assist           General Gait Details: Pt marched in place at [facility] as he reported he was "8/10" dizzy. min A to steady.    Stairs             Wheelchair Mobility    Modified Rankin (Stroke Patients Only)       Balance Overall balance assessment: Needs assistance Sitting-balance support: Feet supported;No upper extremity supported Sitting balance-Leahy Scale: Fair     Standing balance support: Bilateral upper extremity supported;During functional activity;Single extremity supported Standing balance-Leahy Scale: Poor Standing balance comment: min a for balance in static standing                            Cognition Arousal/Alertness: Awake/alert Behavior During Therapy: Anxious Overall Cognitive Status: History of cognitive impairments - at baseline Area of Impairment: Safety/judgement;Awareness;Problem solving                         Safety/Judgement: Decreased awareness of safety Awareness: Emergent Problem Solving: Difficulty sequencing;Requires verbal cues;Requires tactile cues General Comments: Pt demonstrated decreased safety awareness. He stated he was an 8/10 when asked about dizziness yet was attempting to ambulate away from bed without chair to follow. Tangental speech. He is self limiting as he reports he does not want to be disabled  but also states he cannot perform exercises on his own, though PTA provided him with HEP to perform supine in bed.      Exercises General Exercises - Lower Extremity Ankle Circles/Pumps: AAROM;Both;10 reps;Supine(assist required, pt unable to perform motion by himself) Quad Sets: AROM;Both;5 reps;Supine Heel Slides: AROM;Both;5 reps;Supine Hip ABduction/ADduction:  AROM;Both;5 reps;Supine Straight Leg Raises: AROM;Both;5 reps;Supine Other Exercises Other Exercises: Bridge;strengthening;10 reps; Supine    General Comments        Pertinent Vitals/Pain Pain Assessment: 0-10 Pain Score: 9  Pain Location: back. foot, R foot, and bilateral LE  Pain Descriptors / Indicators: Grimacing;Discomfort;Constant Pain Intervention(s): Monitored during session;Limited activity within patient's tolerance    Home Living                      Prior Function            PT Goals (current goals can now be found in the care plan section) Acute Rehab PT Goals Patient Stated Goal: go to rehab PT Goal Formulation: With patient Time For Goal Achievement: 11/29/18 Potential to Achieve Goals: Fair Progress towards PT goals: Progressing toward goals    Frequency    Min 2X/week      PT Plan Current plan remains appropriate    Co-evaluation PT/OT/SLP Co-Evaluation/Treatment: Yes Reason for Co-Treatment: For patient/therapist safety;To address functional/ADL transfers(Simultaneous filing. User may not have seen previous data.) PT goals addressed during session: Mobility/safety with mobility;Strengthening/ROM OT goals addressed during session: ADL's and self-care;Strengthening/ROM      AM-PAC PT "6 Clicks" Mobility   Outcome Measure  Help needed turning from your back to your side while in a flat bed without using bedrails?: None Help needed moving from lying on your back to sitting on the side of a flat bed without using bedrails?: None Help needed moving to and from a bed to a chair (including a wheelchair)?: A Lot Help needed standing up from a chair using your arms (e.g., wheelchair or bedside chair)?: A Lot Help needed to walk in hospital room?: Total Help needed climbing 3-5 steps with a railing? : Total 6 Click Score: 14    End of Session Equipment Utilized During Treatment: Gait belt Activity Tolerance: Other (comment)(limited by  dizziness) Patient left: in bed;with call bell/phone within reach;with bed alarm set Nurse Communication: Mobility status PT Visit Diagnosis: Unsteadiness on feet (R26.81);Other abnormalities of gait and mobility (R26.89);Muscle weakness (generalized) (M62.81);History of falling (Z91.81);Difficulty in walking, not elsewhere classified (R26.2);Other symptoms and signs involving the nervous system (R29.898);Pain Pain - Right/Left: Right Pain - part of body: Ankle and joints of foot(bilateral LE and back )     Time: 1610-9604 PT Time Calculation (min) (ACUTE ONLY): 31 min  Charges:  $Therapeutic Exercise: 8-22 mins                    Benjiman Core, [place] Pager [phone] Acute Rehab    Allena Katz 11/18/2018, 2:57 PM

## 2018-11-19 LAB — BASIC METABOLIC PANEL
Anion gap: 9 (ref 5–15)
BUN: 25 mg/dL — ABNORMAL HIGH (ref 8–23)
CO2: 25 mmol/L (ref 22–32)
Calcium: 8.4 mg/dL — ABNORMAL LOW (ref 8.9–10.3)
Chloride: 101 mmol/L (ref 98–111)
Creatinine, Ser: 1.23 mg/dL (ref 0.61–1.24)
GFR calc Af Amer: >60 mL/min (ref >60)
GFR calc non Af Amer: >60 mL/min (ref >60)
Glucose, Bld: 509 mg/dL (ref 70–99)
Potassium: 5.1 mmol/L (ref 3.5–5.1)
Sodium: 135 mmol/L (ref 135–145)

## 2018-11-19 LAB — GLUCOSE, CAPILLARY
Glucose-Capillary: 420 mg/dL — ABNORMAL HIGH (ref 70–99)
Glucose-Capillary: 511 mg/dL (ref 70–99)
Glucose-Capillary: 186 mg/dL — ABNORMAL HIGH (ref 70–99)
Glucose-Capillary: 43 mg/dL — CL (ref 70–99)
Glucose-Capillary: 52 mg/dL — ABNORMAL LOW (ref 70–99)
Glucose-Capillary: 117 mg/dL — ABNORMAL HIGH (ref 70–99)

## 2018-11-19 MED ORDER — INSULIN ASPART PROT & ASPART (70-30 MIX) 100 UNIT/ML ~~LOC~~ SUSP: 15.0000 [IU] | Freq: Two times a day (BID) | SUBCUTANEOUS | Status: DC

## 2018-11-19 MED ORDER — GLUCERNA SHAKE PO LIQD
237.0000 mL | Freq: Three times a day (TID) | ORAL | Status: DC
  Administered 2018-11-19 – 2018-11-22 (×8): 237 mL via ORAL

## 2018-11-19 MED ORDER — INSULIN ASPART PROT & ASPART (70-30 MIX) 100 UNIT/ML ~~LOC~~ SUSP
15.0000 [IU] | Freq: Two times a day (BID) | SUBCUTANEOUS | Status: DC
  Administered 2018-11-19 – 2018-11-24 (×11): 15 [IU] via SUBCUTANEOUS
  Filled 2018-11-19: qty 10

## 2018-11-19 NOTE — Progress Notes (Signed)
PROGRESS NOTE    [name]  [name]:[id number] DOB: 02-23-1956 DOA: 11/11/2018 PCP: Patient, No Pcp Per   Brief Narrative: [name] is a 63 y.o. male with a past medical history significant for but not limited to depression, insulin-dependent diabetes mellitus, drug abuse, hepatitis C which is chronic, hypertension. Patient presented with DKA with initial concern for SIRS. Infectious etiology ruled out.   Assessment & Plan:   Principal Problem:   Adjustment disorder with mixed disturbance of emotions and conduct Active Problems:   AKI (acute kidney injury) ([place])   DKA (diabetic ketoacidoses) ([name]   AMS (altered mental status)   Sepsis ([place])   Hyperkalemia   DKA Diabetes mellitus, type 1 Evidence of acidemia on VBG with associated hyperglycemia and urine ketones. Associated elevated anion gap acidosis with bicarb of 13 and anion gap of 25 on admission. Patient initially managed with IV insulin and transitioned to Levemir. Currently poorly controlled. -Continue 70/30 15 units BID  Esophagitis Oral candidiasis Likely cause of dysphagia. Patient also with oral candidiasis. Started on diflucan and nystatin mouth wash. Also on Protonix. Biopsy obtained -Continue Lasix, Diflucan, nystatin and follow-up biopsy results; if no evidence of candidal esophagitis, will discontinue diflucan  AMS Initial concern for infectious cause, including meningitis. Imaging unrevealing. LP performed and not suggestive of meningitis. Cultures (including HSV) negative. AMS resolved. Likely related to DKA. Antiinfectives discontiued  AKI In setting of dehydration and bladder outlet obstruction. Improved with rehydration and foley placement. -Likely discontinue IV fluids in AM  Acute urinary retention Unknown etiology. Failed voiding trial. Will recommend urology follow-up outpatient. -Continue Flomax  Hyperkalemia Mild. -repeat BMP in AM  Anemia Normocytic. Stable.  SIRS Infectious source  ruled out. Likely secondary to DKA/dehydration.  Multiple rib fractures -Pain management  Suicidal ideation Seen by psychiatry. Started on Remeron. No inpatient psychiatry needs.  History of drug abuse No recent use per patient.    DVT prophylaxis: SCDs Code Status:   Code Status: Full Code Family Communication: None Disposition Plan: Discharge to SNF   Consultants:   Neurology  Psychiatry  Gastroenterology  Procedures:   LP  EGD Findings: Severe esophagitis with no bleeding was found 35 to 41 cm from the  incisors. This is circumferentia and the mucosa in this segment is  covered with thick white exudate. Biopsies were taken with a cold  forceps for histology. Two benign-appearing, intrinsic moderate (circumferential scarring or  stenosis; an endoscope may pass) stenoses were found 40 and 41 cm from  the incisors. The Z-line is very slightly irregular at 41 cm. The  narrowest stenosis measured 8 mm (inner diameter) x 1 cm (in length) and  was at 40 cm from the incisors. The stenoses were traversed with the  standard adult upper endoscope but required firm pressure to pass. After  passage of the endoscope multiple mucosal rents were present at the  proximal stricture with self-limited bleeding. Due to the dilation  performed by the endoscope, further dilation with balloon or bougie was  not performed today. The entire examined stomach was normal. A single small angioectasia without bleeding was found in the second  portion of the duodenum. The exam of the duodenum was otherwise normal. Impression: - Severe esophagitis. Biopsied. - Benign-appearing esophageal stenoses. Dilated  with adult upper endoscope (as described above). - Normal stomach. - A single  non-bleeding angioectasia in the  duodenum.  Antimicrobials:  Diflucan  Acycylovir  Ceftriaxone  Ampicillin  Vancomycin   Subjective: No issues this  morning. Significantly elevated blood sugar.  Objective: Vitals:   11/18/18 0446 11/18/18 1300 11/18/18 2019 11/19/18 0400  BP: (!) 150/75 (!) 122/53 (!) 102/56 (!) 146/64  Pulse: 75 74 81 69  Resp: 18 18 15 14   Temp: (!) 97.5 F (36.4 C) 97.7 F (36.5 C) 97.8 F (36.6 C) 97.8 F (36.6 C)  TempSrc: Oral Oral Oral Oral  SpO2: 95% 97% 96% 99%  Weight: 63.2 kg   64.2 kg  Height:        Intake/Output Summary (Last 24 hours) at 11/19/2018 0732 Last data filed at 11/19/2018 0500 Gross per 24 hour  Intake 4990.27 ml  Output 3000 ml  Net 1990.27 ml   Filed Weights   11/17/18 0552 11/18/18 0446 11/19/18 0400  Weight: 64.5 kg 63.2 kg 64.2 kg    Examination:  General exam: Appears calm and comfortable  Respiratory system: Clear to auscultation. Respiratory effort normal. Cardiovascular system: S1 & S2 heard, RRR. No murmurs, rubs, gallops or clicks. Gastrointestinal system: Abdomen is nondistended, soft and nontender. No organomegaly or masses felt. Normal bowel sounds heard. Central nervous system: Alert and oriented. No focal neurological deficits. Extremities: No edema. No calf tenderness Skin: No cyanosis. No rashes Psychiatry: Judgement and insight appear normal. Mood & affect appropriate.      Data Reviewed: I have personally reviewed following labs and imaging studies  CBC: Recent Labs  Lab 11/14/18 0353 11/15/18 0422 11/16/18 0414 11/17/18 0253 11/18/18 0526  WBC 8.2 7.2 8.0 8.2 9.4  NEUTROABS 4.5 3.7 3.8 4.3 5.1  HGB 8.9* 9.2* 8.9* 8.7* 8.7*  HCT 27.6* 28.7* 27.8* 27.5* 27.4*  MCV 84.1 85.9 85.8 85.4 87.5  PLT 266 268 283 302 694   Basic Metabolic Panel: Recent Labs  Lab 11/14/18 0353 11/15/18 0422 11/16/18 0414 11/17/18 0253 11/18/18 0526  NA 136 138 136 140 135   K 4.5 4.9 4.5 4.5 5.2*  CL 105 103 101 105 101  CO2 27 27 30 29 28   GLUCOSE 116* 78 112* 55* 278*  BUN 11 15 26* 23 23  CREATININE 0.92 1.07 1.23 1.10 1.15  CALCIUM 8.5* 8.8* 8.5* 8.5* 8.5*  MG 1.8 2.0 2.0 2.0 2.0  PHOS 2.5 2.6 2.6 3.5 2.8   GFR: Estimated Creatinine Clearance: 60.5 mL/min (by C-G formula based on SCr of 1.15 mg/dL). Liver Function Tests: Recent Labs  Lab 11/14/18 0353 11/15/18 0422 11/16/18 0414 11/17/18 0253 11/18/18 0526  AST 28 39 55* 32 31  ALT 20 25 36 29 27  ALKPHOS 110 126 162* 158* 177*  BILITOT 0.1* 0.5 0.3 0.2* 0.4  PROT 4.9* 5.4* 5.1* 5.1* 5.1*  ALBUMIN 1.8* 2.1* 1.9* 1.9* 1.8*   No results for input(s): LIPASE, AMYLASE in the last 168 hours. No results for input(s): AMMONIA in the last 168 hours. Coagulation Profile: No results for input(s): INR, PROTIME in the last 168 hours. Cardiac Enzymes: No results for input(s): CKTOTAL, CKMB, CKMBINDEX, TROPONINI in the last 168 hours. BNP (last 3 results) No results for input(s): PROBNP in the last 8760 hours. HbA1C: No results for input(s): HGBA1C in the last 72 hours. CBG: Recent Labs  Lab 11/18/18 0854 11/18/18 1238 11/18/18 1646 11/18/18 2017 11/19/18 0656  GLUCAP 389* 234* 265* 157* 420*   Lipid Profile: No results for input(s): CHOL, HDL, LDLCALC, TRIG, CHOLHDL, LDLDIRECT in the last 72 hours. Thyroid Function Tests: No results for input(s): TSH, T4TOTAL, FREET4, T3FREE, THYROIDAB in the last 72 hours. Anemia Panel: No results for input(s): VITAMINB12,  FOLATE, FERRITIN, TIBC, IRON, RETICCTPCT in the last 72 hours. Sepsis Labs: No results for input(s): PROCALCITON, LATICACIDVEN in the last 168 hours.  Recent Results (from the past 240 hour(s))  SARS Coronavirus 2 (CEPHEID - Performed in [facility] hospital lab), Hosp Order     Status: None   Collection Time: 11/11/18  1:18 AM   Specimen: Urine, Clean Catch; Nasopharyngeal  Result Value Ref Range Status   SARS Coronavirus 2  NEGATIVE NEGATIVE Final    Comment: (NOTE) If result is NEGATIVE SARS-CoV-2 target nucleic acids are NOT DETECTED. The SARS-CoV-2 RNA is generally detectable in upper and lower  respiratory specimens during the acute phase of infection. The lowest  concentration of SARS-CoV-2 viral copies this assay can detect is 250  copies / mL. A negative result does not preclude SARS-CoV-2 infection  and should not be used as the sole basis for treatment or other  patient management decisions.  A negative result may occur with  improper specimen collection / handling, submission of specimen other  than nasopharyngeal swab, presence of viral mutation(s) within the  areas targeted by this assay, and inadequate number of viral copies  (<250 copies / mL). A negative result must be combined with clinical  observations, patient history, and epidemiological information. If result is POSITIVE SARS-CoV-2 target nucleic acids are DETECTED. The SARS-CoV-2 RNA is generally detectable in upper and lower  respiratory specimens dur ing the acute phase of infection.  Positive  results are indicative of active infection with SARS-CoV-2.  Clinical  correlation with patient history and other diagnostic information is  necessary to determine patient infection status.  Positive results do  not rule out bacterial infection or co-infection with other viruses. If result is PRESUMPTIVE POSTIVE SARS-CoV-2 nucleic acids MAY BE PRESENT.   A presumptive positive result was obtained on the submitted specimen  and confirmed on repeat testing.  While 2019 novel coronavirus  (SARS-CoV-2) nucleic acids may be present in the submitted sample  additional confirmatory testing may be necessary for epidemiological  and / or clinical management purposes  to differentiate between  SARS-CoV-2 and other Sarbecovirus currently known to infect humans.  If clinically indicated additional testing with an alternate test  methodology [phone])  is advised. The SARS-CoV-2 RNA is generally  detectable in upper and lower respiratory sp ecimens during the acute  phase of infection. The expected result is Negative. Fact Sheet for Patients:  StrictlyIdeas.no Fact Sheet for Healthcare Providers: BankingDealers.co.za This test is not yet approved or cleared by the [place] FDA and has been authorized for detection and/or diagnosis of SARS-CoV-2 by FDA under an Emergency Use Authorization (EUA).  This EUA will remain in effect (meaning this test can be used) for the duration of the COVID-19 declaration under Section 564(b)(1) of the Act, 21 U.S.C. section 360bbb-3(b)(1), unless the authorization is terminated or revoked sooner. Performed at [facility], [place] [address] [place], [place] [address]   Urine culture     Status: Abnormal   Collection Time: 11/11/18  1:20 AM   Specimen: Urine, Catheterized  Result Value Ref Range Status   Specimen Description URINE, CATHETERIZED  Final   Special Requests   Final    NONE Performed at [facility], [place] [address], [place], [place] [address]    Culture MULTIPLE SPECIES PRESENT, SUGGEST RECOLLECTION (A)  Final   Report Status 11/12/2018 FINAL  Final  Hsv Culture And Typing     Status: None   Collection  Time: 11/11/18  5:13 AM   Specimen: Back; Cerebrospinal Fluid  Result Value Ref Range Status   HSV Culture/Type Comment  Final    Comment: (NOTE) Negative No Herpes simplex virus isolated. Performed At: [facility] [place], [place] [id number] Rush Farmer MD [id number]    Source of Sample CSF  Final    Comment: Performed at [facility], [place] [address] [place], [place] [address]  Blood Culture (routine x 2)     Status: None   Collection Time: 11/11/18  6:26 AM   Specimen: BLOOD  Result Value Ref Range Status   Specimen Description BLOOD LEFT ARM  Final   Special Requests   Final     BOTTLES DRAWN AEROBIC AND ANAEROBIC Blood Culture adequate volume   Culture   Final    NO GROWTH 5 DAYS Performed at [facility] [address] [place], [place] [address]    Report Status 11/16/2018 FINAL  Final  Blood Culture (routine x 2)     Status: None   Collection Time: 11/11/18  6:26 AM   Specimen: BLOOD  Result Value Ref Range Status   Specimen Description BLOOD LEFT ARM  Final   Special Requests   Final    BOTTLES DRAWN AEROBIC ONLY Blood Culture results may not be optimal due to an excessive volume of blood received in culture bottles   Culture   Final    NO GROWTH 5 DAYS Performed at [facility], [place] [address], [place], [place] [address]    Report Status 11/16/2018 FINAL  Final  CSF culture     Status: None   Collection Time: 11/11/18  7:17 AM   Specimen: CSF; Cerebrospinal Fluid  Result Value Ref Range Status   Specimen Description CSF  Final   Special Requests Immunocompromised  Final   Gram Stain   Final    CYTOSPIN SMEAR WBC PRESENT,BOTH PMN AND MONONUCLEAR NO ORGANISMS SEEN    Culture   Final    NO GROWTH 3 DAYS Performed at [facility], [place] [address] [place], [place] [address]    Report Status 11/14/2018 FINAL  Final  HSV 1/2 Ab IgG/IgM CSF     Status: None   Collection Time: 11/12/18  5:58 PM   Specimen: Cerebrospinal Fluid  Result Value Ref Range Status   HSV 1/2 Ab, IgM, CSF NOCSF IV Final    Comment: (NOTE) No Spinal Fluid received. Notified Estill Bamberg Ronsick 4:31pm 11/15/2018-[place]    HSV 1/2 Ab Screen IgG, CSF NOT PERFORMED  Final    Comment: (NOTE) Test not performed Performed At: [facility] [address] [place], [place] [id number] Esau Grew MD DD:[id number]   MRSA PCR Screening     Status: None   Collection Time: 11/12/18  9:19 PM   Specimen: Nasopharyngeal  Result Value Ref Range Status   MRSA by PCR NEGATIVE NEGATIVE Final    Comment:        The GeneXpert MRSA Assay (FDA approved for  NASAL specimens only), is one component of a comprehensive MRSA colonization surveillance program. It is not intended to diagnose MRSA infection nor to guide or monitor treatment for MRSA infections. Performed at [facility], [place] [address] [place], [place] [address]          Radiology Studies: No results found.      Scheduled Meds: . diclofenac sodium  2 g Topical QID  . feeding  supplement (ENSURE ENLIVE)  237 mL Oral BID BM  . fluconazole  100 mg Oral Daily  . folic acid  1 mg Oral Daily  . gabapentin  400 mg Oral TID  . insulin aspart  0-9 Units Subcutaneous TID WC  . insulin aspart protamine- aspart  15 Units Subcutaneous BID WC  . insulin detemir  3 Units Subcutaneous BID  . lidocaine  1 patch Transdermal Q24H  . mouth rinse  15 mL Mouth Rinse BID  . mirtazapine  15 mg Oral QHS  . multivitamin with minerals  1 tablet Oral Daily  . mupirocin cream   Topical BID  . nystatin  5 mL Oral QID  . pantoprazole  40 mg Oral BID  . polyethylene glycol  17 g Oral BID  . senna-docusate  1 tablet Oral BID  . tamsulosin  0.4 mg Oral Daily   Continuous Infusions: . sodium chloride 75 mL/hr at 11/19/18 0500     LOS: 8 days     Cordelia Poche, MD [name] 11/19/2018, 7:32 AM  If 7PM-7AM, please contact night-coverage [email or web address]

## 2018-11-19 NOTE — Progress Notes (Signed)
Nutrition Follow-up  INTERVENTION:   -D/c Ensure supplements -Provide Glucerna Shake po TID, each supplement provides 220 kcal and 10 grams of protein sh -continue Magic cup TID with meals, each supplement provides 290 kcal and 9 grams of protein  NUTRITION DIAGNOSIS:   Inadequate oral intake related to lethargy/confusion, acute illness(acute encephalopathy; viral meningitis) as evidenced by estimated needs, other (comment)(366mL po on [place] diet).  Improved.  GOAL:   Patient will meet greater than or equal to 90% of their needs  Progressing.  MONITOR:   PO intake, Labs, I & O's, Supplement acceptance, Weight trends, Diet advancement  ASSESSMENT:   63 year old male with medical history significant of depression, IDDM, drug abuse, chronic hep C, HTN, right great toe amputation s/p osteomyelitis, PAD presented to ED via EMS for evaluation of AMS and hyperglycemia. Patient found to be in DKA, with acute encephalopathy, bladder outlet obstruction,subacute rib fractures, hyperkalemia, and unclear etiology of sepsis; possibly viral meningitis  **RD working remotely**  Patient currently consuming 25-100% of meals at this time. Pt now on carbohydrate modified dysphagia 3 diet given increased CBGs. Patient not drinking Ensure supplements. Will switch to Glucerna shakes and keep Magic Cups for now.   Weights have remained stable since 7/31. Per I/Os: -4.8L since admit.  Medications: Folic acid tablet daily, Remeron tablet daily, Multivitamin with minerals daily Labs reviewed: CBGs: 420-511  Diet Order:   Diet Order            DIET DYS 3 Room service appropriate? Yes; Fluid consistency: Thin  Diet effective now              EDUCATION NEEDS:   Not appropriate for education at this time  Skin:  Skin Assessment: Skin Integrity Issues: Skin Integrity Issues:: Stage III, Other (Comment) Stage III: coccyx Other: non-pressure wound on right heel, head  Last BM:  8/4  Height:    Ht Readings from Last 1 Encounters:  11/17/18 6' (1.829 m)    Weight:   Wt Readings from Last 1 Encounters:  11/19/18 64.2 kg    Ideal Body Weight:  80.9 kg  BMI:  Body mass index is 19.2 kg/m.  Estimated Nutritional Needs:   Kcal:  7159-5396  Protein:  96-113  Fluid:  >1.9L   Clayton Bibles, MS, RD, LDN  Inpatient Clinical Dietitian Pager: [phone] After Hours Pager: [phone]

## 2018-11-19 NOTE — Progress Notes (Signed)
Hypoglycemic Event  CBG: 43 (21:16); 52(21:36)  Treatment: 8 oz juice/soda  Symptoms: Shaky  Follow-up CBG: Time:22:42 CBG Result:117  Possible Reasons for Event: Medication Regimen  Comments/MD notified:Dr Posey Pronto

## 2018-11-20 LAB — GLUCOSE, CAPILLARY
Glucose-Capillary: 347 mg/dL — ABNORMAL HIGH (ref 70–99)
Glucose-Capillary: 226 mg/dL — ABNORMAL HIGH (ref 70–99)
Glucose-Capillary: 241 mg/dL — ABNORMAL HIGH (ref 70–99)
Glucose-Capillary: 182 mg/dL — ABNORMAL HIGH (ref 70–99)
Glucose-Capillary: 86 mg/dL (ref 70–99)

## 2018-11-20 NOTE — Progress Notes (Signed)
PROGRESS NOTE    [name]  EPP:[id number] DOB: August 03, 1955 DOA: 11/11/2018 PCP: Patient, No Pcp Per   Brief Narrative: [name] is a 63 y.o. male with a past medical history significant for but not limited to depression, insulin-dependent diabetes mellitus, drug abuse, hepatitis C which is chronic, hypertension. Patient presented with DKA with initial concern for SIRS. Infectious etiology ruled out.   Assessment & Plan:   Principal Problem:   Adjustment disorder with mixed disturbance of emotions and conduct Active Problems:   AKI (acute kidney injury) ([facility])   DKA (diabetic ketoacidoses) ([name]   AMS (altered mental status)   Sepsis ([place])   Hyperkalemia   DKA Diabetes mellitus, type 1 Evidence of acidemia on VBG with associated hyperglycemia and urine ketones. Associated elevated anion gap acidosis with bicarb of 13 and anion gap of 25 on admission. Patient initially managed with IV insulin and transitioned to Levemir. Currently poorly controlled. Hypoglycemia yesterday evening. Very labile blood sugars -Continue 70/30 15 units BID -Discontinue SSI  Esophagitis Oral candidiasis Likely cause of dysphagia. Patient also with oral candidiasis. Started on diflucan and nystatin mouth wash. Also on Protonix. Biopsy obtained. No evidence of fungal infection. -Continue Lasix, nystatin and follow-up biopsy results -Discontinue Diflucan  AMS Initial concern for infectious cause, including meningitis. Imaging unrevealing. LP performed and not suggestive of meningitis. Cultures (including HSV) negative. AMS resolved. Likely related to DKA. Antiinfectives discontiued  AKI In setting of dehydration and bladder outlet obstruction. Improved with rehydration and foley placement.  Acute urinary retention Unknown etiology. Failed voiding trial. Will recommend urology follow-up outpatient. -Continue Flomax  Hyperkalemia Mild. -repeat BMP in AM  Anemia Normocytic. Stable.  SIRS  Infectious source ruled out. Likely secondary to DKA/dehydration.  Multiple rib fractures -Pain management  Suicidal ideation Seen by psychiatry. Started on Remeron. No inpatient psychiatry needs.  History of drug abuse No recent use per patient.    DVT prophylaxis: SCDs Code Status:   Code Status: Full Code Family Communication: None Disposition Plan: Discharge to SNF   Consultants:   Neurology  Psychiatry  Gastroenterology  Procedures:   LP  EGD Findings: Severe esophagitis with no bleeding was found 35 to 41 cm from the  incisors. This is circumferentia and the mucosa in this segment is  covered with thick white exudate. Biopsies were taken with a cold  forceps for histology. Two benign-appearing, intrinsic moderate (circumferential scarring or  stenosis; an endoscope may pass) stenoses were found 40 and 41 cm from  the incisors. The Z-line is very slightly irregular at 41 cm. The  narrowest stenosis measured 8 mm (inner diameter) x 1 cm (in length) and  was at 40 cm from the incisors. The stenoses were traversed with the  standard adult upper endoscope but required firm pressure to pass. After  passage of the endoscope multiple mucosal rents were present at the  proximal stricture with self-limited bleeding. Due to the dilation  performed by the endoscope, further dilation with balloon or bougie was  not performed today. The entire examined stomach was normal. A single small angioectasia without bleeding was found in the second  portion of the duodenum. The exam of the duodenum was otherwise normal. Impression: - Severe esophagitis. Biopsied. - Benign-appearing esophageal stenoses. Dilated  with adult upper endoscope (as described above). - Normal stomach.  - A single non-bleeding angioectasia in the  duodenum.  Antimicrobials:  Diflucan  Acycylovir  Ceftriaxone  Ampicillin  Vancomycin   Subjective: No concerns today  other than social issues he is dealing with at home.  Objective: Vitals:   11/19/18 1536 11/19/18 2114 11/20/18 0611 11/20/18 1322  BP: 131/61 127/64 131/66 115/68  Pulse: 73 79 80 77  Resp: 13 18 18    Temp: (!) 97.5 F (36.4 C) 99.4 F (37.4 C) 99.3 F (37.4 C) 98.7 F (37.1 C)  TempSrc: Oral Oral Oral Oral  SpO2: 100% 94% 96% 98%  Weight:   65.6 kg   Height:        Intake/Output Summary (Last 24 hours) at 11/20/2018 1725 Last data filed at 11/20/2018 1323 Gross per 24 hour  Intake 600 ml  Output 3150 ml  Net -2550 ml   Filed Weights   11/18/18 0446 11/19/18 0400 11/20/18 0611  Weight: 63.2 kg 64.2 kg 65.6 kg    Examination:  General exam: Appears calm and comfortable Respiratory system: Clear to auscultation. Respiratory effort normal. Cardiovascular system: S1 & S2 heard, RRR. No murmurs, rubs, gallops or clicks. Gastrointestinal system: Abdomen is nondistended, soft and nontender. No organomegaly or masses felt. Normal bowel sounds heard. Central nervous system: Alert and oriented. No focal neurological deficits. Extremities: No edema. No calf tenderness. Right large toe amputation Skin: No cyanosis. No rashes Psychiatry: Judgement and insight appear normal. Mood & affect appropriate.     Data Reviewed: I have personally reviewed following labs and imaging studies  CBC: Recent Labs  Lab 11/14/18 0353 11/15/18 0422 11/16/18 0414 11/17/18 0253 11/18/18 0526  WBC 8.2 7.2 8.0 8.2 9.4  NEUTROABS 4.5 3.7 3.8 4.3 5.1  HGB 8.9* 9.2* 8.9* 8.7* 8.7*  HCT 27.6* 28.7* 27.8* 27.5* 27.4*  MCV 84.1 85.9 85.8 85.4 87.5  PLT 266 268 283 302 812   Basic Metabolic Panel: Recent Labs  Lab 11/14/18 0353 11/15/18 0422 11/16/18 0414 11/17/18  0253 11/18/18 0526 11/19/18 0737  NA 136 138 136 140 135 135  K 4.5 4.9 4.5 4.5 5.2* 5.1  CL 105 103 101 105 101 101  CO2 27 27 30 29 28 25   GLUCOSE 116* 78 112* 55* 278* 509*  BUN 11 15 26* 23 23 25*  CREATININE 0.92 1.07 1.23 1.10 1.15 1.23  CALCIUM 8.5* 8.8* 8.5* 8.5* 8.5* 8.4*  MG 1.8 2.0 2.0 2.0 2.0  --   PHOS 2.5 2.6 2.6 3.5 2.8  --    GFR: Estimated Creatinine Clearance: 57.8 mL/min (by C-G formula based on SCr of 1.23 mg/dL). Liver Function Tests: Recent Labs  Lab 11/14/18 0353 11/15/18 0422 11/16/18 0414 11/17/18 0253 11/18/18 0526  AST 28 39 55* 32 31  ALT 20 25 36 29 27  ALKPHOS 110 126 162* 158* 177*  BILITOT 0.1* 0.5 0.3 0.2* 0.4  PROT 4.9* 5.4* 5.1* 5.1* 5.1*  ALBUMIN 1.8* 2.1* 1.9* 1.9* 1.8*   No results for input(s): LIPASE, AMYLASE in the last 168 hours. No results for input(s): AMMONIA in the last 168 hours. Coagulation Profile: No results for input(s): INR, PROTIME in the last 168 hours. Cardiac Enzymes: No results for input(s): CKTOTAL, CKMB, CKMBINDEX, TROPONINI in the last 168 hours. BNP (last 3 results) No results for input(s): PROBNP in the last 8760 hours. HbA1C: No results for input(s): HGBA1C in the last 72 hours. CBG: Recent Labs  Lab 11/19/18 2242 11/20/18 0752 11/20/18 1121 11/20/18 1348 11/20/18 1642  GLUCAP 117* 347* 226* 241* 182*   Lipid Profile: No results for input(s): CHOL, HDL, LDLCALC, TRIG, CHOLHDL, LDLDIRECT in the last 72 hours. Thyroid Function Tests: No results for  input(s): TSH, T4TOTAL, FREET4, T3FREE, THYROIDAB in the last 72 hours. Anemia Panel: No results for input(s): VITAMINB12, FOLATE, FERRITIN, TIBC, IRON, RETICCTPCT in the last 72 hours. Sepsis Labs: No results for input(s): PROCALCITON, LATICACIDVEN in the last 168 hours.  Recent Results (from the past 240 hour(s))  SARS Coronavirus 2 (CEPHEID - Performed in [facility] hospital lab), Hosp Order     Status: None   Collection Time: 11/11/18  1:18 AM    Specimen: Urine, Clean Catch; Nasopharyngeal  Result Value Ref Range Status   SARS Coronavirus 2 NEGATIVE NEGATIVE Final    Comment: (NOTE) If result is NEGATIVE SARS-CoV-2 target nucleic acids are NOT DETECTED. The SARS-CoV-2 RNA is generally detectable in upper and lower  respiratory specimens during the acute phase of infection. The lowest  concentration of SARS-CoV-2 viral copies this assay can detect is 250  copies / mL. A negative result does not preclude SARS-CoV-2 infection  and should not be used as the sole basis for treatment or other  patient management decisions.  A negative result may occur with  improper specimen collection / handling, submission of specimen other  than nasopharyngeal swab, presence of viral mutation(s) within the  areas targeted by this assay, and inadequate number of viral copies  (<250 copies / mL). A negative result must be combined with clinical  observations, patient history, and epidemiological information. If result is POSITIVE SARS-CoV-2 target nucleic acids are DETECTED. The SARS-CoV-2 RNA is generally detectable in upper and lower  respiratory specimens dur ing the acute phase of infection.  Positive  results are indicative of active infection with SARS-CoV-2.  Clinical  correlation with patient history and other diagnostic information is  necessary to determine patient infection status.  Positive results do  not rule out bacterial infection or co-infection with other viruses. If result is PRESUMPTIVE POSTIVE SARS-CoV-2 nucleic acids MAY BE PRESENT.   A presumptive positive result was obtained on the submitted specimen  and confirmed on repeat testing.  While 2019 novel coronavirus  (SARS-CoV-2) nucleic acids may be present in the submitted sample  additional confirmatory testing may be necessary for epidemiological  and / or clinical management purposes  to differentiate between  SARS-CoV-2 and other Sarbecovirus currently known to infect  humans.  If clinically indicated additional testing with an alternate test  methodology [phone]) is advised. The SARS-CoV-2 RNA is generally  detectable in upper and lower respiratory sp ecimens during the acute  phase of infection. The expected result is Negative. Fact Sheet for Patients:  StrictlyIdeas.no Fact Sheet for Healthcare Providers: BankingDealers.co.za This test is not yet approved or cleared by the [place] FDA and has been authorized for detection and/or diagnosis of SARS-CoV-2 by FDA under an Emergency Use Authorization (EUA).  This EUA will remain in effect (meaning this test can be used) for the duration of the COVID-19 declaration under Section 564(b)(1) of the Act, 21 U.S.C. section 360bbb-3(b)(1), unless the authorization is terminated or revoked sooner. Performed at [facility], [place] [address] [place], [place] [address]   Urine culture     Status: Abnormal   Collection Time: 11/11/18  1:20 AM   Specimen: Urine, Catheterized  Result Value Ref Range Status   Specimen Description URINE, CATHETERIZED  Final   Special Requests   Final    NONE Performed at [facility], [place] [address], [place], [place] [address]    Culture MULTIPLE SPECIES PRESENT, SUGGEST RECOLLECTION (A)  Final   Report Status  11/12/2018 FINAL  Final  Hsv Culture And Typing     Status: None   Collection Time: 11/11/18  5:13 AM   Specimen: Back; Cerebrospinal Fluid  Result Value Ref Range Status   HSV Culture/Type Comment  Final    Comment: (NOTE) Negative No Herpes simplex virus isolated. Performed At: [facility] [place], [place] [id number] Rush Farmer MD JK:[id number]    Source of Sample CSF  Final    Comment: Performed at [facility], [place] [address], [place], [place] [address]  Blood Culture (routine x 2)     Status: None   Collection Time: 11/11/18  6:26 AM   Specimen: BLOOD  Result  Value Ref Range Status   Specimen Description BLOOD LEFT ARM  Final   Special Requests   Final    BOTTLES DRAWN AEROBIC AND ANAEROBIC Blood Culture adequate volume   Culture   Final    NO GROWTH 5 DAYS Performed at [facility] [address] [place], [place] [address]    Report Status 11/16/2018 FINAL  Final  Blood Culture (routine x 2)     Status: None   Collection Time: 11/11/18  6:26 AM   Specimen: BLOOD  Result Value Ref Range Status   Specimen Description BLOOD LEFT ARM  Final   Special Requests   Final    BOTTLES DRAWN AEROBIC ONLY Blood Culture results may not be optimal due to an excessive volume of blood received in culture bottles   Culture   Final    NO GROWTH 5 DAYS Performed at [facility], [place] [address], [place], [place] [address]    Report Status 11/16/2018 FINAL  Final  CSF culture     Status: None   Collection Time: 11/11/18  7:17 AM   Specimen: CSF; Cerebrospinal Fluid  Result Value Ref Range Status   Specimen Description CSF  Final   Special Requests Immunocompromised  Final   Gram Stain   Final    CYTOSPIN SMEAR WBC PRESENT,BOTH PMN AND MONONUCLEAR NO ORGANISMS SEEN    Culture   Final    NO GROWTH 3 DAYS Performed at [facility], [place] [address], [place], [place] [address]    Report Status 11/14/2018 FINAL  Final  HSV 1/2 Ab IgG/IgM CSF     Status: None   Collection Time: 11/12/18  5:58 PM   Specimen: Cerebrospinal Fluid  Result Value Ref Range Status   HSV 1/2 Ab, IgM, CSF NOCSF IV Final    Comment: (NOTE) No Spinal Fluid received. Notified Estill Bamberg Ronsick 4:31pm 11/15/2018-[place]    HSV 1/2 Ab Screen IgG, CSF NOT PERFORMED  Final    Comment: (NOTE) Test not performed Performed At: [facility] [address] [place], [place] [id number] Esau Grew MD NT:[id number]   MRSA PCR Screening     Status: None   Collection Time: 11/12/18  9:19 PM   Specimen: Nasopharyngeal  Result Value Ref Range Status   MRSA  by PCR NEGATIVE NEGATIVE Final    Comment:        The GeneXpert MRSA Assay (FDA approved for NASAL specimens only), is one component of a comprehensive MRSA colonization surveillance program. It is not intended to diagnose MRSA infection nor to guide or monitor treatment for MRSA infections. Performed at [facility], [place] [address] [place], [place] [address]          Radiology Studies: No results found.  Scheduled Meds: . diclofenac sodium  2 g Topical QID  . feeding supplement (GLUCERNA SHAKE)  237 mL Oral TID BM  . fluconazole  100 mg Oral Daily  . folic acid  1 mg Oral Daily  . gabapentin  400 mg Oral TID  . insulin aspart protamine- aspart  15 Units Subcutaneous BID WC  . lidocaine  1 patch Transdermal Q24H  . mouth rinse  15 mL Mouth Rinse BID  . mirtazapine  15 mg Oral QHS  . multivitamin with minerals  1 tablet Oral Daily  . mupirocin cream   Topical BID  . nystatin  5 mL Oral QID  . pantoprazole  40 mg Oral BID  . polyethylene glycol  17 g Oral BID  . senna-docusate  1 tablet Oral BID  . tamsulosin  0.4 mg Oral Daily   Continuous Infusions:    LOS: 9 days     Cordelia Poche, MD [name] 11/20/2018, 5:25 PM  If 7PM-7AM, please contact night-coverage [email or web address]

## 2018-11-20 NOTE — TOC Initial Note (Signed)
Transition of Care [facility]) - Initial/Assessment Note    Patient Details  Name: [name] MRN: [id number] Date of Birth: 03/25/1956  Transition of Care [facility]) CM/SW Contact:    Gelene Mink, [place] Phone Number: 11/20/2018, 10:46 AM  Clinical Narrative:                  CSW met with the patient at bedside. The patient stated that he needed rehab. CSW shared that he did not have any [facility] SNF bed offers. CSW stated that he had a bed offer with [facility]. The patient stated that he would rather stay in [place]. CSW again stated that he did not have any offers in [place] or surrounding cities. The patient stated that he wanted to go to rehab. CSW asked since he did not want to go to [place] could he go home with home health. The patient stated no, he lives in a home with his son and grandchildren. He stated that he wanted to leave the residence due to his son selling drugs. CSW asked if he had a back up plan for his living arrangements. He stated no. CSW asked if he had applied for section 8, he stated he did a long time ago he didn't have the energy to go. CSW asked if he wanted to go to rehab in [place], the patient agreed and gave permission to start insurance authorization.   CSW started insurance authorization with Clarene Critchley at [employer]. CSW is awaiting response from [facility].   CSW will continue to follow.   Expected Discharge Plan: Skilled Nursing Facility Barriers to Discharge: Insurance Authorization   Patient Goals and CMS Choice Patient states their goals for this hospitalization and ongoing recovery are:: Pt agrees that he needs rehab CMS Medicare.gov Compare Post Acute Care list provided to:: Patient Choice offered to / list presented to : Patient  Expected Discharge Plan and Services Expected Discharge Plan: [place] In-house Referral: Clinical Social Work Discharge Planning Services: NA Post Acute Care Choice:  [place] Living arrangements for the past 2 months: [place] Expected Discharge Date: 11/15/18               DME Arranged: N/A DME Agency: NA       HH Arranged: NA [place] Agency: NA        Prior Living Arrangements/Services Living arrangements for the past 2 months: Single Family Home Lives with:: Adult Children, Relatives Patient language and need for interpreter reviewed:: No Do you feel safe going back to the place where you live?: No   Pt reports that he doesn't want to live with his son  Need for Family Participation in Patient Care: No (Comment) Care giver support system in place?: No (comment)   Criminal Activity/Legal Involvement Pertinent to Current Situation/Hospitalization: No - Comment as needed  Activities of Daily Living Home Assistive Devices/Equipment: Gilford Rile (specify type) ADL Screening (condition at time of admission) Patient's cognitive ability adequate to safely complete daily activities?: Yes Is the patient deaf or have difficulty hearing?: No Does the patient have difficulty seeing, even when wearing glasses/contacts?: No Does the patient have difficulty concentrating, remembering, or making decisions?: No Patient able to express need for assistance with ADLs?: Yes Does the patient have difficulty dressing or bathing?: No Independently performs ADLs?: No Communication: Independent Dressing (OT): Needs assistance Is this a change from baseline?: Change from baseline, expected to last <3days Grooming: Independent Feeding: Independent Bathing: Needs assistance Is this a  change from baseline?: Change from baseline, expected to last <3 days Toileting: Needs assistance Is this a change from baseline?: Change from baseline, expected to last >3days In/Out Bed: Needs assistance Is this a change from baseline?: Change from baseline, expected to last >3 days Walks in Home: Needs assistance Is this a change from baseline?: Change from  baseline, expected to last >3 days Does the patient have difficulty walking or climbing stairs?: Yes Weakness of Legs: Both Weakness of Arms/Hands: Both  Permission Sought/Granted Permission sought to share information with : Case Manager Permission granted to share information with : Yes, Verbal Permission Granted     Permission granted to share info w AGENCY: All SNF        Emotional Assessment Appearance:: Appears stated age Attitude/Demeanor/Rapport: Complaining Affect (typically observed): Agitated Orientation: : Oriented to Self, Oriented to Place, Oriented to  Time, Oriented to Situation Alcohol / Substance Use: Illicit Drugs Psych Involvement: No (comment)  Admission diagnosis:  Metabolic encephalopathy [M25.00] Hyperkalemia [E87.5] Hyponatremia [E87.1] Elevated LFTs [R94.5] Acute febrile illness [R50.9] AKI (acute kidney injury) ([place]) [N17.9] Diabetic ketoacidosis with coma associated with other specified diabetes mellitus ([place]) [E13.11] Patient Active Problem List   Diagnosis Date Noted  . Adjustment disorder with mixed disturbance of emotions and conduct   . Sepsis ([place]) 11/11/2018  . Hyperkalemia 11/11/2018  . Esophageal dysphagia 10/13/2018  . Acute esophagitis 10/13/2018  . Loose stools 10/13/2018  . Routine general medical examination at a health care facility 10/13/2018  . Left lower lobe pneumonia ([place]) 10/07/2018  . Acute respiratory failure ([place])   . AMS (altered mental status)   . Gastroesophageal reflux disease   . Hallucinations   . Hypoglycemia associated with diabetes ([place]) 08/12/2018  . At risk for adverse drug event 02/17/2018  . Abrasions of multiple sites   . Demand ischemia of myocardium ([place]) 02/05/2018  . Acute metabolic encephalopathy 37/07/8887  . DKA (diabetic ketoacidoses) ([place]) 02/03/2018  . Leukocytosis 02/03/2018  . Hypoglycemia 12/24/2017  . Syncope 12/24/2017  . Syncope and collapse 12/23/2017  . IV drug abuse ([place])  04/19/2017  . Acute encephalopathy 04/19/2017  . Hemangioma 10/02/2016  . Foreign body (FB) in soft tissue   . 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[place])   . Gangrene ([place])   . Type I (juvenile type) diabetes mellitus without mention of complication, not stated as uncontrolled   . Tobacco abuse   . Essential hypertension   . Diabetic infection of right foot ([place]) 07/22/2016  . Diabetic foot infection ([place]) 07/22/2016  . Chronic ulcer of heel, right, with fat layer exposed ([place]) 05/20/2016  . DKA, type 1 ([place]) 04/18/2016  . AKI (acute kidney injury) ([place]) 04/18/2016  . Hyponatremia 04/18/2016  . Severe protein-calorie malnutrition ([place]) 04/18/2016  . Elevated troponin 04/18/2016  . Poor dentition 07/12/2015  . Tobacco use 07/06/2013  . PAD (peripheral artery disease) ([place]) 04/29/2013  . COPD, mild ([place])   . 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 ([place]) 11/18/2011  . Diabetic neuropathy ([place]) 06/21/2010  . PROLIFERATIVE DIABETIC RETINOPATHY 06/21/2010  . SKIN TAG 01/30/2010  . Chronic hepatitis C ([place]) 11/22/2008  . VITILIGO 11/22/2008  . PROTEINURIA, MILD 11/22/2008  . Substance abuse ([place]) 04/23/2007  . DEPRESSION 11/11/2006   PCP:  Patient, No Pcp Per Pharmacy:   [facility] [address], [place] - [place] [address] [place] [place] [address] Phone: [phone] Fax: [phone]     Social Determinants of Health (SDOH) Interventions  Readmission Risk Interventions Readmission Risk Prevention Plan 08/12/2018  Transportation Screening Complete  Medication Review Press photographer) Complete  PCP or Specialist appointment within 3-5 days of discharge Complete  HRI or [place] Complete  SW Recovery Care/Counseling Consult Complete  [place] Not Applicable  Some recent data might be hidden

## 2018-11-20 NOTE — Progress Notes (Signed)
Physical Therapy Treatment Patient Details Name: [name] MRN: [id number] DOB: 04/22/1955 Today's Date: 11/20/2018    History of Present Illness 63 year old Caucasian male with a past medical history significant for but not limited to depression, insulin-dependent diabetes mellitus, drug abuse, hepatitis C which is chronic, hypertension, as well as other comorbidities who presented to the emergency room for evaluation for AMS as well as hyperglycemia. Admitted 11/11/18 for treatment of DKA and acute encephalopathy     PT Comments    Pt obviously irritated to work with therapy today, stating "Hurry up!, Let's get this over with." Pt with c/o of pain in bilateral LE R>L with weightbearing during ambulation. Pt is currently min Ax2 for bed mobility, transfers and ambulation of 15 feet with RW. D/c plan remains appropriate at this time.    Follow Up Recommendations  SNF     Equipment Recommendations  None recommended by PT       Precautions / Restrictions Precautions Precautions: Fall Restrictions Weight Bearing Restrictions: No    Mobility  Bed Mobility Overal bed mobility: Needs Assistance Bed Mobility: Supine to Sit;Sit to Supine     Supine to sit: Min guard;HOB elevated Sit to supine: Min guard;HOB elevated   General bed mobility comments: min guard for safety, HOB elevated  Transfers Overall transfer level: Needs assistance Equipment used: Rolling walker (2 wheeled) Transfers: Sit to/from Stand Sit to Stand: +2 physical assistance;Min assist         General transfer comment: minAx2 for power up to standing from elevated bed surface, min A for steadying  Ambulation/Gait Ambulation/Gait assistance: +2 safety/equipment;Min assist Gait Distance (Feet): 15 Feet Assistive device: Rolling walker (2 wheeled) Gait Pattern/deviations: Step-through pattern;Decreased step length - right;Decreased step length - left;Decreased stance time - right;Decreased stance time -  left;Antalgic Gait velocity: slowed Gait velocity interpretation: <1.31 ft/sec, indicative of household ambulator General Gait Details: minAx2 for maintaining balance with ambulation around foot of bed, as pt continually state "I'm going to fall." pt with steppage gait with stepping with increased force in order to feel floor due to decreased sensation       Balance Overall balance assessment: Needs assistance Sitting-balance support: Feet supported;No upper extremity supported Sitting balance-Leahy Scale: Fair     Standing balance support: Bilateral upper extremity supported;During functional activity;Single extremity supported Standing balance-Leahy Scale: Poor Standing balance comment: min a for balance in static standing                            Cognition Arousal/Alertness: Awake/alert Behavior During Therapy: Anxious Overall Cognitive Status: History of cognitive impairments - at baseline Area of Impairment: Safety/judgement;Awareness;Problem solving                         Safety/Judgement: Decreased awareness of safety Awareness: Emergent Problem Solving: Difficulty sequencing;Requires verbal cues;Requires tactile cues General Comments: continues to demonstrate decreased safety awareness, pt does not follow commands for stopping with gait when catheter line fell in front of him causing a trip hazard      Exercises Other Exercises Other Exercises: Bridge;strengthening;10 reps; Supine        Pertinent Vitals/Pain Pain Assessment: 0-10 Pain Score: 10-Worst pain ever Pain Location: back. foot, R foot, and bilateral LE  Pain Descriptors / Indicators: Grimacing;Discomfort;Constant Pain Intervention(s): Limited activity within patient's tolerance;Monitored during session;Repositioned           PT Goals (current goals can now be  found in the care plan section) Acute Rehab PT Goals Patient Stated Goal: go to rehab PT Goal Formulation: With  patient Time For Goal Achievement: 11/29/18 Potential to Achieve Goals: Fair Progress towards PT goals: Progressing toward goals    Frequency    Min 2X/week      PT Plan Current plan remains appropriate    Co-evaluation PT/OT/SLP Co-Evaluation/Treatment: Yes            AM-PAC PT "6 Clicks" Mobility   Outcome Measure  Help needed turning from your back to your side while in a flat bed without using bedrails?: None Help needed moving from lying on your back to sitting on the side of a flat bed without using bedrails?: None Help needed moving to and from a bed to a chair (including a wheelchair)?: A Lot Help needed standing up from a chair using your arms (e.g., wheelchair or bedside chair)?: A Lot Help needed to walk in hospital room?: Total Help needed climbing 3-5 steps with a railing? : Total 6 Click Score: 14    End of Session Equipment Utilized During Treatment: Gait belt Activity Tolerance: Patient limited by pain Patient left: in bed;with call bell/phone within reach;with bed alarm set Nurse Communication: Mobility status PT Visit Diagnosis: Unsteadiness on feet (R26.81);Other abnormalities of gait and mobility (R26.89);Muscle weakness (generalized) (M62.81);History of falling (Z91.81);Difficulty in walking, not elsewhere classified (R26.2);Other symptoms and signs involving the nervous system (R29.898);Pain Pain - Right/Left: Right Pain - part of body: Ankle and joints of foot(bilateral LE and back )     Time: 9892-1194 PT Time Calculation (min) (ACUTE ONLY): 15 min  Charges:  $Gait Training: 8-22 mins                     Sharanya Templin B. [name] PT, DPT Acute Rehabilitation Services Pager [phone] Office [phone]    [place] 11/20/2018, 3:00 PM

## 2018-11-21 LAB — BASIC METABOLIC PANEL
Anion gap: 8 (ref 5–15)
BUN: 25 mg/dL — ABNORMAL HIGH (ref 8–23)
CO2: 26 mmol/L (ref 22–32)
Calcium: 8.6 mg/dL — ABNORMAL LOW (ref 8.9–10.3)
Chloride: 99 mmol/L (ref 98–111)
Creatinine, Ser: 1.14 mg/dL (ref 0.61–1.24)
GFR calc Af Amer: >60 mL/min (ref >60)
GFR calc non Af Amer: >60 mL/min (ref >60)
Glucose, Bld: 415 mg/dL — ABNORMAL HIGH (ref 70–99)
Potassium: 5.7 mmol/L — ABNORMAL HIGH (ref 3.5–5.1)
Sodium: 133 mmol/L — ABNORMAL LOW (ref 135–145)

## 2018-11-21 LAB — GLUCOSE, CAPILLARY
Glucose-Capillary: 146 mg/dL — ABNORMAL HIGH (ref 70–99)
Glucose-Capillary: 409 mg/dL — ABNORMAL HIGH (ref 70–99)
Glucose-Capillary: 234 mg/dL — ABNORMAL HIGH (ref 70–99)
Glucose-Capillary: 301 mg/dL — ABNORMAL HIGH (ref 70–99)
Glucose-Capillary: 256 mg/dL — ABNORMAL HIGH (ref 70–99)

## 2018-11-21 NOTE — Plan of Care (Signed)
  Problem: Education: Goal: Knowledge of General Education information will improve Description: Including pain rating scale, medication(s)/side effects and non-pharmacologic comfort measures Outcome: Progressing   Problem: Clinical Measurements: Goal: Ability to maintain clinical measurements within normal limits will improve Outcome: Progressing   Problem: Clinical Measurements: Goal: Diagnostic test results will improve Outcome: Progressing   Problem: Clinical Measurements: Goal: Cardiovascular complication will be avoided Outcome: Progressing   Problem: Nutrition: Goal: Adequate nutrition will be maintained Outcome: Progressing   Problem: Elimination: Goal: Will not experience complications related to urinary retention Outcome: Progressing   Problem: Pain Managment: Goal: General experience of comfort will improve Outcome: Progressing   Problem: Activity: Goal: Risk for activity intolerance will decrease Outcome: Not Progressing   Problem: Safety: Goal: Ability to remain free from injury will improve Outcome: Not Progressing   Problem: Elimination: Goal: Will not experience complications related to bowel motility Outcome: Adequate for Discharge

## 2018-11-21 NOTE — Progress Notes (Signed)
PROGRESS NOTE    [name]  DEY:[id number] DOB: Jul 06, 1955 DOA: 11/11/2018 PCP: Patient, No Pcp Per   Brief Narrative: [name] is a 63 y.o. male with a past medical history significant for but not limited to depression, insulin-dependent diabetes mellitus, drug abuse, hepatitis C which is chronic, hypertension. Patient presented with DKA with initial concern for SIRS. Infectious etiology ruled out.   Assessment & Plan:   Principal Problem:   Adjustment disorder with mixed disturbance of emotions and conduct Active Problems:   AKI (acute kidney injury) ([place])   DKA (diabetic ketoacidoses) ([name]   AMS (altered mental status)   Sepsis ([place])   Hyperkalemia   DKA Diabetes mellitus, type 1 Evidence of acidemia on VBG with associated hyperglycemia and urine ketones. Associated elevated anion gap acidosis with bicarb of 13 and anion gap of 25 on admission. Patient initially managed with IV insulin and transitioned to Levemir. Currently poorly controlled. Hypoglycemia yesterday evening. Very labile blood sugars. Extremely elevated this morning but patient apparently had cereal at 3 AM -Continue 70/30 15 units BID  Esophagitis Oral candidiasis Likely cause of dysphagia. Patient also with oral candidiasis. Started on diflucan and nystatin mouth wash. Also on Protonix. Biopsy obtained. No evidence of fungal infection. -Continue Lasix, nystatin and follow-up biopsy results  AMS Initial concern for infectious cause, including meningitis. Imaging unrevealing. LP performed and not suggestive of meningitis. Cultures (including HSV) negative. AMS resolved. Likely related to DKA. Antiinfectives discontiued  AKI In setting of dehydration and bladder outlet obstruction. Improved with rehydration and foley placement.  Acute urinary retention Unknown etiology. Failed voiding trial. Will recommend urology follow-up outpatient. -Continue Flomax  Hyperkalemia Mild. -repeat BMP in AM  Anemia  Normocytic. Stable.  SIRS Infectious source ruled out. Likely secondary to DKA/dehydration.  Multiple rib fractures -Pain management  Suicidal ideation Seen by psychiatry. Started on Remeron. No inpatient psychiatry needs.  History of drug abuse No recent use per patient.    DVT prophylaxis: SCDs Code Status:   Code Status: Full Code Family Communication: None Disposition Plan: Discharge to SNF   Consultants:   Neurology  Psychiatry  Gastroenterology  Procedures:   LP  EGD Findings: Severe esophagitis with no bleeding was found 35 to 41 cm from the  incisors. This is circumferentia and the mucosa in this segment is  covered with thick white exudate. Biopsies were taken with a cold  forceps for histology. Two benign-appearing, intrinsic moderate (circumferential scarring or  stenosis; an endoscope may pass) stenoses were found 40 and 41 cm from  the incisors. The Z-line is very slightly irregular at 41 cm. The  narrowest stenosis measured 8 mm (inner diameter) x 1 cm (in length) and  was at 40 cm from the incisors. The stenoses were traversed with the  standard adult upper endoscope but required firm pressure to pass. After  passage of the endoscope multiple mucosal rents were present at the  proximal stricture with self-limited bleeding. Due to the dilation  performed by the endoscope, further dilation with balloon or bougie was  not performed today. The entire examined stomach was normal. A single small angioectasia without bleeding was found in the second  portion of the duodenum. The exam of the duodenum was otherwise normal. Impression: - Severe esophagitis. Biopsied. - Benign-appearing esophageal stenoses. Dilated  with adult upper endoscope (as described above). - Normal  stomach. - A single non-bleeding angioectasia in the  duodenum.  Antimicrobials:  Diflucan  Acycylovir  Ceftriaxone  Ampicillin  Vancomycin   Subjective: Mad about not getting his pain medication in time.  Objective: Vitals:   11/20/18 0611 11/20/18 1322 11/20/18 2122 11/21/18 0405  BP: 131/66 115/68 (!) 148/75 126/87  Pulse: 80 77 79 68  Resp: 18  18 17   Temp: 99.3 F (37.4 C) 98.7 F (37.1 C) 98.6 F (37 C) 97.9 F (36.6 C)  TempSrc: Oral Oral Oral Oral  SpO2: 96% 98% 95% 99%  Weight: 65.6 kg   62.6 kg  Height:        Intake/Output Summary (Last 24 hours) at 11/21/2018 1549 Last data filed at 11/21/2018 1250 Gross per 24 hour  Intake 1040 ml  Output 2900 ml  Net -1860 ml   Filed Weights   11/19/18 0400 11/20/18 0611 11/21/18 0405  Weight: 64.2 kg 65.6 kg 62.6 kg    Examination:  General exam: Appears calm and comfortable Respiratory system: Clear to auscultation. Respiratory effort normal. Cardiovascular system: S1 & S2 heard, RRR. No murmurs, rubs, gallops or clicks. Gastrointestinal system: Abdomen is nondistended, soft and nontender. No organomegaly or masses felt. Normal bowel sounds heard. Central nervous system: Alert and oriented. No focal neurological deficits. Extremities: No edema. No calf tenderness Skin: No cyanosis. No rashes Psychiatry: Judgement and insight appear normal. Mood & affect appropriate.     Data Reviewed: I have personally reviewed following labs and imaging studies  CBC: Recent Labs  Lab 11/15/18 0422 11/16/18 0414 11/17/18 0253 11/18/18 0526  WBC 7.2 8.0 8.2 9.4  NEUTROABS 3.7 3.8 4.3 5.1  HGB 9.2* 8.9* 8.7* 8.7*  HCT 28.7* 27.8* 27.5* 27.4*  MCV 85.9 85.8 85.4 87.5  PLT 268 283 302 008   Basic Metabolic Panel: Recent Labs  Lab 11/15/18 0422 11/16/18 0414 11/17/18 0253 11/18/18 0526 11/19/18 0737 11/21/18 0814  NA 138 136 140 135 135 133*  K 4.9 4.5  4.5 5.2* 5.1 5.7*  CL 103 101 105 101 101 99  CO2 27 30 29 28 25 26   GLUCOSE 78 112* 55* 278* 509* 415*  BUN 15 26* 23 23 25* 25*  CREATININE 1.07 1.23 1.10 1.15 1.23 1.14  CALCIUM 8.8* 8.5* 8.5* 8.5* 8.4* 8.6*  MG 2.0 2.0 2.0 2.0  --   --   PHOS 2.6 2.6 3.5 2.8  --   --    GFR: Estimated Creatinine Clearance: 59.5 mL/min (by C-G formula based on SCr of 1.14 mg/dL). Liver Function Tests: Recent Labs  Lab 11/15/18 0422 11/16/18 0414 11/17/18 0253 11/18/18 0526  AST 39 55* 32 31  ALT 25 36 29 27  ALKPHOS 126 162* 158* 177*  BILITOT 0.5 0.3 0.2* 0.4  PROT 5.4* 5.1* 5.1* 5.1*  ALBUMIN 2.1* 1.9* 1.9* 1.8*   No results for input(s): LIPASE, AMYLASE in the last 168 hours. No results for input(s): AMMONIA in the last 168 hours. Coagulation Profile: No results for input(s): INR, PROTIME in the last 168 hours. Cardiac Enzymes: No results for input(s): CKTOTAL, CKMB, CKMBINDEX, TROPONINI in the last 168 hours. BNP (last 3 results) No results for input(s): PROBNP in the last 8760 hours. HbA1C: No results for input(s): HGBA1C in the last 72 hours. CBG: Recent Labs  Lab 11/20/18 1642 11/20/18 2125 11/21/18 0311 11/21/18 0656 11/21/18 1137  GLUCAP 182* 86 146* 409* 234*   Lipid Profile: No results for input(s): CHOL, HDL, LDLCALC, TRIG, CHOLHDL, LDLDIRECT in the last 72 hours. Thyroid Function Tests: No results for input(s): TSH, T4TOTAL, FREET4, T3FREE, THYROIDAB in the last 72 hours. Anemia  Panel: No results for input(s): VITAMINB12, FOLATE, FERRITIN, TIBC, IRON, RETICCTPCT in the last 72 hours. Sepsis Labs: No results for input(s): PROCALCITON, LATICACIDVEN in the last 168 hours.  Recent Results (from the past 240 hour(s))  HSV 1/2 Ab IgG/IgM CSF     Status: None   Collection Time: 11/12/18  5:58 PM   Specimen: Cerebrospinal Fluid  Result Value Ref Range Status   HSV 1/2 Ab, IgM, CSF NOCSF IV Final    Comment: (NOTE) No Spinal Fluid received. Notified Estill Bamberg Ronsick  4:31pm 11/15/2018-[place]    HSV 1/2 Ab Screen IgG, CSF NOT PERFORMED  Final    Comment: (NOTE) Test not performed Performed At: [facility] [address] [place], [place] [id number] Esau Grew MD FW:[id number]   MRSA PCR Screening     Status: None   Collection Time: 11/12/18  9:19 PM   Specimen: Nasopharyngeal  Result Value Ref Range Status   MRSA by PCR NEGATIVE NEGATIVE Final    Comment:        The GeneXpert MRSA Assay (FDA approved for NASAL specimens only), is one component of a comprehensive MRSA colonization surveillance program. It is not intended to diagnose MRSA infection nor to guide or monitor treatment for MRSA infections. Performed at [facility], [place] [address], [place], [place] [address]          Radiology Studies: No results found.      Scheduled Meds: . diclofenac sodium  2 g Topical QID  . feeding supplement (GLUCERNA SHAKE)  237 mL Oral TID BM  . folic acid  1 mg Oral Daily  . gabapentin  400 mg Oral TID  . insulin aspart protamine- aspart  15 Units Subcutaneous BID WC  . lidocaine  1 patch Transdermal Q24H  . mouth rinse  15 mL Mouth Rinse BID  . mirtazapine  15 mg Oral QHS  . multivitamin with minerals  1 tablet Oral Daily  . mupirocin cream   Topical BID  . nystatin  5 mL Oral QID  . pantoprazole  40 mg Oral BID  . polyethylene glycol  17 g Oral BID  . senna-docusate  1 tablet Oral BID  . tamsulosin  0.4 mg Oral Daily   Continuous Infusions:    LOS: 10 days     Cordelia Poche, MD [name] 11/21/2018, 3:49 PM  If 7PM-7AM, please contact night-coverage [email or web address]

## 2018-11-22 LAB — BASIC METABOLIC PANEL
Anion gap: 8 (ref 5–15)
BUN: 30 mg/dL — ABNORMAL HIGH (ref 8–23)
CO2: 26 mmol/L (ref 22–32)
Calcium: 8.5 mg/dL — ABNORMAL LOW (ref 8.9–10.3)
Chloride: 102 mmol/L (ref 98–111)
Creatinine, Ser: 1.14 mg/dL (ref 0.61–1.24)
GFR calc Af Amer: >60 mL/min (ref >60)
GFR calc non Af Amer: >60 mL/min (ref >60)
Glucose, Bld: 230 mg/dL — ABNORMAL HIGH (ref 70–99)
Potassium: 5.2 mmol/L — ABNORMAL HIGH (ref 3.5–5.1)
Sodium: 136 mmol/L (ref 135–145)

## 2018-11-22 LAB — GLUCOSE, CAPILLARY
Glucose-Capillary: 145 mg/dL — ABNORMAL HIGH (ref 70–99)
Glucose-Capillary: 136 mg/dL — ABNORMAL HIGH (ref 70–99)
Glucose-Capillary: 218 mg/dL — ABNORMAL HIGH (ref 70–99)
Glucose-Capillary: 213 mg/dL — ABNORMAL HIGH (ref 70–99)

## 2018-11-22 MED ORDER — INSULIN ASPART 100 UNIT/ML ~~LOC~~ SOLN
5.0000 [IU] | Freq: Every day | SUBCUTANEOUS | Status: DC
  Administered 2018-11-22 – 2018-11-24 (×3): 5 [IU] via SUBCUTANEOUS

## 2018-11-22 NOTE — Progress Notes (Signed)
PROGRESS NOTE    [name]  WUX:[id number] DOB: [birth year] DOA: 11/11/2018 PCP: Patient, No Pcp Per   Brief Narrative: [name] is a 64 y.o. male with a past medical history significant for but not limited to depression, insulin-dependent diabetes mellitus, drug abuse, hepatitis C which is chronic, hypertension. Patient presented with DKA with initial concern for SIRS. Infectious etiology ruled out.   Assessment & Plan:   Principal Problem:   Adjustment disorder with mixed disturbance of emotions and conduct Active Problems:   AKI (acute kidney injury) ([place])   DKA (diabetic ketoacidoses) ([name]   AMS (altered mental status)   Sepsis ([place])   Hyperkalemia   DKA Diabetes mellitus, type 1 Evidence of acidemia on VBG with associated hyperglycemia and urine ketones. Associated elevated anion gap acidosis with bicarb of 13 and anion gap of 25 on admission. Patient initially managed with IV insulin and transitioned to Levemir. Currently poorly controlled. Hypoglycemia yesterday evening. Very labile blood sugars. Extremely elevated this morning but patient apparently had cereal at 3 AM -Continue 70/30 15 units BID -Add Novolog 5 units daily with lunch  Esophagitis Oral candidiasis Likely cause of dysphagia. Patient also with oral candidiasis. Started on diflucan and nystatin mouth wash. Also on Protonix. Biopsy obtained. No evidence of fungal infection. -Continue Lasix, nystatin  AMS Initial concern for infectious cause, including meningitis. Imaging unrevealing. LP performed and not suggestive of meningitis. Cultures (including HSV) negative. AMS resolved. Likely related to DKA. Antiinfectives discontiued  AKI In setting of dehydration and bladder outlet obstruction. Improved with rehydration and foley placement.  Acute urinary retention Unknown etiology. Failed voiding trial. Will recommend urology follow-up outpatient. -Continue Flomax  Hyperkalemia Mild. Improved. In  setting of recent hyperglycemia. Will likely equilibrate  Anemia Normocytic. Stable.  SIRS Infectious source ruled out. Likely secondary to DKA/dehydration. Resolved.  Multiple rib fractures -Pain management  Suicidal ideation Seen by psychiatry. Started on Remeron. No inpatient psychiatry needs.  History of drug abuse No recent use per patient.    DVT prophylaxis: SCDs Code Status:   Code Status: Full Code Family Communication: None Disposition Plan: Discharge to SNF. Medically stable.   Consultants:   Neurology  Psychiatry  Gastroenterology  Procedures:   LP  EGD Findings: Severe esophagitis with no bleeding was found 35 to 41 cm from the  incisors. This is circumferentia and the mucosa in this segment is  covered with thick white exudate. Biopsies were taken with a cold  forceps for histology. Two benign-appearing, intrinsic moderate (circumferential scarring or  stenosis; an endoscope may pass) stenoses were found 40 and 41 cm from  the incisors. The Z-line is very slightly irregular at 41 cm. The  narrowest stenosis measured 8 mm (inner diameter) x 1 cm (in length) and  was at 40 cm from the incisors. The stenoses were traversed with the  standard adult upper endoscope but required firm pressure to pass. After  passage of the endoscope multiple mucosal rents were present at the  proximal stricture with self-limited bleeding. Due to the dilation  performed by the endoscope, further dilation with balloon or bougie was  not performed today. The entire examined stomach was normal. A single small angioectasia without bleeding was found in the second  portion of the duodenum. The exam of the duodenum was otherwise normal. Impression: - Severe esophagitis. Biopsied. - Benign-appearing esophageal stenoses. Dilated   with adult upper endoscope (as described above). - Normal stomach. - A single non-bleeding angioectasia in the  duodenum.  Antimicrobials:  Diflucan  Acycylovir  Ceftriaxone  Ampicillin  Vancomycin   Subjective: Continues to voice concern about social issues with regard to son and getting his money  Objective: Vitals:   11/21/18 1658 11/21/18 2019 11/22/18 0558 11/22/18 1308  BP: 132/67 129/60 (!) 116/49 (!) 124/57  Pulse: 70 75 71 71  Resp:  16 18 18   Temp: 98.5 F (36.9 C) 98.7 F (37.1 C) 98.2 F (36.8 C) 98.5 F (36.9 C)  TempSrc: Oral Oral Oral Oral  SpO2: 97% 96% 96% 98%  Weight:   62.3 kg   Height:        Intake/Output Summary (Last 24 hours) at 11/22/2018 1323 Last data filed at 11/22/2018 1300 Gross per 24 hour  Intake 1477 ml  Output 1731 ml  Net -254 ml   Filed Weights   11/20/18 0611 11/21/18 0405 11/22/18 0558  Weight: 65.6 kg 62.6 kg 62.3 kg    Examination:  General exam: Appears calm and comfortable  Respiratory system: Clear to auscultation. Respiratory effort normal. Cardiovascular system: S1 & S2 heard, RRR. No murmurs, rubs, gallops or clicks. Gastrointestinal system: Abdomen is nondistended, soft and nontender. No organomegaly or masses felt. Normal bowel sounds heard. Central nervous system: Alert and oriented. No focal neurological deficits. Extremities: No edema. No calf tenderness Skin: No cyanosis. No rashes Psychiatry: Judgement and insight appear normal. Mood & affect appropriate.     Data Reviewed: I have personally reviewed following labs and imaging studies  CBC: Recent Labs  Lab 11/16/18 0414 11/17/18 0253 11/18/18 0526  WBC 8.0 8.2 9.4  NEUTROABS 3.8 4.3 5.1  HGB 8.9* 8.7* 8.7*  HCT 27.8* 27.5* 27.4*  MCV 85.8 85.4 87.5  PLT 283 302 237   Basic Metabolic Panel: Recent Labs  Lab 11/16/18 0414 11/17/18 0253  11/18/18 0526 11/19/18 0737 11/21/18 0814 11/22/18 1008  NA 136 140 135 135 133* 136  K 4.5 4.5 5.2* 5.1 5.7* 5.2*  CL 101 105 101 101 99 102  CO2 30 29 28 25 26 26   GLUCOSE 112* 55* 278* 509* 415* 230*  BUN 26* 23 23 25* 25* 30*  CREATININE 1.23 1.10 1.15 1.23 1.14 1.14  CALCIUM 8.5* 8.5* 8.5* 8.4* 8.6* 8.5*  MG 2.0 2.0 2.0  --   --   --   PHOS 2.6 3.5 2.8  --   --   --    GFR: Estimated Creatinine Clearance: 59.2 mL/min (by C-G formula based on SCr of 1.14 mg/dL). Liver Function Tests: Recent Labs  Lab 11/16/18 0414 11/17/18 0253 11/18/18 0526  AST 55* 32 31  ALT 36 29 27  ALKPHOS 162* 158* 177*  BILITOT 0.3 0.2* 0.4  PROT 5.1* 5.1* 5.1*  ALBUMIN 1.9* 1.9* 1.8*   No results for input(s): LIPASE, AMYLASE in the last 168 hours. No results for input(s): AMMONIA in the last 168 hours. Coagulation Profile: No results for input(s): INR, PROTIME in the last 168 hours. Cardiac Enzymes: No results for input(s): CKTOTAL, CKMB, CKMBINDEX, TROPONINI in the last 168 hours. BNP (last 3 results) No results for input(s): PROBNP in the last 8760 hours. HbA1C: No results for input(s): HGBA1C in the last 72 hours. CBG: Recent Labs  Lab 11/21/18 1137 11/21/18 1713 11/21/18 2232 11/22/18 0735 11/22/18 1056  GLUCAP 234* 301* 256* 145* 136*   Lipid Profile: No results for input(s): CHOL, HDL, LDLCALC, TRIG, CHOLHDL, LDLDIRECT in the last 72 hours. Thyroid Function Tests: No results for input(s): TSH, T4TOTAL, FREET4, T3FREE,  THYROIDAB in the last 72 hours. Anemia Panel: No results for input(s): VITAMINB12, FOLATE, FERRITIN, TIBC, IRON, RETICCTPCT in the last 72 hours. Sepsis Labs: No results for input(s): PROCALCITON, LATICACIDVEN in the last 168 hours.  Recent Results (from the past 240 hour(s))  HSV 1/2 Ab IgG/IgM CSF     Status: None   Collection Time: 11/12/18  5:58 PM   Specimen: Cerebrospinal Fluid  Result Value Ref Range Status   HSV 1/2 Ab, IgM, CSF NOCSF IV Final     Comment: (NOTE) No Spinal Fluid received. Notified Estill Bamberg Ronsick 4:31pm 11/15/2018-[place]    HSV 1/2 Ab Screen IgG, CSF NOT PERFORMED  Final    Comment: (NOTE) Test not performed Performed At: [facility] [address] [place], [place] [id number] Esau Grew MD [id number]   MRSA PCR Screening     Status: None   Collection Time: 11/12/18  9:19 PM   Specimen: Nasopharyngeal  Result Value Ref Range Status   MRSA by PCR NEGATIVE NEGATIVE Final    Comment:        The GeneXpert MRSA Assay (FDA approved for NASAL specimens only), is one component of a comprehensive MRSA colonization surveillance program. It is not intended to diagnose MRSA infection nor to guide or monitor treatment for MRSA infections. Performed at [facility], [place] [address], [place], [place] [address]          Radiology Studies: No results found.      Scheduled Meds: . diclofenac sodium  2 g Topical QID  . feeding supplement (GLUCERNA SHAKE)  237 mL Oral TID BM  . folic acid  1 mg Oral Daily  . gabapentin  400 mg Oral TID  . insulin aspart  5 Units Subcutaneous Q lunch  . insulin aspart protamine- aspart  15 Units Subcutaneous BID WC  . lidocaine  1 patch Transdermal Q24H  . mouth rinse  15 mL Mouth Rinse BID  . mirtazapine  15 mg Oral QHS  . multivitamin with minerals  1 tablet Oral Daily  . mupirocin cream   Topical BID  . nystatin  5 mL Oral QID  . pantoprazole  40 mg Oral BID  . polyethylene glycol  17 g Oral BID  . senna-docusate  1 tablet Oral BID  . tamsulosin  0.4 mg Oral Daily   Continuous Infusions:    LOS: 11 days     Cordelia Poche, MD [name] 11/22/2018, 1:23 PM  If 7PM-7AM, please contact night-coverage [email or web address]

## 2018-11-22 NOTE — TOC Progression Note (Signed)
Transition of Care [facility]) - Progression Note    Patient Details  Name: [name] MRN: [id number] Date of Birth: 24-Nov-1955  Transition of Care [facility] [place]) CM/SW [place], [place] Phone Number: [phone] 11/22/2018, 11:09 AM  Clinical Narrative:    CSW called [facility] in [place] today and yesterday and had to leave a messages. CSW is awaiting for a call back to ascertain if insurance authorizatiion was received.  CSW will continue to follow for discharge planning.  Expected Discharge Plan: Skilled Nursing Facility Barriers to Discharge: Insurance Authorization  Expected Discharge Plan and Services Expected Discharge Plan: [place] In-house Referral: Clinical Social Work Discharge Planning Services: NA Post Acute Care Choice: [place] Living arrangements for the past 2 months: Single Family Home Expected Discharge Date: 11/15/18               DME Arranged: N/A DME Agency: NA       HH Arranged: NA HH Agency: NA         Social Determinants of Health (SDOH) Interventions    Readmission Risk Interventions Readmission Risk Prevention Plan 08/12/2018  Transportation Screening Complete  Medication Review Press photographer) Complete  PCP or Specialist appointment within 3-5 days of discharge Complete  HRI or [place] Complete  SW Recovery Care/Counseling Consult Complete  [place] Not Applicable  Some recent data might be hidden

## 2018-11-23 LAB — BASIC METABOLIC PANEL
Anion gap: 8 (ref 5–15)
BUN: 30 mg/dL — ABNORMAL HIGH (ref 8–23)
CO2: 27 mmol/L (ref 22–32)
Calcium: 8.6 mg/dL — ABNORMAL LOW (ref 8.9–10.3)
Chloride: 102 mmol/L (ref 98–111)
Creatinine, Ser: 1.18 mg/dL (ref 0.61–1.24)
GFR calc Af Amer: >60 mL/min (ref >60)
GFR calc non Af Amer: >60 mL/min (ref >60)
Glucose, Bld: 76 mg/dL (ref 70–99)
Potassium: 4.8 mmol/L (ref 3.5–5.1)
Sodium: 137 mmol/L (ref 135–145)

## 2018-11-23 LAB — GLUCOSE, CAPILLARY
Glucose-Capillary: 396 mg/dL — ABNORMAL HIGH (ref 70–99)
Glucose-Capillary: 236 mg/dL — ABNORMAL HIGH (ref 70–99)
Glucose-Capillary: 73 mg/dL (ref 70–99)
Glucose-Capillary: 349 mg/dL — ABNORMAL HIGH (ref 70–99)

## 2018-11-23 LAB — ARBOVIRUS IGG, CSF
California Enceph IgG: <1:1 {titer}
Eastern eq encephalitis, IgG: <1:1 {titer}
St Louis encephalitis, IgG: <1:1 {titer}
West Nile IgG CSF: 1.4 IV — ABNORMAL HIGH (ref <1.30)
Western eq encephalitis, IgG: <1:1 {titer}

## 2018-11-23 NOTE — TOC Progression Note (Addendum)
Transition of Care [facility]) - Progression Note    Patient Details  Name: [name] MRN: [id number] Date of Birth: 1955-12-16  Transition of Care [facility]) CM/SW [place], [place] Phone Number: 11/23/2018, 12:59 PM  Clinical Narrative:    3:37pm- New COVID pending, auth received, pt able to transfer tomorrow if stable to [place].  12:59pm- CSW spoke with Abram Sander at [facility], continue to await [facility] authorization. Will need new COVID prior to d/c, will page MD.    Expected Discharge Plan: [place] Barriers to Discharge: Insurance Authorization  Expected Discharge Plan and Services Expected Discharge Plan: [place] In-house Referral: Clinical Social Work Discharge Planning Services: NA Post Acute Care Choice: [place] Living arrangements for the past 2 months: Single Family Home Expected Discharge Date: 11/15/18               DME Arranged: N/A DME Agency: NA       HH Arranged: NA HH Agency: NA         Social Determinants of Health (SDOH) Interventions    Readmission Risk Interventions Readmission Risk Prevention Plan 08/12/2018  Transportation Screening Complete  Medication Review Press photographer) Complete  PCP or Specialist appointment within 3-5 days of discharge Complete  HRI or [place] Complete  SW Recovery Care/Counseling Consult Complete  [place] Not Applicable  Some recent data might be hidden

## 2018-11-23 NOTE — Progress Notes (Signed)
Patient had called out twice for pain medication but when I went in to give it him he was snoring, continued to monitor and this time I went in, I had another nurse go in to verify that patient was sound asleep amd snoring, will continue to monitor. Patient shortly after attempted to get OOB on his own and the bed alarm went off. NT went in to check on patient who had soiled his bed. NT placed him in a chair while she changed his bed and while assisting back to bed, patient tripped over his foley catheter tubing. Patient assessment done, pain medication given and [facility] called to notify of fall. Patient refused to have his sons called at this time, will continue to monitor.

## 2018-11-23 NOTE — Progress Notes (Signed)
PROGRESS NOTE    [name]  WJX:[id number] DOB: Aug 17, 1955 DOA: 11/11/2018 PCP: Patient, No Pcp Per   Brief Narrative: [name] is a 63 y.o. male with a past medical history significant for but not limited to depression, insulin-dependent diabetes mellitus, drug abuse, hepatitis C which is chronic, hypertension. Patient presented with DKA with initial concern for SIRS. Infectious etiology ruled out.   Assessment & Plan:   Principal Problem:   Adjustment disorder with mixed disturbance of emotions and conduct Active Problems:   AKI (acute kidney injury) ([place])   DKA (diabetic ketoacidoses) ([name]   AMS (altered mental status)   Sepsis ([place])   Hyperkalemia   DKA Diabetes mellitus, type 1 Evidence of acidemia on VBG with associated hyperglycemia and urine ketones. Associated elevated anion gap acidosis with bicarb of 13 and anion gap of 25 on admission. Patient initially managed with IV insulin and transitioned to Levemir. Currently poorly controlled. Hypoglycemia yesterday evening. Very labile blood sugars. Patient is consistently adherent with regimen secondary to diet indiscretion. -Continue 70/30 15 units BID -Continue Novolog 5 units daily with lunch  Esophagitis Oral candidiasis Likely cause of dysphagia. Patient also with oral candidiasis. Started on diflucan and nystatin mouth wash. Also on Protonix. Biopsy obtained. No evidence of fungal infection. -Continue Lasix, nystatin  AMS Initial concern for infectious cause, including meningitis. Imaging unrevealing. LP performed and not suggestive of meningitis. Cultures (including HSV) negative. AMS resolved. Likely related to DKA. Antiinfectives discontiued  AKI In setting of dehydration and bladder outlet obstruction. Improved with rehydration and foley placement.  Acute urinary retention Unknown etiology. Failed voiding trial x1 -Continue Flomax -Attempt second voiding trial today; discussed with nurse  Hyperkalemia  Mild. Improved. In setting of recent hyperglycemia. Will likely equilibrate  Anemia Normocytic. Stable.  SIRS Infectious source ruled out. Likely secondary to DKA/dehydration. Resolved.  Multiple rib fractures -Pain management  Suicidal ideation Seen by psychiatry. Started on Remeron. No inpatient psychiatry needs.  History of drug abuse No recent use per patient.    DVT prophylaxis: SCDs Code Status:   Code Status: Full Code Family Communication: None Disposition Plan: Discharge to SNF. Medically stable.   Consultants:   Neurology  Psychiatry  Gastroenterology  Procedures:   LP  EGD Findings: Severe esophagitis with no bleeding was found 35 to 41 cm from the  incisors. This is circumferentia and the mucosa in this segment is  covered with thick white exudate. Biopsies were taken with a cold  forceps for histology. Two benign-appearing, intrinsic moderate (circumferential scarring or  stenosis; an endoscope may pass) stenoses were found 40 and 41 cm from  the incisors. The Z-line is very slightly irregular at 41 cm. The  narrowest stenosis measured 8 mm (inner diameter) x 1 cm (in length) and  was at 40 cm from the incisors. The stenoses were traversed with the  standard adult upper endoscope but required firm pressure to pass. After  passage of the endoscope multiple mucosal rents were present at the  proximal stricture with self-limited bleeding. Due to the dilation  performed by the endoscope, further dilation with balloon or bougie was  not performed today. The entire examined stomach was normal. A single small angioectasia without bleeding was found in the second  portion of the duodenum. The exam of the duodenum was otherwise normal. Impression: - Severe esophagitis. Biopsied. - Benign-appearing esophageal stenoses. Dilated   with adult upper endoscope (as described above). - Normal stomach. - A single non-bleeding angioectasia in  the  duodenum.  Antimicrobials:  Diflucan  Acycylovir  Ceftriaxone  Ampicillin  Vancomycin   Subjective: Concerned about his money. Had a fall overnight. No other issues.  Objective: Vitals:   11/22/18 2033 11/23/18 0350 11/23/18 0404 11/23/18 0420  BP: (!) 129/56 (!) 179/79  139/64  Pulse: 78 79    Resp:      Temp: 98.3 F (36.8 C) 98 F (36.7 C)    TempSrc: Oral Oral    SpO2: 95% 98%    Weight:   63.3 kg   Height:        Intake/Output Summary (Last 24 hours) at 11/23/2018 1223 Last data filed at 11/23/2018 0800 Gross per 24 hour  Intake 1580 ml  Output 2375 ml  Net -795 ml   Filed Weights   11/21/18 0405 11/22/18 0558 11/23/18 0404  Weight: 62.6 kg 62.3 kg 63.3 kg    Examination:  General exam: Appears calm and comfortable Respiratory system: Clear to auscultation. Respiratory effort normal. Cardiovascular system: S1 & S2 heard, RRR. No murmurs, rubs, gallops or clicks. Gastrointestinal system: Abdomen is nondistended, soft and nontender. No organomegaly or masses felt. Normal bowel sounds heard. Central nervous system: Alert and oriented. No focal neurological deficits. Extremities: No edema. No calf tenderness Skin: No cyanosis. No rashes Psychiatry: Judgement and insight appear normal. Mood & affect appropriate.     Data Reviewed: I have personally reviewed following labs and imaging studies  CBC: Recent Labs  Lab 11/17/18 0253 11/18/18 0526  WBC 8.2 9.4  NEUTROABS 4.3 5.1  HGB 8.7* 8.7*  HCT 27.5* 27.4*  MCV 85.4 87.5  PLT 302 948   Basic Metabolic Panel: Recent Labs  Lab 11/17/18 0253 11/18/18 0526 11/19/18 0737 11/21/18 0814 11/22/18 1008  NA 140 135 135 133* 136  K 4.5 5.2* 5.1 5.7* 5.2*  CL 105 101 101 99 102  CO2  29 28 25 26 26   GLUCOSE 55* 278* 509* 415* 230*  BUN 23 23 25* 25* 30*  CREATININE 1.10 1.15 1.23 1.14 1.14  CALCIUM 8.5* 8.5* 8.4* 8.6* 8.5*  MG 2.0 2.0  --   --   --   PHOS 3.5 2.8  --   --   --    GFR: Estimated Creatinine Clearance: 60.2 mL/min (by C-G formula based on SCr of 1.14 mg/dL). Liver Function Tests: Recent Labs  Lab 11/17/18 0253 11/18/18 0526  AST 32 31  ALT 29 27  ALKPHOS 158* 177*  BILITOT 0.2* 0.4  PROT 5.1* 5.1*  ALBUMIN 1.9* 1.8*   No results for input(s): LIPASE, AMYLASE in the last 168 hours. No results for input(s): AMMONIA in the last 168 hours. Coagulation Profile: No results for input(s): INR, PROTIME in the last 168 hours. Cardiac Enzymes: No results for input(s): CKTOTAL, CKMB, CKMBINDEX, TROPONINI in the last 168 hours. BNP (last 3 results) No results for input(s): PROBNP in the last 8760 hours. HbA1C: No results for input(s): HGBA1C in the last 72 hours. CBG: Recent Labs  Lab 11/22/18 1056 11/22/18 1643 11/22/18 2025 11/23/18 0759 11/23/18 1124  GLUCAP 136* 218* 213* 396* 236*   Lipid Profile: No results for input(s): CHOL, HDL, LDLCALC, TRIG, CHOLHDL, LDLDIRECT in the last 72 hours. Thyroid Function Tests: No results for input(s): TSH, T4TOTAL, FREET4, T3FREE, THYROIDAB in the last 72 hours. Anemia Panel: No results for input(s): VITAMINB12, FOLATE, FERRITIN, TIBC, IRON, RETICCTPCT in the last 72 hours. Sepsis Labs: No results for input(s): PROCALCITON, LATICACIDVEN in the last 168 hours.  No results found for this or any previous visit (from the past 240 hour(s)).       Radiology Studies: No results found.      Scheduled Meds: . diclofenac sodium  2 g Topical QID  . feeding supplement (GLUCERNA SHAKE)  237 mL Oral TID BM  . folic acid  1 mg Oral Daily  . gabapentin  400 mg Oral TID  . insulin aspart  5 Units Subcutaneous Q lunch  . insulin aspart protamine- aspart  15 Units Subcutaneous BID WC  . lidocaine  1  patch Transdermal Q24H  . mouth rinse  15 mL Mouth Rinse BID  . mirtazapine  15 mg Oral QHS  . multivitamin with minerals  1 tablet Oral Daily  . mupirocin cream   Topical BID  . nystatin  5 mL Oral QID  . pantoprazole  40 mg Oral BID  . polyethylene glycol  17 g Oral BID  . senna-docusate  1 tablet Oral BID  . tamsulosin  0.4 mg Oral Daily   Continuous Infusions:    LOS: 12 days     Cordelia Poche, MD [name] 11/23/2018, 12:23 PM  If 7PM-7AM, please contact night-coverage [email or web address]

## 2018-11-23 NOTE — Progress Notes (Signed)
Foley voiding trail started 1030am with foley removal. Patient bladder scan > 500 at 6 hours post removal. 8 hours post removal RN preformed in and out cath with 800cc out. Will past in report for RN to follow up during the night.   Saddie Benders RN BSN

## 2018-11-24 ENCOUNTER — Encounter: Payer: Self-pay | Admitting: Internal Medicine

## 2018-11-24 DIAGNOSIS — G629 Polyneuropathy, unspecified: Secondary | ICD-10-CM | POA: Diagnosis not present

## 2018-11-24 DIAGNOSIS — N179 Acute kidney failure, unspecified: Secondary | ICD-10-CM | POA: Diagnosis not present

## 2018-11-24 DIAGNOSIS — K5900 Constipation, unspecified: Secondary | ICD-10-CM | POA: Diagnosis not present

## 2018-11-24 DIAGNOSIS — F4325 Adjustment disorder with mixed disturbance of emotions and conduct: Secondary | ICD-10-CM | POA: Diagnosis not present

## 2018-11-24 DIAGNOSIS — Z20828 Contact with and (suspected) exposure to other viral communicable diseases: Secondary | ICD-10-CM | POA: Diagnosis not present

## 2018-11-24 DIAGNOSIS — R739 Hyperglycemia, unspecified: Secondary | ICD-10-CM | POA: Diagnosis not present

## 2018-11-24 DIAGNOSIS — Z7401 Bed confinement status: Secondary | ICD-10-CM | POA: Diagnosis not present

## 2018-11-24 DIAGNOSIS — J449 Chronic obstructive pulmonary disease, unspecified: Secondary | ICD-10-CM | POA: Diagnosis not present

## 2018-11-24 DIAGNOSIS — I959 Hypotension, unspecified: Secondary | ICD-10-CM | POA: Diagnosis not present

## 2018-11-24 DIAGNOSIS — R2689 Other abnormalities of gait and mobility: Secondary | ICD-10-CM | POA: Diagnosis not present

## 2018-11-24 DIAGNOSIS — E119 Type 2 diabetes mellitus without complications: Secondary | ICD-10-CM | POA: Diagnosis not present

## 2018-11-24 DIAGNOSIS — K219 Gastro-esophageal reflux disease without esophagitis: Secondary | ICD-10-CM | POA: Diagnosis not present

## 2018-11-24 DIAGNOSIS — F330 Major depressive disorder, recurrent, mild: Secondary | ICD-10-CM | POA: Diagnosis not present

## 2018-11-24 DIAGNOSIS — R112 Nausea with vomiting, unspecified: Secondary | ICD-10-CM | POA: Diagnosis not present

## 2018-11-24 DIAGNOSIS — E1165 Type 2 diabetes mellitus with hyperglycemia: Secondary | ICD-10-CM | POA: Diagnosis not present

## 2018-11-24 DIAGNOSIS — R05 Cough: Secondary | ICD-10-CM | POA: Diagnosis not present

## 2018-11-24 DIAGNOSIS — R52 Pain, unspecified: Secondary | ICD-10-CM | POA: Diagnosis not present

## 2018-11-24 DIAGNOSIS — F1910 Other psychoactive substance abuse, uncomplicated: Secondary | ICD-10-CM | POA: Diagnosis not present

## 2018-11-24 DIAGNOSIS — Z79899 Other long term (current) drug therapy: Secondary | ICD-10-CM | POA: Diagnosis not present

## 2018-11-24 DIAGNOSIS — G8918 Other acute postprocedural pain: Secondary | ICD-10-CM | POA: Diagnosis not present

## 2018-11-24 DIAGNOSIS — M6281 Muscle weakness (generalized): Secondary | ICD-10-CM | POA: Diagnosis not present

## 2018-11-24 DIAGNOSIS — E875 Hyperkalemia: Secondary | ICD-10-CM | POA: Diagnosis not present

## 2018-11-24 DIAGNOSIS — R4182 Altered mental status, unspecified: Secondary | ICD-10-CM | POA: Diagnosis not present

## 2018-11-24 DIAGNOSIS — M2550 Pain in unspecified joint: Secondary | ICD-10-CM | POA: Diagnosis not present

## 2018-11-24 DIAGNOSIS — B9721 SARS-associated coronavirus as the cause of diseases classified elsewhere: Secondary | ICD-10-CM | POA: Diagnosis not present

## 2018-11-24 DIAGNOSIS — E1010 Type 1 diabetes mellitus with ketoacidosis without coma: Secondary | ICD-10-CM | POA: Diagnosis not present

## 2018-11-24 DIAGNOSIS — R338 Other retention of urine: Secondary | ICD-10-CM | POA: Diagnosis not present

## 2018-11-24 DIAGNOSIS — N390 Urinary tract infection, site not specified: Secondary | ICD-10-CM | POA: Diagnosis not present

## 2018-11-24 DIAGNOSIS — A419 Sepsis, unspecified organism: Secondary | ICD-10-CM | POA: Diagnosis not present

## 2018-11-24 LAB — GLUCOSE, CAPILLARY
Glucose-Capillary: 109 mg/dL — ABNORMAL HIGH (ref 70–99)
Glucose-Capillary: 121 mg/dL — ABNORMAL HIGH (ref 70–99)
Glucose-Capillary: 230 mg/dL — ABNORMAL HIGH (ref 70–99)
Glucose-Capillary: 54 mg/dL — ABNORMAL LOW (ref 70–99)
Glucose-Capillary: 83 mg/dL (ref 70–99)

## 2018-11-24 LAB — SARS CORONAVIRUS 2 BY RT PCR (HOSPITAL ORDER, PERFORMED IN ~~LOC~~ HOSPITAL LAB): SARS Coronavirus 2: NEGATIVE

## 2018-11-24 MED ORDER — DICLOFENAC SODIUM 1 % TD GEL: 2.0000 g | Freq: Four times a day (QID) | TRANSDERMAL | Status: DC

## 2018-11-24 MED ORDER — ADULT MULTIVITAMIN W/MINERALS CH: 1.0000 | ORAL_TABLET | Freq: Every day | ORAL | Status: DC

## 2018-11-24 MED ORDER — POLYETHYLENE GLYCOL 3350 17 G PO PACK: 17.0000 g | PACK | Freq: Two times a day (BID) | ORAL | Status: DC

## 2018-11-24 MED ORDER — MIRTAZAPINE 15 MG PO TABS: 15.0000 mg | ORAL_TABLET | Freq: Every day | ORAL | Status: DC

## 2018-11-24 MED ORDER — INSULIN ASPART 100 UNIT/ML ~~LOC~~ SOLN: 5.0000 [IU] | Freq: Every day | SUBCUTANEOUS | Status: DC

## 2018-11-24 MED ORDER — TRAMADOL HCL 50 MG PO TABS: 50.0000 mg | ORAL_TABLET | Freq: Two times a day (BID) | ORAL | 0 refills | Status: DC | PRN

## 2018-11-24 MED ORDER — INSULIN ASPART PROT & ASPART (70-30 MIX) 100 UNIT/ML ~~LOC~~ SUSP: 15.0000 [IU] | Freq: Two times a day (BID) | SUBCUTANEOUS | 0 refills | Status: DC

## 2018-11-24 MED ORDER — SENNOSIDES-DOCUSATE SODIUM 8.6-50 MG PO TABS: 1.0000 | ORAL_TABLET | Freq: Two times a day (BID) | ORAL | Status: DC

## 2018-11-24 MED ORDER — GLUCERNA SHAKE PO LIQD: 237.0000 mL | Freq: Three times a day (TID) | ORAL | 0 refills | Status: DC

## 2018-11-24 MED ORDER — BELLADONNA ALKALOIDS-OPIUM 16.2-60 MG RE SUPP: 1.0000 | Freq: Three times a day (TID) | RECTAL | 0 refills | Status: DC | PRN

## 2018-11-24 NOTE — Plan of Care (Signed)
  Problem: Clinical Measurements: Goal: Respiratory complications will improve Outcome: Progressing Note: No s/s of respiratory complications noted. Goal: Cardiovascular complication will be avoided Outcome: Progressing Note: No s/s of cardiovascular complication noted.   

## 2018-11-24 NOTE — Progress Notes (Signed)
Physical Therapy Treatment Patient Details Name: [name] MRN: [id number] DOB: [birth year] Today's Date: 11/24/2018    History of Present Illness 63 year old Caucasian male with a past medical history significant for but not limited to depression, insulin-dependent diabetes mellitus, drug abuse, hepatitis C which is chronic, hypertension, as well as other comorbidities who presented to the emergency room for evaluation for AMS as well as hyperglycemia. Admitted 11/11/18 for treatment of DKA and acute encephalopathy     PT Comments    Pt is very anxious today especially about discharge and his family. Pt anxiety enhances his baseline self limitation. Pt is limited in safe mobility by decreased safety awareness and impulsivity in presence of bilateral LE pain and decreased strength and balance. Pt is min A for bed mobility, sit>stand to RW and ambulation of 16 feet before refusing further ambulation and requiring sitting in recliner to be wheeled back to bed. Pt then requires modA to transfer back to bed. D/c plans appropriate for this afternoon.     Follow Up Recommendations  SNF     Equipment Recommendations  None recommended by PT    Recommendations for Other Services       Precautions / Restrictions Precautions Precautions: Fall Restrictions Weight Bearing Restrictions: No    Mobility  Bed Mobility Overal bed mobility: Needs Assistance Bed Mobility: Supine to Sit;Sit to Supine     Supine to sit: Min guard;HOB elevated Sit to supine: Min guard;HOB elevated   General bed mobility comments: min guard for safety, HOB elevated  Transfers Overall transfer level: Needs assistance Equipment used: Rolling walker (2 wheeled) Transfers: Sit to/from [facility] Sit to Stand: Min assist Stand pivot transfers: Mod assist       General transfer comment: min A for power up and steadying, modA for guiding hips with stand pivot transfer back to  bed  Ambulation/Gait Ambulation/Gait assistance: Min assist;Mod assist Gait Distance (Feet): 16 Feet Assistive device: Rolling walker (2 wheeled) Gait Pattern/deviations: Step-through pattern;Decreased step length - right;Decreased step length - left;Decreased stance time - right;Decreased stance time - left;Antalgic Gait velocity: slowed   General Gait Details: min-modA for maintaining balance with gait, self limiting making it half way to the door and stating he can't go any further, despite max cuing pt then states he can't make it back to bed, requires placement of recliner behind him rolling him back to bed          Balance Overall balance assessment: Needs assistance Sitting-balance support: Feet supported;No upper extremity supported Sitting balance-Leahy Scale: Fair     Standing balance support: Bilateral upper extremity supported;During functional activity;Single extremity supported Standing balance-Leahy Scale: Poor Standing balance comment: min a for balance in static standing                            Cognition Arousal/Alertness: Awake/alert Behavior During Therapy: Anxious Overall Cognitive Status: History of cognitive impairments - at baseline Area of Impairment: Safety/judgement;Awareness;Problem solving                         Safety/Judgement: Decreased awareness of safety Awareness: Emergent Problem Solving: Difficulty sequencing;Requires verbal cues;Requires tactile cues General Comments: very anxious today, pt lost wallet in his bedding and became very agitated requiring maximal verbal cuing for remaining calm while searching for it.          General Comments General comments (skin integrity, edema, etc.): VSS  Pertinent Vitals/Pain Pain Assessment: 0-10 Pain Score: 9  Pain Location: back. foot, R foot, and bilateral LE  Pain Descriptors / Indicators: Grimacing;Discomfort;Constant Pain Intervention(s): Limited activity  within patient's tolerance;Monitored during session;Repositioned           PT Goals (current goals can now be found in the care plan section) Acute Rehab PT Goals Patient Stated Goal: go to rehab PT Goal Formulation: With patient Time For Goal Achievement: 11/29/18 Potential to Achieve Goals: Fair Progress towards PT goals: Not progressing toward goals - comment(very self limiting today, and axious about d/c)    Frequency    Min 2X/week      PT Plan Current plan remains appropriate       AM-PAC PT "6 Clicks" Mobility   Outcome Measure  Help needed turning from your back to your side while in a flat bed without using bedrails?: None Help needed moving from lying on your back to sitting on the side of a flat bed without using bedrails?: None Help needed moving to and from a bed to a chair (including a wheelchair)?: A Lot Help needed standing up from a chair using your arms (e.g., wheelchair or bedside chair)?: A Lot Help needed to walk in hospital room?: Total Help needed climbing 3-5 steps with a railing? : Total 6 Click Score: 14    End of Session Equipment Utilized During Treatment: Gait belt Activity Tolerance: Patient limited by pain Patient left: in bed;with call bell/phone within reach;with bed alarm set Nurse Communication: Mobility status PT Visit Diagnosis: Unsteadiness on feet (R26.81);Other abnormalities of gait and mobility (R26.89);Muscle weakness (generalized) (M62.81);History of falling (Z91.81);Difficulty in walking, not elsewhere classified (R26.2);Other symptoms and signs involving the nervous system (R29.898);Pain Pain - Right/Left: Right Pain - part of body: Ankle and joints of foot(bilateral LE and back )     Time: 1937-9024 PT Time Calculation (min) (ACUTE ONLY): 21 min  Charges:  $Gait Training: 8-22 mins                     [name] PT, DPT Acute Rehabilitation Services Pager [phone] Office [phone]    [place] 11/24/2018, 4:35 PM

## 2018-11-24 NOTE — Care Management Important Message (Signed)
Important Message  Patient Details  Name: [name] MRN: [id number] Date of Birth: 02-24-1956   Medicare Important Message Given:  Yes     Memory Argue 11/24/2018, 12:06 PM

## 2018-11-24 NOTE — Discharge Instructions (Signed)
[name],  You were in the hospital because of DKA. You will be going to skilled nursing. Please follow-up with urology for your urinary retention.

## 2018-11-24 NOTE — Progress Notes (Signed)
Patient's CBG upon recheck is 83.   Report called to receiving nurse at [facility] in [place]; all questions answered and call back number given.

## 2018-11-24 NOTE — TOC Transition Note (Signed)
Transition of Care [facility]) - CM/SW Discharge Note   Patient Details  Name: [name] MRN: [id number] Date of Birth: Mar 29, 1956  [facility] CM/SW Contact:  Vinie Sill, [place] Phone Number: 11/24/2018, 2:54 PM   Clinical Narrative:     Patient will DC to: [facility] at [place] Date: 11/24/2018 Family Notified: Patient states no family to contact - patient AX4  Transport By: Corey Harold  RN, patient, and facility notified of DC. Discharge Summary sent to facility. RN given number for report [phone], [place]. Ambulance transport requested for patient.   Clinical Social Worker signing off. Thurmond Butts, MSW, [facility] Clinical Social Worker [phone]    Final next level of care: Skilled Nursing Facility Barriers to Discharge: Insurance Authorization   Patient Goals and CMS Choice Patient states their goals for this hospitalization and ongoing recovery are:: Pt agrees that he needs rehab CMS Medicare.gov Compare Post Acute Care list provided to:: Patient Choice offered to / list presented to : Patient  Discharge Placement PASRR number recieved: 11/17/18            Patient chooses bed at: ([facility] in [place]) Patient to be transferred to facility by: [place] Name of family member notified: patient Ax 4- patient states no family to contact Patient and family notified of of transfer: 11/24/18  Discharge Plan and Services In-house Referral: Clinical Social Work Discharge Planning Services: NA Post Acute Care Choice: [place]          DME Arranged: N/A DME Agency: NA       HH Arranged: NA [place] Agency: NA        Social Determinants of Health ([place]) Interventions     Readmission Risk Interventions Readmission Risk Prevention Plan 08/12/2018  Transportation Screening Complete  Medication Review [employer]) Complete  PCP or Specialist appointment within 3-5 days of discharge Complete  HRI or [place]  Complete  SW Recovery Care/Counseling Consult Complete  [place] Not Applicable  Some recent data might be hidden

## 2018-11-24 NOTE — Discharge Summary (Signed)
Physician Discharge Summary  [name] TOI:[id number] DOB: [birth year] DOA: 11/11/2018  PCP: Patient, No Pcp Per  Admit date: 11/11/2018 Discharge date: 11/24/2018  Admitted From: Home Disposition: SNF  Recommendations for Outpatient Follow-up:  1. Follow up with PCP in 1 week 2. Follow up with urology in 1 week for voiding trial/urinary retention 3. Please obtain BMP/CBC in one week 4. Please follow up on the following pending results: None  Home Health: SNF Equipment/Devices: None  Discharge Condition: Stable CODE STATUS: Full code Diet recommendation: Heart healthy   Brief/Interim Summary:  Admission HPI written by Shela Leff, MD   HPI: [name] is a 63 y.o. male with medical history significant of depression, insulin-dependent diabetes mellitus, drug abuse, chronic hep C, hypertension presenting to the hospital via EMS for evaluation of altered mental status and hyperglycemia.  Patient received Ativan in the ED prior to his LP.  Currently very somnolent and no history could be obtained from him.  ED Course: Temperature 100.6 F.  Persistently tachycardic and tachypneic.  Significantly hypertensive on arrival with blood pressure 205/87. White count 13.1 with left shift.  Lactic acid 5.8.  Potassium 6.8.  Blood glucose 1401.  Bicarb 13, anion gap 25.  UA positive for ketones.  BUN 53, creatinine 1.8.  Baseline creatinine 0.9-1.1.  Alkaline phosphatase 186.  T bili 1.8.  AST and ALT normal.  Ethanol, acetaminophen, and salicylate levels normal.  Ammonia normal.  COVID-19 rapid test negative.  Initial VBG with pH 7.27, repeat VBG with pH 7.42.  UDS negative.  UA not suggestive of infection.  Urine culture pending.  Blood culture x2 pending.  Chest x-ray without evidence of active cardiopulmonary disease. Patient continued to be altered and agitated in the ED and required safety restraints.  He received Haldol 5 mg, NovoLog 10 units initially and then started on insulin  infusion, ceftriaxone, vancomycin, and 3 L IV fluid boluses.   Head CT pending. Lumbar puncture will be done by ED provider. Admission requested.   Hospital course:  DKA Diabetes mellitus, type 1 Evidence of acidemia on VBG with associated hyperglycemia and urine ketones. Associated elevated anion gap acidosis with bicarb of 13 and anion gap of 25 on admission. Patient initially managed with IV insulin and transitioned to Levemir. Transitioned to 70/30 15 units BID with addition of Novolog 5 units with lunch.  Esophagitis Oral candidiasis Likely cause of dysphagia. Patient also with oral candidiasis. Started on diflucan and nystatin mouth wash. Also on Protonix. Biopsy obtained. No evidence of fungal infection.  AMS Initial concern for infectious cause, including meningitis. Imaging unrevealing. LP performed and not suggestive of meningitis. Cultures (including HSV) negative. AMS resolved. Likely related to DKA. Antiinfectives discontiued  AKI In setting of dehydration and bladder outlet obstruction. Improved with rehydration and foley placement.  Acute urinary retention Unknown etiology. Failed voiding trial x2. Continue Flomax. Urology follow-up  Hyperkalemia Mild. Improved. In setting of recent hyperglycemia. Resolved.  Anemia Normocytic. Stable.  SIRS Infectious source ruled out. Likely secondary to DKA/dehydration. Resolved.  Multiple rib fractures Pain management  Suicidal ideation Seen by psychiatry. Started on Remeron. No inpatient psychiatry needs.  History of drug abuse No recent use per patient.  Discharge Diagnoses:  Principal Problem:   Adjustment disorder with mixed disturbance of emotions and conduct Active Problems:   AKI (acute kidney injury) ([place])   DKA (diabetic ketoacidoses) ([place])   AMS (altered mental status)   Sepsis ([place])   Hyperkalemia    Discharge  Instructions   Allergies as of 11/24/2018      Reactions   Codeine Itching    Tolerates hydrocodone/apap      Medication List    STOP taking these medications   amLODipine 10 MG tablet Commonly known as: NORVASC   bacitracin ointment   insulin aspart protamine - aspart (70-30) 100 UNIT/ML FlexPen Commonly known as: NOVOLOG 70/30 MIX Replaced by: insulin aspart protamine- aspart (70-30) 100 UNIT/ML injection   pregabalin 50 MG capsule Commonly known as: LYRICA   risperiDONE 0.5 MG tablet Commonly known as: RISPERDAL     TAKE these medications   Accu-Chek Aviva Plus w/Device Kit 1 each by Does not apply route 2 (two) times a day. Use to monitor glucose levels BID; E10.29   Accu-Chek Aviva Soln 1 each by In Vitro route as needed. Use to calibrate meter prn   Accu-Chek Softclix Lancets lancets Use to monitor glucose levels BID; E10.29   blood glucose meter kit and supplies Kit Dispense based on patient and insurance preference. Use up to four times daily as directed. (FOR ICD-9 250.00, 250.01).   diclofenac sodium 1 % Gel Commonly known as: VOLTAREN Apply 2 g topically 4 (four) times daily.   feeding supplement (GLUCERNA SHAKE) Liqd Take 237 mLs by mouth 3 (three) times daily between meals.   folic acid 1 MG tablet Commonly known as: FOLVITE Take 1 tablet (1 mg total) by mouth daily.   gabapentin 400 MG capsule Commonly known as: NEURONTIN Take 400 mg by mouth 3 (three) times daily.   glucose blood test strip Commonly known as: Accu-Chek Aviva Plus Use to monitor glucose levels BID; E10.29   insulin aspart 100 UNIT/ML injection Commonly known as: novoLOG Inject 5 Units into the skin daily with lunch. What changed:   how much to take  how to take this  when to take this  additional instructions   insulin aspart protamine- aspart (70-30) 100 UNIT/ML injection Commonly known as: NOVOLOG MIX 70/30 Inject 0.15 mLs (15 Units total) into the skin 2 (two) times daily with a meal. Replaces: insulin aspart protamine - aspart (70-30) 100  UNIT/ML FlexPen   Insulin Pen Needle 31G X 5 MM Misc Use with novolog flex pen   Isopropyl Alcohol Wipes 70 % Misc Apply 1 each topically daily as needed.   mirtazapine 15 MG tablet Commonly known as: REMERON Take 1 tablet (15 mg total) by mouth at bedtime.   multivitamin with minerals Tabs tablet Take 1 tablet by mouth daily. Start taking on: November 25, 2018   opium-belladonna 16.2-60 MG suppository Commonly known as: B&O SUPPRETTES Place 1 suppository rectally every 8 (eight) hours as needed for bladder spasms.   pantoprazole 40 MG tablet Commonly known as: PROTONIX Take 1 tablet (40 mg total) by mouth 2 (two) times daily.   polyethylene glycol 17 g packet Commonly known as: MIRALAX / GLYCOLAX Take 17 g by mouth 2 (two) times daily.   senna-docusate 8.6-50 MG tablet Commonly known as: Senokot-S Take 1 tablet by mouth 2 (two) times daily.   tamsulosin 0.4 MG Caps capsule Commonly known as: FLOMAX Take 1 capsule (0.4 mg total) by mouth daily after supper.   traMADol 50 MG tablet Commonly known as: ULTRAM Take 1 tablet (50 mg total) by mouth every 12 (twelve) hours as needed for moderate pain.      Follow-up Information    Alexis Frock, MD. Schedule an appointment as soon as possible for a visit in 1 week(s).  Specialty: Urology Why: Urinary retention Contact information: [address] [place] [address] [phone]          Allergies  Allergen Reactions  . Codeine Itching    Tolerates hydrocodone/apap    Consultations:  Psychiatry   Procedures/Studies: Ct Abdomen Pelvis Wo Contrast  Result Date: 11/11/2018 CLINICAL DATA:  Abdominal pain and swelling EXAM: CT ABDOMEN AND PELVIS WITHOUT CONTRAST TECHNIQUE: Multidetector CT imaging of the abdomen and pelvis was performed following the standard protocol without IV contrast. COMPARISON:  CT 02/02/2018 FINDINGS: Lower chest: Linear scarring versus atelectasis within the lung bases, left greater  than right. No pleural effusion. Coronary artery calcifications are noted. Hepatobiliary: 3 cm low-density lesion is again noted within the left hepatic lobe, previously characterized as a hepatic hemangioma. Liver has an otherwise unremarkable noncontrast appearance. No cholelithiasis is evident. No intrahepatic biliary dilatation. Pancreas: Grossly unremarkable noncontrast appearance without evidence of peripancreatic inflammatory changes. Spleen: Scattered punctate calcifications, likely sequela of chronic granulomatous disease. Adrenals/Urinary Tract: Adrenal glands unremarkable. There is marked distention of the urinary bladder and mild bilateral hydroureteronephrosis. 2 mm calculus within the midpole of the right kidney. No definite left-sided rib renal calculus, although there are multiple vascular calcifications within the left renal sinus. Stomach/Bowel: Stomach and bowel are grossly unremarkable. No dilated loops of bowel. No definite pericolonic inflammatory changes are identified. Appendix not clearly visualized. Vascular/Lymphatic: Extensive aortoiliac vascular calcifications. No lymphadenopathy identified within the abdomen or pelvis. Reproductive: Prostate gland appears grossly unremarkable. Other: No abdominal Whitner hernia or abnormality. No abdominopelvic ascites. Musculoskeletal: Subacute appearing fractures of the posterior left tenth, eleventh, and twelfth ribs. IMPRESSION: 1. Marked distention of the urinary bladder and mild bilateral hydroureteronephrosis. Findings raise the possibility for bladder outlet obstruction. Prostate gland does not appear enlarged. 2. There is a small 2 mm calculus within the right kidney. 3. Subacute fractures of the posterior tenth, eleventh, and twelfth ribs on the left. Electronically Signed   By: Davina Poke M.D.   On: 11/11/2018 12:36   Ct Head Wo Contrast  Result Date: 11/11/2018 CLINICAL DATA:  Altered mental status. Diabetes with hyperglycemia.  Altered level of consciousness. EXAM: CT HEAD WITHOUT CONTRAST TECHNIQUE: Contiguous axial images were obtained from the base of the skull through the vertex without intravenous contrast. COMPARISON:  CT head without contrast 08/23/2018 FINDINGS: Brain: Moderate diffuse atrophy and white matter disease is present. White matter changes are most evident about the frontal horns of the lateral ventricles bilaterally. There is no significant interval change. No acute infarct, hemorrhage, or mass lesion is present. No significant extraaxial fluid collection is present. Vascular: Atherosclerotic changes are present within the cavernous internal carotid arteries and at the dural margin of both vertebral arteries. There is no hyperdense vessel. Skull: Calvarium is intact. No focal lytic or blastic lesions are present. Sinuses/Orbits: The paranasal sinuses and mastoid air cells are clear. The globes and orbits are within normal limits. IMPRESSION: 1. Stable moderate diffuse atrophy and white matter disease with some part election for the frontal lobes. 2. No acute intracranial abnormality or significant interval change. 3. Atherosclerosis. Electronically Signed   By: San Morelle M.D.   On: 11/11/2018 06:21   US Renal  Result Date: 11/13/2018 CLINICAL DATA:  Acute kidney injury EXAM: RENAL / URINARY TRACT ULTRASOUND COMPLETE COMPARISON:  CT abdomen pelvis November 11, 2018 FINDINGS: Right Kidney: Renal measurements: 11.4 x 4.8 x 5.9 cm = volume: 168 mL . Echogenicity within normal limits. No mass or hydronephrosis visualized. Left  Kidney: Renal measurements: 10.9 x 6.2 x 6 cm = volume: 210 mL. Echogenicity within normal limits. No mass or hydronephrosis visualized. Bladder: Foley catheter is identified. Minimal bilateral perinephric fluid is noted. IMPRESSION: Minimal bilateral perinephric fluid noted. The exam is otherwise normal. Electronically Signed   By: Abelardo Diesel M.D.   On: 11/13/2018 17:25   Dg Chest Port  1 View  Result Date: 11/11/2018 CLINICAL DATA:  Initial evaluation for acute confusion. EXAM: PORTABLE CHEST 1 VIEW COMPARISON:  Prior radiograph from 10/03/2018 FINDINGS: Cardiac and mediastinal silhouettes are stable in size and contour, and remain within normal limits. Lungs are hypoinflated. No focal infiltrates. No pulmonary edema or definite pleural effusion, although the left costophrenic angle is incompletely visualized. No pneumothorax. No acute osseous finding. Few scattered remotely healed left-sided rib fractures noted. IMPRESSION: No radiographic evidence for active cardiopulmonary disease. Electronically Signed   By: Jeannine Boga M.D.   On: 11/11/2018 02:47   Dg Foot 2 Views Right  Result Date: 11/14/2018 CLINICAL DATA:  Chronic right second and third toe pain. No recent injury. EXAM: RIGHT FOOT - 2 VIEW COMPARISON:  Plain films right foot 08/12/2018. 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 No acute bony or joint abnormality is identified. Small linear metallic foreign body in the plantar soft tissues of the heel is unchanged. Extensive atherosclerosis noted. IMPRESSION: No change compared to the prior exam. 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 Small linear radiopaque foreign body in the soft tissues of the heel. Atherosclerosis. 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 Electronically Signed   By: Inge Rise M.D.   On: 11/14/2018 14:57   US Abdomen Limited Ruq  Result Date: 11/11/2018 CLINICAL DATA:  Elevated LFTs EXAM: ULTRASOUND ABDOMEN LIMITED RIGHT UPPER QUADRANT COMPARISON:  CT abdomen 02/02/2018 FINDINGS: Gallbladder: No gallstones or Whitter thickening visualized. No sonographic Murphy sign noted by sonographer. Common bile duct: Diameter: 2.7 mm Liver: 2.8 x 1.7 x 3.3 cm  heterogeneous hypoechoic mass in the left hepatic lobe unchanged compared with prior CT of the abdomen dated 02/02/2018 with characteristics of a benign hemangioma. Liver parenchyma is normal in echogenicity otherwise. Portal vein is patent on color Doppler imaging with normal direction of blood flow towards the liver. Other: Moderate right hydronephrosis. IMPRESSION: 1. No cholelithiasis or sonographic evidence of acute cholecystitis. 2. Moderate right hydronephrosis. Electronically Signed   By: Kathreen Devoid   On: 11/11/2018 08:57      Subjective: No issues overnight.  Discharge Exam: Vitals:   11/23/18 2154 11/24/18 0635  BP: 131/73 125/65  Pulse: 77 66  Resp:    Temp: 98.2 F (36.8 C) 97.7 F (36.5 C)  SpO2: 96% 97%   Vitals:   11/23/18 0420 11/23/18 1619 11/23/18 2154 11/24/18 0635  BP: 139/64 119/69 131/73 125/65  Pulse:  72 77 66  Resp:      Temp:  98.4 F (36.9 C) 98.2 F (36.8 C) 97.7 F (36.5 C)  TempSrc:  Oral Oral Oral  SpO2:  96% 96% 97%  Weight:    61.8 kg  Height:        General: Pt is alert, awake, not in acute distress Cardiovascular: RRR, S1/S2 +, no rubs, no gallops Respiratory: CTA bilaterally, no wheezing, no rhonchi Abdominal: Soft, NT, ND, bowel sounds +  Extremities: no edema, no cyanosis    The results of significant diagnostics from this hospitalization (including imaging, microbiology, ancillary and laboratory) are listed below for reference.     Microbiology: Recent Results (from the past 240 hour(s))  SARS Coronavirus 2 [facility] order, Performed in [place] hospital lab)     Status: None   Collection Time: 11/24/18  6:53 AM  Result Value Ref Range Status   SARS Coronavirus 2 NEGATIVE NEGATIVE Final    Comment: (NOTE) If result is NEGATIVE SARS-CoV-2 target nucleic acids are NOT DETECTED. The SARS-CoV-2 RNA is generally detectable in upper and lower  respiratory specimens during the acute phase of infection. The lowest   concentration of SARS-CoV-2 viral copies this assay can detect is 250  copies / mL. A negative result does not preclude SARS-CoV-2 infection  and should not be used as the sole basis for treatment or other  patient management decisions.  A negative result may occur with  improper specimen collection / handling, submission of specimen other  than nasopharyngeal swab, presence of viral mutation(s) within the  areas targeted by this assay, and inadequate number of viral copies  (<250 copies / mL). A negative result must be combined with clinical  observations, patient history, and epidemiological information. If result is POSITIVE SARS-CoV-2 target nucleic acids are DETECTED. The SARS-CoV-2 RNA is generally detectable in upper and lower  respiratory specimens dur ing the acute phase of infection.  Positive  results are indicative of active infection with SARS-CoV-2.  Clinical  correlation with patient history and other diagnostic information is  necessary to determine patient infection status.  Positive results do  not rule out bacterial infection or co-infection with other viruses. If result is PRESUMPTIVE POSTIVE SARS-CoV-2 nucleic acids MAY BE PRESENT.   A presumptive positive result was obtained on the submitted specimen  and confirmed on repeat testing.  While 2019 novel coronavirus  (SARS-CoV-2) nucleic acids may be present in the submitted sample  additional confirmatory testing may be necessary for epidemiological  and / or clinical management purposes  to differentiate between  SARS-CoV-2 and other Sarbecovirus currently known to infect humans.  If clinically indicated additional testing with an alternate test  methodology [phone]) is advised. The SARS-CoV-2 RNA is generally  detectable in upper and lower respiratory sp ecimens during the acute  phase of infection. The expected result is Negative. Fact Sheet for Patients:  StrictlyIdeas.no Fact Sheet  for Healthcare Providers: BankingDealers.co.za This test is not yet approved or cleared by the [place] FDA and has been authorized for detection and/or diagnosis of SARS-CoV-2 by FDA under an Emergency Use Authorization (EUA).  This EUA will remain in effect (meaning this test can be used) for the duration of the COVID-19 declaration under Section 564(b)(1) of the Act, 21 U.S.C. section 360bbb-3(b)(1), unless the authorization is terminated or revoked sooner. Performed at [facility], [place] [address], [place], [place] [address]      Labs: BNP (last 3 results) No results for input(s): BNP in the last 8760 hours. Basic Metabolic Panel: Recent Labs  Lab 11/18/18 0526 11/19/18 0737 11/21/18 0814 11/22/18 1008 11/23/18 1644  NA 135 135 133* 136 137  K 5.2* 5.1 5.7* 5.2* 4.8  CL 101 101 99 102 102  CO2 _0 GLUCOSE 278* 509* 415* 230* 76  BUN 23 25* 25* 30* 30*  CREATININE 1.15 1.23 1.14 1.14 1.18  CALCIUM 8.5* 8.4* 8.6* 8.5* 8.6*  MG 2.0  --   --   --   --  PHOS 2.8  --   --   --   --    Liver Function Tests: Recent Labs  Lab 11/18/18 0526  AST 31  ALT 27  ALKPHOS 177*  BILITOT 0.4  PROT 5.1*  ALBUMIN 1.8*   No results for input(s): LIPASE, AMYLASE in the last 168 hours. No results for input(s): AMMONIA in the last 168 hours. CBC: Recent Labs  Lab 11/18/18 0526  WBC 9.4  NEUTROABS 5.1  HGB 8.7*  HCT 27.4*  MCV 87.5  PLT 323   Cardiac Enzymes: No results for input(s): CKTOTAL, CKMB, CKMBINDEX, TROPONINI in the last 168 hours. BNP: Invalid input(s): POCBNP CBG: Recent Labs  Lab 11/23/18 1615 11/23/18 2156 11/24/18 0018 11/24/18 0809 11/24/18 1217  GLUCAP 73 349* 230* 121* 109*   D-Dimer No results for input(s): DDIMER in the last 72 hours. Hgb A1c No results for input(s): HGBA1C in the last 72 hours. Lipid Profile No results for input(s): CHOL, HDL, LDLCALC, TRIG, CHOLHDL, LDLDIRECT in the last 72  hours. Thyroid function studies No results for input(s): TSH, T4TOTAL, T3FREE, THYROIDAB in the last 72 hours.  Invalid input(s): FREET3 Anemia work up No results for input(s): VITAMINB12, FOLATE, FERRITIN, TIBC, IRON, RETICCTPCT in the last 72 hours. Urinalysis    Component Value Date/Time   COLORURINE COLORLESS (A) 11/11/2018 0120   APPEARANCEUR CLEAR 11/11/2018 0120   LABSPEC 1.021 11/11/2018 0120   PHURINE 6.0 11/11/2018 0120   GLUCOSEU >=500 (A) 11/11/2018 0120   GLUCOSEU 500 04/28/2012 1149   HGBUR NEGATIVE 11/11/2018 0120   BILIRUBINUR NEGATIVE 11/11/2018 0120   BILIRUBINUR neg 04/28/2012 1053   KETONESUR 20 (A) 11/11/2018 0120   PROTEINUR 30 (A) 11/11/2018 0120   UROBILINOGEN 4.0 (H) 11/16/2013 1646   NITRITE NEGATIVE 11/11/2018 0120   LEUKOCYTESUR NEGATIVE 11/11/2018 0120   Sepsis Labs Invalid input(s): PROCALCITONIN,  WBC,  LACTICIDVEN Microbiology Recent Results (from the past 240 hour(s))  SARS Coronavirus 2 [facility] [place] [facility] order, Performed in [place] hospital lab)     Status: None   Collection Time: 11/24/18  6:53 AM  Result Value Ref Range Status   SARS Coronavirus 2 NEGATIVE NEGATIVE Final    Comment: (NOTE) If result is NEGATIVE SARS-CoV-2 target nucleic acids are NOT DETECTED. The SARS-CoV-2 RNA is generally detectable in upper and lower  respiratory specimens during the acute phase of infection. The lowest  concentration of SARS-CoV-2 viral copies this assay can detect is 250  copies / mL. A negative result does not preclude SARS-CoV-2 infection  and should not be used as the sole basis for treatment or other  patient management decisions.  A negative result may occur with  improper specimen collection / handling, submission of specimen other  than nasopharyngeal swab, presence of viral mutation(s) within the  areas targeted by this assay, and inadequate number of viral copies  (<250 copies / mL). A negative result must be combined with clinical   observations, patient history, and epidemiological information. If result is POSITIVE SARS-CoV-2 target nucleic acids are DETECTED. The SARS-CoV-2 RNA is generally detectable in upper and lower  respiratory specimens dur ing the acute phase of infection.  Positive  results are indicative of active infection with SARS-CoV-2.  Clinical  correlation with patient history and other diagnostic information is  necessary to determine patient infection status.  Positive results do  not rule out bacterial infection or co-infection with other viruses. If result is PRESUMPTIVE POSTIVE SARS-CoV-2 nucleic acids MAY BE PRESENT.   A presumptive positive  result was obtained on the submitted specimen  and confirmed on repeat testing.  While 2019 novel coronavirus  (SARS-CoV-2) nucleic acids may be present in the submitted sample  additional confirmatory testing may be necessary for epidemiological  and / or clinical management purposes  to differentiate between  SARS-CoV-2 and other Sarbecovirus currently known to infect humans.  If clinically indicated additional testing with an alternate test  methodology [phone]) is advised. The SARS-CoV-2 RNA is generally  detectable in upper and lower respiratory sp ecimens during the acute  phase of infection. The expected result is Negative. Fact Sheet for Patients:  StrictlyIdeas.no Fact Sheet for Healthcare Providers: BankingDealers.co.za This test is not yet approved or cleared by the [place] FDA and has been authorized for detection and/or diagnosis of SARS-CoV-2 by FDA under an Emergency Use Authorization (EUA).  This EUA will remain in effect (meaning this test can be used) for the duration of the COVID-19 declaration under Section 564(b)(1) of the Act, 21 U.S.C. section 360bbb-3(b)(1), unless the authorization is terminated or revoked sooner. Performed at [facility], [place] [address],  [place], [place] [address]      Time coordinating discharge: 35 minutes  SIGNED:   Cordelia Poche, MD [name] 11/24/2018, 12:52 PM

## 2018-11-24 NOTE — Progress Notes (Signed)
[facility] arrived to pick up patient; while getting patient ready for transport, patient stated that he felt that he blood sugar was low. RN checked CBG; CBG 54. Patient given 4 oz orange juice. Patient also given graham crackers and milk per [facility] request to prevent CBG from dropping while patient is en route to [place].

## 2018-11-25 ENCOUNTER — Telehealth: Payer: Self-pay

## 2018-11-25 ENCOUNTER — Other Ambulatory Visit: Payer: Self-pay

## 2018-11-25 ENCOUNTER — Ambulatory Visit: Payer: Medicare HMO | Admitting: Family Medicine

## 2018-11-25 DIAGNOSIS — K209 Esophagitis, unspecified without bleeding: Secondary | ICD-10-CM

## 2018-11-25 DIAGNOSIS — K219 Gastro-esophageal reflux disease without esophagitis: Secondary | ICD-10-CM

## 2018-11-25 DIAGNOSIS — R1310 Dysphagia, unspecified: Secondary | ICD-10-CM

## 2018-11-25 DIAGNOSIS — R1319 Other dysphagia: Secondary | ICD-10-CM

## 2018-11-25 NOTE — Telephone Encounter (Signed)
Pt scheduled for 12/16/18- 8:30am @ [facility] for EGD with dil. Pt will be instructed at his office visit on tomorrow 11/26/2018.

## 2018-11-25 NOTE — Telephone Encounter (Signed)
-----   Message from Irving Copas., MD sent at 11/24/2018  4:37 PM EDT ----- Regarding: Follow-up Theta Leaf,I am supposed to see this patient later this week in clinic.He will need an endoscopy with dilation in the next few weeks. Let's start looking for a spot or take the September 2 spot for him as he will need Fluoroscopy for possible dilation. Thanks. GM

## 2018-11-26 ENCOUNTER — Ambulatory Visit: Payer: Medicare HMO | Admitting: Gastroenterology

## 2018-11-26 DIAGNOSIS — K5900 Constipation, unspecified: Secondary | ICD-10-CM | POA: Diagnosis not present

## 2018-11-26 DIAGNOSIS — R52 Pain, unspecified: Secondary | ICD-10-CM | POA: Diagnosis not present

## 2018-11-26 DIAGNOSIS — E119 Type 2 diabetes mellitus without complications: Secondary | ICD-10-CM | POA: Diagnosis not present

## 2018-11-26 DIAGNOSIS — G629 Polyneuropathy, unspecified: Secondary | ICD-10-CM | POA: Diagnosis not present

## 2018-11-26 NOTE — Telephone Encounter (Signed)
Cancelled procedure on 12/16/2018 @ [place]. Pt no showed office visit today. Will send letter asking pt to contact office.

## 2018-11-27 DIAGNOSIS — E119 Type 2 diabetes mellitus without complications: Secondary | ICD-10-CM | POA: Diagnosis not present

## 2018-11-27 DIAGNOSIS — K219 Gastro-esophageal reflux disease without esophagitis: Secondary | ICD-10-CM | POA: Diagnosis not present

## 2018-11-27 DIAGNOSIS — G629 Polyneuropathy, unspecified: Secondary | ICD-10-CM | POA: Diagnosis not present

## 2018-11-30 DIAGNOSIS — R338 Other retention of urine: Secondary | ICD-10-CM | POA: Diagnosis not present

## 2018-12-10 DIAGNOSIS — J449 Chronic obstructive pulmonary disease, unspecified: Secondary | ICD-10-CM | POA: Diagnosis not present

## 2018-12-10 DIAGNOSIS — F1910 Other psychoactive substance abuse, uncomplicated: Secondary | ICD-10-CM | POA: Diagnosis not present

## 2018-12-10 DIAGNOSIS — R2689 Other abnormalities of gait and mobility: Secondary | ICD-10-CM | POA: Diagnosis not present

## 2018-12-10 DIAGNOSIS — R4182 Altered mental status, unspecified: Secondary | ICD-10-CM | POA: Diagnosis not present

## 2018-12-10 DIAGNOSIS — E1010 Type 1 diabetes mellitus with ketoacidosis without coma: Secondary | ICD-10-CM | POA: Diagnosis not present

## 2018-12-10 DIAGNOSIS — A419 Sepsis, unspecified organism: Secondary | ICD-10-CM | POA: Diagnosis not present

## 2018-12-10 DIAGNOSIS — M6281 Muscle weakness (generalized): Secondary | ICD-10-CM | POA: Diagnosis not present

## 2018-12-12 ENCOUNTER — Other Ambulatory Visit (HOSPITAL_COMMUNITY): Payer: Medicare HMO

## 2018-12-16 ENCOUNTER — Encounter (HOSPITAL_COMMUNITY): Payer: Self-pay

## 2018-12-16 ENCOUNTER — Ambulatory Visit (HOSPITAL_COMMUNITY): Admit: 2018-12-16 | Payer: Medicare HMO | Admitting: Gastroenterology

## 2018-12-16 DIAGNOSIS — F330 Major depressive disorder, recurrent, mild: Secondary | ICD-10-CM | POA: Diagnosis not present

## 2018-12-16 DIAGNOSIS — F4325 Adjustment disorder with mixed disturbance of emotions and conduct: Secondary | ICD-10-CM | POA: Diagnosis not present

## 2018-12-16 SURGERY — ESOPHAGOGASTRODUODENOSCOPY (EGD) WITH PROPOFOL
Anesthesia: Monitor Anesthesia Care

## 2018-12-31 DIAGNOSIS — K219 Gastro-esophageal reflux disease without esophagitis: Secondary | ICD-10-CM | POA: Diagnosis not present

## 2018-12-31 DIAGNOSIS — E119 Type 2 diabetes mellitus without complications: Secondary | ICD-10-CM | POA: Diagnosis not present

## 2018-12-31 DIAGNOSIS — G629 Polyneuropathy, unspecified: Secondary | ICD-10-CM | POA: Diagnosis not present

## 2018-12-31 DIAGNOSIS — R339 Retention of urine, unspecified: Secondary | ICD-10-CM | POA: Diagnosis not present

## 2019-01-01 DIAGNOSIS — I10 Essential (primary) hypertension: Secondary | ICD-10-CM | POA: Diagnosis not present

## 2019-01-01 DIAGNOSIS — R7989 Other specified abnormal findings of blood chemistry: Secondary | ICD-10-CM | POA: Diagnosis not present

## 2019-01-21 DIAGNOSIS — N401 Enlarged prostate with lower urinary tract symptoms: Secondary | ICD-10-CM | POA: Diagnosis not present

## 2019-01-21 DIAGNOSIS — E119 Type 2 diabetes mellitus without complications: Secondary | ICD-10-CM | POA: Diagnosis not present

## 2019-01-21 DIAGNOSIS — N319 Neuromuscular dysfunction of bladder, unspecified: Secondary | ICD-10-CM | POA: Diagnosis not present

## 2019-01-21 DIAGNOSIS — N138 Other obstructive and reflux uropathy: Secondary | ICD-10-CM | POA: Diagnosis not present

## 2019-01-21 DIAGNOSIS — K5900 Constipation, unspecified: Secondary | ICD-10-CM | POA: Diagnosis not present

## 2019-01-21 DIAGNOSIS — G629 Polyneuropathy, unspecified: Secondary | ICD-10-CM | POA: Diagnosis not present

## 2019-01-21 DIAGNOSIS — E1065 Type 1 diabetes mellitus with hyperglycemia: Secondary | ICD-10-CM | POA: Diagnosis not present

## 2019-01-21 DIAGNOSIS — F329 Major depressive disorder, single episode, unspecified: Secondary | ICD-10-CM | POA: Diagnosis not present

## 2019-01-21 DIAGNOSIS — R339 Retention of urine, unspecified: Secondary | ICD-10-CM | POA: Diagnosis not present

## 2019-01-25 DIAGNOSIS — R300 Dysuria: Secondary | ICD-10-CM | POA: Diagnosis not present

## 2019-01-28 DIAGNOSIS — R739 Hyperglycemia, unspecified: Secondary | ICD-10-CM | POA: Diagnosis not present

## 2019-01-28 DIAGNOSIS — K219 Gastro-esophageal reflux disease without esophagitis: Secondary | ICD-10-CM | POA: Diagnosis not present

## 2019-01-28 DIAGNOSIS — E119 Type 2 diabetes mellitus without complications: Secondary | ICD-10-CM | POA: Diagnosis not present

## 2019-01-28 DIAGNOSIS — G629 Polyneuropathy, unspecified: Secondary | ICD-10-CM | POA: Diagnosis not present

## 2019-02-03 DIAGNOSIS — E0865 Diabetes mellitus due to underlying condition with hyperglycemia: Secondary | ICD-10-CM | POA: Diagnosis not present

## 2019-02-09 DIAGNOSIS — I10 Essential (primary) hypertension: Secondary | ICD-10-CM | POA: Diagnosis not present

## 2019-02-09 DIAGNOSIS — R2689 Other abnormalities of gait and mobility: Secondary | ICD-10-CM | POA: Diagnosis not present

## 2019-02-09 DIAGNOSIS — J449 Chronic obstructive pulmonary disease, unspecified: Secondary | ICD-10-CM | POA: Diagnosis not present

## 2019-02-09 DIAGNOSIS — M6281 Muscle weakness (generalized): Secondary | ICD-10-CM | POA: Diagnosis not present

## 2019-02-10 DIAGNOSIS — I10 Essential (primary) hypertension: Secondary | ICD-10-CM | POA: Diagnosis not present

## 2019-02-10 DIAGNOSIS — J449 Chronic obstructive pulmonary disease, unspecified: Secondary | ICD-10-CM | POA: Diagnosis not present

## 2019-02-10 DIAGNOSIS — N319 Neuromuscular dysfunction of bladder, unspecified: Secondary | ICD-10-CM | POA: Diagnosis not present

## 2019-02-10 DIAGNOSIS — R739 Hyperglycemia, unspecified: Secondary | ICD-10-CM | POA: Diagnosis not present

## 2019-02-10 DIAGNOSIS — E119 Type 2 diabetes mellitus without complications: Secondary | ICD-10-CM | POA: Diagnosis not present

## 2019-02-10 DIAGNOSIS — M6281 Muscle weakness (generalized): Secondary | ICD-10-CM | POA: Diagnosis not present

## 2019-02-10 DIAGNOSIS — E1065 Type 1 diabetes mellitus with hyperglycemia: Secondary | ICD-10-CM | POA: Diagnosis not present

## 2019-02-10 DIAGNOSIS — R2689 Other abnormalities of gait and mobility: Secondary | ICD-10-CM | POA: Diagnosis not present

## 2019-02-10 DIAGNOSIS — R339 Retention of urine, unspecified: Secondary | ICD-10-CM | POA: Diagnosis not present

## 2019-02-10 DIAGNOSIS — N401 Enlarged prostate with lower urinary tract symptoms: Secondary | ICD-10-CM | POA: Diagnosis not present

## 2019-02-10 DIAGNOSIS — N138 Other obstructive and reflux uropathy: Secondary | ICD-10-CM | POA: Diagnosis not present

## 2019-02-11 DIAGNOSIS — J449 Chronic obstructive pulmonary disease, unspecified: Secondary | ICD-10-CM | POA: Diagnosis not present

## 2019-02-11 DIAGNOSIS — M6281 Muscle weakness (generalized): Secondary | ICD-10-CM | POA: Diagnosis not present

## 2019-02-11 DIAGNOSIS — I10 Essential (primary) hypertension: Secondary | ICD-10-CM | POA: Diagnosis not present

## 2019-02-11 DIAGNOSIS — R2689 Other abnormalities of gait and mobility: Secondary | ICD-10-CM | POA: Diagnosis not present

## 2019-02-12 DIAGNOSIS — R2689 Other abnormalities of gait and mobility: Secondary | ICD-10-CM | POA: Diagnosis not present

## 2019-02-12 DIAGNOSIS — M6281 Muscle weakness (generalized): Secondary | ICD-10-CM | POA: Diagnosis not present

## 2019-02-12 DIAGNOSIS — J449 Chronic obstructive pulmonary disease, unspecified: Secondary | ICD-10-CM | POA: Diagnosis not present

## 2019-02-12 DIAGNOSIS — I10 Essential (primary) hypertension: Secondary | ICD-10-CM | POA: Diagnosis not present

## 2019-02-15 DIAGNOSIS — R2689 Other abnormalities of gait and mobility: Secondary | ICD-10-CM | POA: Diagnosis not present

## 2019-02-15 DIAGNOSIS — I10 Essential (primary) hypertension: Secondary | ICD-10-CM | POA: Diagnosis not present

## 2019-02-15 DIAGNOSIS — J449 Chronic obstructive pulmonary disease, unspecified: Secondary | ICD-10-CM | POA: Diagnosis not present

## 2019-02-15 DIAGNOSIS — M6281 Muscle weakness (generalized): Secondary | ICD-10-CM | POA: Diagnosis not present

## 2019-02-16 DIAGNOSIS — M6281 Muscle weakness (generalized): Secondary | ICD-10-CM | POA: Diagnosis not present

## 2019-02-16 DIAGNOSIS — R2689 Other abnormalities of gait and mobility: Secondary | ICD-10-CM | POA: Diagnosis not present

## 2019-02-16 DIAGNOSIS — I10 Essential (primary) hypertension: Secondary | ICD-10-CM | POA: Diagnosis not present

## 2019-02-16 DIAGNOSIS — J449 Chronic obstructive pulmonary disease, unspecified: Secondary | ICD-10-CM | POA: Diagnosis not present

## 2019-02-17 DIAGNOSIS — G629 Polyneuropathy, unspecified: Secondary | ICD-10-CM | POA: Diagnosis not present

## 2019-02-17 DIAGNOSIS — K219 Gastro-esophageal reflux disease without esophagitis: Secondary | ICD-10-CM | POA: Diagnosis not present

## 2019-02-17 DIAGNOSIS — R2689 Other abnormalities of gait and mobility: Secondary | ICD-10-CM | POA: Diagnosis not present

## 2019-02-17 DIAGNOSIS — E119 Type 2 diabetes mellitus without complications: Secondary | ICD-10-CM | POA: Diagnosis not present

## 2019-02-17 DIAGNOSIS — M6281 Muscle weakness (generalized): Secondary | ICD-10-CM | POA: Diagnosis not present

## 2019-02-17 DIAGNOSIS — R739 Hyperglycemia, unspecified: Secondary | ICD-10-CM | POA: Diagnosis not present

## 2019-02-17 DIAGNOSIS — J449 Chronic obstructive pulmonary disease, unspecified: Secondary | ICD-10-CM | POA: Diagnosis not present

## 2019-02-17 DIAGNOSIS — I10 Essential (primary) hypertension: Secondary | ICD-10-CM | POA: Diagnosis not present

## 2019-02-18 DIAGNOSIS — R2689 Other abnormalities of gait and mobility: Secondary | ICD-10-CM | POA: Diagnosis not present

## 2019-02-18 DIAGNOSIS — I10 Essential (primary) hypertension: Secondary | ICD-10-CM | POA: Diagnosis not present

## 2019-02-18 DIAGNOSIS — M6281 Muscle weakness (generalized): Secondary | ICD-10-CM | POA: Diagnosis not present

## 2019-02-18 DIAGNOSIS — J449 Chronic obstructive pulmonary disease, unspecified: Secondary | ICD-10-CM | POA: Diagnosis not present

## 2019-03-13 ENCOUNTER — Encounter (HOSPITAL_COMMUNITY): Payer: Self-pay | Admitting: Emergency Medicine

## 2019-03-13 ENCOUNTER — Other Ambulatory Visit: Payer: Self-pay

## 2019-03-13 ENCOUNTER — Emergency Department (HOSPITAL_COMMUNITY)
Admission: EM | Admit: 2019-03-13 | Discharge: 2019-03-14 | Disposition: A | Discharge status: Home / Self Care | Payer: Medicare HMO | Attending: Emergency Medicine | Admitting: Emergency Medicine

## 2019-03-13 DIAGNOSIS — J449 Chronic obstructive pulmonary disease, unspecified: Secondary | ICD-10-CM | POA: Diagnosis not present

## 2019-03-13 DIAGNOSIS — Z89411 Acquired absence of right great toe: Secondary | ICD-10-CM | POA: Insufficient documentation

## 2019-03-13 DIAGNOSIS — R52 Pain, unspecified: Secondary | ICD-10-CM | POA: Diagnosis not present

## 2019-03-13 DIAGNOSIS — F17210 Nicotine dependence, cigarettes, uncomplicated: Secondary | ICD-10-CM | POA: Diagnosis not present

## 2019-03-13 DIAGNOSIS — Z794 Long term (current) use of insulin: Secondary | ICD-10-CM | POA: Diagnosis not present

## 2019-03-13 DIAGNOSIS — Z5900 Homelessness unspecified: Secondary | ICD-10-CM

## 2019-03-13 DIAGNOSIS — E1065 Type 1 diabetes mellitus with hyperglycemia: Secondary | ICD-10-CM | POA: Insufficient documentation

## 2019-03-13 DIAGNOSIS — E1165 Type 2 diabetes mellitus with hyperglycemia: Secondary | ICD-10-CM | POA: Diagnosis not present

## 2019-03-13 DIAGNOSIS — Z9114 Patient's other noncompliance with medication regimen: Secondary | ICD-10-CM

## 2019-03-13 DIAGNOSIS — Z79899 Other long term (current) drug therapy: Secondary | ICD-10-CM | POA: Insufficient documentation

## 2019-03-13 DIAGNOSIS — R110 Nausea: Secondary | ICD-10-CM | POA: Diagnosis not present

## 2019-03-13 DIAGNOSIS — R739 Hyperglycemia, unspecified: Secondary | ICD-10-CM

## 2019-03-13 DIAGNOSIS — I10 Essential (primary) hypertension: Secondary | ICD-10-CM | POA: Insufficient documentation

## 2019-03-13 LAB — CBG MONITORING, ED
Glucose-Capillary: 583 mg/dL (ref 70–99)
Glucose-Capillary: 588 mg/dL (ref 70–99)

## 2019-03-13 LAB — URINALYSIS, ROUTINE W REFLEX MICROSCOPIC
Bacteria, UA: NONE SEEN
Bilirubin Urine: NEGATIVE
Glucose, UA: >=500 mg/dL — AB
Hgb urine dipstick: NEGATIVE
Ketones, ur: NEGATIVE mg/dL
Leukocytes,Ua: NEGATIVE
Nitrite: NEGATIVE
Protein, ur: >=300 mg/dL — AB
Specific Gravity, Urine: 1.021 (ref 1.005–1.030)
pH: 8 (ref 5.0–8.0)

## 2019-03-13 LAB — BASIC METABOLIC PANEL
Anion gap: 9 (ref 5–15)
BUN: 26 mg/dL — ABNORMAL HIGH (ref 8–23)
CO2: 28 mmol/L (ref 22–32)
Calcium: 8.8 mg/dL — ABNORMAL LOW (ref 8.9–10.3)
Chloride: 96 mmol/L — ABNORMAL LOW (ref 98–111)
Creatinine, Ser: 1.54 mg/dL — ABNORMAL HIGH (ref 0.61–1.24)
GFR calc Af Amer: 55 mL/min — ABNORMAL LOW (ref >60)
GFR calc non Af Amer: 48 mL/min — ABNORMAL LOW (ref >60)
Glucose, Bld: 672 mg/dL (ref 70–99)
Potassium: 4.5 mmol/L (ref 3.5–5.1)
Sodium: 133 mmol/L — ABNORMAL LOW (ref 135–145)

## 2019-03-13 LAB — CBC
HCT: 35.2 % — ABNORMAL LOW (ref 39.0–52.0)
Hemoglobin: 11.1 g/dL — ABNORMAL LOW (ref 13.0–17.0)
MCH: 27.3 pg (ref 26.0–34.0)
MCHC: 31.5 g/dL (ref 30.0–36.0)
MCV: 86.7 fL (ref 80.0–100.0)
Platelets: 413 10*3/uL — ABNORMAL HIGH (ref 150–400)
RBC: 4.06 MIL/uL — ABNORMAL LOW (ref 4.22–5.81)
RDW: 13.7 % (ref 11.5–15.5)
WBC: 8.8 10*3/uL (ref 4.0–10.5)
nRBC: 0 % (ref 0.0–0.2)

## 2019-03-13 MED ORDER — DEXTROSE 50 % IV SOLN: 0.0000 mL | INTRAVENOUS | Status: DC | PRN

## 2019-03-13 MED ORDER — DEXTROSE-NACL 5-0.45 % IV SOLN: INTRAVENOUS | Status: DC

## 2019-03-13 MED ORDER — SODIUM CHLORIDE 0.9 % IV SOLN
INTRAVENOUS | Status: DC
  Administered 2019-03-14: 02:00:00 via INTRAVENOUS

## 2019-03-13 MED ORDER — INSULIN REGULAR(HUMAN) IN NACL 100-0.9 UT/100ML-% IV SOLN
INTRAVENOUS | Status: DC
  Administered 2019-03-14: 01:00:00 11 [IU]/h via INTRAVENOUS
  Filled 2019-03-13: qty 100

## 2019-03-13 NOTE — ED Triage Notes (Signed)
Patient is homeless brought in by [facility]. Patient is complaining of high blood sugar and polyuria. Patient states he is out of his insulin.

## 2019-03-13 NOTE — ED Provider Notes (Signed)
[facility] DEPT Provider Note   CSN: [id number] Arrival date & time: 03/13/19  1904     History   Chief Complaint Chief Complaint  Patient presents with   Hyperglycemia    HPI [name] is a 63 y.o. male.     Patient with history of homelessness, HTN, T1DM, heroin abuse/overdose, presents with high blood sugar since running out of his insulin x 2 days. No vomiting, fever, SOB, CP, abdominal pain.   The history is provided by the patient. No language interpreter was used.    Past Medical History:  Diagnosis Date   DEPRESSION    DIABETES MELLITUS, TYPE I    DRUG ABUSE    pt should have NO controlled substances rx'ed   Glaucoma    HEPATITIS C    chronic   Heroin overdose ([place]) 10/03/2018   Heroin use 10/03/2018   Hypertension 02/18/2011   Proliferative diabetic retinopathy(362.02)    Vitiligo     Patient Active Problem List   Diagnosis Date Noted   Adjustment disorder with mixed disturbance of emotions and conduct    Sepsis ([place]) 11/11/2018   Hyperkalemia 11/11/2018   Esophageal dysphagia 10/13/2018   Acute esophagitis 10/13/2018   Loose stools 10/13/2018   Routine general medical examination at a health care facility 10/13/2018   Left lower lobe pneumonia 10/07/2018   Acute respiratory failure ([place])    AMS (altered mental status)    Gastroesophageal reflux disease    Hallucinations    Hypoglycemia associated with diabetes ([place]) 08/12/2018   At risk for adverse drug event 02/17/2018   Abrasions of multiple sites    Demand ischemia of myocardium ([place]) 65/68/1275   Acute metabolic encephalopathy 17/00/1749   DKA (diabetic ketoacidoses) ([place]) 02/03/2018   Leukocytosis 02/03/2018   Hypoglycemia 12/24/2017   Syncope 12/24/2017   Syncope and collapse 12/23/2017   IV drug abuse ([place]) 04/19/2017   Acute encephalopathy 04/19/2017   Hemangioma 10/02/2016   Foreign body (FB) in soft tissue     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[place])    Gangrene ([place])    Type I (juvenile type) diabetes mellitus without mention of complication, not stated as uncontrolled    Tobacco abuse    Essential hypertension    Diabetic infection of right foot ([place]) 07/22/2016   Diabetic foot infection ([place]) 07/22/2016   Chronic ulcer of heel, right, with fat layer exposed ([place]) 05/20/2016   DKA, type 1 ([place]) 04/18/2016   AKI (acute kidney injury) ([place]) 04/18/2016   Hyponatremia 04/18/2016   Severe protein-calorie malnutrition ([place]) 04/18/2016   Elevated troponin 04/18/2016   Poor dentition 07/12/2015   Tobacco use 07/06/2013   PAD (peripheral artery disease) ([place]) 04/29/2013   COPD, mild ([place])    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 ([place]) 11/18/2011   Diabetic neuropathy ([place]) 06/21/2010   PROLIFERATIVE DIABETIC RETINOPATHY 06/21/2010   SKIN TAG 01/30/2010   Chronic hepatitis C ([place]) 11/22/2008   VITILIGO 11/22/2008   PROTEINURIA, MILD 11/22/2008   Substance abuse ([place]) 04/23/2007   DEPRESSION 11/11/2006    Past Surgical History:  Procedure Laterality Date   ABDOMINAL AORTOGRAM W/LOWER EXTREMITY N/A 07/25/2016   Procedure: Abdominal Aortogram w/Bilateral Lower Extremity Runoff;  Surgeon: Conrad Blythedale, MD;  Location: [facility];  Service: Cardiovascular;  Laterality: N/A;   ABDOMINAL AORTOGRAM W/LOWER EXTREMITY N/A 12/24/2016   Procedure: ABDOMINAL AORTOGRAM W/LOWER EXTREMITY;  Surgeon: Serafina Mitchell, MD;  Location: [facility]  Service: Cardiovascular;  Laterality: N/A;  °• AMPUTATION TOE Right 07/26/2016  ° Procedure: AMPUTATION GREAT TOE;  Surgeon: Vance W Brabham, MD;  Location: [facility];  Service: Vascular;  Laterality: Right;  °• BIOPSY  08/25/2018  ° Procedure: BIOPSY;  Surgeon: Mansouraty, Gabriel Jr., MD;  Location: [facility];  Service: Gastroenterology;;  °• BIOPSY  11/16/2018  ° Procedure: BIOPSY;  Surgeon: Pyrtle, Jay M, MD;   Location: [facility];  Service: Gastroenterology;;  °• ESOPHAGOGASTRODUODENOSCOPY N/A 08/25/2018  ° Procedure: ESOPHAGOGASTRODUODENOSCOPY (EGD);  Surgeon: Mansouraty, Gabriel Jr., MD;  [facility];  Service: Gastroenterology;  Laterality: N/A;  °• ESOPHAGOGASTRODUODENOSCOPY (EGD) WITH PROPOFOL N/A 11/16/2018  ° Procedure: ESOPHAGOGASTRODUODENOSCOPY (EGD) WITH PROPOFOL;  Surgeon: Pyrtle, Jay M, MD;  [facility];  Service: Gastroenterology;  Laterality: N/A;  °• EYE SURGERY    ° retinal surgery x 2, right eye  °• INSERTION OF ILIAC STENT Right 07/26/2016  ° Procedure: INSERTION OF RIGHT POPLITEAL STENT WITH BALLOON ANGIOPLASTY;  Surgeon: Vance W Brabham, MD;  Location[facility];  Service: Vascular;  Laterality: Right;  °• LOWER EXTREMITY ANGIOGRAM Right 07/26/2016  ° Procedure: LOWER EXTREMITY ANGIOGRAM;  Surgeon: Vance W Brabham, MD;  [facility];  Service: Vascular;  Laterality: Right;  °• PERIPHERAL VASCULAR BALLOON ANGIOPLASTY Right 07/26/2016  ° Procedure: PERIPHERAL VASCULAR BALLOON ANGIOPLASTY  RIGHT ANTERIOR TIBEAL ARTERY AND RIGHT SUPERFICIAL FEMORAL ARTERY;  Surgeon: Vance W Brabham, MD;  Location: [facility];  Service: Vascular;  Laterality: Right;  °• PERIPHERAL VASCULAR INTERVENTION Right 12/24/2016  ° Procedure: PERIPHERAL VASCULAR INTERVENTION;  Surgeon: Brabham, Vance W, MD;  [facility];  Service: Cardiovascular;  Laterality: Right;  lower extr  °  ° ° ° ° °Home Medications   ° °Prior to Admission medications   °Medication Sig Start Date End Date Taking? Authorizing Provider  °Accu-Chek Softclix Lancets lancets Use to monitor glucose levels BID; E10.29 08/19/18   Ellison, Sean, MD  °Blood Glucose Calibration (ACCU-CHEK AVIVA) SOLN 1 each by In Vitro route as needed. Use to calibrate meter prn 08/19/18   Ellison, Sean, MD  °blood glucose meter kit and supplies KIT Dispense based on patient and insurance preference. Use up to four times daily as directed. (FOR ICD-9 250.00,  250.01). 08/15/18   Mikhail, Maryann, DO  °Blood Glucose Monitoring Suppl (ACCU-CHEK AVIVA PLUS) w/Device KIT 1 each by Does not apply route 2 (two) times a day. Use to monitor glucose levels BID; E10.29 08/19/18   Ellison, Sean, MD  °diclofenac sodium (VOLTAREN) 1 % GEL Apply 2 g topically 4 (four) times daily. 11/24/18   Nettey, Ralph A, MD  °feeding supplement, GLUCERNA SHAKE, (GLUCERNA SHAKE) LIQD Take 237 mLs by mouth 3 (three) times daily between meals. 11/24/18   Nettey, Ralph A, MD  °folic acid (FOLVITE) 1 MG tablet Take 1 tablet (1 mg total) by mouth daily. 09/11/18   Madera, Carlos, MD  °gabapentin (NEURONTIN) 400 MG capsule Take 400 mg by mouth 3 (three) times daily.    [provider]  °glucose blood (ACCU-CHEK AVIVA PLUS) test strip Use to monitor glucose levels BID; E10.29 08/19/18   Ellison, Sean, MD  °insulin aspart (NOVOLOG) 100 UNIT/ML injection Inject 5 Units into the skin daily with lunch. 11/24/18   Nettey, Ralph A, MD  °insulin aspart protamine- aspart (NOVOLOG MIX 70/30) (70-30) 100 UNIT/ML injection Inject 0.15 mLs (15 Units total) into the skin 2 (two) times daily with a meal. 11/24/18   Nettey, Ralph A, MD  °Insulin   Pen Needle 31G X 5 MM MISC Use with novolog flex pen 08/15/18   Mikhail, Maryann, DO  °Isopropyl Alcohol Wipes 70 % MISC Apply 1 each topically daily as needed. 02/27/18   Medina-Vargas, Monina C, NP  °mirtazapine (REMERON) 15 MG tablet Take 1 tablet (15 mg total) by mouth at bedtime. 11/24/18   Nettey, Ralph A, MD  °Multiple Vitamin (MULTIVITAMIN WITH MINERALS) TABS tablet Take 1 tablet by mouth daily. 11/25/18   Nettey, Ralph A, MD  °opium-belladonna (B&O SUPPRETTES) 16.2-60 MG suppository Place 1 suppository rectally every 8 (eight) hours as needed for bladder spasms. 11/24/18   Nettey, Ralph A, MD  °pantoprazole (PROTONIX) 40 MG tablet Take 1 tablet (40 mg total) by mouth 2 (two) times daily. 10/13/18   Mansouraty, Gabriel Jr., MD  °polyethylene glycol (MIRALAX / GLYCOLAX) 17 g  packet Take 17 g by mouth 2 (two) times daily. 11/24/18   Nettey, Ralph A, MD  °senna-docusate (SENOKOT-S) 8.6-50 MG tablet Take 1 tablet by mouth 2 (two) times daily. 11/24/18   Nettey, Ralph A, MD  °tamsulosin (FLOMAX) 0.4 MG CAPS capsule Take 1 capsule (0.4 mg total) by mouth daily after supper. °Patient not taking: Reported on 11/11/2018 10/11/18   Krishnan, Sendil K, MD  °traMADol (ULTRAM) 50 MG tablet Take 1 tablet (50 mg total) by mouth every 12 (twelve) hours as needed for moderate pain. 11/24/18   Nettey, Ralph A, MD  ° ° °Family History °Family History  °Problem Relation Age of Onset  °• Diabetes Father   °• Kidney disease Father   °• Arthritis Mother   °• Heart disease Mother   °     CAD  °• Colon cancer Neg Hx   °• Esophageal cancer Neg Hx   °• Inflammatory bowel disease Neg Hx   °• Liver disease Neg Hx   °• Pancreatic cancer Neg Hx   °• Rectal cancer Neg Hx   °• Stomach cancer Neg Hx   ° ° °Social History °Social History  ° °Tobacco Use  °• Smoking status: Current Every Day Smoker  °  Packs/day: 1.00  °  Years: 51.00  °  Pack years: 51.00  °  Types: Cigarettes  °• Smokeless tobacco: Former User  °  Types: Chew  °  Quit date: 1985  °Substance Use Topics  °• Alcohol use: No  °  Alcohol/week: 0.0 standard drinks  °  Comment: 08/14/14 none  °• Drug use: Yes  °  Types: Marijuana, Heroin, Cocaine, "Crack" cocaine, Fentanyl, Methylphenidate  °  Comment: s/p rehab x 6, occasional drug use (per pt last  use was 06/2015)  ° ° ° °Allergies   °Codeine ° ° °Review of Systems °Review of Systems  °Constitutional: Negative for chills and fever.  °HENT: Negative.   °Respiratory: Negative.   °Cardiovascular: Negative.   °Gastrointestinal: Negative.   °Endocrine: Positive for polyuria.  °Genitourinary: Negative for dysuria.  °Musculoskeletal: Negative.   °Skin: Negative.   °Neurological: Negative.   ° ° ° °Physical Exam °Updated Vital Signs °BP (!) 194/82 (BP Location: Left Arm)    Pulse 77    Temp 99 °F (37.2 °C) (Oral)    Resp  16    SpO2 99%  ° °Physical Exam °Vitals signs and nursing note reviewed.  °Constitutional:   °   General: He is not in acute distress. °   Appearance: He is well-developed.  °   Comments: Disheveled.   °HENT:  °   Head: Normocephalic.  °Neck:  °     Musculoskeletal: Normal range of motion and neck supple.  Cardiovascular:     Rate and Rhythm: Normal rate and regular rhythm.     Heart sounds: No murmur.  Pulmonary:     Effort: Pulmonary effort is normal.     Breath sounds: Normal breath sounds. No wheezing, rhonchi or rales.  Abdominal:     General: Bowel sounds are normal.     Palpations: Abdomen is soft.     Tenderness: There is no abdominal tenderness. There is no guarding or rebound.  Musculoskeletal: Normal range of motion.  Skin:    General: Skin is warm and dry.  Neurological:     Mental Status: He is alert and oriented to person, place, and time.      ED Treatments / Results  Labs (all labs ordered are listed, but only abnormal results are displayed) Labs Reviewed  BASIC METABOLIC PANEL - Abnormal; Notable for the following components:      Result Value   Sodium 133 (*)    Chloride 96 (*)    Glucose, Bld 672 (*)    BUN 26 (*)    Creatinine, Ser 1.54 (*)    Calcium 8.8 (*)    GFR calc non Af Amer 48 (*)    GFR calc Af Amer 55 (*)    All other components within normal limits  CBC - Abnormal; Notable for the following components:   RBC 4.06 (*)    Hemoglobin 11.1 (*)    HCT 35.2 (*)    Platelets 413 (*)    All other components within normal limits  URINALYSIS, ROUTINE W REFLEX MICROSCOPIC - Abnormal; Notable for the following components:   Color, Urine STRAW (*)    Glucose, UA >=500 (*)    Protein, ur >=300 (*)    All other components within normal limits  CBG MONITORING, ED - Abnormal; Notable for the following components:   Glucose-Capillary 583 (*)    All other components within normal limits  CBG MONITORING, ED - Abnormal; Notable for the following components:     Glucose-Capillary 588 (*)    All other components within normal limits  CBG MONITORING, ED    EKG None  Radiology No results found.  Procedures Procedures (including critical care time)  Medications Ordered in ED Medications  insulin regular, human (MYXREDLIN) 100 units/ 100 mL infusion (has no administration in time range)  0.9 %  sodium chloride infusion (has no administration in time range)  dextrose 5 %-0.45 % sodium chloride infusion (has no administration in time range)  dextrose 50 % solution 0-50 mL (has no administration in time range)     Initial Impression / Assessment and Plan / ED Course  I have reviewed the triage vital signs and the nursing notes.  Pertinent labs & imaging results that were available during my care of the patient were reviewed by me and considered in my medical decision making (see chart for details).        Patient found to have CBG 672 without evidence of DKA. He was started on the ED hyperglycemia protocol with insulin drip, labs, monitoring. He is awake, alert, somewhat cooperative. VSS.   Through the course of ED encounter, his blood sugar decreases. Insulin drip d/c'd at 175. Patient subsequently pulled out his IV. CBG was monitored every hour for concerning drops and reaches low of 76. The patient is given oral glucose (did not receive IV dextrose), and he is eating. No change in mental status, VS and  no development of new symptoms.  ° °He is felt appropriate for discharge home. Will Rx insulin to his pharmacy of choice.  ° °Final Clinical Impressions(s) / ED Diagnoses  ° °Final diagnoses:  °None  ° °1. IDDM °2. Hyperglycemia without DKA °3. Medication noncompliance °4. Homelessness ° ° °ED Discharge Orders   ° None  °  ° °  °, , PA-C °03/14/19 0610 ° °  °Molpus, John, MD °03/14/19 0659 ° °

## 2019-03-13 NOTE — ED Notes (Signed)
Patient was given water to drink but patient went to vending machine and bought pepsi and candy bar. Patient was asked why did he drink and eat them he stated " what the hell does it matter"

## 2019-03-13 NOTE — ED Notes (Signed)
Date and time results received: 03/13/19 2041 (use smartphrase ".now" to insert current time)  Test: Glucose Critical Value: 672  Name of Provider Notified: Plunkett Md  Orders Received? Or Actions Taken?: waiting orders and a bed

## 2019-03-14 ENCOUNTER — Encounter (HOSPITAL_COMMUNITY): Payer: Self-pay | Admitting: *Deleted

## 2019-03-14 ENCOUNTER — Emergency Department (HOSPITAL_COMMUNITY)
Admission: EM | Admit: 2019-03-14 | Discharge: 2019-03-14 | Disposition: A | Discharge status: Home / Self Care | Payer: Medicare HMO | Source: Home / Self Care | Attending: Emergency Medicine | Admitting: Emergency Medicine

## 2019-03-14 DIAGNOSIS — F17210 Nicotine dependence, cigarettes, uncomplicated: Secondary | ICD-10-CM | POA: Insufficient documentation

## 2019-03-14 DIAGNOSIS — E1065 Type 1 diabetes mellitus with hyperglycemia: Secondary | ICD-10-CM | POA: Insufficient documentation

## 2019-03-14 DIAGNOSIS — I10 Essential (primary) hypertension: Secondary | ICD-10-CM | POA: Insufficient documentation

## 2019-03-14 DIAGNOSIS — R739 Hyperglycemia, unspecified: Secondary | ICD-10-CM

## 2019-03-14 DIAGNOSIS — Z79899 Other long term (current) drug therapy: Secondary | ICD-10-CM | POA: Insufficient documentation

## 2019-03-14 DIAGNOSIS — J449 Chronic obstructive pulmonary disease, unspecified: Secondary | ICD-10-CM | POA: Insufficient documentation

## 2019-03-14 LAB — CBG MONITORING, ED
Glucose-Capillary: 107 mg/dL — ABNORMAL HIGH (ref 70–99)
Glucose-Capillary: 175 mg/dL — ABNORMAL HIGH (ref 70–99)
Glucose-Capillary: 266 mg/dL — ABNORMAL HIGH (ref 70–99)
Glucose-Capillary: 382 mg/dL — ABNORMAL HIGH (ref 70–99)
Glucose-Capillary: 590 mg/dL (ref 70–99)
Glucose-Capillary: 595 mg/dL (ref 70–99)
Glucose-Capillary: 76 mg/dL (ref 70–99)

## 2019-03-14 MED ORDER — INSULIN ASPART PROT & ASPART (70-30 MIX) 100 UNIT/ML ~~LOC~~ SUSP: 15.0000 [IU] | Freq: Two times a day (BID) | SUBCUTANEOUS | 0 refills | Status: DC

## 2019-03-14 MED ORDER — INSULIN ASPART 100 UNIT/ML ~~LOC~~ SOLN: 5.0000 [IU] | Freq: Every day | SUBCUTANEOUS | 0 refills | Status: DC

## 2019-03-14 MED ORDER — GLUCOSE 40 % PO GEL
1.0000 | Freq: Once | ORAL | Status: AC
  Administered 2019-03-14: 06:00:00 37.5 g via ORAL
  Filled 2019-03-14: qty 1

## 2019-03-14 MED ORDER — SODIUM CHLORIDE 0.9 % IV BOLUS: 1000.0000 mL | Freq: Once | INTRAVENOUS | Status: DC

## 2019-03-14 NOTE — ED Triage Notes (Signed)
Pt states he could not physically get to bus stop after being discharged last night. Pt also states he would like something to eat. Pt has no other complaints.

## 2019-03-14 NOTE — ED Notes (Addendum)
insuiln and fluids  d/c @ 3:27 am per provider order -

## 2019-03-14 NOTE — ED Notes (Signed)
Patient given milk and graham crackers

## 2019-03-14 NOTE — ED Notes (Signed)
Provider notified - does not want another IV at this time  oj and sand witch provided to pt

## 2019-03-14 NOTE — ED Notes (Signed)
Pt rolled to bus stop.

## 2019-03-14 NOTE — ED Provider Notes (Signed)
[facility] DEPT Provider Note   CSN: [id number] Arrival date & time: 03/14/19  0827     History   Chief Complaint Chief Complaint  Patient presents with  . needs ride home    HPI [name] is a 63 y.o. male.     63 year old male who presents concerned with having low blood sugar.  Was just discharged from here after being treated for hyperglycemia.  Patient is homeless and states that the buses were not running so he came back.  He has no new complaints     Past Medical History:  Diagnosis Date  . DEPRESSION   . DIABETES MELLITUS, TYPE I   . DRUG ABUSE    pt should have NO controlled substances rx'ed  . Glaucoma   . HEPATITIS C    chronic  . Heroin overdose ([place]) 10/03/2018  . Heroin use 10/03/2018  . Hypertension 02/18/2011  . 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 Vitiligo     Patient Active Problem List   Diagnosis Date Noted  . Adjustment disorder with mixed disturbance of emotions and conduct   . Sepsis ([place]) 11/11/2018  . Hyperkalemia 11/11/2018  . Esophageal dysphagia 10/13/2018  . Acute esophagitis 10/13/2018  . Loose stools 10/13/2018  . Routine general medical examination at a health care facility 10/13/2018  . Left lower lobe pneumonia 10/07/2018  . Acute respiratory failure ([place])   . AMS (altered mental status)   . Gastroesophageal reflux disease   . Hallucinations   . Hypoglycemia associated with diabetes ([place]) 08/12/2018  . At risk for adverse drug event 02/17/2018  . Abrasions of multiple sites   . Demand ischemia of myocardium ([place]) 02/05/2018  . Acute metabolic encephalopathy 02/09/2535  . DKA (diabetic ketoacidoses) ([place]) 02/03/2018  . Leukocytosis 02/03/2018  . Hypoglycemia 12/24/2017  . Syncope 12/24/2017  . Syncope and collapse 12/23/2017  . IV drug abuse ([place]) 04/19/2017  . Acute encephalopathy 04/19/2017  . Hemangioma 10/02/2016  . Foreign body (FB) in soft tissue   . Osteomyelitis of  toe of right foot ([place])   . Gangrene ([place])   . Type I (juvenile type) diabetes mellitus without mention of complication, not stated as uncontrolled   . Tobacco abuse   . Essential hypertension   . Diabetic infection of right foot ([place]) 07/22/2016  . Diabetic foot infection ([place]) 07/22/2016  . Chronic ulcer of heel, right, with fat layer exposed ([place]) 05/20/2016  . DKA, type 1 ([place]) 04/18/2016  . AKI (acute kidney injury) ([place]) 04/18/2016  . Hyponatremia 04/18/2016  . Severe protein-calorie malnutrition ([place]) 04/18/2016  . Elevated troponin 04/18/2016  . Poor dentition 07/12/2015  . Tobacco use 07/06/2013  . PAD (peripheral artery disease) ([place]) 04/29/2013  . 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 Diabetic neuropathy ([place]) 06/21/2010  . PROLIFERATIVE DIABETIC RETINOPATHY 06/21/2010  . SKIN TAG 01/30/2010  . Chronic hepatitis C ([place]) 11/22/2008  . VITILIGO 11/22/2008  . PROTEINURIA, MILD 11/22/2008  . Substance abuse ([place]) 04/23/2007  . DEPRESSION 11/11/2006    Past Surgical History:  Procedure Laterality Date  . ABDOMINAL AORTOGRAM W/LOWER EXTREMITY N/A 07/25/2016   Procedure: Abdominal Aortogram w/Bilateral Lower Extremity Runoff;  Surgeon: Conrad Harman, MD;  Location: [facility];  Service: Cardiovascular;  Laterality: N/A;  . ABDOMINAL AORTOGRAM W/LOWER EXTREMITY N/A 12/24/2016   Procedure: ABDOMINAL AORTOGRAM W/LOWER EXTREMITY;  Surgeon: Serafina Mitchell, MD;  Location: [facility]  [facility];  Service: Cardiovascular;  Laterality: N/A;  . AMPUTATION TOE Right 07/26/2016   Procedure: AMPUTATION GREAT TOE;  Surgeon: Serafina Mitchell, MD;  Location: [facility];  Service: Vascular;  Laterality: Right;  . BIOPSY  08/25/2018   Procedure: BIOPSY;  Surgeon: Irving Copas., MD;  Location: [facility];  Service: Gastroenterology;;  . BIOPSY  11/16/2018   Procedure: BIOPSY;  Surgeon: Jerene Bears, MD;  Location: [facility]  ENDOSCOPY;  Service: Gastroenterology;;  . ESOPHAGOGASTRODUODENOSCOPY N/A 08/25/2018   Procedure: ESOPHAGOGASTRODUODENOSCOPY (EGD);  Surgeon: Irving Copas., MD;  Location: [facility];  Service: Gastroenterology;  Laterality: N/A;  . ESOPHAGOGASTRODUODENOSCOPY (EGD) WITH PROPOFOL N/A 11/16/2018   Procedure: ESOPHAGOGASTRODUODENOSCOPY (EGD) WITH PROPOFOL;  Surgeon: Jerene Bears, MD;  Location: [facility] ENDOSCOPY;  Service: Gastroenterology;  Laterality: N/A;  . EYE SURGERY     retinal surgery x 2, right eye  . INSERTION OF ILIAC STENT Right 07/26/2016   Procedure: INSERTION OF RIGHT POPLITEAL STENT WITH BALLOON ANGIOPLASTY;  Surgeon: Serafina Mitchell, MD;  Location: [facility];  Service: Vascular;  Laterality: Right;  . LOWER EXTREMITY ANGIOGRAM Right 07/26/2016   Procedure: LOWER EXTREMITY ANGIOGRAM;  Surgeon: Serafina Mitchell, MD;  Location: [facility];  Service: Vascular;  Laterality: Right;  . PERIPHERAL VASCULAR BALLOON ANGIOPLASTY Right 07/26/2016   Procedure: PERIPHERAL VASCULAR BALLOON ANGIOPLASTY  RIGHT ANTERIOR TIBEAL ARTERY AND RIGHT SUPERFICIAL FEMORAL ARTERY;  Surgeon: Serafina Mitchell, MD;  Location: [facility];  Service: Vascular;  Laterality: Right;  . PERIPHERAL VASCULAR INTERVENTION Right 12/24/2016   Procedure: PERIPHERAL VASCULAR INTERVENTION;  Surgeon: Serafina Mitchell, MD;  Location: [facility];  Service: Cardiovascular;  Laterality: Right;  lower extr        Home Medications    Prior to Admission medications   Medication Sig Start Date End Date Taking? Authorizing Provider  Accu-Chek Softclix Lancets lancets Use to monitor glucose levels BID; E10.29 08/19/18   Renato Shin, MD  Blood Glucose Calibration (ACCU-CHEK AVIVA) SOLN 1 each by In Vitro route as needed. Use to calibrate meter prn 08/19/18   Renato Shin, MD  blood glucose meter kit and supplies KIT Dispense based on patient and insurance preference. Use up to four times daily as directed. (FOR ICD-9 250.00, 250.01). 08/15/18    Cristal Ford, DO  Blood Glucose Monitoring Suppl (ACCU-CHEK AVIVA PLUS) w/Device KIT 1 each by Does not apply route 2 (two) times a day. Use to monitor glucose levels BID; E10.29 08/19/18   Renato Shin, MD  diclofenac sodium (VOLTAREN) 1 % GEL Apply 2 g topically 4 (four) times daily. 11/24/18   Mariel Aloe, MD  feeding supplement, GLUCERNA SHAKE, (GLUCERNA SHAKE) LIQD Take 237 mLs by mouth 3 (three) times daily between meals. 11/24/18   Mariel Aloe, MD  folic acid (FOLVITE) 1 MG tablet Take 1 tablet (1 mg total) by mouth daily. 09/11/18   Barton Dubois, MD  gabapentin (NEURONTIN) 600 MG tablet Take 600 mg by mouth 3 (three) times daily. 02/09/19   [provider]  glucose blood (ACCU-CHEK AVIVA PLUS) test strip Use to monitor glucose levels BID; E10.29 08/19/18   Renato Shin, MD  insulin aspart (NOVOLOG) 100 UNIT/ML injection Inject 5 Units into the skin daily with lunch. 03/14/19   Charlann Lange, PA-C  insulin aspart protamine- aspart (NOVOLOG MIX 70/30) (70-30) 100 UNIT/ML injection Inject 0.15 mLs (15 Units total) into the skin 2 (two) times daily with a meal. 03/14/19   Charlann Lange, PA-C  Insulin Pen Needle 31G X 5 MM MISC Use with novolog flex pen 08/15/18   Cristal Ford, DO  Isopropyl Alcohol Wipes 70 % MISC Apply 1 each topically daily as needed. 02/27/18   Medina-Vargas, Monina C, NP  mirtazapine (REMERON) 15 MG tablet Take 1 tablet (15 mg total) by mouth at bedtime. 11/24/18   Mariel Aloe, MD  Multiple Vitamin (MULTIVITAMIN WITH MINERALS) TABS tablet Take 1 tablet by mouth daily. 11/25/18   Mariel Aloe, MD  opium-belladonna (B&O SUPPRETTES) 16.2-60 MG suppository Place 1 suppository rectally every 8 (eight) hours as needed for bladder spasms. 11/24/18   Mariel Aloe, MD  pantoprazole (PROTONIX) 40 MG tablet Take 1 tablet (40 mg total) by mouth 2 (two) times daily. 10/13/18   Mansouraty, Telford Nab., MD  polyethylene glycol (MIRALAX / GLYCOLAX) 17 g packet  Take 17 g by mouth 2 (two) times daily. 11/24/18   Mariel Aloe, MD  senna-docusate (SENOKOT-S) 8.6-50 MG tablet Take 1 tablet by mouth 2 (two) times daily. 11/24/18   Mariel Aloe, MD  tamsulosin (FLOMAX) 0.4 MG CAPS capsule Take 1 capsule (0.4 mg total) by mouth daily after supper. Patient not taking: Reported on 11/11/2018 10/11/18   Annita Brod, MD  traMADol (ULTRAM) 50 MG tablet Take 1 tablet (50 mg total) by mouth every 12 (twelve) hours as needed for moderate pain. 11/24/18   Mariel Aloe, MD    Family History Family History  Problem Relation Age of Onset  . Diabetes Father   . Kidney disease Father   . Arthritis Mother   . Heart disease Mother        CAD  . Colon cancer Neg Hx   . Esophageal cancer Neg Hx   . Inflammatory bowel disease Neg Hx   . Liver disease Neg Hx   . Pancreatic cancer Neg Hx   . Rectal cancer Neg Hx   . Stomach cancer Neg Hx     Social History Social History   Tobacco Use  . Smoking status: Current Every Day Smoker    Packs/day: 1.00    Years: 51.00    Pack years: 51.00    Types: Cigarettes  . Smokeless tobacco: Former Systems developer    Types: Chew    Quit date: 1985  Substance Use Topics  . Alcohol use: No    Alcohol/week: 0.0 standard drinks    Comment: 08/14/14 none  . Drug use: Yes    Types: Marijuana, Heroin, Cocaine, "Crack" cocaine, Fentanyl, Methylphenidate    Comment: s/p rehab x 6, occasional drug use (per pt last  use was 06/2015)     Allergies   Codeine   Review of Systems Review of Systems  All other systems reviewed and are negative.    Physical Exam Updated Vital Signs BP (!) 156/67   Pulse 74   Temp (!) 97.4 F (36.3 C) (Oral)   Resp 17   SpO2 98%   Physical Exam Vitals signs and nursing note reviewed.  Constitutional:      General: He is not in acute distress.    Appearance: Normal appearance. He is well-developed. He is not toxic-appearing.  HENT:     Head: Normocephalic and atraumatic.  Eyes:      General: Lids are normal.     Conjunctiva/sclera: Conjunctivae normal.     Pupils: Pupils are equal, round, and reactive to light.  Neck:     Musculoskeletal: Normal range of motion and neck supple.  Thyroid: No thyroid mass.     Trachea: No tracheal deviation.  Cardiovascular:     Rate and Rhythm: Normal rate and regular rhythm.     Heart sounds: Normal heart sounds. No murmur. No gallop.   Pulmonary:     Effort: Pulmonary effort is normal. No respiratory distress.     Breath sounds: Normal breath sounds. No stridor. No decreased breath sounds, wheezing, rhonchi or rales.  Abdominal:     General: Bowel sounds are normal. There is no distension.     Palpations: Abdomen is soft.     Tenderness: There is no abdominal tenderness. There is no rebound.  Musculoskeletal: Normal range of motion.        General: No tenderness.  Skin:    General: Skin is warm and dry.     Findings: No abrasion or rash.  Neurological:     Mental Status: He is alert and oriented to person, place, and time.     GCS: GCS eye subscore is 4. GCS verbal subscore is 5. GCS motor subscore is 6.     Cranial Nerves: No cranial nerve deficit.     Sensory: No sensory deficit.  Psychiatric:        Speech: Speech normal.        Behavior: Behavior normal.      ED Treatments / Results  Labs (all labs ordered are listed, but only abnormal results are displayed) Labs Reviewed  CBG MONITORING, ED - Abnormal; Notable for the following components:      Result Value   Glucose-Capillary 266 (*)    All other components within normal limits    EKG None  Radiology No results found.  Procedures Procedures (including critical care time)  Medications Ordered in ED Medications - No data to display   Initial Impression / Assessment and Plan / ED Course  I have reviewed the triage vital signs and the nursing notes.  Pertinent labs & imaging results that were available during my care of the patient were reviewed  by me and considered in my medical decision making (see chart for details).        Patient's blood sugar 266.  Patient stable for discharge  Final Clinical Impressions(s) / ED Diagnoses   Final diagnoses:  None    ED Discharge Orders    None       Lacretia Leigh, MD 03/14/19 [phone]

## 2019-03-14 NOTE — ED Notes (Signed)
Enter PT room to complete CBG. This Probation officer notice blood on PT arm as well as bed. Once writer removed cover back notice PT IV out of arm. PT state he had to use urinal and did not mean to pull IV out. RN have been made aware. PT/bed clean and change with new grown and sheets. Writer place condom cath on PT

## 2019-03-15 ENCOUNTER — Emergency Department (HOSPITAL_COMMUNITY)
Admission: EM | Admit: 2019-03-15 | Discharge: 2019-03-15 | Disposition: A | Discharge status: Home / Self Care | Payer: Medicare HMO | Attending: Emergency Medicine | Admitting: Emergency Medicine

## 2019-03-15 ENCOUNTER — Other Ambulatory Visit: Payer: Self-pay

## 2019-03-15 ENCOUNTER — Encounter (HOSPITAL_COMMUNITY): Payer: Self-pay | Admitting: Emergency Medicine

## 2019-03-15 DIAGNOSIS — F1210 Cannabis abuse, uncomplicated: Secondary | ICD-10-CM | POA: Insufficient documentation

## 2019-03-15 DIAGNOSIS — Z794 Long term (current) use of insulin: Secondary | ICD-10-CM | POA: Diagnosis not present

## 2019-03-15 DIAGNOSIS — Z79899 Other long term (current) drug therapy: Secondary | ICD-10-CM | POA: Insufficient documentation

## 2019-03-15 DIAGNOSIS — R358 Other polyuria: Secondary | ICD-10-CM | POA: Insufficient documentation

## 2019-03-15 DIAGNOSIS — J449 Chronic obstructive pulmonary disease, unspecified: Secondary | ICD-10-CM | POA: Diagnosis not present

## 2019-03-15 DIAGNOSIS — F1110 Opioid abuse, uncomplicated: Secondary | ICD-10-CM | POA: Diagnosis not present

## 2019-03-15 DIAGNOSIS — F1410 Cocaine abuse, uncomplicated: Secondary | ICD-10-CM | POA: Diagnosis not present

## 2019-03-15 DIAGNOSIS — F17210 Nicotine dependence, cigarettes, uncomplicated: Secondary | ICD-10-CM | POA: Insufficient documentation

## 2019-03-15 DIAGNOSIS — E1065 Type 1 diabetes mellitus with hyperglycemia: Secondary | ICD-10-CM | POA: Insufficient documentation

## 2019-03-15 DIAGNOSIS — R739 Hyperglycemia, unspecified: Secondary | ICD-10-CM

## 2019-03-15 DIAGNOSIS — I10 Essential (primary) hypertension: Secondary | ICD-10-CM | POA: Diagnosis not present

## 2019-03-15 DIAGNOSIS — Z5900 Homelessness unspecified: Secondary | ICD-10-CM

## 2019-03-15 DIAGNOSIS — E1165 Type 2 diabetes mellitus with hyperglycemia: Secondary | ICD-10-CM | POA: Diagnosis not present

## 2019-03-15 LAB — CBC
HCT: 39.8 % (ref 39.0–52.0)
Hemoglobin: 13.1 g/dL (ref 13.0–17.0)
MCH: 27.2 pg (ref 26.0–34.0)
MCHC: 32.9 g/dL (ref 30.0–36.0)
MCV: 82.6 fL (ref 80.0–100.0)
Platelets: 473 10*3/uL — ABNORMAL HIGH (ref 150–400)
RBC: 4.82 MIL/uL (ref 4.22–5.81)
RDW: 13.6 % (ref 11.5–15.5)
WBC: 10.8 10*3/uL — ABNORMAL HIGH (ref 4.0–10.5)
nRBC: 0 % (ref 0.0–0.2)

## 2019-03-15 LAB — BASIC METABOLIC PANEL
Anion gap: 10 (ref 5–15)
BUN: 19 mg/dL (ref 8–23)
CO2: 26 mmol/L (ref 22–32)
Calcium: 9.5 mg/dL (ref 8.9–10.3)
Chloride: 101 mmol/L (ref 98–111)
Creatinine, Ser: 1.41 mg/dL — ABNORMAL HIGH (ref 0.61–1.24)
GFR calc Af Amer: >60 mL/min (ref >60)
GFR calc non Af Amer: 53 mL/min — ABNORMAL LOW (ref >60)
Glucose, Bld: 434 mg/dL — ABNORMAL HIGH (ref 70–99)
Potassium: 4.3 mmol/L (ref 3.5–5.1)
Sodium: 137 mmol/L (ref 135–145)

## 2019-03-15 LAB — CBG MONITORING, ED
Glucose-Capillary: 461 mg/dL — ABNORMAL HIGH (ref 70–99)
Glucose-Capillary: 249 mg/dL — ABNORMAL HIGH (ref 70–99)

## 2019-03-15 MED ORDER — SODIUM CHLORIDE 0.9 % IV BOLUS
1000.0000 mL | Freq: Once | INTRAVENOUS | Status: AC
  Administered 2019-03-15: 1000 mL via INTRAVENOUS

## 2019-03-15 MED ORDER — INSULIN ASPART PROT & ASPART (70-30 MIX) 100 UNIT/ML ~~LOC~~ SUSP
15.0000 [IU] | Freq: Once | SUBCUTANEOUS | Status: AC
  Administered 2019-03-15: 15 [IU] via SUBCUTANEOUS
  Filled 2019-03-15: qty 10

## 2019-03-15 NOTE — Progress Notes (Signed)
Inpatient Diabetes Program Recommendations  AACE/ADA: New Consensus Statement on Inpatient Glycemic Control (2015)  Target Ranges:  Prepandial:   less than 140 mg/dL      Peak postprandial:   less than 180 mg/dL (1-2 hours)      Critically ill patients:  140 - 180 mg/dL   Lab Results  Component Value Date   GLUCAP 461 (H) 03/15/2019   HGBA1C 12.3 (H) 11/12/2018    Review of Glycemic Control  Diabetes history: DM1 (requires basal and meal coverage) Outpatient Diabetes medications: Novolog 70/30 15 units bid, Novolog 5 units with lunch meal Current orders for Inpatient glycemic control: 70/30 15 units 1x dose   Blood sugar 461, 434 mg/dL.  Need updated HgbA1C. Last one 07/30 12.3%.  Inpatient Diabetes Program Recommendations:     70/30 10 units bid Novolog 0-9 units tidwc and 0-5 units QHS Need updated HgbA1C to assess glycemic control  Thank you. Lorenda Peck, RD, LDN, CDE Inpatient Diabetes Coordinator [phone]

## 2019-03-15 NOTE — ED Provider Notes (Signed)
[facility] DEPT Provider Note   CSN: [id number] Arrival date & time: 03/15/19  1207     History   Chief Complaint Chief Complaint  Patient presents with  . Hyperglycemia    HPI [name] is a 63 y.o. male past medical history significant for type 1 diabetes, drug abuse, heroin use, hypertension presents to emergency department today via EMS with chief complaint of hyperglycemia.  Patient states he felt like his blood sugar was high and he did not have insulin since yesterday at his ED visit.  He had to sleep outside in the rain and is now cold.  He does admit to polyuria that is unchanged today.  He denies any fever, chills, chest pain, shortness of breath, abdominal pain, nausea, vomiting, diarrhea. History provided by patient with additional history obtained from chart review.       Past Medical History:  Diagnosis Date  . DEPRESSION   . DIABETES MELLITUS, TYPE I   . DRUG ABUSE    pt should have NO controlled substances rx'ed  . Glaucoma   . HEPATITIS C    chronic  . Heroin overdose ([place]) 10/03/2018  . Heroin use 10/03/2018  . Hypertension 02/18/2011  . 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 Vitiligo     Patient Active Problem List   Diagnosis Date Noted  . Adjustment disorder with mixed disturbance of emotions and conduct   . Sepsis ([place]) 11/11/2018  . Hyperkalemia 11/11/2018  . Esophageal dysphagia 10/13/2018  . Acute esophagitis 10/13/2018  . Loose stools 10/13/2018  . Routine general medical examination at a health care facility 10/13/2018  . Left lower lobe pneumonia 10/07/2018  . Acute respiratory failure ([place])   . AMS (altered mental status)   . Gastroesophageal reflux disease   . Hallucinations   . Hypoglycemia associated with diabetes ([place]) 08/12/2018  . At risk for adverse drug event 02/17/2018  . Abrasions of multiple sites   . Demand ischemia of myocardium ([place]) 02/05/2018  . Acute metabolic encephalopathy  74/82/7078  . DKA (diabetic ketoacidoses) ([place]) 02/03/2018  . Leukocytosis 02/03/2018  . Hypoglycemia 12/24/2017  . Syncope 12/24/2017  . Syncope and collapse 12/23/2017  . IV drug abuse ([place]) 04/19/2017  . Acute encephalopathy 04/19/2017  . Hemangioma 10/02/2016  . Foreign body (FB) in soft tissue   . 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[place])   . Gangrene ([place])   . Type I (juvenile type) diabetes mellitus without mention of complication, not stated as uncontrolled   . Tobacco abuse   . Essential hypertension   . Diabetic infection of right foot ([place]) 07/22/2016  . Diabetic foot infection ([place]) 07/22/2016  . Chronic ulcer of heel, right, with fat layer exposed ([place]) 05/20/2016  . DKA, type 1 ([place]) 04/18/2016  . AKI (acute kidney injury) ([place]) 04/18/2016  . Hyponatremia 04/18/2016  . Severe protein-calorie malnutrition ([place]) 04/18/2016  . Elevated troponin 04/18/2016  . Poor dentition 07/12/2015  . Tobacco use 07/06/2013  . PAD (peripheral artery disease) ([place]) 04/29/2013  . COPD, mild ([place])   . 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 ([place]) 11/18/2011  . Diabetic neuropathy ([place]) 06/21/2010  . PROLIFERATIVE DIABETIC RETINOPATHY 06/21/2010  . SKIN TAG 01/30/2010  . Chronic hepatitis C ([place]) 11/22/2008  . VITILIGO 11/22/2008  . PROTEINURIA, MILD 11/22/2008  . Substance abuse ([place]) 04/23/2007  . DEPRESSION 11/11/2006    Past Surgical History:  Procedure Laterality Date  . ABDOMINAL AORTOGRAM W/LOWER EXTREMITY N/A 07/25/2016  Procedure: Abdominal Aortogram w/Bilateral Lower Extremity Runoff;  Surgeon: Conrad Gladstone, MD;  Location: [facility];  Service: Cardiovascular;  Laterality: N/A;  . ABDOMINAL AORTOGRAM W/LOWER EXTREMITY N/A 12/24/2016   Procedure: ABDOMINAL AORTOGRAM W/LOWER EXTREMITY;  Surgeon: Serafina Mitchell, MD;  Location: [facility];  Service: Cardiovascular;  Laterality: N/A;  . AMPUTATION TOE Right 07/26/2016   Procedure:  AMPUTATION GREAT TOE;  Surgeon: Serafina Mitchell, MD;  Location: [facility];  Service: Vascular;  Laterality: Right;  . BIOPSY  08/25/2018   Procedure: BIOPSY;  Surgeon: Irving Copas., MD;  Location: [facility];  Service: Gastroenterology;;  . BIOPSY  11/16/2018   Procedure: BIOPSY;  Surgeon: Jerene Bears, MD;  [facility]COPY;  Service: Gastroenterology;;  . ESOPHAGOGASTRODUODENOSCOPY N/A 08/25/2018   Procedure: ESOPHAGOGASTRODUODENOSCOPY (EGD);  Surgeon: Irving Copas., MD;  Location: [facility];  Service: Gastroenterology;  Laterality: N/A;  . ESOPHAGOGASTRODUODENOSCOPY (EGD) WITH PROPOFOL N/A 11/16/2018   Procedure: ESOPHAGOGASTRODUODENOSCOPY (EGD) WITH PROPOFOL;  Surgeon: Jerene Bears, MD;  Location: [facility] ENDOSCOPY;  Service: Gastroenterology;  Laterality: N/A;  . EYE SURGERY     retinal surgery x 2, right eye  . INSERTION OF ILIAC STENT Right 07/26/2016   Procedure: INSERTION OF RIGHT POPLITEAL STENT WITH BALLOON ANGIOPLASTY;  Surgeon: Serafina Mitchell, MD;  Location: [facility];  Service: Vascular;  Laterality: Right;  . LOWER EXTREMITY ANGIOGRAM Right 07/26/2016   Procedure: LOWER EXTREMITY ANGIOGRAM;  Surgeon: Serafina Mitchell, MD;  Location: [facility];  Service: Vascular;  Laterality: Right;  . PERIPHERAL VASCULAR BALLOON ANGIOPLASTY Right 07/26/2016   Procedure: PERIPHERAL VASCULAR BALLOON ANGIOPLASTY  RIGHT ANTERIOR TIBEAL ARTERY AND RIGHT SUPERFICIAL FEMORAL ARTERY;  Surgeon: Serafina Mitchell, MD;  Location: [facility];  Service: Vascular;  Laterality: Right;  . PERIPHERAL VASCULAR INTERVENTION Right 12/24/2016   Procedure: PERIPHERAL VASCULAR INTERVENTION;  Surgeon: Serafina Mitchell, MD;  Location: [facility];  Service: Cardiovascular;  Laterality: Right;  lower extr        Home Medications    Prior to Admission medications   Medication Sig Start Date End Date Taking? Authorizing Provider  Accu-Chek Softclix Lancets lancets Use to monitor glucose levels BID; E10.29  08/19/18   Renato Shin, MD  Blood Glucose Calibration (ACCU-CHEK AVIVA) SOLN 1 each by In Vitro route as needed. Use to calibrate meter prn 08/19/18   Renato Shin, MD  blood glucose meter kit and supplies KIT Dispense based on patient and insurance preference. Use up to four times daily as directed. (FOR ICD-9 250.00, 250.01). 08/15/18   Cristal Ford, DO  Blood Glucose Monitoring Suppl (ACCU-CHEK AVIVA PLUS) w/Device KIT 1 each by Does not apply route 2 (two) times a day. Use to monitor glucose levels BID; E10.29 08/19/18   Renato Shin, MD  diclofenac sodium (VOLTAREN) 1 % GEL Apply 2 g topically 4 (four) times daily. 11/24/18   Mariel Aloe, MD  feeding supplement, GLUCERNA SHAKE, (GLUCERNA SHAKE) LIQD Take 237 mLs by mouth 3 (three) times daily between meals. 11/24/18   Mariel Aloe, MD  folic acid (FOLVITE) 1 MG tablet Take 1 tablet (1 mg total) by mouth daily. 09/11/18   Barton Dubois, MD  gabapentin (NEURONTIN) 600 MG tablet Take 600 mg by mouth 3 (three) times daily. 02/09/19   [provider]  glucose blood (ACCU-CHEK AVIVA PLUS) test strip Use to monitor glucose levels BID; E10.29 08/19/18   Renato Shin, MD  insulin aspart (NOVOLOG) 100 UNIT/ML injection  Inject 5 Units into the skin daily with lunch. 03/14/19   Charlann Lange, PA-C  insulin aspart protamine- aspart (NOVOLOG MIX 70/30) (70-30) 100 UNIT/ML injection Inject 0.15 mLs (15 Units total) into the skin 2 (two) times daily with a meal. 03/14/19   Upstill, Shari, PA-C  Insulin Pen Needle 31G X 5 MM MISC Use with novolog flex pen 08/15/18   Cristal Ford, DO  Isopropyl Alcohol Wipes 70 % MISC Apply 1 each topically daily as needed. 02/27/18   Medina-Vargas, Monina C, NP  mirtazapine (REMERON) 15 MG tablet Take 1 tablet (15 mg total) by mouth at bedtime. 11/24/18   Mariel Aloe, MD  Multiple Vitamin (MULTIVITAMIN WITH MINERALS) TABS tablet Take 1 tablet by mouth daily. 11/25/18   Mariel Aloe, MD  opium-belladonna (B&O  SUPPRETTES) 16.2-60 MG suppository Place 1 suppository rectally every 8 (eight) hours as needed for bladder spasms. 11/24/18   Mariel Aloe, MD  pantoprazole (PROTONIX) 40 MG tablet Take 1 tablet (40 mg total) by mouth 2 (two) times daily. 10/13/18   Mansouraty, Telford Nab., MD  polyethylene glycol (MIRALAX / GLYCOLAX) 17 g packet Take 17 g by mouth 2 (two) times daily. 11/24/18   Mariel Aloe, MD  senna-docusate (SENOKOT-S) 8.6-50 MG tablet Take 1 tablet by mouth 2 (two) times daily. 11/24/18   Mariel Aloe, MD  tamsulosin (FLOMAX) 0.4 MG CAPS capsule Take 1 capsule (0.4 mg total) by mouth daily after supper. Patient not taking: Reported on 11/11/2018 10/11/18   Annita Brod, MD  traMADol (ULTRAM) 50 MG tablet Take 1 tablet (50 mg total) by mouth every 12 (twelve) hours as needed for moderate pain. 11/24/18   Mariel Aloe, MD    Family History Family History  Problem Relation Age of Onset  . Diabetes Father   . Kidney disease Father   . Arthritis Mother   . Heart disease Mother        CAD  . Colon cancer Neg Hx   . Esophageal cancer Neg Hx   . Inflammatory bowel disease Neg Hx   . Liver disease Neg Hx   . Pancreatic cancer Neg Hx   . Rectal cancer Neg Hx   . Stomach cancer Neg Hx     Social History Social History   Tobacco Use  . Smoking status: Current Every Day Smoker    Packs/day: 1.00    Years: 51.00    Pack years: 51.00    Types: Cigarettes  . Smokeless tobacco: Former Systems developer    Types: Chew    Quit date: 1985  Substance Use Topics  . Alcohol use: No    Alcohol/week: 0.0 standard drinks    Comment: 08/14/14 none  . Drug use: Yes    Types: Marijuana, Heroin, Cocaine, "Crack" cocaine, Fentanyl, Methylphenidate    Comment: s/p rehab x 6, occasional drug use (per pt last  use was 06/2015)     Allergies   Codeine   Review of Systems Review of Systems All other systems are reviewed and are negative for acute change except as noted in the HPI.   Physical  Exam Updated Vital Signs BP (!) 149/77   Pulse 78   Temp 98 F (36.7 C) (Oral)   Resp 18   Ht 6' (1.829 m)   Wt 74.8 kg Comment: as documented 03/14/19  SpO2 98%   BMI 22.38 kg/m   Physical Exam Vitals signs and nursing note reviewed.  Constitutional:  General: He is not in acute distress.    Appearance: He is not ill-appearing.     Comments: Patient is disheveled  HENT:     Head: Normocephalic and atraumatic.     Right Ear: Tympanic membrane and external ear normal.     Left Ear: Tympanic membrane and external ear normal.     Nose: Nose normal.     Mouth/Throat:     Mouth: Mucous membranes are moist.     Pharynx: Oropharynx is clear.  Eyes:     General: No scleral icterus.       Right eye: No discharge.        Left eye: No discharge.     Extraocular Movements: Extraocular movements intact.     Conjunctiva/sclera: Conjunctivae normal.     Pupils: Pupils are equal, round, and reactive to light.  Neck:     Musculoskeletal: Normal range of motion.     Vascular: No JVD.  Cardiovascular:     Rate and Rhythm: Normal rate and regular rhythm.     Pulses: Normal pulses.          Radial pulses are 2+ on the right side and 2+ on the left side.     Heart sounds: Normal heart sounds.  Pulmonary:     Comments: Lungs clear to auscultation in all fields. Symmetric chest rise. No wheezing, rales, or rhonchi. Abdominal:     Comments: Abdomen is soft, non-distended, and non-tender in all quadrants. No rigidity, no guarding. No peritoneal signs.  Musculoskeletal: Normal range of motion.  Skin:    General: Skin is warm and dry.     Capillary Refill: Capillary refill takes less than 2 seconds.  Neurological:     Mental Status: He is oriented to person, place, and time.     GCS: GCS eye subscore is 4. GCS verbal subscore is 5. GCS motor subscore is 6.     Comments: Fluent speech, no facial droop.  Psychiatric:        Behavior: Behavior normal.      ED Treatments / Results   Labs (all labs ordered are listed, but only abnormal results are displayed) Labs Reviewed  BASIC METABOLIC PANEL - Abnormal; Notable for the following components:      Result Value   Glucose, Bld 434 (*)    Creatinine, Ser 1.41 (*)    GFR calc non Af Amer 53 (*)    All other components within normal limits  CBC - Abnormal; Notable for the following components:   WBC 10.8 (*)    Platelets 473 (*)    All other components within normal limits  CBG MONITORING, ED - Abnormal; Notable for the following components:   Glucose-Capillary 461 (*)    All other components within normal limits  CBG MONITORING, ED - Abnormal; Notable for the following components:   Glucose-Capillary 249 (*)    All other components within normal limits  URINALYSIS, ROUTINE W REFLEX MICROSCOPIC    EKG None  Radiology No results found.  Procedures Procedures (including critical care time)  Medications Ordered in ED Medications  sodium chloride 0.9 % bolus 1,000 mL (0 mLs Intravenous Stopped 03/15/19 1626)  insulin aspart protamine- aspart (NOVOLOG MIX 70/30) injection 15 Units (15 Units Subcutaneous Given 03/15/19 1544)     Initial Impression / Assessment and Plan / ED Course  I have reviewed the triage vital signs and the nursing notes.  Pertinent labs & imaging results that were available during my care  of the patient were reviewed by me and considered in my medical decision making (see chart for details).  Patient multiple ED visits recently. He is disheveled.  He is in no acute distress, nontoxic appearing.  Lungs are clear to auscultation all fields.  Abdomen is nontender, no peritoneal signs. Labs today show hyperglycemia at 434, anion gap within normal range at 10, not suggestive of DKA.  No severe electrolyte derangement.  Mild leukocytosis at 10.8, patient has no infectious symptoms and is afebrile.  Kidney function is improved from compared to 2 days ago, slight elevation of creatinine at 1.14.  Patient given IVF  and home dose of subcu insulin. Recheck glucose is 249.  Social work consulted as patient has had multiple visits and is requesting help getting into a shelter.  Patient seen by social worker Roderic Palau Riffey who advises patient to call his son for a ride and gave patient resources for shelter placement.  The patient appears reasonably screened and/or stabilized for discharge and I doubt any other medical condition or other [facility] requiring further screening, evaluation, or treatment in the ED at this time prior to discharge. The patient is safe for discharge with strict return precautions discussed. Findings and plan of care discussed with supervising physician Dr. Regenia Skeeter.   Portions of this note were generated with Lobbyist. Dictation errors may occur despite best attempts at proofreading.    Final Clinical Impressions(s) / ED Diagnoses   Final diagnoses:  Hyperglycemia  Homelessness    ED Discharge Orders    None       Flint Melter 03/15/19 2025    Sherwood Gambler, MD 03/16/19 [phone]

## 2019-03-15 NOTE — Progress Notes (Addendum)
CSW provided pt with the following note: Per a TOC CSW/CM email sent out recently which talked about one phone number for shelter referrals through [facility] ([place])   Per the email, [place] [facility] is requiring people to quarantine at a hotel before they can go to an open bed at a shelter, but they set that up as well if the pt calls and requests a telephone assessment.   Per [facility], the earlier in the day the pt calls the better. They are not open on weekends, but the phone number provided is: [phone].    Pt is aware and voiced understanding.  CSW provided pt with his son's number from there pt's last visit but per the RN, that number is disconnected.  CSW attempted to call [facility] in [name] but they are no longer answering, but CSW was aware they are not taking pt's.  However, the VM which was updated for [facility] was changed, likely due to the lowered temperature for tonight.  The VM at [facility] states that if a patient has a valid ID they will just need to arrive to [address] with a valid ID.  CSW will provide pt with a taxi voucher to [facility].  CSW called the transportation services ([employer]) at ph: [phone] X2 and texted but received no response.  Per [employer] a voucher would be $42.  Pt's RN stated she would ready pt for D/C (pt had been incontinent and presented as not wanting to leave, per staff) and let the CSW know when to call the taxi.  CSW will continue to follow for D/C needs.  Alphonse Guild. Gabriella Woodhead, LCSW, LCAS, CSI Transitions of Care Clinical Social Worker Care Coordination Department Ph: [phone]

## 2019-03-15 NOTE — ED Notes (Signed)
Social worker Roderic Palau got pt placement for the night and is calling a cab

## 2019-03-15 NOTE — ED Notes (Signed)
Spoke with social work, trying get placement for pt

## 2019-03-15 NOTE — ED Notes (Signed)
Pt has wheelchair and belongings at bedside.

## 2019-03-15 NOTE — Progress Notes (Signed)
Consult request has been received. CSW attempting to follow up at present time.  PER EPD, pt states he won't leave and that if D/C'd he will return if he doesn't, "speak to social work".  Pt is well-know to this CSW from a previous vist in April 2020.  Pt's son's # is: ph: [phone], per the chart.  CSW will continue to follow for D/C needs.  [name]. Jase Reep, LCSW, LCAS, CSI Transitions of Care Clinical Social Worker Care Coordination Department Ph: [phone]

## 2019-03-15 NOTE — ED Triage Notes (Signed)
Per EMS pt wants to be seen for elevated blood sugar; has not had his insulin since last seen here yesterday.

## 2019-03-15 NOTE — Discharge Instructions (Addendum)
Return to the emergency department for any new or worsening symptoms.  It is important that you take your medications as prescribed.  Please call the number Roderic Palau gave you tomorrow between the hours of 9 and 5 to have screening for shelter placement.

## 2019-03-16 DIAGNOSIS — R5381 Other malaise: Secondary | ICD-10-CM | POA: Diagnosis not present

## 2019-03-16 DIAGNOSIS — Z590 Homelessness: Secondary | ICD-10-CM | POA: Diagnosis not present

## 2019-03-16 DIAGNOSIS — R680 Hypothermia, not associated with low environmental temperature: Secondary | ICD-10-CM | POA: Diagnosis not present

## 2019-03-29 DIAGNOSIS — I10 Essential (primary) hypertension: Secondary | ICD-10-CM | POA: Diagnosis not present

## 2019-03-29 DIAGNOSIS — E161 Other hypoglycemia: Secondary | ICD-10-CM | POA: Diagnosis not present

## 2019-03-29 DIAGNOSIS — R4020 Unspecified coma: Secondary | ICD-10-CM | POA: Diagnosis not present

## 2019-03-29 DIAGNOSIS — E162 Hypoglycemia, unspecified: Secondary | ICD-10-CM | POA: Diagnosis not present

## 2019-03-30 ENCOUNTER — Inpatient Hospital Stay (HOSPITAL_COMMUNITY)
Admission: EM | Admit: 2019-03-30 | Discharge: 2019-04-23 | DRG: 871 | Disposition: A | Discharge status: Home / Self Care | Payer: Medicare HMO | Attending: Internal Medicine | Admitting: Internal Medicine

## 2019-03-30 ENCOUNTER — Emergency Department (HOSPITAL_COMMUNITY): Payer: Medicare HMO

## 2019-03-30 DIAGNOSIS — E103599 Type 1 diabetes mellitus with proliferative diabetic retinopathy without macular edema, unspecified eye: Secondary | ICD-10-CM | POA: Diagnosis present

## 2019-03-30 DIAGNOSIS — Z720 Tobacco use: Secondary | ICD-10-CM | POA: Diagnosis not present

## 2019-03-30 DIAGNOSIS — N183 Chronic kidney disease, stage 3 unspecified: Secondary | ICD-10-CM | POA: Diagnosis not present

## 2019-03-30 DIAGNOSIS — H6691 Otitis media, unspecified, right ear: Secondary | ICD-10-CM | POA: Diagnosis present

## 2019-03-30 DIAGNOSIS — F17210 Nicotine dependence, cigarettes, uncomplicated: Secondary | ICD-10-CM | POA: Diagnosis present

## 2019-03-30 DIAGNOSIS — F1490 Cocaine use, unspecified, uncomplicated: Secondary | ICD-10-CM | POA: Diagnosis present

## 2019-03-30 DIAGNOSIS — Z23 Encounter for immunization: Secondary | ICD-10-CM | POA: Diagnosis present

## 2019-03-30 DIAGNOSIS — K222 Esophageal obstruction: Secondary | ICD-10-CM | POA: Diagnosis present

## 2019-03-30 DIAGNOSIS — J9811 Atelectasis: Secondary | ICD-10-CM | POA: Diagnosis not present

## 2019-03-30 DIAGNOSIS — E1165 Type 2 diabetes mellitus with hyperglycemia: Secondary | ICD-10-CM | POA: Diagnosis not present

## 2019-03-30 DIAGNOSIS — D649 Anemia, unspecified: Secondary | ICD-10-CM | POA: Diagnosis present

## 2019-03-30 DIAGNOSIS — Z794 Long term (current) use of insulin: Secondary | ICD-10-CM

## 2019-03-30 DIAGNOSIS — D509 Iron deficiency anemia, unspecified: Secondary | ICD-10-CM | POA: Diagnosis not present

## 2019-03-30 DIAGNOSIS — H66001 Acute suppurative otitis media without spontaneous rupture of ear drum, right ear: Secondary | ICD-10-CM | POA: Diagnosis not present

## 2019-03-30 DIAGNOSIS — G479 Sleep disorder, unspecified: Secondary | ICD-10-CM | POA: Diagnosis not present

## 2019-03-30 DIAGNOSIS — F329 Major depressive disorder, single episode, unspecified: Secondary | ICD-10-CM | POA: Diagnosis present

## 2019-03-30 DIAGNOSIS — J189 Pneumonia, unspecified organism: Secondary | ICD-10-CM | POA: Diagnosis not present

## 2019-03-30 DIAGNOSIS — F1190 Opioid use, unspecified, uncomplicated: Secondary | ICD-10-CM | POA: Diagnosis present

## 2019-03-30 DIAGNOSIS — Z20828 Contact with and (suspected) exposure to other viral communicable diseases: Secondary | ICD-10-CM | POA: Diagnosis not present

## 2019-03-30 DIAGNOSIS — A419 Sepsis, unspecified organism: Secondary | ICD-10-CM | POA: Diagnosis not present

## 2019-03-30 DIAGNOSIS — B182 Chronic viral hepatitis C: Secondary | ICD-10-CM | POA: Diagnosis present

## 2019-03-30 DIAGNOSIS — J449 Chronic obstructive pulmonary disease, unspecified: Secondary | ICD-10-CM | POA: Diagnosis not present

## 2019-03-30 DIAGNOSIS — R531 Weakness: Secondary | ICD-10-CM | POA: Diagnosis not present

## 2019-03-30 DIAGNOSIS — E1069 Type 1 diabetes mellitus with other specified complication: Secondary | ICD-10-CM | POA: Diagnosis present

## 2019-03-30 DIAGNOSIS — E1022 Type 1 diabetes mellitus with diabetic chronic kidney disease: Secondary | ICD-10-CM | POA: Diagnosis present

## 2019-03-30 DIAGNOSIS — Z20822 Contact with and (suspected) exposure to covid-19: Secondary | ICD-10-CM | POA: Diagnosis present

## 2019-03-30 DIAGNOSIS — K2211 Ulcer of esophagus with bleeding: Secondary | ICD-10-CM | POA: Diagnosis present

## 2019-03-30 DIAGNOSIS — H6690 Otitis media, unspecified, unspecified ear: Secondary | ICD-10-CM | POA: Diagnosis present

## 2019-03-30 DIAGNOSIS — F1011 Alcohol abuse, in remission: Secondary | ICD-10-CM | POA: Diagnosis present

## 2019-03-30 DIAGNOSIS — IMO0002 Reserved for concepts with insufficient information to code with codable children: Secondary | ICD-10-CM | POA: Diagnosis present

## 2019-03-30 DIAGNOSIS — N185 Chronic kidney disease, stage 5: Secondary | ICD-10-CM | POA: Diagnosis not present

## 2019-03-30 DIAGNOSIS — E1042 Type 1 diabetes mellitus with diabetic polyneuropathy: Secondary | ICD-10-CM | POA: Diagnosis present

## 2019-03-30 DIAGNOSIS — R0902 Hypoxemia: Secondary | ICD-10-CM | POA: Diagnosis not present

## 2019-03-30 DIAGNOSIS — K7401 Hepatic fibrosis, early fibrosis: Secondary | ICD-10-CM | POA: Diagnosis present

## 2019-03-30 DIAGNOSIS — E10649 Type 1 diabetes mellitus with hypoglycemia without coma: Secondary | ICD-10-CM | POA: Diagnosis present

## 2019-03-30 DIAGNOSIS — K219 Gastro-esophageal reflux disease without esophagitis: Secondary | ICD-10-CM | POA: Diagnosis present

## 2019-03-30 DIAGNOSIS — D631 Anemia in chronic kidney disease: Secondary | ICD-10-CM | POA: Diagnosis present

## 2019-03-30 DIAGNOSIS — D500 Iron deficiency anemia secondary to blood loss (chronic): Secondary | ICD-10-CM | POA: Diagnosis not present

## 2019-03-30 DIAGNOSIS — E1065 Type 1 diabetes mellitus with hyperglycemia: Secondary | ICD-10-CM | POA: Diagnosis not present

## 2019-03-30 DIAGNOSIS — K228 Other specified diseases of esophagus: Secondary | ICD-10-CM | POA: Diagnosis not present

## 2019-03-30 DIAGNOSIS — R1314 Dysphagia, pharyngoesophageal phase: Secondary | ICD-10-CM | POA: Diagnosis present

## 2019-03-30 DIAGNOSIS — K2210 Ulcer of esophagus without bleeding: Secondary | ICD-10-CM | POA: Diagnosis not present

## 2019-03-30 DIAGNOSIS — Z885 Allergy status to narcotic agent status: Secondary | ICD-10-CM

## 2019-03-30 DIAGNOSIS — Z5900 Homelessness unspecified: Secondary | ICD-10-CM

## 2019-03-30 DIAGNOSIS — N179 Acute kidney failure, unspecified: Secondary | ICD-10-CM | POA: Diagnosis not present

## 2019-03-30 DIAGNOSIS — Z79899 Other long term (current) drug therapy: Secondary | ICD-10-CM

## 2019-03-30 DIAGNOSIS — R0602 Shortness of breath: Secondary | ICD-10-CM

## 2019-03-30 DIAGNOSIS — K2101 Gastro-esophageal reflux disease with esophagitis, with bleeding: Secondary | ICD-10-CM | POA: Diagnosis present

## 2019-03-30 DIAGNOSIS — E785 Hyperlipidemia, unspecified: Secondary | ICD-10-CM | POA: Diagnosis present

## 2019-03-30 DIAGNOSIS — H409 Unspecified glaucoma: Secondary | ICD-10-CM | POA: Diagnosis present

## 2019-03-30 DIAGNOSIS — R5381 Other malaise: Secondary | ICD-10-CM | POA: Diagnosis not present

## 2019-03-30 DIAGNOSIS — Z8249 Family history of ischemic heart disease and other diseases of the circulatory system: Secondary | ICD-10-CM

## 2019-03-30 DIAGNOSIS — E1029 Type 1 diabetes mellitus with other diabetic kidney complication: Secondary | ICD-10-CM | POA: Diagnosis present

## 2019-03-30 DIAGNOSIS — Z9119 Patient's noncompliance with other medical treatment and regimen: Secondary | ICD-10-CM

## 2019-03-30 DIAGNOSIS — Z841 Family history of disorders of kidney and ureter: Secondary | ICD-10-CM

## 2019-03-30 DIAGNOSIS — R1310 Dysphagia, unspecified: Secondary | ICD-10-CM

## 2019-03-30 DIAGNOSIS — I10 Essential (primary) hypertension: Secondary | ICD-10-CM | POA: Diagnosis not present

## 2019-03-30 DIAGNOSIS — Z833 Family history of diabetes mellitus: Secondary | ICD-10-CM

## 2019-03-30 DIAGNOSIS — Z89411 Acquired absence of right great toe: Secondary | ICD-10-CM

## 2019-03-30 DIAGNOSIS — E871 Hypo-osmolality and hyponatremia: Secondary | ICD-10-CM | POA: Diagnosis not present

## 2019-03-30 DIAGNOSIS — R918 Other nonspecific abnormal finding of lung field: Secondary | ICD-10-CM | POA: Diagnosis not present

## 2019-03-30 DIAGNOSIS — L80 Vitiligo: Secondary | ICD-10-CM | POA: Diagnosis present

## 2019-03-30 DIAGNOSIS — E1010 Type 1 diabetes mellitus with ketoacidosis without coma: Secondary | ICD-10-CM

## 2019-03-30 LAB — COMPREHENSIVE METABOLIC PANEL
ALT: 14 U/L (ref 0–44)
AST: 16 U/L (ref 15–41)
Albumin: 1.9 g/dL — ABNORMAL LOW (ref 3.5–5.0)
Alkaline Phosphatase: 94 U/L (ref 38–126)
Anion gap: 10 (ref 5–15)
BUN: 15 mg/dL (ref 8–23)
CO2: 25 mmol/L (ref 22–32)
Calcium: 8.2 mg/dL — ABNORMAL LOW (ref 8.9–10.3)
Chloride: 98 mmol/L (ref 98–111)
Creatinine, Ser: 1.5 mg/dL — ABNORMAL HIGH (ref 0.61–1.24)
GFR calc Af Amer: 57 mL/min — ABNORMAL LOW (ref >60)
GFR calc non Af Amer: 49 mL/min — ABNORMAL LOW (ref >60)
Glucose, Bld: 102 mg/dL — ABNORMAL HIGH (ref 70–99)
Potassium: 3.7 mmol/L (ref 3.5–5.1)
Sodium: 133 mmol/L — ABNORMAL LOW (ref 135–145)
Total Bilirubin: 0.5 mg/dL (ref 0.3–1.2)
Total Protein: 6.1 g/dL — ABNORMAL LOW (ref 6.5–8.1)

## 2019-03-30 LAB — CBC WITH DIFFERENTIAL/PLATELET
Abs Immature Granulocytes: 0.19 10*3/uL — ABNORMAL HIGH (ref 0.00–0.07)
Basophils Absolute: 0.1 10*3/uL (ref 0.0–0.1)
Basophils Relative: 0 %
Eosinophils Absolute: 0 10*3/uL (ref 0.0–0.5)
Eosinophils Relative: 0 %
HCT: 31.7 % — ABNORMAL LOW (ref 39.0–52.0)
Hemoglobin: 10.5 g/dL — ABNORMAL LOW (ref 13.0–17.0)
Immature Granulocytes: 1 %
Lymphocytes Relative: 6 %
Lymphs Abs: 1.4 10*3/uL (ref 0.7–4.0)
MCH: 26.9 pg (ref 26.0–34.0)
MCHC: 33.1 g/dL (ref 30.0–36.0)
MCV: 81.3 fL (ref 80.0–100.0)
Monocytes Absolute: 2.1 10*3/uL — ABNORMAL HIGH (ref 0.1–1.0)
Monocytes Relative: 9 %
Neutro Abs: 18.7 10*3/uL — ABNORMAL HIGH (ref 1.7–7.7)
Neutrophils Relative %: 84 %
Platelets: 394 10*3/uL (ref 150–400)
RBC: 3.9 MIL/uL — ABNORMAL LOW (ref 4.22–5.81)
RDW: 14.5 % (ref 11.5–15.5)
WBC: 22.4 10*3/uL — ABNORMAL HIGH (ref 4.0–10.5)
nRBC: 0 % (ref 0.0–0.2)

## 2019-03-30 LAB — CBG MONITORING, ED: Glucose-Capillary: 76 mg/dL (ref 70–99)

## 2019-03-30 LAB — LACTIC ACID, PLASMA: Lactic Acid, Venous: 1.3 mmol/L (ref 0.5–1.9)

## 2019-03-30 MED ORDER — SODIUM CHLORIDE 0.9% FLUSH
3.0000 mL | Freq: Once | INTRAVENOUS | Status: AC
  Administered 2019-04-04: 3 mL via INTRAVENOUS

## 2019-03-30 NOTE — ED Triage Notes (Signed)
Pt arrives via EMS from home with generalized weakness, decreased oral intake, nausea for the last 3 days. Reports hasn't taken meds today. cbg 97. Vss. Temp 102. Reports some cough. 1000mg  tylenol by EMS

## 2019-03-30 NOTE — ED Notes (Addendum)
Pt states his CBG is low, needs food. States he is going to go in [employer] and start eating everything off the shelves. Advised against this. This RN gave pt sandwich, drink, cheese and crackers

## 2019-03-31 ENCOUNTER — Emergency Department (HOSPITAL_COMMUNITY): Payer: Medicare HMO

## 2019-03-31 DIAGNOSIS — H6690 Otitis media, unspecified, unspecified ear: Secondary | ICD-10-CM | POA: Diagnosis present

## 2019-03-31 DIAGNOSIS — A419 Sepsis, unspecified organism: Secondary | ICD-10-CM | POA: Diagnosis present

## 2019-03-31 DIAGNOSIS — D631 Anemia in chronic kidney disease: Secondary | ICD-10-CM | POA: Diagnosis present

## 2019-03-31 DIAGNOSIS — F17210 Nicotine dependence, cigarettes, uncomplicated: Secondary | ICD-10-CM | POA: Diagnosis present

## 2019-03-31 DIAGNOSIS — R1310 Dysphagia, unspecified: Secondary | ICD-10-CM | POA: Diagnosis not present

## 2019-03-31 DIAGNOSIS — H6691 Otitis media, unspecified, right ear: Secondary | ICD-10-CM | POA: Diagnosis present

## 2019-03-31 DIAGNOSIS — N185 Chronic kidney disease, stage 5: Secondary | ICD-10-CM | POA: Diagnosis not present

## 2019-03-31 DIAGNOSIS — F329 Major depressive disorder, single episode, unspecified: Secondary | ICD-10-CM | POA: Diagnosis present

## 2019-03-31 DIAGNOSIS — K222 Esophageal obstruction: Secondary | ICD-10-CM | POA: Diagnosis present

## 2019-03-31 DIAGNOSIS — J189 Pneumonia, unspecified organism: Secondary | ICD-10-CM | POA: Diagnosis present

## 2019-03-31 DIAGNOSIS — H409 Unspecified glaucoma: Secondary | ICD-10-CM | POA: Diagnosis present

## 2019-03-31 DIAGNOSIS — E1042 Type 1 diabetes mellitus with diabetic polyneuropathy: Secondary | ICD-10-CM | POA: Diagnosis present

## 2019-03-31 DIAGNOSIS — R1314 Dysphagia, pharyngoesophageal phase: Secondary | ICD-10-CM | POA: Diagnosis present

## 2019-03-31 DIAGNOSIS — K7401 Hepatic fibrosis, early fibrosis: Secondary | ICD-10-CM | POA: Diagnosis present

## 2019-03-31 DIAGNOSIS — Z5900 Homelessness unspecified: Secondary | ICD-10-CM

## 2019-03-31 DIAGNOSIS — Z794 Long term (current) use of insulin: Secondary | ICD-10-CM | POA: Diagnosis not present

## 2019-03-31 DIAGNOSIS — B182 Chronic viral hepatitis C: Secondary | ICD-10-CM | POA: Diagnosis present

## 2019-03-31 DIAGNOSIS — E1165 Type 2 diabetes mellitus with hyperglycemia: Secondary | ICD-10-CM | POA: Diagnosis not present

## 2019-03-31 DIAGNOSIS — D500 Iron deficiency anemia secondary to blood loss (chronic): Secondary | ICD-10-CM | POA: Diagnosis not present

## 2019-03-31 DIAGNOSIS — N183 Chronic kidney disease, stage 3 unspecified: Secondary | ICD-10-CM | POA: Diagnosis present

## 2019-03-31 DIAGNOSIS — D649 Anemia, unspecified: Secondary | ICD-10-CM

## 2019-03-31 DIAGNOSIS — H66001 Acute suppurative otitis media without spontaneous rupture of ear drum, right ear: Secondary | ICD-10-CM

## 2019-03-31 DIAGNOSIS — K2101 Gastro-esophageal reflux disease with esophagitis, with bleeding: Secondary | ICD-10-CM | POA: Diagnosis present

## 2019-03-31 DIAGNOSIS — K219 Gastro-esophageal reflux disease without esophagitis: Secondary | ICD-10-CM | POA: Diagnosis present

## 2019-03-31 DIAGNOSIS — K2211 Ulcer of esophagus with bleeding: Secondary | ICD-10-CM | POA: Diagnosis present

## 2019-03-31 DIAGNOSIS — F1490 Cocaine use, unspecified, uncomplicated: Secondary | ICD-10-CM | POA: Diagnosis present

## 2019-03-31 DIAGNOSIS — Z720 Tobacco use: Secondary | ICD-10-CM | POA: Diagnosis not present

## 2019-03-31 DIAGNOSIS — E871 Hypo-osmolality and hyponatremia: Secondary | ICD-10-CM | POA: Diagnosis present

## 2019-03-31 DIAGNOSIS — N179 Acute kidney failure, unspecified: Secondary | ICD-10-CM | POA: Diagnosis present

## 2019-03-31 DIAGNOSIS — Z23 Encounter for immunization: Secondary | ICD-10-CM | POA: Diagnosis present

## 2019-03-31 DIAGNOSIS — K2210 Ulcer of esophagus without bleeding: Secondary | ICD-10-CM | POA: Diagnosis not present

## 2019-03-31 DIAGNOSIS — E1022 Type 1 diabetes mellitus with diabetic chronic kidney disease: Secondary | ICD-10-CM | POA: Diagnosis present

## 2019-03-31 DIAGNOSIS — E1065 Type 1 diabetes mellitus with hyperglycemia: Secondary | ICD-10-CM | POA: Diagnosis present

## 2019-03-31 DIAGNOSIS — Z20822 Contact with and (suspected) exposure to covid-19: Secondary | ICD-10-CM | POA: Diagnosis present

## 2019-03-31 DIAGNOSIS — F1190 Opioid use, unspecified, uncomplicated: Secondary | ICD-10-CM | POA: Diagnosis present

## 2019-03-31 LAB — RESPIRATORY PANEL BY PCR

## 2019-03-31 LAB — URINALYSIS, ROUTINE W REFLEX MICROSCOPIC
Bilirubin Urine: NEGATIVE
Glucose, UA: >=500 mg/dL — AB
Hgb urine dipstick: NEGATIVE
Ketones, ur: NEGATIVE mg/dL
Leukocytes,Ua: NEGATIVE
Nitrite: NEGATIVE
Protein, ur: 100 mg/dL — AB
Specific Gravity, Urine: 1.02 (ref 1.005–1.030)
pH: 6 (ref 5.0–8.0)

## 2019-03-31 LAB — CBG MONITORING, ED
Glucose-Capillary: 529 mg/dL (ref 70–99)
Glucose-Capillary: 506 mg/dL (ref 70–99)
Glucose-Capillary: 461 mg/dL — ABNORMAL HIGH (ref 70–99)

## 2019-03-31 LAB — GLUCOSE, CAPILLARY
Glucose-Capillary: 167 mg/dL — ABNORMAL HIGH (ref 70–99)
Glucose-Capillary: 349 mg/dL — ABNORMAL HIGH (ref 70–99)

## 2019-03-31 LAB — SARS CORONAVIRUS 2 (TAT 6-24 HRS): SARS Coronavirus 2: NEGATIVE

## 2019-03-31 MED ORDER — INSULIN ASPART 100 UNIT/ML ~~LOC~~ SOLN
0.0000 [IU] | Freq: Three times a day (TID) | SUBCUTANEOUS | Status: DC
  Administered 2019-03-31: 14:00:00 15 [IU] via SUBCUTANEOUS

## 2019-03-31 MED ORDER — SODIUM CHLORIDE 0.9 % IV SOLN
500.0000 mg | Freq: Once | INTRAVENOUS | Status: AC
  Administered 2019-03-31: 10:00:00 500 mg via INTRAVENOUS
  Filled 2019-03-31: qty 500

## 2019-03-31 MED ORDER — GUAIFENESIN 100 MG/5ML PO SOLN
10.0000 mL | Freq: Four times a day (QID) | ORAL | Status: DC
  Administered 2019-04-01 – 2019-04-23 (×84): 200 mg via ORAL
  Filled 2019-03-31 (×4): qty 10
  Filled 2019-03-31: qty 5
  Filled 2019-03-31 (×8): qty 10
  Filled 2019-03-31: qty 5
  Filled 2019-03-31 (×3): qty 10
  Filled 2019-03-31: qty 5
  Filled 2019-03-31 (×10): qty 10
  Filled 2019-03-31: qty 5
  Filled 2019-03-31 (×8): qty 10
  Filled 2019-03-31: qty 5
  Filled 2019-03-31 (×6): qty 10
  Filled 2019-03-31: qty 5
  Filled 2019-03-31 (×2): qty 10
  Filled 2019-03-31 (×2): qty 5
  Filled 2019-03-31 (×5): qty 10
  Filled 2019-03-31: qty 5
  Filled 2019-03-31 (×10): qty 10
  Filled 2019-03-31: qty 5
  Filled 2019-03-31: qty 10
  Filled 2019-03-31: qty 5
  Filled 2019-03-31 (×2): qty 10
  Filled 2019-03-31: qty 5
  Filled 2019-03-31 (×2): qty 10
  Filled 2019-03-31: qty 5
  Filled 2019-03-31 (×8): qty 10
  Filled 2019-03-31: qty 5
  Filled 2019-03-31 (×2): qty 10
  Filled 2019-03-31: qty 5
  Filled 2019-03-31 (×2): qty 10
  Filled 2019-03-31: qty 25
  Filled 2019-03-31: qty 5
  Filled 2019-03-31 (×2): qty 10
  Filled 2019-03-31: qty 5

## 2019-03-31 MED ORDER — DOXYCYCLINE HYCLATE 100 MG PO TABS
100.0000 mg | ORAL_TABLET | Freq: Once | ORAL | Status: AC
  Administered 2019-03-31: 04:00:00 100 mg via ORAL
  Filled 2019-03-31: qty 1

## 2019-03-31 MED ORDER — ENOXAPARIN SODIUM 40 MG/0.4ML ~~LOC~~ SOLN
40.0000 mg | SUBCUTANEOUS | Status: DC
  Administered 2019-03-31 – 2019-04-20 (×21): 40 mg via SUBCUTANEOUS
  Filled 2019-03-31 (×22): qty 0.4

## 2019-03-31 MED ORDER — INSULIN ASPART 100 UNIT/ML ~~LOC~~ SOLN
0.0000 [IU] | Freq: Once | SUBCUTANEOUS | Status: AC
  Administered 2019-03-31: 20:00:00 11 [IU] via SUBCUTANEOUS

## 2019-03-31 MED ORDER — INSULIN GLARGINE 100 UNIT/ML ~~LOC~~ SOLN
10.0000 [IU] | Freq: Every day | SUBCUTANEOUS | Status: DC
  Administered 2019-03-31: 12:00:00 10 [IU] via SUBCUTANEOUS
  Filled 2019-03-31 (×2): qty 0.1

## 2019-03-31 MED ORDER — SODIUM CHLORIDE 0.9 % IV SOLN
500.0000 mg | INTRAVENOUS | Status: AC
  Administered 2019-04-01 – 2019-04-04 (×4): 500 mg via INTRAVENOUS
  Filled 2019-03-31 (×4): qty 500

## 2019-03-31 MED ORDER — IOHEXOL 300 MG/ML  SOLN
75.0000 mL | Freq: Once | INTRAMUSCULAR | Status: AC | PRN
  Administered 2019-03-31: 04:00:00 75 mL via INTRAVENOUS

## 2019-03-31 MED ORDER — ACETAMINOPHEN 500 MG PO TABS
1000.0000 mg | ORAL_TABLET | Freq: Once | ORAL | Status: AC
  Administered 2019-03-31: 07:00:00 1000 mg via ORAL
  Filled 2019-03-31: qty 2

## 2019-03-31 MED ORDER — TRAMADOL HCL 50 MG PO TABS
50.0000 mg | ORAL_TABLET | Freq: Once | ORAL | Status: AC
  Administered 2019-03-31: 21:00:00 50 mg via ORAL
  Filled 2019-03-31: qty 1

## 2019-03-31 MED ORDER — SODIUM CHLORIDE 0.9 % IV SOLN
2.0000 g | INTRAVENOUS | Status: AC
  Administered 2019-04-01 – 2019-04-04 (×4): 2 g via INTRAVENOUS
  Filled 2019-03-31: qty 2
  Filled 2019-03-31 (×3): qty 20
  Filled 2019-03-31: qty 2

## 2019-03-31 MED ORDER — INSULIN ASPART 100 UNIT/ML ~~LOC~~ SOLN
0.0000 [IU] | SUBCUTANEOUS | Status: DC
  Administered 2019-03-31 – 2019-04-01 (×3): 3 [IU] via SUBCUTANEOUS
  Administered 2019-04-01 (×3): 11 [IU] via SUBCUTANEOUS
  Administered 2019-04-01: 2 [IU] via SUBCUTANEOUS
  Administered 2019-04-02: 04:00:00 5 [IU] via SUBCUTANEOUS

## 2019-03-31 MED ORDER — GUAIFENESIN ER 600 MG PO TB12
600.0000 mg | ORAL_TABLET | Freq: Two times a day (BID) | ORAL | Status: DC
  Filled 2019-03-31: qty 1

## 2019-03-31 MED ORDER — SODIUM CHLORIDE 0.9 % IV SOLN
1.0000 g | Freq: Once | INTRAVENOUS | Status: AC
  Administered 2019-03-31: 07:00:00 1 g via INTRAVENOUS
  Filled 2019-03-31: qty 10

## 2019-03-31 NOTE — ED Notes (Signed)
Unsuccessful attempt at drawing blood. Phlebotomy notified to draw labs.

## 2019-03-31 NOTE — ED Notes (Signed)
Patient removed all monitoring equipment. 

## 2019-03-31 NOTE — ED Notes (Signed)
Report given to [employer]

## 2019-03-31 NOTE — Progress Notes (Signed)
During shift change, patient requesting to shower.  Informed patient a order was needed in order to take a shower. [facility] notified of patient request.

## 2019-03-31 NOTE — ED Notes (Signed)
Pt ambulated self efficiently to restroom with no difficulty. Pt returned safely to bedside. 

## 2019-03-31 NOTE — ED Notes (Signed)
Upon this Rn entering the room, pt sitting in chair with all monitoring equipment off, when asked why pt had removed monitoring equipment again, he stated "it just kind of came off."pt instructed importance of keeping monitoring equipment on.

## 2019-03-31 NOTE — ED Notes (Signed)
Patient transported to CT 

## 2019-03-31 NOTE — Progress Notes (Signed)
Patient does not want to wash at the sink and still wanting to take a shower.  RN notified [facility] with patient request.

## 2019-03-31 NOTE — ED Notes (Signed)
Pt provided sandwich and water, pt demanding  Milk, this RN advised pt since he is here for his blood sugar being high, as stated by him, milk will only make his blood sugar higher. This RN gave pt a pint of milk previously at 430 per his request. Pt yelling and cussing at this RN stating "you did not give me any milk, I want some damn milk!"

## 2019-03-31 NOTE — ED Notes (Signed)
Patient returned to room from CT and this RN was advised that IV started prior to this RN assuming care had blown in CT. This RN attempted to site iv on patient without success.   Pt also demanding milk and food, stating his sugar is going up and he has not eaten in days.

## 2019-03-31 NOTE — ED Notes (Signed)
Ordered a carb modified breakfast--Andria Head

## 2019-03-31 NOTE — ED Notes (Signed)
Urine Culture sent with UA 

## 2019-03-31 NOTE — ED Notes (Signed)
Pt up to have bm in bathroom , has a myriad of complaints he wants sugar free popsicle , he has indigestion after ALL of spagg for lunch , he wants to shower , he doe I wouldsent like IV where it is , his feet are cold. I told him  He was going to get a room soon he could shower then , we had no SF popscicles would look for meds for his indigestion, given socks , TV turned on and pt placed back on monitor

## 2019-03-31 NOTE — ED Notes (Signed)
Pt provided something to drink, per his request

## 2019-03-31 NOTE — ED Notes (Signed)
Breakfast tray at bedside 
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
## 2019-03-31 NOTE — ED Notes (Signed)
IV team RN unable to site iv for CTA, Dr. Dayna Barker made aware.

## 2019-03-31 NOTE — ED Notes (Signed)
Monitoring equipment removed by patient again.

## 2019-03-31 NOTE — ED Notes (Signed)
Iv team at bedside  

## 2019-04-01 ENCOUNTER — Other Ambulatory Visit: Payer: Self-pay

## 2019-04-01 ENCOUNTER — Encounter (HOSPITAL_COMMUNITY): Payer: Self-pay | Admitting: Internal Medicine

## 2019-04-01 LAB — BASIC METABOLIC PANEL
Anion gap: 9 (ref 5–15)
BUN: 26 mg/dL — ABNORMAL HIGH (ref 8–23)
CO2: 26 mmol/L (ref 22–32)
Calcium: 8.3 mg/dL — ABNORMAL LOW (ref 8.9–10.3)
Chloride: 97 mmol/L — ABNORMAL LOW (ref 98–111)
Creatinine, Ser: 1.61 mg/dL — ABNORMAL HIGH (ref 0.61–1.24)
GFR calc Af Amer: 52 mL/min — ABNORMAL LOW (ref >60)
GFR calc non Af Amer: 45 mL/min — ABNORMAL LOW (ref >60)
Glucose, Bld: 334 mg/dL — ABNORMAL HIGH (ref 70–99)
Potassium: 4.2 mmol/L (ref 3.5–5.1)
Sodium: 132 mmol/L — ABNORMAL LOW (ref 135–145)

## 2019-04-01 LAB — CBC WITH DIFFERENTIAL/PLATELET
Abs Immature Granulocytes: 0.05 10*3/uL (ref 0.00–0.07)
Basophils Absolute: 0.1 10*3/uL (ref 0.0–0.1)
Basophils Relative: 1 %
Eosinophils Absolute: 0.3 10*3/uL (ref 0.0–0.5)
Eosinophils Relative: 3 %
HCT: 31.2 % — ABNORMAL LOW (ref 39.0–52.0)
Hemoglobin: 10.2 g/dL — ABNORMAL LOW (ref 13.0–17.0)
Immature Granulocytes: 1 %
Lymphocytes Relative: 25 %
Lymphs Abs: 2 10*3/uL (ref 0.7–4.0)
MCH: 26.8 pg (ref 26.0–34.0)
MCHC: 32.7 g/dL (ref 30.0–36.0)
MCV: 81.9 fL (ref 80.0–100.0)
Monocytes Absolute: 0.7 10*3/uL (ref 0.1–1.0)
Monocytes Relative: 9 %
Neutro Abs: 5 10*3/uL (ref 1.7–7.7)
Neutrophils Relative %: 61 %
Platelets: 406 10*3/uL — ABNORMAL HIGH (ref 150–400)
RBC: 3.81 MIL/uL — ABNORMAL LOW (ref 4.22–5.81)
RDW: 14.2 % (ref 11.5–15.5)
WBC: 8.2 10*3/uL (ref 4.0–10.5)
nRBC: 0 % (ref 0.0–0.2)

## 2019-04-01 LAB — GLUCOSE, CAPILLARY
Glucose-Capillary: 147 mg/dL — ABNORMAL HIGH (ref 70–99)
Glucose-Capillary: 170 mg/dL — ABNORMAL HIGH (ref 70–99)
Glucose-Capillary: 198 mg/dL — ABNORMAL HIGH (ref 70–99)
Glucose-Capillary: 302 mg/dL — ABNORMAL HIGH (ref 70–99)
Glucose-Capillary: 315 mg/dL — ABNORMAL HIGH (ref 70–99)
Glucose-Capillary: 339 mg/dL — ABNORMAL HIGH (ref 70–99)
Glucose-Capillary: 45 mg/dL — ABNORMAL LOW (ref 70–99)

## 2019-04-01 LAB — HEMOGLOBIN A1C
Hgb A1c MFr Bld: 9.1 % — ABNORMAL HIGH (ref 4.8–5.6)
Mean Plasma Glucose: 214.47 mg/dL

## 2019-04-01 LAB — PHOSPHORUS: Phosphorus: 2.3 mg/dL — ABNORMAL LOW (ref 2.5–4.6)

## 2019-04-01 LAB — HIV ANTIBODY (ROUTINE TESTING W REFLEX): HIV Screen 4th Generation wRfx: NONREACTIVE

## 2019-04-01 LAB — MAGNESIUM: Magnesium: 1.8 mg/dL (ref 1.7–2.4)

## 2019-04-01 MED ORDER — SODIUM CHLORIDE 0.9 % IV SOLN
INTRAVENOUS | Status: DC | PRN
  Administered 2019-04-02 (×2): 250 mL via INTRAVENOUS

## 2019-04-01 MED ORDER — INSULIN GLARGINE 100 UNIT/ML ~~LOC~~ SOLN
20.0000 [IU] | Freq: Every day | SUBCUTANEOUS | Status: DC
  Administered 2019-04-01 – 2019-04-03 (×3): 20 [IU] via SUBCUTANEOUS
  Filled 2019-04-01 (×4): qty 0.2

## 2019-04-01 MED ORDER — TRAMADOL HCL 50 MG PO TABS
50.0000 mg | ORAL_TABLET | Freq: Once | ORAL | Status: AC
  Administered 2019-04-01: 23:00:00 50 mg via ORAL
  Filled 2019-04-01: qty 1
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
## 2019-04-01 NOTE — Progress Notes (Signed)
Pt keep complaining of not getting enough food and that is why he is always hungry. He keep cursing staffs for not giving the food he wants.

## 2019-04-01 NOTE — Progress Notes (Signed)
Pt refusing staff assistance from the NT while he is urinating.

## 2019-04-01 NOTE — Progress Notes (Signed)
PIV consult: Pt has 20g in L AC that continues to beep occluded. Attempted R forearm site, pt screamed and jerked arm away. Demanded that I stop. Will re-attempt later.
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
## 2019-04-01 NOTE — Progress Notes (Signed)
Patient with complaints of nerve pain and ear ache.  NP with [facility] niotified.

## 2019-04-01 NOTE — NC FL2 (Signed)
[place] LEVEL OF CARE SCREENING TOOL     IDENTIFICATION  Patient Name: [name] Birthdate: 12-15-1955 Sex: male Admission Date (Current Location): 03/30/2019  [facility] and [place] Number:  Herbalist and Address:  [facility], [place] [address] [place] [place], [place] [address]      Provider Number: [id number]  Attending Physician Name and Address:  [place] Eric J, DO  Relative Name and Phone Number:  Gaspar Bidding (son) [phone]    Current Level of Care: Hospital Recommended Level of Care: [place] Prior Approval Number:    Date Approved/Denied:   PASRR Number: [id number]  Discharge Plan: SNF    Current Diagnoses: Patient Active Problem List   Diagnosis Date Noted  . CKD (chronic kidney disease), stage III 03/31/2019  . Normocytic anemia 03/31/2019  . Otitis media 03/31/2019  . Homelessness 03/31/2019  . Adjustment disorder with mixed disturbance of emotions and conduct   . Sepsis due to pneumonia ([place]) 11/11/2018  . Hyperkalemia 11/11/2018  . Esophageal dysphagia 10/13/2018  . Acute esophagitis 10/13/2018  . Loose stools 10/13/2018  . Routine general medical examination at a health care facility 10/13/2018  . Left lower lobe pneumonia 10/07/2018  . Acute respiratory failure ([place])   . AMS (altered mental status)   . Gastroesophageal reflux disease   . Hallucinations   . Hypoglycemia associated with diabetes ([place]) 08/12/2018  . At risk for adverse drug event 02/17/2018  . Abrasions of multiple sites   . Demand ischemia of myocardium ([place]) 02/05/2018  . Acute metabolic encephalopathy 79/39/0300  . DKA (diabetic ketoacidoses) ([place]) 02/03/2018  . Leukocytosis 02/03/2018  . Hypoglycemia 12/24/2017  . Syncope 12/24/2017  . Syncope and collapse 12/23/2017  . IV drug abuse ([place]) 04/19/2017  . Acute encephalopathy 04/19/2017  . Hemangioma 10/02/2016  . Foreign body (FB) in soft tissue   . Osteomyelitis of  toe of right foot ([place])   . Gangrene ([place])   . Type I (juvenile type) diabetes mellitus without mention of complication, not stated as uncontrolled   . Tobacco abuse   . Essential hypertension   . Diabetic infection of right foot ([place]) 07/22/2016  . Diabetic foot infection ([place]) 07/22/2016  . Chronic ulcer of heel, right, with fat layer exposed ([place]) 05/20/2016  . DKA, type 1 ([place]) 04/18/2016  . AKI (acute kidney injury) ([place]) 04/18/2016  . Hyponatremia 04/18/2016  . Severe protein-calorie malnutrition ([place]) 04/18/2016  . Elevated troponin 04/18/2016  . Poor dentition 07/12/2015  . Tobacco use 07/06/2013  . PAD (peripheral artery disease) ([place]) 04/29/2013  . COPD, mild ([place])   . 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 Diabetic neuropathy ([place]) 06/21/2010  . PROLIFERATIVE DIABETIC RETINOPATHY 06/21/2010  . SKIN TAG 01/30/2010  . Chronic hepatitis C ([place]) 11/22/2008  . VITILIGO 11/22/2008  . PROTEINURIA, MILD 11/22/2008  . Substance abuse ([place]) 04/23/2007  . DEPRESSION 11/11/2006    Orientation RESPIRATION BLADDER Height & Weight     Self, Time, Situation, Place  Normal Continent Weight: 148 lb 4.8 oz (67.3 kg) Height:  6' (182.9 cm)  BEHAVIORAL SYMPTOMS/MOOD NEUROLOGICAL BOWEL NUTRITION STATUS      Continent Diet(see discharge summary)  AMBULATORY STATUS COMMUNICATION OF NEEDS Skin   Limited Assist Verbally Other (Comment)(abrasions on knees, amputation left toe)                       Personal Care Assistance Level of Assistance  Bathing, Dressing, Total care, Feeding Bathing Assistance: Limited assistance Feeding assistance: Independent Dressing Assistance: Limited assistance Total Care Assistance: Limited assistance   Functional Limitations Info  Sight, Speech, Hearing Sight Info: Adequate Hearing Info: Adequate Speech Info: Adequate    SPECIAL CARE FACTORS FREQUENCY  PT (By licensed PT), OT (By licensed OT)      PT Frequency: min 5x weekly OT Frequency: min 5x weekly            Contractures Contractures Info: Not present    Additional Factors Info  Code Status, Allergies Code Status Info: full Allergies Info: Codeine           Current Medications (04/01/2019):  This is the current hospital active medication list Current Facility-Administered Medications  Medication Dose Route Frequency Provider Last Rate Last Admin  . 0.9 %  sodium chloride infusion   Intravenous PRN [place] Donnamarie Poag, DO      . azithromycin (ZITHROMAX) 500 mg in sodium chloride 0.9 % 250 mL IVPB  500 mg Intravenous Q24H Fuller Plan A, MD 250 mL/hr at 04/01/19 0921 500 mg at 04/01/19 0921  . cefTRIAXone (ROCEPHIN) 2 g in sodium chloride 0.9 % 100 mL IVPB  2 g Intravenous Q24H Smith, Rondell A, MD 200 mL/hr at 04/01/19 1133 2 g at 04/01/19 1133  . enoxaparin (LOVENOX) injection 40 mg  40 mg Subcutaneous Q24H Tamala Julian, Rondell A, MD   40 mg at 04/01/19 1252  . guaiFENesin (ROBITUSSIN) 100 MG/5ML solution 200 mg  10 mL Oral Q6H Smith, Rondell A, MD   200 mg at 04/01/19 1449  . insulin aspart (novoLOG) injection 0-15 Units  0-15 Units Subcutaneous Q4H Fuller Plan A, MD   3 Units at 04/01/19 1251  . insulin glargine (LANTUS) injection 20 Units  20 Units Subcutaneous Daily [place] Eric J, DO   20 Units at 04/01/19 5300  . sodium chloride flush (NS) 0.9 % injection 3 mL  3 mL Intravenous Once Mesner, Corene Cornea, MD         Discharge Medications: Please see discharge summary for a list of discharge medications.  Relevant Imaging Results:  Relevant Lab Results:   Additional Information SSN: [id number]  Alberteen Sam, LCSW

## 2019-04-01 NOTE — Progress Notes (Signed)
Patient refuses assistance while using urinal.  Pt will allow staff to stand behind curtain and alert when finished with the urinal.
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
## 2019-04-01 NOTE — TOC Initial Note (Addendum)
Transition of Care [facility]) - Initial/Assessment Note    Patient Details  Name: [name] MRN: [id number] Date of Birth: 1955-06-14  Transition of Care [facility]) CM/SW Contact:    Alberteen Sam, [place] Phone Number: [phone] 04/01/2019, 2:58 PM  Clinical Narrative:                  CSW and RNCM spoke with patient regarding dc plan, he is mostly concerned about his wallet and insulin that is at his motel room. He is currently residing at [facility] on [address] in [place] in Room [address]. He reports needing confirmation belongings are safe. Patient desires to dc to SNF, preferably to [facility] where he was residing (admitted 3 months ago).    Patient reports having no medication needs,he has   Insulin at his [employer] [facility] ([facility]us)  CSW called the [employer] [facility] at [phone], they report patient's belongings are safe and that he can pick them up when he is discharged from the hospital, however he cannot resume staying at their hotel.   CSW has sent referral to [facility] with admissions reports she will review referral.   Expected Discharge Plan: Skilled Nursing Facility Barriers to Discharge: Homeless with medical needs, SNF Pending bed offer   Patient Goals and CMS Choice   CMS Medicare.gov Compare Post Acute Care list provided to:: Patient Choice offered to / list presented to : Patient  Expected Discharge Plan and Services Expected Discharge Plan: [place]       Living arrangements for the past 2 months: Hotel/Motel([facility])                                      Prior Living Arrangements/Services Living arrangements for the past 2 months: Hotel/Motel([employer] [facility]) Lives with:: Self Patient language and need for interpreter reviewed:: Yes        Need for Family Participation in Patient Care: No (Comment) Care giver support system in place?: No (comment)   Criminal Activity/Legal Involvement Pertinent to  Current Situation/Hospitalization: No - Comment as needed  Activities of Daily Living Home Assistive Devices/Equipment: None ADL Screening (condition at time of admission) Patient's cognitive ability adequate to safely complete daily activities?: Yes Is the patient deaf or have difficulty hearing?: Yes Does the patient have difficulty seeing, even when wearing glasses/contacts?: No Does the patient have difficulty concentrating, remembering, or making decisions?: No Patient able to express need for assistance with ADLs?: Yes Does the patient have difficulty dressing or bathing?: Yes Independently performs ADLs?: Yes (appropriate for developmental age) Does the patient have difficulty walking or climbing stairs?: Yes Weakness of Legs: Both Weakness of Arms/Hands: None  Permission Sought/Granted                  Emotional Assessment Appearance:: Appears stated age Attitude/Demeanor/Rapport: Complaining Affect (typically observed): Agitated Orientation: : Oriented to Self, Oriented to Place, Oriented to  Time, Oriented to Situation Alcohol / Substance Use: Not Applicable Psych Involvement: No (comment)  Admission diagnosis:  Pneumonia [J18.9] SOB (shortness of breath) [R06.02] CAP (community acquired pneumonia) [J18.9] Community acquired pneumonia of right middle lobe of lung [J18.9] Sepsis, due to unspecified organism, unspecified whether acute organ dysfunction present [facility]) [A41.9] Patient Active Problem List   Diagnosis Date Noted  . CKD (chronic kidney disease), stage III 03/31/2019  . Normocytic anemia 03/31/2019  . Otitis media  03/31/2019  . Homelessness 03/31/2019  . Adjustment disorder with mixed disturbance of emotions and conduct   . Sepsis due to pneumonia ([place]) 11/11/2018  . Hyperkalemia 11/11/2018  . Esophageal dysphagia 10/13/2018  . Acute esophagitis 10/13/2018  . Loose stools 10/13/2018  . Routine general medical examination at a health care facility  10/13/2018  . Left lower lobe pneumonia 10/07/2018  . Acute respiratory failure ([place])   . AMS (altered mental status)   . Gastroesophageal reflux disease   . Hallucinations   . Hypoglycemia associated with diabetes ([place]) 08/12/2018  . At risk for adverse drug event 02/17/2018  . Abrasions of multiple sites   . Demand ischemia of myocardium ([place]) 02/05/2018  . Acute metabolic encephalopathy 09/73/5329  . DKA (diabetic ketoacidoses) ([place]) 02/03/2018  . Leukocytosis 02/03/2018  . Hypoglycemia 12/24/2017  . Syncope 12/24/2017  . Syncope and collapse 12/23/2017  . IV drug abuse ([place]) 04/19/2017  . Acute encephalopathy 04/19/2017  . Hemangioma 10/02/2016  . Foreign body (FB) in soft tissue   . 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[place])   . Gangrene ([place])   . Type I (juvenile type) diabetes mellitus without mention of complication, not stated as uncontrolled   . Tobacco abuse   . Essential hypertension   . Diabetic infection of right foot ([place]) 07/22/2016  . Diabetic foot infection ([place]) 07/22/2016  . Chronic ulcer of heel, right, with fat layer exposed ([place]) 05/20/2016  . DKA, type 1 ([place]) 04/18/2016  . AKI (acute kidney injury) ([place]) 04/18/2016  . Hyponatremia 04/18/2016  . Severe protein-calorie malnutrition ([place]) 04/18/2016  . Elevated troponin 04/18/2016  . Poor dentition 07/12/2015  . Tobacco use 07/06/2013  . PAD (peripheral artery disease) ([place]) 04/29/2013  . COPD, mild ([place])   . 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 Diabetic neuropathy ([place]) 06/21/2010  . PROLIFERATIVE DIABETIC RETINOPATHY 06/21/2010  . SKIN TAG 01/30/2010  . Chronic hepatitis C ([place]) 11/22/2008  . VITILIGO 11/22/2008  . PROTEINURIA, MILD 11/22/2008  . Substance abuse ([place]) 04/23/2007  . DEPRESSION 11/11/2006   PCP:  Patient, No Pcp Per Pharmacy:   [facility]  [address], [place] [address] [place]  [address] Phone: [phone] Fax: [phone]     Social Determinants of Health (SDOH) Interventions    Readmission Risk Interventions Readmission Risk Prevention Plan 08/12/2018  Transportation Screening Complete  Medication Review Press photographer) Complete  PCP or Specialist appointment within 3-5 days of discharge Complete  HRI or [place] Complete  SW Recovery Care/Counseling Consult Complete  [place] Not Applicable  Some recent data might be hidden

## 2019-04-01 NOTE — ED Provider Notes (Signed)
[facility] PCU Provider Note   CSN: [id number] Arrival date & time: 03/30/19  1125     History Chief Complaint  Patient presents with  . Weakness    [name] is a 63 y.o. male.   Weakness Severity:  Mild Onset quality:  Gradual Timing:  Constant Progression:  Worsening Chronicity:  New Context: alcohol use and recent infection   Relieved by:  None tried Worsened by:  Nothing Ineffective treatments:  None tried Associated symptoms: cough        Past Medical History:  Diagnosis Date  . DEPRESSION   . DIABETES MELLITUS, TYPE I   . DRUG ABUSE    pt should have NO controlled substances rx'ed  . Glaucoma   . HEPATITIS C    chronic  . Heroin overdose ([place]) 10/03/2018  . Heroin use 10/03/2018  . Hypertension 02/18/2011  . 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 Vitiligo     Patient Active Problem List   Diagnosis Date Noted  . CKD (chronic kidney disease), stage III 03/31/2019  . Normocytic anemia 03/31/2019  . Otitis media 03/31/2019  . Homelessness 03/31/2019  . Adjustment disorder with mixed disturbance of emotions and conduct   . Sepsis due to pneumonia ([place]) 11/11/2018  . Hyperkalemia 11/11/2018  . Esophageal dysphagia 10/13/2018  . Acute esophagitis 10/13/2018  . Loose stools 10/13/2018  . Routine general medical examination at a health care facility 10/13/2018  . Left lower lobe pneumonia 10/07/2018  . Acute respiratory failure ([place])   . AMS (altered mental status)   . Gastroesophageal reflux disease   . Hallucinations   . Hypoglycemia associated with diabetes ([place]) 08/12/2018  . At risk for adverse drug event 02/17/2018  . Abrasions of multiple sites   . Demand ischemia of myocardium ([place]) 02/05/2018  . Acute metabolic encephalopathy 35/36/1443  . DKA (diabetic ketoacidoses) ([place]) 02/03/2018  . Leukocytosis 02/03/2018  . Hypoglycemia 12/24/2017  . Syncope 12/24/2017  . Syncope and collapse 12/23/2017  . IV drug  abuse ([place]) 04/19/2017  . Acute encephalopathy 04/19/2017  . Hemangioma 10/02/2016  . Foreign body (FB) in soft tissue   . 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[place])   . Gangrene ([place])   . Type I (juvenile type) diabetes mellitus without mention of complication, not stated as uncontrolled   . Tobacco abuse   . Essential hypertension   . Diabetic infection of right foot ([place]) 07/22/2016  . Diabetic foot infection ([place]) 07/22/2016  . Chronic ulcer of heel, right, with fat layer exposed ([place]) 05/20/2016  . DKA, type 1 ([place]) 04/18/2016  . AKI (acute kidney injury) ([place]) 04/18/2016  . Hyponatremia 04/18/2016  . Severe protein-calorie malnutrition ([place]) 04/18/2016  . Elevated troponin 04/18/2016  . Poor dentition 07/12/2015  . Tobacco use 07/06/2013  . PAD (peripheral artery disease) ([place]) 04/29/2013  . COPD, mild ([place])   . 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 ([place]) 11/18/2011  . Diabetic neuropathy ([place]) 06/21/2010  . PROLIFERATIVE DIABETIC RETINOPATHY 06/21/2010  . SKIN TAG 01/30/2010  . Chronic hepatitis C ([place]) 11/22/2008  . VITILIGO 11/22/2008  . PROTEINURIA, MILD 11/22/2008  . Substance abuse ([place]) 04/23/2007  . DEPRESSION 11/11/2006    Past Surgical History:  Procedure Laterality Date  . ABDOMINAL AORTOGRAM W/LOWER EXTREMITY N/A 07/25/2016   Procedure: Abdominal Aortogram w/Bilateral Lower Extremity Runoff;  Surgeon: Conrad Ethete, MD;  Location: [facility];  Service: Cardiovascular;  Laterality: N/A;  . ABDOMINAL AORTOGRAM W/LOWER  EXTREMITY N/A 12/24/2016   Procedure: ABDOMINAL AORTOGRAM W/LOWER EXTREMITY;  Surgeon: Serafina Mitchell, MD;  Location: [facility];  Service: Cardiovascular;  Laterality: N/A;  . AMPUTATION TOE Right 07/26/2016   Procedure: AMPUTATION GREAT TOE;  Surgeon: Serafina Mitchell, MD;  Location: [facility];  Service: Vascular;  Laterality: Right;  . BIOPSY  08/25/2018   Procedure: BIOPSY;  Surgeon: Irving Copas.,  MD;  Location: [facility];  Service: Gastroenterology;;  . BIOPSY  11/16/2018   Procedure: BIOPSY;  Surgeon: Jerene Bears, MD;  Location: [facility] ENDOSCOPY;  Service: Gastroenterology;;  . ESOPHAGOGASTRODUODENOSCOPY N/A 08/25/2018   Procedure: ESOPHAGOGASTRODUODENOSCOPY (EGD);  Surgeon: Irving Copas., MD;  Location: [facility];  Service: Gastroenterology;  Laterality: N/A;  . ESOPHAGOGASTRODUODENOSCOPY (EGD) WITH PROPOFOL N/A 11/16/2018   Procedure: ESOPHAGOGASTRODUODENOSCOPY (EGD) WITH PROPOFOL;  Surgeon: Jerene Bears, MD;  Location: [facility] ENDOSCOPY;  Service: Gastroenterology;  Laterality: N/A;  . EYE SURGERY     retinal surgery x 2, right eye  . INSERTION OF ILIAC STENT Right 07/26/2016   Procedure: INSERTION OF RIGHT POPLITEAL STENT WITH BALLOON ANGIOPLASTY;  Surgeon: Serafina Mitchell, MD;  Location: [facility];  Service: Vascular;  Laterality: Right;  . LOWER EXTREMITY ANGIOGRAM Right 07/26/2016   Procedure: LOWER EXTREMITY ANGIOGRAM;  Surgeon: Serafina Mitchell, MD;  Location: [facility];  Service: Vascular;  Laterality: Right;  . PERIPHERAL VASCULAR BALLOON ANGIOPLASTY Right 07/26/2016   Procedure: PERIPHERAL VASCULAR BALLOON ANGIOPLASTY  RIGHT ANTERIOR TIBEAL ARTERY AND RIGHT SUPERFICIAL FEMORAL ARTERY;  Surgeon: Serafina Mitchell, MD;  Location: [facility];  Service: Vascular;  Laterality: Right;  . PERIPHERAL VASCULAR INTERVENTION Right 12/24/2016   Procedure: PERIPHERAL VASCULAR INTERVENTION;  Surgeon: Serafina Mitchell, MD;  Location: [facility];  Service: Cardiovascular;  Laterality: Right;  lower extr       Family History  Problem Relation Age of Onset  . Diabetes Father   . Kidney disease Father   . Arthritis Mother   . Heart disease Mother        CAD  . Colon cancer Neg Hx   . Esophageal cancer Neg Hx   . Inflammatory bowel disease Neg Hx   . Liver disease Neg Hx   . Pancreatic cancer Neg Hx   . Rectal cancer Neg Hx   . Stomach cancer Neg Hx     Social History   Tobacco Use    . Smoking status: Current Every Day Smoker    Packs/day: 1.00    Years: 51.00    Pack years: 51.00    Types: Cigarettes  . Smokeless tobacco: Former Systems developer    Types: Chew    Quit date: 1985  Substance Use Topics  . Alcohol use: No    Alcohol/week: 0.0 standard drinks    Comment: 08/14/14 none  . Drug use: Yes    Types: Marijuana, Heroin, Cocaine, "Crack" cocaine, Fentanyl, Methylphenidate    Comment: s/p rehab x 6, occasional drug use (per pt last  use was 06/2015)    Home Medications Prior to Admission medications   Medication Sig Start Date End Date Taking? Authorizing Provider  Accu-Chek Softclix Lancets lancets Use to monitor glucose levels BID; E10.29 08/19/18  Yes Renato Shin, MD  Blood Glucose Calibration (ACCU-CHEK AVIVA) SOLN 1 each by In Vitro route as needed. Use to calibrate meter prn 08/19/18  Yes Renato Shin, MD  blood glucose meter kit and supplies KIT Dispense based on patient and insurance preference. Use up to four  times daily as directed. (FOR ICD-9 250.00, 250.01). 08/15/18  Yes Mikhail, [place], DO  Blood Glucose Monitoring Suppl (ACCU-CHEK AVIVA PLUS) w/Device KIT 1 each by Does not apply route 2 (two) times a day. Use to monitor glucose levels BID; E10.29 08/19/18  Yes Renato Shin, MD  glucose blood (ACCU-CHEK AVIVA PLUS) test strip Use to monitor glucose levels BID; E10.29 08/19/18  Yes Renato Shin, MD  insulin aspart protamine- aspart (NOVOLOG MIX 70/30) (70-30) 100 UNIT/ML injection Inject 0.15 mLs (15 Units total) into the skin 2 (two) times daily with a meal. 03/14/19  Yes Upstill, Shari, PA-C  insulin glargine (LANTUS) 100 UNIT/ML injection Inject 20 Units into the skin daily.   Yes [provider]  Insulin Pen Needle 31G X 5 MM MISC Use with novolog flex pen 08/15/18  Yes Mikhail, [place], DO    Allergies    Codeine  Review of Systems   Review of Systems  HENT: Positive for congestion, ear pain and sneezing.   Respiratory: Positive for cough.    Neurological: Positive for weakness.  All other systems reviewed and are negative.   Physical Exam Updated Vital Signs BP (!) 143/77 (BP Location: Left Arm)   Pulse 72   Temp 98.3 F (36.8 C) (Oral)   Resp 18   Ht 6' (1.829 m)   Wt 67.3 kg   SpO2 97%   BMI 20.11 kg/m   Physical Exam Vitals and nursing note reviewed.  Constitutional:      Appearance: He is well-developed.  HENT:     Head: Normocephalic and atraumatic.     Right Ear: A middle ear effusion is present. Tympanic membrane is injected and erythematous.  Eyes:     Extraocular Movements: Extraocular movements intact.     Conjunctiva/sclera: Conjunctivae normal.  Cardiovascular:     Rate and Rhythm: Normal rate.  Pulmonary:     Effort: Pulmonary effort is normal. Tachypnea present. No respiratory distress.  Abdominal:     General: There is no distension.  Musculoskeletal:        General: No swelling or tenderness. Normal range of motion.     Cervical back: Normal range of motion.  Skin:    General: Skin is warm and dry.  Neurological:     General: No focal deficit present.     Mental Status: He is alert.     ED Results / Procedures / Treatments   Labs (all labs ordered are listed, but only abnormal results are displayed) Labs Reviewed  COMPREHENSIVE METABOLIC PANEL - Abnormal; Notable for the following components:      Result Value   Sodium 133 (*)    Glucose, Bld 102 (*)    Creatinine, Ser 1.50 (*)    Calcium 8.2 (*)    Total Protein 6.1 (*)    Albumin 1.9 (*)    GFR calc non Af Amer 49 (*)    GFR calc Af Amer 57 (*)    All other components within normal limits  CBC WITH DIFFERENTIAL/PLATELET - Abnormal; Notable for the following components:   WBC 22.4 (*)    RBC 3.90 (*)    Hemoglobin 10.5 (*)    HCT 31.7 (*)    Neutro Abs 18.7 (*)    Monocytes Absolute 2.1 (*)    Abs Immature Granulocytes 0.19 (*)    All other components within normal limits  URINALYSIS, ROUTINE W REFLEX MICROSCOPIC -  Abnormal; Notable for the following components:   Glucose, UA >=500 (*)  Protein, ur 100 (*)    Bacteria, UA RARE (*)    All other components within normal limits  GLUCOSE, CAPILLARY - Abnormal; Notable for the following components:   Glucose-Capillary 167 (*)    All other components within normal limits  GLUCOSE, CAPILLARY - Abnormal; Notable for the following components:   Glucose-Capillary 349 (*)    All other components within normal limits  GLUCOSE, CAPILLARY - Abnormal; Notable for the following components:   Glucose-Capillary 147 (*)    All other components within normal limits  GLUCOSE, CAPILLARY - Abnormal; Notable for the following components:   Glucose-Capillary 315 (*)    All other components within normal limits  CBG MONITORING, ED - Abnormal; Notable for the following components:   Glucose-Capillary 529 (*)    All other components within normal limits  CBG MONITORING, ED - Abnormal; Notable for the following components:   Glucose-Capillary 506 (*)    All other components within normal limits  CBG MONITORING, ED - Abnormal; Notable for the following components:   Glucose-Capillary 461 (*)    All other components within normal limits  SARS CORONAVIRUS 2 (TAT 6-24 HRS)  RESPIRATORY PANEL BY PCR  EXPECTORATED SPUTUM ASSESSMENT W REFEX TO RESP CULTURE  LACTIC ACID, PLASMA  HIV ANTIBODY (ROUTINE TESTING W REFLEX)  PHOSPHORUS  MAGNESIUM  CBC WITH DIFFERENTIAL/PLATELET  BASIC METABOLIC PANEL  HEMOGLOBIN A1C  CBG MONITORING, ED    EKG EKG Interpretation  Date/Time:  Tuesday March 30 2019 11:30:33 EST Ventricular Rate:  85 PR Interval:  128 QRS Duration: 90 QT Interval:  384 QTC Calculation: 456 R Axis:   -32 Text Interpretation: Normal sinus rhythm Right atrial enlargement Left axis deviation Pulmonary disease pattern Nonspecific ST and T wave abnormality Abnormal ECG No significant change since last tracing Confirmed by Merrily Pew [phone]) on 03/31/2019  1:43:29 AM   Radiology DG Chest 1 View  Result Date: 03/30/2019 CLINICAL DATA:  Generalized weakness. EXAM: CHEST  1 VIEW COMPARISON:  Portable chest 11/11/2018. FINDINGS: Mediastinum appears normal. Prominent ill-defined infiltrate noted over the right mid lung. Close follow-up chest x-rays to demonstrate clearing and to exclude underlying mass lesion suggested. No pleural effusion or pneumothorax. Heart size normal. Degenerative change thoracic spine. IMPRESSION: Prominent ill-defined infiltrate noted over the right mid lung. Close follow-up chest x-rays to demonstrate clearing and to exclude underlying mass lesion suggested. Electronically Signed   By: Marcello Moores  Register   On: 03/30/2019 12:56   CT Chest Wo Contrast  Result Date: 03/31/2019 CLINICAL DATA:  Respiratory illness. Immunodeficiency. Cough. EXAM: CT CHEST WITHOUT CONTRAST TECHNIQUE: Multidetector CT imaging of the chest was performed following the standard protocol without IV contrast. Patient was injected with Omnipaque 300 IV, however contrast leaked from the tubing. Review of images demonstrated no definite intravascular contrast, and contrast seen within the soft tissues of the included left upper extremity consistent with extravasation. Approximately 65 cc Omnipaque 300 IV. CONTRAST EXTRAVASATION CONSULTATION: Type of contrast:  Omnipaque 300 Site of extravasation: Left antecubital fossa Estimated volume of extravasation: 65 ml Area of extravasation scanned with CT? Partially. Contrast also seen in external to the patient on the CT table. PATIENT'S SIGNS AND SYMPTOMS Skin blistering/ulceration: no Decrease capillary refill: no Change in skin color: no Decreased motor function or severe tightness: no Decreased pulses distal to site of extravasation: no Altered sensation: no Increasing pain or signs of increased swelling during observation: no TREATMENT Observation period at site: Emergency department patient returned to the emergency  department. Limb  elevation: Per protocol Ice packs applied: Per protocol Heat pads applied: Per protocol Plastic surgery consulted? no DOCUMENTATION AND FOLLOW-UP Site contrast extravasation forms submitted? Per protocol. Post extravasation orders completed? Pending. Was additional follow up assigned to PA's? no Patient's questions answered? yes Patient instructed to call [phone] or seek immediate medical care if symptoms progress. COMPARISON:  Radiograph yesterday. Additional prior radiographs. No prior chest CT. FINDINGS: Cardiovascular: Aortic atherosclerosis. No aneurysm. Heart is normal in size. Coronary artery calcifications versus stents. No pericardial effusion. Mediastinum/Nodes: Small mediastinal lymph nodes not enlarged by size criteria. Limited assessment for hilar adenopathy in the absence of IV contrast. Esophageal Straughter thickening from the mid through distal esophagus. No visualized thyroid nodule. Lungs/Pleura: Rounded masslike opacity in the anterior right upper lobe abutting the fissure has central air bronchograms. This measures approximately 5.1 x 3.9 x 3.2 cm slight ground-glass opacity in the periphery with lobulated contours. No other focal airspace disease, allowing for limitations related to patient motion artifact, particularly in the lower lobes. Linear left lower lobe atelectasis. There is central bronchial thickening. Mild emphysema. No pulmonary edema. No pleural fluid. Trachea and mainstem bronchi are patent. Upper Abdomen: No acute findings. Cyst in the liver is partially included, better appreciated on prior abdominal CT. Vascular calcifications in the upper abdomen Musculoskeletal: There are no acute or suspicious osseous abnormalities. Mild degenerative change in the spine. IMPRESSION: 1. Rounded masslike opacity in the anterior right upper lobe measuring 5.1 x 3.9 x 3.2 cm with central air bronchograms. Findings may represent rounded pneumonia, however recommend close interval  follow-up after course of treatment to ensure resolution. 2. Esophageal Uballe thickening from the mid through distal esophagus, can be seen with reflux or esophagitis. 3. Coronary artery calcifications versus stents. Aortic Atherosclerosis (ICD10-I70.0) and Emphysema (ICD10-J43.9). Electronically Signed   By: Keith Rake M.D.   On: 03/31/2019 06:54    Procedures Procedures (including critical care time)  Medications Ordered in ED Medications  sodium chloride flush (NS) 0.9 % injection 3 mL (has no administration in time range)  enoxaparin (LOVENOX) injection 40 mg (40 mg Subcutaneous Given 03/31/19 1535)  cefTRIAXone (ROCEPHIN) 2 g in sodium chloride 0.9 % 100 mL IVPB (has no administration in time range)  azithromycin (ZITHROMAX) 500 mg in sodium chloride 0.9 % 250 mL IVPB (has no administration in time range)  insulin glargine (LANTUS) injection 10 Units (10 Units Subcutaneous Given 03/31/19 1209)  insulin aspart (novoLOG) injection 0-15 Units (11 Units Subcutaneous Given 04/01/19 0408)  guaiFENesin (ROBITUSSIN) 100 MG/5ML solution 200 mg (200 mg Oral Given 04/01/19 0011)  doxycycline (VIBRA-TABS) tablet 100 mg (100 mg Oral Given 03/31/19 0408)  iohexol (OMNIPAQUE) 300 MG/ML solution 75 mL (75 mLs Intravenous Contrast Given 03/31/19 0344)  acetaminophen (TYLENOL) tablet 1,000 mg (1,000 mg Oral Given 03/31/19 0715)  cefTRIAXone (ROCEPHIN) 1 g in sodium chloride 0.9 % 100 mL IVPB (0 g Intravenous Stopped 03/31/19 0935)  azithromycin (ZITHROMAX) 500 mg in sodium chloride 0.9 % 250 mL IVPB (0 mg Intravenous Stopped 03/31/19 1211)  insulin aspart (novoLOG) injection 0-15 Units (11 Units Subcutaneous Given 03/31/19 2017)  traMADol (ULTRAM) tablet 50 mg (50 mg Oral Given 03/31/19 2036)    ED Course  I have reviewed the triage vital signs and the nursing notes.  Pertinent labs & imaging results that were available during my care of the patient were reviewed by me and considered in my medical  decision making (see chart for details).    MDM Rules/Calculators/A&P  CAP with sepsis. Abx/ cltures. Admission. No hypoxia.   Final Clinical Impression(s) / ED Diagnoses Final diagnoses:  Community acquired pneumonia of right middle lobe of lung  Sepsis, due to unspecified organism, unspecified whether acute organ dysfunction present [facility])    Rx / DC Orders ED Discharge Orders    None       Scharlene Catalina, Corene Cornea, MD 04/01/19 [phone]

## 2019-04-01 NOTE — Progress Notes (Signed)
Pt was cursing staffs about not being able to take a shower today.  Pt did not want staffs to stay nearby during using urinal. I was able to assist him to stand up to use urinal and wait behind the curtain. He didn['t want [place] to speak while he was using urinal.  He cursed staffs when we ask him if he needed any help while using urinal. Pt kept asking for snack even if we explain about risk of high blood sugar.

## 2019-04-01 NOTE — Progress Notes (Signed)
Patient refusing assistance when standing to use urinal from NT or RN  Requires staff to stand behind the curtain.

## 2019-04-02 LAB — CBC
HCT: 29.2 % — ABNORMAL LOW (ref 39.0–52.0)
Hemoglobin: 9.4 g/dL — ABNORMAL LOW (ref 13.0–17.0)
MCH: 26.2 pg (ref 26.0–34.0)
MCHC: 32.2 g/dL (ref 30.0–36.0)
MCV: 81.3 fL (ref 80.0–100.0)
Platelets: 402 10*3/uL — ABNORMAL HIGH (ref 150–400)
RBC: 3.59 MIL/uL — ABNORMAL LOW (ref 4.22–5.81)
RDW: 14 % (ref 11.5–15.5)
WBC: 8.3 10*3/uL (ref 4.0–10.5)
nRBC: 0 % (ref 0.0–0.2)

## 2019-04-02 LAB — GLUCOSE, CAPILLARY
Glucose-Capillary: 185 mg/dL — ABNORMAL HIGH (ref 70–99)
Glucose-Capillary: 210 mg/dL — ABNORMAL HIGH (ref 70–99)
Glucose-Capillary: 247 mg/dL — ABNORMAL HIGH (ref 70–99)
Glucose-Capillary: 267 mg/dL — ABNORMAL HIGH (ref 70–99)
Glucose-Capillary: 271 mg/dL — ABNORMAL HIGH (ref 70–99)
Glucose-Capillary: 287 mg/dL — ABNORMAL HIGH (ref 70–99)
Glucose-Capillary: 320 mg/dL — ABNORMAL HIGH (ref 70–99)
Glucose-Capillary: 360 mg/dL — ABNORMAL HIGH (ref 70–99)
Glucose-Capillary: 44 mg/dL — CL (ref 70–99)
Glucose-Capillary: 70 mg/dL (ref 70–99)

## 2019-04-02 LAB — BASIC METABOLIC PANEL
Anion gap: 8 (ref 5–15)
BUN: 21 mg/dL (ref 8–23)
CO2: 25 mmol/L (ref 22–32)
Calcium: 8.3 mg/dL — ABNORMAL LOW (ref 8.9–10.3)
Chloride: 102 mmol/L (ref 98–111)
Creatinine, Ser: 1.16 mg/dL (ref 0.61–1.24)
GFR calc Af Amer: >60 mL/min (ref >60)
GFR calc non Af Amer: >60 mL/min (ref >60)
Glucose, Bld: 270 mg/dL — ABNORMAL HIGH (ref 70–99)
Potassium: 4.4 mmol/L (ref 3.5–5.1)
Sodium: 135 mmol/L (ref 135–145)

## 2019-04-02 LAB — MAGNESIUM: Magnesium: 1.7 mg/dL (ref 1.7–2.4)

## 2019-04-02 MED ORDER — HYDROCODONE-ACETAMINOPHEN 5-325 MG PO TABS
1.0000 | ORAL_TABLET | Freq: Four times a day (QID) | ORAL | Status: DC | PRN
  Administered 2019-04-02: 22:00:00 1 via ORAL
  Administered 2019-04-03 – 2019-04-05 (×8): 2 via ORAL
  Administered 2019-04-05: 10:00:00 1 via ORAL
  Administered 2019-04-05 – 2019-04-06 (×3): 2 via ORAL
  Filled 2019-04-02 (×4): qty 2
  Filled 2019-04-02: qty 1
  Filled 2019-04-02 (×2): qty 2
  Filled 2019-04-02: qty 1
  Filled 2019-04-02: qty 2
  Filled 2019-04-02: qty 1
  Filled 2019-04-02 (×4): qty 2

## 2019-04-02 MED ORDER — INSULIN ASPART 100 UNIT/ML ~~LOC~~ SOLN: 0.0000 [IU] | Freq: Every day | SUBCUTANEOUS | Status: DC

## 2019-04-02 MED ORDER — GABAPENTIN 100 MG PO CAPS
200.0000 mg | ORAL_CAPSULE | Freq: Three times a day (TID) | ORAL | Status: DC
  Administered 2019-04-02 – 2019-04-05 (×10): 200 mg via ORAL
  Filled 2019-04-02 (×10): qty 2

## 2019-04-02 MED ORDER — INSULIN ASPART 100 UNIT/ML ~~LOC~~ SOLN
4.0000 [IU] | Freq: Three times a day (TID) | SUBCUTANEOUS | Status: DC
  Administered 2019-04-02 – 2019-04-12 (×25): 4 [IU] via SUBCUTANEOUS

## 2019-04-02 MED ORDER — INSULIN ASPART 100 UNIT/ML ~~LOC~~ SOLN
0.0000 [IU] | Freq: Three times a day (TID) | SUBCUTANEOUS | Status: DC
  Administered 2019-04-02 (×2): 5 [IU] via SUBCUTANEOUS
  Administered 2019-04-02: 3 [IU] via SUBCUTANEOUS
  Administered 2019-04-03: 12:00:00 2 [IU] via SUBCUTANEOUS
  Administered 2019-04-03: 18:00:00 3 [IU] via SUBCUTANEOUS
  Administered 2019-04-03: 07:00:00 5 [IU] via SUBCUTANEOUS
  Administered 2019-04-04: 18:00:00 2 [IU] via SUBCUTANEOUS
  Administered 2019-04-04 – 2019-04-05 (×2): 5 [IU] via SUBCUTANEOUS

## 2019-04-02 MED ORDER — AMLODIPINE BESYLATE 5 MG PO TABS
5.0000 mg | ORAL_TABLET | Freq: Every day | ORAL | Status: DC
  Administered 2019-04-02 – 2019-04-23 (×22): 5 mg via ORAL
  Filled 2019-04-02 (×22): qty 1

## 2019-04-02 MED ORDER — ACETAMINOPHEN 325 MG PO TABS
650.0000 mg | ORAL_TABLET | Freq: Four times a day (QID) | ORAL | Status: DC | PRN
  Administered 2019-04-02 – 2019-04-15 (×4): 650 mg via ORAL
  Filled 2019-04-02 (×5): qty 2

## 2019-04-02 NOTE — Progress Notes (Addendum)
Hypoglycemic Event  CBG: 45  Treatment: juice and cereal   Symptoms: tingling hands per patient  Follow-up CBG: Time:0026 CBG Result:70  Possible Reasons for Event: lantus, pt states his CBG does this sometimes  Comments/MD notified:   Pt CBG 45, checked twice.  RN requested patient to drink half a cup of juice.  Patient refused and wanted cereal instead.  RN gave patient cereal.  Pt CBG still 44 15 minutes later.  RN requested patient to finish juice.  Patient finished juice and CBG recheck of 70. Patient insistent on another bowl of cereal, patient had additional bowl. Refusing education of CBG control.  Patient states "he knows how his diabetes works".    [place]

## 2019-04-02 NOTE — Progress Notes (Signed)
Inpatient Diabetes Program Recommendations  AACE/ADA: New Consensus Statement on Inpatient Glycemic Control  Target Ranges:  Prepandial:   less than 140 mg/dL      Peak postprandial:   less than 180 mg/dL (1-2 hours)      Critically ill patients:  140 - 180 mg/dL   Results for TREVAN, MESSMAN (MRN [id number]) as of 04/02/2019 09:31  Ref. Range 04/02/2019 00:14 04/02/2019 00:26 04/02/2019 04:15 04/02/2019 04:18 04/02/2019 07:35  Glucose-Capillary Latest Ref Range: 70 - 99 mg/dL 44 (LL) 70 247 (H) 271 (H) 267 (H)  Results for DARYLE, AMIS (MRN [id number]) as of 04/02/2019 09:31  Ref. Range 04/01/2019 07:35 04/01/2019 11:18 04/01/2019 16:10 04/01/2019 20:20 04/01/2019 23:47 04/01/2019 23:49  Glucose-Capillary Latest Ref Range: 70 - 99 mg/dL 170 (H)  Novolog 3 units  Lantus 20 units @9 :29 198 (H)  Novolog 3 units 339 (H)  Novolog 11 units 302 (H)  Novolog 11 units 45 (L) 43 (LL)   Review of Glycemic Control  Diabetes history: DM Outpatient Diabetes medications: 70/30 15 units BID, Lantus 20 units daily Current orders for Inpatient glycemic control: Lantus 20 units daily, Novolog 0-9 units TID with meals, Novolog 0-5 units QHS  Inpatient Diabetes Program Recommendations:   Insulin-Correction: Noted patient experienced hypoglycemia last night after getting Novolog 11 units at 20:29 on 04/01/19. Per RN notes, hypoglycemia was treated with juice and patient insisted on also having 2 boxes of cereal. As a result glucose up to 267 mg/dl this morning. Also noted that Novolog correction was decreased and changed to AC&HS.  Insulin-Meal Coverage: Please consider ordering Novolog 4 units TID with meals for meal coverage if patient eats at least 50% of meals.  Thanks, Barnie Alderman, RN, MSN, CDE Diabetes Coordinator Inpatient Diabetes Program [phone] (Team Pager from 8am to 5pm)

## 2019-04-02 NOTE — Evaluation (Signed)
Physical Therapy Evaluation Patient Details Name: [name] MRN: [id number] DOB: 22-Nov-1955 Today's Date: 04/02/2019   History of Present Illness  Pt is a 63 y/o male with PMH of DM, HTN, chronic hepatitis C, polysubstance abuse, CKD III, presents with generalized weakness, decreased PO intake, and nausea over the last 3 days. Found with sepsis secondary to community acquired pneumonia.   Clinical Impression  Pt was seen for mobility and initially was trying to get to BR alone when PT arrived.  He is unsafe with extra lines, will continue to struggle with balance due to his stiffness of hips and ankles that limit Hip ext and DF.  Pt is also in need of RW for management of his foot drop, which puts him in a high level of risk given that he is homeless.  Will follow up with him acutely to work on ROM and strength of limited joints, to increase control of balance and will need to be considered for orthotics to manage his foot drop if recovery does not occur.  SNF recommended for this need.    Follow Up Recommendations SNF    Equipment Recommendations  Rolling walker with 5" wheels    Recommendations for Other Services       Precautions / Restrictions Precautions Precautions: Fall Precaution Comments: monitor O2 sats Restrictions Weight Bearing Restrictions: No      Mobility  Bed Mobility Overal bed mobility: Needs Assistance Bed Mobility: Supine to Sit;Sit to Supine     Supine to sit: Supervision Sit to supine: Supervision   General bed mobility comments: supervision to manage restrictions like IV  Transfers Overall transfer level: Needs assistance Equipment used: Rolling walker (2 wheeled) Transfers: Sit to/from Stand Sit to Stand: Supervision;Min guard         General transfer comment: to manage lines and safety  Ambulation/Gait Ambulation/Gait assistance: Min guard Gait Distance (Feet): 100 Feet Assistive device: Rolling walker (2 wheeled);1 person hand held  assist Gait Pattern/deviations: Step-through pattern;Shuffle;Wide base of support Gait velocity: variability  Gait velocity interpretation: <1.31 ft/sec, indicative of household ambulator General Gait Details: steppage gait with B foot drop, very unaware of being on edges of paper pants, unaware of the walker in turns, reather impulsive  Stairs            Wheelchair Mobility    Modified Rankin (Stroke Patients Only)       Balance Overall balance assessment: Needs assistance Sitting-balance support: Feet supported Sitting balance-Leahy Scale: Good     Standing balance support: Bilateral upper extremity supported;During functional activity Standing balance-Leahy Scale: Fair Standing balance comment: fair but less than fair dynamically                             Pertinent Vitals/Pain Pain Assessment: Faces Faces Pain Scale: No hurt Pain Location: B LES Pain Descriptors / Indicators: Discomfort Pain Intervention(s): Monitored during session;Repositioned    Home Living Family/patient expects to be discharged to:: Other (Comment)                 Additional Comments: pt homeless, reports living in a motel but unable to return there    Prior Function Level of Independence: Needs assistance   Gait / Transfers Assistance Needed: RW at home with [facility] but has lost both  ADL's / Homemaking Assistance Needed: reports able to bathe and dress self   Comments: all his equipment is missing now     Hand  Dominance   Dominant Hand: Right    Extremity/Trunk Assessment   Upper Extremity Assessment Upper Extremity Assessment: Generalized weakness    Lower Extremity Assessment Lower Extremity Assessment: Generalized weakness(B foot drop)    Cervical / Trunk Assessment Cervical / Trunk Assessment: Kyphotic  Communication   Communication: No difficulties  Cognition Arousal/Alertness: Awake/alert Behavior During Therapy: WFL for tasks  assessed/performed Overall Cognitive Status: No family/caregiver present to determine baseline cognitive functioning Area of Impairment: Safety/judgement;Awareness;Problem solving                     Memory: Decreased recall of precautions   Safety/Judgement: Decreased awareness of safety;Decreased awareness of deficits Awareness: Intellectual;Emergent Problem Solving: Slow processing;Requires verbal cues;Difficulty sequencing General Comments: pt was up to go to BR and trying to pull off IV      General Comments General comments (skin integrity, edema, etc.): pt had O2 sats 99-98% with mobility and was able to control walker but pays little attention to drop foot condition    Exercises     Assessment/Plan    PT Assessment Patient needs continued PT services  PT Problem List Decreased strength;Decreased range of motion;Decreased activity tolerance;Decreased balance;Decreased mobility;Decreased coordination;Decreased cognition;Decreased knowledge of use of DME;Decreased safety awareness;Cardiopulmonary status limiting activity;Obesity;Decreased skin integrity       PT Treatment Interventions DME instruction;Gait training;Functional mobility training;Therapeutic activities;Therapeutic exercise;Balance training;Neuromuscular re-education;Patient/family education    PT Goals (Current goals can be found in the Care Plan section)  Acute Rehab PT Goals Patient Stated Goal: to get stronger and get life together PT Goal Formulation: With patient Time For Goal Achievement: 04/16/19 Potential to Achieve Goals: Good    Frequency Min 2X/week   Barriers to discharge Decreased caregiver support has been homeless, alone    Co-evaluation               AM-PAC PT "6 Clicks" Mobility  Outcome Measure Help needed turning from your back to your side while in a flat bed without using bedrails?: None Help needed moving from lying on your back to sitting on the side of a flat bed  without using bedrails?: A Little Help needed moving to and from a bed to a chair (including a wheelchair)?: A Little Help needed standing up from a chair using your arms (e.g., wheelchair or bedside chair)?: A Little Help needed to walk in hospital room?: A Little Help needed climbing 3-5 steps with a railing? : A Lot 6 Click Score: 18    End of Session Equipment Utilized During Treatment: Gait belt Activity Tolerance: Patient limited by fatigue;Treatment limited secondary to medical complications (Comment) Patient left: in bed;with call bell/phone within reach;with bed alarm set Nurse Communication: Mobility status PT Visit Diagnosis: Unsteadiness on feet (R26.81);Muscle weakness (generalized) (M62.81);Difficulty in walking, not elsewhere classified (R26.2)    Time: 8832-5498 PT Time Calculation (min) (ACUTE ONLY): 31 min   Charges:   PT Evaluation $PT Eval Moderate Complexity: 1 Mod PT Treatments $Gait Training: 8-22 mins       Ramond Dial 04/02/2019, 2:23 PM  Mee Hives, PT MS Acute Rehab Dept. Number: [place] [facility] [place]

## 2019-04-02 NOTE — Progress Notes (Signed)
Patient complaining of headache.  TRH notified.
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
## 2019-04-02 NOTE — Progress Notes (Signed)
Patient stated that he feels like his blood sugar is dropping and needs some snack. However, the blood sugar was 320 when I checked. Notified MD.

## 2019-04-02 NOTE — Evaluation (Signed)
Occupational Therapy Evaluation Patient Details Name: [name] MRN: [id number] DOB: 09-14-1955 Today's Date: 04/02/2019    History of Present Illness Pt is a 63 y/o male with PMH of DM, HTN, chronic hepatitis C, polysubstance abuse, CKD III, presents with generalized weakness, decreased PO intake, and nausea over the last 3 days. Found with sepsis secondary to community acquired pneumonia.    Clinical Impression   PTA patient reports furniture walking and completing basic ADLs with independence.  Admitted for above and limited by problem list below, including decreased activity tolerance, impaired balance, and generalized weakness.  Patient currently requires min guard for transfers and in room mobility using RW, min guard for LB ADLs and setup for UB ADLs. Would benefit from continued OT services while admitted and after dc at SNF level in order to optimize independence with ADLs, mobility.     Follow Up Recommendations  SNF    Equipment Recommendations  3 in 1 bedside commode    Recommendations for Other Services       Precautions / Restrictions Precautions Precautions: Fall Restrictions Weight Bearing Restrictions: No      Mobility Bed Mobility Overal bed mobility: Needs Assistance Bed Mobility: Supine to Sit;Sit to Supine     Supine to sit: Supervision Sit to supine: Supervision   General bed mobility comments: supervision for safety   Transfers Overall transfer level: Needs assistance Equipment used: Rolling walker (2 wheeled) Transfers: Sit to/from Stand Sit to Stand: Min guard         General transfer comment: min guard for safety and balance, cueing for hand placement    Balance Overall balance assessment: Needs assistance Sitting-balance support: No upper extremity supported;Feet supported Sitting balance-Leahy Scale: Good     Standing balance support: Bilateral upper extremity supported;During functional activity;No upper extremity  supported Standing balance-Leahy Scale: Poor Standing balance comment: relaint on BUE support dynamically, static standing with min guard at sink wiht 0 hand support                           ADL either performed or assessed with clinical judgement   ADL Overall ADL's : Needs assistance/impaired     Grooming: Min guard;Standing   Upper Body Bathing: Set up;Sitting   Lower Body Bathing: Min guard;Sit to/from stand   Upper Body Dressing : Set up;Sitting   Lower Body Dressing: Min guard;Sit to/from stand   Toilet Transfer: Min guard;Ambulation;RW Toilet Transfer Details (indicate cue type and reason): simulated in room         Functional mobility during ADLs: Min guard;Rolling walker;Cueing for safety General ADL Comments: pt limited by weakness, impaired balance, decreased activity tolerance     Vision   Vision Assessment?: No apparent visual deficits     Perception     Praxis      Pertinent Vitals/Pain Pain Assessment: Faces Faces Pain Scale: Hurts a little bit Pain Location: B LES Pain Descriptors / Indicators: Discomfort Pain Intervention(s): Monitored during session;Repositioned     Hand Dominance Right   Extremity/Trunk Assessment Upper Extremity Assessment Upper Extremity Assessment: Generalized weakness   Lower Extremity Assessment Lower Extremity Assessment: Defer to PT evaluation       Communication Communication Communication: No difficulties   Cognition Arousal/Alertness: Awake/alert Behavior During Therapy: WFL for tasks assessed/performed Overall Cognitive Status: No family/caregiver present to determine baseline cognitive functioning Area of Impairment: Memory;Awareness;Problem solving;Safety/judgement  Memory: Decreased short-term memory   Safety/Judgement: Decreased awareness of safety;Decreased awareness of deficits Awareness: Emergent Problem Solving: Difficulty sequencing;Requires verbal  cues General Comments: Short blessed test: 4/28, normal; with deficits with STM, safety and problem sovling    General Comments  VSS    Exercises     Shoulder Instructions      Home Living Family/patient expects to be discharged to:: Unsure                                 Additional Comments: pt homeless, reports living in a motel but unable to return there      Prior Functioning/Environment Level of Independence: Needs assistance  Gait / Transfers Assistance Needed: reports furniture walking or use his wc  ADL's / Homemaking Assistance Needed: reports able to bathe and dress self    Comments: reports having lost DME, has wheelchair (but needs a new one)         OT Problem List: Decreased strength;Decreased activity tolerance;Impaired balance (sitting and/or standing);Decreased safety awareness;Cardiopulmonary status limiting activity;Decreased knowledge of use of DME or AE      OT Treatment/Interventions: Self-care/ADL training;Energy conservation;DME and/or AE instruction;Therapeutic activities;Patient/family education;Balance training;Therapeutic exercise    OT Goals(Current goals can be found in the care plan section) Acute Rehab OT Goals Patient Stated Goal: to get stronger and get my independence back OT Goal Formulation: With patient Time For Goal Achievement: 04/16/19 Potential to Achieve Goals: Good  OT Frequency: Min 2X/week   Barriers to D/C:            Co-evaluation              AM-PAC OT "6 Clicks" Daily Activity     Outcome Measure Help from another person eating meals?: None Help from another person taking care of personal grooming?: A Little Help from another person toileting, which includes using toliet, bedpan, or urinal?: A Little Help from another person bathing (including washing, rinsing, drying)?: A Little Help from another person to put on and taking off regular upper body clothing?: A Little Help from another person to  put on and taking off regular lower body clothing?: A Little 6 Click Score: 19   End of Session Equipment Utilized During Treatment: Rolling walker  Activity Tolerance: Patient tolerated treatment well Patient left: in bed;with call bell/phone within reach;with bed alarm set  OT Visit Diagnosis: Other abnormalities of gait and mobility (R26.89);Muscle weakness (generalized) (M62.81)                Time: 4825-0037 OT Time Calculation (min): 24 min Charges:  OT General Charges $OT Visit: 1 Visit OT Evaluation $OT Eval Moderate Complexity: 1 Mod OT Treatments $Self Care/Home Management : 8-22 mins  Jolaine Artist, OT Acute Rehabilitation Services Pager [phone] Office [phone]   Delight Stare 04/02/2019, 1:07 PM

## 2019-04-02 NOTE — TOC Progression Note (Signed)
Transition of Care [facility]) - Progression Note    Patient Details  Name: [name] MRN: [id number] Date of Birth: 1956/03/01  Transition of Care [facility]) CM/SW [place], [place] Phone Number: 04/02/2019, 1:32 PM  Clinical Narrative:     Helene Kelp declined on the patient. CSW reached out to [place] with [employer]. Clarene Critchley stated she never received his referral. CSW faxed it over again.   CSW faxed the patient out to other SNF's. CSW is awaiting bed offers.   Expected Discharge Plan: Skilled Nursing Facility Barriers to Discharge: Homeless with medical needs, SNF Pending bed offer  Expected Discharge Plan and Services Expected Discharge Plan: [place] arrangements for the past 2 months: Hotel/Motel([facility] [address] [facility])                                       Social Determinants of Health (SDOH) Interventions    Readmission Risk Interventions Readmission Risk Prevention Plan 08/12/2018  Transportation Screening Complete  Medication Review Press photographer) Complete  PCP or Specialist appointment within 3-5 days of discharge Complete  HRI or [place] Complete  SW Recovery Care/Counseling Consult Complete  [place] Not Applicable  Some recent data might be hidden

## 2019-04-03 LAB — CBC
HCT: 29.4 % — ABNORMAL LOW (ref 39.0–52.0)
Hemoglobin: 9.4 g/dL — ABNORMAL LOW (ref 13.0–17.0)
MCH: 26.2 pg (ref 26.0–34.0)
MCHC: 32 g/dL (ref 30.0–36.0)
MCV: 81.9 fL (ref 80.0–100.0)
Platelets: 443 10*3/uL — ABNORMAL HIGH (ref 150–400)
RBC: 3.59 MIL/uL — ABNORMAL LOW (ref 4.22–5.81)
RDW: 13.9 % (ref 11.5–15.5)
WBC: 7.1 10*3/uL (ref 4.0–10.5)
nRBC: 0 % (ref 0.0–0.2)

## 2019-04-03 LAB — GLUCOSE, CAPILLARY
Glucose-Capillary: 252 mg/dL — ABNORMAL HIGH (ref 70–99)
Glucose-Capillary: 158 mg/dL — ABNORMAL HIGH (ref 70–99)
Glucose-Capillary: 210 mg/dL — ABNORMAL HIGH (ref 70–99)
Glucose-Capillary: 175 mg/dL — ABNORMAL HIGH (ref 70–99)

## 2019-04-03 LAB — BASIC METABOLIC PANEL
Anion gap: 8 (ref 5–15)
BUN: 23 mg/dL (ref 8–23)
CO2: 24 mmol/L (ref 22–32)
Calcium: 8.3 mg/dL — ABNORMAL LOW (ref 8.9–10.3)
Chloride: 103 mmol/L (ref 98–111)
Creatinine, Ser: 1.26 mg/dL — ABNORMAL HIGH (ref 0.61–1.24)
GFR calc Af Amer: >60 mL/min (ref >60)
GFR calc non Af Amer: >60 mL/min (ref >60)
Glucose, Bld: 324 mg/dL — ABNORMAL HIGH (ref 70–99)
Potassium: 4.9 mmol/L (ref 3.5–5.1)
Sodium: 135 mmol/L (ref 135–145)

## 2019-04-03 LAB — MAGNESIUM: Magnesium: 1.8 mg/dL (ref 1.7–2.4)

## 2019-04-03 MED ORDER — INSULIN GLARGINE 100 UNIT/ML ~~LOC~~ SOLN
25.0000 [IU] | Freq: Every day | SUBCUTANEOUS | Status: DC
  Administered 2019-04-04 – 2019-04-12 (×9): 25 [IU] via SUBCUTANEOUS
  Filled 2019-04-03 (×10): qty 0.25

## 2019-04-03 MED ORDER — LORAZEPAM 2 MG/ML IJ SOLN: 1.0000 mg | Freq: Once | INTRAMUSCULAR | Status: DC

## 2019-04-03 MED ORDER — NEOMYCIN-POLYMYXIN-HC 3.5-10000-1 OT SUSP
4.0000 [drp] | Freq: Three times a day (TID) | OTIC | Status: DC
  Administered 2019-04-03 – 2019-04-23 (×54): 4 [drp] via OTIC
  Filled 2019-04-03 (×2): qty 10

## 2019-04-03 NOTE — Progress Notes (Signed)
Alger Memos NP ordered ativan 1mg  IV, pt refused to take.

## 2019-04-03 NOTE — Plan of Care (Signed)
  Problem: Elimination: Goal: Will not experience complications related to bowel motility Outcome: Completed/Met Goal: Will not experience complications related to urinary retention Outcome: Completed/Met   Problem: Pain Managment: Goal: General experience of comfort will improve Outcome: Completed/Met   Problem: Safety: Goal: Ability to remain free from injury will improve Outcome: Completed/Met   

## 2019-04-03 NOTE — Progress Notes (Signed)
Patient voiced a concern over his valuables at the motel he was staying prior to this hospital admission. Patient asked if I could give him the phone number to the [address]. I gave him their phone number and he called them using his hospital room phone.

## 2019-04-03 NOTE — Progress Notes (Signed)
PROGRESS NOTE  [name] NOI:[id number] DOB: 11-Oct-1955 DOA: 03/30/2019 PCP: Patient, No Pcp Per  HPI/Recap of past 41 hours: 63 year old male with history of insulin-dependent diabetes mellitus hypertension chronic hepatitis C polysubstance abuse chronic kidney disease stage III who was admitted with generalized weakness decreased p.o. intake nausea over the past 3 days prior to admission.  Patient reports that he is currently homeless and in need of a place to stay after here he was recently involved of his disability check he stated that they allowed him to stay in the motel for but they would not let him stay for much longer  Subjective: Patient seen and examined at bedside he is complaining of leg pain and stated that he is not a drug addict that he just wants enough pain medicine to help him with this pain also complaining of cough wants his cough medication also complaining of not hearing well on the right side right ear.  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  His WBC that was 22,000 on admission is now 7.1 today.  Patient is still on empiric antibiotics with azithromycin and ceftriaxone.  She is also on Mucinex for cough he is oxygenating well on room air  2.  Otitis media with hard of hearing on the right side with pain.  Prior examination reveals erythema of the inner ear canal with tenderness on examination we will continue antibiotic as above was ordered some eardrops  3.  Normocytic  normochromic anemia which is chronic continue CBC follow-up  4.  Insulin-dependent diabetes mellitus.  Uncontrolled his last A1c was 12.3 6 July repeat A1c March 31, 2019 is 9.1.  His blood sugars are still running in the 300s 200s occasionally will adjust his Lantus increase it to 25 units at bedtime it was increased to 20 units on 01 April 2019 continue premeal insulin NovoLog 4 units  5.  Hypertension continue amlodipine  6.  Diabetic peripheral neuropathy continue gabapentin  ' Code Status: Full  Severity of Illness: The appropriate patient status for this patient is INPATIENT. Inpatient status is judged to be reasonable and necessary in order to provide the required intensity of service to ensure the patient's safety. The patient's presenting symptoms, physical exam findings, and initial radiographic and laboratory data in the context of their chronic comorbidities is felt to place them at high risk for further clinical deterioration. Furthermore, it is not anticipated that the patient will be medically stable for discharge from the hospital within 2 midnights of admission. The following factors support the patient status of inpatient.   " The patient's presenting symptoms include sepsis due to pneumonia. " The worrisome physical exam findings include pneumonia. " The initial radiographic and laboratory data are worrisome because of pneumonia. " The chronic co-morbidities include homelessness.   * I certify that at the point of admission it is my clinical judgment that the patient will require inpatient hospital care spanning beyond 2 midnights from the point  of admission due to high intensity of service, high risk for further deterioration and high frequency of surveillance required.*    Family Communication: None at bedside  Disposition Plan: Possible nursing home placement on long-term assisted living  facility   Consultants:  None  Procedures:  None  Antimicrobials:  azithromycin and ceftriaxone DVT prophylaxis: Lovenox   Objective: Vitals:   04/02/19 1407 04/02/19 1958 04/03/19 0008 04/03/19 0522  BP: 126/61 131/62  (!) 155/61  Pulse: 76 74  70  Resp: 18 18  18   Temp: 98.6 F (37 C) 98.7 F (37.1 C)  98.1 F (36.7 C)  TempSrc: Oral Oral  Oral  SpO2: 98% 96%  99%  Weight:   69.1 kg   Height:        Intake/Output Summary (Last 24 hours) at 04/03/2019 0852 Last data filed at 04/03/2019 0656 Gross per 24 hour  Intake 1430 ml  Output --  Net 1430 ml   Filed Weights   04/01/19 0406 04/02/19 0216 04/03/19 0008  Weight: 67.3 kg 68.4 kg 69.1 kg   Body mass index is 20.67 kg/m.  Exam:  . General: 63 y.o. year-old male well developed well nourished in no acute distress.  Alert and oriented x3.  Pleasant . Cardiovascular: Regular rate and rhythm with no rubs or gallops.  No thyromegaly or JVD noted.   [name] Respiratory: Clear to auscultation with no wheezes or rales. Good inspiratory effort. . Abdomen: Soft nontender nondistended with normal bowel sounds x4 quadrants. . Musculoskeletal: No lower extremity edema. 2/4 pulses in all 4 extremities. . Skin: No ulcerative lesions noted or rashes, . Psychiatry: Mood is appropriate for condition and setting    Data Reviewed: CBC: Recent Labs  Lab 03/30/19 1141 04/01/19 0451 04/02/19 0416 04/03/19 0337  WBC 22.4* 8.2 8.3 7.1  NEUTROABS 18.7* 5.0  --   --   HGB 10.5* 10.2* 9.4* 9.4*  HCT 31.7* 31.2* 29.2* 29.4*  MCV 81.3 81.9 81.3 81.9  PLT 394 406* 402* 038*   Basic Metabolic Panel: Recent Labs  Lab 03/30/19 1141 04/01/19 0451 04/02/19 0416 04/03/19 0337  NA 133* 132* 135 135  K 3.7 4.2 4.4 4.9  CL 98 97* 102 103  CO2 25 26 25 24   GLUCOSE 102* 334* 270* 324*  BUN 15 26* 21 23  CREATININE 1.50* 1.61* 1.16 1.26*  CALCIUM 8.2* 8.3* 8.3* 8.3*  MG  --  1.8 1.7 1.8  PHOS  --  2.3*  --   --     GFR: Estimated Creatinine Clearance: 59.4 mL/min (A) (by C-G formula based on SCr of 1.26 mg/dL (H)). Liver Function Tests: Recent Labs  Lab 03/30/19 1141  AST 16  ALT 14  ALKPHOS 94  BILITOT 0.5  PROT 6.1*  ALBUMIN 1.9*   No results for input(s): LIPASE, AMYLASE in the last 168 hours. No results for input(s): AMMONIA in the last 168 hours. Coagulation Profile: No results for input(s): INR, PROTIME in the last 168 hours. Cardiac Enzymes: No results for input(s): CKTOTAL, CKMB, CKMBINDEX, TROPONINI in the last 168 hours. BNP (last 3 results) No results for input(s): PROBNP in the last 8760 hours. HbA1C: Recent Labs    04/01/19 0451  HGBA1C 9.1*   CBG: Recent Labs  Lab 04/02/19 1425 04/02/19 1706 04/02/19 2103 04/02/19 2343 04/03/19 0621  GLUCAP 320* 210* 185* 360* 252*   Lipid Profile: No results for input(s): CHOL, HDL, LDLCALC, TRIG, CHOLHDL, LDLDIRECT in the last 72 hours. Thyroid Function Tests: No  results for input(s): TSH, T4TOTAL, FREET4, T3FREE, THYROIDAB in the last 72 hours. Anemia Panel: No results for input(s): VITAMINB12, FOLATE, FERRITIN, TIBC, IRON, RETICCTPCT in the last 72 hours. Urine analysis:    Component Value Date/Time   COLORURINE YELLOW 03/31/2019 0310   APPEARANCEUR CLEAR 03/31/2019 0310   LABSPEC 1.020 03/31/2019 0310   PHURINE 6.0 03/31/2019 0310   GLUCOSEU >=500 (A) 03/31/2019 0310   GLUCOSEU 500 04/28/2012 1149   HGBUR NEGATIVE 03/31/2019 0310   BILIRUBINUR NEGATIVE 03/31/2019 0310   BILIRUBINUR neg 04/28/2012 1053   KETONESUR NEGATIVE 03/31/2019 0310   PROTEINUR 100 (A) 03/31/2019 0310   UROBILINOGEN 4.0 (H) 11/16/2013 1646   NITRITE NEGATIVE 03/31/2019 0310   LEUKOCYTESUR NEGATIVE 03/31/2019 0310   Sepsis Labs: @LABRCNTIP (procalcitonin:4,lacticidven:4)  ) Recent Results (from the past 240 hour(s))  SARS CORONAVIRUS 2 (TAT 6-24 HRS) Nasopharyngeal Nasopharyngeal Swab     Status: None   Collection Time: 03/31/19  6:45  AM   Specimen: Nasopharyngeal Swab  Result Value Ref Range Status   SARS Coronavirus 2 NEGATIVE NEGATIVE Final    Comment: (NOTE) SARS-CoV-2 target nucleic acids are NOT DETECTED. The SARS-CoV-2 RNA is generally detectable in upper and lower respiratory specimens during the acute phase of infection. Negative results do not preclude SARS-CoV-2 infection, do not rule out co-infections with other pathogens, and should not be used as the sole basis for treatment or other patient management decisions. Negative results must be combined with clinical observations, patient history, and epidemiological information. The expected result is Negative. Fact Sheet for Patients: SugarRoll.be Fact Sheet for Healthcare Providers: https://www.woods-mathews.com/ This test is not yet approved or cleared by the [place] FDA and  has been authorized for detection and/or diagnosis of SARS-CoV-2 by FDA under an Emergency Use Authorization (EUA). This EUA will remain  in effect (meaning this test can be used) for the duration of the COVID-19 declaration under Section 56 4(b)(1) of the Act, 21 U.S.C. section 360bbb-3(b)(1), unless the authorization is terminated or revoked sooner. Performed at [facility], [place] [address], [place], [place] [address]   Respiratory Panel by PCR     Status: None   Collection Time: 03/31/19  9:44 AM   Specimen: Nasopharyngeal Swab; Respiratory  Result Value Ref Range Status   Adenovirus NOT DETECTED NOT DETECTED Final   Coronavirus 229E NOT DETECTED NOT DETECTED Final    Comment: (NOTE) The Coronavirus on the Respiratory Panel, DOES NOT test for the novel  Coronavirus (2019 nCoV)    Coronavirus HKU1 NOT DETECTED NOT DETECTED Final   Coronavirus NL63 NOT DETECTED NOT DETECTED Final   Coronavirus OC43 NOT DETECTED NOT DETECTED Final   Metapneumovirus NOT DETECTED NOT DETECTED Final   Rhinovirus / Enterovirus NOT DETECTED  NOT DETECTED Final   Influenza A NOT DETECTED NOT DETECTED Final   Influenza B NOT DETECTED NOT DETECTED Final   Parainfluenza Virus 1 NOT DETECTED NOT DETECTED Final   Parainfluenza Virus 2 NOT DETECTED NOT DETECTED Final   Parainfluenza Virus 3 NOT DETECTED NOT DETECTED Final   Parainfluenza Virus 4 NOT DETECTED NOT DETECTED Final   Respiratory Syncytial Virus NOT DETECTED NOT DETECTED Final   Bordetella pertussis NOT DETECTED NOT DETECTED Final   Chlamydophila pneumoniae NOT DETECTED NOT DETECTED Final   Mycoplasma pneumoniae NOT DETECTED NOT DETECTED Final    Comment: Performed at [facility], [place]. [address], [place], [place] [address]      Studies: No results found.  Scheduled Meds: . amLODipine  5 mg Oral Daily  . enoxaparin (LOVENOX) injection  40 mg Subcutaneous Q24H  . gabapentin  200 mg Oral TID  . guaiFENesin  10 mL Oral Q6H  . insulin aspart  0-5 Units Subcutaneous QHS  . insulin aspart  0-9 Units Subcutaneous TID WC  . insulin aspart  4 Units Subcutaneous TID WC  . insulin glargine  20 Units Subcutaneous Daily  . LORazepam  1 mg Intravenous Once  . sodium chloride flush  3 mL Intravenous Once    Continuous Infusions: . sodium chloride Stopped (04/02/19 1000)  . azithromycin Stopped (04/02/19 1004)  . cefTRIAXone (ROCEPHIN)  IV Stopped (04/02/19 0850)     LOS: 3 days     Cristal Deer, MD [name]  To reach me or the doctor on call, go to: [email or web address] Password [email or web address]  04/03/2019, 8:52 AM

## 2019-04-03 NOTE — Progress Notes (Signed)
Pt's bed alarm went off, pt went to the bathroom without asking assistance from staff, this RN helped pt back in bed, pt asking for food and was told that his blood sugar needs to be check before he can have food, pt became very upset and yelled at this RN " Get out here, you bitch".

## 2019-04-03 NOTE — Progress Notes (Signed)
Pt very upset requesting food, pt had been asking for food since the start of our shift, blood sugar is 360, pt is diabetic. Pt is  requesting insulin so he can eat,  Told pt that it doesn't work that way, pt became more upset, verbally abusive, cursing staff and throwing stuff in the room. Pt was told that if he continues to throw stuff, security will be called. Charge Nurse made aware, C. Bodenheimer NP notified.

## 2019-04-04 LAB — GLUCOSE, CAPILLARY
Glucose-Capillary: 260 mg/dL — ABNORMAL HIGH (ref 70–99)
Glucose-Capillary: 77 mg/dL (ref 70–99)
Glucose-Capillary: 163 mg/dL — ABNORMAL HIGH (ref 70–99)
Glucose-Capillary: 175 mg/dL — ABNORMAL HIGH (ref 70–99)
Glucose-Capillary: 144 mg/dL — ABNORMAL HIGH (ref 70–99)

## 2019-04-04 NOTE — Progress Notes (Signed)
PROGRESS NOTE  [name] FVC:[id number] DOB: [birth year] DOA: 03/30/2019 PCP: Patient, No Pcp Per  HPI/Recap of past 77 hours: 63 year old male with history of insulin-dependent diabetes mellitus hypertension chronic hepatitis C polysubstance abuse chronic kidney disease stage III who was admitted with generalized weakness decreased p.o. intake nausea over the past 3 days prior to admission.  Patient reports that he is currently homeless and in need of a place to stay after here he was recently involved of his disability check he stated that they allowed him to stay in the motel for but they would not let him stay for much longer  Subjective: Patient seen and examined at bedside he is complaining of leg pain and stated that he is not a drug addict that he just wants enough pain medicine to help him with this pain also complaining of cough wants his cough medication also complaining of not hearing well on the right side right ear.  Also complained that he is homeless and just wants a place to to stay and that he is okay to go to rehab he told me that he was dropped off everything and has nothing left  April 06, 2019 Subjective: Patient seen and examined he is doing well today no pain other than his usual pain for which he is getting oxycodone.  He is requesting to be allowed to shower.  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  His WBC that was 22,000 on admission is now 7.1 today.  Patient is still on empiric antibiotics with azithromycin and ceftriaxone.  She is also on Mucinex for cough he is  oxygenating well on room air  2.  Otitis media with hard of hearing on the right side with pain.  Prior examination reveals erythema of the inner ear canal with tenderness on examination we will continue antibiotic as above was ordered some eardrops  3.  Normocytic normochromic anemia which is chronic continue CBC follow-up  4.  Insulin-dependent diabetes mellitus.  Uncontrolled his last A1c was 12.3 6 July repeat A1c March 31, 2019 is 9.1.  His blood sugars are still running in the 300s 200s occasionally will adjust his Lantus increase it to 25 units at bedtime it was increased to 20 units on 01 April 2019 continue premeal insulin NovoLog 4 units  5.  Hypertension continue amlodipine  6.  Diabetic peripheral neuropathy continue gabapentin  ' Code Status: Full  Severity of Illness: The appropriate patient status for this patient is INPATIENT. Inpatient status is judged to be reasonable and necessary in order to provide the required intensity of service to ensure the patient's safety. The patient's presenting symptoms, physical exam findings, and initial radiographic and laboratory data in the context of their chronic comorbidities is felt to place them at high risk for further clinical deterioration. Furthermore, it is not anticipated that the patient will be medically stable for discharge from the hospital within 2 midnights of admission. The following factors support the patient status of inpatient.   " The patient's presenting symptoms include sepsis due to pneumonia. " The worrisome physical exam findings  include pneumonia. " The initial radiographic and laboratory data are worrisome because of pneumonia. " The chronic co-morbidities include homelessness.   * I certify that at the point of admission it is my clinical judgment that the patient will require inpatient hospital care spanning beyond 2 midnights from the point of admission due to high intensity of service, high risk for  further deterioration and high frequency of surveillance required.*    Family Communication: None at bedside  Disposition Plan: Possible nursing home placement on long-term assisted living facility   Consultants:  None  Procedures:  None  Antimicrobials:  azithromycin and ceftriaxone DVT prophylaxis: Lovenox   Objective: Vitals:   04/04/19 0556 04/04/19 0612 04/04/19 0902 04/04/19 1201  BP: (!) 168/93  (!) 116/56 (!) 144/68  Pulse: 71  79 70  Resp: 18   20  Temp: 98 F (36.7 C)   98 F (36.7 C)  TempSrc: Oral   Oral  SpO2: 99%   99%  Weight:  69.9 kg    Height:        Intake/Output Summary (Last 24 hours) at 04/04/2019 1407 Last data filed at 04/04/2019 1300 Gross per 24 hour  Intake 1280 ml  Output 625 ml  Net 655 ml   Filed Weights   04/02/19 0216 04/03/19 0008 04/04/19 0612  Weight: 68.4 kg 69.1 kg 69.9 kg   Body mass index is 20.89 kg/m.  Exam:  . General: 63 y.o. year-old male well developed well nourished in no acute distress.  Alert and oriented x3.  Pleasant . Cardiovascular: Regular rate and rhythm with no rubs or gallops.  No thyromegaly or JVD noted.   [name] Respiratory: Clear to auscultation with no wheezes or rales. Good inspiratory effort. . Abdomen: Soft nontender nondistended with normal bowel sounds x4 quadrants. . Musculoskeletal: No lower extremity edema. 2/4 pulses in all 4 extremities. . Skin: No ulcerative lesions noted or rashes, . Psychiatry: Mood is appropriate for condition and setting    Data Reviewed: CBC: Recent Labs  Lab 03/30/19 1141 04/01/19 0451 04/02/19 0416 04/03/19 0337  WBC 22.4* 8.2 8.3 7.1  NEUTROABS 18.7* 5.0  --   --   HGB 10.5* 10.2* 9.4* 9.4*  HCT 31.7* 31.2* 29.2* 29.4*  MCV 81.3 81.9 81.3 81.9  PLT 394 406* 402* 096*   Basic Metabolic Panel: Recent Labs  Lab 03/30/19 1141 04/01/19 0451 04/02/19 0416 04/03/19 0337  NA 133* 132* 135 135  K 3.7 4.2 4.4 4.9  CL 98 97* 102 103  CO2 25 26 25 24    GLUCOSE 102* 334* 270* 324*  BUN 15 26* 21 23  CREATININE 1.50* 1.61* 1.16 1.26*  CALCIUM 8.2* 8.3* 8.3* 8.3*  MG  --  1.8 1.7 1.8  PHOS  --  2.3*  --   --    GFR: Estimated Creatinine Clearance: 60.1 mL/min (A) (by C-G formula based on SCr of 1.26 mg/dL (H)). Liver Function Tests: Recent Labs  Lab 03/30/19 1141  AST 16  ALT 14  ALKPHOS 94  BILITOT 0.5  PROT 6.1*  ALBUMIN 1.9*   No results for input(s): LIPASE, AMYLASE in the last 168 hours. No results for input(s): AMMONIA in the last 168 hours. Coagulation Profile: No results for input(s): INR, PROTIME in the last 168 hours. Cardiac Enzymes: No results for input(s): CKTOTAL, CKMB, CKMBINDEX, TROPONINI in the last 168 hours. BNP (last 3 results) No results for input(s): PROBNP in the last 8760 hours. HbA1C: No results for input(s): HGBA1C in  the last 72 hours. CBG: Recent Labs  Lab 04/03/19 1118 04/03/19 1731 04/03/19 2002 04/04/19 0601 04/04/19 1337  GLUCAP 158* 210* 175* 260* 77   Lipid Profile: No results for input(s): CHOL, HDL, LDLCALC, TRIG, CHOLHDL, LDLDIRECT in the last 72 hours. Thyroid Function Tests: No results for input(s): TSH, T4TOTAL, FREET4, T3FREE, THYROIDAB in the last 72 hours. Anemia Panel: No results for input(s): VITAMINB12, FOLATE, FERRITIN, TIBC, IRON, RETICCTPCT in the last 72 hours. Urine analysis:    Component Value Date/Time   COLORURINE YELLOW 03/31/2019 0310   APPEARANCEUR CLEAR 03/31/2019 0310   LABSPEC 1.020 03/31/2019 0310   PHURINE 6.0 03/31/2019 0310   GLUCOSEU >=500 (A) 03/31/2019 0310   GLUCOSEU 500 04/28/2012 1149   HGBUR NEGATIVE 03/31/2019 0310   BILIRUBINUR NEGATIVE 03/31/2019 0310   BILIRUBINUR neg 04/28/2012 1053   KETONESUR NEGATIVE 03/31/2019 0310   PROTEINUR 100 (A) 03/31/2019 0310   UROBILINOGEN 4.0 (H) 11/16/2013 1646   NITRITE NEGATIVE 03/31/2019 0310   LEUKOCYTESUR NEGATIVE 03/31/2019 0310   Sepsis  Labs: @LABRCNTIP (procalcitonin:4,lacticidven:4)  ) Recent Results (from the past 240 hour(s))  SARS CORONAVIRUS 2 (TAT 6-24 HRS) Nasopharyngeal Nasopharyngeal Swab     Status: None   Collection Time: 03/31/19  6:45 AM   Specimen: Nasopharyngeal Swab  Result Value Ref Range Status   SARS Coronavirus 2 NEGATIVE NEGATIVE Final    Comment: (NOTE) SARS-CoV-2 target nucleic acids are NOT DETECTED. The SARS-CoV-2 RNA is generally detectable in upper and lower respiratory specimens during the acute phase of infection. Negative results do not preclude SARS-CoV-2 infection, do not rule out co-infections with other pathogens, and should not be used as the sole basis for treatment or other patient management decisions. Negative results must be combined with clinical observations, patient history, and epidemiological information. The expected result is Negative. Fact Sheet for Patients: SugarRoll.be Fact Sheet for Healthcare Providers: https://www.woods-mathews.com/ This test is not yet approved or cleared by the [place] FDA and  has been authorized for detection and/or diagnosis of SARS-CoV-2 by FDA under an Emergency Use Authorization (EUA). This EUA will remain  in effect (meaning this test can be used) for the duration of the COVID-19 declaration under Section 56 4(b)(1) of the Act, 21 U.S.C. section 360bbb-3(b)(1), unless the authorization is terminated or revoked sooner. Performed at [facility], [place] [address] [place], [place] [address]   Respiratory Panel by PCR     Status: None   Collection Time: 03/31/19  9:44 AM   Specimen: Nasopharyngeal Swab; Respiratory  Result Value Ref Range Status   Adenovirus NOT DETECTED NOT DETECTED Final   Coronavirus 229E NOT DETECTED NOT DETECTED Final    Comment: (NOTE) The Coronavirus on the Respiratory Panel, DOES NOT test for the novel  Coronavirus (2019 nCoV)    Coronavirus HKU1 NOT  DETECTED NOT DETECTED Final   Coronavirus NL63 NOT DETECTED NOT DETECTED Final   Coronavirus OC43 NOT DETECTED NOT DETECTED Final   Metapneumovirus NOT DETECTED NOT DETECTED Final   Rhinovirus / Enterovirus NOT DETECTED NOT DETECTED Final   Influenza A NOT DETECTED NOT DETECTED Final   Influenza B NOT DETECTED NOT DETECTED Final   Parainfluenza Virus 1 NOT DETECTED NOT DETECTED Final   Parainfluenza Virus 2 NOT DETECTED NOT DETECTED Final   Parainfluenza Virus 3 NOT DETECTED NOT DETECTED Final   Parainfluenza Virus 4 NOT DETECTED NOT DETECTED Final   Respiratory Syncytial Virus NOT DETECTED NOT DETECTED Final   Bordetella pertussis NOT DETECTED NOT DETECTED Final  Chlamydophila pneumoniae NOT DETECTED NOT DETECTED Final   Mycoplasma pneumoniae NOT DETECTED NOT DETECTED Final    Comment: Performed at [facility], [place] [address] [place], [place] [address]      Studies: No results found.  Scheduled Meds: . amLODipine  5 mg Oral Daily  . enoxaparin (LOVENOX) injection  40 mg Subcutaneous Q24H  . gabapentin  200 mg Oral TID  . guaiFENesin  10 mL Oral Q6H  . insulin aspart  0-5 Units Subcutaneous QHS  . insulin aspart  0-9 Units Subcutaneous TID WC  . insulin aspart  4 Units Subcutaneous TID WC  . insulin glargine  25 Units Subcutaneous Daily  . LORazepam  1 mg Intravenous Once  . neomycin-polymyxin-hydrocortisone  4 drop Right EAR Q8H    Continuous Infusions: . sodium chloride Stopped (04/02/19 1000)     LOS: 4 days     Cristal Deer, MD [name]  To reach me or the doctor on call, go to: [email or web address] Password [email or web address]  04/04/2019, 2:07 PM

## 2019-04-04 NOTE — Progress Notes (Signed)
Patient Blood sugar was 77-  Patient did not like lunch- did not eat.   Held Sliding scale and meal coverage insulin

## 2019-04-05 DIAGNOSIS — Z720 Tobacco use: Secondary | ICD-10-CM

## 2019-04-05 DIAGNOSIS — E1065 Type 1 diabetes mellitus with hyperglycemia: Secondary | ICD-10-CM

## 2019-04-05 DIAGNOSIS — E1029 Type 1 diabetes mellitus with other diabetic kidney complication: Secondary | ICD-10-CM

## 2019-04-05 LAB — GLUCOSE, CAPILLARY
Glucose-Capillary: 305 mg/dL — ABNORMAL HIGH (ref 70–99)
Glucose-Capillary: 102 mg/dL — ABNORMAL HIGH (ref 70–99)
Glucose-Capillary: 43 mg/dL — CL (ref 70–99)
Glucose-Capillary: 263 mg/dL — ABNORMAL HIGH (ref 70–99)
Glucose-Capillary: 164 mg/dL — ABNORMAL HIGH (ref 70–99)
Glucose-Capillary: 170 mg/dL — ABNORMAL HIGH (ref 70–99)

## 2019-04-05 LAB — IRON AND TIBC
Iron: 20 ug/dL — ABNORMAL LOW (ref 45–182)
Saturation Ratios: 6 % — ABNORMAL LOW (ref 17.9–39.5)
TIBC: 315 ug/dL (ref 250–450)
UIBC: 295 ug/dL

## 2019-04-05 LAB — RETICULOCYTES
Immature Retic Fract: 16.6 % — ABNORMAL HIGH (ref 2.3–15.9)
RBC.: 4 MIL/uL — ABNORMAL LOW (ref 4.22–5.81)
Retic Count, Absolute: 30 10*3/uL (ref 19.0–186.0)
Retic Ct Pct: 0.8 % (ref 0.4–3.1)

## 2019-04-05 LAB — FERRITIN: Ferritin: 36 ng/mL (ref 24–336)

## 2019-04-05 LAB — FOLATE: Folate: 8.4 ng/mL (ref >5.9)

## 2019-04-05 LAB — VITAMIN B12: Vitamin B-12: 407 pg/mL (ref 180–914)

## 2019-04-05 MED ORDER — GABAPENTIN 300 MG PO CAPS
300.0000 mg | ORAL_CAPSULE | Freq: Every day | ORAL | Status: DC
  Filled 2019-04-05: qty 1

## 2019-04-05 MED ORDER — GABAPENTIN 300 MG PO CAPS
300.0000 mg | ORAL_CAPSULE | ORAL | Status: DC
  Administered 2019-04-05 – 2019-04-06 (×3): 300 mg via ORAL
  Filled 2019-04-05 (×2): qty 1

## 2019-04-05 MED ORDER — NICOTINE 21 MG/24HR TD PT24
21.0000 mg | MEDICATED_PATCH | Freq: Every day | TRANSDERMAL | Status: DC
  Administered 2019-04-05 – 2019-04-23 (×18): 21 mg via TRANSDERMAL
  Filled 2019-04-05 (×19): qty 1

## 2019-04-05 MED ORDER — INSULIN ASPART 100 UNIT/ML ~~LOC~~ SOLN
5.0000 [IU] | Freq: Once | SUBCUTANEOUS | Status: AC
  Administered 2019-04-05: 5 [IU] via SUBCUTANEOUS

## 2019-04-05 MED ORDER — INSULIN ASPART 100 UNIT/ML ~~LOC~~ SOLN
0.0000 [IU] | Freq: Every day | SUBCUTANEOUS | Status: DC
  Administered 2019-04-06: 23:00:00 3 [IU] via SUBCUTANEOUS
  Administered 2019-04-08: 23:00:00 2 [IU] via SUBCUTANEOUS
  Administered 2019-04-09 – 2019-04-10 (×2): 3 [IU] via SUBCUTANEOUS
  Administered 2019-04-12 – 2019-04-14 (×2): 2 [IU] via SUBCUTANEOUS
  Administered 2019-04-15 – 2019-04-16 (×2): 4 [IU] via SUBCUTANEOUS
  Administered 2019-04-17: 5 [IU] via SUBCUTANEOUS
  Administered 2019-04-18: 2 [IU] via SUBCUTANEOUS
  Administered 2019-04-21: 6 [IU] via SUBCUTANEOUS

## 2019-04-05 MED ORDER — INSULIN ASPART 100 UNIT/ML ~~LOC~~ SOLN
0.0000 [IU] | Freq: Three times a day (TID) | SUBCUTANEOUS | Status: DC
  Administered 2019-04-05: 3 [IU] via SUBCUTANEOUS
  Administered 2019-04-06: 8 [IU] via SUBCUTANEOUS
  Administered 2019-04-07: 18:00:00 5 [IU] via SUBCUTANEOUS
  Administered 2019-04-07: 3 [IU] via SUBCUTANEOUS
  Administered 2019-04-08: 5 [IU] via SUBCUTANEOUS
  Administered 2019-04-09: 2 [IU] via SUBCUTANEOUS
  Administered 2019-04-10: 3 [IU] via SUBCUTANEOUS
  Administered 2019-04-10: 2 [IU] via SUBCUTANEOUS
  Administered 2019-04-10: 8 [IU] via SUBCUTANEOUS
  Administered 2019-04-11: 3 [IU] via SUBCUTANEOUS
  Administered 2019-04-11: 8 [IU] via SUBCUTANEOUS
  Administered 2019-04-12: 3 [IU] via SUBCUTANEOUS

## 2019-04-05 MED ORDER — ATORVASTATIN CALCIUM 40 MG PO TABS
40.0000 mg | ORAL_TABLET | Freq: Every day | ORAL | Status: DC
  Administered 2019-04-05 – 2019-04-22 (×17): 40 mg via ORAL
  Filled 2019-04-05 (×18): qty 1

## 2019-04-05 MED ORDER — LOSARTAN POTASSIUM 25 MG PO TABS
25.0000 mg | ORAL_TABLET | Freq: Every day | ORAL | Status: DC
  Administered 2019-04-05 – 2019-04-06 (×2): 25 mg via ORAL
  Filled 2019-04-05 (×2): qty 1

## 2019-04-05 MED ORDER — GABAPENTIN 300 MG PO CAPS
300.0000 mg | ORAL_CAPSULE | Freq: Three times a day (TID) | ORAL | Status: DC
  Administered 2019-04-05: 300 mg via ORAL
  Filled 2019-04-05: qty 1

## 2019-04-05 NOTE — Progress Notes (Addendum)
PROGRESS NOTE  [name] ZSW:[id number] DOB: March 24, 1956   PCP: Patient, No Pcp Per  Patient is from: Homeless  DOA: 03/30/2019 LOS: 5  Brief Narrative / Interim history: 63 year old homeless male with history of IDDM-2, HTN, chronic hepatitis C, polysubstance abuse, and chronic pain presented with generalized weakness, nausea and poor p.o. intake for 3 days and admitted for sepsis due to CAP and right acute otitis media.  Started on ceftriaxone and azithromycin.  Subjective: No major events overnight of this morning.  Continues to endorse pain all over mainly in his legs.  Describes his pain in his legs as pins-and-needles.  Denies chest pain, shortness of breath, GI or UTI symptoms.  Objective: Vitals:   04/04/19 2010 04/05/19 0210 04/05/19 0438 04/05/19 1019  BP:  (!) 171/65 (!) 151/72 (!) 178/79  Pulse: 75 72 75 76  Resp:   19   Temp: 97.6 F (36.4 C)  97.9 F (36.6 C)   TempSrc: Oral  Oral   SpO2: 97%  97%   Weight:   69.9 kg   Height:        Intake/Output Summary (Last 24 hours) at 04/05/2019 1201 Last data filed at 04/05/2019 0900 Gross per 24 hour  Intake 1140 ml  Output 1175 ml  Net -35 ml   Filed Weights   04/03/19 0008 04/04/19 0612 04/05/19 0438  Weight: 69.1 kg 69.9 kg 69.9 kg    Examination:  GENERAL: No acute distress.  Appears well.  HEENT: MMM.  Vision and hearing grossly intact.  NECK: Supple.  No apparent JVD.  RESP:  No IWOB. Good air movement bilaterally. CVS:  RRR. Heart sounds normal.  ABD/GI/GU: Bowel sounds present. Soft. Non tender.  MSK/EXT:  No apparent deformity or edema. Moves extremities. SKIN: Diffuse vitiligo NEURO: Awake, alert and oriented appropriately.  No gross deficit.  PSYCH: Calm. Normal affect.  Procedures:  None  Assessment & Plan: Sepsis due to CAP: Sepsis resolved.  COVID-19 and RVP negative.  No respiratory distress. -Completed ceftriaxone and azithromycin for CAP 12/16-12/21.  Acute otitis media of right  ear -Antibiotics as above.  Uncontrolled type I diabetes with hyperglycemia and diabetic neuropathy Recent Labs    04/05/19 0223 04/05/19 0611 04/05/19 1200  GLUCAP 305* 102* 263*  -Continue Lantus 25 units daily -NovoLog 4 units AC -Increase SSI to moderate with nightly coverage -Start a statin -Increase gabapentin 300 mg 3 times daily with additional 300 mg at bedtime  Essential hypertension: BP elevated. -Continue amlodipine 5 mg daily -Start low-dose losartan-would benefit given diabetes.  Normocytic anemia: Hgb 11-13 (baseline)> 10.2 (admit)>> 9.4 -Check anemia panel  History of chronic hepatitis C: HCV RNA undetectable in 12/2016 and 04/2017.  Tobacco use disorder: Reports smoking about a pack a day. -Encourage cessation -Nicotine patch  Alcohol abuse in remission -Congratulated.  Hyponatremia: Resolved.  AKI: Resolved.  Homelessness -TOC team on board.                 DVT prophylaxis: Subcu Lovenox Code Status: Full code full code Family Communication: Patient and/or RN. Available if any question.  Disposition Plan: Final disposition SNF. Consultants: None   Microbiology summarized: RVP negative. COVID-19 negative.  Sch Meds:  Scheduled Meds: . amLODipine  5 mg Oral Daily  . enoxaparin (LOVENOX) injection  40 mg Subcutaneous Q24H  . gabapentin  200 mg Oral TID  . guaiFENesin  10 mL Oral Q6H  . insulin aspart  0-5 Units Subcutaneous QHS  . insulin aspart  0-9  Units Subcutaneous TID WC  . insulin aspart  4 Units Subcutaneous TID WC  . insulin glargine  25 Units Subcutaneous Daily  . LORazepam  1 mg Intravenous Once  . neomycin-polymyxin-hydrocortisone  4 drop Right EAR Q8H   Continuous Infusions: . sodium chloride Stopped (04/02/19 1000)   PRN Meds:.sodium chloride, acetaminophen, HYDROcodone-acetaminophen  Antimicrobials: Anti-infectives (From admission, onward)   Start     Dose/Rate Route Frequency Ordered Stop   04/01/19 0900   azithromycin (ZITHROMAX) 500 mg in sodium chloride 0.9 % 250 mL IVPB     500 mg 250 mL/hr over 60 Minutes Intravenous Every 24 hours 03/31/19 0822 04/05/19 0810   04/01/19 0800  cefTRIAXone (ROCEPHIN) 2 g in sodium chloride 0.9 % 100 mL IVPB     2 g 200 mL/hr over 30 Minutes Intravenous Every 24 hours 03/31/19 0822 04/05/19 0811   03/31/19 0615  cefTRIAXone (ROCEPHIN) 1 g in sodium chloride 0.9 % 100 mL IVPB     1 g 200 mL/hr over 30 Minutes Intravenous  Once 03/31/19 0610 03/31/19 0935   03/31/19 0615  azithromycin (ZITHROMAX) 500 mg in sodium chloride 0.9 % 250 mL IVPB     500 mg 250 mL/hr over 60 Minutes Intravenous  Once 03/31/19 0610 03/31/19 1211   03/31/19 0245  doxycycline (VIBRA-TABS) tablet 100 mg     100 mg Oral  Once 03/31/19 0232 03/31/19 0408       I have personally reviewed the following labs and images: CBC: Recent Labs  Lab 03/30/19 1141 04/01/19 0451 04/02/19 0416 04/03/19 0337  WBC 22.4* 8.2 8.3 7.1  NEUTROABS 18.7* 5.0  --   --   HGB 10.5* 10.2* 9.4* 9.4*  HCT 31.7* 31.2* 29.2* 29.4*  MCV 81.3 81.9 81.3 81.9  PLT 394 406* 402* 443*   BMP &GFR Recent Labs  Lab 03/30/19 1141 04/01/19 0451 04/02/19 0416 04/03/19 0337  NA 133* 132* 135 135  K 3.7 4.2 4.4 4.9  CL 98 97* 102 103  CO2 25 26 25 24   GLUCOSE 102* 334* 270* 324*  BUN 15 26* 21 23  CREATININE 1.50* 1.61* 1.16 1.26*  CALCIUM 8.2* 8.3* 8.3* 8.3*  MG  --  1.8 1.7 1.8  PHOS  --  2.3*  --   --    Estimated Creatinine Clearance: 60.1 mL/min (A) (by C-G formula based on SCr of 1.26 mg/dL (H)). Liver & Pancreas: Recent Labs  Lab 03/30/19 1141  AST 16  ALT 14  ALKPHOS 94  BILITOT 0.5  PROT 6.1*  ALBUMIN 1.9*   No results for input(s): LIPASE, AMYLASE in the last 168 hours. No results for input(s): AMMONIA in the last 168 hours. Diabetic: No results for input(s): HGBA1C in the last 72 hours. Recent Labs  Lab 04/04/19 1551 04/04/19 1619 04/04/19 2016 04/05/19 0223 04/05/19 0611   GLUCAP 163* 175* 144* 305* 102*   Cardiac Enzymes: No results for input(s): CKTOTAL, CKMB, CKMBINDEX, TROPONINI in the last 168 hours. No results for input(s): PROBNP in the last 8760 hours. Coagulation Profile: No results for input(s): INR, PROTIME in the last 168 hours. Thyroid Function Tests: No results for input(s): TSH, T4TOTAL, FREET4, T3FREE, THYROIDAB in the last 72 hours. Lipid Profile: No results for input(s): CHOL, HDL, LDLCALC, TRIG, CHOLHDL, LDLDIRECT in the last 72 hours. Anemia Panel: No results for input(s): VITAMINB12, FOLATE, FERRITIN, TIBC, IRON, RETICCTPCT in the last 72 hours. Urine analysis:    Component Value Date/Time   COLORURINE YELLOW 03/31/2019 0310  APPEARANCEUR CLEAR 03/31/2019 0310   LABSPEC 1.020 03/31/2019 0310   PHURINE 6.0 03/31/2019 0310   GLUCOSEU >=500 (A) 03/31/2019 0310   GLUCOSEU 500 04/28/2012 1149   HGBUR NEGATIVE 03/31/2019 0310   BILIRUBINUR NEGATIVE 03/31/2019 0310   BILIRUBINUR neg 04/28/2012 1053   KETONESUR NEGATIVE 03/31/2019 0310   PROTEINUR 100 (A) 03/31/2019 0310   UROBILINOGEN 4.0 (H) 11/16/2013 1646   NITRITE NEGATIVE 03/31/2019 0310   LEUKOCYTESUR NEGATIVE 03/31/2019 0310   Sepsis Labs: Invalid input(s): PROCALCITONIN, [place]  Microbiology: Recent Results (from the past 240 hour(s))  SARS CORONAVIRUS 2 (TAT 6-24 HRS) Nasopharyngeal Nasopharyngeal Swab     Status: None   Collection Time: 03/31/19  6:45 AM   Specimen: Nasopharyngeal Swab  Result Value Ref Range Status   SARS Coronavirus 2 NEGATIVE NEGATIVE Final    Comment: (NOTE) SARS-CoV-2 target nucleic acids are NOT DETECTED. The SARS-CoV-2 RNA is generally detectable in upper and lower respiratory specimens during the acute phase of infection. Negative results do not preclude SARS-CoV-2 infection, do not rule out co-infections with other pathogens, and should not be used as the sole basis for treatment or other patient management decisions. Negative  results must be combined with clinical observations, patient history, and epidemiological information. The expected result is Negative. Fact Sheet for Patients: SugarRoll.be Fact Sheet for Healthcare Providers: https://www.woods-mathews.com/ This test is not yet approved or cleared by the [place] FDA and  has been authorized for detection and/or diagnosis of SARS-CoV-2 by FDA under an Emergency Use Authorization (EUA). This EUA will remain  in effect (meaning this test can be used) for the duration of the COVID-19 declaration under Section 56 4(b)(1) of the Act, 21 U.S.C. section 360bbb-3(b)(1), unless the authorization is terminated or revoked sooner. Performed at [facility], [place] [address] [place], [place] [address]   Respiratory Panel by PCR     Status: None   Collection Time: 03/31/19  9:44 AM   Specimen: Nasopharyngeal Swab; Respiratory  Result Value Ref Range Status   Adenovirus NOT DETECTED NOT DETECTED Final   Coronavirus 229E NOT DETECTED NOT DETECTED Final    Comment: (NOTE) The Coronavirus on the Respiratory Panel, DOES NOT test for the novel  Coronavirus (2019 nCoV)    Coronavirus HKU1 NOT DETECTED NOT DETECTED Final   Coronavirus NL63 NOT DETECTED NOT DETECTED Final   Coronavirus OC43 NOT DETECTED NOT DETECTED Final   Metapneumovirus NOT DETECTED NOT DETECTED Final   Rhinovirus / Enterovirus NOT DETECTED NOT DETECTED Final   Influenza A NOT DETECTED NOT DETECTED Final   Influenza B NOT DETECTED NOT DETECTED Final   Parainfluenza Virus 1 NOT DETECTED NOT DETECTED Final   Parainfluenza Virus 2 NOT DETECTED NOT DETECTED Final   Parainfluenza Virus 3 NOT DETECTED NOT DETECTED Final   Parainfluenza Virus 4 NOT DETECTED NOT DETECTED Final   Respiratory Syncytial Virus NOT DETECTED NOT DETECTED Final   Bordetella pertussis NOT DETECTED NOT DETECTED Final   Chlamydophila pneumoniae NOT DETECTED NOT DETECTED Final    Mycoplasma pneumoniae NOT DETECTED NOT DETECTED Final    Comment: Performed at [facility], [place]. [address] [place], [place] [address]    Radiology Studies: No results found.    Taye T. [place]  If 7PM-7AM, please contact night-coverage [email or web address] Password [email or web address] 04/05/2019, 12:01 PM

## 2019-04-05 NOTE — TOC Progression Note (Signed)
Transition of Care [facility]) - Progression Note    Patient Details  Name: [name] MRN: [id number] Date of Birth: 1955-10-21  Transition of Care [facility]) CM/SW [place], [place] Phone Number: 04/05/2019, 12:30 PM  Clinical Narrative:     CSW met with the patient at bedside. Patient stated he stayed at [facility] for four months already this year. He is unsure of how many days he used. He would be in co-pay days. CSW stated if he used all of his medicare days then he would have to private. Patient stated he has Medicaid as well. He stated his belongings were still at the motel he was staying at.   Patient chose [facility]. CSW stated she would contact [facility] to see if they would be able to accept him as a medicaid.   CSW contacted [place], [place] and [facility] for them to look at. CSW is awaiting verification of bed. CSW will continue to follow and assist with disposition planning.   Expected Discharge Plan: Skilled Nursing Facility Barriers to Discharge: Homeless with medical needs, SNF Pending bed offer  Expected Discharge Plan and Services Expected Discharge Plan: [place] arrangements for the past 2 months: Hotel/Motel([employer] [facility])                                       Social Determinants of Health (SDOH) Interventions    Readmission Risk Interventions Readmission Risk Prevention Plan 08/12/2018  Transportation Screening Complete  Medication Review Press photographer) Complete  PCP or Specialist appointment within 3-5 days of discharge Complete  HRI or [place] Complete  SW Recovery Care/Counseling Consult Complete  [place] Not Applicable  Some recent data might be hidden

## 2019-04-05 NOTE — Progress Notes (Signed)
Occupational Therapy Treatment Patient Details Name: [name] MRN: [id number] DOB: [birth year] Today's Date: 04/05/2019    History of present illness Pt is a 63 y/o male with PMH of DM, HTN, chronic hepatitis C, polysubstance abuse, CKD III, presents with generalized weakness, decreased PO intake, and nausea over the last 3 days. Found with sepsis secondary to community acquired pneumonia.    OT comments  Pt had just showered with nursing staff and seated at EOB with paper scrubs donned and somewhat agitated. Ambulated with pt and RW down hallway and talked with him which successfully calmed him. Pt toileted and washed his hands with min guard assist and remained up in chair eating a snack at end of session. Continue to recommend SNF.  Follow Up Recommendations  SNF    Equipment Recommendations  3 in 1 bedside commode    Recommendations for Other Services      Precautions / Restrictions Precautions Precautions: Fall       Mobility Bed Mobility               General bed mobility comments: sitting at EOB upon arrival  Transfers   Equipment used: Rolling walker (2 wheeled) Transfers: Sit to/from Stand Sit to Stand: Supervision              Balance Overall balance assessment: Needs assistance   Sitting balance-Leahy Scale: Good       Standing balance-Leahy Scale: Fair Standing balance comment: fair but less than fair dynamically                           ADL either performed or assessed with clinical judgement   ADL Overall ADL's : Needs assistance/impaired Eating/Feeding: Independent;Sitting   Grooming: Brushing hair;Standing;Min guard                   Toilet Transfer: Min guard;Ambulation Toilet Transfer Details (indicate cue type and reason): stood to urinate Toileting- Water quality scientist and Hygiene: Supervision/safety;Sit to/from stand       Functional mobility during ADLs: Min guard;Rolling walker(assisted to keep paper  pants from falling off) General ADL Comments: walked full length of nursing unit and back to his room with min guard assist and no breaks     Vision       Perception     Praxis      Cognition Arousal/Alertness: Awake/alert Behavior During Therapy: WFL for tasks assessed/performed Overall Cognitive Status: No family/caregiver present to determine baseline cognitive functioning Area of Impairment: Safety/judgement                         Safety/Judgement: Decreased awareness of safety;Decreased awareness of deficits     General Comments: pt focused on his social and drug history        Exercises     Shoulder Instructions       General Comments      Pertinent Vitals/ Pain       Pain Assessment: No/denies pain  Home Living                                          Prior Functioning/Environment              Frequency  Min 2X/week        Progress Toward Goals  OT Goals(current goals  can now be found in the care plan section)  Progress towards OT goals: Progressing toward goals  Acute Rehab OT Goals Patient Stated Goal: to get stronger and get life together OT Goal Formulation: With patient Time For Goal Achievement: 04/16/19 Potential to Achieve Goals: Good  Plan Discharge plan remains appropriate    Co-evaluation                 AM-PAC OT "6 Clicks" Daily Activity     Outcome Measure   Help from another person eating meals?: None Help from another person taking care of personal grooming?: A Little Help from another person toileting, which includes using toliet, bedpan, or urinal?: A Little Help from another person bathing (including washing, rinsing, drying)?: A Little Help from another person to put on and taking off regular upper body clothing?: A Little Help from another person to put on and taking off regular lower body clothing?: None 6 Click Score: 20    End of Session    OT Visit Diagnosis: Other  abnormalities of gait and mobility (R26.89);Muscle weakness (generalized) (M62.81)   Activity Tolerance     Patient Left in chair;with call bell/phone within reach;with chair alarm set   Nurse Communication          Time: 3943-2003 OT Time Calculation (min): 44 min  Charges: OT General Charges $OT Visit: 1 Visit OT Treatments $Self Care/Home Management : 38-52 mins  [name], OTR/L Acute Rehabilitation Services Pager: [phone] Office: [phone]   [name] 04/05/2019, 3:17 PM

## 2019-04-05 NOTE — Plan of Care (Signed)
  Problem: Education: Goal: Knowledge of General Education information will improve Description: Including pain rating scale, medication(s)/side effects and non-pharmacologic comfort measures Outcome: Progressing   Problem: Health Behavior/Discharge Planning: Goal: Ability to manage health-related needs will improve Outcome: Progressing   Problem: Clinical Measurements: Goal: Ability to maintain clinical measurements within normal limits will improve Outcome: Progressing Goal: Will remain free from infection Outcome: Progressing Goal: Diagnostic test results will improve Outcome: Progressing Goal: Respiratory complications will improve Outcome: Progressing Goal: Cardiovascular complication will be avoided Outcome: Progressing   Problem: Activity: Goal: Risk for activity intolerance will decrease Outcome: Progressing   Problem: Nutrition: Goal: Adequate nutrition will be maintained Outcome: Progressing   Problem: Coping: Goal: Level of anxiety will decrease Outcome: Progressing   Problem: Skin Integrity: Goal: Risk for impaired skin integrity will decrease Outcome: Progressing

## 2019-04-05 NOTE — Progress Notes (Signed)
Patient complaining of pain that has not been relieved by the prn norco.  Patient does not want the tylenol offered.  TRH notified.

## 2019-04-05 NOTE — Progress Notes (Signed)
Patient would like to speak to a social work in regards to bed placement. Tried Financial controller on duty. Will try again soon.

## 2019-04-06 DIAGNOSIS — D509 Iron deficiency anemia, unspecified: Secondary | ICD-10-CM

## 2019-04-06 LAB — BASIC METABOLIC PANEL
Anion gap: 9 (ref 5–15)
BUN: 30 mg/dL — ABNORMAL HIGH (ref 8–23)
CO2: 21 mmol/L — ABNORMAL LOW (ref 22–32)
Calcium: 8.5 mg/dL — ABNORMAL LOW (ref 8.9–10.3)
Chloride: 101 mmol/L (ref 98–111)
Creatinine, Ser: 1.16 mg/dL (ref 0.61–1.24)
GFR calc Af Amer: >60 mL/min (ref >60)
GFR calc non Af Amer: >60 mL/min (ref >60)
Glucose, Bld: 332 mg/dL — ABNORMAL HIGH (ref 70–99)
Potassium: 5 mmol/L (ref 3.5–5.1)
Sodium: 131 mmol/L — ABNORMAL LOW (ref 135–145)

## 2019-04-06 LAB — GLUCOSE, CAPILLARY
Glucose-Capillary: 268 mg/dL — ABNORMAL HIGH (ref 70–99)
Glucose-Capillary: 113 mg/dL — ABNORMAL HIGH (ref 70–99)
Glucose-Capillary: 119 mg/dL — ABNORMAL HIGH (ref 70–99)
Glucose-Capillary: 292 mg/dL — ABNORMAL HIGH (ref 70–99)

## 2019-04-06 LAB — CBC
HCT: 29.4 % — ABNORMAL LOW (ref 39.0–52.0)
Hemoglobin: 9.7 g/dL — ABNORMAL LOW (ref 13.0–17.0)
MCH: 26.6 pg (ref 26.0–34.0)
MCHC: 33 g/dL (ref 30.0–36.0)
MCV: 80.8 fL (ref 80.0–100.0)
Platelets: 416 10*3/uL — ABNORMAL HIGH (ref 150–400)
RBC: 3.64 MIL/uL — ABNORMAL LOW (ref 4.22–5.81)
RDW: 14.5 % (ref 11.5–15.5)
WBC: 7.7 10*3/uL (ref 4.0–10.5)
nRBC: 0 % (ref 0.0–0.2)

## 2019-04-06 LAB — MAGNESIUM: Magnesium: 1.9 mg/dL (ref 1.7–2.4)

## 2019-04-06 MED ORDER — GABAPENTIN 300 MG PO CAPS
600.0000 mg | ORAL_CAPSULE | Freq: Every day | ORAL | Status: DC
  Administered 2019-04-07 – 2019-04-22 (×17): 600 mg via ORAL
  Filled 2019-04-06 (×17): qty 2

## 2019-04-06 MED ORDER — GABAPENTIN 300 MG PO CAPS
300.0000 mg | ORAL_CAPSULE | Freq: Three times a day (TID) | ORAL | Status: DC
  Administered 2019-04-06: 16:00:00 300 mg via ORAL
  Filled 2019-04-06: qty 1

## 2019-04-06 MED ORDER — GABAPENTIN 300 MG PO CAPS: 600.0000 mg | ORAL_CAPSULE | Freq: Every day | ORAL | Status: DC

## 2019-04-06 MED ORDER — OXYCODONE-ACETAMINOPHEN 5-325 MG PO TABS
1.0000 | ORAL_TABLET | Freq: Four times a day (QID) | ORAL | Status: DC | PRN
  Administered 2019-04-06 – 2019-04-12 (×24): 2 via ORAL
  Filled 2019-04-06 (×24): qty 2

## 2019-04-06 MED ORDER — SENNOSIDES-DOCUSATE SODIUM 8.6-50 MG PO TABS: 1.0000 | ORAL_TABLET | Freq: Two times a day (BID) | ORAL | Status: DC | PRN

## 2019-04-06 MED ORDER — SODIUM CHLORIDE 0.9 % IV SOLN
510.0000 mg | Freq: Once | INTRAVENOUS | Status: AC
  Administered 2019-04-06: 510 mg via INTRAVENOUS
  Filled 2019-04-06: qty 17

## 2019-04-06 MED ORDER — GABAPENTIN 300 MG PO CAPS
300.0000 mg | ORAL_CAPSULE | ORAL | Status: DC
  Administered 2019-04-06 – 2019-04-12 (×19): 300 mg via ORAL
  Filled 2019-04-06 (×19): qty 1

## 2019-04-06 MED ORDER — GABAPENTIN 300 MG PO CAPS: 300.0000 mg | ORAL_CAPSULE | ORAL | Status: DC

## 2019-04-06 MED ORDER — FERROUS SULFATE 325 (65 FE) MG PO TABS
325.0000 mg | ORAL_TABLET | Freq: Two times a day (BID) | ORAL | Status: DC
  Administered 2019-04-06 – 2019-04-20 (×27): 325 mg via ORAL
  Filled 2019-04-06 (×33): qty 1

## 2019-04-06 NOTE — Progress Notes (Signed)
Sent additional page to [facility] regarding patient pain medication.

## 2019-04-06 NOTE — Care Management Important Message (Signed)
Important Message  Patient Details  Name: PROPHET RENWICK MRN: [id number] Date of Birth: 24-Jan-1956   Medicare Important Message Given:  Yes     [name] 04/06/2019, 1:34 PM

## 2019-04-06 NOTE — Progress Notes (Signed)
Physical Therapy Treatment Patient Details Name: [name] MRN: [id number] DOB: January 19, 1956 Today's Date: 04/06/2019    History of Present Illness Pt is a 63 y/o male with PMH of DM, HTN, chronic hepatitis C, polysubstance abuse, CKD III, presents with generalized weakness, decreased PO intake, and nausea over the last 3 days. Found with sepsis secondary to community acquired pneumonia.     PT Comments    Patient is making progress toward PT goals and current plan remains appropriate.    Follow Up Recommendations  SNF     Equipment Recommendations  Rolling walker with 5" wheels    Recommendations for Other Services       Precautions / Restrictions Precautions Precautions: Fall    Mobility  Bed Mobility Overal bed mobility: Modified Independent                Transfers Overall transfer level: Needs assistance Equipment used: Rolling walker (2 wheeled) Transfers: Sit to/from Stand Sit to Stand: Supervision            Ambulation/Gait Ambulation/Gait assistance: Min guard Gait Distance (Feet): 250 Feet Assistive device: Rolling walker (2 wheeled) Gait Pattern/deviations: Step-through pattern;Decreased stride length;Decreased dorsiflexion - right;Decreased dorsiflexion - left(R drop foot > L) Gait velocity: decreased but is able to improve with cues   General Gait Details: min guard for safety; unsteadiness noted especially with turning and horizontal head turns but no overt LOB; steggage gait especially with R LE; cues for upright posture and increased gait speed which pt is able to demonstrate   Stairs             Wheelchair Mobility    Modified Rankin (Stroke Patients Only)       Balance Overall balance assessment: Needs assistance Sitting-balance support: Feet supported Sitting balance-Leahy Scale: Good     Standing balance support: Bilateral upper extremity supported;During functional activity Standing balance-Leahy Scale: Fair                              Cognition Arousal/Alertness: Awake/alert Behavior During Therapy: WFL for tasks assessed/performed Overall Cognitive Status: No family/caregiver present to determine baseline cognitive functioning Area of Impairment: Safety/judgement                         Safety/Judgement: Decreased awareness of safety;Decreased awareness of deficits            Exercises      General Comments        Pertinent Vitals/Pain Pain Assessment: Faces Faces Pain Scale: No hurt    Home Living                      Prior Function            PT Goals (current goals can now be found in the care plan section) Progress towards PT goals: Progressing toward goals    Frequency    Min 2X/week      PT Plan Current plan remains appropriate    Co-evaluation              AM-PAC PT "6 Clicks" Mobility   Outcome Measure  Help needed turning from your back to your side while in a flat bed without using bedrails?: None Help needed moving from lying on your back to sitting on the side of a flat bed without using bedrails?: None Help needed moving to and from  a bed to a chair (including a wheelchair)?: None Help needed standing up from a chair using your arms (e.g., wheelchair or bedside chair)?: None Help needed to walk in hospital room?: A Little Help needed climbing 3-5 steps with a railing? : A Lot 6 Click Score: 21    End of Session Equipment Utilized During Treatment: Gait belt Activity Tolerance: Patient tolerated treatment well Patient left: in bed;with call bell/phone within reach;with bed alarm set;with nursing/sitter in room Nurse Communication: Mobility status PT Visit Diagnosis: Unsteadiness on feet (R26.81);Muscle weakness (generalized) (M62.81);Difficulty in walking, not elsewhere classified (R26.2)     Time: 5726-2035 PT Time Calculation (min) (ACUTE ONLY): 20 min  Charges:  $Gait Training: 8-22 mins                      Earney Navy, PTA Acute Rehabilitation Services Pager: [phone] Office: [phone]     Darliss Cheney 04/06/2019, 12:21 PM

## 2019-04-06 NOTE — Progress Notes (Signed)
Notified pharmacy about 600mg  bedtime gabapentin at 2200 and 300mg  gabapentin scheduled at 2200.  Pharmacy stated to give 300mg  gabapentin now and give 600mg  at bedtime.

## 2019-04-06 NOTE — TOC Progression Note (Signed)
Transition of Care [facility]) - Progression Note    Patient Details  Name: [name] MRN: [id number] Date of Birth: 14-Oct-1955  [facility] CM/SW [place], [place] Phone Number: 04/06/2019, 10:00 AM  Clinical Narrative:     [name] has denied patient. Patient currently doesn't have any other bed offers.   CSW will continue to follow and assist.   Expected Discharge Plan: Skilled Nursing Facility Barriers to Discharge: Homeless with medical needs, SNF Pending bed offer  Expected Discharge Plan and Services Expected Discharge Plan: [place] arrangements for the past 2 months: Hotel/Motel([employer] [facility])                                       Social Determinants of Health (SDOH) Interventions    Readmission Risk Interventions Readmission Risk Prevention Plan 08/12/2018  Transportation Screening Complete  Medication Review Press photographer) Complete  PCP or Specialist appointment within 3-5 days of discharge Complete  HRI or [place] Complete  SW Recovery Care/Counseling Consult Complete  [place] Not Applicable  Some recent data might be hidden

## 2019-04-06 NOTE — Progress Notes (Signed)
PROGRESS NOTE  [name] VCB:[id number] DOB: [birth year]   PCP: Patient, No Pcp Per  Patient is from: Homeless  DOA: 03/30/2019 LOS: 6  Brief Narrative / Interim history: 63 year old homeless male with history of IDDM-2, HTN, chronic hepatitis C, polysubstance abuse, and chronic pain presented with generalized weakness, nausea and poor p.o. intake for 3 days and admitted for sepsis due to CAP and right acute otitis media.  Completed course of ceftriaxone and azithromycin.  Subjective: No major events overnight of this morning.  Reports difficulty sleeping.  Continues to complain generalized pain mainly in his legs.  Also reports right ear pain.  Denies chest pain, dyspnea, GI or UTI symptoms.   Objective: Vitals:   04/06/19 0326 04/06/19 0425 04/06/19 0909 04/06/19 1336  BP:  (!) 166/72 137/62 134/64  Pulse:  73 77 73  Resp:  16 18 19   Temp:    97.7 F (36.5 C)  TempSrc:    Oral  SpO2:  98% 99% 98%  Weight: 71.5 kg     Height:        Intake/Output Summary (Last 24 hours) at 04/06/2019 1348 Last data filed at 04/06/2019 0900 Gross per 24 hour  Intake 620 ml  Output --  Net 620 ml   Filed Weights   04/04/19 0612 04/05/19 0438 04/06/19 0326  Weight: 69.9 kg 69.9 kg 71.5 kg    Examination:  GENERAL: No acute distress.  Appears well.  HEENT: MMM.  Vision and hearing grossly intact.  NECK: Supple.  No apparent JVD.  RESP:  No IWOB. Good air movement bilaterally. CVS:  RRR. Heart sounds normal.  ABD/GI/GU: Bowel sounds present. Soft. Non tender.  MSK/EXT:  Moves extremities. No apparent deformity or edema.  SKIN: Diffuse vitiligo. NEURO: Awake, alert and oriented appropriately.  No apparent focal neuro deficit. PSYCH: Calm. Normal affect.  Procedures:  None  Assessment & Plan: Sepsis due to CAP: Sepsis resolved.  COVID-19 and RVP negative.  No respiratory distress. -Completed ceftriaxone and azithromycin for CAP 12/16-12/21.  Acute otitis media of right  ear -Antibiotics as above.  Uncontrolled type I diabetes with hyperglycemia and diabetic neuropathy Recent Labs    04/05/19 2117 04/06/19 0649 04/06/19 1109  GLUCAP 170* 268* 113*  -Continue Lantus 25 units daily -NovoLog 4 units AC, SSI-moderate -Start a statin -Gabapentin 300 mg 3 times daily, with additional 600 mg at bedtime  Essential hypertension: BP elevated. -Continue amlodipine 5 mg daily and losartan  Hyponatremia: Could be due to losartan. -Recheck in the morning  Iron deficiency anemia: Hgb 11-13 (baseline)> 10.2 (admit)>> 9.4.  Anemia panel consistent with IDA. -IV Feraheme on 12/22 -Ferrous sulfate twice daily with good bowel regimen  History of chronic hepatitis C: HCV RNA undetectable in 12/2016 and 04/2017.  Tobacco use disorder: Reports smoking about a pack a day. -Encourage cessation -Nicotine patch  Alcohol abuse in remission -Congratulated.  AKI: Resolved.  Homelessness -TOC team on board.                 DVT prophylaxis: Subcu Lovenox Code Status: Full code full code Family Communication: Patient and/or RN.  Disposition Plan: Final disposition SNF. Consultants: None   Microbiology summarized: RVP negative. COVID-19 negative.  Sch Meds:  Scheduled Meds: . amLODipine  5 mg Oral Daily  . atorvastatin  40 mg Oral q1800  . enoxaparin (LOVENOX) injection  40 mg Subcutaneous Q24H  . ferrous sulfate  325 mg Oral BID WC  . gabapentin  300 mg Oral 4  times per day  . guaiFENesin  10 mL Oral Q6H  . insulin aspart  0-15 Units Subcutaneous TID WC  . insulin aspart  0-5 Units Subcutaneous QHS  . insulin aspart  4 Units Subcutaneous TID WC  . insulin glargine  25 Units Subcutaneous Daily  . LORazepam  1 mg Intravenous Once  . losartan  25 mg Oral Daily  . neomycin-polymyxin-hydrocortisone  4 drop Right EAR Q8H  . nicotine  21 mg Transdermal Daily   Continuous Infusions: . sodium chloride Stopped (04/02/19 1000)   PRN Meds:.sodium  chloride, acetaminophen, HYDROcodone-acetaminophen, senna-docusate  Antimicrobials: Anti-infectives (From admission, onward)   Start     Dose/Rate Route Frequency Ordered Stop   04/01/19 0900  azithromycin (ZITHROMAX) 500 mg in sodium chloride 0.9 % 250 mL IVPB     500 mg 250 mL/hr over 60 Minutes Intravenous Every 24 hours 03/31/19 0822 04/05/19 0810   04/01/19 0800  cefTRIAXone (ROCEPHIN) 2 g in sodium chloride 0.9 % 100 mL IVPB     2 g 200 mL/hr over 30 Minutes Intravenous Every 24 hours 03/31/19 0822 04/05/19 0811   03/31/19 0615  cefTRIAXone (ROCEPHIN) 1 g in sodium chloride 0.9 % 100 mL IVPB     1 g 200 mL/hr over 30 Minutes Intravenous  Once 03/31/19 0610 03/31/19 0935   03/31/19 0615  azithromycin (ZITHROMAX) 500 mg in sodium chloride 0.9 % 250 mL IVPB     500 mg 250 mL/hr over 60 Minutes Intravenous  Once 03/31/19 0610 03/31/19 1211   03/31/19 0245  doxycycline (VIBRA-TABS) tablet 100 mg     100 mg Oral  Once 03/31/19 0232 03/31/19 0408       I have personally reviewed the following labs and images: CBC: Recent Labs  Lab 04/01/19 0451 04/02/19 0416 04/03/19 0337 04/06/19 0445  WBC 8.2 8.3 7.1 7.7  NEUTROABS 5.0  --   --   --   HGB 10.2* 9.4* 9.4* 9.7*  HCT 31.2* 29.2* 29.4* 29.4*  MCV 81.9 81.3 81.9 80.8  PLT 406* 402* 443* 416*   BMP &GFR Recent Labs  Lab 04/01/19 0451 04/02/19 0416 04/03/19 0337 04/06/19 0445  NA 132* 135 135 131*  K 4.2 4.4 4.9 5.0  CL 97* 102 103 101  CO2 26 25 24  21*  GLUCOSE 334* 270* 324* 332*  BUN 26* 21 23 30*  CREATININE 1.61* 1.16 1.26* 1.16  CALCIUM 8.3* 8.3* 8.3* 8.5*  MG 1.8 1.7 1.8 1.9  PHOS 2.3*  --   --   --    Estimated Creatinine Clearance: 66.8 mL/min (by C-G formula based on SCr of 1.16 mg/dL). Liver & Pancreas: No results for input(s): AST, ALT, ALKPHOS, BILITOT, PROT, ALBUMIN in the last 168 hours. No results for input(s): LIPASE, AMYLASE in the last 168 hours. No results for input(s): AMMONIA in the last 168  hours. Diabetic: No results for input(s): HGBA1C in the last 72 hours. Recent Labs  Lab 04/05/19 1200 04/05/19 1603 04/05/19 2117 04/06/19 0649 04/06/19 1109  GLUCAP 263* 164* 170* 268* 113*   Cardiac Enzymes: No results for input(s): CKTOTAL, CKMB, CKMBINDEX, TROPONINI in the last 168 hours. No results for input(s): PROBNP in the last 8760 hours. Coagulation Profile: No results for input(s): INR, PROTIME in the last 168 hours. Thyroid Function Tests: No results for input(s): TSH, T4TOTAL, FREET4, T3FREE, THYROIDAB in the last 72 hours. Lipid Profile: No results for input(s): CHOL, HDL, LDLCALC, TRIG, CHOLHDL, LDLDIRECT in the last 72 hours. Anemia Panel:  Recent Labs    04/05/19 1223  VITAMINB12 407  FOLATE 8.4  FERRITIN 36  TIBC 315  IRON 20*  RETICCTPCT 0.8   Urine analysis:    Component Value Date/Time   COLORURINE YELLOW 03/31/2019 0310   APPEARANCEUR CLEAR 03/31/2019 0310   LABSPEC 1.020 03/31/2019 0310   PHURINE 6.0 03/31/2019 0310   GLUCOSEU >=500 (A) 03/31/2019 0310   GLUCOSEU 500 04/28/2012 1149   HGBUR NEGATIVE 03/31/2019 0310   BILIRUBINUR NEGATIVE 03/31/2019 0310   BILIRUBINUR neg 04/28/2012 1053   KETONESUR NEGATIVE 03/31/2019 0310   PROTEINUR 100 (A) 03/31/2019 0310   UROBILINOGEN 4.0 (H) 11/16/2013 1646   NITRITE NEGATIVE 03/31/2019 0310   LEUKOCYTESUR NEGATIVE 03/31/2019 0310   Sepsis Labs: Invalid input(s): PROCALCITONIN, [place]  Microbiology: Recent Results (from the past 240 hour(s))  SARS CORONAVIRUS 2 (TAT 6-24 HRS) Nasopharyngeal Nasopharyngeal Swab     Status: None   Collection Time: 03/31/19  6:45 AM   Specimen: Nasopharyngeal Swab  Result Value Ref Range Status   SARS Coronavirus 2 NEGATIVE NEGATIVE Final    Comment: (NOTE) SARS-CoV-2 target nucleic acids are NOT DETECTED. The SARS-CoV-2 RNA is generally detectable in upper and lower respiratory specimens during the acute phase of infection. Negative results do not preclude  SARS-CoV-2 infection, do not rule out co-infections with other pathogens, and should not be used as the sole basis for treatment or other patient management decisions. Negative results must be combined with clinical observations, patient history, and epidemiological information. The expected result is Negative. Fact Sheet for Patients: SugarRoll.be Fact Sheet for Healthcare Providers: https://www.woods-mathews.com/ This test is not yet approved or cleared by the [place] FDA and  has been authorized for detection and/or diagnosis of SARS-CoV-2 by FDA under an Emergency Use Authorization (EUA). This EUA will remain  in effect (meaning this test can be used) for the duration of the COVID-19 declaration under Section 56 4(b)(1) of the Act, 21 U.S.C. section 360bbb-3(b)(1), unless the authorization is terminated or revoked sooner. Performed at [facility], [place] [address], [place], [place] [address]   Respiratory Panel by PCR     Status: None   Collection Time: 03/31/19  9:44 AM   Specimen: Nasopharyngeal Swab; Respiratory  Result Value Ref Range Status   Adenovirus NOT DETECTED NOT DETECTED Final   Coronavirus 229E NOT DETECTED NOT DETECTED Final    Comment: (NOTE) The Coronavirus on the Respiratory Panel, DOES NOT test for the novel  Coronavirus (2019 nCoV)    Coronavirus HKU1 NOT DETECTED NOT DETECTED Final   Coronavirus NL63 NOT DETECTED NOT DETECTED Final   Coronavirus OC43 NOT DETECTED NOT DETECTED Final   Metapneumovirus NOT DETECTED NOT DETECTED Final   Rhinovirus / Enterovirus NOT DETECTED NOT DETECTED Final   Influenza A NOT DETECTED NOT DETECTED Final   Influenza B NOT DETECTED NOT DETECTED Final   Parainfluenza Virus 1 NOT DETECTED NOT DETECTED Final   Parainfluenza Virus 2 NOT DETECTED NOT DETECTED Final   Parainfluenza Virus 3 NOT DETECTED NOT DETECTED Final   Parainfluenza Virus 4 NOT DETECTED NOT DETECTED Final    Respiratory Syncytial Virus NOT DETECTED NOT DETECTED Final   Bordetella pertussis NOT DETECTED NOT DETECTED Final   Chlamydophila pneumoniae NOT DETECTED NOT DETECTED Final   Mycoplasma pneumoniae NOT DETECTED NOT DETECTED Final    Comment: Performed at [facility], [place]. [address], [place], [place] [address]    Radiology Studies: No results found.    Isabellarose Kope T. Keegen Heffern  [facility]  If 7PM-7AM, please contact night-coverage [email or web address] Password [email or web address] 04/06/2019, 1:48 PM

## 2019-04-07 DIAGNOSIS — I10 Essential (primary) hypertension: Secondary | ICD-10-CM

## 2019-04-07 DIAGNOSIS — N179 Acute kidney failure, unspecified: Secondary | ICD-10-CM

## 2019-04-07 DIAGNOSIS — D500 Iron deficiency anemia secondary to blood loss (chronic): Secondary | ICD-10-CM

## 2019-04-07 LAB — BASIC METABOLIC PANEL
Anion gap: 7 (ref 5–15)
BUN: 25 mg/dL — ABNORMAL HIGH (ref 8–23)
CO2: 25 mmol/L (ref 22–32)
Calcium: 8.8 mg/dL — ABNORMAL LOW (ref 8.9–10.3)
Chloride: 107 mmol/L (ref 98–111)
Creatinine, Ser: 1.51 mg/dL — ABNORMAL HIGH (ref 0.61–1.24)
GFR calc Af Amer: 57 mL/min — ABNORMAL LOW (ref >60)
GFR calc non Af Amer: 49 mL/min — ABNORMAL LOW (ref >60)
Glucose, Bld: 212 mg/dL — ABNORMAL HIGH (ref 70–99)
Potassium: 4.7 mmol/L (ref 3.5–5.1)
Sodium: 139 mmol/L (ref 135–145)

## 2019-04-07 LAB — HEMOGLOBIN AND HEMATOCRIT, BLOOD
HCT: 29.6 % — ABNORMAL LOW (ref 39.0–52.0)
Hemoglobin: 9.2 g/dL — ABNORMAL LOW (ref 13.0–17.0)

## 2019-04-07 LAB — GLUCOSE, CAPILLARY
Glucose-Capillary: 177 mg/dL — ABNORMAL HIGH (ref 70–99)
Glucose-Capillary: 73 mg/dL (ref 70–99)
Glucose-Capillary: 202 mg/dL — ABNORMAL HIGH (ref 70–99)
Glucose-Capillary: 179 mg/dL — ABNORMAL HIGH (ref 70–99)

## 2019-04-07 LAB — SARS CORONAVIRUS 2 (TAT 6-24 HRS): SARS Coronavirus 2: NEGATIVE

## 2019-04-07 LAB — MAGNESIUM: Magnesium: 2.1 mg/dL (ref 1.7–2.4)

## 2019-04-07 MED ORDER — SENNOSIDES-DOCUSATE SODIUM 8.6-50 MG PO TABS: 1.0000 | ORAL_TABLET | Freq: Two times a day (BID) | ORAL | Status: DC | PRN

## 2019-04-07 MED ORDER — NICOTINE 21 MG/24HR TD PT24: 21.0000 mg | MEDICATED_PATCH | Freq: Every day | TRANSDERMAL | 0 refills | Status: AC

## 2019-04-07 MED ORDER — AMLODIPINE BESYLATE 10 MG PO TABS: 10.0000 mg | ORAL_TABLET | Freq: Every day | ORAL | Status: DC

## 2019-04-07 MED ORDER — ACETAMINOPHEN 325 MG PO TABS: 650.0000 mg | ORAL_TABLET | Freq: Four times a day (QID) | ORAL | Status: AC | PRN

## 2019-04-07 MED ORDER — ATORVASTATIN CALCIUM 40 MG PO TABS: 40.0000 mg | ORAL_TABLET | Freq: Every day | ORAL | Status: DC

## 2019-04-07 MED ORDER — GABAPENTIN 300 MG PO CAPS: ORAL_CAPSULE | ORAL | Status: DC

## 2019-04-07 MED ORDER — INSULIN ASPART 100 UNIT/ML ~~LOC~~ SOLN: 4.0000 [IU] | Freq: Three times a day (TID) | SUBCUTANEOUS | 11 refills | Status: DC

## 2019-04-07 MED ORDER — INSULIN GLARGINE 100 UNIT/ML ~~LOC~~ SOLN: 25.0000 [IU] | Freq: Every day | SUBCUTANEOUS | 11 refills | Status: DC

## 2019-04-07 MED ORDER — AMLODIPINE BESYLATE 5 MG PO TABS: 5.0000 mg | ORAL_TABLET | Freq: Every day | ORAL | Status: DC

## 2019-04-07 MED ORDER — INSULIN ASPART 100 UNIT/ML ~~LOC~~ SOLN: SUBCUTANEOUS | 11 refills | Status: DC

## 2019-04-07 NOTE — Discharge Summary (Signed)
Physician Discharge Summary  TABB CROGHAN XHB:[id number] DOB: 08-Sep-1955 DOA: 03/30/2019  PCP: Patient, No Pcp Per  Admit date: 03/30/2019 Discharge date: 04/07/2019  Admitted From: Homeless Disposition: SNF  Recommendations for Outpatient Follow-up:  1. Follow up with PCP in 1 week 2. Please obtain CBC/BMP/Mag in 1 week 3. Please follow up on the following pending results: None  Home Health: Not applicable Equipment/Devices: Not applicable  Discharge Condition: Stable CODE STATUS: Full code   Hospital Course: 63 year old homeless male with history of IDDM-2, HTN, chronic hepatitis C, polysubstance abuse, and chronic pain presented with generalized weakness, nausea and poor p.o. intake for 3 days and admitted for sepsis due to CAP and right acute otitis media.  Completed course of ceftriaxone and azithromycin with resolution of his symptoms other than generalized weakness. Patient was evaluated by PT/OT who recommended SNF placement.   Discharge to SNF in stable condition.  See individual problem list below for more hospital course.  Discharge Diagnoses:  Sepsis due to CAP: Sepsis resolved.  COVID-19 and RVP negative.  No respiratory distress. -Completed ceftriaxone and azithromycin for CAP 12/16-12/21.  Acute otitis media of right ear -Antibiotics as above.  Uncontrolled type I diabetes with hyperglycemia and diabetic neuropathy Recent Labs    04/06/19 2111 04/07/19 0631 04/07/19 1135  GLUCAP 292* 177* 73  -Continue Lantus 25 units daily, NovoLog 4 units AC,  SSI-moderate and statin -Gabapentin 300 mg twice daily and 900 mg nightly -Discontinued losartan due to AKI.  Essential hypertension: BP elevated. -Increase amlodipine to 10 mg daily. -Discontinued losartan in the setting of AKI.  Hyponatremia: Resolved  Iron deficiency anemia: Hgb 11-13 (baseline)> 10.2 (admit)>> 9.4.  Anemia panel consistent with IDA. Received IV Feraheme on 12/22 -Ferrous sulfate  twice daily with good bowel regimen  History of chronic hepatitis C: HCV RNA undetectable in 12/2016 and 04/2017.  Tobacco use disorder: Reports smoking about a pack a day. -Encouraged cessation -Nicotine patch  Alcohol abuse in remission -Congratulated.  AKI: Creatinine up from 1.16-1.51 likely due to initiation of losartan. -Discontinued losartan. -Recheck BMP at follow-up.  Homelessness -Discharged to SNF.   Discharge Instructions   Allergies as of 04/07/2019      Reactions   Codeine Itching, Hives   Tolerates hydrocodone/apap      Medication List    STOP taking these medications   insulin aspart protamine- aspart (70-30) 100 UNIT/ML injection Commonly known as: NOVOLOG MIX 70/30     TAKE these medications   Accu-Chek Aviva Plus w/Device Kit 1 each by Does not apply route 2 (two) times a day. Use to monitor glucose levels BID; E10.29   Accu-Chek Aviva Soln 1 each by In Vitro route as needed. Use to calibrate meter prn   Accu-Chek Softclix Lancets lancets Use to monitor glucose levels BID; E10.29   acetaminophen 325 MG tablet Commonly known as: TYLENOL Take 2 tablets (650 mg total) by mouth every 6 (six) hours as needed for mild pain or headache.   amLODipine 10 MG tablet Commonly known as: NORVASC Take 1 tablet (10 mg total) by mouth daily. Start taking on: April 08, 2019   atorvastatin 40 MG tablet Commonly known as: LIPITOR Take 1 tablet (40 mg total) by mouth daily at 6 PM.   blood glucose meter kit and supplies Kit Dispense based on patient and insurance preference. Use up to four times daily as directed. (FOR ICD-9 250.00, 250.01).   gabapentin 300 MG capsule Commonly known as: NEURONTIN Take 1 capsule (  300 mg total) by mouth 2 (two) times daily at 10 am and 4 pm AND 3 capsules (900 mg total) at bedtime.   glucose blood test strip Commonly known as: Accu-Chek Aviva Plus Use to monitor glucose levels BID; E10.29   insulin aspart 100  UNIT/ML injection Commonly known as: novoLOG Glucose < 70: implement hypoglycemia protocol Glucose 70 - 120: 0 units Glucose 121 - 150: 2 units Glucose 151 - 200: 3 units Glucose 201 - 250: 5 units Glucose 251 - 300: 8 units Glucose 301 - 350: 11 units Glucose 351 - 400: 15 units and call your doctor or go to emergency department  5.  Correction coverage at bedtime (9pm) Glucose < 70: implement hypoglycemia protocol Glucose  70 - 120: 0 units Glucose 121 - 150: 0 units Glucose 151 - 200: 0 units Glucose 201 - 250: 2 units Glucose 251 - 300: 3 units Glucose 301 - 350: 4 units and call you doctor or go to emergency department   insulin aspart 100 UNIT/ML injection Commonly known as: novoLOG Inject 4 Units into the skin 3 (three) times daily with meals. If he eats greater than 50% of his meals   insulin glargine 100 UNIT/ML injection Commonly known as: LANTUS Inject 0.25 mLs (25 Units total) into the skin daily. What changed: how much to take   Insulin Pen Needle 31G X 5 MM Misc Use with novolog flex pen   nicotine 21 mg/24hr patch Commonly known as: NICODERM CQ - dosed in mg/24 hours Place 1 patch (21 mg total) onto the skin daily. Start taking on: April 08, 2019   senna-docusate 8.6-50 MG tablet Commonly known as: Senokot-S Take 1 tablet by mouth 2 (two) times daily as needed for moderate constipation.       Consultations:  None  Procedures/Studies:  None   DG Chest 1 View  Result Date: 03/30/2019 CLINICAL DATA:  Generalized weakness. EXAM: CHEST  1 VIEW COMPARISON:  Portable chest 11/11/2018. FINDINGS: Mediastinum appears normal. Prominent ill-defined infiltrate noted over the right mid lung. Close follow-up chest x-rays to demonstrate clearing and to exclude underlying mass lesion suggested. No pleural effusion or pneumothorax. Heart size normal. Degenerative change thoracic spine. IMPRESSION: Prominent ill-defined infiltrate noted over the right mid lung.  Close follow-up chest x-rays to demonstrate clearing and to exclude underlying mass lesion suggested. Electronically Signed   By: Marcello Moores  Register   On: 03/30/2019 12:56   CT Chest Wo Contrast  Result Date: 03/31/2019 CLINICAL DATA:  Respiratory illness. Immunodeficiency. Cough. EXAM: CT CHEST WITHOUT CONTRAST TECHNIQUE: Multidetector CT imaging of the chest was performed following the standard protocol without IV contrast. Patient was injected with Omnipaque 300 IV, however contrast leaked from the tubing. Review of images demonstrated no definite intravascular contrast, and contrast seen within the soft tissues of the included left upper extremity consistent with extravasation. Approximately 65 cc Omnipaque 300 IV. CONTRAST EXTRAVASATION CONSULTATION: Type of contrast:  Omnipaque 300 Site of extravasation: Left antecubital fossa Estimated volume of extravasation: 65 ml Area of extravasation scanned with CT? Partially. Contrast also seen in external to the patient on the CT table. PATIENT'S SIGNS AND SYMPTOMS Skin blistering/ulceration: no Decrease capillary refill: no Change in skin color: no Decreased motor function or severe tightness: no Decreased pulses distal to site of extravasation: no Altered sensation: no Increasing pain or signs of increased swelling during observation: no TREATMENT Observation period at site: Emergency department patient returned to the emergency department. Limb elevation: Per protocol Ice packs  applied: Per protocol Heat pads applied: Per protocol Plastic surgery consulted? no DOCUMENTATION AND FOLLOW-UP Site contrast extravasation forms submitted? Per protocol. Post extravasation orders completed? Pending. Was additional follow up assigned to PA's? no Patient's questions answered? yes Patient instructed to call [phone] or seek immediate medical care if symptoms progress. COMPARISON:  Radiograph yesterday. Additional prior radiographs. No prior chest CT. FINDINGS:  Cardiovascular: Aortic atherosclerosis. No aneurysm. Heart is normal in size. Coronary artery calcifications versus stents. No pericardial effusion. Mediastinum/Nodes: Small mediastinal lymph nodes not enlarged by size criteria. Limited assessment for hilar adenopathy in the absence of IV contrast. Esophageal Brow thickening from the mid through distal esophagus. No visualized thyroid nodule. Lungs/Pleura: Rounded masslike opacity in the anterior right upper lobe abutting the fissure has central air bronchograms. This measures approximately 5.1 x 3.9 x 3.2 cm slight ground-glass opacity in the periphery with lobulated contours. No other focal airspace disease, allowing for limitations related to patient motion artifact, particularly in the lower lobes. Linear left lower lobe atelectasis. There is central bronchial thickening. Mild emphysema. No pulmonary edema. No pleural fluid. Trachea and mainstem bronchi are patent. Upper Abdomen: No acute findings. Cyst in the liver is partially included, better appreciated on prior abdominal CT. Vascular calcifications in the upper abdomen Musculoskeletal: There are no acute or suspicious osseous abnormalities. Mild degenerative change in the spine. IMPRESSION: 1. Rounded masslike opacity in the anterior right upper lobe measuring 5.1 x 3.9 x 3.2 cm with central air bronchograms. Findings may represent rounded pneumonia, however recommend close interval follow-up after course of treatment to ensure resolution. 2. Esophageal Deblanc thickening from the mid through distal esophagus, can be seen with reflux or esophagitis. 3. Coronary artery calcifications versus stents. Aortic Atherosclerosis (ICD10-I70.0) and Emphysema (ICD10-J43.9). Electronically Signed   By: Keith Rake M.D.   On: 03/31/2019 06:54      Discharge Exam: Vitals:   04/07/19 0422 04/07/19 1140  BP: (!) 139/59 (!) 141/64  Pulse: 76 74  Resp: 16 16  Temp: 98 F (36.7 C) 98.3 F (36.8 C)  SpO2: 97%  96%    GENERAL: No acute distress.  Appears well.  HEENT: MMM.  Vision and hearing grossly intact.  NECK: Supple.  No apparent JVD.  RESP:  No IWOB. Good air movement bilaterally. CVS:  RRR. Heart sounds normal.  ABD/GI/GU: Bowel sounds present. Soft. Non tender.  MSK/EXT:  Moves extremities. No apparent deformity or edema.  SKIN: Diffuse vitiligo NEURO: Awake, alert and oriented appropriately.  No apparent focal neuro deficit. PSYCH: Calm. Normal affect.   The results of significant diagnostics from this hospitalization (including imaging, microbiology, ancillary and laboratory) are listed below for reference.     Microbiology: Recent Results (from the past 240 hour(s))  SARS CORONAVIRUS 2 (TAT 6-24 HRS) Nasopharyngeal Nasopharyngeal Swab     Status: None   Collection Time: 03/31/19  6:45 AM   Specimen: Nasopharyngeal Swab  Result Value Ref Range Status   SARS Coronavirus 2 NEGATIVE NEGATIVE Final    Comment: (NOTE) SARS-CoV-2 target nucleic acids are NOT DETECTED. The SARS-CoV-2 RNA is generally detectable in upper and lower respiratory specimens during the acute phase of infection. Negative results do not preclude SARS-CoV-2 infection, do not rule out co-infections with other pathogens, and should not be used as the sole basis for treatment or other patient management decisions. Negative results must be combined with clinical observations, patient history, and epidemiological information. The expected result is Negative. Fact Sheet for Patients: SugarRoll.be Fact Sheet for  Healthcare Providers: https://www.woods-mathews.com/ This test is not yet approved or cleared by the [place] and  has been authorized for detection and/or diagnosis of SARS-CoV-2 by FDA under an Emergency Use Authorization (EUA). This EUA will remain  in effect (meaning this test can be used) for the duration of the COVID-19 declaration under Section 56  4(b)(1) of the Act, 21 U.S.C. section 360bbb-3(b)(1), unless the authorization is terminated or revoked sooner. Performed at [facility], [place] [address], [place], [place] [address]   Respiratory Panel by PCR     Status: None   Collection Time: 03/31/19  9:44 AM   Specimen: Nasopharyngeal Swab; Respiratory  Result Value Ref Range Status   Adenovirus NOT DETECTED NOT DETECTED Final   Coronavirus 229E NOT DETECTED NOT DETECTED Final    Comment: (NOTE) The Coronavirus on the Respiratory Panel, DOES NOT test for the novel  Coronavirus (2019 nCoV)    Coronavirus HKU1 NOT DETECTED NOT DETECTED Final   Coronavirus NL63 NOT DETECTED NOT DETECTED Final   Coronavirus OC43 NOT DETECTED NOT DETECTED Final   Metapneumovirus NOT DETECTED NOT DETECTED Final   Rhinovirus / Enterovirus NOT DETECTED NOT DETECTED Final   Influenza A NOT DETECTED NOT DETECTED Final   Influenza B NOT DETECTED NOT DETECTED Final   Parainfluenza Virus 1 NOT DETECTED NOT DETECTED Final   Parainfluenza Virus 2 NOT DETECTED NOT DETECTED Final   Parainfluenza Virus 3 NOT DETECTED NOT DETECTED Final   Parainfluenza Virus 4 NOT DETECTED NOT DETECTED Final   Respiratory Syncytial Virus NOT DETECTED NOT DETECTED Final   Bordetella pertussis NOT DETECTED NOT DETECTED Final   Chlamydophila pneumoniae NOT DETECTED NOT DETECTED Final   Mycoplasma pneumoniae NOT DETECTED NOT DETECTED Final    Comment: Performed at [facility], [place]. [address] [place], [place] [address]     Labs: BNP (last 3 results) No results for input(s): BNP in the last 8760 hours. Basic Metabolic Panel: Recent Labs  Lab 04/01/19 0451 04/02/19 0416 04/03/19 0337 04/06/19 0445 04/07/19 0357  NA 132* 135 135 131* 139  K 4.2 4.4 4.9 5.0 4.7  CL 97* 102 103 101 107  CO2 _0 21* 25  GLUCOSE 334* 270* 324* 332* 212*  BUN 26* 21 23 30* 25*  CREATININE 1.61* 1.16 1.26* 1.16 1.51*  CALCIUM 8.3* 8.3* 8.3* 8.5* 8.8*  MG 1.8 1.7 1.8 1.9 2.1   PHOS 2.3*  --   --   --   --    Liver Function Tests: No results for input(s): AST, ALT, ALKPHOS, BILITOT, PROT, ALBUMIN in the last 168 hours. No results for input(s): LIPASE, AMYLASE in the last 168 hours. No results for input(s): AMMONIA in the last 168 hours. CBC: Recent Labs  Lab 04/01/19 0451 04/02/19 0416 04/03/19 0337 04/06/19 0445 04/07/19 0357  WBC 8.2 8.3 7.1 7.7  --   NEUTROABS 5.0  --   --   --   --   HGB 10.2* 9.4* 9.4* 9.7* 9.2*  HCT 31.2* 29.2* 29.4* 29.4* 29.6*  MCV 81.9 81.3 81.9 80.8  --   PLT 406* 402* 443* 416*  --    Cardiac Enzymes: No results for input(s): CKTOTAL, CKMB, CKMBINDEX, TROPONINI in the last 168 hours. BNP: Invalid input(s): POCBNP CBG: Recent Labs  Lab 04/06/19 1109 04/06/19 1617 04/06/19 2111 04/07/19 0631 04/07/19 1135  GLUCAP 113* 119* 292* 177* 73   D-Dimer No results for input(s): DDIMER in the last 72 hours. Hgb  A1c No results for input(s): HGBA1C in the last 72 hours. Lipid Profile No results for input(s): CHOL, HDL, LDLCALC, TRIG, CHOLHDL, LDLDIRECT in the last 72 hours. Thyroid function studies No results for input(s): TSH, T4TOTAL, T3FREE, THYROIDAB in the last 72 hours.  Invalid input(s): FREET3 Anemia work up Recent Labs    04/05/19 1223  VITAMINB12 407  FOLATE 8.4  FERRITIN 36  TIBC 315  IRON 20*  RETICCTPCT 0.8   Urinalysis    Component Value Date/Time   COLORURINE YELLOW 03/31/2019 0310   APPEARANCEUR CLEAR 03/31/2019 0310   LABSPEC 1.020 03/31/2019 0310   PHURINE 6.0 03/31/2019 0310   GLUCOSEU >=500 (A) 03/31/2019 0310   GLUCOSEU 500 04/28/2012 1149   HGBUR NEGATIVE 03/31/2019 0310   BILIRUBINUR NEGATIVE 03/31/2019 0310   BILIRUBINUR neg 04/28/2012 1053   KETONESUR NEGATIVE 03/31/2019 0310   PROTEINUR 100 (A) 03/31/2019 0310   UROBILINOGEN 4.0 (H) 11/16/2013 1646   NITRITE NEGATIVE 03/31/2019 0310   LEUKOCYTESUR NEGATIVE 03/31/2019 0310   Sepsis Labs Invalid input(s): PROCALCITONIN,  WBC,   LACTICIDVEN   Time coordinating discharge: 35 minutes  SIGNED:  Mercy Riding, MD  [name] 04/07/2019, 1:29 PM  If 7PM-7AM, please contact night-coverage [email or web address] Password [email or web address]

## 2019-04-07 NOTE — TOC Progression Note (Addendum)
Transition of Care [facility]) - Progression Note    Patient Details  Name: TARIN NAVAREZ MRN: [id number] Date of Birth: [birth year]  Transition of Care [facility]) CM/SW [place], [place] Phone Number: [phone] 04/07/2019, 11:57 AM  Clinical Narrative:     CSW and RNCM spoke with patient via phone regarding dc plan, CSW informed him of bed offers from [facility].CSW has spoke to [place] at [facility] to confirm bed offer, agreeable to take pending updated COVID Test.  Patient is agreeable to dc plan and going to [facility], CSW informed him will need updated COVID swab done on him prior to dc. Patient expresses understanding. He continues to be concerned about belongings at [facility] and reports no support family or friends in place that could pick up and bring to hospital or SNF. CSW suggested patient call [employer] to see if belongings could be sent to SNF, patient reports he will follow up with hotel.   Pending updated COVID test results at this time.   Expected Discharge Plan: Skilled Nursing Facility Barriers to Discharge: Homeless with medical needs, SNF Pending bed offer  Expected Discharge Plan and Services Expected Discharge Plan: [place] arrangements for the past 2 months: Hotel/Motel([facility])                                       Social Determinants of Health (SDOH) Interventions    Readmission Risk Interventions Readmission Risk Prevention Plan 08/12/2018  Transportation Screening Complete  Medication Review Press photographer) Complete  PCP or Specialist appointment within 3-5 days of discharge Complete  HRI or [place] Complete  SW Recovery Care/Counseling Consult Complete  [place] Not Applicable  Some recent data might be hidden

## 2019-04-08 LAB — BASIC METABOLIC PANEL
Anion gap: 10 (ref 5–15)
BUN: 31 mg/dL — ABNORMAL HIGH (ref 8–23)
CO2: 25 mmol/L (ref 22–32)
Calcium: 8.8 mg/dL — ABNORMAL LOW (ref 8.9–10.3)
Chloride: 103 mmol/L (ref 98–111)
Creatinine, Ser: 1.38 mg/dL — ABNORMAL HIGH (ref 0.61–1.24)
GFR calc Af Amer: >60 mL/min (ref >60)
GFR calc non Af Amer: 54 mL/min — ABNORMAL LOW (ref >60)
Glucose, Bld: 210 mg/dL — ABNORMAL HIGH (ref 70–99)
Potassium: 4.7 mmol/L (ref 3.5–5.1)
Sodium: 138 mmol/L (ref 135–145)

## 2019-04-08 LAB — GLUCOSE, CAPILLARY
Glucose-Capillary: 249 mg/dL — ABNORMAL HIGH (ref 70–99)
Glucose-Capillary: 101 mg/dL — ABNORMAL HIGH (ref 70–99)
Glucose-Capillary: 94 mg/dL (ref 70–99)
Glucose-Capillary: 206 mg/dL — ABNORMAL HIGH (ref 70–99)

## 2019-04-08 LAB — HEMOGLOBIN AND HEMATOCRIT, BLOOD
HCT: 28.9 % — ABNORMAL LOW (ref 39.0–52.0)
Hemoglobin: 9.1 g/dL — ABNORMAL LOW (ref 13.0–17.0)

## 2019-04-08 NOTE — TOC Progression Note (Addendum)
Transition of Care [facility]) - Progression Note    Patient Details  Name: [name] MRN: [id number] Date of Birth: [birth year]  Transition of Care [facility]) CM/SW [place], [place] Phone Number: 04/08/2019, 10:15 AM  Clinical Narrative:    CSW contacted SNF liaison Harriet Pho to send pt to only offer which is [facility]. Liaison responded she was checking then responded there had been changes and she could not accept the pt. CSW inquired about what changes had occurred- they liaison states that they do not have any beds which was a change since they confirmed with floor CSW [place] on 12/23. CSW then inquired if they could take the pt tomorrow 12/24 if there was a bed. SNF now states they cannot take pt at all. Leadership has been alerted of this barrier and delay of discharge.  Without SNF offer from [facility] pt does not have any further offers at this time.   Expected Discharge Plan: Skilled Nursing Facility Barriers to Discharge: Homeless with medical needs, SNF Pending bed offer  Expected Discharge Plan and Services Expected Discharge Plan: [place] arrangements for the past 2 months: Hotel/Motel([facility])  Readmission Risk Interventions Readmission Risk Prevention Plan 08/12/2018  Transportation Screening Complete  Medication Review ([place]) Complete  PCP or Specialist appointment within 3-5 days of discharge Complete  HRI or [place] Complete  SW Recovery Care/Counseling Consult Complete  [place] Not Applicable  Some recent data might be hidden

## 2019-04-08 NOTE — Progress Notes (Signed)
Physical Therapy Treatment Patient Details Name: [name] MRN: [id number] DOB: 05-Mar-1956 Today's Date: 04/08/2019    History of Present Illness Pt is a 63 y/o male with PMH of DM, HTN, chronic hepatitis C, polysubstance abuse, CKD III, presents with generalized weakness, decreased PO intake, and nausea over the last 3 days. Found with sepsis secondary to community acquired pneumonia.     PT Comments    Patient seen for mobility progression. Continue to progress as tolerated with anticipated d/c to SNF for further skilled PT services.     Follow Up Recommendations  SNF     Equipment Recommendations  Rolling walker with 5" wheels    Recommendations for Other Services       Precautions / Restrictions Precautions Precautions: Fall Restrictions Weight Bearing Restrictions: No    Mobility  Bed Mobility Overal bed mobility: Independent                Transfers Overall transfer level: Needs assistance Equipment used: Rolling walker (2 wheeled) Transfers: Sit to/from Stand Sit to Stand: Supervision            Ambulation/Gait Ambulation/Gait assistance: Min guard;Min assist Gait Distance (Feet): 300 Feet Assistive device: Rolling walker (2 wheeled) Gait Pattern/deviations: Step-through pattern;Decreased stride length;Decreased dorsiflexion - right;Decreased dorsiflexion - left Gait velocity: decreased   General Gait Details: grossly min guard for safety; min A when turning as pt has decreased BOS; steppage gait given drop foot; cues for upright posture   Stairs             Wheelchair Mobility    Modified Rankin (Stroke Patients Only)       Balance Overall balance assessment: Needs assistance Sitting-balance support: Feet supported Sitting balance-Leahy Scale: Good     Standing balance support: Bilateral upper extremity supported;During functional activity Standing balance-Leahy Scale: Poor(for gait)                               Cognition Arousal/Alertness: Awake/alert Behavior During Therapy: WFL for tasks assessed/performed Overall Cognitive Status: No family/caregiver present to determine baseline cognitive functioning Area of Impairment: Safety/judgement;Memory                     Memory: Decreased short-term memory   Safety/Judgement: Decreased awareness of safety;Decreased awareness of deficits            Exercises      General Comments General comments (skin integrity, edema, etc.): VSS      Pertinent Vitals/Pain Pain Assessment: Faces Faces Pain Scale: Hurts a little bit Pain Location: B LES Pain Descriptors / Indicators: Discomfort Pain Intervention(s): Monitored during session;Repositioned    Home Living                      Prior Function            PT Goals (current goals can now be found in the care plan section) Progress towards PT goals: Progressing toward goals    Frequency    Min 2X/week      PT Plan Current plan remains appropriate    Co-evaluation              AM-PAC PT "6 Clicks" Mobility   Outcome Measure  Help needed turning from your back to your side while in a flat bed without using bedrails?: None Help needed moving from lying on your back to sitting on the side  of a flat bed without using bedrails?: None Help needed moving to and from a bed to a chair (including a wheelchair)?: None Help needed standing up from a chair using your arms (e.g., wheelchair or bedside chair)?: None Help needed to walk in hospital room?: A Little Help needed climbing 3-5 steps with a railing? : A Lot 6 Click Score: 21    End of Session Equipment Utilized During Treatment: Gait belt Activity Tolerance: Patient tolerated treatment well Patient left: in bed;with call bell/phone within reach Nurse Communication: Mobility status PT Visit Diagnosis: Unsteadiness on feet (R26.81);Muscle weakness (generalized) (M62.81);Difficulty in walking, not  elsewhere classified (R26.2)     Time: 0263-7858 PT Time Calculation (min) (ACUTE ONLY): 22 min  Charges:  $Gait Training: 8-22 mins                     Earney Navy, PTA Acute Rehabilitation Services Pager: [phone] Office: [phone]     Darliss Cheney 04/08/2019, 9:54 AM

## 2019-04-08 NOTE — Progress Notes (Signed)
PROGRESS NOTE    [name]  WNU:[id number] DOB: January 14, 1956 DOA: 03/30/2019 PCP: Patient, No Pcp Per  Brief Narrative: 63 year old homeless male with history of IDDM-2, HTN, chronic hepatitis C, polysubstance abuse, and chronic pain presented with generalized weakness, nausea and poor p.o. intake for 3 days and admitted for sepsis due to CAP and right acute otitis media.  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  COVID-19 and RVP negative.  No respiratory distress. -Completed ceftriaxone and azithromycin for CAP 12/16-12/21.  Acute otitis media of right ear -Antibiotics as above.  Uncontrolled type I diabetes with hyperglycemia and diabetic neuropathy-blood sugar 202, 179, 249, 101. Recent Labs (last 2 labs)        Recent Labs    04/05/19 0223 04/05/19 0611 04/05/19 1200  GLUCAP 305* 102* 263*    -Continue Lantus 25 units daily -NovoLog 4 units AC -Increase SSI to moderate with nightly coverage -Start a statin -Increase gabapentin 300 mg 3 times daily with additional 300 mg at bedtime  Essential hypertension: BP elevated.143/59 -Continue amlodipine 5 mg daily -Start low-dose losartan-would benefit given diabetes.  Normocytic anemia: Hgb 11-13 (baseline)> 10.2 (admit)>> 9.4 -Check anemia panel  History of chronic hepatitis C: HCV RNA undetectable in 12/2016 and 04/2017.  Tobacco use disorder: Reports smoking about a pack a day. -Encourage cessation -Nicotine patch  Alcohol abuse in remission -Congratulated.  Hyponatremia: Resolved.  AKI: Resolved.  Homelessness -TOC team on board.   Estimated body mass index is 21.7 kg/m as  calculated from the following:   Height as of this encounter: 6' (1.829 m).   Weight as of this encounter: 72.6 kg.  DVT prophylaxis: Subcu Lovenox Code Status: Full code full code Family Communication: Patient and/or RN. Available if any question.  Disposition Plan: Final disposition SNF. Consultants: None  Subjective: Patient is resting in bed anxious to be discharged  Objective: Vitals:   04/07/19 1140 04/07/19 1925 04/08/19 0100 04/08/19 0447  BP: (!) 141/64 (!) 127/53  (!) 143/59  Pulse: 74 81  76  Resp: 16 18  18   Temp: 98.3 F (36.8 C) 98.2 F (36.8 C)  98.1 F (36.7 C)  TempSrc: Oral Oral  Oral  SpO2: 96% 96%  96%  Weight:   72.6 kg   Height:        Intake/Output Summary (Last 24 hours) at 04/08/2019 1509 Last data filed at 04/08/2019 0505 Gross per 24 hour  Intake 840 ml  Output --  Net 840 ml   Filed Weights   04/06/19 0326 04/07/19 0417 04/08/19 0100  Weight: 71.5 kg 70.8 kg 72.6 kg    Examination:  General exam: Appears calm and comfortable  Respiratory system: Clear to auscultation. Respiratory effort normal. Cardiovascular system: S1 & S2 heard, RRR. No JVD, murmurs, rubs, gallops or clicks. No pedal edema. Gastrointestinal system: Abdomen is nondistended, soft and nontender. No organomegaly or masses felt. Normal bowel sounds heard. Central nervous system: Alert and oriented. No focal neurological deficits. Extremities: Symmetric 5 x 5 power. Skin: No rashes, lesions or ulcers Psychiatry: Judgement and insight appear normal. Mood & affect appropriate.     Data Reviewed: I  have personally reviewed following labs and imaging studies  CBC: Recent Labs  Lab 04/02/19 0416 04/03/19 0337 04/06/19 0445 04/07/19 0357 04/08/19 0444  WBC 8.3 7.1 7.7  --   --   HGB 9.4* 9.4* 9.7* 9.2* 9.1*  HCT 29.2* 29.4* 29.4* 29.6* 28.9*  MCV 81.3 81.9 80.8  --   --   PLT 402* 443* 416*  --   --    Basic Metabolic Panel: Recent Labs  Lab 04/02/19 0416  04/03/19 0337 04/06/19 0445 04/07/19 0357 04/08/19 0444  NA 135 135 131* 139 138  K 4.4 4.9 5.0 4.7 4.7  CL 102 103 101 107 103  CO2 25 24 21* 25 25  GLUCOSE 270* 324* 332* 212* 210*  BUN 21 23 30* 25* 31*  CREATININE 1.16 1.26* 1.16 1.51* 1.38*  CALCIUM 8.3* 8.3* 8.5* 8.8* 8.8*  MG 1.7 1.8 1.9 2.1  --    GFR: Estimated Creatinine Clearance: 57 mL/min (A) (by C-G formula based on SCr of 1.38 mg/dL (H)). Liver Function Tests: No results for input(s): AST, ALT, ALKPHOS, BILITOT, PROT, ALBUMIN in the last 168 hours. No results for input(s): LIPASE, AMYLASE in the last 168 hours. No results for input(s): AMMONIA in the last 168 hours. Coagulation Profile: No results for input(s): INR, PROTIME in the last 168 hours. Cardiac Enzymes: No results for input(s): CKTOTAL, CKMB, CKMBINDEX, TROPONINI in the last 168 hours. BNP (last 3 results) No results for input(s): PROBNP in the last 8760 hours. HbA1C: No results for input(s): HGBA1C in the last 72 hours. CBG: Recent Labs  Lab 04/07/19 1135 04/07/19 1549 04/07/19 2111 04/08/19 0613 04/08/19 1106  GLUCAP 73 202* 179* 249* 101*   Lipid Profile: No results for input(s): CHOL, HDL, LDLCALC, TRIG, CHOLHDL, LDLDIRECT in the last 72 hours. Thyroid Function Tests: No results for input(s): TSH, T4TOTAL, FREET4, T3FREE, THYROIDAB in the last 72 hours. Anemia Panel: No results for input(s): VITAMINB12, FOLATE, FERRITIN, TIBC, IRON, RETICCTPCT in the last 72 hours. Sepsis Labs: No results for input(s): PROCALCITON, LATICACIDVEN in the last 168 hours.  Recent Results (from the past 240 hour(s))  SARS CORONAVIRUS 2 (TAT 6-24 HRS) Nasopharyngeal Nasopharyngeal Swab     Status: None   Collection Time: 03/31/19  6:45 AM   Specimen: Nasopharyngeal Swab  Result Value Ref Range Status   SARS Coronavirus 2 NEGATIVE NEGATIVE Final    Comment: (NOTE) SARS-CoV-2 target nucleic acids are NOT DETECTED. The SARS-CoV-2 RNA is generally detectable  in upper and lower respiratory specimens during the acute phase of infection. Negative results do not preclude SARS-CoV-2 infection, do not rule out co-infections with other pathogens, and should not be used as the sole basis for treatment or other patient management decisions. Negative results must be combined with clinical observations, patient history, and epidemiological information. The expected result is Negative. Fact Sheet for Patients: SugarRoll.be Fact Sheet for Healthcare Providers: https://www.woods-.com/ This test is not yet approved or cleared by the [place] FDA and  has been authorized for detection and/or diagnosis of SARS-CoV-2 by FDA under an Emergency Use Authorization (EUA). This EUA will remain  in effect (meaning this test can be used) for the duration of the COVID-19 declaration under Section 56 4(b)(1) of the Act, 21 U.S.C. section 360bbb-3(b)(1), unless the authorization is terminated or revoked sooner. Performed at [facility], [place] [address] [place], [place] [address]   Respiratory Panel by PCR     Status: None   Collection Time: 03/31/19  9:44  AM   Specimen: Nasopharyngeal Swab; Respiratory  Result Value Ref Range Status   Adenovirus NOT DETECTED NOT DETECTED Final   Coronavirus 229E NOT DETECTED NOT DETECTED Final    Comment: (NOTE) The Coronavirus on the Respiratory Panel, DOES NOT test for the novel  Coronavirus (2019 nCoV)    Coronavirus HKU1 NOT DETECTED NOT DETECTED Final   Coronavirus NL63 NOT DETECTED NOT DETECTED Final   Coronavirus OC43 NOT DETECTED NOT DETECTED Final   Metapneumovirus NOT DETECTED NOT DETECTED Final   Rhinovirus / Enterovirus NOT DETECTED NOT DETECTED Final   Influenza A NOT DETECTED NOT DETECTED Final   Influenza B NOT DETECTED NOT DETECTED Final   Parainfluenza Virus 1 NOT DETECTED NOT DETECTED Final   Parainfluenza Virus 2 NOT DETECTED NOT DETECTED Final    Parainfluenza Virus 3 NOT DETECTED NOT DETECTED Final   Parainfluenza Virus 4 NOT DETECTED NOT DETECTED Final   Respiratory Syncytial Virus NOT DETECTED NOT DETECTED Final   Bordetella pertussis NOT DETECTED NOT DETECTED Final   Chlamydophila pneumoniae NOT DETECTED NOT DETECTED Final   Mycoplasma pneumoniae NOT DETECTED NOT DETECTED Final    Comment: Performed at [facility], [place] [address], [place], [place] [address]  SARS CORONAVIRUS 2 (TAT 6-24 HRS) Nasopharyngeal Nasopharyngeal Swab     Status: None   Collection Time: 04/07/19 11:45 AM   Specimen: Nasopharyngeal Swab  Result Value Ref Range Status   SARS Coronavirus 2 NEGATIVE NEGATIVE Final    Comment: (NOTE) SARS-CoV-2 target nucleic acids are NOT DETECTED. The SARS-CoV-2 RNA is generally detectable in upper and lower respiratory specimens during the acute phase of infection. Negative results do not preclude SARS-CoV-2 infection, do not rule out co-infections with other pathogens, and should not be used as the sole basis for treatment or other patient management decisions. Negative results must be combined with clinical observations, patient history, and epidemiological information. The expected result is Negative. Fact Sheet for Patients: SugarRoll.be Fact Sheet for Healthcare Providers: https://www.woods-[employer].com/ This test is not yet approved or cleared by the [place] FDA and  has been authorized for detection and/or diagnosis of SARS-CoV-2 by FDA under an Emergency Use Authorization (EUA). This EUA will remain  in effect (meaning this test can be used) for the duration of the COVID-19 declaration under Section 56 4(b)(1) of the Act, 21 U.S.C. section 360bbb-3(b)(1), unless the authorization is terminated or revoked sooner. Performed at [facility], [place] [address] [place], [place] [address]          Radiology Studies: No results  found.      Scheduled Meds: . amLODipine  5 mg Oral Daily  . atorvastatin  40 mg Oral q1800  . enoxaparin (LOVENOX) injection  40 mg Subcutaneous Q24H  . ferrous sulfate  325 mg Oral BID WC  . gabapentin  300 mg Oral 3 times per day  . gabapentin  600 mg Oral QHS  . guaiFENesin  10 mL Oral Q6H  . insulin aspart  0-15 Units Subcutaneous TID WC  . insulin aspart  0-5 Units Subcutaneous QHS  . insulin aspart  4 Units Subcutaneous TID WC  . insulin glargine  25 Units Subcutaneous Daily  . LORazepam  1 mg Intravenous Once  . neomycin-polymyxin-hydrocortisone  4 drop Right EAR Q8H  . nicotine  21 mg Transdermal Daily   Continuous Infusions: . sodium chloride Stopped (04/02/19 1000)     LOS: 8 days     Georgette Shell, MD [name]  If 7PM-7AM, please contact night-coverage [email or web address] Password [email or web address] 04/08/2019, 3:09 PM

## 2019-04-09 LAB — GLUCOSE, CAPILLARY
Glucose-Capillary: 98 mg/dL (ref 70–99)
Glucose-Capillary: 139 mg/dL — ABNORMAL HIGH (ref 70–99)
Glucose-Capillary: 74 mg/dL (ref 70–99)
Glucose-Capillary: 290 mg/dL — ABNORMAL HIGH (ref 70–99)

## 2019-04-09 NOTE — TOC Progression Note (Signed)
Transition of Care [facility]) - Progression Note    Patient Details  Name: HANSFORD HIRT MRN: [id number] Date of Birth: 11/09/1955  Transition of Care [facility]) CM/SW [place], [place] Phone Number: [phone] 04/09/2019, 9:01 AM  Clinical Narrative:     CSW informed of patient's cancellation of discharge yesterday due to [facility] SNF cancelling their bed offer.   CSW has attempted to follow up with SNFs today for any bed offers including [facility] and [place] however due to holiday no response at this time.   Expected Discharge Plan: Skilled Nursing Facility Barriers to Discharge: Homeless with medical needs, SNF Pending bed offer  Expected Discharge Plan and Services Expected Discharge Plan: [place] arrangements for the past 2 months: Hotel/Motel([facility] [employer] [facility])                                       Social Determinants of Health (SDOH) Interventions    Readmission Risk Interventions Readmission Risk Prevention Plan 08/12/2018  Transportation Screening Complete  Medication Review Press photographer) Complete  PCP or Specialist appointment within 3-5 days of discharge Complete  HRI or [place] Complete  SW Recovery Care/Counseling Consult Complete  [place] Not Applicable  Some recent data might be hidden

## 2019-04-09 NOTE — Progress Notes (Signed)
PROGRESS NOTE    RYZEN DEADY  ZSW:[id number] DOB: 06-May-1955 DOA: 03/30/2019 PCP: Patient, No Pcp Per   Brief Narrative: 63 year old homeless male with history of IDDM-2, HTN, chronic hepatitis C, polysubstance abuse, and chronic pain presented with generalized weakness, nausea and poor p.o. intake for 3 days and admitted for sepsis due to CAPandright acute otitis media. 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 COVID-19 and RVP negative. No respiratory distress. -Completed ceftriaxone and azithromycin for CAP 12/16-12/21.  Acute otitis media of right ear -Antibiotics as above.            Uncontrolled type 1 diabetes with neuropathy-on Lantus 25 units daily and NovoLog 4 units 3 times a day with SSI.  His blood sugar in the last 24 hours CBG (last 3)  Recent Labs    04/08/19 2128 04/09/19 0526 04/09/19 1113  GLUCAP 206* 98 139*   Continue gabapentin 300 mg 3 times a day with 300 mg at bedtime And continue statin   Essential hypertension: BP elevated.145/62 -Continue amlodipine 5 mg daily -Start low-dose losartan-would benefit given diabetes.  Normocytic anemia: Hgb 11-13 (baseline)>10.2 (admit)>>9.4.9.1  History of chronic hepatitis C: HCV RNA undetectable in 12/2016 and 04/2017.  Tobacco use disorder: Reports smoking about a pack a day. -Encourage cessation -Nicotine patch  Alcohol abuse in remission -Congratulated.  Hyponatremia: Resolved.  AKI: Resolved  Patient is homeless-await placement   Estimated body mass index is 21.75 kg/m as calculated from the following:   Height as of this encounter: 6' (1.829 m).   Weight as of  this encounter: 72.8 kg. DVT prophylaxis:Subcu Lovenox Code Status:Full code full code Family Communication:Patient and/or RN. Available if any question.  Disposition Plan:Final disposition SNF. Consultants:None   Subjective: He is resting in bed no concerns from the staff no new complaints  Objective: Vitals:   04/08/19 0447 04/08/19 2029 04/09/19 0418 04/09/19 1200  BP: (!) 143/59 (!) 115/59 (!) 151/58 (!) 145/62  Pulse: 76 75 76 77  Resp: 18 20 20 20   Temp: 98.1 F (36.7 C) 98 F (36.7 C) 97.6 F (36.4 C) 98.2 F (36.8 C)  TempSrc: Oral Oral Oral   SpO2: 96% 96% 96% 100%  Weight:   72.8 kg   Height:        Intake/Output Summary (Last 24 hours) at 04/09/2019 1226 Last data filed at 04/09/2019 0500 Gross per 24 hour  Intake 300 ml  Output --  Net 300 ml   Filed Weights   04/07/19 0417 04/08/19 0100 04/09/19 0418  Weight: 70.8 kg 72.6 kg 72.8 kg    Examination:  General exam: Appears calm and comfortable  Respiratory system: Clear to auscultation. Respiratory effort normal. Cardiovascular system: S1 & S2 heard, RRR. No JVD, murmurs, rubs, gallops or clicks. No pedal edema. Gastrointestinal system: Abdomen is nondistended, soft and nontender. No organomegaly or masses felt. Normal bowel sounds heard. Central nervous system: Alert and oriented. No focal neurological deficits. Extremities: Symmetric 5 x 5 power. Skin: No rashes, lesions or ulcers Psychiatry: Judgement and insight appear normal. Mood & affect appropriate.     Data Reviewed: I have personally reviewed following labs and imaging studies  CBC: Recent Labs  Lab 04/03/19 0337 04/06/19 0445 04/07/19 0357 04/08/19 0444  WBC 7.1 7.7  --   --   HGB 9.4* 9.7* 9.2* 9.1*  HCT 29.4* 29.4* 29.6* 28.9*  MCV 81.9 80.8  --   --   PLT 443* 416*  --   --    Basic Metabolic Panel: Recent Labs  Lab 04/03/19 0337 04/06/19 0445 04/07/19 0357 04/08/19 0444  NA 135 131* 139 138  K 4.9 5.0 4.7 4.7   CL 103 101 107 103  CO2 24 21* 25 25  GLUCOSE 324* 332* 212* 210*  BUN 23 30* 25* 31*  CREATININE 1.26* 1.16 1.51* 1.38*  CALCIUM 8.3* 8.5* 8.8* 8.8*  MG 1.8 1.9 2.1  --    GFR: Estimated Creatinine Clearance: 57.1 mL/min (A) (by C-G formula based on SCr of 1.38 mg/dL (H)). Liver Function Tests: No results for input(s): AST, ALT, ALKPHOS, BILITOT, PROT, ALBUMIN in the last 168 hours. No results for input(s): LIPASE, AMYLASE in the last 168 hours. No results for input(s): AMMONIA in the last 168 hours. Coagulation Profile: No results for input(s): INR, PROTIME in the last 168 hours. Cardiac Enzymes: No results for input(s): CKTOTAL, CKMB, CKMBINDEX, TROPONINI in the last 168 hours. BNP (last 3 results) No results for input(s): PROBNP in the last 8760 hours. HbA1C: No results for input(s): HGBA1C in the last 72 hours. CBG: Recent Labs  Lab 04/08/19 1106 04/08/19 1644 04/08/19 2128 04/09/19 0526 04/09/19 1113  GLUCAP 101* 94 206* 98 139*   Lipid Profile: No results for input(s): CHOL, HDL, LDLCALC, TRIG, CHOLHDL, LDLDIRECT in the last 72 hours. Thyroid Function Tests: No results for input(s): TSH, T4TOTAL, FREET4, T3FREE, THYROIDAB in the last 72 hours. Anemia Panel: No results for input(s): VITAMINB12, FOLATE, FERRITIN, TIBC, IRON, RETICCTPCT in the last 72 hours. Sepsis Labs: No results for input(s): PROCALCITON, LATICACIDVEN in the last 168 hours.  Recent Results (from the past 240 hour(s))  SARS CORONAVIRUS 2 (TAT 6-24 HRS) Nasopharyngeal Nasopharyngeal Swab     Status: None   Collection Time: 03/31/19  6:45 AM   Specimen: Nasopharyngeal Swab  Result Value Ref Range Status   SARS Coronavirus 2 NEGATIVE NEGATIVE Final    Comment: (NOTE) SARS-CoV-2 target nucleic acids are NOT DETECTED. The SARS-CoV-2 RNA is generally detectable in upper and lower respiratory specimens during the acute phase of infection. Negative results do not preclude SARS-CoV-2 infection, do  not rule out co-infections with other pathogens, and should not be used as the sole basis for treatment or other patient management decisions. Negative results must be combined with clinical observations, patient history, and epidemiological information. The expected result is Negative. Fact Sheet for Patients: SugarRoll.be Fact Sheet for Healthcare Providers: https://www.woods-.com/ This test is not yet approved or cleared by the [place] FDA and  has been authorized for detection and/or diagnosis of SARS-CoV-2 by FDA under an Emergency Use Authorization (EUA). This EUA will remain  in effect (meaning this test can be used) for the duration of the COVID-19 declaration under Section 56 4(b)(1) of the Act, 21 U.S.C. section 360bbb-3(b)(1), unless the authorization is terminated or revoked sooner. Performed at [facility], [place] [address] [place], [place] [address]   Respiratory Panel by PCR     Status: None   Collection Time: 03/31/19  9:44 AM   Specimen: Nasopharyngeal Swab; Respiratory  Result Value Ref Range Status   Adenovirus NOT DETECTED NOT DETECTED Final   Coronavirus 229E NOT DETECTED  NOT DETECTED Final    Comment: (NOTE) The Coronavirus on the Respiratory Panel, DOES NOT test for the novel  Coronavirus (2019 nCoV)    Coronavirus HKU1 NOT DETECTED NOT DETECTED Final   Coronavirus NL63 NOT DETECTED NOT DETECTED Final   Coronavirus OC43 NOT DETECTED NOT DETECTED Final   Metapneumovirus NOT DETECTED NOT DETECTED Final   Rhinovirus / Enterovirus NOT DETECTED NOT DETECTED Final   Influenza A NOT DETECTED NOT DETECTED Final   Influenza B NOT DETECTED NOT DETECTED Final   Parainfluenza Virus 1 NOT DETECTED NOT DETECTED Final   Parainfluenza Virus 2 NOT DETECTED NOT DETECTED Final   Parainfluenza Virus 3 NOT DETECTED NOT DETECTED Final   Parainfluenza Virus 4 NOT DETECTED NOT DETECTED Final   Respiratory Syncytial  Virus NOT DETECTED NOT DETECTED Final   Bordetella pertussis NOT DETECTED NOT DETECTED Final   Chlamydophila pneumoniae NOT DETECTED NOT DETECTED Final   Mycoplasma pneumoniae NOT DETECTED NOT DETECTED Final    Comment: Performed at [facility], [place] [address] [place], [place] [address]  SARS CORONAVIRUS 2 (TAT 6-24 HRS) Nasopharyngeal Nasopharyngeal Swab     Status: None   Collection Time: 04/07/19 11:45 AM   Specimen: Nasopharyngeal Swab  Result Value Ref Range Status   SARS Coronavirus 2 NEGATIVE NEGATIVE Final    Comment: (NOTE) SARS-CoV-2 target nucleic acids are NOT DETECTED. The SARS-CoV-2 RNA is generally detectable in upper and lower respiratory specimens during the acute phase of infection. Negative results do not preclude SARS-CoV-2 infection, do not rule out co-infections with other pathogens, and should not be used as the sole basis for treatment or other patient management decisions. Negative results must be combined with clinical observations, patient history, and epidemiological information. The expected result is Negative. Fact Sheet for Patients: SugarRoll.be Fact Sheet for Healthcare Providers: https://www.woods-[employer].com/ This test is not yet approved or cleared by the [place] FDA and  has been authorized for detection and/or diagnosis of SARS-CoV-2 by FDA under an Emergency Use Authorization (EUA). This EUA will remain  in effect (meaning this test can be used) for the duration of the COVID-19 declaration under Section 56 4(b)(1) of the Act, 21 U.S.C. section 360bbb-3(b)(1), unless the authorization is terminated or revoked sooner. Performed at [facility], [place] [address], [place], [place] [address]          Radiology Studies: No results found.      Scheduled Meds: . amLODipine  5 mg Oral Daily  . atorvastatin  40 mg Oral q1800  . enoxaparin (LOVENOX) injection  40 mg Subcutaneous  Q24H  . ferrous sulfate  325 mg Oral BID WC  . gabapentin  300 mg Oral 3 times per day  . gabapentin  600 mg Oral QHS  . guaiFENesin  10 mL Oral Q6H  . insulin aspart  0-15 Units Subcutaneous TID WC  . insulin aspart  0-5 Units Subcutaneous QHS  . insulin aspart  4 Units Subcutaneous TID WC  . insulin glargine  25 Units Subcutaneous Daily  . LORazepam  1 mg Intravenous Once  . neomycin-polymyxin-hydrocortisone  4 drop Right EAR Q8H  . nicotine  21 mg Transdermal Daily   Continuous Infusions: . sodium chloride Stopped (04/02/19 1000)     LOS: 9 days     Georgette Shell, MD [facility]  If 7PM-7AM, please contact night-coverage [email or web address] Password [email or web address] 04/09/2019, 12:26 PM

## 2019-04-10 LAB — GLUCOSE, CAPILLARY
Glucose-Capillary: 152 mg/dL — ABNORMAL HIGH (ref 70–99)
Glucose-Capillary: 121 mg/dL — ABNORMAL HIGH (ref 70–99)
Glucose-Capillary: 299 mg/dL — ABNORMAL HIGH (ref 70–99)
Glucose-Capillary: 231 mg/dL — ABNORMAL HIGH (ref 70–99)

## 2019-04-10 MED ORDER — LISINOPRIL 5 MG PO TABS
2.5000 mg | ORAL_TABLET | Freq: Every day | ORAL | Status: DC
  Administered 2019-04-10 – 2019-04-23 (×14): 2.5 mg via ORAL
  Filled 2019-04-10 (×14): qty 1

## 2019-04-10 NOTE — Progress Notes (Signed)
PROGRESS NOTE    [name]  WEX:[id number] DOB: [birth year] DOA: 03/30/2019 PCP: Patient, No Pcp Per    Brief Narrative:63 year old homeless male with history of IDDM-2, HTN, chronic hepatitis C, polysubstance abuse, and chronic pain presented with generalized weakness, nausea and poor p.o. intake for 3 days and admitted for sepsis due to CAPandright acute otitis media. 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 COVID-19 and RVP negative. No respiratory distress. -Completed ceftriaxone and azithromycin for CAP 12/16-12/21.  Acute otitis media of right ear -Antibiotics as above             Labile and uncontrolled type 2 diabetes-patient reports his blood sugar has been as high as 1500 and as low as 8!!!!  Blood sugar last 24 hours 98, 139, 74, 290, 152. I will continue current dose of Lantus and NovoLog.  Essential hypertension: BP 143/73.   -Continue amlodipine 5 mg daily Started low-dose lisinopril 2.5 mg daily given history of diabetes.   Normocytic anemia: Hgb 11-13 (baseline)>10.2 (admit)>>9.4.9.1  History of chronic hepatitis C: HCV RNA undetectable in 12/2016 and 04/2017.  Tobacco use disorder: Reports smoking about a pack a day. -Encourage cessation -Nicotine patch  Alcohol abuse in remission -Congratulated.  Hyponatremia: Resolved.  Hyperlipidemia continue statin.  Diabetes complicated with neuropathy on gabapentin.  AKI: Resolved  Patient is homeless-await placement  Estimated body mass index is 21.52 kg/m as calculated from the following:   Height as of this encounter: 6' (1.829 m).   Weight as of this encounter: 72  kg.  DVT prophylaxis:Subcu Lovenox Code Status:Full code Family Communication: None patient has 2 children who lives in [place] but they are not in touch with him. Disposition Plan:Final disposition SNF.  Trying to find bed placement. Consultants:None   Subjective: He is resting in bed very talkative today anxious to go to a rehab place before he can find his way out.  Objective: Vitals:   04/09/19 1200 04/09/19 2013 04/10/19 0500 04/10/19 0600  BP: (!) 145/62 127/60 (!) 182/67 (!) 143/74  Pulse: 77 73 75   Resp: 20 20 20    Temp: 98.2 F (36.8 C) 98.6 F (37 C) 98.2 F (36.8 C)   TempSrc:  Oral Oral   SpO2: 100% 96% 96%   Weight:   72 kg   Height:        Intake/Output Summary (Last 24 hours) at 04/10/2019 1127 Last data filed at 04/10/2019 0908 Gross per 24 hour  Intake 720 ml  Output --  Net 720 ml   Filed Weights   04/08/19 0100 04/09/19 0418 04/10/19 0500  Weight: 72.6 kg 72.8 kg 72 kg    Examination:  General exam: Appears calm and comfortable  Respiratory system: Clear to auscultation. Respiratory effort normal. Cardiovascular system: S1 & S2 heard, RRR. No JVD, murmurs, rubs, gallops or clicks. No pedal edema. Gastrointestinal system: Abdomen is nondistended, soft and nontender. No organomegaly or masses felt. Normal bowel sounds heard. Central nervous system: Alert and oriented. No focal neurological deficits. Extremities: Symmetric 5 x 5 power. Skin: No rashes, lesions or ulcers Psychiatry: Judgement and insight appear normal. Mood & affect appropriate.  Data Reviewed: I have personally reviewed following labs and imaging studies  CBC: Recent Labs  Lab 04/06/19 0445 04/07/19 0357 04/08/19 0444  WBC 7.7  --   --   HGB 9.7* 9.2* 9.1*  HCT 29.4* 29.6* 28.9*  MCV 80.8  --   --   PLT 416*  --   --    Basic Metabolic Panel: Recent Labs  Lab 04/06/19 0445 04/07/19 0357 04/08/19 0444  NA 131* 139 138  K 5.0 4.7 4.7  CL 101 107 103   CO2 21* 25 25  GLUCOSE 332* 212* 210*  BUN 30* 25* 31*  CREATININE 1.16 1.51* 1.38*  CALCIUM 8.5* 8.8* 8.8*  MG 1.9 2.1  --    GFR: Estimated Creatinine Clearance: 56.5 mL/min (A) (by C-G formula based on SCr of 1.38 mg/dL (H)). Liver Function Tests: No results for input(s): AST, ALT, ALKPHOS, BILITOT, PROT, ALBUMIN in the last 168 hours. No results for input(s): LIPASE, AMYLASE in the last 168 hours. No results for input(s): AMMONIA in the last 168 hours. Coagulation Profile: No results for input(s): INR, PROTIME in the last 168 hours. Cardiac Enzymes: No results for input(s): CKTOTAL, CKMB, CKMBINDEX, TROPONINI in the last 168 hours. BNP (last 3 results) No results for input(s): PROBNP in the last 8760 hours. HbA1C: No results for input(s): HGBA1C in the last 72 hours. CBG: Recent Labs  Lab 04/09/19 0526 04/09/19 1113 04/09/19 1601 04/09/19 2140 04/10/19 0605  GLUCAP 98 139* 74 290* 152*   Lipid Profile: No results for input(s): CHOL, HDL, LDLCALC, TRIG, CHOLHDL, LDLDIRECT in the last 72 hours. Thyroid Function Tests: No results for input(s): TSH, T4TOTAL, FREET4, T3FREE, THYROIDAB in the last 72 hours. Anemia Panel: No results for input(s): VITAMINB12, FOLATE, FERRITIN, TIBC, IRON, RETICCTPCT in the last 72 hours. Sepsis Labs: No results for input(s): PROCALCITON, LATICACIDVEN in the last 168 hours.  Recent Results (from the past 240 hour(s))  SARS CORONAVIRUS 2 (TAT 6-24 HRS) Nasopharyngeal Nasopharyngeal Swab     Status: None   Collection Time: 04/07/19 11:45 AM   Specimen: Nasopharyngeal Swab  Result Value Ref Range Status   SARS Coronavirus 2 NEGATIVE NEGATIVE Final    Comment: (NOTE) SARS-CoV-2 target nucleic acids are NOT DETECTED. The SARS-CoV-2 RNA is generally detectable in upper and lower respiratory specimens during the acute phase of infection. Negative results do not preclude SARS-CoV-2 infection, do not rule out co-infections with other pathogens,  and should not be used as the sole basis for treatment or other patient management decisions. Negative results must be combined with clinical observations, patient history, and epidemiological information. The expected result is Negative. Fact Sheet for Patients: SugarRoll.be Fact Sheet for Healthcare Providers: https://www.woods-.com/ This test is not yet approved or cleared by the [place] FDA and  has been authorized for detection and/or diagnosis of SARS-CoV-2 by FDA under an Emergency Use Authorization (EUA). This EUA will remain  in effect (meaning this test can be used) for the duration of the COVID-19 declaration under Section 56 4(b)(1) of the Act, 21 U.S.C. section 360bbb-3(b)(1), unless the authorization is terminated or revoked sooner. Performed at [facility], [place] [address] [place], [place] [address]          Radiology Studies: No results found.      Scheduled Meds: . amLODipine  5 mg Oral Daily  . atorvastatin  40 mg Oral q1800  . enoxaparin (LOVENOX) injection  40 mg Subcutaneous Q24H  . ferrous sulfate  325 mg  Oral BID WC  . gabapentin  300 mg Oral 3 times per day  . gabapentin  600 mg Oral QHS  . guaiFENesin  10 mL Oral Q6H  . insulin aspart  0-15 Units Subcutaneous TID WC  . insulin aspart  0-5 Units Subcutaneous QHS  . insulin aspart  4 Units Subcutaneous TID WC  . insulin glargine  25 Units Subcutaneous Daily  . LORazepam  1 mg Intravenous Once  . neomycin-polymyxin-hydrocortisone  4 drop Right EAR Q8H  . nicotine  21 mg Transdermal Daily   Continuous Infusions: . sodium chloride Stopped (04/02/19 1000)     LOS: 10 days     Georgette Shell, MD [name]  If 7PM-7AM, please contact night-coverage [email or web address] Password [facility] 04/10/2019, 11:27 AM

## 2019-04-11 LAB — GLUCOSE, CAPILLARY
Glucose-Capillary: 157 mg/dL — ABNORMAL HIGH (ref 70–99)
Glucose-Capillary: 265 mg/dL — ABNORMAL HIGH (ref 70–99)
Glucose-Capillary: 88 mg/dL (ref 70–99)
Glucose-Capillary: 168 mg/dL — ABNORMAL HIGH (ref 70–99)

## 2019-04-11 NOTE — Progress Notes (Signed)
PROGRESS NOTE  LORAINE FREID GDJ:[id number] DOB: [birth year] DOA: 03/30/2019 PCP: Patient, No Pcp Per  HPI/Recap of past 60 hours: 63 year old homeless male with history of IDDM-2, HTN, chronic hepatitis C, polysubstance abuse, and chronic pain presented with generalized weakness, nausea and poor p.o. intake for 3 days and admitted for sepsis due to CAPandright acute otitis media. Completed course of ceftriaxone and azithromycin.    04/11/19: Seen and examined.  No new complaints.  Has chronic pain for which he takes pain medication.  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 COVID-19 and RVP negative. No respiratory distress. -Completed ceftriaxone and azithromycin for CAP 12/16-12/21.  Acute otitis media of right ear -Antibiotics as above         Labile and uncontrolled type 2 diabetes complicated by with diabetic neuropathy-patient reports his blood sugar has been as high as 1500 and as low as 8!!!!  Hemoglobin A1c 9.1 , 04/01/2019.  CBG stable this morning.  Continue current dose of Lantus and NovoLog.  Continue gabapentin.  Essential hypertension, with transiently uncontrolled hypertension: -Continue amlodipine 5 mg daily -Monitor vital signs, if blood pressure persistently elevated can increase amlodipine to 10 mg daily.  Chronic normocytic anemia: Hgb 11-13 (baseline)>10.2 (admit)>>9.4.9.1  History of chronic hepatitis C: HCV RNA undetectable in 12/2016 and 04/2017.  Tobacco use disorder: Reports smoking about a pack a day. -Encourage cessation -Nicotine patch  Alcohol abuse in remission -Congratulated.  Hyponatremia: Resolved.  Hyperlipidemia continue  statin.  AKI: Resolved  Ambulatory dysfunction/physical debility PT assessed and recommended SNF CSW assisting with SNF placement. Continue PT OT with assistance and fall precautions.  Patient is homeless-await placement  Estimated body mass index is 21.52 kg/m as calculated from the following:   Height as of this encounter: 6' (1.829 m).   Weight as of this encounter: 72 kg.  DVT prophylaxis:Subcu Lovenox daily.    Code Status:Full code Family Communication: None patient has 2 children who lives in [place] but they are not in touch with him. Disposition Plan:Final disposition SNF.  Trying to find bed placement. Consultants:None      Objective: Vitals:   04/10/19 1326 04/10/19 2057 04/11/19 0606 04/11/19 0609  BP: (!) 122/52 136/62  (!) 174/69  Pulse: 77 78  78  Resp: 20 20  20   Temp: 98.2 F (36.8 C) 98.2 F (36.8 C)  97.9 F (36.6 C)  TempSrc: Oral Oral  Oral  SpO2: 96% 96%  99%  Weight:   72.4 kg   Height:        Intake/Output Summary (Last 24 hours) at 04/11/2019 2119 Last data filed at 04/10/2019 1300 Gross per 24 hour  Intake 680 ml  Output 300 ml  Net 380 ml   Filed Weights   04/09/19 0418 04/10/19 0500 04/11/19 0606  Weight: 72.8 kg 72 kg 72.4 kg    Exam:  . General: 63 y.o. year-old male well developed well nourished in no acute distress.  Alert and oriented x3. . Cardiovascular: Regular rate and rhythm with no rubs or gallops.  No thyromegaly or JVD noted.   [name] Respiratory: Clear to auscultation with no wheezes or rales. Good inspiratory effort. . Abdomen: Soft nontender nondistended with normal  bowel sounds x4 quadrants. . Musculoskeletal: No lower extremity edema. 2/4 pulses in all 4 extremities. [name] Psychiatry: Mood is appropriate for condition and setting   Data Reviewed: CBC: Recent Labs  Lab 04/06/19 0445 04/07/19 0357 04/08/19 0444  WBC 7.7  --   --   HGB 9.7* 9.2* 9.1*  HCT 29.4* 29.6* 28.9*  MCV 80.8  --   --    PLT 416*  --   --    Basic Metabolic Panel: Recent Labs  Lab 04/06/19 0445 04/07/19 0357 04/08/19 0444  NA 131* 139 138  K 5.0 4.7 4.7  CL 101 107 103  CO2 21* 25 25  GLUCOSE 332* 212* 210*  BUN 30* 25* 31*  CREATININE 1.16 1.51* 1.38*  CALCIUM 8.5* 8.8* 8.8*  MG 1.9 2.1  --    GFR: Estimated Creatinine Clearance: 56.8 mL/min (A) (by C-G formula based on SCr of 1.38 mg/dL (H)). Liver Function Tests: No results for input(s): AST, ALT, ALKPHOS, BILITOT, PROT, ALBUMIN in the last 168 hours. No results for input(s): LIPASE, AMYLASE in the last 168 hours. No results for input(s): AMMONIA in the last 168 hours. Coagulation Profile: No results for input(s): INR, PROTIME in the last 168 hours. Cardiac Enzymes: No results for input(s): CKTOTAL, CKMB, CKMBINDEX, TROPONINI in the last 168 hours. BNP (last 3 results) No results for input(s): PROBNP in the last 8760 hours. HbA1C: No results for input(s): HGBA1C in the last 72 hours. CBG: Recent Labs  Lab 04/10/19 0605 04/10/19 1140 04/10/19 1654 04/10/19 2059 04/11/19 0556  GLUCAP 152* 121* 299* 231* 157*   Lipid Profile: No results for input(s): CHOL, HDL, LDLCALC, TRIG, CHOLHDL, LDLDIRECT in the last 72 hours. Thyroid Function Tests: No results for input(s): TSH, T4TOTAL, FREET4, T3FREE, THYROIDAB in the last 72 hours. Anemia Panel: No results for input(s): VITAMINB12, FOLATE, FERRITIN, TIBC, IRON, RETICCTPCT in the last 72 hours. Urine analysis:    Component Value Date/Time   COLORURINE YELLOW 03/31/2019 0310   APPEARANCEUR CLEAR 03/31/2019 0310   LABSPEC 1.020 03/31/2019 0310   PHURINE 6.0 03/31/2019 0310   GLUCOSEU >=500 (A) 03/31/2019 0310   GLUCOSEU 500 04/28/2012 1149   HGBUR NEGATIVE 03/31/2019 0310   BILIRUBINUR NEGATIVE 03/31/2019 0310   BILIRUBINUR neg 04/28/2012 1053   KETONESUR NEGATIVE 03/31/2019 0310   PROTEINUR 100 (A) 03/31/2019 0310   UROBILINOGEN 4.0 (H) 11/16/2013 1646   NITRITE NEGATIVE  03/31/2019 0310   LEUKOCYTESUR NEGATIVE 03/31/2019 0310   Sepsis Labs: @LABRCNTIP (procalcitonin:4,lacticidven:4)  ) Recent Results (from the past 240 hour(s))  SARS CORONAVIRUS 2 (TAT 6-24 HRS) Nasopharyngeal Nasopharyngeal Swab     Status: None   Collection Time: 04/07/19 11:45 AM   Specimen: Nasopharyngeal Swab  Result Value Ref Range Status   SARS Coronavirus 2 NEGATIVE NEGATIVE Final    Comment: (NOTE) SARS-CoV-2 target nucleic acids are NOT DETECTED. The SARS-CoV-2 RNA is generally detectable in upper and lower respiratory specimens during the acute phase of infection. Negative results do not preclude SARS-CoV-2 infection, do not rule out co-infections with other pathogens, and should not be used as the sole basis for treatment or other patient management decisions. Negative results must be combined with clinical observations, patient history, and epidemiological information. The expected result is Negative. Fact Sheet for Patients: SugarRoll.be Fact Sheet for Healthcare Providers: https://www.woods-mathews.com/ This test is not yet approved or cleared by the [place] FDA and  has been authorized for detection and/or diagnosis of SARS-CoV-2 by FDA under an Emergency Use Authorization (EUA).  This EUA will remain  in effect (meaning this test can be used) for the duration of the COVID-19 declaration under Section 56 4(b)(1) of the Act, 21 U.S.C. section 360bbb-3(b)(1), unless the authorization is terminated or revoked sooner. Performed at [facility], [place] [address], [place], [place] [address]       Studies: No results found.  Scheduled Meds: . amLODipine  5 mg Oral Daily  . atorvastatin  40 mg Oral q1800  . enoxaparin (LOVENOX) injection  40 mg Subcutaneous Q24H  . ferrous sulfate  325 mg Oral BID WC  . gabapentin  300 mg Oral 3 times per day  . gabapentin  600 mg Oral QHS  . guaiFENesin  10 mL Oral Q6H  .  insulin aspart  0-15 Units Subcutaneous TID WC  . insulin aspart  0-5 Units Subcutaneous QHS  . insulin aspart  4 Units Subcutaneous TID WC  . insulin glargine  25 Units Subcutaneous Daily  . lisinopril  2.5 mg Oral Daily  . LORazepam  1 mg Intravenous Once  . neomycin-polymyxin-hydrocortisone  4 drop Right EAR Q8H  . nicotine  21 mg Transdermal Daily    Continuous Infusions: . sodium chloride Stopped (04/02/19 1000)     LOS: 11 days     Kayleen Memos, MD [name] Pager [phone]  If 7PM-7AM, please contact night-coverage [email or web address] Password [email or web address] 04/11/2019, 8:21 AM

## 2019-04-11 NOTE — Progress Notes (Signed)
Pt called for pain medicine and this Rn told explained that  its not time for the next dose yet. He asked for Tylenol instead. This Rn went in the room with Tylenol and the pt. Said" you're disrespecting me, you are not listening to me" , our conversation did   not even started yet. This Rn asked the pt " what happened?"  Pt.  he said you're not listening, I need my medicine now.  I want another nurse. Pt.  Starting to raise   his voice, upset. Tylenol was given.  pt. Threw med cup to this Rn. CN made aware.

## 2019-04-12 LAB — CBC
HCT: 32.9 % — ABNORMAL LOW (ref 39.0–52.0)
Hemoglobin: 10.3 g/dL — ABNORMAL LOW (ref 13.0–17.0)
MCH: 26.8 pg (ref 26.0–34.0)
MCHC: 31.3 g/dL (ref 30.0–36.0)
MCV: 85.5 fL (ref 80.0–100.0)
Platelets: 535 10*3/uL — ABNORMAL HIGH (ref 150–400)
RBC: 3.85 MIL/uL — ABNORMAL LOW (ref 4.22–5.81)
RDW: 16.4 % — ABNORMAL HIGH (ref 11.5–15.5)
WBC: 11.1 10*3/uL — ABNORMAL HIGH (ref 4.0–10.5)
nRBC: 0 % (ref 0.0–0.2)

## 2019-04-12 LAB — GLUCOSE, CAPILLARY
Glucose-Capillary: 164 mg/dL — ABNORMAL HIGH (ref 70–99)
Glucose-Capillary: 40 mg/dL — CL (ref 70–99)
Glucose-Capillary: 53 mg/dL — ABNORMAL LOW (ref 70–99)
Glucose-Capillary: 224 mg/dL — ABNORMAL HIGH (ref 70–99)
Glucose-Capillary: 240 mg/dL — ABNORMAL HIGH (ref 70–99)

## 2019-04-12 LAB — BASIC METABOLIC PANEL
Anion gap: 10 (ref 5–15)
BUN: 24 mg/dL — ABNORMAL HIGH (ref 8–23)
CO2: 26 mmol/L (ref 22–32)
Calcium: 9.5 mg/dL (ref 8.9–10.3)
Chloride: 103 mmol/L (ref 98–111)
Creatinine, Ser: 1.53 mg/dL — ABNORMAL HIGH (ref 0.61–1.24)
GFR calc Af Amer: 56 mL/min — ABNORMAL LOW (ref >60)
GFR calc non Af Amer: 48 mL/min — ABNORMAL LOW (ref >60)
Glucose, Bld: 127 mg/dL — ABNORMAL HIGH (ref 70–99)
Potassium: 4.8 mmol/L (ref 3.5–5.1)
Sodium: 139 mmol/L (ref 135–145)

## 2019-04-12 MED ORDER — ONDANSETRON HCL 4 MG/2ML IJ SOLN
4.0000 mg | Freq: Four times a day (QID) | INTRAMUSCULAR | Status: DC | PRN
  Administered 2019-04-12 – 2019-04-21 (×14): 4 mg via INTRAVENOUS
  Filled 2019-04-12 (×17): qty 2

## 2019-04-12 MED ORDER — GABAPENTIN 400 MG PO CAPS
400.0000 mg | ORAL_CAPSULE | ORAL | Status: DC
  Administered 2019-04-13 – 2019-04-23 (×28): 400 mg via ORAL
  Filled 2019-04-12 (×29): qty 1

## 2019-04-12 MED ORDER — INSULIN ASPART 100 UNIT/ML ~~LOC~~ SOLN
0.0000 [IU] | Freq: Three times a day (TID) | SUBCUTANEOUS | Status: DC
  Administered 2019-04-12: 17:00:00 3 [IU] via SUBCUTANEOUS
  Administered 2019-04-13: 2 [IU] via SUBCUTANEOUS
  Administered 2019-04-13 – 2019-04-14 (×3): 3 [IU] via SUBCUTANEOUS
  Administered 2019-04-15 (×2): 9 [IU] via SUBCUTANEOUS
  Administered 2019-04-15: 17:00:00 3 [IU] via SUBCUTANEOUS
  Administered 2019-04-16: 7 [IU] via SUBCUTANEOUS
  Administered 2019-04-16: 5 [IU] via SUBCUTANEOUS
  Administered 2019-04-17: 12:00:00 3 [IU] via SUBCUTANEOUS
  Administered 2019-04-17: 7 [IU] via SUBCUTANEOUS
  Administered 2019-04-18: 2 [IU] via SUBCUTANEOUS
  Administered 2019-04-18: 1 [IU] via SUBCUTANEOUS
  Administered 2019-04-18: 9 [IU] via SUBCUTANEOUS
  Administered 2019-04-19: 2 [IU] via SUBCUTANEOUS
  Administered 2019-04-19: 1 [IU] via SUBCUTANEOUS
  Administered 2019-04-20: 13:00:00 2 [IU] via SUBCUTANEOUS
  Administered 2019-04-20: 3 [IU] via SUBCUTANEOUS
  Administered 2019-04-22 (×2): 1 [IU] via SUBCUTANEOUS
  Administered 2019-04-22: 12:00:00 2 [IU] via SUBCUTANEOUS
  Administered 2019-04-23: 5 [IU] via SUBCUTANEOUS

## 2019-04-12 MED ORDER — INSULIN ASPART 100 UNIT/ML ~~LOC~~ SOLN: 0.0000 [IU] | Freq: Every day | SUBCUTANEOUS | Status: DC

## 2019-04-12 MED ORDER — OXYCODONE-ACETAMINOPHEN 5-325 MG PO TABS: 1.0000 | ORAL_TABLET | ORAL | Status: DC | PRN

## 2019-04-12 MED ORDER — OXYCODONE-ACETAMINOPHEN 5-325 MG PO TABS
1.0000 | ORAL_TABLET | ORAL | Status: DC | PRN
  Administered 2019-04-12: 1 via ORAL
  Filled 2019-04-12: qty 1

## 2019-04-12 MED ORDER — PNEUMOCOCCAL VAC POLYVALENT 25 MCG/0.5ML IJ INJ
0.5000 mL | INJECTION | INTRAMUSCULAR | Status: AC
  Administered 2019-04-13: 0.5 mL via INTRAMUSCULAR
  Filled 2019-04-12: qty 0.5

## 2019-04-12 MED ORDER — OXYCODONE-ACETAMINOPHEN 5-325 MG PO TABS
1.0000 | ORAL_TABLET | Freq: Four times a day (QID) | ORAL | Status: DC | PRN
  Administered 2019-04-12: 1 via ORAL
  Administered 2019-04-13 – 2019-04-23 (×37): 2 via ORAL
  Filled 2019-04-12 (×20): qty 2
  Filled 2019-04-12: qty 1
  Filled 2019-04-12 (×17): qty 2

## 2019-04-12 MED ORDER — SENNOSIDES-DOCUSATE SODIUM 8.6-50 MG PO TABS
2.0000 | ORAL_TABLET | Freq: Two times a day (BID) | ORAL | Status: DC
  Administered 2019-04-12 – 2019-04-23 (×22): 2 via ORAL
  Filled 2019-04-12 (×22): qty 2

## 2019-04-12 MED ORDER — INSULIN GLARGINE 100 UNIT/ML ~~LOC~~ SOLN
10.0000 [IU] | Freq: Every day | SUBCUTANEOUS | Status: DC
  Administered 2019-04-13 – 2019-04-14 (×2): 10 [IU] via SUBCUTANEOUS
  Filled 2019-04-12 (×2): qty 0.1

## 2019-04-12 NOTE — Progress Notes (Signed)
PROGRESS NOTE  [name] ZOX:[id number] DOB: 1956-03-18 DOA: 03/30/2019 PCP: Patient, No Pcp Per  HPI/Recap of past 67 hours: 63 year old homeless male with history of IDDM-2, HTN, chronic hepatitis C, polysubstance abuse, and chronic pain presented with generalized weakness, nausea and poor p.o. intake for 3 days and admitted for sepsis due to CAPandright acute otitis media. Completed course of ceftriaxone and azithromycin.    04/12/19: Nausea and vomiting reported this morning likely in the setting of hypoglycemia, IV Zofran as needed ordered.  Reports 9 out of 10 dull pain, generalized.  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 COVID-19 and RVP negative. No respiratory distress. -Completed ceftriaxone and azithromycin for CAP 12/16-12/21.  Acute otitis media of right ear -Antibiotics as above         Labile and uncontrolled type 2 diabetes complicated by with diabetic neuropathy and hypoglycemia-patient reports his blood sugar has been as high as 1500 and as low as 8!!!!  Hemoglobin A1c 9.1 , 04/01/2019.  On gabapentin.  Hypoglycemic this morning.  CVP 40.  Obtain BMP reduce Lantus and NovoLog dose.   Peripheral neuropathy/? Chronic pain syndrome On gabapentin, continue On Percocet 1 tablet every 4 as needed for moderate to severe pain Add bowel regimen to avoid opiate-induced constipation.  Intractable nausea with vomiting likely in the setting of of hypo-glycemia IV Zofran as needed Reduce insulin doses Obtain BMP and CBC to further assess  Essential hypertension Blood pressure is at goal. -Continue amlodipine 5 mg daily -Continue to monitor vital signs.  Chronic normocytic anemia: Hgb  11-13 (baseline)>10.2 (admit)>>9.4.9.1.  Obtain CBC.  History of chronic hepatitis C: HCV RNA undetectable in 12/2016 and 04/2017.  Tobacco use disorder: Reports smoking about a pack a day. -Encourage cessation -Nicotine patch  Alcohol abuse in remission -Congratulated.  Hyponatremia: Resolved.  Obtain BMP.  Hyperlipidemia continue statin.  AKI: Resolved.  Repeat BMP.  Ambulatory dysfunction/physical debility PT assessed and recommended SNF CSW assisting with SNF placement. Continue PT OT with assistance and fall precautions.  Patient is homeless-await placement  Estimated body mass index is 21.52 kg/m as calculated from the following:   Height as of this encounter: 6' (1.829 m).   Weight as of this encounter: 72 kg.  DVT prophylaxis:Subcu Lovenox daily.    Code Status:Full code Family Communication: None patient has 2 children who lives in [place] but they are not in touch with him. Disposition Plan:Final disposition SNF.    Consultants:None      Objective: Vitals:   04/11/19 0609 04/11/19 2028 04/12/19 0046 04/12/19 0853  BP: (!) 174/69 (!) 117/52  110/66  Pulse: 78 73  77  Resp: 20 20  20   Temp: 97.9 F (36.6 C) 98.7 F (37.1 C)    TempSrc: Oral Oral    SpO2: 99% 96%  93%  Weight:   73.4 kg   Height:        Intake/Output Summary (Last 24 hours) at 04/12/2019 1118 Last data filed at 04/12/2019 0900 Gross per 24 hour  Intake 480 ml  Output --  Net 480 ml   Filed Weights   04/10/19 0500 04/11/19 0606 04/12/19 0046  Weight: 72 kg 72.4 kg 73.4 kg    Exam:  .  General: 63 y.o. year-old male well-developed well-nourished no acute distress.  Alert oriented x3.   . Cardiovascular: Regular rate and rhythm no rubs or gallops. [name] Respiratory: Clear to auscultation with no wheezes or rales.  Good inspiratory effort. . Abdomen: Soft nontender normal bowel sounds. . Musculoskeletal: No lower extremity edema. Psychiatry: Mood is appropriate  for condition and setting.  Data Reviewed: CBC: Recent Labs  Lab 04/06/19 0445 04/07/19 0357 04/08/19 0444  WBC 7.7  --   --   HGB 9.7* 9.2* 9.1*  HCT 29.4* 29.6* 28.9*  MCV 80.8  --   --   PLT 416*  --   --    Basic Metabolic Panel: Recent Labs  Lab 04/06/19 0445 04/07/19 0357 04/08/19 0444  NA 131* 139 138  K 5.0 4.7 4.7  CL 101 107 103  CO2 21* 25 25  GLUCOSE 332* 212* 210*  BUN 30* 25* 31*  CREATININE 1.16 1.51* 1.38*  CALCIUM 8.5* 8.8* 8.8*  MG 1.9 2.1  --    GFR: Estimated Creatinine Clearance: 57.6 mL/min (A) (by C-G formula based on SCr of 1.38 mg/dL (H)). Liver Function Tests: No results for input(s): AST, ALT, ALKPHOS, BILITOT, PROT, ALBUMIN in the last 168 hours. No results for input(s): LIPASE, AMYLASE in the last 168 hours. No results for input(s): AMMONIA in the last 168 hours. Coagulation Profile: No results for input(s): INR, PROTIME in the last 168 hours. Cardiac Enzymes: No results for input(s): CKTOTAL, CKMB, CKMBINDEX, TROPONINI in the last 168 hours. BNP (last 3 results) No results for input(s): PROBNP in the last 8760 hours. HbA1C: No results for input(s): HGBA1C in the last 72 hours. CBG: Recent Labs  Lab 04/11/19 0556 04/11/19 1125 04/11/19 1622 04/11/19 2012 04/12/19 0627  GLUCAP 157* 265* 88 168* 164*   Lipid Profile: No results for input(s): CHOL, HDL, LDLCALC, TRIG, CHOLHDL, LDLDIRECT in the last 72 hours. Thyroid Function Tests: No results for input(s): TSH, T4TOTAL, FREET4, T3FREE, THYROIDAB in the last 72 hours. Anemia Panel: No results for input(s): VITAMINB12, FOLATE, FERRITIN, TIBC, IRON, RETICCTPCT in the last 72 hours. Urine analysis:    Component Value Date/Time   COLORURINE YELLOW 03/31/2019 0310   APPEARANCEUR CLEAR 03/31/2019 0310   LABSPEC 1.020 03/31/2019 0310   PHURINE 6.0 03/31/2019 0310   GLUCOSEU >=500 (A) 03/31/2019 0310   GLUCOSEU 500 04/28/2012 1149   HGBUR NEGATIVE 03/31/2019 0310   BILIRUBINUR  NEGATIVE 03/31/2019 0310   BILIRUBINUR neg 04/28/2012 1053   KETONESUR NEGATIVE 03/31/2019 0310   PROTEINUR 100 (A) 03/31/2019 0310   UROBILINOGEN 4.0 (H) 11/16/2013 1646   NITRITE NEGATIVE 03/31/2019 0310   LEUKOCYTESUR NEGATIVE 03/31/2019 0310   Sepsis Labs: @LABRCNTIP (procalcitonin:4,lacticidven:4)  ) Recent Results (from the past 240 hour(s))  SARS CORONAVIRUS 2 (TAT 6-24 HRS) Nasopharyngeal Nasopharyngeal Swab     Status: None   Collection Time: 04/07/19 11:45 AM   Specimen: Nasopharyngeal Swab  Result Value Ref Range Status   SARS Coronavirus 2 NEGATIVE NEGATIVE Final    Comment: (NOTE) SARS-CoV-2 target nucleic acids are NOT DETECTED. The SARS-CoV-2 RNA is generally detectable in upper and lower respiratory specimens during the acute phase of infection. Negative results do not preclude SARS-CoV-2 infection, do not rule out co-infections with other pathogens, and should not be used as the sole basis for treatment or other patient management decisions. Negative results must be combined with clinical observations, patient history, and epidemiological information. The expected result is Negative. Fact Sheet for Patients: SugarRoll.be Fact Sheet for  Healthcare Providers: https://www.woods-mathews.com/ This test is not yet approved or cleared by the [place] and  has been authorized for detection and/or diagnosis of SARS-CoV-2 by FDA under an Emergency Use Authorization (EUA). This EUA will remain  in effect (meaning this test can be used) for the duration of the COVID-19 declaration under Section 56 4(b)(1) of the Act, 21 U.S.C. section 360bbb-3(b)(1), unless the authorization is terminated or revoked sooner. Performed at [facility], [place] [address], [place], [place] [address]       Studies: No results found.  Scheduled Meds: . amLODipine  5 mg Oral Daily  . atorvastatin  40 mg Oral q1800  . enoxaparin  (LOVENOX) injection  40 mg Subcutaneous Q24H  . ferrous sulfate  325 mg Oral BID WC  . gabapentin  300 mg Oral 3 times per day  . gabapentin  600 mg Oral QHS  . guaiFENesin  10 mL Oral Q6H  . insulin aspart  0-15 Units Subcutaneous TID WC  . insulin aspart  0-5 Units Subcutaneous QHS  . insulin aspart  4 Units Subcutaneous TID WC  . insulin glargine  25 Units Subcutaneous Daily  . lisinopril  2.5 mg Oral Daily  . LORazepam  1 mg Intravenous Once  . neomycin-polymyxin-hydrocortisone  4 drop Right EAR Q8H  . nicotine  21 mg Transdermal Daily    Continuous Infusions: . sodium chloride Stopped (04/02/19 1000)     LOS: 12 days     Kayleen Memos, MD [name] Pager [phone]  If 7PM-7AM, please contact night-coverage [email or web address] Password [email or web address] 04/12/2019, 11:18 AM

## 2019-04-12 NOTE — Progress Notes (Signed)
Physical Therapy Treatment Patient Details Name: [name] MRN: [id number] DOB: 14-Apr-1956 Today's Date: 04/12/2019    History of Present Illness Pt is a 63 y/o male with PMH of DM, HTN, chronic hepatitis C, polysubstance abuse, CKD III, presents with generalized weakness, decreased PO intake, and nausea over the last 3 days. Found with sepsis secondary to community acquired pneumonia.     PT Comments    Patient seen for mobility progression. Pt in bed upon arrival and agreeable to participate in therapy. Pt reports nausea and emesis prior to session. Pt with c/o dizziness while ambulating which limited distance this session. Pt continues to present with generalized weakness and impaired balance. Continue to progress as tolerated with anticipated d/c to SNF for further skilled PT services.     Follow Up Recommendations  SNF     Equipment Recommendations  Rolling walker with 5" wheels    Recommendations for Other Services       Precautions / Restrictions Precautions Precautions: Fall Restrictions Weight Bearing Restrictions: No    Mobility  Bed Mobility Overal bed mobility: Independent                Transfers Overall transfer level: Needs assistance Equipment used: Rolling walker (2 wheeled) Transfers: Sit to/from Stand Sit to Stand: Min assist         General transfer comment: assist to steady  Ambulation/Gait Ambulation/Gait assistance: Min guard;Min assist Gait Distance (Feet): 140 Feet Assistive device: Rolling walker (2 wheeled) Gait Pattern/deviations: Step-through pattern;Decreased stride length;Decreased dorsiflexion - right;Decreased dorsiflexion - left Gait velocity: decreased   General Gait Details: pt a little more unsteady this session and with c/o dizziness that subsided with return to sitting; increased assist with turning    Stairs             Wheelchair Mobility    Modified Rankin (Stroke Patients Only)       Balance  Overall balance assessment: Needs assistance Sitting-balance support: Feet supported Sitting balance-Leahy Scale: Good     Standing balance support: Bilateral upper extremity supported;During functional activity Standing balance-Leahy Scale: Poor                              Cognition Arousal/Alertness: Awake/alert Behavior During Therapy: WFL for tasks assessed/performed Overall Cognitive Status: No family/caregiver present to determine baseline cognitive functioning Area of Impairment: Safety/judgement;Memory                     Memory: Decreased short-term memory   Safety/Judgement: Decreased awareness of safety;Decreased awareness of deficits            Exercises      General Comments        Pertinent Vitals/Pain Pain Assessment: Faces Faces Pain Scale: No hurt    Home Living                      Prior Function            PT Goals (current goals can now be found in the care plan section) Acute Rehab PT Goals Patient Stated Goal: to get stronger and get life together Progress towards PT goals: Progressing toward goals    Frequency    Min 2X/week      PT Plan Current plan remains appropriate    Co-evaluation              AM-PAC PT "6 Clicks" Mobility  Outcome Measure  Help needed turning from your back to your side while in a flat bed without using bedrails?: None Help needed moving from lying on your back to sitting on the side of a flat bed without using bedrails?: None Help needed moving to and from a bed to a chair (including a wheelchair)?: None Help needed standing up from a chair using your arms (e.g., wheelchair or bedside chair)?: None Help needed to walk in hospital room?: A Little Help needed climbing 3-5 steps with a railing? : A Lot 6 Click Score: 21    End of Session Equipment Utilized During Treatment: Gait belt Activity Tolerance: Patient tolerated treatment well Patient left: in bed;with  call bell/phone within reach Nurse Communication: Mobility status PT Visit Diagnosis: Unsteadiness on feet (R26.81);Muscle weakness (generalized) (M62.81);Difficulty in walking, not elsewhere classified (R26.2)     Time: 5217-4715 PT Time Calculation (min) (ACUTE ONLY): 22 min  Charges:  $Gait Training: 8-22 mins                     Earney Navy, PTA Acute Rehabilitation Services Pager: [phone] Office: [phone]     Darliss Cheney 04/12/2019, 11:38 AM

## 2019-04-12 NOTE — Progress Notes (Signed)
Inpatient Diabetes Program Recommendations  AACE/ADA: New Consensus Statement on Inpatient Glycemic Control (2015)  Target Ranges:  Prepandial:   less than 140 mg/dL      Peak postprandial:   less than 180 mg/dL (1-2 hours)      Critically ill patients:  140 - 180 mg/dL   Lab Results  Component Value Date   GLUCAP 53 (L) 04/12/2019   HGBA1C 9.1 (H) 04/01/2019    Review of Glycemic Control Results for ALEM, [facility] (MRN [id number]) as of 04/12/2019 12:42  Ref. Range 04/11/2019 20:12 04/12/2019 06:27 04/12/2019 10:47 04/12/2019 11:14  Glucose-Capillary Latest Ref Range: 70 - 99 mg/dL 168 (H) 164 (H) 40 (LL) 53 (L)   Diabetes history: Type 1 DM (requires basal/coverage with meals) Outpatient Diabetes medications: Novolog 70/30 15 units BID Current orders for Inpatient glycemic control: Lantus 10 units QD, Novolog 0-15 units TID, Novolog 0-5 QHS  Inpatient Diabetes Program Recommendations:    Noted hypoglycemia today of 40 mg/dL and subsequent insulin order changes. Question based off charting if meal coverage was given earlier than time of meal consumption.  Would also recommend decreasing correction to Novolog 0-9 units TID. Anticipate since patient is type 1 patient will require meal coverage: Once hypoglycemia resolves consider Novolog 2 units TID (Assuming patient is consuming >50% of meal).   Thanks, [name] Curb, MSN, RNC-OB Diabetes Coordinator [phone] (8a-5p)

## 2019-04-13 LAB — GLUCOSE, CAPILLARY
Glucose-Capillary: 101 mg/dL — ABNORMAL HIGH (ref 70–99)
Glucose-Capillary: 230 mg/dL — ABNORMAL HIGH (ref 70–99)
Glucose-Capillary: 189 mg/dL — ABNORMAL HIGH (ref 70–99)
Glucose-Capillary: 104 mg/dL — ABNORMAL HIGH (ref 70–99)

## 2019-04-13 MED ORDER — INSULIN ASPART 100 UNIT/ML ~~LOC~~ SOLN
2.0000 [IU] | Freq: Three times a day (TID) | SUBCUTANEOUS | Status: DC
  Administered 2019-04-13 – 2019-04-14 (×2): 2 [IU] via SUBCUTANEOUS

## 2019-04-13 NOTE — TOC Progression Note (Addendum)
Transition of Care [facility]) - Progression Note    Patient Details  Name: [name] MRN: [id number] Date of Birth: [birth year]  Transition of Care [facility]) CM/SW [place], [place] Phone Number: [phone] 04/13/2019, 9:15 AM  Clinical Narrative:     Update: [facility] reports patient's insurance is not in their network, unable to accept. Pending response from [facility] at this time.   CSW has reached out to [facility] to review patient's referral again. CSW has also reached out to [facility] to get an update on referral that was sent, they report they have declined patient.   Patient continues to have no bed offers at this time. Plan to continue to work with PT as frequently as possible to progress with mobility to discharge to shelter.    Expected Discharge Plan: Skilled Nursing Facility Barriers to Discharge: Homeless with medical needs, SNF Pending bed offer  Expected Discharge Plan and Services Expected Discharge Plan: [place] arrangements for the past 2 months: Hotel/Motel([facility])                                       Social Determinants of Health (SDOH) Interventions    Readmission Risk Interventions Readmission Risk Prevention Plan 08/12/2018  Transportation Screening Complete  Medication Review Press photographer) Complete  PCP or Specialist appointment within 3-5 days of discharge Complete  HRI or [place] Complete  SW Recovery Care/Counseling Consult Complete  [place] Not Applicable  Some recent data might be hidden

## 2019-04-13 NOTE — Progress Notes (Signed)
Occupational Therapy Treatment Patient Details Name: [name] MRN: [id number] DOB: Apr 30, 1955 Today's Date: 04/13/2019    History of present illness Pt is a 63 y/o male with PMH of DM, HTN, chronic hepatitis C, polysubstance abuse, CKD III, presents with generalized weakness, decreased PO intake, and nausea over the last 3 days. Found with sepsis secondary to community acquired pneumonia.    OT comments  Pt making steady progress towards OT goals this session. Pt complete full shower with supervision needing cues for safety as pt slightly impulsive with movement. Pt completed standing grooming tasks at sink with supervision needing occasional cues to located needed items as pt reports he doesn't have his glasses and can't read the labels. Pt complete UB/LB dressing from EOB with set-up/ supervision. Pt completed functional mobility greater than a household distance in hallway with RW and min guard for safety. Pt awaiting SNF placement, will continue to follow acutely per POC.    Follow Up Recommendations  SNF    Equipment Recommendations  3 in 1 bedside commode    Recommendations for Other Services      Precautions / Restrictions Precautions Precautions: Fall Restrictions Weight Bearing Restrictions: No       Mobility Bed Mobility Overal bed mobility: Independent                Transfers Overall transfer level: Needs assistance Equipment used: Rolling walker (2 wheeled) Transfers: Sit to/from Stand Sit to Stand: Min assist;Min guard         General transfer comment: MIN A from shower seat for safety, min guard from EOB and chair    Balance Overall balance assessment: Needs assistance Sitting-balance support: Feet supported Sitting balance-Leahy Scale: Good Sitting balance - Comments: able to don socks/ pants EOB   Standing balance support: During functional activity Standing balance-Leahy Scale: Fair Standing balance comment: close minguard for safety                           ADL either performed or assessed with clinical judgement   ADL Overall ADL's : Needs assistance/impaired     Grooming: Oral care;Wash/dry face;Applying deodorant;Brushing hair;Supervision/safety;Standing Grooming Details (indicate cue type and reason): supervision to locate items as pt reports he doesn't have his glasses Upper Body Bathing: Set up;Sitting;Supervision/ safety   Lower Body Bathing: Sitting/lateral leans;Supervison/ safety;Set up   Upper Body Dressing : Set up;Sitting   Lower Body Dressing: Supervision/safety;Set up;Sit to/from stand   Toilet Transfer: Min guard;Ambulation;RW Toilet Transfer Details (indicate cue type and reason): simulated via functional mobility in room     Tub/ Shower Transfer: Walk-in shower;3 in 1;Grab bars;Min guard;Cueing for Immunologist Details (indicate cue type and reason): \ Functional mobility during ADLs: Min guard;Rolling walker;Supervision/safety General ADL Comments: session focus on full shower with supervision/ set- up for safety, standing grooming tasks, functional mobility  and UB/LB dressing     Vision Baseline Vision/History: Wears glasses Wears Glasses: Reading only(doesn't have them with him)     Perception     Praxis      Cognition Arousal/Alertness: Awake/alert Behavior During Therapy: WFL for tasks assessed/performed;Restless;Impulsive Overall Cognitive Status: No family/caregiver present to determine baseline cognitive functioning Area of Impairment: Safety/judgement;Memory                     Memory: Decreased short-term memory   Safety/Judgement: Decreased awareness of safety;Decreased awareness of deficits     General Comments: decreased short term  memory as pt continues to ask therapist name although previously stated x2, cues throuhgout for safety as pt impulsive with movement noted to pick RW up during mobility        Exercises     Shoulder  Instructions       General Comments      Pertinent Vitals/ Pain       Pain Assessment: No/denies pain  Home Living                                          Prior Functioning/Environment              Frequency  Min 2X/week        Progress Toward Goals  OT Goals(current goals can now be found in the care plan section)  Progress towards OT goals: Progressing toward goals  Acute Rehab OT Goals Patient Stated Goal: to get stronger and get life together Time For Goal Achievement: 04/16/19 Potential to Achieve Goals: Good  Plan Discharge plan remains appropriate    Co-evaluation                 AM-PAC OT "6 Clicks" Daily Activity     Outcome Measure   Help from another person eating meals?: None Help from another person taking care of personal grooming?: A Little Help from another person toileting, which includes using toliet, bedpan, or urinal?: A Little Help from another person bathing (including washing, rinsing, drying)?: A Little Help from another person to put on and taking off regular upper body clothing?: A Little Help from another person to put on and taking off regular lower body clothing?: None 6 Click Score: 20    End of Session Equipment Utilized During Treatment: Rolling walker;Gait belt  OT Visit Diagnosis: Other abnormalities of gait and mobility (R26.89);Muscle weakness (generalized) (M62.81)   Activity Tolerance Patient tolerated treatment well   Patient Left in bed;with call bell/phone within reach   Nurse Communication          Time: 0762-2633 OT Time Calculation (min): 35 min  Charges: OT General Charges $OT Visit: 1 Visit OT Treatments $Self Care/Home Management : 23-37 mins   Lanier Clam., COTA/L Acute Rehabilitation Services [phone]    Ihor Gully 04/13/2019, 1:07 PM

## 2019-04-13 NOTE — Progress Notes (Signed)
PROGRESS NOTE  BERTEL VENARD WUJ:[id number] DOB: 1955-11-26 DOA: 03/30/2019 PCP: Patient, No Pcp Per  HPI/Recap of past 54 hours: 63 year old homeless male with history of IDDM-2, HTN, chronic hepatitis C, polysubstance abuse, and chronic pain presented with generalized weakness, nausea and poor p.o. intake for 3 days and admitted for sepsis due to CAPandright acute otitis media. Completed course of ceftriaxone and azithromycin.  Hospital course complicated by labile blood sugars.  Decreased insulin doses.  Generalized weakness.  PT OT recommended SNF.  CSW assisting with placement, no bed offers.  04/13/19: seen and examined.  No new complaints.   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 COVID-19 and RVP negative. No respiratory distress. -Completed ceftriaxone and azithromycin for CAP 12/16-12/21.  Acute otitis media of right ear -Antibiotics as above         Labile and uncontrolled type 2 diabetes complicated by with diabetic neuropathy and hypoglycemia-patient reports his blood sugar has been as high as 1500 and as low as 8!!!!  Hemoglobin A1c 9.1 , 04/01/2019.  On gabapentin.  Blood sugar little high this morning.  Added NovoLog 2 units 3 times daily.  Continue Lantus 10 units nightly and sensitive insulin sliding scale.  Peripheral neuropathy/?  Diffuse joint pain.   On gabapentin, increased gabapentin dose. On Percocet 1-2 tablets every 6 as needed for severe joint pain Continue bowel regimen to avoid opiate-induced constipation.  Intractable nausea with vomiting likely in the setting of of hypo-glycemia IV Zofran as needed Reduce insulin doses Obtain BMP and CBC to further assess  Essential  hypertension Blood pressure is at goal. -Continue amlodipine 5 mg daily -Continue to monitor vital signs.  Chronic normocytic anemia: Hgb 11-13 (baseline)>10.2 (admit)>>9.4.9.1.  Obtain CBC.  History of chronic hepatitis C: HCV RNA undetectable in 12/2016 and 04/2017.  Tobacco use disorder: Reports smoking about a pack a day. -Encourage cessation -Nicotine patch  Alcohol abuse in remission -Congratulated.  Hyponatremia: Resolved.  Obtain BMP.  Hyperlipidemia continue statin.  AKI: Intravascular volume contracted, encourage to increase oral fluid intake.  Ambulatory dysfunction/physical debility PT assessed and recommended SNF CSW assisting with SNF placement. Continue PT OT with assistance and fall precautions.  Patient is homeless-await placement  Estimated body mass index is 21.52 kg/m as calculated from the following:   Height as of this encounter: 6' (1.829 m).   Weight as of this encounter: 72 kg.  DVT prophylaxis:Subcu Lovenox daily.    Code Status:Full code Family Communication: None patient has 2 children who lives in [place] but they are not in touch with him. Disposition Plan:Final disposition SNF.    Consultants:None      Objective: Vitals:   04/12/19 1624 04/12/19 1940 04/13/19 0432 04/13/19 1215  BP: (!) 127/57 (!) 143/67 (!) 132/58 138/67  Pulse: 72 77 84 79  Resp: 19 18 18 18   Temp: 98 F (36.7 C) 98.1 F (36.7 C) 98.6 F (37 C) 98.1 F (36.7 C)  TempSrc: Oral Oral  Oral  SpO2: 97% 97% 96% 91%  Weight:   73.3 kg   Height:        Intake/Output Summary (Last 24 hours) at 04/13/2019 1258 Last data filed at 04/12/2019 1800 Gross per 24 hour  Intake 297 ml  Output --  Net 297 ml   Filed Weights   04/11/19 0606 04/12/19 0046 04/13/19 0432  Weight: 72.4 kg 73.4 kg 73.3 kg    Exam:  . General: 63 y.o. year-old male well-developed well-nourished no acute stress.   . Cardiovascular: Regular rate and rhythm no rubs or  gallops. [name] Respiratory: Clear to auscultation no wheezing no rales.   . Abdomen: Soft nontender normal bowel sounds present.   . Musculoskeletal: No lower extremity edema. Psychiatry: Mood is appropriate for condition and setting.  Data Reviewed: CBC: Recent Labs  Lab 04/07/19 0357 04/08/19 0444 04/12/19 1222  WBC  --   --  11.1*  HGB 9.2* 9.1* 10.3*  HCT 29.6* 28.9* 32.9*  MCV  --   --  85.5  PLT  --   --  408*   Basic Metabolic Panel: Recent Labs  Lab 04/07/19 0357 04/08/19 0444 04/12/19 1222  NA 139 138 139  K 4.7 4.7 4.8  CL 107 103 103  CO2 25 25 26   GLUCOSE 212* 210* 127*  BUN 25* 31* 24*  CREATININE 1.51* 1.38* 1.53*  CALCIUM 8.8* 8.8* 9.5  MG 2.1  --   --    GFR: Estimated Creatinine Clearance: 51.9 mL/min (A) (by C-G formula based on SCr of 1.53 mg/dL (H)). Liver Function Tests: No results for input(s): AST, ALT, ALKPHOS, BILITOT, PROT, ALBUMIN in the last 168 hours. No results for input(s): LIPASE, AMYLASE in the last 168 hours. No results for input(s): AMMONIA in the last 168 hours. Coagulation Profile: No results for input(s): INR, PROTIME in the last 168 hours. Cardiac Enzymes: No results for input(s): CKTOTAL, CKMB, CKMBINDEX, TROPONINI in the last 168 hours. BNP (last 3 results) No results for input(s): PROBNP in the last 8760 hours. HbA1C: No results for input(s): HGBA1C in the last 72 hours. CBG: Recent Labs  Lab 04/12/19 1114 04/12/19 1551 04/12/19 2109 04/13/19 0608 04/13/19 1138  GLUCAP 53* 224* 240* 101* 230*   Lipid Profile: No results for input(s): CHOL, HDL, LDLCALC, TRIG, CHOLHDL, LDLDIRECT in the last 72 hours. Thyroid Function Tests: No results for input(s): TSH, T4TOTAL, FREET4, T3FREE, THYROIDAB in the last 72 hours. Anemia Panel: No results for input(s): VITAMINB12, FOLATE, FERRITIN, TIBC, IRON, RETICCTPCT in the last 72 hours. Urine analysis:    Component Value Date/Time   COLORURINE YELLOW 03/31/2019 0310    APPEARANCEUR CLEAR 03/31/2019 0310   LABSPEC 1.020 03/31/2019 0310   PHURINE 6.0 03/31/2019 0310   GLUCOSEU >=500 (A) 03/31/2019 0310   GLUCOSEU 500 04/28/2012 1149   HGBUR NEGATIVE 03/31/2019 0310   BILIRUBINUR NEGATIVE 03/31/2019 0310   BILIRUBINUR neg 04/28/2012 1053   KETONESUR NEGATIVE 03/31/2019 0310   PROTEINUR 100 (A) 03/31/2019 0310   UROBILINOGEN 4.0 (H) 11/16/2013 1646   NITRITE NEGATIVE 03/31/2019 0310   LEUKOCYTESUR NEGATIVE 03/31/2019 0310   Sepsis Labs: @LABRCNTIP (procalcitonin:4,lacticidven:4)  ) Recent Results (from the past 240 hour(s))  SARS CORONAVIRUS 2 (TAT 6-24 HRS) Nasopharyngeal Nasopharyngeal Swab     Status: None   Collection Time: 04/07/19 11:45 AM   Specimen: Nasopharyngeal Swab  Result Value Ref Range Status   SARS Coronavirus 2 NEGATIVE NEGATIVE Final    Comment: (NOTE) SARS-CoV-2 target nucleic acids are NOT DETECTED. The SARS-CoV-2 RNA is generally detectable in upper and lower respiratory specimens during the acute phase of infection. Negative results do not preclude SARS-CoV-2 infection, do not rule out co-infections with other pathogens, and should not be used as the sole  basis for treatment or other patient management decisions. Negative results must be combined with clinical observations, patient history, and epidemiological information. The expected result is Negative. Fact Sheet for Patients: SugarRoll.be Fact Sheet for Healthcare Providers: https://www.woods-mathews.com/ This test is not yet approved or cleared by the [place] FDA and  has been authorized for detection and/or diagnosis of SARS-CoV-2 by FDA under an Emergency Use Authorization (EUA). This EUA will remain  in effect (meaning this test can be used) for the duration of the COVID-19 declaration under Section 56 4(b)(1) of the Act, 21 U.S.C. section 360bbb-3(b)(1), unless the authorization is terminated or revoked  sooner. Performed at [facility], [place] [address], [place], [place] [address]       Studies: No results found.  Scheduled Meds: . amLODipine  5 mg Oral Daily  . atorvastatin  40 mg Oral q1800  . enoxaparin (LOVENOX) injection  40 mg Subcutaneous Q24H  . ferrous sulfate  325 mg Oral BID WC  . gabapentin  400 mg Oral 3 times per day  . gabapentin  600 mg Oral QHS  . guaiFENesin  10 mL Oral Q6H  . insulin aspart  0-5 Units Subcutaneous QHS  . insulin aspart  0-9 Units Subcutaneous TID WC  . insulin glargine  10 Units Subcutaneous Daily  . lisinopril  2.5 mg Oral Daily  . LORazepam  1 mg Intravenous Once  . neomycin-polymyxin-hydrocortisone  4 drop Right EAR Q8H  . nicotine  21 mg Transdermal Daily  . senna-docusate  2 tablet Oral BID    Continuous Infusions: . sodium chloride Stopped (04/02/19 1000)     LOS: 13 days     Kayleen Memos, MD [name] Pager [phone]  If 7PM-7AM, please contact night-coverage [email or web address] Password [facility] 04/13/2019, 12:58 PM

## 2019-04-14 LAB — GLUCOSE, CAPILLARY
Glucose-Capillary: 182 mg/dL — ABNORMAL HIGH (ref 70–99)
Glucose-Capillary: 225 mg/dL — ABNORMAL HIGH (ref 70–99)
Glucose-Capillary: 232 mg/dL — ABNORMAL HIGH (ref 70–99)
Glucose-Capillary: 244 mg/dL — ABNORMAL HIGH (ref 70–99)
Glucose-Capillary: 57 mg/dL — ABNORMAL LOW (ref 70–99)
Glucose-Capillary: 87 mg/dL (ref 70–99)

## 2019-04-14 MED ORDER — INSULIN GLARGINE 100 UNIT/ML ~~LOC~~ SOLN
6.0000 [IU] | Freq: Every day | SUBCUTANEOUS | Status: DC
  Administered 2019-04-15: 6 [IU] via SUBCUTANEOUS
  Filled 2019-04-14 (×2): qty 0.06

## 2019-04-14 NOTE — Progress Notes (Signed)
Patient refuses to cooperate with bed alarm. Will get up any ways and state to "shut that thing off". Patient uses walker to get around and benefits with standby. Will continue to educate patient and help when able.

## 2019-04-14 NOTE — Progress Notes (Signed)
Pt refusing bed alarm. Pt educated on safety measures and still refuses bed alarm.

## 2019-04-14 NOTE — Progress Notes (Signed)
PROGRESS NOTE  [name] NID:[id number] DOB: 03/26/1956 DOA: 03/30/2019 PCP: Patient, No Pcp Per  HPI/Recap of past 9 hours: 63 year old homeless male with history of IDDM-2, HTN, chronic hepatitis C, polysubstance abuse, and chronic pain presented with generalized weakness, nausea and poor p.o. intake for 3 days and admitted for sepsis due to CAPandright acute otitis media. Completed course of ceftriaxone and azithromycin.  Hospital course complicated by labile blood sugars.  Decreased insulin doses.  Generalized weakness.  PT OT recommended SNF.  CSW assisting with placement, no bed offers.  04/14/19: Seen and examined.  Hypoglycemic this morning.  Treated.  Decreased insulin doses.  Working with physical therapy and awaiting placement to SNF.  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 COVID-19 and RVP negative. No respiratory distress. -Completed ceftriaxone and azithromycin for CAP 12/16-12/21.  Resolved acute otitis media of right ear -Antibiotics completion as above         Labile and uncontrolled type 2 diabetes complicated by with diabetic neuropathy and hypoglycemia-patient reports his blood sugar has been as high as 1500 and as low as 8!!!!  Hemoglobin A1c 9.1 , 04/01/2019.  On gabapentin.  Hypoglycemic on 04/14/2019 morning, Lantus dose reduced to 60 units nightly.  DC scheduled NovoLog.  Continue sensitive insulin sliding scale.   Peripheral neuropathy/?  Diffuse joint pain.   On gabapentin, increased gabapentin dose. On Percocet 1-2 tablets every 6 as needed for severe joint pain Continue bowel regimen to avoid opiate-induced constipation.  Resolved intractable nausea with  vomiting likely in the setting of of hypo-glycemia IV Zofran as needed Reduce insulin doses  Essential hypertension Blood pressure is at goal -Continue amlodipine 5 mg daily -Continue to monitor vital signs.  Chronic normocytic anemia: Hgb 11-13 (baseline)>10.2 (admit)>>9.4.9.1.  Obtain CBC.  History of chronic hepatitis C: HCV RNA undetectable in 12/2016 and 04/2017.  Tobacco use disorder: Reports smoking about a pack a day. -Encourage cessation -Nicotine patch  Alcohol abuse in remission -Congratulated.  Hyponatremia: Resolved.  Obtain BMP.  Hyperlipidemia continue statin.  AKI: Intravascular volume contracted, encourage to increase oral fluid intake.  Ambulatory dysfunction/physical debility PT assessed and recommended SNF CSW assisting with SNF placement. Continue PT OT with assistance and fall precautions.  Patient is homeless-await placement  Estimated body mass index is 21.52 kg/m as calculated from the following:   Height as of this encounter: 6' (1.829 m).   Weight as of this encounter: 72 kg.  DVT prophylaxis:Subcu Lovenox daily.    Code Status:Full code Family Communication: None patient has 2 children who lives in [place] but they are not in touch with him. Disposition Plan:Final disposition SNF.    Consultants:None      Objective: Vitals:   04/13/19 1215 04/13/19 1956 04/14/19 0427 04/14/19 1154  BP: 138/67 128/63 (!) 127/59 (!) 120/45  Pulse: 79 77 78 78  Resp: 18 18 18 18   Temp: 98.1 F (36.7 C) 98.4 F (36.9 C) 98.2 F (36.8 C) 98.7 F (37.1 C)  TempSrc: Oral Oral Oral Oral  SpO2: 91% 94% 97% 97%  Weight:   73.2 kg   Height:        Intake/Output Summary (Last 24 hours) at 04/14/2019 1203 Last data filed  at 04/14/2019 0910 Gross per 24 hour  Intake 360 ml  Output -  Net 360 ml   Filed Weights   04/12/19 0046 04/13/19 0432 04/14/19 0427  Weight: 73.4 kg 73.3 kg 73.2 kg    Exam:  . General: 63 y.o.  year-old male Well-Developed Well-Nourished No Acute Stress.  Alert and oriented x3. . Cardiovascular: Regular rate and rhythm no rubs or gallops.  [name]  Respiratory: Clear to Auscultation No Wheezing No Rales.   . Abdomen: Soft nontender normal bowel sounds present.   Musculoskeletal: No lower extremity edema bilaterally.   Psychiatry: Mood is appropriate for condition and setting.   Data Reviewed: CBC: Recent Labs  Lab 04/08/19 0444 04/12/19 1222  WBC  --  11.1*  HGB 9.1* 10.3*  HCT 28.9* 32.9*  MCV  --  85.5  PLT  --  517*   Basic Metabolic Panel: Recent Labs  Lab 04/08/19 0444 04/12/19 1222  NA 138 139  K 4.7 4.8  CL 103 103  CO2 25 26  GLUCOSE 210* 127*  BUN 31* 24*  CREATININE 1.38* 1.53*  CALCIUM 8.8* 9.5   GFR: Estimated Creatinine Clearance: 51.8 mL/min (A) (by C-G formula based on SCr of 1.53 mg/dL (H)). Liver Function Tests: No results for input(s): AST, ALT, ALKPHOS, BILITOT, PROT, ALBUMIN in the last 168 hours. No results for input(s): LIPASE, AMYLASE in the last 168 hours. No results for input(s): AMMONIA in the last 168 hours. Coagulation Profile: No results for input(s): INR, PROTIME in the last 168 hours. Cardiac Enzymes: No results for input(s): CKTOTAL, CKMB, CKMBINDEX, TROPONINI in the last 168 hours. BNP (last 3 results) No results for input(s): PROBNP in the last 8760 hours. HbA1C: No results for input(s): HGBA1C in the last 72 hours. CBG: Recent Labs  Lab 04/13/19 2115 04/14/19 0007 04/14/19 0623 04/14/19 1111 04/14/19 1151  GLUCAP 104* 182* 225* 57* 87   Lipid Profile: No results for input(s): CHOL, HDL, LDLCALC, TRIG, CHOLHDL, LDLDIRECT in the last 72 hours. Thyroid Function Tests: No results for input(s): TSH, T4TOTAL, FREET4, T3FREE, THYROIDAB in the last 72 hours. Anemia Panel: No results for input(s): VITAMINB12, FOLATE, FERRITIN, TIBC, IRON, RETICCTPCT in the last 72 hours. Urine analysis:    Component Value Date/Time    COLORURINE YELLOW 03/31/2019 0310   APPEARANCEUR CLEAR 03/31/2019 0310   LABSPEC 1.020 03/31/2019 0310   PHURINE 6.0 03/31/2019 0310   GLUCOSEU >=500 (A) 03/31/2019 0310   GLUCOSEU 500 04/28/2012 1149   HGBUR NEGATIVE 03/31/2019 0310   BILIRUBINUR NEGATIVE 03/31/2019 0310   BILIRUBINUR neg 04/28/2012 1053   KETONESUR NEGATIVE 03/31/2019 0310   PROTEINUR 100 (A) 03/31/2019 0310   UROBILINOGEN 4.0 (H) 11/16/2013 1646   NITRITE NEGATIVE 03/31/2019 0310   LEUKOCYTESUR NEGATIVE 03/31/2019 0310   Sepsis Labs: @LABRCNTIP (procalcitonin:4,lacticidven:4)  ) Recent Results (from the past 240 hour(s))  SARS CORONAVIRUS 2 (TAT 6-24 HRS) Nasopharyngeal Nasopharyngeal Swab     Status: None   Collection Time: 04/07/19 11:45 AM   Specimen: Nasopharyngeal Swab  Result Value Ref Range Status   SARS Coronavirus 2 NEGATIVE NEGATIVE Final    Comment: (NOTE) SARS-CoV-2 target nucleic acids are NOT DETECTED. The SARS-CoV-2 RNA is generally detectable in upper and lower respiratory specimens during the acute phase of infection. Negative results do not preclude SARS-CoV-2 infection, do not rule out co-infections with other pathogens, and should not be used as the sole basis for treatment or other patient management decisions. Negative results must be combined with clinical observations,  patient history, and epidemiological information. The expected result is Negative. Fact Sheet for Patients: SugarRoll.be Fact Sheet for Healthcare Providers: https://www.woods-mathews.com/ This test is not yet approved or cleared by the [place] FDA and  has been authorized for detection and/or diagnosis of SARS-CoV-2 by FDA under an Emergency Use Authorization (EUA). This EUA will remain  in effect (meaning this test can be used) for the duration of the COVID-19 declaration under Section 56 4(b)(1) of the Act, 21 U.S.C. section 360bbb-3(b)(1), unless the authorization is  terminated or revoked sooner. Performed at [facility], [place] [address] [place], [place] [address]       Studies: No results found.  Scheduled Meds: . amLODipine  5 mg Oral Daily  . atorvastatin  40 mg Oral q1800  . enoxaparin (LOVENOX) injection  40 mg Subcutaneous Q24H  . ferrous sulfate  325 mg Oral BID WC  . gabapentin  400 mg Oral 3 times per day  . gabapentin  600 mg Oral QHS  . guaiFENesin  10 mL Oral Q6H  . insulin aspart  0-5 Units Subcutaneous QHS  . insulin aspart  0-9 Units Subcutaneous TID WC  . [START ON 04/15/2019] insulin glargine  6 Units Subcutaneous Daily  . lisinopril  2.5 mg Oral Daily  . LORazepam  1 mg Intravenous Once  . neomycin-polymyxin-hydrocortisone  4 drop Right EAR Q8H  . nicotine  21 mg Transdermal Daily  . senna-docusate  2 tablet Oral BID    Continuous Infusions: . sodium chloride Stopped (04/02/19 1000)     LOS: 14 days     Kayleen Memos, MD [name] Pager [phone]  If 7PM-7AM, please contact night-coverage [email or web address] Password [email or web address] 04/14/2019, 12:03 PM

## 2019-04-14 NOTE — Progress Notes (Signed)
Pts. CBG 57, OJ given, will monitor and recheck CBG.

## 2019-04-15 LAB — GLUCOSE, CAPILLARY
Glucose-Capillary: 356 mg/dL — ABNORMAL HIGH (ref 70–99)
Glucose-Capillary: 363 mg/dL — ABNORMAL HIGH (ref 70–99)
Glucose-Capillary: 232 mg/dL — ABNORMAL HIGH (ref 70–99)
Glucose-Capillary: 344 mg/dL — ABNORMAL HIGH (ref 70–99)

## 2019-04-15 MED ORDER — INSULIN ASPART 100 UNIT/ML ~~LOC~~ SOLN
2.0000 [IU] | Freq: Three times a day (TID) | SUBCUTANEOUS | Status: DC
  Administered 2019-04-15: 18:00:00 2 [IU] via SUBCUTANEOUS

## 2019-04-15 NOTE — Progress Notes (Signed)
Patient does not want bed alarm on. Just wants walker beside bed so that he can get to the bathroom. Will assist patient as needed.

## 2019-04-15 NOTE — Progress Notes (Signed)
  Patient would like to speak with a Education officer, museum in the morning.

## 2019-04-15 NOTE — Progress Notes (Signed)
Physical Therapy Treatment Patient Details Name: [name] MRN: [id number] DOB: 04/20/1955 Today's Date: 04/15/2019    History of Present Illness Pt is a 63 y/o male with PMH of DM, HTN, chronic hepatitis C, polysubstance abuse, CKD III, presents with generalized weakness, decreased PO intake, and nausea over the last 3 days. Found with sepsis secondary to community acquired pneumonia.     PT Comments    Continue to progress as tolerated with anticipated d/c to SNF for further skilled PT services.     Follow Up Recommendations  SNF     Equipment Recommendations  Rolling walker with 5" wheels    Recommendations for Other Services       Precautions / Restrictions Precautions Precautions: Fall Restrictions Weight Bearing Restrictions: No    Mobility  Bed Mobility Overal bed mobility: Independent                Transfers Overall transfer level: Needs assistance Equipment used: Rolling walker (2 wheeled) Transfers: Sit to/from Stand Sit to Stand: Min assist;Mod assist         General transfer comment: pt stood X 2 trials from EOB; first trial pt with posterior LOB and required mod A to recover; second trial min A to steady; cues for safe use of AD  Ambulation/Gait Ambulation/Gait assistance: Min guard;Min assist Gait Distance (Feet): 200 Feet Assistive device: Rolling walker (2 wheeled) Gait Pattern/deviations: Step-through pattern;Decreased stride length;Decreased dorsiflexion - right;Decreased dorsiflexion - left Gait velocity: decreased   General Gait Details: assist to steady    Chief Strategy Officer    Modified Rankin (Stroke Patients Only)       Balance Overall balance assessment: Needs assistance Sitting-balance support: Feet supported Sitting balance-Leahy Scale: Good     Standing balance support: During functional activity Standing balance-Leahy Scale: Poor Standing balance comment: pt with posterior bias and  requires min A while donning mask in standing--no UE support                            Cognition Arousal/Alertness: Awake/alert Behavior During Therapy: WFL for tasks assessed/performed;Restless;Impulsive Overall Cognitive Status: No family/caregiver present to determine baseline cognitive functioning Area of Impairment: Safety/judgement;Memory                     Memory: Decreased short-term memory   Safety/Judgement: Decreased awareness of safety;Decreased awareness of deficits            Exercises      General Comments        Pertinent Vitals/Pain Pain Assessment: Faces Faces Pain Scale: No hurt    Home Living                      Prior Function            PT Goals (current goals can now be found in the care plan section) Progress towards PT goals: Progressing toward goals    Frequency    Min 2X/week      PT Plan Current plan remains appropriate    Co-evaluation              AM-PAC PT "6 Clicks" Mobility   Outcome Measure  Help needed turning from your back to your side while in a flat bed without using bedrails?: None Help needed moving from lying on your back to sitting on the  side of a flat bed without using bedrails?: None Help needed moving to and from a bed to a chair (including a wheelchair)?: None Help needed standing up from a chair using your arms (e.g., wheelchair or bedside chair)?: None Help needed to walk in hospital room?: A Little Help needed climbing 3-5 steps with a railing? : A Lot 6 Click Score: 21    End of Session Equipment Utilized During Treatment: Gait belt Activity Tolerance: Patient tolerated treatment well Patient left: in bed;with call bell/phone within reach;Other (comment)(pt sitting EOB ) Nurse Communication: Mobility status;Other (comment)(NT notified pt wants to get a bath) PT Visit Diagnosis: Unsteadiness on feet (R26.81);Muscle weakness (generalized) (M62.81);Difficulty in  walking, not elsewhere classified (R26.2)     Time: 1500-1520 PT Time Calculation (min) (ACUTE ONLY): 20 min  Charges:  $Gait Training: 8-22 mins                     Earney Navy, PTA Acute Rehabilitation Services Pager: [phone] Office: [phone]     Darliss Cheney 04/15/2019, 3:51 PM

## 2019-04-15 NOTE — Progress Notes (Signed)
Inpatient Diabetes Program Recommendations  AACE/ADA: New Consensus Statement on Inpatient Glycemic Control (2015)  Target Ranges:  Prepandial:   less than 140 mg/dL      Peak postprandial:   less than 180 mg/dL (1-2 hours)      Critically ill patients:  140 - 180 mg/dL   Lab Results  Component Value Date   GLUCAP 356 (H) 04/15/2019   HGBA1C 9.1 (H) 04/01/2019    Review of Glycemic Control Results for [name], [name] (MRN [id number]) as of 04/15/2019 08:18  Ref. Range 04/14/2019 11:11 04/14/2019 11:51 04/14/2019 16:28 04/14/2019 21:34 04/15/2019 06:20  Glucose-Capillary Latest Ref Range: 70 - 99 mg/dL 57 (L) 87 232 (H) 244 (H) 356 (H)   Diabetes history: Type 1 DM (requires basal/coverage with meals) Outpatient Diabetes medications: Novolog 70/30 15 units BID Current orders for Inpatient glycemic control: Lantus 6 units QD, Novolog 0-15 units TID, Novolog 0-5 QHS  Inpatient Diabetes Program Recommendations:    Noted hypoglycemia yesterday of 57 mg/dL and subsequent insulin order changes.   Recommend decreasing correction to Novolog 0-6 units TID. Patient is type 1 patient will require meal coverage:  Novolog 2 units TID (Assuming patient is consuming >50% of meal).   Thanks, [name] Curb, MSN, RNC-OB Diabetes Coordinator [phone] (8a-5p)

## 2019-04-15 NOTE — Care Management (Addendum)
Requested RW to be delivered to room for patient to be able to take to shelter if [place] over the holiday weekend. However he already got one from [place] 09/10/2018 so a new one would cost him $99

## 2019-04-15 NOTE — Plan of Care (Signed)
  Problem: Education: Goal: Knowledge of General Education information will improve Description Including pain rating scale, medication(s)/side effects and non-pharmacologic comfort measures Outcome: Adequate for Discharge   Problem: Health Behavior/Discharge Planning: Goal: Ability to manage health-related needs will improve Outcome: Adequate for Discharge   

## 2019-04-15 NOTE — Progress Notes (Addendum)
Patient called out- stated he fell and was on the floor.   NT responded right after call, found patient in bed.   Per patient stated he tripped and fell sideways and hit his head on bedside table.  On assessment- no injuries noted, patient denies dizziness or lightheadedness.   Paged MD- made her aware

## 2019-04-15 NOTE — Progress Notes (Signed)
PROGRESS NOTE  [name] IDP:[id number] DOB: 20-Dec-1955 DOA: 03/30/2019 PCP: Patient, No Pcp Per  HPI/Recap of past 30 hours: 63 year old homeless male with history of IDDM-2, HTN, chronic hepatitis C, polysubstance abuse, and chronic pain presented with generalized weakness, nausea and poor p.o. intake for 3 days and admitted for sepsis due to CAPandright acute otitis media. Completed course of ceftriaxone and azithromycin.  Hospital course complicated by labile blood sugars.  Decreased insulin doses.  Generalized weakness.  PT OT recommended SNF.  CSW assisting with placement, no bed offers.   04/15/19: Seen and examined.  No new complaints this morning.  Questionable fall per bedside RN.  No injuries noted.  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 COVID-19 and RVP negative. No respiratory distress. -Completed ceftriaxone and azithromycin for CAP 12/16-12/21.  Resolved acute otitis media of right ear -Antibiotics completion as above         Labile and uncontrolled type 1 diabetes complicated by with diabetic neuropathy and hypoglycemia-patient reports his blood sugar has been as high as 1500 and as low as 8!!!!  Hemoglobin A1c 9.1 , 04/01/2019.  On gabapentin.  Hypoglycemic on 04/14/2019 morning.  Insulin dose was reduced.    Peripheral neuropathy/?  Diffuse joint pain.   On gabapentin, increased gabapentin dose. On Percocet 1-2 tablets every 6 as needed for severe joint pain Continue bowel regimen to avoid opiate-induced constipation.  Resolved intractable nausea with vomiting likely in the setting of of hypo-glycemia IV Zofran as needed Reduce insulin doses  Essential  hypertension Blood pressure is at goal -Continue amlodipine 5 mg daily -Continue to monitor vital signs.  Chronic normocytic anemia: Hgb 11-13 (baseline)>10.2 (admit)>>9.4.9.1.  Obtain CBC.  History of chronic hepatitis C: HCV RNA undetectable in 12/2016 and 04/2017.  Tobacco use disorder: Reports smoking about a pack a day. -Encourage cessation -Nicotine patch  Alcohol abuse in remission -Congratulated.  Hyponatremia: Resolved.  Obtain BMP.  Hyperlipidemia continue statin.  AKI: Intravascular volume contracted, encourage to increase oral fluid intake.  Ambulatory dysfunction/physical debility PT assessed and recommended SNF CSW assisting with SNF placement. Continue PT OT with assistance and fall precautions.  Patient is homeless-await placement  Questionable fall Per bedside RN [employer] Unwitnessed 04/15/2019.  No apparent injuries .  Estimated body mass index is 21.52 kg/m as calculated from the following:   Height as of this encounter: 6' (1.829 m).   Weight as of this encounter: 72 kg.  DVT prophylaxis:Subcu Lovenox daily.    Code Status:Full code Family Communication: None patient has 2 children who lives in [place] but they are not in touch with him. Disposition Plan:Final disposition SNF.    Consultants:None      Objective: Vitals:   04/15/19 0429 04/15/19 0432 04/15/19 0843 04/15/19 1142  BP: 137/61  (!) 115/55 127/61  Pulse: 77  75 72  Resp: 20   18  Temp: 98 F (36.7 C)   98.2 F (36.8 C)  TempSrc: Oral   Oral  SpO2: 93%  96%   Weight:  71.1 kg    Height:        Intake/Output Summary (Last 24 hours) at 04/15/2019 1440 Last data filed at 04/15/2019 0900 Gross per 24  hour  Intake 600 ml  Output 200 ml  Net 400 ml   Filed Weights   04/13/19 0432 04/14/19 0427 04/15/19 0432  Weight: 73.3 kg 73.2 kg 71.1 kg    Exam:  . General: 63 y.o. year-old male well-developed well-nourished no acute stress.  Alert and  oriented x3. . Cardiovascular: Regular rate and rhythm no rubs or gallops.  [name]  Respiratory: Clear consultation no wheezes or rales. Abdomen: Soft nontender normal bowel sounds present.   Musculoskeletal: No lower extremity edema. Psychiatry: Mood is appropriate for condition and setting.  Data Reviewed: CBC: Recent Labs  Lab 04/12/19 1222  WBC 11.1*  HGB 10.3*  HCT 32.9*  MCV 85.5  PLT 678*   Basic Metabolic Panel: Recent Labs  Lab 04/12/19 1222  NA 139  K 4.8  CL 103  CO2 26  GLUCOSE 127*  BUN 24*  CREATININE 1.53*  CALCIUM 9.5   GFR: Estimated Creatinine Clearance: 50.3 mL/min (A) (by C-G formula based on SCr of 1.53 mg/dL (H)). Liver Function Tests: No results for input(s): AST, ALT, ALKPHOS, BILITOT, PROT, ALBUMIN in the last 168 hours. No results for input(s): LIPASE, AMYLASE in the last 168 hours. No results for input(s): AMMONIA in the last 168 hours. Coagulation Profile: No results for input(s): INR, PROTIME in the last 168 hours. Cardiac Enzymes: No results for input(s): CKTOTAL, CKMB, CKMBINDEX, TROPONINI in the last 168 hours. BNP (last 3 results) No results for input(s): PROBNP in the last 8760 hours. HbA1C: No results for input(s): HGBA1C in the last 72 hours. CBG: Recent Labs  Lab 04/14/19 1151 04/14/19 1628 04/14/19 2134 04/15/19 0620 04/15/19 1125  GLUCAP 87 232* 244* 356* 363*   Lipid Profile: No results for input(s): CHOL, HDL, LDLCALC, TRIG, CHOLHDL, LDLDIRECT in the last 72 hours. Thyroid Function Tests: No results for input(s): TSH, T4TOTAL, FREET4, T3FREE, THYROIDAB in the last 72 hours. Anemia Panel: No results for input(s): VITAMINB12, FOLATE, FERRITIN, TIBC, IRON, RETICCTPCT in the last 72 hours. Urine analysis:    Component Value Date/Time   COLORURINE YELLOW 03/31/2019 0310   APPEARANCEUR CLEAR 03/31/2019 0310   LABSPEC 1.020 03/31/2019 0310   PHURINE 6.0 03/31/2019 0310   GLUCOSEU >=500 (A) 03/31/2019 0310   GLUCOSEU 500  04/28/2012 1149   HGBUR NEGATIVE 03/31/2019 0310   BILIRUBINUR NEGATIVE 03/31/2019 0310   BILIRUBINUR neg 04/28/2012 1053   KETONESUR NEGATIVE 03/31/2019 0310   PROTEINUR 100 (A) 03/31/2019 0310   UROBILINOGEN 4.0 (H) 11/16/2013 1646   NITRITE NEGATIVE 03/31/2019 0310   LEUKOCYTESUR NEGATIVE 03/31/2019 0310   Sepsis Labs: @LABRCNTIP (procalcitonin:4,lacticidven:4)  ) Recent Results (from the past 240 hour(s))  SARS CORONAVIRUS 2 (TAT 6-24 HRS) Nasopharyngeal Nasopharyngeal Swab     Status: None   Collection Time: 04/07/19 11:45 AM   Specimen: Nasopharyngeal Swab  Result Value Ref Range Status   SARS Coronavirus 2 NEGATIVE NEGATIVE Final    Comment: (NOTE) SARS-CoV-2 target nucleic acids are NOT DETECTED. The SARS-CoV-2 RNA is generally detectable in upper and lower respiratory specimens during the acute phase of infection. Negative results do not preclude SARS-CoV-2 infection, do not rule out co-infections with other pathogens, and should not be used as the sole basis for treatment or other patient management decisions. Negative results must be combined with clinical observations, patient history, and epidemiological information. The expected result is Negative. Fact Sheet for Patients: SugarRoll.be Fact Sheet for Healthcare Providers: https://www.woods-mathews.com/ This test is not yet approved or cleared by the [place] FDA and  has been authorized for detection and/or diagnosis of SARS-CoV-2 by FDA under an Emergency Use Authorization (EUA). This EUA will remain  in effect (meaning this test can be used) for the duration of the COVID-19 declaration under Section 56 4(b)(1) of the Act, 21 U.S.C. section 360bbb-3(b)(1), unless the authorization is terminated or revoked sooner. Performed at [facility], [place] [address], [place], [place] [address]       Studies: No results found.  Scheduled Meds: . amLODipine  5 mg  Oral Daily  . atorvastatin  40 mg Oral q1800  . enoxaparin (LOVENOX) injection  40 mg Subcutaneous Q24H  . ferrous sulfate  325 mg Oral BID WC  . gabapentin  400 mg Oral 3 times per day  . gabapentin  600 mg Oral QHS  . guaiFENesin  10 mL Oral Q6H  . insulin aspart  0-5 Units Subcutaneous QHS  . insulin aspart  0-9 Units Subcutaneous TID WC  . insulin aspart  2 Units Subcutaneous TID WC  . insulin glargine  6 Units Subcutaneous Daily  . lisinopril  2.5 mg Oral Daily  . LORazepam  1 mg Intravenous Once  . neomycin-polymyxin-hydrocortisone  4 drop Right EAR Q8H  . nicotine  21 mg Transdermal Daily  . senna-docusate  2 tablet Oral BID    Continuous Infusions: . sodium chloride Stopped (04/02/19 1000)     LOS: 15 days     Kayleen Memos, MD [name] Pager [phone]  If 7PM-7AM, please contact night-coverage [email or web address] Password [email or web address] 04/15/2019, 2:40 PM

## 2019-04-16 LAB — GLUCOSE, CAPILLARY
Glucose-Capillary: 402 mg/dL — ABNORMAL HIGH (ref 70–99)
Glucose-Capillary: 434 mg/dL — ABNORMAL HIGH (ref 70–99)
Glucose-Capillary: 366 mg/dL — ABNORMAL HIGH (ref 70–99)
Glucose-Capillary: 281 mg/dL — ABNORMAL HIGH (ref 70–99)
Glucose-Capillary: 334 mg/dL — ABNORMAL HIGH (ref 70–99)

## 2019-04-16 MED ORDER — INSULIN ASPART 100 UNIT/ML ~~LOC~~ SOLN
3.0000 [IU] | Freq: Three times a day (TID) | SUBCUTANEOUS | Status: DC
  Administered 2019-04-16 – 2019-04-23 (×15): 3 [IU] via SUBCUTANEOUS

## 2019-04-16 MED ORDER — INSULIN GLARGINE 100 UNIT/ML ~~LOC~~ SOLN
3.0000 [IU] | Freq: Two times a day (BID) | SUBCUTANEOUS | Status: DC
  Administered 2019-04-16 (×2): 3 [IU] via SUBCUTANEOUS
  Filled 2019-04-16 (×4): qty 0.03

## 2019-04-16 MED ORDER — INSULIN ASPART 100 UNIT/ML ~~LOC~~ SOLN
11.0000 [IU] | Freq: Once | SUBCUTANEOUS | Status: AC
  Administered 2019-04-16: 07:00:00 11 [IU] via SUBCUTANEOUS

## 2019-04-16 NOTE — Progress Notes (Signed)
CBG 402, provider notified.

## 2019-04-16 NOTE — Progress Notes (Signed)
Patient says his Percocet makes him hallucinate and he saw his dead wife and her dead brother in his room last night, but patient says he depends on the percocet because it really helps him.

## 2019-04-16 NOTE — Progress Notes (Signed)
Patient removed IV, says he doesn't want another one right now.

## 2019-04-16 NOTE — Plan of Care (Signed)
  Problem: Education: Goal: Knowledge of General Education information will improve Description Including pain rating scale, medication(s)/side effects and non-pharmacologic comfort measures Outcome: Adequate for Discharge   Problem: Health Behavior/Discharge Planning: Goal: Ability to manage health-related needs will improve Outcome: Adequate for Discharge   

## 2019-04-16 NOTE — Progress Notes (Signed)
Patient does not want bed alarm on, will assess for safety needs.

## 2019-04-16 NOTE — Progress Notes (Signed)
Patient tried to hide percocet under the covers. RN saw patient and pulled the covers back and told the patient to take his pills now while RN is present.

## 2019-04-16 NOTE — Progress Notes (Signed)
PROGRESS NOTE  [name] PJA:[id number] DOB: Nov 30, 1955 DOA: 03/30/2019 PCP: Patient, No Pcp Per  HPI/Recap of past 24 hours: 64 year old homeless male with history of IDDM-1, HTN, chronic hepatitis C, polysubstance abuse, and chronic pain presented with generalized weakness, nausea and poor p.o. intake for 3 days and admitted for sepsis due to CAPandright acute otitis media. Completed course of ceftriaxone and azithromycin.  Hospital course complicated by labile blood sugars.  Decreased insulin doses.  Diabetes coordinator also following.  Generalized weakness.  PT OT recommended SNF.  CSW assisting with placement, no bed offers.  04/16/19: Seen and examined.  No new complaints at the time of this visit.  Blood sugars have been labile.  Adjusting doses.   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 COVID-19 and RVP negative. No respiratory distress. -Completed ceftriaxone and azithromycin for CAP 12/16-12/21/20.  Resolved acute otitis media of right ear -Antibiotics completion as above         Labile and uncontrolled type 1 diabetes complicated by with diabetic neuropathy and hypoglycemia-patient reports his blood sugar has been as high as 1500 and as low as 8!!!!  Hemoglobin A1c 9.1 , 04/01/2019.  On gabapentin for diabetic neuropathy.  Hypoglycemic on 04/14/2019 morning.  Insulin dose was reduced.  Now hyperglycemic.  We are adjusting insulin doses.  Diabetic coordinator also following.  Peripheral neuropathy/ Diffuse joint pain.   On gabapentin, increased gabapentin dose. On Percocet 1-2 tablets every 6 as needed for severe joint pain Continue bowel regimen to avoid opiate-induced  constipation.  Resolved intractable nausea with vomiting likely in the setting of of hypo-glycemia IV Zofran as needed Reduce insulin doses  Essential hypertension Blood pressure is at goal -Continue amlodipine 5 mg daily -Continue to monitor vital signs.  Chronic normocytic anemia: Hgb 11-13 (baseline)>10.2 (admit)>>9.4.9.1.  Obtain CBC.  History of chronic hepatitis C: HCV RNA undetectable in 12/2016 and 04/2017.  Tobacco use disorder: Reports smoking about a pack a day. -Encourage cessation -Nicotine patch  Alcohol abuse in remission -Congratulated.  Hyponatremia: Resolved.  Obtain BMP.  Hyperlipidemia continue statin.  AKI: Intravascular volume contracted, encourage to increase oral fluid intake.  Ambulatory dysfunction/physical debility PT assessed and recommended SNF CSW assisting with SNF placement. Continue PT OT with assistance and fall precautions.  Patient is homeless-await placement  Questionable fall Per bedside RN [employer] Unwitnessed 04/15/2019.  No apparent injuries .   DVT prophylaxis:Subcu Lovenox daily.    Code Status:Full code Family Communication: None patient has 2 children who lives in [place] but they are not in touch with him. Disposition Plan:Final disposition SNF.    Consultants:None      Objective: Vitals:   04/16/19 0429 04/16/19 0434 04/16/19 0918 04/16/19 1206  BP: 132/66  (!) 165/64 (!) 119/56  Pulse: 70  76 70  Resp: 18   20  Temp: 98.1 F (36.7 C)   98.2 F (36.8 C)  TempSrc: Oral   Oral  SpO2: 93%  98% 96%  Weight:  71.4 kg    Height:        Intake/Output Summary (Last 24 hours) at 04/16/2019 1237 Last data filed at 04/16/2019 0404 Gross per 24 hour  Intake 1174 ml  Output --  Net 1174 ml   Filed Weights   04/14/19 0427 04/15/19 0432 04/16/19 0434  Weight: 73.2 kg 71.1 kg 71.4 kg    Exam:  . General: 64 y.o. year-old male well-developed well-nourished in no acute distress.  Alert  and oriented x3.   . Cardiovascular: Regular rate and rhythm no rubs or gallops.   Respiratory: Clear to auscultation no wheezes or rales.   Abdomen: Soft nontender normal bowel sounds present  musculoskeletal: No lower extremity edema bilaterally.   Psychiatry: Mood is appropriate for condition and setting.  Data Reviewed: CBC: Recent Labs  Lab 04/12/19 1222  WBC 11.1*  HGB 10.3*  HCT 32.9*  MCV 85.5  PLT 782*   Basic Metabolic Panel: Recent Labs  Lab 04/12/19 1222  NA 139  K 4.8  CL 103  CO2 26  GLUCOSE 127*  BUN 24*  CREATININE 1.53*  CALCIUM 9.5   GFR: Estimated Creatinine Clearance: 50.6 mL/min (A) (by C-G formula based on SCr of 1.53 mg/dL (H)). Liver Function Tests: No results for input(s): AST, ALT, ALKPHOS, BILITOT, PROT, ALBUMIN in the last 168 hours. No results for input(s): LIPASE, AMYLASE in the last 168 hours. No results for input(s): AMMONIA in the last 168 hours. Coagulation Profile: No results for input(s): INR, PROTIME in the last 168 hours. Cardiac Enzymes: No results for input(s): CKTOTAL, CKMB, CKMBINDEX, TROPONINI in the last 168 hours. BNP (last 3 results) No results for input(s): PROBNP in the last 8760 hours. HbA1C: No results for input(s): HGBA1C in the last 72 hours. CBG: Recent Labs  Lab 04/15/19 1609 04/15/19 2131 04/16/19 0616 04/16/19 0639 04/16/19 1203  GLUCAP 232* 344* 402* 434* 366*   Lipid Profile: No results for input(s): CHOL, HDL, LDLCALC, TRIG, CHOLHDL, LDLDIRECT in the last 72 hours. Thyroid Function Tests: No results for input(s): TSH, T4TOTAL, FREET4, T3FREE, THYROIDAB in the last 72 hours. Anemia Panel: No results for input(s): VITAMINB12, FOLATE, FERRITIN, TIBC, IRON, RETICCTPCT in the last 72 hours. Urine analysis:    Component Value Date/Time   COLORURINE YELLOW 03/31/2019 0310   APPEARANCEUR CLEAR 03/31/2019 0310   LABSPEC 1.020 03/31/2019 0310   PHURINE 6.0 03/31/2019 0310   GLUCOSEU >=500 (A)  03/31/2019 0310   GLUCOSEU 500 04/28/2012 1149   HGBUR NEGATIVE 03/31/2019 0310   BILIRUBINUR NEGATIVE 03/31/2019 0310   BILIRUBINUR neg 04/28/2012 1053   KETONESUR NEGATIVE 03/31/2019 0310   PROTEINUR 100 (A) 03/31/2019 0310   UROBILINOGEN 4.0 (H) 11/16/2013 1646   NITRITE NEGATIVE 03/31/2019 0310   LEUKOCYTESUR NEGATIVE 03/31/2019 0310   Sepsis Labs: @LABRCNTIP (procalcitonin:4,lacticidven:4)  ) Recent Results (from the past 240 hour(s))  SARS CORONAVIRUS 2 (TAT 6-24 HRS) Nasopharyngeal Nasopharyngeal Swab     Status: None   Collection Time: 04/07/19 11:45 AM   Specimen: Nasopharyngeal Swab  Result Value Ref Range Status   SARS Coronavirus 2 NEGATIVE NEGATIVE Final    Comment: (NOTE) SARS-CoV-2 target nucleic acids are NOT DETECTED. The SARS-CoV-2 RNA is generally detectable in upper and lower respiratory specimens during the acute phase of infection. Negative results do not preclude SARS-CoV-2 infection, do not rule out co-infections with other pathogens, and should not be used as the sole basis for treatment or other patient management decisions. Negative results must be combined with clinical observations, patient history, and epidemiological information. The expected result is Negative. Fact Sheet for Patients: SugarRoll.be Fact Sheet for Healthcare Providers: https://www.woods-mathews.com/ This test is not yet approved or cleared by the [place] and  has been authorized  for detection and/or diagnosis of SARS-CoV-2 by FDA under an Emergency Use Authorization (EUA). This EUA will remain  in effect (meaning this test can be used) for the duration of the COVID-19 declaration under Section 56 4(b)(1) of the Act, 21 U.S.C. section 360bbb-3(b)(1), unless the authorization is terminated or revoked sooner. Performed at [facility], [place] [address], [place], [place] [address]       Studies: No results  found.  Scheduled Meds: . amLODipine  5 mg Oral Daily  . atorvastatin  40 mg Oral q1800  . enoxaparin (LOVENOX) injection  40 mg Subcutaneous Q24H  . ferrous sulfate  325 mg Oral BID WC  . gabapentin  400 mg Oral 3 times per day  . gabapentin  600 mg Oral QHS  . guaiFENesin  10 mL Oral Q6H  . insulin aspart  0-5 Units Subcutaneous QHS  . insulin aspart  0-9 Units Subcutaneous TID WC  . insulin aspart  3 Units Subcutaneous TID WC  . insulin glargine  3 Units Subcutaneous BID  . lisinopril  2.5 mg Oral Daily  . LORazepam  1 mg Intravenous Once  . neomycin-polymyxin-hydrocortisone  4 drop Right EAR Q8H  . nicotine  21 mg Transdermal Daily  . senna-docusate  2 tablet Oral BID    Continuous Infusions: . sodium chloride Stopped (04/02/19 1000)     LOS: 16 days     Kayleen Memos, MD [name] Pager [phone]  If 7PM-7AM, please contact night-coverage [email or web address] Password [facility] 04/16/2019, 12:37 PM

## 2019-04-17 LAB — CBC WITH DIFFERENTIAL/PLATELET
Abs Immature Granulocytes: 0.03 10*3/uL (ref 0.00–0.07)
Basophils Absolute: 0.1 10*3/uL (ref 0.0–0.1)
Basophils Relative: 1 %
Eosinophils Absolute: 0.4 10*3/uL (ref 0.0–0.5)
Eosinophils Relative: 5 %
HCT: 29.6 % — ABNORMAL LOW (ref 39.0–52.0)
Hemoglobin: 9.4 g/dL — ABNORMAL LOW (ref 13.0–17.0)
Immature Granulocytes: 0 %
Lymphocytes Relative: 22 %
Lymphs Abs: 1.7 10*3/uL (ref 0.7–4.0)
MCH: 27.3 pg (ref 26.0–34.0)
MCHC: 31.8 g/dL (ref 30.0–36.0)
MCV: 86 fL (ref 80.0–100.0)
Monocytes Absolute: 0.6 10*3/uL (ref 0.1–1.0)
Monocytes Relative: 7 %
Neutro Abs: 4.8 10*3/uL (ref 1.7–7.7)
Neutrophils Relative %: 65 %
Platelets: 428 10*3/uL — ABNORMAL HIGH (ref 150–400)
RBC: 3.44 MIL/uL — ABNORMAL LOW (ref 4.22–5.81)
RDW: 16.6 % — ABNORMAL HIGH (ref 11.5–15.5)
WBC: 7.5 10*3/uL (ref 4.0–10.5)
nRBC: 0 % (ref 0.0–0.2)

## 2019-04-17 LAB — GLUCOSE, CAPILLARY
Glucose-Capillary: 316 mg/dL — ABNORMAL HIGH (ref 70–99)
Glucose-Capillary: 233 mg/dL — ABNORMAL HIGH (ref 70–99)
Glucose-Capillary: 54 mg/dL — ABNORMAL LOW (ref 70–99)
Glucose-Capillary: 127 mg/dL — ABNORMAL HIGH (ref 70–99)
Glucose-Capillary: 400 mg/dL — ABNORMAL HIGH (ref 70–99)

## 2019-04-17 MED ORDER — INSULIN GLARGINE 100 UNIT/ML ~~LOC~~ SOLN
7.0000 [IU] | Freq: Two times a day (BID) | SUBCUTANEOUS | Status: DC
  Administered 2019-04-17 – 2019-04-23 (×13): 7 [IU] via SUBCUTANEOUS
  Filled 2019-04-17 (×16): qty 0.07

## 2019-04-17 MED ORDER — ALUM & MAG HYDROXIDE-SIMETH 200-200-20 MG/5ML PO SUSP
15.0000 mL | ORAL | Status: DC | PRN
  Administered 2019-04-17 – 2019-04-21 (×6): 15 mL via ORAL
  Filled 2019-04-17 (×7): qty 30

## 2019-04-17 NOTE — Progress Notes (Signed)
Hypoglycemic Event  CBG: 54  Treatment: Cheerios and milk. Refused OJ  Symptoms: Asymptomatic  Follow-up CBG: Time:1734 CBG Result:127  Comments/MD notified: Yes    Earna Coder

## 2019-04-17 NOTE — Progress Notes (Signed)
Refused to take afternoon meds. Educated pt.  MD aware. Will try again later.

## 2019-04-17 NOTE — Progress Notes (Signed)
PROGRESS NOTE  [name] UEA:[id number] DOB: 06/17/1955 DOA: 03/30/2019 PCP: Patient, No Pcp Per  Brief History   64 year old homeless male with history of IDDM-1, HTN, chronic hepatitis C, polysubstance abuse, and chronic pain presented with generalized weakness, nausea and poor p.o. intake for 3 days and admitted for sepsis due to CAPandright acute otitis media. Completed course of ceftriaxone and azithromycin.  Hospital course complicated by labile blood sugars.  Decreased insulin doses.  Diabetes coordinator also following.  Generalized weakness.  PT OT recommended SNF.  CSW assisting with placement. Pt is awaiting placement. He has refused maintenanc of IV access and use of bed alarm.  Labile blood sugars and lack of options for placement have caused for discharge to be unsafe at this time.   Consultants  . None  Procedures  . None  Antibiotics   Anti-infectives (From admission, onward)   Start     Dose/Rate Route Frequency Ordered Stop   04/01/19 0900  azithromycin (ZITHROMAX) 500 mg in sodium chloride 0.9 % 250 mL IVPB     500 mg 250 mL/hr over 60 Minutes Intravenous Every 24 hours 03/31/19 0822 04/05/19 0810   04/01/19 0800  cefTRIAXone (ROCEPHIN) 2 g in sodium chloride 0.9 % 100 mL IVPB     2 g 200 mL/hr over 30 Minutes Intravenous Every 24 hours 03/31/19 0822 04/05/19 0811   03/31/19 0615  cefTRIAXone (ROCEPHIN) 1 g in sodium chloride 0.9 % 100 mL IVPB     1 g 200 mL/hr over 30 Minutes Intravenous  Once 03/31/19 0610 03/31/19 0935   03/31/19 0615  azithromycin (ZITHROMAX) 500 mg in sodium chloride 0.9 % 250 mL IVPB     500 mg 250 mL/hr over 60 Minutes Intravenous  Once 03/31/19 0610 03/31/19 1211   03/31/19 0245  doxycycline (VIBRA-TABS) tablet 100 mg     100 mg Oral  Once 03/31/19 0232 03/31/19 0408    .   Subjective  The patient continues to complain of nausea. His blood sugars have been very high (high 200''s - low 400's) in the last 24 hours. Last recorded  hypoglycemia was 57 on 04/14/2019.  Objective   Vitals:  Vitals:   04/17/19 0430 04/17/19 1203  BP: (!) 123/50 (!) 123/59  Pulse: 72 68  Resp: 18 20  Temp: 98.3 F (36.8 C) 98.2 F (36.8 C)  SpO2: 94% 94%   Exam:  Constitutional:  . The patient is awake, alert, and oriented x 3. No acute distress. Respiratory:  . No increased work of breathing. . No wheezes, rales, or rhonchi . No tactile fremitus Cardiovascular:  . Regular rate and rhythm . No murmurs, ectopy, or gallups. . No lateral PMI. No thrills. Abdomen:  . Abdomen is soft, non-tender, non-distended . No hernias, masses, or organomegaly . Normoactive bowel sounds.  Musculoskeletal:  . No cyanosis, clubbing, or edema Skin:  . No rashes, lesions, ulcers . palpation of skin: no induration or nodules Neurologic:  . CN 2-12 intact . Sensation all 4 extremities intact Psychiatric:  . Mental status o Mood, affect appropriate o Orientation to person, place, time  . judgment and insight appear intact  I have personally reviewed the following:   Today's Data  . Vitals, Glucoses, CBC  Scheduled Meds: . amLODipine  5 mg Oral Daily  . atorvastatin  40 mg Oral q1800  . enoxaparin (LOVENOX) injection  40 mg Subcutaneous Q24H  . ferrous sulfate  325 mg Oral BID WC  . gabapentin  400 mg  Oral 3 times per day  . gabapentin  600 mg Oral QHS  . guaiFENesin  10 mL Oral Q6H  . insulin aspart  0-5 Units Subcutaneous QHS  . insulin aspart  0-9 Units Subcutaneous TID WC  . insulin aspart  3 Units Subcutaneous TID WC  . insulin glargine  7 Units Subcutaneous BID  . lisinopril  2.5 mg Oral Daily  . LORazepam  1 mg Intravenous Once  . neomycin-polymyxin-hydrocortisone  4 drop Right EAR Q8H  . nicotine  21 mg Transdermal Daily  . senna-docusate  2 tablet Oral BID   Continuous Infusions: . 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 Due to Pneumonia. The patient has completed a course of IV Ceftriaxone and Azithromycin for this. COVID-19 and RVP are negative. He is now saturating 94% on room air.  Labile and uncontrolled type 1 diabetes complicated by with diabetic neuropathy and hypoglycemia: The patient reports his blood sugar has been as high as 1500 and as low as 8!!!!  Hemoglobin A1c 9.1 , 04/01/2019.  On gabapentin for diabetic neuropathy.  Hypoglycemic on 04/14/2019 morning at 57.  Insulin dose was reduced.  Now hyperglycemic with glucoses running from the high 200's to low 400's.  I have increased Lantus dose. Diabetic coordinator also following.  Peripheral neuropathy/ Diffuse joint pain. On gabapentin, increased gabapentin dose. On Percocet 1-2 tablets every 6 as needed for severe joint pain. Nursing has reported the patient hiding percocet's in his sheets. Continue bowel regimen to avoid opiate-induced constipation.  Resolved intractable nausea with vomiting likely in the setting of of hypo-glycemia: IV Zofran as needed. Monitor glucoses.   Essential hypertension: Blood pressures under fair control on amlodipine and lisinopril. Monitor.  Chronic normocytic anemia: Hgb 11-13 (baseline)>10.2 (admit)>>9.4.   History of chronic hepatitis C: HCV RNA undetectable in 12/2016 and 04/2017  Tobacco use disorder: Reports smoking about a pack a day. The patient has had 7-10 minutes of tobacco cessation counseling. Nicotine patch has been offered.  Alcohol abuse in remission: Noted  Hyponatremia: Resolved. Monitor.  Hyperlipidemia continue statin.  AKI: Intravascular volume contracted, encourage to increase oral fluid intake.  Ambulatory dysfunction/physical debility: PT assessed and  recommended SNF. Pt not safe for discharge per PT/OT due to instability on feet and high risk of falls. CSW assisting with SNF placement. Awaiting SNF placement. Continue PT OT with assistance and fall precautions. Patient is homeless.  Questionable fall: Per bedside RN Arlina Robes. Unwitnessed 04/15/2019.  No apparent injuries.  I have seen and examined this patient myself. I have spent 32 minutes in his evaluation and care.  DVT prophylaxis:Lovenox    Code Status:Full code Family Communication:None available. 2 estranged children in the [place] area. Disposition Plan:Final disposition SNF.  Thos Matsumoto, DO [name] Direct contact: see [email or web address]  7PM-7AM contact night coverage as above 04/17/2019, 3:08 PM  LOS: 17 days

## 2019-04-18 LAB — COMPREHENSIVE METABOLIC PANEL
ALT: 22 U/L (ref 0–44)
AST: 16 U/L (ref 15–41)
Albumin: 2.4 g/dL — ABNORMAL LOW (ref 3.5–5.0)
Alkaline Phosphatase: 106 U/L (ref 38–126)
Anion gap: 10 (ref 5–15)
BUN: 25 mg/dL — ABNORMAL HIGH (ref 8–23)
CO2: 23 mmol/L (ref 22–32)
Calcium: 9.2 mg/dL (ref 8.9–10.3)
Chloride: 101 mmol/L (ref 98–111)
Creatinine, Ser: 1.4 mg/dL — ABNORMAL HIGH (ref 0.61–1.24)
GFR calc Af Amer: >60 mL/min (ref >60)
GFR calc non Af Amer: 53 mL/min — ABNORMAL LOW (ref >60)
Glucose, Bld: 388 mg/dL — ABNORMAL HIGH (ref 70–99)
Potassium: 5.6 mmol/L — ABNORMAL HIGH (ref 3.5–5.1)
Sodium: 134 mmol/L — ABNORMAL LOW (ref 135–145)
Total Bilirubin: 0.5 mg/dL (ref 0.3–1.2)
Total Protein: 6.2 g/dL — ABNORMAL LOW (ref 6.5–8.1)

## 2019-04-18 LAB — GLUCOSE, CAPILLARY
Glucose-Capillary: 356 mg/dL — ABNORMAL HIGH (ref 70–99)
Glucose-Capillary: 200 mg/dL — ABNORMAL HIGH (ref 70–99)
Glucose-Capillary: 196 mg/dL — ABNORMAL HIGH (ref 70–99)
Glucose-Capillary: 113 mg/dL — ABNORMAL HIGH (ref 70–99)
Glucose-Capillary: 132 mg/dL — ABNORMAL HIGH (ref 70–99)
Glucose-Capillary: 237 mg/dL — ABNORMAL HIGH (ref 70–99)

## 2019-04-18 NOTE — Progress Notes (Signed)
PROGRESS NOTE  [name] IHK:[id number] DOB: [birth year] DOA: 03/30/2019 PCP: Patient, No Pcp Per  Brief History   64 year old homeless male with history of IDDM-1, HTN, chronic hepatitis C, polysubstance abuse, and chronic pain presented with generalized weakness, nausea and poor p.o. intake for 3 days and admitted for sepsis due to CAPandright acute otitis media. Completed course of ceftriaxone and azithromycin.  Hospital course complicated by labile blood sugars.  Decreased insulin doses.  Diabetes coordinator also following.  Generalized weakness.  PT OT recommended SNF.  CSW assisting with placement. Pt is awaiting placement. He has refused maintenanc of IV access and use of bed alarm.  Labile blood sugars, debility, ambulatory dysfunction, and lack of options for placement have caused for discharge to be unsafe at this time. Awaiting SNF placement.  Consultants  . None  Procedures  . None  Antibiotics   Anti-infectives (From admission, onward)   Start     Dose/Rate Route Frequency Ordered Stop   04/01/19 0900  azithromycin (ZITHROMAX) 500 mg in sodium chloride 0.9 % 250 mL IVPB     500 mg 250 mL/hr over 60 Minutes Intravenous Every 24 hours 03/31/19 0822 04/05/19 0810   04/01/19 0800  cefTRIAXone (ROCEPHIN) 2 g in sodium chloride 0.9 % 100 mL IVPB     2 g 200 mL/hr over 30 Minutes Intravenous Every 24 hours 03/31/19 0822 04/05/19 0811   03/31/19 0615  cefTRIAXone (ROCEPHIN) 1 g in sodium chloride 0.9 % 100 mL IVPB     1 g 200 mL/hr over 30 Minutes Intravenous  Once 03/31/19 0610 03/31/19 0935   03/31/19 0615  azithromycin (ZITHROMAX) 500 mg in sodium chloride 0.9 % 250 mL IVPB     500 mg 250 mL/hr over 60 Minutes Intravenous  Once 03/31/19 0610 03/31/19 1211   03/31/19 0245  doxycycline (VIBRA-TABS) tablet 100 mg     100 mg Oral  Once 03/31/19 0232 03/31/19 0408      Subjective  The patient continues to complain of nausea. His blood sugars have been very high (high  200''s - low 400's) in the last 24 hours. Last recorded hypoglycemia was 54 on 04/16/2018.  Objective   Vitals:  Vitals:   04/18/19 1051 04/18/19 1157  BP: 125/65 (!) 148/69  Pulse:  69  Resp:  18  Temp:  98.4 F (36.9 C)  SpO2:  98%   Exam:  Constitutional:  . The patient is awake, alert, and oriented x 3. No acute distress. Respiratory:  . No increased work of breathing. . No wheezes, rales, or rhonchi . No tactile fremitus Cardiovascular:  . Regular rate and rhythm . No murmurs, ectopy, or gallups. . No lateral PMI. No thrills. Abdomen:  . Abdomen is soft, non-tender, non-distended . No hernias, masses, or organomegaly . Normoactive bowel sounds.  Musculoskeletal:  . No cyanosis, clubbing, or edema Skin:  . No rashes, lesions, ulcers . palpation of skin: no induration or nodules Neurologic:  . CN 2-12 intact . Sensation all 4 extremities intact Psychiatric:  . Mental status o Mood, affect appropriate o Orientation to person, place, time  . judgment and insight appear intact  I have personally reviewed the following:   Today's Data  . Vitals, Glucoses, CBC  Scheduled Meds: . amLODipine  5 mg Oral Daily  . atorvastatin  40 mg Oral q1800  . enoxaparin (LOVENOX) injection  40 mg Subcutaneous Q24H  . ferrous sulfate  325 mg Oral BID WC  . gabapentin  400 mg  Oral 3 times per day  . gabapentin  600 mg Oral QHS  . guaiFENesin  10 mL Oral Q6H  . insulin aspart  0-5 Units Subcutaneous QHS  . insulin aspart  0-9 Units Subcutaneous TID WC  . insulin aspart  3 Units Subcutaneous TID WC  . insulin glargine  7 Units Subcutaneous BID  . lisinopril  2.5 mg Oral Daily  . LORazepam  1 mg Intravenous Once  . neomycin-polymyxin-hydrocortisone  4 drop Right EAR Q8H  . nicotine  21 mg Transdermal Daily  . senna-docusate  2 tablet Oral BID   Continuous Infusions: . sodium chloride Stopped (04/02/19 1000)    Principal Problem:   Sepsis due to pneumonia [facility]) Active  Problems:   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 ([name]   Tobacco use   Hyponatremia   CKD (chronic kidney disease), stage III   Normocytic anemia   Otitis media   Homelessness   LOS: 18 days   A & P  Sepsis: Resolved. Due to Pneumonia. The patient has completed a course of IV Ceftriaxone and Azithromycin for this. COVID-19 and RVP are negative. He is now saturating 94% on room air.  Labile and uncontrolled type 1 diabetes complicated by with diabetic neuropathy and hypoglycemia: The patient reports his blood sugar has been as high as 1500 and as low as 8!!!!  Hemoglobin A1c 9.1 , 04/01/2019.  On gabapentin for diabetic neuropathy. Hypoglycemic on 04/16/2018 afternoon at 54.   Now hyperglycemic with glucoses running from the high 200's to low 400's. Diabetic coordinator also following.  Peripheral neuropathy/ Diffuse joint pain. On gabapentin, increased gabapentin dose. On Percocet 1-2 tablets every 6 as needed for severe joint pain. Nursing has reported the patient hiding percocet's in his sheets. Continue bowel regimen to avoid opiate-induced constipation.  Resolved intractable nausea with vomiting likely in the setting of of hypo-glycemia: IV Zofran as needed. Monitor glucoses.   Essential hypertension: Blood pressures under fair control on amlodipine and lisinopril. Monitor.  Chronic normocytic anemia: Hgb 11-13 (baseline)>10.2 (admit)>>9.4.   History of chronic hepatitis C: HCV RNA undetectable in 12/2016 and 04/2017  Tobacco use disorder: Reports smoking about a pack a day. The patient has had 7-10 minutes of tobacco cessation counseling. Nicotine patch has been offered.  Alcohol abuse in remission: Noted  Hyponatremia: Resolved. Monitor.  Hyperlipidemia continue statin.  AKI: Intravascular volume contracted, encourage to increase oral fluid intake.  Ambulatory dysfunction/physical debility: PT assessed and recommended SNF. Pt not safe for  discharge per PT/OT due to instability on feet and high risk of falls. CSW assisting with SNF placement. Awaiting SNF placement. Continue PT OT with assistance and fall precautions. Patient is homeless.  Questionable fall: Per bedside RN Arlina Robes. Unwitnessed 04/15/2019.  No apparent injuries.  I have seen and examined this patient myself. I have spent 30 minutes in his evaluation and care.  DVT prophylaxis:Lovenox    Code Status:Full code Family Communication:None available. 2 estranged children in the [place] area. Disposition Plan:Final disposition SNF.No safe discharge at this time.   Demetris Capell, DO [name] Direct contact: see [email or web address]  7PM-7AM contact night coverage as above 04/17/2019, 3:08 PM  LOS: 17 days

## 2019-04-19 LAB — BASIC METABOLIC PANEL
Anion gap: 10 (ref 5–15)
BUN: 24 mg/dL — ABNORMAL HIGH (ref 8–23)
CO2: 21 mmol/L — ABNORMAL LOW (ref 22–32)
Calcium: 9.2 mg/dL (ref 8.9–10.3)
Chloride: 105 mmol/L (ref 98–111)
Creatinine, Ser: 1.29 mg/dL — ABNORMAL HIGH (ref 0.61–1.24)
GFR calc Af Amer: >60 mL/min (ref >60)
GFR calc non Af Amer: 59 mL/min — ABNORMAL LOW (ref >60)
Glucose, Bld: 202 mg/dL — ABNORMAL HIGH (ref 70–99)
Potassium: 4.8 mmol/L (ref 3.5–5.1)
Sodium: 136 mmol/L (ref 135–145)

## 2019-04-19 LAB — CBC WITH DIFFERENTIAL/PLATELET
Abs Immature Granulocytes: 0.02 10*3/uL (ref 0.00–0.07)
Basophils Absolute: 0.1 10*3/uL (ref 0.0–0.1)
Basophils Relative: 2 %
Eosinophils Absolute: 0.4 10*3/uL (ref 0.0–0.5)
Eosinophils Relative: 6 %
HCT: 32.6 % — ABNORMAL LOW (ref 39.0–52.0)
Hemoglobin: 10.3 g/dL — ABNORMAL LOW (ref 13.0–17.0)
Immature Granulocytes: 0 %
Lymphocytes Relative: 29 %
Lymphs Abs: 2 10*3/uL (ref 0.7–4.0)
MCH: 27 pg (ref 26.0–34.0)
MCHC: 31.6 g/dL (ref 30.0–36.0)
MCV: 85.6 fL (ref 80.0–100.0)
Monocytes Absolute: 0.5 10*3/uL (ref 0.1–1.0)
Monocytes Relative: 7 %
Neutro Abs: 3.9 10*3/uL (ref 1.7–7.7)
Neutrophils Relative %: 56 %
Platelets: 425 10*3/uL — ABNORMAL HIGH (ref 150–400)
RBC: 3.81 MIL/uL — ABNORMAL LOW (ref 4.22–5.81)
RDW: 16.8 % — ABNORMAL HIGH (ref 11.5–15.5)
WBC: 6.9 10*3/uL (ref 4.0–10.5)
nRBC: 0 % (ref 0.0–0.2)

## 2019-04-19 LAB — GLUCOSE, CAPILLARY
Glucose-Capillary: 185 mg/dL — ABNORMAL HIGH (ref 70–99)
Glucose-Capillary: 146 mg/dL — ABNORMAL HIGH (ref 70–99)
Glucose-Capillary: 108 mg/dL — ABNORMAL HIGH (ref 70–99)

## 2019-04-19 NOTE — Care Management Important Message (Signed)
Important Message  Patient Details  Name: [name] MRN: [id number] Date of Birth: 1956/01/29   Medicare Important Message Given:  Yes     [name] 04/19/2019, 1:10 PM

## 2019-04-19 NOTE — Progress Notes (Signed)
PROGRESS NOTE  [name] LMB:[id number] DOB: 12-15-1955 DOA: 03/30/2019 PCP: Patient, No Pcp Per  Brief History   64 year old homeless male with history of IDDM-1, HTN, chronic hepatitis C, polysubstance abuse, and chronic pain presented with generalized weakness, nausea and poor p.o. intake for 3 days and admitted for sepsis due to CAPandright acute otitis media. Completed course of ceftriaxone and azithromycin.  Hospital course complicated by labile blood sugars.  Decreased insulin doses.  Diabetes coordinator also following.  Generalized weakness.  PT OT recommended SNF.  CSW assisting with placement. Pt is awaiting placement. He has refused maintenanc of IV access and use of bed alarm.  Labile blood sugars, debility, ambulatory dysfunction, and lack of options for placement have caused for discharge to be unsafe at this time. Awaiting SNF placement.  Consultants  . None  Procedures  . None  Antibiotics   Anti-infectives (From admission, onward)   Start     Dose/Rate Route Frequency Ordered Stop   04/01/19 0900  azithromycin (ZITHROMAX) 500 mg in sodium chloride 0.9 % 250 mL IVPB     500 mg 250 mL/hr over 60 Minutes Intravenous Every 24 hours 03/31/19 0822 04/05/19 0810   04/01/19 0800  cefTRIAXone (ROCEPHIN) 2 g in sodium chloride 0.9 % 100 mL IVPB     2 g 200 mL/hr over 30 Minutes Intravenous Every 24 hours 03/31/19 0822 04/05/19 0811   03/31/19 0615  cefTRIAXone (ROCEPHIN) 1 g in sodium chloride 0.9 % 100 mL IVPB     1 g 200 mL/hr over 30 Minutes Intravenous  Once 03/31/19 0610 03/31/19 0935   03/31/19 0615  azithromycin (ZITHROMAX) 500 mg in sodium chloride 0.9 % 250 mL IVPB     500 mg 250 mL/hr over 60 Minutes Intravenous  Once 03/31/19 0610 03/31/19 1211   03/31/19 0245  doxycycline (VIBRA-TABS) tablet 100 mg     100 mg Oral  Once 03/31/19 0232 03/31/19 0408      Subjective  The patient complains of jaw pain. This is a chronic complaint. No new complaints.    Objective   Vitals:  Vitals:   04/19/19 0601 04/19/19 0929  BP: (!) 112/48 (!) 152/66  Pulse: 69 75  Resp: 20   Temp: 98.3 F (36.8 C)   SpO2: 94%    Exam:  Constitutional:  . The patient is awake, alert, and oriented x 3. No acute distress. Respiratory:  . No increased work of breathing. . No wheezes, rales, or rhonchi . No tactile fremitus Cardiovascular:  . Regular rate and rhythm . No murmurs, ectopy, or gallups. . No lateral PMI. No thrills. Abdomen:  . Abdomen is soft, non-tender, non-distended . No hernias, masses, or organomegaly . Normoactive bowel sounds.  Musculoskeletal:  . No cyanosis, clubbing, or edema Skin:  . No rashes, lesions, ulcers . palpation of skin: no induration or nodules Neurologic:  . CN 2-12 intact . Sensation all 4 extremities intact Psychiatric:  . Mental status o Mood, affect appropriate o Orientation to person, place, time  . judgment and insight appear intact  I have personally reviewed the following:   Today's Data  . Vitals, Glucoses, CBC  Scheduled Meds: . amLODipine  5 mg Oral Daily  . atorvastatin  40 mg Oral q1800  . enoxaparin (LOVENOX) injection  40 mg Subcutaneous Q24H  . ferrous sulfate  325 mg Oral BID WC  . gabapentin  400 mg Oral 3 times per day  . gabapentin  600 mg Oral QHS  .  guaiFENesin  10 mL Oral Q6H  . insulin aspart  0-5 Units Subcutaneous QHS  . insulin aspart  0-9 Units Subcutaneous TID WC  . insulin aspart  3 Units Subcutaneous TID WC  . insulin glargine  7 Units Subcutaneous BID  . lisinopril  2.5 mg Oral Daily  . LORazepam  1 mg Intravenous Once  . neomycin-polymyxin-hydrocortisone  4 drop Right EAR Q8H  . nicotine  21 mg Transdermal Daily  . senna-docusate  2 tablet Oral BID   Continuous Infusions: . sodium chloride Stopped (04/02/19 1000)    Principal Problem:   Sepsis due to pneumonia [facility]) Active Problems:   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 ([name]    Tobacco use   Hyponatremia   CKD (chronic kidney disease), stage III   Normocytic anemia   Otitis media   Homelessness   LOS: 19 days   A & P  Sepsis: Resolved. Due to Pneumonia. The patient has completed a course of IV Ceftriaxone and Azithromycin for this. COVID-19 and RVP are negative. He is now saturating 94% on room air.  Labile and uncontrolled type 1 diabetes complicated by with diabetic neuropathy and hypoglycemia: The patient reports his blood sugar has been as high as 1500 and as low as 8!!!!  Hemoglobin A1c 9.1 , 04/01/2019.  On gabapentin for diabetic neuropathy. Hypoglycemic on 04/16/2018 afternoon at 54.   Now hyperglycemic with glucoses running from the high 200's to low 400's. Diabetic coordinator also following.  Peripheral neuropathy/ Diffuse joint pain. On gabapentin, increased gabapentin dose. On Percocet 1-2 tablets every 6 as needed for severe joint pain. Nursing has reported the patient hiding percocet's in his sheets. Continue bowel regimen to avoid opiate-induced constipation.  Resolved intractable nausea with vomiting likely in the setting of of hypo-glycemia: IV Zofran as needed. Monitor glucoses.   Essential hypertension: Blood pressures under fair control on amlodipine and lisinopril. Monitor.  Chronic normocytic anemia: Hgb 11-13 (baseline)>10.2 (admit)>>9.4.   History of chronic hepatitis C: HCV RNA undetectable in 12/2016 and 04/2017  Tobacco use disorder: Reports smoking about a pack a day. The patient has had 7-10 minutes of tobacco cessation counseling. Nicotine patch has been offered.  Alcohol abuse in remission: Noted  Hyponatremia: Resolved. Monitor.  Hyperlipidemia continue statin.  AKI: Intravascular volume contracted, encourage to increase oral fluid intake.  Ambulatory dysfunction/physical debility: PT assessed and recommended SNF. Pt not safe for discharge per PT/OT due to instability on feet and high risk of falls. CSW assisting with  SNF placement. Awaiting SNF placement. Continue PT OT with assistance and fall precautions. Patient is homeless.  Questionable fall: Per bedside RN Arlina Robes. Unwitnessed 04/15/2019.  No apparent injuries.  I have seen and examined this patient myself. I have spent 30 minutes in his evaluation and care.  DVT prophylaxis:Lovenox    Code Status:Full code Family Communication:None available. 2 estranged children in the [place] area. Disposition Plan:Final disposition SNF.No safe discharge at this time.   Ronav Furney, DO [name] Direct contact: see [email or web address]  7PM-7AM contact night coverage as above 04/19/2019, 12:32 PM  LOS: 17 days

## 2019-04-19 NOTE — Progress Notes (Signed)
Patient reported persistent heartburn with eating and need to eat slow.  Educated patient on fall safety as patient declined bed alarm. Educated patient to call for assistance when out of bed to bathroom,patient agreed. Walker, per patient request, placed by bedside  1455 patient report vomiting when attempting to eat, heartburn and feeling of getting stuck in throat. Offered Maalox, patient accepted, med aministered and patient will attempt to eat sandwich in approx 30 minutes  Patient stated wants to talk to CM or SW about prior living arrangement at [facility].  Paged [facility] at [employer]. Pt report feeling of stuck/tightness in throat (stricture?)w/ swallowing & causes nausea & vomiting with eating

## 2019-04-19 NOTE — Progress Notes (Addendum)
Physical Therapy Treatment Patient Details Name: [name] MRN: [id number] DOB: 03/27/1956 Today's Date: 04/19/2019    History of Present Illness Pt is a 64 y/o male with PMH of DM, HTN, chronic hepatitis C, polysubstance abuse, CKD III, presents with generalized weakness, decreased PO intake, and nausea over the last 3 days. Found with sepsis secondary to community acquired pneumonia.     PT Comments    Patient seen for mobility progression. Pt requires supervision/min guard for functional transfers and gait training using RW. Pt with impaired balance and unable to don mask in standing without UE support. Continue to progress as tolerated.     Follow Up Recommendations  SNF (HHPT with 24 hour supervision/assistance if an option)     Equipment Recommendations  Rolling walker with 5" wheels    Recommendations for Other Services       Precautions / Restrictions Precautions Precautions: Fall Restrictions Weight Bearing Restrictions: No    Mobility  Bed Mobility Overal bed mobility: Independent                Transfers Overall transfer level: Needs assistance Equipment used: Rolling walker (2 wheeled) Transfers: Sit to/from Stand Sit to Stand: Supervision;Min guard         General transfer comment: pt stood X 2 trials from EOB; supervision to min guard for safety; use of RW upon standing   Ambulation/Gait Ambulation/Gait assistance: Min Gaffer (Feet): 200 Feet Assistive device: Rolling walker (2 wheeled) Gait Pattern/deviations: Step-through pattern;Decreased stride length;Decreased dorsiflexion - right;Decreased dorsiflexion - left;Trunk flexed Gait velocity: decreased   General Gait Details: decreased cadence and stride length; cues for upright posture   Stairs             Wheelchair Mobility    Modified Rankin (Stroke Patients Only)       Balance Overall balance assessment: Needs assistance Sitting-balance support:  Feet supported Sitting balance-Leahy Scale: Good     Standing balance support: During functional activity;Single extremity supported;Bilateral upper extremity supported Standing balance-Leahy Scale: Poor Standing balance comment: pt requires UE support for standing balance; pt with LOB while attempting to don mask and required sitting down to don mask with both hands                            Cognition Arousal/Alertness: Awake/alert Behavior During Therapy: [facility] for tasks assessed/performed;Restless;Impulsive Overall Cognitive Status: No family/caregiver present to determine baseline cognitive functioning Area of Impairment: Safety/judgement;Memory                     Memory: Decreased short-term memory   Safety/Judgement: Decreased awareness of safety;Decreased awareness of deficits            Exercises      General Comments        Pertinent Vitals/Pain Pain Assessment: Faces Faces Pain Scale: No hurt    Home Living                      Prior Function            PT Goals (current goals can now be found in the care plan section) Progress towards PT goals: Progressing toward goals    Frequency    Min 2X/week      PT Plan Current plan remains appropriate    Co-evaluation              AM-PAC PT "6 Clicks"  Mobility   Outcome Measure  Help needed turning from your back to your side while in a flat bed without using bedrails?: None Help needed moving from lying on your back to sitting on the side of a flat bed without using bedrails?: None Help needed moving to and from a bed to a chair (including a wheelchair)?: None Help needed standing up from a chair using your arms (e.g., wheelchair or bedside chair)?: None Help needed to walk in hospital room?: A Little Help needed climbing 3-5 steps with a railing? : A Little 6 Click Score: 22    End of Session Equipment Utilized During Treatment: Gait belt Activity Tolerance:  Patient tolerated treatment well Patient left: in bed;with call bell/phone within reach Nurse Communication: Mobility status PT Visit Diagnosis: Unsteadiness on feet (R26.81);Muscle weakness (generalized) (M62.81);Difficulty in walking, not elsewhere classified (R26.2)     Time: 4920-1007 PT Time Calculation (min) (ACUTE ONLY): 18 min  Charges:  $Gait Training: 8-22 mins                     Earney Navy, PTA Acute Rehabilitation Services Pager: [phone] Office: [phone]     Darliss Cheney 04/19/2019, 4:58 PM

## 2019-04-20 ENCOUNTER — Inpatient Hospital Stay (HOSPITAL_COMMUNITY): Payer: Medicare HMO

## 2019-04-20 LAB — GLUCOSE, CAPILLARY
Glucose-Capillary: 208 mg/dL — ABNORMAL HIGH (ref 70–99)
Glucose-Capillary: 226 mg/dL — ABNORMAL HIGH (ref 70–99)
Glucose-Capillary: 156 mg/dL — ABNORMAL HIGH (ref 70–99)
Glucose-Capillary: 95 mg/dL (ref 70–99)
Glucose-Capillary: 139 mg/dL — ABNORMAL HIGH (ref 70–99)

## 2019-04-20 MED ORDER — ALUM & MAG HYDROXIDE-SIMETH 200-200-20 MG/5ML PO SUSP
30.0000 mL | Freq: Once | ORAL | Status: AC
  Administered 2019-04-20: 30 mL via ORAL
  Filled 2019-04-20: qty 30

## 2019-04-20 MED ORDER — LIDOCAINE VISCOUS HCL 2 % MT SOLN
15.0000 mL | Freq: Once | OROMUCOSAL | Status: AC
  Administered 2019-04-20: 15 mL via ORAL
  Filled 2019-04-20: qty 15

## 2019-04-20 NOTE — Progress Notes (Signed)
Pt refused alarm to be turned on

## 2019-04-20 NOTE — Progress Notes (Signed)
PROGRESS NOTE  [name] OIN:[id number] DOB: 1955/06/15 DOA: 03/30/2019 PCP: Patient, No Pcp Per  Brief History   64 year old homeless male with history of IDDM-1, HTN, chronic hepatitis C, polysubstance abuse, and chronic pain presented with generalized weakness, nausea and poor p.o. intake for 3 days and admitted for sepsis due to CAPandright acute otitis media. Completed course of ceftriaxone and azithromycin.  Hospital course complicated by labile blood sugars.  Decreased insulin doses.  Diabetes coordinator also following.  Generalized weakness.  PT OT recommended SNF.  CSW assisting with placement. Pt is awaiting placement. He has refused maintenanc of IV access and use of bed alarm.  Labile blood sugars, debility, ambulatory dysfunction, and lack of options for placement have caused for discharge to be unsafe at this time. Per TOC, finding SNF placement for this patient has been exceedingly difficult due to homelessness, substances abuse history and behaviors. He will be discharged to shelter with he is medically clear.  This morning the patient is complaining of a feeling that food gets stuck in his esophagus. He states that he has previously been diagnosed with an esophageal stricture and was to have followed up with Dr. Allison Quarry for dilatation. He is complaining that it is becoming more severe in the last day.  Consultants  . None  Procedures  . None  Antibiotics   Anti-infectives (From admission, onward)   Start     Dose/Rate Route Frequency Ordered Stop   04/01/19 0900  azithromycin (ZITHROMAX) 500 mg in sodium chloride 0.9 % 250 mL IVPB     500 mg 250 mL/hr over 60 Minutes Intravenous Every 24 hours 03/31/19 0822 04/05/19 0810   04/01/19 0800  cefTRIAXone (ROCEPHIN) 2 g in sodium chloride 0.9 % 100 mL IVPB     2 g 200 mL/hr over 30 Minutes Intravenous Every 24 hours 03/31/19 0822 04/05/19 0811   03/31/19 0615  cefTRIAXone (ROCEPHIN) 1 g in sodium chloride 0.9 % 100 mL  IVPB     1 g 200 mL/hr over 30 Minutes Intravenous  Once 03/31/19 0610 03/31/19 0935   03/31/19 0615  azithromycin (ZITHROMAX) 500 mg in sodium chloride 0.9 % 250 mL IVPB     500 mg 250 mL/hr over 60 Minutes Intravenous  Once 03/31/19 0610 03/31/19 1211   03/31/19 0245  doxycycline (VIBRA-TABS) tablet 100 mg     100 mg Oral  Once 03/31/19 0232 03/31/19 0408      Subjective  The patient complains of difficulty swallowing and feeling that food is stuck in his esophagus.   Objective   Vitals:  Vitals:   04/20/19 0849 04/20/19 1252  BP: 132/70 (!) 127/59  Pulse: 75 68  Resp: 20 18  Temp: 97.9 F (36.6 C) 98.2 F (36.8 C)  SpO2: 93% 98%   Exam:  Constitutional:  . The patient is awake, alert, and oriented x 3. No acute distress. Respiratory:  . No increased work of breathing. . No wheezes, rales, or rhonchi . No tactile fremitus Cardiovascular:  . Regular rate and rhythm . No murmurs, ectopy, or gallups. . No lateral PMI. No thrills. Abdomen:  . Abdomen is soft, non-tender, non-distended . No hernias, masses, or organomegaly . Normoactive bowel sounds.  Musculoskeletal:  . No cyanosis, clubbing, or edema Skin:  . No rashes, lesions, ulcers . palpation of skin: no induration or nodules Neurologic:  . CN 2-12 intact . Sensation all 4 extremities intact Psychiatric:  . Mental status o Mood, affect appropriate o Orientation to person,  place, time  . judgment and insight appear intact  I have personally reviewed the following:   Today's Data  . Vitals, Glucoses, CBC  Scheduled Meds: . amLODipine  5 mg Oral Daily  . atorvastatin  40 mg Oral q1800  . enoxaparin (LOVENOX) injection  40 mg Subcutaneous Q24H  . ferrous sulfate  325 mg Oral BID WC  . gabapentin  400 mg Oral 3 times per day  . gabapentin  600 mg Oral QHS  . guaiFENesin  10 mL Oral Q6H  . insulin aspart  0-5 Units Subcutaneous QHS  . insulin aspart  0-9 Units Subcutaneous TID WC  . insulin aspart   3 Units Subcutaneous TID WC  . insulin glargine  7 Units Subcutaneous BID  . lisinopril  2.5 mg Oral Daily  . LORazepam  1 mg Intravenous Once  . neomycin-polymyxin-hydrocortisone  4 drop Right EAR Q8H  . nicotine  21 mg Transdermal Daily  . senna-docusate  2 tablet Oral BID   Continuous Infusions: . 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 Due to Pneumonia. The patient has completed a course of IV Ceftriaxone and Azithromycin for this. COVID-19 and RVP are negative. He is now saturating 94% on room air.  Labile and uncontrolled type 1 diabetes complicated by with diabetic neuropathy and hypoglycemia: The patient reports his blood sugar has been as high as 1500 and as low as 8!!!!  Hemoglobin A1c 9.1 , 04/01/2019.  On gabapentin for diabetic neuropathy. Hypoglycemic on 04/16/2018 afternoon at 54.   Now hyperglycemic with glucoses running from the high 200's to low 400's. Diabetic coordinator also following.  Esophageal dysphagia: Will order an esophagram. Consult gastroenterology pending results.   Peripheral neuropathy/ Diffuse joint pain. On gabapentin, increased gabapentin dose. On Percocet 1-2 tablets every 6 as needed for severe joint pain. Nursing has reported the patient hiding percocet's in his sheets. Continue bowel regimen to avoid opiate-induced constipation.  Resolved intractable nausea with vomiting likely in the setting of of hypo-glycemia: IV Zofran as needed. Monitor glucoses.   Essential hypertension: Blood pressures under fair control on amlodipine and lisinopril. Monitor.  Chronic normocytic anemia: Hgb 11-13 (baseline)>10.2 (admit)>>9.4.   History of  chronic hepatitis C: HCV RNA undetectable in 12/2016 and 04/2017  Tobacco use disorder: Reports smoking about a pack a day. The patient has had 7-10 minutes of tobacco cessation counseling. Nicotine patch has been offered.  Alcohol abuse in remission: Noted  Hyponatremia: Resolved. Monitor.  Hyperlipidemia continue statin.  AKI: Intravascular volume contracted, encourage to increase oral fluid intake.  Ambulatory dysfunction/physical debility: PT assessed and recommended SNF. Pt not safe for discharge per PT/OT due to instability on feet and high risk of falls. CSW assisting with SNF placement. Awaiting SNF placement. Continue PT OT with assistance and fall precautions. Patient is homeless.  Questionable fall: Per bedside RN Arlina Robes. Unwitnessed 04/15/2019.  No apparent injuries.  I have seen and examined this patient myself. I have spent 32 minutes in his evaluation and care.  DVT prophylaxis:Lovenox    Code Status:Full code Family Communication:None available. 2 estranged children in the [place] area. Disposition Plan:Discharge to shelter  or hotel when patient is medically clear. No discharge to SNF is likely to become available due to homelessness, substances abuse history and behaviors.  [name] Sevcik, DO [name] Direct contact: see [email or web address]  7PM-7AM contact night coverage as above 04/20/2019, 3:00 PM  LOS: 17 days

## 2019-04-20 NOTE — Progress Notes (Signed)
Upon reassessment at 1140, patient stated Zofran alleviated symptoms of heartburn, nausea and throat tightness with eating.    Call from IR at 1245 needs to be NPO for 3 hours before DG esophagus. Will plan for 1600  1511 notified by IR patient to be NPO at midnight for esophagus xray on 04/21/2019 in AM. Patient informed.   Paged [place] at [employer].  Pt. complain of stomachache & throat tightness. Could he have a "GI cocktail" (Maalox + Lidocaine?)?[name]  Assisted with ordering new dinner tray for better GI/throat comfort at 1818. Upon reassessment at [place], GI discomfort lessened with Maalox/Lidocaine.

## 2019-04-20 NOTE — Progress Notes (Signed)
Patient has no SNF bed offers due to homelessness, substance abuse history as well as behaviors. Plan to dc to shelter when medically stable.   CSW has lvm with [facility] to inquire as to shelter bed availability.   CSW has lvm with Daryel Gerald through [employer] to inquire regarding hotel voucher program, pending response at this time.   [place], [place]

## 2019-04-21 ENCOUNTER — Inpatient Hospital Stay (HOSPITAL_COMMUNITY): Payer: Medicare HMO

## 2019-04-21 DIAGNOSIS — K2101 Gastro-esophageal reflux disease with esophagitis, with bleeding: Secondary | ICD-10-CM

## 2019-04-21 DIAGNOSIS — K222 Esophageal obstruction: Secondary | ICD-10-CM

## 2019-04-21 DIAGNOSIS — R1310 Dysphagia, unspecified: Secondary | ICD-10-CM

## 2019-04-21 LAB — GLUCOSE, CAPILLARY
Glucose-Capillary: 129 mg/dL — ABNORMAL HIGH (ref 70–99)
Glucose-Capillary: 130 mg/dL — ABNORMAL HIGH (ref 70–99)
Glucose-Capillary: 186 mg/dL — ABNORMAL HIGH (ref 70–99)
Glucose-Capillary: 347 mg/dL — ABNORMAL HIGH (ref 70–99)
Glucose-Capillary: 410 mg/dL — ABNORMAL HIGH (ref 70–99)

## 2019-04-21 MED ORDER — FERROUS SULFATE 325 (65 FE) MG PO TABS
325.0000 mg | ORAL_TABLET | Freq: Two times a day (BID) | ORAL | Status: DC
  Administered 2019-04-22 – 2019-04-23 (×2): 325 mg via ORAL
  Filled 2019-04-21 (×2): qty 1

## 2019-04-21 MED ORDER — PANTOPRAZOLE SODIUM 40 MG PO TBEC
40.0000 mg | DELAYED_RELEASE_TABLET | Freq: Two times a day (BID) | ORAL | Status: DC
  Administered 2019-04-21 – 2019-04-23 (×5): 40 mg via ORAL
  Filled 2019-04-21 (×5): qty 1

## 2019-04-21 NOTE — Progress Notes (Signed)
PROGRESS NOTE  NESTOR WIENEKE WJX:[id number] DOB: Feb 08, 1956 DOA: 03/30/2019 PCP: Patient, No Pcp Per  Brief History   64 year old homeless male with history of IDDM-1, HTN, chronic hepatitis C, polysubstance abuse, and chronic pain presented with generalized weakness, nausea and poor p.o. intake for 3 days and admitted for sepsis due to CAPandright acute otitis media. Completed course of ceftriaxone and azithromycin.  Hospital course complicated by labile blood sugars.  Decreased insulin doses.  Diabetes coordinator also following.  Generalized weakness.  PT OT recommended SNF.  CSW assisting with placement. Pt is awaiting placement. He has refused maintenanc of IV access and use of bed alarm.  Labile blood sugars, debility, ambulatory dysfunction, and lack of options for placement have caused for discharge to be unsafe at this time. Per TOC, finding SNF placement for this patient has been exceedingly difficult due to homelessness, substances abuse history and behaviors. He will be discharged to shelter with he is medically clear.  This morning the patient is complaining of a feeling that food gets stuck in his esophagus. He states that he has previously been diagnosed with an esophageal stricture and was to have followed up with Dr. Allison Quarry for dilatation. He is complaining that it is becoming more severe in the last day.  Esophagram demonstrated esophageal stricture and the study also documented silent aspiration.  Consultants  . Gastroenterology  Procedures  . None  Antibiotics   Anti-infectives (From admission, onward)   Start     Dose/Rate Route Frequency Ordered Stop   04/01/19 0900  azithromycin (ZITHROMAX) 500 mg in sodium chloride 0.9 % 250 mL IVPB     500 mg 250 mL/hr over 60 Minutes Intravenous Every 24 hours 03/31/19 0822 04/05/19 0810   04/01/19 0800  cefTRIAXone (ROCEPHIN) 2 g in sodium chloride 0.9 % 100 mL IVPB     2 g 200 mL/hr over 30 Minutes Intravenous Every 24  hours 03/31/19 0822 04/05/19 0811   03/31/19 0615  cefTRIAXone (ROCEPHIN) 1 g in sodium chloride 0.9 % 100 mL IVPB     1 g 200 mL/hr over 30 Minutes Intravenous  Once 03/31/19 0610 03/31/19 0935   03/31/19 0615  azithromycin (ZITHROMAX) 500 mg in sodium chloride 0.9 % 250 mL IVPB     500 mg 250 mL/hr over 60 Minutes Intravenous  Once 03/31/19 0610 03/31/19 1211   03/31/19 0245  doxycycline (VIBRA-TABS) tablet 100 mg     100 mg Oral  Once 03/31/19 0232 03/31/19 0408      Subjective  The patient is upset that his diet has been changed to a full liquid diet.  Objective   Vitals:  Vitals:   04/21/19 0345 04/21/19 1138  BP: (!) 115/50 (!) 155/66  Pulse: 68 73  Resp: 16 18  Temp: 98.2 F (36.8 C) 98.1 F (36.7 C)  SpO2: 96%    Exam:  Constitutional:  . The patient is awake, alert, and oriented x 3. No acute distress. Respiratory:  . No increased work of breathing. . No wheezes, rales, or rhonchi . No tactile fremitus Cardiovascular:  . Regular rate and rhythm . No murmurs, ectopy, or gallups. . No lateral PMI. No thrills. Abdomen:  . Abdomen is soft, non-tender, non-distended . No hernias, masses, or organomegaly . Normoactive bowel sounds.  Musculoskeletal:  . No cyanosis, clubbing, or edema Skin:  . No rashes, lesions, ulcers . palpation of skin: no induration or nodules Neurologic:  . CN 2-12 intact . Sensation all 4 extremities intact Psychiatric:  .  Mental status o Mood, affect appropriate o Orientation to person, place, time  . judgment and insight appear intact  I have personally reviewed the following:   Today's Data  . Vitals, Glucoses, CBC  Scheduled Meds: . amLODipine  5 mg Oral Daily  . atorvastatin  40 mg Oral q1800  . [START ON 04/22/2019] ferrous sulfate  325 mg Oral BID WC  . gabapentin  400 mg Oral 3 times per day  . gabapentin  600 mg Oral QHS  . guaiFENesin  10 mL Oral Q6H  . insulin aspart  0-5 Units Subcutaneous QHS  . insulin aspart   0-9 Units Subcutaneous TID WC  . insulin aspart  3 Units Subcutaneous TID WC  . insulin glargine  7 Units Subcutaneous BID  . lisinopril  2.5 mg Oral Daily  . LORazepam  1 mg Intravenous Once  . neomycin-polymyxin-hydrocortisone  4 drop Right EAR Q8H  . nicotine  21 mg Transdermal Daily  . pantoprazole  40 mg Oral BID  . senna-docusate  2 tablet Oral BID   Continuous Infusions: . 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 Due to Pneumonia. The patient has completed a course of IV Ceftriaxone and Azithromycin for this. COVID-19 and RVP are negative. He is now saturating 94% on room air.  Labile and uncontrolled type 1 diabetes complicated by with diabetic neuropathy and hypoglycemia: The patient reports his blood sugar has been as high as 1500 and as low as 8!!!!  Hemoglobin A1c 9.1 , 04/01/2019.  On gabapentin for diabetic neuropathy. Hypoglycemic on 04/16/2018 afternoon at 54.   Now hyperglycemic with glucoses running from the high 200's to low 400's. Diabetic coordinator also following.  Esophageal dysphagia: Esophagram demonstrated stricture and irregularity of the esophageal mucosa. GI has been consulted and EGD is planned for tomorrow. The patient will be on a full liquid diet until Midnight.   Aspiration: Silent aspiration of barium was seen on Esophagram.    Peripheral neuropathy/ Diffuse joint pain. On gabapentin, increased gabapentin dose. On Percocet 1-2 tablets every 6 as needed for severe joint pain. Nursing has reported the patient hiding percocet's in his sheets. Continue bowel regimen to avoid opiate-induced constipation.  Resolved intractable nausea with vomiting  likely in the setting of of hypo-glycemia: IV Zofran as needed. Monitor glucoses.   Essential hypertension: Blood pressures under fair control on amlodipine and lisinopril. Monitor.  Chronic normocytic anemia: Hgb 11-13 (baseline)>10.2 (admit)>>9.4.   History of chronic hepatitis C: HCV RNA undetectable in 12/2016 and 04/2017  Tobacco use disorder: Reports smoking about a pack a day. The patient has had 7-10 minutes of tobacco cessation counseling. Nicotine patch has been offered.  Alcohol abuse in remission: Noted  Hyponatremia: Resolved. Monitor.  Hyperlipidemia continue statin.  AKI: Intravascular volume contracted, encourage to increase oral fluid intake.  Ambulatory dysfunction/physical debility: PT will be discharged to [place] vs hotel as soon as medically cleared.. SNF placement for this patient has not been able to be secured.   Questionable fall: Per bedside RN Arlina Robes. Unwitnessed 04/15/2019.  No apparent injuries.  I have seen and examined this patient myself. I have spent  35 minutes in his evaluation and care.  DVT prophylaxis:Lovenox    Code Status:Full code Family Communication:None available. 2 estranged children in the [place] area. Disposition Plan:Discharge to shelter or hotel when patient is medically clear. No discharge to SNF is likely to become available due to homelessness, substances abuse history and behaviors.  Brycelynn Stampley, DO [name] Direct contact: see [email or web address]  7PM-7AM contact night coverage as above 04/21/2019, 3:39 PM  LOS: 17 days

## 2019-04-21 NOTE — Progress Notes (Addendum)
[facility] Consult: 9:08 AM 04/21/2019  LOS: 21 days    Referring Provider: Dr Benny Lennert.    Primary Care Physician:  Patient, No Pcp Per Primary Gastroenterologist:  Dr Rush Landmark.      Reason for Consultation:  Dysphagia, abnormal  Esophagram   HPI: [name] is a 64 y.o. male.  PMH polysubstance abuse.  IDDM.  Hypertension.  PAD, s/p iliac stent..  Chronic pain.  Osteomyelitis, toe resection.  Peripheral neuropathy with signif issues walking.  Chronic hepatitis C, status post treatment, F0/F1 liver fibrosis.  Heroin overdose 09/2018.  Patient is homeless.   08/25/2018 EGD For evaluation of melena, coffee-ground emesis, FOBT positive.  Dr. Stefani Dama Roddy.  Severe esophagitis, black coloration in portions, biopsied.  Esophageal ulcers versus healing MW tear in distal esophagus.  Nonbleeding, clean-based gastric ulcers.  Discolored, friable, inflamed gastritis, question ischemic changes.  Erosive gastropathy, biopsied.  Erythematous to adenopathy. Path: Candida esophagitis.  Stains negative for HSV.  Nonspecific reactive gastropathy with erosion.  Stain negative for H. pylori.  Severe acute esophagitis with ulceration, differential DX includes drug/toxin induced injury, pancreaticobiliary reflux, ischemia can show similar histology and cannot be ruled out.  Patient never took prescribed bid PPI initially but finally filled Rx and took PPI as of July. However leading up to this current hospitalization he was not taking any meds so it is unclear when he last used PPI. 11/16/2018 EGD for esophagitis follow-up and c/o /dysphagia..  Dr Hilarie Fredrickson.  Severe esophagitis was biopsied.  Benign-appearing esophageal stenosis, 8 mm diameter at narrowest.  Passage of the endoscope resulted in multiple mucosal rents with self-limited  bleeding, no other dilations were performed.  Normal stomach.  Single, nonbleeding duodenal AVM.  Path: Reactive squamous mucosa, marked acute inflammation, ulcer.  Stains negative for HSV-1, HSV-2, CMV.  Patient says he had a short period of improved swallowing after the EGD, but the solid and sometimes liquid food dysphagia quickly recurred.  He ends up regurgitating.  Sometimes he can get the food to go down if he eats slowly of soft or liquid texture foods. Patient canceled EGD set for 12/16/2018 and no showed for office visit on 11/24/2018.  Admitted 3 weeks ago with CAP, sepsis, otitis media.  Completed Rocephin/azithromycin.  Has had labile blood sugars, generalized weakness, ambulatory dysfunction.  PT/OT recommending SNF placement but finding a bed has been challenging due to his behavioral disturbances, substance abuse and homeless status.  Resulting in the prolonged hospitalization. Yesterday morning he complained to the hospitalist that the dysphagia had gotten acutely worse and food is getting stuck in his esophagus. Ba esophagram demonstrates 6 cm segment of narrowing and irregularity in the distal esophagus.  Lumen reduced to 4 mm diameter.  GE junction itself appears normal.  Silent aspiration of barium to trachea and lower airways during study.  Stable, normocytic anemia, Hb 10.3. Albumin low but otherwise normal LFTs. Improving AKI.  Patient informs me he has not used crack cocaine in the in several months and has not used opiates/heroin in a few months.  No alcohol.  He has been living in [employer].   Past Medical History:  Diagnosis Date   DEPRESSION    DIABETES MELLITUS, TYPE I    DRUG ABUSE    pt should have NO controlled substances rx'ed   Glaucoma    HEPATITIS C    chronic   Heroin overdose ([place]) 10/03/2018   Heroin use 10/03/2018   Hypertension 02/18/2011   Proliferative diabetic retinopathy(362.02)    Vitiligo     Past Surgical History:  Procedure  Laterality Date   ABDOMINAL AORTOGRAM W/LOWER EXTREMITY N/A 07/25/2016   Procedure: Abdominal Aortogram w/Bilateral Lower Extremity Runoff;  Surgeon: Conrad Chadwick, MD;  Location: [facility] [facility];  Service: Cardiovascular;  Laterality: N/A;   ABDOMINAL AORTOGRAM W/LOWER EXTREMITY N/A 12/24/2016   Procedure: ABDOMINAL AORTOGRAM W/LOWER EXTREMITY;  Surgeon: Serafina Mitchell, MD;  Location: [facility];  Service: Cardiovascular;  Laterality: N/A;   AMPUTATION TOE Right 07/26/2016   Procedure: AMPUTATION GREAT TOE;  Surgeon: Serafina Mitchell, MD;  Location: [place];  Service: Vascular;  Laterality: Right;   BIOPSY  08/25/2018   Procedure: BIOPSY;  Surgeon: Irving Copas., MD;  Location: [place];  Service: Gastroenterology;;   BIOPSY  11/16/2018   Procedure: BIOPSY;  Surgeon: Jerene Bears, MD;  Location: [facility];  Service: Gastroenterology;;   ESOPHAGOGASTRODUODENOSCOPY N/A 08/25/2018   Procedure: ESOPHAGOGASTRODUODENOSCOPY (EGD);  Surgeon: Irving Copas., MD;  Location: [place];  Service: Gastroenterology;  Laterality: N/A;   ESOPHAGOGASTRODUODENOSCOPY (EGD) WITH PROPOFOL N/A 11/16/2018   Procedure: ESOPHAGOGASTRODUODENOSCOPY (EGD) WITH PROPOFOL;  Surgeon: Jerene Bears, MD;  Location: [place];  Service: Gastroenterology;  Laterality: N/A;   EYE SURGERY     retinal surgery x 2, right eye   INSERTION OF ILIAC STENT Right 07/26/2016   Procedure: INSERTION OF RIGHT POPLITEAL STENT WITH BALLOON ANGIOPLASTY;  Surgeon: Serafina Mitchell, MD;  Location: [place];  Service: Vascular;  Laterality: Right;   LOWER EXTREMITY ANGIOGRAM Right 07/26/2016   Procedure: LOWER EXTREMITY ANGIOGRAM;  Surgeon: Serafina Mitchell, MD;  Location: [place];  Service: Vascular;  Laterality: Right;   PERIPHERAL VASCULAR BALLOON ANGIOPLASTY Right 07/26/2016   Procedure: PERIPHERAL VASCULAR BALLOON ANGIOPLASTY  RIGHT ANTERIOR TIBEAL ARTERY AND RIGHT SUPERFICIAL FEMORAL ARTERY;  Surgeon: Serafina Mitchell, MD;  Location: [place];  Service: Vascular;  Laterality: Right;   PERIPHERAL VASCULAR INTERVENTION Right 12/24/2016   Procedure: PERIPHERAL VASCULAR INTERVENTION;  Surgeon: Serafina Mitchell, MD;  Location: [place];  Service: Cardiovascular;  Laterality: Right;  lower extr    Prior to Admission medications   Medication Sig Start Date End Date Taking? Authorizing Provider  Accu-Chek Softclix Lancets lancets Use to monitor glucose levels BID; E10.29 08/19/18  Yes Renato Shin, MD  Blood Glucose Calibration (ACCU-CHEK AVIVA) SOLN 1 each by In Vitro route as needed. Use to calibrate meter prn 08/19/18  Yes Renato Shin, MD  blood glucose meter kit and supplies KIT Dispense based on patient and insurance preference. Use up to four times daily as directed. (FOR ICD-9 250.00, 250.01). 08/15/18  Yes Mikhail, [place], DO  Blood Glucose Monitoring Suppl (ACCU-CHEK AVIVA PLUS) w/Device KIT 1 each by Does not apply route 2 (two) times a day. Use to monitor glucose levels BID; E10.29 08/19/18  Yes Renato Shin, MD  glucose blood (ACCU-CHEK AVIVA PLUS) test strip Use to monitor glucose levels BID; E10.29 08/19/18  Yes Renato Shin, MD  insulin aspart protamine- aspart (NOVOLOG MIX 70/30) (70-30) 100 UNIT/ML  injection Inject 0.15 mLs (15 Units total) into the skin 2 (two) times daily with a meal. 03/14/19  Yes Upstill, Shari, PA-C  Insulin Pen Needle 31G X 5 MM MISC Use with novolog flex pen 08/15/18  Yes Mikhail, [place], DO  acetaminophen (TYLENOL) 325 MG tablet Take 2 tablets (650 mg total) by mouth every 6 (six) hours as needed for mild pain or headache. 04/07/19   Mercy Riding, MD  amLODipine (NORVASC) 10 MG tablet Take 1 tablet (10 mg total) by mouth daily. 04/08/19   Mercy Riding, MD  atorvastatin (LIPITOR) 40 MG tablet Take 1 tablet (40 mg total) by mouth daily at 6 PM. 04/07/19   Gonfa, Charlesetta Ivory, MD  gabapentin (NEURONTIN) 300 MG capsule Take 1 capsule (300 mg total) by mouth 2 (two) times daily  at 10 am and 4 pm AND 3 capsules (900 mg total) at bedtime. 04/07/19   Mercy Riding, MD  insulin aspart (NOVOLOG) 100 UNIT/ML injection Glucose < 70: implement hypoglycemia protocol Glucose 70 - 120: 0 units Glucose 121 - 150: 2 units Glucose 151 - 200: 3 units Glucose 201 - 250: 5 units Glucose 251 - 300: 8 units Glucose 301 - 350: 11 units Glucose 351 - 400: 15 units and call your doctor or go to emergency department  5.  Correction coverage at bedtime (9pm) Glucose < 70: implement hypoglycemia protocol Glucose  70 - 120: 0 units Glucose 121 - 150: 0 units Glucose 151 - 200: 0 units Glucose 201 - 250: 2 units Glucose 251 - 300: 3 units Glucose 301 - 350: 4 units and call you doctor or go to emergency department 04/07/19   Mercy Riding, MD  insulin aspart (NOVOLOG) 100 UNIT/ML injection Inject 4 Units into the skin 3 (three) times daily with meals. If he eats greater than 50% of his meals 04/07/19   Wendee Beavers T, MD  insulin glargine (LANTUS) 100 UNIT/ML injection Inject 0.25 mLs (25 Units total) into the skin daily. 04/07/19   Mercy Riding, MD  nicotine (NICODERM CQ - DOSED IN MG/24 HOURS) 21 mg/24hr patch Place 1 patch (21 mg total) onto the skin daily. 04/08/19   Mercy Riding, MD  senna-docusate (SENOKOT-S) 8.6-50 MG tablet Take 1 tablet by mouth 2 (two) times daily as needed for moderate constipation. 04/07/19   Mercy Riding, MD    Scheduled Meds:  amLODipine  5 mg Oral Daily   atorvastatin  40 mg Oral q1800   enoxaparin (LOVENOX) injection  40 mg Subcutaneous Q24H   ferrous sulfate  325 mg Oral BID WC   gabapentin  400 mg Oral 3 times per day   gabapentin  600 mg Oral QHS   guaiFENesin  10 mL Oral Q6H   insulin aspart  0-5 Units Subcutaneous QHS   insulin aspart  0-9 Units Subcutaneous TID WC   insulin aspart  3 Units Subcutaneous TID WC   insulin glargine  7 Units Subcutaneous BID   lisinopril  2.5 mg Oral Daily   LORazepam  1 mg Intravenous Once    neomycin-polymyxin-hydrocortisone  4 drop Right EAR Q8H   nicotine  21 mg Transdermal Daily   senna-docusate  2 tablet Oral BID   Infusions:  sodium chloride Stopped (04/02/19 1000)   PRN Meds: sodium chloride, acetaminophen, alum & mag hydroxide-simeth, ondansetron (ZOFRAN) IV, oxyCODONE-acetaminophen   Allergies as of 03/30/2019 - Review Complete 03/15/2019  Allergen Reaction Noted   Codeine Itching and Hives 01/21/2019  Family History  Problem Relation Age of Onset   Diabetes Father    Kidney disease Father    Arthritis Mother    Heart disease Mother        CAD   Colon cancer Neg Hx    Esophageal cancer Neg Hx    Inflammatory bowel disease Neg Hx    Liver disease Neg Hx    Pancreatic cancer Neg Hx    Rectal cancer Neg Hx    Stomach cancer Neg Hx     Social History   Socioeconomic History   Marital status: Single    Spouse name: Not on file   Number of children: 2   Years of education: 14   Highest education level: Associate degree: occupational, Hotel manager, or vocational program  Occupational History    Employer: UNEMPLOYED    Comment: disabled  Tobacco Use   Smoking status: Current Every Day Smoker    Packs/day: 1.00    Years: 51.00    Pack years: 51.00    Types: Cigarettes   Smokeless tobacco: Former Systems developer    Types: Chew    Quit date: 1985  Substance and Sexual Activity   Alcohol use: No    Alcohol/week: 0.0 standard drinks    Comment: 08/14/14 none   Drug use: Yes    Types: Marijuana, Heroin, Cocaine, "Crack" cocaine, Fentanyl, Methylphenidate    Comment: s/p rehab x 6, occasional drug use (per pt last  use was 06/2015)   Sexual activity: Not Currently  Other Topics Concern   Not on file  Social History Narrative   Lives in boarding house/homeless   Ongoing occ drug use-crack   Single, disabled, prev mental health drug counselor.    Social Determinants of Health   Financial Resource Strain: High Risk   Difficulty of  Paying Living Expenses: Very hard  Food Insecurity: Landscape architect Present   Worried About Charity fundraiser in the Last Year: Often true   Arboriculturist in the Last Year: Often true  Transportation Needs: Public librarian (Medical): Yes   Lack of Transportation (Non-Medical): Yes  Physical Activity: Inactive   Days of Exercise per Week: 0 days   Minutes of Exercise per Session: 0 min  Stress: Stress Concern Present   Feeling of Stress : Very much  Social Connections: Severely Isolated   Frequency of Communication with Friends and Family: Never   Frequency of Social Gatherings with Friends and Family: Once a week   Attends Religious Services: Never   Marine scientist or Organizations: No   Attends Music therapist: Not asked   Marital Status: Never married  Human resources officer Violence: Not At Risk   Fear of Current or Ex-Partner: No   Emotionally Abused: No   Physically Abused: No   Sexually Abused: No    REVIEW OF SYSTEMS: Constitutional: Weakness.  Debilitated. ENT:  No nose bleeds Pulm: Currently no trouble breathing at rest.  Occasional productive cough. CV:  No palpitations, no LE edema.  GU:  No hematuria, no frequency GI: See HPI. Heme: No unusual bleeding or bruising. Transfusions: No epic record of previous blood transfusions. Neuro:  No seizures.  No syncope. Derm:  No itching, no rash or sores.  Endocrine:  No sweats or chills.  No polyuria or dysuria Immunization: Reviewed. Travel:  None.    PHYSICAL EXAM: Vital signs in last 24 hours: Vitals:   04/20/19 1934 04/21/19 0345  BP:  129/69 (!) 115/50  Pulse: 70 68  Resp: 17 16  Temp: 98.2 F (36.8 C) 98.2 F (36.8 C)  SpO2: 100% 96%   Wt Readings from Last 3 Encounters:  04/21/19 69.6 kg  03/15/19 74.8 kg  03/14/19 74.8 kg    General: Unwell, sickly appearing, pale. Head: No facial asymmetry or swelling.  No signs of head  trauma. Eyes: No conjunctival pallor.  No scleral icterus. Ears: Not hard of hearing Nose: No discharge Mouth: Poor dentition.  Tongue midline.  Mucosa moist, pink, clear. Neck: No JVD, no masses, no thyromegaly Lungs: Diminished breath sounds globally.  A few coarse rales in the left base.  No dyspnea.  No cough during exam. Heart: RRR.  No MRG.  S1, S2 present. Abdomen: Soft.  Not distended or tender.  No HSM, masses, bruits, hernias..   Rectal: Deferred Musc/Skeltl: No joint redness, swelling or gross deformities.  Right hallux amputation. Extremities: No CCE. Neurologic: Alert.  Oriented x3.  Moves all 4 limbs, strength not tested. Skin: No rash, no sores. Tattoos: Colorful tattoo on right arm, professional quality. Nodes: No cervical adenopathy Psych: Operative, pleasant, fluid speech.  Intake/Output from previous day: 01/05 0701 - 01/06 0700 In: 440 [P.O.:440] Out: -  Intake/Output this shift: No intake/output data recorded.  LAB RESULTS: Recent Labs    04/19/19 0551  WBC 6.9  HGB 10.3*  HCT 32.6*  PLT 425*   BMET Lab Results  Component Value Date   NA 136 04/19/2019   NA 134 (L) 04/18/2019   NA 139 04/12/2019   K 4.8 04/19/2019   K 5.6 (H) 04/18/2019   K 4.8 04/12/2019   CL 105 04/19/2019   CL 101 04/18/2019   CL 103 04/12/2019   CO2 21 (L) 04/19/2019   CO2 23 04/18/2019   CO2 26 04/12/2019   GLUCOSE 202 (H) 04/19/2019   GLUCOSE 388 (H) 04/18/2019   GLUCOSE 127 (H) 04/12/2019   BUN 24 (H) 04/19/2019   BUN 25 (H) 04/18/2019   BUN 24 (H) 04/12/2019   CREATININE 1.29 (H) 04/19/2019   CREATININE 1.40 (H) 04/18/2019   CREATININE 1.53 (H) 04/12/2019   CALCIUM 9.2 04/19/2019   CALCIUM 9.2 04/18/2019   CALCIUM 9.5 04/12/2019   LFT No results for input(s): PROT, ALBUMIN, AST, ALT, ALKPHOS, BILITOT, BILIDIR, IBILI in the last 72 hours. PT/INR Lab Results  Component Value Date   INR 0.9 11/11/2018   INR 1.1 08/26/2018   INR 1.0 08/23/2018   Hepatitis  Panel No results for input(s): HEPBSAG, HCVAB, HEPAIGM, HEPBIGM in the last 72 hours. C-Diff No components found for: CDIFF Lipase     Component Value Date/Time   LIPASE 17 08/23/2018 1100    Drugs of Abuse     Component Value Date/Time   LABOPIA NONE DETECTED 11/11/2018 0120   COCAINSCRNUR NONE DETECTED 11/11/2018 0120   COCAINSCRNUR POS (A) 05/05/2009 2018   LABBENZ NONE DETECTED 11/11/2018 0120   LABBENZ NEG 05/05/2009 2018   AMPHETMU NONE DETECTED 11/11/2018 0120   THCU NONE DETECTED 11/11/2018 0120   LABBARB NONE DETECTED 11/11/2018 0120     RADIOLOGY STUDIES: DG ESOPHAGUS W SINGLE CM (SOL OR THIN BA)  Result Date: 04/21/2019 CLINICAL DATA:  64 year old male with difficulty swallowing food and pills, which consistently results in vomiting. EXAM: ESOPHOGRAM/BARIUM SWALLOW TECHNIQUE: Combined double contrast and single contrast examination performed using effervescent crystals, thick barium liquid, and thin barium liquid. FLUOROSCOPY TIME:  Fluoroscopy Time:  2 minutes 18 seconds Radiation  Exposure Index (if provided by the fluoroscopic device): Number of Acquired Spot Images: 0 COMPARISON:  chest CT 03/31/2019. CT Abdomen and Pelvis 11/11/2018 and earlier. FINDINGS: A double contrast study was undertaken and the patient tolerated this well and without difficulty. There is no obstruction to the forward flow of contrast through the esophagus and into the stomach. However, the distal 3rd of the esophagus appears consistently abnormal. Shortly below the level of the carina, there is an abrupt change in the caliber and mucosal appearance of the thoracic esophagus (series 1, image 46) which continues over an approximately 6 centimeter segment approaching the gastroesophageal junction. This segment remains nondistended and demonstrates circumferential mucosal irregularity and long segment narrowing to a minimum caliber estimated at 4 millimeters. A barium tablet was not administered. By  contrast to the proximal 2/3 of the thoracic esophagus has a normal caliber and mucosal pattern. The gastroesophageal junction distal to the abnormal segment also appears relatively normal. With dedicated imaging of the cervical esophagus silent aspiration of barium into the trachea and airway became apparent (series 9, image 7). The cervical esophagus itself appears normal. With prone swallows there was decreased esophageal motility, and absent motility in the abnormal distal segment (series 11, image 61). Grossly normal appearance of the stomach. No gastroesophageal reflux occurred spontaneously or was elicited. IMPRESSION: 1. Abnormal distal 3rd of the thoracic esophagus which is indeterminate for tumor versus scarring/stricture. That segment is circumferentially narrowed and irregular over a 6 cm length, with a lumen reduction down to about 4 mm diameter. Recommend Endoscopy. 2. The GE junction appears to remain normal. The cervical and more proximal thoracic esophagus appear normal. 3. This study is positive also for silent aspiration of barium into the trachea and lower airways. Electronically Signed   By: Genevie Ann M.D.   On: 04/21/2019 08:49      IMPRESSION:   *    Dysphagia. Esophagram demonstrating abnormal distal esophagus with narrowing, irregularity, silent aspiration. History of significant esophagitis, gastritis, duodenitis at EGD in 08/2018.  Gastritis, duodentis resolved and esophagitis improved but significant benign esoph stenosis on 11/2018 EGD.  Has not been taking PPI as outpt and it is not ordered since admission.    Not surprising he has dysphagia.  *   Normocytic anemia.  Iron 20 (low), iron sat 6% (low), ferritin 36 (low normal).  On po iron.    *   CAP, sepsis, labile IDDM, debility.  Unable to find SNF placement in homeless, substance abusing pt.    *   Hep C. s/p treatment.  Undetectable virus in 04/2017.  Latest liver imaging is 11/02/18 CTAP w contrast showing hepatic  hemangioma but no liver fibrosis, fatty changes or cirrhosis.  *    Homelessness which makes for challenging medical care, SNF placement and follow-up.    PLAN:     *    EGD, dilation 0830 tomorrow.   Full  Liquids today, NPO after MN.   Added Protonix 40 po bid.      Azucena Freed  04/21/2019, 9:08 AM Phone [phone]  GI ATTENDING  History, laboratories, x-rays, prior endoscopy reports and pathology reviewed personally.  Patient seen and examined.  Agree with comprehensive consultation note as outlined above.  Patient with multiple medical issues as outlined above.  We are asked to see him for dysphagia.  He has severe erosive esophagitis with esophageal stricturing.  As well history of peptic ulcer disease.  Social issues have resulted in medical noncompliance.  Very difficult case.  Plan for upper endoscopy tomorrow.  I suspect he will have severe esophagitis.  May or may not have significant benefit from esophageal dilation, though will provide such if indicated.  Long-term his best chance for success is to get on twice daily PPI indefinitely.  I would recommend that the hospitalist involve social workers regarding the patient's ability to have medications on a regular basis.  This may decrease unnecessary hospitalizations and their associated cost.  Docia Chuck. Geri Seminole., M.D. [facility] Division of Gastroenterology

## 2019-04-21 NOTE — H&P (View-Only) (Signed)
[facility] Consult: 9:08 AM 04/21/2019  LOS: 21 days    Referring Provider: Dr Benny Lennert.    Primary Care Physician:  Patient, No Pcp Per Primary Gastroenterologist:  Dr Rush Landmark.      Reason for Consultation:  Dysphagia, abnormal  Esophagram   HPI: [name] is a 64 y.o. male.  PMH polysubstance abuse.  IDDM.  Hypertension.  PAD, s/p iliac stent..  Chronic pain.  Osteomyelitis, toe resection.  Peripheral neuropathy with signif issues walking.  Chronic hepatitis C, status post treatment, F0/F1 liver fibrosis.  Heroin overdose 09/2018.  Patient is homeless.   08/25/2018 EGD For evaluation of melena, coffee-ground emesis, FOBT positive.  Dr. Stefani Dama Roddy.  Severe esophagitis, black coloration in portions, biopsied.  Esophageal ulcers versus healing MW tear in distal esophagus.  Nonbleeding, clean-based gastric ulcers.  Discolored, friable, inflamed gastritis, question ischemic changes.  Erosive gastropathy, biopsied.  Erythematous to adenopathy. Path: Candida esophagitis.  Stains negative for HSV.  Nonspecific reactive gastropathy with erosion.  Stain negative for H. pylori.  Severe acute esophagitis with ulceration, differential DX includes drug/toxin induced injury, pancreaticobiliary reflux, ischemia can show similar histology and cannot be ruled out.  Patient never took prescribed bid PPI initially but finally filled Rx and took PPI as of July. However leading up to this current hospitalization he was not taking any meds so it is unclear when he last used PPI. 11/16/2018 EGD for esophagitis follow-up and c/o /dysphagia..  Dr Hilarie Fredrickson.  Severe esophagitis was biopsied.  Benign-appearing esophageal stenosis, 8 mm diameter at narrowest.  Passage of the endoscope resulted in multiple mucosal rents with self-limited  bleeding, no other dilations were performed.  Normal stomach.  Single, nonbleeding duodenal AVM.  Path: Reactive squamous mucosa, marked acute inflammation, ulcer.  Stains negative for HSV-1, HSV-2, CMV.  Patient says he had a short period of improved swallowing after the EGD, but the solid and sometimes liquid food dysphagia quickly recurred.  He ends up regurgitating.  Sometimes he can get the food to go down if he eats slowly of soft or liquid texture foods. Patient canceled EGD set for 12/16/2018 and no showed for office visit on 11/24/2018.  Admitted 3 weeks ago with CAP, sepsis, otitis media.  Completed Rocephin/azithromycin.  Has had labile blood sugars, generalized weakness, ambulatory dysfunction.  PT/OT recommending SNF placement but finding a bed has been challenging due to his behavioral disturbances, substance abuse and homeless status.  Resulting in the prolonged hospitalization. Yesterday morning he complained to the hospitalist that the dysphagia had gotten acutely worse and food is getting stuck in his esophagus. Ba esophagram demonstrates 6 cm segment of narrowing and irregularity in the distal esophagus.  Lumen reduced to 4 mm diameter.  GE junction itself appears normal.  Silent aspiration of barium to trachea and lower airways during study.  Stable, normocytic anemia, Hb 10.3. Albumin low but otherwise normal LFTs. Improving AKI.  Patient informs me he has not used crack cocaine in the in several months and has not used opiates/heroin in a few months.  No alcohol.  He has been living in [employer].   Past Medical History:  Diagnosis Date  . DEPRESSION   . DIABETES MELLITUS, TYPE I   . DRUG ABUSE    pt should have NO controlled substances rx'ed  . Glaucoma   . HEPATITIS C    chronic  . Heroin overdose ([place]) 10/03/2018  . Heroin use 10/03/2018  . Hypertension 02/18/2011  . 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 Vitiligo     Past Surgical History:  Procedure  Laterality Date  . ABDOMINAL AORTOGRAM W/LOWER EXTREMITY N/A 07/25/2016   Procedure: Abdominal Aortogram w/Bilateral Lower Extremity Runoff;  Surgeon: Conrad Moscow, MD;  Location: [place] [facility];  Service: Cardiovascular;  Laterality: N/A;  . ABDOMINAL AORTOGRAM W/LOWER EXTREMITY N/A 12/24/2016   Procedure: ABDOMINAL AORTOGRAM W/LOWER EXTREMITY;  Surgeon: Serafina Mitchell, MD;  Location: [place] [facility];  Service: Cardiovascular;  Laterality: N/A;  . AMPUTATION TOE Right 07/26/2016   Procedure: AMPUTATION GREAT TOE;  Surgeon: Serafina Mitchell, MD;  Location: [place];  Service: Vascular;  Laterality: Right;  . BIOPSY  08/25/2018   Procedure: BIOPSY;  Surgeon: Irving Copas., MD;  Location: [place];  Service: Gastroenterology;;  . BIOPSY  11/16/2018   Procedure: BIOPSY;  Surgeon: Jerene Bears, MD;  Location: [facility];  Service: Gastroenterology;;  . ESOPHAGOGASTRODUODENOSCOPY N/A 08/25/2018   Procedure: ESOPHAGOGASTRODUODENOSCOPY (EGD);  Surgeon: Irving Copas., MD;  Location: [place];  Service: Gastroenterology;  Laterality: N/A;  . ESOPHAGOGASTRODUODENOSCOPY (EGD) WITH PROPOFOL N/A 11/16/2018   Procedure: ESOPHAGOGASTRODUODENOSCOPY (EGD) WITH PROPOFOL;  Surgeon: Jerene Bears, MD;  Location: [facility] ENDOSCOPY;  Service: Gastroenterology;  Laterality: N/A;  . EYE SURGERY     retinal surgery x 2, right eye  . INSERTION OF ILIAC STENT Right 07/26/2016   Procedure: INSERTION OF RIGHT POPLITEAL STENT WITH BALLOON ANGIOPLASTY;  Surgeon: Serafina Mitchell, MD;  Location: [place];  Service: Vascular;  Laterality: Right;  . LOWER EXTREMITY ANGIOGRAM Right 07/26/2016   Procedure: LOWER EXTREMITY ANGIOGRAM;  Surgeon: Serafina Mitchell, MD;  Location: [place];  Service: Vascular;  Laterality: Right;  . PERIPHERAL VASCULAR BALLOON ANGIOPLASTY Right 07/26/2016   Procedure: PERIPHERAL VASCULAR BALLOON ANGIOPLASTY  RIGHT ANTERIOR TIBEAL ARTERY AND RIGHT SUPERFICIAL FEMORAL ARTERY;  Surgeon: Serafina Mitchell, MD;  Location: [place];  Service: Vascular;  Laterality: Right;  . PERIPHERAL VASCULAR INTERVENTION Right 12/24/2016   Procedure: PERIPHERAL VASCULAR INTERVENTION;  Surgeon: Serafina Mitchell, MD;  Location: [place] [facility];  Service: Cardiovascular;  Laterality: Right;  lower extr    Prior to Admission medications   Medication Sig Start Date End Date Taking? Authorizing Provider  Accu-Chek Softclix Lancets lancets Use to monitor glucose levels BID; E10.29 08/19/18  Yes Renato Shin, MD  Blood Glucose Calibration (ACCU-CHEK AVIVA) SOLN 1 each by In Vitro route as needed. Use to calibrate meter prn 08/19/18  Yes Renato Shin, MD  blood glucose meter kit and supplies KIT Dispense based on patient and insurance preference. Use up to four times daily as directed. (FOR ICD-9 250.00, 250.01). 08/15/18  Yes Mikhail, [place], DO  Blood Glucose Monitoring Suppl (ACCU-CHEK AVIVA PLUS) w/Device KIT 1 each by Does not apply route 2 (two) times a day. Use to monitor glucose levels BID; E10.29 08/19/18  Yes Renato Shin, MD  glucose blood (ACCU-CHEK AVIVA PLUS) test strip Use to monitor glucose levels BID; E10.29 08/19/18  Yes Renato Shin, MD  insulin aspart protamine- aspart (NOVOLOG MIX 70/30) (70-30) 100 UNIT/ML  injection Inject 0.15 mLs (15 Units total) into the skin 2 (two) times daily with a meal. 03/14/19  Yes Upstill, Shari, PA-C  Insulin Pen Needle 31G X 5 MM MISC Use with novolog flex pen 08/15/18  Yes Mikhail, [place], DO  acetaminophen (TYLENOL) 325 MG tablet Take 2 tablets (650 mg total) by mouth every 6 (six) hours as needed for mild pain or headache. 04/07/19   Mercy Riding, MD  amLODipine (NORVASC) 10 MG tablet Take 1 tablet (10 mg total) by mouth daily. 04/08/19   Mercy Riding, MD  atorvastatin (LIPITOR) 40 MG tablet Take 1 tablet (40 mg total) by mouth daily at 6 PM. 04/07/19   Gonfa, Charlesetta Ivory, MD  gabapentin (NEURONTIN) 300 MG capsule Take 1 capsule (300 mg total) by mouth 2 (two) times daily  at 10 am and 4 pm AND 3 capsules (900 mg total) at bedtime. 04/07/19   Mercy Riding, MD  insulin aspart (NOVOLOG) 100 UNIT/ML injection Glucose < 70: implement hypoglycemia protocol Glucose 70 - 120: 0 units Glucose 121 - 150: 2 units Glucose 151 - 200: 3 units Glucose 201 - 250: 5 units Glucose 251 - 300: 8 units Glucose 301 - 350: 11 units Glucose 351 - 400: 15 units and call your doctor or go to emergency department  5.  Correction coverage at bedtime (9pm) Glucose < 70: implement hypoglycemia protocol Glucose  70 - 120: 0 units Glucose 121 - 150: 0 units Glucose 151 - 200: 0 units Glucose 201 - 250: 2 units Glucose 251 - 300: 3 units Glucose 301 - 350: 4 units and call you doctor or go to emergency department 04/07/19   Mercy Riding, MD  insulin aspart (NOVOLOG) 100 UNIT/ML injection Inject 4 Units into the skin 3 (three) times daily with meals. If he eats greater than 50% of his meals 04/07/19   Wendee Beavers T, MD  insulin glargine (LANTUS) 100 UNIT/ML injection Inject 0.25 mLs (25 Units total) into the skin daily. 04/07/19   Mercy Riding, MD  nicotine (NICODERM CQ - DOSED IN MG/24 HOURS) 21 mg/24hr patch Place 1 patch (21 mg total) onto the skin daily. 04/08/19   Mercy Riding, MD  senna-docusate (SENOKOT-S) 8.6-50 MG tablet Take 1 tablet by mouth 2 (two) times daily as needed for moderate constipation. 04/07/19   Mercy Riding, MD    Scheduled Meds: . amLODipine  5 mg Oral Daily  . atorvastatin  40 mg Oral q1800  . enoxaparin (LOVENOX) injection  40 mg Subcutaneous Q24H  . ferrous sulfate  325 mg Oral BID WC  . gabapentin  400 mg Oral 3 times per day  . gabapentin  600 mg Oral QHS  . guaiFENesin  10 mL Oral Q6H  . insulin aspart  0-5 Units Subcutaneous QHS  . insulin aspart  0-9 Units Subcutaneous TID WC  . insulin aspart  3 Units Subcutaneous TID WC  . insulin glargine  7 Units Subcutaneous BID  . lisinopril  2.5 mg Oral Daily  . LORazepam  1 mg Intravenous Once  .  neomycin-polymyxin-hydrocortisone  4 drop Right EAR Q8H  . nicotine  21 mg Transdermal Daily  . senna-docusate  2 tablet Oral BID   Infusions: . sodium chloride Stopped (04/02/19 1000)   PRN Meds: sodium chloride, acetaminophen, alum & mag hydroxide-simeth, ondansetron (ZOFRAN) IV, oxyCODONE-acetaminophen   Allergies as of 03/30/2019 - Review Complete 03/15/2019  Allergen Reaction Noted  . Codeine Itching and Hives 01/21/2019  Family History  Problem Relation Age of Onset  . Diabetes Father   . Kidney disease Father   . Arthritis Mother   . Heart disease Mother        CAD  . Colon cancer Neg Hx   . Esophageal cancer Neg Hx   . Inflammatory bowel disease Neg Hx   . Liver disease Neg Hx   . Pancreatic cancer Neg Hx   . Rectal cancer Neg Hx   . Stomach cancer Neg Hx     Social History   Socioeconomic History  . Marital status: Single    Spouse name: Not on file  . Number of children: 2  . Years of education: 71  . Highest education level: Associate degree: occupational, Hotel manager, or vocational program  Occupational History    Employer: UNEMPLOYED    Comment: disabled  Tobacco Use  . Smoking status: Current Every Day Smoker    Packs/day: 1.00    Years: 51.00    Pack years: 51.00    Types: Cigarettes  . Smokeless tobacco: Former Systems developer    Types: Chew    Quit date: 1985  Substance and Sexual Activity  . Alcohol use: No    Alcohol/week: 0.0 standard drinks    Comment: 08/14/14 none  . Drug use: Yes    Types: Marijuana, Heroin, Cocaine, "Crack" cocaine, Fentanyl, Methylphenidate    Comment: s/p rehab x 6, occasional drug use (per pt last  use was 06/2015)  . Sexual activity: Not Currently  Other Topics Concern  . Not on file  Social History Narrative   Lives in boarding house/homeless   Ongoing occ drug use-crack   Single, disabled, prev mental health drug counselor.    Social Determinants of Health   Financial Resource Strain: High Risk  . Difficulty of  Paying Living Expenses: Very hard  Food Insecurity: Food Insecurity Present  . Worried About Charity fundraiser in the Last Year: Often true  . Ran Out of Food in the Last Year: Often true  Transportation Needs: Unmet Transportation Needs  . Lack of Transportation (Medical): Yes  . Lack of Transportation (Non-Medical): Yes  Physical Activity: Inactive  . Days of Exercise per Week: 0 days  . Minutes of Exercise per Session: 0 min  Stress: Stress Concern Present  . Feeling of Stress : Very much  Social Connections: Severely Isolated  . Frequency of Communication with Friends and Family: Never  . Frequency of Social Gatherings with Friends and Family: Once a week  . Attends Religious Services: Never  . Active Member of Clubs or Organizations: No  . Attends Archivist Meetings: Not asked  . Marital Status: Never married  Intimate Partner Violence: Not At Risk  . Fear of Current or Ex-Partner: No  . Emotionally Abused: No  . Physically Abused: No  . Sexually Abused: No    REVIEW OF SYSTEMS: Constitutional: Weakness.  Debilitated. ENT:  No nose bleeds Pulm: Currently no trouble breathing at rest.  Occasional productive cough. CV:  No palpitations, no LE edema.  GU:  No hematuria, no frequency GI: See HPI. Heme: No unusual bleeding or bruising. Transfusions: No epic record of previous blood transfusions. Neuro:  No seizures.  No syncope. Derm:  No itching, no rash or sores.  Endocrine:  No sweats or chills.  No polyuria or dysuria Immunization: Reviewed. Travel:  None.    PHYSICAL EXAM: Vital signs in last 24 hours: Vitals:   04/20/19 1934 04/21/19 0345  BP:  129/69 (!) 115/50  Pulse: 70 68  Resp: 17 16  Temp: 98.2 F (36.8 C) 98.2 F (36.8 C)  SpO2: 100% 96%   Wt Readings from Last 3 Encounters:  04/21/19 69.6 kg  03/15/19 74.8 kg  03/14/19 74.8 kg    General: Unwell, sickly appearing, pale. Head: No facial asymmetry or swelling.  No signs of head  trauma. Eyes: No conjunctival pallor.  No scleral icterus. Ears: Not hard of hearing Nose: No discharge Mouth: Poor dentition.  Tongue midline.  Mucosa moist, pink, clear. Neck: No JVD, no masses, no thyromegaly Lungs: Diminished breath sounds globally.  A few coarse rales in the left base.  No dyspnea.  No cough during exam. Heart: RRR.  No MRG.  S1, S2 present. Abdomen: Soft.  Not distended or tender.  No HSM, masses, bruits, hernias..   Rectal: Deferred Musc/Skeltl: No joint redness, swelling or gross deformities.  Right hallux amputation. Extremities: No CCE. Neurologic: Alert.  Oriented x3.  Moves all 4 limbs, strength not tested. Skin: No rash, no sores. Tattoos: Colorful tattoo on right arm, professional quality. Nodes: No cervical adenopathy Psych: Operative, pleasant, fluid speech.  Intake/Output from previous day: 01/05 0701 - 01/06 0700 In: 440 [P.O.:440] Out: -  Intake/Output this shift: No intake/output data recorded.  LAB RESULTS: Recent Labs    04/19/19 0551  WBC 6.9  HGB 10.3*  HCT 32.6*  PLT 425*   BMET Lab Results  Component Value Date   NA 136 04/19/2019   NA 134 (L) 04/18/2019   NA 139 04/12/2019   K 4.8 04/19/2019   K 5.6 (H) 04/18/2019   K 4.8 04/12/2019   CL 105 04/19/2019   CL 101 04/18/2019   CL 103 04/12/2019   CO2 21 (L) 04/19/2019   CO2 23 04/18/2019   CO2 26 04/12/2019   GLUCOSE 202 (H) 04/19/2019   GLUCOSE 388 (H) 04/18/2019   GLUCOSE 127 (H) 04/12/2019   BUN 24 (H) 04/19/2019   BUN 25 (H) 04/18/2019   BUN 24 (H) 04/12/2019   CREATININE 1.29 (H) 04/19/2019   CREATININE 1.40 (H) 04/18/2019   CREATININE 1.53 (H) 04/12/2019   CALCIUM 9.2 04/19/2019   CALCIUM 9.2 04/18/2019   CALCIUM 9.5 04/12/2019   LFT No results for input(s): PROT, ALBUMIN, AST, ALT, ALKPHOS, BILITOT, BILIDIR, IBILI in the last 72 hours. PT/INR Lab Results  Component Value Date   INR 0.9 11/11/2018   INR 1.1 08/26/2018   INR 1.0 08/23/2018   Hepatitis  Panel No results for input(s): HEPBSAG, HCVAB, HEPAIGM, HEPBIGM in the last 72 hours. C-Diff No components found for: CDIFF Lipase     Component Value Date/Time   LIPASE 17 08/23/2018 1100    Drugs of Abuse     Component Value Date/Time   LABOPIA NONE DETECTED 11/11/2018 0120   COCAINSCRNUR NONE DETECTED 11/11/2018 0120   COCAINSCRNUR POS (A) 05/05/2009 2018   LABBENZ NONE DETECTED 11/11/2018 0120   LABBENZ NEG 05/05/2009 2018   AMPHETMU NONE DETECTED 11/11/2018 0120   THCU NONE DETECTED 11/11/2018 0120   LABBARB NONE DETECTED 11/11/2018 0120     RADIOLOGY STUDIES: DG ESOPHAGUS W SINGLE CM (SOL OR THIN BA)  Result Date: 04/21/2019 CLINICAL DATA:  64 year old male with difficulty swallowing food and pills, which consistently results in vomiting. EXAM: ESOPHOGRAM/BARIUM SWALLOW TECHNIQUE: Combined double contrast and single contrast examination performed using effervescent crystals, thick barium liquid, and thin barium liquid. FLUOROSCOPY TIME:  Fluoroscopy Time:  2 minutes 18 seconds Radiation  Exposure Index (if provided by the fluoroscopic device): Number of Acquired Spot Images: 0 COMPARISON:  chest CT 03/31/2019. CT Abdomen and Pelvis 11/11/2018 and earlier. FINDINGS: A double contrast study was undertaken and the patient tolerated this well and without difficulty. There is no obstruction to the forward flow of contrast through the esophagus and into the stomach. However, the distal 3rd of the esophagus appears consistently abnormal. Shortly below the level of the carina, there is an abrupt change in the caliber and mucosal appearance of the thoracic esophagus (series 1, image 46) which continues over an approximately 6 centimeter segment approaching the gastroesophageal junction. This segment remains nondistended and demonstrates circumferential mucosal irregularity and long segment narrowing to a minimum caliber estimated at 4 millimeters. A barium tablet was not administered. By  contrast to the proximal 2/3 of the thoracic esophagus has a normal caliber and mucosal pattern. The gastroesophageal junction distal to the abnormal segment also appears relatively normal. With dedicated imaging of the cervical esophagus silent aspiration of barium into the trachea and airway became apparent (series 9, image 7). The cervical esophagus itself appears normal. With prone swallows there was decreased esophageal motility, and absent motility in the abnormal distal segment (series 11, image 61). Grossly normal appearance of the stomach. No gastroesophageal reflux occurred spontaneously or was elicited. IMPRESSION: 1. Abnormal distal 3rd of the thoracic esophagus which is indeterminate for tumor versus scarring/stricture. That segment is circumferentially narrowed and irregular over a 6 cm length, with a lumen reduction down to about 4 mm diameter. Recommend Endoscopy. 2. The GE junction appears to remain normal. The cervical and more proximal thoracic esophagus appear normal. 3. This study is positive also for silent aspiration of barium into the trachea and lower airways. Electronically Signed   By: Genevie Ann M.D.   On: 04/21/2019 08:49      IMPRESSION:   *    Dysphagia. Esophagram demonstrating abnormal distal esophagus with narrowing, irregularity, silent aspiration. History of significant esophagitis, gastritis, duodenitis at EGD in 08/2018.  Gastritis, duodentis resolved and esophagitis improved but significant benign esoph stenosis on 11/2018 EGD.  Has not been taking PPI as outpt and it is not ordered since admission.    Not surprising he has dysphagia.  *   Normocytic anemia.  Iron 20 (low), iron sat 6% (low), ferritin 36 (low normal).  On po iron.    *   CAP, sepsis, labile IDDM, debility.  Unable to find SNF placement in homeless, substance abusing pt.    *   Hep C. s/p treatment.  Undetectable virus in 04/2017.  Latest liver imaging is 11/02/18 CTAP w contrast showing hepatic  hemangioma but no liver fibrosis, fatty changes or cirrhosis.  *    Homelessness which makes for challenging medical care, SNF placement and follow-up.    PLAN:     *    EGD, dilation 0830 tomorrow.   Full  Liquids today, NPO after MN.   Added Protonix 40 po bid.      Azucena Freed  04/21/2019, 9:08 AM Phone [phone]  GI ATTENDING  History, laboratories, x-rays, prior endoscopy reports and pathology reviewed personally.  Patient seen and examined.  Agree with comprehensive consultation note as outlined above.  Patient with multiple medical issues as outlined above.  We are asked to see him for dysphagia.  He has severe erosive esophagitis with esophageal stricturing.  As well history of peptic ulcer disease.  Social issues have resulted in medical noncompliance.  Very difficult case.  Plan for upper endoscopy tomorrow.  I suspect he will have severe esophagitis.  May or may not have significant benefit from esophageal dilation, though will provide such if indicated.  Long-term his best chance for success is to get on twice daily PPI indefinitely.  I would recommend that the hospitalist involve social workers regarding the patient's ability to have medications on a regular basis.  This may decrease unnecessary hospitalizations and their associated cost.  Docia Chuck. Geri Semin[place]., M.D. [facility] Division of Gastroenterology

## 2019-04-22 ENCOUNTER — Encounter (HOSPITAL_COMMUNITY)
Admission: EM | Disposition: A | Discharge status: Home / Self Care | Payer: Self-pay | Source: Home / Self Care | Attending: Internal Medicine

## 2019-04-22 ENCOUNTER — Inpatient Hospital Stay (HOSPITAL_COMMUNITY): Payer: Medicare HMO | Admitting: Anesthesiology

## 2019-04-22 ENCOUNTER — Encounter (HOSPITAL_COMMUNITY): Payer: Self-pay | Admitting: Internal Medicine

## 2019-04-22 DIAGNOSIS — K2210 Ulcer of esophagus without bleeding: Secondary | ICD-10-CM

## 2019-04-22 DIAGNOSIS — J189 Pneumonia, unspecified organism: Secondary | ICD-10-CM

## 2019-04-22 DIAGNOSIS — A419 Sepsis, unspecified organism: Principal | ICD-10-CM

## 2019-04-22 DIAGNOSIS — K222 Esophageal obstruction: Secondary | ICD-10-CM

## 2019-04-22 HISTORY — PX: ESOPHAGOGASTRODUODENOSCOPY (EGD) WITH PROPOFOL: SHX5813

## 2019-04-22 HISTORY — PX: BALLOON DILATION: SHX5330

## 2019-04-22 LAB — CBC WITH DIFFERENTIAL/PLATELET
Abs Immature Granulocytes: 0.02 10*3/uL (ref 0.00–0.07)
Basophils Absolute: 0.1 10*3/uL (ref 0.0–0.1)
Basophils Relative: 2 %
Eosinophils Absolute: 0.3 10*3/uL (ref 0.0–0.5)
Eosinophils Relative: 6 %
HCT: 32.8 % — ABNORMAL LOW (ref 39.0–52.0)
Hemoglobin: 10.5 g/dL — ABNORMAL LOW (ref 13.0–17.0)
Immature Granulocytes: 0 %
Lymphocytes Relative: 34 %
Lymphs Abs: 1.9 10*3/uL (ref 0.7–4.0)
MCH: 27 pg (ref 26.0–34.0)
MCHC: 32 g/dL (ref 30.0–36.0)
MCV: 84.3 fL (ref 80.0–100.0)
Monocytes Absolute: 0.5 10*3/uL (ref 0.1–1.0)
Monocytes Relative: 10 %
Neutro Abs: 2.8 10*3/uL (ref 1.7–7.7)
Neutrophils Relative %: 48 %
Platelets: 387 10*3/uL (ref 150–400)
RBC: 3.89 MIL/uL — ABNORMAL LOW (ref 4.22–5.81)
RDW: 16.9 % — ABNORMAL HIGH (ref 11.5–15.5)
WBC: 5.7 10*3/uL (ref 4.0–10.5)
nRBC: 0 % (ref 0.0–0.2)

## 2019-04-22 LAB — BASIC METABOLIC PANEL
Anion gap: 4 — ABNORMAL LOW (ref 5–15)
BUN: 22 mg/dL (ref 8–23)
CO2: 29 mmol/L (ref 22–32)
Calcium: 9.3 mg/dL (ref 8.9–10.3)
Chloride: 104 mmol/L (ref 98–111)
Creatinine, Ser: 1.33 mg/dL — ABNORMAL HIGH (ref 0.61–1.24)
GFR calc Af Amer: >60 mL/min (ref >60)
GFR calc non Af Amer: 57 mL/min — ABNORMAL LOW (ref >60)
Glucose, Bld: 188 mg/dL — ABNORMAL HIGH (ref 70–99)
Potassium: 4.7 mmol/L (ref 3.5–5.1)
Sodium: 137 mmol/L (ref 135–145)

## 2019-04-22 LAB — GLUCOSE, CAPILLARY
Glucose-Capillary: 135 mg/dL — ABNORMAL HIGH (ref 70–99)
Glucose-Capillary: 161 mg/dL — ABNORMAL HIGH (ref 70–99)
Glucose-Capillary: 124 mg/dL — ABNORMAL HIGH (ref 70–99)
Glucose-Capillary: 106 mg/dL — ABNORMAL HIGH (ref 70–99)

## 2019-04-22 SURGERY — ESOPHAGOGASTRODUODENOSCOPY (EGD) WITH PROPOFOL
Anesthesia: Monitor Anesthesia Care

## 2019-04-22 MED ORDER — FERROUS SULFATE 325 (65 FE) MG PO TABS: 325.0000 mg | ORAL_TABLET | Freq: Every day | ORAL | 0 refills | Status: AC

## 2019-04-22 MED ORDER — INSULIN PEN NEEDLE 31G X 5 MM MISC: 0 refills | Status: AC

## 2019-04-22 MED ORDER — INSULIN GLARGINE 100 UNIT/ML ~~LOC~~ SOLN: 25.0000 [IU] | Freq: Every day | SUBCUTANEOUS | 11 refills | Status: DC

## 2019-04-22 MED ORDER — ACCU-CHEK SOFTCLIX LANCETS MISC: 12 refills | Status: AC

## 2019-04-22 MED ORDER — INSULIN ASPART 100 UNIT/ML ~~LOC~~ SOLN: SUBCUTANEOUS | 11 refills | Status: AC

## 2019-04-22 MED ORDER — ACCU-CHEK AVIVA PLUS VI STRP: ORAL_STRIP | 12 refills | Status: AC

## 2019-04-22 MED ORDER — HYDROXYZINE HCL 25 MG PO TABS
25.0000 mg | ORAL_TABLET | Freq: Four times a day (QID) | ORAL | Status: AC | PRN
  Administered 2019-04-22: 25 mg via ORAL
  Filled 2019-04-22: qty 1

## 2019-04-22 MED ORDER — LISINOPRIL 2.5 MG PO TABS: 2.5000 mg | ORAL_TABLET | Freq: Every day | ORAL | 0 refills | Status: DC

## 2019-04-22 MED ORDER — AMLODIPINE BESYLATE 10 MG PO TABS: 10.0000 mg | ORAL_TABLET | Freq: Every day | ORAL | 0 refills | Status: DC

## 2019-04-22 MED ORDER — ATORVASTATIN CALCIUM 40 MG PO TABS: 40.0000 mg | ORAL_TABLET | Freq: Every day | ORAL | 0 refills | Status: AC

## 2019-04-22 MED ORDER — OXYCODONE-ACETAMINOPHEN 5-325 MG PO TABS: 1.0000 | ORAL_TABLET | Freq: Four times a day (QID) | ORAL | 0 refills | Status: DC | PRN

## 2019-04-22 MED ORDER — PROPOFOL 500 MG/50ML IV EMUL
INTRAVENOUS | Status: DC | PRN
  Administered 2019-04-22: 80 ug/kg/min via INTRAVENOUS

## 2019-04-22 MED ORDER — SENNOSIDES-DOCUSATE SODIUM 8.6-50 MG PO TABS: 2.0000 | ORAL_TABLET | Freq: Two times a day (BID) | ORAL | 0 refills | Status: AC

## 2019-04-22 MED ORDER — GABAPENTIN 300 MG PO CAPS: ORAL_CAPSULE | ORAL | 0 refills | Status: AC

## 2019-04-22 MED ORDER — PANTOPRAZOLE SODIUM 40 MG PO TBEC: 40.0000 mg | DELAYED_RELEASE_TABLET | Freq: Two times a day (BID) | ORAL | 0 refills | Status: AC

## 2019-04-22 MED ORDER — INSULIN ASPART 100 UNIT/ML ~~LOC~~ SOLN: 4.0000 [IU] | Freq: Three times a day (TID) | SUBCUTANEOUS | 11 refills | Status: DC

## 2019-04-22 MED ORDER — INSULIN GLARGINE 100 UNIT/ML ~~LOC~~ SOLN: 7.0000 [IU] | Freq: Two times a day (BID) | SUBCUTANEOUS | 11 refills | Status: DC

## 2019-04-22 MED ORDER — LACTATED RINGERS IV SOLN: INTRAVENOUS | Status: DC

## 2019-04-22 MED FILL — LISINOPRIL 2.5 MG TABLET: 2.5 | 30 days supply | Qty: 30 | Fill #0

## 2019-04-22 MED FILL — ACCU-CHEK SOFTCLIX LANCETS: 30 days supply | Qty: 100 | Fill #0

## 2019-04-22 MED FILL — ATORVASTATIN CALCIUM 40 MG: 40 | 30 days supply | Qty: 30 | Fill #0

## 2019-04-22 MED FILL — PENTIPS 31G X 8 MM MISC: 31G X 8 MM | 30 days supply | Qty: 100 | Fill #0

## 2019-04-22 MED FILL — ACCU-CHEK AVIVA PLUS TEST S: 30 days supply | Qty: 100 | Fill #0

## 2019-04-22 MED FILL — PANTOPRAZOLE SOD DR 40 MG T: 40 | 30 days supply | Qty: 60 | Fill #0

## 2019-04-22 MED FILL — FERROUS SULFATE 325 MG TAB: 325 (65 FE) | 30 days supply | Qty: 30 | Fill #0

## 2019-04-22 MED FILL — LANTUS SOLOSTAR 100 UNITS/M: 100 | 30 days supply | Qty: 6 | Fill #0

## 2019-04-22 MED FILL — SENEXON-S 8.6-50 MG TABS: 8.6-50 | 30 days supply | Qty: 120 | Fill #0

## 2019-04-22 MED FILL — AMLODIPINE BESYLATE 10 MG T: 10 | 30 days supply | Qty: 30 | Fill #0

## 2019-04-22 MED FILL — NOVOLOG FLEXPEN SYRINGE: 100 | 30 days supply | Qty: 15 | Fill #0

## 2019-04-22 MED FILL — GABAPENTIN 300 MG CAPSULE: 300 | 30 days supply | Qty: 150 | Fill #0

## 2019-04-22 SURGICAL SUPPLY — 15 items

## 2019-04-22 NOTE — Anesthesia Procedure Notes (Signed)
Procedure Name: MAC Date/Time: 04/22/2019 8:35 AM Performed by: Lavell Luster, CRNA Pre-anesthesia Checklist: Patient identified, Emergency Drugs available, Suction available, Patient being monitored and Timeout performed Patient Re-evaluated:Patient Re-evaluated prior to induction Oxygen Delivery Method: Nasal cannula Induction Type: IV induction Placement Confirmation: breath sounds checked- equal and bilateral and positive ETCO2

## 2019-04-22 NOTE — TOC Progression Note (Addendum)
Transition of Care [facility]) - Progression Note    Patient Details  Name: [name] MRN: [id number] Date of Birth: 1955/09/26  Transition of Care [facility]) CM/SW [place], [place] Phone [phone] 04/22/2019, 12:25 PM  Clinical Narrative:     CSW and RNCM met with patient's RN at bedside with patient for discharge planning. Informed patient of no SNF options, patient provided with shelter resource packet and informed of [facility] hotel program. Patient verbally agreed to discharge plan of [facility] hotel program tomorrow. MD informed of discharge plan, patient must arrive at [facility] between 8am and 1pm.   Expected Discharge Plan: shelter Barriers to Discharge: Homeless with medical needs, SNF Pending bed offer  Expected Discharge Plan and Services Expected Discharge Plan: [place] arrangements for the past 2 months: Hotel/Motel([employer] motel)                                       Social Determinants of Health (SDOH) Interventions    Readmission Risk Interventions Readmission Risk Prevention Plan 08/12/2018  Transportation Screening Complete  Medication Review Press photographer) Complete  PCP or Specialist appointment within 3-5 days of discharge Complete  HRI or [place] Complete  SW Recovery Care/Counseling Consult Complete  [place] Not Applicable  Some recent data might be hidden

## 2019-04-22 NOTE — Transfer of Care (Signed)
Immediate Anesthesia Transfer of Care Note  Patient: [name]  Procedure(s) Performed: ESOPHAGOGASTRODUODENOSCOPY (EGD) WITH PROPOFOL (N/A ) BALLOON DILATION (N/A )  Patient Location: Endoscopy Unit  Anesthesia Type:MAC  Level of Consciousness: awake, alert  and sedated  Airway & Oxygen Therapy: Patient connected to nasal cannula oxygen  Post-op Assessment: Post -op Vital signs reviewed and stable  Post vital signs: stable  Last Vitals:  Vitals Value Taken Time  BP    Temp    Pulse    Resp    SpO2      Last Pain:  Vitals:   04/22/19 0743  TempSrc: Oral  PainSc: 6       Patients Stated Pain Goal: 2 (01/77/93 9030)  Complications: No apparent anesthesia complications

## 2019-04-22 NOTE — Progress Notes (Signed)
Patient refuses bed alarm and to use urinal. Will continue to monitor for bathroom assistance and safety.

## 2019-04-22 NOTE — Progress Notes (Signed)
Patient stated that he needs clothes at discharge

## 2019-04-22 NOTE — Progress Notes (Signed)
PROGRESS NOTE  [name] ZOX:[id number] DOB: [birth year] DOA: 03/30/2019 PCP: Patient, No Pcp Per  Brief History   64 year old homeless male with history of IDDM-1, HTN, chronic hepatitis C, polysubstance abuse, and chronic pain presented with generalized weakness, nausea and poor p.o. intake for 3 days and admitted for sepsis due to CAPandright acute otitis media. Completed course of ceftriaxone and azithromycin.  Hospital course complicated by labile blood sugars.  Decreased insulin doses.  Diabetes coordinator also following.  Generalized weakness.  PT OT recommended SNF.  CSW assisting with placement. Pt is awaiting placement. He has refused maintenanc of IV access and use of bed alarm.  Labile blood sugars, debility, ambulatory dysfunction, and lack of options for placement have caused for discharge to be unsafe at this time. Per TOC, finding SNF placement for this patient has been exceedingly difficult due to homelessness, substances abuse history and behaviors. He will be discharged to shelter with he is medically clear.  This morning the patient is complaining of a feeling that food gets stuck in his esophagus. He states that he has previously been diagnosed with an esophageal stricture and was to have followed up with Dr. Allison Quarry for dilatation. He is complaining that it is becoming more severe in the last day.  Esophagram demonstrated esophageal stricture and the study also documented silent aspiration. He underwent EGD this morning that demonstrated LA Grade D (one or more mucosal breaks involving at least 75% of esophageal circumference) esophagitis with bleeding was found 34 to 40 cm from the incisors. There were changes that suggest possible underlying Barrett's esophagus. Findings: One benign-appearing, intrinsic severe stenosis was found 40 cm from the incisors. This stenosis measured 8 mm (inner diameter). The stenosis was traversed after dilation. A TTS dilator was  passed through the scope. Dilation with a 12-13.5-15 mm balloon dilator was performed to 13.5 mm. The dilation site was examined and showed mild mucosal disruption and significant improvement in luminal diameter.  Consultants  . Gastroenterology  Procedures  . EGD  Antibiotics   Anti-infectives (From admission, onward)   Start     Dose/Rate Route Frequency Ordered Stop   04/01/19 0900  azithromycin (ZITHROMAX) 500 mg in sodium chloride 0.9 % 250 mL IVPB     500 mg 250 mL/hr over 60 Minutes Intravenous Every 24 hours 03/31/19 0822 04/05/19 0810   04/01/19 0800  cefTRIAXone (ROCEPHIN) 2 g in sodium chloride 0.9 % 100 mL IVPB     2 g 200 mL/hr over 30 Minutes Intravenous Every 24 hours 03/31/19 0822 04/05/19 0811   03/31/19 0615  cefTRIAXone (ROCEPHIN) 1 g in sodium chloride 0.9 % 100 mL IVPB     1 g 200 mL/hr over 30 Minutes Intravenous  Once 03/31/19 0610 03/31/19 0935   03/31/19 0615  azithromycin (ZITHROMAX) 500 mg in sodium chloride 0.9 % 250 mL IVPB     500 mg 250 mL/hr over 60 Minutes Intravenous  Once 03/31/19 0610 03/31/19 1211   03/31/19 0245  doxycycline (VIBRA-TABS) tablet 100 mg     100 mg Oral  Once 03/31/19 0232 03/31/19 0408      Subjective  The patient is resting comfortably. No new complaints.  Objective   Vitals:  Vitals:   04/22/19 0935 04/22/19 0945  BP: 121/64 138/69  Pulse: 65 65  Resp: 12 16  Temp:    SpO2: 95% 100%   Exam:  Constitutional:  . The patient is awake, alert, and oriented x 3. No acute distress. Respiratory:  .  No increased work of breathing. . No wheezes, rales, or rhonchi . No tactile fremitus Cardiovascular:  . Regular rate and rhythm . No murmurs, ectopy, or gallups. . No lateral PMI. No thrills. Abdomen:  . Abdomen is soft, non-tender, non-distended . No hernias, masses, or organomegaly . Normoactive bowel sounds.  Musculoskeletal:  . No cyanosis, clubbing, or edema Skin:  . No rashes, lesions, ulcers . palpation  of skin: no induration or nodules Neurologic:  . CN 2-12 intact . Sensation all 4 extremities intact Psychiatric:  . Mental status o Mood, affect appropriate o Orientation to person, place, time  . judgment and insight appear intact  I have personally reviewed the following:   Today's Data  . Vitals, Glucoses, CBC  Scheduled Meds: . amLODipine  5 mg Oral Daily  . atorvastatin  40 mg Oral q1800  . ferrous sulfate  325 mg Oral BID WC  . gabapentin  400 mg Oral 3 times per day  . gabapentin  600 mg Oral QHS  . guaiFENesin  10 mL Oral Q6H  . insulin aspart  0-5 Units Subcutaneous QHS  . insulin aspart  0-9 Units Subcutaneous TID WC  . insulin aspart  3 Units Subcutaneous TID WC  . insulin glargine  7 Units Subcutaneous BID  . lisinopril  2.5 mg Oral Daily  . LORazepam  1 mg Intravenous Once  . neomycin-polymyxin-hydrocortisone  4 drop Right EAR Q8H  . nicotine  21 mg Transdermal Daily  . pantoprazole  40 mg Oral BID  . senna-docusate  2 tablet Oral BID   Continuous Infusions: . 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 Due to Pneumonia. The patient has completed a course of IV Ceftriaxone and Azithromycin for this. COVID-19 and RVP are negative. He is now saturating 94% on room air.  Labile and uncontrolled type 1 diabetes complicated by with diabetic neuropathy and hypoglycemia: The patient reports his blood sugar has been as high as 1500 and as low as 8!!!!  Hemoglobin A1c 9.1 , 04/01/2019.  On gabapentin for diabetic neuropathy. Hypoglycemic on 04/16/2018 afternoon at 54.   Now hyperglycemic with glucoses running from the  high 200's to low 400's. Diabetic coordinator also following.  Esophageal dysphagia: Esophagram demonstrated stricture and irregularity of the esophageal mucosa. GI has been consulted and EGD is planned for tomorrow. The patient will be on a full liquid diet until Midnight.   LA Grade D esophagitis with bleeding: GI has recommended for the patient to continue PPI BID indefinitely. Patient is to follow up with Dr. Early Osmond in 6-8 weeks for follow up EGD.  Esophageal Stricture: S/P Dilatation by GI. GI has recommended for the patient to continue PPI BID indefinitely. Patient is to follow up with Dr. Early Osmond in 6-8 weeks for follow up EGD.  Aspiration: Silent aspiration of barium was seen on Esophagram.  Full liquid diet. Pt is now s/p dilatation of esophageal stricture.  Peripheral neuropathy/ Diffuse joint pain. On gabapentin, increased gabapentin dose. On Percocet 1-2 tablets every 6 as needed for severe joint pain. Nursing has reported the patient hiding percocet's  in his sheets. Continue bowel regimen to avoid opiate-induced constipation.  Resolved intractable nausea with vomiting likely in the setting of of hypo-glycemia: IV Zofran as needed. Monitor glucoses.   Essential hypertension: Blood pressures under fair control on amlodipine and lisinopril. Monitor.  Chronic normocytic anemia: Hgb 11-13 (baseline)>10.2 (admit)>>9.4.   History of chronic hepatitis C: HCV RNA undetectable in 12/2016 and 04/2017  Tobacco use disorder: Reports smoking about a pack a day. The patient has had 7-10 minutes of tobacco cessation counseling. Nicotine patch has been offered.  Alcohol abuse in remission: Noted  Hyponatremia: Resolved. Monitor.  Hyperlipidemia continue statin.  AKI: Intravascular volume contracted, encourage to increase oral fluid intake.  Ambulatory dysfunction/physical debility: PT will be discharged to [place] vs hotel as soon as medically cleared.. SNF placement for this  patient has not been able to be secured.   Questionable fall: Per bedside RN Arlina Robes. Unwitnessed 04/15/2019.  No apparent injuries.  I have seen and examined this patient myself. I have spent 32 minutes in his evaluation and care.  DVT prophylaxis:Lovenox    Code Status:Full code Family Communication:None available. 2 estranged children in the [place] area. Disposition Plan:Discharge to shelter or hotel when patient is medically clear. No discharge to SNF is likely to become available due to homelessness, substances abuse history and behaviors. Anticipate discharge tomorrow.  Layn Kye, DO [name] Direct contact: see [email or web address]  7PM-7AM contact night coverage as above 04/22/2019, 3:58 PM  LOS: 17 days

## 2019-04-22 NOTE — Interval H&P Note (Signed)
History and Physical Interval Note:  04/22/2019 8:54 AM  [name]  has presented today for surgery, with the diagnosis of Dysphagia.  Esophagitis.  Esophageal stricture..  The various methods of treatment have been discussed with the patient and family. After consideration of risks, benefits and other options for treatment, the patient has consented to  Procedure(s): ESOPHAGOGASTRODUODENOSCOPY (EGD) WITH PROPOFOL (N/A) BALLOON DILATION (N/A) as a surgical intervention.  The patient's history has been reviewed, patient examined, no change in status, stable for surgery.  I have reviewed the patient's chart and labs.  Questions were answered to the patient's satisfaction.     Scarlette Shorts

## 2019-04-22 NOTE — Op Note (Signed)
[facility] Patient Name: [name] Procedure Date : 04/22/2019 MRN: [id number] Attending MD: Docia Chuck. Henrene Pastor , MD Date of Birth: [birth year] CSN: [id number] Age: 64 Admit Type: Inpatient Procedure:                Upper GI endoscopy with balloon dilation of the                            esophagus?"12 mm, 13.5 mm Indications:              Therapeutic procedure, Dysphagia Providers:                Docia Chuck. Henrene Pastor, MD, Grace Isaac, RN, William Dalton, Technician Referring MD:             [facility] Medicines:                Monitored Anesthesia Care Complications:            No immediate complications. Estimated Blood Loss:     Estimated blood loss: none. Procedure:                Pre-Anesthesia Assessment:                           - Prior to the procedure, a History and Physical                            was performed, and patient medications and                            allergies were reviewed. The patient's tolerance of                            previous anesthesia was also reviewed. The risks                            and benefits of the procedure and the sedation                            options and risks were discussed with the patient.                            All questions were answered, and informed consent                            was obtained. Prior Anticoagulants: The patient has                            taken no previous anticoagulant or antiplatelet                            agents. ASA Grade Assessment: II - A patient with  mild systemic disease. After reviewing the risks                            and benefits, the patient was deemed in                            satisfactory condition to undergo the procedure.                           After obtaining informed consent, the endoscope was                            passed under direct vision. Throughout the   procedure, the patient's blood pressure, pulse, and                            oxygen saturations were monitored continuously. The                            GIF-H190 ([id number]) Olympus gastroscope was                            introduced through the mouth, and advanced to the                            second part of duodenum. The upper GI endoscopy was                            accomplished without difficulty. The patient                            tolerated the procedure well. Scope In: Scope Out: Findings:      LA Grade D (one or more mucosal breaks involving at least 75% of       esophageal circumference) esophagitis with bleeding was found 34 to 40       cm from the incisors. There were changes that suggest possible       underlying Barrett's esophagus.      One benign-appearing, intrinsic severe stenosis was found 40 cm from the       incisors. This stenosis measured 8 mm (inner diameter). The stenosis was       traversed after dilation. A TTS dilator was passed through the scope.       Dilation with a 12-13.5-15 mm balloon dilator was performed to 13.5 mm.       The dilation site was examined and showed mild mucosal disruption and       significant improvement in luminal diameter.      The stomach was normal.      The examined duodenum was norma save one tiny AVM.      The cardia and gastric fundus were normal on retroflexion. Impression:               - LA Grade D reflux esophagitis with bleeding.                           - Benign-appearing esophageal stenosis. Dilated.                           -  Question underlying Barrett's esophagus                           - Normal stomach.                           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                            Otherwise normal examined duodenum.                           - No specimens collected. Moderate Sedation:      none Recommendation:           1. Soft diet                           2. Pantoprazole  40 mg twice daily indefinitely                           3. Outpatient upper endoscopy with Dr. Rush Landmark                            in 6 to 8 weeks to assess for mucosal healing,                            possible underlying Barrett's, and likely the need                            for repeat dilation.                           DISCUSSED WITH PATIENT. PATIENT PROVIDED WITH HIS                            ENDOSCOPY REPORT. Also provided with our office                            number [phone] to arrange repeat endoscopy.                           GI will sign off. Procedure Code(s):        --- Professional ---                           [phone], Esophagogastroduodenoscopy, flexible,                            transoral; with transendoscopic balloon dilation of                            esophagus (less than 30 mm diameter) Diagnosis Code(s):        --- Professional ---                           K21.01, Gastro-esophageal reflux disease with  esophagitis, with bleeding                           K22.2, Esophageal obstruction                           R13.10, Dysphagia, unspecified CPT copyright 2019 American Medical Association. All rights reserved. The codes documented in this report are preliminary and upon coder review may  be revised to meet current compliance requirements. Docia Chuck. Henrene Pastor, MD 04/22/2019 9:22:26 AM This report has been signed electronically. Number of Addenda: 0

## 2019-04-22 NOTE — Progress Notes (Signed)
Chaplain engaged in initial visit with [name] and obtained list of items that he wants retrieved.  Chaplain is working to have his items picked up from the [employer] [facility] motel by an Careers information officer.    Chaplain spoke with a [employer] clerk/attendant and she will leave note stating that items for [name] can be picked up by a volunteer.  Volunteer or chaplain will call ahead to let them know when he or she will arrive.   Chaplain is working to have this done before [employer].

## 2019-04-22 NOTE — Progress Notes (Signed)
Physical Therapy Treatment Patient Details Name: [name] MRN: [id number] DOB: 1956-02-25 Today's Date: 04/22/2019    History of Present Illness Pt is a 64 y/o male admitted 03/30/19 with generalized weakness, decreased PO intake, and nausea over the last 3 days. Found with sepsis secondary to community acquired pneumonia. S/p upper endoscopy 1/7. PMH includes DM, HTN, Hep C, polysubstance abuse, CKD III, homelessness.   PT Comments     Pt progressing with mobility. Trialled ambulation with rollator this session, pt tolerated well at supervision-level. Pt preparing for d/c to shelter or hotel tomorrow; if to remain admitted, will continue to follow acutely.  Follow Up Recommendations  SNF(no SNF bed offers, pt to d/c to shelter)     Equipment Recommendations  Other (rollator)    Recommendations for Other Services       Precautions / Restrictions Precautions Precautions: Fall Restrictions Weight Bearing Restrictions: No    Mobility  Bed Mobility Overal bed mobility: Independent                Transfers Overall transfer level: Modified independent Equipment used: 4-wheeled walker             General transfer comment: Cues for locking brakes; able to stand from bed, toilet and rollator seat  Ambulation/Gait Ambulation/Gait assistance: Supervision Gait Distance (Feet): 250 Feet Assistive device: 4-wheeled walker Gait Pattern/deviations: Step-through pattern;Decreased stride length;Decreased dorsiflexion - right;Decreased dorsiflexion - left;Trunk flexed   Gait velocity interpretation: 1.31 - 2.62 ft/sec, indicative of limited community ambulator General Gait Details: Slow, steady gait with rollator, supervision for safety due to fall risk; demonstrates good ability to compensate for bilateral decreased DF. Able to practice with rollator features   Stairs             Wheelchair Mobility    Modified Rankin (Stroke Patients Only)       Balance  Overall balance assessment: Needs assistance Sitting-balance support: Feet supported Sitting balance-Leahy Scale: Good       Standing balance-Leahy Scale: Fair Standing balance comment: Can static stand without UE support and min guard, reliant on UE support for dynamic stability                            Cognition Arousal/Alertness: Awake/alert Behavior During Therapy: WFL for tasks assessed/performed Overall Cognitive Status: No family/caregiver present to determine baseline cognitive functioning Area of Impairment: Attention;Memory;Problem solving                   Current Attention Level: Selective Memory: Decreased short-term memory       Problem Solving: Requires verbal cues General Comments: Some tangential with speech and decreased attention      Exercises      General Comments        Pertinent Vitals/Pain Pain Assessment: Faces Faces Pain Scale: Hurts a little bit Pain Location: Bilateral feet Pain Descriptors / Indicators: Discomfort Pain Intervention(s): Monitored during session    Home Living                      Prior Function            PT Goals (current goals can now be found in the care plan section) Progress towards PT goals: Progressing toward goals    Frequency    Min 3X/week      PT Plan Discharge plan needs to be updated;Frequency needs to be updated;Equipment recommendations need to be updated  Co-evaluation              AM-PAC PT "6 Clicks" Mobility   Outcome Measure  Help needed turning from your back to your side while in a flat bed without using bedrails?: None Help needed moving from lying on your back to sitting on the side of a flat bed without using bedrails?: None Help needed moving to and from a bed to a chair (including a wheelchair)?: None Help needed standing up from a chair using your arms (e.g., wheelchair or bedside chair)?: None Help needed to walk in hospital room?:  None Help needed climbing 3-5 steps with a railing? : A Little 6 Click Score: 23    End of Session   Activity Tolerance: Patient tolerated treatment well Patient left: in bed;with call bell/phone within reach Nurse Communication: Mobility status PT Visit Diagnosis: Unsteadiness on feet (R26.81);Muscle weakness (generalized) (M62.81);Difficulty in walking, not elsewhere classified (R26.2)     Time: 7026-3785 PT Time Calculation (min) (ACUTE ONLY): 27 min  Charges:  $Therapeutic Exercise: 8-22 mins $Therapeutic Activity: 8-22 mins                    Mabeline Caras, PT, DPT Acute Rehabilitation Services  Pager [phone] Office [phone]  Derry Lory 04/22/2019, 5:41 PM

## 2019-04-22 NOTE — Plan of Care (Signed)
Patient will be discharged tomorrow ,Medications will be delivered as well as needing clothes, shoes and coat. And [place] Ride to hotel [place].

## 2019-04-22 NOTE — Anesthesia Preprocedure Evaluation (Addendum)
Anesthesia Evaluation  Patient identified by MRN, date of birth, ID band Patient awake    Reviewed: Allergy & Precautions, H&P , NPO status , Patient's Chart, lab work & pertinent test results  Airway Mallampati: II   Neck ROM: full    Dental   Pulmonary COPD, Current Smoker,    breath sounds clear to auscultation       Cardiovascular hypertension, + Peripheral Vascular Disease   Rhythm:regular Rate:Normal     Neuro/Psych PSYCHIATRIC DISORDERS Depression    GI/Hepatic GERD  ,(+)     substance abuse  IV drug use, Hepatitis -, CEsophageal stricture   Endo/Other  diabetes, Type 1, Insulin Dependent  Renal/GU      Musculoskeletal   Abdominal   Peds  Hematology   Anesthesia Other Findings   Reproductive/Obstetrics                             Anesthesia Physical Anesthesia Plan  ASA: III  Anesthesia Plan: MAC   Post-op Pain Management:    Induction: Intravenous  PONV Risk Score and Plan: Propofol infusion and Treatment may vary due to age or medical condition  Airway Management Planned: Nasal Cannula  Additional Equipment:   Intra-op Plan:   Post-operative Plan:   Informed Consent: I have reviewed the patients History and Physical, chart, labs and discussed the procedure including the risks, benefits and alternatives for the proposed anesthesia with the patient or authorized representative who has indicated his/her understanding and acceptance.       Plan Discussed with: CRNA, Anesthesiologist and Surgeon  Anesthesia Plan Comments:         Anesthesia Quick Evaluation

## 2019-04-22 NOTE — Progress Notes (Signed)
OT Cancellation Note  Patient Details Name: [name] MRN: [id number] DOB: 12-14-1955   Cancelled Treatment:    Reason Eval/Treat Not Completed: Patient at procedure or test/ unavailable. Pt in OR at this time, will check back next available date/time as appropriate  Britt Bottom 04/22/2019, 8:58 AM

## 2019-04-22 NOTE — Progress Notes (Signed)
SLP Cancellation Note  Patient Details Name: [name] MRN: [id number] DOB: Dec 26, 1955   Cancelled treatment:       Reason Eval/Treat Not Completed: Patient at procedure or test/unavailable. SLP will continue efforts.  Of note, pt is known to ST services, having had 3 MBS since October 2018. Most recent MBS was performed in May 2020, with recommendation for dys 3 (mechanical soft) diet with thin liquids via CUP.   Carmela Rima, [place] Speech Language Pathologist Office: [phone] Pager: [phone]  Shonna Chock 04/22/2019, 8:46 AM

## 2019-04-22 NOTE — Progress Notes (Signed)
CSW has enlisted Chaplain assistance in potentially aiding in having volunteers retrieve his belonging from his motel prior to discharge tomorrow. CSW spoke with Chaplain Genesis who will see what she can do.   If Chaplain able to assist, information for previous hotel can be found below:  [employer] [facility] [address] in [place] in [address] Phone Number: [place], [name]

## 2019-04-23 ENCOUNTER — Encounter (HOSPITAL_COMMUNITY): Payer: Self-pay | Admitting: Internal Medicine

## 2019-04-23 DIAGNOSIS — Z23 Encounter for immunization: Secondary | ICD-10-CM | POA: Diagnosis not present

## 2019-04-23 LAB — GLUCOSE, CAPILLARY: Glucose-Capillary: 272 mg/dL — ABNORMAL HIGH (ref 70–99)
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
## 2019-04-23 NOTE — Progress Notes (Signed)
Patient discharged to motel per Neoma Laming RN Case Management.  Medications for home delivered by [facility] pharmacy, AVS given and information explained with appropriate education provided.  Walker delivered to room for home. [place] to call motel to arrange delivery of personal belongings from [place] [address] to patient.  Pt transported to [facility] by [place], [place].

## 2019-04-23 NOTE — Progress Notes (Signed)
SLP Cancellation Note  Patient Details Name: [name] MRN: [id number] DOB: April 12, 1956   Cancelled treatment:       Reason Eval/Treat Not Completed: Other (comment) evaluation attempted, patient actively being discharged from facility upon SLP entry.   Patient has known hx of swallowing problems. Recommend follow up at next level of care/next venue if new issues arise.    Marina Goodell, M.Ed., CCC-SLP Speech Therapy Acute Rehabilitation 04/23/2019, 9:50 AM

## 2019-04-23 NOTE — TOC Transition Note (Signed)
Transition of Care [facility]) - CM/SW Discharge Note   Patient Details  Name: [name] MRN: [id number] Date of Birth: [birth year]  [facility] of [facility]) CM/SW Contact:  Zenon Mayo, RN Phone Number: 04/23/2019, 10:17 AM   Clinical Narrative:    Patient is for dc today, transportation is taking him to [facility] to pick up his belongings and then he will need to call the transport phone number from there to let them know he is ready to be picked up and they will pick him up and take him to the [facility] where he is to ask for hotel for white flag day. Zack approved transport for two rides.  NCM and CSW assisted patient with clothing, rollator, shoes and transportation.     Final next level of care: Other (comment) Barriers to Discharge: No Barriers Identified   Patient Goals and CMS Choice   CMS Medicare.gov Compare Post Acute Care list provided to:: Patient Choice offered to / list presented to : Patient  Discharge Placement                       Discharge Plan and Services                                     Social Determinants of Health (SDOH) Interventions     Readmission Risk Interventions Readmission Risk Prevention Plan 08/12/2018  Transportation Screening Complete  Medication Review ([place]) Complete  PCP or Specialist appointment within 3-5 days of discharge Complete  HRI or [place] Complete  SW Recovery Care/Counseling Consult Complete  [place] Not Applicable  Some recent data might be hidden

## 2019-04-23 NOTE — Progress Notes (Signed)
Chaplain is currently waiting for a reply from the volunteer coordinator to find out if a volunteer from our near by church is available. Will notify staff as soon as Chaplain has word.   Chaplain Resident, Evelene Croon, [place] [phone] on-call

## 2019-04-23 NOTE — Plan of Care (Signed)
  Problem: Education: Goal: Knowledge of General Education information will improve Description: Including pain rating scale, medication(s)/side effects and non-pharmacologic comfort measures Outcome: Adequate for Discharge   

## 2019-04-26 NOTE — Anesthesia Postprocedure Evaluation (Signed)
Anesthesia Post Note  Patient: [name]  Procedure(s) Performed: ESOPHAGOGASTRODUODENOSCOPY (EGD) WITH PROPOFOL (N/A ) BALLOON DILATION (N/A )     Patient location during evaluation: Endoscopy Anesthesia Type: MAC Level of consciousness: awake and alert Pain management: pain level controlled Vital Signs Assessment: post-procedure vital signs reviewed and stable Respiratory status: spontaneous breathing, nonlabored ventilation, respiratory function stable and patient connected to nasal cannula oxygen Cardiovascular status: blood pressure returned to baseline and stable Postop Assessment: no apparent nausea or vomiting Anesthetic complications: no    Last Vitals:  Vitals:   04/23/19 0639 04/23/19 0918  BP: (!) 132/59 (!) 142/62  Pulse: 73 87  Resp: 18 18  Temp: (!) 36.4 C 36.7 C  SpO2: 96% 98%    Last Pain:  Vitals:   04/23/19 0918  TempSrc: Oral  PainSc: [place]

## 2019-04-30 ENCOUNTER — Other Ambulatory Visit: Payer: Self-pay | Admitting: Critical Care Medicine

## 2019-04-30 DIAGNOSIS — Z20822 Contact with and (suspected) exposure to covid-19: Secondary | ICD-10-CM

## 2019-05-01 LAB — NOVEL CORONAVIRUS, NAA: SARS-CoV-2, NAA: NOT DETECTED

## 2019-05-04 ENCOUNTER — Emergency Department (HOSPITAL_COMMUNITY)
Admission: EM | Admit: 2019-05-04 | Discharge: 2019-05-04 | Discharge status: Against Medical Advice | Payer: Medicare HMO | Source: Home / Self Care

## 2019-05-04 ENCOUNTER — Other Ambulatory Visit: Payer: Self-pay

## 2019-05-04 ENCOUNTER — Encounter (HOSPITAL_COMMUNITY): Payer: Self-pay | Admitting: Emergency Medicine

## 2019-05-04 DIAGNOSIS — Z8249 Family history of ischemic heart disease and other diseases of the circulatory system: Secondary | ICD-10-CM | POA: Diagnosis not present

## 2019-05-04 DIAGNOSIS — Z79899 Other long term (current) drug therapy: Secondary | ICD-10-CM | POA: Diagnosis not present

## 2019-05-04 DIAGNOSIS — E162 Hypoglycemia, unspecified: Secondary | ICD-10-CM | POA: Diagnosis not present

## 2019-05-04 DIAGNOSIS — B1920 Unspecified viral hepatitis C without hepatic coma: Secondary | ICD-10-CM | POA: Diagnosis not present

## 2019-05-04 DIAGNOSIS — Z8261 Family history of arthritis: Secondary | ICD-10-CM | POA: Diagnosis not present

## 2019-05-04 DIAGNOSIS — R110 Nausea: Secondary | ICD-10-CM | POA: Diagnosis not present

## 2019-05-04 DIAGNOSIS — J449 Chronic obstructive pulmonary disease, unspecified: Secondary | ICD-10-CM | POA: Diagnosis not present

## 2019-05-04 DIAGNOSIS — K2100 Gastro-esophageal reflux disease with esophagitis, without bleeding: Secondary | ICD-10-CM | POA: Diagnosis present

## 2019-05-04 DIAGNOSIS — J440 Chronic obstructive pulmonary disease with acute lower respiratory infection: Secondary | ICD-10-CM | POA: Diagnosis not present

## 2019-05-04 DIAGNOSIS — J9811 Atelectasis: Secondary | ICD-10-CM | POA: Diagnosis not present

## 2019-05-04 DIAGNOSIS — R5383 Other fatigue: Secondary | ICD-10-CM | POA: Diagnosis not present

## 2019-05-04 DIAGNOSIS — R1314 Dysphagia, pharyngoesophageal phase: Secondary | ICD-10-CM | POA: Diagnosis present

## 2019-05-04 DIAGNOSIS — Z765 Malingerer [conscious simulation]: Secondary | ICD-10-CM | POA: Diagnosis not present

## 2019-05-04 DIAGNOSIS — T383X5A Adverse effect of insulin and oral hypoglycemic [antidiabetic] drugs, initial encounter: Secondary | ICD-10-CM | POA: Diagnosis present

## 2019-05-04 DIAGNOSIS — R52 Pain, unspecified: Secondary | ICD-10-CM | POA: Diagnosis not present

## 2019-05-04 DIAGNOSIS — E1029 Type 1 diabetes mellitus with other diabetic kidney complication: Secondary | ICD-10-CM | POA: Diagnosis not present

## 2019-05-04 DIAGNOSIS — K219 Gastro-esophageal reflux disease without esophagitis: Secondary | ICD-10-CM | POA: Diagnosis not present

## 2019-05-04 DIAGNOSIS — I10 Essential (primary) hypertension: Secondary | ICD-10-CM | POA: Diagnosis not present

## 2019-05-04 DIAGNOSIS — Z03818 Encounter for observation for suspected exposure to other biological agents ruled out: Secondary | ICD-10-CM | POA: Diagnosis not present

## 2019-05-04 DIAGNOSIS — N183 Chronic kidney disease, stage 3 unspecified: Secondary | ICD-10-CM | POA: Diagnosis not present

## 2019-05-04 DIAGNOSIS — F1910 Other psychoactive substance abuse, uncomplicated: Secondary | ICD-10-CM | POA: Diagnosis not present

## 2019-05-04 DIAGNOSIS — R1110 Vomiting, unspecified: Secondary | ICD-10-CM | POA: Diagnosis not present

## 2019-05-04 DIAGNOSIS — Z833 Family history of diabetes mellitus: Secondary | ICD-10-CM | POA: Diagnosis not present

## 2019-05-04 DIAGNOSIS — R404 Transient alteration of awareness: Secondary | ICD-10-CM | POA: Diagnosis not present

## 2019-05-04 DIAGNOSIS — Z20822 Contact with and (suspected) exposure to covid-19: Secondary | ICD-10-CM | POA: Diagnosis not present

## 2019-05-04 DIAGNOSIS — Z590 Homelessness: Secondary | ICD-10-CM | POA: Diagnosis not present

## 2019-05-04 DIAGNOSIS — E1065 Type 1 diabetes mellitus with hyperglycemia: Secondary | ICD-10-CM | POA: Diagnosis not present

## 2019-05-04 DIAGNOSIS — E10649 Type 1 diabetes mellitus with hypoglycemia without coma: Secondary | ICD-10-CM | POA: Diagnosis not present

## 2019-05-04 DIAGNOSIS — R05 Cough: Secondary | ICD-10-CM | POA: Diagnosis not present

## 2019-05-04 DIAGNOSIS — E1022 Type 1 diabetes mellitus with diabetic chronic kidney disease: Secondary | ICD-10-CM | POA: Diagnosis not present

## 2019-05-04 DIAGNOSIS — R5381 Other malaise: Secondary | ICD-10-CM | POA: Diagnosis not present

## 2019-05-04 DIAGNOSIS — E1040 Type 1 diabetes mellitus with diabetic neuropathy, unspecified: Secondary | ICD-10-CM | POA: Diagnosis present

## 2019-05-04 DIAGNOSIS — E43 Unspecified severe protein-calorie malnutrition: Secondary | ICD-10-CM | POA: Diagnosis not present

## 2019-05-04 DIAGNOSIS — Z841 Family history of disorders of kidney and ureter: Secondary | ICD-10-CM | POA: Diagnosis not present

## 2019-05-04 DIAGNOSIS — R410 Disorientation, unspecified: Secondary | ICD-10-CM | POA: Diagnosis not present

## 2019-05-04 DIAGNOSIS — F1410 Cocaine abuse, uncomplicated: Secondary | ICD-10-CM | POA: Diagnosis not present

## 2019-05-04 DIAGNOSIS — Z794 Long term (current) use of insulin: Secondary | ICD-10-CM | POA: Diagnosis not present

## 2019-05-04 DIAGNOSIS — F17210 Nicotine dependence, cigarettes, uncomplicated: Secondary | ICD-10-CM | POA: Diagnosis not present

## 2019-05-04 DIAGNOSIS — E103599 Type 1 diabetes mellitus with proliferative diabetic retinopathy without macular edema, unspecified eye: Secondary | ICD-10-CM | POA: Diagnosis not present

## 2019-05-04 DIAGNOSIS — F1210 Cannabis abuse, uncomplicated: Secondary | ICD-10-CM | POA: Diagnosis not present

## 2019-05-04 DIAGNOSIS — R4020 Unspecified coma: Secondary | ICD-10-CM | POA: Diagnosis not present

## 2019-05-04 DIAGNOSIS — Z5321 Procedure and treatment not carried out due to patient leaving prior to being seen by health care provider: Secondary | ICD-10-CM | POA: Insufficient documentation

## 2019-05-04 DIAGNOSIS — J209 Acute bronchitis, unspecified: Secondary | ICD-10-CM | POA: Diagnosis not present

## 2019-05-04 DIAGNOSIS — R64 Cachexia: Secondary | ICD-10-CM | POA: Diagnosis not present

## 2019-05-04 DIAGNOSIS — I129 Hypertensive chronic kidney disease with stage 1 through stage 4 chronic kidney disease, or unspecified chronic kidney disease: Secondary | ICD-10-CM | POA: Diagnosis not present

## 2019-05-04 LAB — CBG MONITORING, ED
Glucose-Capillary: 29 mg/dL — CL (ref 70–99)
Glucose-Capillary: 34 mg/dL — CL (ref 70–99)

## 2019-05-04 NOTE — ED Triage Notes (Signed)
Patient here from home with complaints of hypoglycemia. States that he has been hungry for the past 2 days. States that the police brought him here. States that he is staying at a hotel.

## 2019-05-04 NOTE — ED Notes (Addendum)
Patient refusing care. Given sandwich, orange juice, and coke. Charge nurse notified. Dr. Laverta Baltimore notified.

## 2019-05-05 ENCOUNTER — Other Ambulatory Visit: Payer: Self-pay

## 2019-05-05 ENCOUNTER — Inpatient Hospital Stay (HOSPITAL_COMMUNITY)
Admission: EM | Admit: 2019-05-05 | Discharge: 2019-05-08 | DRG: 637 | Payer: Medicare HMO | Attending: Internal Medicine | Admitting: Internal Medicine

## 2019-05-05 ENCOUNTER — Emergency Department (HOSPITAL_COMMUNITY): Payer: Medicare HMO

## 2019-05-05 ENCOUNTER — Encounter (HOSPITAL_COMMUNITY): Payer: Self-pay

## 2019-05-05 DIAGNOSIS — R1314 Dysphagia, pharyngoesophageal phase: Secondary | ICD-10-CM | POA: Diagnosis present

## 2019-05-05 DIAGNOSIS — Z8261 Family history of arthritis: Secondary | ICD-10-CM

## 2019-05-05 DIAGNOSIS — E1065 Type 1 diabetes mellitus with hyperglycemia: Secondary | ICD-10-CM | POA: Diagnosis not present

## 2019-05-05 DIAGNOSIS — R1319 Other dysphagia: Secondary | ICD-10-CM

## 2019-05-05 DIAGNOSIS — IMO0002 Reserved for concepts with insufficient information to code with codable children: Secondary | ICD-10-CM | POA: Diagnosis present

## 2019-05-05 DIAGNOSIS — E43 Unspecified severe protein-calorie malnutrition: Secondary | ICD-10-CM | POA: Diagnosis present

## 2019-05-05 DIAGNOSIS — F1910 Other psychoactive substance abuse, uncomplicated: Secondary | ICD-10-CM | POA: Diagnosis present

## 2019-05-05 DIAGNOSIS — E10649 Type 1 diabetes mellitus with hypoglycemia without coma: Principal | ICD-10-CM

## 2019-05-05 DIAGNOSIS — E103599 Type 1 diabetes mellitus with proliferative diabetic retinopathy without macular edema, unspecified eye: Secondary | ICD-10-CM | POA: Diagnosis present

## 2019-05-05 DIAGNOSIS — Z5900 Homelessness unspecified: Secondary | ICD-10-CM

## 2019-05-05 DIAGNOSIS — F17210 Nicotine dependence, cigarettes, uncomplicated: Secondary | ICD-10-CM | POA: Diagnosis present

## 2019-05-05 DIAGNOSIS — R1310 Dysphagia, unspecified: Secondary | ICD-10-CM

## 2019-05-05 DIAGNOSIS — F1410 Cocaine abuse, uncomplicated: Secondary | ICD-10-CM | POA: Diagnosis present

## 2019-05-05 DIAGNOSIS — R64 Cachexia: Secondary | ICD-10-CM | POA: Diagnosis present

## 2019-05-05 DIAGNOSIS — Z794 Long term (current) use of insulin: Secondary | ICD-10-CM

## 2019-05-05 DIAGNOSIS — Z841 Family history of disorders of kidney and ureter: Secondary | ICD-10-CM

## 2019-05-05 DIAGNOSIS — E1040 Type 1 diabetes mellitus with diabetic neuropathy, unspecified: Secondary | ICD-10-CM | POA: Diagnosis present

## 2019-05-05 DIAGNOSIS — T383X5A Adverse effect of insulin and oral hypoglycemic [antidiabetic] drugs, initial encounter: Secondary | ICD-10-CM | POA: Diagnosis present

## 2019-05-05 DIAGNOSIS — E1140 Type 2 diabetes mellitus with diabetic neuropathy, unspecified: Secondary | ICD-10-CM | POA: Diagnosis present

## 2019-05-05 DIAGNOSIS — E162 Hypoglycemia, unspecified: Secondary | ICD-10-CM

## 2019-05-05 DIAGNOSIS — I10 Essential (primary) hypertension: Secondary | ICD-10-CM | POA: Diagnosis present

## 2019-05-05 DIAGNOSIS — R1110 Vomiting, unspecified: Secondary | ICD-10-CM

## 2019-05-05 DIAGNOSIS — J209 Acute bronchitis, unspecified: Secondary | ICD-10-CM | POA: Diagnosis present

## 2019-05-05 DIAGNOSIS — E1010 Type 1 diabetes mellitus with ketoacidosis without coma: Secondary | ICD-10-CM

## 2019-05-05 DIAGNOSIS — Z8249 Family history of ischemic heart disease and other diseases of the circulatory system: Secondary | ICD-10-CM

## 2019-05-05 DIAGNOSIS — B1920 Unspecified viral hepatitis C without hepatic coma: Secondary | ICD-10-CM | POA: Diagnosis present

## 2019-05-05 DIAGNOSIS — Z765 Malingerer [conscious simulation]: Secondary | ICD-10-CM

## 2019-05-05 DIAGNOSIS — Z6820 Body mass index (BMI) 20.0-20.9, adult: Secondary | ICD-10-CM

## 2019-05-05 DIAGNOSIS — E1029 Type 1 diabetes mellitus with other diabetic kidney complication: Secondary | ICD-10-CM | POA: Diagnosis present

## 2019-05-05 DIAGNOSIS — Z20822 Contact with and (suspected) exposure to covid-19: Secondary | ICD-10-CM | POA: Diagnosis present

## 2019-05-05 DIAGNOSIS — J449 Chronic obstructive pulmonary disease, unspecified: Secondary | ICD-10-CM | POA: Diagnosis present

## 2019-05-05 DIAGNOSIS — F1110 Opioid abuse, uncomplicated: Secondary | ICD-10-CM | POA: Diagnosis present

## 2019-05-05 DIAGNOSIS — K2100 Gastro-esophageal reflux disease with esophagitis, without bleeding: Secondary | ICD-10-CM | POA: Diagnosis present

## 2019-05-05 DIAGNOSIS — K209 Esophagitis, unspecified without bleeding: Secondary | ICD-10-CM | POA: Diagnosis present

## 2019-05-05 DIAGNOSIS — K220 Achalasia of cardia: Secondary | ICD-10-CM

## 2019-05-05 DIAGNOSIS — Z833 Family history of diabetes mellitus: Secondary | ICD-10-CM

## 2019-05-05 DIAGNOSIS — J440 Chronic obstructive pulmonary disease with acute lower respiratory infection: Secondary | ICD-10-CM | POA: Diagnosis present

## 2019-05-05 DIAGNOSIS — F1210 Cannabis abuse, uncomplicated: Secondary | ICD-10-CM | POA: Diagnosis present

## 2019-05-05 DIAGNOSIS — R112 Nausea with vomiting, unspecified: Secondary | ICD-10-CM | POA: Clinically undetermined

## 2019-05-05 DIAGNOSIS — Z885 Allergy status to narcotic agent status: Secondary | ICD-10-CM

## 2019-05-05 LAB — COMPREHENSIVE METABOLIC PANEL
ALT: 23 U/L (ref 0–44)
AST: 24 U/L (ref 15–41)
Albumin: 3.4 g/dL — ABNORMAL LOW (ref 3.5–5.0)
Alkaline Phosphatase: 94 U/L (ref 38–126)
Anion gap: 10 (ref 5–15)
BUN: 16 mg/dL (ref 8–23)
CO2: 26 mmol/L (ref 22–32)
Calcium: 9.6 mg/dL (ref 8.9–10.3)
Chloride: 103 mmol/L (ref 98–111)
Creatinine, Ser: 1.23 mg/dL (ref 0.61–1.24)
GFR calc Af Amer: >60 mL/min (ref >60)
GFR calc non Af Amer: >60 mL/min (ref >60)
Glucose, Bld: 143 mg/dL — ABNORMAL HIGH (ref 70–99)
Potassium: 3.7 mmol/L (ref 3.5–5.1)
Sodium: 139 mmol/L (ref 135–145)
Total Bilirubin: 0.3 mg/dL (ref 0.3–1.2)
Total Protein: 7.3 g/dL (ref 6.5–8.1)

## 2019-05-05 LAB — CBC WITH DIFFERENTIAL/PLATELET
Abs Immature Granulocytes: 0.1 10*3/uL — ABNORMAL HIGH (ref 0.00–0.07)
Basophils Absolute: 0.1 10*3/uL (ref 0.0–0.1)
Basophils Relative: 0 %
Eosinophils Absolute: 0 10*3/uL (ref 0.0–0.5)
Eosinophils Relative: 0 %
HCT: 35.7 % — ABNORMAL LOW (ref 39.0–52.0)
Hemoglobin: 9.9 g/dL — ABNORMAL LOW (ref 13.0–17.0)
Immature Granulocytes: 1 %
Lymphocytes Relative: 4 %
Lymphs Abs: 0.7 10*3/uL (ref 0.7–4.0)
MCH: 27.5 pg (ref 26.0–34.0)
MCHC: 27.7 g/dL — ABNORMAL LOW (ref 30.0–36.0)
MCV: 99.2 fL (ref 80.0–100.0)
Monocytes Absolute: 0.8 10*3/uL (ref 0.1–1.0)
Monocytes Relative: 5 %
Neutro Abs: 16 10*3/uL — ABNORMAL HIGH (ref 1.7–7.7)
Neutrophils Relative %: 90 %
Platelets: 313 10*3/uL (ref 150–400)
RBC: 3.6 MIL/uL — ABNORMAL LOW (ref 4.22–5.81)
RDW: 17.6 % — ABNORMAL HIGH (ref 11.5–15.5)
WBC: 17.7 10*3/uL — ABNORMAL HIGH (ref 4.0–10.5)
nRBC: 0 % (ref 0.0–0.2)

## 2019-05-05 LAB — CBG MONITORING, ED
Glucose-Capillary: 144 mg/dL — ABNORMAL HIGH (ref 70–99)
Glucose-Capillary: 89 mg/dL (ref 70–99)
Glucose-Capillary: 68 mg/dL — ABNORMAL LOW (ref 70–99)

## 2019-05-05 LAB — ETHANOL: Alcohol, Ethyl (B): <10 mg/dL (ref <10)

## 2019-05-05 LAB — AMMONIA: Ammonia: 26 umol/L (ref 9–35)

## 2019-05-05 MED ORDER — SODIUM CHLORIDE 0.9% FLUSH
3.0000 mL | Freq: Two times a day (BID) | INTRAVENOUS | Status: DC
  Administered 2019-05-05: 3 mL via INTRAVENOUS

## 2019-05-05 MED ORDER — DEXTROSE 50 % IV SOLN
1.0000 | Freq: Once | INTRAVENOUS | Status: AC
  Administered 2019-05-05: 50 mL via INTRAVENOUS
  Filled 2019-05-05: qty 50

## 2019-05-05 MED ORDER — DEXTROSE 5 % IV SOLN: Freq: Once | INTRAVENOUS | Status: AC

## 2019-05-05 MED ORDER — SODIUM CHLORIDE 0.9% FLUSH: 3.0000 mL | INTRAVENOUS | Status: DC | PRN

## 2019-05-05 MED ORDER — SODIUM CHLORIDE 0.9 % IV SOLN: 250.0000 mL | INTRAVENOUS | Status: DC | PRN

## 2019-05-05 MED ORDER — LORAZEPAM 2 MG/ML IJ SOLN
1.0000 mg | Freq: Once | INTRAMUSCULAR | Status: AC
  Administered 2019-05-05: 1 mg via INTRAMUSCULAR

## 2019-05-05 MED ORDER — LORAZEPAM 2 MG/ML IJ SOLN
1.0000 mg | Freq: Once | INTRAMUSCULAR | Status: DC
  Filled 2019-05-05: qty 1

## 2019-05-05 NOTE — ED Notes (Signed)
ED Provider at bedside. 

## 2019-05-05 NOTE — ED Notes (Signed)
Pt refused to go to CT for scan, pt refusing to VS rechecked. Nuala Alpha, [place] aware.

## 2019-05-05 NOTE — ED Provider Notes (Addendum)
[facility] DEPT Provider Note   CSN: [id number] Arrival date & time: 05/05/19  2000     History Chief Complaint  Patient presents with  . Hypoglycemia    [name] is a 64 y.o. male history of type 1 diabetes, polysubstance abuse, hepatitis, CKD.  Patient arrives via EMS from a hotel.  EMS was called out for altered mental status, on arrival CBG of 58, was given IV dextrose with improvement of CBG to 160 and some improvement in mental status.  EMS reports that patient is currently staying at the motel with his son who does not know how to dose his insulin.  Reportedly patient does not check his blood sugar levels before giving himself insulin which leads to episodes of hypoglycemia. - On my initial evaluation patient curled up on his right side sleeping, he is arousable to voice, he would not answer questions repeatedly saying that he is cold and requesting blankets.  He would not answer his name, where he is, what day it is or why he is here.  He would not answer if he is experiencing any pain.  He moves bilateral extremities with equal strength.  Level 5 caveat altered mental status HPI     Past Medical History:  Diagnosis Date  . DEPRESSION   . DIABETES MELLITUS, TYPE I   . DRUG ABUSE    pt should have NO controlled substances rx'ed  . Glaucoma   . HEPATITIS C    chronic  . Heroin overdose ([place]) 10/03/2018  . Heroin use 10/03/2018  . Hypertension 02/18/2011  . 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 Vitiligo     Patient Active Problem List   Diagnosis Date Noted  . Esophageal stricture   . Erosive esophagitis   . CKD (chronic kidney disease), stage III 03/31/2019  . Normocytic anemia 03/31/2019  . Otitis media 03/31/2019  . Homelessness 03/31/2019  . Adjustment disorder with mixed disturbance of emotions and conduct   . Sepsis due to pneumonia ([place]) 11/11/2018  . Hyperkalemia 11/11/2018  . Esophageal dysphagia 10/13/2018    . Acute esophagitis 10/13/2018  . Loose stools 10/13/2018  . Routine general medical examination at a health care facility 10/13/2018  . Left lower lobe pneumonia 10/07/2018  . Acute respiratory failure ([place])   . AMS (altered mental status)   . Gastroesophageal reflux disease   . Hallucinations   . Hypoglycemia associated with diabetes ([place]) 08/12/2018  . At risk for adverse drug event 02/17/2018  . Abrasions of multiple sites   . Demand ischemia of myocardium ([place]) 02/05/2018  . Acute metabolic encephalopathy 63/84/6659  . DKA (diabetic ketoacidoses) ([place]) 02/03/2018  . Leukocytosis 02/03/2018  . Hypoglycemia 12/24/2017  . Syncope 12/24/2017  . Syncope and collapse 12/23/2017  . IV drug abuse ([place]) 04/19/2017  . Acute encephalopathy 04/19/2017  . Hemangioma 10/02/2016  . Foreign body (FB) in soft tissue   . 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[place])   . Gangrene ([place])   . Type I (juvenile type) diabetes mellitus without mention of complication, not stated as uncontrolled   . Tobacco abuse   . Essential hypertension   . Diabetic infection of right foot ([place]) 07/22/2016  . Diabetic foot infection ([place]) 07/22/2016  . Chronic ulcer of heel, right, with fat layer exposed ([place]) 05/20/2016  . DKA, type 1 ([place]) 04/18/2016  . AKI (acute kidney injury) ([place]) 04/18/2016  . Hyponatremia 04/18/2016  . Severe protein-calorie malnutrition ([place]) 04/18/2016  . Elevated  troponin 04/18/2016  . Poor dentition 07/12/2015  . Tobacco use 07/06/2013  . PAD (peripheral artery disease) ([place]) 04/29/2013  . COPD, mild ([place])   . 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 ([place]) 11/18/2011  . Diabetic neuropathy ([place]) 06/21/2010  . PROLIFERATIVE DIABETIC RETINOPATHY 06/21/2010  . SKIN TAG 01/30/2010  . Chronic hepatitis C ([place]) 11/22/2008  . VITILIGO 11/22/2008  . PROTEINURIA, MILD 11/22/2008  . Substance abuse ([place]) 04/23/2007  . DEPRESSION 11/11/2006    Past Surgical  History:  Procedure Laterality Date  . ABDOMINAL AORTOGRAM W/LOWER EXTREMITY N/A 07/25/2016   Procedure: Abdominal Aortogram w/Bilateral Lower Extremity Runoff;  Surgeon: Conrad Brown Deer, MD;  Location: [facility];  Service: Cardiovascular;  Laterality: N/A;  . ABDOMINAL AORTOGRAM W/LOWER EXTREMITY N/A 12/24/2016   Procedure: ABDOMINAL AORTOGRAM W/LOWER EXTREMITY;  Surgeon: Serafina Mitchell, MD;  Location: [facility];  Service: Cardiovascular;  Laterality: N/A;  . AMPUTATION TOE Right 07/26/2016   Procedure: AMPUTATION GREAT TOE;  Surgeon: Serafina Mitchell, MD;  Location: [facility];  Service: Vascular;  Laterality: Right;  . BALLOON DILATION N/A 04/22/2019   Procedure: BALLOON DILATION;  Surgeon: Irene Shipper, MD;  Location: [facility] ENDOSCOPY;  Service: Endoscopy;  Laterality: N/A;  . BIOPSY  08/25/2018   Procedure: BIOPSY;  Surgeon: Irving Copas., MD;  Location: [facility];  Service: Gastroenterology;;  . BIOPSY  11/16/2018   Procedure: BIOPSY;  Surgeon: Jerene Bears, MD;  Location: [facility] ENDOSCOPY;  Service: Gastroenterology;;  . ESOPHAGOGASTRODUODENOSCOPY N/A 08/25/2018   Procedure: ESOPHAGOGASTRODUODENOSCOPY (EGD);  Surgeon: Irving Copas., MD;  Location: [facility];  Service: Gastroenterology;  Laterality: N/A;  . ESOPHAGOGASTRODUODENOSCOPY (EGD) WITH PROPOFOL N/A 11/16/2018   Procedure: ESOPHAGOGASTRODUODENOSCOPY (EGD) WITH PROPOFOL;  Surgeon: Jerene Bears, MD;  Location: [facility] ENDOSCOPY;  Service: Gastroenterology;  Laterality: N/A;  . ESOPHAGOGASTRODUODENOSCOPY (EGD) WITH PROPOFOL N/A 04/22/2019   Procedure: ESOPHAGOGASTRODUODENOSCOPY (EGD) WITH PROPOFOL;  Surgeon: Irene Shipper, MD;  Location: [facility] ENDOSCOPY;  Service: Endoscopy;  Laterality: N/A;  . EYE SURGERY     retinal surgery x 2, right eye  . INSERTION OF ILIAC STENT Right 07/26/2016   Procedure: INSERTION OF RIGHT POPLITEAL STENT WITH BALLOON ANGIOPLASTY;  Surgeon: Serafina Mitchell, MD;  Location: [facility];  Service:  Vascular;  Laterality: Right;  . LOWER EXTREMITY ANGIOGRAM Right 07/26/2016   Procedure: LOWER EXTREMITY ANGIOGRAM;  Surgeon: Serafina Mitchell, MD;  Location: [facility];  Service: Vascular;  Laterality: Right;  . PERIPHERAL VASCULAR BALLOON ANGIOPLASTY Right 07/26/2016   Procedure: PERIPHERAL VASCULAR BALLOON ANGIOPLASTY  RIGHT ANTERIOR TIBEAL ARTERY AND RIGHT SUPERFICIAL FEMORAL ARTERY;  Surgeon: Serafina Mitchell, MD;  Location: [facility];  Service: Vascular;  Laterality: Right;  . PERIPHERAL VASCULAR INTERVENTION Right 12/24/2016   Procedure: PERIPHERAL VASCULAR INTERVENTION;  Surgeon: Serafina Mitchell, MD;  Location: [facility];  Service: Cardiovascular;  Laterality: Right;  lower extr       Family History  Problem Relation Age of Onset  . Diabetes Father   . Kidney disease Father   . Arthritis Mother   . Heart disease Mother        CAD  . Colon cancer Neg Hx   . Esophageal cancer Neg Hx   . Inflammatory bowel disease Neg Hx   . Liver disease Neg Hx   . Pancreatic cancer Neg Hx   . Rectal cancer Neg Hx   . Stomach cancer Neg Hx     Social History  Tobacco Use  . Smoking status: Current Every Day Smoker    Packs/day: 1.00    Years: 51.00    Pack years: 51.00    Types: Cigarettes  . Smokeless tobacco: Former Systems developer    Types: Chew    Quit date: 1985  Substance Use Topics  . Alcohol use: No    Alcohol/week: 0.0 standard drinks    Comment: 08/14/14 none  . Drug use: Yes    Types: Marijuana, Heroin, Cocaine, "Crack" cocaine, Fentanyl, Methylphenidate    Comment: s/p rehab x 6, occasional drug use (per pt last  use was 06/2015)    Home Medications Prior to Admission medications   Medication Sig Start Date End Date Taking? Authorizing Provider  Accu-Chek Softclix Lancets lancets Use to monitor glucose levels BID; E10.29 04/22/19   Swayze, Ava, DO  acetaminophen (TYLENOL) 325 MG tablet Take 2 tablets (650 mg total) by mouth every 6 (six) hours as needed for mild pain or headache.  04/07/19   Mercy Riding, MD  amLODipine (NORVASC) 10 MG tablet Take 1 tablet (10 mg total) by mouth daily. 04/22/19   Swayze, Ava, DO  atorvastatin (LIPITOR) 40 MG tablet Take 1 tablet (40 mg total) by mouth daily at 6 PM. 04/22/19   Swayze, Ava, DO  Blood Glucose Calibration (ACCU-CHEK AVIVA) SOLN 1 each by In Vitro route as needed. Use to calibrate meter prn 08/19/18   Renato Shin, MD  blood glucose meter kit and supplies KIT Dispense based on patient and insurance preference. Use up to four times daily as directed. (FOR ICD-9 250.00, 250.01). 08/15/18   Cristal Ford, DO  Blood Glucose Monitoring Suppl (ACCU-CHEK AVIVA PLUS) w/Device KIT 1 each by Does not apply route 2 (two) times a day. Use to monitor glucose levels BID; E10.29 08/19/18   Renato Shin, MD  ferrous sulfate 325 (65 FE) MG tablet Take 1 tablet (325 mg total) by mouth daily with breakfast. 04/22/19   Swayze, Ava, DO  gabapentin (NEURONTIN) 300 MG capsule Take 1 capsule (300 mg total) by mouth 2 (two) times daily at 10 am and 4 pm AND 3 capsules (900 mg total) at bedtime. 04/22/19   Swayze, Ava, DO  glucose blood (ACCU-CHEK AVIVA PLUS) test strip Use to monitor glucose levels BID; E10.29 04/22/19   Swayze, Ava, DO  insulin aspart (NOVOLOG) 100 UNIT/ML injection Inject 4 Units into the skin 3 (three) times daily with meals. If he eats greater than 50% of his meals 04/22/19   Swayze, Ava, DO  insulin aspart (NOVOLOG) 100 UNIT/ML injection Glucose < 70: implement hypoglycemia protocol Glucose 70 - 120: 0 units Glucose 121 - 150: 2 units Glucose 151 - 200: 3 units Glucose 201 - 250: 5 units Glucose 251 - 300: 8 units Glucose 301 - 350: 11 units Glucose 351 - 400: 15 units and call your doctor or go to emergency department  5.  Correction coverage at bedtime (9pm) Glucose < 70: implement hypoglycemia protocol Glucose  70 - 120: 0 units Glucose 121 - 150: 0 units Glucose 151 - 200: 0 units Glucose 201 - 250: 2 units Glucose 251 - 300: 3  units Glucose 301 - 350: 4 units and call you doctor or go to emergency department 04/22/19   Swayze, Ava, DO  insulin glargine (LANTUS) 100 UNIT/ML injection Inject 0.25 mLs (25 Units total) into the skin daily. 04/22/19   Swayze, Ava, DO  insulin glargine (LANTUS) 100 UNIT/ML injection Inject 0.07 mLs (7 Units total) into  the skin 2 (two) times daily. 04/22/19   Swayze, Ava, DO  Insulin Pen Needle 31G X 5 MM MISC Use with insulin pen as directed 04/22/19   Swayze, Ava, DO  lisinopril (ZESTRIL) 2.5 MG tablet Take 1 tablet (2.5 mg total) by mouth daily. 04/23/19   Swayze, Ava, DO  nicotine (NICODERM CQ - DOSED IN MG/24 HOURS) 21 mg/24hr patch Place 1 patch (21 mg total) onto the skin daily. 04/08/19   Mercy Riding, MD  oxyCODONE-acetaminophen (PERCOCET/ROXICET) 5-325 MG tablet Take 1 tablet by mouth every 6 (six) hours as needed for severe pain. 04/22/19   Swayze, Ava, DO  pantoprazole (PROTONIX) 40 MG tablet Take 1 tablet (40 mg total) by mouth 2 (two) times daily. 04/22/19   Swayze, Ava, DO  senna-docusate (SENOKOT-S) 8.6-50 MG tablet Take 1 tablet by mouth 2 (two) times daily as needed for moderate constipation. 04/07/19   Mercy Riding, MD  senna-docusate (SENOKOT-S) 8.6-50 MG tablet Take 2 tablets by mouth 2 (two) times daily. 04/22/19   Swayze, Ava, DO    Allergies    Codeine  Review of Systems   Review of Systems  Unable to perform ROS: Mental status change    Physical Exam Updated Vital Signs BP (!) 153/74 (BP Location: Left Arm)   Pulse 98   Resp 18   SpO2 98%   Physical Exam Constitutional:      General: He is not in acute distress.    Appearance: He is well-developed. He is not ill-appearing or diaphoretic.     Comments: Disheveled appearing  HENT:     Head: Normocephalic and atraumatic.     Right Ear: External ear normal.     Left Ear: External ear normal.     Nose: Nose normal.  Eyes:     General: Vision grossly intact. Gaze aligned appropriately.     Pupils: Pupils are equal,  round, and reactive to light.  Neck:     Trachea: Trachea and phonation normal. No tracheal deviation.  Pulmonary:     Effort: Pulmonary effort is normal. No respiratory distress.  Abdominal:     General: There is no distension.     Palpations: Abdomen is soft.     Tenderness: There is no abdominal tenderness. There is no guarding or rebound.  Musculoskeletal:        General: Normal range of motion.     Cervical back: Normal range of motion.  Skin:    General: Skin is warm and dry.  Neurological:     Mental Status: He is lethargic.     GCS: GCS eye subscore is 4. GCS verbal subscore is 5. GCS motor subscore is 6.     Comments: Speech is clear, repetitive, does not follow commands.  Major Cranial nerves without deficit, no facial droop Moves extremities without ataxia, coordination intact  Psychiatric:        Behavior: Behavior is uncooperative.     ED Results / Procedures / Treatments   Labs (all labs ordered are listed, but only abnormal results are displayed) Labs Reviewed  CBG MONITORING, ED - Abnormal; Notable for the following components:      Result Value   Glucose-Capillary 144 (*)    All other components within normal limits  CBC WITH DIFFERENTIAL/PLATELET  COMPREHENSIVE METABOLIC PANEL  URINALYSIS, ROUTINE W REFLEX MICROSCOPIC  AMMONIA    EKG None  Radiology No results found.  Procedures Procedures (including critical care time)  Medications Ordered in ED Medications -  No data to display  ED Course  I have reviewed the triage vital signs and the nursing notes.  Pertinent labs & imaging results that were available during my care of the patient were reviewed by me and considered in my medical decision making (see chart for details).    MDM Rules/Calculators/A&P                     64 year old male arrives via EMS, called out for altered mental status, noted to be hypoglycemic on EMS arrival at 58 improved with dextrose.  He is uncooperative with  initial evaluation repetitive in his requests and would not answer any questions or follow any commands.  There is no sign of significant traumatic injury, he is disheveled appearing.  Will begin work-up for altered mental status at this time, additionally patient has prescription for both NovoLog and Lantus, unsure which patient may have taken today well check serial CBGs. - CBC shows new leukocytosis of 17.7, hemoglobin 9.9 which is near baseline CMP nonacute, glucose 143, albumin 3.4 otherwise within normal limits Ammonia 26 Ethanol 10 - Patient's CBG has again dropped to 68, ampule and D5 infusion has been ordered. He has been agitated throughout the visit and refusing treatments however he remains disoriented and unable to answer any questions or follow any commands. Case discussed with Dr. Alvino Chapel, will give patient Ativan for his agitation and obtain imaging prior to calling hospitalist service for admission. - 11:35 PM: Patient reassessed, sleeping comfortably, easily arousable to voice.  Sits up in bed and looks at me, he requests that I turn off the light.  He would not answer any questions or follow any commands. - Care handoff given to Lorre Munroe, PA-C at shift change.  Plan of care is to follow-up on pending imaging, anticipate admission.  Disposition per oncoming team. - 11:45 PM: Brought to room as patient has pulled out his IV and is being uncooperative with nursing staff.  Ativan has been changed to intramuscular dose.  I had a discussion with the patient, he does appear more alert and oriented.  He is requesting to sleep.  He reports he will be cooperative with his next IV and with imaging here in the emergency department, he is now calm.   Note: Portions of this report may have been transcribed using voice recognition software. Every effort was made to ensure accuracy; however, inadvertent computerized transcription errors may still be present. Final Clinical Impression(s) /  ED Diagnoses Final diagnoses:  None    Rx / DC Orders ED Discharge Orders    None       Deliah Boston, PA-C 05/05/19 2342    Gari Crown 05/05/19 2348    Davonna Belling, MD 05/08/19 2028

## 2019-05-05 NOTE — ED Notes (Signed)
Unable to obtain a temperature at this time. Pt refuses. Pt also refuses to let this writer put him on the heart monitor. Patient continues to yell and say "No just cover me up!! Cover me up! Don't touch me, just cover me up!"

## 2019-05-05 NOTE — ED Notes (Addendum)
Patient refusing cardiac monitoring, additional CBG checks, and chest xray. Nuala Alpha, [place] aware.

## 2019-05-05 NOTE — ED Notes (Signed)
[name], [place] aware patients CBG 89

## 2019-05-05 NOTE — ED Notes (Signed)
Pt refusing VS recheck

## 2019-05-05 NOTE — ED Notes (Signed)
CBG 144 

## 2019-05-05 NOTE — ED Triage Notes (Signed)
Pt BIB EMS from hotel. Pt seen yesterday here in [place] for same complaint. CBG 58 Pt would not answer questions for EMS, pt just repeatedly telling EMS he is cold.  D10 given IV with EMS at [place] CBG 160 at 1959 Pt refused VS with EMS  20G L FA

## 2019-05-05 NOTE — ED Notes (Signed)
Pt refusing CT scan x 2. RN aware.

## 2019-05-06 ENCOUNTER — Inpatient Hospital Stay (HOSPITAL_COMMUNITY): Payer: Medicare HMO

## 2019-05-06 ENCOUNTER — Emergency Department (HOSPITAL_COMMUNITY): Payer: Medicare HMO

## 2019-05-06 ENCOUNTER — Other Ambulatory Visit (HOSPITAL_COMMUNITY): Payer: Medicare HMO

## 2019-05-06 DIAGNOSIS — Z833 Family history of diabetes mellitus: Secondary | ICD-10-CM | POA: Diagnosis not present

## 2019-05-06 DIAGNOSIS — T383X5A Adverse effect of insulin and oral hypoglycemic [antidiabetic] drugs, initial encounter: Secondary | ICD-10-CM | POA: Diagnosis present

## 2019-05-06 DIAGNOSIS — J449 Chronic obstructive pulmonary disease, unspecified: Secondary | ICD-10-CM | POA: Diagnosis not present

## 2019-05-06 DIAGNOSIS — Z794 Long term (current) use of insulin: Secondary | ICD-10-CM | POA: Diagnosis not present

## 2019-05-06 DIAGNOSIS — N183 Chronic kidney disease, stage 3 unspecified: Secondary | ICD-10-CM | POA: Diagnosis not present

## 2019-05-06 DIAGNOSIS — K2100 Gastro-esophageal reflux disease with esophagitis, without bleeding: Secondary | ICD-10-CM | POA: Diagnosis present

## 2019-05-06 DIAGNOSIS — Z590 Homelessness: Secondary | ICD-10-CM | POA: Diagnosis not present

## 2019-05-06 DIAGNOSIS — E43 Unspecified severe protein-calorie malnutrition: Secondary | ICD-10-CM | POA: Diagnosis present

## 2019-05-06 DIAGNOSIS — Z841 Family history of disorders of kidney and ureter: Secondary | ICD-10-CM | POA: Diagnosis not present

## 2019-05-06 DIAGNOSIS — E10649 Type 1 diabetes mellitus with hypoglycemia without coma: Secondary | ICD-10-CM | POA: Diagnosis present

## 2019-05-06 DIAGNOSIS — E1065 Type 1 diabetes mellitus with hyperglycemia: Secondary | ICD-10-CM | POA: Diagnosis not present

## 2019-05-06 DIAGNOSIS — Z765 Malingerer [conscious simulation]: Secondary | ICD-10-CM | POA: Diagnosis not present

## 2019-05-06 DIAGNOSIS — E162 Hypoglycemia, unspecified: Secondary | ICD-10-CM | POA: Diagnosis present

## 2019-05-06 DIAGNOSIS — F1210 Cannabis abuse, uncomplicated: Secondary | ICD-10-CM | POA: Diagnosis present

## 2019-05-06 DIAGNOSIS — B1920 Unspecified viral hepatitis C without hepatic coma: Secondary | ICD-10-CM | POA: Diagnosis present

## 2019-05-06 DIAGNOSIS — J209 Acute bronchitis, unspecified: Secondary | ICD-10-CM | POA: Diagnosis present

## 2019-05-06 DIAGNOSIS — R5383 Other fatigue: Secondary | ICD-10-CM | POA: Diagnosis present

## 2019-05-06 DIAGNOSIS — J440 Chronic obstructive pulmonary disease with acute lower respiratory infection: Secondary | ICD-10-CM | POA: Diagnosis present

## 2019-05-06 DIAGNOSIS — I129 Hypertensive chronic kidney disease with stage 1 through stage 4 chronic kidney disease, or unspecified chronic kidney disease: Secondary | ICD-10-CM | POA: Diagnosis not present

## 2019-05-06 DIAGNOSIS — Z79899 Other long term (current) drug therapy: Secondary | ICD-10-CM | POA: Diagnosis not present

## 2019-05-06 DIAGNOSIS — Z20822 Contact with and (suspected) exposure to covid-19: Secondary | ICD-10-CM | POA: Diagnosis present

## 2019-05-06 DIAGNOSIS — R1314 Dysphagia, pharyngoesophageal phase: Secondary | ICD-10-CM | POA: Diagnosis present

## 2019-05-06 DIAGNOSIS — E103599 Type 1 diabetes mellitus with proliferative diabetic retinopathy without macular edema, unspecified eye: Secondary | ICD-10-CM | POA: Diagnosis present

## 2019-05-06 DIAGNOSIS — I10 Essential (primary) hypertension: Secondary | ICD-10-CM | POA: Diagnosis present

## 2019-05-06 DIAGNOSIS — E1029 Type 1 diabetes mellitus with other diabetic kidney complication: Secondary | ICD-10-CM | POA: Diagnosis present

## 2019-05-06 DIAGNOSIS — Z8261 Family history of arthritis: Secondary | ICD-10-CM | POA: Diagnosis not present

## 2019-05-06 DIAGNOSIS — E1022 Type 1 diabetes mellitus with diabetic chronic kidney disease: Secondary | ICD-10-CM | POA: Diagnosis not present

## 2019-05-06 DIAGNOSIS — F17210 Nicotine dependence, cigarettes, uncomplicated: Secondary | ICD-10-CM | POA: Diagnosis present

## 2019-05-06 DIAGNOSIS — Z8249 Family history of ischemic heart disease and other diseases of the circulatory system: Secondary | ICD-10-CM | POA: Diagnosis not present

## 2019-05-06 DIAGNOSIS — R64 Cachexia: Secondary | ICD-10-CM | POA: Diagnosis present

## 2019-05-06 DIAGNOSIS — F1910 Other psychoactive substance abuse, uncomplicated: Secondary | ICD-10-CM

## 2019-05-06 DIAGNOSIS — E1040 Type 1 diabetes mellitus with diabetic neuropathy, unspecified: Secondary | ICD-10-CM | POA: Diagnosis present

## 2019-05-06 DIAGNOSIS — R112 Nausea with vomiting, unspecified: Secondary | ICD-10-CM | POA: Clinically undetermined

## 2019-05-06 DIAGNOSIS — F1410 Cocaine abuse, uncomplicated: Secondary | ICD-10-CM | POA: Diagnosis not present

## 2019-05-06 LAB — GLUCOSE, CAPILLARY
Glucose-Capillary: 165 mg/dL — ABNORMAL HIGH (ref 70–99)
Glucose-Capillary: 164 mg/dL — ABNORMAL HIGH (ref 70–99)
Glucose-Capillary: 183 mg/dL — ABNORMAL HIGH (ref 70–99)
Glucose-Capillary: 177 mg/dL — ABNORMAL HIGH (ref 70–99)
Glucose-Capillary: 205 mg/dL — ABNORMAL HIGH (ref 70–99)
Glucose-Capillary: 424 mg/dL — ABNORMAL HIGH (ref 70–99)
Glucose-Capillary: 426 mg/dL — ABNORMAL HIGH (ref 70–99)
Glucose-Capillary: 201 mg/dL — ABNORMAL HIGH (ref 70–99)

## 2019-05-06 LAB — BASIC METABOLIC PANEL
Anion gap: 10 (ref 5–15)
BUN: 16 mg/dL (ref 8–23)
CO2: 23 mmol/L (ref 22–32)
Calcium: 8.8 mg/dL — ABNORMAL LOW (ref 8.9–10.3)
Chloride: 102 mmol/L (ref 98–111)
Creatinine, Ser: 1.19 mg/dL (ref 0.61–1.24)
GFR calc Af Amer: >60 mL/min (ref >60)
GFR calc non Af Amer: >60 mL/min (ref >60)
Glucose, Bld: 174 mg/dL — ABNORMAL HIGH (ref 70–99)
Potassium: 3.8 mmol/L (ref 3.5–5.1)
Sodium: 135 mmol/L (ref 135–145)

## 2019-05-06 LAB — CBG MONITORING, ED
Glucose-Capillary: 138 mg/dL — ABNORMAL HIGH (ref 70–99)
Glucose-Capillary: 159 mg/dL — ABNORMAL HIGH (ref 70–99)
Glucose-Capillary: 200 mg/dL — ABNORMAL HIGH (ref 70–99)

## 2019-05-06 LAB — RAPID URINE DRUG SCREEN, HOSP PERFORMED
Amphetamines: NOT DETECTED
Barbiturates: NOT DETECTED
Benzodiazepines: NOT DETECTED
Cocaine: POSITIVE — AB
Opiates: POSITIVE — AB
Tetrahydrocannabinol: POSITIVE — AB

## 2019-05-06 LAB — RESPIRATORY PANEL BY RT PCR (FLU A&B, COVID)
Influenza A by PCR: NEGATIVE
Influenza B by PCR: NEGATIVE
SARS Coronavirus 2 by RT PCR: NEGATIVE

## 2019-05-06 MED ORDER — GABAPENTIN 300 MG PO CAPS
300.0000 mg | ORAL_CAPSULE | Freq: Three times a day (TID) | ORAL | Status: DC
  Administered 2019-05-06: 300 mg via ORAL
  Filled 2019-05-06: qty 1

## 2019-05-06 MED ORDER — GABAPENTIN 300 MG PO CAPS: 300.0000 mg | ORAL_CAPSULE | ORAL | Status: DC

## 2019-05-06 MED ORDER — GABAPENTIN 300 MG PO CAPS: 300.0000 mg | ORAL_CAPSULE | Freq: Three times a day (TID) | ORAL | Status: DC

## 2019-05-06 MED ORDER — NICOTINE 21 MG/24HR TD PT24
21.0000 mg | MEDICATED_PATCH | Freq: Every day | TRANSDERMAL | Status: DC
  Administered 2019-05-06 – 2019-05-08 (×3): 21 mg via TRANSDERMAL
  Filled 2019-05-06 (×3): qty 1

## 2019-05-06 MED ORDER — PROCHLORPERAZINE EDISYLATE 10 MG/2ML IJ SOLN
10.0000 mg | Freq: Four times a day (QID) | INTRAMUSCULAR | Status: DC | PRN
  Administered 2019-05-06: 10 mg via INTRAVENOUS
  Filled 2019-05-06: qty 2

## 2019-05-06 MED ORDER — KETOROLAC TROMETHAMINE 30 MG/ML IJ SOLN
30.0000 mg | Freq: Three times a day (TID) | INTRAMUSCULAR | Status: DC
  Administered 2019-05-06 – 2019-05-08 (×6): 30 mg via INTRAVENOUS
  Filled 2019-05-06 (×6): qty 1

## 2019-05-06 MED ORDER — ONDANSETRON HCL 4 MG/2ML IJ SOLN: 4.0000 mg | Freq: Four times a day (QID) | INTRAMUSCULAR | Status: DC | PRN

## 2019-05-06 MED ORDER — ACETAMINOPHEN 325 MG PO TABS
650.0000 mg | ORAL_TABLET | Freq: Four times a day (QID) | ORAL | Status: DC | PRN
  Administered 2019-05-06 – 2019-05-08 (×3): 650 mg via ORAL
  Filled 2019-05-06 (×3): qty 2

## 2019-05-06 MED ORDER — TRAMADOL HCL 50 MG PO TABS
50.0000 mg | ORAL_TABLET | Freq: Two times a day (BID) | ORAL | Status: DC | PRN
  Administered 2019-05-06 – 2019-05-07 (×3): 100 mg via ORAL
  Filled 2019-05-06 (×4): qty 2

## 2019-05-06 MED ORDER — INSULIN GLARGINE 100 UNIT/ML ~~LOC~~ SOLN
15.0000 [IU] | Freq: Every day | SUBCUTANEOUS | Status: DC
  Administered 2019-05-06 – 2019-05-08 (×3): 15 [IU] via SUBCUTANEOUS
  Filled 2019-05-06 (×3): qty 0.15

## 2019-05-06 MED ORDER — ENOXAPARIN SODIUM 40 MG/0.4ML ~~LOC~~ SOLN
40.0000 mg | Freq: Every day | SUBCUTANEOUS | Status: DC
  Administered 2019-05-06 – 2019-05-08 (×3): 40 mg via SUBCUTANEOUS
  Filled 2019-05-06 (×3): qty 0.4

## 2019-05-06 MED ORDER — LISINOPRIL 2.5 MG PO TABS
2.5000 mg | ORAL_TABLET | Freq: Every day | ORAL | Status: DC
  Administered 2019-05-07 – 2019-05-08 (×2): 2.5 mg via ORAL
  Filled 2019-05-06 (×3): qty 1

## 2019-05-06 MED ORDER — ATORVASTATIN CALCIUM 40 MG PO TABS
40.0000 mg | ORAL_TABLET | Freq: Every day | ORAL | Status: DC
  Administered 2019-05-06 – 2019-05-07 (×2): 40 mg via ORAL
  Filled 2019-05-06 (×2): qty 1

## 2019-05-06 MED ORDER — METOCLOPRAMIDE HCL 5 MG PO TABS
5.0000 mg | ORAL_TABLET | Freq: Three times a day (TID) | ORAL | Status: DC
  Administered 2019-05-06 – 2019-05-08 (×8): 5 mg via ORAL
  Filled 2019-05-06 (×7): qty 1

## 2019-05-06 MED ORDER — SENNOSIDES-DOCUSATE SODIUM 8.6-50 MG PO TABS: 1.0000 | ORAL_TABLET | Freq: Two times a day (BID) | ORAL | Status: DC | PRN

## 2019-05-06 MED ORDER — ONDANSETRON HCL 4 MG PO TABS
4.0000 mg | ORAL_TABLET | Freq: Four times a day (QID) | ORAL | Status: DC | PRN
  Administered 2019-05-06 – 2019-05-08 (×2): 4 mg via ORAL
  Filled 2019-05-06 (×2): qty 1

## 2019-05-06 MED ORDER — INSULIN GLARGINE 100 UNIT/ML ~~LOC~~ SOLN
20.0000 [IU] | Freq: Once | SUBCUTANEOUS | Status: AC
  Administered 2019-05-06: 20 [IU] via SUBCUTANEOUS
  Filled 2019-05-06 (×2): qty 0.2

## 2019-05-06 MED ORDER — DEXTROSE IN LACTATED RINGERS 5 % IV SOLN: INTRAVENOUS | Status: DC

## 2019-05-06 MED ORDER — ACETAMINOPHEN 650 MG RE SUPP: 650.0000 mg | Freq: Four times a day (QID) | RECTAL | Status: DC | PRN

## 2019-05-06 MED ORDER — LACTATED RINGERS IV SOLN: INTRAVENOUS | Status: DC

## 2019-05-06 MED ORDER — INSULIN ASPART 100 UNIT/ML ~~LOC~~ SOLN
12.0000 [IU] | Freq: Once | SUBCUTANEOUS | Status: AC
  Administered 2019-05-06: 12 [IU] via SUBCUTANEOUS

## 2019-05-06 MED ORDER — GABAPENTIN 300 MG PO CAPS
900.0000 mg | ORAL_CAPSULE | Freq: Every day | ORAL | Status: DC
  Administered 2019-05-06 – 2019-05-07 (×2): 900 mg via ORAL
  Filled 2019-05-06 (×3): qty 3

## 2019-05-06 MED ORDER — GABAPENTIN 300 MG PO CAPS
300.0000 mg | ORAL_CAPSULE | Freq: Two times a day (BID) | ORAL | Status: DC
  Administered 2019-05-07 – 2019-05-08 (×3): 300 mg via ORAL
  Filled 2019-05-06 (×2): qty 1

## 2019-05-06 NOTE — Progress Notes (Signed)
Patient is refusing to use urinal for safety and urine specimen collection reasons. Day RN made aware.

## 2019-05-06 NOTE — Progress Notes (Signed)
Inpatient Diabetes Program Recommendations  AACE/ADA: New Consensus Statement on Inpatient Glycemic Control (2015)  Target Ranges:  Prepandial:   less than 140 mg/dL      Peak postprandial:   less than 180 mg/dL (1-2 hours)      Critically ill patients:  140 - 180 mg/dL   Lab Results  Component Value Date   GLUCAP 177 (H) 05/06/2019   HGBA1C 9.1 (H) 04/01/2019    Review of Glycemic Control  Diabetes history: DM1 Outpatient Diabetes medications: Lantus 25 units QD, Novolog 4 units tidwc for meal coverage insulin Current orders for Inpatient glycemic control: Lantus 15 units QD   HgbA1C - 9.1%. On CHO mod diet Type 1 DM, makes no insulin, therefore will need meal coverage and correction  Inpatient Diabetes Program Recommendations:    Add Novolog 0-9 units tidwc and hs Add Novolog 4 units tidwc for meal coverage insulin if pt eats > 50% meal.  Needs tighter glycemic control at home. Will need f/u with PCP for diabetes management.  Continue to follow.  Thank you. Lorenda Peck, RD, LDN, CDE Inpatient Diabetes Coordinator [phone]

## 2019-05-06 NOTE — Progress Notes (Addendum)
Patient has arrived to unit. Spoke to police regarding warrant. Told to put in patients chart and to call off duty once patient was being discharged. RN was able to have some assessment questions answered. Other questions were not answered by patient after multiple attempts. RN was able to complete admission. Patient is resting quietly in bed with covers over his bed asking [place] to leave him alone. Patients vital signs are within normal limits. Current CBG is 165. When asking if he needed anything, he answered "no". Beverage, snack, conversation, and extra linen offered. Pt declined. RN initiated not allowing staff to go in the room by themselves and to use the buddy system. RN and NT assisted lab with blood draw. RN called to verify q1h CBG checks with MD. MD stated this was necessary for his care. RN called AC to verify this frequency was allowed on this unit. AC stated it was allowed.

## 2019-05-06 NOTE — ED Provider Notes (Signed)
Patient signed out to me at shift change.  Was found to be hypoglycemic in hotel room.  Poor control of type 1 DM.  Reportedly doesn't check his glucose prior to administering insulin.  EMS called to hotel for AMS/confusion.  Was hypoglycemic.   Here he has had some persistent hypoglycemia.  Is now on dextrose infusion.  Became aggressive earlier and was given ativan by prior team.  Now he is somnolent but arousable to voice.  He states that he is ok with being admitted to the hospital for monitoring of blood glucose.  Appreciate Dr. Alcario Drought for admitting the patient.     Montine Circle, PA-C 05/06/19 0109    Mol[place]us, Jenny Reichmann, MD 05/06/19 [phone]

## 2019-05-06 NOTE — Progress Notes (Signed)
Patient refused to ambulate in hallway at this time. Will attempt again

## 2019-05-06 NOTE — ED Notes (Signed)
While obtaining patients blood sugar , this patient began being very aggressive with this writer and jerking his hand back. Pt stated "I dont want you touching me, get away from me you fucking bitch. Your a bitch" This Probation officer told the patient that he is not to talk to staff that way when all we are trying to do is help him.

## 2019-05-06 NOTE — Progress Notes (Signed)
Patient refused Probation officer to listen to him. Will continue to attempt

## 2019-05-06 NOTE — ED Notes (Signed)
Entered pt room to give ordered medications. Pt had pulled out IV and threw it across the room. Pt covered in blood, and blood is thrown around the room. Provider made aware.

## 2019-05-06 NOTE — Progress Notes (Signed)
Patient CBG is 426 @ 2113. Patient was told by NT not to eat due to high CBG. No orders currently for NPO. He is carb mod. No orders for insulin coverage. Admitted for hypoglycemia. Prior CBG at 1710 was 424. MD paged. Awaiting new orders.

## 2019-05-06 NOTE — H&P (Signed)
History and Physical    [name]:[id number] DOB: 08/21/1955 DOA: 05/05/2019  PCP: Patient, No Pcp Per  Patient coming from: [facility], homeless  I have personally briefly reviewed patient's old medical records in [place]  Chief Complaint: Hypoglycemia  HPI: [name] is a 64 y.o. male with medical history significant of DM1, polysubstance abuse, HCV.  Patient arrives to ED via EMS from [place].  EMS called out for AMS.  CBG 58 on arrival, given IV dextrose, CBG went to 160, mental status improved.   ED Course: Mental status improved in ED to point where patient was cursing at nursing staff and refusing further care, so EDP sedated patient with ativan.  CBG trended back down to 29, D50 again -> 144, then trended back down to 68, D50 again -> 200, now 159.   Review of Systems: As per HPI, otherwise all review of systems negative.  Past Medical History:  Diagnosis Date  . DEPRESSION   . DIABETES MELLITUS, TYPE I   . DRUG ABUSE    pt should have NO controlled substances rx'ed  . Glaucoma   . HEPATITIS C    chronic  . Heroin overdose ([place]) 10/03/2018  . Heroin use 10/03/2018  . Hypertension 02/18/2011  . 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 Vitiligo     Past Surgical History:  Procedure Laterality Date  . ABDOMINAL AORTOGRAM W/LOWER EXTREMITY N/A 07/25/2016   Procedure: Abdominal Aortogram w/Bilateral Lower Extremity Runoff;  Surgeon: Conrad Forksville, MD;  Location: [place] [facility];  Service: Cardiovascular;  Laterality: N/A;  . ABDOMINAL AORTOGRAM W/LOWER EXTREMITY N/A 12/24/2016   Procedure: ABDOMINAL AORTOGRAM W/LOWER EXTREMITY;  Surgeon: Serafina Mitchell, MD;  Location: [place] [facility];  Service: Cardiovascular;  Laterality: N/A;  . AMPUTATION TOE Right 07/26/2016   Procedure: AMPUTATION GREAT TOE;  Surgeon: Serafina Mitchell, MD;  Location: [place];  Service: Vascular;  Laterality: Right;  . BALLOON DILATION N/A 04/22/2019   Procedure: BALLOON DILATION;   Surgeon: Irene Shipper, MD;  Location: [facility] ENDOSCOPY;  Service: Endoscopy;  Laterality: N/A;  . BIOPSY  08/25/2018   Procedure: BIOPSY;  Surgeon: Irving Copas., MD;  Location: [place];  Service: Gastroenterology;;  . BIOPSY  11/16/2018   Procedure: BIOPSY;  Surgeon: Jerene Bears, MD;  Location: [facility] ENDOSCOPY;  Service: Gastroenterology;;  . ESOPHAGOGASTRODUODENOSCOPY N/A 08/25/2018   Procedure: ESOPHAGOGASTRODUODENOSCOPY (EGD);  Surgeon: Irving Copas., MD;  Location: [place];  Service: Gastroenterology;  Laterality: N/A;  . ESOPHAGOGASTRODUODENOSCOPY (EGD) WITH PROPOFOL N/A 11/16/2018   Procedure: ESOPHAGOGASTRODUODENOSCOPY (EGD) WITH PROPOFOL;  Surgeon: Jerene Bears, MD;  Location: [facility] ENDOSCOPY;  Service: Gastroenterology;  Laterality: N/A;  . ESOPHAGOGASTRODUODENOSCOPY (EGD) WITH PROPOFOL N/A 04/22/2019   Procedure: ESOPHAGOGASTRODUODENOSCOPY (EGD) WITH PROPOFOL;  Surgeon: Irene Shipper, MD;  Location: [facility] ENDOSCOPY;  Service: Endoscopy;  Laterality: N/A;  . EYE SURGERY     retinal surgery x 2, right eye  . INSERTION OF ILIAC STENT Right 07/26/2016   Procedure: INSERTION OF RIGHT POPLITEAL STENT WITH BALLOON ANGIOPLASTY;  Surgeon: Serafina Mitchell, MD;  Location: [place];  Service: Vascular;  Laterality: Right;  . LOWER EXTREMITY ANGIOGRAM Right 07/26/2016   Procedure: LOWER EXTREMITY ANGIOGRAM;  Surgeon: Serafina Mitchell, MD;  Location: [place];  Service: Vascular;  Laterality: Right;  . PERIPHERAL VASCULAR BALLOON ANGIOPLASTY Right 07/26/2016   Procedure: PERIPHERAL VASCULAR BALLOON ANGIOPLASTY  RIGHT ANTERIOR TIBEAL ARTERY AND RIGHT SUPERFICIAL FEMORAL ARTERY;  Surgeon: Butch Penny  Trula Slade, MD;  Location: [facility];  Service: Vascular;  Laterality: Right;  . PERIPHERAL VASCULAR INTERVENTION Right 12/24/2016   Procedure: PERIPHERAL VASCULAR INTERVENTION;  Surgeon: Serafina Mitchell, MD;  Location: [place] [facility];  Service: Cardiovascular;  Laterality: Right;  lower extr     reports  that he has been smoking cigarettes. He has a 51.00 pack-year smoking history. He quit smokeless tobacco use about 36 years ago.  His smokeless tobacco use included chew. He reports current drug use. Drugs: Marijuana, Heroin, Cocaine, "Crack" cocaine, Fentanyl, and Methylphenidate. He reports that he does not drink alcohol.  Allergies  Allergen Reactions  . Codeine Itching and Hives    Tolerates hydrocodone/apap    Family History  Problem Relation Age of Onset  . Diabetes Father   . Kidney disease Father   . Arthritis Mother   . Heart disease Mother        CAD  . Colon cancer Neg Hx   . Esophageal cancer Neg Hx   . Inflammatory bowel disease Neg Hx   . Liver disease Neg Hx   . Pancreatic cancer Neg Hx   . Rectal cancer Neg Hx   . Stomach cancer Neg Hx      Prior to Admission medications   Medication Sig Start Date End Date Taking? Authorizing Provider  Accu-Chek Softclix Lancets lancets Use to monitor glucose levels BID; E10.29 04/22/19   Swayze, Ava, DO  acetaminophen (TYLENOL) 325 MG tablet Take 2 tablets (650 mg total) by mouth every 6 (six) hours as needed for mild pain or headache. 04/07/19   Mercy Riding, MD  amLODipine (NORVASC) 10 MG tablet Take 1 tablet (10 mg total) by mouth daily. 04/22/19   Swayze, Ava, DO  atorvastatin (LIPITOR) 40 MG tablet Take 1 tablet (40 mg total) by mouth daily at 6 PM. 04/22/19   Swayze, Ava, DO  Blood Glucose Calibration (ACCU-CHEK AVIVA) SOLN 1 each by In Vitro route as needed. Use to calibrate meter prn 08/19/18   Renato Shin, MD  blood glucose meter kit and supplies KIT Dispense based on patient and insurance preference. Use up to four times daily as directed. (FOR ICD-9 250.00, 250.01). 08/15/18   Cristal Ford, DO  Blood Glucose Monitoring Suppl (ACCU-CHEK AVIVA PLUS) w/Device KIT 1 each by Does not apply route 2 (two) times a day. Use to monitor glucose levels BID; E10.29 08/19/18   Renato Shin, MD  ferrous sulfate 325 (65 FE) MG tablet Take 1  tablet (325 mg total) by mouth daily with breakfast. 04/22/19   Swayze, Ava, DO  gabapentin (NEURONTIN) 300 MG capsule Take 1 capsule (300 mg total) by mouth 2 (two) times daily at 10 am and 4 pm AND 3 capsules (900 mg total) at bedtime. 04/22/19   Swayze, Ava, DO  glucose blood (ACCU-CHEK AVIVA PLUS) test strip Use to monitor glucose levels BID; E10.29 04/22/19   Swayze, Ava, DO  insulin aspart (NOVOLOG) 100 UNIT/ML injection Inject 4 Units into the skin 3 (three) times daily with meals. If he eats greater than 50% of his meals 04/22/19   Swayze, Ava, DO  insulin aspart (NOVOLOG) 100 UNIT/ML injection Glucose < 70: implement hypoglycemia protocol Glucose 70 - 120: 0 units Glucose 121 - 150: 2 units Glucose 151 - 200: 3 units Glucose 201 - 250: 5 units Glucose 251 - 300: 8 units Glucose 301 - 350: 11 units Glucose 351 - 400: 15 units and call your doctor or go to emergency department  5.  Correction coverage at bedtime (9pm) Glucose < 70: implement hypoglycemia protocol Glucose  70 - 120: 0 units Glucose 121 - 150: 0 units Glucose 151 - 200: 0 units Glucose 201 - 250: 2 units Glucose 251 - 300: 3 units Glucose 301 - 350: 4 units and call you doctor or go to emergency department 04/22/19   Swayze, Ava, DO  insulin glargine (LANTUS) 100 UNIT/ML injection Inject 0.25 mLs (25 Units total) into the skin daily. 04/22/19   Swayze, Ava, DO  insulin glargine (LANTUS) 100 UNIT/ML injection Inject 0.07 mLs (7 Units total) into the skin 2 (two) times daily. 04/22/19   Swayze, Ava, DO  Insulin Pen Needle 31G X 5 MM MISC Use with insulin pen as directed 04/22/19   Swayze, Ava, DO  lisinopril (ZESTRIL) 2.5 MG tablet Take 1 tablet (2.5 mg total) by mouth daily. 04/23/19   Swayze, Ava, DO  nicotine (NICODERM CQ - DOSED IN MG/24 HOURS) 21 mg/24hr patch Place 1 patch (21 mg total) onto the skin daily. 04/08/19   Mercy Riding, MD  oxyCODONE-acetaminophen (PERCOCET/ROXICET) 5-325 MG tablet Take 1 tablet by mouth every 6 (six)  hours as needed for severe pain. 04/22/19   Swayze, Ava, DO  pantoprazole (PROTONIX) 40 MG tablet Take 1 tablet (40 mg total) by mouth 2 (two) times daily. 04/22/19   Swayze, Ava, DO  senna-docusate (SENOKOT-S) 8.6-50 MG tablet Take 1 tablet by mouth 2 (two) times daily as needed for moderate constipation. 04/07/19   Mercy Riding, MD  senna-docusate (SENOKOT-S) 8.6-50 MG tablet Take 2 tablets by mouth 2 (two) times daily. 04/22/19   Swayze, Ava, DO    Physical Exam: Vitals:   05/05/19 2356 05/06/19 0000 05/06/19 0030 05/06/19 0100  BP:  (!) 146/67 (!) 116/56 (!) 130/53  Pulse: 95 95 90 92  Resp:      Temp:      TempSrc:      SpO2: 92% 94% 96% 96%    Constitutional: NAD, calm, comfortable Eyes: PERRL, lids and conjunctivae normal ENMT: Mucous membranes are moist. Posterior pharynx clear of any exudate or lesions.Normal dentition.  Neck: normal, supple, no masses, no thyromegaly Respiratory: clear to auscultation bilaterally, no wheezing, no crackles. Normal respiratory effort. No accessory muscle use.  Cardiovascular: Regular rate and rhythm, no murmurs / rubs / gallops. No extremity edema. 2+ pedal pulses. No carotid bruits.  Abdomen: no tenderness, no masses palpated. No hepatosplenomegaly. Bowel sounds positive.  Musculoskeletal: no clubbing / cyanosis. No joint deformity upper and lower extremities. Good ROM, no contractures. Normal muscle tone.  Skin: no rashes, lesions, ulcers. No induration Neurologic: CN 2-12 grossly intact. Sensation intact, DTR normal. Strength 5/5 in all 4.  Psychiatric: Sedated from [place].   Labs on Admission: I have personally reviewed following labs and imaging studies  CBC: Recent Labs  Lab 05/05/19 2025  WBC 17.7*  NEUTROABS 16.0*  HGB 9.9*  HCT 35.7*  MCV 99.2  PLT 979   Basic Metabolic Panel: Recent Labs  Lab 05/05/19 2025  NA 139  K 3.7  CL 103  CO2 26  GLUCOSE 143*  BUN 16  CREATININE 1.23  CALCIUM 9.6   GFR: Estimated  Creatinine Clearance: 60.6 mL/min (by C-G formula based on SCr of 1.23 mg/dL). Liver Function Tests: Recent Labs  Lab 05/05/19 2025  AST 24  ALT 23  ALKPHOS 94  BILITOT 0.3  PROT 7.3  ALBUMIN 3.4*   No results for input(s): LIPASE, AMYLASE in the  last 168 hours. Recent Labs  Lab 05/05/19 2025  AMMONIA 26   Coagulation Profile: No results for input(s): INR, PROTIME in the last 168 hours. Cardiac Enzymes: No results for input(s): CKTOTAL, CKMB, CKMBINDEX, TROPONINI in the last 168 hours. BNP (last 3 results) No results for input(s): PROBNP in the last 8760 hours. HbA1C: No results for input(s): HGBA1C in the last 72 hours. CBG: Recent Labs  Lab 05/05/19 2010 05/05/19 2142 05/05/19 2314 05/06/19 0003 05/06/19 0116  GLUCAP 144* 89 68* 200* 159*   Lipid Profile: No results for input(s): CHOL, HDL, LDLCALC, TRIG, CHOLHDL, LDLDIRECT in the last 72 hours. Thyroid Function Tests: No results for input(s): TSH, T4TOTAL, FREET4, T3FREE, THYROIDAB in the last 72 hours. Anemia Panel: No results for input(s): VITAMINB12, FOLATE, FERRITIN, TIBC, IRON, RETICCTPCT in the last 72 hours. Urine analysis:    Component Value Date/Time   COLORURINE YELLOW 03/31/2019 0310   APPEARANCEUR CLEAR 03/31/2019 0310   LABSPEC 1.020 03/31/2019 0310   PHURINE 6.0 03/31/2019 0310   GLUCOSEU >=500 (A) 03/31/2019 0310   GLUCOSEU 500 04/28/2012 1149   HGBUR NEGATIVE 03/31/2019 0310   BILIRUBINUR NEGATIVE 03/31/2019 0310   BILIRUBINUR neg 04/28/2012 1053   KETONESUR NEGATIVE 03/31/2019 0310   PROTEINUR 100 (A) 03/31/2019 0310   UROBILINOGEN 4.0 (H) 11/16/2013 1646   NITRITE NEGATIVE 03/31/2019 0310   LEUKOCYTESUR NEGATIVE 03/31/2019 0310    Radiological Exams on Admission: CT Head Wo Contrast  Result Date: 05/06/2019 CLINICAL DATA:  Delirium EXAM: CT HEAD WITHOUT CONTRAST TECHNIQUE: Contiguous axial images were obtained from the base of the skull through the vertex without intravenous  contrast. COMPARISON:  11/11/2018 FINDINGS: Brain: There is no mass, hemorrhage or extra-axial collection. There is generalized atrophy without lobar predilection. Hypodensity of the white matter is most commonly associated with chronic microvascular disease. Vascular: Atherosclerotic calcification of the vertebral arteries at the skull base. No abnormal hyperdensity of the major intracranial arteries or dural venous sinuses. Skull: The visualized skull base, calvarium and extracranial soft tissues are normal. Sinuses/Orbits: Right mastoid effusion. The orbits are normal. IMPRESSION: 1. No acute intracranial abnormality. 2. Right mastoid effusion. Electronically Signed   By: Ulyses Jarred M.D.   On: 05/06/2019 00:46   DG Chest Portable 1 View  Result Date: 05/06/2019 CLINICAL DATA:  Cough. Altered mental status. EXAM: PORTABLE CHEST 1 VIEW COMPARISON:  Radiograph 03/30/2019, chest CT 03/31/2019 FINDINGS: Previous right upper lobe masslike opacity has resolved. Heart is normal in size. Normal mediastinal contours. Mild bronchial thickening, new. No new airspace opacity. No pleural fluid or pneumothorax. No acute osseous abnormalities. IMPRESSION: 1. New bronchial thickening since last month, suggesting bronchitis. 2. Previous right upper lobe masslike opacity has resolved, likely representing pneumonia. Electronically Signed   By: Keith Rake M.D.   On: 05/06/2019 00:49    EKG: Independently reviewed.  Assessment/Plan Principal Problem:   Hypoglycemia due to type 1 diabetes mellitus ([place]) Active Problems:   Substance abuse ([place])    1. Hypoglycemia due to insulin for DM1 - 1. Holding all insulin for the moment 2. CBG checks Q1H 3. Treat hypoglycemia PRN until CBG trend is upward at which point will need to restart insulins 4. Diabetes coordinator consult, looks like they were having trouble with labile BGLs during last admission earlier this month too. 2. Polysubstance abuse - 1. Avoid  narcotics or other controlled substances if possible.  DVT prophylaxis: Lovenox Code Status: Full Family Communication: No family in room Disposition Plan: Home after admit Consults called:  none Admission status: Place in obs    Kaysin Brock, [place] Hospital[facility]  How to contact the [facility] Attending or Consulting provider [place] or covering provider during after hours [place], for this patient?  1. Check the care team in [facility] and look for a) attending/consulting TR[facility] provider listed and b) the [facility] team listed 2. Log into [email or web address]  Amion Physician Scheduling and messaging for groups and whole hospitals  On call and physician scheduling software for group practices, residents, hospitalists and other medical providers for call, clinic, rotation and shift schedules. On[employer] is a hospital-wide system for scheduling doctors and paging doctors on call. EasyPlot is for scientific plotting and data analysis.  [email or web address]  and use [place]'s universal password to access. If you do not have the password, please contact the hospital operator.  3. Locate the [facility] provider you are looking for under [name] and page to a number that you can be directly reached. 4. If you still have difficulty reaching the provider, please page the [facility] (Director on Call) for the Hospitalists listed on amion for assistance.  05/06/2019, 1:30 AM
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
## 2019-05-06 NOTE — ED Notes (Signed)
Pt yelling and cursing at staff after being told he would need to change into a gown and have another IV placed in order to be admitted.

## 2019-05-06 NOTE — Progress Notes (Signed)
NT and writer went in patient's room to check his blood sugar. Patient cursing at staff. Refused CBG

## 2019-05-06 NOTE — Progress Notes (Signed)
Writer attempted again to listen to patient heart and lungs, ABD. Patient refused. Patient states, " Let me just die."

## 2019-05-06 NOTE — Progress Notes (Addendum)
PROGRESS NOTE  [name] WGN:[id number] DOB: Nov 14, 1955 DOA: 05/05/2019 PCP: Patient, No Pcp Per  Brief History   [name] is a 64 y.o. male with medical history significant of DM1, polysubstance abuse, HCV.  Patient arrives to ED via EMS from [place].  EMS called out for AMS.  CBG 58 on arrival, given IV dextrose, CBG went to 160, mental status improved.   ED Course: Mental status improved in ED to point where patient was cursing at nursing staff and refusing further care, so EDP sedated patient with ativan.  CBG trended back down to 29, D50 again -> 144, then trended back down to 68, D50 again -> 200, now 159.  The patient was admitted to a telemetry bed with a D15 drip for 1 hour. Since it was discontinued, his glucoses have run 165 - 205. He has had nausea and vomiting all morning. He states that he has not eaten for the last 5 days. He states that he does not have money for food. He is complaining of pain. He told nursing that it was headache pain. He then denied to me that he had a headache. He complains of chronic pain and is demanding oxycodone.   The patient has a history of polysubstance abuse including heroine. He has been offered methadone, but he has declined unless it is accompanied by oxycodone. He has stated that he will leave AMA if he does not receive oxycodone. However, the patient is to go to prison following discharge. Leaving AMA seems to be an unlikely option.The patient has had incontinence of stool and vomiting this morning. He denies that this would be due to opioid withdrawal.   Due to the patient's continued vomiting, I have ordered an abdominal x-ray. If it does not reveal a cause for the patient's symptoms, I will do an esophagram. During the patient's last admission during which I was also this patient's attending, he was found to have achalasia and underwent dilatation of his esophagus.   The patient has used abusive and offensive language in dealing with  nursing staff. He has been advised that this is not acceptable, and that he is to behave with respect to all who are here to help him.  Consultants  . None  Procedures  . None  Antibiotics   Anti-infectives (From admission, onward)   None    .   Subjective  The patient is lying in bed. He has been refusing oral medications and demanding narcotic pain medication. He has been using offensive and abusive language to staff. He has had nausea, vomiting, and diarrhea. He denies that this is due to opioid withdrawal. He states that he has not eaten for 5 days due to lack of access to food.  Objective   Vitals:  Vitals:   05/06/19 0301 05/06/19 0620  BP: (!) 111/50 (!) 144/58  Pulse: 86 83  Resp: 15 14  Temp: 100 F (37.8 C) 98.6 F (37 C)  SpO2: 95% 96%   Exam:  Constitutional:  . The patient is lying in bed on his side. He is "playing opossum" with his eyes closed. He is mostly not responding as I speak to him, although he will occasionally answer my questions. He does not appear to be in any acute distress. Respiratory:  . No increased work of breathing. . No wheezes, rales, or rhonchi . No tactile fremitus Cardiovascular:  . Regular rate and rhythm . No murmurs, ectopy, or gallups. . No lateral PMI. No  thrills. Abdomen:  . Abdomen is soft, non-tender, non-distended . No hernias, masses, or organomegaly . Hypoactive bowel sounds.  Musculoskeletal:  . No cyanosis, clubbing, or edema Skin:  . No rashes, lesions, ulcers . palpation of skin: no induration or nodules Neurologic:  . CN 2-12 intact . Sensation all 4 extremities intact Psychiatric:  . Mental status: Oppositional. o Mood, affect appropriate o Orientation to person, place, time   I have personally reviewed the following:   Today's Data  . Vitals, BMP  Imaging  . CXR: Bronchial thickening, bronchitis .           Resolution of right upper lobe mass-like opacity  Scheduled Meds: . atorvastatin  40  mg Oral q1800  . enoxaparin (LOVENOX) injection  40 mg Subcutaneous Daily  . gabapentin  900 mg Oral QHS  . insulin glargine  15 Units Subcutaneous Daily  . ketorolac  30 mg Intravenous Q8H  . lisinopril  2.5 mg Oral Daily  . metoCLOPramide  5 mg Oral TID AC & HS  . nicotine  21 mg Transdermal Daily  . sodium chloride flush  3 mL Intravenous Q12H   Continuous Infusions: . sodium chloride    . 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 The patient states that he has not had access to food for 5-6 days. He also has severe nausea and vomiting. He does not have an anion gap. Hypoglycemia resolved with IV D5 for 8 hours. He now is unable to keep anything down. I have put him on D5 LR. Will monitor FSBS for hypoglycemia. Dose of lantus has been reduced to 15 units. X-ray of the abdomen will be performed to investigate nausea and vomiting. The patient denies that this may be due to withdrawal from opiates, although he is actively demanding oxycodone. He has refused methadone unless he also gets oxycodone.  Nausea/vomiting/diarrhea: DDx: obstruction, enteritis, opiate withdrawal, esophagitis/achalasia. Abdominal x-ray is pending. If does not indicate a cause for nausea or vomiting, will check an esophagram. Will also order an enteric pathogen panel. The patient will receive D5 LR until he is able to tolerate PO. He has declined a trial of methadone for treatment of possible opiate withdrawal.  Esophagitis with history of esophageal stricture: Protonix bid. Check esophagram if abdominal film does not indicate a cause for nausea and  vomiting.  COPD with acute bronchitis: The patient has a slight, ineffectual cough. Small wheezes on auscultation of chest. CXR suggests bronchitis. Will give the patient azithromycin and neb treatments. Will avoid steroids due to the patient's issues with esophagitis.  DM I: Uncontrolled with a history of DKA/Hypoglycemia: The patient's lantus has been reduced to 15 units daily while he is unable to take PO. His blood sugars will be checked q4H while he is NPO. He will be kept NPO for now due to vomiting.   Severe protein calorie malnutrition: This is likely due to a combination of 1) uncontrolled diabetes 2) polysubstance abuse 3) longstanding esophagitis with achalasia 4)impaired access to nutrition. Will consult nutrition once the patient is able to take PO.  I have seen and examined this patient myself. I have spent 42 minutes in  his care and evaluation. More than 50% of this has been spent in coordination of care with nursing.  Severity of Illness: The appropriate patient status for this patient is INPATIENT. Inpatient status is judged to be reasonable and necessary in order to provide the required intensity of service to ensure the patient's safety. The patient's presenting symptoms, physical exam findings, and initial radiographic and laboratory data in the context of their chronic comorbidities is felt to place them at high risk for further clinical deterioration. Furthermore, it is not anticipated that the patient will be medically stable for discharge from the hospital within 2 midnights of admission. The following factors support the patient status of inpatient.   " The patient's presenting symptoms include hypoglycemia, nausea/vomiting/diarrhea. " The worrisome physical exam findings include cachexia, hypoactive bowel sounds. " The initial radiographic and laboratory data are worrisome because of hypoglycemia, acute bronchitis. " The chronic co-morbidities include Uncontrolled DM I,  Polysubstance abuse, esophagitis with achalasia.   * I certify that at the point of admission it is my clinical judgment that the patient will require inpatient hospital care spanning beyond 2 midnights from the point of admission due to high intensity of service, high risk for further deterioration and high frequency of surveillance required.*  Tamico Mundo, DO [name] Direct contact: see [email or web address]  7PM-7AM contact night coverage as above 05/06/2019, 1:56 PM  LOS: 0 days

## 2019-05-07 ENCOUNTER — Inpatient Hospital Stay (HOSPITAL_COMMUNITY): Payer: Medicare HMO

## 2019-05-07 LAB — GLUCOSE, CAPILLARY
Glucose-Capillary: 124 mg/dL — ABNORMAL HIGH (ref 70–99)
Glucose-Capillary: 142 mg/dL — ABNORMAL HIGH (ref 70–99)
Glucose-Capillary: 151 mg/dL — ABNORMAL HIGH (ref 70–99)
Glucose-Capillary: 158 mg/dL — ABNORMAL HIGH (ref 70–99)
Glucose-Capillary: 22 mg/dL — CL (ref 70–99)
Glucose-Capillary: 265 mg/dL — ABNORMAL HIGH (ref 70–99)
Glucose-Capillary: 284 mg/dL — ABNORMAL HIGH (ref 70–99)
Glucose-Capillary: 337 mg/dL — ABNORMAL HIGH (ref 70–99)

## 2019-05-07 LAB — CBC
HCT: 34.1 % — ABNORMAL LOW (ref 39.0–52.0)
Hemoglobin: 10.9 g/dL — ABNORMAL LOW (ref 13.0–17.0)
MCH: 27.3 pg (ref 26.0–34.0)
MCHC: 32 g/dL (ref 30.0–36.0)
MCV: 85.3 fL (ref 80.0–100.0)
Platelets: 386 10*3/uL (ref 150–400)
RBC: 4 MIL/uL — ABNORMAL LOW (ref 4.22–5.81)
RDW: 17.3 % — ABNORMAL HIGH (ref 11.5–15.5)
WBC: 10.5 10*3/uL (ref 4.0–10.5)
nRBC: 0 % (ref 0.0–0.2)

## 2019-05-07 LAB — BASIC METABOLIC PANEL
Anion gap: 8 (ref 5–15)
BUN: 29 mg/dL — ABNORMAL HIGH (ref 8–23)
CO2: 24 mmol/L (ref 22–32)
Calcium: 9 mg/dL (ref 8.9–10.3)
Chloride: 107 mmol/L (ref 98–111)
Creatinine, Ser: 1.55 mg/dL — ABNORMAL HIGH (ref 0.61–1.24)
GFR calc Af Amer: 55 mL/min — ABNORMAL LOW (ref >60)
GFR calc non Af Amer: 47 mL/min — ABNORMAL LOW (ref >60)
Glucose, Bld: 24 mg/dL — CL (ref 70–99)
Potassium: 3.7 mmol/L (ref 3.5–5.1)
Sodium: 139 mmol/L (ref 135–145)

## 2019-05-07 MED ORDER — DEXTROSE 50 % IV SOLN: 50.0000 mL | Freq: Once | INTRAVENOUS | Status: AC

## 2019-05-07 MED ORDER — DEXTROSE 50 % IV SOLN
INTRAVENOUS | Status: AC
  Administered 2019-05-07: 50 mL
  Filled 2019-05-07: qty 50

## 2019-05-07 MED ORDER — INSULIN ASPART 100 UNIT/ML ~~LOC~~ SOLN
4.0000 [IU] | Freq: Three times a day (TID) | SUBCUTANEOUS | Status: DC
  Administered 2019-05-07 – 2019-05-08 (×2): 4 [IU] via SUBCUTANEOUS

## 2019-05-07 MED ORDER — INSULIN ASPART 100 UNIT/ML ~~LOC~~ SOLN
4.0000 [IU] | Freq: Once | SUBCUTANEOUS | Status: AC
  Administered 2019-05-07: 21:00:00 4 [IU] via SUBCUTANEOUS

## 2019-05-07 MED ORDER — INSULIN ASPART 100 UNIT/ML ~~LOC~~ SOLN
0.0000 [IU] | Freq: Three times a day (TID) | SUBCUTANEOUS | Status: DC
  Administered 2019-05-07: 5 [IU] via SUBCUTANEOUS

## 2019-05-07 NOTE — Progress Notes (Addendum)
Rapid called when pt was found to have CBG of 22 @ 0440. Patient was unresponsive and pale. Vitals within normal limits. 50cc of D5 50 was given. Patient became alert and able to eat/drank within 5 minutes. AOx4. CBG was 158. MD made aware.

## 2019-05-07 NOTE — Progress Notes (Signed)
PROGRESS NOTE  TIVIS WHERRY WIO:[id number] DOB: 1955-09-11 DOA: 05/05/2019 PCP: Patient, No Pcp Per  Brief History   [name] is a 64 y.o. male with medical history significant of DM1, polysubstance abuse, HCV.  Patient arrives to ED via EMS from [place].  EMS called out for AMS.  CBG 58 on arrival, given IV dextrose, CBG went to 160, mental status improved.   ED Course: Mental status improved in ED to point where patient was cursing at nursing staff and refusing further care, so EDP sedated patient with ativan.  CBG trended back down to 29, D50 again -> 144, then trended back down to 68, D50 again -> 200, now 159.  The patient was admitted to a telemetry bed with a D15 drip for 1 hour. Since it was discontinued, his glucoses have run 165 - 205. He has had nausea and vomiting all morning. He states that he has not eaten for the last 5 days. He states that he does not have money for food. He is complaining of pain. He told nursing that it was headache pain. He then denied to me that he had a headache. He complains of chronic pain and is demanding oxycodone.   The patient has a history of polysubstance abuse including heroine. He has been offered methadone, but he has declined unless it is accompanied by oxycodone. He has stated that he will leave AMA if he does not receive oxycodone. However, the patient is to go to prison following discharge. Leaving AMA seems to be an unlikely option.The patient has had incontinence of stool and vomiting this morning. He denies that this would be due to opioid withdrawal.   Due to the patient's continued vomiting, I have ordered an abdominal x-ray. If it does not reveal a cause for the patient's symptoms, I will do an esophagram. During the patient's last admission during which I was also this patient's attending, he was found to have achalasia and underwent dilatation of his esophagus.   The patient has used abusive and offensive language in dealing with  nursing staff. He has been advised that this is not acceptable, and that he is to behave with respect to all who are here to help him.  DM I continues to be poorly controlled due primarily to dietary inconsistencies. The patient's glucose was 424 yesterday am after he ate only pudding and ice cream for lunch. He was given an additional 20 units of lantus for this. He was then hypoglycemic overnight after he states that he was told by nurse tech that she shouldn't eat anything for dinner due to high glucoses.   Consultants  . None  Procedures  . None  Antibiotics   Anti-infectives (From admission, onward)   None      Subjective  The patient is lying in bed. No new complaints.  Objective   Vitals:  Vitals:   05/07/19 0447 05/07/19 1324  BP: (!) 134/50 (!) 185/72  Pulse: 68 73  Resp:  18  Temp:  98.6 F (37 C)  SpO2: 98% 98%   Exam:  Constitutional:  The patient is awake, alert, and oriented x 3. No acute distress.  Respiratory:  . No increased work of breathing. . No wheezes, rales, or rhonchi . No tactile fremitus Cardiovascular:  . Regular rate and rhythm . No murmurs, ectopy, or gallups. . No lateral PMI. No thrills. Abdomen:  . Abdomen is soft, non-tender, non-distended . No hernias, masses, or organomegaly . Hypoactive bowel sounds.  Musculoskeletal:  . No cyanosis, clubbing, or edema Skin:  . No rashes, lesions, ulcers . palpation of skin: no induration or nodules Neurologic:  . CN 2-12 intact . Sensation all 4 extremities intact Psychiatric:  . Mental status: Mood and affect are congruent. o Mood, affect appropriate o Orientation to person, place, time   I have personally reviewed the following:   Today's Data  . Vitals, BMP  Imaging  . CXR: Bronchial thickening, bronchitis .           Resolution of right upper lobe mass-like opacity  Scheduled Meds: . atorvastatin  40 mg Oral q1800  . enoxaparin (LOVENOX) injection  40 mg Subcutaneous  Daily  . gabapentin  300 mg Oral BID  . gabapentin  900 mg Oral QHS  . insulin glargine  15 Units Subcutaneous Daily  . ketorolac  30 mg Intravenous Q8H  . lisinopril  2.5 mg Oral Daily  . metoCLOPramide  5 mg Oral TID AC & HS  . nicotine  21 mg Transdermal Daily   Continuous Infusions: . lactated ringers 125 mL/hr at 05/07/19 1203  . lactated ringers     A & P   No problems updated.Hypoglycemia COPD with Acute Bronchitis Polysubstance abuse Nausea/vomiting/diarrhea   LOS: 0 days   Hypoglycemia: Due to inadequate intake. The patient states that he has not had access to food for 5-6 days. He also has severe nausea and vomiting. He does not have an anion gap. Hypoglycemia resolved with IV D5 for 8 hours. He now is unable to keep anything down. I have put him on D5 LR. Will monitor FSBS for hypoglycemia. Dose of lantus has been reduced to 15 units. X-ray of the abdomen will be performed to investigate nausea and vomiting. The patient denies that this may be due to withdrawal from opiates, although he is actively demanding oxycodone. He has refused methadone unless he also gets oxycodone. Repeated hypoglycemia overnight on 05/06/2019 as the patient states that he was told not to eat anything last night due to elevated glucose (424) yesterday afternoon and he received increased lantus for that. Will continue to monitor glucose over next 24 hours.  Nausea/vomiting/diarrhea: DDx: obstruction, enteritis, opiate withdrawal, esophagitis/achalasia. Abdominal x-ray demonstrated no signs of obstruction or enteritis. Esophagram has been ordered to re-evaluate the patient's esophagus.Will also order an enteric pathogen panel. The patient will receive D5 LR until he is able to tolerate PO. He has declined a trial of methadone for treatment of possible opiate withdrawal.  Esophagitis with history of esophageal stricture: Protonix bid. Check esophagram if abdominal film does not indicate a cause for nausea  and vomiting.  COPD with acute bronchitis: The patient has a slight, ineffectual cough. Small wheezes on auscultation of chest. CXR suggests bronchitis. Will give the patient azithromycin and neb treatments. Will avoid steroids due to the patient's issues with esophagitis.  DM I: Uncontrolled with a history of DKA/Hypoglycemia: The patient's lantus has been reduced to 15 units daily while he is unable to take PO. On the dvise of the diabetic coordinator will add 4 units with meals. His blood sugars will be checked qas and hs.   Severe protein calorie malnutrition: This is likely due to a combination of 1) uncontrolled diabetes 2) polysubstance abuse 3) longstanding esophagitis with achalasia 4)impaired access to nutrition. Will consult nutrition once the patient is able to take PO.  I have seen and examined this patient myself. I have spent 32 minutes in his care and evaluation. More  than 50% of this has been spent in coordination of care with nursing.  Severity of Illness: The appropriate patient status for this patient is INPATIENT. Inpatient status is judged to be reasonable and necessary in order to provide the required intensity of service to ensure the patient's safety. The patient's presenting symptoms, physical exam findings, and initial radiographic and laboratory data in the context of their chronic comorbidities is felt to place them at high risk for further clinical deterioration. Furthermore, it is not anticipated that the patient will be medically stable for discharge from the hospital within 2 midnights of admission. The following factors support the patient status of inpatient.   " The patient's presenting symptoms include hypoglycemia, nausea/vomiting/diarrhea. " The worrisome physical exam findings include cachexia, hypoactive bowel sounds. " The initial radiographic and laboratory data are worrisome because of hypoglycemia, acute bronchitis. " The chronic co-morbidities include  Uncontrolled DM I, Polysubstance abuse, esophagitis with achalasia.   * I certify that at the point of admission it is my clinical judgment that the patient will require inpatient hospital care spanning beyond 2 midnights from the point of admission due to high intensity of service, high risk for further deterioration and high frequency of surveillance required.*  Laroy Mustard, DO [name] Direct contact: see [email or web address]  7PM-7AM contact night coverage as above 05/07/2019, 2:19 PM  LOS: 0 days

## 2019-05-07 NOTE — Consult Note (Signed)
   [facility] CM Inpatient Consult   05/07/2019  ISAC LINCKS 1955/12/12 [id number]   Patient was screened for possible [facility] Care Management (CM) servicesneeds due to unplanned readmission risk score, 51%, extreme and unplanned 30 day readmission.  Per chart review, patient does not have a primary provider. [facility] CM will not follow.  Netta Cedars, MSN, [facility] Liaison Nurse Mobile Phone [phone]  Toll free office [phone]

## 2019-05-07 NOTE — Progress Notes (Signed)
Inpatient Diabetes Program Recommendations  AACE/ADA: New Consensus Statement on Inpatient Glycemic Control (2015)  Target Ranges:  Prepandial:   less than 140 mg/dL      Peak postprandial:   less than 180 mg/dL (1-2 hours)      Critically ill patients:  140 - 180 mg/dL   Lab Results  Component Value Date   GLUCAP 284 (H) 05/07/2019   HGBA1C 9.1 (H) 04/01/2019    Review of Glycemic Control  Hypoglycemia of 22, 24 this am, likely from extra Lantus dose at 1700 and 12 units of Novolog at 2200.  Inpatient Diabetes Program Recommendations:     Lantus 15 units QD Novolog 0-9 units tidwc  Novolog 4 units tidwc for meal coverage insulin if pt eats > 50% meal  Continue to follow.  Thank you. Lorenda Peck, RD, LDN, CDE Inpatient Diabetes Coordinator [phone]

## 2019-05-08 ENCOUNTER — Emergency Department (HOSPITAL_COMMUNITY)
Admission: EM | Admit: 2019-05-08 | Discharge: 2019-05-09 | Disposition: A | Discharge status: Home / Self Care | Payer: Medicare HMO | Attending: Emergency Medicine | Admitting: Emergency Medicine

## 2019-05-08 ENCOUNTER — Other Ambulatory Visit: Payer: Self-pay

## 2019-05-08 ENCOUNTER — Emergency Department (HOSPITAL_COMMUNITY): Payer: Medicare HMO

## 2019-05-08 ENCOUNTER — Encounter (HOSPITAL_COMMUNITY): Payer: Self-pay

## 2019-05-08 DIAGNOSIS — F17210 Nicotine dependence, cigarettes, uncomplicated: Secondary | ICD-10-CM | POA: Insufficient documentation

## 2019-05-08 DIAGNOSIS — J449 Chronic obstructive pulmonary disease, unspecified: Secondary | ICD-10-CM | POA: Insufficient documentation

## 2019-05-08 DIAGNOSIS — I129 Hypertensive chronic kidney disease with stage 1 through stage 4 chronic kidney disease, or unspecified chronic kidney disease: Secondary | ICD-10-CM | POA: Insufficient documentation

## 2019-05-08 DIAGNOSIS — F1410 Cocaine abuse, uncomplicated: Secondary | ICD-10-CM | POA: Diagnosis not present

## 2019-05-08 DIAGNOSIS — R5383 Other fatigue: Secondary | ICD-10-CM | POA: Diagnosis not present

## 2019-05-08 DIAGNOSIS — N183 Chronic kidney disease, stage 3 unspecified: Secondary | ICD-10-CM | POA: Insufficient documentation

## 2019-05-08 DIAGNOSIS — Z79899 Other long term (current) drug therapy: Secondary | ICD-10-CM | POA: Insufficient documentation

## 2019-05-08 DIAGNOSIS — E1022 Type 1 diabetes mellitus with diabetic chronic kidney disease: Secondary | ICD-10-CM | POA: Diagnosis not present

## 2019-05-08 DIAGNOSIS — F1210 Cannabis abuse, uncomplicated: Secondary | ICD-10-CM | POA: Diagnosis not present

## 2019-05-08 DIAGNOSIS — Z794 Long term (current) use of insulin: Secondary | ICD-10-CM | POA: Diagnosis not present

## 2019-05-08 DIAGNOSIS — J9811 Atelectasis: Secondary | ICD-10-CM | POA: Diagnosis not present

## 2019-05-08 LAB — COMPREHENSIVE METABOLIC PANEL
ALT: 16 U/L (ref 0–44)
AST: 21 U/L (ref 15–41)
Albumin: 2.9 g/dL — ABNORMAL LOW (ref 3.5–5.0)
Alkaline Phosphatase: 79 U/L (ref 38–126)
Anion gap: 8 (ref 5–15)
BUN: 28 mg/dL — ABNORMAL HIGH (ref 8–23)
CO2: 24 mmol/L (ref 22–32)
Calcium: 8.8 mg/dL — ABNORMAL LOW (ref 8.9–10.3)
Chloride: 105 mmol/L (ref 98–111)
Creatinine, Ser: 1.36 mg/dL — ABNORMAL HIGH (ref 0.61–1.24)
GFR calc Af Amer: >60 mL/min (ref >60)
GFR calc non Af Amer: 55 mL/min — ABNORMAL LOW (ref >60)
Glucose, Bld: 145 mg/dL — ABNORMAL HIGH (ref 70–99)
Potassium: 5.1 mmol/L (ref 3.5–5.1)
Sodium: 137 mmol/L (ref 135–145)
Total Bilirubin: 1.2 mg/dL (ref 0.3–1.2)
Total Protein: 5.9 g/dL — ABNORMAL LOW (ref 6.5–8.1)

## 2019-05-08 LAB — CBC WITH DIFFERENTIAL/PLATELET
Abs Immature Granulocytes: 0.05 10*3/uL (ref 0.00–0.07)
Basophils Absolute: 0.1 10*3/uL (ref 0.0–0.1)
Basophils Relative: 1 %
Eosinophils Absolute: 0.5 10*3/uL (ref 0.0–0.5)
Eosinophils Relative: 3 %
HCT: 35.4 % — ABNORMAL LOW (ref 39.0–52.0)
Hemoglobin: 11.5 g/dL — ABNORMAL LOW (ref 13.0–17.0)
Immature Granulocytes: 0 %
Lymphocytes Relative: 11 %
Lymphs Abs: 1.5 10*3/uL (ref 0.7–4.0)
MCH: 27.7 pg (ref 26.0–34.0)
MCHC: 32.5 g/dL (ref 30.0–36.0)
MCV: 85.3 fL (ref 80.0–100.0)
Monocytes Absolute: 0.9 10*3/uL (ref 0.1–1.0)
Monocytes Relative: 6 %
Neutro Abs: 11.1 10*3/uL — ABNORMAL HIGH (ref 1.7–7.7)
Neutrophils Relative %: 79 %
Platelets: 342 10*3/uL (ref 150–400)
RBC: 4.15 MIL/uL — ABNORMAL LOW (ref 4.22–5.81)
RDW: 17.1 % — ABNORMAL HIGH (ref 11.5–15.5)
WBC: 14.1 10*3/uL — ABNORMAL HIGH (ref 4.0–10.5)
nRBC: 0 % (ref 0.0–0.2)

## 2019-05-08 LAB — CBG MONITORING, ED: Glucose-Capillary: 149 mg/dL — ABNORMAL HIGH (ref 70–99)

## 2019-05-08 LAB — MAGNESIUM: Magnesium: 1.9 mg/dL (ref 1.7–2.4)

## 2019-05-08 LAB — GLUCOSE, CAPILLARY
Glucose-Capillary: 98 mg/dL (ref 70–99)
Glucose-Capillary: 80 mg/dL (ref 70–99)

## 2019-05-08 MED ORDER — TRAMADOL HCL 50 MG PO TABS: 50.0000 mg | ORAL_TABLET | Freq: Two times a day (BID) | ORAL | 0 refills | Status: DC | PRN

## 2019-05-08 MED ORDER — INSULIN GLARGINE 100 UNIT/ML ~~LOC~~ SOLN: 15.0000 [IU] | Freq: Every day | SUBCUTANEOUS | 11 refills | Status: DC

## 2019-05-08 MED ORDER — ACETAMINOPHEN 500 MG PO TABS
1000.0000 mg | ORAL_TABLET | Freq: Once | ORAL | Status: AC
  Administered 2019-05-08: 1000 mg via ORAL
  Filled 2019-05-08: qty 2

## 2019-05-08 MED ORDER — KETOROLAC TROMETHAMINE 10 MG PO TABS
10.0000 mg | ORAL_TABLET | Freq: Once | ORAL | Status: DC
  Filled 2019-05-08: qty 1

## 2019-05-08 MED ORDER — LACTATED RINGERS IV BOLUS: 1000.0000 mL | Freq: Once | INTRAVENOUS | Status: DC

## 2019-05-08 MED ORDER — TRAMADOL HCL 50 MG PO TABS: 50.0000 mg | ORAL_TABLET | Freq: Two times a day (BID) | ORAL | 0 refills | Status: AC | PRN

## 2019-05-08 NOTE — ED Notes (Signed)
Pt ambulated proximately 30 ft with assistance then requested wheelchair the way to the restroom.

## 2019-05-08 NOTE — ED Notes (Signed)
Pt provided food and PO fluids. Tolerating all PO intake at this time.

## 2019-05-08 NOTE — Progress Notes (Signed)
Discharge instructions discussed with patient, verbalized agreement and understanding.  [employer] made aware of discharge as requested due to warrant for arrest.  Patient taken into custody after discharge

## 2019-05-08 NOTE — ED Triage Notes (Signed)
Pt bib [facility] from home for malaise. Pt released from [place] today after being admitted for a diabetic coma. Pt reports generalized pain, nausea, headache, and fatigue. Pt states that he has a narrow esophagus and has been in discussion with MD's about possible dilation. EMS VSS  BP 152/90 CBG 137 RR 20 97% on RA HR 74 Afebrile

## 2019-05-08 NOTE — ED Provider Notes (Signed)
[facility] EMERGENCY DEPARTMENT Provider Note   CSN: [id number] Arrival date & time: 05/08/19  1625     History Chief Complaint  Patient presents with  . Fatigue    [name] is a 64 y.o. male.  HPI 64 year old male with extensive past medical history including depression, diabetes type 1, history of polysubstance abuse, hepatitis C, history of narcotic overdose, recent admission for hypoglycemia, with discharge today, presenting to the emergency department for fatigue, diffuse generalized body aches.  Patient states he was discharged, however he does not have a ride back to his hotel where his insulin and his other medical supplies are located.  Also states that he walks with a walker at baseline and has not been able to obtain his walker since discharge.  States that he has no money and nowhere to live currently.  Denies any fevers or chills, reports diffuse body aches, malaise and fatigue since discharge earlier today.  No vomiting or abdominal pain, no nausea, no chest pain or shortness of breath.  Patient has a history of medication noncompliance as well as polysubstance abuse.  Patient was admitted to the hospital approximate 3 days ago for recurrent hypoglycemia as well as alcohol intoxication.    Past Medical History:  Diagnosis Date  . DEPRESSION   . DIABETES MELLITUS, TYPE I   . DRUG ABUSE    pt should have NO controlled substances rx'ed  . Glaucoma   . HEPATITIS C    chronic  . Heroin overdose ([place]) 10/03/2018  . Heroin use 10/03/2018  . Hypertension 02/18/2011  . 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 Vitiligo     Patient Active Problem List   Diagnosis Date Noted  . Hypoglycemia due to type 1 diabetes mellitus ([place]) 05/06/2019  . Nausea and vomiting 05/06/2019  . COPD with acute bronchitis ([place]) 05/06/2019  . Esophageal stricture   . Erosive esophagitis   . CKD (chronic kidney disease), stage III 03/31/2019  . Normocytic anemia  03/31/2019  . Otitis media 03/31/2019  . Homelessness 03/31/2019  . Adjustment disorder with mixed disturbance of emotions and conduct   . Sepsis due to pneumonia ([place]) 11/11/2018  . Hyperkalemia 11/11/2018  . Esophageal dysphagia 10/13/2018  . Acute esophagitis 10/13/2018  . Loose stools 10/13/2018  . Routine general medical examination at a health care facility 10/13/2018  . Left lower lobe pneumonia 10/07/2018  . Acute respiratory failure ([place])   . AMS (altered mental status)   . Gastroesophageal reflux disease   . Hallucinations   . Hypoglycemia associated with diabetes ([place]) 08/12/2018  . At risk for adverse drug event 02/17/2018  . Abrasions of multiple sites   . Demand ischemia of myocardium ([place]) 02/05/2018  . Acute metabolic encephalopathy 78/29/5621  . DKA (diabetic ketoacidoses) ([place]) 02/03/2018  . Leukocytosis 02/03/2018  . Hypoglycemia 12/24/2017  . Syncope 12/24/2017  . Syncope and collapse 12/23/2017  . IV drug abuse ([place]) 04/19/2017  . Acute encephalopathy 04/19/2017  . Hemangioma 10/02/2016  . Foreign body (FB) in soft tissue   . 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[place])   . Gangrene ([place])   . Type I (juvenile type) diabetes mellitus without mention of complication, not stated as uncontrolled   . Tobacco abuse   . Essential hypertension   . Diabetic infection of right foot ([place]) 07/22/2016  . Diabetic foot infection ([place]) 07/22/2016  . Chronic ulcer of heel, right, with fat layer exposed ([place]) 05/20/2016  . DKA, type 1 ([place])  04/18/2016  . AKI (acute kidney injury) ([place]) 04/18/2016  . Hyponatremia 04/18/2016  . Severe protein-calorie malnutrition ([place]) 04/18/2016  . Elevated troponin 04/18/2016  . Poor dentition 07/12/2015  . Tobacco use 07/06/2013  . PAD (peripheral artery disease) ([place]) 04/29/2013  . COPD, mild ([place])   . 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 ([place]) 11/18/2011  . Diabetic neuropathy ([place]) 06/21/2010  .  PROLIFERATIVE DIABETIC RETINOPATHY 06/21/2010  . SKIN TAG 01/30/2010  . Chronic hepatitis C ([place]) 11/22/2008  . VITILIGO 11/22/2008  . PROTEINURIA, MILD 11/22/2008  . Substance abuse ([place]) 04/23/2007  . DEPRESSION 11/11/2006    Past Surgical History:  Procedure Laterality Date  . ABDOMINAL AORTOGRAM W/LOWER EXTREMITY N/A 07/25/2016   Procedure: Abdominal Aortogram w/Bilateral Lower Extremity Runoff;  Surgeon: Conrad Yorklyn, MD;  Location: [facility];  Service: Cardiovascular;  Laterality: N/A;  . ABDOMINAL AORTOGRAM W/LOWER EXTREMITY N/A 12/24/2016   Procedure: ABDOMINAL AORTOGRAM W/LOWER EXTREMITY;  Surgeon: Serafina Mitchell, MD;  Location: [facility];  Service: Cardiovascular;  Laterality: N/A;  . AMPUTATION TOE Right 07/26/2016   Procedure: AMPUTATION GREAT TOE;  Surgeon: Serafina Mitchell, MD;  Location: [facility];  Service: Vascular;  Laterality: Right;  . BALLOON DILATION N/A 04/22/2019   Procedure: BALLOON DILATION;  Surgeon: Irene Shipper, MD;  Location: [facility] ENDOSCOPY;  Service: Endoscopy;  Laterality: N/A;  . BIOPSY  08/25/2018   Procedure: BIOPSY;  Surgeon: Irving Copas., MD;  Location: [facility];  Service: Gastroenterology;;  . BIOPSY  11/16/2018   Procedure: BIOPSY;  Surgeon: Jerene Bears, MD;  Location: [facility] ENDOSCOPY;  Service: Gastroenterology;;  . ESOPHAGOGASTRODUODENOSCOPY N/A 08/25/2018   Procedure: ESOPHAGOGASTRODUODENOSCOPY (EGD);  Surgeon: Irving Copas., MD;  Location: [facility];  Service: Gastroenterology;  Laterality: N/A;  . ESOPHAGOGASTRODUODENOSCOPY (EGD) WITH PROPOFOL N/A 11/16/2018   Procedure: ESOPHAGOGASTRODUODENOSCOPY (EGD) WITH PROPOFOL;  Surgeon: Jerene Bears, MD;  Location: [facility] ENDOSCOPY;  Service: Gastroenterology;  Laterality: N/A;  . ESOPHAGOGASTRODUODENOSCOPY (EGD) WITH PROPOFOL N/A 04/22/2019   Procedure: ESOPHAGOGASTRODUODENOSCOPY (EGD) WITH PROPOFOL;  Surgeon: Irene Shipper, MD;  Location: [facility] ENDOSCOPY;  Service: Endoscopy;   Laterality: N/A;  . EYE SURGERY     retinal surgery x 2, right eye  . INSERTION OF ILIAC STENT Right 07/26/2016   Procedure: INSERTION OF RIGHT POPLITEAL STENT WITH BALLOON ANGIOPLASTY;  Surgeon: Serafina Mitchell, MD;  Location: [facility];  Service: Vascular;  Laterality: Right;  . LOWER EXTREMITY ANGIOGRAM Right 07/26/2016   Procedure: LOWER EXTREMITY ANGIOGRAM;  Surgeon: Serafina Mitchell, MD;  Location: [facility];  Service: Vascular;  Laterality: Right;  . PERIPHERAL VASCULAR BALLOON ANGIOPLASTY Right 07/26/2016   Procedure: PERIPHERAL VASCULAR BALLOON ANGIOPLASTY  RIGHT ANTERIOR TIBEAL ARTERY AND RIGHT SUPERFICIAL FEMORAL ARTERY;  Surgeon: Serafina Mitchell, MD;  Location: [facility];  Service: Vascular;  Laterality: Right;  . PERIPHERAL VASCULAR INTERVENTION Right 12/24/2016   Procedure: PERIPHERAL VASCULAR INTERVENTION;  Surgeon: Serafina Mitchell, MD;  Location: [facility] LAB;  Service: Cardiovascular;  Laterality: Right;  lower extr       Family History  Problem Relation Age of Onset  . Diabetes Father   . Kidney disease Father   . Arthritis Mother   . Heart disease Mother        CAD  . Colon cancer Neg Hx   . Esophageal cancer Neg Hx   . Inflammatory bowel disease Neg Hx   . Liver disease Neg Hx   . Pancreatic cancer  Neg Hx   . Rectal cancer Neg Hx   . Stomach cancer Neg Hx     Social History   Tobacco Use  . Smoking status: Current Every Day Smoker    Packs/day: 1.00    Years: 51.00    Pack years: 51.00    Types: Cigarettes  . Smokeless tobacco: Former Systems developer    Types: Chew    Quit date: 1985  Substance Use Topics  . Alcohol use: No    Alcohol/week: 0.0 standard drinks    Comment: 08/14/14 none  . Drug use: Yes    Types: Marijuana, Heroin, Cocaine, "Crack" cocaine, Fentanyl, Methylphenidate    Comment: s/p rehab x 6, occasional drug use (per pt last  use was 06/2015)    Home Medications Prior to Admission medications   Medication Sig Start Date End Date Taking? Authorizing  Provider  Accu-Chek Softclix Lancets lancets Use to monitor glucose levels BID; E10.29 04/22/19   Swayze, Ava, DO  acetaminophen (TYLENOL) 325 MG tablet Take 2 tablets (650 mg total) by mouth every 6 (six) hours as needed for mild pain or headache. 04/07/19   Mercy Riding, MD  amLODipine (NORVASC) 10 MG tablet Take 1 tablet (10 mg total) by mouth daily. 04/22/19   Swayze, Ava, DO  atorvastatin (LIPITOR) 40 MG tablet Take 1 tablet (40 mg total) by mouth daily at 6 PM. 04/22/19   Swayze, Ava, DO  Blood Glucose Calibration (ACCU-CHEK AVIVA) SOLN 1 each by In Vitro route as needed. Use to calibrate meter prn 08/19/18   Renato Shin, MD  blood glucose meter kit and supplies KIT Dispense based on patient and insurance preference. Use up to four times daily as directed. (FOR ICD-9 250.00, 250.01). 08/15/18   Cristal Ford, DO  Blood Glucose Monitoring Suppl (ACCU-CHEK AVIVA PLUS) w/Device KIT 1 each by Does not apply route 2 (two) times a day. Use to monitor glucose levels BID; E10.29 08/19/18   Renato Shin, MD  ferrous sulfate 325 (65 FE) MG tablet Take 1 tablet (325 mg total) by mouth daily with breakfast. 04/22/19   Swayze, Ava, DO  gabapentin (NEURONTIN) 300 MG capsule Take 1 capsule (300 mg total) by mouth 2 (two) times daily at 10 am and 4 pm AND 3 capsules (900 mg total) at bedtime. 04/22/19   Swayze, Ava, DO  glucose blood (ACCU-CHEK AVIVA PLUS) test strip Use to monitor glucose levels BID; E10.29 04/22/19   Swayze, Ava, DO  insulin aspart (NOVOLOG) 100 UNIT/ML injection Inject 4 Units into the skin 3 (three) times daily with meals. If he eats greater than 50% of his meals 04/22/19   Swayze, Ava, DO  insulin aspart (NOVOLOG) 100 UNIT/ML injection Glucose < 70: implement hypoglycemia protocol Glucose 70 - 120: 0 units Glucose 121 - 150: 2 units Glucose 151 - 200: 3 units Glucose 201 - 250: 5 units Glucose 251 - 300: 8 units Glucose 301 - 350: 11 units Glucose 351 - 400: 15 units and call your doctor or go to  emergency department  5.  Correction coverage at bedtime (9pm) Glucose < 70: implement hypoglycemia protocol Glucose  70 - 120: 0 units Glucose 121 - 150: 0 units Glucose 151 - 200: 0 units Glucose 201 - 250: 2 units Glucose 251 - 300: 3 units Glucose 301 - 350: 4 units and call you doctor or go to emergency department 04/22/19   Swayze, Ava, DO  insulin glargine (LANTUS) 100 UNIT/ML injection Inject 0.15 mLs (15 Units total)  into the skin daily. 05/08/19   Swayze, Ava, DO  Insulin Pen Needle 31G X 5 MM MISC Use with insulin pen as directed 04/22/19   Swayze, Ava, DO  lisinopril (ZESTRIL) 2.5 MG tablet Take 1 tablet (2.5 mg total) by mouth daily. 04/23/19   Swayze, Ava, DO  nicotine (NICODERM CQ - DOSED IN MG/24 HOURS) 21 mg/24hr patch Place 1 patch (21 mg total) onto the skin daily. 04/08/19   Mercy Riding, MD  pantoprazole (PROTONIX) 40 MG tablet Take 1 tablet (40 mg total) by mouth 2 (two) times daily. 04/22/19   Swayze, Ava, DO  senna-docusate (SENOKOT-S) 8.6-50 MG tablet Take 2 tablets by mouth 2 (two) times daily. 04/22/19   Swayze, Ava, DO  traMADol (ULTRAM) 50 MG tablet Take 1 tablet (50 mg total) by mouth every 12 (twelve) hours as needed for moderate pain or severe pain. 05/08/19   Swayze, Ava, DO    Allergies    Codeine  Review of Systems   Review of Systems  Constitutional: Positive for fatigue. Negative for chills and fever.  HENT: Negative for ear pain and sore throat.   Eyes: Negative for pain and visual disturbance.  Respiratory: Negative for cough and shortness of breath.   Cardiovascular: Negative for chest pain and palpitations.  Gastrointestinal: Negative for abdominal pain and vomiting.  Genitourinary: Negative for dysuria and hematuria.  Musculoskeletal: Positive for arthralgias and myalgias. Negative for back pain.  Skin: Negative for color change and rash.  Neurological: Negative for seizures and syncope.  All other systems reviewed and are negative.   Physical  Exam Updated Vital Signs BP (!) 191/83   Pulse 79   Temp 98 F (36.7 C) (Oral)   Resp 17   SpO2 98%   Physical Exam Vitals and nursing note reviewed.  Constitutional:      General: He is not in acute distress.    Appearance: Normal appearance. He is well-developed. He is not ill-appearing, toxic-appearing or diaphoretic.     Comments: Disheveled appearing  HENT:     Head: Normocephalic and atraumatic.     Right Ear: External ear normal.     Left Ear: External ear normal.     Mouth/Throat:     Mouth: Mucous membranes are moist.     Pharynx: Oropharynx is clear.  Eyes:     Conjunctiva/sclera: Conjunctivae normal.  Cardiovascular:     Rate and Rhythm: Normal rate and regular rhythm.     Heart sounds: No murmur.  Pulmonary:     Effort: Pulmonary effort is normal. No respiratory distress.     Breath sounds: Normal breath sounds.  Abdominal:     General: Abdomen is flat.     Palpations: Abdomen is soft.     Tenderness: There is no abdominal tenderness. There is no guarding or rebound.     Hernia: No hernia is present.  Musculoskeletal:        General: No swelling or tenderness. Normal range of motion.     Cervical back: Normal range of motion and neck supple.  Skin:    General: Skin is warm and dry.     Capillary Refill: Capillary refill takes less than 2 seconds.     Coloration: Skin is not jaundiced or pale.     Findings: No bruising or erythema.  Neurological:     General: No focal deficit present.     Mental Status: He is alert. Mental status is at baseline.  Psychiatric:  Mood and Affect: Mood normal.        Behavior: Behavior normal.     ED Results / Procedures / Treatments   Labs (all labs ordered are listed, but only abnormal results are displayed) Labs Reviewed  CBC WITH DIFFERENTIAL/PLATELET - Abnormal; Notable for the following components:      Result Value   WBC 14.1 (*)    RBC 4.15 (*)    Hemoglobin 11.5 (*)    HCT 35.4 (*)    RDW 17.1 (*)     Neutro Abs 11.1 (*)    All other components within normal limits  COMPREHENSIVE METABOLIC PANEL - Abnormal; Notable for the following components:   Glucose, Bld 145 (*)    BUN 28 (*)    Creatinine, Ser 1.36 (*)    Calcium 8.8 (*)    Total Protein 5.9 (*)    Albumin 2.9 (*)    GFR calc non Af Amer 55 (*)    All other components within normal limits  CBG MONITORING, ED - Abnormal; Notable for the following components:   Glucose-Capillary 149 (*)    All other components within normal limits  MAGNESIUM    EKG None  Radiology DG Chest Portable 1 View  Result Date: 05/08/2019 CLINICAL DATA:  Fatigue, generalized pain, nausea, and headache, recently discharged after admission for diabetic coma EXAM: PORTABLE CHEST 1 VIEW COMPARISON:  Portable exam 1648 hours compared to 05/06/2019 FINDINGS: Normal heart size, mediastinal contours, and pulmonary vascularity. Linear subsegmental atelectasis LEFT lower lobe. Remaining lungs clear. No definite infiltrate, pleural effusion or pneumothorax. Bones demineralized. IMPRESSION: Subsegmental atelectasis LEFT lower lobe. Electronically Signed   By: Lavonia Dana M.D.   On: 05/08/2019 17:00   DG ESOPHAGUS W SINGLE CM (SOL OR THIN BA)  Result Date: 05/07/2019 CLINICAL DATA:  Dysphagia EXAM: ESOPHOGRAM/BARIUM SWALLOW TECHNIQUE: Single contrast examination was performed using thin barium or water soluble. FLUOROSCOPY TIME:  Fluoroscopy Time:  54 seconds Radiation Exposure Index (if provided by the fluoroscopic device): 9.1 mGy COMPARISON:  04/21/2019 FINDINGS: Patient swallowed barium without difficulty. Hypopharyngeal contours are normal. As seen on the previous study, there is persistent circumferential narrowing of the distal third of the esophagus. There is minimal holdup of contrast but ultimately no obstruction. There is no hiatal hernia. Mild spontaneous gastroesophageal reflux was present. IMPRESSION: Diffuse narrowing of the distal third of the  esophagus as seen on recent prior study. Minimal delay in passage of contrast with no obstruction. Mild spontaneous gastroesophageal reflux was present on this study. Electronically Signed   By: Macy Mis M.D.   On: 05/07/2019 16:18    Procedures Procedures (including critical care time)  Medications Ordered in ED Medications  lactated ringers bolus 1,000 mL (1,000 mLs Intravenous Not Given 05/08/19 1714)  ketorolac (TORADOL) tablet 10 mg (10 mg Oral Refused 05/08/19 1843)  acetaminophen (TYLENOL) tablet 1,000 mg (1,000 mg Oral Given 05/08/19 1714)    ED Course  I have reviewed the triage vital signs and the nursing notes.  Pertinent labs & imaging results that were available during my care of the patient were reviewed by me and considered in my medical decision making (see chart for details).    MDM Rules/Calculators/A&P                      64 year old male with extensive comorbidities as well as social issues presenting to the ED for fatigue, and generalized body aches and continued social problems,.  On arrival hemodynamically  stable, afebrile, well-appearing but disheveled.Initial EKG with no signs of acute changes, doubt ACS causing his symptoms.  Could be metabolic derangement given his history of diabetes, will check basic labs, kidney function.  Chest x-ray obtained as well, no fevers or cough, doubt any type of Covid type symptoms or infectious etiology.  Patient also has extensive social issues including access to medications as well as housing and income.  Social work was consulted for further recommendations in terms of housing and transportation as well.  CBC reassuring. Mag normal. CXR clear. Doubt PNA or infectious etiology. WBC slightly up but likely secondary to decreased PO intake.  I spoke with patient regarding his social situation, social work saw the patient and gave the patient resources.  Labs are reassuring, no metabolic derangements, likely symptoms secondary to  being in the hospital and deconditioning.  No need for admission at this time.  Patient ambulating at his baseline, tolerating p.o. well.  Return precautions are given to the patient, he agreed and understood plan.  Discharged home in good condition.  The attending physician was present and available for all medical decision making and procedures related to this patient's care.       Final Clinical Impression(s) / ED Diagnoses Final diagnoses:  Fatigue, unspecified type    Rx / DC Orders ED Discharge Orders    None       Kizzie Fantasia, MD 05/08/19 2004    Elnora Morrison, MD 05/08/19 [phone]

## 2019-05-08 NOTE — Progress Notes (Signed)
CSW received consult regarding homelessness and medications. CSW notes patient reports he left his walker, a red suit case, and a plastic bag full of his medications at the [facility] on [address]. With patient's consent, CSW called the [facility] [employer] and noted he checked out 05/06/19 and the night staff was not sure of where his belongings were. Per patient the manager said he had him stored and requested he call tomorrow morning to speak with the manager. CSW offered patient a voucher for an alternative place for him to stay and noted patient denied having anywhere and having money to get a new motel room. CSW provided RN with voucher if patient does locate a place to stay. CSW offered homelessness resources and noted patient declined as he has already contacted them and tried but they are full tonight.

## 2019-05-08 NOTE — Discharge Summary (Signed)
Physician Discharge Summary  [name] MCN:[id number] DOB: 03/17/1956 DOA: 05/05/2019  PCP: Patient, No Pcp Per  Admit date: 05/05/2019 Discharge date: 05/08/2019  Recommendations for Outpatient Follow-up:  1. Stop using heroin, cocaine, and marijuana. 2. Eat all meals. 3. Follow up with PCP in 7-10 days.  Discharge Diagnoses: Principal diagnosis is #1 1. Hypoglycemia 2. Uncontrolled DM I 3. Heroin, cocaine, THC abuse 4. Hepatitis C 5. Proliferative retinopathy 6. Narcotic seeking behavior  Discharge Condition: Fair  Disposition: Home  Diet recommendation: Carbohydrate modified diet  Filed Weights   05/06/19 0301  Weight: 68.8 kg    History of present illness:  [name] is a 64 y.o. male with medical history significant of DM1, polysubstance abuse, HCV.  Patient arrives to ED via EMS from [place].  EMS called out for AMS.  CBG 58 on arrival, given IV dextrose, CBG went to 160, mental status improved. The patient's mental status improved in ED to point where patient was cursing at nursing staff and refusing further care, so EDP sedated patient with ativan. CBG trended back down to 29, D50 again -> 144, then trended back down to 68, D50 again -> 200, now 159.  [facility] were consulted to admit the patient for further evaluation and treatment.  Hospital Course:  The patient was admitted to a telemetry bed with a D5 NS drip for 8 hours. His glucoses were normalized and he was continued on a reduced dose of Lantus with FSBS and SSI. However, his glucoses continued to be quite labile largely due to the patient's dietary choices. He was on a carbohydrate modified diet, but for lunch he had only pudding and ice cream. His glucose following that was 424 requiring another dose of lantus. He then failed to eat anything for dinner or overnight stating that he was told not to eat when his glucoses were high. He then was hypoglycemic. For the last 24 hours the patient's  glucoses have run from 80 to 265.   Throughout his stay the patient was demanding oxycodone which he was not given.   Urine drug screen was positive for cocaine, heroin, and THC. The patient denies use of these substances.  He is doing well today and is discharged to home in fair condition.  Today's assessment: S: The patient was sleeping upon my entering the room. He was easily awakened. O: Vitals:  Vitals:   05/07/19 1949 05/08/19 0507  BP: (!) 162/67 (!) 162/83  Pulse: 72 65  Resp: 17 17  Temp: 98.6 F (37 C) 98 F (36.7 C)  SpO2: 97% 96%   Constitutional:  The patient is awake, alert, and oriented x 3. No acute distress.  Respiratory:   No increased work of breathing.  No wheezes, rales, or rhonchi  No tactile fremitus Cardiovascular:   Regular rate and rhythm  No murmurs, ectopy, or gallups.  No lateral PMI. No thrills. Abdomen:   Abdomen is soft, non-tender, non-distended  No hernias, masses, or organomegaly  Hypoactive bowel sounds.  Musculoskeletal:   No cyanosis, clubbing, or edema Skin:   No rashes, lesions, ulcers  palpation of skin: no induration or nodules Neurologic:   CN 2-12 intact  Sensation all 4 extremities intact Psychiatric:   Mental status: Mood and affect are congruent. ? Mood, affect appropriate ? Orientation to person, place, time    Discharge Instructions  Discharge Instructions    Activity as tolerated - No restrictions   Complete by: As directed  Call MD for:  persistant nausea and vomiting   Complete by: As directed    Call MD for:  severe uncontrolled pain   Complete by: As directed    Diet Carb Modified   Complete by: As directed    Discharge instructions   Complete by: As directed    Stop using heroin, cocaine, and marijuana. Eat all meals. Follow up with PCP in 7-10 days.   Increase activity slowly   Complete by: As directed      Allergies as of 05/08/2019      Reactions   Codeine Itching, Hives    Tolerates hydrocodone/apap      Medication List    STOP taking these medications   oxyCODONE-acetaminophen 5-325 MG tablet Commonly known as: PERCOCET/ROXICET     TAKE these medications   Accu-Chek Aviva Plus test strip Generic drug: glucose blood Use to monitor glucose levels BID; E10.29   Accu-Chek Aviva Plus w/Device Kit 1 each by Does not apply route 2 (two) times a day. Use to monitor glucose levels BID; E10.29   Accu-Chek Aviva Soln 1 each by In Vitro route as needed. Use to calibrate meter prn   Accu-Chek Softclix Lancets lancets Use to monitor glucose levels BID; E10.29   acetaminophen 325 MG tablet Commonly known as: TYLENOL Take 2 tablets (650 mg total) by mouth every 6 (six) hours as needed for mild pain or headache.   amLODipine 10 MG tablet Commonly known as: NORVASC Take 1 tablet (10 mg total) by mouth daily.   atorvastatin 40 MG tablet Commonly known as: LIPITOR Take 1 tablet (40 mg total) by mouth daily at 6 PM.   blood glucose meter kit and supplies Kit Dispense based on patient and insurance preference. Use up to four times daily as directed. (FOR ICD-9 250.00, 250.01).   ferrous sulfate 325 (65 FE) MG tablet Take 1 tablet (325 mg total) by mouth daily with breakfast.   gabapentin 300 MG capsule Commonly known as: NEURONTIN Take 1 capsule (300 mg total) by mouth 2 (two) times daily at 10 am and 4 pm AND 3 capsules (900 mg total) at bedtime.   insulin aspart 100 UNIT/ML injection Commonly known as: novoLOG Inject 4 Units into the skin 3 (three) times daily with meals. If he eats greater than 50% of his meals   insulin aspart 100 UNIT/ML injection Commonly known as: novoLOG Glucose < 70: implement hypoglycemia protocol Glucose 70 - 120: 0 units Glucose 121 - 150: 2 units Glucose 151 - 200: 3 units Glucose 201 - 250: 5 units Glucose 251 - 300: 8 units Glucose 301 - 350: 11 units Glucose 351 - 400: 15 units and call your doctor or go to  emergency department  5.  Correction coverage at bedtime (9pm) Glucose < 70: implement hypoglycemia protocol Glucose  70 - 120: 0 units Glucose 121 - 150: 0 units Glucose 151 - 200: 0 units Glucose 201 - 250: 2 units Glucose 251 - 300: 3 units Glucose 301 - 350: 4 units and call you doctor or go to emergency department   insulin glargine 100 UNIT/ML injection Commonly known as: LANTUS Inject 0.15 mLs (15 Units total) into the skin daily. What changed:   how much to take  Another medication with the same name was removed. Continue taking this medication, and follow the directions you see here.   Insulin Pen Needle 31G X 5 MM Misc Use with insulin pen as directed   lisinopril 2.5 MG tablet  Commonly known as: ZESTRIL Take 1 tablet (2.5 mg total) by mouth daily.   nicotine 21 mg/24hr patch Commonly known as: NICODERM CQ - dosed in mg/24 hours Place 1 patch (21 mg total) onto the skin daily.   pantoprazole 40 MG tablet Commonly known as: PROTONIX Take 1 tablet (40 mg total) by mouth 2 (two) times daily.   senna-docusate 8.6-50 MG tablet Commonly known as: Senokot-S Take 2 tablets by mouth 2 (two) times daily. What changed: Another medication with the same name was removed. Continue taking this medication, and follow the directions you see here.   traMADol 50 MG tablet Commonly known as: ULTRAM Take 1 tablet (50 mg total) by mouth every 12 (twelve) hours as needed for moderate pain or severe pain.      Allergies  Allergen Reactions  . Codeine Itching and Hives    Tolerates hydrocodone/apap    The results of significant diagnostics from this hospitalization (including imaging, microbiology, ancillary and laboratory) are listed below for reference.    Significant Diagnostic Studies: DG Abd 1 View  Result Date: 05/06/2019 CLINICAL DATA:  Vomiting. EXAM: ABDOMEN - 1 VIEW COMPARISON:  Single-view of the abdomen 08/23/2018. CT abdomen and pelvis 11/11/2018. FINDINGS: The  bowel gas pattern is normal. No radio-opaque calculi or other significant radiographic abnormality are seen. IMPRESSION: Negative exam. Electronically Signed   By: Inge Rise M.D.   On: 05/06/2019 14:34   CT Head Wo Contrast  Result Date: 05/06/2019 CLINICAL DATA:  Delirium EXAM: CT HEAD WITHOUT CONTRAST TECHNIQUE: Contiguous axial images were obtained from the base of the skull through the vertex without intravenous contrast. COMPARISON:  11/11/2018 FINDINGS: Brain: There is no mass, hemorrhage or extra-axial collection. There is generalized atrophy without lobar predilection. Hypodensity of the white matter is most commonly associated with chronic microvascular disease. Vascular: Atherosclerotic calcification of the vertebral arteries at the skull base. No abnormal hyperdensity of the major intracranial arteries or dural venous sinuses. Skull: The visualized skull base, calvarium and extracranial soft tissues are normal. Sinuses/Orbits: Right mastoid effusion. The orbits are normal. IMPRESSION: 1. No acute intracranial abnormality. 2. Right mastoid effusion. Electronically Signed   By: Ulyses Jarred M.D.   On: 05/06/2019 00:46   DG Chest Portable 1 View  Result Date: 05/06/2019 CLINICAL DATA:  Cough. Altered mental status. EXAM: PORTABLE CHEST 1 VIEW COMPARISON:  Radiograph 03/30/2019, chest CT 03/31/2019 FINDINGS: Previous right upper lobe masslike opacity has resolved. Heart is normal in size. Normal mediastinal contours. Mild bronchial thickening, new. No new airspace opacity. No pleural fluid or pneumothorax. No acute osseous abnormalities. IMPRESSION: 1. New bronchial thickening since last month, suggesting bronchitis. 2. Previous right upper lobe masslike opacity has resolved, likely representing pneumonia. Electronically Signed   By: Keith Rake M.D.   On: 05/06/2019 00:49   DG ESOPHAGUS W SINGLE CM (SOL OR THIN BA)  Result Date: 05/07/2019 CLINICAL DATA:  Dysphagia EXAM:  ESOPHOGRAM/BARIUM SWALLOW TECHNIQUE: Single contrast examination was performed using thin barium or water soluble. FLUOROSCOPY TIME:  Fluoroscopy Time:  54 seconds Radiation Exposure Index (if provided by the fluoroscopic device): 9.1 mGy COMPARISON:  04/21/2019 FINDINGS: Patient swallowed barium without difficulty. Hypopharyngeal contours are normal. As seen on the previous study, there is persistent circumferential narrowing of the distal third of the esophagus. There is minimal holdup of contrast but ultimately no obstruction. There is no hiatal hernia. Mild spontaneous gastroesophageal reflux was present. IMPRESSION: Diffuse narrowing of the distal third of the esophagus as seen on recent  prior study. Minimal delay in passage of contrast with no obstruction. Mild spontaneous gastroesophageal reflux was present on this study. Electronically Signed   By: Macy Mis M.D.   On: 05/07/2019 16:18   DG ESOPHAGUS W SINGLE CM (SOL OR THIN BA)  Addendum Date: 04/21/2019   ADDENDUM REPORT: 04/21/2019 09:24 ADDENDUM: Study discussed by telephone with Dr. Karie Kirks on 04/21/2019 at 0854 hours. Electronically Signed   By: Genevie Ann M.D.   On: 04/21/2019 09:24   Result Date: 04/21/2019 CLINICAL DATA:  64 year old male with difficulty swallowing food and pills, which consistently results in vomiting. EXAM: ESOPHOGRAM/BARIUM SWALLOW TECHNIQUE: Combined double contrast and single contrast examination performed using effervescent crystals, thick barium liquid, and thin barium liquid. FLUOROSCOPY TIME:  Fluoroscopy Time:  2 minutes 18 seconds Radiation Exposure Index (if provided by the fluoroscopic device): Number of Acquired Spot Images: 0 COMPARISON:  chest CT 03/31/2019. CT Abdomen and Pelvis 11/11/2018 and earlier. FINDINGS: A double contrast study was undertaken and the patient tolerated this well and without difficulty. There is no obstruction to the forward flow of contrast through the esophagus and into the stomach.  However, the distal 3rd of the esophagus appears consistently abnormal. Shortly below the level of the carina, there is an abrupt change in the caliber and mucosal appearance of the thoracic esophagus (series 1, image 46) which continues over an approximately 6 centimeter segment approaching the gastroesophageal junction. This segment remains nondistended and demonstrates circumferential mucosal irregularity and long segment narrowing to a minimum caliber estimated at 4 millimeters. A barium tablet was not administered. By contrast to the proximal 2/3 of the thoracic esophagus has a normal caliber and mucosal pattern. The gastroesophageal junction distal to the abnormal segment also appears relatively normal. With dedicated imaging of the cervical esophagus silent aspiration of barium into the trachea and airway became apparent (series 9, image 7). The cervical esophagus itself appears normal. With prone swallows there was decreased esophageal motility, and absent motility in the abnormal distal segment (series 11, image 61). Grossly normal appearance of the stomach. No gastroesophageal reflux occurred spontaneously or was elicited. IMPRESSION: 1. Abnormal distal 3rd of the thoracic esophagus which is indeterminate for tumor versus scarring/stricture. That segment is circumferentially narrowed and irregular over a 6 cm length, with a lumen reduction down to about 4 mm diameter. Recommend Endoscopy. 2. The GE junction appears to remain normal. The cervical and more proximal thoracic esophagus appear normal. 3. This study is positive also for silent aspiration of barium into the trachea and lower airways. Electronically Signed: By: Genevie Ann M.D. On: 04/21/2019 08:49    Microbiology: Recent Results (from the past 240 hour(s))  Novel Coronavirus, NAA ([facility])     Status: None   Collection Time: 04/30/19 10:26 AM   Specimen: Nasopharyngeal(NP) swabs in vial transport medium   NASOPHARYNGE  SCREENIN  Result Value  Ref Range Status   SARS-CoV-2, NAA Not Detected Not Detected Final    Comment: This nucleic acid amplification test was developed and its performance characteristics determined by [employer], Dickinson and Company. Nucleic acid amplification tests include PCR and TMA. This test has not been FDA cleared or approved. This test has been authorized by FDA under an Emergency Use Authorization (EUA). This test is only authorized for the duration of time the declaration that circumstances exist justifying the authorization of the emergency use of in vitro diagnostic tests for detection of SARS-CoV-2 virus and/or diagnosis of COVID-19 infection under section 564(b)(1) of the Act, 21 U.S.C.  360bbb-3(b) (1), unless the authorization is terminated or revoked sooner. When diagnostic testing is negative, the possibility of a false negative result should be considered in the context of a patient's recent exposures and the presence of clinical signs and symptoms consistent with COVID-19. An individual without symptoms of COVID-19 and who is not shedding SARS-CoV-2 virus would  expect to have a negative (not detected) result in this assay.   Respiratory Panel by RT PCR (Flu A&B, Covid) - Nasopharyngeal Swab     Status: None   Collection Time: 05/06/19  1:55 AM   Specimen: Nasopharyngeal Swab  Result Value Ref Range Status   SARS Coronavirus 2 by RT PCR NEGATIVE NEGATIVE Final    Comment: (NOTE) SARS-CoV-2 target nucleic acids are NOT DETECTED. The SARS-CoV-2 RNA is generally detectable in upper respiratoy specimens during the acute phase of infection. The lowest concentration of SARS-CoV-2 viral copies this assay can detect is 131 copies/mL. A negative result does not preclude SARS-Cov-2 infection and should not be used as the sole basis for treatment or other patient management decisions. A negative result may occur with  improper specimen collection/handling, submission of specimen other than nasopharyngeal  swab, presence of viral mutation(s) within the areas targeted by this assay, and inadequate number of viral copies (<131 copies/mL). A negative result must be combined with clinical observations, patient history, and epidemiological information. The expected result is Negative. Fact Sheet for Patients:  PinkCheek.be Fact Sheet for Healthcare Providers:  GravelBags.it This test is not yet ap proved or cleared by the [place] FDA and  has been authorized for detection and/or diagnosis of SARS-CoV-2 by FDA under an Emergency Use Authorization (EUA). This EUA will remain  in effect (meaning this test can be used) for the duration of the COVID-19 declaration under Section 564(b)(1) of the Act, 21 U.S.C. section 360bbb-3(b)(1), unless the authorization is terminated or revoked sooner.    Influenza A by PCR NEGATIVE NEGATIVE Final   Influenza B by PCR NEGATIVE NEGATIVE Final    Comment: (NOTE) The Xpert Xpress SARS-CoV-2/FLU/RSV assay is intended as an aid in  the diagnosis of influenza from Nasopharyngeal swab specimens and  should not be used as a sole basis for treatment. Nasal washings and  aspirates are unacceptable for Xpert Xpress SARS-CoV-2/FLU/RSV  testing. Fact Sheet for Patients: PinkCheek.be Fact Sheet for Healthcare Providers: GravelBags.it This test is not yet approved or cleared by the [place] FDA and  has been authorized for detection and/or diagnosis of SARS-CoV-2 by  FDA under an Emergency Use Authorization (EUA). This EUA will remain  in effect (meaning this test can be used) for the duration of the  Covid-19 declaration under Section 564(b)(1) of the Act, 21  U.S.C. section 360bbb-3(b)(1), unless the authorization is  terminated or revoked. Performed at [facility], [place] [address] [place], [place] [address]       Labs: Basic Metabolic Panel: Recent Labs  Lab 05/05/19 2025 05/06/19 0356 05/07/19 0410  NA 139 135 139  K 3.7 3.8 3.7  CL 103 102 107  CO2 '26 23 24  '$ GLUCOSE 143* 174* 24*  BUN 16 16 29*  CREATININE 1.23 1.19 1.55*  CALCIUM 9.6 8.8* 9.0   Liver Function Tests: Recent Labs  Lab 05/05/19 2025  AST 24  ALT 23  ALKPHOS 94  BILITOT 0.3  PROT 7.3  ALBUMIN 3.4*   No results for input(s): LIPASE, AMYLASE in the last 168 hours. Recent Labs  Lab 05/05/19 2025  AMMONIA 26   CBC: Recent Labs  Lab 05/05/19 2025 05/07/19 0410  WBC 17.7* 10.5  NEUTROABS 16.0*  --   HGB 9.9* 10.9*  HCT 35.7* 34.1*  MCV 99.2 85.3  PLT 313 386   Cardiac Enzymes: No results for input(s): CKTOTAL, CKMB, CKMBINDEX, TROPONINI in the last 168 hours. BNP: BNP (last 3 results) No results for input(s): BNP in the last 8760 hours.  ProBNP (last 3 results) No results for input(s): PROBNP in the last 8760 hours.  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  Signed:        Sergio Hobart, DO [name]  05/08/2019, 1:01 PM

## 2019-05-09 LAB — CBG MONITORING, ED: Glucose-Capillary: 209 mg/dL — ABNORMAL HIGH (ref 70–99)

## 2019-05-09 NOTE — ED Notes (Signed)
Pt escorted to lobby via w/c and walker taken w/pt. [facility] called to transport pt to [facility] and voucher given as requested.

## 2019-05-09 NOTE — ED Notes (Addendum)
CBG taken per pts request. Pt took a sip of milk while this staff member was in the room and then pt sts "I cant eat this food, I need my esophagus stretched. i'm choking right now.''  Pt speech is clear. Pt not vomitting, emesis bag given.

## 2019-05-09 NOTE — ED Notes (Signed)
Breakfast ordered 

## 2019-05-09 NOTE — ED Notes (Signed)
Breakfast tray delivered

## 2019-05-09 NOTE — ED Notes (Signed)
Pt up to restroom with walker. Pt back to bed and resting comfortably at this time.

## 2019-05-09 NOTE — ED Notes (Addendum)
Pt called [facility] - pt advised they told him his belongings are there but he will need to pay $50 storage fee. Pt noted to be upset and hung up phone. Pt requesting for staff to locate his brother's phone number - [place], [place]. Advised pt today is Sunday and provided pt w/phone number from [place]. Pt stated "Just throw me out!" and returned to his room and got dressed - verbalized he has all of his belongings. Pt calling "Tim" from phone at nurses' desk. Pt given walker as per Agricultural consultant. Offered pt taxi voucher but pt unable to provide address. Offered pt bus pass - declined. Pt requested for his shirt to be thrown away d/t he did not want it. Declined VS's and declined to sign for d/c paperwork - given to pt after discussing w/pt.

## 2019-05-09 NOTE — ED Notes (Signed)
Pt c/o nausea - attempted to remove pt's breakfast tray - pt refused - pt then ate breakfast and spit into breakfast tray. Tray removed.

## 2019-05-10 ENCOUNTER — Encounter (HOSPITAL_COMMUNITY): Payer: Self-pay | Admitting: Emergency Medicine

## 2019-05-10 ENCOUNTER — Emergency Department (HOSPITAL_COMMUNITY): Payer: Medicare HMO

## 2019-05-10 ENCOUNTER — Emergency Department (HOSPITAL_COMMUNITY)
Admission: EM | Admit: 2019-05-10 | Discharge: 2019-05-17 | Disposition: E | Payer: Medicare HMO | Source: Home / Self Care | Attending: Emergency Medicine | Admitting: Emergency Medicine

## 2019-05-10 ENCOUNTER — Emergency Department (HOSPITAL_COMMUNITY)
Admission: EM | Admit: 2019-05-10 | Discharge: 2019-05-17 | Disposition: E | Payer: Medicare HMO | Attending: Emergency Medicine | Admitting: Emergency Medicine

## 2019-05-10 DIAGNOSIS — Z20822 Contact with and (suspected) exposure to covid-19: Secondary | ICD-10-CM | POA: Insufficient documentation

## 2019-05-10 DIAGNOSIS — F17210 Nicotine dependence, cigarettes, uncomplicated: Secondary | ICD-10-CM | POA: Insufficient documentation

## 2019-05-10 DIAGNOSIS — I10 Essential (primary) hypertension: Secondary | ICD-10-CM | POA: Diagnosis not present

## 2019-05-10 DIAGNOSIS — S3981XA Other specified injuries of abdomen, initial encounter: Secondary | ICD-10-CM | POA: Insufficient documentation

## 2019-05-10 DIAGNOSIS — Z590 Homelessness: Secondary | ICD-10-CM | POA: Insufficient documentation

## 2019-05-10 DIAGNOSIS — Z79899 Other long term (current) drug therapy: Secondary | ICD-10-CM | POA: Diagnosis not present

## 2019-05-10 DIAGNOSIS — I468 Cardiac arrest due to other underlying condition: Secondary | ICD-10-CM | POA: Insufficient documentation

## 2019-05-10 DIAGNOSIS — E1165 Type 2 diabetes mellitus with hyperglycemia: Secondary | ICD-10-CM | POA: Diagnosis not present

## 2019-05-10 DIAGNOSIS — E1065 Type 1 diabetes mellitus with hyperglycemia: Secondary | ICD-10-CM | POA: Diagnosis not present

## 2019-05-10 DIAGNOSIS — N183 Chronic kidney disease, stage 3 unspecified: Secondary | ICD-10-CM | POA: Diagnosis not present

## 2019-05-10 DIAGNOSIS — S298XXA Other specified injuries of thorax, initial encounter: Secondary | ICD-10-CM | POA: Insufficient documentation

## 2019-05-10 DIAGNOSIS — I129 Hypertensive chronic kidney disease with stage 1 through stage 4 chronic kidney disease, or unspecified chronic kidney disease: Secondary | ICD-10-CM | POA: Diagnosis not present

## 2019-05-10 DIAGNOSIS — I499 Cardiac arrhythmia, unspecified: Secondary | ICD-10-CM | POA: Diagnosis not present

## 2019-05-10 DIAGNOSIS — S3983XA Other specified injuries of pelvis, initial encounter: Secondary | ICD-10-CM | POA: Insufficient documentation

## 2019-05-10 DIAGNOSIS — Y92410 Unspecified street and highway as the place of occurrence of the external cause: Secondary | ICD-10-CM | POA: Insufficient documentation

## 2019-05-10 DIAGNOSIS — R42 Dizziness and giddiness: Secondary | ICD-10-CM | POA: Diagnosis not present

## 2019-05-10 DIAGNOSIS — R404 Transient alteration of awareness: Secondary | ICD-10-CM | POA: Diagnosis not present

## 2019-05-10 DIAGNOSIS — J449 Chronic obstructive pulmonary disease, unspecified: Secondary | ICD-10-CM | POA: Insufficient documentation

## 2019-05-10 DIAGNOSIS — R000 Tachycardia, unspecified: Secondary | ICD-10-CM | POA: Diagnosis not present

## 2019-05-10 DIAGNOSIS — Y9389 Activity, other specified: Secondary | ICD-10-CM | POA: Insufficient documentation

## 2019-05-10 DIAGNOSIS — R109 Unspecified abdominal pain: Secondary | ICD-10-CM | POA: Insufficient documentation

## 2019-05-10 DIAGNOSIS — S3991XA Unspecified injury of abdomen, initial encounter: Secondary | ICD-10-CM

## 2019-05-10 DIAGNOSIS — Y999 Unspecified external cause status: Secondary | ICD-10-CM | POA: Insufficient documentation

## 2019-05-10 DIAGNOSIS — R739 Hyperglycemia, unspecified: Secondary | ICD-10-CM

## 2019-05-10 DIAGNOSIS — R531 Weakness: Secondary | ICD-10-CM | POA: Diagnosis not present

## 2019-05-10 DIAGNOSIS — R4020 Unspecified coma: Secondary | ICD-10-CM | POA: Diagnosis not present

## 2019-05-10 DIAGNOSIS — E109 Type 1 diabetes mellitus without complications: Secondary | ICD-10-CM | POA: Insufficient documentation

## 2019-05-10 DIAGNOSIS — I469 Cardiac arrest, cause unspecified: Secondary | ICD-10-CM | POA: Diagnosis not present

## 2019-05-10 HISTORY — DX: Type 2 diabetes mellitus without complications: E11.9

## 2019-05-10 HISTORY — DX: Depression, unspecified: F32.A

## 2019-05-10 HISTORY — DX: Unspecified viral hepatitis C without hepatic coma: B19.20

## 2019-05-10 LAB — COMPREHENSIVE METABOLIC PANEL
ALT: 21 U/L (ref 0–44)
AST: 17 U/L (ref 15–41)
Albumin: 3.3 g/dL — ABNORMAL LOW (ref 3.5–5.0)
Alkaline Phosphatase: 85 U/L (ref 38–126)
Anion gap: 12 (ref 5–15)
BUN: 22 mg/dL (ref 8–23)
CO2: 22 mmol/L (ref 22–32)
Calcium: 9.4 mg/dL (ref 8.9–10.3)
Chloride: 106 mmol/L (ref 98–111)
Creatinine, Ser: 1.18 mg/dL (ref 0.61–1.24)
GFR calc Af Amer: >60 mL/min (ref >60)
GFR calc non Af Amer: >60 mL/min (ref >60)
Glucose, Bld: 377 mg/dL — ABNORMAL HIGH (ref 70–99)
Potassium: 4.3 mmol/L (ref 3.5–5.1)
Sodium: 140 mmol/L (ref 135–145)
Total Bilirubin: 0.5 mg/dL (ref 0.3–1.2)
Total Protein: 7.1 g/dL (ref 6.5–8.1)

## 2019-05-10 LAB — CBG MONITORING, ED
Glucose-Capillary: 351 mg/dL — ABNORMAL HIGH (ref 70–99)
Glucose-Capillary: 239 mg/dL — ABNORMAL HIGH (ref 70–99)

## 2019-05-10 LAB — CBC WITH DIFFERENTIAL/PLATELET
Abs Immature Granulocytes: 0.03 10*3/uL (ref 0.00–0.07)
Basophils Absolute: 0.2 10*3/uL — ABNORMAL HIGH (ref 0.0–0.1)
Basophils Relative: 2 %
Eosinophils Absolute: 0.3 10*3/uL (ref 0.0–0.5)
Eosinophils Relative: 3 %
HCT: 37.7 % — ABNORMAL LOW (ref 39.0–52.0)
Hemoglobin: 12.1 g/dL — ABNORMAL LOW (ref 13.0–17.0)
Immature Granulocytes: 0 %
Lymphocytes Relative: 17 %
Lymphs Abs: 1.5 10*3/uL (ref 0.7–4.0)
MCH: 27.5 pg (ref 26.0–34.0)
MCHC: 32.1 g/dL (ref 30.0–36.0)
MCV: 85.7 fL (ref 80.0–100.0)
Monocytes Absolute: 0.5 10*3/uL (ref 0.1–1.0)
Monocytes Relative: 6 %
Neutro Abs: 6.1 10*3/uL (ref 1.7–7.7)
Neutrophils Relative %: 72 %
Platelets: 391 10*3/uL (ref 150–400)
RBC: 4.4 MIL/uL (ref 4.22–5.81)
RDW: 17 % — ABNORMAL HIGH (ref 11.5–15.5)
WBC: 8.6 10*3/uL (ref 4.0–10.5)
nRBC: 0 % (ref 0.0–0.2)

## 2019-05-10 LAB — URINALYSIS, ROUTINE W REFLEX MICROSCOPIC
Bacteria, UA: NONE SEEN
Bilirubin Urine: NEGATIVE
Glucose, UA: >=500 mg/dL — AB
Hgb urine dipstick: NEGATIVE
Ketones, ur: 5 mg/dL — AB
Leukocytes,Ua: NEGATIVE
Nitrite: NEGATIVE
Protein, ur: >=300 mg/dL — AB
Specific Gravity, Urine: 1.017 (ref 1.005–1.030)
pH: 6 (ref 5.0–8.0)

## 2019-05-10 LAB — RAPID URINE DRUG SCREEN, HOSP PERFORMED
Amphetamines: NOT DETECTED
Barbiturates: NOT DETECTED
Benzodiazepines: NOT DETECTED
Cocaine: NOT DETECTED
Opiates: NOT DETECTED
Tetrahydrocannabinol: POSITIVE — AB

## 2019-05-10 LAB — LACTIC ACID, PLASMA: Lactic Acid, Venous: 1.6 mmol/L (ref 0.5–1.9)

## 2019-05-10 LAB — POC SARS CORONAVIRUS 2 AG -  ED: SARS Coronavirus 2 Ag: NEGATIVE

## 2019-05-10 LAB — TROPONIN I (HIGH SENSITIVITY)
Troponin I (High Sensitivity): 9 ng/L (ref <18)
Troponin I (High Sensitivity): 5 ng/L (ref <18)

## 2019-05-10 LAB — GLUCOSE, CAPILLARY: Glucose-Capillary: 20 mg/dL — CL (ref 70–99)

## 2019-05-10 LAB — LIPASE, BLOOD: Lipase: 23 U/L (ref 11–51)

## 2019-05-10 MED ORDER — INSULIN ASPART 100 UNIT/ML ~~LOC~~ SOLN: 4.0000 [IU] | Freq: Three times a day (TID) | SUBCUTANEOUS | 0 refills | Status: AC

## 2019-05-10 MED ORDER — SODIUM CHLORIDE 0.9 % IV BOLUS
1000.0000 mL | Freq: Once | INTRAVENOUS | Status: AC
  Administered 2019-05-10: 1000 mL via INTRAVENOUS

## 2019-05-10 MED ORDER — EPINEPHRINE 1 MG/10ML IJ SOSY
PREFILLED_SYRINGE | INTRAMUSCULAR | Status: AC | PRN
  Administered 2019-05-10: 1 mg via INTRAVENOUS

## 2019-05-10 MED ORDER — SODIUM BICARBONATE 8.4 % IV SOLN
INTRAVENOUS | Status: AC | PRN
  Administered 2019-05-10: 50 meq via INTRAVENOUS

## 2019-05-10 MED ORDER — INSULIN GLARGINE 100 UNIT/ML ~~LOC~~ SOLN: 15.0000 [IU] | Freq: Every day | SUBCUTANEOUS | 0 refills | Status: AC

## 2019-05-10 MED ORDER — INSULIN ASPART 100 UNIT/ML ~~LOC~~ SOLN
5.0000 [IU] | Freq: Once | SUBCUTANEOUS | Status: AC
  Administered 2019-05-10: 5 [IU] via SUBCUTANEOUS
  Filled 2019-05-10: qty 0.05

## 2019-05-10 MED ORDER — AMLODIPINE BESYLATE 10 MG PO TABS: 10.0000 mg | ORAL_TABLET | Freq: Every day | ORAL | 0 refills | Status: AC

## 2019-05-10 MED ORDER — IOHEXOL 300 MG/ML  SOLN
100.0000 mL | Freq: Once | INTRAMUSCULAR | Status: AC | PRN
  Administered 2019-05-10: 100 mL via INTRAVENOUS

## 2019-05-10 MED ORDER — SODIUM CHLORIDE (PF) 0.9 % IJ SOLN
INTRAMUSCULAR | Status: AC
  Filled 2019-05-10: qty 50

## 2019-05-10 MED ORDER — CALCIUM CHLORIDE 10 % IV SOLN
INTRAVENOUS | Status: AC | PRN
  Administered 2019-05-10: 1 g via INTRAVENOUS

## 2019-05-10 MED ORDER — LISINOPRIL 2.5 MG PO TABS: 2.5000 mg | ORAL_TABLET | Freq: Every day | ORAL | 0 refills | Status: AC

## 2019-05-11 ENCOUNTER — Encounter (HOSPITAL_COMMUNITY): Payer: Self-pay | Admitting: Emergency Medicine

## 2019-05-15 LAB — CULTURE, BLOOD (ROUTINE X 2)
Culture: NO GROWTH
Culture: NO GROWTH
Special Requests: ADEQUATE
Special Requests: ADEQUATE

## 2019-05-17 NOTE — Progress Notes (Signed)
CSW called pt's RN via the ED secretary  And per RN, pt states the # for his son does not work and that his son is in [place] now.  RN updated that the [facility] RN CM will provide the pt with a handout prior to D/C.  Per ED Sec RN voiced understanding.  CSW will continue to follow for D/C needs.  Alphonse Guild. Jaziah Goeller, LCSW, LCAS, CSI Transitions of Care Clinical Social Worker Care Coordination Department Ph: [phone]

## 2019-05-17 NOTE — ED Notes (Addendum)
Fast exam by Dr. Maryan Rued, no cardiac movement seen

## 2019-05-17 NOTE — Discharge Instructions (Signed)
Your prescriptions were sent to the pharmacy again.  Take your medications as prescribed and watch your blood sugars closely.  Follow-up with your doctor.  Return to the ED if you develop new or worsening symptoms.

## 2019-05-17 NOTE — ED Triage Notes (Signed)
Patient here from home with complaints of hyperglycemia. States that he has not had insulin in 3 days, because the hotel took it because he wouldn't pay.

## 2019-05-17 NOTE — ED Notes (Signed)
Patient given sandwich, soup, crackers, milk, and water
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
## 2019-05-17 NOTE — Progress Notes (Signed)
Orthopedic Tech Progress Note Patient Details:  [name] [birth year] [id number] LEVEL 1 TRAUMA Patient ID: [name], male   DOB: October 11, 1955, 64 y.o.   MRN: [id number]   [name] 05-17-19, 6:52 PM
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
## 2019-05-17 NOTE — ED Notes (Signed)
Bladder scan 463ml, patient currently urinating

## 2019-05-17 NOTE — ED Provider Notes (Signed)
[facility] DEPT Provider Note   CSN: [id number] Arrival date & time: 2019-05-14  0857     History Chief Complaint  Patient presents with  . Hyperglycemia    [name] is a 64 y.o. male.  Patient with history of diabetes, polysubstance abuse, heroin abuse, hepatitis presenting with hyperglycemia and no place to stay.  States he has not had his insulin for 2 or 3 days.  He has been staying at a hotel but the hotel apparently throughout his insulin because he was unable to pay.  Patient states he has diffuse abdominal pain with nausea and vomiting since this morning not able to keep anything down.  He is concerned he may need his esophagus stretched again.  He denies any diarrhea, chest pain or shortness of breath.  No pain with urination or blood in the urine.  He was seen at [facility] yesterday and discharged with a taxi voucher after seeing social work.  Patient did have a recent admission for hypoglycemia and dehydration.  He denies any fevers.  He states his last heroin use was approximately 2 or 3 days ago admits to relapse.  He feels like he needs to urinate currently.  He has no chest pain or shortness of breath.  The history is provided by the patient and the EMS personnel.  Hyperglycemia Associated symptoms: abdominal pain, fatigue, nausea, vomiting and weakness   Associated symptoms: no dizziness, no dysuria, no fever and no shortness of breath        Past Medical History:  Diagnosis Date  . DEPRESSION   . DIABETES MELLITUS, TYPE I   . DRUG ABUSE    pt should have NO controlled substances rx'ed  . Glaucoma   . HEPATITIS C    chronic  . Heroin overdose ([place]) 10/03/2018  . Heroin use 10/03/2018  . Hypertension 02/18/2011  . 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 Vitiligo     Patient Active Problem List   Diagnosis Date Noted  . Hypoglycemia due to type 1 diabetes mellitus ([place]) 05/06/2019  . Nausea and vomiting 05/06/2019  .  COPD with acute bronchitis ([place]) 05/06/2019  . Esophageal stricture   . Erosive esophagitis   . CKD (chronic kidney disease), stage III 03/31/2019  . Normocytic anemia 03/31/2019  . Otitis media 03/31/2019  . Homelessness 03/31/2019  . Adjustment disorder with mixed disturbance of emotions and conduct   . Sepsis due to pneumonia ([place]) 11/11/2018  . Hyperkalemia 11/11/2018  . Esophageal dysphagia 10/13/2018  . Acute esophagitis 10/13/2018  . Loose stools 10/13/2018  . Routine general medical examination at a health care facility 10/13/2018  . Left lower lobe pneumonia 10/07/2018  . Acute respiratory failure ([place])   . AMS (altered mental status)   . Gastroesophageal reflux disease   . Hallucinations   . Hypoglycemia associated with diabetes ([place]) 08/12/2018  . At risk for adverse drug event 02/17/2018  . Abrasions of multiple sites   . Demand ischemia of myocardium ([place]) 02/05/2018  . Acute metabolic encephalopathy 50/27/7412  . DKA (diabetic ketoacidoses) ([place]) 02/03/2018  . Leukocytosis 02/03/2018  . Hypoglycemia 12/24/2017  . Syncope 12/24/2017  . Syncope and collapse 12/23/2017  . IV drug abuse ([place]) 04/19/2017  . Acute encephalopathy 04/19/2017  . Hemangioma 10/02/2016  . Foreign body (FB) in soft tissue   . 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[place])   . Gangrene ([place])   . Type I (juvenile type) diabetes mellitus without mention of complication,  not stated as uncontrolled   . Tobacco abuse   . Essential hypertension   . Diabetic infection of right foot ([place]) 07/22/2016  . Diabetic foot infection ([place]) 07/22/2016  . Chronic ulcer of heel, right, with fat layer exposed ([place]) 05/20/2016  . DKA, type 1 ([place]) 04/18/2016  . AKI (acute kidney injury) ([place]) 04/18/2016  . Hyponatremia 04/18/2016  . Severe protein-calorie malnutrition ([place]) 04/18/2016  . Elevated troponin 04/18/2016  . Poor dentition 07/12/2015  . Tobacco use 07/06/2013  . PAD (peripheral artery disease) ([place])  04/29/2013  . COPD, mild ([place])   . 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 ([place]) 11/18/2011  . Diabetic neuropathy ([place]) 06/21/2010  . PROLIFERATIVE DIABETIC RETINOPATHY 06/21/2010  . SKIN TAG 01/30/2010  . Chronic hepatitis C ([place]) 11/22/2008  . VITILIGO 11/22/2008  . PROTEINURIA, MILD 11/22/2008  . Substance abuse ([place]) 04/23/2007  . DEPRESSION 11/11/2006    Past Surgical History:  Procedure Laterality Date  . ABDOMINAL AORTOGRAM W/LOWER EXTREMITY N/A 07/25/2016   Procedure: Abdominal Aortogram w/Bilateral Lower Extremity Runoff;  Surgeon: Conrad Olney, MD;  Location: [facility];  Service: Cardiovascular;  Laterality: N/A;  . ABDOMINAL AORTOGRAM W/LOWER EXTREMITY N/A 12/24/2016   Procedure: ABDOMINAL AORTOGRAM W/LOWER EXTREMITY;  Surgeon: Serafina Mitchell, MD;  Location: [place] [facility];  Service: Cardiovascular;  Laterality: N/A;  . AMPUTATION TOE Right 07/26/2016   Procedure: AMPUTATION GREAT TOE;  Surgeon: Serafina Mitchell, MD;  Location: [facility];  Service: Vascular;  Laterality: Right;  . BALLOON DILATION N/A 04/22/2019   Procedure: BALLOON DILATION;  Surgeon: Irene Shipper, MD;  Location: [facility] ENDOSCOPY;  Service: Endoscopy;  Laterality: N/A;  . BIOPSY  08/25/2018   Procedure: BIOPSY;  Surgeon: Irving Copas., MD;  Location: [place];  Service: Gastroenterology;;  . BIOPSY  11/16/2018   Procedure: BIOPSY;  Surgeon: Jerene Bears, MD;  Location: [facility] ENDOSCOPY;  Service: Gastroenterology;;  . ESOPHAGOGASTRODUODENOSCOPY N/A 08/25/2018   Procedure: ESOPHAGOGASTRODUODENOSCOPY (EGD);  Surgeon: Irving Copas., MD;  Location: [facility];  Service: Gastroenterology;  Laterality: N/A;  . ESOPHAGOGASTRODUODENOSCOPY (EGD) WITH PROPOFOL N/A 11/16/2018   Procedure: ESOPHAGOGASTRODUODENOSCOPY (EGD) WITH PROPOFOL;  Surgeon: Jerene Bears, MD;  Location: [facility] ENDOSCOPY;  Service: Gastroenterology;  Laterality: N/A;  . ESOPHAGOGASTRODUODENOSCOPY (EGD) WITH  PROPOFOL N/A 04/22/2019   Procedure: ESOPHAGOGASTRODUODENOSCOPY (EGD) WITH PROPOFOL;  Surgeon: Irene Shipper, MD;  Location: [facility] ENDOSCOPY;  Service: Endoscopy;  Laterality: N/A;  . EYE SURGERY     retinal surgery x 2, right eye  . INSERTION OF ILIAC STENT Right 07/26/2016   Procedure: INSERTION OF RIGHT POPLITEAL STENT WITH BALLOON ANGIOPLASTY;  Surgeon: Serafina Mitchell, MD;  Location: [facility];  Service: Vascular;  Laterality: Right;  . LOWER EXTREMITY ANGIOGRAM Right 07/26/2016   Procedure: LOWER EXTREMITY ANGIOGRAM;  Surgeon: Serafina Mitchell, MD;  Location: [facility];  Service: Vascular;  Laterality: Right;  . PERIPHERAL VASCULAR BALLOON ANGIOPLASTY Right 07/26/2016   Procedure: PERIPHERAL VASCULAR BALLOON ANGIOPLASTY  RIGHT ANTERIOR TIBEAL ARTERY AND RIGHT SUPERFICIAL FEMORAL ARTERY;  Surgeon: Serafina Mitchell, MD;  Location: [facility];  Service: Vascular;  Laterality: Right;  . PERIPHERAL VASCULAR INTERVENTION Right 12/24/2016   Procedure: PERIPHERAL VASCULAR INTERVENTION;  Surgeon: Serafina Mitchell, MD;  Location: [facility];  Service: Cardiovascular;  Laterality: Right;  lower extr       Family History  Problem Relation Age of Onset  . Diabetes Father   . Kidney disease Father   .  Arthritis Mother   . Heart disease Mother        CAD  . Colon cancer Neg Hx   . Esophageal cancer Neg Hx   . Inflammatory bowel disease Neg Hx   . Liver disease Neg Hx   . Pancreatic cancer Neg Hx   . Rectal cancer Neg Hx   . Stomach cancer Neg Hx     Social History   Tobacco Use  . Smoking status: Current Every Day Smoker    Packs/day: 1.00    Years: 51.00    Pack years: 51.00    Types: Cigarettes  . Smokeless tobacco: Former Systems developer    Types: Chew    Quit date: 1985  Substance Use Topics  . Alcohol use: No    Alcohol/week: 0.0 standard drinks    Comment: 08/14/14 none  . Drug use: Yes    Types: Marijuana, Heroin, Cocaine, "Crack" cocaine, Fentanyl, Methylphenidate    Comment: s/p rehab x 6,  occasional drug use (per pt last  use was 06/2015)    Home Medications Prior to Admission medications   Medication Sig Start Date End Date Taking? Authorizing Provider  Accu-Chek Softclix Lancets lancets Use to monitor glucose levels BID; E10.29 04/22/19   Swayze, Ava, DO  acetaminophen (TYLENOL) 325 MG tablet Take 2 tablets (650 mg total) by mouth every 6 (six) hours as needed for mild pain or headache. 04/07/19   Mercy Riding, MD  amLODipine (NORVASC) 10 MG tablet Take 1 tablet (10 mg total) by mouth daily. 04/22/19   Swayze, Ava, DO  atorvastatin (LIPITOR) 40 MG tablet Take 1 tablet (40 mg total) by mouth daily at 6 PM. 04/22/19   Swayze, Ava, DO  Blood Glucose Calibration (ACCU-CHEK AVIVA) SOLN 1 each by In Vitro route as needed. Use to calibrate meter prn 08/19/18   Renato Shin, MD  blood glucose meter kit and supplies KIT Dispense based on patient and insurance preference. Use up to four times daily as directed. (FOR ICD-9 250.00, 250.01). 08/15/18   Cristal Ford, DO  Blood Glucose Monitoring Suppl (ACCU-CHEK AVIVA PLUS) w/Device KIT 1 each by Does not apply route 2 (two) times a day. Use to monitor glucose levels BID; E10.29 08/19/18   Renato Shin, MD  ferrous sulfate 325 (65 FE) MG tablet Take 1 tablet (325 mg total) by mouth daily with breakfast. 04/22/19   Swayze, Ava, DO  gabapentin (NEURONTIN) 300 MG capsule Take 1 capsule (300 mg total) by mouth 2 (two) times daily at 10 am and 4 pm AND 3 capsules (900 mg total) at bedtime. 04/22/19   Swayze, Ava, DO  glucose blood (ACCU-CHEK AVIVA PLUS) test strip Use to monitor glucose levels BID; E10.29 04/22/19   Swayze, Ava, DO  insulin aspart (NOVOLOG) 100 UNIT/ML injection Inject 4 Units into the skin 3 (three) times daily with meals. If he eats greater than 50% of his meals 04/22/19   Swayze, Ava, DO  insulin aspart (NOVOLOG) 100 UNIT/ML injection Glucose < 70: implement hypoglycemia protocol Glucose 70 - 120: 0 units Glucose 121 - 150: 2 units Glucose  151 - 200: 3 units Glucose 201 - 250: 5 units Glucose 251 - 300: 8 units Glucose 301 - 350: 11 units Glucose 351 - 400: 15 units and call your doctor or go to emergency department  5.  Correction coverage at bedtime (9pm) Glucose < 70: implement hypoglycemia protocol Glucose  70 - 120: 0 units Glucose 121 - 150: 0 units Glucose 151 -  200: 0 units Glucose 201 - 250: 2 units Glucose 251 - 300: 3 units Glucose 301 - 350: 4 units and call you doctor or go to emergency department 04/22/19   Swayze, Ava, DO  insulin glargine (LANTUS) 100 UNIT/ML injection Inject 0.15 mLs (15 Units total) into the skin daily. 05/08/19   Swayze, Ava, DO  Insulin Pen Needle 31G X 5 MM MISC Use with insulin pen as directed 04/22/19   Swayze, Ava, DO  lisinopril (ZESTRIL) 2.5 MG tablet Take 1 tablet (2.5 mg total) by mouth daily. 04/23/19   Swayze, Ava, DO  nicotine (NICODERM CQ - DOSED IN MG/24 HOURS) 21 mg/24hr patch Place 1 patch (21 mg total) onto the skin daily. 04/08/19   Mercy Riding, MD  pantoprazole (PROTONIX) 40 MG tablet Take 1 tablet (40 mg total) by mouth 2 (two) times daily. 04/22/19   Swayze, Ava, DO  senna-docusate (SENOKOT-S) 8.6-50 MG tablet Take 2 tablets by mouth 2 (two) times daily. 04/22/19   Swayze, Ava, DO  traMADol (ULTRAM) 50 MG tablet Take 1 tablet (50 mg total) by mouth every 12 (twelve) hours as needed for moderate pain or severe pain. 05/08/19   Swayze, Ava, DO    Allergies    Codeine  Review of Systems   Review of Systems  Constitutional: Positive for activity change, appetite change and fatigue. Negative for fever.  HENT: Negative for congestion.   Eyes: Negative for visual disturbance.  Respiratory: Negative for cough, chest tightness and shortness of breath.   Gastrointestinal: Positive for abdominal pain, nausea and vomiting.  Genitourinary: Negative for dysuria and hematuria.  Musculoskeletal: Positive for arthralgias and myalgias.  Neurological: Positive for weakness. Negative for  dizziness and headaches.   all other systems are negative except as noted in the HPI and PMH.    Physical Exam Updated Vital Signs BP (!) 158/72 (BP Location: Right Arm)   Pulse 79   Temp 97.7 F (36.5 C) (Oral)   Resp 13   SpO2 100%   Physical Exam Vitals and nursing note reviewed.  Constitutional:      General: He is not in acute distress.    Appearance: He is well-developed.     Comments: Disheveled  HENT:     Head: Normocephalic and atraumatic.     Mouth/Throat:     Mouth: Mucous membranes are dry.     Pharynx: No oropharyngeal exudate.  Eyes:     Conjunctiva/sclera: Conjunctivae normal.     Pupils: Pupils are equal, round, and reactive to light.  Neck:     Comments: No meningismus. Cardiovascular:     Rate and Rhythm: Normal rate and regular rhythm.     Heart sounds: Normal heart sounds. No murmur.  Pulmonary:     Effort: Pulmonary effort is normal. No respiratory distress.     Breath sounds: Normal breath sounds.  Abdominal:     Palpations: Abdomen is soft.     Tenderness: There is abdominal tenderness. There is no guarding or rebound.     Comments: Mild diffuse tenderness  Musculoskeletal:        General: No tenderness. Normal range of motion.     Cervical back: Normal range of motion and neck supple.  Skin:    General: Skin is warm.  Neurological:     Mental Status: He is alert and oriented to person, place, and time.     Cranial Nerves: No cranial nerve deficit.     Motor: No abnormal muscle tone.  Coordination: Coordination normal.     Comments: 5/5 strength throughout. No facial asymmetry  Psychiatric:        Behavior: Behavior normal.     ED Results / Procedures / Treatments   Labs (all labs ordered are listed, but only abnormal results are displayed) Labs Reviewed  URINALYSIS, ROUTINE W REFLEX MICROSCOPIC - Abnormal; Notable for the following components:      Result Value   Color, Urine STRAW (*)    Glucose, UA >=500 (*)    Ketones, ur 5  (*)    Protein, ur >=300 (*)    All other components within normal limits  CBC WITH DIFFERENTIAL/PLATELET - Abnormal; Notable for the following components:   Hemoglobin 12.1 (*)    HCT 37.7 (*)    RDW 17.0 (*)    Basophils Absolute 0.2 (*)    All other components within normal limits  COMPREHENSIVE METABOLIC PANEL - Abnormal; Notable for the following components:   Glucose, Bld 377 (*)    Albumin 3.3 (*)    All other components within normal limits  RAPID URINE DRUG SCREEN, HOSP PERFORMED - Abnormal; Notable for the following components:   Tetrahydrocannabinol POSITIVE (*)    All other components within normal limits  CBG MONITORING, ED - Abnormal; Notable for the following components:   Glucose-Capillary 351 (*)    All other components within normal limits  CBG MONITORING, ED - Abnormal; Notable for the following components:   Glucose-Capillary 239 (*)    All other components within normal limits  CULTURE, BLOOD (ROUTINE X 2)  CULTURE, BLOOD (ROUTINE X 2)  LIPASE, BLOOD  LACTIC ACID, PLASMA  BLOOD GAS, VENOUS  POC SARS CORONAVIRUS 2 AG -  ED  TROPONIN I (HIGH SENSITIVITY)  TROPONIN I (HIGH SENSITIVITY)    EKG EKG Interpretation  Date/Time:  2019/05/30 09:15:23 EST Ventricular Rate:  80 PR Interval:    QRS Duration: 93 QT Interval:  397 QTC Calculation: 458 R Axis:   -54 Text Interpretation: Sinus rhythm Consider right atrial enlargement Left anterior fascicular block Anterior infarct, old No significant change was found Confirmed by Ezequiel Essex [phone]) on May 30, 2019 10:41:38 AM   Radiology CT ABDOMEN PELVIS W CONTRAST  Result Date: 05/30/2019 CLINICAL DATA:  Abdominal pain and hyperglycemia. Negative COVID-19 test. EXAM: CT ABDOMEN AND PELVIS WITH CONTRAST TECHNIQUE: Multidetector CT imaging of the abdomen and pelvis was performed using the standard protocol following bolus administration of intravenous contrast. CONTRAST:  163m OMNIPAQUE IOHEXOL 300  MG/ML  SOLN COMPARISON:  11/11/2018 FINDINGS: Lower chest: Lung bases are clear. Calcification of the mitral valve annulus is present. There is mild circumferential Rehfeldt thickening over the distal esophagus likely due to gastroesophageal reflux as this is unchanged. Hepatobiliary: 3.3 cm peripheral enhancing mass over the lateral segment of the left lobe of the liver unchanged as this fills in on the delayed images and is compatible with a hemangioma. 1 cm hypodensity over the right lobe of the liver posteriorly unchanged likely a cyst or hemangioma. Biliary tree is within normal. Gallbladder is unremarkable. Pancreas: No focal abnormality. Spleen: Several calcified granulomas. Adrenals/Urinary Tract: Adrenal glands are normal. Kidneys are normal in size with suggestion mild bilateral nephrolithiasis versus vascular calcifications. Resolution of the previously seen mild bilateral hydronephrosis. Ureters are normal. Mild bladder distention. Stomach/Bowel: Stomach and small bowel are unremarkable. Colon is within normal. Appendix is normal. Vascular/Lymphatic: Mild calcified plaque over the abdominal aorta which is normal in caliber. Calcified plaque over the iliac  and femoral arteries. No adenopathy. Reproductive: Normal. Other: No free peritoneal air fluid or free peritoneal air. No focal inflammatory change. Musculoskeletal: Mild degenerative change of the spine and hips. IMPRESSION: 1.  No acute findings in the abdomen/pelvis. 2. Stable 3.3 cm mass over the left lobe of the liver typical in appearance for a hemangioma. Subcentimeter hypodensity over the right lobe of the liver unchanged likely a cyst or hemangioma. 3. Kidneys normal in size with suggestion of small bilateral stones versus vascular calcifications. No hydronephrosis. 4.  Aortic Atherosclerosis (ICD10-I70.0). 5. Stable mild circumferential Daggs thickening of the distal esophagus likely due to reflux esophagitis. Electronically Signed   By: Marin Olp M.D.   On: 05/22/19 13:51   DG Chest Portable 1 View  Result Date: 05-22-19 CLINICAL DATA:  Weakness, hyperglycemia EXAM: PORTABLE CHEST 1 VIEW COMPARISON:  05/08/2019 and CT chest 03/31/2019. FINDINGS: Trachea is midline. Heart size normal. Lungs are hyperinflated but clear. Previously seen right upper lobe airspace consolidation has completely cleared. No pleural fluid. IMPRESSION: No acute findings. Electronically Signed   By: Lorin Picket M.D.   On: 22-May-2019 11:47    Procedures Procedures (including critical care time)  Medications Ordered in ED Medications  sodium chloride (PF) 0.9 % injection (has no administration in time range)  sodium chloride 0.9 % bolus 1,000 mL (0 mLs Intravenous Stopped May 22, 2019 1308)  insulin aspart (novoLOG) injection 5 Units (5 Units Subcutaneous Given May 22, 2019 1105)  iohexol (OMNIPAQUE) 300 MG/ML solution 100 mL (100 mLs Intravenous Contrast Given 2019/05/22 1316)    ED Course  I have reviewed the triage vital signs and the nursing notes.  Pertinent labs & imaging results that were available during my care of the patient were reviewed by me and considered in my medical decision making (see chart for details).    MDM Rules/Calculators/A&P                      Patient here with hyperglycemia and not having his medications.  Also has homelessness  Patient found to have hyperglycemia without evidence of DKA.  Creatinine is normal. Patient did have EGD at beginning of January for difficulty swallowing and had dilation at that time.  Social work will be consulted to assist patient with medications. IVF and symptom control given.   Workup reassuring. troponins remain negative. CT a/p reassuring.   Patient seen by SW and CM. They have contacted his hotel where has been staying. Will need refills of his medications including insulin as the hotel apparently disposed of them.  He denies suicidal or homicidal thoughts.  Will be given refills of  his insulin and other home meds.  Tolerating PO. Seen by SW and CM.   [name] was evaluated in Emergency Department on 22-May-2019 for the symptoms described in the history of present illness. He was evaluated in the context of the global COVID-19 pandemic, which necessitated consideration that the patient might be at risk for infection with the SARS-CoV-2 virus that causes COVID-19. Institutional protocols and algorithms that pertain to the evaluation of patients at risk for COVID-19 are in a state of rapid change based on information released by regulatory bodies including the CDC and federal and state organizations. These policies and algorithms were followed during the patient's care in the ED.   Final Clinical Impression(s) / ED Diagnoses Final diagnoses:  Hyperglycemia    Rx / DC Orders ED Discharge Orders    None  Ezequiel Essex, MD 18-May-2019 2042

## 2019-05-17 NOTE — ED Notes (Signed)
When CT tech came to get patient for scan, he refused because he wanted something to drink prior to scan. Went to bedside, explained to patient he could have anything to eat or drink till CT results were back to the provider, which usually takes 30-45 minutes after the scan. Patient agreed to have the CT. Informed CT that patient is ready. There were two people in front of him since he declined first.
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
## 2019-05-17 NOTE — Progress Notes (Signed)
CSW received a call from [facility] RN CM about pt.  Pt is well-known to the [facility] ED and this Probation officer.  Pt has a son who was supportive in the past and with whom the pt has lived.  Pt's son has in the past provided transportation for the pt and assist with the pt's medication needs by driving pt to the pharmacist.  On pt's last visit where the CSW intervened the CSW provided pt with his son's phone number as the pt did not have it with him then and presented as reticent to call.  Upon chart review pt's son's phone number is at ph: ph: [phone]. CSW to provide RN CM this hand out to provide to the pt (see below).  CSW sought this information today by calling [employer] at ph: [phone] at ext [phone] and speaking to [facility].  Handout:  You have two options for Tuesday 1/26.     Tomorrow morning, please arrive to the [employer]  [place] Education officer, museum states that if you arrive tomorrow morning you can have begin the process to seek shelter and quarantine for an extended period of time.      On December 26th (Tuesday) and Monday through Friday you can go to the [employer] [facility] to also seek admittance into a hotel quarantine program   For Option 2 please go the [facility] tomorrow between 8am-1pm (the earlier the better for your chances of success) for medical and housing assistance and ask to see:   1.      [place] with the medical team to be connected for medical services and free needed medications and .. 2.       3.     The PATH team.  The P.A.T.H. team conducts outreach to clients to see patient's meet criteria for severe and persistent mental illness (or co-occurring disorders) and chronic homelessness and once they are enrolled then the PATH team conducts assertive case management with the patient by connecting them to mental health, primary care, help with clothing, food, Medicaid applications, etc.  The P.A.T.H. team also  conducts housing assessments and submits them to [facility] and advocates for the patient to receive vouchers for permanent housing and in the meantime provides transport or provides bus passes when appropriate. 3. A worker about the [employer].  Your son's phone # is at ph:  [phone].  CSW will continue to follow for D/C needs.  Alphonse Guild. Roneisha Stern, LCSW, LCAS, CSI Transitions of Care Clinical Social Worker Care Coordination Department Ph: [phone]     ]

## 2019-05-17 NOTE — ED Notes (Signed)
Patient refused to allow RN to complete the discharge process.

## 2019-05-17 NOTE — TOC Initial Note (Addendum)
Transition of Care [facility]) - Initial/Assessment Note    Patient Details  Name: [name] MRN: [id number] Date of Birth: Aug 06, 1955  [facility]) CM/SW Contact:    Erenest Rasher, RN Phone Number: [phone] 06/01/2019, 3:14 PM  Clinical Narrative:                 Spoke to pt and states he was staying at the [facility], but currently does not have funds to return. States they keep his belongings because he owes the hotel $50. Gave CM permission to call to check on his belongings and medications. [place] and they did not have at front desk. Receptionist states manager would not return until tomorrow at 7 am. Spoke to ED provided requested refill for his meds. Will contact [facility] for bed availability as pt is currently homeless. Pt states he would need transportation. He doesn't want to go back to street, states he slept in a tent last night.   05/11/2019 4:30 TOC CM provided pt with resources given by CSW, Roderic Palau. Explained to pt that he needed to follow up with [facility] in am. Provided pt with two bus passes. Pt states he was going to a friend's home to take and shower. Pt on phone with friend. Will use second bus to go to get [employer] in am.   Expected Discharge Plan: Homeless Shelter Barriers to Discharge: No Barriers Identified   Patient Goals and CMS Choice        Expected Discharge Plan and Services Expected Discharge Plan: Homeless Shelter In-house Referral: Clinical Social Work Discharge Planning Services: CM Consult   Living arrangements for the past 2 months: Homeless Shelter                                      Prior Living Arrangements/Services Living arrangements for the past 2 months: Homeless Shelter Lives with:: Self Patient language and need for interpreter reviewed:: Yes Do you feel safe going back to the place where you live?: Yes      Need for Family Participation in Patient Care: No  (Comment) Care giver support system in place?: No (comment) Current home services: DME(rolling walker) Criminal Activity/Legal Involvement Pertinent to Current Situation/Hospitalization: No - Comment as needed  Activities of Daily Living      Permission Sought/Granted Permission sought to share information with : Case Manager, PCP, Family Supports Permission granted to share information with : Yes, Verbal Permission Granted              Emotional Assessment Appearance:: Appears stated age Attitude/Demeanor/Rapport: Guarded Affect (typically observed): Agitated Orientation: : Oriented to Self, Oriented to Place, Oriented to Situation, Oriented to  Time   Psych Involvement: No (comment)  Admission diagnosis:  dizziness Patient Active Problem List   Diagnosis Date Noted  . Hypoglycemia due to type 1 diabetes mellitus ([place]) 05/06/2019  . Nausea and vomiting 05/06/2019  . COPD with acute bronchitis ([place]) 05/06/2019  . Esophageal stricture   . Erosive esophagitis   . CKD (chronic kidney disease), stage III 03/31/2019  . Normocytic anemia 03/31/2019  . Otitis media 03/31/2019  . Homelessness 03/31/2019  . Adjustment disorder with mixed disturbance of emotions and conduct   . Sepsis due to pneumonia ([place]) 11/11/2018  . Hyperkalemia 11/11/2018  . Esophageal dysphagia 10/13/2018  . Acute esophagitis 10/13/2018  . Loose  stools 10/13/2018  . Routine general medical examination at a health care facility 10/13/2018  . Left lower lobe pneumonia 10/07/2018  . Acute respiratory failure ([place])   . AMS (altered mental status)   . Gastroesophageal reflux disease   . Hallucinations   . Hypoglycemia associated with diabetes ([place]) 08/12/2018  . At risk for adverse drug event 02/17/2018  . Abrasions of multiple sites   . Demand ischemia of myocardium ([place]) 02/05/2018  . Acute metabolic encephalopathy 10/13/1599  . DKA (diabetic ketoacidoses) ([place]) 02/03/2018  . Leukocytosis 02/03/2018   . Hypoglycemia 12/24/2017  . Syncope 12/24/2017  . Syncope and collapse 12/23/2017  . IV drug abuse ([place]) 04/19/2017  . Acute encephalopathy 04/19/2017  . Hemangioma 10/02/2016  . Foreign body (FB) in soft tissue   . 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[place])   . Gangrene ([place])   . Type I (juvenile type) diabetes mellitus without mention of complication, not stated as uncontrolled   . Tobacco abuse   . Essential hypertension   . Diabetic infection of right foot ([place]) 07/22/2016  . Diabetic foot infection ([place]) 07/22/2016  . Chronic ulcer of heel, right, with fat layer exposed ([place]) 05/20/2016  . DKA, type 1 ([place]) 04/18/2016  . AKI (acute kidney injury) ([place]) 04/18/2016  . Hyponatremia 04/18/2016  . Severe protein-calorie malnutrition ([place]) 04/18/2016  . Elevated troponin 04/18/2016  . Poor dentition 07/12/2015  . Tobacco use 07/06/2013  . PAD (peripheral artery disease) ([place]) 04/29/2013  . COPD, mild ([place])   . 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 Diabetic neuropathy ([place]) 06/21/2010  . PROLIFERATIVE DIABETIC RETINOPATHY 06/21/2010  . SKIN TAG 01/30/2010  . Chronic hepatitis C ([place]) 11/22/2008  . VITILIGO 11/22/2008  . PROTEINURIA, MILD 11/22/2008  . Substance abuse ([place]) 04/23/2007  . DEPRESSION 11/11/2006   PCP:  Patient, No Pcp Per Pharmacy:   [facility] [address], [place] [address] [place] [place] [address] Phone: [phone] Fax: [place], [place] [address] [place] [address] Phone: [phone] Fax: [phone]     Social Determinants of Health (SDOH) Interventions    Readmission Risk Interventions Readmission Risk Prevention Plan 08/12/2018  Transportation Screening Complete  Medication Review ([place]) Complete  PCP or Specialist appointment within 3-5 days of discharge  Complete  HRI or [place] Complete  SW Recovery Care/Counseling Consult Complete  [place] Not Applicable  Some recent data might be hidden

## 2019-05-17 DEATH — deceased

## 2019-08-05 IMAGING — RF DG SWALLOWING FUNCTION - NRPT [facility]
13 of 24 series · 13 of 24 positions shown · non-contrast
Comparison: none

[Series 1: run · 1 of 145 frames shown (1 of 13)]
[frame 17/145]
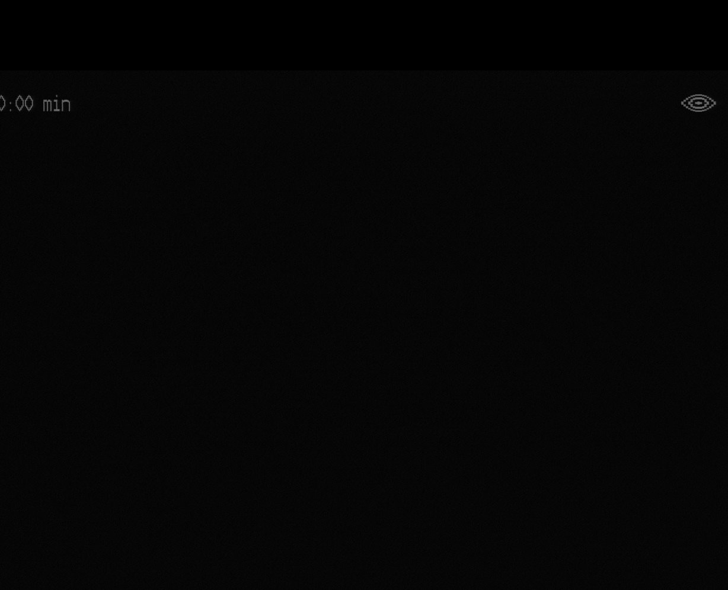

[Series 3: run · 1 of 170 frames shown (2 of 13)]
[frame 7/170]
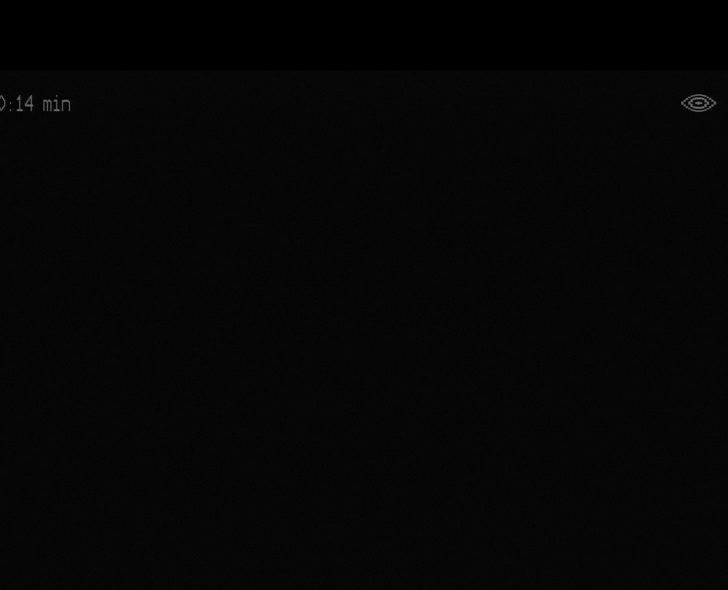

[Series 5: run · 1 of 356 frames shown (3 of 13)]
[frame 54/356]
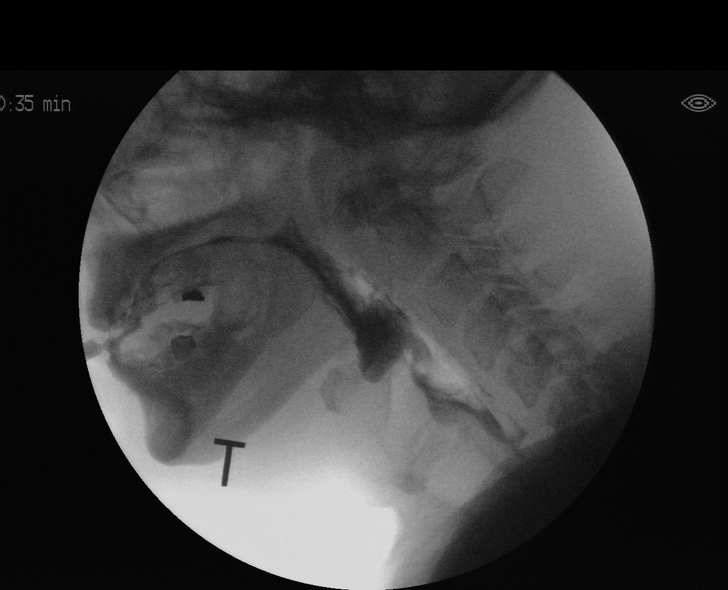

[Series 7: run · 1 of 193 frames shown (4 of 13)]
[frame 29/193]
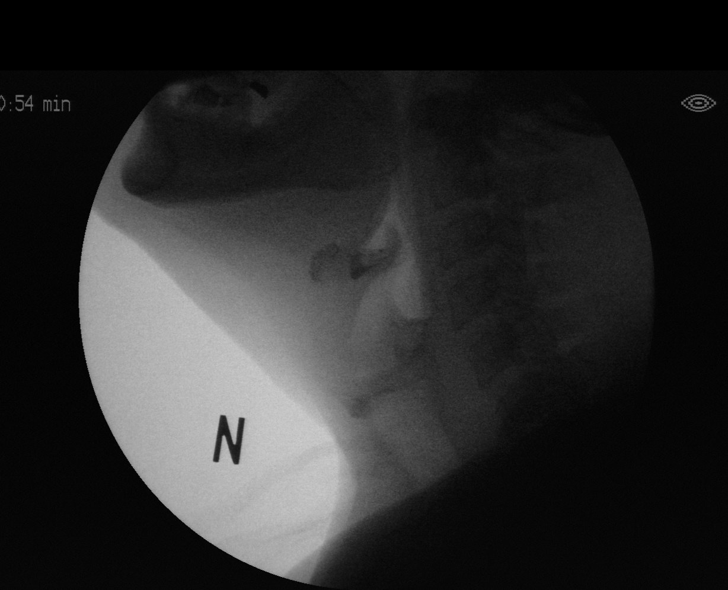

[Series 9: run · 1 of 64 frames shown (5 of 13)]
[frame 10/64]
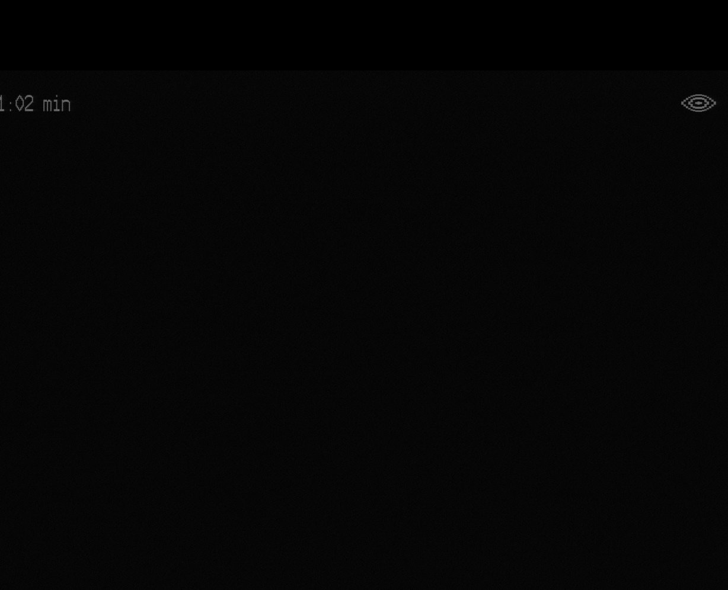

[Series 11: run · 1 of 233 frames shown (6 of 13)]
[frame 35/233]
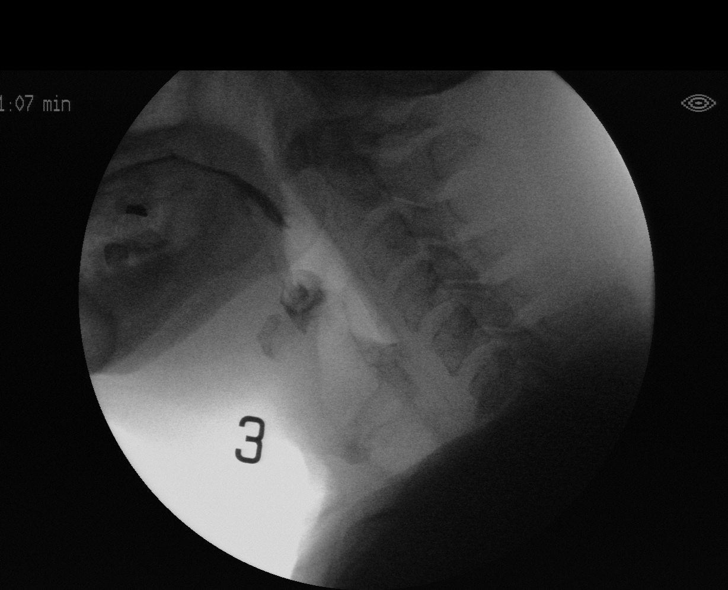

[Series 13: run · 1 of 327 frames shown (7 of 13)]
[frame 164/327]
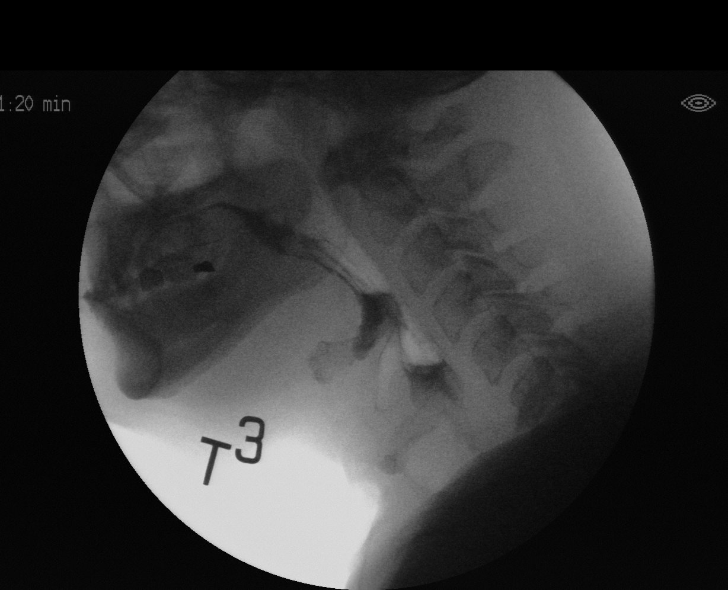

[Series 14: run · 1 of 139 frames shown (8 of 13)]
[frame 70/139]
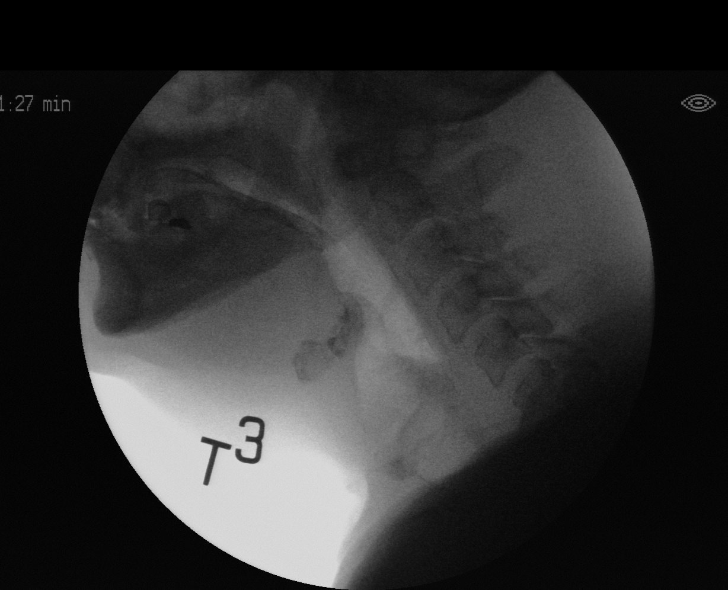

[Series 16: run · 1 of 308 frames shown (9 of 13)]
[frame 155/308]
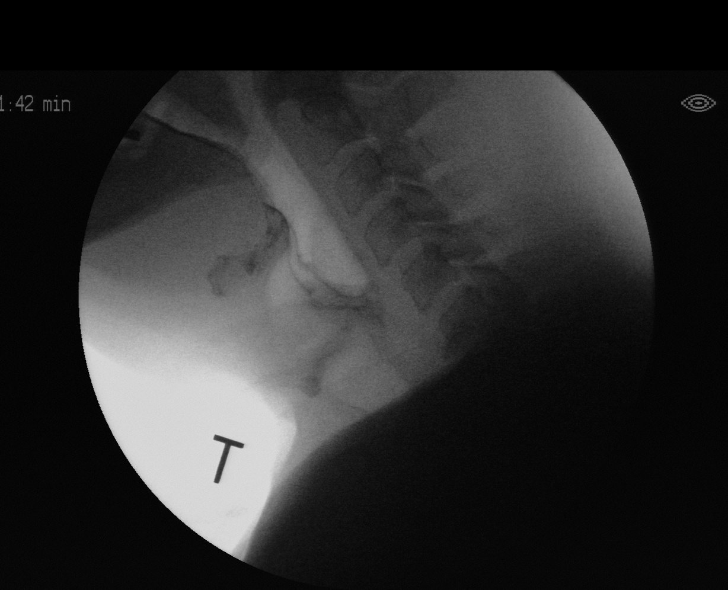

[Series 18: run · 1 of 187 frames shown (10 of 13)]
[frame 94/187]
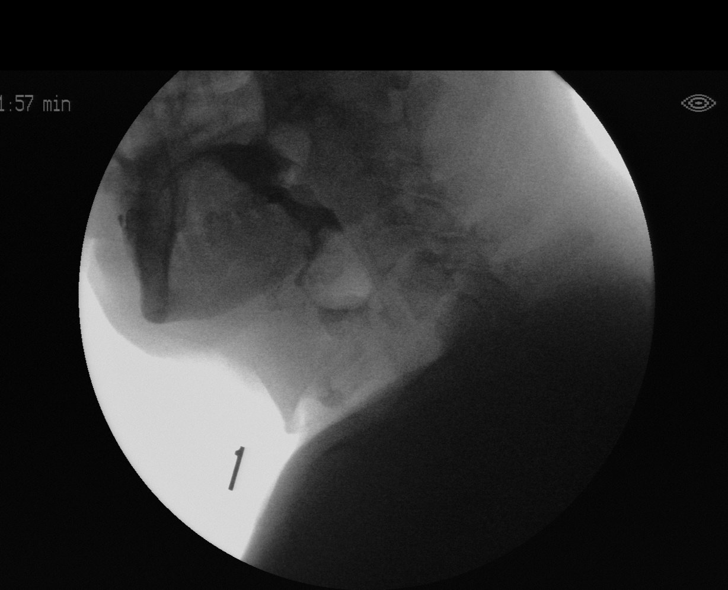

[Series 20: run · 1 of 133 frames shown (11 of 13)]
[frame 67/133]
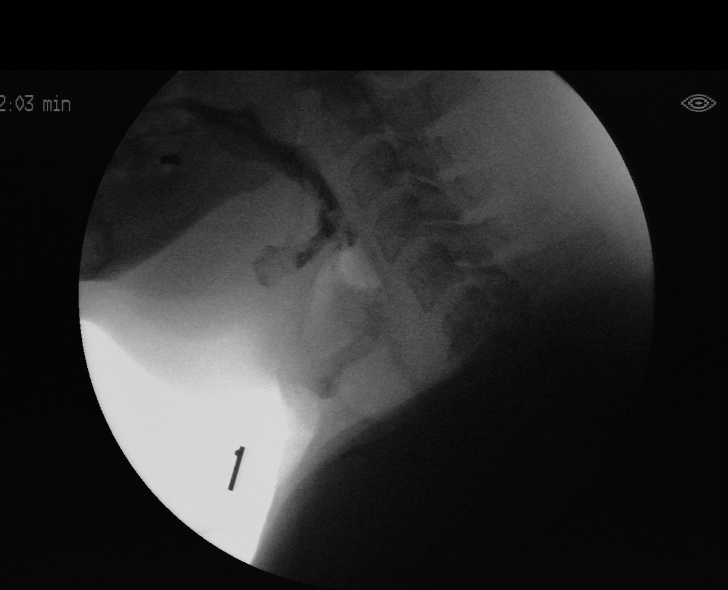

[Series 22: run · 1 of 61 frames shown (12 of 13)]
[frame 52/61]
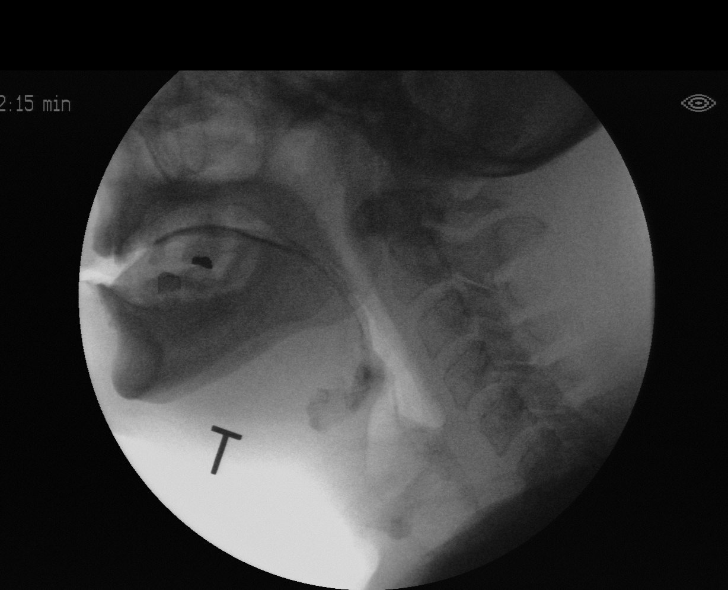

[Series 24: run · 1 of 37 frames shown (13 of 13)]
[frame 32/37]
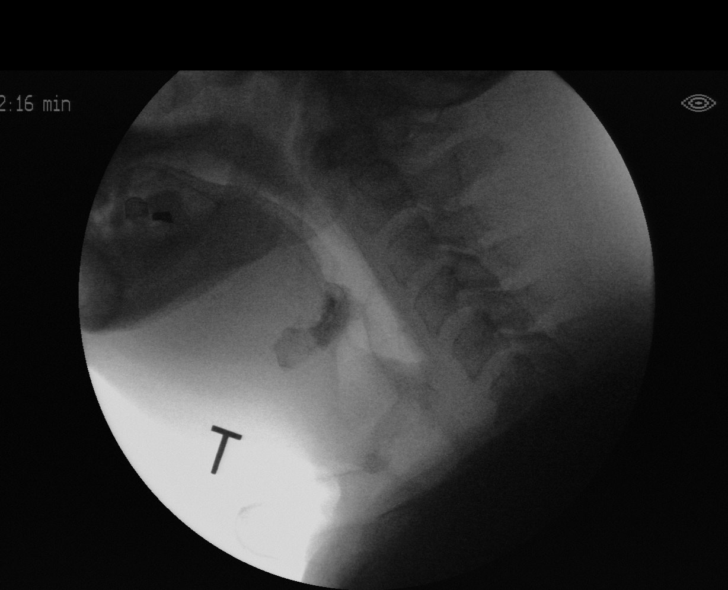

[13 of 24 positions shown; findings below may reference images not displayed]

FLUOROSCOPY FOR SWALLOWING FUNCTION STUDY:
Fluoroscopy was provided for swallowing function study, which was administered by a speech pathologist.  Final results and recommendations from this study are contained within the speech pathology report.
